# Patient Record
Sex: Female | Born: 1955 | Race: White | Hispanic: No | Marital: Married | Smoking: Former smoker
Health system: Southern US, Community
[De-identification: ages and names within clinical notes are randomized; demographics above are authoritative.]

## PROBLEM LIST (undated history)

## (undated) DIAGNOSIS — G459 Transient cerebral ischemic attack, unspecified: Secondary | ICD-10-CM

## (undated) DIAGNOSIS — K0401 Reversible pulpitis: Secondary | ICD-10-CM

## (undated) DIAGNOSIS — M359 Systemic involvement of connective tissue, unspecified: Secondary | ICD-10-CM

## (undated) DIAGNOSIS — Z9889 Other specified postprocedural states: Secondary | ICD-10-CM

## (undated) DIAGNOSIS — R42 Dizziness and giddiness: Secondary | ICD-10-CM

## (undated) DIAGNOSIS — M2142 Flat foot [pes planus] (acquired), left foot: Secondary | ICD-10-CM

## (undated) DIAGNOSIS — F319 Bipolar disorder, unspecified: Secondary | ICD-10-CM

## (undated) DIAGNOSIS — K7460 Unspecified cirrhosis of liver: Secondary | ICD-10-CM

## (undated) DIAGNOSIS — F32A Depression, unspecified: Secondary | ICD-10-CM

## (undated) DIAGNOSIS — K469 Unspecified abdominal hernia without obstruction or gangrene: Secondary | ICD-10-CM

## (undated) DIAGNOSIS — M1990 Unspecified osteoarthritis, unspecified site: Secondary | ICD-10-CM

## (undated) DIAGNOSIS — F1010 Alcohol abuse, uncomplicated: Secondary | ICD-10-CM

## (undated) DIAGNOSIS — K4090 Unilateral inguinal hernia, without obstruction or gangrene, not specified as recurrent: Secondary | ICD-10-CM

## (undated) DIAGNOSIS — I10 Essential (primary) hypertension: Secondary | ICD-10-CM

## (undated) DIAGNOSIS — D649 Anemia, unspecified: Secondary | ICD-10-CM

## (undated) DIAGNOSIS — K589 Irritable bowel syndrome without diarrhea: Secondary | ICD-10-CM

## (undated) DIAGNOSIS — K219 Gastro-esophageal reflux disease without esophagitis: Secondary | ICD-10-CM

## (undated) DIAGNOSIS — F410 Panic disorder [episodic paroxysmal anxiety] without agoraphobia: Secondary | ICD-10-CM

## (undated) DIAGNOSIS — R569 Unspecified convulsions: Secondary | ICD-10-CM

## (undated) DIAGNOSIS — I671 Cerebral aneurysm, nonruptured: Secondary | ICD-10-CM

## (undated) DIAGNOSIS — L4050 Arthropathic psoriasis, unspecified: Secondary | ICD-10-CM

## (undated) DIAGNOSIS — T8859XA Other complications of anesthesia, initial encounter: Secondary | ICD-10-CM

## (undated) DIAGNOSIS — M3500 Sicca syndrome, unspecified: Secondary | ICD-10-CM

## (undated) DIAGNOSIS — R5383 Other fatigue: Secondary | ICD-10-CM

## (undated) DIAGNOSIS — D500 Iron deficiency anemia secondary to blood loss (chronic): Secondary | ICD-10-CM

## (undated) DIAGNOSIS — E538 Deficiency of other specified B group vitamins: Secondary | ICD-10-CM

## (undated) DIAGNOSIS — R112 Nausea with vomiting, unspecified: Secondary | ICD-10-CM

## (undated) DIAGNOSIS — F419 Anxiety disorder, unspecified: Secondary | ICD-10-CM

## (undated) DIAGNOSIS — T4145XA Adverse effect of unspecified anesthetic, initial encounter: Secondary | ICD-10-CM

## (undated) DIAGNOSIS — S53106A Unspecified dislocation of unspecified ulnohumeral joint, initial encounter: Secondary | ICD-10-CM

## (undated) DIAGNOSIS — M7918 Myalgia, other site: Secondary | ICD-10-CM

## (undated) DIAGNOSIS — F329 Major depressive disorder, single episode, unspecified: Secondary | ICD-10-CM

## (undated) HISTORY — DX: Sjogren syndrome, unspecified: M35.00

## (undated) HISTORY — DX: Deficiency of other specified B group vitamins: E53.8

## (undated) HISTORY — DX: Major depressive disorder, single episode, unspecified: F32.9

## (undated) HISTORY — DX: Systemic involvement of connective tissue, unspecified: M35.9

## (undated) HISTORY — DX: Arthropathic psoriasis, unspecified: L40.50

## (undated) HISTORY — DX: Myalgia, other site: M79.18

## (undated) HISTORY — DX: Alcohol abuse, uncomplicated: F10.10

## (undated) HISTORY — DX: Reversible pulpitis: K04.01

## (undated) HISTORY — DX: Iron deficiency anemia secondary to blood loss (chronic): D50.0

## (undated) HISTORY — DX: Essential (primary) hypertension: I10

## (undated) HISTORY — DX: Anemia, unspecified: D64.9

## (undated) HISTORY — DX: Depression, unspecified: F32.A

## (undated) HISTORY — DX: Unspecified dislocation of unspecified ulnohumeral joint, initial encounter: S53.106A

## (undated) HISTORY — DX: Unilateral inguinal hernia, without obstruction or gangrene, not specified as recurrent: K40.90

## (undated) MED FILL — Iron Sucrose Inj 20 MG/ML (Fe Equiv): INTRAVENOUS | Qty: 15 | Status: AC

---

## 1995-03-31 DIAGNOSIS — R569 Unspecified convulsions: Secondary | ICD-10-CM

## 1995-03-31 HISTORY — DX: Unspecified convulsions: R56.9

## 1995-03-31 HISTORY — PX: CEREBRAL ANEURYSM REPAIR: SHX164

## 1997-08-20 ENCOUNTER — Ambulatory Visit (HOSPITAL_COMMUNITY): Admission: RE | Admit: 1997-08-20 | Discharge: 1997-08-20 | Payer: Self-pay | Admitting: Neurological Surgery

## 1998-08-08 ENCOUNTER — Ambulatory Visit (HOSPITAL_COMMUNITY): Admission: RE | Admit: 1998-08-08 | Discharge: 1998-08-08 | Payer: Self-pay | Admitting: Neurological Surgery

## 1998-08-08 ENCOUNTER — Encounter: Payer: Self-pay | Admitting: Neurological Surgery

## 2001-12-21 ENCOUNTER — Ambulatory Visit (HOSPITAL_COMMUNITY): Admission: RE | Admit: 2001-12-21 | Discharge: 2001-12-21 | Payer: Self-pay | Admitting: Neurological Surgery

## 2001-12-21 ENCOUNTER — Encounter: Payer: Self-pay | Admitting: Neurological Surgery

## 2002-01-18 ENCOUNTER — Inpatient Hospital Stay (HOSPITAL_COMMUNITY): Admission: RE | Admit: 2002-01-18 | Discharge: 2002-01-19 | Payer: Self-pay | Admitting: Interventional Radiology

## 2002-03-09 ENCOUNTER — Encounter: Payer: Self-pay | Admitting: Rheumatology

## 2002-03-09 ENCOUNTER — Ambulatory Visit (HOSPITAL_COMMUNITY): Admission: RE | Admit: 2002-03-09 | Discharge: 2002-03-09 | Payer: Self-pay | Admitting: Rheumatology

## 2002-04-27 ENCOUNTER — Ambulatory Visit (HOSPITAL_COMMUNITY): Admission: AD | Admit: 2002-04-27 | Discharge: 2002-04-27 | Payer: Self-pay | Admitting: Interventional Radiology

## 2002-11-08 ENCOUNTER — Encounter: Payer: Self-pay | Admitting: Family Medicine

## 2002-11-08 ENCOUNTER — Ambulatory Visit (HOSPITAL_COMMUNITY): Admission: RE | Admit: 2002-11-08 | Discharge: 2002-11-08 | Payer: Self-pay | Admitting: Family Medicine

## 2002-11-21 ENCOUNTER — Ambulatory Visit (HOSPITAL_COMMUNITY): Admission: RE | Admit: 2002-11-21 | Discharge: 2002-11-21 | Payer: Self-pay | Admitting: Family Medicine

## 2002-11-21 ENCOUNTER — Encounter: Payer: Self-pay | Admitting: Family Medicine

## 2002-11-27 ENCOUNTER — Ambulatory Visit (HOSPITAL_COMMUNITY): Admission: RE | Admit: 2002-11-27 | Discharge: 2002-11-27 | Payer: Self-pay | Admitting: Interventional Radiology

## 2002-11-30 ENCOUNTER — Ambulatory Visit (HOSPITAL_COMMUNITY): Admission: RE | Admit: 2002-11-30 | Discharge: 2002-11-30 | Payer: Self-pay | Admitting: Interventional Radiology

## 2002-12-01 ENCOUNTER — Ambulatory Visit (HOSPITAL_COMMUNITY): Admission: RE | Admit: 2002-12-01 | Discharge: 2002-12-01 | Payer: Self-pay | Admitting: Interventional Radiology

## 2002-12-26 ENCOUNTER — Ambulatory Visit (HOSPITAL_COMMUNITY): Admission: RE | Admit: 2002-12-26 | Discharge: 2002-12-27 | Payer: Self-pay | Admitting: Interventional Radiology

## 2003-04-06 ENCOUNTER — Ambulatory Visit (HOSPITAL_COMMUNITY): Admission: RE | Admit: 2003-04-06 | Discharge: 2003-04-06 | Payer: Self-pay | Admitting: Interventional Radiology

## 2003-10-10 ENCOUNTER — Ambulatory Visit (HOSPITAL_COMMUNITY): Admission: RE | Admit: 2003-10-10 | Discharge: 2003-10-10 | Payer: Self-pay | Admitting: Interventional Radiology

## 2004-06-12 ENCOUNTER — Ambulatory Visit (HOSPITAL_COMMUNITY): Admission: RE | Admit: 2004-06-12 | Discharge: 2004-06-12 | Payer: Self-pay | Admitting: Obstetrics & Gynecology

## 2004-11-17 ENCOUNTER — Encounter: Payer: Self-pay | Admitting: Interventional Radiology

## 2004-12-16 ENCOUNTER — Ambulatory Visit (HOSPITAL_COMMUNITY): Admission: RE | Admit: 2004-12-16 | Discharge: 2004-12-17 | Payer: Self-pay | Admitting: Interventional Radiology

## 2004-12-25 ENCOUNTER — Ambulatory Visit (HOSPITAL_COMMUNITY): Admission: RE | Admit: 2004-12-25 | Discharge: 2004-12-25 | Payer: Self-pay | Admitting: Interventional Radiology

## 2004-12-31 ENCOUNTER — Encounter: Payer: Self-pay | Admitting: Interventional Radiology

## 2005-11-13 ENCOUNTER — Encounter (INDEPENDENT_AMBULATORY_CARE_PROVIDER_SITE_OTHER): Payer: Self-pay | Admitting: *Deleted

## 2005-11-13 ENCOUNTER — Ambulatory Visit: Admission: RE | Admit: 2005-11-13 | Discharge: 2005-11-13 | Payer: Self-pay | Admitting: Family Medicine

## 2005-11-16 ENCOUNTER — Ambulatory Visit (HOSPITAL_COMMUNITY): Admission: RE | Admit: 2005-11-16 | Discharge: 2005-11-16 | Payer: Self-pay | Admitting: Interventional Radiology

## 2006-10-12 ENCOUNTER — Ambulatory Visit (HOSPITAL_COMMUNITY): Admission: RE | Admit: 2006-10-12 | Discharge: 2006-10-12 | Payer: Self-pay | Admitting: Family Medicine

## 2006-12-16 ENCOUNTER — Ambulatory Visit: Payer: Self-pay | Admitting: Orthopedic Surgery

## 2006-12-17 ENCOUNTER — Encounter: Payer: Self-pay | Admitting: Orthopedic Surgery

## 2007-02-02 ENCOUNTER — Encounter: Payer: Self-pay | Admitting: Orthopedic Surgery

## 2007-02-03 ENCOUNTER — Ambulatory Visit: Payer: Self-pay | Admitting: Orthopedic Surgery

## 2007-02-03 DIAGNOSIS — S53106A Unspecified dislocation of unspecified ulnohumeral joint, initial encounter: Secondary | ICD-10-CM

## 2007-02-03 DIAGNOSIS — M79609 Pain in unspecified limb: Secondary | ICD-10-CM | POA: Insufficient documentation

## 2007-02-03 HISTORY — DX: Unspecified dislocation of unspecified ulnohumeral joint, initial encounter: S53.106A

## 2007-02-16 ENCOUNTER — Encounter: Payer: Self-pay | Admitting: Orthopedic Surgery

## 2007-04-13 ENCOUNTER — Encounter: Payer: Self-pay | Admitting: Orthopedic Surgery

## 2007-08-01 ENCOUNTER — Other Ambulatory Visit: Admission: RE | Admit: 2007-08-01 | Discharge: 2007-08-01 | Payer: Self-pay | Admitting: Obstetrics & Gynecology

## 2007-08-03 ENCOUNTER — Ambulatory Visit (HOSPITAL_COMMUNITY): Admission: RE | Admit: 2007-08-03 | Discharge: 2007-08-03 | Payer: Self-pay | Admitting: Obstetrics & Gynecology

## 2007-08-09 ENCOUNTER — Encounter: Payer: Self-pay | Admitting: Orthopedic Surgery

## 2009-01-07 ENCOUNTER — Other Ambulatory Visit: Admission: RE | Admit: 2009-01-07 | Discharge: 2009-01-07 | Payer: Self-pay | Admitting: Obstetrics & Gynecology

## 2009-01-14 ENCOUNTER — Ambulatory Visit (HOSPITAL_COMMUNITY): Admission: RE | Admit: 2009-01-14 | Discharge: 2009-01-14 | Payer: Self-pay | Admitting: Obstetrics & Gynecology

## 2010-01-11 ENCOUNTER — Emergency Department (HOSPITAL_COMMUNITY): Admission: EM | Admit: 2010-01-11 | Discharge: 2010-01-11 | Payer: Self-pay | Admitting: Emergency Medicine

## 2010-01-11 ENCOUNTER — Encounter: Payer: Self-pay | Admitting: Orthopedic Surgery

## 2010-01-14 ENCOUNTER — Ambulatory Visit: Payer: Self-pay | Admitting: Orthopedic Surgery

## 2010-01-14 DIAGNOSIS — S92309A Fracture of unspecified metatarsal bone(s), unspecified foot, initial encounter for closed fracture: Secondary | ICD-10-CM | POA: Insufficient documentation

## 2010-01-16 ENCOUNTER — Encounter: Payer: Self-pay | Admitting: Orthopedic Surgery

## 2010-02-26 ENCOUNTER — Ambulatory Visit: Payer: Self-pay | Admitting: Orthopedic Surgery

## 2010-03-04 ENCOUNTER — Ambulatory Visit (HOSPITAL_COMMUNITY)
Admission: RE | Admit: 2010-03-04 | Discharge: 2010-03-04 | Payer: Self-pay | Source: Home / Self Care | Admitting: Rheumatology

## 2010-03-18 ENCOUNTER — Other Ambulatory Visit
Admission: RE | Admit: 2010-03-18 | Discharge: 2010-03-18 | Payer: Self-pay | Source: Home / Self Care | Admitting: Obstetrics & Gynecology

## 2010-03-20 ENCOUNTER — Ambulatory Visit (HOSPITAL_COMMUNITY)
Admission: RE | Admit: 2010-03-20 | Discharge: 2010-03-20 | Payer: Self-pay | Source: Home / Self Care | Attending: Obstetrics & Gynecology | Admitting: Obstetrics & Gynecology

## 2010-04-20 ENCOUNTER — Encounter: Payer: Self-pay | Admitting: Internal Medicine

## 2010-05-01 NOTE — Letter (Signed)
Summary: History form  History form   Imported By: Jacklynn Ganong 01/16/2010 08:15:56  _____________________________________________________________________  External Attachment:    Type:   Image     Comment:   External Document

## 2010-05-01 NOTE — Assessment & Plan Note (Signed)
Summary: NEW PROB/AP ER/FOL/UP/FX RT FOOT,METATARSAL/XRAY [facility] 01/11/10/...   Visit Type:  new problem  CC:  right foot pain.  History of Present Illness: I saw Deanna Thompson in the office today for a  visit.  She is a 55 years old woman with the complaint of:  right foot pain after fall 01/10/10.  Right foot xrays [facility] 01/11/10.  Meds: Metoprolol, Lexapro, Verapamil, Enbrel, Claratin, Ibuprofen 400 as needed helps.  No meds given from er.  55 year old female fell on the 14th went to the hospital for x-rays on the 15th and shows a nondisplaced proximal fifth metatarsal fracture she does complain of some moderate to severe pain, swelling, difficulty ambulating and some pain that radiates proximal and distal to the fracture site.  She was treated with a temporary postop shoe and says that she is still having some discomfort     Allergies: 1)  ! Keflex 2)  ! Dilantin  Past History:  Past Medical History: tendonitis hypertension seasonal allergies psoriatic arthritis PTSD  Family History: Hx, family, asthma  Social History: Patient is married.  retired no smoking some alcohol use no caffeine Masters Degree  Review of Systems Constitutional:  Denies weight loss, weight gain, fever, chills, and fatigue. Cardiovascular:  Denies chest pain, palpitations, fainting, and murmurs. Respiratory:  Complains of snoring; denies short of breath, wheezing, couch, tightness, pain on inspiration, and snoring . Gastrointestinal:  Complains of heartburn; denies nausea, vomiting, diarrhea, constipation, and blood in your stools. Genitourinary:  Complains of urgency; denies frequency, difficulty urinating, painful urination, flank pain, and bleeding in urine. Neurologic:  Denies numbness, tingling, unsteady gait, dizziness, tremors, and seizure. Musculoskeletal:  Complains of joint pain, swelling, and muscle pain; denies instability, stiffness, redness, and heat. Endocrine:  Denies  excessive thirst, exessive urination, and heat or cold intolerance. Psychiatric:  Complains of depression; denies nervousness, anxiety, and hallucinations. Skin:  Denies changes in the skin, poor healing, rash, itching, and redness; psoriasis. HEENT:  Denies blurred or double vision, eye pain, redness, and watering. Immunology:  Complains of seasonal allergies; denies sinus problems and allergic to bee stings. Hemoatologic:  Denies easy bleeding and brusing.  Physical Exam  Additional Exam:  GEN: well developed, well nourished, normal grooming and hygiene, no deformity and normal body habitus.   CDV: pulses are normal, no edema, no erythema. no tenderness  Lymph: normal lymph nodes   Skin: no rashes, skin lesions or open sores   NEURO: normal coordination, reflexes, sensation.   Psyche: awake, alert and oriented. Mood normal   Gait: visit.  She is walking with a postop shoe but she is limping  Inspection shows that she does have tenderness at the proximal fifth metatarsal tarsal with a modest amount of swelling.  She has normal range of motion in her ankle.  No strength deficits are detected and the joints of the ankle and foot are stable       Impression & Recommendations:  Problem # 1:  CLOSED FRACTURE OF METATARSAL BONE (ICD-825.25) Assessment New  hospital film reviewed on the computer nondisplaced fifth metatarsal fracture at the proximal and questionable at the [place] area.  Recommend Cam Walker  Orders: Est. Patient Level IV (95284) Metatarsal Fx (13244)  Patient Instructions: 1)  Please schedule a follow-up appointment in 6 weeks 2)  xrays rt foot    Orders Added: 1)  Est. Patient Level IV [01027] 2)  Metatarsal Fx [25366]

## 2010-05-01 NOTE — Assessment & Plan Note (Signed)
Summary: 6 WK RE-CK/XRAY RT FOOT/FX CARE/UHC/CAF   Visit Type:  Follow-up Referring Provider:  [facility]  CC:  right foot fracture.  History of Present Illness: I saw Deanna Thompson in the office today for a 6 week  followup visit.  She is a 55 years old woman with the complaint of:  right foot fracture.  Xrays today.  Right foot xrays [facility] 01/11/10.  Meds: Metoprolol, Lexapro, Verapamil, Enbrel, Claratin, Ibuprofen 400 as needed helps.  She is now approximately 6 weeks status post treatment with the Cam Walker for a 5th metacarpal. Proximal fracture of the 5th metatarsal bone complains of some mild swelling in tenderness.  she does complain of some soreness in the foot, with some swelling at the end of the day, which eventually goes down, which is elevated. The foot.  X-rays 3 views, RIGHT foot, proximal 5th metatarsal fracture has healed with good callus formation around it in normal alignment.  Impression healed RIGHT 5th metatarsal fracture.  Plan the patient can remove the boot and resume her activities in a progressive manner. She is to call if there are any problems  Allergies: 1)  ! Keflex 2)  ! Dilantin   Impression & Recommendations:  Problem # 1:  CLOSED FRACTURE OF METATARSAL BONE (ICD-825.25) Assessment Improved  Orders: Post-Op Check (16109) Foot x-ray complete, minimum 3 views (60454)  Patient Instructions: 1)  Take boot off 2)  will be sore for a week or 2 3)  come back as needed   Orders Added: 1)  Post-Op Check [99024] 2)  Foot x-ray complete, minimum 3 views [73630]

## 2010-08-15 NOTE — Discharge Summary (Signed)
NAMECATHELINE, Thompson            ACCOUNT NO.:  [id number]   MEDICAL RECORD NO.:  [id number]          PATIENT TYPE:  OIB   LOCATION:  3108                         FACILITY:  [facility]   PHYSICIAN:  Sanjeev K. Deveshwar, M.D.DATE OF BIRTH:  12-28-1955   DATE OF ADMISSION:  12/16/2004  DATE OF DISCHARGE:  12/17/2004                           DISCHARGE SUMMARY - REFERRING   BRIEF HISTORY:  This is a pleasant 55 year old female with a history of a  basilar artery apex aneurysm that was initially treated by Dr. Danielle Dess with  clipping in 1997.  The patient later went to [facility] in  [place] for a coiling of the aneurysm.  She subsequently had recoiling of the  aneurysm performed by Dr. Corliss Skains in October of 2003 and again in  September of 2004.  Her most recent angiogram was April 06, 2003.  At that  time there was no evidence of cannulization of the aneurysm.  The patient  was noted to have another aneurysm approximately 2.5 x 3 mm in left middle  cerebral artery.  The patient returned to [facility] on  December 16, 2004, for a cerebral angiogram with possible coiling of the  aneurysm.  She has essentially been asymptomatic.   PAST MEDICAL HISTORY:  1.  Hypertension.  2.  Irritable bowel syndrome.  3.  History of migraine headaches.  4.  She had a question of a TIA or possible CVA in December.  She was      started on aspirin and Plavix at that time.   PAST SURGICAL HISTORY:  Please see the above noted aneurysm clipping in  1997.   ALLERGIES:  Patient is allergic to [facility] which causes yeast infections.  She had a rash with DILANTIN therapy.  She had projectile vomiting with some  type of ANESTHESIA in the past.  She has been treated with scopolamine  patches with good results.   MEDICATIONS ON ADMISSION:  1.  Lexapro 10 mg daily.  2.  Verapamil 240 mg daily.  3.  Toprol XL 100 mg daily.  4.  Nasonex nasal spray daily.  5.  Claritin  over-the-counter daily.  6.  Tylenol p.r.n.  7.  FiberTabs daily.  8.  She was given aspirin and Plavix on the day of the procedure.   SOCIAL HISTORY:  Patient is married.  She has no children.  She lives in  [place] with her husband.  She quit smoking in 1997.  She smoked  approximately two packs of cigarettes per day for 20 years.  She drinks two  to three glasses of wine per day.  She works for [employer]  in [employer].  She also works part time as a Sport and exercise psychologist.   FAMILY HISTORY:  Patient's mother is alive at age 49.  She has end-stage  cirrhosis and aortic stenosis.  Her father is alive at age 33.  He had an MI  approximately two weeks ago.  He also has a long history of tobacco use.   HOSPITAL COURSE:  As noted, this patient was admitted to [facility] on December 16, 2004, with a known cerebral aneurysm with  plans for a cerebral angiogram and possible coiling of the aneurysm.  The  angiogram was performed on the day of admission.  Unfortunately, the  aneurysm was found to involve another cerebral artery.  Endovascular  treatment of the aneurysm would possibly cause disruption of the flow to the  involved artery and therefore a decision was made not to proceed.  The  patient will be managed conservatively.   Toward the end of the procedure, the patient had TIA-like symptoms at which  time she had difficulty with her speech.  This was a very brief episode.  Dr. Corliss Skains was present during these symptoms and in approximately two to  three minutes she was back to baseline.  A decision was made to admit the  patient for IV heparin therapy overnight with plans for discharge the  following day.   The patient did received IV heparin overnight.  She was admitted to the  hospital.  The following morning, the heparin was discontinued.  The sheath  was removed from her right groin. At this time the plan is to discharge her   early this evening if she remains medically stable.   LABORATORY DATA:  A chest x-ray on December 10, 2004, was unremarkable.  A  CBC on December 17, 2004, revealed hemoglobin 14.3, hematocrit 41.8, wbc  7.6000, platelets 197,000.  A chemistry profile on December 17, 2004,  revealed a BUN of 8, creatinine 0.7, potassium 3.7, glucose 123.  Coags were  within normal limits at the time of admission.   DISCHARGE INSTRUCTIONS:  The patient was told to resume her home medications  as taken prior to the procedure. She will be on Plavix 75 mg daily for four  weeks.  She would be on aspirin one daily indefinitely.   The patient was told to stay on low cholesterol, low sodium diet.  She was  told to avoid sweets due to elevated glucose levels during this stay.   The plan at this time will be to perform a CT scan in approximately two  weeks. She will have a CT angiogram in one year.  She will follow up with  Dr. Corliss Skains on Wednesday, December 31, 2004, at 2 p.m.  She will follow up  with [facility] as needed.   PROBLEM LIST:  1.  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  Unable to      coil due to occluding associated artery.  2.  History of basilar artery apex aneurysm that has previously been clipped      as well as coiled.  3.  Mildly elevated glucose levels question borderline diabetes.  4.  Hypertension.  5.  Irritable bowel syndrome.  6.  History of migraine headaches.  7.  Question of a transient ischemic attack or cerebrovascular accident in      December.  8.  Transient ischemic attack directly following the cerebral angiogram on      December 16, 2004.  9.  Remote tobacco history.      Delton See, P.A.    ______________________________  Grandville Silos. Corliss Skains, M.D.    DR/MEDQ  D:  12/17/2004  T:  12/17/2004  Job:  [id number]   cc:   Stefani Dama, M.D.  [address]  [place]  [place] [address] Fax: [phone]   [facility]  [place], [place]

## 2010-08-15 NOTE — Consult Note (Signed)
Deanna Thompson, Deanna Thompson            ACCOUNT NO.:  [id number]   MEDICAL RECORD NO.:  [id number]          PATIENT TYPE:  OUT   LOCATION:  XRAY                         FACILITY:  [facility]   PHYSICIAN:  Sanjeev K. Deveshwar, M.D.DATE OF BIRTH:  [birth year]   DATE OF CONSULTATION:  11/17/2004  DATE OF DISCHARGE:                                   CONSULTATION   CHIEF COMPLAINT:  History of aneurysms.   HISTORY OF PRESENT ILLNESS:  This is a 55 year old female with a history of  a basilar artery apex aneurysm that was initially treated by clipping  performed by Dr. Danielle Dess in 1997. The patient later went to [facility] in [place] for a coiling of the aneurysm. She subsequently had  recoiling of the aneurysm performed by Dr. Corliss Skains in October of 2003 and  September of 2004. Her last angiogram was April 06, 2003 and there was no  evidence of recannulization at that time. The patient also has another  aneurysm that needs to be evaluated. The patient is seen in consultation  today by Dr. Corliss Skains for discussions regarding this other aneurysm which  apparently is a 2.5 x 3 mm aneurysm in the left middle cerebral artery. It  has been stable to this point.   PAST MEDICAL HISTORY:  1.  Significant for hypertension.  2.  She has irritable bowel syndrome.  3.  She has a history of migraine headaches.  4.  There is a question of a TIA or possible CVA back in December. She was      treated with aspirin and Plavix at that time. She has had no further      symptoms.   SURGICAL HISTORY:  Significant for the above-noted aneurysm clipping in  1997.   ALLERGIES:  The patient is allergic to [facility] which causes severe yeast  infections. She had a rash with DILANTIN therapy. She has had projectile  vomiting with some type of ANESTHESIA in the past. This has been treated  with scopolamine patches with good results.   CURRENT MEDICATIONS:  1.  Lexapro 10 milligrams daily.  2.  Toprol XL 100  milligrams daily.  3.  Verapamil 240 milligrams daily.  4.  Claritin and Nasonex for allergies.   SOCIAL HISTORY:  This patient is married. They have no children. She lives  in [place] with her husband. She quit smoking in 1997. She smoked  approximately two packs per day for 20 years. She drinks two to three  glasses of wine per day. She works for [employer] in [employer]. She also works part-time as a Proofreader groups.   FAMILY HISTORY:  The patient's mother is alive at age 42. She does have end-  stage cirrhosis and aortic stenosis. Her father is alive at age 72. He had  an MI approximately two weeks ago. He also has a long history of tobacco  use.   IMPRESSION:  As noted, the patient has a history of a previous basilar apex  aneurysm that was treated with both clipping and multiple coilings. She  is  now to be evaluated for another aneurysm in the left middle cerebral artery  distribution. This artery may be able to be treated with primary coiling or  it may require Aneuroform stenting. There is a possibility that it may also  be able to be treated with stenting alone. The patient is to return  December 16, 2004 for a repeat cerebral angiogram and possible intervention  for this aneurysm. Risks and benefits were discussed with the patient and  her husband in great detail and she wishes to proceed.   Greater than 40 minutes was spent on this consult today.      Delton See, P.A.    ______________________________  Grandville Silos. Corliss Skains, M.D.    DR/MEDQ  D:  11/17/2004  T:  11/17/2004  Job:  [id number]   cc:   Stefani Dama, M.D.  [address]  [place]  [place] [address]  Fax: [phone]   [facility]  [place], [place]

## 2011-01-22 ENCOUNTER — Emergency Department (HOSPITAL_COMMUNITY)
Admission: EM | Admit: 2011-01-22 | Discharge: 2011-01-22 | Disposition: A | Discharge status: Home / Self Care | Payer: BC Managed Care – PPO | Attending: Emergency Medicine | Admitting: Emergency Medicine

## 2011-01-22 ENCOUNTER — Other Ambulatory Visit: Payer: Self-pay

## 2011-01-22 ENCOUNTER — Emergency Department (HOSPITAL_COMMUNITY): Payer: BC Managed Care – PPO

## 2011-01-22 ENCOUNTER — Encounter: Payer: Self-pay | Admitting: Emergency Medicine

## 2011-01-22 DIAGNOSIS — Z79899 Other long term (current) drug therapy: Secondary | ICD-10-CM | POA: Insufficient documentation

## 2011-01-22 DIAGNOSIS — F410 Panic disorder [episodic paroxysmal anxiety] without agoraphobia: Secondary | ICD-10-CM | POA: Insufficient documentation

## 2011-01-22 DIAGNOSIS — R079 Chest pain, unspecified: Secondary | ICD-10-CM | POA: Insufficient documentation

## 2011-01-22 DIAGNOSIS — R61 Generalized hyperhidrosis: Secondary | ICD-10-CM | POA: Insufficient documentation

## 2011-01-22 DIAGNOSIS — M79609 Pain in unspecified limb: Secondary | ICD-10-CM | POA: Insufficient documentation

## 2011-01-22 HISTORY — DX: Cerebral aneurysm, nonruptured: I67.1

## 2011-01-22 HISTORY — DX: Irritable bowel syndrome, unspecified: K58.9

## 2011-01-22 HISTORY — DX: Anxiety disorder, unspecified: F41.9

## 2011-01-22 HISTORY — DX: Panic disorder (episodic paroxysmal anxiety): F41.0

## 2011-01-22 HISTORY — DX: Gastro-esophageal reflux disease without esophagitis: K21.9

## 2011-01-22 LAB — CBC
Platelets: 123 10*3/uL — ABNORMAL LOW (ref 150–400)
RBC: 3.8 MIL/uL — ABNORMAL LOW (ref 3.87–5.11)
WBC: 7.1 10*3/uL (ref 4.0–10.5)

## 2011-01-22 LAB — BASIC METABOLIC PANEL
CO2: 18 mEq/L — ABNORMAL LOW (ref 19–32)
Chloride: 102 mEq/L (ref 96–112)
Creatinine, Ser: 0.72 mg/dL (ref 0.50–1.10)
GFR calc Af Amer: >90 mL/min (ref >90)
GFR calc non Af Amer: >90 mL/min (ref >90)
Glucose, Bld: 105 mg/dL — ABNORMAL HIGH (ref 70–99)
Potassium: 4.1 mEq/L (ref 3.5–5.1)

## 2011-01-22 MED ORDER — ASPIRIN 81 MG PO CHEW
324.0000 mg | CHEWABLE_TABLET | Freq: Once | ORAL | Status: AC
  Administered 2011-01-22: 324 mg via ORAL
  Filled 2011-01-22: qty 4

## 2011-01-22 MED ORDER — NITROGLYCERIN 0.4 MG SL SUBL: 0.4000 mg | SUBLINGUAL_TABLET | SUBLINGUAL | Status: DC | PRN

## 2011-01-22 NOTE — ED Provider Notes (Signed)
History    Scribed for Deanna Bonier, MD, the patient was seen in room APA01/APA01. This chart was scribed by [name].   CSN: [id number] Arrival date & time: 01/22/2011  5:26 PM   First MD Initiated Contact with Patient 01/22/11 1753      Chief Complaint  Patient presents with  . Excessive Sweating  . Chest Pain  . Arm Pain    (Consider location/radiation/quality/duration/timing/severity/associated sxs/prior treatment) HPI [place] is a 55 y.o. female who presents to the Emergency Department complaining of sudden onset of profuse diaphoresis 3:30 AM with associated "exteremely heavy" in bilateral UE, upper chest tightness and throat tightness.  Patient reports similar sx with panic attacks. Sx have resolved.  Episode lasted about 15 minutes.  Patient was resting when sx began.  There is no SOB, nausea and vomiting associated with this episode.  There is no hx of heart disease, DM and earlier MI in family history.  Patient stopped smoking 14 years ago.  Patient has hx of panic attacks, HTN, IBS and GERD.     PCP [name]., MD  Past Medical History  Diagnosis Date  . Brain aneurysm   . IBS (irritable bowel syndrome)   . GERD (gastroesophageal reflux disease)   . Panic attack   . Anxiety     History reviewed. No pertinent past surgical history.  History reviewed. No pertinent family history.  History  Substance Use Topics  . Smoking status: Never Smoker   . Smokeless tobacco: Not on file  . Alcohol Use: Yes    OB History    Grav Para Term Preterm Abortions TAB SAB Ect Mult Living                  Review of Systems 10 Systems reviewed and are negative for acute change except as noted in the HPI.  Allergies  Phenytoin and Cephalexin  Home Medications   Current Outpatient Rx  Name Route Sig Dispense Refill  . HUMIRA Elfers Subcutaneous Inject 1 each into the skin every 14 (fourteen) days.      . ESCITALOPRAM OXALATE 10 MG PO TABS Oral Take  10 mg by mouth daily.      Marland Kitchen METOPROLOL SUCCINATE 100 MG PO TB24 Oral Take 100 mg by mouth daily.      . MOMETASONE FUROATE 50 MCG/ACT NA SUSP Nasal Place 2 sprays into the nose daily.      . OLOPATADINE HCL 0.6 % NA SOLN Nasal Place 1 each into the nose 2 (two) times daily.      Marland Kitchen OMEPRAZOLE 40 MG PO CPDR Oral Take 40 mg by mouth daily.      [name] SULFATE 0.05-0.25 % OP SOLN Both Eyes Place 2 drops into both eyes daily as needed. For redness     . VERAPAMIL HCL 240 MG (CO) PO TB24 Oral Take 240 mg by mouth daily.      Marland Kitchen CLOBETASOL PROPIONATE 0.05 % EX CREA Topical Apply 1 application topically as needed. For exczema       BP 119/93  Pulse 62  Temp(Src) 98.5 F (36.9 C) (Oral)  Resp 20  Ht 5\' 9"  (1.753 m)  Wt 265 lb (120.203 kg)  BMI 39.13 kg/m2  SpO2 97%  Physical Exam  Constitutional: She is oriented to person, place, and time. She appears well-developed and well-nourished. No distress.  HENT:  Head: Normocephalic and atraumatic.  Mouth/Throat: Mucous membranes are normal. Mucous membranes are not dry.  Eyes:  EOM are normal. Pupils are equal, round, and reactive to light.  Neck: No JVD present. No tracheal deviation present.  Cardiovascular: Normal rate, regular rhythm, S1 normal, S2 normal and normal heart sounds.  Exam reveals no gallop and no friction rub.   No murmur heard. Pulmonary/Chest: Effort normal and breath sounds normal. No respiratory distress. She has no wheezes. She has no rales.  Abdominal: Soft. Bowel sounds are normal. There is no tenderness. There is no rebound and no guarding.  Musculoskeletal: Normal range of motion. She exhibits no edema (LE).  Neurological: She is alert and oriented to person, place, and time.  Skin: Skin is warm, dry and intact. She is not diaphoretic.  Psychiatric: She has a normal mood and affect. Her behavior is normal.    ED Course  Procedures (including critical care time)   DIAGNOSTIC STUDIES: Oxygen  Saturation is 97% on room air, normal by my interpretation.    EKG:    Date: 01/22/2011  Rate: 63  Rhythm: Sinus rythm with first degree AV block  QRS Axis: normal  Intervals: PR prolonged  ST/T Wave abnormalities: normal  Conduction Disutrbances:first-degree A-V block   Narrative Interpretation:   Old EKG Reviewed: none available   COORDINATION OF CARE:   LABS / RADIOLOGY:   Labs Reviewed  CBC - Abnormal; Notable for the following:    RBC 3.80 (*)    MCV 107.4 (*)    MCH 35.8 (*)    Platelets 123 (*)    All other components within normal limits  BASIC METABOLIC PANEL - Abnormal; Notable for the following:    CO2 18 (*)    Glucose, Bld 105 (*)    All other components within normal limits  CARDIAC PANEL(CRET KIN+CKTOT+MB+TROPI)   Results for orders placed during the hospital encounter of 01/22/11  CBC      Component Value Range   WBC 7.1  4.0 - 10.5 (K/uL)   RBC 3.80 (*) 3.87 - 5.11 (MIL/uL)   Hemoglobin 13.6  12.0 - 15.0 (g/dL)   HCT 16.1  09.6 - 04.5 (%)   MCV 107.4 (*) 78.0 - 100.0 (fL)   MCH 35.8 (*) 26.0 - 34.0 (pg)   MCHC 33.3  30.0 - 36.0 (g/dL)   RDW 40.9  81.1 - 91.4 (%)   Platelets 123 (*) 150 - 400 (K/uL)  BASIC METABOLIC PANEL      Component Value Range   Sodium 136  135 - 145 (mEq/L)   Potassium 4.1  3.5 - 5.1 (mEq/L)   Chloride 102  96 - 112 (mEq/L)   CO2 18 (*) 19 - 32 (mEq/L)   Glucose, Bld 105 (*) 70 - 99 (mg/dL)   BUN 12  6 - 23 (mg/dL)   Creatinine, Ser 7.82  0.50 - 1.10 (mg/dL)   Calcium 9.4  8.4 - 95.6 (mg/dL)   GFR calc non Af Amer >90  >90 (mL/min)   GFR calc Af Amer >90  >90 (mL/min)  CARDIAC PANEL(CRET KIN+CKTOT+MB+TROPI)      Component Value Range   Total CK 62  7 - 177 (U/L)   CK, MB 1.9  0.3 - 4.0 (ng/mL)   Troponin I <0.30  <0.30 (ng/mL)   Relative Index RELATIVE INDEX IS INVALID  0.0 - 2.5    Dg Chest Portable 1 View  01/22/2011  *RADIOLOGY REPORT*  Clinical Data: Bilateral upper extremity numbness and tightness.  PORTABLE  CHEST - 1 VIEW  Comparison: 12/10/2004.  Findings: Mildly degraded exam due  to AP portable technique and patient body habitus.  Apical lordotic patient positioning on the frontal.  Patient rotated right. Cardiomegaly accentuated by AP portable technique. No pleural effusion or pneumothorax.  Clear lungs.  IMPRESSION: Borderline cardiomegaly, without acute disease.  Original Report Authenticated By: Consuello Bossier, M.D.            MDM   MDM:      MEDICATIONS GIVEN IN THE E.D. Scheduled Meds:    . aspirin  324 mg Oral Once   Continuous Infusions:    DDX: Panic attack, gastric reflux, arrhthymia, MS chest pain, PNA, and less likely ACS are considered etiologies for her sx.   Patient has hx of panic attacks and felt like it was a panic attack .  We will make sure it was not cardiac related.  EKG, cardiac enzymes, blood work, and chest x-ray are all without acute findings to suggest a serious cause of the patient's primary symptoms of chest tightness and anxiety beyond anxiety attack. Her symptoms are atypical as they began at rest and are not worsened by exertion as well as in their character. I do not think that this was a myocardial event causing her chest discomfort.  IMPRESSION: No diagnosis found.   DISCHARGE MEDICATIONS: New Prescriptions   No medications on file      I personally performed the services described in this documentation, which was scribed in my presence. The recorded information has been reviewed and considered.              Deanna Bonier, MD 01/22/11 [phone]

## 2011-01-22 NOTE — ED Notes (Signed)
Pt c/o rt shoulder and chest and arm heaviness about one hour ago.

## 2011-07-29 ENCOUNTER — Ambulatory Visit: Payer: BC Managed Care – PPO | Admitting: Orthopedic Surgery

## 2011-07-30 LAB — COMPREHENSIVE METABOLIC PANEL
ALT: 59 U/L — AB (ref 7–35)
AST: 171 U/L
Albumin: 3
Alkaline Phosphatase: 111 U/L
Creat: 0.64
Total Bilirubin: 3.6 mg/dL

## 2011-07-30 LAB — HEPATITIS B SURFACE ANTIGEN
Hep A IgM: NEGATIVE
Hep B C IgM: NEGATIVE
Hepatitis B Surface Antigen: NEGATIVE

## 2011-07-30 LAB — CBC WITH DIFFERENTIAL/PLATELET
HCT: 41 %
TSH: 1.6 u[IU]/mL (ref 0.41–5.90)

## 2011-07-30 LAB — FECAL LACTOFERRIN, QUANT
Clostridium DiffCult: NEGATIVE
Lactoferrin: POSITIVE

## 2011-08-04 ENCOUNTER — Encounter: Payer: Self-pay | Admitting: Gastroenterology

## 2011-08-10 ENCOUNTER — Ambulatory Visit (INDEPENDENT_AMBULATORY_CARE_PROVIDER_SITE_OTHER): Payer: BC Managed Care – PPO | Admitting: Gastroenterology

## 2011-08-10 ENCOUNTER — Encounter: Payer: Self-pay | Admitting: Gastroenterology

## 2011-08-10 VITALS — BP 120/67 | HR 66 | Temp 98.1°F | Ht 68.0 in | Wt 269.0 lb

## 2011-08-10 DIAGNOSIS — D696 Thrombocytopenia, unspecified: Secondary | ICD-10-CM

## 2011-08-10 DIAGNOSIS — R600 Localized edema: Secondary | ICD-10-CM

## 2011-08-10 DIAGNOSIS — R945 Abnormal results of liver function studies: Secondary | ICD-10-CM

## 2011-08-10 DIAGNOSIS — D7589 Other specified diseases of blood and blood-forming organs: Secondary | ICD-10-CM

## 2011-08-10 DIAGNOSIS — K7010 Alcoholic hepatitis without ascites: Secondary | ICD-10-CM | POA: Insufficient documentation

## 2011-08-10 DIAGNOSIS — R609 Edema, unspecified: Secondary | ICD-10-CM

## 2011-08-10 DIAGNOSIS — R17 Unspecified jaundice: Secondary | ICD-10-CM

## 2011-08-10 DIAGNOSIS — R197 Diarrhea, unspecified: Secondary | ICD-10-CM

## 2011-08-10 DIAGNOSIS — R7989 Other specified abnormal findings of blood chemistry: Secondary | ICD-10-CM

## 2011-08-10 NOTE — Progress Notes (Signed)
Faxed to PCP

## 2011-08-10 NOTE — Assessment & Plan Note (Signed)
Recent change in bowels with persistent diarrhea with prior h/o IBS. Symptoms began with fever, n/v. DDX: includes post-infectious IBS, persistent infectious colitis, etoh related colopathy, microscopic colitis, IBD. She has had stool studies done. I was only able to obtain CDIFF, O+P, lactoferrin results. Lactoferrin positive. Stool culture results unable to obtain, ?done? She will need colonoscopy in near future in OR (given h/o etoh abuse) but we need to wait to see if EGD will be needed given possibility of cirrhosis. Further recommendations to follow.

## 2011-08-10 NOTE — Progress Notes (Signed)
Please let patient know: I reviewed prior labs and u/s from 01/2009.   She still needs labs as ordered.  She needs abd u/s. Once abd u/s done, then we will decide whether she needs colonoscopy only or add EGD.

## 2011-08-10 NOTE — Assessment & Plan Note (Signed)
Recommend two gram sodium diet. Consider diuretics in near future.

## 2011-08-10 NOTE — Progress Notes (Signed)
Primary Care Physician:  [name]., MD, MD  Primary Gastroenterologist:    Chief Complaint  Patient presents with  . Abdominal Pain    change in bowels  . Nausea    HPI:  Deanna Thompson is a 56 y.o. female here at request of PCP for further evaluation of bowel changes and abdominal pain. Several week h/o severe watery, nonbloody diarrhea. Initially started out with vomiting and low-grade fever, but these symptoms have resolved. Diarrhea never went away. Still having watery stools but not as explosive. Episodes of wretching ?secondary to PND and horrible gag reflex. Takes Lomotil three times a day. Last abd u/s 18 months ago done at [facility]. ENT diagnosed with GERD three months ago, patient not currently taking her omeprazole. Lots of post-nasal drip. IBS, typical pattern would be alternating regular BM, diarrhea, followed by constipation. Told she had IBS years ago but not evaluated. No melena. Little brbpr on toilet tissue. No prior colonoscopy or EGD.   Current Outpatient Prescriptions  Medication Sig Dispense Refill  . escitalopram (LEXAPRO) 10 MG tablet Take 10 mg by mouth daily.        . metoprolol (TOPROL-XL) 100 MG 24 hr tablet Take 100 mg by mouth daily.        . Olopatadine HCl (PATANASE) 0.6 % SOLN Place 1 each into the nose 2 (two) times daily.    Not taking    . Probiotic Product (PROBIOTIC PO) Take by mouth.      . ranitidine (ZANTAC) 300 MG tablet Take 300 mg by mouth 2 (two) times daily.       . verapamil (COVERA HS) 240 MG (CO) 24 hr tablet Take 240 mg by mouth daily.        . Adalimumab (HUMIRA Anderson) Inject 1 each into the skin every 14 (fourteen) days.   Not taking    . clobetasol (TEMOVATE) 0.05 % cream Apply 1 application topically as needed. For exczema  Not taking    . mometasone (NASONEX) 50 MCG/ACT nasal spray Place 2 sprays into the nose daily.        Marland Kitchen omeprazole (PRILOSEC) 40 MG capsule Take 40 mg by mouth daily.   Not taking    . tetrahydrozoline-zinc  (VISINE-AC) 0.05-0.25 % ophthalmic solution Place 2 drops into both eyes daily as needed. For redness  Not taking      Allergies as of 08/10/2011 - Review Complete 08/10/2011  Allergen Reaction Noted  . Phenytoin Rash   . Cephalexin Other (See Comments)    Past Medical History  Diagnosis Date  . Brain aneurysm   . IBS (irritable bowel syndrome)   . GERD (gastroesophageal reflux disease)   . Panic attack   . Anxiety   . HTN (hypertension)   . Psoriatic arthritis   . ETOH abuse   . Sjogren's disease       Past Surgical History  Procedure Date  . Cerebral aneurysm repair    Family History  Problem Relation Age of Onset  . Cirrhosis Mother     nash  . Colon cancer Neg Hx   . Inflammatory bowel disease Neg Hx    History   Social History  . Marital Status: Married    Spouse Name: N/A    Number of Children: 0  . Years of Education: N/A   Occupational History  .     Social History Main Topics  . Smoking status: Former Games developer  . Smokeless tobacco: None   Comment: none since  after aneurysm repair  . Alcohol Use: Yes     bottle of wine a day for years  . Drug Use: No  . Sexually Active: None   Other Topics Concern  . None   Social History Narrative  . None     ROS:  General: Negative for anorexia, weight loss, fever, chills, fatigue, weakness. Eyes: Negative for vision changes.  ENT: Negative for hoarseness, difficulty swallowing , nasal congestion.C/O dry mouth, dry eyes, PND. CV: Negative for chest pain, angina, palpitations, dyspnea on exertion, peripheral edema.  Respiratory: Negative for dyspnea at rest, dyspnea on exertion, cough, sputum, wheezing.  GI: See history of present illness. GU:  Negative for dysuria, hematuria, urinary incontinence, urinary frequency, nocturnal urination.  MS: C/O joint pain, scheduled to see Dr. Romeo Apple. Negative for low back pain.  Derm: Negative for rash or itching.  Neuro: Negative for weakness, abnormal sensation,  seizure, frequent headaches, memory loss, confusion.  Psych: Negative for anxiety, depression, suicidal ideation, hallucinations.  Endo: Negative for unusual weight change.  Heme: Negative for bruising or bleeding. Allergy: Negative for rash or hives.    Physical Examination:  BP 120/67  Pulse 66  Temp(Src) 98.1 F (36.7 C) (Temporal)  Ht 5\' 8"  (1.727 m)  Wt 269 lb (122.018 kg)  BMI 40.90 kg/m2   General: Jaundiced appearing WF, well-developed in no acute distress.  Head: Normocephalic, atraumatic.   Eyes: Conjunctiva pink, sclera icterus. Mouth: Oropharyngeal mucosa moist and pink , no lesions erythema or exudate. Neck: Supple without thyromegaly, masses, or lymphadenopathy.  Lungs: Clear to auscultation bilaterally.  Heart: Regular rate and rhythm, no murmurs rubs or gallops.  Abdomen: Bowel sounds are normal, nontender, nondistended, no hepatosplenomegaly or masses, no abdominal bruits or hernia , no rebound or guarding. Exam limited due to body habitus.    Rectal: Not performed. Extremities: 1-2+ pedal edema bilaterally. No clubbing or deformities.  Neuro: Alert and oriented x 4 , grossly normal neurologically.  Skin: Warm and dry, no rash or jaundice.   Psych: Alert and cooperative, normal mood and affect.  Labs: Labs from 07/30/2011, sodium 138, potassium 3.8, glucose 137, BUN 4, creatinine 0.64, total bilirubin 3.6, alkaline phosphatase 111, AST 171, ALT 59, albumin 3, white blood cell count 6800, hemoglobin 14.2, hematocrit 41.2, MCV 104.3, platelets 120,000, TSH 1.60 to, stool lactoferrin positive, C. Difficile toxin a/b Titer negative.  Imaging Studies: Abd u/s from [facility] done in 01/2009 6mm stone in neck of gb. Liver appeared normal.

## 2011-08-10 NOTE — Patient Instructions (Signed)
I will review your previous abdominal ultrasound and labs. You will likely need more labs and reimaging of your liver. We will let you know. Once the labs/u/s are reviewed, we will decide if you need an upper endoscopy at same time as you colonoscopy.   Please cut back on your alcohol.

## 2011-08-10 NOTE — Assessment & Plan Note (Signed)
Abnormal LFTs, per patient they have been up in past related to etoh use but improved with cutting back on etoh. She admits to bottle of wine daily for years. She has jaundice on exam, macrocytosis, thrombocytopenia. Likely has advanced liver disease and splenomegaly. Retrieved last u/s done in 2010, liver normal at that time. Need to reevaluate liver at this time. She has had Hep B surf Ag negative, HCV Ab negative, Hep A IgM negative. Need to determine immune status to Hep A and Hep B. 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 Long discussion with patient today, she needs to eliminate etoh use. She has options of doing this at home with Librium or outpatient/inpatient facility. She is not interested in completely quitting etoh use at this time.

## 2011-08-11 NOTE — Progress Notes (Unsigned)
Per LSL-Patient needs to be scheduled for abd u/s and have labs done. See OV note from today. Thanks.

## 2011-08-11 NOTE — Progress Notes (Unsigned)
Pt aware, lab order faxed to lab.  Crystal please schedule U/S

## 2011-08-11 NOTE — Progress Notes (Signed)
[place] scheduled for 05/16 @ 9:00am - pt aware to be NPO after midnight

## 2011-08-12 ENCOUNTER — Ambulatory Visit (INDEPENDENT_AMBULATORY_CARE_PROVIDER_SITE_OTHER): Payer: BC Managed Care – PPO | Admitting: Orthopedic Surgery

## 2011-08-12 ENCOUNTER — Encounter: Payer: Self-pay | Admitting: Orthopedic Surgery

## 2011-08-12 VITALS — BP 98/60 | Ht 68.0 in | Wt 269.0 lb

## 2011-08-12 DIAGNOSIS — S83419A Sprain of medial collateral ligament of unspecified knee, initial encounter: Secondary | ICD-10-CM

## 2011-08-12 NOTE — Patient Instructions (Signed)
Wear sleeve x 1 month

## 2011-08-12 NOTE — Progress Notes (Signed)
  Subjective:    Deanna Thompson is a 55 y.o. female who presents as a referral from urgent care with acute left knee pain which began 3 weeks ago. The patient was at home she was ill she is trying to get to the bathroom and fell while walking in the dark. She describes sharp dull medial knee pain 7/10 intermittent seems to come and go associated with some catching especially when she moves her knee a certain way mainly with extension. Her knee pain is better with rest. She doesn't feel it's really gotten that much better over the 3 weeks.  Review of systems she reports a history of snoring heartburn nausea diarrhea joint pain stiffness rash excessive thirst remaining systems were normal. History has been reviewed as noted. The following portions of the patient's history were reviewed and updated as appropriate: allergies, current medications, past family history, past medical history, past social history, past surgical history and problem list.   Review of Systems Pertinent items are noted in HPI.   Objective:    BP 98/60  Ht 5\' 8"  (1.727 m)  Wt 269 lb (122.018 kg)  BMI 40.90 kg/m2 Vital signs are stable as recorded  General appearance is normal  The patient is alert and oriented x3  The patient's mood and affect are normal  Gait assessment: No assisted devices required The cardiovascular exam reveals normal pulses and temperature without edema swelling.  The lymphatic system is negative for palpable lymph nodes  The sensory exam is normal.  There are no pathologic reflexes.  Balance is normal.   Exam of the left knee Inspection no joint effusion. 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

## 2011-08-12 NOTE — Progress Notes (Signed)
Quick Note:  Outside labs addressed. New labs pending. ______

## 2011-08-13 ENCOUNTER — Telehealth: Payer: Self-pay | Admitting: Gastroenterology

## 2011-08-13 ENCOUNTER — Ambulatory Visit (HOSPITAL_COMMUNITY)
Admission: RE | Admit: 2011-08-13 | Discharge: 2011-08-13 | Disposition: A | Discharge status: Home / Self Care | Payer: BC Managed Care – PPO | Source: Ambulatory Visit | Attending: Gastroenterology | Admitting: Gastroenterology

## 2011-08-13 DIAGNOSIS — R7989 Other specified abnormal findings of blood chemistry: Secondary | ICD-10-CM

## 2011-08-13 DIAGNOSIS — R600 Localized edema: Secondary | ICD-10-CM

## 2011-08-13 DIAGNOSIS — K7010 Alcoholic hepatitis without ascites: Secondary | ICD-10-CM

## 2011-08-13 DIAGNOSIS — R17 Unspecified jaundice: Secondary | ICD-10-CM | POA: Insufficient documentation

## 2011-08-13 DIAGNOSIS — R188 Other ascites: Secondary | ICD-10-CM | POA: Insufficient documentation

## 2011-08-13 DIAGNOSIS — D7589 Other specified diseases of blood and blood-forming organs: Secondary | ICD-10-CM

## 2011-08-13 DIAGNOSIS — R197 Diarrhea, unspecified: Secondary | ICD-10-CM

## 2011-08-13 DIAGNOSIS — D696 Thrombocytopenia, unspecified: Secondary | ICD-10-CM | POA: Insufficient documentation

## 2011-08-13 DIAGNOSIS — R945 Abnormal results of liver function studies: Secondary | ICD-10-CM | POA: Insufficient documentation

## 2011-08-13 NOTE — Telephone Encounter (Signed)
Pt had ultrasound earlier this week and was checking to see if results were back. Please call on her home number [phone]

## 2011-08-13 NOTE — Progress Notes (Signed)
Quick Note:  Likely has small gallstones.  Liver with cirrhosis. Difficulty in demonstrating blood flow in main portal vein. ?portal vein thrombosis or cavernous formation.   To discuss with Dr. Jena Gauss.  Still waiting on lab results. ______

## 2011-08-13 NOTE — Telephone Encounter (Signed)
I saw patient at hospital this morning before she had ultrasound. Reminded her to get labs done as well. We will call when results are available, which is what I told her at 10am this morning.

## 2011-08-13 NOTE — Telephone Encounter (Signed)
Routing to Leslie Lewis, PA.  

## 2011-08-13 NOTE — Progress Notes (Signed)
Quick Note:  Informed pt. She said she had not had all labs, and she will get them done tomorrow morning. ______

## 2011-08-14 LAB — TISSUE TRANSGLUTAMINASE, IGA: Tissue Transglutaminase Ab, IgA: 11.3 U/mL (ref <20)

## 2011-08-14 LAB — IRON AND TIBC
Iron: 72 ug/dL (ref 42–145)
UIBC: 51 ug/dL — ABNORMAL LOW (ref 125–400)

## 2011-08-14 LAB — CREATININE, SERUM: Creat: 0.76 mg/dL (ref 0.50–1.10)

## 2011-08-14 LAB — HEPATITIS B SURFACE ANTIBODY,QUALITATIVE: Hep B S Ab: NEGATIVE

## 2011-08-14 LAB — HEPATIC FUNCTION PANEL
Alkaline Phosphatase: 100 U/L (ref 39–117)
Bilirubin, Direct: 2.4 mg/dL — ABNORMAL HIGH (ref 0.0–0.3)
Indirect Bilirubin: 3.5 mg/dL — ABNORMAL HIGH (ref 0.0–0.9)
Total Protein: 6.7 g/dL (ref 6.0–8.3)

## 2011-08-14 LAB — AFP TUMOR MARKER: AFP-Tumor Marker: 7.8 ng/mL (ref 0.0–8.0)

## 2011-08-14 LAB — FOLATE: Folate: 3.4 ng/mL

## 2011-08-14 LAB — VITAMIN B12: Vitamin B-12: 718 pg/mL (ref 211–911)

## 2011-08-14 LAB — PROTIME-INR
INR: 2.74 — ABNORMAL HIGH (ref <1.50)
Prothrombin Time: 29.9 seconds — ABNORMAL HIGH (ref 11.6–15.2)

## 2011-08-17 ENCOUNTER — Other Ambulatory Visit: Payer: Self-pay | Admitting: Gastroenterology

## 2011-08-17 DIAGNOSIS — R9389 Abnormal findings on diagnostic imaging of other specified body structures: Secondary | ICD-10-CM

## 2011-08-17 NOTE — Progress Notes (Signed)
Quick Note:  Forwarding to [employer]. ______

## 2011-08-18 ENCOUNTER — Telehealth: Payer: Self-pay | Admitting: Gastroenterology

## 2011-08-18 NOTE — Telephone Encounter (Signed)
PT NEEDS AN OPV 5/22 W/ LL OR [facility].

## 2011-08-18 NOTE — Telephone Encounter (Signed)
Pt will see Dr. Darrick Penna at 10:00 AM on 08/19/2011. Soledad Gerlach putting on the schedule.

## 2011-08-18 NOTE — Telephone Encounter (Signed)
Patient is obtaining a lot of fluid in her abdomin and having a full feeling but [place], shes scheduled for a CT scan on Thursday the 23rd but shes asking what she can do until then please advise??

## 2011-08-18 NOTE — Telephone Encounter (Signed)
REVIEWED.  PLEASE FOLLOW UP WITH PT.

## 2011-08-18 NOTE — Telephone Encounter (Signed)
Called and spoke with pt. She said she has a lot of abdominal swelling. She weighed 269 lbs at last OV here on 08/13/2011. She weighs 279 lbs, on her scale today, but she is not sure how accurate her scales are. She said her abdomen is tight and uncomfortable. Her stools are doing better. When she takes the Lomotil three times daily, her diarrhea is much better. She has some shortness of breath sometimes. ( I told her if SOB worsens to go to the ED). She is aware that Dr. Darrick Penna is at the hospital and I will call when I hear from her.

## 2011-08-18 NOTE — Telephone Encounter (Signed)
Called pt. She said the shortness of breath is no worse. It is usually worse late in the evening. She is most concerned about her weight and the tightness in her abdomen. Said she will be glad to come by tomorrow if Dr. Darrick Penna wants her to . She just wants some recommendation.

## 2011-08-19 ENCOUNTER — Encounter: Payer: Self-pay | Admitting: Gastroenterology

## 2011-08-19 ENCOUNTER — Telehealth: Payer: Self-pay

## 2011-08-19 ENCOUNTER — Ambulatory Visit (INDEPENDENT_AMBULATORY_CARE_PROVIDER_SITE_OTHER): Payer: BC Managed Care – PPO | Admitting: Gastroenterology

## 2011-08-19 VITALS — BP 114/68 | HR 73 | Temp 97.3°F | Ht 68.0 in | Wt 273.6 lb

## 2011-08-19 DIAGNOSIS — K7460 Unspecified cirrhosis of liver: Secondary | ICD-10-CM

## 2011-08-19 MED ORDER — ONDANSETRON HCL 4 MG PO TABS: ORAL_TABLET | ORAL | Status: DC

## 2011-08-19 MED ORDER — SPIRONOLACTONE 50 MG PO TABS: ORAL_TABLET | ORAL | Status: DC

## 2011-08-19 MED ORDER — FUROSEMIDE 20 MG PO TABS: ORAL_TABLET | ORAL | Status: DC

## 2011-08-19 NOTE — Progress Notes (Addendum)
Subjective:    Patient ID: Deanna Thompson, female    DOB: [birth year], 56 y.o.   MRN: [id number]  PCP: Chales Salmon, PA-C  HPI DIARRHEA, DRY HEAVES, BELLY PAIN, NAUSEA. BMS: INITALLY-LESS THAN 5-10 TMES A DAY, NO MORE THAN 3 ON A BAD DAY. NOT BEEN ON HUMIRA FOR 5 MOS. STOPPED IT DUE TO 6 EAR INFECTION IN EIGHT WEEKS. JOINT PAIN 6/10. NO MORE EAR INFECTION. NO RECTAL BLEEDING, MELENA, VOMITING, OR PROBLEMS SWALLOWING. NO TATTOOS OR BLOOD TRANSFUSIONS.  HX: ETOH-LAST ETOH A LITTLE OVER A WEEK. USED TO DRINK-?COUPLE BOTTLES OF WINE/DAY FOR 13 YEARS.WEIGHT: WAS 261 LBS BEFORE STARTED GAINING FLUID.   Past Medical History  Diagnosis Date  . Brain aneurysm   . IBS (irritable bowel syndrome)   . GERD (gastroesophageal reflux disease)   . Panic attack   . Anxiety   . HTN (hypertension)   . Psoriatic arthritis   . ETOH abuse   . Sjogren's disease     Past Surgical History  Procedure Date  . Cerebral aneurysm repair    Allergies  Allergen Reactions  . Phenytoin Rash  . Cephalexin Other (See Comments)    Severe yeast infection    Current Outpatient Prescriptions  Medication Sig Dispense Refill  .        . diphenoxylate-atropine (LOMOTIL) 2.5-0.025 MG per tablet Take 1 tablet by mouth 3 (three) times daily as needed: 10/2/7P      . escitalopram (LEXAPRO) 10 MG tablet Take 10 mg by mouth daily.        . metoprolol (TOPROL-XL) 100 MG 24 hr tablet Take 100 mg by mouth daily.        . mometasone (NASONEX) 50 MCG/ACT nasal spray Place 2 sprays into the nose daily.        . Olopatadine HCl (PATANASE) 0.6 % SOLN Place 1 each into the nose 2 (two) times daily.        . Probiotic Product (PROBIOTIC PO) CULTURELLE Take by mouth.      . ranitidine (ZANTAC) 300 MG tablet Take 300 mg by mouth 2 (two) times daily PRN      . tetrahydrozoline-zinc (VISINE-AC) 0.05-0.25 % ophthalmic solution Place 2 drops into both eyes daily as needed. For redness       . verapamil (COVERA HS) 240 MG (CO) 24 hr tablet  Take 240 mg by mouth daily.        .         Family History  Problem Relation Age of Onset  . Cirrhosis Mother     nash  . Colon cancer Neg Hx   . Inflammatory bowel disease Neg Hx   . Asthma    . Heart disease      History   Social History  . Marital Status: Married            .     Occupational History  . PT IS MUSICIAN AND SINGER    Social History Main Topics  . Smoking status: Former Smoker -- 2.0 packs/day for 12 years    Types: Cigarettes    Quit date: 03/31/1995  . Smokeless tobacco: Not on file   Comment: none since after aneurysm repair  . Alcohol Use: Yes     Bottle of wine a day for years, but quit in 07/2011  . Drug Use: No  . Sexually Active: Not on file   Social History Narrative   Quit drinking alcohol 3 weeks ago. No drugs, no IVDU.  Review of Systems     Objective:   Physical Exam  Vitals reviewed. Constitutional: She is oriented to person, place, and time. No distress.  HENT:  Head: Normocephalic and atraumatic.  Mouth/Throat: Oropharynx is clear and moist. No oropharyngeal exudate.  Eyes: Pupils are equal, round, and reactive to light. Scleral icterus is present.  Neck: Normal range of motion. Neck supple.  Cardiovascular: Normal rate, regular rhythm and normal heart sounds.   Pulmonary/Chest: Effort normal and breath sounds normal. No respiratory distress. She exhibits no tenderness.       SPIDER ANGIOMATA ON CHEST  Abdominal: Soft. Bowel sounds are normal. She exhibits distension (MILD). There is no tenderness. There is no rebound and no guarding.       OBESE  Musculoskeletal: Normal range of motion. She exhibits edema.  Lymphadenopathy:    She has no cervical adenopathy.  Neurological: She is alert and oriented to person, place, and time.       NO FOCAL DEFICITS   Psychiatric:       SLIGHTLY ANXIOUS MOOD, NL AFFECT           Assessment & Plan:

## 2011-08-19 NOTE — Telephone Encounter (Signed)
SPOKE WITH PT RE: QUESTIONS AT THE FRONT DESK.

## 2011-08-19 NOTE — Telephone Encounter (Signed)
As pt was leaving ov today, she had a couple more questions for Dr. Darrick Penna, who was with another pt. I took the questions and told her we will call her back.  1. Said she was diagnosed about a week ago by Dr. Romeo Apple with bone infarction, and she wanted to know if that would be significant for Dr. Darrick Penna, or any comments.  2. York Spaniel she just wants to sleep all of the time, sometimes and wanted to know if she should be over concerned about that.  3. Pt said she stopped the alcohol a week ago Mon. She said she is sure it will be more difficult in social settings. She would like to know if there is any literature to help/encourage for this.

## 2011-08-19 NOTE — Patient Instructions (Signed)
YOU NEED TO EAT THREE MEALS A DAY.  FOLLOW  LOW FAT DIET. SEE INFO BELOW.  IF YOU DO NOT FEEL LIKE EATING, SUBSTITUTE/DRINK BOOST, ENSURE, OR CARNATION INSTANT BREAKFAST WITH SOY MILK FOR YOUR MEAL.   TAKE ALDACTONE AND LASIX EVERY MORNING.   WEIGH YOURSELF EVERY MORNING. CALL ME IN ONE WEEK WITH YOUR WEIGHTS.    USE ZOFRAN 30 MINUTES PRIOR TO MEALS TO PREVENT NAUSEA AND DRY HEAVES.  TRY ACIPHEX DAILY TO CONTROL REFLUX.  USE LOMOTIL AS NEEDED FOR LOOSE STOOLS.  YOU MAY ONLY USE TYLENOL 325 MG 2 EVERY 8 HOURS AS NEEDED FOR PAIN.   CONTINUE WITH YOUR EFFORTS TO AVOID ALCOHOL.   YOU NEED TO HAVE LABS DRAWN TO COMPLETE THE EVALUATION FOR YOUR LIVER DISEASE.   YOU NEED TO SEE MR. SUAREZ TO START THE HEPATITIS A AND B VACCINE.  FOLLOW UP WITH ME IN 6 WEEKS.   Low-Fat Diet BREADS, CEREALS, PASTA, RICE, DRIED PEAS, AND BEANS These products are high in carbohydrates and most are low in fat. Therefore, they can be increased in the diet as substitutes for fatty foods. They too, however, contain calories and should not be eaten in excess. Cereals can be eaten for snacks as well as for breakfast.  Include foods that contain fiber (fruits, vegetables, whole grains, and legumes). Research shows that fiber may lower blood cholesterol levels, especially the water-soluble fiber found in fruits, vegetables, oat products, and legumes. FRUITS AND VEGETABLES It is good to eat fruits and vegetables. Besides being sources of fiber, both are rich in vitamins and some minerals. They help you get the daily allowances of these nutrients. Fruits and vegetables can be used for snacks and desserts. MEATS Limit lean meat, chicken, [place], and fish to no more than 6 ounces per day. Beef, Pork, and Lamb Use lean cuts of beef, pork, and lamb. Lean cuts include:  Extra-lean ground beef.  Arm roast.  Sirloin tip.  Center-cut ham.  Round steak.  Loin chops.  Rump roast.  Tenderloin.  Trim all fat off  the outside of meats before cooking. It is not necessary to severely decrease the intake of red meat, but lean choices should be made. Lean meat is rich in protein and contains a highly absorbable form of iron. Premenopausal women, in particular, should avoid reducing lean red meat because this could increase the risk for low red blood cells (iron-deficiency anemia).  Chicken and [place] These are good sources of protein. The fat of poultry can be reduced by removing the skin and underlying fat layers before cooking. Chicken and [place] can be substituted for lean red meat in the diet. Poultry should not be fried or covered with high-fat sauces. Fish and Shellfish Fish is a good source of protein. Shellfish contain cholesterol, but they usually are low in saturated fatty acids. The preparation of fish is important. Like chicken and [place], they should not be fried or covered with high-fat sauces. EGGS Egg whites contain no fat or cholesterol. They can be eaten often. Try 1 to 2 egg whites instead of whole eggs in recipes or use egg substitutes that do not contain yolk.  MILK AND DAIRY PRODUCTS Use skim or 1% milk instead of 2% or whole milk. Decrease whole milk, natural, and processed cheeses. Use nonfat or low-fat (2%) cottage cheese or low-fat cheeses made from vegetable oils. Choose nonfat or low-fat (1 to 2%) yogurt. Experiment with evaporated skim milk in recipes that call for heavy cream. Substitute low-fat  yogurt or low-fat cottage cheese for sour cream in dips and salad dressings. Have at least 2 servings of low-fat dairy products, such as 2 glasses of skim (or 1%) milk each day to help get your daily calcium intake.  FATS AND OILS Butterfat, lard, and beef fats are high in saturated fat and cholesterol. These should be avoided.Vegetable fats do not contain cholesterol. AVOID coconut oil, palm oil, and palm kernel oil, WHICH are very high in saturated fats. These should be limited. These fats are  often used in bakery goods, processed foods, popcorn, oils, and nondairy creamers. Vegetable shortenings and some peanut butters contain hydrogenated oils, which are also saturated fats. Read the labels on these foods and check for saturated vegetable oils.  Desirable liquid vegetable oils are corn oil, cottonseed oil, olive oil, canola oil, safflower oil, soybean oil, and sunflower oil. Peanut oil is not as good, but small amounts are acceptable. Buy a heart-healthy tub margarine that has no partially hydrogenated oils in the ingredients. AVOID Mayonnaise and salad dressings often are made from unsaturated fats.  OTHER EATING TIPS Snacks  Most sweets should be limited as snacks. They tend to be rich in calories and fats, and their caloric content outweighs their nutritional value. Some good choices in snacks are graham crackers, melba toast, soda crackers, bagels (no egg), English muffins, fruits, and vegetables. These snacks are preferable to snack crackers, [place] fries, and chips. Popcorn should be air-popped or cooked in small amounts of liquid vegetable oil.  Desserts Eat fruit, low-fat yogurt, and fruit ices instead of pastries, cake, and cookies. Sherbet, angel food cake, gelatin dessert, frozen low-fat yogurt, or other frozen products that do not contain saturated fat (pure fruit juice bars, frozen ice pops) are also acceptable.   COOKING METHODS Choose those methods that use little or no fat. They include: Poaching.  Braising.  Steaming.  Grilling.  Baking.  Stir-frying.  Broiling.  Microwaving.  Foods can be cooked in a nonstick pan without added fat, or use a nonfat cooking spray in regular cookware. Limit fried foods and avoid frying in saturated fat. Add moisture to lean meats by using water, broth, cooking wines, and other nonfat or low-fat sauces along with the cooking methods mentioned above. Soups and stews should be chilled after cooking. The fat that forms on top after a few  hours in the refrigerator should be skimmed off. When preparing meals, avoid using excess salt. Salt can contribute to raising blood pressure in some people.  EATING AWAY FROM HOME Order entres, potatoes, and vegetables without sauces or butter. When meat exceeds the size of a deck of cards (3 to 4 ounces), the rest can be taken home for another meal. Choose vegetable or fruit salads and ask for low-calorie salad dressings to be served on the side. Use dressings sparingly. Limit high-fat toppings, such as bacon, crumbled eggs, cheese, sunflower seeds, and olives. Ask for heart-healthy tub margarine instead of butter.

## 2011-08-20 ENCOUNTER — Ambulatory Visit (HOSPITAL_COMMUNITY)
Admission: RE | Admit: 2011-08-20 | Discharge: 2011-08-20 | Disposition: A | Discharge status: Home / Self Care | Payer: BC Managed Care – PPO | Source: Ambulatory Visit | Attending: Gastroenterology | Admitting: Gastroenterology

## 2011-08-20 DIAGNOSIS — R933 Abnormal findings on diagnostic imaging of other parts of digestive tract: Secondary | ICD-10-CM | POA: Insufficient documentation

## 2011-08-20 DIAGNOSIS — K8020 Calculus of gallbladder without cholecystitis without obstruction: Secondary | ICD-10-CM | POA: Insufficient documentation

## 2011-08-20 DIAGNOSIS — K766 Portal hypertension: Secondary | ICD-10-CM | POA: Insufficient documentation

## 2011-08-20 DIAGNOSIS — R188 Other ascites: Secondary | ICD-10-CM | POA: Insufficient documentation

## 2011-08-20 DIAGNOSIS — R9389 Abnormal findings on diagnostic imaging of other specified body structures: Secondary | ICD-10-CM

## 2011-08-20 DIAGNOSIS — K7460 Unspecified cirrhosis of liver: Secondary | ICD-10-CM | POA: Insufficient documentation

## 2011-08-20 LAB — ANTI-NUCLEAR AB-TITER (ANA TITER): ANA Titer 1: NEGATIVE

## 2011-08-20 LAB — ANA: Anti Nuclear Antibody(ANA): POSITIVE — AB

## 2011-08-20 LAB — IGG, IGA, IGM: IgA: 1250 mg/dL — ABNORMAL HIGH (ref 69–380)

## 2011-08-20 LAB — MITOCHONDRIAL ANTIBODIES: Mitochondrial M2 Ab, IgG: 0.75 (ref <0.91)

## 2011-08-20 LAB — CERULOPLASMIN: Ceruloplasmin: 20 mg/dL (ref 20–60)

## 2011-08-20 MED ORDER — IOHEXOL 300 MG/ML  SOLN
100.0000 mL | Freq: Once | INTRAMUSCULAR | Status: AC | PRN
  Administered 2011-08-20: 100 mL via INTRAVENOUS

## 2011-08-21 NOTE — Progress Notes (Signed)
Quick Note:  Gallstones. Cirrhosis. NO portal vein thrombosis. ?right sided colitis.   She needs EGD/TCS in OR (h/o etoh abuse) with [facility]. Dx: diarrhea, abnormal colon on CT, screen for esophageal varices.  PLEASE APPROVE WITH [facility] BEFORE SCHEDULING BECAUSE THIS PATIENT HAS SO MUCH GOING ON RIGHT NOW. SAW BY [facility] THIS WEEK. ______

## 2011-08-21 NOTE — Progress Notes (Signed)
Quick Note:  Folate level low. Needs to take folic acid daily. Can buy OTC. Ferritin and iron sat up a little.  LFTs were up secondary to etoh use. This part was discussed by SLF at time of OV. I discussed with SLF regarding use of prednisone for DF of 88. No prednisone started by SLF. MELD ([facility]) 22.   Per SLF note, patient aware of need for Hep A and B vaccinations.   I would recommend repeat labs fasting in two weeks. LFTs, ferritin, iron and TIBC, PT/INR. Patient also with labs done by [facility]F that are significant. Please discuss with SLF before ordering my labs in case she has other suggestions.   ______

## 2011-08-25 ENCOUNTER — Inpatient Hospital Stay (HOSPITAL_COMMUNITY)
Admission: EM | Admit: 2011-08-25 | Discharge: 2011-09-05 | DRG: 557 | Disposition: A | Discharge status: Home / Self Care | Payer: BC Managed Care – PPO | Source: Ambulatory Visit | Attending: Internal Medicine | Admitting: Internal Medicine

## 2011-08-25 ENCOUNTER — Encounter (HOSPITAL_COMMUNITY): Payer: Self-pay | Admitting: Emergency Medicine

## 2011-08-25 ENCOUNTER — Emergency Department (HOSPITAL_COMMUNITY): Payer: BC Managed Care – PPO

## 2011-08-25 ENCOUNTER — Telehealth: Payer: Self-pay | Admitting: Gastroenterology

## 2011-08-25 DIAGNOSIS — D684 Acquired coagulation factor deficiency: Secondary | ICD-10-CM | POA: Diagnosis present

## 2011-08-25 DIAGNOSIS — E876 Hypokalemia: Secondary | ICD-10-CM | POA: Diagnosis present

## 2011-08-25 DIAGNOSIS — E538 Deficiency of other specified B group vitamins: Secondary | ICD-10-CM | POA: Diagnosis present

## 2011-08-25 DIAGNOSIS — F1020 Alcohol dependence, uncomplicated: Secondary | ICD-10-CM | POA: Diagnosis present

## 2011-08-25 DIAGNOSIS — I959 Hypotension, unspecified: Secondary | ICD-10-CM | POA: Diagnosis present

## 2011-08-25 DIAGNOSIS — R142 Eructation: Secondary | ICD-10-CM | POA: Diagnosis not present

## 2011-08-25 DIAGNOSIS — R0600 Dyspnea, unspecified: Secondary | ICD-10-CM

## 2011-08-25 DIAGNOSIS — N179 Acute kidney failure, unspecified: Secondary | ICD-10-CM | POA: Diagnosis present

## 2011-08-25 DIAGNOSIS — Y92009 Unspecified place in unspecified non-institutional (private) residence as the place of occurrence of the external cause: Secondary | ICD-10-CM

## 2011-08-25 DIAGNOSIS — F410 Panic disorder [episodic paroxysmal anxiety] without agoraphobia: Secondary | ICD-10-CM | POA: Diagnosis present

## 2011-08-25 DIAGNOSIS — R600 Localized edema: Secondary | ICD-10-CM | POA: Diagnosis present

## 2011-08-25 DIAGNOSIS — S83419A Sprain of medial collateral ligament of unspecified knee, initial encounter: Secondary | ICD-10-CM

## 2011-08-25 DIAGNOSIS — D689 Coagulation defect, unspecified: Secondary | ICD-10-CM | POA: Diagnosis present

## 2011-08-25 DIAGNOSIS — M79609 Pain in unspecified limb: Secondary | ICD-10-CM

## 2011-08-25 DIAGNOSIS — S92309A Fracture of unspecified metatarsal bone(s), unspecified foot, initial encounter for closed fracture: Secondary | ICD-10-CM

## 2011-08-25 DIAGNOSIS — D7589 Other specified diseases of blood and blood-forming organs: Secondary | ICD-10-CM

## 2011-08-25 DIAGNOSIS — I498 Other specified cardiac arrhythmias: Secondary | ICD-10-CM | POA: Diagnosis present

## 2011-08-25 DIAGNOSIS — R945 Abnormal results of liver function studies: Secondary | ICD-10-CM

## 2011-08-25 DIAGNOSIS — M3500 Sicca syndrome, unspecified: Secondary | ICD-10-CM | POA: Diagnosis present

## 2011-08-25 DIAGNOSIS — K219 Gastro-esophageal reflux disease without esophagitis: Secondary | ICD-10-CM | POA: Diagnosis present

## 2011-08-25 DIAGNOSIS — R141 Gas pain: Secondary | ICD-10-CM | POA: Diagnosis not present

## 2011-08-25 DIAGNOSIS — T50905A Adverse effect of unspecified drugs, medicaments and biological substances, initial encounter: Secondary | ICD-10-CM | POA: Diagnosis present

## 2011-08-25 DIAGNOSIS — B961 Klebsiella pneumoniae [K. pneumoniae] as the cause of diseases classified elsewhere: Secondary | ICD-10-CM | POA: Diagnosis not present

## 2011-08-25 DIAGNOSIS — N390 Urinary tract infection, site not specified: Secondary | ICD-10-CM | POA: Diagnosis not present

## 2011-08-25 DIAGNOSIS — K7030 Alcoholic cirrhosis of liver without ascites: Secondary | ICD-10-CM | POA: Diagnosis present

## 2011-08-25 DIAGNOSIS — K7010 Alcoholic hepatitis without ascites: Secondary | ICD-10-CM

## 2011-08-25 DIAGNOSIS — D72829 Elevated white blood cell count, unspecified: Secondary | ICD-10-CM | POA: Diagnosis present

## 2011-08-25 DIAGNOSIS — L4050 Arthropathic psoriasis, unspecified: Secondary | ICD-10-CM | POA: Diagnosis present

## 2011-08-25 DIAGNOSIS — S53106A Unspecified dislocation of unspecified ulnohumeral joint, initial encounter: Secondary | ICD-10-CM

## 2011-08-25 DIAGNOSIS — R7989 Other specified abnormal findings of blood chemistry: Secondary | ICD-10-CM

## 2011-08-25 DIAGNOSIS — F411 Generalized anxiety disorder: Secondary | ICD-10-CM | POA: Diagnosis present

## 2011-08-25 DIAGNOSIS — R404 Transient alteration of awareness: Secondary | ICD-10-CM | POA: Diagnosis not present

## 2011-08-25 DIAGNOSIS — R197 Diarrhea, unspecified: Secondary | ICD-10-CM

## 2011-08-25 DIAGNOSIS — R188 Other ascites: Secondary | ICD-10-CM | POA: Diagnosis present

## 2011-08-25 DIAGNOSIS — R410 Disorientation, unspecified: Secondary | ICD-10-CM | POA: Diagnosis present

## 2011-08-25 DIAGNOSIS — K7460 Unspecified cirrhosis of liver: Secondary | ICD-10-CM

## 2011-08-25 DIAGNOSIS — Z87891 Personal history of nicotine dependence: Secondary | ICD-10-CM

## 2011-08-25 DIAGNOSIS — T502X5A Adverse effect of carbonic-anhydrase inhibitors, benzothiadiazides and other diuretics, initial encounter: Secondary | ICD-10-CM | POA: Diagnosis present

## 2011-08-25 DIAGNOSIS — R17 Unspecified jaundice: Secondary | ICD-10-CM

## 2011-08-25 DIAGNOSIS — K769 Liver disease, unspecified: Principal | ICD-10-CM | POA: Diagnosis present

## 2011-08-25 DIAGNOSIS — D696 Thrombocytopenia, unspecified: Secondary | ICD-10-CM

## 2011-08-25 DIAGNOSIS — R001 Bradycardia, unspecified: Secondary | ICD-10-CM | POA: Diagnosis present

## 2011-08-25 HISTORY — DX: Unspecified cirrhosis of liver: K74.60

## 2011-08-25 LAB — POCT I-STAT, CHEM 8
BUN: 9 mg/dL (ref 6–23)
Chloride: 101 mEq/L (ref 96–112)
Creatinine, Ser: 1.3 mg/dL — ABNORMAL HIGH (ref 0.50–1.10)
Glucose, Bld: 137 mg/dL — ABNORMAL HIGH (ref 70–99)
Hemoglobin: 13.9 g/dL (ref 12.0–15.0)
Potassium: 3.8 mEq/L (ref 3.5–5.1)
Sodium: 135 mEq/L (ref 135–145)

## 2011-08-25 LAB — POCT I-STAT TROPONIN I

## 2011-08-25 LAB — DIFFERENTIAL
Basophils Absolute: 0.1 10*3/uL (ref 0.0–0.1)
Basophils Relative: 1 % (ref 0–1)
Eosinophils Absolute: 0.5 10*3/uL (ref 0.0–0.7)
Eosinophils Relative: 4 % (ref 0–5)
Lymphs Abs: 2.8 10*3/uL (ref 0.7–4.0)
Neutrophils Relative %: 63 % (ref 43–77)

## 2011-08-25 LAB — CBC
MCH: 37.3 pg — ABNORMAL HIGH (ref 26.0–34.0)
MCV: 105.9 fL — ABNORMAL HIGH (ref 78.0–100.0)
Platelets: 176 10*3/uL (ref 150–400)
RBC: 3.38 MIL/uL — ABNORMAL LOW (ref 3.87–5.11)
RDW: 15.6 % — ABNORMAL HIGH (ref 11.5–15.5)

## 2011-08-25 LAB — COMPREHENSIVE METABOLIC PANEL
AST: 141 U/L — ABNORMAL HIGH (ref 0–37)
Albumin: 2.4 g/dL — ABNORMAL LOW (ref 3.5–5.2)
Alkaline Phosphatase: 85 U/L (ref 39–117)
BUN: 10 mg/dL (ref 6–23)
CO2: 18 mEq/L — ABNORMAL LOW (ref 19–32)
Chloride: 99 mEq/L (ref 96–112)
Creatinine, Ser: 1.21 mg/dL — ABNORMAL HIGH (ref 0.50–1.10)
GFR calc non Af Amer: 50 mL/min — ABNORMAL LOW (ref >90)
Potassium: 3.9 mEq/L (ref 3.5–5.1)
Total Bilirubin: 6 mg/dL — ABNORMAL HIGH (ref 0.3–1.2)

## 2011-08-25 LAB — TROPONIN I: Troponin I: <0.3 ng/mL (ref <0.30)

## 2011-08-25 LAB — PRO B NATRIURETIC PEPTIDE: Pro B Natriuretic peptide (BNP): 675.9 pg/mL — ABNORMAL HIGH (ref 0–125)

## 2011-08-25 MED ORDER — GLUCAGON HCL (RDNA) 1 MG IJ SOLR
1.0000 mg | Freq: Once | INTRAMUSCULAR | Status: AC
  Administered 2011-08-25: 1 mg via INTRAVENOUS
  Filled 2011-08-25: qty 1

## 2011-08-25 MED ORDER — SODIUM CHLORIDE 0.9 % IV SOLN
1.0000 g | Freq: Once | INTRAVENOUS | Status: AC
  Administered 2011-08-25: 1 g via INTRAVENOUS
  Filled 2011-08-25: qty 10

## 2011-08-25 MED ORDER — ASPIRIN 325 MG PO TABS
325.0000 mg | ORAL_TABLET | Freq: Once | ORAL | Status: AC
  Administered 2011-08-25: 325 mg via ORAL
  Filled 2011-08-25: qty 1

## 2011-08-25 NOTE — Progress Notes (Signed)
Quick Note:  Pt is aware of the results and to start the Folic acid. She is aware I will call her later about her needed labs after Dr. Darrick Penna advises! ______

## 2011-08-25 NOTE — Progress Notes (Signed)
Quick Note:  Routing to Dr. Fields to sign off on before doing. ______ 

## 2011-08-25 NOTE — Progress Notes (Signed)
Quick Note:  Routing to Dr. Darrick Penna to sign off on before doing. ______

## 2011-08-25 NOTE — ED Provider Notes (Signed)
History     CSN: [id number]  Arrival date & time 08/25/11  4098   First MD Initiated Contact with Patient 08/25/11 1955      Chief Complaint  Patient presents with  . Shortness of Breath    (Consider location/radiation/quality/duration/timing/severity/associated sxs/prior treatment) HPI Comments: Dyspnea today.  Picked up by EMS with HR in 20s/30s.  Hypotensive in route but pt says she is asymptomatic after O2.  No chest pain today. Being treated for liver disease but otherwise doing well.  Patient is a 56 y.o. female presenting with shortness of breath. The history is provided by the patient.  Shortness of Breath  The current episode started today. The problem occurs rarely. The problem has been gradually worsening. The problem is moderate. Relieved by: o2. The symptoms are aggravated by nothing. Associated symptoms include shortness of breath. Pertinent negatives include no chest pain, no chest pressure, no fever, no cough and no wheezing.    Past Medical History  Diagnosis Date  . Brain aneurysm   . IBS (irritable bowel syndrome)   . GERD (gastroesophageal reflux disease)   . Panic attack   . Anxiety   . HTN (hypertension)   . Psoriatic arthritis   . ETOH abuse   . Sjogren's disease     Past Surgical History  Procedure Date  . Cerebral aneurysm repair     Family History  Problem Relation Age of Onset  . Cirrhosis Mother     nash  . Colon cancer Neg Hx   . Inflammatory bowel disease Neg Hx   . Asthma    . Heart disease      History  Substance Use Topics  . Smoking status: Former Games developer  . Smokeless tobacco: Not on file   Comment: none since after aneurysm repair  . Alcohol Use: Yes     bottle of wine a day for years    OB History    Grav Para Term Preterm Abortions TAB SAB Ect Mult Living                  Review of Systems  Constitutional: Negative for fever and activity change.  HENT: Negative for congestion.   Eyes: Negative for visual  disturbance.  Respiratory: Positive for shortness of breath. Negative for cough, chest tightness and wheezing.   Cardiovascular: Negative for chest pain and leg swelling.  Gastrointestinal: Negative for abdominal pain.  Genitourinary: Negative for dysuria.  Skin: Negative for rash.  Neurological: Negative for syncope.  Psychiatric/Behavioral: Negative for behavioral problems.    Allergies  Omeprazole; Phenytoin; and Cephalexin  Home Medications   Current Outpatient Rx  Name Route Sig Dispense Refill  . DIPHENOXYLATE-ATROPINE 2.5-0.025 MG PO TABS Oral Take 1 tablet by mouth every 6 (six) hours as needed. For diarrhea    . ESCITALOPRAM OXALATE 10 MG PO TABS Oral Take 10 mg by mouth daily.      . FUROSEMIDE 20 MG PO TABS Oral Take 20 mg by mouth daily.    Marland Kitchen METOPROLOL SUCCINATE ER 100 MG PO TB24 Oral Take 100 mg by mouth daily.     Marland Kitchen PATANASE NA Nasal Place 1 spray into the nose 2 (two) times daily.    Marland Kitchen ONDANSETRON HCL 4 MG PO TABS Oral Take 4 mg by mouth 4 (four) times daily -  before meals and at bedtime.    . SPIRONOLACTONE 50 MG PO TABS Oral Take 50 mg by mouth daily.    Marland Kitchen VISINE OP Ophthalmic Apply  2 drops to eye 2 (two) times daily.    Marland Kitchen VERAPAMIL HCL ER 240 MG PO TBCR Oral Take 240 mg by mouth daily.      BP 111/56  Pulse 52  Resp 17  SpO2 96%  Physical Exam  Constitutional: She is oriented to person, place, and time. She appears well-developed and well-nourished.  HENT:  Head: Normocephalic and atraumatic.  Eyes: EOM are normal. Pupils are equal, round, and reactive to light. Scleral icterus is present.  Neck: Normal range of motion. Neck supple.  Cardiovascular: Regular rhythm.  Exam reveals no gallop and no friction rub.   No murmur heard.      bradycardic  Pulmonary/Chest: Effort normal and breath sounds normal. No respiratory distress. She has no wheezes. She has no rales. She exhibits no tenderness.  Abdominal: Soft. She exhibits no distension and no mass. There  is no tenderness. There is no rebound and no guarding.  Musculoskeletal: Normal range of motion. She exhibits no edema.  Neurological: She is alert and oriented to person, place, and time. She has normal reflexes. No cranial nerve deficit.  Skin: Skin is warm and dry. No rash noted.  Psychiatric: She has a normal mood and affect. Her behavior is normal. Judgment and thought content normal.    ED Course  Procedures (including critical care time)   Date: 08/25/2011  Rate: 30  Rhythm: junctional brady  QRS Axis: normal  Intervals: normal as present ST/T Wave abnormalities: normal and indeterminate  Conduction Disutrbances:nonspecific intraventricular conduction delay  Narrative Interpretation:   Old EKG Reviewed: changes noted    Date: 08/25/2011  Rate: 53  Rhythm: junctional rhythm  QRS Axis: normal  Intervals: normal  ST/T Wave abnormalities: indeterminate  Conduction Disutrbances:nonspecific intraventricular conduction delay  Narrative Interpretation:   Old EKG Reviewed: changes noted     Labs Reviewed  COMPREHENSIVE METABOLIC PANEL - Abnormal; Notable for the following:    Sodium 131 (*)    CO2 18 (*)    Glucose, Bld 137 (*)    Creatinine, Ser 1.21 (*)    Albumin 2.4 (*)    AST 141 (*) HEMOLYSIS AT THIS LEVEL MAY AFFECT RESULT   ALT 64 (*)    Total Bilirubin 6.0 (*)    GFR calc non Af Amer 50 (*)    GFR calc Af Amer 58 (*)    All other components within normal limits  CBC - Abnormal; Notable for the following:    WBC 13.2 (*)    RBC 3.38 (*)    HCT 35.8 (*)    MCV 105.9 (*)    MCH 37.3 (*)    RDW 15.6 (*)    All other components within normal limits  DIFFERENTIAL - Abnormal; Notable for the following:    Neutro Abs 8.3 (*)    Monocytes Absolute 1.5 (*)    All other components within normal limits  PRO B NATRIURETIC PEPTIDE - Abnormal; Notable for the following:    Pro B Natriuretic peptide (BNP) 675.9 (*)    All other components within normal limits    PROTIME-INR - Abnormal; Notable for the following:    Prothrombin Time 26.3 (*)    INR 2.37 (*)    All other components within normal limits  POCT I-STAT, CHEM 8 - Abnormal; Notable for the following:    Creatinine, Ser 1.30 (*)    Glucose, Bld 137 (*)    Calcium, Ion 1.00 (*)    All other components within normal limits  TROPONIN I  MAGNESIUM  POCT I-STAT TROPONIN I  TSH  T4, FREE   Dg Chest Portable 1 View  08/25/2011  *RADIOLOGY REPORT*  Clinical Data: Shortness of breath  PORTABLE CHEST - 1 VIEW  Comparison: 01/22/2011  Findings: Apparent enlargement of the cardiac silhouette is favored to be artifactual secondary to decreased lung volumes and projection.  Unchanged mediastinal contours.  Bibasilar opacities favored to represent atelectasis.  No definite pleural effusion or pneumothorax.  Grossly unchanged bones.  IMPRESSION: Apparent enlargement cardiac silhouette is favored to be secondary to decreased lung volumes and projection. No definite acute cardiopulmonary disease.  Further evaluation with a PA and lateral chest radiograph may be obtained as clinically indicated.  Original Report Authenticated By: Waynard Reeds, M.D.     1. Symptomatic bradycardia   2. Dyspnea       MDM  Dyspnea today.  Picked up by EMS with HR in 20s/30s.  Hypotensive in route but pt says she is asymptomatic after O2.  No chest pain today. Being treated for liver disease but otherwise doing well.  On metoprolol but no extra doses.  Initially bradycardic in 20s with slight hypotension.  Gave fluids, glucagon, calcium with no improvement in rate.  Pt however appears well and is mentating well.   After 1 hour in ED labs unconcerning and rate improved to 50s.  Suspect poor metabolization of metoprolol given recent liver disease.  D/w cards Dr Terressa Koyanagi - recommended medicine admission.  Hospitalist to admit to stepdown.  HDS at time of admission.        Army [place]aco, MD 08/25/11 2229

## 2011-08-25 NOTE — Telephone Encounter (Signed)
Patient is asking for CT results please advise?  

## 2011-08-25 NOTE — Telephone Encounter (Signed)
Informed pt of results. She is aware I will call with further instructions after Dr. Darrick Penna reviews info.

## 2011-08-25 NOTE — ED Notes (Signed)
PER EMS- Called to home because pt reported SOB, Patient originally complained of chest tightness as well. IVR on monitor. 1 mg of Atropine given, patients HR increased to 50's and then decreased back into 30's. Patient is not symptomatic at this time. Alert x4, NAD.

## 2011-08-25 NOTE — ED Notes (Signed)
Patient reports being an alcoholic for many years. States she quit cold [place] approx. 2 weeks ago.

## 2011-08-26 DIAGNOSIS — N179 Acute kidney failure, unspecified: Secondary | ICD-10-CM

## 2011-08-26 DIAGNOSIS — I959 Hypotension, unspecified: Secondary | ICD-10-CM

## 2011-08-26 DIAGNOSIS — I495 Sick sinus syndrome: Secondary | ICD-10-CM

## 2011-08-26 DIAGNOSIS — K7030 Alcoholic cirrhosis of liver without ascites: Secondary | ICD-10-CM

## 2011-08-26 LAB — URINE MICROSCOPIC-ADD ON

## 2011-08-26 LAB — COMPREHENSIVE METABOLIC PANEL
BUN: 11 mg/dL (ref 6–23)
Calcium: 8.7 mg/dL (ref 8.4–10.5)
Creatinine, Ser: 1.07 mg/dL (ref 0.50–1.10)
GFR calc Af Amer: 67 mL/min — ABNORMAL LOW (ref >90)
GFR calc non Af Amer: 58 mL/min — ABNORMAL LOW (ref >90)
Glucose, Bld: 96 mg/dL (ref 70–99)
Sodium: 132 mEq/L — ABNORMAL LOW (ref 135–145)
Total Protein: 6.4 g/dL (ref 6.0–8.3)

## 2011-08-26 LAB — URINALYSIS, ROUTINE W REFLEX MICROSCOPIC
Ketones, ur: 15 mg/dL — AB
Nitrite: POSITIVE — AB
Protein, ur: 30 mg/dL — AB

## 2011-08-26 LAB — CARDIAC PANEL(CRET KIN+CKTOT+MB+TROPI)
CK, MB: 1.6 ng/mL (ref 0.3–4.0)
Total CK: 61 U/L (ref 7–177)

## 2011-08-26 LAB — PROTIME-INR
INR: 2.7 — ABNORMAL HIGH (ref 0.00–1.49)
Prothrombin Time: 29.1 seconds — ABNORMAL HIGH (ref 11.6–15.2)

## 2011-08-26 LAB — SODIUM, URINE, RANDOM: Sodium, Ur: <10 mEq/L

## 2011-08-26 LAB — CBC
HCT: 33.7 % — ABNORMAL LOW (ref 36.0–46.0)
Hemoglobin: 12.3 g/dL (ref 12.0–15.0)
RBC: 3.24 MIL/uL — ABNORMAL LOW (ref 3.87–5.11)
WBC: 11.4 10*3/uL — ABNORMAL HIGH (ref 4.0–10.5)

## 2011-08-26 LAB — GLUCOSE, CAPILLARY: Glucose-Capillary: 90 mg/dL (ref 70–99)

## 2011-08-26 LAB — CREATININE, URINE, RANDOM: Creatinine, Urine: 389.56 mg/dL

## 2011-08-26 MED ORDER — ONDANSETRON HCL 4 MG/2ML IJ SOLN
4.0000 mg | Freq: Four times a day (QID) | INTRAMUSCULAR | Status: DC | PRN
  Administered 2011-08-26: 4 mg via INTRAVENOUS
  Filled 2011-08-26: qty 2

## 2011-08-26 MED ORDER — POTASSIUM CHLORIDE 10 MEQ/100ML IV SOLN
10.0000 meq | INTRAVENOUS | Status: AC
  Administered 2011-08-26 (×3): 10 meq via INTRAVENOUS
  Filled 2011-08-26: qty 200
  Filled 2011-08-26: qty 100

## 2011-08-26 MED ORDER — SODIUM CHLORIDE 0.9 % IV SOLN: Freq: Once | INTRAVENOUS | Status: DC

## 2011-08-26 MED ORDER — ONDANSETRON HCL 4 MG PO TABS: 4.0000 mg | ORAL_TABLET | Freq: Four times a day (QID) | ORAL | Status: DC | PRN

## 2011-08-26 MED ORDER — ESCITALOPRAM OXALATE 10 MG PO TABS
10.0000 mg | ORAL_TABLET | Freq: Every day | ORAL | Status: DC
  Administered 2011-08-26 – 2011-09-05 (×11): 10 mg via ORAL
  Filled 2011-08-26 (×11): qty 1

## 2011-08-26 MED ORDER — SODIUM CHLORIDE 0.9 % IJ SOLN
3.0000 mL | Freq: Two times a day (BID) | INTRAMUSCULAR | Status: DC
  Administered 2011-08-26 (×3): 3 mL via INTRAVENOUS

## 2011-08-26 MED ORDER — TETRAHYDROZOLINE HCL 0.05 % OP SOLN
1.0000 [drp] | Freq: Two times a day (BID) | OPHTHALMIC | Status: DC
  Administered 2011-08-26 – 2011-09-05 (×20): 1 [drp] via OPHTHALMIC
  Filled 2011-08-26 (×2): qty 15

## 2011-08-26 MED ORDER — DIPHENOXYLATE-ATROPINE 2.5-0.025 MG PO TABS: 1.0000 | ORAL_TABLET | Freq: Four times a day (QID) | ORAL | Status: DC | PRN

## 2011-08-26 MED ORDER — SODIUM CHLORIDE 0.9 % IV SOLN
INTRAVENOUS | Status: DC
  Administered 2011-08-26: 16:00:00 via INTRAVENOUS
  Administered 2011-08-27: 75 mL/h via INTRAVENOUS

## 2011-08-26 MED ORDER — CALCIUM CARBONATE ANTACID 500 MG PO CHEW
1.0000 | CHEWABLE_TABLET | ORAL | Status: DC | PRN
  Administered 2011-08-30: 200 mg via ORAL
  Administered 2011-08-30: 400 mg via ORAL
  Administered 2011-08-31 – 2011-09-01 (×2): 200 mg via ORAL
  Administered 2011-09-01 – 2011-09-03 (×3): 400 mg via ORAL
  Administered 2011-09-05: 200 mg via ORAL
  Filled 2011-08-26 (×8): qty 2

## 2011-08-26 MED ORDER — PROMETHAZINE HCL 25 MG/ML IJ SOLN
12.5000 mg | Freq: Four times a day (QID) | INTRAMUSCULAR | Status: DC | PRN
  Administered 2011-08-26: 25 mg via INTRAVENOUS
  Filled 2011-08-26: qty 1

## 2011-08-26 NOTE — Telephone Encounter (Addendum)
CALLED PT AT HOME TO DISCUSS RESULTS FROM CT AND LABS.  NO ANSWER OR ANSWERING MACHINE. CALLED CELL PH#: WRONG NUMBER. RECOMMEND TRANSJUGULAR LIVER Bx ASAP TO EVALUATE FOR AMA NEG PBC, AND THEN WILL START PREDNISOLONE 40 MG QD FOR 28 DAYS THEN TAPER OVER 2 WEEKS FOR ETOH HEPATITIS, & NEEDS EGD/TCS. WILL DISCUSS BENEFITS V. RISKS WITH PT. LAST INR > 2. PT WOULD NEED PT/INR 2 DAYS PRIOR TO EXAM. IF INR > 2 & CANNOT BE CORRECTED WITH VITAMIN K THEN PT NOT A CANDIDATE FOR BIOPSY, POLYPECTOMY, OR PROPHYLACTIC BANDING.

## 2011-08-26 NOTE — H&P (Signed)
PCP:  [name]., MD, MD   Confirmed Dr. Darrick Penna and Tana Coast, PA  in GI  Chief Complaint:  Dizziness, presyncope  HPI: 55yoF with h/o Sjogren's disease, psoriatic arthritis, alcohol abuse, and  diagnosed this month with cirrhosis who comes in with presyncope and found to  have bradycardia, hypoTN, and new ARF.   Pt was recently sent to GI for further evaluation of bowel changes, abdominal  pain, severe nonbloody diarrhea. She was found to have increased LFT's thought  due to alcohol abuse. She was found on ultrasound to have cirrhotic liver, and  difficulty demonstrating blood flow in main portal vein, ? portal vein  thrombosis vs cavernous formation. She was then sent for CT abdomen pelvis which  showed cirrhosis but no PVT or neoplasia; moderate ascites, no splenomegaly,  right colonic thickening with DDx of right colitis vs hypoalbuminemia. Dr.  Darrick Penna planned EGD/colonoscopy.   Pt states she stopped drinking 3 weeks ago, no withdrawal signs. She states 1 wk ago was started on lasix and spironolactone for LE edema and abd distention and is aware of new cirrhosis diagnosis. Then earlier tonight she felt very dizzy, weak, and felt that she was going to pass out, also wtih SOB but no substernal CP or angina. EMS arrived and found her HR in the 20's.   In the ED, pt with persistent bradycardia in the 20's for quite a long time but  apparently moderately stable appearing. She then spontaneously improved to the  50-60's. Initially hypotense as well but this has improved with IVF's. O2 min  89% but now mid-90's. Labs with HCO3 18, worsening renal fxn up to 9/1.3  compared to Cr 0.7 earlier this month. LFT's with AST 141/ALT 64, Tbili 6.0.  Trop negative x2, BNP 675. WBC 13.2 with normal diff, Hgb 12.6 with MCV 106. INR  2.37. CXR with enlarged cardiac silhouette felt due to decresaed lung volumes,  nothing definitely acute. Pt was given 1mg  glucagon, 1g Ca gluconate, and 325    ASA. Cardiology was called, but pt's HR came up to 50's on its own by that time.   ROS as above, pt also with persistent diarrhea, chronic very dry mouth from  Sjogren. She denies abodminal pain. She has poor PO intake, only ginger ale and Goldfish crackers.  ROS otherwise negative.     Past Medical History  Diagnosis Date  . Brain aneurysm   . IBS (irritable bowel syndrome)   . GERD (gastroesophageal reflux disease)   . Panic attack   . Anxiety   . HTN (hypertension)   . Psoriatic arthritis   . ETOH abuse     Quit in 07/2011   . Sjogren's disease   . Cirrhosis     Presumably alcoholic cirrhosis    Past Surgical History  Procedure Date  . Cerebral aneurysm repair     Medications:  HOME MEDS: Reconciled by name with pt  Prior to Admission medications   Medication Sig Start Date End Date Taking? Authorizing Provider  diphenoxylate-atropine (LOMOTIL) 2.5-0.025 MG per tablet Take 1 tablet by mouth every 6 (six) hours as needed. For diarrhea 08/05/11  Yes Historical Provider, MD  escitalopram (LEXAPRO) 10 MG tablet Take 10 mg by mouth daily.     Yes Historical Provider, MD  furosemide (LASIX) 20 MG tablet Take 20 mg by mouth daily.   Yes Historical Provider, MD  metoprolol (TOPROL-XL) 100 MG 24 hr tablet Take 100 mg by mouth daily.    Yes Historical  Provider, MD  Olopatadine HCl (PATANASE NA) Place 1 spray into the nose 2 (two) times daily.   Yes Historical Provider, MD  ondansetron (ZOFRAN) 4 MG tablet Take 4 mg by mouth 4 (four) times daily -  before meals and at bedtime.   Yes Historical Provider, MD  spironolactone (ALDACTONE) 50 MG tablet Take 50 mg by mouth daily.   Yes Historical Provider, MD  Tetrahydrozoline HCl (VISINE OP) Apply 2 drops to eye 2 (two) times daily.   Yes Historical Provider, MD  verapamil (CALAN-SR) 240 MG CR tablet Take 240 mg by mouth daily.   Yes Historical Provider, MD    Allergies:  Allergies  Allergen Reactions  . Omeprazole Shortness Of  Breath  . Phenytoin Rash  . Cephalexin Other (See Comments)    Severe yeast infection    Social History:   reports that she quit smoking about 16 years ago. Her smoking use included Cigarettes. She has a 24 pack-year smoking history. She does not have any smokeless tobacco history on file. She reports that she drinks alcohol. She reports that she does not use illicit drugs.  Bottle of wine a day for years, but quit in 07/2011. Lives at home with husband. Quit smoking in 1997 after smoking for 11-12 years, up to 2PPD. No IVDU.   Family History: Family History  Problem Relation Age of Onset  . Cirrhosis Mother     nash  . Colon cancer Neg Hx   . Inflammatory bowel disease Neg Hx   . Asthma    . Heart disease      Physical Exam: Filed Vitals:   08/25/11 2115 08/25/11 2200 08/25/11 2230 08/25/11 2300  BP: 109/77 111/56 100/55 102/86  Pulse: 52 52 52 52  Resp: 15 17 17 12   SpO2: 98% 96% 98% 98%   Blood pressure 102/86, pulse 52, resp. rate 12, SpO2 98.00%. Gen: Obese, overall stable appearing, pleasant and smiling, nice F in ED  stretcher, husband at bedside, able to relate history pretty well. Mod jaundice. HEENT: Pupils round, reactive, EOMI, scleral icterus obvious. Mouth very dry  appearing, not moist at all, but no apparent lesions noted.  Lungs: Bibasilar inspiratory crackles are noted, and air movement is fair at  best even to command, but no obvious wheezes noted.  Heart: Very hard to heard S1/2, but no apparent m/g heard either. No heaves.  Abd: OBese, distended, but not tight or tender, no grimacing; hypertympanic, no  splenomegaly, some hepatomegaly but no TTP.  Extrem: Cool hands and feet but not cold or cyanotic. Soft pitting edema going  up 1/2 way up calf. Radials palpable. Muscle bulk fair Neuro: Alert, attentive to conversation, CN 2-12 intact, face symmetric, speech  clear and fluent, moves extremities well, can sit up in bed on her own. Grossly  non-focal    Labs & Imaging Results for orders placed during the hospital encounter of 08/25/11 (from the past 48 hour(s))  COMPREHENSIVE METABOLIC PANEL     Status: Abnormal   Collection Time   08/25/11  8:16 PM      Component Value Range Comment   Sodium 131 (*) 135 - 145 (mEq/L)    Potassium 3.9  3.5 - 5.1 (mEq/L)    Chloride 99  96 - 112 (mEq/L)    CO2 18 (*) 19 - 32 (mEq/L)    Glucose, Bld 137 (*) 70 - 99 (mg/dL)    BUN 10  6 - 23 (mg/dL)    Creatinine, Ser 1.61 (*)  0.50 - 1.10 (mg/dL)    Calcium 8.6  8.4 - 10.5 (mg/dL)    Total Protein 7.0  6.0 - 8.3 (g/dL)    Albumin 2.4 (*) 3.5 - 5.2 (g/dL)    AST 161 (*) 0 - 37 (U/L) HEMOLYSIS AT THIS LEVEL MAY AFFECT RESULT   ALT 64 (*) 0 - 35 (U/L)    Alkaline Phosphatase 85  39 - 117 (U/L)    Total Bilirubin 6.0 (*) 0.3 - 1.2 (mg/dL)    GFR calc non Af Amer 50 (*) >90 (mL/min)    GFR calc Af Amer 58 (*) >90 (mL/min)   CBC     Status: Abnormal   Collection Time   08/25/11  8:16 PM      Component Value Range Comment   WBC 13.2 (*) 4.0 - 10.5 (K/uL)    RBC 3.38 (*) 3.87 - 5.11 (MIL/uL)    Hemoglobin 12.6  12.0 - 15.0 (g/dL)    HCT 09.6 (*) 04.5 - 46.0 (%)    MCV 105.9 (*) 78.0 - 100.0 (fL)    MCH 37.3 (*) 26.0 - 34.0 (pg)    MCHC 35.2  30.0 - 36.0 (g/dL)    RDW 40.9 (*) 81.1 - 15.5 (%)    Platelets 176  150 - 400 (K/uL)   DIFFERENTIAL     Status: Abnormal   Collection Time   08/25/11  8:16 PM      Component Value Range Comment   Neutrophils Relative 63  43 - 77 (%)    Neutro Abs 8.3 (*) 1.7 - 7.7 (K/uL)    Lymphocytes Relative 21  12 - 46 (%)    Lymphs Abs 2.8  0.7 - 4.0 (K/uL)    Monocytes Relative 12  3 - 12 (%)    Monocytes Absolute 1.5 (*) 0.1 - 1.0 (K/uL)    Eosinophils Relative 4  0 - 5 (%)    Eosinophils Absolute 0.5  0.0 - 0.7 (K/uL)    Basophils Relative 1  0 - 1 (%)    Basophils Absolute 0.1  0.0 - 0.1 (K/uL)   MAGNESIUM     Status: Normal   Collection Time   08/25/11  8:16 PM      Component Value Range Comment   Magnesium 1.9   1.5 - 2.5 (mg/dL)   PROTIME-INR     Status: Abnormal   Collection Time   08/25/11  8:16 PM      Component Value Range Comment   Prothrombin Time 26.3 (*) 11.6 - 15.2 (seconds)    INR 2.37 (*) 0.00 - 1.49    TROPONIN I     Status: Normal   Collection Time   08/25/11  8:17 PM      Component Value Range Comment   Troponin I <0.30  <0.30 (ng/mL)   PRO B NATRIURETIC PEPTIDE     Status: Abnormal   Collection Time   08/25/11  8:17 PM      Component Value Range Comment   Pro B Natriuretic peptide (BNP) 675.9 (*) 0 - 125 (pg/mL)   POCT I-STAT TROPONIN I     Status: Normal   Collection Time   08/25/11  8:42 PM      Component Value Range Comment   Troponin i, poc 0.01  0.00 - 0.08 (ng/mL)    Comment 3            POCT I-STAT, CHEM 8     Status: Abnormal   Collection Time  08/25/11  8:44 PM      Component Value Range Comment   Sodium 135  135 - 145 (mEq/L)    Potassium 3.8  3.5 - 5.1 (mEq/L)    Chloride 101  96 - 112 (mEq/L)    BUN 9  6 - 23 (mg/dL)    Creatinine, Ser 1.61 (*) 0.50 - 1.10 (mg/dL)    Glucose, Bld 096 (*) 70 - 99 (mg/dL)    Calcium, Ion 0.45 (*) 1.12 - 1.32 (mmol/L)    TCO2 21  0 - 100 (mmol/L)    Hemoglobin 13.9  12.0 - 15.0 (g/dL)    HCT 40.9  81.1 - 91.4 (%)    Dg Chest Portable 1 View  08/25/2011  *RADIOLOGY REPORT*  Clinical Data: Shortness of breath  PORTABLE CHEST - 1 VIEW  Comparison: 01/22/2011  Findings: Apparent enlargement of the cardiac silhouette is favored to be artifactual secondary to decreased lung volumes and projection.  Unchanged mediastinal contours.  Bibasilar opacities favored to represent atelectasis.  No definite pleural effusion or pneumothorax.  Grossly unchanged bones.  IMPRESSION: Apparent enlargement cardiac silhouette is favored to be secondary to decreased lung volumes and projection. No definite acute cardiopulmonary disease.  Further evaluation with a PA and lateral chest radiograph may be obtained as clinically indicated.  Original Report  Authenticated By: Waynard Reeds, M.D.    ECG #1: Wide complex junctional rhythm, HR 30, RAD, no P waves, very wide QRS, Q  waves V1-4, prolonged QRS.   ECG #2: Narrow complex QRS, sinus with some P waves, others without P waves,  much more normal appearing, inferior borderline significant Q waves but not  precordially, no frank ST deviations, diffusely flat T waves.   ECG #3: NSR 54, normal axis, no ST deviations, flat T waves , much more normal  appearing.    Impression Present on Admission:  .Cirrhosis, alcoholic .Bradycardia .Hypotension .Medication adverse effect .Acute renal failure  55yoF with h/o Sjogren's disease, psoriatic arthritis, alcohol abuse, and  diagnosed this month with cirrhosis who comes in with presyncope and found to  have bradycardia, hypoTN, and new ARF.  1. Bradycardia, hypoTN: Now improved to the 50's and SBP >100. I suspect her  story is that GI doc reasonably prescribed new lasix and spironolactone a week  ago, putting her in renal failure, and she continued to take metoprolol and  verapamil in setting of renal and liver failure. Cardiac enzymes negative x1 and  no chest pain makes a right coronary MI much less likely.   - Admit SDU for monitoring.  - Holding all diuretics and nodal agents. Cardiac enzymes, trend ECG  2. Renal failure: Cr now 1.2-1.3 with prior 0.7 on 5/13, GFR 50. As above,  suspect diuretic effect, however in combination with liver failure, this would  be worrisome for hepatorenal syndrome which is often a Dx of exclusion.   - UA, urine lytes, trial of IVF's and trend BMET, if not improving may need to  consider GI/liver consultation, albumin challenge.  - Pt is getting 1L of NS at present, will finish this bag then stop given volume  overload.   3. Liver failure, cirrhosis: Presumably due to alcohol but given h/o psoriatic  arthrtis and Sjogren's, ? autoimmune. Regardless, this seems stable as long as  renal fxn improves,  and can f/u with outpt Dr. Darrick Penna on d/c.   4. Continue lexapro, eye drops, lomotil  No DVT prophy, INR >2 SDU, [facility] team 1 Presumed full  code   Other plans as per orders.   Dosia Yodice 08/26/2011, 12:03 AM

## 2011-08-26 NOTE — ED Provider Notes (Signed)
CRITICAL CARE Performed by: Gwyneth Sprout   Total critical care time: 30  Critical care time was exclusive of separately billable procedures and treating other patients.  Critical care was necessary to treat or prevent imminent or life-threatening deterioration.  Critical care was time spent personally by me on the following activities: development of treatment plan with patient and/or surrogate as well as nursing, discussions with consultants, evaluation of patient's response to treatment, examination of patient, obtaining history from patient or surrogate, ordering and performing treatments and interventions, ordering and review of laboratory studies, ordering and review of radiographic studies, pulse oximetry and re-evaluation of patient's condition.  I saw and evaluated the patient, reviewed the resident's note and I agree with the findings and plan. I saw the EKG and agree with the residents interpretation.  Pt with recent hx of liver disease who presents due to feeling SOB and dizzy and found to have a HR of 25.  Pt is awake and alert and did not require pacing.  No improvement with glucagon, Ca and only transient change with adenosine. Pt recently started on new bp meds and with liver dx probably delayed clearance.  Gwyneth Sprout, MD 08/26/11 2159

## 2011-08-26 NOTE — Care Management Note (Signed)
    Page 1 of 1   08/26/2011     8:52:35 AM   CARE MANAGEMENT NOTE 08/26/2011  Patient:  Deanna Thompson, Deanna Thompson   Account Number:  [id number]  Date Initiated:  08/26/2011  Documentation initiated by:  Junius Creamer  Subjective/Objective Assessment:   adm w bradycardia     Action/Plan:   lives w spouse, pcp dr Lyman Bishop fusco   Anticipated DC Date:  08/28/2011   Anticipated DC Plan:  HOME/SELF CARE      DC Planning Services  CM consult      Choice offered to / List presented to:             Status of service:   Medicare Important Message given?   (If response is "NO", the following Medicare IM given date fields will be blank) Date Medicare IM given:   Date Additional Medicare IM given:    Discharge Disposition:  HOME/SELF CARE  Per UR Regulation:  Reviewed for med. necessity/level of care/duration of stay  If discussed at Long Length of Stay Meetings, dates discussed:    Comments:  5/29 debbie Deanna Lusby rn,bsn [phone]

## 2011-08-26 NOTE — Progress Notes (Signed)
PT Cancellation Note  Evaluation cancelled today due to pt on strict bedrest.  Deanna Thompson 08/26/2011, 3:15 PM Pager [phone]

## 2011-08-26 NOTE — Assessment & Plan Note (Signed)
PRESUMED, BUT PT HAD CONNECTIVE TISSUES DISEASE AS WELL. THE DIFFERENTIAL DIAGNOSIS INCLUDES AUTOIMMUNE LIVER DISEASE, LESS LIKELY WILSON'S OR A1AT DEFICIENCY. PT HAS NO IMMUNITY TO HEP A  OR HEP B.    SHE SHOULD EAT THREE MEALS A DAY. IF SHE DOES NOT FEEL LIKE EATING, SUBSTITUTE/DRINK BOOST, ENSURE, OR CARNATION INSTANT BREAKFAST WITH SOY MILK FOR YOUR MEAL. FOLLOW  LOW FAT DIET. HO GIVEN.  TAKE ALDACTONE AND LASIX EVERY MORNING.  WEIGH EVERY MORNING. PT SHOULD CALL ME IN ONE WEEK WITH HER WEIGHTS.  ZOFRAN 30 MINUTES PRIOR TO MEALS TO PREVENT NAUSEA AND DRY HEAVES. ACIPHEX DAILY TO CONTROL REFLUX. LOMOTIL AS NEEDED FOR LOOSE STOOLS. MAY USE TYLENOL 325 MG 2 EVERY 8 HOURS AS NEEDED FOR PAIN.  CONTINUE WITH EFFORTS TO AVOID ALCOHOL.  HAVE LABS DRAWN TO COMPLETE THE EVALUATION FOR LIVER DISEASE.  SEE MR. SUAREZ TO START THE HEPATITIS A AND B VACCINE.  FOLLOW UP IN 6 WEEKS.

## 2011-08-26 NOTE — Progress Notes (Signed)
[facility] [place] TEAM 1 - Stepdown/ICU TEAM  PCP:  [name]., MD, MD  Subjective: 55yoF with h/o Sjogren's disease, psoriatic arthritis, alcohol abuse, and  diagnosed this month with cirrhosis who comes in with presyncope and found to  have bradycardia, hypotension, and new ARF.   Pt seen for f/u visit.  Objective:  Intake/Output Summary (Last 24 hours) at 08/26/11 1513 Last data filed at 08/26/11 1400  Gross per 24 hour  Intake    730 ml  Output    250 ml  Net    480 ml   Blood pressure 99/58, pulse 66, temperature 97.6 F (36.4 C), temperature source Oral, resp. rate 15, height 5\' 8"  (1.727 m), weight 127.3 kg (280 lb 10.3 oz), SpO2 100.00%.  CBG (last 3)   Basename 08/26/11 0837  GLUCAP 90   Physical Exam: F/u exam completed  Lab Results:  Basename 08/26/11 0528 08/25/11 2044 08/25/11 2016  NA 132* 135 131*  K 3.3* 3.8 3.9  CL 97 101 99  CO2 22 -- 18*  GLUCOSE 96 137* 137*  BUN 11 9 10   CREATININE 1.07 1.30* 1.21*  CALCIUM 8.7 -- 8.6  MG -- -- 1.9  PHOS -- -- --    [facility] 08/26/11 0528 08/25/11 2016  AST 112* 141*  ALT 57* 64*  ALKPHOS 88 85  BILITOT 5.5* 6.0*  PROT 6.4 7.0  ALBUMIN 2.2* 2.4*    Basename 08/26/11 0528 08/25/11 2044 08/25/11 2016  WBC 11.4* -- 13.2*  NEUTROABS -- -- 8.3*  HGB 12.3 13.9 12.6  HCT 33.7* 41.0 35.8*  MCV 104.0* -- 105.9*  PLT 154 -- 176    Basename 08/26/11 0528 08/25/11 2017  CKTOTAL 61 --  CKMB 1.6 --  CKMBINDEX -- --  TROPONINI <0.30 <0.30   No results found for this basename: HGBA1C:12 in the last 72 hours  Micro Results: Recent Results (from the past 240 hour(s))  MRSA PCR SCREENING     Status: Normal   Collection Time   08/26/11  2:08 AM      Component Value Range Status Comment   MRSA by PCR NEGATIVE  NEGATIVE  Final     Studies/Results: All recent x-ray/radiology reports have been reviewed in detail.   Medications: I have reviewed the patient's complete medication  list.  Assessment/Plan:  Cirrhosis, alcoholic  Bradycardia w/ presyncope  Hypotension w/ presyncope  Acute renal failure  Intractable vomiting  Sjogren's   Lonia Blood, MD [facility] Office  [phone] Pager [phone]  On-Call/Text Page:      [email or web address]      password [facility]

## 2011-08-27 ENCOUNTER — Telehealth: Payer: Self-pay | Admitting: Gastroenterology

## 2011-08-27 ENCOUNTER — Encounter (HOSPITAL_COMMUNITY): Payer: Self-pay | Admitting: Radiology

## 2011-08-27 DIAGNOSIS — D72829 Elevated white blood cell count, unspecified: Secondary | ICD-10-CM | POA: Diagnosis present

## 2011-08-27 DIAGNOSIS — F05 Delirium due to known physiological condition: Secondary | ICD-10-CM

## 2011-08-27 DIAGNOSIS — K7460 Unspecified cirrhosis of liver: Secondary | ICD-10-CM

## 2011-08-27 DIAGNOSIS — N179 Acute kidney failure, unspecified: Secondary | ICD-10-CM

## 2011-08-27 DIAGNOSIS — R410 Disorientation, unspecified: Secondary | ICD-10-CM | POA: Diagnosis present

## 2011-08-27 DIAGNOSIS — D689 Coagulation defect, unspecified: Secondary | ICD-10-CM | POA: Diagnosis present

## 2011-08-27 DIAGNOSIS — R188 Other ascites: Secondary | ICD-10-CM | POA: Diagnosis present

## 2011-08-27 DIAGNOSIS — I495 Sick sinus syndrome: Secondary | ICD-10-CM

## 2011-08-27 DIAGNOSIS — R600 Localized edema: Secondary | ICD-10-CM | POA: Diagnosis present

## 2011-08-27 LAB — MAGNESIUM: Magnesium: 2 mg/dL (ref 1.5–2.5)

## 2011-08-27 LAB — BASIC METABOLIC PANEL
CO2: 24 mEq/L (ref 19–32)
Calcium: 9.1 mg/dL (ref 8.4–10.5)
GFR calc non Af Amer: >90 mL/min (ref >90)
Glucose, Bld: 125 mg/dL — ABNORMAL HIGH (ref 70–99)
Potassium: 3.3 mEq/L — ABNORMAL LOW (ref 3.5–5.1)
Sodium: 134 mEq/L — ABNORMAL LOW (ref 135–145)

## 2011-08-27 LAB — AMMONIA: Ammonia: 68 umol/L — ABNORMAL HIGH (ref 11–60)

## 2011-08-27 MED ORDER — SPIRONOLACTONE 50 MG PO TABS
50.0000 mg | ORAL_TABLET | Freq: Two times a day (BID) | ORAL | Status: DC
  Administered 2011-08-27 – 2011-09-02 (×12): 50 mg via ORAL
  Filled 2011-08-27 (×16): qty 1

## 2011-08-27 MED ORDER — VITAMIN K1 10 MG/ML IJ SOLN
10.0000 mg | Freq: Every day | INTRAMUSCULAR | Status: DC
  Administered 2011-08-27 – 2011-08-28 (×2): 10 mg via SUBCUTANEOUS
  Filled 2011-08-27 (×2): qty 1

## 2011-08-27 MED ORDER — LACTULOSE 10 GM/15ML PO SOLN
60.0000 g | Freq: Once | ORAL | Status: AC
  Administered 2011-08-27: 60 g via ORAL
  Filled 2011-08-27: qty 90

## 2011-08-27 MED ORDER — FUROSEMIDE 10 MG/ML IJ SOLN
40.0000 mg | Freq: Two times a day (BID) | INTRAMUSCULAR | Status: DC
Start: 2011-08-27 — End: 2011-08-27
  Filled 2011-08-27 (×2): qty 4

## 2011-08-27 MED ORDER — LACTULOSE 10 GM/15ML PO SOLN
30.0000 g | Freq: Three times a day (TID) | ORAL | Status: DC
  Administered 2011-08-27 – 2011-08-31 (×10): 30 g via ORAL
  Filled 2011-08-27 (×13): qty 45

## 2011-08-27 MED ORDER — FUROSEMIDE 10 MG/ML IJ SOLN
20.0000 mg | Freq: Two times a day (BID) | INTRAMUSCULAR | Status: DC
  Administered 2011-08-27 – 2011-08-28 (×3): 20 mg via INTRAVENOUS
  Filled 2011-08-27 (×4): qty 2

## 2011-08-27 MED ORDER — SODIUM CHLORIDE 0.9 % IV SOLN
INTRAVENOUS | Status: DC
  Administered 2011-08-27: 20 mL/h via INTRAVENOUS

## 2011-08-27 NOTE — Telephone Encounter (Signed)
Note from Dr. Darrick Penna, she has already spoken to the pt's husband.

## 2011-08-27 NOTE — Evaluation (Signed)
Physical Therapy Evaluation Patient Details Name: Deanna Thompson MRN: [id number] DOB: [birth year] Today's Date: 08/27/2011 Time: 0865-7846 PT Time Calculation (min): 30 min  PT Assessment / Plan / Recommendation Clinical Impression  Pt is a 56 y/o female with complex medical history including: Sjogren's disease, psoriatic arthritis, alcohol abuse and BPPV, who was recently diagnosed with ARF.  Pt rambles when ask a direct question but answers appropriately. Pt will benefit from acute PT follow-up to address decreased mobility and impaired balance.  Pt may be a candidate for CIR for neuromuscular re-education and balance training.      PT Assessment  Patient needs continued PT services    Follow Up Recommendations  Inpatient Rehab;Supervision/Assistance - 24 hour    Barriers to Discharge None      lEquipment Recommendations  Defer to next venue    Recommendations for Other Services Rehab consult;OT consult   Frequency Min 3X/week    Precautions / Restrictions Precautions Precautions: Fall Restrictions Weight Bearing Restrictions: No   Pertinent Vitals/Pain Pt denied pain at this time.         Mobility  Bed Mobility Bed Mobility: Rolling Right;Right Sidelying to Sit;Sit to Supine Rolling Right: 6: Modified independent (Device/Increase time) Right Sidelying to Sit: 6: Modified independent (Device/Increase time);With rails Sit to Supine: 7: Independent Transfers Transfers: Sit to Stand;Stand to Sit;Stand Pivot Transfers Sit to Stand: From bed;With upper extremity assist;4: Min assist Stand to Sit: 4: Min assist Stand Pivot Transfers: 4: Min assist Details for Transfer Assistance: Pt very unsteady on her feet. HHA to stabilize pt in standing.   Ambulation/Gait Ambulation/Gait Assistance: 4: Min assist Ambulation Distance (Feet): 15 Feet Assistive device: 1 person hand held assist Ambulation/Gait Assistance Details: Assist to stabilize pt due to balance deficits. Gait  Pattern: Decreased stride length;Decreased hip/knee flexion - right;Decreased hip/knee flexion - left Stairs: No Wheelchair Mobility Wheelchair Mobility: No    Exercises     PT Diagnosis: Difficulty walking;Altered mental status  PT Problem List: Decreased activity tolerance;Decreased balance;Decreased mobility;Obesity;Decreased knowledge of use of DME PT Treatment Interventions: DME instruction;Gait training;Stair training;Functional mobility training;Therapeutic activities;Balance training;Neuromuscular re-education;Patient/family education   PT Goals Acute Rehab PT Goals PT Goal Formulation: With patient Time For Goal Achievement: 09/10/11 Potential to Achieve Goals: Good Pt will Transfer Bed to Chair/Chair to Bed: Independently PT Transfer Goal: Bed to Chair/Chair to Bed - Progress: Goal set today Pt will Stand: Independently;3 - 5 min;with no upper extremity support PT Goal: Stand - Progress: Goal set today Pt will Ambulate: 51 - 150 feet;with supervision;with least restrictive assistive device PT Goal: Ambulate - Progress: Goal set today Pt will Go Up / Down Stairs: 3-5 stairs;with supervision;with rail(s) PT Goal: Up/Down Stairs - Progress: Goal set today  Visit Information  Last PT Received On: 08/27/11    Subjective Data  Subjective: I have vertigo, I stopped drinking (alchohol) 3 weeks ago Patient Stated Goal: return to home independent.    Prior Functioning  Home Living Lives With: Spouse Available Help at Discharge: Available PRN/intermittently Type of Home: House Home Access: Stairs to enter [employer] of Steps: 5 Entrance Stairs-Rails: Right Home Layout: One level Bathroom Shower/Tub: Forensic scientist: Standard Bathroom Accessibility: Yes How Accessible: Accessible via walker;Accessible via wheelchair Home Adaptive Equipment: Grab bars in shower;Grab bars around toilet;Shower chair with back;Walker - rolling;Wheelchair -  manual Prior Function Level of Independence: Independent Able to Take Stairs?: Yes Driving: Yes Vocation: Retired Musician: No difficulties Dominant Hand: Right    Cognition  Overall Cognitive Status: Appears within functional limits for tasks assessed/performed Arousal/Alertness: Awake/alert Orientation Level: Appears intact for tasks assessed Behavior During Session: [facility] for tasks performed    Extremity/Trunk Assessment Right Upper Extremity Assessment RUE ROM/Strength/Tone: Within functional levels Left Upper Extremity Assessment LUE ROM/Strength/Tone: Within functional levels Right Lower Extremity Assessment RLE ROM/Strength/Tone: Within functional levels RLE Sensation: History of peripheral neuropathy RLE Coordination: WFL - gross motor Left Lower Extremity Assessment LLE ROM/Strength/Tone: Within functional levels LLE Sensation: History of peripheral neuropathy LLE Coordination: WFL - gross motor Trunk Assessment Trunk Assessment: Normal   Balance Balance Balance Assessed: Yes Static Sitting Balance Static Sitting - Balance Support: No upper extremity supported;Feet supported Static Sitting - Level of Assistance: 7: Independent Static Sitting - Comment/# of Minutes: Sat on EOB x 3+ minutes with no LOB, C/O mild dizziness.   Static Standing Balance Static Standing - Balance Support: No upper extremity supported Static Standing - Level of Assistance: 4: Min assist Static Standing - Comment/# of Minutes: <1 minute with assist to shift wt forward as pt presents with posterior lean.  C/o dizziness but no worse than sitting.    End of Session PT - End of Session Equipment Utilized During Treatment: Gait belt Activity Tolerance: Treatment limited secondary to medical complications (Comment) (Dizziness) Patient left: in bed;with call bell/phone within reach;with bed alarm set;with nursing in room Nurse Communication: Mobility status    Deanna Thompson 08/27/2011, 4:55 PM Deanna Thompson L. Hubbert Landrigan DPT [phone]

## 2011-08-27 NOTE — Progress Notes (Addendum)
Patient ID: Deanna Thompson, female   DOB: 27-Dec-1955, 56 y.o.   MRN: [id number]  SPOKE WITH DR. Butler Denmark. EXPLAINED PT NEEDS TRANSJUGULAR BIOPSY DUE TO POSSIBILITY OF AUTOIMMUNE LIVER DISEASE. PT'S INR 2.3 TO 2.7 AND SHE HAS HAD POOR PO INTAKE. MAY BENEFIT FROM VITAMIN K 10 MG IV QD FOR 3 DAYS PRIOR TO LIVER BIOPSY. IF DISCRIMINANT FUNCTION STILL > 32. PLAN FOR ADDING PREDNISOLONE IF NO EVIDENCE OF INFECTION AFTER LIVER BIOPSY IS COMPLETE.

## 2011-08-27 NOTE — Telephone Encounter (Signed)
Sent reminder pager.

## 2011-08-27 NOTE — Telephone Encounter (Signed)
Sent pager message to Dr. Fields.  

## 2011-08-27 NOTE — Interval H&P Note (Cosign Needed)
History and Physical Interval Note:  08/27/2011 2:30 PM  [place]  Has been admitted with N/V, and altered mental status. She has a hx of EtOH cirrhosis but also has the possibility of autoimmune disease given he rmedical history. She is followed and worked up by Dr. Darrick Penna in [place]. Dr.Fields is aware of her admission and has requested if Transjugular liver biopsy could be done while the pt is here.  She is coagulopathic and the primary service is going to give her some VitK today. The various methods of treatment have been discussed with the patient and family. After consideration of risks, benefits and other options for treatment, the patient has consented to image guided transjugular liver biopsy.  The patients' history has been reviewed, patient examined, no change in status, stable for procedure.  I have reviewed the patients' chart and labs.  Questions were answered to the patient's satisfaction.   BP 140/74  Pulse 77  Temp(Src) 98.3 F (36.8 C) (Oral)  Resp 18  Ht 5\' 8"  (1.727 m)  Wt 279 lb 12.2 oz (126.9 kg)  BMI 42.54 kg/m2  SpO2 96% Gen: confused obese female, but in NAD, nontoxic appearing. ENT: unremarkable Lungs: CTA without w/r/r Heart: Regular Abdomen: markedly distended but not tender. Hypoactive BS. Neuro: majority of conversation made sense but several times pt off on tangent. Otherwise no gross deficits. Judgement not normal as pt believes she could go home  Results for orders placed during the hospital encounter of 08/25/11  COMPREHENSIVE METABOLIC PANEL      Component Value Range   Sodium 131 (*) 135 - 145 (mEq/L)   Potassium 3.9  3.5 - 5.1 (mEq/L)   Chloride 99  96 - 112 (mEq/L)   CO2 18 (*) 19 - 32 (mEq/L)   Glucose, Bld 137 (*) 70 - 99 (mg/dL)   BUN 10  6 - 23 (mg/dL)   Creatinine, Ser 4.78 (*) 0.50 - 1.10 (mg/dL)   Calcium 8.6  8.4 - 29.5 (mg/dL)   Total Protein 7.0  6.0 - 8.3 (g/dL)   Albumin 2.4 (*) 3.5 - 5.2 (g/dL)   AST 621 (*) 0 - 37  (U/L)   ALT 64 (*) 0 - 35 (U/L)   Alkaline Phosphatase 85  39 - 117 (U/L)   Total Bilirubin 6.0 (*) 0.3 - 1.2 (mg/dL)   GFR calc non Af Amer 50 (*) >90 (mL/min)   GFR calc Af Amer 58 (*) >90 (mL/min)  CBC      Component Value Range   WBC 13.2 (*) 4.0 - 10.5 (K/uL)   RBC 3.38 (*) 3.87 - 5.11 (MIL/uL)   Hemoglobin 12.6  12.0 - 15.0 (g/dL)   HCT 30.8 (*) 65.7 - 46.0 (%)   MCV 105.9 (*) 78.0 - 100.0 (fL)   MCH 37.3 (*) 26.0 - 34.0 (pg)   MCHC 35.2  30.0 - 36.0 (g/dL)   RDW 84.6 (*) 96.2 - 15.5 (%)   Platelets 176  150 - 400 (K/uL)  DIFFERENTIAL      Component Value Range   Neutrophils Relative 63  43 - 77 (%)   Neutro Abs 8.3 (*) 1.7 - 7.7 (K/uL)   Lymphocytes Relative 21  12 - 46 (%)   Lymphs Abs 2.8  0.7 - 4.0 (K/uL)   Monocytes Relative 12  3 - 12 (%)   Monocytes Absolute 1.5 (*) 0.1 - 1.0 (K/uL)   Eosinophils Relative 4  0 - 5 (%)   Eosinophils Absolute 0.5  0.0 -  0.7 (K/uL)   Basophils Relative 1  0 - 1 (%)   Basophils Absolute 0.1  0.0 - 0.1 (K/uL)  TROPONIN I      Component Value Range   Troponin I <0.30  <0.30 (ng/mL)  PRO B NATRIURETIC PEPTIDE      Component Value Range   Pro B Natriuretic peptide (BNP) 675.9 (*) 0 - 125 (pg/mL)  MAGNESIUM      Component Value Range   Magnesium 1.9  1.5 - 2.5 (mg/dL)  TSH      Component Value Range   TSH 8.256 (*) 0.350 - 4.500 (uIU/mL)  T4, FREE      Component Value Range   Free T4 1.11  0.80 - 1.80 (ng/dL)  PROTIME-INR      Component Value Range   Prothrombin Time 26.3 (*) 11.6 - 15.2 (seconds)   INR 2.37 (*) 0.00 - 1.49   POCT I-STAT, CHEM 8      Component Value Range   Sodium 135  135 - 145 (mEq/L)   Potassium 3.8  3.5 - 5.1 (mEq/L)   Chloride 101  96 - 112 (mEq/L)   BUN 9  6 - 23 (mg/dL)   Creatinine, Ser 1.61 (*) 0.50 - 1.10 (mg/dL)   Glucose, Bld 096 (*) 70 - 99 (mg/dL)   Calcium, Ion 0.45 (*) 1.12 - 1.32 (mmol/L)   TCO2 21  0 - 100 (mmol/L)   Hemoglobin 13.9  12.0 - 15.0 (g/dL)   HCT 40.9  81.1 - 91.4 (%)  POCT  I-STAT TROPONIN I      Component Value Range   Troponin i, poc 0.01  0.00 - 0.08 (ng/mL)   Comment 3           MRSA PCR SCREENING      Component Value Range   MRSA by PCR NEGATIVE  NEGATIVE   CARDIAC PANEL(CRET KIN+CKTOT+MB+TROPI)      Component Value Range   Total CK 61  7 - 177 (U/L)   CK, MB 1.6  0.3 - 4.0 (ng/mL)   Troponin I <0.30  <0.30 (ng/mL)   Relative Index RELATIVE INDEX IS INVALID  0.0 - 2.5   COMPREHENSIVE METABOLIC PANEL      Component Value Range   Sodium 132 (*) 135 - 145 (mEq/L)   Potassium 3.3 (*) 3.5 - 5.1 (mEq/L)   Chloride 97  96 - 112 (mEq/L)   CO2 22  19 - 32 (mEq/L)   Glucose, Bld 96  70 - 99 (mg/dL)   BUN 11  6 - 23 (mg/dL)   Creatinine, Ser 7.82  0.50 - 1.10 (mg/dL)   Calcium 8.7  8.4 - 95.6 (mg/dL)   Total Protein 6.4  6.0 - 8.3 (g/dL)   Albumin 2.2 (*) 3.5 - 5.2 (g/dL)   AST 213 (*) 0 - 37 (U/L)   ALT 57 (*) 0 - 35 (U/L)   Alkaline Phosphatase 88  39 - 117 (U/L)   Total Bilirubin 5.5 (*) 0.3 - 1.2 (mg/dL)   GFR calc non Af Amer 58 (*) >90 (mL/min)   GFR calc Af Amer 67 (*) >90 (mL/min)  CBC      Component Value Range   WBC 11.4 (*) 4.0 - 10.5 (K/uL)   RBC 3.24 (*) 3.87 - 5.11 (MIL/uL)   Hemoglobin 12.3  12.0 - 15.0 (g/dL)   HCT 08.6 (*) 57.8 - 46.0 (%)   MCV 104.0 (*) 78.0 - 100.0 (fL)   MCH 38.0 (*) 26.0 - 34.0 (  pg)   MCHC 36.5 (*) 30.0 - 36.0 (g/dL)   RDW 16.1  09.6 - 04.5 (%)   Platelets 154  150 - 400 (K/uL)  PROTIME-INR      Component Value Range   Prothrombin Time 29.1 (*) 11.6 - 15.2 (seconds)   INR 2.70 (*) 0.00 - 1.49   CREATININE, URINE, RANDOM      Component Value Range   Creatinine, Urine 389.56    OSMOLALITY, URINE      Component Value Range   Osmolality, Ur 390  390 - 1090 (mOsm/kg)  SODIUM, URINE, RANDOM      Component Value Range   Sodium, Ur <10    URINALYSIS, ROUTINE W REFLEX MICROSCOPIC      Component Value Range   Color, Urine AMBER (*) YELLOW    APPearance CLOUDY (*) CLEAR    Specific Gravity, Urine 1.023   1.005 - 1.030    pH 5.0  5.0 - 8.0    Glucose, UA NEGATIVE  NEGATIVE (mg/dL)   Hgb urine dipstick NEGATIVE  NEGATIVE    Bilirubin Urine LARGE (*) NEGATIVE    Ketones, ur 15 (*) NEGATIVE (mg/dL)   Protein, ur 30 (*) NEGATIVE (mg/dL)   Urobilinogen, UA 2.0 (*) 0.0 - 1.0 (mg/dL)   Nitrite POSITIVE (*) NEGATIVE    Leukocytes, UA MODERATE (*) NEGATIVE   URINE MICROSCOPIC-ADD ON      Component Value Range   Squamous Epithelial / LPF MANY (*) RARE    WBC, UA 3-6  <3 (WBC/hpf)   RBC / HPF 0-2  <3 (RBC/hpf)   Bacteria, UA MANY (*) RARE    Casts HYALINE CASTS (*) NEGATIVE   GLUCOSE, CAPILLARY      Component Value Range   Glucose-Capillary 90  70 - 99 (mg/dL)  BASIC METABOLIC PANEL      Component Value Range   Sodium 134 (*) 135 - 145 (mEq/L)   Potassium 3.3 (*) 3.5 - 5.1 (mEq/L)   Chloride 96  96 - 112 (mEq/L)   CO2 24  19 - 32 (mEq/L)   Glucose, Bld 125 (*) 70 - 99 (mg/dL)   BUN 13  6 - 23 (mg/dL)   Creatinine, Ser 4.09  0.50 - 1.10 (mg/dL)   Calcium 9.1  8.4 - 81.1 (mg/dL)   GFR calc non Af Amer >90  >90 (mL/min)   GFR calc Af Amer >90  >90 (mL/min)  MAGNESIUM      Component Value Range   Magnesium 2.0  1.5 - 2.5 (mg/dL)  AMMONIA      Component Value Range   Ammonia 68 (*) 11 - 60 (umol/L)   Impression/Plan: Liver failure Cirrhosis-presumably from EtOH Recheck coags in am. Plan to tentatively proceed with Transjugular liver biopsy tomorrow  Seen for and discussed with Dr. Isa Rankin, [facility]

## 2011-08-27 NOTE — Telephone Encounter (Signed)
Patients husband wants to speak to Dr. Darrick Penna or PA, Patient is in ICU in [facility], for renal failure from medications. Patients husband is concerned about the nausea & vomiting shes having and not able to eat or drink.

## 2011-08-27 NOTE — Progress Notes (Addendum)
[facility]  Interim history: 55yoF with h/o Sjogren's disease, psoriatic arthritis, alcohol abuse who was  diagnosed this month with cirrhosis. She comes in with presyncope and found to  have bradycardia (HR 20-30) hypoTN, and new ARF. She was given Glucagon & Calcium in the ER. HR improved to the 50s. Her B blocker and Verapamil was held. She was started on Lasix and Aldactone about 1 week ago and was going to receive further GI w/u. Dr Darrick Penna called me today and asked to order a transjugular liver biopsy while she is here- please see her note from today.   Subjective: She is alert but quite confused and thinks he neice is in the room with her. She states she is at "[employer] and [employer]   Objective: Blood pressure 140/74, pulse 77, temperature 98.3 F (36.8 C), temperature source Oral, resp. rate 18, height 5\' 8"  (1.727 m), weight 126.9 kg (279 lb 12.2 oz), SpO2 96.00%. Weight change: -0.4 kg (-14.1 oz)  Intake/Output Summary (Last 24 hours) at 08/27/11 1544 Last data filed at 08/27/11 1300  Gross per 24 hour  Intake   2210 ml  Output    850 ml  Net   1360 ml    Physical Exam: General appearance: alert, cooperative, no distress and confused x 2 Throat: lips, mucosa, and tongue dry Lungs: mild crackles at bases Heart: regular rate and rhythm, S1, S2 normal Abdomen: soft, non-tender; bowel sounds normal; moderately distended and dull to precussion Extremities: extremities normal, atraumatic, no cyanosis- 2 + pitting edema  Lab Results:  [facility] 08/27/11 0603 08/26/11 0528 08/25/11 2016  NA 134* 132* --  K 3.3* 3.3* --  CL 96 97 --  CO2 24 22 --  GLUCOSE 125* 96 --  BUN 13 11 --  CREATININE 0.72 1.07 --  CALCIUM 9.1 8.7 --  MG 2.0 -- 1.9  PHOS -- -- --    [facility] 08/26/11 0528 08/25/11 2016  AST 112* 141*  ALT 57* 64*  ALKPHOS 88 85  BILITOT 5.5* 6.0*  PROT 6.4 7.0  ALBUMIN 2.2* 2.4*   No results found for this basename: LIPASE:2,AMYLASE:2 in the  last 72 hours  Basename 08/26/11 0528 08/25/11 2044 08/25/11 2016  WBC 11.4* -- 13.2*  NEUTROABS -- -- 8.3*  HGB 12.3 13.9 --  HCT 33.7* 41.0 --  MCV 104.0* -- 105.9*  PLT 154 -- 176    Basename 08/26/11 0528 08/25/11 2017  CKTOTAL 61 --  CKMB 1.6 --  CKMBINDEX -- --  TROPONINI <0.30 <0.30   No components found with this basename: POCBNP:3 No results found for this basename: DDIMER:2 in the last 72 hours No results found for this basename: HGBA1C:2 in the last 72 hours No results found for this basename: CHOL:2,HDL:2,LDLCALC:2,TRIG:2,CHOLHDL:2,LDLDIRECT:2 in the last 72 hours  Basename 08/25/11 2016  TSH 8.256*  T4TOTAL --  T3FREE --  THYROIDAB --   No results found for this basename: VITAMINB12:2,FOLATE:2,FERRITIN:2,TIBC:2,IRON:2,RETICCTPCT:2 in the last 72 hours  Micro Results: Recent Results (from the past 240 hour(s))  MRSA PCR SCREENING     Status: Normal   Collection Time   08/26/11  2:08 AM      Component Value Range Status Comment   MRSA by PCR NEGATIVE  NEGATIVE  Final     Studies/Results: US Abdomen Complete  08/13/2011  *RADIOLOGY REPORT*  Clinical Data:  Abnormal liver function studies.  COMPLETE ABDOMINAL ULTRASOUND  Comparison:  None  Findings:  Gallbladder:  Mild distention of the gallbladder is  noted.  There is some echogenic debris and possible small gallstones.  No wall thickening or sonographic Murphy's sign to suggest acute cholecystitis.  Common bile duct:  Normal in caliber measuring a maximum of 5.17mm.  Liver:  Heterogeneous increased echogenicity and nodular contour consistent with cirrhosis.  No focal hepatic lesions are identified.  No intrahepatic biliary dilatation.  Difficulty demonstrating blood flow flow in the main portal vein. Possible portal vein thrombosis or cavernous transformation.  IVC:  Normal caliber.  Pancreas:  Limited visualization due to overlying bowel gas and body habitus.  Spleen:  Normal in size and echogenicity without focal  lesions.  Right Kidney:  12.1 cm in length. Normal renal cortical thickness and echogenicity without focal lesions or hydronephrosis.  Left Kidney:  12.5 cm in length. Normal renal cortical thickness and echogenicity without focal lesions or hydronephrosis.  Abdominal aorta:  Normal caliber.  Additional findings:  Moderate volume ascites.  IMPRESSION:  1.  Cirrhotic changes involving the liver without focal hepatic lesion. 2.  Poorly demonstrated blood flow in the main portal vein. Possible thrombosis or cavernous transformation. 3.  The spleen is upper limits of normal in size. 4.  Moderate volume ascites. 5.  Mildly distended gallbladder with sludge and small stones but no findings for acute cholecystitis. 6.  Normal caliber common bile duct.  Original Report Authenticated By: P. Loralie Champagne, M.D.   Ct Abdomen Pelvis W Contrast  08/20/2011  *RADIOLOGY REPORT*  Clinical Data: Cirrhosis.  Cholelithiasis.  Possible portal vein thrombosis on abdominal ultrasound.  CT ABDOMEN AND PELVIS WITH CONTRAST  Technique:  Multidetector CT imaging of the abdomen and pelvis was performed following the standard protocol during bolus administration of intravenous contrast.  Contrast: OMNIPAQUE IOHEXOL 300 MG/ML  SOLN  Comparison: None.  Findings: Hepatic cirrhosis is demonstrated, but no liver masses are identified. Portal and splenic veins are patent.  Collateral vessels seen in the upper abdomen are consistent with portal venous hypertension.  No evidence of splenomegaly.  Moderate ascites seen throughout the abdomen and pelvis.  Gallbladder is distended and contains multiple small gallstones, however there is no evidence of gallbladder wall thickening or biliary dilatation. The pancreas, adrenal glands, and kidneys are normal in appearance.  Uterus and adnexa are unremarkable.  No soft tissue masses or lymphadenopathy identified.  Moderate right-sided colonic wall thickening is seen, which does not appear to involve  the remainder of the colon or small bowel. Although this could be related to hypoalbuminemia, right-sided colitis cannot be excluded.   No evidence of abscess or bowel obstruction.  IMPRESSION:  1.  Hepatic cirrhosis and portal venous hypertension.  No evidence of portal vein thrombosis  or hepatic neoplasm. 2.  Moderate ascites.  No evidence of splenomegaly. 3.  Distended gallbladder containing small gallstones.  No evidence of gallbladder wall thickening or biliary dilatation. 4.  Right colonic wall thickening.  Differential diagnosis includes hypoalbuminemia and right-sided colitis.  Suggest clinical correlation for signs and symptoms of colitis.  Original Report Authenticated By: Danae Orleans, M.D.   Dg Chest Portable 1 View  08/25/2011  *RADIOLOGY REPORT*  Clinical Data: Shortness of breath  PORTABLE CHEST - 1 VIEW  Comparison: 01/22/2011  Findings: Apparent enlargement of the cardiac silhouette is favored to be artifactual secondary to decreased lung volumes and projection.  Unchanged mediastinal contours.  Bibasilar opacities favored to represent atelectasis.  No definite pleural effusion or pneumothorax.  Grossly unchanged bones.  IMPRESSION: Apparent enlargement cardiac silhouette is favored to be  secondary to decreased lung volumes and projection. No definite acute cardiopulmonary disease.  Further evaluation with a PA and lateral chest radiograph may be obtained as clinically indicated.  Original Report Authenticated By: Waynard Reeds, M.D.    Medications: Scheduled Meds:   . escitalopram  10 mg Oral Daily  . furosemide  20 mg Intravenous Q12H  . lactulose  30 g Oral TID  . lactulose  60 g Oral Once  . phytonadione  10 mg Subcutaneous Daily  . potassium chloride  10 mEq Intravenous Q1 Hr x 3  . spironolactone  50 mg Oral BID  . tetrahydrozoline  1 drop Both Eyes BID  . DISCONTD: furosemide  40 mg Intravenous Q12H   Continuous Infusions:   . DISCONTD: sodium chloride 75 mL/hr  (08/27/11 0536)   PRN Meds:.calcium carbonate, diphenoxylate-atropine, DISCONTD: promethazine  Assessment/Plan: Principal Problem:  *Bradycardia Hypotension/ likely due to ARF, dehydration from diuretics and decreased clearance of Metoprolol and Verapamil.  Cont to hold.   Active Problems:  Cirrhosis Apparently had a h/o of drinking a bottler of wine a day for yrs. Also could have autoimmune liver disease.  Have requested biopsy and started s/c Vit K for 3 days for her coagulopathy which may not correct.   Ascites Resume Lasix and Aldactone for ascites while watching Creatinine carefully.   Pedal edema Likely related to cirrhosis- will get ECHO to confirm   Acute renal failure Resolved with hydration.   Delirium Checked ammonia- elevated but I am not aware of her baseline UA is also positive Will start with treating the elevated ammonia for now. Will order a urine cultures.    Leukocytosis F/u on urine culture   Coagulopathy Due to cirrhosis- see above  Hypokalemia Replace and follow while on diuretics.   Thickening of Colon seen on CT Was going to have endoscopy by Dr Darrick Penna.    [facility] [phone] 08/27/2011, 3:44 PM  LOS: 2 days

## 2011-08-27 NOTE — Telephone Encounter (Addendum)
Called pt's home ph#. lvm to call [phone]. PT ADMITTED TO [facility] FOR HYPOTN AND ARF, VOMITING. PAGED IN HOUSE MD. WILL TRY TO GET TJ LIVER BX WHILE PT IN [place]. LIVER ENZYMES SLIGHTLY IMPROVED. SPOKE WITH DR. Butler Denmark. ASKED IF PT COULD GET HER TJ LIVER BIOPSY PRIOR TO D/C.

## 2011-08-27 NOTE — Progress Notes (Signed)
Pt had another bout of nausea this morning with some vomiting. Safety sitter was in the room with the patient and stated it was black/green. Pt is still very confused and is alert to self and time but not place.   Louanne Calvillo, Charlaine Dalton

## 2011-08-28 ENCOUNTER — Inpatient Hospital Stay (HOSPITAL_COMMUNITY): Payer: BC Managed Care – PPO

## 2011-08-28 DIAGNOSIS — I495 Sick sinus syndrome: Secondary | ICD-10-CM

## 2011-08-28 DIAGNOSIS — N179 Acute kidney failure, unspecified: Secondary | ICD-10-CM

## 2011-08-28 DIAGNOSIS — F05 Delirium due to known physiological condition: Secondary | ICD-10-CM

## 2011-08-28 DIAGNOSIS — K7460 Unspecified cirrhosis of liver: Secondary | ICD-10-CM

## 2011-08-28 LAB — CBC
HCT: 35.2 % — ABNORMAL LOW (ref 36.0–46.0)
Hemoglobin: 13 g/dL (ref 12.0–15.0)
MCH: 37 pg — ABNORMAL HIGH (ref 26.0–34.0)
MCHC: 36.9 g/dL — ABNORMAL HIGH (ref 30.0–36.0)
MCHC: 36 g/dL (ref 30.0–36.0)
MCV: 102.3 fL — ABNORMAL HIGH (ref 78.0–100.0)
MCV: 102.8 fL — ABNORMAL HIGH (ref 78.0–100.0)
Platelets: 148 10*3/uL — ABNORMAL LOW (ref 150–400)
RBC: 3.22 MIL/uL — ABNORMAL LOW (ref 3.87–5.11)
RDW: 15.7 % — ABNORMAL HIGH (ref 11.5–15.5)
WBC: 18.5 10*3/uL — ABNORMAL HIGH (ref 4.0–10.5)

## 2011-08-28 LAB — BASIC METABOLIC PANEL
BUN: 15 mg/dL (ref 6–23)
CO2: 23 mEq/L (ref 19–32)
Chloride: 98 mEq/L (ref 96–112)
Creatinine, Ser: 0.83 mg/dL (ref 0.50–1.10)
GFR calc Af Amer: >90 mL/min (ref >90)
Potassium: 3.4 mEq/L — ABNORMAL LOW (ref 3.5–5.1)

## 2011-08-28 LAB — PROTIME-INR
INR: 2.5 — ABNORMAL HIGH (ref 0.00–1.49)
Prothrombin Time: 27.4 seconds — ABNORMAL HIGH (ref 11.6–15.2)

## 2011-08-28 MED ORDER — DEXTROSE 5 % IV SOLN
10.0000 mg | Freq: Every day | INTRAVENOUS | Status: AC
  Administered 2011-08-29 – 2011-08-30 (×3): 10 mg via INTRAVENOUS
  Filled 2011-08-28 (×5): qty 1

## 2011-08-28 MED ORDER — FENTANYL CITRATE 0.05 MG/ML IJ SOLN
INTRAMUSCULAR | Status: AC | PRN
  Administered 2011-08-28 (×2): 50 ug via INTRAVENOUS

## 2011-08-28 MED ORDER — SODIUM CHLORIDE 0.9 % IV SOLN
INTRAVENOUS | Status: AC | PRN
  Administered 2011-08-28: 50 mL/h via INTRAVENOUS

## 2011-08-28 MED ORDER — IOHEXOL 300 MG/ML  SOLN
50.0000 mL | Freq: Once | INTRAMUSCULAR | Status: AC | PRN
  Administered 2011-08-28: 15 mL via INTRAVENOUS

## 2011-08-28 MED ORDER — FUROSEMIDE 20 MG PO TABS
20.0000 mg | ORAL_TABLET | Freq: Two times a day (BID) | ORAL | Status: DC
  Administered 2011-08-28 – 2011-08-29 (×3): 20 mg via ORAL
  Filled 2011-08-28 (×4): qty 1

## 2011-08-28 MED ORDER — MIDAZOLAM HCL 5 MG/5ML IJ SOLN
INTRAMUSCULAR | Status: AC | PRN
  Administered 2011-08-28: 0.5 mg via INTRAVENOUS
  Administered 2011-08-28 (×3): 1 mg via INTRAVENOUS

## 2011-08-28 MED ORDER — PREDNISONE 20 MG PO TABS
40.0000 mg | ORAL_TABLET | Freq: Every day | ORAL | Status: DC
  Administered 2011-08-29 – 2011-09-05 (×8): 40 mg via ORAL
  Filled 2011-08-28 (×10): qty 2

## 2011-08-28 NOTE — Progress Notes (Signed)
PT Cancellation Note  Treatment cancelled today due to Pt has orders for strict bedrest in chart.  No updated activity orders in chart despite request from PT and Nursing. Acute PT will be holding any further PT intervention until pt activity orders have been updated to allow pt to participate in PT.   Marland Kitchen  Jaquelynn Wanamaker 08/28/2011, 1:55 PM Jayesh Marbach L. Roseanna Koplin DPT [phone].

## 2011-08-28 NOTE — Procedures (Signed)
Successful TJLBX No comp Stable Path pending

## 2011-08-28 NOTE — Progress Notes (Signed)
[facility]  TEAM 1 - Stepdown/ICU TEAM  PCP:  [name]., MD, MD  Subjective: 55yoF with h/o Sjogren's disease, psoriatic arthritis, alcohol abuse, and diagnosed this month with cirrhosis who comes in with presyncope and found to have bradycardia, hypotension, and new ARF.   Pt is seen post-procedure.  She remains somewhat lethargic.  Her husband is seen at the bedside and updated on our tx plan.  Pt is not able to provide a reliable hx at this time but does not appear to be in any acute distress.  Objective:  Intake/Output Summary (Last 24 hours) at 08/28/11 1348 Last data filed at 08/28/11 1200  Gross per 24 hour  Intake    340 ml  Output    550 ml  Net   -210 ml   Blood pressure 147/81, pulse 97, temperature 98.5 F (36.9 C), temperature source Oral, resp. rate 18, height 5\' 8"  (1.727 m), weight 127 kg (279 lb 15.8 oz), SpO2 96.00%.  CBG (last 3)   Basename 08/26/11 0837  GLUCAP 90   Physical Exam: General: No acute respiratory distress Lungs: Clear to auscultation bilaterally without wheezes or crackles Cardiovascular: Regular rate and rhythm without murmur gallop or rub normal S1 and S2 Abdomen: Nontender, mildly protuberent, soft, bowel sounds positive, no rebound, no ascites, no appreciable mass Extremities: No significant cyanosis, clubbing, or edema bilateral lower extremities  Lab Results:  Basename 08/28/11 0603 08/27/11 0603 08/26/11 0528 08/25/11 2016  NA 132* 134* 132* --  K 3.4* 3.3* 3.3* --  CL 98 96 97 --  CO2 23 24 22  --  GLUCOSE 122* 125* 96 --  BUN 15 13 11  --  CREATININE 0.83 0.72 1.07 --  CALCIUM 8.7 9.1 8.7 --  MG -- 2.0 -- 1.9  PHOS -- -- -- --    [facility] 08/26/11 0528 08/25/11 2016  AST 112* 141*  ALT 57* 64*  ALKPHOS 88 85  BILITOT 5.5* 6.0*  PROT 6.4 7.0  ALBUMIN 2.2* 2.4*    Basename 08/28/11 0603 08/26/11 0528 08/25/11 2044 08/25/11 2016  WBC 18.5* 11.4* -- 13.2*  NEUTROABS -- -- -- 8.3*  HGB 13.0 12.3  13.9 --  HCT 35.2* 33.7* 41.0 --  MCV 102.3* 104.0* -- 105.9*  PLT 175 154 -- 176    Basename 08/26/11 0528 08/25/11 2017  CKTOTAL 61 --  CKMB 1.6 --  CKMBINDEX -- --  TROPONINI <0.30 <0.30   Micro Results: Recent Results (from the past 240 hour(s))  MRSA PCR SCREENING     Status: Normal   Collection Time   08/26/11  2:08 AM      Component Value Range Status Comment   MRSA by PCR NEGATIVE  NEGATIVE  Final    Studies/Results: All recent x-ray/radiology reports have been reviewed in detail.   Medications: I have reviewed the patient's complete medication list.  Assessment/Plan:  Cirrhosis, alcoholic v/s AMA NEG PBC Pt underwent TJLBX today at the suggestion of her outpt GI/hepatology MD - will await path results - discussion was to consider prolonged course of steroids to empirically tx for possible AMA neg PBX - will initiate and follow  coagulopahty Dosing w/ vitamin K without benefit thus far - underwnt liver bx today - will change to IV vitamin K - may have to consider FFP if Hgb drops - keep in SDU for close monitoring as pt high risk for intra-abdom bleed  Bradycardia w/ presyncope Resolved - felt to be due to retention of meds due to  ARF + diminished metabolism due to liver disease  Hypotension w/ presyncope due to diurectic induced DH - resolved w/ volume expansion  Ascites/edema Due to cirrhosis - stable at present   Acute renal failure Resolved w/ volume resuscitation  delirium Currently confused due to sedative - f/u in AM  Intractable vomiting Appears to have improved for now  leukocytosis Progressive - will recheck in AM - if continues to climb may have to consider empiric tx for SBP  macrocytosis Check B12 and folic acid - likely due to liver disease and EtOH  UTI UA + - begin empiric tx and f/u culture  Colon thickening via CT abdom Will need colonoscopy when stable - possibly as outpt - would ideally like coagulopathy resolved first to allow  bxs  Sjogren's  Should be tx w/ steroid   Lonia Blood, MD [facility] Office  [phone] Pager [phone]  On-Call/Text Page:      [email or web address]      password [facility]

## 2011-08-29 DIAGNOSIS — F05 Delirium due to known physiological condition: Secondary | ICD-10-CM

## 2011-08-29 DIAGNOSIS — N179 Acute kidney failure, unspecified: Secondary | ICD-10-CM

## 2011-08-29 DIAGNOSIS — K7460 Unspecified cirrhosis of liver: Secondary | ICD-10-CM

## 2011-08-29 DIAGNOSIS — I495 Sick sinus syndrome: Secondary | ICD-10-CM

## 2011-08-29 LAB — CBC
Platelets: 144 10*3/uL — ABNORMAL LOW (ref 150–400)
RBC: 3.39 MIL/uL — ABNORMAL LOW (ref 3.87–5.11)
RDW: 16.1 % — ABNORMAL HIGH (ref 11.5–15.5)
WBC: 13.7 10*3/uL — ABNORMAL HIGH (ref 4.0–10.5)

## 2011-08-29 LAB — COMPREHENSIVE METABOLIC PANEL
ALT: 57 U/L — ABNORMAL HIGH (ref 0–35)
AST: 113 U/L — ABNORMAL HIGH (ref 0–37)
Albumin: 2.4 g/dL — ABNORMAL LOW (ref 3.5–5.2)
Alkaline Phosphatase: 87 U/L (ref 39–117)
CO2: 23 mEq/L (ref 19–32)
Chloride: 100 mEq/L (ref 96–112)
Creatinine, Ser: 0.71 mg/dL (ref 0.50–1.10)
GFR calc non Af Amer: >90 mL/min (ref >90)
Potassium: 3.2 mEq/L — ABNORMAL LOW (ref 3.5–5.1)
Sodium: 136 mEq/L (ref 135–145)
Total Bilirubin: 4.9 mg/dL — ABNORMAL HIGH (ref 0.3–1.2)

## 2011-08-29 LAB — IRON AND TIBC
Iron: 79 ug/dL (ref 42–135)
TIBC: 155 ug/dL — ABNORMAL LOW (ref 250–470)

## 2011-08-29 LAB — RETICULOCYTES: Retic Count, Absolute: 155.9 10*3/uL (ref 19.0–186.0)

## 2011-08-29 LAB — FERRITIN: Ferritin: 771 ng/mL — ABNORMAL HIGH (ref 10–291)

## 2011-08-29 LAB — FOLATE: Folate: 2.8 ng/mL — ABNORMAL LOW

## 2011-08-29 LAB — PROTIME-INR
INR: 2.32 — ABNORMAL HIGH (ref 0.00–1.49)
Prothrombin Time: 25.9 seconds — ABNORMAL HIGH (ref 11.6–15.2)

## 2011-08-29 LAB — APTT: aPTT: 46 seconds — ABNORMAL HIGH (ref 24–37)

## 2011-08-29 MED ORDER — LEVOFLOXACIN 500 MG PO TABS
500.0000 mg | ORAL_TABLET | Freq: Every day | ORAL | Status: DC
  Administered 2011-08-29 – 2011-09-01 (×4): 500 mg via ORAL
  Filled 2011-08-29 (×4): qty 1

## 2011-08-29 MED ORDER — FOLIC ACID 5 MG/ML IJ SOLN
1.0000 mg | Freq: Once | INTRAMUSCULAR | Status: AC
  Administered 2011-08-29: 1 mg via INTRAVENOUS
  Filled 2011-08-29 (×2): qty 0.2

## 2011-08-29 MED ORDER — FUROSEMIDE 40 MG PO TABS
40.0000 mg | ORAL_TABLET | Freq: Two times a day (BID) | ORAL | Status: DC
  Administered 2011-08-30 (×2): 40 mg via ORAL
  Filled 2011-08-29 (×3): qty 1

## 2011-08-29 MED ORDER — FOLIC ACID 1 MG PO TABS
1.0000 mg | ORAL_TABLET | Freq: Every day | ORAL | Status: DC
  Administered 2011-08-30 – 2011-09-05 (×7): 1 mg via ORAL
  Filled 2011-08-29 (×7): qty 1

## 2011-08-29 NOTE — Progress Notes (Signed)
[facility] [place] TEAM 1 - Stepdown/ICU TEAM  PCP:  [name]., MD, MD  Subjective: 55yoF with h/o Sjogren's disease, psoriatic arthritis, alcohol abuse, and diagnosed this month with cirrhosis who comes in with presyncope and found to have bradycardia, hypotension, and new ARF.   Pt has been sleeping all day per RN.  She awakens easily for me, and is quite conversant.  She has no specific complaints.  We discussed our current tx plan in length.    Objective:  Intake/Output Summary (Last 24 hours) at 08/29/11 1321 Last data filed at 08/29/11 1100  Gross per 24 hour  Intake    480 ml  Output    600 ml  Net   -120 ml   Blood pressure 143/69, pulse 79, temperature 98.5 F (36.9 C), temperature source Oral, resp. rate 21, height 5\' 8"  (1.727 m), weight 126.8 kg (279 lb 8.7 oz), SpO2 93.00%.  CBG (last 3)   Basename 08/29/11 0723  GLUCAP 94   Physical Exam: General: No acute respiratory distress Lungs: Clear to auscultation bilaterally without wheezes or crackles Cardiovascular: Regular rate and rhythm without murmur gallop or rub normal S1 and S2 Abdomen: Nontender, protuberent, soft, bowel sounds positive, no rebound, no ascites, no appreciable mass Extremities: No significant cyanosis or clubbing;  2+ edema bilateral lower extremities  Lab Results:  Basename 08/29/11 0500 08/28/11 0603 08/27/11 0603  NA 136 132* 134*  K 3.2* 3.4* 3.3*  CL 100 98 96  CO2 23 23 24   GLUCOSE 85 122* 125*  BUN 15 15 13   CREATININE 0.71 0.83 0.72  CALCIUM 8.5 8.7 9.1  MG -- -- 2.0  PHOS -- -- --    Basename 08/29/11 0500  AST 113*  ALT 57*  ALKPHOS 87  BILITOT 4.9*  PROT 6.9  ALBUMIN 2.4*    Basename 08/29/11 0500 08/28/11 2056 08/28/11 0603  WBC 13.7* 16.1* 18.5*  NEUTROABS -- -- --  HGB 12.3 11.9* 13.0  HCT 34.9* 33.1* 35.2*  MCV 102.9* 102.8* 102.3*  PLT 144* 148* 175   Micro Results: Recent Results (from the past 240 hour(s))  MRSA PCR SCREENING      Status: Normal   Collection Time   08/26/11  2:08 AM      Component Value Range Status Comment   MRSA by PCR NEGATIVE  NEGATIVE  Final    Studies/Results: All recent x-ray/radiology reports have been reviewed in detail.   Medications: I have reviewed the patient's complete medication list.  Assessment/Plan:  Cirrhosis, alcoholic v/s AMA NEG PBC Pt underwent TJLBX 5/31 at the suggestion of her outpt GI/hepatology MD - will await path results - discussion was to consider prolonged course of steroids to empirically tx for possible AMA neg PBX - have initiated  coagulopahty Dosing w/ vitamin K without benefit thus far - underwnt liver bx today - changed to IV vitamin K - may have to consider FFP if Hgb drops - keep in SDU for close monitoring as pt high risk for intra-abdom bleed - Hgb stable for now  Bradycardia w/ presyncope Resolved - felt to be due to retention of meds due to ARF + diminished metabolism due to liver disease  Folate deficiency w/ macrocytosis Replacement begun  Hypotension w/ presyncope due to diurectic induced DH - resolved w/ volume expansion  Ascites/edema Due to cirrhosis - ascites volume is expanding, and edema is worsening - attempt to adjust diuretic dosing  Acute renal failure Resolved w/ volume resuscitation - watch w/  ongoing diuresis  delirium Much improved today - appears to be at baseline mental status  Intractable vomiting Appears to have improved for now  leukocytosis Now improving   UTI UA + - on empiric tx -appears cx (though ordered) was not sent/completed  Colon thickening via CT abdom Will need colonoscopy when stable - possibly as outpt - would ideally like coagulopathy resolved first to allow bxs  Sjogren's  Should be tx w/ steroid   Lonia Blood, MD [facility] Office  [phone] Pager [phone]  On-Call/Text Page:      [email or web address]      password [facility]

## 2011-08-30 DIAGNOSIS — I495 Sick sinus syndrome: Secondary | ICD-10-CM

## 2011-08-30 DIAGNOSIS — K7460 Unspecified cirrhosis of liver: Secondary | ICD-10-CM

## 2011-08-30 DIAGNOSIS — N179 Acute kidney failure, unspecified: Secondary | ICD-10-CM

## 2011-08-30 DIAGNOSIS — F05 Delirium due to known physiological condition: Secondary | ICD-10-CM

## 2011-08-30 LAB — COMPREHENSIVE METABOLIC PANEL
ALT: 55 U/L — ABNORMAL HIGH (ref 0–35)
Alkaline Phosphatase: 77 U/L (ref 39–117)
BUN: 13 mg/dL (ref 6–23)
CO2: 24 mEq/L (ref 19–32)
Chloride: 104 mEq/L (ref 96–112)
GFR calc Af Amer: >90 mL/min (ref >90)
Glucose, Bld: 116 mg/dL — ABNORMAL HIGH (ref 70–99)
Potassium: 3.2 mEq/L — ABNORMAL LOW (ref 3.5–5.1)
Sodium: 137 mEq/L (ref 135–145)
Total Bilirubin: 4 mg/dL — ABNORMAL HIGH (ref 0.3–1.2)

## 2011-08-30 LAB — CBC
HCT: 33.3 % — ABNORMAL LOW (ref 36.0–46.0)
MCHC: 35.4 g/dL (ref 30.0–36.0)
Platelets: 133 10*3/uL — ABNORMAL LOW (ref 150–400)
RDW: 15.3 % (ref 11.5–15.5)
WBC: 13.4 10*3/uL — ABNORMAL HIGH (ref 4.0–10.5)

## 2011-08-30 MED ORDER — FUROSEMIDE 80 MG PO TABS
80.0000 mg | ORAL_TABLET | Freq: Two times a day (BID) | ORAL | Status: DC
  Administered 2011-08-31: 80 mg via ORAL
  Filled 2011-08-30 (×3): qty 1

## 2011-08-30 NOTE — Progress Notes (Signed)
[facility] [place] TEAM 1 - Stepdown/ICU TEAM  PCP:  [name]., MD, MD  Subjective: 55yoF with h/o Sjogren's disease, psoriatic arthritis, alcohol abuse, and diagnosed this month with cirrhosis who comes in with presyncope and found to have bradycardia, hypotension, and new ARF.   The pt is awake and conversant, and quite jolly today.  She denies any complaints whatsoever.  She denies cp, sob, n/v, abdom pain, or f/c.  She states that her appetite is good.  She does report increased swelling in her legs and abdomen.  Objective:  Intake/Output Summary (Last 24 hours) at 08/30/11 1016 Last data filed at 08/30/11 0700  Gross per 24 hour  Intake    460 ml  Output    400 ml  Net     60 ml   Blood pressure 130/70, pulse 80, temperature 97.8 F (36.6 C), temperature source Oral, resp. rate 16, height 5\' 8"  (1.727 m), weight 125.1 kg (275 lb 12.7 oz), SpO2 95.00%.  CBG (last 3)   Basename 08/29/11 0723  GLUCAP 94   Physical Exam: General: No acute respiratory distress Lungs: Clear to auscultation bilaterally without wheezes or crackles Cardiovascular: Regular rate and rhythm without murmur gallop or rub normal S1 and S2 Abdomen: Nontender, protuberent (appreciably more so than yesterday), soft, bowel sounds positive, no rebound, no ascites, no appreciable mass Extremities: No significant cyanosis or clubbing;  3+ edema bilateral lower extremities  Lab Results:  Basename 08/30/11 0717 08/29/11 0500 08/28/11 0603  NA 137 136 132*  K 3.2* 3.2* 3.4*  CL 104 100 98  CO2 24 23 23   GLUCOSE 116* 85 122*  BUN 13 15 15   CREATININE 0.61 0.71 0.83  CALCIUM 8.3* 8.5 8.7  MG -- -- --  PHOS -- -- --    Basename 08/30/11 0717 08/29/11 0500  AST 95* 113*  ALT 55* 57*  ALKPHOS 77 87  BILITOT 4.0* 4.9*  PROT 6.3 6.9  ALBUMIN 2.1* 2.4*    Basename 08/30/11 0717 08/29/11 0500 08/28/11 2056  WBC 13.4* 13.7* 16.1*  NEUTROABS -- -- --  HGB 11.8* 12.3 11.9*  HCT 33.3*  34.9* 33.1*  MCV 102.1* 102.9* 102.8*  PLT 133* 144* 148*   Micro Results: Recent Results (from the past 240 hour(s))  MRSA PCR SCREENING     Status: Normal   Collection Time   08/26/11  2:08 AM      Component Value Range Status Comment   MRSA by PCR NEGATIVE  NEGATIVE  Final    Studies/Results: All recent x-ray/radiology reports have been reviewed in detail.   Medications: I have reviewed the patient's complete medication list.  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  Pt underwent TJLBX 5/31 at the suggestion of her outpt GI/hepatology MD - awaiting path results - discussion was to consider prolonged course of steroids to empirically tx for possible AMA neg PBX - have initiated  coagulopahty Dosing w/ vitamin K without benefit thus far - underwnt liver bx today - changed to IV vitamin K - may have to consider FFP if Hgb drops - keep in SDU for close monitoring as pt high risk for intra-abdom bleed - Hgb remains stable for now - if not able to d/c tomorrow, should be safe to transfer out of SDU at that time  Bradycardia w/ presyncope Resolved - felt to be due to retention of meds due to ARF + diminished metabolism due to liver disease  Folate deficiency w/ macrocytosis  Replacement continues  Hypotension w/ presyncope due to diurectic induced DH - resolved w/ volume expansion  Ascites/edema Due to cirrhosis - ascites volume is expanding, and edema is worsening - attempt to adjust diuretic dosing - will be difficult, as evidenced by [facility] encountered w/ attempts to diurese her in the outpt setting - increase dosing and follow Is/Os (pt is only + 1350 since admit if records are to be believed)  Acute renal failure Resolved w/ volume resuscitation - watch w/ ongoing diuresis  delirium appears to be at baseline mental status at this time  Intractable vomiting Resolved   UTI UA + - on empiric tx -appears cx (though ordered) was not  sent/completed - will complete 7 days of empiric tx   Colon thickening via CT abdom Will need colonoscopy when stable - possibly as outpt - would ideally like coagulopathy resolved first to allow bxs  Sjogren's  Should be tx w/ steroid   Dispo Adjust diuretic - consider d/c v/s transfer in next 24hrs - will discuss plan further with her outpt GI MD on Monday  Lonia Blood, MD [facility] Office  [phone] Pager [phone]  On-Call/Text Page:      [email or web address]      password [facility]

## 2011-08-30 NOTE — Progress Notes (Signed)
REVIEWED.  

## 2011-08-31 ENCOUNTER — Telehealth (HOSPITAL_COMMUNITY): Payer: Self-pay | Admitting: *Deleted

## 2011-08-31 ENCOUNTER — Telehealth: Payer: Self-pay

## 2011-08-31 DIAGNOSIS — F05 Delirium due to known physiological condition: Secondary | ICD-10-CM

## 2011-08-31 DIAGNOSIS — K7460 Unspecified cirrhosis of liver: Secondary | ICD-10-CM

## 2011-08-31 DIAGNOSIS — I495 Sick sinus syndrome: Secondary | ICD-10-CM

## 2011-08-31 DIAGNOSIS — N179 Acute kidney failure, unspecified: Secondary | ICD-10-CM

## 2011-08-31 LAB — COMPREHENSIVE METABOLIC PANEL
AST: 95 U/L — ABNORMAL HIGH (ref 0–37)
Albumin: 2.2 g/dL — ABNORMAL LOW (ref 3.5–5.2)
Alkaline Phosphatase: 75 U/L (ref 39–117)
BUN: 14 mg/dL (ref 6–23)
Chloride: 100 mEq/L (ref 96–112)
Creatinine, Ser: 0.71 mg/dL (ref 0.50–1.10)
Potassium: 3.1 mEq/L — ABNORMAL LOW (ref 3.5–5.1)
Total Bilirubin: 3.7 mg/dL — ABNORMAL HIGH (ref 0.3–1.2)
Total Protein: 6.4 g/dL (ref 6.0–8.3)

## 2011-08-31 LAB — CBC
Hemoglobin: 12 g/dL (ref 12.0–15.0)
Platelets: 151 10*3/uL (ref 150–400)
RBC: 3.31 MIL/uL — ABNORMAL LOW (ref 3.87–5.11)
WBC: 14.8 10*3/uL — ABNORMAL HIGH (ref 4.0–10.5)

## 2011-08-31 LAB — AMMONIA: Ammonia: 32 umol/L (ref 11–60)

## 2011-08-31 LAB — PROTIME-INR
INR: 2.52 — ABNORMAL HIGH (ref 0.00–1.49)
Prothrombin Time: 27.6 seconds — ABNORMAL HIGH (ref 11.6–15.2)

## 2011-08-31 MED ORDER — FAMOTIDINE 20 MG PO TABS
20.0000 mg | ORAL_TABLET | Freq: Two times a day (BID) | ORAL | Status: DC
  Administered 2011-08-31 – 2011-09-05 (×10): 20 mg via ORAL
  Filled 2011-08-31 (×12): qty 1

## 2011-08-31 MED ORDER — FUROSEMIDE 80 MG PO TABS
80.0000 mg | ORAL_TABLET | Freq: Three times a day (TID) | ORAL | Status: DC
  Administered 2011-08-31 – 2011-09-02 (×6): 80 mg via ORAL
  Filled 2011-08-31 (×8): qty 1

## 2011-08-31 MED ORDER — POTASSIUM CHLORIDE CRYS ER 20 MEQ PO TBCR
40.0000 meq | EXTENDED_RELEASE_TABLET | Freq: Three times a day (TID) | ORAL | Status: DC
  Administered 2011-08-31 – 2011-09-02 (×6): 40 meq via ORAL
  Filled 2011-08-31 (×7): qty 2
  Filled 2011-08-31: qty 1

## 2011-08-31 MED ORDER — DIPHENOXYLATE-ATROPINE 2.5-0.025 MG PO TABS
1.0000 | ORAL_TABLET | Freq: Four times a day (QID) | ORAL | Status: DC
  Administered 2011-08-31 – 2011-09-03 (×9): 1 via ORAL
  Filled 2011-08-31 (×13): qty 1

## 2011-08-31 MED ORDER — LACTULOSE 10 GM/15ML PO SOLN
30.0000 g | Freq: Two times a day (BID) | ORAL | Status: DC
  Administered 2011-08-31 – 2011-09-03 (×5): 30 g via ORAL
  Filled 2011-08-31 (×7): qty 45

## 2011-08-31 NOTE — Plan of Care (Signed)
PT S/P TJ LIVER BX. PATH PENDING. NOW ON PREDNISONE 40 QD FOR ETOH HEPATITIS. D/ C ANTICIPATED IN 48 HOURS. INR FAILED TO RESPOND TO VIT K. NOT A CANDIDATE FOR THERAPEUTIC ENDOSCOPY DUE TO INR > 2. ON LASIX 80 MG IV TID, ALDACTONE 50 MG BID, LACTULOSE BID, LEVAQUIN AND PREDNISONE. OPV IN 2 WEEKS  E 30 VISIT WITH DR. Elexis Pollak, DX: CIRRHOSIS, MELD 22, CHILD PUGH C.

## 2011-08-31 NOTE — Telephone Encounter (Signed)
V/M from [facility], Dr. Sharon Seller, at [facility]. Said pt is still inpatient at [facility] and he believes Dr. Darrick Penna is aware. He wanted to know if there was anything that Dr. Darrick Penna would like ordered while the pt is inpatient. Said he anticipates she will be there at least another 48 hours while they are adjusting her diurectics and getting her mobilized. He said it is OK just to respond in Epic, he will be checking her chart. ( No phone contact number was left)

## 2011-08-31 NOTE — Telephone Encounter (Signed)
SEE PLAN OF CARE NOTE.

## 2011-08-31 NOTE — Plan of Care (Signed)
Problem: Food- and Nutrition-Related Knowledge Deficit (NB-1.1) Goal: Nutrition education Formal process to instruct or train a patient/client in a skill or to impart knowledge to help patients/clients voluntarily manage or modify food choices and eating behavior to maintain or improve health.  Outcome: Completed/Met Date Met:  08/31/11 RD consulted for diet education, pt now on hepatic diet. RD provided pt with Hepatic Diet check med booklet. RD went over food groups and amount of protein per serving of each. RD went over total number of grams of protein daily. Also encouraged pt to follow a low sodium diet. RD answered all pt questions. Pt verbalized understanding of recommendations. Encouraged pt to notify staff of additional education needs. RD expects fair compliance with diet instruction.  Chart reviewed. Body mass index is 41.53 kg/(m^2). Morbid obesity.  No additional nutrition interventions at this time. Please re-consult if needed.

## 2011-08-31 NOTE — Progress Notes (Signed)
[facility] [place] TEAM 1 - Stepdown/ICU TEAM  PCP:  [name]., MD, MD  Subjective: 55yoF with h/o Sjogren's disease, psoriatic arthritis, alcohol abuse, and diagnosed this month with cirrhosis who comes in with presyncope and found to have bradycardia, hypotension, and new ARF.   The pt is back to her baseline MS as per the husband's report.  She c/o voluminous watery stools (on lactulose), ongoing periph edema, and epigastric discomfort c/w her hx of GERD.  She denies cp, f/c, n/v, or signif abdom pain.    Objective:  Intake/Output Summary (Last 24 hours) at 08/31/11 1336 Last data filed at 08/31/11 1000  Gross per 24 hour  Intake    120 ml  Output      0 ml  Net    120 ml   Blood pressure 140/74, pulse 86, temperature 97.9 F (36.6 C), temperature source Oral, resp. rate 16, height 5\' 8"  (1.727 m), weight 123.9 kg (273 lb 2.4 oz), SpO2 98.00%.  CBG (last 3)   Basename 08/29/11 0723  GLUCAP 94   Physical Exam: General: No acute respiratory distress Lungs: Clear to auscultation bilaterally without wheezes or crackles Cardiovascular: Regular rate and rhythm without murmur gallop or rub normal S1 and S2 Abdomen: Nontender, protuberent (stable compared to yesterday), soft, bowel sounds positive, no rebound, obvious ascites, no appreciable mass Extremities: No significant cyanosis or clubbing;  3+ edema bilateral lower extremities w/erythema  Lab Results:  Basename 08/31/11 0527 08/30/11 0717 08/29/11 0500  NA 136 137 136  K 3.1* 3.2* 3.2*  CL 100 104 100  CO2 24 24 23   GLUCOSE 120* 116* 85  BUN 14 13 15   CREATININE 0.71 0.61 0.71  CALCIUM 8.7 8.3* 8.5  MG -- -- --  PHOS -- -- --    Basename 08/31/11 0527 08/30/11 0717  AST 95* 95*  ALT 58* 55*  ALKPHOS 75 77  BILITOT 3.7* 4.0*  PROT 6.4 6.3  ALBUMIN 2.2* 2.1*    Basename 08/31/11 0527 08/30/11 0717 08/29/11 0500  WBC 14.8* 13.4* 13.7*  NEUTROABS -- -- --  HGB 12.0 11.8* 12.3  HCT 33.9*  33.3* 34.9*  MCV 102.4* 102.1* 102.9*  PLT 151 133* 144*   Micro Results: Recent Results (from the past 240 hour(s))  MRSA PCR SCREENING     Status: Normal   Collection Time   08/26/11  2:08 AM      Component Value Range Status Comment   MRSA by PCR NEGATIVE  NEGATIVE  Final   URINE CULTURE     Status: Normal (Preliminary result)   Collection Time   08/29/11  8:24 PM      Component Value Range Status Comment   Specimen Description URINE, CLEAN CATCH   Final    Special Requests NONE   Final    Culture  Setup Time [id number]   Final    Colony Count >=100,000 COLONIES/ML   Final    Culture GRAM NEGATIVE RODS   Final    Report Status PENDING   Incomplete    Studies/Results: All recent x-ray/radiology reports have been reviewed in detail.   Medications: I have reviewed the patient's complete medication list.  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  Pt underwent TJLBX 5/31 at the suggestion of her outpt GI/hepatology MD - awaiting path results - discussion was to consider prolonged course of steroids to empirically tx for possible AMA neg PBX (28 days of pred 40mg  QD with slow taper  to follow) - have initiated / patient is tolerating well   coagulopahty Dosed w/ vitamin K without benefit (both sub Q as well as IV) - may have to consider FFP if Hgb drops, but has thus far proven to be stable despite recent liver bx  Bradycardia w/ presyncope Resolved - felt to be due to retention of meds due to ARF + diminished metabolism due to liver disease  Folate deficiency w/ macrocytosis Replacement continues (first via IV, and now oral)  Hypotension w/ presyncope due to diurectic induced DH - resolved w/ volume expansion  Ascites/edema Due to cirrhosis - ascites has been expanding, and edema is worsening - attempt to adjust diuretic dosing - will be difficult, as evidenced by [facility] encountered w/ attempts to diurese her in the outpt setting - increase  dosing again and follow Is/Os (pt is + 1470 since admit if records are to be believed)  Acute renal failure Resolved w/ volume resuscitation - watch w/ ongoing diuresis  delirium appears to be at baseline mental status at this time  Intractable vomiting Resolved   UTI UA + - on empiric tx -appears cx (though ordered) was not sent/completed - will complete 7 days of empiric tx   Colon thickening via CT abdom Will need colonoscopy when stable - possibly as outpt - would ideally like coagulopathy resolved first to allow bxs  Sjogren's  Should be tx w/ steroid   Dispo Adjust diuretic again - mobilize patient - left message for GI MD and will watch epic for progress notes as a means of communication - nutritionist consult - PT/OT - add lomotil and titrate back on lactulose - transfer to medical bed  Lonia Blood, MD [facility] Office  [phone] Pager [phone]  On-Call/Text Page:      [email or web address]      password [facility]

## 2011-08-31 NOTE — Telephone Encounter (Signed)
Pt is scheduled to come back on 7/10 at 10 to see SF. Do I need to cancel this OV? Also, next available will be on 6/26 and can I use 2 E15 spots? Please advise and I will let patient know when to come back.

## 2011-09-01 ENCOUNTER — Encounter: Payer: Self-pay | Admitting: Gastroenterology

## 2011-09-01 DIAGNOSIS — F05 Delirium due to known physiological condition: Secondary | ICD-10-CM

## 2011-09-01 DIAGNOSIS — K7460 Unspecified cirrhosis of liver: Secondary | ICD-10-CM

## 2011-09-01 DIAGNOSIS — N179 Acute kidney failure, unspecified: Secondary | ICD-10-CM

## 2011-09-01 DIAGNOSIS — I495 Sick sinus syndrome: Secondary | ICD-10-CM

## 2011-09-01 LAB — BASIC METABOLIC PANEL
CO2: 25 mEq/L (ref 19–32)
Chloride: 101 mEq/L (ref 96–112)
GFR calc Af Amer: >90 mL/min (ref >90)
Potassium: 3.8 mEq/L (ref 3.5–5.1)
Sodium: 137 mEq/L (ref 135–145)

## 2011-09-01 LAB — URINE CULTURE

## 2011-09-01 MED ORDER — LEVOFLOXACIN 250 MG PO TABS
250.0000 mg | ORAL_TABLET | Freq: Every day | ORAL | Status: DC
  Administered 2011-09-02 – 2011-09-03 (×2): 250 mg via ORAL
  Filled 2011-09-01 (×2): qty 1

## 2011-09-01 MED ORDER — ENSURE COMPLETE PO LIQD
237.0000 mL | Freq: Two times a day (BID) | ORAL | Status: DC
  Administered 2011-09-01 – 2011-09-02 (×3): 237 mL via ORAL

## 2011-09-01 NOTE — Telephone Encounter (Signed)
Pt has appt with Dr. Darrick Penna on 09/23/2011.

## 2011-09-01 NOTE — Progress Notes (Signed)
   CARE MANAGEMENT NOTE 09/01/2011  Patient:  Deanna Thompson, Deanna Thompson   Account Number:  [id number]  Date Initiated:  08/26/2011  Documentation initiated by:  Junius Creamer  Subjective/Objective Assessment:   adm w bradycardia     Action/Plan:   lives w spouse, pcp dr Lyman Bishop fusco   Anticipated DC Date:  09/03/2011   Anticipated DC Plan:  HOME/SELF CARE      DC Planning Services  CM consult      Choice offered to / List presented to:             Status of service:   Medicare Important Message given?   (If response is "NO", the following Medicare IM given date fields will be blank) Date Medicare IM given:   Date Additional Medicare IM given:    Discharge Disposition:    Per UR Regulation:  Reviewed for med. necessity/level of care/duration of stay  If discussed at Long Length of Stay Meetings, dates discussed:    Comments:  09/01/2011  1500 [name] RN, [place] [phone] Met with patient and spouse to discuss discharge planning and home health referral. She has used [facility] for home care in the past with her parents and selects them for RN and PT visits. Asked about the frequency of visits. Told her the nurse will assess her medical needs the first visit and determine that, the same with the PT. She requests a copy of the costs and what insurance will cover for the visits. CM will ask [facility]. For DME: RW, 3:1 and tub transfer bench from [place]. CM will call them and request copy of costs and iinsurance coverage.  Home address:  [address]  [place], [place]  [address] Phone [phone] Cell [phone]  [facility]/ Hilda Lias:  [phone]  left vm with referral for home RN, PT and copy of coverage for the patient. Plan dc tomorrow [employer] [phone] spoke with Tammy with referral for DME: RW, 3:1 commode, tub transfer  bench FAX:  [phone] copy of face sheet and will send order when entered.  6/3 debbie dowell rn,bsn adj lasix, watching renal.  5/29 debbie dowell rn,bsn  [phone]

## 2011-09-01 NOTE — Progress Notes (Signed)
Quick Note:  Per Dr. Darrick Penna, pt has OV with her on 09/23/2011.  ______

## 2011-09-01 NOTE — Telephone Encounter (Signed)
Error

## 2011-09-01 NOTE — Progress Notes (Signed)
Quick Note:  Sent Dr. Darrick Penna a staff message to advise! ______

## 2011-09-01 NOTE — Telephone Encounter (Signed)
Pt's husband is aware of OV on 6/26 at 0830 with SF and to also keep 7/10 OV.  Husband stated that patient is a patient at [facility] hospital and should be discharged tomorrow.

## 2011-09-01 NOTE — Evaluation (Signed)
Occupational Therapy Evaluation Patient Details Name: Deanna Thompson MRN: [id number] DOB: 06-22-1955 Today's Date: 09/01/2011 Time: 1328-1400 OT Time Calculation (min): 32 min  OT Assessment / Plan / Recommendation Clinical Impression  Pt. presents with  presyncope and found to have bradycardia, hypotension, and new ARF with recent diagnosis of cirrhosis. Pt. will benefit from skilled OT to get pt. to supevision at D/C home.    OT Assessment  Patient needs continued OT Services    Follow Up Recommendations  Home health OT;Supervision - Intermittent    Barriers to Discharge None    Equipment Recommendations  3 in 1 bedside comode;Rolling walker with 5" wheels;Tub/shower bench       Frequency  Min 2X/week    Precautions / Restrictions Precautions Precautions: Fall Restrictions Weight Bearing Restrictions: No       ADL  Eating/Feeding: Simulated;Independent Where Assessed - Eating/Feeding: Edge of bed Grooming: Performed;Wash/dry face;Wash/dry hands;Set up;Supervision/safety Where Assessed - Grooming: Unsupported standing Upper Body Bathing: Simulated;Set up Where Assessed - Upper Body Bathing: Unsupported sitting Lower Body Bathing: Simulated;Set up;Supervision/safety Where Assessed - Lower Body Bathing: Unsupported sit to stand Upper Body Dressing: Simulated;Set up Where Assessed - Upper Body Dressing: Unsupported sitting Lower Body Dressing: Simulated;Set up;Supervision/safety Where Assessed - Lower Body Dressing: Unsupported sit to stand Toilet Transfer: Performed;Supervision/safety Toilet Transfer Method: Other (comment) (ambulation) Toilet Transfer Equipment: Regular height toilet;Grab bars Toileting - Clothing Manipulation and Hygiene: Performed;Modified independent Where Assessed - Toileting Clothing Manipulation and Hygiene: Sit on 3-in-1 or toilet Tub/Shower Transfer Method: Not assessed Equipment Used: Gait belt Transfers/Ambulation Related to ADLs: Pt.  min guard ~8' without AD ADL Comments: Pt. educated on energy conservation techniques to complete with ADL tasks and safety techniques to increase pt. safety at D/C home. Pt. provided with demonstration on technique for completing tub transfer with use of tub transfer bench versus use of tub seat to increase pt. functional abilities at home and safety. Discussed needed DME with pt. and case manager of 3-in-1 tub seat and RW at D/C home.    OT Diagnosis: Generalized weakness  OT Problem List: Decreased strength;Decreased activity tolerance;Impaired balance (sitting and/or standing);Decreased knowledge of use of DME or AE;Decreased safety awareness OT Treatment Interventions: Self-care/ADL training;DME and/or AE instruction;Therapeutic activities;Patient/family education;Balance training   OT Goals Acute Rehab OT Goals OT Goal Formulation: With patient Time For Goal Achievement: 09/08/11 Potential to Achieve Goals: Good ADL Goals Pt Will Perform Tub/Shower Transfer: Tub transfer;with DME;Ambulation;Transfer tub bench ADL Goal: Tub/Shower Transfer - Progress: Goal set today Additional ADL Goal #1: Pt. will recall 3 energy conservation techniques to use with ADL tasks ADL Goal: Additional Goal #1 - Progress: Goal set today  Visit Information  Last OT Received On: 09/01/11 Assistance Needed: +1    Subjective Data  Subjective: I'm moving well i think Patient Stated Goal: Go home   Prior Functioning  Home Living Lives With: Spouse Available Help at Discharge: Available PRN/intermittently Type of Home: House Home Access: Stairs to enter [employer] of Steps: 5 Entrance Stairs-Rails: Right Home Layout: One level Bathroom Shower/Tub: Forensic scientist: Standard Bathroom Accessibility: Yes How Accessible: Accessible via walker;Accessible via wheelchair Home Adaptive Equipment: Grab bars in shower;Grab bars around toilet;Shower chair with back;Walker -  rolling;Wheelchair - manual Prior Function Level of Independence: Independent Able to Take Stairs?: Yes Driving: Yes Vocation: Retired    IT consultant  Overall Cognitive Status: Appears within functional limits for tasks assessed/performed Arousal/Alertness: Awake/alert Orientation Level: Appears intact for tasks assessed Behavior During Session:  WFL for tasks performed    Extremity/Trunk Assessment Right Upper Extremity Assessment RUE ROM/Strength/Tone: Within functional levels RUE Sensation: WFL - Light Touch RUE Coordination: WFL - gross/fine motor Left Upper Extremity Assessment LUE ROM/Strength/Tone: Within functional levels LUE Sensation: WFL - Light Touch LUE Coordination: WFL - gross/fine motor   Mobility Bed Mobility Bed Mobility: Sit to Supine Sit to Supine: 7: Independent Transfers Transfers: Sit to Stand;Stand to Sit Sit to Stand: 5: Supervision;From toilet Stand to Sit: 5: Supervision;To bed Details for Transfer Assistance: Pt. with close supervision due to intermittent decreased balance and steady assist needs         End of Session OT - End of Session Equipment Utilized During Treatment: Gait belt Activity Tolerance: Patient tolerated treatment well Patient left: in bed;with call bell/phone within reach Nurse Communication: Mobility status   Jhon Mallozzi, OTR/L Pager [phone] 09/01/2011, 3:09 PM

## 2011-09-01 NOTE — Progress Notes (Signed)
Quick Note:  Sent Dr. Fields a staff message to advise! ______ 

## 2011-09-01 NOTE — Progress Notes (Signed)
[facility] [place] TEAM 1 - Stepdown/ICU TEAM  PCP:  [name]., MD, MD  Subjective: 55yoF with h/o Sjogren's disease, psoriatic arthritis, alcohol abuse, and diagnosed this month with cirrhosis who comes in with presyncope and found to have bradycardia, hypotension, and new ARF.   The pt is back to her baseline MS as per the husband's report.  Frequency of stools has improved after starting Lomotil. She is still weak and a little unsteady. She notes that her legs are swollen and is reminded that this was present and just as bad when she was first admitted. We have discussed her d/c home and plans for home health RN and PT. Her husband works during the day but she has had offers from friends to come sit with her. She does not want a home health Aide.   Objective:  Intake/Output Summary (Last 24 hours) at 09/01/11 1113 Last data filed at 09/01/11 0755  Gross per 24 hour  Intake    600 ml  Output      0 ml  Net    600 ml   Blood pressure 140/80, pulse 78, temperature 97.9 F (36.6 C), temperature source Oral, resp. rate 18, height 5\' 8"  (1.727 m), weight 123.9 kg (273 lb 2.4 oz), SpO2 96.00%.  CBG (last 3)  No results found for this basename: GLUCAP:3 in the last 72 hours Physical Exam: General: No acute respiratory distress Lungs: Clear to auscultation bilaterally without wheezes or crackles Cardiovascular: Regular rate and rhythm without murmur gallop or rub normal S1 and S2 Abdomen: Nontender, protuberent, soft, bowel sounds positive, no rebound, obvious ascites, no appreciable mass Extremities: No significant cyanosis or clubbing;  3+ edema bilateral lower extremities   Lab Results:  Basename 09/01/11 0540 08/31/11 0527 08/30/11 0717  NA 137 136 137  K 3.8 3.1* 3.2*  CL 101 100 104  CO2 25 24 24   GLUCOSE 105* 120* 116*  BUN 15 14 13   CREATININE 0.82 0.71 0.61  CALCIUM 8.4 8.7 8.3*  MG -- -- --  PHOS -- -- --    Basename 08/31/11 0527 08/30/11 0717    AST 95* 95*  ALT 58* 55*  ALKPHOS 75 77  BILITOT 3.7* 4.0*  PROT 6.4 6.3  ALBUMIN 2.2* 2.1*    Basename 08/31/11 0527 08/30/11 0717  WBC 14.8* 13.4*  NEUTROABS -- --  HGB 12.0 11.8*  HCT 33.9* 33.3*  MCV 102.4* 102.1*  PLT 151 133*   Micro Results: Recent Results (from the past 240 hour(s))  MRSA PCR SCREENING     Status: Normal   Collection Time   08/26/11  2:08 AM      Component Value Range Status Comment   MRSA by PCR NEGATIVE  NEGATIVE  Final   URINE CULTURE     Status: Normal   Collection Time   08/29/11  8:24 PM      Component Value Range Status Comment   Specimen Description URINE, CLEAN CATCH   Final    Special Requests NONE   Final    Culture  Setup Time [id number]   Final    Colony Count >=100,000 COLONIES/ML   Final    Culture KLEBSIELLA PNEUMONIAE   Final    Report Status 09/01/2011 FINAL   Final    Organism ID, Bacteria KLEBSIELLA PNEUMONIAE   Final    Studies/Results: All recent x-ray/radiology reports have been reviewed in detail.   Medications: I have reviewed the patient's complete medication list.  Assessment/Plan:  Cirrhosis,  alcoholic v/s AMA NEG PBC vs autoimmune hepatitis  Pt underwent TJLBX 5/31 at the suggestion of her outpt GI/hepatology MD - path reveals cirrhosis which is probably related to ETOH - I have spoken with Dr Darrick Penna who recommends to continue the original plan (28 days of pred 40mg  QD with slow taper to follow) -  patient is tolerating well - Dr Darrick Penna has notified me that she has a f/u appt  on June 26th.   coagulopahty Dosed w/ vitamin K without benefit (both sub Q as well as IV) - hemoglobin has been stable after liver biopsy  Bradycardia w/ presyncope Resolved - felt to be due to retention of meds due to ARF + diminished metabolism due to liver disease  Folate deficiency w/ macrocytosis Replacement continues (first via IV, and now oral)  Hypotension w/ presyncope due to diurectic induced DH - resolved w/ volume  expansion  Ascites/edema Due to cirrhosis - ascites has been expanding, and edema is worsening - attempt to adjust diuretic dosing - will be difficult, as evidenced by [facility] encountered w/ attempts to diurese her in the outpt setting - increased dosing again Output is difficult to measure as she states her urine is mixed with stool. Will follow weights and renal function. (pt is + 1470 since admit if records are to be believed)  Acute renal failure Resolved w/ volume resuscitation - watch w/ ongoing diuresis  delirium appears to be at baseline mental status at this time  Intractable vomiting Resolved   UTI UA + - on empiric tx -appears cx (though ordered) was not sent/completed - will complete 7 days of empiric tx   Colon thickening via CT abdom Dr Darrick Penna had mentioned in a her notes that due to coagulopathy, colonoscopy is not an option  Sjogren's  Should be tx w/ steroid   Diet Pt states Dr Darrick Penna has advised a high protein diet for her and recommends boost or ensure. Will add.   Dispo Continue to mobilize patient - PT/OT - add lomotil and titrate back on lactulose -  Follow diuresis and stool output today. Possibly home tomorrow. Have spoken with Case Manager.    Calvert Cantor, MD [facility] Office  [phone] Pager [phone]  On-Call/Text Page:      [email or web address]      password [facility]

## 2011-09-01 NOTE — Progress Notes (Signed)
Physical Therapy Treatment Patient Details Name: Deanna Thompson MRN: [id number] DOB: Sep 30, 1955 Today's Date: 09/01/2011 Time: 3244-0102 PT Time Calculation (min): 13 min  PT Assessment / Plan / Recommendation Comments on Treatment Session  Patient presents with presyncope and found to have bradycardia, hypotension, and new ARF with recent diagnosis of cirrhosis.  Patient with some balance issues with ambulation.  Will benefit from PT to address balance and endurance issues.  Needs RW, 3N1 and HHPT f/u.      Follow Up Recommendations  Home health PT;Supervision/Assistance - 24 hour    Barriers to Discharge  None      Equipment Recommendations  Rolling walker with 5" wheels;3 in 1 bedside comode;Tub/shower bench    Recommendations for Other Services  None  Frequency Min 3X/week   Plan Discharge plan remains appropriate;Frequency remains appropriate    Precautions / Restrictions Precautions Precautions: Fall Restrictions Weight Bearing Restrictions: No   Pertinent Vitals/Pain VSS, No pain    Mobility  Bed Mobility Bed Mobility: Sit to Supine;Rolling Right;Right Sidelying to Sit Rolling Right: 6: Modified independent (Device/Increase time) Right Sidelying to Sit: 6: Modified independent (Device/Increase time) Sit to Supine: 6: Modified independent (Device/Increase time) Details for Bed Mobility Assistance: Takes incr time to perform mobility. Transfers Transfers: Sit to Stand;Stand to Sit Sit to Stand: 5: Supervision;With upper extremity assist;From bed Stand to Sit: 5: Supervision;To bed;With upper extremity assist Stand Pivot Transfers: Not tested (comment) Details for Transfer Assistance: Cues for hand placement and needs steadying assist due to decr balance at times, with challenges.   Ambulation/Gait Ambulation/Gait Assistance: 4: Min assist Ambulation Distance (Feet): 125 Feet Assistive device: None Ambulation/Gait Assistance Details: Assist to stabilize at times  due to balance deficits. Patient ambulates with incr weight shift bil directions.  (Very rigid upright posture).  Patient with "waddling" gait and unsure of herself.   Gait Pattern: Decreased stride length;Decreased hip/knee flexion - left;Decreased hip/knee flexion - right;Wide base of support Gait velocity: Slow cadence Stairs: No Wheelchair Mobility Wheelchair Mobility: No    PT Goals Acute Rehab PT Goals PT Goal: Stand - Progress: Progressing toward goal PT Goal: Ambulate - Progress: Progressing toward goal  Visit Information  Last PT Received On: 09/01/11 Assistance Needed: +1    Subjective Data  Subjective: "I feel like I am better now and need to get up and move around."   Cognition  Overall Cognitive Status: Appears within functional limits for tasks assessed/performed Arousal/Alertness: Awake/alert Orientation Level: Appears intact for tasks assessed Behavior During Session: [facility] for tasks performed    Balance  Static Sitting Balance Static Sitting - Balance Support: No upper extremity supported;Feet supported Static Sitting - Level of Assistance: 7: Independent Static Sitting - Comment/# of Minutes: 3 Static Standing Balance Static Standing - Balance Support: Bilateral upper extremity supported;During functional activity Static Standing - Level of Assistance: 5: Stand by assistance Static Standing - Comment/# of Minutes: 2 minutes with assist for steadying initially.    End of Session PT - End of Session Equipment Utilized During Treatment: Gait belt Activity Tolerance: Patient tolerated treatment well Patient left: in bed;with call bell/phone within reach Nurse Communication: Mobility status    INGOLD,Elmira Olkowski 09/01/2011, 3:23 PM  [facility] Acute Rehabilitation [phone] (pager)

## 2011-09-01 NOTE — Telephone Encounter (Signed)
PT NEEDS APPT 6/26 E 30. KEEP APPT FOR 7/10.

## 2011-09-01 NOTE — Progress Notes (Signed)
Quick Note:  Per Dr. Fields, pt has OV with her on 09/23/2011.  ______ 

## 2011-09-02 DIAGNOSIS — E8779 Other fluid overload: Secondary | ICD-10-CM

## 2011-09-02 DIAGNOSIS — I495 Sick sinus syndrome: Secondary | ICD-10-CM

## 2011-09-02 DIAGNOSIS — F05 Delirium due to known physiological condition: Secondary | ICD-10-CM

## 2011-09-02 DIAGNOSIS — K7460 Unspecified cirrhosis of liver: Secondary | ICD-10-CM

## 2011-09-02 LAB — GLUCOSE, CAPILLARY

## 2011-09-02 MED ORDER — DEXTROSE 5 % IV SOLN: 3.0000 mg | Freq: Two times a day (BID) | INTRAVENOUS | Status: DC

## 2011-09-02 MED ORDER — DEXTROSE 5 % IV SOLN
3.0000 mg | Freq: Once | INTRAVENOUS | Status: AC
  Administered 2011-09-02: 3 mg via INTRAVENOUS
  Filled 2011-09-02: qty 12

## 2011-09-02 MED ORDER — BUMETANIDE 2 MG PO TABS
3.0000 mg | ORAL_TABLET | Freq: Two times a day (BID) | ORAL | Status: DC
  Administered 2011-09-03: 3 mg via ORAL
  Filled 2011-09-02 (×5): qty 1

## 2011-09-02 MED ORDER — ONDANSETRON HCL 4 MG/2ML IJ SOLN
4.0000 mg | Freq: Four times a day (QID) | INTRAMUSCULAR | Status: DC | PRN
  Administered 2011-09-02: 4 mg via INTRAVENOUS

## 2011-09-02 MED ORDER — BUMETANIDE 2 MG PO TABS
3.0000 mg | ORAL_TABLET | Freq: Every day | ORAL | Status: DC
  Filled 2011-09-02: qty 1

## 2011-09-02 MED ORDER — SPIRONOLACTONE 100 MG PO TABS
100.0000 mg | ORAL_TABLET | Freq: Two times a day (BID) | ORAL | Status: DC
  Administered 2011-09-03 – 2011-09-05 (×5): 100 mg via ORAL
  Filled 2011-09-02 (×9): qty 1

## 2011-09-02 MED ORDER — ONDANSETRON HCL 4 MG/5ML PO SOLN
4.0000 mg | Freq: Four times a day (QID) | ORAL | Status: DC | PRN
  Filled 2011-09-02: qty 5

## 2011-09-02 NOTE — Progress Notes (Signed)
Met with pt to complete ordering of DME and HH services. [facility] will provide HHRN and HHPT, however unable to provide cost and insurance coverage to this pt at this time. [facility] will contact pt at home prior to first vist to discuss insurance coverage. DME  Recommended by PT/OT eval to be ordered from [facility] per pt request and will be delivered to home.  Order entered by this CM to facilitate delivery so that DME will be at home when pt arrives.  Will follow for any other needs.  [name] RN MPH Case Manager [phone]

## 2011-09-02 NOTE — Progress Notes (Signed)
   CARE MANAGEMENT NOTE 09/02/2011  Patient:  Deanna Thompson, Deanna Thompson   Account Number:  [id number]  Date Initiated:  08/26/2011  Documentation initiated by:  Junius Creamer  Subjective/Objective Assessment:   adm w bradycardia     Action/Plan:   lives w spouse, pcp dr lawrence fusco  09/02/2011 Met with pt and discussed HH needs, ordered DME from [facility] per pt wishes, HH from [facility].   Anticipated DC Date:  09/02/2011   Anticipated DC Plan:  HOME W HOME HEALTH SERVICES      DC Planning Services  CM consult      PAC Choice  DURABLE MEDICAL EQUIPMENT  HOME HEALTH   Choice offered to / List presented to:  C-1 Patient   DME arranged  3-N-1  WALKER - ROLLING  TUB BENCH      DME agency  [facility]     HH arranged  HH-1 RN  HH-2 PT      Status of service:  Completed, signed off Medicare Important Message given?   (If response is "NO", the following Medicare IM given date fields will be blank) Date Medicare IM given:   Date Additional Medicare IM given:    Discharge Disposition:  HOME W HOME HEALTH SERVICES  Per UR Regulation:  Reviewed for med. necessity/level of care/duration of stay  If discussed at Long Length of Stay Meetings, dates discussed:    Comments:  09/02/2011 Met with pt and orders for DME faxed to [facility], pt will need to privately pay for tub transfer bench, pt given cost as quoted by [employer]. Pt request bench to be delivered with walker and 3:1. [facility] will consult with pt prior to d/c. Deanna Shock RN MPH...................[phone]  09/01/2011  1500 Deanna Russian RN, [place] [phone] Met with patient and spouse to discuss discharge planning and home health referral. She has used [facility] for home care in the past with her parents and selects them for RN and PT visits. Asked about the frequency of visits. Told her the nurse will assess her medical needs the first visit and determine that, the same with the PT. She requests a copy  of the costs and what insurance will cover for the visits. CM will ask [facility]. For DME: RW, 3:1 and tub transfer bench from [place]. CM will call them and request copy of costs and iinsurance coverage.  Home address:  [address]  [place], [place]  [address] Phone [phone] Cell [phone]  [facility]/ Hilda Lias:  [phone]  left vm with referral for home RN, PT and copy of coverage for the patient. Plan dc tomorrow [employer] [phone] spoke with Tammy with referral for DME: RW, 3:1 commode, tub transfer  bench FAX:  [phone] copy of face sheet and will send order when entered.  6/3 Deanna dowell rn,bsn adj lasix, watching renal.  5/29 Deanna dowell rn,bsn [phone]

## 2011-09-02 NOTE — Progress Notes (Signed)
OT Cancellation Note  Treatment cancelled today due to patient's refusal to participate. Pt states she will be receiving a tub transfer bench, 3:1, RW and HH therapy upon d/c. Offered education on utilization of transfer bench but pt stated she already knew how to use it because her mother had one. Will re-check as schedule permits.  Jabarie Pop A OTR/L [phone] 09/02/2011, 2:40 PM

## 2011-09-02 NOTE — Progress Notes (Signed)
[facility] [place] TEAM 1 - Stepdown/ICU TEAM  PCP:  [name]., MD, MD  Subjective: 55yoF with h/o Sjogren's disease, psoriatic arthritis, alcohol abuse, and diagnosed this month with cirrhosis who comes in with presyncope and found to have bradycardia, hypotension, and new ARF.   The pt is back to her baseline MS as per the husband's report.  Frequency of stools has improved after starting Lomotil. Today she c/o feeling that her abdom is "even more swollen" and relates that she is having severe GERD sx associated with this.  She continues to report severe swelling of the legs B.  She denies cp, f/c, n/v, or ha.    Objective:  Intake/Output Summary (Last 24 hours) at 09/02/11 1303 Last data filed at 09/02/11 0810  Gross per 24 hour  Intake    360 ml  Output      0 ml  Net    360 ml   Blood pressure 139/80, pulse 81, temperature 97.5 F (36.4 C), temperature source Oral, resp. rate 18, height 5\' 8"  (1.727 m), weight 124.2 kg (273 lb 13 oz), SpO2 98.00%.  CBG (last 3)   Basename 09/02/11 0714 09/01/11 0502  GLUCAP 113* 139*   Physical Exam: General: No acute respiratory distress Lungs: Clear to auscultation bilaterally without wheezes except for mild bibasilar crackles Cardiovascular: Regular rate and rhythm without murmur gallop or rub normal S1 and S2 Abdomen: Nontender, markedly protuberent, soft, bowel sounds positive, no rebound, obvious ascites, no appreciable mass Extremities: No significant cyanosis or clubbing;  3++ edema bilateral lower extremities w/o erythema  Lab Results:  [facility] 09/01/11 0540 08/31/11 0527  NA 137 136  K 3.8 3.1*  CL 101 100  CO2 25 24  GLUCOSE 105* 120*  BUN 15 14  CREATININE 0.82 0.71  CALCIUM 8.4 8.7  MG -- --  PHOS -- --    Basename 08/31/11 0527  AST 95*  ALT 58*  ALKPHOS 75  BILITOT 3.7*  PROT 6.4  ALBUMIN 2.2*    Basename 08/31/11 0527  WBC 14.8*  NEUTROABS --  HGB 12.0  HCT 33.9*  MCV 102.4*  PLT  151   Micro Results: Recent Results (from the past 240 hour(s))  MRSA PCR SCREENING     Status: Normal   Collection Time   08/26/11  2:08 AM      Component Value Range Status Comment   MRSA by PCR NEGATIVE  NEGATIVE  Final   URINE CULTURE     Status: Normal   Collection Time   08/29/11  8:24 PM      Component Value Range Status Comment   Specimen Description URINE, CLEAN CATCH   Final    Special Requests NONE   Final    Culture  Setup Time [id number]   Final    Colony Count >=100,000 COLONIES/ML   Final    Culture KLEBSIELLA PNEUMONIAE   Final    Report Status 09/01/2011 FINAL   Final    Organism ID, Bacteria KLEBSIELLA PNEUMONIAE   Final    Studies/Results: All recent x-ray/radiology reports have been reviewed in detail.   Medications: I have reviewed the patient's complete medication list.  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  Pt underwent TJLBX 5/31 at the suggestion of her outpt GI/hepatology MD - path reveals cirrhosis which is probably related to ETOH - Dr Darrick Penna recommends to continue the original plan (28 days of pred 40mg  QD with slow taper to follow) -  patient is tolerating well - Dr Darrick Penna has notified me that she has a f/u appt  on June 26th at 8:30AM  coagulopahty Dosed w/ vitamin K without benefit (both sub Q as well as IV) - hemoglobin has been stable after liver biopsy  Bradycardia w/ presyncope Resolved - felt to be due to retention of meds due to ARF + diminished metabolism due to liver disease  Folate deficiency w/ macrocytosis Replacement continues (first via IV, and now oral)  Hypotension w/ presyncope due to diurectic induced DH - resolved w/ volume expansion  Ascites/edema Due to cirrhosis - ascites has been expanding, and edema is worsening - attempting to adjust diuretic dosing - will be difficult, as evidenced by [facility] encountered w/ attempts to diurese her in the outpt setting - output is difficult to  measure as she states her urine is mixed with stool, but nonetheless, she remains POSITIVE 240cc for the day thus far, and 2310 since admit - this is consistent with her clinical exam - I have restricted her fluids to 1200cc - I have assured she is on a NO SALT diet - I am switching to bumex, and increasing her adlactone - given the difficulty we are having as an inpatient with effecting diuresis, I do not feel that we can confidently d/c her home at this time - I would like to see that she is at least swinging to the negative balance side prior to her d/c (with close oupt f/u to avoid overcorrection) - if a negative balance has not been achieved (or if we don't see that he are at least heading that way) we may need to consider a 4L paracentesis on Friday   Acute renal failure Resolved w/ volume resuscitation - watch w/ ongoing diuresis  delirium appears to be at baseline mental status at this time  Intractable vomiting Resolved   UTI UA + - on empiric tx - appears cx (though ordered) was not sent/completed - will complete 7 days of empiric tx   Colon thickening via CT abdom Dr Darrick Penna had mentioned in a her notes that due to coagulopathy, colonoscopy is not an option  Sjogren's  Should be tx w/ steroid   Diet Pt states Dr Darrick Penna has advised a high protein diet for her and recommends boost or ensure. Will add.   Dispo Continue to mobilize patient - PT/OT - add lomotil and titrate back on lactulose -  Follow diuresis (see lengthy discussion above) - postpone D/C for now - possibly home tomorrow, though I have warned the pt that it may be necessary to further extend her stay  Lonia Blood, MD [facility] Office  [phone] Pager [phone]  On-Call/Text Page:      [email or web address]      password [facility]

## 2011-09-03 DIAGNOSIS — D684 Acquired coagulation factor deficiency: Secondary | ICD-10-CM

## 2011-09-03 DIAGNOSIS — R197 Diarrhea, unspecified: Secondary | ICD-10-CM

## 2011-09-03 DIAGNOSIS — K7030 Alcoholic cirrhosis of liver without ascites: Secondary | ICD-10-CM

## 2011-09-03 DIAGNOSIS — E8779 Other fluid overload: Secondary | ICD-10-CM

## 2011-09-03 LAB — BASIC METABOLIC PANEL
Chloride: 96 mEq/L (ref 96–112)
Creatinine, Ser: 0.99 mg/dL (ref 0.50–1.10)
GFR calc Af Amer: 74 mL/min — ABNORMAL LOW (ref >90)
Potassium: 3.4 mEq/L — ABNORMAL LOW (ref 3.5–5.1)
Sodium: 135 mEq/L (ref 135–145)

## 2011-09-03 LAB — GLUCOSE, CAPILLARY: Glucose-Capillary: 136 mg/dL — ABNORMAL HIGH (ref 70–99)

## 2011-09-03 MED ORDER — BUMETANIDE 1 MG PO TABS
1.0000 mg | ORAL_TABLET | Freq: Two times a day (BID) | ORAL | Status: DC
  Administered 2011-09-03 – 2011-09-04 (×2): 1 mg via ORAL
  Filled 2011-09-03 (×4): qty 1

## 2011-09-03 MED ORDER — VERAPAMIL HCL ER 240 MG PO TBCR
240.0000 mg | EXTENDED_RELEASE_TABLET | Freq: Every day | ORAL | Status: DC
  Administered 2011-09-03 – 2011-09-04 (×2): 240 mg via ORAL
  Filled 2011-09-03 (×2): qty 1

## 2011-09-03 MED ORDER — METOPROLOL SUCCINATE ER 100 MG PO TB24: 100.0000 mg | ORAL_TABLET | Freq: Every day | ORAL | Status: DC

## 2011-09-03 MED ORDER — VERAPAMIL HCL ER 240 MG PO TBCR
240.0000 mg | EXTENDED_RELEASE_TABLET | Freq: Every day | ORAL | Status: DC
  Filled 2011-09-03: qty 1

## 2011-09-03 MED ORDER — POTASSIUM CHLORIDE CRYS ER 20 MEQ PO TBCR
40.0000 meq | EXTENDED_RELEASE_TABLET | Freq: Once | ORAL | Status: AC
  Administered 2011-09-03: 40 meq via ORAL
  Filled 2011-09-03: qty 2

## 2011-09-03 MED ORDER — METOCLOPRAMIDE HCL 5 MG/ML IJ SOLN
10.0000 mg | Freq: Once | INTRAMUSCULAR | Status: AC
  Administered 2011-09-03: 10 mg via INTRAVENOUS
  Filled 2011-09-03 (×2): qty 2

## 2011-09-03 MED ORDER — METOPROLOL SUCCINATE ER 100 MG PO TB24
100.0000 mg | ORAL_TABLET | Freq: Every day | ORAL | Status: DC
  Administered 2011-09-04: 100 mg via ORAL
  Filled 2011-09-03: qty 1

## 2011-09-03 NOTE — Progress Notes (Signed)
Occupational Therapy Treatment and Discharge Patient Details Name: KRISANNE LICH MRN: [id number] DOB: February 24, 1956 Today's Date: 09/03/2011 Time: 1191-4782 OT Time Calculation (min): 8 min  OT Assessment / Plan / Recommendation Comments on Treatment Session Pt. moving well and all current goals met and will sign off acutely.    Follow Up Recommendations  Home health OT;Supervision - Intermittent       Equipment Recommendations  Rolling walker with 5" wheels;3 in 1 bedside comode;Tub/shower bench          Plan All goals met and education completed, patient discharged from OT services    Precautions / Restrictions Precautions Precautions: None Restrictions Weight Bearing Restrictions: No       ADL  Toilet Transfer: Performed;Modified independent Toilet Transfer Method: Sit to stand;Other (comment) (ambulation) Toilet Transfer Equipment: Regular height toilet;Grab bars Toileting - Clothing Manipulation and Hygiene: Performed;Modified independent Where Assessed - Toileting Clothing Manipulation and Hygiene: Sit on 3-in-1 or toilet ADL Comments: Pt. states she has been getting to and from the bathroom with no difficulties and knows how to use the DME at home. pt. declines need for skilled OT at this time and requests to just have HHOT continue. to improve activity tolerance and get function back to baseline.       OT Goals Acute Rehab OT Goals OT Goal Formulation: With patient Time For Goal Achievement: 09/08/11 Potential to Achieve Goals: Good ADL Goals Pt Will Perform Tub/Shower Transfer: Tub transfer;with DME;Ambulation;Transfer tub bench ADL Goal: Tub/Shower Transfer - Progress: Other (comment) (Pt. able to verbalize technique and reports no needs) Additional ADL Goal #1: Pt. will recall 3 energy conservation techniques to use with ADL tasks ADL Goal: Additional Goal #1 - Progress: Met  Visit Information  Last OT Received On: 09/03/11 Assistance Needed: +1           Cognition  Overall Cognitive Status: Appears within functional limits for tasks assessed/performed Arousal/Alertness: Awake/alert Orientation Level: Appears intact for tasks assessed Behavior During Session: [facility] for tasks performed    Mobility Bed Mobility Bed Mobility: Supine to Sit;Sit to Supine Supine to Sit: 6: Modified independent (Device/Increase time);With rails;HOB elevated Sit to Supine: 6: Modified independent (Device/Increase time) Details for Bed Mobility Assistance: slightly increased time needed for bed mobility Transfers Sit to Stand: 5: Supervision;With upper extremity assist;From bed Stand to Sit: 5: Supervision;To bed;With upper extremity assist Details for Transfer Assistance: cues for hand placement and for safety      Balance Static Sitting Balance Static Sitting - Balance Support: No upper extremity supported;Feet supported Static Sitting - Level of Assistance: 7: Independent Static Standing Balance Static Standing - Level of Assistance: 5: Stand by assistance  End of Session OT - End of Session Equipment Utilized During Treatment: Gait belt Activity Tolerance: Patient tolerated treatment well Patient left: Other (comment) (in bathroom) Nurse Communication: Mobility status   Gracianna Vink, OTR/L Pager [phone] 09/03/2011, 3:13 PM

## 2011-09-03 NOTE — Progress Notes (Signed)
[facility] [place] TEAM 1 - Stepdown/ICU TEAM  PCP:  [name]., MD, MD  Subjective: 55yoF with h/o Sjogren's disease, psoriatic arthritis, alcohol abuse, and diagnosed this month with cirrhosis who comes in with presyncope and found to have bradycardia, hypotension, and new ARF.   Unable to tell if abdomen is smaller. Having to go to the bathroom every 10-20 min for urinating and also continues to have leaking of stool each time. Has no appetite and has only been tolerating water. Vomited the jello last night.   Objective:  Intake/Output Summary (Last 24 hours) at 09/03/11 1645 Last data filed at 09/03/11 1345  Gross per 24 hour  Intake    840 ml  Output      0 ml  Net    840 ml   Blood pressure 165/90, pulse 98, temperature 98.1 F (36.7 C), temperature source Oral, resp. rate 18, height 5\' 8"  (1.727 m), weight 120.2 kg (264 lb 15.9 oz), SpO2 100.00%.  CBG (last 3)   Basename 09/03/11 0731 09/02/11 0714 09/01/11 0502  GLUCAP 136* 113* 139*   Physical Exam: General: No acute respiratory distress Lungs: Clear to auscultation bilaterally without wheezes except for mild bibasilar crackles Cardiovascular: Regular rate and rhythm without murmur gallop or rub normal S1 and S2 Abdomen: Nontender, markedly protuberent, tympanic today, soft, bowel sounds positive Extremities: No significant cyanosis or clubbing;  Edema has nearly resolved ( was 3+)  Lab Results:  Basename 09/03/11 0545 09/01/11 0540  NA 135 137  K 3.4* 3.8  CL 96 101  CO2 25 25  GLUCOSE 113* 105*  BUN 19 15  CREATININE 0.99 0.82  CALCIUM 8.6 8.4  MG -- --  PHOS -- --   No results found for this basename: AST:2,ALT:2,ALKPHOS:2,BILITOT:2,PROT:2,ALBUMIN:2 in the last 72 hours No results found for this basename: WBC:3,NEUTROABS:3,HGB:3,HCT:3,MCV:3,PLT:3 in the last 72 hours Micro Results: Recent Results (from the past 240 hour(s))  MRSA PCR SCREENING     Status: Normal   Collection Time   08/26/11  2:08 AM      Component Value Range Status Comment   MRSA by PCR NEGATIVE  NEGATIVE  Final   URINE CULTURE     Status: Normal   Collection Time   08/29/11  8:24 PM      Component Value Range Status Comment   Specimen Description URINE, CLEAN CATCH   Final    Special Requests NONE   Final    Culture  Setup Time [id number]   Final    Colony Count >=100,000 COLONIES/ML   Final    Culture KLEBSIELLA PNEUMONIAE   Final    Report Status 09/01/2011 FINAL   Final    Organism ID, Bacteria KLEBSIELLA PNEUMONIAE   Final    Studies/Results: All recent x-ray/radiology reports have been reviewed in detail.   Medications: I have reviewed the patient's complete medication list.  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  Pt underwent TJLBX 5/31 at the suggestion of her outpt GI/hepatology MD - path reveals cirrhosis which is probably related to ETOH - Dr Darrick Penna recommends to continue the original plan (28 days of pred 40mg  QD with slow taper to follow) -  patient is tolerating well - Dr Darrick Penna has notified me that she has a f/u appt  on June 26th at 8:30AM  coagulopahty Dosed w/ vitamin K without benefit (both sub Q as well as IV) - hemoglobin has been stable after liver biopsy Due to cirrhosis  Bradycardia w/ presyncope Resolved - felt to be due to retention of meds due to ARF + diminished metabolism due to liver disease  Folate deficiency w/ macrocytosis Replacement continues (first via IV, and now oral)  Hypotension w/ presyncope with history of HTN due to diurectic induced DH - resolved w/ volume expansion BP now quite high- resuming Verapamil today and Metoprolol tomorrow.   Ascites/edema Due to cirrhosis - ascites has been expanding, and edema is worsening - attempting to adjust diuretic dosing - will be difficult, as evidenced by [facility] encountered w/ attempts to diurese her in the outpt setting - output is difficult to measure as she states  her urine is mixed with stool, but nonetheless, she remains POSITIVE 240cc for the day thus far, and 2310 since admit - this is consistent with her clinical exam - I have restricted her fluids to 1200cc - I have assured she is on a NO SALT diet - I am switching to bumex, and increasing her adlactone - given the difficulty we are having as an inpatient with effecting diuresis, I do not feel that we can confidently d/c her home at this time - I would like to see that she is at least swinging to the negative balance side prior to her d/c (with close oupt f/u to avoid overcorrection) -  Significantly better today- will cut back a little today- if the scale is accurate she has lost 4 kg since yesterday- physical exam has improved   Acute renal failure Resolved w/ volume resuscitation - BUN climbing now. Cutting back on Bumex from 3 bid to 1 bid. Cont aldactone.    delirium appears to be at baseline mental status at this time  Intractable vomiting and diarrhea Recurring- hold lomotil, levaquin and lactulose. If ammonia climbs, will start Xifaxin.  D/c ensure as that is also contributing to her nausea.   Tympanic distended abdomen Start Gas x- husband to get it from store- not available in pharmacy, cont to ambulate  UTI- Klebsiella Sensitive to Levaquin- has received 4 days- will stop- no recent history of frequent UTIs or bladder surgery and since she was admitted with this, it is not a complicated UTI.   Colon thickening via CT abdom Dr Darrick Penna had mentioned in a her notes that due to coagulopathy, colonoscopy is not an option  Sjogren's  Should be tx w/ steroid   Diet Pt states Dr Darrick Penna has advised a high protein diet for her and recommends boost or ensure. Will add.   Dispo Continue to mobilize patient - PT/OT -   Calvert Cantor, MD [phone]

## 2011-09-03 NOTE — Progress Notes (Signed)
Physical Therapy Treatment Patient Details Name: Deanna Thompson MRN: [id number] DOB: June 29, 1955 Today's Date: 09/03/2011 Time: 2130-8657 PT Time Calculation (min): 18 min  PT Assessment / Plan / Recommendation Comments on Treatment Session  Still not eating much per pt.   She continues to have balance and stability deficits in gait.  Will benefit from ongoing acute PT.    Follow Up Recommendations  Home health PT;Supervision/Assistance - 24 hour    Barriers to Discharge        Equipment Recommendations  Rolling walker with 5" wheels;3 in 1 bedside comode;Tub/shower bench    Recommendations for Other Services    Frequency Min 3X/week   Plan Discharge plan remains appropriate;Frequency remains appropriate    Precautions / Restrictions Precautions Precautions: None Restrictions Weight Bearing Restrictions: No   Pertinent Vitals/Pain No c/o pain, no distress    Mobility  Bed Mobility Bed Mobility: Supine to Sit;Sit to Supine Supine to Sit: 6: Modified independent (Device/Increase time);With rails;HOB elevated Sit to Supine: 6: Modified independent (Device/Increase time) Details for Bed Mobility Assistance: slightly increased time needed for bed mobility Transfers Transfers: Sit to Stand;Stand to Sit Sit to Stand: 5: Supervision;With upper extremity assist;From bed Stand to Sit: 5: Supervision;To bed;With upper extremity assist Details for Transfer Assistance: cues for hand placement and for safety Ambulation/Gait Ambulation/Gait Assistance: 4: Min assist Ambulation Distance (Feet): 100 Feet Assistive device: None Ambulation/Gait Assistance Details: Pt. with increased lateral shift/"waddling" type of gait with some instability and decreased balance noted Gait Pattern: Decreased stride length;Decreased hip/knee flexion - left;Decreased hip/knee flexion - right;Wide base of support Gait velocity: Slow cadence Stairs: No    Exercises     PT Diagnosis:    PT Problem  List:   PT Treatment Interventions:     PT Goals Acute Rehab PT Goals PT Goal: Stand - Progress: Progressing toward goal PT Goal: Ambulate - Progress: Progressing toward goal  Visit Information  Last PT Received On: 09/03/11 Assistance Needed: +1    Subjective Data  Subjective: I've been walking to the bathroom and back alot   Cognition  Overall Cognitive Status: Appears within functional limits for tasks assessed/performed Arousal/Alertness: Awake/alert Orientation Level: Appears intact for tasks assessed Behavior During Session: [facility] for tasks performed    Balance  Static Sitting Balance Static Sitting - Balance Support: No upper extremity supported;Feet supported Static Sitting - Level of Assistance: 7: Independent Static Standing Balance Static Standing - Level of Assistance: 5: Stand by assistance  End of Session PT - End of Session Equipment Utilized During Treatment: Gait belt Activity Tolerance: Patient tolerated treatment well Patient left: in bed;with call bell/phone within reach;with family/visitor present    Ferman Hamming 09/03/2011, 2:59 PM Acute Rehabilitation Services [phone] (pager)

## 2011-09-03 NOTE — Progress Notes (Signed)
Patient was vomiting red colored vomitus when initially assessed.  Per patient, she ate red jello for dinner.  She is continuing to vomit red colored vomitus.  Called [facility] for order for IV Zofran.  Order received and will be administered.  Per previous RN, patient vomited Bumex, Aldactone, and Lomotil.  Advised [facility] of this.  No orders received to re-administer the vomited medications.  Patient unable to take any PO medications tonight secondary to the nausea/vomiting.   Will give IV Zofran and continue to monitor the patient.  Shalana Jardin, Justine Null

## 2011-09-03 NOTE — Progress Notes (Signed)
Utilization review completed. Nicle Connole RN, CCM  

## 2011-09-03 NOTE — Progress Notes (Signed)
Patient is continuing to have intermittent vomiting.  She appears to be slightly disoriented but easily reoriented.  B/P 187/100.  Called [facility] and received order for one dose of IV Reglan.  Will administer as soon as received from pharmacy.  Will continue to monitor patient and will recheck B/P at 0600.  Byanca Kasper, Justine Null.  09/03/2011

## 2011-09-04 DIAGNOSIS — R197 Diarrhea, unspecified: Secondary | ICD-10-CM

## 2011-09-04 DIAGNOSIS — D684 Acquired coagulation factor deficiency: Secondary | ICD-10-CM

## 2011-09-04 DIAGNOSIS — K7030 Alcoholic cirrhosis of liver without ascites: Secondary | ICD-10-CM

## 2011-09-04 DIAGNOSIS — E8779 Other fluid overload: Secondary | ICD-10-CM

## 2011-09-04 LAB — GLUCOSE, CAPILLARY: Glucose-Capillary: 123 mg/dL — ABNORMAL HIGH (ref 70–99)

## 2011-09-04 LAB — BASIC METABOLIC PANEL
Chloride: 96 mEq/L (ref 96–112)
Creatinine, Ser: 0.93 mg/dL (ref 0.50–1.10)
GFR calc Af Amer: 79 mL/min — ABNORMAL LOW (ref >90)
Sodium: 133 mEq/L — ABNORMAL LOW (ref 135–145)

## 2011-09-04 MED ORDER — VERAPAMIL HCL ER 120 MG PO TBCR
120.0000 mg | EXTENDED_RELEASE_TABLET | Freq: Every day | ORAL | Status: DC
  Administered 2011-09-05: 120 mg via ORAL
  Filled 2011-09-04: qty 1

## 2011-09-04 MED ORDER — POTASSIUM CHLORIDE 20 MEQ/15ML (10%) PO LIQD
20.0000 meq | Freq: Every day | ORAL | Status: DC
  Administered 2011-09-04: 20 meq via ORAL
  Filled 2011-09-04 (×2): qty 15

## 2011-09-04 MED ORDER — BUMETANIDE 2 MG PO TABS
2.0000 mg | ORAL_TABLET | Freq: Two times a day (BID) | ORAL | Status: DC
  Administered 2011-09-04 – 2011-09-05 (×2): 2 mg via ORAL
  Filled 2011-09-04 (×4): qty 1

## 2011-09-04 MED ORDER — POTASSIUM CHLORIDE CRYS ER 20 MEQ PO TBCR
EXTENDED_RELEASE_TABLET | ORAL | Status: AC
  Filled 2011-09-04: qty 1

## 2011-09-04 MED ORDER — BOOST / RESOURCE BREEZE PO LIQD
1.0000 | Freq: Three times a day (TID) | ORAL | Status: DC
  Administered 2011-09-04: 16:00:00 via ORAL
  Administered 2011-09-05: 1 via ORAL

## 2011-09-04 MED ORDER — METOPROLOL SUCCINATE ER 50 MG PO TB24
50.0000 mg | ORAL_TABLET | Freq: Every day | ORAL | Status: DC
  Filled 2011-09-04: qty 1

## 2011-09-04 MED ORDER — METOPROLOL SUCCINATE ER 25 MG PO TB24: 25.0000 mg | ORAL_TABLET | Freq: Every day | ORAL | Status: DC

## 2011-09-04 NOTE — Progress Notes (Signed)
Subjective: Interval History: Husband called RN to state that Ms. Ridlon was much more confused and stating that he was to come and pick her up for discharge. Nurse notes that she has had change in mental status eg. Thought she had been moved to a different room. She denies headache, dizziness, dyspnea or visual changes  Objective: Vital signs in last 24 hours: Temp:  [97.3 F (36.3 C)-98.1 F (36.7 C)] 97.3 F (36.3 C) (06/07 1800) Pulse Rate:  [54-93] 54  (06/07 1800) Resp:  [18-20] 18  (06/07 1800) BP: (108-160)/(55-72) 108/55 mmHg (06/07 1800) SpO2:  [94 %-99 %] 94 % (06/07 1800) Weight:  [120.2 kg (264 lb 15.9 oz)] 120.2 kg (264 lb 15.9 oz) (06/06 2124)  Intake/Output from previous day: 06/06 0701 - 06/07 0700 In: 1320 [P.O.:1320] Out: -  Intake/Output this shift:   BP 108/55  Pulse 54  Temp(Src) 97.3 F (36.3 C) (Oral)  Resp 18  Ht 5\' 8"  (1.727 m)  Wt 120.2 kg (264 lb 15.9 oz)  BMI 40.29 kg/m2  SpO2 94% General appearance: alert, cooperative and no distress Head: Normocephalic, without obvious abnormality, atraumatic Eyes: PERRL, EOMI Lungs: clear to auscultation bilaterally Heart: regular rate and rhythm, S1, S2 normal, no murmur, click, rub or gallop Abdomen: distended, slightly firm, non-tender Neurologic: Mental status: Alert, oriented, thought content appropriate, thought content exhibits what appears to be some short term memory loss: she first states that "she had originally been admitted to [facility] in [place]" and then transferred here. When ask to clarify where her is -- she realizes she is still at [facility]. She told me that she thought that she was in a different room and now she realizes she has been in this room all along. She knows the date/time. Can report what State she lives, knows the [place] of the [place]. Knows her name, her husbands name and that she has issues with her liver.  Cranial nerves: normal     Results for orders placed during the  hospital encounter of 08/25/11 (from the past 24 hour(s))  BASIC METABOLIC PANEL     Status: Abnormal   Collection Time   09/04/11  5:25 AM      Component Value Range   Sodium 133 (*) 135 - 145 (mEq/L)   Potassium 3.4 (*) 3.5 - 5.1 (mEq/L)   Chloride 96  96 - 112 (mEq/L)   CO2 24  19 - 32 (mEq/L)   Glucose, Bld 115 (*) 70 - 99 (mg/dL)   BUN 16  6 - 23 (mg/dL)   Creatinine, Ser 9.81  0.50 - 1.10 (mg/dL)   Calcium 8.5  8.4 - 19.1 (mg/dL)   GFR calc non Af Amer 68 (*) >90 (mL/min)   GFR calc Af Amer 79 (*) >90 (mL/min)  GLUCOSE, CAPILLARY     Status: Abnormal   Collection Time   09/04/11  7:41 AM      Component Value Range   Glucose-Capillary 123 (*) 70 - 99 (mg/dL)   Comment 1 Documented in Chart     Comment 2 Notify RN    AMMONIA     Status: Normal   Collection Time   09/04/11  2:23 PM      Component Value Range   Ammonia 50  11 - 60 (umol/L)  GLUCOSE, CAPILLARY     Status: Abnormal   Collection Time   09/04/11  7:02 PM      Component Value Range   Glucose-Capillary 159 (*) 70 -  99 (mg/dL)   Comment 1 Documented in Chart     Comment 2 Notify RN      Studies/Results: US Abdomen Complete  08/13/2011  *RADIOLOGY REPORT*  Clinical Data:  Abnormal liver function studies.  COMPLETE ABDOMINAL ULTRASOUND  Comparison:  None  Findings:  Gallbladder:  Mild distention of the gallbladder is noted.  There is some echogenic debris and possible small gallstones.  No wall thickening or sonographic Murphy's sign to suggest acute cholecystitis.  Common bile duct:  Normal in caliber measuring a maximum of 5.104mm.  Liver:  Heterogeneous increased echogenicity and nodular contour consistent with cirrhosis.  No focal hepatic lesions are identified.  No intrahepatic biliary dilatation.  Difficulty demonstrating blood flow flow in the main portal vein. Possible portal vein thrombosis or cavernous transformation.  IVC:  Normal caliber.  Pancreas:  Limited visualization due to overlying bowel gas and body habitus.   Spleen:  Normal in size and echogenicity without focal lesions.  Right Kidney:  12.1 cm in length. Normal renal cortical thickness and echogenicity without focal lesions or hydronephrosis.  Left Kidney:  12.5 cm in length. Normal renal cortical thickness and echogenicity without focal lesions or hydronephrosis.  Abdominal aorta:  Normal caliber.  Additional findings:  Moderate volume ascites.  IMPRESSION:  1.  Cirrhotic changes involving the liver without focal hepatic lesion. 2.  Poorly demonstrated blood flow in the main portal vein. Possible thrombosis or cavernous transformation. 3.  The spleen is upper limits of normal in size. 4.  Moderate volume ascites. 5.  Mildly distended gallbladder with sludge and small stones but no findings for acute cholecystitis. 6.  Normal caliber common bile duct.  Original Report Authenticated By: P. Loralie Champagne, M.D.   Ct Abdomen Pelvis W Contrast  08/20/2011  *RADIOLOGY REPORT*  Clinical Data: Cirrhosis.  Cholelithiasis.  Possible portal vein thrombosis on abdominal ultrasound.  CT ABDOMEN AND PELVIS WITH CONTRAST  Technique:  Multidetector CT imaging of the abdomen and pelvis was performed following the standard protocol during bolus administration of intravenous contrast.  Contrast: OMNIPAQUE IOHEXOL 300 MG/ML  SOLN  Comparison: None.  Findings: Hepatic cirrhosis is demonstrated, but no liver masses are identified. Portal and splenic veins are patent.  Collateral vessels seen in the upper abdomen are consistent with portal venous hypertension.  No evidence of splenomegaly.  Moderate ascites seen throughout the abdomen and pelvis.  Gallbladder is distended and contains multiple small gallstones, however there is no evidence of gallbladder wall thickening or biliary dilatation. The pancreas, adrenal glands, and kidneys are normal in appearance.  Uterus and adnexa are unremarkable.  No soft tissue masses or lymphadenopathy identified.  Moderate right-sided colonic  wall thickening is seen, which does not appear to involve the remainder of the colon or small bowel. Although this could be related to hypoalbuminemia, right-sided colitis cannot be excluded.   No evidence of abscess or bowel obstruction.  IMPRESSION:  1.  Hepatic cirrhosis and portal venous hypertension.  No evidence of portal vein thrombosis  or hepatic neoplasm. 2.  Moderate ascites.  No evidence of splenomegaly. 3.  Distended gallbladder containing small gallstones.  No evidence of gallbladder wall thickening or biliary dilatation. 4.  Right colonic wall thickening.  Differential diagnosis includes hypoalbuminemia and right-sided colitis.  Suggest clinical correlation for signs and symptoms of colitis.  Original Report Authenticated By: Danae Orleans, M.D.   Ir Venogram Hepatic Wo Hemodynamic Evaluation  08/28/2011  *RADIOLOGY REPORT*  Clinical Data: Cirrhosis, coagulopathy  ULTRASOUND GUIDANCE  VASCULAR ACCESS TRANSJUGULAR HEPATIC LIVER BIOPSY  Date:  08/28/2011 13:15:00  Radiologist:  M. Ruel Favors, M.D.  Medications:  3.5 mg Versed, 100 mcg Fentanyl  Guidance:  Ultrasound and fluoroscopic  Fluoroscopy time:  8.1 minutes  Sedation time:  30 minutes  Contrast volume:  15 ml Omnipaque-300  Complications:  No immediate  PROCEDURE/FINDINGS:  Informed consent was obtained from the patient following explanation of the procedure, risks, benefits and alternatives. The patient understands, agrees and consents for the procedure. All questions were addressed.  A time out was performed.  Maximal barrier sterile technique utilized including caps, mask, sterile gowns, sterile gloves, large sterile drape, hand hygiene, and betadine  Under sterile conditions and local anesthesia,  right IJ micropuncture venous access performed.  Guide wire advanced. Access was advanced into the right hepatic lobe.  Over Amplatz guide wire, the transjugular liver biopsy sheath was advanced. Needle was advanced through the sheath.  Several  passes were made with the 18 gauge transcatheter biopsy needle.  Eventually 3  core liver biopsies were obtained and placed in formalin.  Contrast injection confirmed position in the right hepatic vein.  Images again obtained for documentation.  Access removed.  No immediate complication.  The patient tolerated the biopsy well.  IMPRESSION: Successful ultrasound and fluoroscopic transjugular liver core biopsy  Original Report Authenticated By: Judie Petit. Ruel Favors, M.D.   Ir Transcatheter Bx  08/28/2011  *RADIOLOGY REPORT*  Clinical Data: Cirrhosis, coagulopathy  ULTRASOUND GUIDANCE VASCULAR ACCESS TRANSJUGULAR HEPATIC LIVER BIOPSY  Date:  08/28/2011 13:15:00  Radiologist:  M. Ruel Favors, M.D.  Medications:  3.5 mg Versed, 100 mcg Fentanyl  Guidance:  Ultrasound and fluoroscopic  Fluoroscopy time:  8.1 minutes  Sedation time:  30 minutes  Contrast volume:  15 ml Omnipaque-300  Complications:  No immediate  PROCEDURE/FINDINGS:  Informed consent was obtained from the patient following explanation of the procedure, risks, benefits and alternatives. The patient understands, agrees and consents for the procedure. All questions were addressed.  A time out was performed.  Maximal barrier sterile technique utilized including caps, mask, sterile gowns, sterile gloves, large sterile drape, hand hygiene, and betadine  Under sterile conditions and local anesthesia,  right IJ micropuncture venous access performed.  Guide wire advanced. Access was advanced into the right hepatic lobe.  Over Amplatz guide wire, the transjugular liver biopsy sheath was advanced. Needle was advanced through the sheath.  Several passes were made with the 18 gauge transcatheter biopsy needle.  Eventually 3  core liver biopsies were obtained and placed in formalin.  Contrast injection confirmed position in the right hepatic vein.  Images again obtained for documentation.  Access removed.  No immediate complication.  The patient tolerated the biopsy  well.  IMPRESSION: Successful ultrasound and fluoroscopic transjugular liver core biopsy  Original Report Authenticated By: Judie Petit. Ruel Favors, M.D.   Ir US Guide Vasc Access Right  08/28/2011  *RADIOLOGY REPORT*  Clinical Data: Cirrhosis, coagulopathy  ULTRASOUND GUIDANCE VASCULAR ACCESS TRANSJUGULAR HEPATIC LIVER BIOPSY  Date:  08/28/2011 13:15:00  Radiologist:  M. Ruel Favors, M.D.  Medications:  3.5 mg Versed, 100 mcg Fentanyl  Guidance:  Ultrasound and fluoroscopic  Fluoroscopy time:  8.1 minutes  Sedation time:  30 minutes  Contrast volume:  15 ml Omnipaque-300  Complications:  No immediate  PROCEDURE/FINDINGS:  Informed consent was obtained from the patient following explanation of the procedure, risks, benefits and alternatives. The patient understands, agrees and consents for the procedure. All questions were addressed.  A time out  was performed.  Maximal barrier sterile technique utilized including caps, mask, sterile gowns, sterile gloves, large sterile drape, hand hygiene, and betadine  Under sterile conditions and local anesthesia,  right IJ micropuncture venous access performed.  Guide wire advanced. Access was advanced into the right hepatic lobe.  Over Amplatz guide wire, the transjugular liver biopsy sheath was advanced. Needle was advanced through the sheath.  Several passes were made with the 18 gauge transcatheter biopsy needle.  Eventually 3  core liver biopsies were obtained and placed in formalin.  Contrast injection confirmed position in the right hepatic vein.  Images again obtained for documentation.  Access removed.  No immediate complication.  The patient tolerated the biopsy well.  IMPRESSION: Successful ultrasound and fluoroscopic transjugular liver core biopsy  Original Report Authenticated By: Judie Petit. Ruel Favors, M.D.   Dg Chest Portable 1 View  08/25/2011  *RADIOLOGY REPORT*  Clinical Data: Shortness of breath  PORTABLE CHEST - 1 VIEW  Comparison: 01/22/2011  Findings: Apparent  enlargement of the cardiac silhouette is favored to be artifactual secondary to decreased lung volumes and projection.  Unchanged mediastinal contours.  Bibasilar opacities favored to represent atelectasis.  No definite pleural effusion or pneumothorax.  Grossly unchanged bones.  IMPRESSION: Apparent enlargement cardiac silhouette is favored to be secondary to decreased lung volumes and projection. No definite acute cardiopulmonary disease.  Further evaluation with a PA and lateral chest radiograph may be obtained as clinically indicated.  Original Report Authenticated By: Waynard Reeds, M.D.   Dg Knee 3 Views Left  08/28/2011  Inocente Salles. Shick, MD     08/28/2011  2:29 PM Successful TJLBX No comp Stable Path pending    Scheduled Meds:   . bumetanide  2 mg Oral BID  . escitalopram  10 mg Oral Daily  . famotidine  20 mg Oral BID  . feeding supplement  1 Container Oral TID BM  . folic acid  1 mg Oral Daily  . metoprolol succinate  50 mg Oral Daily  . potassium chloride  20 mEq Oral Daily  . potassium chloride SA      . predniSONE  40 mg Oral Q breakfast  . spironolactone  100 mg Oral BID  . tetrahydrozoline  1 drop Both Eyes BID  . verapamil  120 mg Oral Daily  . DISCONTD: bumetanide  1 mg Oral BID  . DISCONTD: metoprolol succinate  100 mg Oral Daily  . DISCONTD: metoprolol succinate  25 mg Oral Daily  . DISCONTD: verapamil  240 mg Oral Daily   Continuous Infusions:  PRN Meds:calcium carbonate, ondansetron  Assessment/Plan:  Mental status change: on examination there appears to be minimal abnormality. Patient is alert, calm and cooperative, regarding orientation she is not totally oriented but appears to re-orient well with prompting. Ammonia  Level is within normal limits at 50 but this is up from prior evaluation (32). Will recheck to see if this is continuing to climb. There is no evidence of CVA so will defer CT head at this time. Glucose is OK  (159). She has not had any medications  that would alter her mental status eg pain or sedative medications. No fevers or hypotension to indicate sepsis. Will continue to monitor.     LOS: 10 days   Caetano Oberhaus A.

## 2011-09-04 NOTE — Progress Notes (Addendum)
[facility] TEAM 1 - Stepdown/ICU TEAM  PCP:  [name]., MD, MD  Subjective: 55yoF with h/o Sjogren's disease, psoriatic arthritis, alcohol abuse, and diagnosed this month with cirrhosis who comes in with presyncope and found to have bradycardia, hypotension, and new ARF.   Diarrhea is slightly better. Abdomen still distended but she is now able to eat again and has had lunch. Feels a little dizzy today  Objective:  Intake/Output Summary (Last 24 hours) at 09/04/11 1411 Last data filed at 09/04/11 0700  Gross per 24 hour  Intake    960 ml  Output      0 ml  Net    960 ml   Blood pressure 151/68, pulse 85, temperature 97.6 F (36.4 C), temperature source Oral, resp. rate 20, height 5\' 8"  (1.727 m), weight 120.2 kg (264 lb 15.9 oz), SpO2 98.00%.  CBG (last 3)   Basename 09/04/11 0741 09/03/11 0731 09/02/11 0714  GLUCAP 123* 136* 113*   Physical Exam: General: No acute respiratory distress Lungs: Clear to auscultation bilaterally without wheezes except for mild bibasilar crackles Cardiovascular: Regular rate and rhythm without murmur gallop or rub normal S1 and S2 Abdomen: Nontender, markedly protuberent, tympanic , soft, bowel sounds positive Extremities: No significant cyanosis or clubbing;  Edema is more today than yesterday. 2+  Lab Results:  Basename 09/04/11 0525 09/03/11 0545  NA 133* 135  K 3.4* 3.4*  CL 96 96  CO2 24 25  GLUCOSE 115* 113*  BUN 16 19  CREATININE 0.93 0.99  CALCIUM 8.5 8.6  MG -- --  PHOS -- --   No results found for this basename: AST:2,ALT:2,ALKPHOS:2,BILITOT:2,PROT:2,ALBUMIN:2 in the last 72 hours No results found for this basename: WBC:3,NEUTROABS:3,HGB:3,HCT:3,MCV:3,PLT:3 in the last 72 hours Micro Results: Recent Results (from the past 240 hour(s))  MRSA PCR SCREENING     Status: Normal   Collection Time   08/26/11  2:08 AM      Component Value Range Status Comment   MRSA by PCR NEGATIVE  NEGATIVE  Final     URINE CULTURE     Status: Normal   Collection Time   08/29/11  8:24 PM      Component Value Range Status Comment   Specimen Description URINE, CLEAN CATCH   Final    Special Requests NONE   Final    Culture  Setup Time [id number]   Final    Colony Count >=100,000 COLONIES/ML   Final    Culture KLEBSIELLA PNEUMONIAE   Final    Report Status 09/01/2011 FINAL   Final    Organism ID, Bacteria KLEBSIELLA PNEUMONIAE   Final    Studies/Results: All recent x-ray/radiology reports have been reviewed in detail.   Medications: I have reviewed the patient's complete medication list.  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  Pt underwent TJLBX 5/31 at the suggestion of her outpt GI/hepatology MD - path reveals cirrhosis which is probably related to ETOH - Dr Darrick Penna recommends to continue the original plan (28 days of pred 40mg  QD with slow taper to follow) -  Start date 6/1 patient is tolerating well - Dr Darrick Penna has notified me that she has a f/u appt  on June 26th at 8:30AM  coagulopahty Dosed w/ vitamin K without benefit (both sub Q as well as IV) - hemoglobin has been stable after liver biopsy Due to cirrhosis  Bradycardia w/ presyncope Resolved - felt to be due to retention of meds due  to ARF + diminished metabolism due to liver disease BP was elevated and both Metoprolol and Verapamil resumed.   Folate deficiency w/ macrocytosis Replacement continues (first via IV, and now oral)  Hypotension w/ presyncope with history of HTN due to diurectic induced DH - resolved w/ volume expansion BP quite high- resumed Verapamil and Metoprolol   Ascites/edema Due to cirrhosis - adjusting dose of Bumex and Aldactone daily  Acute renal failure Resolved w/ volume resuscitation - BUN climbing now.attempted to cut back bumex yesterday but now edematous- will check her weight today as I and O inaccurate.   delirium appears to be at baseline mental status at  this time  Intractable vomiting and diarrhea Recurr- hold lomotil, levaquin and lactulose. Improved slightly with these measures  If ammonia climbs, will start Xifaxin.  D/c ensure as that is also contributing to her nausea.   Tympanic distended abdomen Started Gas x today  cont to ambulate  UTI- Klebsiella Sensitive to Levaquin- has received 4 days- will stop- no recent history of frequent UTIs or bladder surgery and since she was admitted with this, it is not a complicated UTI.   Colon thickening via CT abdom Dr Darrick Penna had mentioned in a her notes that due to coagulopathy, colonoscopy is not an option  Sjogren's  Should be tx w/ steroid   Diet Pt states Dr Darrick Penna has advised a high protein diet for her and recommends boost or ensure. She is not tolerating ensure. Try boost  Dispo Continue to mobilize patient - PT/OT -   Calvert Cantor, MD [phone]

## 2011-09-04 NOTE — Progress Notes (Signed)
Physical Therapy Treatment Patient Details Name: Deanna Thompson MRN: [id number] DOB: March 05, 1956 Today's Date: 09/04/2011 Time: 4098-1191 PT Time Calculation (min): 15 min  PT Assessment / Plan / Recommendation Comments on Treatment Session  Pt. needs enocuragement to increase her activity level.  She was educated that she and husband should walk together in hallways.  I also believe she may feel more stable with cane.  Will try cane next visit.    Follow Up Recommendations  Home health PT;Supervision/Assistance - 24 hour    Barriers to Discharge        Equipment Recommendations  Rolling walker with 5" wheels;3 in 1 bedside comode;Tub/shower bench ; may need to adjust RW recommendation depending on how she does with cane.   Recommendations for Other Services    Frequency Min 3X/week   Plan Discharge plan remains appropriate;Frequency remains appropriate    Precautions / Restrictions Precautions Precautions: None Restrictions Weight Bearing Restrictions: No   Pertinent Vitals/Pain No pain    Mobility  Bed Mobility Bed Mobility: Supine to Sit;Sit to Supine Rolling Right: 6: Modified independent (Device/Increase time) Supine to Sit: 6: Modified independent (Device/Increase time) Sit to Supine: 6: Modified independent (Device/Increase time) Details for Bed Mobility Assistance: no problems Transfers Transfers: Sit to Stand;Stand to Sit Sit to Stand: 6: Modified independent (Device/Increase time);From bed;With upper extremity assist Stand to Sit: 6: Modified independent (Device/Increase time);To bed;With upper extremity assist Details for Transfer Assistance: no apparent safety issuses today Ambulation/Gait Ambulation/Gait Assistance: 4: Min assist Ambulation Distance (Feet): 120 Feet Assistive device: None Ambulation/Gait Assistance Details: still with  increased lateral shifting Gait Pattern: Decreased stride length;Decreased hip/knee flexion - left;Decreased hip/knee  flexion - right;Wide base of support Gait velocity: Slow cadence Stairs: No    Exercises     PT Diagnosis:    PT Problem List:   PT Treatment Interventions:     PT Goals Acute Rehab PT Goals PT Goal: Ambulate - Progress: Progressing toward goal  Visit Information  Last PT Received On: 09/04/11 Assistance Needed: +1    Subjective Data  Subjective: I needed you to make me walk.  I know it's good for me.   Cognition  Overall Cognitive Status: Appears within functional limits for tasks assessed/performed Arousal/Alertness: Awake/alert Orientation Level: Appears intact for tasks assessed Behavior During Session: [facility] for tasks performed    Balance     End of Session PT - End of Session Equipment Utilized During Treatment: Gait belt Activity Tolerance: Patient tolerated treatment well Patient left: in bed;with call bell/phone within reach Nurse Communication: Mobility status    Ferman Hamming 09/04/2011, 2:37 PM Weldon Picking PT Acute Rehab Services [phone] [place] [phone]

## 2011-09-05 DIAGNOSIS — K7030 Alcoholic cirrhosis of liver without ascites: Secondary | ICD-10-CM

## 2011-09-05 DIAGNOSIS — R197 Diarrhea, unspecified: Secondary | ICD-10-CM

## 2011-09-05 DIAGNOSIS — E8779 Other fluid overload: Secondary | ICD-10-CM

## 2011-09-05 DIAGNOSIS — D684 Acquired coagulation factor deficiency: Secondary | ICD-10-CM

## 2011-09-05 LAB — BASIC METABOLIC PANEL
BUN: 18 mg/dL (ref 6–23)
CO2: 25 mEq/L (ref 19–32)
Calcium: 8.1 mg/dL — ABNORMAL LOW (ref 8.4–10.5)
GFR calc non Af Amer: 68 mL/min — ABNORMAL LOW (ref >90)
Glucose, Bld: 98 mg/dL (ref 70–99)

## 2011-09-05 LAB — GLUCOSE, CAPILLARY: Glucose-Capillary: 99 mg/dL (ref 70–99)

## 2011-09-05 MED ORDER — ONDANSETRON HCL 4 MG PO TABS: 4.0000 mg | ORAL_TABLET | Freq: Four times a day (QID) | ORAL | Status: DC | PRN

## 2011-09-05 MED ORDER — SPIRONOLACTONE 100 MG PO TABS: 100.0000 mg | ORAL_TABLET | Freq: Two times a day (BID) | ORAL | Status: DC

## 2011-09-05 MED ORDER — BUMETANIDE 2 MG PO TABS: 2.0000 mg | ORAL_TABLET | Freq: Two times a day (BID) | ORAL | Status: DC

## 2011-09-05 MED ORDER — POTASSIUM CHLORIDE 20 MEQ/15ML (10%) PO LIQD
40.0000 meq | Freq: Every day | ORAL | Status: DC
Start: 2011-09-05 — End: 2011-09-05
  Administered 2011-09-05: 40 meq via ORAL
  Filled 2011-09-05: qty 30

## 2011-09-05 MED ORDER — FOLIC ACID 1 MG PO TABS: 1.0000 mg | ORAL_TABLET | Freq: Every day | ORAL | Status: DC

## 2011-09-05 MED ORDER — POTASSIUM CHLORIDE 20 MEQ/15ML (10%) PO LIQD: 40.0000 meq | Freq: Every day | ORAL | Status: DC

## 2011-09-05 MED ORDER — PREDNISONE 20 MG PO TABS: 40.0000 mg | ORAL_TABLET | Freq: Every day | ORAL | Status: AC

## 2011-09-05 NOTE — Progress Notes (Signed)
Physical Therapy Treatment Patient Details Name: Deanna Thompson MRN: [id number] DOB: 1955/12/06 Today's Date: 09/05/2011 Time: 3086-5784 PT Time Calculation (min): 28 min  PT Assessment / Plan / Recommendation Comments on Treatment Session  Discussed mobility at home, need to increase walking, staying up in chair. Husband to purchase cane for pt. Pt educated on fitting cane height to self. Educated pt on needing supervision/asssit with mobility at home for safety. Pt verbalizes understanding of all education.    Follow Up Recommendations  Home health PT;Supervision/Assistance - 24 hour       Equipment Recommendations  Rolling walker with 5" wheels;3 in 1 bedside comode;Tub/shower bench       Frequency Min 3X/week      Precautions / Restrictions Precautions Precautions: Fall       Mobility  Bed Mobility Bed Mobility: Supine to Sit;Sit to Supine Rolling Right: 6: Modified independent (Device/Increase time) Supine to Sit: 6: Modified independent (Device/Increase time) Sit to Supine: 6: Modified independent (Device/Increase time) Details for Bed Mobility Assistance: no problems Transfers Transfers: Sit to Stand;Stand to Sit Sit to Stand: 6: Modified independent (Device/Increase time);From bed;With upper extremity assist Stand to Sit: 6: Modified independent (Device/Increase time);To bed;With upper extremity assist Details for Transfer Assistance: no apparent safety issuses today Ambulation/Gait Ambulation/Gait Assistance: 5: Supervision Assistive device: Straight cane Gait Pattern: Decreased stride length;Decreased hip/knee flexion - left;Decreased hip/knee flexion - right;Wide base of support Gait velocity: Slow cadence Stairs: No    Exercises Other Exercises Other Exercises: Attempted 10 repetitions of sit <> stand for strength/endurance, pt able to complete 7 then stated "that's all for today"     PT Goals Acute Rehab PT Goals PT Goal Formulation: With  patient Time For Goal Achievement: 09/10/11 Potential to Achieve Goals: Good Pt will Stand: Independently;3 - 5 min;with no upper extremity support Pt will Ambulate: 51 - 150 feet;with supervision;with least restrictive assistive device  Visit Information  Last PT Received On: 09/05/11 Assistance Needed: +1    Subjective Data  Subjective: Yes I would like to try a cane   Cognition  Overall Cognitive Status: Appears within functional limits for tasks assessed/performed Arousal/Alertness: Awake/alert Orientation Level: Appears intact for tasks assessed Behavior During Session: [facility] for tasks performed    Balance  Static Sitting Balance Static Sitting - Balance Support: No upper extremity supported;Feet supported Static Sitting - Level of Assistance: 7: Independent Static Standing Balance Static Standing - Level of Assistance: 5: Stand by assistance  End of Session PT - End of Session Equipment Utilized During Treatment: Gait belt Activity Tolerance: Patient tolerated treatment well Patient left: with call bell/phone within reach;in chair Nurse Communication: Mobility status    Wilhemina Bonito 09/05/2011, 5:28 PM  Dahlia Client (Beverely Pace) Carleene Mains PT, DPT Acute Rehabilitation [phone]

## 2011-09-05 NOTE — Progress Notes (Signed)
Reviewed discharge instructions and medications with patient and spouse. Explained importance of daily weight, same scale, same time and clothing. Patient verbalized understanding. Patient repeated back and wrote notes on d/c instructions about daily weights. Patient given care notes on bradycardia. All questions answered. Steele Berg RN

## 2011-09-05 NOTE — Progress Notes (Signed)
   CARE MANAGEMENT NOTE 09/05/2011  Patient:  Deanna Thompson, Deanna Thompson   Account Number:  [id number]  Date Initiated:  08/26/2011  Documentation initiated by:  Junius Creamer  Subjective/Objective Assessment:   adm w bradycardia     Action/Plan:   lives w spouse, pcp dr lawrence fusco  09/02/2011 Met with pt and discussed HH needs, ordered DME from [facility] per pt wishes, HH from [facility].   Anticipated DC Date:  09/02/2011   Anticipated DC Plan:  HOME W HOME HEALTH SERVICES      DC Planning Services  CM consult      PAC Choice  DURABLE MEDICAL EQUIPMENT  HOME HEALTH   Choice offered to / List presented to:  C-1 Patient   DME arranged  3-N-1  WALKER - ROLLING  TUB BENCH      DME agency  [facility]     HH arranged  HH-1 RN  HH-2 PT      HH agency  [facility]   Status of service:  Completed, signed off Medicare Important Message given?   (If response is "NO", the following Medicare IM given date fields will be blank) Date Medicare IM given:   Date Additional Medicare IM given:    Discharge Disposition:  HOME W HOME HEALTH SERVICES  Per UR Regulation:  Reviewed for med. necessity/level of care/duration of stay  If discussed at Long Length of Stay Meetings, dates discussed:    Comments:  09/05/2011 1400 Spoke to pt and states she does not want her RW that was delivered by [employer]. States she cannot move around in her home. NCM explained that they will need to contact [facility] to return RW. States she will purchase cane out of pocket. Contacted [facility] for scheduled d/c today. Lab works were added and [facility] is aware. Isidoro Donning RN CCM Case Mgmt phone [phone]  09/02/2011 Met with pt and orders for DME faxed to [facility], pt will need to privately pay for tub transfer bench, pt given cost as quoted by [employer]. Pt request bench to be delivered with walker and 3:1. [facility] will consult with pt prior to  d/c. [name] RN MPH...................[phone]  09/01/2011  1500 [name] RN, [place] [phone] Met with patient and spouse to discuss discharge planning and home health referral. She has used [facility] for home care in the past with her parents and selects them for RN and PT visits. Asked about the frequency of visits. Told her the nurse will assess her medical needs the first visit and determine that, the same with the PT. She requests a copy of the costs and what insurance will cover for the visits. CM will ask [facility]. For DME: RW, 3:1 and tub transfer bench from [place]. CM will call them and request copy of costs and iinsurance coverage.  Home address:  [address]  [place], [place]  [address] Phone [phone] Cell [phone]  [facility]/ Hilda Lias:  [phone]  left vm with referral for home RN, PT and copy of coverage for the patient. Plan dc tomorrow [facility] [phone] spoke with Tammy with referral for DME: RW, 3:1 commode, tub transfer  bench FAX:  [phone] copy of face sheet and will send order when entered.  6/3 debbie dowell rn,bsn adj lasix, watching renal.  5/29 debbie dowell rn,bsn [phone]

## 2011-09-08 ENCOUNTER — Telehealth: Payer: Self-pay | Admitting: Gastroenterology

## 2011-09-08 NOTE — Telephone Encounter (Signed)
LMOM to call.

## 2011-09-08 NOTE — Telephone Encounter (Signed)
Pt's husband called and was informed. He also said that she previously was on Zantac, generic, 300 mg bid. He wants to know if it is OK for her to resume that .

## 2011-09-08 NOTE — Telephone Encounter (Signed)
PLEASE CALL PT. WE WANT HER TO LOSE WEIGHT. THAT INDICATES HER FLUID IS BETTER CONTROLLED.

## 2011-09-08 NOTE — Telephone Encounter (Signed)
Pt asked to speak with [facility] nurse or PA . I told her that I would have SF nurse call her back at [phone]

## 2011-09-08 NOTE — Telephone Encounter (Signed)
Called pt. She said [facility] came out on Mon, yesterday. They asked her to call Dr. Darrick Penna office if she had a wt gain of 5 lbs. However, she said she had a 5 lb wt loss last night. She weighed 255.2 lbs yesterday and she weighed 250.0 lbs this AM. She is weighing between 9-10 AM and before breakfast daily. Please advise as to when you want pt to call office here. ( She has OV appt on 09/23/2011).

## 2011-09-09 NOTE — Telephone Encounter (Signed)
PLEASE CALL PT. IF PRILOSEC CAUSED SOB, USING ZANTAC WILL BE FINE.

## 2011-09-09 NOTE — Telephone Encounter (Signed)
PLEASE CALL PT. SHE SHOULD TAKE PRILOSEC 20 MG EVERY MORNING TO PREVENT ULCERS, #31 [id number]. SHE MAY CONTINUE TO USE ZANTAC AS NEEDED FOR HEARTBURN OR INDIGESTION.

## 2011-09-09 NOTE — Telephone Encounter (Signed)
Called and informed Primitivo Gauze, pt's husband. He said that Dr. Willeen Cass put her on the Omprazole/Prilosec and it caused shortness of breath. Please advise!

## 2011-09-09 NOTE — Telephone Encounter (Signed)
LMOM to call.

## 2011-09-10 NOTE — Telephone Encounter (Signed)
Called and informed pt.  

## 2011-09-16 ENCOUNTER — Telehealth: Payer: Self-pay

## 2011-09-16 NOTE — Telephone Encounter (Signed)
PLEASE CALL PT. WOULD NOT RECOMMEND ZEGERID. IT CONTAINES OMEPRAZOLE. SHE MAY DROP BY AND PICK UP SAMPLES OF NEXIUM #10 TO SEE IF HER SX CAN BE CONTROLLED WITHOUT MED SIDE EFFECTS. SHE SHOULD TAKE IT 30 MINUTES PRIOR TO HER FIRST MEAL.

## 2011-09-16 NOTE — Telephone Encounter (Signed)
Pt informed and samples of Nexium at the front, #10.

## 2011-09-16 NOTE — Telephone Encounter (Signed)
PT said the Zantac is not helping he at all. She wants to know if Dr. Darrick Penna would recommend her trying Zegerid. ( she uses [facility]).

## 2011-09-22 NOTE — Telephone Encounter (Signed)
CALLED PT TO DISCUSS UP DATE AND LABS. JUN 13 K 3.3 AMMONIA 30. OPV 6/26 AT 0830.

## 2011-09-23 ENCOUNTER — Ambulatory Visit (INDEPENDENT_AMBULATORY_CARE_PROVIDER_SITE_OTHER): Payer: BC Managed Care – PPO | Admitting: Gastroenterology

## 2011-09-23 ENCOUNTER — Encounter (HOSPITAL_COMMUNITY): Payer: Self-pay

## 2011-09-23 ENCOUNTER — Encounter: Payer: Self-pay | Admitting: Gastroenterology

## 2011-09-23 ENCOUNTER — Telehealth: Payer: Self-pay | Admitting: Gastroenterology

## 2011-09-23 VITALS — BP 131/86 | HR 95 | Temp 96.7°F | Ht 68.0 in | Wt 220.0 lb

## 2011-09-23 DIAGNOSIS — K7010 Alcoholic hepatitis without ascites: Secondary | ICD-10-CM

## 2011-09-23 DIAGNOSIS — K7460 Unspecified cirrhosis of liver: Secondary | ICD-10-CM

## 2011-09-23 DIAGNOSIS — K625 Hemorrhage of anus and rectum: Secondary | ICD-10-CM | POA: Insufficient documentation

## 2011-09-23 LAB — PROTIME-INR
INR: 1.8 — ABNORMAL HIGH (ref <1.50)
Prothrombin Time: 21.5 seconds — ABNORMAL HIGH (ref 11.6–15.2)

## 2011-09-23 LAB — COMPREHENSIVE METABOLIC PANEL
ALT: 70 U/L — ABNORMAL HIGH (ref 0–35)
Albumin: 3.1 g/dL — ABNORMAL LOW (ref 3.5–5.2)
Alkaline Phosphatase: 72 U/L (ref 39–117)
CO2: 24 mEq/L (ref 19–32)
Glucose, Bld: 107 mg/dL — ABNORMAL HIGH (ref 70–99)
Potassium: 4.2 mEq/L (ref 3.5–5.3)
Sodium: 135 mEq/L (ref 135–145)
Total Protein: 6.7 g/dL (ref 6.0–8.3)

## 2011-09-23 MED ORDER — ESOMEPRAZOLE MAGNESIUM 40 MG PO CPDR: 40.0000 mg | DELAYED_RELEASE_CAPSULE | Freq: Every day | ORAL | Status: DC

## 2011-09-23 MED ORDER — PEG 3350-KCL-NA BICARB-NACL 420 G PO SOLR: ORAL | Status: AC

## 2011-09-23 NOTE — Telephone Encounter (Signed)
Patient called and needed to R/S her procedure from 07/09 to 07/16 and she is now scheduled w/SLF on 07/16 at 9:30 am

## 2011-09-23 NOTE — Patient Instructions (Addendum)
STOP THE ALDACTONE AND POTASSIUM.  TAPER OFF PREDNISONE. START TAKING 30 MG DAILY FOR 5 DAYS THEN 2 DAILY FOR 5 DAYS THEN 1 DAILY FOR 5 DAYS THEN STOP.  START A PROBIOTIC DAILY.  GET YOUR LABS DRAWN.  UPPER AND LOWER ENDOSCOPY IN 2-3 WEEKS.  FOLLOW UP IN AUG 2013. GET VACCINATION IN AUG 2013.

## 2011-09-23 NOTE — Assessment & Plan Note (Signed)
LIKELY DUE TO HEMORRHOIDS, LESS LIKELY RECTAL VARICES IN SETTING OF INR 1.99 AND PLT CT 126k.  LOWER ENDOSCOPY IN 2-3 WEEKS WITH PROPOFOL DUE TO CHRONIC ETOH USE. NEEDS PT/INR & PT/INR IN PRE-OP. REMOVING POLYPS MAY BE HIGH RISK I INR > 1.5. NEED TO IDENTIFY SOURCE FOR BRBPR.

## 2011-09-23 NOTE — Assessment & Plan Note (Signed)
PT CLINICALLY IMPROVED. ONLY ON ALDACTONE AT 200 MG QD. LOST 55 LBS SINCE MAY 2013. LAST ETOH 4-6 WEEKS AGO.   STOP THE ALDACTONE AND POTASSIUM.  TAPER OFF PREDNISONE. START TAKING 30 MG DAILY FOR 5 DAYS THEN 2 DAILY FOR 5 DAYS THEN 1 DAILY FOR 5 DAYS THEN STOP.  START A PROBIOTIC DAILY. ALIGN SAMPLES GIVEN.  GET LABS DRAWN. RE-CALCULATE MELD/CHILD PUGH.  UPPER AND LOWER ENDOSCOPY IN 2-3 WEEKS WITH PROPOFOL DUE TO CHRONIC ETOH USE.  FOLLOW UP IN AUG 2013. GET TWINRIX IN AUG 2013.

## 2011-09-23 NOTE — Progress Notes (Signed)
Faxed to PCP

## 2011-09-23 NOTE — Progress Notes (Signed)
Subjective:    Patient ID: Deanna Thompson, female    DOB: 1955/12/11, 56 y.o.   MRN: [id number]  PCP: FUSCO  HPI RECORDING WEIGHT-263 LBS JUN 8 NOW 218 LBS TODAY. Making it on A REG DIET. C/O MILD ABD PAIN/TTP. NO MORE BIL LE SWELLING. ABD STILL PROMINENT BUT BETTER. SLEEPING LESS THAN USED TO AT NIGHT.  NAPS DURING THE DAY. NO PROBLEMS SWALLOWING. NO MORE NAUSEA OR VOMITING. HAVING SOFT CONNECTED STOOL. BmS: 3-4/DAY. SAW BLOOD IN STOOL: BRB-MINOR CRAMPING GETS BETTER AFTER BMs. WAS TOLD NOT TO DRIVE. FEELS SHE WILL BE READY IN A FEW WEEKS. READY FOR SOME OF PREDNISONE TO BE WEANED OFF. STOPPED BUMEX 6/15. NOT ON LASIX DUE TO ?DIARRHEA. No Etoh for 4-6 weeks.  Past Medical History  Diagnosis Date  . Brain aneurysm   . IBS (irritable bowel syndrome)   . GERD (gastroesophageal reflux disease)   . Panic attack   . Anxiety   . HTN (hypertension)   . Psoriatic arthritis   . ETOH abuse     Quit in 07/2011   . Sjogren's disease   . Cirrhosis     Presumably alcoholic cirrhosis    Past Surgical History  Procedure Date  . Cerebral aneurysm repair    Allergies  Allergen Reactions  . Omeprazole Shortness Of Breath  . Phenytoin Rash  . Cephalexin Other (See Comments)    Severe yeast infection    Current Outpatient Prescriptions  Medication Sig Dispense Refill  . escitalopram (LEXAPRO) 10 MG tablet Take 10 mg by mouth daily.        Marland Kitchen esomeprazole (NEXIUM) 40 MG capsule Take 40 mg by mouth daily before breakfast.      . folic acid (FOLVITE) 1 MG tablet Take 1 tablet (1 mg total) by mouth daily.    . NON FORMULARY Gas-x gel    One tablet twice daily    . Olopatadine HCl (PATANASE NA) Place 1 spray into the nose 2 (two) times daily.    . potassium chloride 20 MEQ/15ML (10%) solution Take 30 mLs (40 mEq total) by mouth daily.    Marland Kitchen spironolactone (ALDACTONE) 100 MG tablet Take 1 tablet (100 mg total) by mouth 2 (two) times daily.    . Tetrahydrozoline HCl (VISINE OP) Apply 2 drops to  eye 2 (two) times daily.    Marland Kitchen PREDNISONE  40 MG DAILY    . ondansetron (ZOFRAN) 4 MG tablet Take 1 tablet (4 mg total) by mouth every 6 (six) hours as needed for nausea.        Review of Systems JUN 15: CR 0.96 K 3.2 AST 81 ALT 75 ALB 2.9 HB 14.7 MCV 104.2 PLT 123 INR 1.99 AMMONIA 30    Objective:   Physical Exam  Vitals reviewed. Constitutional: She is oriented to person, place, and time. She appears well-developed. No distress.  HENT:  Head: Normocephalic and atraumatic.  Mouth/Throat: Oropharynx is clear and moist. No oropharyngeal exudate.  Eyes: Pupils are equal, round, and reactive to light. Scleral icterus is present.  Neck: Normal range of motion. Neck supple.  Cardiovascular: Normal rate, regular rhythm and normal heart sounds.   Pulmonary/Chest: Effort normal and breath sounds normal. No respiratory distress.  Abdominal: Soft. Bowel sounds are normal. She exhibits distension (MILD). There is tenderness (MILD TTP IN LUQ). There is no rebound and no guarding.  Musculoskeletal: Normal range of motion. She exhibits no edema.  Lymphadenopathy:    She has no cervical adenopathy.  Neurological: She  is alert and oriented to person, place, and time.       NO FOCAL DEFICITS   Psychiatric: She has a normal mood and affect.          Assessment & Plan:

## 2011-09-23 NOTE — Assessment & Plan Note (Signed)
CLINICALLY IMPROVED.  TAPER PREDNISONE. REPEAT LABS TODAY.

## 2011-09-24 NOTE — Progress Notes (Signed)
Reminder in epic to follow up with SF in E30 

## 2011-09-28 ENCOUNTER — Encounter (HOSPITAL_COMMUNITY): Payer: Self-pay | Admitting: Pharmacy Technician

## 2011-09-29 ENCOUNTER — Telehealth: Payer: Self-pay

## 2011-09-29 NOTE — Telephone Encounter (Signed)
Pt called and said that Dr. Darrick Penna stopped her aldactone when she was in for the office visit on 09/23/2011. She has gained from 217 lbs to this Am she weighed 222 lbs. Dr. Darrick Penna is on vacation this week. She said that we might want to let Dr. Sherwood Gambler know, and I asked the pt to call Dr. Sherwood Gambler and inform him since Dr. Darrick Penna is not in. I told her I would inform extender.

## 2011-09-30 ENCOUNTER — Encounter (HOSPITAL_COMMUNITY): Admission: RE | Admit: 2011-09-30 | Payer: BC Managed Care – PPO | Source: Ambulatory Visit

## 2011-09-30 NOTE — Telephone Encounter (Signed)
Please touch base with patient today to see if PCP made any changes ie adding back diuretics. Please also find out if patient has pitting edema in lower extremities. Thanks.

## 2011-09-30 NOTE — Telephone Encounter (Signed)
Called pt. She did call PCP yesterday and was started back on Aldactone 100 mg daily. She is to see PCP on 7/18 or 7/19. She said she absolutely does not have any pitting edema. She was just told that when the weight started creeping back up to inform her physicians. She is still at 222 lbs this AM. She said she has noticed that when her weight starts creeping up that her BP and Pulse start creeping up also. She said yesterday BP was 132/78 and today it is 147/84. Today her pulse is 94. But she has just started back on the Aldactone. Said to let you know she is weaning down on the Prednisone.

## 2011-09-30 NOTE — Telephone Encounter (Signed)
Let's save for SLF review. Glad to hear no pitting edema.

## 2011-10-02 ENCOUNTER — Telehealth: Payer: Self-pay | Admitting: Gastroenterology

## 2011-10-02 NOTE — Telephone Encounter (Signed)
Weight up 8 pounds. Reportedly on aldactone 100mg  daily for past few days.   If she is having orthostasis, I'm afraid to increase her diuretics.   I think she needs to be seen by her PCP today. Otherwise, she may need to go to ED.

## 2011-10-02 NOTE — Telephone Encounter (Signed)
Pt's husband Primitivo Gauze) called in regards to his wife. She is gaining rapid fluid weight. Blood pressure was elevated while seated and dropped while standing. Recently in ICU cardiac unit. Husband is tearful and very concern, Please call patient and/or husband at [phone] or [phone]

## 2011-10-02 NOTE — Telephone Encounter (Signed)
Called and spoke to pt's husband. He said this AM pt is at 228.4 lbs. Swelling in her abdomen. Said she does not have edema in extremities. They are concerned because the nurse that was out yesterday took BP and it was 140 / 80's and when she stood it went down to 70/50. He said it does that some with him taking it . This morning her BP was 150/94 and pulse is 99. It dropped some but did not stay.

## 2011-10-02 NOTE — Telephone Encounter (Signed)
Called and informed pts husband

## 2011-10-02 NOTE — Telephone Encounter (Signed)
Correction, he only checked it yesterday sitting and standing. Today at the 150/94 reading, he did not have her stand. He was concerned to day about the wt gain. Please advise!

## 2011-10-05 ENCOUNTER — Other Ambulatory Visit (HOSPITAL_COMMUNITY): Payer: BC Managed Care – PPO

## 2011-10-05 ENCOUNTER — Telehealth: Payer: Self-pay | Admitting: Gastroenterology

## 2011-10-05 NOTE — Telephone Encounter (Signed)
CALLED PT TO DISCUSS LAB RESULTS. PT CONCERNED ABOUT GAINING WEIGHT 217-227 LBS OVER 3-4 WEEKS-227 LBS TODAY. LIVER ENZYMES/INR  IMPROVED. 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 TAKING ALDACTONE 100 MG DAILY. DRINKING 10 16 OZ CUPS OF WATER DAILY. PT DOES NEED A LIVER TRANSPLANT EVALUATION, BUT NOT IMMEDIATELY. SHE SHOULD CONTINUE WITH ETOH CESSATION. LIMIT WATER TO (4) 16 OZ CUPS DAILY. PT WILL CALL THUR WITH HER WEIGHT. MAY NEED TO ADD BUMEX. PT HAD INTOLERANCE TO LASIX. WOULD LIKE FOR ME TO SPEAK WITH HER HUSBAND ABOUT HER PROGNOSIS WHEN SHE COMES FOR HER PROCEDURE.   PT NEEDS OPV IN MID AUG W/ SLF E 30. PT NEEDS REFERRAL TO [facility] LIVER  TRANSPLANT SERVICE, DX: ETOH CIRRHOSIS, MELD 15, CHILD PUGH B. SCHEDULE APPT IN LATE AUG/EARLY SEP.

## 2011-10-06 NOTE — Telephone Encounter (Signed)
Referral to Dr. Timothy Lasso has been made and they will let me know what date and time patient will be seen

## 2011-10-06 NOTE — Telephone Encounter (Signed)
Reminder in epic to follow up with SF in E30 in August 2013 °

## 2011-10-07 ENCOUNTER — Ambulatory Visit: Payer: BC Managed Care – PPO | Admitting: Gastroenterology

## 2011-10-07 ENCOUNTER — Telehealth: Payer: Self-pay | Admitting: Gastroenterology

## 2011-10-07 NOTE — Telephone Encounter (Signed)
Patient states that her folic acid is not being called to [facility] and she needs her medication please advise?

## 2011-10-07 NOTE — Telephone Encounter (Signed)
CALL IN FOLIC ACID 1 MG QD, #31, [id number].

## 2011-10-07 NOTE — Discharge Summary (Signed)
DISCHARGE SUMMARY  Deanna Thompson  MR#: [id number]  DOB:[birth year]  Date of Admission: 08/25/2011 Date of Discharge: 10/07/2011  Attending Physician:Jewell Ryans  Patient's HYQ:MVHQI,ONGEXBMW J., MD  Consults:  Palliative care tream  Discharge Diagnoses: Principal Problem:  *Bradycardia with presyncope  Active Problems:  Cirrhosis  Hypotension  Acute renal failure  Delirium  Ascites  Pedal edema  Leukocytosis  Coagulopathy Klebsiella UTI Sjogren's disease Colon thickening noted on CT abdomen    Discharge Medications: Medication List  As of 10/07/2011 11:06 AM     TAKE these medications    diphenoxylate-atropine 2.5-0.025 MG per tablet      furosemide 20 MG tablet      metoprolol succinate 100 MG 24 hr tablet      spironolactone 50 MG tablet      verapamil 240 MG CR tablet         escitalopram 10 MG tablet   Commonly known as: LEXAPRO   Take 10 mg by mouth daily.      folic acid 1 MG tablet   Commonly known as: FOLVITE   Take 1 tablet (1 mg total) by mouth daily.      ondansetron 4 MG tablet   Commonly known as: ZOFRAN   Take 1 tablet (4 mg total) by mouth every 6 (six) hours as needed for nausea.      PATANASE NA   Place 1 spray into the nose 2 (two) times daily.      VISINE OP   Apply 2 drops to eye 2 (two) times daily.             Procedures: Transjugular Liver Biopsy- Interventional Radiology- Dr Berdine Dance- 08/28/11   Hospital Course: 55yoF with h/o Sjogren's disease, psoriatic arthritis, alcohol abuse, and diagnosed this month with cirrhosis who comes in with presyncope and found to have bradycardia, hypotension, and new ARF.  Pt was recently sent to GI for further evaluation of bowel changes, abdominal  pain, severe nonbloody diarrhea. She was found to have increased LFT's thought  due to alcohol abuse. She was found on ultrasound to have cirrhotic liver, and  difficulty demonstrating blood flow in main portal vein, ? portal  vein  thrombosis vs cavernous formation. She was then sent for CT abdomen pelvis which  showed cirrhosis but no PVT or neoplasia; moderate ascites, no splenomegaly,  right colonic thickening with DDx of right colitis vs hypoalbuminemia. Dr.  Darrick Penna planned EGD/colonoscopy.  Pt states she stopped drinking 3 weeks ago, no withdrawal signs. She states 1 wk ago was started on lasix and spironolactone for LE edema and abd distention and is aware of new cirrhosis diagnosis. Then earlier tonight she felt very dizzy, weak, and felt that she was going to pass out, also wtih SOB but no substernal CP or angina. EMS arrived and found her HR in the 20's.  In the ED, pt with persistent bradycardia in the 20's for quite a long time but  apparently moderately stable appearing. She then spontaneously improved to the  50-60's. Initially hypotense as well but this has improved with IVF's. Pt was given 1mg  glucagon, 1g Ca gluconate, and 325 ASA. Cardiology was called, but pt's HR had improved by that time.   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  Pt underwent TJLBX 5/31 at the suggestion of her outpt GI/hepatology MD - path reveals cirrhosis which is probably related to ETOH - Dr Darrick Penna recommends to continue the original plan (28 days of pred 40mg  QD  with slow taper to follow) - Start date 6/1  patient is tolerating well - Dr Darrick Penna has notified me that she has a f/u appt on June 26th at 8:30AM   coagulopahty  Secondary to cirrhosis Dosed w/ vitamin K without benefit (both sub Q as well as IV) - hemoglobin has been stable after liver biopsy    Bradycardia w/ presyncope  Resolved - felt to be due to retention of meds due to ARF + diminished metabolism due to liver disease   Folate deficiency w/ macrocytosis  Replacement continues (first via IV, and now oral)   Hypotension w/ presyncope with history of HTN  due to diurectic induced DH - resolved w/ volume expansion  BP quite high-  resumed Verapamil and Metoprolol   Ascites/edema  Due to cirrhosis - improved with diuresis  Acute renal failure  Resolved w/ volume resuscitation -   delirium  appears to be at baseline mental status at this time   Intractable vomiting and diarrhea  Recurrent - held lomotil, levaquin and lactulose. Improved slightly with these measures  D/c ensure as that is also contributing to her nausea.   Tympanic distended abdomen  Started Gas x  cont to ambulate   UTI- Klebsiella  Sensitive to Levaquin- has received 4 days- will stop- no recent history of frequent UTIs or bladder surgery and since she was admitted with this, it is not a complicated UTI.   Colon thickening via CT abdom  Dr Darrick Penna had mentioned in a her notes that, due to coagulopathy, colonoscopy is not an option   Sjogren's   Diet  Pt states Dr Darrick Penna has advised a high protein diet for her and recommends boost or ensure. She is not tolerating ensure. Try boost    Day of Discharge Physical Exam: BP 118/60  Pulse 54  Temp 98.4 F (36.9 C) (Oral)  Resp 19  Ht 5\' 8"  (1.727 m)  Wt 120.2 kg (264 lb 15.9 oz)  BMI 40.29 kg/m2  SpO2 100% General: No acute respiratory distress  Lungs: Clear to auscultation bilaterally without wheezes except for mild bibasilar crackles  Cardiovascular: Regular rate and rhythm without murmur gallop or rub normal S1 and S2  Abdomen: Nontender, markedly protuberent, tympanic,  soft, bowel sounds positive  Extremities: No significant cyanosis or clubbing; Edema  2+     Disposition: stable for d/c   Follow-up Appts: Discharge Orders    Future Appointments: Provider: Department: Dept Phone: Center:   10/08/2011 11:00 AM [facility] Ap-M[facility]urg Day  None     Future Orders Please Complete By Expires   Diet - low sodium heart healthy      Increase activity slowly      Discharge instructions      Comments:   Continue a chart of daily weights for your doctors      Follow-up with  Dr.Sandi Fields- has appt for June 26th  Time on Discharge: >45 min  Signed: Shanigua Gibb 10/07/2011, 11:06 AM

## 2011-10-07 NOTE — Telephone Encounter (Signed)
Called pt. She said she was given the folic acid 1 mg daily by hospitalist. It was her understanding that Dr. Darrick Penna will do the refills on this. Please advise!

## 2011-10-07 NOTE — Telephone Encounter (Signed)
Rx called to [facility]. Pt is aware.

## 2011-10-08 ENCOUNTER — Encounter (HOSPITAL_COMMUNITY): Payer: Self-pay

## 2011-10-08 ENCOUNTER — Telehealth: Payer: Self-pay | Admitting: Gastroenterology

## 2011-10-08 ENCOUNTER — Encounter (HOSPITAL_COMMUNITY)
Admission: RE | Admit: 2011-10-08 | Discharge: 2011-10-08 | Disposition: A | Discharge status: Home / Self Care | Payer: BC Managed Care – PPO | Source: Ambulatory Visit | Attending: Gastroenterology | Admitting: Gastroenterology

## 2011-10-08 HISTORY — DX: Other complications of anesthesia, initial encounter: T88.59XA

## 2011-10-08 HISTORY — DX: Other specified postprocedural states: Z98.890

## 2011-10-08 HISTORY — DX: Adverse effect of unspecified anesthetic, initial encounter: T41.45XA

## 2011-10-08 HISTORY — DX: Other specified postprocedural states: R11.2

## 2011-10-08 LAB — BASIC METABOLIC PANEL
BUN: 9 mg/dL (ref 6–23)
Calcium: 8.9 mg/dL (ref 8.4–10.5)
Chloride: 98 mEq/L (ref 96–112)
Creatinine, Ser: 0.7 mg/dL (ref 0.50–1.10)
GFR calc Af Amer: >90 mL/min (ref >90)

## 2011-10-08 LAB — CBC
HCT: 38.1 % (ref 36.0–46.0)
MCH: 37.3 pg — ABNORMAL HIGH (ref 26.0–34.0)
MCHC: 35.2 g/dL (ref 30.0–36.0)
MCV: 106.1 fL — ABNORMAL HIGH (ref 78.0–100.0)
RDW: 17.2 % — ABNORMAL HIGH (ref 11.5–15.5)

## 2011-10-08 NOTE — Patient Instructions (Addendum)
Your procedure is scheduled on:  10/13/2011  Report to [facility] at   8:00   AM.  Call this number if you have problems the morning of surgery: [phone]   Remember:   Do not eat or drink :After Midnight.    Take these medicines the morning of surgery with A SIP OF WATER: nexium   Colonoscopy Care After Read the instructions outlined below and refer to this sheet in the next few weeks. These discharge instructions provide you with general information on caring for yourself after you leave the hospital. Your doctor may also give you specific instructions. While your treatment has been planned according to the most current medical practices available, unavoidable complications occasionally occur. If you have any problems or questions after discharge, call your doctor. HOME CARE INSTRUCTIONS ACTIVITY:  You may resume your regular activity, but move at a slower pace for the next 24 hours.   Take frequent rest periods for the next 24 hours.   Walking will help get rid of the air and reduce the bloated feeling in your belly (abdomen).   No driving for 24 hours (because of the medicine (anesthesia) used during the test).   You may shower.   Do not sign any important legal documents or operate any machinery for 24 hours (because of the anesthesia used during the test).  NUTRITION:  Drink plenty of fluids.   You may resume your normal diet as instructed by your doctor.   Begin with a light meal and progress to your normal diet. Heavy or fried foods are harder to digest and may make you feel sick to your stomach (nauseated).   Avoid alcoholic beverages for 24 hours or as instructed.  MEDICATIONS:  You may resume your normal medications unless your doctor tells you otherwise.  WHAT TO EXPECT TODAY:  Some feelings of bloating in the abdomen.   Passage of more gas than usual.   Spotting of blood in your stool or on the toilet paper.  IF YOU HAD POLYPS REMOVED DURING THE  COLONOSCOPY:  No aspirin products for 7 days or as instructed.   No alcohol for 7 days or as instructed.   Eat a soft diet for the next 24 hours.  FINDING OUT THE RESULTS OF YOUR TEST Not all test results are available during your visit. If your test results are not back during the visit, make an appointment with your caregiver to find out the results. Do not assume everything is normal if you have not heard from your caregiver or the medical facility. It is important for you to follow up on all of your test results.  SEEK IMMEDIATE MEDICAL CARE IF:  You have more than a spotting of blood in your stool.   Your belly is swollen (abdominal distention).   You are nauseated or vomiting.   You have a fever.   You have abdominal pain or discomfort that is severe or gets worse throughout the day.  Document Released: 10/29/2003 Document Revised: 03/05/2011 Document Reviewed: 10/27/2007 [facility] Patient Information 2012 [place], [place].Endoscopy Care After Please read the instructions outlined below and refer to this sheet in the next few weeks. These discharge instructions provide you with general information on caring for yourself after you leave the hospital. Your doctor may also give you specific instructions. While your treatment has been planned according to the most current medical practices available, unavoidable complications occasionally occur. If you have any problems or questions after discharge, please call your  doctor. HOME CARE INSTRUCTIONS Activity  You may resume your regular activity but move at a slower pace for the next 24 hours.   Take frequent rest periods for the next 24 hours.   Walking will help expel (get rid of) the air and reduce the bloated feeling in your abdomen.   No driving for 24 hours (because of the anesthesia (medicine) used during the test).   You may shower.   Do not sign any important legal documents or operate any machinery for 24 hours (because of  the anesthesia used during the test).  Nutrition  Drink plenty of fluids.   You may resume your normal diet.   Begin with a light meal and progress to your normal diet.   Avoid alcoholic beverages for 24 hours or as instructed by your caregiver.  Medications You may resume your normal medications unless your caregiver tells you otherwise. What you can expect today  You may experience abdominal discomfort such as a feeling of fullness or "gas" pains.   You may experience a sore throat for 2 to 3 days. This is normal. Gargling with salt water may help this.  Follow-up Your doctor will discuss the results of your test with you. SEEK IMMEDIATE MEDICAL CARE IF:  You have excessive nausea (feeling sick to your stomach) and/or vomiting.   You have severe abdominal pain and distention (swelling).   You have trouble swallowing.   You have a temperature over 100 F (37.8 C).   You have rectal bleeding or vomiting of blood.  Document Released: 10/29/2003 Document Revised: 03/05/2011 Document Reviewed: 05/11/2007 [facility] Patient Information 2012 [place], [place].   Do not wear jewelry, make-up or nail polish.  Do not wear lotions, powders, or perfumes. You may wear deodorant.  Do not shave 48 hours prior to surgery.  Do not bring valuables to the hospital.  Contacts, dentures or bridgework may not be worn into surgery.  Patients discharged the day of surgery will not be allowed to drive home.  Name and phone number of your driver:    Please read over the following fact sheets that you were given: Pain Booklet, Surgical Site Infection Prevention, Anesthesia Post-op Instructions and Care and Recovery After Surgery

## 2011-10-08 NOTE — Telephone Encounter (Signed)
PT NEEDS REPEAT CBC/PT/INR ON DAY OF TCS/EGD.

## 2011-10-13 ENCOUNTER — Ambulatory Visit (HOSPITAL_COMMUNITY)
Admission: RE | Admit: 2011-10-13 | Discharge: 2011-10-13 | Disposition: A | Discharge status: Home / Self Care | Payer: BC Managed Care – PPO | Source: Ambulatory Visit | Attending: Gastroenterology | Admitting: Gastroenterology

## 2011-10-13 ENCOUNTER — Encounter (HOSPITAL_COMMUNITY): Payer: Self-pay | Admitting: Anesthesiology

## 2011-10-13 ENCOUNTER — Other Ambulatory Visit: Payer: Self-pay

## 2011-10-13 ENCOUNTER — Encounter (HOSPITAL_COMMUNITY)
Admission: RE | Disposition: A | Discharge status: Home / Self Care | Payer: Self-pay | Source: Ambulatory Visit | Attending: Gastroenterology

## 2011-10-13 ENCOUNTER — Encounter (HOSPITAL_COMMUNITY): Payer: Self-pay | Admitting: *Deleted

## 2011-10-13 ENCOUNTER — Ambulatory Visit (HOSPITAL_COMMUNITY): Payer: BC Managed Care – PPO | Admitting: Anesthesiology

## 2011-10-13 DIAGNOSIS — D1330 Benign neoplasm of unspecified part of small intestine: Secondary | ICD-10-CM

## 2011-10-13 DIAGNOSIS — K625 Hemorrhage of anus and rectum: Secondary | ICD-10-CM

## 2011-10-13 DIAGNOSIS — K921 Melena: Secondary | ICD-10-CM | POA: Insufficient documentation

## 2011-10-13 DIAGNOSIS — K7460 Unspecified cirrhosis of liver: Secondary | ICD-10-CM

## 2011-10-13 DIAGNOSIS — D126 Benign neoplasm of colon, unspecified: Secondary | ICD-10-CM

## 2011-10-13 DIAGNOSIS — R112 Nausea with vomiting, unspecified: Secondary | ICD-10-CM | POA: Insufficient documentation

## 2011-10-13 DIAGNOSIS — K2970 Gastritis, unspecified, without bleeding: Secondary | ICD-10-CM

## 2011-10-13 DIAGNOSIS — I8500 Esophageal varices without bleeding: Secondary | ICD-10-CM

## 2011-10-13 DIAGNOSIS — K319 Disease of stomach and duodenum, unspecified: Secondary | ICD-10-CM | POA: Insufficient documentation

## 2011-10-13 DIAGNOSIS — Z01812 Encounter for preprocedural laboratory examination: Secondary | ICD-10-CM | POA: Insufficient documentation

## 2011-10-13 DIAGNOSIS — K648 Other hemorrhoids: Secondary | ICD-10-CM | POA: Insufficient documentation

## 2011-10-13 DIAGNOSIS — R197 Diarrhea, unspecified: Secondary | ICD-10-CM | POA: Insufficient documentation

## 2011-10-13 DIAGNOSIS — K2980 Duodenitis without bleeding: Secondary | ICD-10-CM | POA: Insufficient documentation

## 2011-10-13 DIAGNOSIS — K633 Ulcer of intestine: Secondary | ICD-10-CM

## 2011-10-13 DIAGNOSIS — K2990 Gastroduodenitis, unspecified, without bleeding: Secondary | ICD-10-CM

## 2011-10-13 DIAGNOSIS — I10 Essential (primary) hypertension: Secondary | ICD-10-CM | POA: Insufficient documentation

## 2011-10-13 HISTORY — PX: BIOPSY: SHX5522

## 2011-10-13 HISTORY — PX: COLONOSCOPY: SHX174

## 2011-10-13 LAB — CBC
HCT: 41.1 % (ref 36.0–46.0)
Hemoglobin: 14.6 g/dL (ref 12.0–15.0)
MCH: 37.1 pg — ABNORMAL HIGH (ref 26.0–34.0)
MCHC: 35.5 g/dL (ref 30.0–36.0)
MCV: 104.3 fL — ABNORMAL HIGH (ref 78.0–100.0)

## 2011-10-13 SURGERY — COLONOSCOPY WITH PROPOFOL
Anesthesia: Monitor Anesthesia Care

## 2011-10-13 MED ORDER — MIDAZOLAM HCL 2 MG/2ML IJ SOLN
1.0000 mg | INTRAMUSCULAR | Status: DC | PRN
  Administered 2011-10-13: 2 mg via INTRAVENOUS

## 2011-10-13 MED ORDER — RIFAXIMIN 550 MG PO TABS: 550.0000 mg | ORAL_TABLET | Freq: Two times a day (BID) | ORAL | Status: DC

## 2011-10-13 MED ORDER — MIDAZOLAM HCL 2 MG/2ML IJ SOLN
INTRAMUSCULAR | Status: AC
  Filled 2011-10-13: qty 2

## 2011-10-13 MED ORDER — SIMETHICONE 40 MG/0.6ML PO SUSP
ORAL | Status: DC | PRN
  Administered 2011-10-13: 10:00:00

## 2011-10-13 MED ORDER — SCOPOLAMINE 1 MG/3DAYS TD PT72
MEDICATED_PATCH | TRANSDERMAL | Status: AC
  Filled 2011-10-13: qty 1

## 2011-10-13 MED ORDER — PROPOFOL 10 MG/ML IV EMUL
INTRAVENOUS | Status: AC
  Filled 2011-10-13: qty 40

## 2011-10-13 MED ORDER — DEXAMETHASONE SODIUM PHOSPHATE 4 MG/ML IJ SOLN
INTRAMUSCULAR | Status: AC
  Filled 2011-10-13: qty 1

## 2011-10-13 MED ORDER — FENTANYL CITRATE 0.05 MG/ML IJ SOLN: 25.0000 ug | INTRAMUSCULAR | Status: DC | PRN

## 2011-10-13 MED ORDER — FENTANYL CITRATE 0.05 MG/ML IJ SOLN
INTRAMUSCULAR | Status: DC | PRN
  Administered 2011-10-13 (×2): 50 ug via INTRAVENOUS
  Administered 2011-10-13 (×4): 25 ug via INTRAVENOUS

## 2011-10-13 MED ORDER — FENTANYL CITRATE 0.05 MG/ML IJ SOLN
INTRAMUSCULAR | Status: AC
  Filled 2011-10-13: qty 2

## 2011-10-13 MED ORDER — LIDOCAINE HCL (PF) 1 % IJ SOLN
INTRAMUSCULAR | Status: AC
  Filled 2011-10-13: qty 5

## 2011-10-13 MED ORDER — BUMETANIDE 1 MG PO TABS: ORAL_TABLET | ORAL | Status: DC

## 2011-10-13 MED ORDER — PROPOFOL 10 MG/ML IV EMUL
INTRAVENOUS | Status: AC
  Filled 2011-10-13: qty 20

## 2011-10-13 MED ORDER — ONDANSETRON HCL 4 MG/2ML IJ SOLN: 4.0000 mg | Freq: Once | INTRAMUSCULAR | Status: DC | PRN

## 2011-10-13 MED ORDER — PROPOFOL 10 MG/ML IV EMUL
INTRAVENOUS | Status: DC | PRN
  Administered 2011-10-13: 75 ug/kg/min via INTRAVENOUS

## 2011-10-13 MED ORDER — SPIRONOLACTONE 100 MG PO TABS: ORAL_TABLET | ORAL | Status: DC

## 2011-10-13 MED ORDER — ONDANSETRON HCL 4 MG/2ML IJ SOLN
4.0000 mg | Freq: Once | INTRAMUSCULAR | Status: AC
  Administered 2011-10-13: 4 mg via INTRAVENOUS

## 2011-10-13 MED ORDER — DEXAMETHASONE SODIUM PHOSPHATE 4 MG/ML IJ SOLN
4.0000 mg | Freq: Once | INTRAMUSCULAR | Status: AC
  Administered 2011-10-13: 4 mg via INTRAVENOUS

## 2011-10-13 MED ORDER — LACTATED RINGERS IV SOLN
INTRAVENOUS | Status: DC
  Administered 2011-10-13: 10:00:00 via INTRAVENOUS

## 2011-10-13 MED ORDER — LACTATED RINGERS IV SOLN
INTRAVENOUS | Status: DC | PRN
  Administered 2011-10-13: 09:00:00 via INTRAVENOUS

## 2011-10-13 MED ORDER — LIDOCAINE HCL (CARDIAC) 10 MG/ML IV SOLN
INTRAVENOUS | Status: DC | PRN
  Administered 2011-10-13: 50 mg via INTRAVENOUS

## 2011-10-13 MED ORDER — SCOPOLAMINE 1 MG/3DAYS TD PT72
1.0000 | MEDICATED_PATCH | Freq: Once | TRANSDERMAL | Status: DC
  Administered 2011-10-13: 1.5 mg via TRANSDERMAL

## 2011-10-13 MED ORDER — MIDAZOLAM HCL 5 MG/5ML IJ SOLN
INTRAMUSCULAR | Status: DC | PRN
  Administered 2011-10-13 (×2): 1 mg via INTRAVENOUS

## 2011-10-13 MED ORDER — GLYCOPYRROLATE 0.2 MG/ML IJ SOLN
0.2000 mg | Freq: Once | INTRAMUSCULAR | Status: AC
  Administered 2011-10-13: 0.2 mg via INTRAVENOUS

## 2011-10-13 MED ORDER — WATER FOR IRRIGATION, STERILE IR SOLN
Status: DC | PRN
  Administered 2011-10-13: 1000 mL

## 2011-10-13 MED ORDER — GLYCOPYRROLATE 0.2 MG/ML IJ SOLN
INTRAMUSCULAR | Status: AC
  Filled 2011-10-13: qty 1

## 2011-10-13 MED ORDER — ONDANSETRON HCL 4 MG/2ML IJ SOLN
INTRAMUSCULAR | Status: AC
  Filled 2011-10-13: qty 2

## 2011-10-13 SURGICAL SUPPLY — 23 items
BLOCK BITE 60FR ADLT L/F BLUE (MISCELLANEOUS) ×1 IMPLANT
ELECT REM PT RETURN 9FT ADLT (ELECTROSURGICAL)
ELECTRODE REM PT RTRN 9FT ADLT (ELECTROSURGICAL) IMPLANT
FCP BXJMBJMB 240X2.8X (CUTTING FORCEPS)
FLOOR PAD 36X40 (MISCELLANEOUS) ×2
FORCEPS BIOP RAD 4 LRG CAP 4 (CUTTING FORCEPS) ×2 IMPLANT
FORCEPS BIOP RJ4 240 W/NDL (CUTTING FORCEPS)
FORCEPS BXJMBJMB 240X2.8X (CUTTING FORCEPS) IMPLANT
INJECTOR/SNARE I SNARE (MISCELLANEOUS) IMPLANT
LUBRICANT JELLY 4.5OZ STERILE (MISCELLANEOUS) ×1 IMPLANT
MANIFOLD NEPTUNE II (INSTRUMENTS) ×2 IMPLANT
NDL SCLEROTHERAPY 25GX240 (NEEDLE) IMPLANT
NEEDLE SCLEROTHERAPY 25GX240 (NEEDLE) IMPLANT
PAD FLOOR 36X40 (MISCELLANEOUS) ×1 IMPLANT
PROBE APC STR FIRE (PROBE) IMPLANT
PROBE INJECTION GOLD (MISCELLANEOUS) ×4
PROBE INJECTION GOLD 7FR (MISCELLANEOUS) IMPLANT
SNARE SHORT THROW 13M SML OVAL (MISCELLANEOUS) ×2 IMPLANT
SYR 50ML LL SCALE MARK (SYRINGE) ×1 IMPLANT
TRAP SPECIMEN MUCOUS 40CC (MISCELLANEOUS) IMPLANT
TUBING ENDO SMARTCAP PENTAX (MISCELLANEOUS) ×2 IMPLANT
TUBING IRRIGATION ENDOGATOR (MISCELLANEOUS) IMPLANT
WATER STERILE IRR 1000ML POUR (IV SOLUTION) ×1 IMPLANT

## 2011-10-13 NOTE — Anesthesia Preprocedure Evaluation (Signed)
Anesthesia Evaluation  Patient identified by MRN, date of birth, ID band Patient awake    Reviewed: Allergy & Precautions, H&P , NPO status , Patient's Chart, lab work & pertinent test results  History of Anesthesia Complications (+) PONV  Airway Mallampati: II      Dental  (+) Teeth Intact and Implants   Pulmonary former smoker,  breath sounds clear to auscultation        Cardiovascular hypertension, Pt. on medications Rhythm:Regular Rate:Normal     Neuro/Psych PSYCHIATRIC DISORDERS Anxiety Hx cerebral aneurysm, clipped 2006.    GI/Hepatic GERD-  ,(+) Cirrhosis -       , Hepatitis -  Endo/Other    Renal/GU      Musculoskeletal   Abdominal   Peds  Hematology   Anesthesia Other Findings   Reproductive/Obstetrics                           Anesthesia Physical Anesthesia Plan  ASA: III  Anesthesia Plan: MAC   Post-op Pain Management:    Induction: Intravenous  Airway Management Planned: Simple Face Mask  Additional Equipment:   Intra-op Plan:   Post-operative Plan: Extubation in OR  Informed Consent: I have reviewed the patients History and Physical, chart, labs and discussed the procedure including the risks, benefits and alternatives for the proposed anesthesia with the patient or authorized representative who has indicated his/her understanding and acceptance.     Plan Discussed with:   Anesthesia Plan Comments:         Anesthesia Quick Evaluation

## 2011-10-13 NOTE — Interval H&P Note (Signed)
History and Physical Interval Note:  10/13/2011 9:09 AM  Deanna Thompson  has presented today for surgery, with the diagnosis of Cirrhosis, rectal bleeding  The various methods of treatment have been discussed with the patient and family. After consideration of risks, benefits and other options for treatment, the patient has consented to  Procedure(s) (LRB): COLONOSCOPY WITH PROPOFOL (N/A) ESOPHAGOGASTRODUODENOSCOPY (EGD) WITH PROPOFOL (N/A) as a surgical intervention .  The patient's history has been reviewed, patient examined, no change in status, stable for surgery.  I have reviewed the patients' chart and labs.  Questions were answered to the patient's satisfaction.     [employer]

## 2011-10-13 NOTE — OR Nursing (Signed)
Took trilyte prep,  Results clear

## 2011-10-13 NOTE — Anesthesia Postprocedure Evaluation (Signed)
  Anesthesia Post-op Note  Patient: Deanna Thompson  Procedure(s) Performed: Procedure(s) (LRB): COLONOSCOPY WITH PROPOFOL (N/A) ESOPHAGOGASTRODUODENOSCOPY (EGD) WITH PROPOFOL (N/A) BIOPSY (N/A)  Patient Location: PACU  Anesthesia Type: MAC  Level of Consciousness: awake, alert , oriented and patient cooperative  Airway and Oxygen Therapy: Patient Spontanous Breathing  Post-op Pain: mild  Post-op Assessment: Post-op Vital signs reviewed, Patient's Cardiovascular Status Stable, Respiratory Function Stable and Patent Airway  Post-op Vital Signs: Reviewed and stable  Complications: No apparent anesthesia complications

## 2011-10-13 NOTE — Op Note (Signed)
[facility] [address] [place], [place]  [address]  COLONOSCOPY PROCEDURE REPORT  PATIENT:  Deanna Thompson, Deanna Thompson  MR#:  [id number] BIRTHDATE:  1955-04-07, 55 yrs. old  GENDER:  female  ENDOSCOPIST:  Jonette Eva, MD REF. BY:  Artis Delay, M.D. ASSISTANT:  PROCEDURE DATE:  10/13/2011 PROCEDURE:  Colonoscopy with biopsy, Colonoscopy for control of bleeding  INDICATIONS:  RECTAL BLEEDING, DIARRHEA, AC WALL THICK ON CT MAY 2013  JUL 15: PLT CT 130k,INR 1.82  MEDICATIONS:   MAC sedation, administered by CRNA  DESCRIPTION OF PROCEDURE:    Physical exam was performed. Informed consent was obtained from the patient after explaining the benefits, risks, and alternatives to procedure.  The patient was connected to monitor and placed in left lateral position. Continuous oxygen was provided by nasal cannula and IV medicine administered through an indwelling cannula.  After administration of sedation and rectal exam, the patient's rectum was intubated and the  colonoscope was advanced under direct visualization to the cecum.  The scope was removed slowly by carefully examining the color, texture, anatomy, and integrity mucosa on the way out. The patient was recovered in endoscopy and discharged home in satisfactory condition. <<PROCEDUREIMAGES>>  FINDINGS:  A sessile polyp was found in the ascending colon.  An ulcer was found in the cecum.  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  Internal Hemorrhoids were found.  PREP QUALITY: GOOD CECAL W/D TIME:    21.5 minutes  COMPLICATIONS:    None  ENDOSCOPIC IMPRESSION: 1) Varices, rectal 2) Internal hemorrhoids 3) LESION IN ASCENDING COLON MOST LIKLEY CORRESPONDS TO FINDING ON CT-ONLY BIOPSIESx3 TAKEN DUE TO RISK OF BLEEDING 4) HEALED CECAL ULCER  RECOMMENDATIONS: AWAIT BIOPSY LOW FAT/HIGH FIER DIET NO BIOPSIES TAKED FOR MICROSCOPIC COLITIS DUE TO RISK OF BLEEDING & PT HAVING FOMRED STOOL LAST WEEK. TCS IN TO 1 YEAR  WHEN INR < 1.6> MAY NEED TCS/EMR AT [facility]  REPEAT EXAM:  No  ______________________________ Jonette Eva, MD  CC:  Artis Delay, M.D.  n. eSIGNED:   Angelmarie Ponzo at 10/13/2011 03:35 PM  [place], [place], [phone]

## 2011-10-13 NOTE — Preoperative (Signed)
Beta Blockers   Reason not to administer Beta Blockers:Not Applicable 

## 2011-10-13 NOTE — H&P (View-Only) (Signed)
Subjective:    Patient ID: Deanna Thompson, female    DOB: 04/26/1955, 55 y.o.   MRN: [id number]  PCP: [id number]  HPI RECORDING WEIGHT-263 LBS JUN 8 NOW 218 LBS TODAY. Making it on A REG DIET. C/O MILD ABD PAIN/TTP. NO MORE BIL LE SWELLING. ABD STILL PROMINENT BUT BETTER. SLEEPING LESS THAN USED TO AT NIGHT.  NAPS DURING THE DAY. NO PROBLEMS SWALLOWING. NO MORE NAUSEA OR VOMITING. HAVING SOFT CONNECTED STOOL. BmS: 3-4/DAY. SAW BLOOD IN STOOL: BRB-MINOR CRAMPING GETS BETTER AFTER BMs. WAS TOLD NOT TO DRIVE. FEELS SHE WILL BE READY IN A FEW WEEKS. READY FOR SOME OF PREDNISONE TO BE WEANED OFF. STOPPED BUMEX 6/15. NOT ON LASIX DUE TO ?DIARRHEA. No Etoh for 4-6 weeks.  Past Medical History  Diagnosis Date  . Brain aneurysm   . IBS (irritable bowel syndrome)   . GERD (gastroesophageal reflux disease)   . Panic attack   . Anxiety   . HTN (hypertension)   . Psoriatic arthritis   . ETOH abuse     Quit in 07/2011   . Sjogren's disease   . Cirrhosis     Presumably alcoholic cirrhosis    Past Surgical History  Procedure Date  . Cerebral aneurysm repair    Allergies  Allergen Reactions  . Omeprazole Shortness Of Breath  . Phenytoin Rash  . Cephalexin Other (See Comments)    Severe yeast infection    Current Outpatient Prescriptions  Medication Sig Dispense Refill  . escitalopram (LEXAPRO) 10 MG tablet Take 10 mg by mouth daily.        . esomeprazole (NEXIUM) 40 MG capsule Take 40 mg by mouth daily before breakfast.      . folic acid (FOLVITE) 1 MG tablet Take 1 tablet (1 mg total) by mouth daily.    . NON FORMULARY Gas-x gel    One tablet twice daily    . Olopatadine HCl (PATANASE NA) Place 1 spray into the nose 2 (two) times daily.    . potassium chloride 20 MEQ/15ML (10%) solution Take 30 mLs (40 mEq total) by mouth daily.    . spironolactone (ALDACTONE) 100 MG tablet Take 1 tablet (100 mg total) by mouth 2 (two) times daily.    . Tetrahydrozoline HCl (VISINE OP) Apply 2 drops to  eye 2 (two) times daily.    . PREDNISONE  40 MG DAILY    . ondansetron (ZOFRAN) 4 MG tablet Take 1 tablet (4 mg total) by mouth every 6 (six) hours as needed for nausea.        Review of Systems JUN 15: CR 0.96 K 3.2 AST 81 ALT 75 ALB 2.9 HB 14.7 MCV 104.2 PLT 123 INR 1.99 AMMONIA 30    Objective:   Physical Exam  Vitals reviewed. Constitutional: She is oriented to person, place, and time. She appears well-developed. No distress.  HENT:  Head: Normocephalic and atraumatic.  Mouth/Throat: Oropharynx is clear and moist. No oropharyngeal exudate.  Eyes: Pupils are equal, round, and reactive to light. Scleral icterus is present.  Neck: Normal range of motion. Neck supple.  Cardiovascular: Normal rate, regular rhythm and normal heart sounds.   Pulmonary/Chest: Effort normal and breath sounds normal. No respiratory distress.  Abdominal: Soft. Bowel sounds are normal. She exhibits distension (MILD). There is tenderness (MILD TTP IN LUQ). There is no rebound and no guarding.  Musculoskeletal: Normal range of motion. She exhibits no edema.  Lymphadenopathy:    She has no cervical adenopathy.  Neurological: She   is alert and oriented to person, place, and time.       NO FOCAL DEFICITS   Psychiatric: She has a normal mood and affect.          Assessment & Plan:   

## 2011-10-13 NOTE — Transfer of Care (Signed)
Immediate Anesthesia Transfer of Care Note  Patient: Deanna Thompson  Procedure(s) Performed: Procedure(s) (LRB): COLONOSCOPY WITH PROPOFOL (N/A) ESOPHAGOGASTRODUODENOSCOPY (EGD) WITH PROPOFOL (N/A) BIOPSY (N/A)  Patient Location: PACU  Anesthesia Type: MAC  Level of Consciousness: awake, alert , oriented and patient cooperative  Airway & Oxygen Therapy: Patient Spontanous Breathing and Patient connected to face mask oxygen  Post-op Assessment: Report given to PACU RN and Post -op Vital signs reviewed and stable  Post vital signs: Reviewed and stable  Complications: No apparent anesthesia complications

## 2011-10-13 NOTE — OR Nursing (Signed)
Last solid food  Sunday  , breakfast

## 2011-10-13 NOTE — Anesthesia Procedure Notes (Addendum)
Performed by: Corena Pilgrim L   Procedure Name: MAC Date/Time: 10/13/2011 10:31 AM Performed by: Carolyne Littles, Alexsus Papadopoulos L Pre-anesthesia Checklist: Patient identified, Patient being monitored, Emergency Drugs available, Timeout performed and Suction available Patient Re-evaluated:Patient Re-evaluated prior to inductionOxygen Delivery Method: Non-rebreather mask

## 2011-10-13 NOTE — Op Note (Signed)
[facility] [address] [place], [place]  [address]  ENDOSCOPY PROCEDURE REPORT  PATIENT:  Deanna, Thompson  MR#:  [id number] BIRTHDATE:  1956/02/01, 55 yrs. old  GENDER:  female  ENDOSCOPIST:  Jonette Eva, MD Referred by:  Artis Delay, M.D.  PROCEDURE DATE:  10/13/2011 PROCEDURE:  EGD with biopsy, 432[address], EGD for control of bleeding ASA CLASS: INDICATIONS:  screening for varices, NAUSEA/VOMITING RESOLVED-SOLID BM LAST WEEK. PT & ISBAND WORRIE ABOUT MILD CONFUSION AND DAYTIME SOMNOLENCE  MEDICATIONS:   MAC sedation, administered by CRNA TOPICAL ANESTHETIC:  Cetacaine Spray  DESCRIPTION OF PROCEDURE:     Physical exam was performed. Informed consent was obtained from the patient after explaining the benefits, risks, and alternatives to the procedure.  The patient was connected to the monitor and placed in the left lateral position.  Continuous oxygen was provided by nasal cannula and IV medicine administered through an indwelling cannula.  After administration of sedation, the patient's esophagus was intubated and the  endoscope was advanced under direct visualization to the second portion of the duodenum.  The scope was removed slowly by carefully examining the color, texture, anatomy, and integrity of the mucosa on the way out.  The patient was recovered in endoscopy and discharged home in satisfactory condition. <<PROCEDUREIMAGES>>  Grade I varices were found.  Mild gastritis was found & NOT BIOPSIED DUE TO RISK OF BLEEDING. NO VARICES IN THE CARDIA OR DUODENUM. MULTIPLE POLYPI LESIONS IN THE DUODENUM.Marland Kitchen ONE BIOPSIED VIA COLD FORCEPS. CAUTERY(20w) HAD TO BE APPLIED TO ACHIEVE HEMOSTASIS.  COMPLICATIONS:    None  ENDOSCOPIC IMPRESSION: 1) Grade I varices 2) Mild gastritis 3) DUODENAL POLYPOID LESIONS BEGINNING AT D1/D2  RECOMMENDATIONS: AWAIT BIOPSIES CONTINUE NEXIUM CHECK H PYLORI SEROLOGY BUMEX 1 MG QD ALDACTONE 100 MG QD XIFAXAN 550 MG BID HIGH  FIBER/LOW FAT DIET OPV AUG 2013  REPEAT EXAM:  No  ______________________________ Jonette Eva, MD  CC:  n. eSIGNED:   Rayah Fines at 10/13/2011 03:20 PM  [place], [place], [phone]

## 2011-10-14 ENCOUNTER — Telehealth: Payer: Self-pay

## 2011-10-14 NOTE — Telephone Encounter (Signed)
Called to tell pt Dr. Darrick Penna wants her to have BMP on 10/20/2011. Order was faxed to [facility].

## 2011-10-15 ENCOUNTER — Encounter (HOSPITAL_COMMUNITY): Payer: Self-pay | Admitting: Gastroenterology

## 2011-10-21 ENCOUNTER — Telehealth: Payer: Self-pay | Admitting: Gastroenterology

## 2011-10-21 NOTE — Telephone Encounter (Signed)
Please call pt. THE BIOPSIES SHOW SHE HAS HYPERPLASTIC POLYPS IN HER colon AND SMALL BOWEL. SHE SHOULD FOLLOW A HIGH FIBER/LOW FAT DIET. SHE NEEDS A TCS IN ONE YEAR WHEN HE PLATELETS, & INR ARE BETTER. CONTINUE NEXIUM. OPV IN AUG 2013.

## 2011-10-21 NOTE — Telephone Encounter (Signed)
Called and informed pt.  

## 2011-10-21 NOTE — Telephone Encounter (Signed)
Faxed to PCP

## 2011-10-22 NOTE — Telephone Encounter (Signed)
Pt is aware of Aug OV with SF and reminder in epic to have tcs in one year

## 2011-11-03 ENCOUNTER — Telehealth: Payer: Self-pay

## 2011-11-03 NOTE — Telephone Encounter (Signed)
Pt called to let Dr. Darrick Penna know her weight is up this AM to 231lbs. She said she does not have edema in her lower extremities. Her BP was 125/75 and pulse was 100. She does have some swelling in her abdomen. She has not done her BMP that she was supposed to do on 10/20/2011. She said she will try to get by the lab later today and do that. She said she is trying to stay away from processed foods as much as possible. But she did buy 1/2 pound of reduced sodium ham yesterday. She has an appt with Dr. Darrick Penna on 11/18/2011 and she wants to know if she needs to move it up any. Please advise!

## 2011-11-04 ENCOUNTER — Other Ambulatory Visit: Payer: Self-pay

## 2011-11-04 DIAGNOSIS — K2970 Gastritis, unspecified, without bleeding: Secondary | ICD-10-CM

## 2011-11-04 NOTE — Telephone Encounter (Signed)
PLEASE CALL PT.  SHE NEEDS TO STAY AWAY FROM ALL PROCESSED FOOD ALL THE TIME. SHE MAY NOT EAT LOW SODIUM HAM-4 OZ CONTAINS > 800 MG OF SODIUM. STOP EATING SODIUM. TAKE THE DIURETICS. GET HER BLOOD DRAWN FOR H PYLORI SEROLOGY AND BMP. I WILL CALL HER WITH HER RESULTS AND SEE HER NEXT WEEK.

## 2011-11-04 NOTE — Telephone Encounter (Signed)
Pt called.Was informed. She had her labs drawn this afternoon.

## 2011-11-04 NOTE — Telephone Encounter (Signed)
Called, LMOM to call. Lab order for the H. Pylori serology faxed to [facility]. BMP previously faxed.

## 2011-11-05 ENCOUNTER — Telehealth: Payer: Self-pay | Admitting: *Deleted

## 2011-11-05 LAB — BASIC METABOLIC PANEL
BUN: 10 mg/dL (ref 6–23)
CO2: 28 mEq/L (ref 19–32)
Calcium: 8.9 mg/dL (ref 8.4–10.5)
Glucose, Bld: 91 mg/dL (ref 70–99)
Potassium: 3.4 mEq/L — ABNORMAL LOW (ref 3.5–5.3)
Sodium: 134 mEq/L — ABNORMAL LOW (ref 135–145)

## 2011-11-05 LAB — H. PYLORI ANTIBODY, IGG: H Pylori IgG: <0.4 {ISR}

## 2011-11-05 NOTE — Telephone Encounter (Signed)
PLEASE CALL PT.  I DO NOT RECOMMEND SHE HAVE A MASSAGE AT THIS TIME. HER INR WILL NEED TO BE NORMAL BEFORE MASSAGE SHOULD BE PERFORMED. SHE WILL BRUISE/BLEED EASILY INTO HER MUSCLES AND SKIN.

## 2011-11-05 NOTE — Telephone Encounter (Signed)
Pt returned call and was informed.  

## 2011-11-05 NOTE — Telephone Encounter (Signed)
LMOM to call, and DO NOT GO TO GET MESSAGE.

## 2011-11-05 NOTE — Telephone Encounter (Signed)
Routing to Dr. Fields.  

## 2011-11-05 NOTE — Telephone Encounter (Signed)
Ms Forlenza called to see if it is ok for her to have a massage. Please call her back. Thanks.

## 2011-11-09 ENCOUNTER — Telehealth: Payer: Self-pay | Admitting: *Deleted

## 2011-11-09 NOTE — Telephone Encounter (Signed)
Deanna Thompson called today. She wants to be sure that she has all her blood work ready for her surgery with Dr Darrick Penna. Please call her back. Thanks.

## 2011-11-10 NOTE — Telephone Encounter (Signed)
LMOM to call.

## 2011-11-11 ENCOUNTER — Telehealth: Payer: Self-pay | Admitting: Gastroenterology

## 2011-11-11 NOTE — Telephone Encounter (Signed)
PLEASE CALL PT.  Her h pylori blood test is negative.

## 2011-11-11 NOTE — Telephone Encounter (Signed)
REVIEWED.  

## 2011-11-11 NOTE — Telephone Encounter (Signed)
Pt called and just wanted to know if she needs any labs before her OV with Dr. Darrick Penna next week. I told her it looks like she has had what was ordered. She asked about her vitamin supplement, did she need to continue. I told her not to stop anything that she had not been advised to stop and Dr. Darrick Penna will discuss with her at OV next week.

## 2011-11-12 NOTE — Telephone Encounter (Signed)
Pt aware of results 

## 2011-11-15 ENCOUNTER — Telehealth: Payer: Self-pay | Admitting: Gastroenterology

## 2011-11-15 NOTE — Telephone Encounter (Signed)
Called patient TO DISCUSS RESULTS. NO ANSWER-LVM.  

## 2011-11-16 NOTE — Telephone Encounter (Signed)
8/18 1609-Spoke with pt. Discussed bmp: K 3.4 on aldactone 100 mg qd and bumex 1 mg bid. Pt's weight up to 238 lbs. Increase Aldactone to 100 mg bid and continue Bumex. OPV THUR.

## 2011-11-17 ENCOUNTER — Encounter: Payer: Self-pay | Admitting: Gastroenterology

## 2011-11-18 ENCOUNTER — Ambulatory Visit (INDEPENDENT_AMBULATORY_CARE_PROVIDER_SITE_OTHER): Payer: BC Managed Care – PPO | Admitting: Gastroenterology

## 2011-11-18 ENCOUNTER — Encounter: Payer: Self-pay | Admitting: Gastroenterology

## 2011-11-18 VITALS — BP 126/77 | HR 100 | Temp 98.0°F | Ht 68.0 in | Wt 236.2 lb

## 2011-11-18 DIAGNOSIS — K635 Polyp of colon: Secondary | ICD-10-CM | POA: Insufficient documentation

## 2011-11-18 DIAGNOSIS — R188 Other ascites: Secondary | ICD-10-CM

## 2011-11-18 DIAGNOSIS — D126 Benign neoplasm of colon, unspecified: Secondary | ICD-10-CM

## 2011-11-18 DIAGNOSIS — K7460 Unspecified cirrhosis of liver: Secondary | ICD-10-CM

## 2011-11-18 NOTE — Assessment & Plan Note (Addendum)
ASCITES NOT IDEALLY CONTROLLED. LE EDMEA MINIMAL. LAST ETOH MAY 2013. HAVING TROUBLE WITH BALANCE-? DUE TO MILD ENCEPHALOPATHY. EGD/TCS UP TO DATE.   Follow up WITH [place].  CONTINUE XIFAXAN, BUMEX 1 MG BID AND ALDACTONE 100 MG BID. MAY NED TO ADD LACTULOSE.  HAVE LABS DRAWN AUG 26: CBC, CMP,PT/INR, AMMONIA.  WATCH YOUR SALT INTAKE & LIMIT YOUR WATER INTAKE TO 2 LITERS. HO GIVEN.  NEEDS TWINRIX.  FOLLOW UP IN 2 MOS.  TIME FOR CARE 40 MINS:  TO ANSWER QUESTIONS, EXPLAIN FUTURE MANAGEMENT, EXPECTATION OF LIVER DISEASE RECOVERY, WOUND CARE, REVIEW ENDOSCOPY.

## 2011-11-18 NOTE — Assessment & Plan Note (Signed)
NEEDS POLYPS REMOVED IF INR IMPROVES.  WILL REASSESS AT NEXT VISIT IN 2 MOS.

## 2011-11-18 NOTE — Progress Notes (Signed)
Subjective:    Patient ID: Deanna Thompson, female    DOB: 1955/12/27, 56 y.o.   MRN: [id number]  [id number]  HPI HAVING TROUBLE CONTROLLING FLUID/WEIGHT. Was tearful last week regarding her liver disease. ABLE TO DRIVE. Trying to increase activity list. ALDACTONE 100 MG BID AND BUMEX 1 MG BID. TAKING OUT SALT. FILLING UP WATER GLASS WITH ICE. FEELS SHE HAS FLUID IN HER BELLY. HAS TWO BUMPS FEEL LIKE 1/2 MARBLE (2 ON LEFT, 1 ON RIGHT). TENDENCY TO HAVE DRY SKIN. CONCERNED ABOUT AREAS ON HER RIGHT ARM. STILL FEELS SLEEPY AND LETHARGIC. WANTS TO SLEEP ALL ALL THE TIME. SLEEPS FOR 2-3 HOURS AT A TIME.  EATING SALADS, MORNING YOGURT, TOMATO PIE. SAW NUTRITION IN [place] NOT IMPRESSED. EATING FISH AND CHICKEN, & VEGGIES. HAVING A BM: Q2 DAYS.  HAS A LIST OF QUESTIONS.  OFF ON ENBREL AND HUMIRA OVER THE YEARS.  Past Medical History  Diagnosis Date  . Brain aneurysm   . IBS (irritable bowel syndrome)   . GERD (gastroesophageal reflux disease)   . Panic attack   . Anxiety   . HTN (hypertension)   . Psoriatic arthritis   . ETOH abuse     Quit in 07/2011   . Sjogren's disease   . Cirrhosis     Presumably alcoholic cirrhosis  . Complication of anesthesia   . PONV (postoperative nausea and vomiting)     Past Surgical History  Procedure Date  . Cerebral aneurysm repair   . Esophageal biopsy 10/13/2011    mild gastritis/duodenal polypoid lesion   . Colonoscopy 10/13/2011    internal hemorrhoids/varices rectal/lesion in ascending colon(HYPERPLASTIC POLYP)    Allergies  Allergen Reactions  . Omeprazole Shortness Of Breath  . Phenytoin Rash  . Cephalexin Other (See Comments)    Severe yeast infection    Current Outpatient Prescriptions  Medication Sig Dispense Refill  . bumetanide (BUMEX) 1 MG tablet 1 PO BID    . escitalopram (LEXAPRO) 10 MG tablet Take 10 mg by mouth daily.        Marland Kitchen esomeprazole (NEXIUM) 40 MG capsule Take 1 capsule (40 mg total) by mouth daily before breakfast.    .  folic acid (FOLVITE) 1 MG tablet Take 1 tablet (1 mg total) by mouth daily.    Marland Kitchen NASONEX 50 MCG/ACT nasal spray Place 2 sprays into the nose as needed. For allergies    . rifaximin (XIFAXAN) 550 MG TABS Take 1 tablet (550 mg total) by mouth 2 (two) times daily.    . Simethicone (GAS-X EXTRA STRENGTH) 125 MG CAPS Take 1 capsule by mouth daily.    Marland Kitchen spironolactone (ALDACTONE) 100 MG tablet 1 PO BID    . Tetrahydrozoline HCl (VISINE OP) Apply 2 drops to eye 2 (two) times daily.    . Olopatadine HCl (PATANASE NA) Place 1 spray into the nose 2 (two) times daily.    . ondansetron (ZOFRAN) 4 MG tablet Take 1 tablet (4 mg total) by mouth every 6 (six) hours as needed for nausea.        Review of Systems     Objective:   Physical Exam  Vitals reviewed. Constitutional: She is oriented to person, place, and time. No distress.  HENT:  Head: Normocephalic and atraumatic.  Mouth/Throat: Oropharynx is clear and moist. No oropharyngeal exudate.  Eyes: Pupils are equal, round, and reactive to light. Scleral icterus is present.  Neck: Normal range of motion. Neck supple.  Cardiovascular: Normal rate, regular rhythm and normal heart  sounds.   Pulmonary/Chest: Effort normal and breath sounds normal. No respiratory distress.  Abdominal: Soft. Bowel sounds are normal. She exhibits distension. There is no tenderness.       SHIFT IN DULLNESS  Musculoskeletal: She exhibits edema (TRACE BIL LE).  Neurological: She is alert and oriented to person, place, and time.       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          Assessment & Plan:

## 2011-11-18 NOTE — Patient Instructions (Addendum)
Follow up WITH [place].  CONTINUE BUMEX 1 MG BID AND ALDACTONE 100 MG BID.  HAVE LABS DRAWN AUG 26.  WATCH YOUR SALT INTAKE & LIMIT YOUR WATER INTAKE TO 2 LITERS.  FOLLOW UP IN 2 MOS.   2 Gram Low Sodium Diet A 2 gram sodium diet restricts the amount of sodium in the diet to no more than 2 g or 2000 mg daily. Limiting the amount of sodium is often used to help lower blood pressure. It is important if you have heart, liver, or kidney problems. Many foods contain sodium for flavor and sometimes as a preservative. When the amount of sodium in a diet needs to be low, it is important to know what to look for when choosing foods and drinks. The following includes some information and guidelines to help make it easier for you to adapt to a low sodium diet.  QUICK TIPS  Do not add salt to food.   Avoid convenience items and fast food.   Choose unsalted snack foods.   Buy lower sodium products, often labeled as "lower sodium" or "no salt added."   Check food labels to learn how much sodium is in 1 serving.   When eating at a restaurant, ask that your food be prepared with less salt or none, if possible.   READING FOOD LABELS FOR SODIUM INFORMATION The nutrition facts label is a good place to find how much sodium is in foods. Look for products with no more than 500 to 600 mg of sodium per meal and no more than 150 mg per serving. Remember that 2 g = 2000 mg.  The food label may also list foods as:  Sodium-free: Less than 5 mg in a serving.   Very low sodium: 35 mg or less in a serving.   Low-sodium: 140 mg or less in a serving.   Light in sodium: 50% less sodium in a serving. For example, if a food that usually has 300 mg of sodium is changed to become light in sodium, it will have 150 mg of sodium.   Reduced sodium: 25% less sodium in a serving. For example, if a food that usually has 400 mg of sodium is changed to reduced sodium, it will have 300 mg of sodium.    CHOOSING FOODS Grains  Avoid: Salted crackers and snack items. Some cereals, including instant hot cereals. Bread stuffing and biscuit mixes. Seasoned rice or pasta mixes.   Choose: Unsalted snack items. Low-sodium cereals, oats, puffed wheat and rice, shredded wheat. English muffins and bread. Pasta.  Meats  Avoid: Salted, canned, smoked, spiced, pickled meats, including fish and poultry. Bacon, ham, sausage, cold cuts, hot dogs, anchovies.   Choose: Low-sodium canned tuna and salmon. Fresh or frozen meat, poultry, and fish.  Dairy  Avoid: Processed cheese and spreads. Cottage cheese. Buttermilk and condensed milk. Regular cheese.   Choose: Milk. Low-sodium cottage cheese. Yogurt. Sour cream. Low-sodium cheese.  Fruits and Vegetables  Avoid: Regular canned vegetables. Regular canned tomato sauce and paste. Frozen vegetables in sauces. Olives. Rosita Fire. Relishes. Sauerkraut.   Choose: Low-sodium canned vegetables. Low-sodium tomato sauce and paste. Frozen or fresh vegetables. Fresh and frozen fruit.  Condiments  Avoid: Canned and packaged gravies. Worcestershire sauce. Tartar sauce. Barbecue sauce. Soy sauce. Steak sauce. Ketchup. Onion, garlic, and table salt. Meat flavorings and tenderizers.   Choose: Fresh and dried herbs and spices. Low-sodium varieties of mustard and ketchup. Lemon juice. Tabasco sauce. Horseradish.  SAMPLE 2 GRAM  SODIUM MEAL PLAN Breakfast / Sodium (mg)  1 cup low-fat milk / 143 mg   2 slices whole-wheat toast / 270 mg   1 tbs heart-healthy margarine / 153 mg   1 hard-boiled egg / 139 mg   1 small orange / 0 mg  Lunch / Sodium (mg)  1 cup raw carrots / 76 mg    cup hummus / 298 mg   1 cup low-fat milk / 143 mg    cup red grapes / 2 mg   1 whole-wheat pita bread / 356 mg  Dinner / Sodium (mg)  1 cup whole-wheat pasta / 2 mg   1 cup low-sodium tomato sauce / 73 mg   3 oz lean ground beef / 57 mg   1 small side salad (1 cup raw spinach  leaves,  cup cucumber,  cup yellow bell pepper) with 1 tsp olive oil and 1 tsp red wine vinegar / 25 mg  Snack / Sodium (mg)  1 container low-fat vanilla yogurt / 107 mg   3 graham cracker squares / 127 mg  Nutrient Analysis  Calories: 2033   Protein: 77 g   Carbohydrate: 282 g   Fat: 72 g   Sodium: 1971 mg  Document Released: 03/16/2005 Document Revised: 03/05/2011 Document Reviewed: 06/17/2009 [facility] Patient Information 2012 [place], [place].

## 2011-11-18 NOTE — Assessment & Plan Note (Signed)
ASCITES NOT IDEALLY CONTROLLED. LE EDMEA MINIMAL. LAST ETOH MAY 2013.   Follow up WITH [place].  CONTINUE BUMEX 1 MG BID AND ALDACTONE 100 MG BID.  HAVE LABS DRAWN AUG 26: CMP, CBC, PT/INR, AMMONIA.  WATCH YOUR SALT INTAKE & LIMIT YOUR WATER INTAKE TO 2 LITERS.  FOLLOW UP IN 2 MOS.

## 2011-11-19 ENCOUNTER — Telehealth: Payer: Self-pay | Admitting: Gastroenterology

## 2011-11-19 NOTE — Telephone Encounter (Signed)
REVIEWED.  

## 2011-11-19 NOTE — Progress Notes (Signed)
Faxed to PCP

## 2011-11-19 NOTE — Telephone Encounter (Signed)
[facility] [place] on Tuesday August 27th at 10:00 and I [facility] for patient to return my call to confirm

## 2011-11-24 LAB — COMPREHENSIVE METABOLIC PANEL
BUN: 11 mg/dL (ref 6–23)
CO2: 28 mEq/L (ref 19–32)
Calcium: 8.9 mg/dL (ref 8.4–10.5)
Chloride: 92 mEq/L — ABNORMAL LOW (ref 96–112)
Creat: 0.96 mg/dL (ref 0.50–1.10)
Total Bilirubin: 4.7 mg/dL — ABNORMAL HIGH (ref 0.3–1.2)

## 2011-11-24 LAB — PROTIME-INR: INR: 1.84 — ABNORMAL HIGH (ref <1.50)

## 2011-11-24 LAB — CBC WITH DIFFERENTIAL/PLATELET
Eosinophils Relative: 4 % (ref 0–5)
HCT: 32.8 % — ABNORMAL LOW (ref 36.0–46.0)
Hemoglobin: 11.6 g/dL — ABNORMAL LOW (ref 12.0–15.0)
Lymphocytes Relative: 25 % (ref 12–46)
Lymphs Abs: 2.7 10*3/uL (ref 0.7–4.0)
MCV: 92.1 fL (ref 78.0–100.0)
Monocytes Absolute: 1.4 10*3/uL — ABNORMAL HIGH (ref 0.1–1.0)
Monocytes Relative: 13 % — ABNORMAL HIGH (ref 3–12)
Neutro Abs: 6.1 10*3/uL (ref 1.7–7.7)
WBC: 10.7 10*3/uL — ABNORMAL HIGH (ref 4.0–10.5)

## 2011-11-24 LAB — SODIUM, URINE, RANDOM: Sodium, Ur: 13 mEq/L

## 2011-11-24 NOTE — Progress Notes (Signed)
Done

## 2011-11-25 ENCOUNTER — Other Ambulatory Visit: Payer: Self-pay

## 2011-11-25 ENCOUNTER — Telehealth: Payer: Self-pay | Admitting: Gastroenterology

## 2011-11-25 DIAGNOSIS — K7460 Unspecified cirrhosis of liver: Secondary | ICD-10-CM

## 2011-11-25 MED ORDER — POTASSIUM CHLORIDE ER 10 MEQ PO TBCR: EXTENDED_RELEASE_TABLET | ORAL | Status: DC

## 2011-11-25 NOTE — Telephone Encounter (Signed)
CALLED PT. HAD VISIT WITH DR. Beatriz Stallion. NO NEED FOR TRANSPLANT AT THIS TIME. DISCUSSED LABS. K IS LOW. SEEN AT [facility]. GOOD VISIT. RE-CHECK LABS IN DEC 2013. NEEDS ETOH CESSATION DOCUMENTATION.  GENERALLY FELT WELL CARED FOR.   DISCUSSED MED CHANGES-ALDACTONE 100 MG TID. WEIGHT: 236 LBS. TAKING BUMEX 1 MG. KCL 40 MEQ x2. RECHECK BMP/AMMONIA NEXT WED.  REFER TO DR. [facility] [place]: Psoriatic arthritis, Sjogren's disease, CIRRHOSIS(PLT 71k, INR 1.84).

## 2011-11-25 NOTE — Telephone Encounter (Signed)
Lab order faxed to [facility] 

## 2011-12-01 NOTE — Telephone Encounter (Signed)
Called pt. LMOM lab order was faxed to [facility] and she is to do her labs tomorrow.

## 2011-12-02 ENCOUNTER — Telehealth: Payer: Self-pay

## 2011-12-02 LAB — BASIC METABOLIC PANEL
Chloride: 96 mEq/L (ref 96–112)
Creat: 1.03 mg/dL (ref 0.50–1.10)
Potassium: 3.3 mEq/L — ABNORMAL LOW (ref 3.5–5.3)
Sodium: 134 mEq/L — ABNORMAL LOW (ref 135–145)

## 2011-12-02 NOTE — Telephone Encounter (Signed)
PLEASE CALL PT.  HER LOSING WEIGHT IS DUE TO LOSS OF FLUID. THAT MEANS THE DIURETICS ARE WORKING.  WILL CALL HER WITH LAB RESULTS.

## 2011-12-02 NOTE — Telephone Encounter (Signed)
Pt dropped by the office and said she just wanted Dr. Darrick Penna to know that she is losing weight. Said she weighed 227 lbs at home this AM. I weighed her here and she was 228.8 lbs. Told her I would let Dr. Darrick Penna know.

## 2011-12-02 NOTE — Telephone Encounter (Signed)
Called and informed pt.  

## 2011-12-03 ENCOUNTER — Other Ambulatory Visit: Payer: Self-pay

## 2011-12-03 DIAGNOSIS — K7460 Unspecified cirrhosis of liver: Secondary | ICD-10-CM

## 2011-12-03 MED ORDER — POTASSIUM CHLORIDE ER 10 MEQ PO TBCR: EXTENDED_RELEASE_TABLET | ORAL | Status: DC

## 2011-12-03 NOTE — Telephone Encounter (Signed)
Referral has been faxed to Dr. [facility] and the office will contact the patient with date and time

## 2011-12-03 NOTE — Telephone Encounter (Addendum)
CALLED PT. EXPLAINED RESULTS OF BMP/AMMONIA. ?TAKING XIFAXAN BID. TOOK LACTULOSE BEFORE AND HAD DIARRHEA.  TAKE LACTULOSE 1 TBSP BID AND  KCL 20 MeQ DAILY. CALL PT TO HAVE RECHECK OF  BMP/AMMONIA IN 2 WEEKS.  PT WORKING ON ETOH CESSATION DOCUMENTATION.

## 2011-12-03 NOTE — Telephone Encounter (Signed)
REVIEWED.  

## 2011-12-03 NOTE — Telephone Encounter (Signed)
Patient is scheduled with Dr. Dareen Piano on 12/14/11 at 9:00 am and patient is aware

## 2011-12-03 NOTE — Addendum Note (Signed)
Addended by: [place] on: 12/03/2011 02:45 PM   Modules accepted: Orders

## 2011-12-04 ENCOUNTER — Telehealth: Payer: Self-pay | Admitting: Gastroenterology

## 2011-12-04 MED ORDER — LACTULOSE 10 GM/15ML PO SOLN: ORAL | Status: AC

## 2011-12-04 NOTE — Telephone Encounter (Signed)
Pt called to speak with SF or nurse when available. She did not go in detail what it was regarding just that she had spoke with SF yesterday. Please call her back at [phone]

## 2011-12-04 NOTE — Telephone Encounter (Signed)
PLEASE CALL PT. RX FOR LACTULOSE SENT. 

## 2011-12-04 NOTE — Telephone Encounter (Signed)
Pt said she needs the Lactulose Rx sent to [facility]. She does not have any.

## 2011-12-04 NOTE — Telephone Encounter (Signed)
Called and informed pt.  

## 2011-12-07 ENCOUNTER — Other Ambulatory Visit: Payer: Self-pay | Admitting: Gastroenterology

## 2011-12-07 MED ORDER — FOLIC ACID 1 MG PO TABS: 1.0000 mg | ORAL_TABLET | Freq: Every day | ORAL | Status: DC

## 2011-12-08 ENCOUNTER — Telehealth: Payer: Self-pay

## 2011-12-08 MED ORDER — SPIRONOLACTONE 100 MG PO TABS: ORAL_TABLET | ORAL | Status: DC

## 2011-12-08 NOTE — Telephone Encounter (Signed)
Rx was received at [facility] and pt is aware.

## 2011-12-08 NOTE — Telephone Encounter (Signed)
Pt left VM that she cannot get her Spironolactone filled. It was increased to 100 mg tid. Please send Rx to [facility] pharmacy.

## 2011-12-08 NOTE — Telephone Encounter (Signed)
PLEASE CALL pharmacy. Confirm rx received. THEN CALL PT TO LET HER KNOW IT'S BEEN SENT.

## 2011-12-14 ENCOUNTER — Telehealth: Payer: Self-pay

## 2011-12-14 NOTE — Telephone Encounter (Signed)
FYI..the patient called to say she is now at 222 lbs. She is feeling fine. Taking the Spironolactone 100 mg tid. She is aware Dr. Darrick Penna is off today, and will get the message tomorrow.

## 2011-12-15 NOTE — Telephone Encounter (Signed)
Pt is aware that labs are due this week. She will go Friday to do them.

## 2011-12-16 NOTE — Telephone Encounter (Signed)
REVIEWED.  

## 2011-12-18 LAB — AMMONIA: Ammonia: 32 umol/L (ref 11–60)

## 2011-12-18 LAB — BASIC METABOLIC PANEL
Chloride: 93 mEq/L — ABNORMAL LOW (ref 96–112)
Glucose, Bld: 103 mg/dL — ABNORMAL HIGH (ref 70–99)
Potassium: 3.5 mEq/L (ref 3.5–5.3)
Sodium: 131 mEq/L — ABNORMAL LOW (ref 135–145)

## 2011-12-21 ENCOUNTER — Telehealth: Payer: Self-pay

## 2011-12-21 ENCOUNTER — Telehealth: Payer: Self-pay | Admitting: Gastroenterology

## 2011-12-21 MED ORDER — BUMETANIDE 1 MG PO TABS: ORAL_TABLET | ORAL | Status: DC

## 2011-12-21 NOTE — Telephone Encounter (Signed)
PLEASE CALL PT.  HER LABS LOOK GOOD. HER AMMONIA LEVEL IS NORMAL AND HER POTASSIUM IS NORMAL.

## 2011-12-21 NOTE — Telephone Encounter (Signed)
LMOM that labs are normal and if she has questions to call.

## 2011-12-21 NOTE — Telephone Encounter (Signed)
Called pt. LMOM that new Rx was sent in for bid. Informed Sherrine Maples, the pharmacist.

## 2011-12-21 NOTE — Telephone Encounter (Signed)
PT NEEDS BID BUMEX. RX SENT. PLEASE CALL PT.

## 2011-12-21 NOTE — Telephone Encounter (Signed)
Pt left VM for me to call pharmacist in reference to her Bumex. I called Glenn,and he wants to confirm if pt is supposed to be on Bumex 1 mg daily or bid? Please advise!

## 2011-12-21 NOTE — Telephone Encounter (Signed)
Faxed to PCP

## 2011-12-25 ENCOUNTER — Telehealth: Payer: Self-pay | Admitting: Gastroenterology

## 2011-12-25 NOTE — Telephone Encounter (Signed)
Pt called to make follow up OV with SF and wanted [place] to know her weight has dropped to 209 lbs

## 2011-12-25 NOTE — Telephone Encounter (Signed)
REVIEWED.  

## 2011-12-25 NOTE — Telephone Encounter (Addendum)
Called pt. She said just FYI Dr. Darrick Penna on her weight of 209 lbs. She is feeling well, eating well. Has appt to see Dr. Darrick Penna on 01/06/2012.

## 2012-01-06 ENCOUNTER — Encounter: Payer: Self-pay | Admitting: Gastroenterology

## 2012-01-06 ENCOUNTER — Ambulatory Visit (INDEPENDENT_AMBULATORY_CARE_PROVIDER_SITE_OTHER): Payer: BC Managed Care – PPO | Admitting: Gastroenterology

## 2012-01-06 VITALS — BP 132/82 | HR 95 | Temp 98.6°F | Ht 68.0 in | Wt 204.2 lb

## 2012-01-06 DIAGNOSIS — K635 Polyp of colon: Secondary | ICD-10-CM

## 2012-01-06 DIAGNOSIS — D126 Benign neoplasm of colon, unspecified: Secondary | ICD-10-CM

## 2012-01-06 DIAGNOSIS — R188 Other ascites: Secondary | ICD-10-CM

## 2012-01-06 DIAGNOSIS — K7460 Unspecified cirrhosis of liver: Secondary | ICD-10-CM

## 2012-01-06 NOTE — Assessment & Plan Note (Signed)
NEEDS TCS TO REMOVE POLYP AFTER INR < 1.5

## 2012-01-06 NOTE — Assessment & Plan Note (Signed)
WEIGHT DOWN 32 LBS.  CONTINUE BUMEX, AND ALDACTONE. LOW NA DIET. AMY GO TO [facility]. OPV IN JAN 2014.

## 2012-01-06 NOTE — Progress Notes (Signed)
Faxed to PCP

## 2012-01-06 NOTE — Progress Notes (Signed)
Subjective:    Patient ID: Deanna Thompson, female    DOB: Mar 26, 1956, 56 y.o.   MRN: [id number]  PCP: [facility]  HPI  LAST SEEN AUG 2013. Questions-sternum. BEEN TO [facility]. HAS A EAP REP HELPING HER. WALKING W/O A CANE. MAY USE CART IN [facility]ERY STORE. LIKED DR. ANDERSON. BURSITIS IN HIP AND FEELS BETTER NOW. WAS A NINE AND NOW A 2. AND NOW L HIP HURTS. BELLY'S SMALLER. [facility] THINKS SHE STILL HAS SOME ASCITES. WANTS TO KNOW IF ANYWAY TO KEEP FROM BANGING/BRUISING HERSELF. WANTS LIVER PALPATED. SORE ON LEFT SIDE. CAN SHE GO EAT AT THE [facility]. MOISTURIZING HER SKIN.  Past Medical History  Diagnosis Date  . Brain aneurysm   . IBS (irritable bowel syndrome)   . GERD (gastroesophageal reflux disease)   . Panic attack   . Anxiety   . HTN (hypertension)   . Psoriatic arthritis   . ETOH abuse     Quit in 07/2011   . Sjogren's disease   . Cirrhosis     Presumably alcoholic cirrhosis  . Complication of anesthesia   . PONV (postoperative nausea and vomiting)     Past Surgical History  Procedure Date  . Cerebral aneurysm repair   . Esophageal biopsy 10/13/2011    mild gastritis/duodenal polypoid lesion   . Colonoscopy 10/13/2011    internal hemorrhoids/varices rectal/lesion in ascending colon(HYPERPLASTIC POLYP)    Allergies  Allergen Reactions  . Omeprazole Shortness Of Breath  . Phenytoin Rash  . Cephalexin Other (See Comments)    Severe yeast infection    Current Outpatient Prescriptions  Medication Sig Dispense Refill  . bumetanide (BUMEX) 1 MG tablet 1 PO bid    . escitalopram (LEXAPRO) 10 MG tablet Take 10 mg by mouth daily.      Marland Kitchen esomeprazole (NEXIUM) 40 MG capsule Take 1 capsule (40 mg total) by mouth daily before breakfast.    . folic acid (FOLVITE) 1 MG tablet Take 1 tablet (1 mg total) by mouth daily.    Marland Kitchen NASONEX 50 MCG/ACT nasal spray Place 2 sprays into the nose as needed. For allergies    . Olopatadine HCl (PATANASE NA) Place 1 spray into the nose 2 (two)  times daily.    . ondansetron (ZOFRAN) 4 MG tablet Take 1 tablet (4 mg total) by mouth every 6 (six) hours as needed for nausea.    . rifaximin (XIFAXAN) 550 MG TABS Take 1 tablet (550 mg total) by mouth 2 (two) times daily.    . Simethicone (GAS-X EXTRA STRENGTH) 125 MG CAPS Take 1 capsule by mouth daily.    Marland Kitchen spironolactone (ALDACTONE) 100 MG tablet Take 100 mg by mouth 2 (two) times daily. 1 PO tid    . Tetrahydrozoline HCl (VISINE OP) Apply 2 drops to eye 2 (two) times daily.    . potassium chloride (K-DUR) 10 MEQ tablet 2 PO DAILY        Review of Systems     Objective:   Physical Exam  Vitals reviewed. Constitutional: She is oriented to person, place, and time. No distress.  HENT:  Head: Normocephalic and atraumatic.  Mouth/Throat: Oropharynx is clear and moist. No oropharyngeal exudate.  Eyes: Pupils are equal, round, and reactive to light. Right eye discharge: MILD. Scleral icterus is present.  Neck: Normal range of motion. Neck supple.  Cardiovascular: Normal rate, regular rhythm and normal heart sounds.   Pulmonary/Chest: Effort normal and breath sounds normal. No respiratory distress.  Abdominal: Soft. Bowel sounds are  normal. She exhibits distension (MILD). There is no tenderness. There is no rebound and no guarding.  Musculoskeletal: She exhibits no edema.  Neurological: She is alert and oriented to person, place, and time.       NO  NEW FOCAL DEFICITS   Psychiatric: She has a normal mood and affect.          Assessment & Plan:

## 2012-01-06 NOTE — Assessment & Plan Note (Signed)
CLINICALLY IMPROVED.  CONTINUE XIFAXAN, BUMEX, AND ALDACTONE. LOW NA DIET. MAY GO TO [facility]. OPV IN JAN 2014.

## 2012-01-06 NOTE — Patient Instructions (Signed)
CONTINUE XIFAXAN, BUMEX, AND ALDACTONE.  CONTINUE A LOW SODIUM DIET. YOU MAY GO TO A [facility].  GET LABS DRAWN THE WEEK BEFORE THANKSGIVING.  FOLLOW UP IN JAN 2014.

## 2012-01-07 ENCOUNTER — Other Ambulatory Visit: Payer: Self-pay

## 2012-01-07 DIAGNOSIS — K7460 Unspecified cirrhosis of liver: Secondary | ICD-10-CM

## 2012-01-07 NOTE — Progress Notes (Signed)
PT NEEDS CMP, PT/INR, CBC THE WEEK PRIOR TO T-GVNG.

## 2012-01-07 NOTE — Progress Notes (Signed)
Reminder in epic to follow up witn SF in [place] in January 2014

## 2012-01-07 NOTE — Progress Notes (Signed)
Lab orders on file for 02/15/2012.

## 2012-01-11 ENCOUNTER — Telehealth: Payer: Self-pay

## 2012-01-11 NOTE — Telephone Encounter (Signed)
Pt called with FYI for Dr. Darrick Penna, she said because she was told to keep [place] posted with changes in weight.  She has gained 5 pounds in one week. She has no edema, no SOB. She feels a lot of gas. She is having at least one good BM daily.

## 2012-01-11 NOTE — Telephone Encounter (Signed)
Pt returned call and was informed.  

## 2012-01-11 NOTE — Telephone Encounter (Signed)
PLEASE CALL PT.  SHE SHOULD CONTINUE HER FLUIDS PILLS, & WATCH HER SALT AND WATER INTAKE.

## 2012-01-11 NOTE — Telephone Encounter (Signed)
LMOM to call.

## 2012-01-12 ENCOUNTER — Telehealth: Payer: Self-pay

## 2012-01-12 NOTE — Telephone Encounter (Signed)
Pt said she had a little fall this morning on her step, she said it had a little black ice. She said her wrists were OK. Concerned about her bottom and coccyx. She wanted to know if Dr. Darrick Penna would want to see her, I told her she should go see PCP if she has concerns from a fall and she said she said OK.

## 2012-01-12 NOTE — Telephone Encounter (Signed)
REVIEWED. AGREE. 

## 2012-01-13 ENCOUNTER — Other Ambulatory Visit: Payer: Self-pay

## 2012-01-13 DIAGNOSIS — K7460 Unspecified cirrhosis of liver: Secondary | ICD-10-CM

## 2012-02-01 ENCOUNTER — Other Ambulatory Visit (HOSPITAL_COMMUNITY)
Admission: RE | Admit: 2012-02-01 | Discharge: 2012-02-01 | Disposition: A | Discharge status: Home / Self Care | Payer: BC Managed Care – PPO | Source: Ambulatory Visit | Attending: Obstetrics & Gynecology | Admitting: Obstetrics & Gynecology

## 2012-02-01 ENCOUNTER — Other Ambulatory Visit: Payer: Self-pay | Admitting: Obstetrics & Gynecology

## 2012-02-01 DIAGNOSIS — Z01419 Encounter for gynecological examination (general) (routine) without abnormal findings: Secondary | ICD-10-CM | POA: Insufficient documentation

## 2012-02-02 ENCOUNTER — Other Ambulatory Visit: Payer: Self-pay | Admitting: Obstetrics & Gynecology

## 2012-02-02 DIAGNOSIS — Z139 Encounter for screening, unspecified: Secondary | ICD-10-CM

## 2012-02-09 ENCOUNTER — Ambulatory Visit (HOSPITAL_COMMUNITY)
Admission: RE | Admit: 2012-02-09 | Discharge: 2012-02-09 | Disposition: A | Discharge status: Home / Self Care | Payer: BC Managed Care – PPO | Source: Ambulatory Visit | Attending: Obstetrics & Gynecology | Admitting: Obstetrics & Gynecology

## 2012-02-09 ENCOUNTER — Telehealth: Payer: Self-pay

## 2012-02-09 DIAGNOSIS — Z1231 Encounter for screening mammogram for malignant neoplasm of breast: Secondary | ICD-10-CM | POA: Insufficient documentation

## 2012-02-09 DIAGNOSIS — Z139 Encounter for screening, unspecified: Secondary | ICD-10-CM

## 2012-02-09 NOTE — Telephone Encounter (Signed)
Called and informed pt.  

## 2012-02-09 NOTE — Telephone Encounter (Signed)
PLEASE CALL PT.  SHE MAY HAVE A MILD VIRAL ILLNESS. IF SHE IS NOT BETTER IN 3 DAYS, SHE SHOULD SEE HER PCP FOR A UA. SHE SHOULD STOP DRINK SO MUCH WATER. SHE SHOULD DRINK NO MORE THAN 32-36 OZ A DAY. SHE ONLY NEEDS TO DRINK ENOUGH WATER TO KEEP HER URINE LIGHT YELLOW.

## 2012-02-09 NOTE — Telephone Encounter (Signed)
Pt called and said she has been having some nausea daily for the last couple of weeks and she has vomited 3-4 times. She has not changed her eating habits. Not drinking alcohol. Drinks 64 oz of water daily. She has not had any swelling of the extremities but her abdomen feels tight about bed time nightly. No weight change. BM's are normal. ( If she misses a day she will have 2 BM's the next day). Please advise!

## 2012-02-16 LAB — COMPREHENSIVE METABOLIC PANEL
ALT: 21 U/L (ref 0–35)
AST: 41 U/L — ABNORMAL HIGH (ref 0–37)
CO2: 30 mEq/L (ref 19–32)
Chloride: 100 mEq/L (ref 96–112)
Sodium: 137 mEq/L (ref 135–145)
Total Bilirubin: 4.1 mg/dL — ABNORMAL HIGH (ref 0.3–1.2)
Total Protein: 7 g/dL (ref 6.0–8.3)

## 2012-02-16 LAB — CBC WITH DIFFERENTIAL/PLATELET
Basophils Relative: 1 % (ref 0–1)
Eosinophils Absolute: 0.3 10*3/uL (ref 0.0–0.7)
Eosinophils Relative: 3 % (ref 0–5)
Hemoglobin: 13.1 g/dL (ref 12.0–15.0)
MCH: 33.1 pg (ref 26.0–34.0)
MCHC: 35.7 g/dL (ref 30.0–36.0)
MCV: 92.7 fL (ref 78.0–100.0)
Monocytes Absolute: 0.7 10*3/uL (ref 0.1–1.0)
Monocytes Relative: 8 % (ref 3–12)
Neutrophils Relative %: 60 % (ref 43–77)

## 2012-02-17 LAB — PROTIME-INR: Prothrombin Time: 18.7 seconds — ABNORMAL HIGH (ref 11.6–15.2)

## 2012-02-22 ENCOUNTER — Telehealth: Payer: Self-pay

## 2012-02-22 NOTE — Telephone Encounter (Signed)
Pt called for results of labs done on 02/16/2012. ( Dr. Darrick Penna is out) Routing to Gerrit Halls, NP to advise!

## 2012-02-22 NOTE — Telephone Encounter (Signed)
Called and informed pt.  

## 2012-02-22 NOTE — Telephone Encounter (Signed)
No significant changes in blood work. Tbili and AST improved slightly. INR 1.6, improved. Plts normal. Further recommendations per [facility].

## 2012-02-23 NOTE — Progress Notes (Addendum)
PLEASE CALL PT.  HER LABS ARE IMPROVED SINCE JUN 2013. HER INR IS NEAR NORMAL AT 1.6. ALL THIS MEANS HER LIVER IS RECOVERING. WE WILL RECHECK THE LABS AGAIN IN FEB 2014. OPV IN JAN 2014.

## 2012-02-23 NOTE — Progress Notes (Signed)
Called and informed pt. She requested a copy of labs.Printed and she will pick up at front.

## 2012-03-03 ENCOUNTER — Telehealth: Payer: Self-pay

## 2012-03-03 NOTE — Telephone Encounter (Signed)
Called and told pt.

## 2012-03-03 NOTE — Telephone Encounter (Signed)
PLEASE CALL PT.  PT MAY TAKE GUAIFENESIN TABLETS FOR COUGH. THEY DO NOT CONTAIN ALCOHOL.

## 2012-03-03 NOTE — Telephone Encounter (Signed)
Pt called and said she would like to know if Dr. Darrick Penna would recommend a cough syrup OTC that doesn't have alcohol. She is having some allergy problems and just a little cough, nothing she needs to see PCP at this time. Just wondered if she would recommend something because of her liver.

## 2012-03-08 ENCOUNTER — Telehealth: Payer: Self-pay

## 2012-03-08 NOTE — Telephone Encounter (Signed)
Pt left VM that she thought she was having a little fluid in her upper and left abdomen. Said she does not have any lower extremity edema. Her BM's are normal. She just wanted to check in with Dr. Darrick Penna before she goes out of town. I tried to call her back at home to get more info. LMOM for her to call.

## 2012-03-08 NOTE — Telephone Encounter (Signed)
Get more information. Sounds like limited fluid given no lower extremity edema. As long as no significant change in weight and she does not feel her abdomen is very distended, I would not make any changes.   Save for SLF.

## 2012-03-08 NOTE — Telephone Encounter (Signed)
Called pt and left the message on machine as to what Verlon Au had said. Asked her to call and give a little more information. Also, told her if she starts having lower extremity edema, significant weight gain or any shortness of breath she should notify us or PCP at once.

## 2012-03-09 NOTE — Telephone Encounter (Signed)
Pt left VM this AM. She said she feels she is OK. She just has a little abdomen distention, no edema and her weight varies from 2-2.5 lbs increase sometimes. She is not having any other symptons. She is traveling and said if we needed to call her, her number will be [phone] after Wed.

## 2012-03-09 NOTE — Telephone Encounter (Signed)
No other recommendations. F/U with SLF in 03/2012.

## 2012-03-11 ENCOUNTER — Encounter: Payer: Self-pay | Admitting: *Deleted

## 2012-03-11 NOTE — Telephone Encounter (Signed)
REVIEWED. AGREE. 

## 2012-04-06 ENCOUNTER — Encounter: Payer: Self-pay | Admitting: Gastroenterology

## 2012-04-07 ENCOUNTER — Ambulatory Visit (INDEPENDENT_AMBULATORY_CARE_PROVIDER_SITE_OTHER): Payer: BC Managed Care – PPO | Admitting: Gastroenterology

## 2012-04-07 ENCOUNTER — Other Ambulatory Visit: Payer: Self-pay | Admitting: Gastroenterology

## 2012-04-07 ENCOUNTER — Encounter: Payer: Self-pay | Admitting: Gastroenterology

## 2012-04-07 VITALS — BP 124/73 | HR 98 | Temp 97.8°F | Ht 68.0 in | Wt 211.6 lb

## 2012-04-07 DIAGNOSIS — K7460 Unspecified cirrhosis of liver: Secondary | ICD-10-CM

## 2012-04-07 DIAGNOSIS — R188 Other ascites: Secondary | ICD-10-CM

## 2012-04-07 NOTE — Progress Notes (Signed)
Subjective:    Patient ID: Deanna Thompson, female    DOB: 08/29/1955, 57 y.o.   MRN: [id number]  PCP: FUSCO  HPI CONCERNED ABOUT HER HERNIA AT HER NAVEL AND POSSIBLE FLUID RETAINED. HAVING OCCASIONAL DIARRHEA-ONCE A WEEK. BMs REGULAR. PASSES GAS FREQUENTLY. CONTINUES WITH A  COUGH SINCE THANKSGIVING. FEELS TIGHT IN HER THROAT. HIPS ARE NO BETTER SINCE GOT SHOTS. TRIED A BATH WITH EPSON SALT. HELPED 10%. CAN SHE TAKE FISH OIL? NO ETOH AT ALL, NOT EVEN COUGH SYRUP, NO WAY. SEEING A THERAPIST AND SHE IS EXCELLENT. AROUND HER ANKLES SHE ITCHES LIKE AN MF. CONCERNED ABOUT HER LEXAPRO NOT WORKING. NO BLOOD IN STOOL. Sinus drainage and throwing up in the AM-Rare.   Past Medical History  Diagnosis Date  . Brain aneurysm   . IBS (irritable bowel syndrome)   . GERD (gastroesophageal reflux disease)   . Panic attack   . Anxiety   . HTN (hypertension)   . Psoriatic arthritis   . ETOH abuse     Quit in 07/2011   . Sjogren's disease   . Cirrhosis     Presumably alcoholic cirrhosis  . Complication of anesthesia   . PONV (postoperative nausea and vomiting)     Past Surgical History  Procedure Date  . Cerebral aneurysm repair   . Esophageal biopsy 10/13/2011    SLF: mild gastritis/duodenal polypoid lesion   . Colonoscopy 10/13/2011    [id number] hemorrhoids/varices rectal/lesion in ascending colon(HYPERPLASTIC POLYP)    Allergies  Allergen Reactions  . Omeprazole Shortness Of Breath  . Phenytoin Rash  . Cephalexin Other (See Comments)    Severe yeast infection   Current Outpatient Prescriptions  Medication Sig Dispense Refill  . bumetanide (BUMEX) 1 MG tablet 1 PO bid    . escitalopram (LEXAPRO) 10 MG tablet Take 10 mg by mouth daily.      Marland Kitchen esomeprazole (NEXIUM) 40 MG capsule Take 1 capsule (40 mg total) by mouth daily before breakfast.    . folic acid (FOLVITE) 1 MG tablet Take 1 tablet (1 mg total) by mouth daily.    Marland Kitchen NASONEX 50 MCG/ACT nasal spray Place 2 sprays into the nose  as needed. For allergies    . Olopatadine HCl (PATANASE NA) Place 1 spray into the nose 2 (two) times daily.    . ondansetron (ZOFRAN) 4 MG tablet Take 1 tablet (4 mg total) by mouth every 6 (six) hours as needed for nausea.    . potassium chloride (K-DUR) 10 MEQ tablet 2 PO DAILY    . rifaximin (XIFAXAN) 550 MG TABS Take 1 tablet (550 mg total) by mouth 2 (two) times daily.    . Simethicone (GAS-X EXTRA STRENGTH) 125 MG CAPS Take 1 capsule by mouth daily.      Marland Kitchen spironolactone (ALDACTONE) 100 MG tablet Take 100 mg by mouth 2 (two) times daily. 1 PO tid      . Tetrahydrozoline HCl (VISINE OP) Apply 2 drops to eye 2 (two) times daily.           Review of Systems     Objective:   Physical Exam  Vitals reviewed. Constitutional: She is oriented to person, place, and time. She appears well-nourished. No distress.  HENT:  Head: Normocephalic and atraumatic.  Mouth/Throat: Oropharynx is clear and moist. No oropharyngeal exudate.  Eyes: Pupils are equal, round, and reactive to light. Scleral icterus (mild) is present.  Neck: Normal range of motion. Neck supple.  Cardiovascular: Normal rate, regular rhythm and normal  heart sounds.   Pulmonary/Chest: Effort normal and breath sounds normal. No respiratory distress.  Abdominal: Soft. Bowel sounds are normal. She exhibits distension. There is tenderness.  Musculoskeletal: Normal range of motion. She exhibits no edema.  Neurological: She is alert and oriented to person, place, and time.       NO FOCAL DEFICITS   Psychiatric: She has a normal mood and affect.       CRACKING JOKES          Assessment & Plan:

## 2012-04-07 NOTE — Assessment & Plan Note (Signed)
Labs improved. Complicated by recurrent ascites.   CONTINUE XIFAXAN, BUMEX, AND ALDACTONE.  CONTINUE A LOW SODIUM DIET.  GET LABS DRAWN FEB 2014 FOLLOW UP IN APR 2014.

## 2012-04-07 NOTE — Assessment & Plan Note (Signed)
WEIGHT UP 7 LBS. PT NOT CLOSELY WATCHING Na. CONTINUE ON DIURETICS.  WATCH Na. U/S GUIDED PARACENTESIS WITHIN THE NEXT 24 HRS. HOLD DIURETIC ON DAY OF PARACENTESIS. OPV IN 3 MOS.

## 2012-04-07 NOTE — Progress Notes (Signed)
Faxed to PCP

## 2012-04-07 NOTE — Patient Instructions (Addendum)
MOISTURIZE YOUR ANKLES.    YOUR FLUID WILL BE DRAWN OFF WITHIN THE NEXT 24 HOURS.  CONTINUE XIFAXAN, BUMEX, AND ALDACTONE. HOLD ON DAY OF PARACENTESIS.  CONTINUE A LOW SODIUM DIET.   GET LABS IN FEB 2014.  FOLLOW UP IN APR 2014.

## 2012-04-08 ENCOUNTER — Ambulatory Visit (HOSPITAL_COMMUNITY)
Admission: RE | Admit: 2012-04-08 | Discharge: 2012-04-08 | Disposition: A | Discharge status: Home / Self Care | Payer: BC Managed Care – PPO | Source: Ambulatory Visit | Attending: Gastroenterology | Admitting: Gastroenterology

## 2012-04-08 VITALS — BP 123/71

## 2012-04-08 DIAGNOSIS — R188 Other ascites: Secondary | ICD-10-CM | POA: Insufficient documentation

## 2012-04-08 DIAGNOSIS — K7460 Unspecified cirrhosis of liver: Secondary | ICD-10-CM | POA: Insufficient documentation

## 2012-04-08 LAB — BODY FLUID CELL COUNT WITH DIFFERENTIAL
Lymphs, Fluid: 5 %
Monocyte-Macrophage-Serous Fluid: 91 % — ABNORMAL HIGH (ref 50–90)
Neutrophil Count, Fluid: 4 % (ref 0–25)
Total Nucleated Cell Count, Fluid: 118 cu mm (ref 0–1000)

## 2012-04-08 MED ORDER — ALBUMIN HUMAN 25 % IV SOLN
INTRAVENOUS | Status: AC
  Administered 2012-04-08: 50 g via INTRAVENOUS
  Filled 2012-04-08: qty 200

## 2012-04-08 MED ORDER — ALBUMIN HUMAN 25 % IV SOLN
50.0000 g | Freq: Once | INTRAVENOUS | Status: AC
  Administered 2012-04-08: 50 g via INTRAVENOUS
  Filled 2012-04-08: qty 200

## 2012-04-08 NOTE — Progress Notes (Signed)
Lidocaine 1%        8mL injected                        abdominal fluid removed         IV started in right arm with 20G angiocath                Albumin 50g administered via IV per MD order

## 2012-04-08 NOTE — Procedures (Signed)
PreOperative Dx: Cirrhosis, ascites Postoperative Dx: Cirrhosis, ascites Procedure:   US guided paracentesis Radiologist:  Tyron Russell Anesthesia:  8 ml of 1% lidocaine Specimen:  6180 ml of clear yellow fluid EBL:   < 1 ml Complications: None

## 2012-04-11 ENCOUNTER — Telehealth: Payer: Self-pay | Admitting: Gastroenterology

## 2012-04-11 LAB — PATHOLOGIST SMEAR REVIEW

## 2012-04-11 NOTE — Progress Notes (Signed)
Pt is aware of OV on 4/3 at 2 with [facility]

## 2012-04-11 NOTE — Telephone Encounter (Signed)
Called and informed pt. Told her if I hear from Dr. Darrick Penna in reference to the nodule I will give her a call.

## 2012-04-11 NOTE — Telephone Encounter (Signed)
PLEASE CALL PT.  IT IS ROUTINE NOT TO REMOVE 10 LITERS OF FLUID AT A TIME. IF YOU REMOVE TOO MUCH FLUID IT CAUSE LOW BLOOD PRESSURE AND IT CAN CAUSE KIDNEY FAILURE. SHE SHOULD CONTINUE TO WATCH THE SALT IN HER DIET AND MONITOR HER WATER INTAKE. SHE MAY REABSORB THE REMAINING FLUID. SHE SHOULD MONITOR HER WEIGHT DAILY AND CALL ME IF IT GOES UP 5 LBS.

## 2012-04-11 NOTE — Telephone Encounter (Signed)
Pt called to speak with DS or SF. I told her that SF was not in the office and DS would have to call her back. [phone]

## 2012-04-11 NOTE — Telephone Encounter (Signed)
I called the pt and she said the paracentesis went well. She had not problems with it at all. She did have a question. She said she heard them say that they were drawing 6L  Of fluid and that she had about 4L left, but since it was the first time, they would not take it all. She wants to know if Dr. Darrick Penna thinks it would be OK to go back and remove that also. She just wants to know a little more about the normal procedure.  Also, she has a nodule on pubic area about the size of an egg, ( maybe slightly smaller) . She is not sure how long it has been there. She has just noticed it. It is tender but not painful. She said that it is not near the injection area. She just wants to know if Dr. Darrick Penna has any idea what it might be. She has an appt with Dr. Despina Hidden tomorrow and she can discuss it with him.

## 2012-04-11 NOTE — Telephone Encounter (Signed)
Any recommendations on the nodule?

## 2012-04-12 LAB — CULTURE, ROUTINE-ABSCESS

## 2012-04-12 NOTE — Telephone Encounter (Signed)
Pt called back. She saw Dr. Despina Hidden this Am and he said she has an inguinal hernia. He wants to refer her to a surgeon, but he said that he wants to wait until her INR is stable enough. She said he told her to discuss with Dr. Darrick Penna. He would like to refer her as soon as possible. Said she will need surgery, that it will keep growing. She can be reached at home.

## 2012-04-12 NOTE — Telephone Encounter (Signed)
Pt had called while I was out to lunch, I returned her call and LMOM to call.

## 2012-04-13 NOTE — Telephone Encounter (Signed)
Called patient TO DISCUSS RESULTS. SAW DR. Despina Hidden. HE DISCOVERED AN INGUINAL HERNIA. REVIEWED LABS WITH PT. AWARE OF PLT CT 107K AND INR 1.6. AGAIN  RE-ITERATED WHY ONLY 6lS PULLED OFF.

## 2012-04-14 ENCOUNTER — Ambulatory Visit: Payer: BC Managed Care – PPO | Admitting: Gastroenterology

## 2012-04-18 ENCOUNTER — Telehealth: Payer: Self-pay | Admitting: Gastroenterology

## 2012-04-18 NOTE — Telephone Encounter (Signed)
Pt called today to let SF know that she has a consult on 04/25/12 with a surgeon regarding her hernia.

## 2012-04-18 NOTE — Telephone Encounter (Signed)
REVIEWED.  

## 2012-04-19 ENCOUNTER — Telehealth: Payer: Self-pay

## 2012-04-19 NOTE — Telephone Encounter (Signed)
CALLED PT TO DISCUSS. GOT DISCONNECTED. CALLED BACK. LVM.  AS OF NOV 2013: PT'S ORIGINAL MELD SCORE IS 9. (original version of the MELD score as developed by investigators at [facility]) MORTALITY RISK CALCULATED: 2% AT 7 DAYS, 7 % AT 30 DAYS AND 10% AT 90 DAYS.  UNOS MELD score for organ allocation-17

## 2012-04-19 NOTE — Telephone Encounter (Signed)
Pt came by to get copies of recent labs. She has an appt at [facility] on 04/25/2012, for the inguinal hernia. She said some folks are trying to talk her out of surgery, but she would like to get it done before the hernia gets larger. She also thought that she had put on a few pounds. I had her get on the scales and she weighed 204.8 lbs which is actually less than the 211 lbs at her OV on 04/07/2012. She seems very anxious and just wants to make sure that Dr. Darrick Penna thinks she is able to have the surgery.

## 2012-04-21 ENCOUNTER — Encounter (INDEPENDENT_AMBULATORY_CARE_PROVIDER_SITE_OTHER): Payer: Self-pay | Admitting: General Surgery

## 2012-04-25 ENCOUNTER — Encounter (INDEPENDENT_AMBULATORY_CARE_PROVIDER_SITE_OTHER): Payer: Self-pay | Admitting: General Surgery

## 2012-04-25 ENCOUNTER — Ambulatory Visit (INDEPENDENT_AMBULATORY_CARE_PROVIDER_SITE_OTHER): Payer: BC Managed Care – PPO | Admitting: General Surgery

## 2012-04-25 VITALS — BP 124/72 | HR 76 | Temp 98.6°F | Resp 18 | Ht 67.0 in | Wt 203.0 lb

## 2012-04-25 DIAGNOSIS — K4090 Unilateral inguinal hernia, without obstruction or gangrene, not specified as recurrent: Secondary | ICD-10-CM

## 2012-04-25 NOTE — Progress Notes (Signed)
Patient ID: Deanna Thompson, female   DOB: 1955-10-29, 57 y.o.   MRN: [id number]  Chief Complaint  Patient presents with  . Inguinal Hernia    Deanna pt- eval RIH   HPI Deanna Thompson is a 57 y.o. female.  The patient is a 57 year old female who was referred by Dr.Eure for a right inguinal hernia. The patient has a history of alcoholic cirrhosis. She currently sees Dr. Darrick Penna whose father for this. She states that the hernia there for several years, and barely bothers her at this point. There's no pain involved. She said has had symptoms of incarceration. Patient has required paracentesis in the recent past. HPI  Past Medical History  Diagnosis Date  . Brain aneurysm   . IBS (irritable bowel syndrome)   . GERD (gastroesophageal reflux disease)   . Panic attack   . Anxiety   . Psoriatic arthritis   . ETOH abuse     Quit in 07/2011   . Sjogren's disease   . Cirrhosis     Presumably alcoholic cirrhosis  . Complication of anesthesia   . PONV (postoperative nausea and vomiting)   . HTN (hypertension)     no longer medicated, dated on 04/25/12    Past Surgical History  Procedure Date  . Cerebral aneurysm repair   . Esophageal biopsy 10/13/2011    SLF: mild gastritis/duodenal polypoid lesion   . Colonoscopy 10/13/2011    [id number] hemorrhoids/varices rectal/lesion in ascending colon(HYPERPLASTIC POLYP)    Family History  Problem Relation Age of Onset  . Cirrhosis Mother     nash  . Colon cancer Neg Hx   . Inflammatory bowel disease Neg Hx   . Asthma    . Heart disease    . COPD Father   . Heart disease Father     Social History History  Substance Use Topics  . Smoking status: Former Smoker -- 2.0 packs/day for 12 years    Types: Cigarettes    Quit date: 03/31/1995  . Smokeless tobacco: Not on file     Comment: quit in 1997  . Alcohol Use: No     Comment: Bottle of wine a day for years, but quit in 07/2011    Allergies  Allergen Reactions  . Omeprazole  Shortness Of Breath  . Phenytoin Rash  . Cephalexin Other (See Comments)    Severe yeast infection  . Phenergan (Promethazine Hcl)     Current Outpatient Prescriptions  Medication Sig Dispense Refill  . bumetanide (BUMEX) 1 MG tablet 1 PO bid  62 tablet  11  . escitalopram (LEXAPRO) 10 MG tablet Take 10 mg by mouth daily.        Marland Kitchen esomeprazole (NEXIUM) 40 MG capsule Take 1 capsule (40 mg total) by mouth daily before breakfast.  31 capsule  11  . folic acid (FOLVITE) 1 MG tablet Take 1 tablet (1 mg total) by mouth daily.  30 tablet  5  . NASONEX 50 MCG/ACT nasal spray Place 2 sprays into the nose as needed. For allergies      . rifaximin (XIFAXAN) 550 MG TABS Take 1 tablet (550 mg total) by mouth 2 (two) times daily.  62 tablet  11  . Simethicone (GAS-X EXTRA STRENGTH) 125 MG CAPS Take 1 capsule by mouth daily.      Marland Kitchen spironolactone (ALDACTONE) 100 MG tablet Take 100 mg by mouth 2 (two) times daily. 1 PO tid      . Tetrahydrozoline HCl (VISINE OP) Apply 2  drops to eye 2 (two) times daily.        Review of Systems Review of Systems  Constitutional: Negative.   HENT: Negative.   Respiratory: Negative.   Cardiovascular: Negative.   Gastrointestinal: Positive for abdominal pain and abdominal distention.  Musculoskeletal: Negative.   Neurological: Negative.     Blood pressure 124/72, pulse 76, temperature 98.6 F (37 C), temperature source Temporal, resp. rate 18, height 5\' 7"  (1.702 m), weight 203 lb (92.08 kg).  Physical Exam Physical Exam  Constitutional: She is oriented to person, place, and time. She appears well-developed and well-nourished.  HENT:  Head: Normocephalic and atraumatic.  Eyes: Conjunctivae normal and EOM are normal. Pupils are equal, round, and reactive to light.  Neck: Normal range of motion. Neck supple.  Cardiovascular: Normal rate, regular rhythm and normal heart sounds.   Pulmonary/Chest: Effort normal and breath sounds normal.  Abdominal: Soft. Bowel  sounds are normal. She exhibits distension (ascitic wave). A hernia is present. Hernia confirmed positive in the right inguinal area (recuible).  Musculoskeletal: Normal range of motion.  Neurological: She is alert and oriented to person, place, and time.    Data Reviewed none  Assessment    57 year old female with right inguinal reducible hernia. Patient also with ascites.    Plan    1. I discussed with the patient that due to her medical issues to include cirrhosis and ascites, at this point I would not recommend hernia repair of her umbilicus or right inguinal hernia.  We discussed signs symptoms of incarceration.  We did we did discuss the possibility of using a truss to help with symptoms.  2. Patient to followup when necessary      Marigene Ehlers., [facility] 04/25/2012, 10:49 AM

## 2012-04-27 ENCOUNTER — Telehealth: Payer: Self-pay

## 2012-04-27 NOTE — Telephone Encounter (Signed)
PLEASE CALL PT.  SHE MAY PICK UP RX FOR HEP A/B VACCINE. RECOMMENDED PT START SERIES IN MAY 2013.

## 2012-04-27 NOTE — Telephone Encounter (Signed)
Pt called and said she just finished her 3rd in the series of Hep B injections. She said she had to pay $89.00 each, and insurance would not refund because she did not have anything to say it was medically necessary. She wants to know if Dr. Darrick Penna could write a note saying that it was medically necessary. Please advise!

## 2012-04-28 NOTE — Telephone Encounter (Signed)
Pt is aware. Note is at the front for pick up.

## 2012-04-30 HISTORY — PX: PARACENTESIS: SHX844

## 2012-05-18 ENCOUNTER — Telehealth: Payer: Self-pay

## 2012-05-18 NOTE — Telephone Encounter (Signed)
Pt called and said that she has some questions in regard to her MELD score etc. She said the doctor she saw in Jan really frightened her. She will schedule an OV if needed, Also, She has not received any lab orders for Feb. She is aware that Dr. Darrick Penna is out of office/hospital until tomorrow.

## 2012-05-19 ENCOUNTER — Encounter: Payer: Self-pay | Admitting: Gastroenterology

## 2012-05-19 ENCOUNTER — Ambulatory Visit (INDEPENDENT_AMBULATORY_CARE_PROVIDER_SITE_OTHER): Payer: BC Managed Care – PPO | Admitting: Gastroenterology

## 2012-05-19 VITALS — BP 132/76 | HR 83 | Temp 98.4°F | Ht 67.0 in | Wt 207.4 lb

## 2012-05-19 DIAGNOSIS — R188 Other ascites: Secondary | ICD-10-CM

## 2012-05-19 DIAGNOSIS — K7460 Unspecified cirrhosis of liver: Secondary | ICD-10-CM

## 2012-05-19 DIAGNOSIS — K7010 Alcoholic hepatitis without ascites: Secondary | ICD-10-CM

## 2012-05-19 DIAGNOSIS — K635 Polyp of colon: Secondary | ICD-10-CM

## 2012-05-19 DIAGNOSIS — D126 Benign neoplasm of colon, unspecified: Secondary | ICD-10-CM

## 2012-05-19 NOTE — Telephone Encounter (Signed)
Called patient TO DISCUSS CONCERNS. LVM-CALL [phone] TO DISCUSS.  

## 2012-05-19 NOTE — Progress Notes (Signed)
  Subjective:    Patient ID: Deanna Thompson, female    DOB: [birth year], 56 y.o.   MRN: [id number]  PCP: FUSCO  HPI SAW DR. Timothy Lasso. CONCERNED ABOUT FLUID IN ABD, MELD SCORE 17, NEEDS PICTURE FOR HER LIVER. STILL HAS FLUID IN HER BELLY. NO ABD PAIN OR SWELLING, FEVER OR CHILLS. MILD SOB. TRYING TO STAY AWAY FROM SALT. ADDING LESS SALT BUT EATING OUT MORE.  Past Medical History  Diagnosis Date  . Brain aneurysm   . IBS (irritable bowel syndrome)   . GERD (gastroesophageal reflux disease)   . Panic attack   . Anxiety   . Psoriatic arthritis   . ETOH abuse     Quit in 07/2011   . Sjogren's disease   . Cirrhosis     Presumably alcoholic cirrhosis  . Complication of anesthesia   . PONV (postoperative nausea and vomiting)   . HTN (hypertension)     no longer medicated, dated on 04/25/12   Past Surgical History  Procedure Laterality Date  . Cerebral aneurysm repair    . Esophageal biopsy  10/13/2011    SLF: mild gastritis/duodenal polypoid lesion   . Colonoscopy  10/13/2011    [id number] hemorrhoids/varices rectal/lesion in ascending colon(HYPERPLASTIC POLYP)   Allergies  Allergen Reactions  . Omeprazole Shortness Of Breath  . Phenytoin Rash  . Cephalexin Other (See Comments)    Severe yeast infection  . Phenergan (Promethazine Hcl)    Current Outpatient Prescriptions  Medication Sig Dispense Refill  . bumetanide (BUMEX) 1 MG tablet 1 PO bid    . escitalopram (LEXAPRO) 10 MG tablet Take 10 mg by mouth daily.      Marland Kitchen esomeprazole (NEXIUM) 40 MG capsule Take 1 capsule (40 mg total) by mouth daily before breakfast.    . folic acid (FOLVITE) 1 MG tablet Take 1 tablet (1 mg total) by mouth daily.    Marland Kitchen NASONEX 50 MCG/ACT nasal spray Place 2 sprays into the nose as needed. For allergies    . rifaximin (XIFAXAN) 550 MG TABS Take 1 tablet (550 mg total) by mouth 2 (two) times daily.    . Simethicone (GAS-X EXTRA STRENGTH) 125 MG CAPS Take 1 capsule by mouth daily.      Marland Kitchen  spironolactone (ALDACTONE) 100 MG tablet Take 100 mg by mouth 2 (two) times daily. 1 PO tid      . Tetrahydrozoline HCl (VISINE OP) Apply 2 drops to eye 2 (two) times daily.             Review of Systems     Objective:   Physical Exam  Vitals reviewed. Constitutional: She is oriented to person, place, and time. No distress.  HENT:  Head: Normocephalic and atraumatic.  Mouth/Throat: Oropharynx is clear and moist.  Eyes: Pupils are equal, round, and reactive to light. No scleral icterus.  Neck: Neck supple.  Cardiovascular: Normal rate, regular rhythm and normal heart sounds.   Pulmonary/Chest: Effort normal and breath sounds normal. No respiratory distress.  Abdominal: Soft. Bowel sounds are normal. She exhibits distension (SEVERE). There is no tenderness. There is no rebound and no guarding.  Musculoskeletal: She exhibits no edema.  Lymphadenopathy:    She has no cervical adenopathy.  Neurological: She is alert and oriented to person, place, and time.  NO FOCAL DEFICITS   Psychiatric: She has a normal mood and affect.          Assessment & Plan:

## 2012-05-19 NOTE — Assessment & Plan Note (Signed)
LIVER ENZYMES IMPROVING.  LABS TODAY.

## 2012-05-19 NOTE — Progress Notes (Signed)
Faxed to PCP

## 2012-05-19 NOTE — Telephone Encounter (Signed)
Pt had OV today at 2:00 PM.

## 2012-05-19 NOTE — Assessment & Plan Note (Signed)
MOST LIKELY 6-8LS IN ABDOMEN.  LVP TOMORROW.

## 2012-05-19 NOTE — Assessment & Plan Note (Signed)
STILL WITH SIGNIFICANT ASCITES  ABDOMINAL ULTRASOUND AND PARACENTESIS WITHIN THE NEXT 24 HOURS.   CONTINUE XIFAXAN,BUMEX, AND ALDACTONE . HOLD BUMEX, AND ALDACTONE ON DAY OF PARACENTESIS.   CONTINUE A LOW SODIUM DIET.   GET LABS DONE TODAY.  FOLLOW UP IN MAY 2014.

## 2012-05-19 NOTE — Assessment & Plan Note (Signed)
WILL SCHEDULE COLONOSCOPY AFTER LAB RESULTS ARE KNOWN.

## 2012-05-19 NOTE — Patient Instructions (Addendum)
YOU WILL HAVE AN ABDOMINAL ULTRASOUND AND YOUR FLUID WILL BE DRAWN OFF WITHIN THE NEXT 24 HOURS.   CONTINUE XIFAXAN,BUMEX, AND ALDACTONE . HOLD BUMEX, AND ALDACTONE ON DAY OF PARACENTESIS.   CONTINUE A LOW SODIUM DIET.   GET LABS DONE TODAY.  I WILL SCHEDULE YOU COLONOSCOPY AFTER LAB RESULTS ARE KNOWN.  FOLLOW UP IN MAY 2014.

## 2012-05-20 ENCOUNTER — Ambulatory Visit (HOSPITAL_COMMUNITY)
Admission: RE | Admit: 2012-05-20 | Discharge: 2012-05-20 | Disposition: A | Discharge status: Home / Self Care | Payer: BC Managed Care – PPO | Source: Ambulatory Visit | Attending: Gastroenterology | Admitting: Gastroenterology

## 2012-05-20 DIAGNOSIS — K7460 Unspecified cirrhosis of liver: Secondary | ICD-10-CM

## 2012-05-20 DIAGNOSIS — R188 Other ascites: Secondary | ICD-10-CM | POA: Insufficient documentation

## 2012-05-20 DIAGNOSIS — K7689 Other specified diseases of liver: Secondary | ICD-10-CM | POA: Insufficient documentation

## 2012-05-20 DIAGNOSIS — K8020 Calculus of gallbladder without cholecystitis without obstruction: Secondary | ICD-10-CM | POA: Insufficient documentation

## 2012-05-20 LAB — CBC WITH DIFFERENTIAL/PLATELET
Eosinophils Absolute: 0.3 10*3/uL (ref 0.0–0.7)
Eosinophils Relative: 4 % (ref 0–5)
HCT: 33.3 % — ABNORMAL LOW (ref 36.0–46.0)
Lymphs Abs: 2.2 10*3/uL (ref 0.7–4.0)
MCH: 31.8 pg (ref 26.0–34.0)
MCV: 89.8 fL (ref 78.0–100.0)
Monocytes Absolute: 1 10*3/uL (ref 0.1–1.0)
Platelets: 222 10*3/uL (ref 150–400)
RBC: 3.71 MIL/uL — ABNORMAL LOW (ref 3.87–5.11)

## 2012-05-20 LAB — COMPLETE METABOLIC PANEL WITH GFR
ALT: 17 U/L (ref 0–35)
AST: 35 U/L (ref 0–37)
Albumin: 2.9 g/dL — ABNORMAL LOW (ref 3.5–5.2)
Alkaline Phosphatase: 89 U/L (ref 39–117)
Calcium: 8.7 mg/dL (ref 8.4–10.5)
Chloride: 101 mEq/L (ref 96–112)
Creat: 0.9 mg/dL (ref 0.50–1.10)
Potassium: 3.1 mEq/L — ABNORMAL LOW (ref 3.5–5.3)

## 2012-05-20 LAB — AFP TUMOR MARKER: AFP-Tumor Marker: 5.8 ng/mL (ref 0.0–8.0)

## 2012-05-20 MED ORDER — ALBUMIN HUMAN 25 % IV SOLN
INTRAVENOUS | Status: AC
  Administered 2012-05-20: 25 g via INTRAVENOUS
  Filled 2012-05-20: qty 100

## 2012-05-20 NOTE — Procedures (Signed)
Paracentesis performed under ultrasound. See radiology information system for full note.

## 2012-05-20 NOTE — Progress Notes (Signed)
Results faxed to Dr Sherwood Gambler

## 2012-05-20 NOTE — Progress Notes (Signed)
Lidocaine 1%        4mL injected                        10,613mL abdominal fluid removed          IV started in left antecubital with 24G angiocath              Albumin 50g administered IV per order

## 2012-05-20 NOTE — Progress Notes (Addendum)
Bilirubin improved. S/p 10.5 Ls LVP-MELD 16. INR REMAINS 1.65. U/S-NO FOCAL LESION, AFP 5.8  Called patient TO DISCUSS RESULTS. LVM-CALL [phone] TO DISCUSS.  FAX U/S

## 2012-05-26 ENCOUNTER — Other Ambulatory Visit: Payer: Self-pay | Admitting: Gastroenterology

## 2012-05-27 NOTE — Progress Notes (Signed)
Pt is aware of OV on 4/3 with SF

## 2012-06-01 ENCOUNTER — Telehealth: Payer: Self-pay | Admitting: Gastroenterology

## 2012-06-01 NOTE — Telephone Encounter (Signed)
LMOM to call.

## 2012-06-01 NOTE — Telephone Encounter (Signed)
    [place], MD at 05/20/2012 3:22 PM    Status: Addendum             Bilirubin improved. S/p 10.5 Ls LVP-MELD 16. INR REMAINS 1.65. U/S-NO FOCAL LESION, AFP 5.8  Called patient TO DISCUSS RESULTS. LVM-CALL [phone] TO DISCUSS.  FAX U/S

## 2012-06-01 NOTE — Telephone Encounter (Signed)
Results faxed to Dr Timothy Lasso

## 2012-06-01 NOTE — Telephone Encounter (Signed)
Pt returned call and was informed of results.  

## 2012-06-01 NOTE — Telephone Encounter (Signed)
PLEASE CALL PT. HER BILIRUBIN IS IMPROVED. IT WAS 4.1 IN NOV AND NOW IT'S 2.7. HER INR REMAINS AT 1.65. FOR LIVER TRANSPLANT EVAL HER MELD SCORE IS 16. HER U/S SHOWS CIRRHOSIS. HER AFP, WHICH IS THE TUMOR MARKER, IS NORMAL.

## 2012-06-01 NOTE — Telephone Encounter (Signed)
Routing to [employer] to fax.

## 2012-06-01 NOTE — Telephone Encounter (Signed)
Routing to Dr. Fields.  

## 2012-06-01 NOTE — Telephone Encounter (Signed)
Pt would like for the results to be faxed to Dr. Marguerita Merles in [place] at [phone].

## 2012-06-01 NOTE — Telephone Encounter (Signed)
Pt called to see if her results were available on her labs and sonogram that she had done last week. [phone]

## 2012-06-09 NOTE — Progress Notes (Signed)
REVIEWED.  

## 2012-06-13 ENCOUNTER — Telehealth: Payer: Self-pay

## 2012-06-13 NOTE — Telephone Encounter (Signed)
Needs urgent OV.

## 2012-06-13 NOTE — Telephone Encounter (Signed)
Pt called. Said her weight is at 197 lbs on her home scales. Her abdomen is distended. No edema in extremities. She has a little shortness of breath, but not bad. She has appt with Dr. Darrick Penna on 06/30/2012. She said she might be OK til then she wanted to [facility]. I told her Dr. Darrick Penna is not here today and I will check with extender and see what recommendations would be.  She said when she was here on 05/19/2012 she weighed 207. After the parentesis she weighed 180 lbs when she got home.   Please advise!

## 2012-06-13 NOTE — Telephone Encounter (Signed)
Pt aware of urgent appt with [facility] on 06/14/2012 at 11:30 AM.

## 2012-06-14 ENCOUNTER — Encounter: Payer: Self-pay | Admitting: Gastroenterology

## 2012-06-14 ENCOUNTER — Ambulatory Visit (INDEPENDENT_AMBULATORY_CARE_PROVIDER_SITE_OTHER): Payer: BC Managed Care – PPO | Admitting: Gastroenterology

## 2012-06-14 VITALS — BP 127/69 | HR 89 | Temp 97.4°F | Ht 66.0 in | Wt 199.6 lb

## 2012-06-14 DIAGNOSIS — R188 Other ascites: Secondary | ICD-10-CM

## 2012-06-14 DIAGNOSIS — K7460 Unspecified cirrhosis of liver: Secondary | ICD-10-CM

## 2012-06-14 LAB — CBC WITH DIFFERENTIAL/PLATELET
Eosinophils Relative: 4 % (ref 0–5)
HCT: 34.1 % — ABNORMAL LOW (ref 36.0–46.0)
Hemoglobin: 12.1 g/dL (ref 12.0–15.0)
Lymphocytes Relative: 32 % (ref 12–46)
Lymphs Abs: 2 10*3/uL (ref 0.7–4.0)
MCV: 90.5 fL (ref 78.0–100.0)
Monocytes Absolute: 0.6 10*3/uL (ref 0.1–1.0)
Monocytes Relative: 10 % (ref 3–12)
Neutro Abs: 3.3 10*3/uL (ref 1.7–7.7)
RBC: 3.77 MIL/uL — ABNORMAL LOW (ref 3.87–5.11)
RDW: 14.2 % (ref 11.5–15.5)
WBC: 6.3 10*3/uL (ref 4.0–10.5)

## 2012-06-14 NOTE — Patient Instructions (Addendum)
We have scheduled you for a paracentesis soon.   Please have blood work done today.   Continue to limit salt to 2 grams daily.   Do not take Bumex or Aldactone on the day of the paracentesis.  Keep your appointment with Dr. Darrick Penna in April.

## 2012-06-14 NOTE — Assessment & Plan Note (Signed)
57 year old female with ETOH cirrhosis, presenting with increased abdominal distension. On physical exam noted to have tense ascites. No lower extremity edema. She does admit to not taking her diuretics as prescribed due to a recent death in the family. Salt intake is not restricted as it should be, and she admits to this as well. Discussed importance of adherence to sodium restriction and diuretics.  Will proceed with LVAP tomorrow. Hold diuretics tomorrow for procedure. CBC, BMP, HFP, PT/INR now Albumin 25 mg IV if greater than 5 liters drawn off Keep appt in April with Dr. Darrick Penna.

## 2012-06-14 NOTE — Progress Notes (Signed)
Referring Provider: Elfredia Nevins, MD Primary Care Physician:  [name]., MD Primary Gastroenterologist: Dr. Darrick Penna   Chief Complaint  Patient presents with  . Follow-up    HPI:   [name] is a very pleasant 57 year old female who presents today as a work-in secondary to abdominal distension. She has a history significant for ETOH cirrhosis. Underwent LVAP Feb 2014 with approximately 10.7 liters withdrawn. Last weight 207 Feb 20th. She reports that after the paracentesis she weighed around 180. Currently now weighing 199. Has an umbilical and inguinal hernia. Trying to limit salt intake. Still thinks she is eating too much. Still eats out too much. She had a death in the family last week, and she admits that she has not been taking her medications as she should.  Notes LLQ is a little tender, lower abdomen mildly tender. Denies shortness of breath, but she feels like she can't get a full breath in. Feels more distended. +itching, chronic. No mental status changes. BM once per day. Used to be on lactulose, didn't tolerate. Taking Xifaxan BID.   Diuretics: Aldactone 100 mg po BID and Bumex 1 mg po BID.   Past Medical History  Diagnosis Date  . Brain aneurysm   . IBS (irritable bowel syndrome)   . GERD (gastroesophageal reflux disease)   . Panic attack   . Anxiety   . Psoriatic arthritis   . ETOH abuse     Quit in 07/2011   . Sjogren's disease   . Cirrhosis     Presumably alcoholic cirrhosis  . Complication of anesthesia   . PONV (postoperative nausea and vomiting)   . HTN (hypertension)     no longer medicated, dated on 04/25/12    Past Surgical History  Procedure Laterality Date  . Cerebral aneurysm repair    . Esophageal biopsy  10/13/2011    SLF: mild gastritis/duodenal polypoid lesion   . Colonoscopy  10/13/2011    JYN:[id number] hemorrhoids/varices rectal/lesion in ascending colon(HYPERPLASTIC POLYP)  . Paracentesis  Feb 2014    10.7 liters    Current  Outpatient Prescriptions  Medication Sig Dispense Refill  . bumetanide (BUMEX) 1 MG tablet 1 PO bid  62 tablet  11  . escitalopram (LEXAPRO) 10 MG tablet Take 20 mg by mouth daily.       Marland Kitchen esomeprazole (NEXIUM) 40 MG capsule Take 1 capsule (40 mg total) by mouth daily before breakfast.  31 capsule  11  . folic acid (FOLVITE) 1 MG tablet TAKE (1) TABLET BY MOUTH EACH MORNING.  30 tablet  3  . NASONEX 50 MCG/ACT nasal spray Place 2 sprays into the nose as needed. For allergies      . rifaximin (XIFAXAN) 550 MG TABS Take 1 tablet (550 mg total) by mouth 2 (two) times daily.  62 tablet  11  . Simethicone (GAS-X EXTRA STRENGTH) 125 MG CAPS Take 1 capsule by mouth daily.      Marland Kitchen spironolactone (ALDACTONE) 100 MG tablet Take 100 mg by mouth 2 (two) times daily. 1 PO tid      . Tetrahydrozoline HCl (VISINE OP) Apply 2 drops to eye 2 (two) times daily.       No current facility-administered medications for this visit.    Allergies as of 06/14/2012 - Review Complete 06/14/2012  Allergen Reaction Noted  . Omeprazole Shortness Of Breath 08/19/2011  . Phenytoin Rash   . Cephalexin Other (See Comments)   . Phenergan (promethazine hcl)  04/08/2012    Family History  Problem Relation Age of Onset  . Cirrhosis Mother     nash  . Colon cancer Neg Hx   . Inflammatory bowel disease Neg Hx   . Asthma    . Heart disease    . COPD Father   . Heart disease Father     History   Social History  . Marital Status: Married    Spouse Name: N/A    Number of Children: 0  . Years of Education: N/A   Occupational History  .     Social History Main Topics  . Smoking status: Former Smoker -- 2.00 packs/day for 12 years    Types: Cigarettes    Quit date: 03/31/1995  . Smokeless tobacco: None     Comment: quit in 1997  . Alcohol Use: No     Comment: Bottle of wine a day for years, but quit in 07/2011  . Drug Use: No  . Sexually Active: None   Other Topics Concern  . None   Social History  Narrative   Quit drinking alcohol 3 weeks ago. No drugs, no IVDU.     Review of Systems: Negative unless mentioned in HPI  Physical Exam: BP 127/69  Pulse 89  Temp(Src) 97.4 F (36.3 C) (Oral)  Ht 5\' 6"  (1.676 m)  Wt 199 lb 9.6 oz (90.538 kg)  BMI 32.23 kg/m2 General:   Alert and oriented. No distress noted. Pleasant and cooperative.  Head:  Normocephalic and atraumatic. Eyes:  Conjuctiva clear without scleral icterus. Mouth:  Oral mucosa pink and moist. Good dentition. No lesions. Heart:  S1, S2 present without murmurs, rubs, or gallops. Regular rate and rhythm. Abdomen:  +BS, tense ascites, distended. +umbilical hernia.  Msk:  Symmetrical without gross deformities. Normal posture. Extremities:  Without edema. Neurologic:  Alert and  oriented x4;  grossly normal neurologically. Skin:  Intact without significant lesions or rashes. Psych:  Alert and cooperative. Normal mood and affect.

## 2012-06-15 ENCOUNTER — Ambulatory Visit (HOSPITAL_COMMUNITY)
Admission: RE | Admit: 2012-06-15 | Discharge: 2012-06-15 | Disposition: A | Discharge status: Home / Self Care | Payer: BC Managed Care – PPO | Source: Ambulatory Visit | Attending: Gastroenterology | Admitting: Gastroenterology

## 2012-06-15 DIAGNOSIS — R188 Other ascites: Secondary | ICD-10-CM

## 2012-06-15 LAB — BASIC METABOLIC PANEL
BUN: 10 mg/dL (ref 6–23)
CO2: 27 mEq/L (ref 19–32)
Chloride: 100 mEq/L (ref 96–112)
Creat: 1 mg/dL (ref 0.50–1.10)
Glucose, Bld: 156 mg/dL — ABNORMAL HIGH (ref 70–99)
Potassium: 3.7 mEq/L (ref 3.5–5.3)

## 2012-06-15 LAB — HEPATIC FUNCTION PANEL
ALT: 20 U/L (ref 0–35)
AST: 44 U/L — ABNORMAL HIGH (ref 0–37)
Bilirubin, Direct: 0.9 mg/dL — ABNORMAL HIGH (ref 0.0–0.3)
Total Protein: 6.9 g/dL (ref 6.0–8.3)

## 2012-06-15 LAB — PROTIME-INR
INR: 1.55 — ABNORMAL HIGH (ref <1.50)
Prothrombin Time: 18.3 seconds — ABNORMAL HIGH (ref 11.6–15.2)

## 2012-06-15 MED ORDER — ALBUMIN HUMAN 25 % IV SOLN
INTRAVENOUS | Status: AC
  Administered 2012-06-15: 25 g via INTRAVENOUS
  Filled 2012-06-15: qty 100

## 2012-06-15 MED ORDER — ALBUMIN HUMAN 25 % IV SOLN
25.0000 g | Freq: Once | INTRAVENOUS | Status: AC
  Administered 2012-06-15: 25 g via INTRAVENOUS

## 2012-06-15 NOTE — Progress Notes (Signed)
Lidocaine 1%            4mL injected                        mL abdominal fluid removed                  IV started in left antecubital with 25G angiocath                          abdominal fluid removed

## 2012-06-15 NOTE — Procedures (Signed)
An ultrasound guided paracentesis was thoroughly discussed with the patient and questions answered.  The benefits, risks, alternatives and complications were also discussed.  The patient understands and wishes to proceed with the procedure.  Written consent was obtained.  Ultrasound was performed to localize and mark an adequate pocket of fluid in the right lower quadrant of the abdomen.  The area was then prepped and draped in the normal sterile fashion.  1% Lidocaine was used for local anesthesia.  Under ultrasound guidance a 19 gauge Yueh catheter was introduced.  Paracentesis was performed.  The catheter was removed and a dressing applied. A total of approximately 5.9 liters of yellowish fluid was removed.  A fluid sample was not sent for laboratory analysis. The patient's vital signs were evaluated throughout the examination and were stable.  25 grams of albumin was administered as per request of Dr. Luellen Pucker. No complications.

## 2012-06-20 ENCOUNTER — Telehealth (INDEPENDENT_AMBULATORY_CARE_PROVIDER_SITE_OTHER): Payer: Self-pay | Admitting: General Surgery

## 2012-06-20 NOTE — Telephone Encounter (Signed)
Returned patients call re: truss or the undergarment that she found...spoke with AR who advised that the undergarment that she found probably wouldn't give her as much support as the truss but she could do either one...truss being for female wouldn't really make any difference in that aspect..patient was pleased and said that she would go with the female truss then.Deanna Thompson

## 2012-06-20 NOTE — Telephone Encounter (Signed)
Patient called status post being evaluated for a hernia in January. She states she was given an RX at that time for a truss. She has recently started looking into it and states she can not find a company that makes a truss for women. She has tried [employer] and [employer] and a [employer] She states the closest they have is a type of underwear you wear over your underwear that is tighter. She is wondering if this would be okay and wants to get the okay from Dr Derrell Lolling before she spends money on this. Please advise.

## 2012-06-28 NOTE — Progress Notes (Signed)
Quick Note:  MELD 15.  Keep appt with SLF April 3. ______

## 2012-06-29 NOTE — Progress Notes (Signed)
Quick Note:  Called and informed pt. ______ 

## 2012-06-30 ENCOUNTER — Other Ambulatory Visit: Payer: Self-pay | Admitting: Gastroenterology

## 2012-06-30 ENCOUNTER — Ambulatory Visit (INDEPENDENT_AMBULATORY_CARE_PROVIDER_SITE_OTHER): Payer: BC Managed Care – PPO | Admitting: Gastroenterology

## 2012-06-30 ENCOUNTER — Encounter: Payer: Self-pay | Admitting: Gastroenterology

## 2012-06-30 VITALS — BP 145/74 | HR 84 | Temp 98.1°F | Ht 66.0 in | Wt 205.6 lb

## 2012-06-30 DIAGNOSIS — R188 Other ascites: Secondary | ICD-10-CM

## 2012-06-30 DIAGNOSIS — R21 Rash and other nonspecific skin eruption: Secondary | ICD-10-CM | POA: Insufficient documentation

## 2012-06-30 DIAGNOSIS — D126 Benign neoplasm of colon, unspecified: Secondary | ICD-10-CM

## 2012-06-30 DIAGNOSIS — K7460 Unspecified cirrhosis of liver: Secondary | ICD-10-CM

## 2012-06-30 DIAGNOSIS — K635 Polyp of colon: Secondary | ICD-10-CM

## 2012-06-30 NOTE — Assessment & Plan Note (Signed)
NO NEW MEDS-LIKELY DUE TO ECZEMA  TRY HCT CREAM TID AND IF NO RESOLUTION SEE DERMATOLOGY.

## 2012-06-30 NOTE — Assessment & Plan Note (Signed)
COMPLETE REMOVAL ON HOLD DUE TO RISK OF BLEEDING/ELEVATED INR  TCS TBS AFTER NEXT OPV

## 2012-06-30 NOTE — Assessment & Plan Note (Signed)
RECENT LABS MAR 2014 IMPROVING T BILI 2.6 BUT MOSTLY iDIRECT BILI(D BILI 0.9)-MELD 11-15, PARTIALLY COMPENSATED DISEASE  ABD PARACENTESIS TOMORROW REVIEWED LABS. OK TO HAVE ACUPUNCTURE AND SWEDISH MASSAGE. CONTINUE DIURETIC. ENCOURAGED FOLLOWING A LOW NA DIET OPV IN 3 MOS

## 2012-06-30 NOTE — Patient Instructions (Signed)
FOLLOW A LOW SALT DIET.  PARACENTESIS TOMORROW AT 1115 AM. ARRIVE AT 1045 AM.  HOLD DIURETICS ON  APR 4.  FOLLOW UP IN 3 MOS.   2 Gram Low Sodium Diet A 2 gram sodium diet restricts the amount of sodium in the diet to no more than 2 g or 2000 mg daily. Limiting the amount of sodium is often used to help lower blood pressure. It is important if you have heart, liver, or kidney problems. Many foods contain sodium for flavor and sometimes as a preservative. When the amount of sodium in a diet needs to be low, it is important to know what to look for when choosing foods and drinks. The following includes some information and guidelines to help make it easier for you to adapt to a low sodium diet.  QUICK TIPS  Do not add salt to food.   Avoid convenience items and fast food.   Choose unsalted snack foods.   Buy lower sodium products, often labeled as "lower sodium" or "no salt added."   Check food labels to learn how much sodium is in 1 serving.   When eating at a restaurant, ask that your food be prepared with less salt or none, if possible.   READING FOOD LABELS FOR SODIUM INFORMATION The nutrition facts label is a good place to find how much sodium is in foods. Look for products with no more than 500 to 600 mg of sodium per meal and no more than 150 mg per serving. Remember that 2 g = 2000 mg.  The food label may also list foods as:  Sodium-free: Less than 5 mg in a serving.   Very low sodium: 35 mg or less in a serving.   Low-sodium: 140 mg or less in a serving.   Light in sodium: 50% less sodium in a serving. For example, if a food that usually has 300 mg of sodium is changed to become light in sodium, it will have 150 mg of sodium.   Reduced sodium: 25% less sodium in a serving. For example, if a food that usually has 400 mg of sodium is changed to reduced sodium, it will have 300 mg of sodium.   CHOOSING FOODS Grains  Avoid: Salted crackers and snack items. Some cereals,  including instant hot cereals. Bread stuffing and biscuit mixes. Seasoned rice or pasta mixes.   Choose: Unsalted snack items. Low-sodium cereals, oats, puffed wheat and rice, shredded wheat. English muffins and bread. Pasta.  Meats  Avoid: Salted, canned, smoked, spiced, pickled meats, including fish and poultry. Bacon, ham, sausage, cold cuts, hot dogs, anchovies.   Choose: Low-sodium canned tuna and salmon. Fresh or frozen meat, poultry, and fish.  Dairy  Avoid: Processed cheese and spreads. Cottage cheese. Buttermilk and condensed milk. Regular cheese.   Choose: Milk. Low-sodium cottage cheese. Yogurt. Sour cream. Low-sodium cheese.  Fruits and Vegetables  Avoid: Regular canned vegetables. Regular canned tomato sauce and paste. Frozen vegetables in sauces. Olives. Rosita Fire. Relishes. Sauerkraut.   Choose: Low-sodium canned vegetables. Low-sodium tomato sauce and paste. Frozen or fresh vegetables. Fresh and frozen fruit.  Condiments  Avoid: Canned and packaged gravies. Worcestershire sauce. Tartar sauce. Barbecue sauce. Soy sauce. Steak sauce. Ketchup. Onion, garlic, and table salt. Meat flavorings and tenderizers.   Choose: Fresh and dried herbs and spices. Low-sodium varieties of mustard and ketchup. Lemon juice. Tabasco sauce. Horseradish.  SAMPLE 2 GRAM SODIUM MEAL PLAN Breakfast / Sodium (mg)  1 cup low-fat milk / 143  mg   2 slices whole-wheat toast / 270 mg   1 tbs heart-healthy margarine / 153 mg   1 hard-boiled egg / 139 mg   1 small orange / 0 mg  Lunch / Sodium (mg)  1 cup raw carrots / 76 mg    cup hummus / 298 mg   1 cup low-fat milk / 143 mg    cup red grapes / 2 mg   1 whole-wheat pita bread / 356 mg  Dinner / Sodium (mg)  1 cup whole-wheat pasta / 2 mg   1 cup low-sodium tomato sauce / 73 mg   3 oz lean ground beef / 57 mg   1 small side salad (1 cup raw spinach leaves,  cup cucumber,  cup yellow bell pepper) with 1 tsp olive oil and 1 tsp  red wine vinegar / 25 mg  Snack / Sodium (mg)  1 container low-fat vanilla yogurt / 107 mg   3 graham cracker squares / 127 mg  Nutrient Analysis  Calories: 2033   Protein: 77 g   Carbohydrate: 282 g   Fat: 72 g   Sodium: 1971 mg

## 2012-06-30 NOTE — Assessment & Plan Note (Addendum)
NOT WELL CONTROLLED-6-8 LS IN ABDOMEN  FOLLOW A LOW SALT DIET LV PARACENTESIS TOMORROW AT 1115 AM ALBUMIN 12.5 GMS IV AT ONSET AND REPEAT AT 4LS FOR TOTAL OF 25 GMS HOLD DIURETIC APR 4 IF BUMEX D/C WOULD CONSIDER HCTZ. DISCUSSED WITH PHARMACY([facility]) OPV IN 3 MOS

## 2012-06-30 NOTE — Progress Notes (Signed)
CC PCP 

## 2012-06-30 NOTE — Progress Notes (Signed)
Subjective:    Patient ID: Deanna Thompson, female    DOB: [birth year], 56 y.o.   MRN: [id number]  PCP: Sherwood Gambler  HPI Estranged sister died recently. Had to put her dog down. Resulted in feeling severely depressed. J Suarez doubled LEXAPRO. DIDN'T START WELLBUTRIN. SLEEPY ALL THE TIME BUT THINKS IT'S PARTLY DEPRESSION. STILL SWOLLEN IN THE BELLY. TRYING TO FOLLOW A LOW SALT DIET. NO SWELLING IN HER LEGS. WANTS ME TO CHECK HER AREA BEHIND HER LEFT EAR. HAVING TROUBLE WITH PAIN IN HER BURSA AND ARTHRITIS. TAYLOR DOES ACUPUNCTURE. MILD ABD DISCOMFORT IN LLQ AND RLQ. NO ETOH. SEEING THERAPIST TOMORROW.   Past Medical History  Diagnosis Date  . Brain aneurysm   . IBS (irritable bowel syndrome)   . GERD (gastroesophageal reflux disease)   . Panic attack   . Anxiety   . Psoriatic arthritis   . ETOH abuse     Quit in 07/2011   . Sjogren's disease   . Cirrhosis     Presumably alcoholic cirrhosis  . Complication of anesthesia   . PONV (postoperative nausea and vomiting)   . HTN (hypertension)     no longer medicated, dated on 04/25/12   Past Surgical History  Procedure Laterality Date  . Cerebral aneurysm repair    . Esophageal biopsy  10/13/2011    SLF: mild gastritis/duodenal polypoid lesion   . Colonoscopy  10/13/2011    [id number] hemorrhoids/varices rectal/lesion in ascending colon(HYPERPLASTIC POLYP)  . Paracentesis  Feb 2014    10.7 liters   Past Surgical History  Procedure Laterality Date  . Cerebral aneurysm repair    . Esophageal biopsy  10/13/2011    SLF: mild gastritis/duodenal polypoid lesion   . Colonoscopy  10/13/2011    [id number] hemorrhoids/varices rectal/lesion in ascending colon(HYPERPLASTIC POLYP)  . Paracentesis  Feb 2014    10.7 liters    Allergies  Allergen Reactions  . Omeprazole Shortness Of Breath  . Phenytoin Rash  . Cephalexin Other (See Comments)    Severe yeast infection  . Phenergan (Promethazine Hcl)     Current Outpatient Prescriptions   Medication Sig Dispense Refill  . bumetanide (BUMEX) 1 MG tablet 1 PO bid    . escitalopram (LEXAPRO) 10 MG tablet Take by mouth daily.     Marland Kitchen esomeprazole (NEXIUM) 40 MG capsule Take 1 capsule (40 mg total) by mouth daily before breakfast.    . folic acid (FOLVITE) 1 MG tablet TAKE (1) TABLET BY MOUTH EACH MORNING.    . NASONEX 50 MCG/ACT nasal spray Place 2 sprays into the nose as needed. For allergies    . rifaximin (XIFAXAN) 550 MG TABS Take 1 tablet (550 mg total) by mouth 2 (two) times daily.    . Simethicone (GAS-X EXTRA STRENGTH) 125 MG CAPS Take 1 capsule by mouth daily.      Marland Kitchen spironolactone (ALDACTONE) 100 MG tablet Take 100 mg by mouth 2 (two) times daily. 1 PO tid      . Tetrahydrozoline HCl (VISINE OP) Apply 2 drops to eye 2 (two) times daily.       Review of Systems     Objective:   Physical Exam  Vitals reviewed. Constitutional: She is oriented to person, place, and time. No distress.  HENT:  Head: Normocephalic and atraumatic.  Mouth/Throat: Oropharynx is clear and moist. No oropharyngeal exudate.  Eyes: Pupils are equal, round, and reactive to light. No scleral icterus.  Neck: Normal range of motion. Neck supple.  Cardiovascular: Normal rate, regular  rhythm and normal heart sounds.   Pulmonary/Chest: Effort normal and breath sounds normal. No respiratory distress.  Abdominal: Soft. Bowel sounds are normal. She exhibits distension. There is tenderness (MILD PINPOINT TTP IN RLQ/LLQ). There is no rebound and no guarding.  Musculoskeletal: She exhibits no edema.  Lymphadenopathy:    She has no cervical adenopathy.  Neurological: She is alert and oriented to person, place, and time.  NO FOCAL DEFICITS  Skin: Rash (ON ANKLES BIL AND ABD-MACULOPAPULAR IRREGULAR LESIONS) noted.  MULTIPLE SPIDER ANGIOMATA ON CHEST   Psychiatric: She has a normal mood and affect. Judgment and thought content normal.          Assessment & Plan:

## 2012-07-01 ENCOUNTER — Ambulatory Visit (HOSPITAL_COMMUNITY)
Admission: RE | Admit: 2012-07-01 | Discharge: 2012-07-01 | Disposition: A | Discharge status: Home / Self Care | Payer: BC Managed Care – PPO | Source: Ambulatory Visit | Attending: Gastroenterology | Admitting: Gastroenterology

## 2012-07-01 VITALS — BP 119/64 | HR 83 | Temp 98.1°F | Resp 18

## 2012-07-01 DIAGNOSIS — K7460 Unspecified cirrhosis of liver: Secondary | ICD-10-CM | POA: Insufficient documentation

## 2012-07-01 DIAGNOSIS — R188 Other ascites: Secondary | ICD-10-CM | POA: Insufficient documentation

## 2012-07-01 MED ORDER — ALBUMIN HUMAN 25 % IV SOLN
INTRAVENOUS | Status: AC
  Administered 2012-07-01: 12.5 g via INTRAVENOUS
  Filled 2012-07-01: qty 50

## 2012-07-01 NOTE — Progress Notes (Signed)
12,600 ml of clear yellow abdominal fluid removed from right lower abdomen.

## 2012-07-01 NOTE — Progress Notes (Signed)
Pt for right sided paracentesis albumin IV 12.5g given prior to procedure and 12.5g to be given after procedure. IV started 20ga in right AC. Lidocaine 1% 8ml used.

## 2012-07-01 NOTE — Procedures (Signed)
PreOperative Dx: Cirrhosis, ascites Postoperative Dx: Cirrhosis, ascites Procedure:   US guided paracentesis Radiologist:  Tyron Russell Anesthesia:  7 ml of 1% lidocaine Specimen:  12.6 L of clear yellow fluid EBL:   None Complications: None

## 2012-07-01 NOTE — Progress Notes (Signed)
IV removed from right AC. Catheter intact.

## 2012-07-05 NOTE — Progress Notes (Signed)
REMINDER MADE 

## 2012-07-13 ENCOUNTER — Other Ambulatory Visit: Payer: Self-pay | Admitting: Gastroenterology

## 2012-07-13 ENCOUNTER — Telehealth: Payer: Self-pay

## 2012-07-13 ENCOUNTER — Ambulatory Visit (HOSPITAL_COMMUNITY)
Admission: RE | Admit: 2012-07-13 | Discharge: 2012-07-13 | Disposition: A | Discharge status: Home / Self Care | Payer: BC Managed Care – PPO | Source: Ambulatory Visit | Attending: Gastroenterology | Admitting: Gastroenterology

## 2012-07-13 DIAGNOSIS — R188 Other ascites: Secondary | ICD-10-CM

## 2012-07-13 DIAGNOSIS — K7460 Unspecified cirrhosis of liver: Secondary | ICD-10-CM | POA: Insufficient documentation

## 2012-07-13 MED ORDER — ALBUMIN HUMAN 25 % IV SOLN
INTRAVENOUS | Status: AC
  Filled 2012-07-13: qty 100

## 2012-07-13 MED ORDER — ALBUMIN HUMAN 25 % IV SOLN
25.0000 g | Freq: Once | INTRAVENOUS | Status: DC
Start: 2012-07-13 — End: 2012-07-14

## 2012-07-13 NOTE — Telephone Encounter (Addendum)
PLEASE CALL PT. She needs a large volume paracentesis ASAP. SHE NEEDS ALBUMIN 12 GMS IV AT ONSET AND REPEAT AFTER 4 LS. SHE SHOULD HOLD HER DIURETICS TODAY. SHE NEEDS TO STRICTLY FOLLOW A LOW SALT DIET.

## 2012-07-13 NOTE — Telephone Encounter (Signed)
Paracentesis scheduled for today at 1:40 and Deanna Thompson is aware

## 2012-07-13 NOTE — Telephone Encounter (Signed)
Pt called to let us know how she is doing and ask for recommendations. She will be going out of town next week on Wed 4/23 and not return until 4/30. She said she has fluid and is getting a little uncomfortable. Although, she said she has no edema and no shortness of breath at this time. She said the last time she had fluid off it was 12 liters and it really took something out of her for a couple of days. The day after she weighed 178 lbs on her scales and today she weighs 192. Please advise!

## 2012-07-13 NOTE — Telephone Encounter (Signed)
Called and informed pt.  

## 2012-07-13 NOTE — Progress Notes (Signed)
Lidocaine 1%            7mL injected                        abdominal fluid removed               IV started with 20G angiocath         Albumin 25g administered via IV

## 2012-07-13 NOTE — Procedures (Signed)
PreOperative Dx: Cirrhosis, ascites Postoperative Dx: Cirrhosis, ascites Procedure:   US guided paracentesis Radiologist:  Tyron Russell Anesthesia:  7 ml of 1% lidocaine Specimen:  10.5 L of clear yellow fluid EBL:   < 1 ml Complications: None

## 2012-07-13 NOTE — Telephone Encounter (Signed)
REVIEWED.  

## 2012-07-26 ENCOUNTER — Telehealth: Payer: Self-pay

## 2012-07-26 ENCOUNTER — Other Ambulatory Visit: Payer: Self-pay | Admitting: Gastroenterology

## 2012-07-26 DIAGNOSIS — R188 Other ascites: Secondary | ICD-10-CM

## 2012-07-26 NOTE — Telephone Encounter (Signed)
Patient is scheduled for April 30th at 1:45 and she is aware

## 2012-07-26 NOTE — Telephone Encounter (Signed)
Pt called and said she thinks she might be getting close to needing another fluid draw. She said she is not as bloated as the last time she had fluid drawn, but she weighs 194 this AM. She has a little shortness of breath, not bad yet. No extremity edema. If she didn't do paracentesis today or tomorrow, she would have to wait a few days. Please advise!

## 2012-07-26 NOTE — Telephone Encounter (Signed)
Schedule large volume paracentesis in the next 1-2 days.

## 2012-07-26 NOTE — Telephone Encounter (Signed)
Pt is aware and would like it scheduled for tomorrow if possible.

## 2012-07-27 ENCOUNTER — Ambulatory Visit (HOSPITAL_COMMUNITY)
Admission: RE | Admit: 2012-07-27 | Discharge: 2012-07-27 | Disposition: A | Discharge status: Home / Self Care | Payer: BC Managed Care – PPO | Source: Ambulatory Visit | Attending: Gastroenterology | Admitting: Gastroenterology

## 2012-07-27 DIAGNOSIS — R188 Other ascites: Secondary | ICD-10-CM

## 2012-07-27 DIAGNOSIS — K7460 Unspecified cirrhosis of liver: Secondary | ICD-10-CM | POA: Insufficient documentation

## 2012-07-27 MED ORDER — ALBUMIN HUMAN 25 % IV SOLN
INTRAVENOUS | Status: AC
  Administered 2012-07-27: 25 g via INTRAVENOUS
  Filled 2012-07-27: qty 100

## 2012-07-27 MED ORDER — LIDOCAINE HCL (PF) 2 % IJ SOLN
INTRAMUSCULAR | Status: AC
  Administered 2012-07-27: 10 mg via INTRADERMAL
  Filled 2012-07-27: qty 10

## 2012-07-27 MED ORDER — ALBUMIN HUMAN 25 % IV SOLN: 25.0000 g | Freq: Once | INTRAVENOUS | Status: AC

## 2012-07-27 NOTE — Progress Notes (Signed)
Lidocaine 1%           9mL injected                        abdominal fluid removed         IV started in right arm with 22G angiocath            Albumin 25g administered via IV

## 2012-08-08 ENCOUNTER — Other Ambulatory Visit: Payer: Self-pay | Admitting: Gastroenterology

## 2012-08-08 ENCOUNTER — Telehealth: Payer: Self-pay

## 2012-08-08 ENCOUNTER — Ambulatory Visit (HOSPITAL_COMMUNITY)
Admission: RE | Admit: 2012-08-08 | Discharge: 2012-08-08 | Disposition: A | Discharge status: Home / Self Care | Payer: BC Managed Care – PPO | Source: Ambulatory Visit | Attending: Gastroenterology | Admitting: Gastroenterology

## 2012-08-08 DIAGNOSIS — R188 Other ascites: Secondary | ICD-10-CM

## 2012-08-08 DIAGNOSIS — K7460 Unspecified cirrhosis of liver: Secondary | ICD-10-CM | POA: Insufficient documentation

## 2012-08-08 MED ORDER — ALBUMIN HUMAN 25 % IV SOLN: 25.0000 g | Freq: Once | INTRAVENOUS | Status: AC

## 2012-08-08 MED ORDER — ALBUMIN HUMAN 25 % IV SOLN
INTRAVENOUS | Status: AC
  Administered 2012-08-08: 25 g via INTRAVENOUS
  Filled 2012-08-08: qty 100

## 2012-08-08 NOTE — Telephone Encounter (Signed)
Patient has been told to arrive at [facility] Radiology today at 12:30 and

## 2012-08-08 NOTE — Telephone Encounter (Signed)
Pt is aware that Deanna Thompson will schedule Paracentesis.

## 2012-08-08 NOTE — Telephone Encounter (Signed)
PLEASE CALL PT. She needs a large volume paracentesis ASAP. SHE NEEDS ALBUMIN 12.5 GMS IV AT ONSET AND REPEAT AFTER 4 LS. SHE SHOULD HOLD HER DIURETICS on TUES. SHE NEEDS TO STRICTLY FOLLOW A LOW SALT DIET.

## 2012-08-08 NOTE — Procedures (Signed)
PreOperative Dx: Cirrhosis, ascites Postoperative Dx: Cirrhosis, ascites Procedure:   US paracentesis Radiologist:  Tyron Russell Anesthesia:  8 ml of 1% lidocaine Specimen:  9000 ml fluid EBL:   None Complications: None

## 2012-08-08 NOTE — Telephone Encounter (Signed)
Pt called and said her weight this AM was 194 lbs. She is swollen in her abdomen and up a little higher also. She does not have any swelling/edema in extremities. No shortness of breath at this time. However, If she needs Paracentesis this week, she said she could only do it today or tomorrow due to other obligations this week. Please advise!

## 2012-08-16 ENCOUNTER — Ambulatory Visit (INDEPENDENT_AMBULATORY_CARE_PROVIDER_SITE_OTHER): Payer: BC Managed Care – PPO | Admitting: General Surgery

## 2012-08-16 ENCOUNTER — Encounter (INDEPENDENT_AMBULATORY_CARE_PROVIDER_SITE_OTHER): Payer: Self-pay | Admitting: General Surgery

## 2012-08-16 ENCOUNTER — Telehealth (INDEPENDENT_AMBULATORY_CARE_PROVIDER_SITE_OTHER): Payer: Self-pay | Admitting: General Surgery

## 2012-08-16 VITALS — BP 126/80 | HR 80 | Temp 99.0°F | Resp 18 | Ht 68.0 in | Wt 190.0 lb

## 2012-08-16 DIAGNOSIS — K4090 Unilateral inguinal hernia, without obstruction or gangrene, not specified as recurrent: Secondary | ICD-10-CM

## 2012-08-16 DIAGNOSIS — K429 Umbilical hernia without obstruction or gangrene: Secondary | ICD-10-CM

## 2012-08-16 HISTORY — DX: Unilateral inguinal hernia, without obstruction or gangrene, not specified as recurrent: K40.90

## 2012-08-16 NOTE — Telephone Encounter (Signed)
LMOM at 9:30 5/20 asking patient to call me back...wanting to see if she could come in earlier for her appt today be here at 4:00

## 2012-08-16 NOTE — Progress Notes (Signed)
Subjective:     Patient ID: Deanna Thompson, female   DOB: [birth year], 56 y.o.   MRN: [id number]  HPI The patient is a 57 year old female with history of cirrhosis and ascites which requires drainage approximately every 2-3 weeks. The patient presents to clinic secondary to her abdominal wall hernia and right inguinal hernia. She states she does get some pain in the right inguinal area with increased ascites. She states that after paracentesis her umbilical hernia and right inguinal hernia symptomatic. She currently wears a truss to help with her pain. She does notice after standing long periods of time she has increased right inguinal pain.  Review of Systems  Constitutional: Negative.   HENT: Negative.   Respiratory: Negative.   Cardiovascular: Negative.   Gastrointestinal: Negative.   Neurological: Negative.   All other systems reviewed and are negative.       Objective:   Physical Exam  Constitutional: She is oriented to person, place, and time. She appears well-developed and well-nourished.  HENT:  Head: Normocephalic and atraumatic.  Eyes: Conjunctivae and EOM are normal. Pupils are equal, round, and reactive to light.  Neck: Normal range of motion. Neck supple.  Cardiovascular: Normal rate and normal heart sounds.   Pulmonary/Chest: Effort normal and breath sounds normal.  Abdominal: Soft. Bowel sounds are normal. A hernia is present. Hernia confirmed positive in the ventral area and confirmed positive in the right inguinal area.  Musculoskeletal: Normal range of motion.  Neurological: She is alert and oriented to person, place, and time.       Assessment:     57 year old female with alcoholic cirrhosis umbilical hernia and right inguinal hernia    Plan:     1. Recommend continue with the right inguinal truss. Patient can use an abdominal binder to help [place] compression of her umbilical hernia. 2. Should the patient come to the point that she did not need paracentesis  which did discuss in repair of right inguinal hernia. 3. We talked about the possibility of skin erosion of her umbilical hernia. Should this become more evident we can move to fixing this electively prior rupturing of her hernia sac. 4. The patient follow up as needed

## 2012-08-17 ENCOUNTER — Telehealth: Payer: Self-pay

## 2012-08-17 ENCOUNTER — Other Ambulatory Visit: Payer: Self-pay | Admitting: Gastroenterology

## 2012-08-17 DIAGNOSIS — R188 Other ascites: Secondary | ICD-10-CM

## 2012-08-17 NOTE — Telephone Encounter (Signed)
PLEASE CALL PT. She needs a large volume paracentesis TOMORROW. SHE NEEDS ALBUMIN 12.5 GMS IV AT ONSET AND REPEAT AFTER 4 LS. HOLD HER DIURETICS TOMORROW. SHE NEEDS TO STRICTLY FOLLOW A LOW SALT DIET.

## 2012-08-17 NOTE — Telephone Encounter (Signed)
Patient is scheduled for Paracentesis on May 22nd at 12:00

## 2012-08-17 NOTE — Telephone Encounter (Signed)
Pt left VM. Thinks she might need paracentesis. Her weight is 191.5 lbs this AM. No edema , very little shortness of breath. She is not as distended but abdomen very tight. She has taken diuretics this AM. Please advise! ( She can be reached at home or on cell ).

## 2012-08-17 NOTE — Telephone Encounter (Signed)
Called and informed pt. She is aware that Soledad Gerlach will probably call later this afternoon for the appt.

## 2012-08-18 ENCOUNTER — Ambulatory Visit (HOSPITAL_COMMUNITY)
Admission: RE | Admit: 2012-08-18 | Discharge: 2012-08-18 | Disposition: A | Discharge status: Home / Self Care | Payer: BC Managed Care – PPO | Source: Ambulatory Visit | Attending: Gastroenterology | Admitting: Gastroenterology

## 2012-08-18 DIAGNOSIS — R188 Other ascites: Secondary | ICD-10-CM

## 2012-08-18 MED ORDER — ALBUMIN HUMAN 25 % IV SOLN
25.0000 g | Freq: Once | INTRAVENOUS | Status: AC
  Filled 2012-08-18: qty 100

## 2012-08-18 MED ORDER — ALBUMIN HUMAN 25 % IV SOLN
INTRAVENOUS | Status: AC
  Administered 2012-08-18: 25 g via INTRAVENOUS
  Filled 2012-08-18: qty 100

## 2012-08-18 NOTE — Progress Notes (Signed)
Lidocaine 1%               2.15mL injected                        abdominal fluid removed           IV started in right arm with 22G angiocath                Albumin 25g administered via IV

## 2012-08-23 ENCOUNTER — Ambulatory Visit (INDEPENDENT_AMBULATORY_CARE_PROVIDER_SITE_OTHER): Payer: BC Managed Care – PPO | Admitting: Obstetrics & Gynecology

## 2012-08-23 ENCOUNTER — Telehealth: Payer: Self-pay

## 2012-08-23 ENCOUNTER — Encounter: Payer: Self-pay | Admitting: Obstetrics & Gynecology

## 2012-08-23 VITALS — BP 110/50 | Wt 181.0 lb

## 2012-08-23 DIAGNOSIS — N9489 Other specified conditions associated with female genital organs and menstrual cycle: Secondary | ICD-10-CM

## 2012-08-23 NOTE — Progress Notes (Signed)
Patient ID: Deanna Thompson, female   DOB: 1956/01/01, 57 y.o.   MRN: [id number] Patient with some swelling of her vulva periodic variable symptoms Has chronic liver failure with hypoalbunemia which is my suspicion for the cause  Exam No infection No evidence cyst or mass Appears to be related to her low albumin and swelling, took 7+ liters off last week  No treatment needed  Face to face 15 minutes

## 2012-08-23 NOTE — Telephone Encounter (Signed)
Per Tana Coast, PA, should be ok to be on antibiotics and she should eat some yogurt to try to avoid C-Diff. I called and informed her husband Primitivo Gauze. He will call her, she is on way to dentist.

## 2012-08-23 NOTE — Telephone Encounter (Signed)
Pt called and said she thinks she has an abcessed tooth and will be seeing a dentist between 10:00 Am and 11:00 AM this Am. She asks, " if he puts me on a broad spectrum antibiotic is it OK?" I told her Dr. Darrick Penna is off today but I will check and call her back.  Verlon Au, would you please advise!

## 2012-08-25 ENCOUNTER — Ambulatory Visit: Payer: Self-pay | Admitting: Obstetrics & Gynecology

## 2012-08-29 ENCOUNTER — Telehealth: Payer: Self-pay

## 2012-08-29 NOTE — Telephone Encounter (Signed)
Pt called to let Dr. Darrick Penna know she will probably need paracentesis this week. She said her weight is at 189 today. ( last time it was 191.5 and they removed 7L).    She said she does not have edema, no shortness of breath. She thought she might could bet scheduled for Wed, Thurs or Fri.  She is aware Dr. Darrick Penna is off today, and since it is not rush, she will await for a call from Dr. Darrick Penna our office Tomorrow.

## 2012-08-30 ENCOUNTER — Other Ambulatory Visit: Payer: Self-pay | Admitting: Gastroenterology

## 2012-08-30 ENCOUNTER — Ambulatory Visit (HOSPITAL_COMMUNITY)
Admission: RE | Admit: 2012-08-30 | Discharge: 2012-08-30 | Disposition: A | Discharge status: Home / Self Care | Payer: BC Managed Care – PPO | Source: Ambulatory Visit | Attending: Gastroenterology | Admitting: Gastroenterology

## 2012-08-30 DIAGNOSIS — R188 Other ascites: Secondary | ICD-10-CM

## 2012-08-30 DIAGNOSIS — K7460 Unspecified cirrhosis of liver: Secondary | ICD-10-CM | POA: Insufficient documentation

## 2012-08-30 MED ORDER — LIDOCAINE HCL (PF) 1 % IJ SOLN
INTRAMUSCULAR | Status: AC
  Filled 2012-08-30: qty 5

## 2012-08-30 MED ORDER — ALBUMIN HUMAN 25 % IV SOLN: 25.0000 g | Freq: Once | INTRAVENOUS | Status: AC

## 2012-08-30 MED ORDER — ALBUMIN HUMAN 25 % IV SOLN
INTRAVENOUS | Status: AC
  Administered 2012-08-30: 25 g via INTRAVENOUS
  Filled 2012-08-30: qty 100

## 2012-08-30 NOTE — Procedures (Signed)
Clinical Data: Ascites.  Cirrhosis. ULTRASOUND GUIDED PARACENTESIS Comparison:  08/18/2012 and CT of 08/20/2011. An ultrasound guided paracentesis was thoroughly discussed with the patient and questions answered.  The benefits, risks, alternatives and complications were also discussed.  The patient understands and wishes to proceed with the procedure.  Written consent was obtained.  Ultrasound was performed to localize and mark an adequate pocket of fluid in the [left lower quadrant] quadrant of the abdomen.  The area was then prepped and draped in the normal sterile fashion.  1% Lidocaine was used for local anesthesia.  Under ultrasound guidance a 19 gauge Yueh catheter was introduced.  Paracentesis was performed.  The catheter was removed and a dressing applied. Complications:  None Findings:  A total of approximately 9 of serouw fluid was removed.  A fluid sample was not sent for laboratory analysis. The patient was given a total of 25 grams of albumin during  the procedure.   IMPRESSION: Successful ultrasound guided paracentesis yielding 9 of ascites.

## 2012-08-30 NOTE — Telephone Encounter (Signed)
PLEASE CALL PT. She needs a large volume paracentesis ASAP. SHE NEEDS ALBUMIN 12.5 GMS IV AT ONSET AND REPEAT AFTER 4 LS. SHE SHOULD HOLD HER DIURETICS ON THE DAY OF HER PARACENTESIS. SHE NEEDS TO STRICTLY FOLLOW A LOW SALT DIET.

## 2012-08-30 NOTE — Telephone Encounter (Signed)
Called and informed pt. She said she has not had her diurectics today if she gets an appt today.

## 2012-08-30 NOTE — Telephone Encounter (Signed)
Patient is scheduled for 12:30 today and she is aware

## 2012-08-30 NOTE — Progress Notes (Signed)
Lidocaine 1%             6mL injected                        abdominal fluid removed         IV started in right arm with 22G angiocath                Albumin 25g administered via IV

## 2012-09-06 ENCOUNTER — Other Ambulatory Visit: Payer: Self-pay | Admitting: Gastroenterology

## 2012-09-06 ENCOUNTER — Telehealth: Payer: Self-pay

## 2012-09-06 DIAGNOSIS — R188 Other ascites: Secondary | ICD-10-CM

## 2012-09-06 MED ORDER — ALBUMIN HUMAN 25 % IV SOLN: 25.0000 g | Freq: Once | INTRAVENOUS | Status: AC

## 2012-09-06 NOTE — Telephone Encounter (Signed)
PLEASE CALL PT. She needs a large volume paracentesis ASAP. SHE NEEDS ALBUMIN 12.5 GMS IV AT ONSET AND REPEAT AFTER 4 LS. SHE SHOULD HOLD HER DIURETICS ON DAY OF PARTACENTESIS. SHE NEEDS TO STRICTLY FOLLOW A LOW SALT DIET.

## 2012-09-06 NOTE — Telephone Encounter (Signed)
Pt left VM. She said her belly is tight again. Very distended. No edema , no shortness of breath. Her weight was 186 lbs yesterday morning. She has not weighed today. Please advise!

## 2012-09-06 NOTE — Telephone Encounter (Signed)
12:30 ON 09/07/12 AND PATIENT IS AWARE

## 2012-09-06 NOTE — Telephone Encounter (Signed)
Called pt. She took diuretics today.Please schedule for tomorrow.

## 2012-09-07 ENCOUNTER — Ambulatory Visit (HOSPITAL_COMMUNITY)
Admission: RE | Admit: 2012-09-07 | Discharge: 2012-09-07 | Disposition: A | Discharge status: Home / Self Care | Payer: BC Managed Care – PPO | Source: Ambulatory Visit | Attending: Gastroenterology | Admitting: Gastroenterology

## 2012-09-07 DIAGNOSIS — R188 Other ascites: Secondary | ICD-10-CM | POA: Insufficient documentation

## 2012-09-07 DIAGNOSIS — K7460 Unspecified cirrhosis of liver: Secondary | ICD-10-CM | POA: Insufficient documentation

## 2012-09-07 MED ORDER — ALBUMIN HUMAN 25 % IV SOLN
INTRAVENOUS | Status: AC
  Administered 2012-09-07: 25 g via INTRAVENOUS
  Filled 2012-09-07: qty 100

## 2012-09-07 NOTE — Procedures (Signed)
PreOperative Dx: Cirrhosis, recurrent ascites Postoperative Dx: Cirrhosis, recurrent ascites Procedure:   US guided paracentesis Radiologist:  Tyron Russell Anesthesia:  7 ml of 1% lidocaine Specimen:  6300 ml of clear yellow fluid EBL:   < 1 ml Complications: None

## 2012-09-07 NOTE — Progress Notes (Signed)
Lidocaine 1%                 8mL injected                        abdominal fluid removed            IV started in right hand with 22G angiocath                Albumin 25g administered via IV

## 2012-09-13 ENCOUNTER — Telehealth: Payer: Self-pay

## 2012-09-13 NOTE — Telephone Encounter (Signed)
Pt came by the office just now. Said she thought she is getting almost ready for her next paracentesis. She weighed 187 lbs this AM. She is not really short of breath yet. She just does not want to get miserable. I told her Dr. Darrick Penna is on vacation and I will send the message to Tana Coast, PA and check with her early in the morning. She will hold her diuretics in the morning until she hears from me so if they want her to have it tomorrow. Please advise!

## 2012-09-14 ENCOUNTER — Other Ambulatory Visit: Payer: Self-pay | Admitting: Gastroenterology

## 2012-09-14 DIAGNOSIS — R188 Other ascites: Secondary | ICD-10-CM

## 2012-09-14 NOTE — Telephone Encounter (Signed)
Called and informed pt.  

## 2012-09-14 NOTE — Telephone Encounter (Signed)
OK to schedule large volume abd paracentesis for tomorrow.  Do not take diuretics day of tap.  Give albumin 12.5 grams IV at start of tap and then additional 12.5 grams IV after 4 liters removed.  Send fluid for cell count with diff and culture.

## 2012-09-14 NOTE — Telephone Encounter (Signed)
Patient is scheduled on Thursday 6/19 at 11:00

## 2012-09-15 ENCOUNTER — Ambulatory Visit (HOSPITAL_COMMUNITY)
Admission: RE | Admit: 2012-09-15 | Discharge: 2012-09-15 | Disposition: A | Discharge status: Home / Self Care | Payer: BC Managed Care – PPO | Source: Ambulatory Visit | Attending: Gastroenterology | Admitting: Gastroenterology

## 2012-09-15 DIAGNOSIS — K7460 Unspecified cirrhosis of liver: Secondary | ICD-10-CM | POA: Insufficient documentation

## 2012-09-15 DIAGNOSIS — R188 Other ascites: Secondary | ICD-10-CM

## 2012-09-15 LAB — BODY FLUID CELL COUNT WITH DIFFERENTIAL: Eos, Fluid: 0 %

## 2012-09-15 MED ORDER — ALBUMIN HUMAN 25 % IV SOLN
INTRAVENOUS | Status: AC
  Administered 2012-09-15: 25 g via INTRAVENOUS
  Filled 2012-09-15: qty 100

## 2012-09-15 MED ORDER — ALBUMIN HUMAN 25 % IV SOLN
25.0000 g | Freq: Once | INTRAVENOUS | Status: AC
  Filled 2012-09-15: qty 100

## 2012-09-15 NOTE — Progress Notes (Signed)
Lidocaine 1%            7mL injected                        abdominal fluid removed         IV started in right hand with 22G angiocath                Albumin 25g administered via IV

## 2012-09-15 NOTE — Procedures (Signed)
PreOperative Dx: Cirrhosis, ascites Postoperative Dx: Cirrhosis, ascites Procedure:   US guided paracentesis Radiologist:  Tyron Russell Anesthesia:  7 ml of 1% lidocaine Specimen:  10.35 L of serous ascitic fluid EBL:   < 1 ml Complications: None

## 2012-09-17 NOTE — Progress Notes (Signed)
REVIEWED.  

## 2012-09-19 ENCOUNTER — Telehealth: Payer: Self-pay

## 2012-09-19 NOTE — Telephone Encounter (Signed)
PLEASE CALL PT. SHE MAY TAKE BENADRYL 12.5 MG PO Q6H PRN FOR ITCHING. SHE SHOULD SEE A DERMATOLOGIST.

## 2012-09-19 NOTE — Telephone Encounter (Signed)
Called and informed pt.  

## 2012-09-19 NOTE — Telephone Encounter (Signed)
Pt called and said on Sat night and into Sun, she had a rash over 90% of her body, and it itched a lot. She used every cream/ointment in the house and not much relief. She has not used any new detergents, etc., no new meds. She did not take Benadryl because she said she was not supposed to. She said she is much better today but she just wants to know if Dr. Darrick Penna feels this is coming from her liver or should she see a dermatologist. She just wants to know what she should do if this occurs again. Please advise!

## 2012-09-20 LAB — CULTURE, BODY FLUID W GRAM STAIN -BOTTLE: Culture: NO GROWTH

## 2012-09-26 ENCOUNTER — Telehealth: Payer: Self-pay

## 2012-09-26 NOTE — Telephone Encounter (Signed)
Called and told pt. She said she will try to do that.

## 2012-09-26 NOTE — Telephone Encounter (Signed)
PT WAS 201 LBS IN APR 2014. IF SHE IS NOT DISTENDED SHE SHOULD TAKE HER DIURETICS AND FOLLOW A LOW SALT DIET. SHE SHOULD TRY TO GO 2 WEEKS BETEWEEN TAPS.

## 2012-09-26 NOTE — Telephone Encounter (Signed)
Pt called and said her weight is up to 185 now. She has no edema but a little shortness of breath and feeling a little uncomfortable. She has not taken her diuretics today. Please advise!

## 2012-09-28 ENCOUNTER — Telehealth: Payer: Self-pay

## 2012-09-28 ENCOUNTER — Ambulatory Visit (HOSPITAL_COMMUNITY)
Admission: RE | Admit: 2012-09-28 | Discharge: 2012-09-28 | Disposition: A | Discharge status: Home / Self Care | Payer: BC Managed Care – PPO | Source: Ambulatory Visit | Attending: Gastroenterology | Admitting: Gastroenterology

## 2012-09-28 ENCOUNTER — Other Ambulatory Visit: Payer: Self-pay | Admitting: Gastroenterology

## 2012-09-28 DIAGNOSIS — R188 Other ascites: Secondary | ICD-10-CM

## 2012-09-28 DIAGNOSIS — K7460 Unspecified cirrhosis of liver: Secondary | ICD-10-CM | POA: Insufficient documentation

## 2012-09-28 MED ORDER — ALBUMIN HUMAN 25 % IV SOLN: 25.0000 g | Freq: Once | INTRAVENOUS | Status: AC

## 2012-09-28 MED ORDER — ALBUMIN HUMAN 25 % IV SOLN
INTRAVENOUS | Status: AC
  Administered 2012-09-28: 25 g via INTRAVENOUS
  Filled 2012-09-28: qty 100

## 2012-09-28 NOTE — Telephone Encounter (Signed)
Pt called. Her weight is still at 185 lbs. She said she is distended and larger on both sides but in the mid abdomen she isn't. She has no edema. She has some shortness of breath. She is uncomfortable but not as bad as sometimes. Please advise about having paracentesis.

## 2012-09-28 NOTE — Telephone Encounter (Signed)
PLEASE CALL PT. She needs a large volume paracentesis. SHE NEEDS ALBUMIN 12.5 GMS IV AT ONSET AND REPEAT AFTER 4 LS. SHE SHOULD HOLD HER DIURETICS TODAY. SHE NEEDS TO STRICTLY FOLLOW A LOW SALT DIET.

## 2012-09-28 NOTE — Telephone Encounter (Signed)
Patient is scheduled for 2:00 today and she is aware

## 2012-09-28 NOTE — Progress Notes (Signed)
Lidocaine 1%            7mL injected                        abdominal fluid removed         IV started in right arm with 22G angiocath                Albumin 25g administered via IV

## 2012-09-28 NOTE — Procedures (Signed)
PreOperative Dx: Cirrhosis, ascites Postoperative Dx: Cirrhosis, ascites Procedure:   US guided paracentesis Radiologist:  Tyron Russell Anesthesia:  7 ml of 1% lidocaine Specimen:  6000 ml of yellow ascitic fluid EBL:   < 1 ml Complications: None

## 2012-09-28 NOTE — Telephone Encounter (Signed)
Pt is aware.  

## 2012-10-05 ENCOUNTER — Other Ambulatory Visit: Payer: Self-pay | Admitting: Gastroenterology

## 2012-10-05 ENCOUNTER — Ambulatory Visit (INDEPENDENT_AMBULATORY_CARE_PROVIDER_SITE_OTHER): Payer: BC Managed Care – PPO | Admitting: Gastroenterology

## 2012-10-05 ENCOUNTER — Encounter: Payer: Self-pay | Admitting: Gastroenterology

## 2012-10-05 VITALS — BP 116/71 | HR 80 | Temp 98.4°F | Ht 70.0 in | Wt 187.2 lb

## 2012-10-05 DIAGNOSIS — R188 Other ascites: Secondary | ICD-10-CM

## 2012-10-05 DIAGNOSIS — K7460 Unspecified cirrhosis of liver: Secondary | ICD-10-CM

## 2012-10-05 MED ORDER — SPIRONOLACTONE 100 MG PO TABS: ORAL_TABLET | ORAL | Status: DC

## 2012-10-05 MED ORDER — BUMETANIDE 1 MG PO TABS: ORAL_TABLET | ORAL | Status: DC

## 2012-10-05 NOTE — Assessment & Plan Note (Addendum)
DIFFICULT CONTROLLING ASCITES.  FOLLOW A LOW SALT DIET  LV PARACENTESIS FRI ALBUMIN 12.5 GMS IV AT ONSET AND REPEAT AT 4LS FOR TOTAL OF 25 GMS  HOLD DIURETIC FRI. INCREASE BUMEX/ALDACTONE TO TID AFTER RETURN TO [place]. OPV JUL 24.

## 2012-10-05 NOTE — Progress Notes (Signed)
Cc PCP 

## 2012-10-05 NOTE — Progress Notes (Signed)
Subjective:    Patient ID: Deanna Thompson, female    DOB: 26-Oct-1955, 57 y.o.   MRN: [id number]  [name]., MD  HPI DOING OK ON A LOW NA DIET. LAST TIME CALLED ABOUT PARACENTESIS. LAST TIME: 6Ls OFF. HAS LOST WEIGHT AND FEELS MORE UNCOMFORTABLE. SINCE LAST PARACENTESIS: GAINED FROM 173 LBS AND NOW 182 LBS. TIGHT AND FEELS LIKE FLUID IS IN THERE. FEELS UNSURE WHEN SHE NEEDS TO CALL. GOING TO [place] NEXT TUES. STAYING NEXT TUE.    Past Medical History  Diagnosis Date  . Brain aneurysm   . IBS (irritable bowel syndrome)   . GERD (gastroesophageal reflux disease)   . Panic attack   . Anxiety   . Psoriatic arthritis   . ETOH abuse     Quit in 07/2011   . Sjogren's disease   . Cirrhosis     Presumably alcoholic cirrhosis  . Complication of anesthesia   . PONV (postoperative nausea and vomiting)   . HTN (hypertension)     no longer medicated, dated on 04/25/12    Past Surgical History  Procedure Laterality Date  . Cerebral aneurysm repair    . Esophageal biopsy  10/13/2011    SLF: mild gastritis/duodenal polypoid lesion   . Colonoscopy  10/13/2011    [id number] hemorrhoids/varices rectal/lesion in ascending colon(HYPERPLASTIC POLYP)  . Paracentesis  Feb 2014    10.7 liters    Allergies  Allergen Reactions  . Omeprazole Shortness Of Breath  . Phenytoin Rash  . Cephalexin Other (See Comments)    Severe yeast infection  . Phenergan (Promethazine Hcl)     Current Outpatient Prescriptions  Medication Sig Dispense Refill  . bumetanide (BUMEX) 1 MG tablet 1 PO bid    . escitalopram (LEXAPRO) 10 MG tablet Take 20 mg by mouth daily.     Marland Kitchen esomeprazole (NEXIUM) 40 MG capsule Take 1 capsule (40 mg total) by mouth daily before breakfast.    . folic acid (FOLVITE) 1 MG tablet TAKE (1) TABLET BY MOUTH EACH MORNING.    . NASONEX 50 MCG/ACT nasal spray Place 2 sprays into the nose as needed. For allergies    . rifaximin (XIFAXAN) 550 MG TABS Take 1 tablet (550 mg total) by  mouth 2 (two) times daily.    . Simethicone (GAS-X EXTRA STRENGTH) 125 MG CAPS Take 1 capsule by mouth daily.      Marland Kitchen spironolactone (ALDACTONE) 100 MG tablet Take 100 mg by mouth 2 (two) times daily. 1 PO tid      . Tetrahydrozoline HCl (VISINE OP) Apply 2 drops to eye 2 (two) times daily.          Review of Systems     Objective:   Physical Exam  Vitals reviewed. Constitutional: She is oriented to person, place, and time. She appears well-nourished. No distress.  HENT:  Head: Normocephalic and atraumatic.  Mouth/Throat: Oropharynx is clear and moist. No oropharyngeal exudate.  Eyes: Pupils are equal, round, and reactive to light. No scleral icterus.  Neck: Normal range of motion. Neck supple.  Cardiovascular: Normal rate, regular rhythm and normal heart sounds.   Pulmonary/Chest: Effort normal and breath sounds normal. No respiratory distress.  Abdominal: Soft. Bowel sounds are normal. She exhibits distension (MODERATE). There is no tenderness. There is no rebound and no guarding.  Musculoskeletal: She exhibits no edema.  Lymphadenopathy:    She has no cervical adenopathy.  Neurological: She is alert and oriented to person, place, and time.  NO FOCAL DEFICITS  Skin:  SPIDER ANGIOMATA ON CHEST  Psychiatric:  SLIGHTLY TEARFUL, NL AFFECTS          Assessment & Plan:

## 2012-10-05 NOTE — Progress Notes (Signed)
Pt has OV for 7/24 with SF

## 2012-10-05 NOTE — Assessment & Plan Note (Addendum)
NOT WELL CONTROLLED-6-8 LS IN ABDOMEN. RESPONDS TO TAPS. TOLERATING PARACENTESIS.   FOLLOW A LOW SALT DIET  LV PARACENTESIS FRI ALBUMIN 12.5 GMS IV AT ONSET AND REPEAT AT 4LS FOR TOTAL OF 25 GMS  HOLD DIURETIC FRI. INCREASE BUMEX/ALDACTONE AFTER RETURN TO [place]. OPV JUL 24.

## 2012-10-05 NOTE — Patient Instructions (Addendum)
FOLLOW A LOW SALT DIET.  LV PARACENTESIS FRI.  ALBUMIN 12.5 GMS IV AT ONSET AND REPEAT AT 4LS FOR TOTAL OF 25 GMS   HOLD DIURETICS FRI.  INCREASE ALDACTONE/BUMEX AFTER YOU GET BACK FROM [place].  FOLLOW UP JUL 24.

## 2012-10-06 ENCOUNTER — Ambulatory Visit (HOSPITAL_COMMUNITY)
Admission: RE | Admit: 2012-10-06 | Discharge: 2012-10-06 | Disposition: A | Discharge status: Home / Self Care | Payer: BC Managed Care – PPO | Source: Ambulatory Visit | Attending: Gastroenterology | Admitting: Gastroenterology

## 2012-10-06 DIAGNOSIS — R188 Other ascites: Secondary | ICD-10-CM | POA: Insufficient documentation

## 2012-10-06 DIAGNOSIS — K7460 Unspecified cirrhosis of liver: Secondary | ICD-10-CM | POA: Insufficient documentation

## 2012-10-06 MED ORDER — ALBUMIN HUMAN 25 % IV SOLN: 25.0000 g | Freq: Once | INTRAVENOUS | Status: AC

## 2012-10-06 MED ORDER — ALBUMIN HUMAN 25 % IV SOLN
INTRAVENOUS | Status: AC
  Administered 2012-10-06: 25 g via INTRAVENOUS
  Filled 2012-10-06: qty 100

## 2012-10-06 NOTE — Procedures (Signed)
PreOperative Dx: Cirrhosis, ascites Postoperative Dx: Cirrhosis, ascites Procedure:   US guided paracentesis Radiologist:  Tyron Russell Anesthesia:  7 ml of 1% lidocaine Specimen:  6600 ml of clear yellow ascitic fluid EBL:   < 1 ml Complications: None

## 2012-10-06 NOTE — Progress Notes (Signed)
Lidocaine 1%            7mL injected                        abdominal fluid removed         IV started in right arm with 22G angiocath                Albumin 25g administered via IV

## 2012-10-17 ENCOUNTER — Telehealth: Payer: Self-pay

## 2012-10-17 NOTE — Telephone Encounter (Signed)
Pt called and wants to know if she needs any labs prior to OV on 10/20/2012?

## 2012-10-18 NOTE — Telephone Encounter (Signed)
Called and informed Deanna Thompson.  

## 2012-10-18 NOTE — Telephone Encounter (Signed)
PLEASE CALL PT. Will see her and then order labs.

## 2012-10-20 ENCOUNTER — Other Ambulatory Visit: Payer: Self-pay

## 2012-10-20 ENCOUNTER — Encounter: Payer: Self-pay | Admitting: Gastroenterology

## 2012-10-20 ENCOUNTER — Other Ambulatory Visit: Payer: Self-pay | Admitting: Gastroenterology

## 2012-10-20 ENCOUNTER — Ambulatory Visit (INDEPENDENT_AMBULATORY_CARE_PROVIDER_SITE_OTHER): Payer: BC Managed Care – PPO | Admitting: Gastroenterology

## 2012-10-20 VITALS — BP 125/76 | HR 69 | Temp 97.4°F | Ht 68.0 in | Wt 183.0 lb

## 2012-10-20 DIAGNOSIS — K7460 Unspecified cirrhosis of liver: Secondary | ICD-10-CM

## 2012-10-20 DIAGNOSIS — R188 Other ascites: Secondary | ICD-10-CM

## 2012-10-20 DIAGNOSIS — K7010 Alcoholic hepatitis without ascites: Secondary | ICD-10-CM

## 2012-10-20 LAB — CBC WITH DIFFERENTIAL/PLATELET
Basophils Relative: 1 % (ref 0–1)
Eosinophils Absolute: 0.2 10*3/uL (ref 0.0–0.7)
MCH: 30.6 pg (ref 26.0–34.0)
MCHC: 34.1 g/dL (ref 30.0–36.0)
Neutrophils Relative %: 58 % (ref 43–77)
Platelets: 260 10*3/uL (ref 150–400)
RDW: 14.4 % (ref 11.5–15.5)

## 2012-10-20 LAB — COMPREHENSIVE METABOLIC PANEL
ALT: 23 U/L (ref 0–35)
Alkaline Phosphatase: 98 U/L (ref 39–117)
Sodium: 136 mEq/L (ref 135–145)
Total Bilirubin: 1.2 mg/dL (ref 0.3–1.2)
Total Protein: 6.7 g/dL (ref 6.0–8.3)

## 2012-10-20 LAB — PROTIME-INR: INR: 1.24 (ref <1.50)

## 2012-10-20 NOTE — Progress Notes (Signed)
Subjective:    Patient ID: Deanna Thompson, female    DOB: 1955/08/18, 57 y.o.   MRN: [id number]  [name]., MD  HPI Night sweats: anything?, kicking her butt: arthritis. Can't take biologics. HAS BEEN GOING TO [place]. HAS RESERVATIONS ABOUT BIOLOGICS AND HER LIVER DISEASE. ASCITES DOING BETTER. TRYING TO DO BETTER AFTER HER SALT. LAST TAP 2 WEEKS AGO. GAINED 5 LBS WHEN SHE WENT ON HER TRIP. CAME BACK AND NOW HAS LOST 6 LBS. HAS FATIGUE AND DEPRESSION DUE TO LIMITING FACTOR OF ILLNESSES. CONCERNED ALBUMIN MAYBE CAUSING A RASH. HAS AN UMBILICAL HERNIA. WORE HER HERNIA BELT AND GOT CHECKED BY [facility]. TAKING FISH OIL BUT QUIT BECAUSE IT DIDN'T HELP ARTHRITIS.   Past Medical History  Diagnosis Date  . Brain aneurysm   . IBS (irritable bowel syndrome)   . GERD (gastroesophageal reflux disease)   . Panic attack   . Anxiety   . Psoriatic arthritis   . ETOH abuse     Quit in 07/2011   . Sjogren's disease   . Cirrhosis     Presumably alcoholic cirrhosis  . Complication of anesthesia   . PONV (postoperative nausea and vomiting)   . HTN (hypertension)     no longer medicated, dated on 04/25/12   Past Surgical History  Procedure Laterality Date  . Cerebral aneurysm repair    . Esophageal biopsy  10/13/2011    SLF: mild gastritis/duodenal polypoid lesion   . Colonoscopy  10/13/2011    [id number] hemorrhoids/varices rectal/lesion in ascending colon(HYPERPLASTIC POLYP)  . Paracentesis  Feb 2014    10.7 liters   Allergies  Allergen Reactions  . Omeprazole Shortness Of Breath  . Phenytoin Rash  . Cephalexin Other (See Comments)    Severe yeast infection  . Phenergan (Promethazine Hcl)    Current Outpatient Prescriptions  Medication Sig Dispense Refill  . bumetanide (BUMEX) 1 MG tablet 1 PO TID    . escitalopram (LEXAPRO) 10 MG tablet Take 20 mg by mouth daily.     Marland Kitchen esomeprazole (NEXIUM) 40 MG capsule Take 1 capsule (40 mg total) by mouth daily before breakfast.    . folic acid  (FOLVITE) 1 MG tablet TAKE (1) TABLET BY MOUTH EACH MORNING.    . NASONEX 50 MCG/ACT nasal spray Place 2 sprays into the nose as needed. For allergies    . rifaximin (XIFAXAN) 550 MG TABS Take 1 tablet (550 mg total) by mouth 2 (two) times daily.    . Simethicone (GAS-X EXTRA STRENGTH) 125 MG CAPS Take 1 capsule by mouth daily.    Marland Kitchen spironolactone (ALDACTONE) 100 MG tablet 1 PO tid    . Tetrahydrozoline HCl (VISINE OP) Apply 2 drops to eye 2 (two) times daily.           Review of Systems     Objective:   Physical Exam  Vitals reviewed. Constitutional: She is oriented to person, place, and time. No distress.  HENT:  Head: Normocephalic and atraumatic.  Mouth/Throat: No oropharyngeal exudate.  Eyes: Pupils are equal, round, and reactive to light. No scleral icterus.  Neck: Normal range of motion. Neck supple.  Cardiovascular: Normal rate, regular rhythm and normal heart sounds.   Pulmonary/Chest: Effort normal and breath sounds normal. No respiratory distress.  Abdominal: Soft. Bowel sounds are normal. Distention: MODERATE. There is no tenderness. There is no rebound and no guarding.  Musculoskeletal: She exhibits no edema.  Lymphadenopathy:    She has no cervical adenopathy.  Neurological: She is alert  and oriented to person, place, and time.  NO FOCAL DEFICITS   Skin:  MULTIPLE SPIDER ANGIOMATA ON CHEST  Psychiatric: She has a normal mood and affect.          Assessment & Plan:

## 2012-10-20 NOTE — Progress Notes (Signed)
Cc PCP 

## 2012-10-20 NOTE — Patient Instructions (Signed)
GET LABS DRAWN.  PARACENTESIS NEXT THUR OR FRI.  CONTINUE DIURETICS.  SEE NUTRITION, DR. ANDERSON, AND DR. Timothy Lasso.  FOLLOW UP IN 4 MOS.

## 2012-10-20 NOTE — Assessment & Plan Note (Signed)
LABS TODAY OPV IN 4 MOS CONTINUE DIURETICS OPV W/ DR. Timothy Lasso SEE DR. ANDERSON FOR ARTHRITIS MANAGEMENT NUTRITION FOR LOW NA/80-100G PROTIEN DIET.

## 2012-10-21 ENCOUNTER — Other Ambulatory Visit: Payer: Self-pay | Admitting: Gastroenterology

## 2012-10-21 NOTE — Progress Notes (Signed)
DONE

## 2012-10-21 NOTE — Progress Notes (Signed)
Called patient TO DISCUSS RESULTS. SPOKE WITH HER HUSBAND. LABS SIGNIFICANTLY IMPROVED. FAX RESULTS TO DR. Timothy Lasso. PT WOULD LIKE A COPY. PLACE A COPY AT FRONT DESK.

## 2012-10-21 NOTE — Progress Notes (Signed)
Copy of labs ar front for pt to pick up.

## 2012-10-24 ENCOUNTER — Telehealth (HOSPITAL_COMMUNITY): Payer: Self-pay | Admitting: Dietician

## 2012-10-24 NOTE — Telephone Encounter (Signed)
Called and left message on pt voicemail at [phone].

## 2012-10-24 NOTE — Telephone Encounter (Signed)
Received referral via fax from Dr. Darrick Penna sent on 10/20/12 for dx: cirrhosis.

## 2012-10-25 NOTE — Telephone Encounter (Signed)
Called back at 1339. Pt unavailable. Will reattempt.

## 2012-10-25 NOTE — Telephone Encounter (Signed)
Received voicemail from pt at 1351. Called back at 1453. Appointment scheduled for 10/31/12 at 1100.

## 2012-10-25 NOTE — Telephone Encounter (Signed)
Received voicemail left by pt on 10/24/12 at 1706.

## 2012-10-26 ENCOUNTER — Encounter (HOSPITAL_COMMUNITY): Payer: Self-pay

## 2012-10-26 ENCOUNTER — Ambulatory Visit (HOSPITAL_COMMUNITY)
Admission: RE | Admit: 2012-10-26 | Discharge: 2012-10-26 | Disposition: A | Discharge status: Home / Self Care | Payer: BC Managed Care – PPO | Source: Ambulatory Visit | Attending: Gastroenterology | Admitting: Gastroenterology

## 2012-10-26 VITALS — BP 118/59 | HR 72 | Resp 16

## 2012-10-26 DIAGNOSIS — R188 Other ascites: Secondary | ICD-10-CM | POA: Insufficient documentation

## 2012-10-26 NOTE — Progress Notes (Signed)
Pt for paracentesis with Dr. Kearney Hard. Pt given 5ml lidocaine 1% by MD.

## 2012-10-26 NOTE — Progress Notes (Signed)
Paracentesis complete 7000 ml yellow abdominal fluid removed. Pt shows no signs of distress

## 2012-10-28 NOTE — Progress Notes (Signed)
Reminder in epic °

## 2012-10-31 ENCOUNTER — Encounter (HOSPITAL_COMMUNITY): Payer: Self-pay | Admitting: Dietician

## 2012-10-31 NOTE — Progress Notes (Signed)
Outpatient Initial Nutrition Assessment  Date:10/31/2012   Appt Start Time: 1103  Referring Physician: Dr. Darrick Penna [facility]) Reason for Visit: cirrhosis  Nutrition Assessment:  Height: 5\' 8"  (172.7 cm)   Weight: 165 lb (74.844 kg)   IBW: 140# %IBW: 118% UBW: 200# %UBW: 62% Body mass index is 25.09 kg/(m^2).  Goal Weight: Maintenance Weight hx: Pt reports UBW of 273# since prior to May 2013, when she stopped drinking. She reports she lost 50# unintentionally right after she stopped drinking. Wt hx reveals a 20.2% wt loss x 6 months and 13.1-19.5% wt loss x 2 months. Pt has lost 18# (9.8%) x approximately 2 weeks.   Wt Readings from Last 10 Encounters:  10/31/12 165 lb (74.844 kg)  10/20/12 183 lb (83.008 kg)  10/05/12 187 lb 3.2 oz (84.913 kg)  08/23/12 181 lb (82.101 kg)  08/16/12 190 lb (86.183 kg)  06/30/12 205 lb 9.6 oz (93.26 kg)  06/14/12 199 lb 9.6 oz (90.538 kg)  05/19/12 207 lb 6.4 oz (94.076 kg)  04/25/12 203 lb (92.08 kg)  04/07/12 211 lb 9.6 oz (95.981 kg)    Estimated nutritional needs:  Kcals/ day: 1914-7829 Protein (grams)/day: 75-112 Fluid (L)/ day: 1.9-2.3  PMH:  Past Medical History  Diagnosis Date  . Brain aneurysm   . IBS (irritable bowel syndrome)   . GERD (gastroesophageal reflux disease)   . Panic attack   . Anxiety   . Psoriatic arthritis   . ETOH abuse     Quit in 07/2011   . Sjogren's disease   . Cirrhosis     Presumably alcoholic cirrhosis  . Complication of anesthesia   . PONV (postoperative nausea and vomiting)   . HTN (hypertension)     no longer medicated, dated on 04/25/12    Medications:  Current Outpatient Rx  Name  Route  Sig  Dispense  Refill  . bumetanide (BUMEX) 1 MG tablet      1 PO TID   90 tablet   11   . escitalopram (LEXAPRO) 10 MG tablet   Oral   Take 20 mg by mouth daily.          . folic acid (FOLVITE) 1 MG tablet      TAKE (1) TABLET BY MOUTH EACH MORNING.   30 tablet   3   . NASONEX  50 MCG/ACT nasal spray   Nasal   Place 2 sprays into the nose as needed. For allergies         . NEXIUM 40 MG capsule      TAKE 1 CAPSULE DAILY BEFORE BREAKFAST   30 capsule   11   . rifaximin (XIFAXAN) 550 MG TABS   Oral   Take 1 tablet (550 mg total) by mouth 2 (two) times daily.   62 tablet   11   . Simethicone (GAS-X EXTRA STRENGTH) 125 MG CAPS   Oral   Take 1 capsule by mouth daily.         Marland Kitchen spironolactone (ALDACTONE) 100 MG tablet      1 PO tid   90 tablet   11   . Tetrahydrozoline HCl (VISINE OP)   Ophthalmic   Apply 2 drops to eye 2 (two) times daily.           Labs: CMP     Component Value Date/Time   NA 136 10/20/2012 1225   NA 138 07/30/2011 0741   K 4.8 10/20/2012 1225   K 3.8 07/30/2011 0741  CL 99 10/20/2012 1225   CO2 27 10/20/2012 1225   GLUCOSE 93 10/20/2012 1225   BUN 11 10/20/2012 1225   BUN 4 07/30/2011 0741   CREATININE 0.95 10/20/2012 1225   CREATININE 0.70 10/08/2011 1200   CALCIUM 9.6 10/20/2012 1225   PROT 6.7 10/20/2012 1225   ALBUMIN 3.3* 10/20/2012 1225   AST 40* 10/20/2012 1225   AST 171 07/30/2011 0741   ALT 23 10/20/2012 1225   ALKPHOS 98 10/20/2012 1225   ALKPHOS 111 07/30/2011 0741   BILITOT 1.2 10/20/2012 1225   BILITOT 3.6 07/30/2011 0741   GFRNONAA >90 10/08/2011 1200   GFRAA >90 10/08/2011 1200    Lipid Panel  No results found for this basename: chol, trig, hdl, cholhdl, vldl, ldlcalc     No results found for this basename: HGBA1C   Lab Results  Component Value Date   CREATININE 0.95 10/20/2012     Lifestyle/ social habits: Ms. Gahm is a delightful woman who resides in [place] with her husband, Primitivo Gauze, of 34 years. She retired in 2010 from the [employer]. Stress level is high, due to multiple medical conditions. Pt discloses that fatigue, arthritis, and depression are very concerning for her. She is currently under the care of a mental health professional. She reports she stopped drinking in May 2013.  Pt with  increased physical activity. She reports she was wheelchair-bound 1 year ago and has worked with PT. She able to walk. Due to pain and fatigue, it is hard for her to be active, but reports she takes small steps to improve her activity level. She currently walks her dog 3 times per week. She reports that she sees a [facility] and does acupuncture (Dr. Ladona Ridgel) and this has helped greatly.   Nutrition hx/habits: Ms. Gladwin reports that she is has cut her salt intake by 50% in the past year. She is concerned about sodium and protein intake. She reports to me that she has eliminated most proteins from her diet as she is afraid that this will affect her cirrhosis. She reports an improved appetite. She admits to occasional swallowing difficulties. She reports having a cerebral aneurysm in 1998, which greatly affected her swallowing, but the problem improved, however, she reports in the past months or so she has had occasionally swallowing difficulty, stating her swallow is not as strong and may have to swallow twice to get food down.  She reports usual beverage intake as mainly water, but questions about adding tea and coffee into her diet. Diet consists mainly of fruits and vegetables; limited protein sources in diet other than Quest nutrition bars. She eats 3 meals per day, but admits to snacking throughout the day, mainly on fruits and vegetables dipped in ranch dressing. She reports she makes "deals with self" to help improve her nutritional intake (for example, she will only eat 1/2 of a personal pizza to limit sodium content or will eat only 1/2 of a baked potato if she decides to use butter and sour cream on it) She reports she eats out daily for socialization and usually chooses a meat and 2 vegetables.  Limited physical exam reveals moderate wasting at temple and clavicle  Pt meets criteria for severe MALNUTRITION in the context of acute illness or injury as evidenced by 20.2% wt loss x 6 months, muscle  loss.   Diet recall: Breakfast: Quest Bar and fruit, water; Lunch: meat and 2 vegetables at [employer]; Dinner: homemade black bean salsa and tortilla chips. Snacks include fruit  OR vegetables dipped in ranch dressing  Nutrition Diagnosis: Inadequate protein intake r/t limited food acceptance AEB diet recall, 20.2% wt loss x 6 months, muscle wasting.   Nutrition Intervention: Nutrition rx: 1875-2250 kcal low sodium diet; aim for 75-112 grams protein daily; 3 meals per day; snacks with protein included to supplement intake; avoid adding salt to table or while cooking; physical activity as tolerated  Education/Counseling Provided:  Understanding, Motivation, Ability to Follow Recommendations: Expect fair to good compliance.   Monitoring and Evaluation: Goals: 1) Weight maintenance; 2) 1 serving protein at each meal and snack; 3) 2000 mg or less of NA daily; 4) Physical activity as tolerated  Recommendations: 1) Liberalize diet when appetite is poor, be more selective in times of good appetite; 2) Add peanut butter to celery and fruits for added protein; 3) Try yogurt at breakfast and snacks; 4) Use Greek yogurt in sauces and dressings in place of sour cream for added protein; 5) Make large batch of homemade tomato soup and freeze in small portions instead of using canned  F/U: PRN. Pt declined follow-up, but requested business card with contact information for additional questions and follow-up. RD business card given per request.   Melody Haver, RD, LDN 10/31/2012  Appt EndTime: 1610

## 2012-10-31 NOTE — Progress Notes (Signed)
Addednum to Nutrition Assessment on 10/31/12:   Education/Counseling Provided: Educated pt on low sodium diet. Reviewed patient's dietary recall. Provided examples on ways to decrease sodium and fat intake in diet. Discouraged intake of processed foods and use of salt shaker. Encouraged fresh fruits and vegetables as well as whole grain sources of carbohydrates to maximize fiber intake. Also provided examples of ways to increase protein intake including adding yogurt and peanut butter to supplement meals and snacks. Discussed eating smaller, more frequent meals to improve PO intake to prevent weight loss. Encouraged pt to liberalize diet during times of bad appetite in order to improve PO intake. Provided AND Nutrition Care Manual's "Low Sodium Nutrition therapy" handout. Teachback method used.   Melody Haver, RD, LDN Pager: [phone]

## 2012-11-07 ENCOUNTER — Other Ambulatory Visit: Payer: Self-pay | Admitting: Gastroenterology

## 2012-11-07 NOTE — Progress Notes (Unsigned)
Pt called and said she seen Dr. Dareen Piano for recommendations for arthritis. He basically said that he could not approve any  of the arthritis meds because of their side effects. He told her OK to do the acupuncture and chiropractor, but as for the meds, he could not recommend them. She just wanted to [facility] Dr. Darrick Penna about this.

## 2012-11-07 NOTE — Telephone Encounter (Deleted)
Pt called and said she saw Dr. Dareen Piano for arthritis concerns. She said he basically said that he cannot recommend any of the arthritis meds because of the side affects . Said just keep doing the alternatives as acupuncture and chiropractor. She just wanted Dr. Darrick Penna to know what he said.

## 2012-11-08 NOTE — Progress Notes (Signed)
REVIEWED.  

## 2012-11-15 ENCOUNTER — Telehealth: Payer: Self-pay

## 2012-11-15 ENCOUNTER — Other Ambulatory Visit: Payer: Self-pay | Admitting: Gastroenterology

## 2012-11-15 DIAGNOSIS — R188 Other ascites: Secondary | ICD-10-CM

## 2012-11-15 NOTE — Telephone Encounter (Signed)
Pt called and said she MIGHT need a paracentesis within the next week or two. York Spaniel it is hard to tell the way things have changed. She has no edema. Feels full to both sides. Weighs 169 now and was between 162-164 after last fluid was drawn off. She said she might need OV for Dr. Darrick Penna or extender to assess and advise. She is just uncertain at this time. Please advise!

## 2012-11-15 NOTE — Telephone Encounter (Signed)
PLEASE CALL PT. She may have large volume paracentesis MON OR TUE. HE SHOULD HOLD HER DIURETICS TOM THE DAY OF AND DAY AFTER HER PROCEDURE. SHE NEEDS TO STRICTLY FOLLOW A LOW SALT DIET.

## 2012-11-15 NOTE — Telephone Encounter (Signed)
LMOM that Deanna Thompson will schedule for next mon or tues.

## 2012-11-15 NOTE — Telephone Encounter (Signed)
Patient is scheduled for Monday Aug 25th at 11:00 for LV Para

## 2012-11-15 NOTE — Telephone Encounter (Signed)
Called pt again. Left all of the message on VM.

## 2012-11-17 ENCOUNTER — Telehealth (HOSPITAL_COMMUNITY): Payer: Self-pay | Admitting: Dietician

## 2012-11-17 NOTE — Telephone Encounter (Signed)
Received voicemail from pt left at 1220. She reports that this morning she was shaky and sweaty. She ate some grapes and blood sugar was 84 after eating grapes.

## 2012-11-17 NOTE — Telephone Encounter (Signed)
Called back at 1440. Pt reports that intake has been good the past few days. She ate a bagel with cream cheese and grapes around 8 AM. She started feeling shaky around 11 AM and ate grapes and checked blood sugar 984 after eating grapes). She reports this was an isolate incident. Advised pt to continue monitoring diet. Discussed ways to prevent hypoglycemic episodes, such as adequate intake and and avoiding going long period of time without eating. Advised pt to continue to monitor symptoms and to discuss concerns with PCP at next appointment if issue continues.

## 2012-11-21 ENCOUNTER — Ambulatory Visit (HOSPITAL_COMMUNITY)
Admission: RE | Admit: 2012-11-21 | Discharge: 2012-11-21 | Disposition: A | Discharge status: Home / Self Care | Payer: BC Managed Care – PPO | Source: Ambulatory Visit | Attending: Gastroenterology | Admitting: Gastroenterology

## 2012-11-21 ENCOUNTER — Encounter (HOSPITAL_COMMUNITY): Payer: Self-pay

## 2012-11-21 VITALS — BP 122/71 | HR 72 | Temp 97.8°F | Resp 16

## 2012-11-21 DIAGNOSIS — R188 Other ascites: Secondary | ICD-10-CM | POA: Insufficient documentation

## 2012-11-21 NOTE — Progress Notes (Signed)
Paracentesis complete 2800 mls amber colored abdominal fluid removed

## 2012-11-21 NOTE — Progress Notes (Signed)
Pt for right side paracentesis with Dr.Blietz. 1 % Lidocaine  5ml given by MD. Pt tolerating procedure no signs of distress

## 2012-12-02 ENCOUNTER — Ambulatory Visit (INDEPENDENT_AMBULATORY_CARE_PROVIDER_SITE_OTHER): Payer: BC Managed Care – PPO | Admitting: General Surgery

## 2012-12-02 ENCOUNTER — Encounter (INDEPENDENT_AMBULATORY_CARE_PROVIDER_SITE_OTHER): Payer: Self-pay | Admitting: General Surgery

## 2012-12-02 VITALS — BP 102/64 | HR 72 | Resp 14 | Ht 68.0 in | Wt 177.4 lb

## 2012-12-02 DIAGNOSIS — K4090 Unilateral inguinal hernia, without obstruction or gangrene, not specified as recurrent: Secondary | ICD-10-CM

## 2012-12-02 DIAGNOSIS — K429 Umbilical hernia without obstruction or gangrene: Secondary | ICD-10-CM

## 2012-12-02 NOTE — Progress Notes (Signed)
Subjective:     Patient ID: Deanna Thompson, female   DOB: 08/13/1955, 58 y.o.   MRN: [id number]  HPI The patient is a 57 year old female is well-known to me with a history of alcoholic hepatitis as well as cirrhosis and recurrent ascites with ultrasound-guided drainage.  Patient presents today for evaluation of her inguinal hernia as well as umbilical hernia. She states that her paracentesis levels have been going down. His most recent was in August of 2750 cc. The patient states that she feels that the hernia in the inguinal area is getting bigger. She still states that it's sometimes burning in nature.  Review of Systems  Constitutional: Negative.   HENT: Negative.   Respiratory: Negative.   Cardiovascular: Negative.   Gastrointestinal: Negative.   Endocrine: Negative.   Neurological: Negative.   All other systems reviewed and are negative.       Objective:   Physical Exam  Constitutional: She is oriented to person, place, and time. She appears well-developed and well-nourished.  HENT:  Head: Normocephalic and atraumatic.  Eyes: Conjunctivae and EOM are normal. Pupils are equal, round, and reactive to light.  Neck: Normal range of motion. Neck supple.  Cardiovascular: Normal rate, regular rhythm and normal heart sounds.   Pulmonary/Chest: Effort normal.  Abdominal: Soft. Bowel sounds are normal. A hernia is present. Hernia confirmed positive in the right inguinal area (reducible).    Musculoskeletal: Normal range of motion.  Neurological: She is alert and oriented to person, place, and time.  Skin: Skin is warm and dry.       Assessment:     57 year old female with an umbilical hernia, and a right inguinal hernia, both reducible.     Plan:     1. Discussed with her that I believe this is still the right time to proceed with any surgery secondary to the fact she continues with high paracentesis rates. I stated that if the ascites decreases significantly worse he is not  having the paracentesis drainage we can consider inguinal hernia repair. The patient has not had any issues with an umbilical hernia however should this become in danger of eroding will fix that at that time.  2. The patient follow up as needed.

## 2012-12-07 ENCOUNTER — Telehealth: Payer: Self-pay

## 2012-12-07 NOTE — Telephone Encounter (Signed)
Pt called and said she has broke out in welts in the last hour that has covered her body, she said it has happened before and she thinks she might be allergic to red meat. She wanted advise from Dr. Darrick Penna, and I told her she has left for the day. I told her if it is that bad she might could go to night clinic at [facility] if they still have it. She said she does not think that they do, but she might try the urgent care. York Spaniel it is driving her crazy, and it came so suddenly. No other complaints.

## 2012-12-08 ENCOUNTER — Other Ambulatory Visit: Payer: Self-pay | Admitting: Gastroenterology

## 2012-12-08 NOTE — Telephone Encounter (Signed)
Good to hear

## 2012-12-08 NOTE — Telephone Encounter (Signed)
Called and informed pt. She did not go to urgent care. Rash is gone today. She has an appt the end of Sept with an allergist. She is not having any jaundice problems or ascite problems.

## 2012-12-08 NOTE — Telephone Encounter (Signed)
Is she having issues with jaundice? Would recommend seeing PCP; did she go to urgent care? Avoid all fragrance deodorants, detergents, soaps, lotions.

## 2012-12-12 ENCOUNTER — Telehealth: Payer: Self-pay

## 2012-12-12 NOTE — Telephone Encounter (Signed)
Pt left VM that she is doing very well now. She is at 171 lbs. Said her belly is flat. No edema in extremities. She just doesn't know exactly what to do. She is planning to go out of town next Sun -Wed ( sept 21-24). Please advise if she needs to come to the office or wait. She just feels like she is not ready at this time for paracentesis but a little concerned with planning to be our of town. Please advise!

## 2012-12-12 NOTE — Telephone Encounter (Signed)
Called and informed pt.  

## 2012-12-12 NOTE — Telephone Encounter (Signed)
PLEASE CALL PT. If she has a flat stomach, she doesn't have enough fluid to have a paracentesis. She should call me while on vacation if she develops significant swelling. We can see what we can work out.

## 2012-12-16 ENCOUNTER — Telehealth: Payer: Self-pay | Admitting: Gastroenterology

## 2012-12-16 ENCOUNTER — Ambulatory Visit (HOSPITAL_COMMUNITY)
Admission: RE | Admit: 2012-12-16 | Discharge: 2012-12-16 | Disposition: A | Discharge status: Home / Self Care | Payer: BC Managed Care – PPO | Source: Ambulatory Visit | Attending: Gastroenterology | Admitting: Gastroenterology

## 2012-12-16 ENCOUNTER — Other Ambulatory Visit: Payer: Self-pay | Admitting: Gastroenterology

## 2012-12-16 DIAGNOSIS — R109 Unspecified abdominal pain: Secondary | ICD-10-CM | POA: Insufficient documentation

## 2012-12-16 DIAGNOSIS — K7460 Unspecified cirrhosis of liver: Secondary | ICD-10-CM

## 2012-12-16 DIAGNOSIS — K831 Obstruction of bile duct: Secondary | ICD-10-CM | POA: Insufficient documentation

## 2012-12-16 DIAGNOSIS — K8020 Calculus of gallbladder without cholecystitis without obstruction: Secondary | ICD-10-CM | POA: Insufficient documentation

## 2012-12-16 LAB — URINALYSIS, ROUTINE W REFLEX MICROSCOPIC
Bilirubin Urine: NEGATIVE
Glucose, UA: NEGATIVE mg/dL
Specific Gravity, Urine: <1.005 — ABNORMAL LOW (ref 1.005–1.030)
Urobilinogen, UA: 0.2 mg/dL (ref 0.0–1.0)

## 2012-12-16 LAB — COMPLETE METABOLIC PANEL WITH GFR
ALT: 22 U/L (ref 0–35)
Albumin: 3.9 g/dL (ref 3.5–5.2)
Alkaline Phosphatase: 121 U/L — ABNORMAL HIGH (ref 39–117)
GFR, Est Non African American: 55 mL/min — ABNORMAL LOW
Glucose, Bld: 135 mg/dL — ABNORMAL HIGH (ref 70–99)
Potassium: 4.1 mEq/L (ref 3.5–5.3)
Sodium: 136 mEq/L (ref 135–145)
Total Protein: 7.5 g/dL (ref 6.0–8.3)

## 2012-12-16 LAB — URINALYSIS, MICROSCOPIC ONLY: Bacteria, UA: NONE SEEN

## 2012-12-16 LAB — CBC WITH DIFFERENTIAL/PLATELET
Basophils Absolute: 0.1 10*3/uL (ref 0.0–0.1)
Basophils Relative: 1 % (ref 0–1)
Eosinophils Absolute: 0.2 10*3/uL (ref 0.0–0.7)
Eosinophils Relative: 3 % (ref 0–5)
HCT: 38.9 % (ref 36.0–46.0)
Hemoglobin: 13.7 g/dL (ref 12.0–15.0)
MCH: 30.7 pg (ref 26.0–34.0)
MCHC: 35.2 g/dL (ref 30.0–36.0)
MCV: 87.2 fL (ref 78.0–100.0)
Monocytes Absolute: 0.7 10*3/uL (ref 0.1–1.0)
Monocytes Relative: 10 % (ref 3–12)
RDW: 14 % (ref 11.5–15.5)

## 2012-12-16 MED ORDER — IOHEXOL 300 MG/ML  SOLN
100.0000 mL | Freq: Once | INTRAMUSCULAR | Status: AC | PRN
  Administered 2012-12-16: 100 mL via INTRAVENOUS

## 2012-12-16 NOTE — Telephone Encounter (Addendum)
C/o severe abdominal cramps. Quit eating yogurt about a week ago. PT DENIES FEVER, CHILLS, BRBPR, vomiting, melena, diarrhea,OR constipation, problems swallowing FROM BRAIN SURGERY. HAS MILD HEARTBURN. ABDOMEN NOT SWOLLEN. BETTER WITH GAS-X. PAIN MAY LAST HOURS IF SHE DOESN'T TAKE A GAS-X. PAIN SEVERE ENOUGH TO DOUBLE HER OVER. SUBJECTIVE CHILLS. NO CHANGE IN BOWEL HABITS. NO LACTULOSE OR MIRALAX. TAKING GAS-X. MILD NAUSEA. NO DYSURIA OR HEMATURIA. PAIN STARTS RANDOMLY.  USU. NEVER HAPPENS IN THE AM. STARTS AROUND 3 OR 4 PM. NOT ASSOCIATED WITH FOOD. IMMEDIATE AND IT'S ALL OVER(STERNUM TO PELVIS). "INGUINAL HERNIA HURTS WHEN SHE HAS THE PAIN." ? MILD BLADDER INFECTION.   PT NEEDS STAT LABS/UA AND CT SCAN OF ABD/PELVIS TODAY.

## 2012-12-16 NOTE — Telephone Encounter (Signed)
I spoke to Ms. [facility] and she is aware to do STAT labs first and then go straight to Radiology for STAT CT

## 2012-12-16 NOTE — Telephone Encounter (Signed)
Pt called this morning asking for SF or DS. Patient said that for the past 3-4 days she has been having severe abdominal cramps where she's doubled over. It happens usually mid morning and late afternoon. She is very concern and wanting to be advise what she should do before we leave for the weekend. Please advise and call patient at [phone] (she did say that taking Gas X helps some)

## 2012-12-17 NOTE — Telephone Encounter (Addendum)
Called patient TO DISCUSS RESULTS. ABD PAIN MAY BE DUE TO CONSTIPATION. CMP/CBC/UA NOT CLINICALLY SIGNIFICANT. CT SHOWS INDETERMINATE LIVER LESION, GALLSTONES, MILD ASCITES, AND STOOL. PT NEEDS TO TAKE MOM BID FOR 3 DAYS. WILL START TOMORROW BECAUSE SHE IS GOING OUT OF TOWN. EXPLAINED SHE NEEDS AN MRI TO LOOK AT HER LIVER WHEN SHE GETS BACK. CR UP. ALBUMIN IMPROVED. REDUCE DIURETICS TO BID.

## 2012-12-17 NOTE — Assessment & Plan Note (Signed)
CT ? R HEPATIC LOBE LESION  MRI LIVER W/ EOVIST

## 2012-12-17 NOTE — Addendum Note (Signed)
Addended by: [place] on: 12/17/2012 01:11 PM   Modules accepted: Orders

## 2012-12-19 LAB — POCT I-STAT, CHEM 8
BUN: 15 mg/dL (ref 6–23)
Calcium, Ion: 1.12 mmol/L (ref 1.12–1.23)
Chloride: 95 mEq/L — ABNORMAL LOW (ref 96–112)
HCT: 44 % (ref 36.0–46.0)
Sodium: 135 mEq/L (ref 135–145)

## 2012-12-21 ENCOUNTER — Encounter: Payer: Self-pay | Admitting: Gastroenterology

## 2012-12-21 NOTE — Telephone Encounter (Signed)
[name] called back and confirmed and understands instructions

## 2012-12-21 NOTE — Telephone Encounter (Signed)
Patient called and stated that she is not supposed to do MRI of any kind due to metal clips that's in her head from an aneurysm she had in her brain. Dr. Ilean Skill is the Dr. Claiborne Billings put the clip in in 1998

## 2012-12-21 NOTE — Addendum Note (Signed)
Addended by: [place] on: 12/21/2012 03:11 PM   Modules accepted: Orders

## 2012-12-21 NOTE — Telephone Encounter (Signed)
Patient is scheduled for CT on Friday October 3rd at 1:00. She does need to go by Radiology and pick up oral contrast ahead of time. I have LMOM for her to call me back to confirm date & time

## 2012-12-21 NOTE — Telephone Encounter (Addendum)
PLEASE CALL PT. I WILL CANCEL THE MRI AND ORDER A CT OF THE ABDOMEN-LIVER PROTOCOL. THE CT SHOULD BE DONE AFTER OCT 1 DUE TO HER RECEIVING DYE ON SEP 19. SHE SHOULD HOLD HER DIURETICS ON THE DAY OF AND THE DAY AFTER HER CT TO HELP PREVENT INJURY TO HER KIDNEYS FROM IV DYE.  DISCUSSED WITH DR. Tyron Russell.

## 2012-12-22 NOTE — Telephone Encounter (Signed)
Pt called and wanted to let you know she can't have her procedure on 10/3, however she could do anyday the week of 10/6 & 10/13.  She said you could just schedule any day that week and give her a call with the new date.

## 2012-12-22 NOTE — Telephone Encounter (Signed)
R/S CT to 10/6 at 1:45

## 2012-12-22 NOTE — Telephone Encounter (Signed)
REVIEWED.  

## 2012-12-27 ENCOUNTER — Telehealth: Payer: Self-pay

## 2012-12-27 NOTE — Telephone Encounter (Signed)
Pt called and said she is still having abdominal cramps, and sometimes severe. It is usually within an hour after she eats. The gas -x helps a lot and she wants to know if it is ok to take the gas-x 3-4 times a day. She said that seems to help a lot, she just does not know what is causing it. She is eating something all during the day, yogurt, grapes or lemon ice. Please advise, She has a doctor's appt in [place] this Am and will be gone, but OK to leave a message on her VM at home.

## 2012-12-29 NOTE — Telephone Encounter (Signed)
LATE ENTRY: Pt called at about 3:45 pm yesterday ( 12/28/2012).  FYI .Marland KitchenMarland KitchenMarland KitchenFOR DR. 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 Darrick Penna before  She adds new medication.  Please advise!

## 2012-12-30 ENCOUNTER — Other Ambulatory Visit: Payer: Self-pay | Admitting: Gastroenterology

## 2012-12-30 ENCOUNTER — Ambulatory Visit (HOSPITAL_COMMUNITY): Payer: BC Managed Care – PPO

## 2012-12-30 DIAGNOSIS — R188 Other ascites: Secondary | ICD-10-CM

## 2012-12-30 NOTE — Telephone Encounter (Signed)
SPOKE WITH PT. MOM WORKS. STILL HAVING PAIN. HAPPEN LATER IN THE DAY. MAY OR MAY NO BE TRIGGERED BY BY FOOD. IT GOES DOWN THE LEFT SIDE.  PAIN IN HERNIA. WEIGHT INCHING UP SINCE BACKING OFF ON THE DIURETIC. BELLY LOOK BIGGER. UMBILICAL HERNIA GOES IN AND OUT. CT LIVER PROTOCOL NEXT WEEK. NOT TAKING A PROBIOTIC. IS TAKING BEANO.   PLAN: 1. COMPLETE CT 2. TAKE ALIGN OR PHILLIP'S COLON HEALTH. CONTINUE BEANO AND GAS-X. 3. CONSIDER SBFT 4. TCS WITHIN THE NEXT MO 5. ARRANGE FOR PARACENTESIS FOR MON OCT 6 BEFORE CT SCAN.  SHE SHOULD HOLD HER DIURETICS ON THE DAY OF AND DAY AFTER HER PROCEDURE.

## 2012-12-30 NOTE — Telephone Encounter (Signed)
Patient is also scheduled for U/S Para the same day

## 2013-01-02 ENCOUNTER — Ambulatory Visit (HOSPITAL_COMMUNITY)
Admission: RE | Admit: 2013-01-02 | Discharge: 2013-01-02 | Disposition: A | Discharge status: Home / Self Care | Payer: BC Managed Care – PPO | Source: Ambulatory Visit | Attending: Gastroenterology | Admitting: Gastroenterology

## 2013-01-02 ENCOUNTER — Other Ambulatory Visit: Payer: Self-pay | Admitting: Gastroenterology

## 2013-01-02 DIAGNOSIS — R188 Other ascites: Secondary | ICD-10-CM

## 2013-01-02 DIAGNOSIS — K7460 Unspecified cirrhosis of liver: Secondary | ICD-10-CM | POA: Insufficient documentation

## 2013-01-02 MED ORDER — IOHEXOL 300 MG/ML  SOLN
100.0000 mL | Freq: Once | INTRAMUSCULAR | Status: AC | PRN
  Administered 2013-01-02: 100 mL via INTRAVENOUS

## 2013-01-06 NOTE — Telephone Encounter (Addendum)
Called DR. BOLES TO DISCUSS CT. NONSPECIFIC LIVER LESION.. REPEAT CT LIVER PROTOCOL IN 4 MOS. PT UNABLE TO HAVE AN MRI. Called patient TO DISCUSS RESULTS. LVM-CALL [phone] TO DISCUSS.   1434: PAIN BETTER WITH 4 GAS-X TODAY. WANTS TO KNOW IF SHE CAN TAKE AN ADVIL.   1. HASN'T GOTTEN A PROBIOTIC YET. ALIGN OR PHILLIP'S COLON HEALTH. CONTINUE BEANO AND GAS-X. 2. TCS WITHIN THE NEXT MO-MOVIPREP. HOLD DIURETICS THE PM BEFORE AND AM OF TCS. 3. MAY USE IBUPROFEN OTC 200 MG PRN

## 2013-01-09 ENCOUNTER — Other Ambulatory Visit: Payer: Self-pay | Admitting: Gastroenterology

## 2013-01-09 ENCOUNTER — Encounter (HOSPITAL_COMMUNITY): Payer: Self-pay | Admitting: Pharmacy Technician

## 2013-01-09 DIAGNOSIS — K7460 Unspecified cirrhosis of liver: Secondary | ICD-10-CM

## 2013-01-09 DIAGNOSIS — R109 Unspecified abdominal pain: Secondary | ICD-10-CM

## 2013-01-09 MED ORDER — PEG-KCL-NACL-NASULF-NA ASC-C 100 G PO SOLR: 1.0000 | ORAL | Status: DC

## 2013-01-09 NOTE — Telephone Encounter (Signed)
I have patient scheduled for TCS w/SLF on 10/21 I will be mailing her the instructions for [employer] once Tyler Aas gives patient results

## 2013-01-09 NOTE — Telephone Encounter (Signed)
PT is aware, she had talked to Dr. Darrick Penna on Friday or Sat.

## 2013-01-17 ENCOUNTER — Ambulatory Visit (HOSPITAL_COMMUNITY)
Admission: RE | Admit: 2013-01-17 | Discharge: 2013-01-17 | Disposition: A | Discharge status: Home / Self Care | Payer: BC Managed Care – PPO | Source: Ambulatory Visit | Attending: Gastroenterology | Admitting: Gastroenterology

## 2013-01-17 ENCOUNTER — Encounter (HOSPITAL_COMMUNITY): Payer: Self-pay | Admitting: *Deleted

## 2013-01-17 ENCOUNTER — Encounter (HOSPITAL_COMMUNITY)
Admission: RE | Disposition: A | Discharge status: Home / Self Care | Payer: Self-pay | Source: Ambulatory Visit | Attending: Gastroenterology

## 2013-01-17 DIAGNOSIS — D126 Benign neoplasm of colon, unspecified: Secondary | ICD-10-CM | POA: Insufficient documentation

## 2013-01-17 DIAGNOSIS — K648 Other hemorrhoids: Secondary | ICD-10-CM

## 2013-01-17 DIAGNOSIS — R109 Unspecified abdominal pain: Secondary | ICD-10-CM

## 2013-01-17 DIAGNOSIS — K7460 Unspecified cirrhosis of liver: Secondary | ICD-10-CM

## 2013-01-17 DIAGNOSIS — I10 Essential (primary) hypertension: Secondary | ICD-10-CM | POA: Insufficient documentation

## 2013-01-17 DIAGNOSIS — K649 Unspecified hemorrhoids: Secondary | ICD-10-CM | POA: Insufficient documentation

## 2013-01-17 HISTORY — PX: COLONOSCOPY: SHX5424

## 2013-01-17 SURGERY — COLONOSCOPY
Anesthesia: Moderate Sedation

## 2013-01-17 MED ORDER — STERILE WATER FOR IRRIGATION IR SOLN
Status: DC | PRN
  Administered 2013-01-17: 13:00:00

## 2013-01-17 MED ORDER — MEPERIDINE HCL 100 MG/ML IJ SOLN
INTRAMUSCULAR | Status: AC
  Filled 2013-01-17: qty 2

## 2013-01-17 MED ORDER — MEPERIDINE HCL 100 MG/ML IJ SOLN
INTRAMUSCULAR | Status: DC | PRN
  Administered 2013-01-17: 25 mg via INTRAVENOUS
  Administered 2013-01-17 (×2): 50 mg via INTRAVENOUS

## 2013-01-17 MED ORDER — MIDAZOLAM HCL 5 MG/5ML IJ SOLN
INTRAMUSCULAR | Status: AC
  Filled 2013-01-17: qty 10

## 2013-01-17 MED ORDER — DIPHENHYDRAMINE HCL 50 MG/ML IJ SOLN
INTRAMUSCULAR | Status: DC | PRN
  Administered 2013-01-17: 12.5 mg via INTRAVENOUS

## 2013-01-17 MED ORDER — MIDAZOLAM HCL 5 MG/5ML IJ SOLN
INTRAMUSCULAR | Status: DC | PRN
  Administered 2013-01-17: 2 mg via INTRAVENOUS
  Administered 2013-01-17: 1 mg via INTRAVENOUS
  Administered 2013-01-17 (×2): 2 mg via INTRAVENOUS

## 2013-01-17 MED ORDER — SODIUM CHLORIDE 0.9 % IV SOLN
INTRAVENOUS | Status: DC
  Administered 2013-01-17: 12:00:00 via INTRAVENOUS

## 2013-01-17 MED ORDER — DIPHENHYDRAMINE HCL 50 MG/ML IJ SOLN
INTRAMUSCULAR | Status: AC
  Filled 2013-01-17: qty 1

## 2013-01-17 NOTE — H&P (Signed)
Primary Care Physician:  Jonette Eva, MD Primary Gastroenterologist:  Dr. Darrick Penna  Pre-Procedure History & Physical: HPI:  Deanna Thompson is a 57 y.o. female here for ABDOMINAL PAIN/ PERSONAL HISTORY OF POLYPS.  Past Medical History  Diagnosis Date  . Brain aneurysm     METAL COIL PREVENTS MRI IMAGING  . IBS (irritable bowel syndrome)   . GERD (gastroesophageal reflux disease)   . Panic attack   . Anxiety   . Psoriatic arthritis   . ETOH abuse     Quit in 07/2011   . Sjogren's disease   . Cirrhosis     Presumably alcoholic cirrhosis  . Complication of anesthesia   . PONV (postoperative nausea and vomiting)   . HTN (hypertension)     no longer medicated, dated on 04/25/12    Past Surgical History  Procedure Laterality Date  . Cerebral aneurysm repair    . Esophageal biopsy  10/13/2011    SLF: mild gastritis/duodenal polypoid lesion   . Colonoscopy  10/13/2011    WUJ:[id number] hemorrhoids/varices rectal/lesion in ascending colon(HYPERPLASTIC POLYP)  . Paracentesis  Feb 2014    10.7 liters    Prior to Admission medications   Medication Sig Start Date End Date Taking? Authorizing Provider  bumetanide (BUMEX) 1 MG tablet Take 1 mg by mouth 3 (three) times daily. 10/05/12  Yes West Bali, MD  Cholecalciferol (VITAMIN D-3) 5000 UNITS TABS Take 5,000 mg by mouth daily.   Yes Historical Provider, MD  esomeprazole (NEXIUM) 40 MG capsule Take 40 mg by mouth daily before breakfast.   Yes Historical Provider, MD  folic acid (FOLVITE) 1 MG tablet Take 1 mg by mouth daily.   Yes Historical Provider, MD  loratadine (CLARITIN) 10 MG tablet Take 10 mg by mouth daily.   Yes Historical Provider, MD  NASONEX 50 MCG/ACT nasal spray Place 2 sprays into the nose daily as needed (Allergies).  06/23/11  Yes Historical Provider, MD  peg 3350 powder (MOVIPREP) 100 G SOLR Take 1 kit (200 g total) by mouth as directed. PHARMACIST USE THE FOLLOWING FOR PATIENT DISCOUNT BIN: [id number] GROUP:  [id number] ID: [id number] PATIENTS WILL SAVE UP TO $10 ON THEIR OUT-OF-POCKET EXPENSE FOR PROCESSING QUESTIONS, CALL [phone] 01/09/13  Yes West Bali, MD  rifaximin (XIFAXAN) 550 MG TABS tablet Take 550 mg by mouth 2 (two) times daily.   Yes Historical Provider, MD  Simethicone (GAS-X EXTRA STRENGTH) 125 MG CAPS Take 1 capsule by mouth daily.   Yes Historical Provider, MD  sodium chloride (OCEAN) 0.65 % nasal spray Place 1 spray into the nose as needed for congestion.   Yes Historical Provider, MD  spironolactone (ALDACTONE) 100 MG tablet Take 100 mg by mouth 3 (three) times daily. 10/05/12  Yes West Bali, MD  escitalopram (LEXAPRO) 20 MG tablet Take 20 mg by mouth daily.    Historical Provider, MD  Tetrahydrozoline HCl (VISINE OP) Apply 2 drops to eye 2 (two) times daily.    Historical Provider, MD    Allergies as of 01/09/2013 - Review Complete 01/09/2013  Allergen Reaction Noted  . Omeprazole Shortness Of Breath 08/19/2011  . Other Shortness Of Breath and Rash 01/09/2013  . Phenytoin Rash   . Cephalexin Other (See Comments)   . Phenergan [promethazine hcl] Other (See Comments) 04/08/2012    Family History  Problem Relation Age of Onset  . Cirrhosis Mother     nash  . Colon cancer Neg Hx   . Inflammatory bowel disease Neg  Hx   . Asthma    . Heart disease    . COPD Father   . Heart disease Father     History   Social History  . Marital Status: Married    Spouse Name: N/A    Number of Children: 0  . Years of Education: N/A   Occupational History  .     Social History Main Topics  . Smoking status: Former Smoker -- 2.00 packs/day for 12 years    Types: Cigarettes    Quit date: 03/31/1995  . Smokeless tobacco: Not on file     Comment: quit in 1997  . Alcohol Use: No     Comment: Bottle of wine a day for years, but quit in 07/2011  . Drug Use: No  . Sexual Activity: Not on file   Other Topics Concern  . Not on file   Social History Narrative   Quit  drinking alcohol 3 weeks ago. No drugs, no IVDU.     Review of Systems: See HPI, otherwise negative ROS   Physical Exam: BP 144/76  Pulse 64  Temp(Src) 98.4 F (36.9 C) (Oral)  Resp 15  Ht 5\' 8"  (1.727 m)  Wt 177 lb (80.287 kg)  BMI 26.92 kg/m2  SpO2 100% General:   Alert,  pleasant and cooperative in NAD Head:  Normocephalic and atraumatic. Neck:  Supple; Lungs:  Clear throughout to auscultation.    Heart:  Regular rate and rhythm. Abdomen:  Soft, nontender and nondistended. Normal bowel sounds, without guarding, and without rebound.   Neurologic:  Alert and  oriented x4;  grossly normal neurologically.  Impression/Plan:     ABDOMINAL PAIN/ PERSONAL HISTORY OF POLYPS.  PLAN: 1. TCS TODAY

## 2013-01-18 NOTE — Op Note (Addendum)
[facility] [address] [place], [address]   COLONOSCOPY PROCEDURE REPORT  PATIENT: Deanna Thompson, Deanna Thompson  MR#: [id number] BIRTHDATE: [birth year] , 56  yrs. old GENDER: Female ENDOSCOPIST: Jonette Eva, MD REFERRED AO:ZHYQM Sherwood Gambler, M.D. PROCEDURE DATE:  01/17/2013 PROCEDURE:   Colonoscopy with snare polypectomy and Colonoscopy with cold biopsy polypectomy INDICATIONS:abdominal pain-PMHx; AC POLYP NOT REMOVED 2013 DUE PT'S ONR > 2. MEDICATIONS: Benadryl 12.5 MG IV, Demerol 125 mg IV, and Versed 7 mg IV  DESCRIPTION OF PROCEDURE:    Physical exam was performed.  Informed consent was obtained from the patient after explaining the benefits, risks, and alternatives to procedure.  The patient was connected to monitor and placed in left lateral position. Continuous oxygen was provided by nasal cannula and IV medicine administered through an indwelling cannula.  After administration of sedation and rectal exam, the patients rectum was intubated and the EC-[id number] ([id number])  colonoscope was advanced under direct visualization to the ileum.  The scope was removed slowly by carefully examining the color, texture, anatomy, and integrity mucosa on the way out.  The patient was recovered in endoscopy and discharged home in satisfactory condition.    COLON FINDINGS: The mucosa appeared normal in the terminal ileum.  , Two sessile polyps measuring 4-6 mm in size were found at the hepatic flexure.  A polypectomy was performed using snare cautery and with cold forceps.  , Small internal hemorrhoids were found.  , SMALL RECTAL VARICES, and The colon was redundant.  Manual abdominal counter-pressure was used to reach the cecum.  The patient was moved on to their back AND STOMACH to reach the cecum.   PREP QUALITY: good.  CECAL W/D TIME: 25 minutes     COMPLICATIONS: None  ENDOSCOPIC IMPRESSION: 1.   Internal hemorrhoids 2.   Ascending colon polyps 3.   Rectal  varices  RECOMMENDATIONS: FOLLOW A HIGH FIBER DIET.  AVOID ITEMS THAT CAUSE BLOATING & GAS. BIOPSY RESULTS SHOULD BE BACK IN 7 DAYS.  Next colonoscopy/?OVERTUBE in 10 years.       _______________________________ Rosalie DoctorJonette Eva, MD 01/18/2013 4:08 PM Revised: 01/18/2013 4:08 PM

## 2013-01-20 ENCOUNTER — Encounter (HOSPITAL_COMMUNITY): Payer: Self-pay | Admitting: Gastroenterology

## 2013-01-23 ENCOUNTER — Telehealth: Payer: Self-pay | Admitting: Gastroenterology

## 2013-01-23 NOTE — Telephone Encounter (Signed)
Please call pt. She had a simple adenoma removed from her colon.   FOLLOW A HIGH FIBER DIET. AVOID ITEMS THAT CAUSE BLOATING & GAS.  Next colonoscopy in 10 years.

## 2013-01-24 NOTE — Telephone Encounter (Signed)
LMOM for a return call.  

## 2013-01-24 NOTE — Telephone Encounter (Signed)
Called and informed pt. She said she had just received a letter that it was time to follow up. She wants to know does she really need to have an appt at this time. Please advise!

## 2013-02-02 NOTE — Telephone Encounter (Signed)
PLEASE CALL PT. OPV FEB 2015 E30 CIRRHOSIS

## 2013-02-02 NOTE — Telephone Encounter (Signed)
Called and informed pt.  

## 2013-02-06 ENCOUNTER — Telehealth: Payer: Self-pay

## 2013-02-06 NOTE — Telephone Encounter (Signed)
Pt called and said she continues to have the abdominal pain/cramps. Last 3 days has been worse. She has some mild nausea with it but no vomiting. Her pain is mostly in the left lower quad and the cramps occur when she stands. She said the cramps are almost debilitating. She has been told that her gallbladder is full of stones and she wonders if that could be causing the problem. Please advise!

## 2013-02-06 NOTE — Telephone Encounter (Signed)
Reminder in epic °

## 2013-02-13 ENCOUNTER — Other Ambulatory Visit: Payer: Self-pay

## 2013-02-13 DIAGNOSIS — R300 Dysuria: Secondary | ICD-10-CM

## 2013-02-13 NOTE — Telephone Encounter (Signed)
Pt called and said that her abdominal pain is beginning to start earlier in the day and last for longer intervals. She has cramps that sometimes double her over, and the level of pain is sometimes at a 7 for awhile several days per week. Please advise!

## 2013-02-13 NOTE — Telephone Encounter (Signed)
Please clarify where the location of her pain is now. Is it still left lower abdomen? If so, not related to gallstones.  Does she have significant ascites? What are her bowel movement like? Any problems urinating?

## 2013-02-13 NOTE — Telephone Encounter (Signed)
I called pt. She said the pain is moving around some now. She has it in the mid left abdomen to belly button area. Some ascites. Weight up to 187 lbs. BM's regular usually bid, not large quantity, and still grape shaped. She has some mild nausea with the pain. She thinks she might have a mild UTI. She has a little pressure and a little burning maybe one day and then goes 3-4 days with no symptoms. Please advise!

## 2013-02-13 NOTE — Telephone Encounter (Signed)
Let's offer her U/A with urine culture. Doubt infection of her ascites. Miralax one capful daily. Discuss with Dr. Darrick Penna on Wednesday. Go to ER if condition worsens.

## 2013-02-13 NOTE — Telephone Encounter (Signed)
LMOM to call. Order faxed to [facility].

## 2013-02-14 NOTE — Telephone Encounter (Signed)
Pt returned call and was informed of the message. She will go to the lab today about lunch time.  She will change the MOM to the Miralax and see how that does. She also wanted Dr. Darrick Penna to know that the abdominal cramps are around her umbilical area and she is not sure if it is related to the umbilical hernia.

## 2013-02-15 LAB — URINALYSIS W MICROSCOPIC + REFLEX CULTURE
Bilirubin Urine: NEGATIVE
Casts: NONE SEEN
Glucose, UA: NEGATIVE mg/dL
Ketones, ur: NEGATIVE mg/dL
Protein, ur: NEGATIVE mg/dL
pH: 6.5 (ref 5.0–8.0)

## 2013-02-15 MED ORDER — CIPROFLOXACIN HCL 500 MG PO TABS: ORAL_TABLET | ORAL | Status: DC

## 2013-02-15 NOTE — Telephone Encounter (Signed)
Called patient TO DISCUSS COMPLAINT. STILL HAVING PAIN AROUND HER WAIST LINE OFF AND ON. RIGHT AT BELT LINE. CAN BE SEVERE, DOUBLING OVER. IT IS AFFECTING HER QUALITY OF LIFE. FEELS MORE LIKE A GAS THIN NEAR LLQ. CRAMPING WAS HAPPENING IN EVENING NOW HAPPENING 4 TO 6 TIMES DAY. SX: LAST SECS. BETTER WITH TIME. MAY HAVE TO STAY A LITTLE BENT OVER AND SOMETIMES SHE HAS TO STRAIGHTEN.  LLQ MAY HURT WHEN SHE HAS A BM. CRAMPING IS BEHIND THE HERNIA OR RIGHT BEHIND IT OR BELOW IT. STERILE PYURIA ON UA. CIPRO BID FOR 3 DAYS. STOP BY U/S TO SEE IF SHE HAS FLUID TO PULL OFF. PT GAINING WEIGHT. HAS BM, BUT FEEL SLIKE SHE NEEDS TO HAVE A BM BUT DOESN'T HAVE.

## 2013-02-16 ENCOUNTER — Telehealth: Payer: Self-pay | Admitting: Gastroenterology

## 2013-02-16 ENCOUNTER — Other Ambulatory Visit: Payer: Self-pay | Admitting: Gastroenterology

## 2013-02-16 ENCOUNTER — Ambulatory Visit (HOSPITAL_COMMUNITY)
Admission: RE | Admit: 2013-02-16 | Discharge: 2013-02-16 | Disposition: A | Discharge status: Home / Self Care | Payer: BC Managed Care – PPO | Source: Ambulatory Visit | Attending: Gastroenterology | Admitting: Gastroenterology

## 2013-02-16 DIAGNOSIS — R188 Other ascites: Secondary | ICD-10-CM

## 2013-02-16 NOTE — Telephone Encounter (Signed)
Routing to Leigh Ann 

## 2013-02-16 NOTE — Telephone Encounter (Signed)
REVIEWED. ASCITES < 1L.

## 2013-02-16 NOTE — Telephone Encounter (Signed)
Done

## 2013-02-16 NOTE — Telephone Encounter (Signed)
I spoke to Tana Coast, PA in Dr. Darrick Penna absence. Order was needed for [place].

## 2013-02-16 NOTE — Telephone Encounter (Signed)
Placed order for u/s guided abdominal paracentesis for today. Patient is going at 11am per [facility].  She will have albumin 12.5mg  IV at onset of tap and additional 12.5mg  IV at 4 liters removed. Fluid for cell count with diff, cultures.

## 2013-02-17 ENCOUNTER — Telehealth (INDEPENDENT_AMBULATORY_CARE_PROVIDER_SITE_OTHER): Payer: Self-pay

## 2013-02-17 NOTE — Telephone Encounter (Signed)
Patient return call to our office.  Patient given appointment for 02/28/13 @ 5:10 pm w/Dr. Derrell Lolling.  Patient advised that she will be further assess at that time.  Patient advised that studies were negative for fluid collection per Dr. Darrick Penna.  Patient verbalized understanding and okay with appointment time given.

## 2013-02-17 NOTE — Telephone Encounter (Signed)
Message copied by Maryan Puls on Fri Feb 17, 2013  3:35 PM ------      Message from: Axel Filler      Created: Fri Feb 17, 2013 12:53 PM      Regarding: RE: appointment       The next week is fine            ----- Message -----         From: Maryan Puls, CMA         Sent: 02/17/2013  11:04 AM           To: Axel Filler, MD      Subject: appointment                                              The only day that you're in the office is Tuesday and your schedule is full.  Do you want me to overbook or schedule it for the following week?            [name]      ----- Message -----         From: Axel Filler, MD         Sent: 02/17/2013   7:58 AM           To: Maryan Puls, CMA            Can you set her up to come in some time next week, and not as a last pt of the day.            Thanks      AR      ----- Message -----         From: West Bali, MD         Sent: 02/16/2013   1:30 PM           To: Axel Filler, MD            IN OCT 2014 we checked [name] she had no significant ascites. She was scanned today as well and she has no fluid collection large enough to tap or drain. She is on Cipro for a UTI for 3 days. I have nothing left to offer her regarding her abdominal pain which seems positional? Her pain is getting worse she tells me, but maybe if she could see you in the office you all may discuss it and see if you can come up with any other reason for her abdominal pain.            ----- Message -----         From: Axel Filler, MD         Sent: 02/15/2013   6:14 PM           To: West Bali, MD            I got your message about the conversations you've had with [name].              Last time I saw her her hernia was fully reducible.  Her abdominal pain could be from a brewing peritonitis from her ascites, I'm not sure.  It could obviously also be from her hernia.            I hesitate to fix her hernia with her on going paracentesis  as  this may lead to an ascitic leak.  If forced I can fix it, but I think her having constant leaking ascites would cause more issues from her, and cause an entryway for bacteria to cause ascitic peritonitis.                    Let me know if there's anything else I can do.            Deanna Thompson                         ------

## 2013-02-28 ENCOUNTER — Ambulatory Visit (INDEPENDENT_AMBULATORY_CARE_PROVIDER_SITE_OTHER): Payer: BC Managed Care – PPO | Admitting: General Surgery

## 2013-02-28 ENCOUNTER — Encounter (INDEPENDENT_AMBULATORY_CARE_PROVIDER_SITE_OTHER): Payer: Self-pay | Admitting: General Surgery

## 2013-02-28 VITALS — BP 128/68 | HR 87 | Temp 98.0°F | Resp 18 | Ht 67.0 in | Wt 191.0 lb

## 2013-02-28 DIAGNOSIS — K429 Umbilical hernia without obstruction or gangrene: Secondary | ICD-10-CM

## 2013-02-28 NOTE — Progress Notes (Signed)
Subjective:     Patient ID: Deanna Thompson, female   DOB: [birth year], 57 y.o.   MRN: [id number]  HPI The patient is a 57 year old female well-known to me with an umbilical hernia and cirrhosis with some previous multiple paracentesis. The patient has been seen recently by Dr. Darrick Penna and evaluated for abdominal cramping. Patient underwent CT scan which was essentially negative. The patient states that the cramping has caused her to double over. The pain slowly resolved on its own.  Upon reviewing the CT scan there is some fluid-filled umbilical hernia however it cannot be completely seen on the CT scan. She does have approximately a liter of fluid within her abdomen The paracolic gutters and around the liver, and spleen.  Review of Systems  Constitutional: Negative.   HENT: Negative.   Respiratory: Negative.   Cardiovascular: Negative.   Gastrointestinal: Positive for abdominal pain.  Neurological: Negative.   All other systems reviewed and are negative.       Objective:   Physical Exam  Constitutional: She is oriented to person, place, and time. She appears well-developed and well-nourished.  HENT:  Head: Normocephalic and atraumatic.  Eyes: Conjunctivae and EOM are normal. Pupils are equal, round, and reactive to light.  Neck: Normal range of motion. Neck supple.  Cardiovascular: Normal rate, regular rhythm and normal heart sounds.   Pulmonary/Chest: Effort normal and breath sounds normal.  Abdominal: Soft. Bowel sounds are normal. She exhibits no distension. There is no rebound and no guarding.    Musculoskeletal: Normal range of motion.  Neurological: She is alert and oriented to person, place, and time.  Skin: Skin is warm and dry.       Assessment:     This [age]-year-old female with umbilical hernia, complicated with cirrhosis and ascites.     Plan:     1. I will discuss with Dr. Darrick Penna the possibility of optimizing the patient to potentially fix her hernia. It is  difficult to assess whether or not this hernia is causing her abdominal cramping at this time since there does not seem to be any omentum or any bowel within the hernia. 2. Protocol the patient back after discussing her situation with Dr. Darrick Penna.

## 2013-03-31 ENCOUNTER — Telehealth: Payer: Self-pay | Admitting: *Deleted

## 2013-03-31 NOTE — Telephone Encounter (Signed)
REVIEWED.  

## 2013-03-31 NOTE — Telephone Encounter (Signed)
Pt went to Dr. Ouida Sills and he put pt otezla. Pt wanted Dr. Oneida Alar and Tamela Oddi to be aware of it, pt states Dr. Ouida Sills is going to get blood work on pt so see if the otezla if affecting her liver. Please advise if any questions [phone]

## 2013-04-03 NOTE — Telephone Encounter (Signed)
Pt's husband aware that Dr. Oneida Alar did not have any objections to the medication.

## 2013-04-11 ENCOUNTER — Other Ambulatory Visit: Payer: Self-pay | Admitting: Obstetrics & Gynecology

## 2013-04-11 DIAGNOSIS — Z139 Encounter for screening, unspecified: Secondary | ICD-10-CM

## 2013-04-13 ENCOUNTER — Ambulatory Visit (HOSPITAL_COMMUNITY)
Admission: RE | Admit: 2013-04-13 | Discharge: 2013-04-13 | Disposition: A | Discharge status: Home / Self Care | Payer: BC Managed Care – PPO | Source: Ambulatory Visit | Attending: Obstetrics & Gynecology | Admitting: Obstetrics & Gynecology

## 2013-04-13 DIAGNOSIS — Z139 Encounter for screening, unspecified: Secondary | ICD-10-CM

## 2013-04-13 DIAGNOSIS — Z1231 Encounter for screening mammogram for malignant neoplasm of breast: Secondary | ICD-10-CM | POA: Insufficient documentation

## 2013-05-03 ENCOUNTER — Encounter: Payer: Self-pay | Admitting: Gastroenterology

## 2013-05-03 ENCOUNTER — Telehealth: Payer: Self-pay

## 2013-05-03 NOTE — Telephone Encounter (Signed)
Pt aware.

## 2013-05-03 NOTE — Telephone Encounter (Signed)
Pt called for a report on her visit to see Dr.Anderson at [facility] yesterday. She said that he said that everything is looking good. She is still on the [place]. It did cause more cramps, but she has adjusted. He called the cramping tenemus. She just wanted to [facility] Dr. Oneida Alar and find out when her next appt should be. Looks like she is on recall for Feb and I told her they will be calling her to schedule that appt,

## 2013-05-03 NOTE — Telephone Encounter (Signed)
REVIEWED. AGREE. PLEASE CALL PT. I'LL SEE HER LATER IN THE MO.

## 2013-05-03 NOTE — Telephone Encounter (Signed)
Pt is aware of OV on 3/4 at 2pm with SF and appt card was mailed

## 2013-05-15 ENCOUNTER — Telehealth: Payer: Self-pay

## 2013-05-15 NOTE — Telephone Encounter (Signed)
INITIATE REFERRAL TO [facility] GI DEPT FOR CHRONIC, SEVERE INTERMITTENT ABD PAIN-ETIOLOGY UNKNOWN.

## 2013-05-15 NOTE — Telephone Encounter (Signed)
PLEASE CALL PT. She should go to the ED for evaluation the next time she has severe abd pain so we can obtain imaging when she is in the midst of an episode. If it's episodic abdominal pain, I have nothing left to offer her except a referral to [facility] for a second opinion.

## 2013-05-15 NOTE — Telephone Encounter (Signed)
Called and informed Kris Mouton.

## 2013-05-15 NOTE — Telephone Encounter (Signed)
Pt called and said she almost went to the hospital over the week-end. Had a terrible time with her stomach. Stomach cramps in her umbilical area.  Some vomiting, about 5 times on Sat. She said she did not have the flu. No fever. Did have a few chills. Having BM's every day or at least every other day, not large ones.   She said the abdominal cramps were so severe, she cried and she could hardly stand it.   She just does not know what could be causing her stomach to cramp like that.  ( Spasms).   Please advise! ( we will need to call her home number, her mobile phone is messed up. Ok to leave VM on home phone if she is not there. )

## 2013-05-17 NOTE — Telephone Encounter (Signed)
Patient is seeing Dr. Amalia Hailey at [facility] on April 10th at 11:15 and she is aware

## 2013-05-21 ENCOUNTER — Emergency Department (HOSPITAL_COMMUNITY)
Admission: EM | Admit: 2013-05-21 | Discharge: 2013-05-21 | Disposition: A | Discharge status: Home / Self Care | Payer: BC Managed Care – PPO | Attending: Emergency Medicine | Admitting: Emergency Medicine

## 2013-05-21 ENCOUNTER — Encounter (HOSPITAL_COMMUNITY): Payer: Self-pay | Admitting: Emergency Medicine

## 2013-05-21 ENCOUNTER — Emergency Department (HOSPITAL_COMMUNITY): Payer: BC Managed Care – PPO

## 2013-05-21 DIAGNOSIS — K589 Irritable bowel syndrome without diarrhea: Secondary | ICD-10-CM | POA: Insufficient documentation

## 2013-05-21 DIAGNOSIS — I10 Essential (primary) hypertension: Secondary | ICD-10-CM | POA: Insufficient documentation

## 2013-05-21 DIAGNOSIS — K219 Gastro-esophageal reflux disease without esophagitis: Secondary | ICD-10-CM | POA: Insufficient documentation

## 2013-05-21 DIAGNOSIS — Z79899 Other long term (current) drug therapy: Secondary | ICD-10-CM | POA: Insufficient documentation

## 2013-05-21 DIAGNOSIS — Z8739 Personal history of other diseases of the musculoskeletal system and connective tissue: Secondary | ICD-10-CM | POA: Insufficient documentation

## 2013-05-21 DIAGNOSIS — Z792 Long term (current) use of antibiotics: Secondary | ICD-10-CM | POA: Insufficient documentation

## 2013-05-21 DIAGNOSIS — F410 Panic disorder [episodic paroxysmal anxiety] without agoraphobia: Secondary | ICD-10-CM | POA: Insufficient documentation

## 2013-05-21 DIAGNOSIS — R109 Unspecified abdominal pain: Secondary | ICD-10-CM

## 2013-05-21 DIAGNOSIS — Z87891 Personal history of nicotine dependence: Secondary | ICD-10-CM | POA: Insufficient documentation

## 2013-05-21 DIAGNOSIS — Z9889 Other specified postprocedural states: Secondary | ICD-10-CM | POA: Insufficient documentation

## 2013-05-21 DIAGNOSIS — Z872 Personal history of diseases of the skin and subcutaneous tissue: Secondary | ICD-10-CM | POA: Insufficient documentation

## 2013-05-21 DIAGNOSIS — K429 Umbilical hernia without obstruction or gangrene: Secondary | ICD-10-CM

## 2013-05-21 DIAGNOSIS — K449 Diaphragmatic hernia without obstruction or gangrene: Secondary | ICD-10-CM | POA: Insufficient documentation

## 2013-05-21 HISTORY — DX: Unspecified abdominal hernia without obstruction or gangrene: K46.9

## 2013-05-21 LAB — COMPREHENSIVE METABOLIC PANEL
ALK PHOS: 140 U/L — AB (ref 39–117)
ALT: 18 U/L (ref 0–35)
AST: 23 U/L (ref 0–37)
Albumin: 3.5 g/dL (ref 3.5–5.2)
BUN: 15 mg/dL (ref 6–23)
CALCIUM: 10.6 mg/dL — AB (ref 8.4–10.5)
CO2: 29 meq/L (ref 19–32)
Chloride: 96 mEq/L (ref 96–112)
Creatinine, Ser: 0.9 mg/dL (ref 0.50–1.10)
GFR calc Af Amer: 81 mL/min — ABNORMAL LOW (ref >90)
GFR, EST NON AFRICAN AMERICAN: 70 mL/min — AB (ref >90)
GLUCOSE: 143 mg/dL — AB (ref 70–99)
Potassium: 3.9 mEq/L (ref 3.7–5.3)
Sodium: 137 mEq/L (ref 137–147)
Total Bilirubin: 0.4 mg/dL (ref 0.3–1.2)
Total Protein: 7.8 g/dL (ref 6.0–8.3)

## 2013-05-21 LAB — URINALYSIS, ROUTINE W REFLEX MICROSCOPIC
BILIRUBIN URINE: NEGATIVE
Glucose, UA: NEGATIVE mg/dL
KETONES UR: NEGATIVE mg/dL
Nitrite: NEGATIVE
Protein, ur: NEGATIVE mg/dL
SPECIFIC GRAVITY, URINE: 1.015 (ref 1.005–1.030)
UROBILINOGEN UA: 0.2 mg/dL (ref 0.0–1.0)
pH: 6 (ref 5.0–8.0)

## 2013-05-21 LAB — CBC WITH DIFFERENTIAL/PLATELET
BASOS ABS: 0 10*3/uL (ref 0.0–0.1)
Basophils Relative: 1 % (ref 0–1)
EOS PCT: 2 % (ref 0–5)
Eosinophils Absolute: 0.1 10*3/uL (ref 0.0–0.7)
HCT: 35.1 % — ABNORMAL LOW (ref 36.0–46.0)
Hemoglobin: 11.9 g/dL — ABNORMAL LOW (ref 12.0–15.0)
LYMPHS ABS: 1.2 10*3/uL (ref 0.7–4.0)
Lymphocytes Relative: 15 % (ref 12–46)
MCH: 29 pg (ref 26.0–34.0)
MCHC: 33.9 g/dL (ref 30.0–36.0)
MCV: 85.4 fL (ref 78.0–100.0)
Monocytes Absolute: 0.6 10*3/uL (ref 0.1–1.0)
Monocytes Relative: 8 % (ref 3–12)
NEUTROS ABS: 5.7 10*3/uL (ref 1.7–7.7)
Neutrophils Relative %: 74 % (ref 43–77)
PLATELETS: 248 10*3/uL (ref 150–400)
RBC: 4.11 MIL/uL (ref 3.87–5.11)
RDW: 14 % (ref 11.5–15.5)
WBC: 7.6 10*3/uL (ref 4.0–10.5)

## 2013-05-21 LAB — URINE MICROSCOPIC-ADD ON

## 2013-05-21 LAB — LIPASE, BLOOD: Lipase: 73 U/L — ABNORMAL HIGH (ref 11–59)

## 2013-05-21 MED ORDER — ONDANSETRON HCL 4 MG/2ML IJ SOLN
4.0000 mg | Freq: Once | INTRAMUSCULAR | Status: AC
  Administered 2013-05-21: 4 mg via INTRAVENOUS
  Filled 2013-05-21: qty 2

## 2013-05-21 MED ORDER — MORPHINE SULFATE 4 MG/ML IJ SOLN
4.0000 mg | Freq: Once | INTRAMUSCULAR | Status: AC
  Administered 2013-05-21: 4 mg via INTRAVENOUS
  Filled 2013-05-21: qty 1

## 2013-05-21 MED ORDER — IOHEXOL 300 MG/ML  SOLN
100.0000 mL | Freq: Once | INTRAMUSCULAR | Status: AC | PRN
  Administered 2013-05-21: 100 mL via INTRAVENOUS

## 2013-05-21 MED ORDER — IOHEXOL 300 MG/ML  SOLN
50.0000 mL | Freq: Once | INTRAMUSCULAR | Status: AC | PRN
  Administered 2013-05-21: 50 mL via ORAL

## 2013-05-21 MED ORDER — SODIUM CHLORIDE 0.9 % IV SOLN
Freq: Once | INTRAVENOUS | Status: AC
Start: 2013-05-21 — End: 2013-05-21
  Administered 2013-05-21: 12:00:00 via INTRAVENOUS

## 2013-05-21 MED ORDER — HYDROMORPHONE HCL PF 1 MG/ML IJ SOLN
1.0000 mg | Freq: Once | INTRAMUSCULAR | Status: AC
  Administered 2013-05-21: 1 mg via INTRAVENOUS
  Filled 2013-05-21: qty 1

## 2013-05-21 NOTE — ED Provider Notes (Signed)
CSN: [id number]     Arrival date & time 05/21/13  1043 History   This chart was scribed for Deanna Parkinson, PA-C by Lovena Le Day, ED scribe. This patient was seen in room APA09/APA09 and the patient's care was started at 1043.  Chief Complaint  Patient presents with  . Abdominal Pain   The history is provided by the patient. No language interpreter was used.   HPI Comments: Deanna Thompson is a 58 y.o. female who presents to the Emergency Department complaining of intermittent, moderate to severe LLQ and umbilical abdominal pain, ongoing for over x1 year but with increased/intermittent pain since 6 hours ago. She states that her abdominal pain is associated w/a reducible umbilical hernia. She has seen a surgeon for this problem and had multiple previous w/u of CT scans w/Dr. Oneida Alar which had demonstrated no evidence for her abdominal pain. She reports that passing gas or having a BM seem to improve her abdominal pain. She states umbilical and LLQ abdominal pain when "my umbilical hernia gets hard and flares up". She reports multiple episodes of this problem in which the past 5 episodes of her hernia flaring up have been with pain in the same location over her abdomen. She reports x2 associated emesis episodes, last episode x1 week ago. No emesis episodes this AM. She is nauseated. Chills when pain occurs. She has never had a CT scan performed while she was having abdominal pain and was advised by Dr. Oneida Alar to come to ED when pain occurs for further evaluation. She denies pelvic issues.   She has an appointment with Dr. Amalia Hailey at [place] April 10th - referral was arranged by Dr. Oneida Alar.   Past Medical History  Diagnosis Date  . Brain aneurysm     METAL COIL PREVENTS MRI IMAGING  . IBS (irritable bowel syndrome)   . GERD (gastroesophageal reflux disease)   . Panic attack   . Anxiety   . Psoriatic arthritis   . ETOH abuse     Quit in 07/2011   . Sjogren's disease   . Cirrhosis      Presumably alcoholic cirrhosis  . Complication of anesthesia   . PONV (postoperative nausea and vomiting)   . HTN (hypertension)     no longer medicated, dated on 04/25/12  . Abdominal hernia    Past Surgical History  Procedure Laterality Date  . Cerebral aneurysm repair    . Esophageal biopsy  10/13/2011    SLF: mild gastritis/duodenal polypoid lesion   . Colonoscopy  10/13/2011    [id number] hemorrhoids/varices rectal/lesion in ascending colon(HYPERPLASTIC POLYP)  . Paracentesis  Feb 2014    10.7 liters  . Colonoscopy N/A 01/17/2013    Procedure: COLONOSCOPY;  Surgeon: Danie Binder, MD;  Location: [facility];  Service: Endoscopy;  Laterality: N/A;  9:30-moved to 12:30 Darius Bump to notify pt   Family History  Problem Relation Age of Onset  . Cirrhosis Mother     nash  . Colon cancer Neg Hx   . Inflammatory bowel disease Neg Hx   . Asthma    . Heart disease    . COPD Father   . Heart disease Father    History  Substance Use Topics  . Smoking status: Former Smoker -- 2.00 packs/day for 12 years    Types: Cigarettes    Quit date: 03/31/1995  . Smokeless tobacco: Not on file     Comment: quit in 1997  . Alcohol Use: No  Comment: Bottle of wine a day for years, but quit in 07/2011   OB History   Grav Para Term Preterm Abortions TAB SAB Ect Mult Living                 Review of Systems  Constitutional: Positive for chills. Negative for fever, activity change and appetite change.  Respiratory: Negative for cough, chest tightness and shortness of breath.   Cardiovascular: Negative for chest pain.  Gastrointestinal: Positive for nausea and abdominal pain. Negative for vomiting, diarrhea, constipation and abdominal distention.  Genitourinary: Negative for dysuria, urgency, flank pain, vaginal bleeding, vaginal discharge and vaginal pain.  Musculoskeletal: Negative for back pain.  Skin: Negative for rash.  Neurological: Negative for headaches.  Hematological:  Negative for adenopathy.  All other systems reviewed and are negative.   Allergies  Omeprazole; Other; Phenytoin; Keflex; and Phenergan  Home Medications   Current Outpatient Rx  Name  Route  Sig  Dispense  Refill  . bumetanide (BUMEX) 1 MG tablet   Oral   Take 1 mg by mouth 3 (three) times daily.         . Cholecalciferol (VITAMIN D-3) 5000 UNITS TABS   Oral   Take 5,000 mg by mouth daily.         . ciprofloxacin (CIPRO) 500 MG tablet      1 po bid for 3 days   6 tablet   0   . escitalopram (LEXAPRO) 20 MG tablet   Oral   Take 20 mg by mouth daily.         Marland Kitchen esomeprazole (NEXIUM) 40 MG capsule   Oral   Take 40 mg by mouth daily before breakfast.         . folic acid (FOLVITE) 1 MG tablet   Oral   Take 1 mg by mouth daily.         Marland Kitchen loratadine (CLARITIN) 10 MG tablet   Oral   Take 10 mg by mouth daily.         Marland Kitchen NASONEX 50 MCG/ACT nasal spray   Nasal   Place 2 sprays into the nose daily as needed (Allergies).          . rifaximin (XIFAXAN) 550 MG TABS tablet   Oral   Take 550 mg by mouth 2 (two) times daily.         . Simethicone (GAS-X EXTRA STRENGTH) 125 MG CAPS   Oral   Take 1 capsule by mouth daily.         . sodium chloride (OCEAN) 0.65 % nasal spray   Nasal   Place 1 spray into the nose as needed for congestion.         Marland Kitchen spironolactone (ALDACTONE) 100 MG tablet   Oral   Take 100 mg by mouth 3 (three) times daily.         . Tetrahydrozoline HCl (VISINE OP)   Ophthalmic   Apply 2 drops to eye 2 (two) times daily.          Triage Vitals: BP 139/77  Pulse 77  Temp(Src) 98.2 F (36.8 C) (Oral)  Resp 20  Ht 5\' 7"  (1.702 m)  Wt 201 lb (91.173 kg)  BMI 31.47 kg/m2  SpO2 100%  Physical Exam  Nursing note and vitals reviewed. Constitutional: She is oriented to person, place, and time. She appears well-developed and well-nourished. No distress.  HENT:  Head: Normocephalic and atraumatic.  Mouth/Throat: Oropharynx is  clear and moist.  Eyes: Conjunctivae are normal. Right eye exhibits no discharge. Left eye exhibits no discharge.  Neck: Normal range of motion.  Cardiovascular: Normal rate, regular rhythm, normal heart sounds and intact distal pulses.   No murmur heard. Pulmonary/Chest: Effort normal and breath sounds normal. No respiratory distress. She exhibits no tenderness.  Abdominal: Soft. Bowel sounds are normal. She exhibits no distension. There is tenderness. There is no rebound and no guarding.  She has small palpable, umbilical hernia present with mild bluish discoloration which patient reports is chronic. Mid left abdominal tenderness.  Unable to fully evaluate the hernia due to patient's level of pain and uncooperative for exam.    Musculoskeletal: Normal range of motion. She exhibits no edema.  Lymphadenopathy:    She has no cervical adenopathy.  Neurological: She is alert and oriented to person, place, and time. She exhibits normal muscle tone. Coordination normal.  Skin: Skin is warm and dry.  Psychiatric: She has a normal mood and affect. Thought content normal.    ED Course  Procedures (including critical care time) DIAGNOSTIC STUDIES: Oxygen Saturation is 100% on room air, normal by my interpretation.    COORDINATION OF CARE: At 1140 AM Discussed treatment plan with patient which includes blood work, UA, CT abd/pelvis. Patient agrees.   Labs Review Labs Reviewed  CBC WITH DIFFERENTIAL - Abnormal; Notable for the following:    Hemoglobin 11.9 (*)    HCT 35.1 (*)    All other components within normal limits  COMPREHENSIVE METABOLIC PANEL - Abnormal; Notable for the following:    Glucose, Bld 143 (*)    Calcium 10.6 (*)    Alkaline Phosphatase 140 (*)    GFR calc non Af Amer 70 (*)    GFR calc Af Amer 81 (*)    All other components within normal limits  URINALYSIS, ROUTINE W REFLEX MICROSCOPIC - Abnormal; Notable for the following:    Hgb urine dipstick SMALL (*)     Leukocytes, UA TRACE (*)    All other components within normal limits  LIPASE, BLOOD - Abnormal; Notable for the following:    Lipase 73 (*)    All other components within normal limits  URINE MICROSCOPIC-ADD ON   Imaging Review Ct Abdomen Pelvis W Contrast  05/21/2013   CLINICAL DATA:  Abdominal pain  EXAM: CT ABDOMEN AND PELVIS WITH CONTRAST  TECHNIQUE: Multidetector CT imaging of the abdomen and pelvis was performed using the standard protocol following bolus administration of intravenous contrast.  CONTRAST:  50mL OMNIPAQUE IOHEXOL 300 MG/ML SOLN, OMNIPAQUE IOHEXOL 300 MG/ML SOLN  COMPARISON:  CT ABDOMEN WO/W CM dated 01/02/2013; CT ABD - PELV W/ CM dated 12/16/2012  FINDINGS: Stable gallstones.  Cirrhotic liver. Stable low-density lesion in the lateral segment of the left lobe of the liver on image 30.  Spleen, pancreas, adrenal glands are within normal limits. Kidneys are lobulated. Small calculus in the lower pole of the left kidney has developed measuring 5 mm.  Small amount of ascites about the liver and right pericolic gutter. This is improved.  Umbilical hernia contains fluid as well as a segment of small bowel. Mildly distended small bowel loops are seen extending into the defect.  No free intraperitoneal gas.  Free fluid is seen layering in the pelvis right inguinal fluid collection is stable.  Bladder, uterus, and adnexa are within normal limits.  IMPRESSION: Stable cirrhotic liver.  Improved ascites.  Ventral hernia contains a small amount of small bowel with dilated bowel loops suggesting  early or partial small bowel obstruction.  Cholelithiasis.   Electronically Signed   By: Maryclare Bean M.D.   On: 05/21/2013 13:16    EKG Interpretation   None      MDM   Final diagnoses:  Umbilical hernia  Abdominal pain   Patient is non-toxic appearing.  VSS.  No vomiting or abdominal distention, BS present.  Has hx of chronic abdominal pain and umbilical hernia that patient reports as area  of tenderness.  Will orders labs and CT abd/pelvis  CT and labs results reviewed and discussed with EDP, Dr. Regenia Skeeter who has also evaluated the patient.  Dr. Regenia Skeeter to consult Dr. Arnoldo Morale regarding further care of the hernia.    [place] Dr. Arnoldo Morale evaluated patient and successfully reduced hernia.  Advised pt stable for discharge and recommended office f/u   Patient feeling better and stable for discharge.    I personally performed the services described in this documentation, which was scribed in my presence. The recorded information has been reviewed and is accurate.      Geral Tuch L. Vanessa Elsmere, PA-C 05/22/13 2218

## 2013-05-21 NOTE — Discharge Instructions (Signed)
Abdominal Pain, Adult °Many things can cause abdominal pain. Usually, abdominal pain is not caused by a disease and will improve without treatment. It can often be observed and treated at home. Your health care provider will do a physical exam and possibly order blood tests and X-rays to help determine the seriousness of your pain. However, in many cases, more time must pass before a clear cause of the pain can be found. Before that point, your health care provider may not know if you need more testing or further treatment. °HOME CARE INSTRUCTIONS  °Monitor your abdominal pain for any changes. The following actions may help to alleviate any discomfort you are experiencing: °· Only take over-the-counter or prescription medicines as directed by your health care provider. °· Do not take laxatives unless directed to do so by your health care provider. °· Try a clear liquid diet (broth, tea, or water) as directed by your health care provider. Slowly move to a bland diet as tolerated. °SEEK MEDICAL CARE IF: °· You have unexplained abdominal pain. °· You have abdominal pain associated with nausea or diarrhea. °· You have pain when you urinate or have a bowel movement. °· You experience abdominal pain that wakes you in the night. °· You have abdominal pain that is worsened or improved by eating food. °· You have abdominal pain that is worsened with eating fatty foods. °SEEK IMMEDIATE MEDICAL CARE IF:  °· Your pain does not go away within 2 hours. °· You have a fever. °· You keep throwing up (vomiting). °· Your pain is felt only in portions of the abdomen, such as the right side or the left lower portion of the abdomen. °· You pass bloody or black tarry stools. °MAKE SURE YOU: °· Understand these instructions.   °· Will watch your condition.   °· Will get help right away if you are not doing well or get worse.   °Document Released: 12/24/2004 Document Revised: 01/04/2013 Document Reviewed: 11/23/2012 °Exit[employer]® Patient  Information ©2014 [employer]. ° °

## 2013-05-21 NOTE — Consult Note (Signed)
Reason for Consult: Incarcerated umbilical hernia Referring Physician: ER  Deanna Thompson is an 58 y.o. female.  HPI: Patient is a 58 year old white female with a history of cirrhosis and ascites who is also had a long-standing history of an umbilical hernia as well as right inguinal hernia. She presented today with worsening umbilical pain and a mass which would not reduce its own. She states that it does take out, but was never hurting. She was last seen by Deanna Thompson surgical in December 2014 and at that time, elected surgery was postponed due to to her ascites. She states her ascites has improved and she has not needed a paracentesis for the past few months. He does have nausea, but no vomiting has been noted. She states she has no right groin pain.  Past Medical History  Diagnosis Date  . Brain aneurysm     METAL COIL PREVENTS MRI IMAGING  . IBS (irritable bowel syndrome)   . GERD (gastroesophageal reflux disease)   . Panic attack   . Anxiety   . Psoriatic arthritis   . ETOH abuse     Quit in 07/2011   . Sjogren's disease   . Cirrhosis     Presumably alcoholic cirrhosis  . Complication of anesthesia   . PONV (postoperative nausea and vomiting)   . HTN (hypertension)     no longer medicated, dated on 04/25/12  . Abdominal hernia     Past Surgical History  Procedure Laterality Date  . Cerebral aneurysm repair    . Esophageal biopsy  10/13/2011    SLF: mild gastritis/duodenal polypoid lesion   . Colonoscopy  10/13/2011    [id number] hemorrhoids/varices rectal/lesion in ascending colon(HYPERPLASTIC POLYP)  . Paracentesis  Feb 2014    10.7 liters  . Colonoscopy N/A 01/17/2013    Procedure: COLONOSCOPY;  Surgeon: Danie Binder, MD;  Location: [facility];  Service: Endoscopy;  Laterality: N/A;  9:30-moved to 12:30 Darius Bump to notify pt    Family History  Problem Relation Age of Onset  . Cirrhosis Mother     nash  . Colon cancer Neg Hx   . Inflammatory bowel  disease Neg Hx   . Asthma    . Heart disease    . COPD Father   . Heart disease Father     Social History:  reports that she quit smoking about 18 years ago. Her smoking use included Cigarettes. She has a 24 pack-year smoking history. She does not have any smokeless tobacco history on file. She reports that she does not drink alcohol or use illicit drugs.  Allergies:  Allergies  Allergen Reactions  . Omeprazole Shortness Of Breath  . Other Shortness Of Breath and Rash    Red Meat Causes shortness of breath, rash and flu like symptoms.   . Phenytoin Rash  . Keflex [Cephalexin] Other (See Comments)    Severe yeast infection  . Phenergan [Promethazine Hcl] Other (See Comments)    Not in right state of mind     Medications: I have reviewed the patient's current medications.  Results for orders placed during the hospital encounter of 05/21/13 (from the past 48 hour(s))  URINALYSIS, ROUTINE W REFLEX MICROSCOPIC     Status: Abnormal   Collection Time    05/21/13 11:30 AM      Result Value Ref Range   Color, Urine YELLOW  YELLOW   APPearance CLEAR  CLEAR   Specific Gravity, Urine 1.015  1.005 - 1.030  pH 6.0  5.0 - 8.0   Glucose, UA NEGATIVE  NEGATIVE mg/dL   Hgb urine dipstick SMALL (*) NEGATIVE   Bilirubin Urine NEGATIVE  NEGATIVE   Ketones, ur NEGATIVE  NEGATIVE mg/dL   Protein, ur NEGATIVE  NEGATIVE mg/dL   Urobilinogen, UA 0.2  0.0 - 1.0 mg/dL   Nitrite NEGATIVE  NEGATIVE   Leukocytes, UA TRACE (*) NEGATIVE  URINE MICROSCOPIC-ADD ON     Status: None   Collection Time    05/21/13 11:30 AM      Result Value Ref Range   WBC, UA 0-2  <3 WBC/hpf   RBC / HPF 0-2  <3 RBC/hpf  CBC WITH DIFFERENTIAL     Status: Abnormal   Collection Time    05/21/13 12:01 PM      Result Value Ref Range   WBC 7.6  4.0 - 10.5 K/uL   RBC 4.11  3.87 - 5.11 MIL/uL   Hemoglobin 11.9 (*) 12.0 - 15.0 g/dL   HCT 35.1 (*) 36.0 - 46.0 %   MCV 85.4  78.0 - 100.0 fL   MCH 29.0  26.0 - 34.0 pg   MCHC  33.9  30.0 - 36.0 g/dL   RDW 14.0  11.5 - 15.5 %   Platelets 248  150 - 400 K/uL   Neutrophils Relative % 74  43 - 77 %   Neutro Abs 5.7  1.7 - 7.7 K/uL   Lymphocytes Relative 15  12 - 46 %   Lymphs Abs 1.2  0.7 - 4.0 K/uL   Monocytes Relative 8  3 - 12 %   Monocytes Absolute 0.6  0.1 - 1.0 K/uL   Eosinophils Relative 2  0 - 5 %   Eosinophils Absolute 0.1  0.0 - 0.7 K/uL   Basophils Relative 1  0 - 1 %   Basophils Absolute 0.0  0.0 - 0.1 K/uL  COMPREHENSIVE METABOLIC PANEL     Status: Abnormal   Collection Time    05/21/13 12:01 PM      Result Value Ref Range   Sodium 137  137 - 147 mEq/L   Potassium 3.9  3.7 - 5.3 mEq/L   Chloride 96  96 - 112 mEq/L   CO2 29  19 - 32 mEq/L   Glucose, Bld 143 (*) 70 - 99 mg/dL   BUN 15  6 - 23 mg/dL   Creatinine, Ser 0.90  0.50 - 1.10 mg/dL   Calcium 10.6 (*) 8.4 - 10.5 mg/dL   Total Protein 7.8  6.0 - 8.3 g/dL   Albumin 3.5  3.5 - 5.2 g/dL   AST 23  0 - 37 U/L   ALT 18  0 - 35 U/L   Alkaline Phosphatase 140 (*) 39 - 117 U/L   Total Bilirubin 0.4  0.3 - 1.2 mg/dL   GFR calc non Af Amer 70 (*) >90 mL/min   GFR calc Af Amer 81 (*) >90 mL/min   Comment: (NOTE)     The eGFR has been calculated using the CKD EPI equation.     This calculation has not been validated in all clinical situations.     eGFR's persistently <90 mL/min signify possible Chronic Kidney     Disease.  LIPASE, BLOOD     Status: Abnormal   Collection Time    05/21/13 12:01 PM      Result Value Ref Range   Lipase 73 (*) 11 - 59 U/L    Ct Abdomen Pelvis W  Contrast  05/21/2013   CLINICAL DATA:  Abdominal pain  EXAM: CT ABDOMEN AND PELVIS WITH CONTRAST  TECHNIQUE: Multidetector CT imaging of the abdomen and pelvis was performed using the standard protocol following bolus administration of intravenous contrast.  CONTRAST:  50mL OMNIPAQUE IOHEXOL 300 MG/ML SOLN, 158mL OMNIPAQUE IOHEXOL 300 MG/ML SOLN  COMPARISON:  CT ABDOMEN WO/W CM dated 01/02/2013; CT ABD - PELV W/ CM dated  12/16/2012  FINDINGS: Stable gallstones.  Cirrhotic liver. Stable low-density lesion in the lateral segment of the left lobe of the liver on image 30.  Spleen, pancreas, adrenal glands are within normal limits. Kidneys are lobulated. Small calculus in the lower pole of the left kidney has developed measuring 5 mm.  Small amount of ascites about the liver and right pericolic gutter. This is improved.  Umbilical hernia contains fluid as well as a segment of small bowel. Mildly distended small bowel loops are seen extending into the defect.  No free intraperitoneal gas.  Free fluid is seen layering in the pelvis right inguinal fluid collection is stable.  Bladder, uterus, and adnexa are within normal limits.  IMPRESSION: Stable cirrhotic liver.  Improved ascites.  Ventral hernia contains a small amount of small bowel with dilated bowel loops suggesting early or partial small bowel obstruction.  Cholelithiasis.   Electronically Signed   By: Maryclare Bean M.D.   On: 05/21/2013 13:16    ROS: See chart Blood pressure 125/62, pulse 66, temperature 98.2 F (36.8 C), temperature source Oral, resp. rate 20, height $RemoveBe'5\' 7"'IoCuITgFX$  (1.702 m), weight 91.173 kg (201 lb), SpO2 100.00%. Physical Exam: Pleasant white female in no acute distress. Abdomen is soft with an incarcerated umbilical hernia present. The skin is slightly discolored over it, but no drainage or erythema is noted. Patient states that she chronically has some thin skin over the umbilicus. The incarcerated umbilical hernia was reduced without difficulty. Her right inguinal hernia is easily reducible.  Assessment/Plan: Impression: Incarcerated umbilical hernia, resolved. Known right inguinal hernia, asymptomatic. History of ascites, resolving Plan: Patient has been given instructions on how to reduce the umbilical hernia should it recur. She was strongly encouraged to follow up with her surgeon in [place] for further management and treatment.  Alyna Stensland  A 05/21/2013, 2:48 PM

## 2013-05-21 NOTE — ED Notes (Signed)
Pt reports pain in llq and around umbilicus off and on for over 1 year.  Reports has had pain since 6am this morning.   Reports nausea, no vomiting or diarrhea.  LBM was yesterday or the day before.  Reports sees Dr Oneida Alar and was told to come have evaluation when stomach was actively hurting.  Reports has had colonoscopy and 2 ct scans within this past year but pt wasn't actively hurting at the time.

## 2013-05-22 ENCOUNTER — Other Ambulatory Visit: Payer: Self-pay | Admitting: Gastroenterology

## 2013-05-22 ENCOUNTER — Telehealth: Payer: Self-pay

## 2013-05-22 ENCOUNTER — Telehealth (INDEPENDENT_AMBULATORY_CARE_PROVIDER_SITE_OTHER): Payer: Self-pay

## 2013-05-22 DIAGNOSIS — K439 Ventral hernia without obstruction or gangrene: Secondary | ICD-10-CM

## 2013-05-22 NOTE — Telephone Encounter (Signed)
Pt called to say she has decided she would like to have a referral to Dr. Arnoldo Morale for the hernia.  Please advise!

## 2013-05-22 NOTE — Telephone Encounter (Signed)
Appointment has been canceled.

## 2013-05-22 NOTE — Telephone Encounter (Signed)
Patient states she was at [facility] 05-21-13 with Hernia/w bowel involvement appt 05-25-13@1050  with Dr. Rosendo Gros

## 2013-05-22 NOTE — Telephone Encounter (Signed)
PLEASE Call [facility]. Cancel OPV. SEEN IN ED FOR SEVERE ABD PAIN. CT SHOWED SB LOOP IN UMBILICAL HERNIA. INSTANTANEOUS RELIEF FROM HERNIA SAC BEING REDUCED BY DR, Arnoldo Morale IN ED. Pt has intermittent abd pain due to incarcerated loop of bowel in umbilical hernia. PT WILL DISCUSS WITH DR. Rosendo Gros.

## 2013-05-22 NOTE — Telephone Encounter (Signed)
INITIATE REFERRAL TO DR. [place].

## 2013-05-22 NOTE — Telephone Encounter (Signed)
Referral has been made to Dr. Jenkins 

## 2013-05-23 NOTE — Telephone Encounter (Signed)
Following up w/SLF before referral is completed

## 2013-05-23 NOTE — ED Provider Notes (Signed)
Medical screening examination/treatment/procedure(s) were conducted as a shared visit with non-physician practitioner(s) and myself.  I personally evaluated the patient during the encounter.  EKG Interpretation   None       Patient with umbilical hernia and now early SBO per CT scan. Has blue hue on skin over her hernia, thus surgery consulted. Dr. Arnoldo Morale reduced and states patient cleared for discharge and outpatient surgical f/u.  Ephraim Hamburger, MD 05/23/13 [phone]

## 2013-05-23 NOTE — Telephone Encounter (Signed)
Patient called and wanted to speak with you concerning her referral to Dr. Arnoldo Morale.  I told her the referral has been made.  She asked that Darius Bump gives her a call once she receives an appt.  Routing to [employer]

## 2013-05-25 ENCOUNTER — Encounter (INDEPENDENT_AMBULATORY_CARE_PROVIDER_SITE_OTHER): Payer: BC Managed Care – PPO | Admitting: General Surgery

## 2013-05-31 ENCOUNTER — Ambulatory Visit: Payer: BC Managed Care – PPO | Admitting: Gastroenterology

## 2013-06-02 ENCOUNTER — Encounter (HOSPITAL_COMMUNITY): Payer: Self-pay | Admitting: Emergency Medicine

## 2013-06-02 ENCOUNTER — Emergency Department (HOSPITAL_COMMUNITY)
Admission: EM | Admit: 2013-06-02 | Discharge: 2013-06-02 | Disposition: A | Discharge status: Home / Self Care | Payer: BC Managed Care – PPO | Attending: Emergency Medicine | Admitting: Emergency Medicine

## 2013-06-02 DIAGNOSIS — K429 Umbilical hernia without obstruction or gangrene: Secondary | ICD-10-CM | POA: Insufficient documentation

## 2013-06-02 DIAGNOSIS — K219 Gastro-esophageal reflux disease without esophagitis: Secondary | ICD-10-CM | POA: Insufficient documentation

## 2013-06-02 DIAGNOSIS — F411 Generalized anxiety disorder: Secondary | ICD-10-CM | POA: Insufficient documentation

## 2013-06-02 DIAGNOSIS — I10 Essential (primary) hypertension: Secondary | ICD-10-CM | POA: Insufficient documentation

## 2013-06-02 DIAGNOSIS — Z87891 Personal history of nicotine dependence: Secondary | ICD-10-CM | POA: Insufficient documentation

## 2013-06-02 DIAGNOSIS — L4050 Arthropathic psoriasis, unspecified: Secondary | ICD-10-CM | POA: Insufficient documentation

## 2013-06-02 DIAGNOSIS — Z8739 Personal history of other diseases of the musculoskeletal system and connective tissue: Secondary | ICD-10-CM | POA: Insufficient documentation

## 2013-06-02 MED ORDER — ONDANSETRON HCL 4 MG/2ML IJ SOLN
4.0000 mg | Freq: Once | INTRAMUSCULAR | Status: AC
  Administered 2013-06-02: 4 mg via INTRAVENOUS
  Filled 2013-06-02: qty 2

## 2013-06-02 MED ORDER — HYDROMORPHONE HCL PF 1 MG/ML IJ SOLN
1.0000 mg | Freq: Once | INTRAMUSCULAR | Status: AC
  Administered 2013-06-02: 0.5 mg via INTRAVENOUS
  Administered 2013-06-02: 1 mg via INTRAVENOUS
  Filled 2013-06-02: qty 1

## 2013-06-02 MED ORDER — FENTANYL CITRATE 0.05 MG/ML IJ SOLN
50.0000 ug | Freq: Once | INTRAMUSCULAR | Status: AC
  Administered 2013-06-02: 50 ug via INTRAVENOUS
  Filled 2013-06-02: qty 2

## 2013-06-02 MED ORDER — HYDROMORPHONE HCL PF 1 MG/ML IJ SOLN
INTRAMUSCULAR | Status: AC
  Administered 2013-06-02: 0.5 mg via INTRAVENOUS
  Filled 2013-06-02: qty 1

## 2013-06-02 MED ORDER — SODIUM CHLORIDE 0.9 % IV BOLUS (SEPSIS)
500.0000 mL | Freq: Once | INTRAVENOUS | Status: AC
  Administered 2013-06-02: 500 mL via INTRAVENOUS

## 2013-06-02 MED ORDER — HYDROMORPHONE HCL PF 1 MG/ML IJ SOLN
1.0000 mg | Freq: Once | INTRAMUSCULAR | Status: AC
  Administered 2013-06-02: 1 mg via INTRAVENOUS

## 2013-06-02 NOTE — ED Notes (Signed)
Pt says she has an abd hernia, "incarcerated" nausea,  Pt was unable to "push it back in"  Seen here 2/22 for same

## 2013-06-02 NOTE — ED Notes (Signed)
Patient given discharge instruction, verbalized understand. IV removed, band aid applied. Patient ambulatory out of the department.  

## 2013-06-02 NOTE — ED Notes (Signed)
Pt to room, cursing (not upset at this time, just seems to be her normal vocabulary). Pt belching which she says relieves the discomfort. Pt states nausea, no actual vomiting.

## 2013-06-02 NOTE — ED Notes (Signed)
Assisted EDP with pt.  EDP gave verbal order for 0.5mg  Dilaudid.

## 2013-06-02 NOTE — ED Notes (Signed)
Pt denies pain, MD reduced Hernia, Pt did not need 0.5mg  of dilaudid that was held. Wasted with Mechele Claude RN

## 2013-06-02 NOTE — ED Provider Notes (Signed)
CSN: [id number]     Arrival date & time 06/02/13  1146 History   First MD Initiated Contact with Patient 06/02/13 1235    This chart was scribed for Nat Christen, MD by Terressa Koyanagi, ED Scribe. This patient was seen in room [facility] and the patient's care was started at 12:50 PM.  Chief Complaint  Patient presents with  . Abdominal Pain   The history is provided by the patient. No language interpreter was used.   HPI Comments: Deanna Thompson is a 58 y.o. female, with history of IBS, umbilical hernia, inguinal hernia, and HTN, who presents to the Emergency Department complaining of LLQ and umbilical abdominal pain. Pt states that her pain has been ongoing intermittently for several months. Pt reports, however, that her pain worsened 10 days ago and intensified 2 1/2 hours ago. Pt complains of associated nausea but denies vomiting. Pt reports she saw Dr. Aviva Signs 10 days ago when her symptoms originally worsened.   Past Medical History  Diagnosis Date  . Brain aneurysm     METAL COIL PREVENTS MRI IMAGING  . IBS (irritable bowel syndrome)   . GERD (gastroesophageal reflux disease)   . Panic attack   . Anxiety   . Psoriatic arthritis   . ETOH abuse     Quit in 07/2011   . Sjogren's disease   . Cirrhosis     Presumably alcoholic cirrhosis  . Complication of anesthesia   . PONV (postoperative nausea and vomiting)   . HTN (hypertension)     no longer medicated, dated on 04/25/12  . Abdominal hernia    Past Surgical History  Procedure Laterality Date  . Cerebral aneurysm repair    . Esophageal biopsy  10/13/2011    SLF: mild gastritis/duodenal polypoid lesion   . Colonoscopy  10/13/2011    [id number] hemorrhoids/varices rectal/lesion in ascending colon(HYPERPLASTIC POLYP)  . Paracentesis  Feb 2014    10.7 liters  . Colonoscopy N/A 01/17/2013    Procedure: COLONOSCOPY;  Surgeon: Danie Binder, MD;  Location: [facility];  Service: Endoscopy;  Laterality: N/A;  9:30-moved to  12:30 Darius Bump to notify pt   Family History  Problem Relation Age of Onset  . Cirrhosis Mother     nash  . Colon cancer Neg Hx   . Inflammatory bowel disease Neg Hx   . Asthma    . Heart disease    . COPD Father   . Heart disease Father    History  Substance Use Topics  . Smoking status: Former Smoker -- 2.00 packs/day for 12 years    Types: Cigarettes    Quit date: 03/31/1995  . Smokeless tobacco: Not on file     Comment: quit in 1997  . Alcohol Use: No     Comment: Bottle of wine a day for years, but quit in 07/2011   OB History   Grav Para Term Preterm Abortions TAB SAB Ect Mult Living                 Review of Systems  Gastrointestinal: Positive for nausea and abdominal pain (LLQ and umbilical abd pain).    A complete 10 system review of systems was obtained and all systems are negative except as noted in the HPI and PMH.    Allergies  Omeprazole; Other; Phenytoin; Keflex; and Phenergan  Home Medications   Current Outpatient Rx  Name  Route  Sig  Dispense  Refill  . Apremilast (OTEZLA) 30 MG  TABS   Oral   Take 1 tablet by mouth 2 (two) times daily.         . bumetanide (BUMEX) 1 MG tablet   Oral   Take 1 mg by mouth 2 (two) times daily.          . carboxymethylcellulose (REFRESH PLUS) 0.5 % SOLN   Both Eyes   Place 2 drops into both eyes 3 (three) times daily as needed (Dry Eyes).          . Cholecalciferol (VITAMIN D-3) 5000 UNITS TABS   Oral   Take 5,000 mg by mouth daily.         Marland Kitchen escitalopram (LEXAPRO) 20 MG tablet   Oral   Take 20 mg by mouth daily.         Marland Kitchen esomeprazole (NEXIUM) 40 MG capsule   Oral   Take 40 mg by mouth daily before breakfast.         . folic acid (FOLVITE) 1 MG tablet   Oral   Take 1 mg by mouth daily.         Marland Kitchen loratadine (CLARITIN) 10 MG tablet   Oral   Take 10 mg by mouth daily.         . rifaximin (XIFAXAN) 550 MG TABS tablet   Oral   Take 550 mg by mouth 2 (two) times daily.         .  Simethicone (GAS-X EXTRA STRENGTH) 125 MG CAPS   Oral   Take 1 capsule by mouth daily as needed (Gas).          . sodium chloride (OCEAN) 0.65 % nasal spray   Nasal   Place 1 spray into the nose as needed for congestion.         Marland Kitchen spironolactone (ALDACTONE) 100 MG tablet   Oral   Take 100 mg by mouth 2 (two) times daily.           Triage Vitals: BP 128/63  Pulse 70  Temp(Src) 97.9 F (36.6 C) (Oral)  Resp 18  Ht 5\' 7"  (1.702 m)  Wt 205 lb (92.987 kg)  BMI 32.10 kg/m2  SpO2 100% Physical Exam  Nursing note and vitals reviewed. Constitutional: She is oriented to person, place, and time. She appears well-developed and well-nourished.  HENT:  Head: Normocephalic and atraumatic.  Eyes: Conjunctivae and EOM are normal. Pupils are equal, round, and reactive to light.  Neck: Normal range of motion. Neck supple.  Cardiovascular: Normal rate, regular rhythm and normal heart sounds.   Pulmonary/Chest: Effort normal and breath sounds normal.  Abdominal: Soft. Bowel sounds are normal. There is tenderness (4 cm paraumbilical hernia. Very tender. ).  Musculoskeletal: Normal range of motion.  Neurological: She is alert and oriented to person, place, and time.  Skin: Skin is warm and dry.  Psychiatric: She has a normal mood and affect. Her behavior is normal.    ED Course  Hernia reduction Date/Time: 06/02/2013 3:10 PM Performed by: Nat Christen Authorized by: Nat Christen Consent: Verbal consent obtained. Risks and benefits: risks, benefits and alternatives were discussed Consent given by: patient Patient understanding: patient states understanding of the procedure being performed Relevant documents: relevant documents present and verified Patient identity confirmed: verbally with patient Local anesthesia used: no Patient sedated: yes Analgesia: hydromorphone and fentanyl Vitals: Vital signs were monitored during sedation. Patient tolerance: Patient tolerated the procedure well  with no immediate complications. Comments: Umbilical hernia reduced with pressure applied to the  hernia. Reduction was successful. Vital signs are normal.   (including critical care time) DIAGNOSTIC STUDIES: Oxygen Saturation is 100% on RA, normal by my interpretation.    COORDINATION OF CARE:  12:55 PM-Discussed treatment plan which includes administering meds and reducing the hernia at bedside and pt agreed to plan.    Labs Review Labs Reviewed - No data to display Imaging Review No results found.   EKG Interpretation None      MDM   Final diagnoses:  None    Successful reduction of umbilical hernia. Discussed with Dr. Aviva Signs. He will followup with patient on Tuesday.  I personally performed the services described in this documentation, which was scribed in my presence. The recorded information has been reviewed and is accurate.    Nat Christen, MD 06/02/13 1530

## 2013-06-02 NOTE — Discharge Instructions (Signed)
Followup with general surgeon on Tuesday. Return if worse

## 2013-06-05 ENCOUNTER — Other Ambulatory Visit: Payer: Self-pay

## 2013-06-05 DIAGNOSIS — K7460 Unspecified cirrhosis of liver: Secondary | ICD-10-CM

## 2013-06-06 ENCOUNTER — Encounter (HOSPITAL_COMMUNITY): Payer: Self-pay

## 2013-06-06 ENCOUNTER — Telehealth: Payer: Self-pay

## 2013-06-06 DIAGNOSIS — K7460 Unspecified cirrhosis of liver: Secondary | ICD-10-CM

## 2013-06-06 LAB — AFP TUMOR MARKER: AFP-Tumor Marker: 4.1 ng/mL (ref 0.0–8.0)

## 2013-06-06 NOTE — Telephone Encounter (Signed)
PLEASE CALL PT. Anethesiology is familiar with her her history. I agree with proceeding with hernia repair.

## 2013-06-06 NOTE — Telephone Encounter (Signed)
Pt left VM that she wanted Dr. Oneida Alar to know that she is scheduled for her hernia surgery on 06/09/2013/  She has pre-op tomorrow. She said she didn't know if Dr. Oneida Alar needs to say anything to the anaesthesiologist. But she wanted to make her aware.

## 2013-06-06 NOTE — Telephone Encounter (Signed)
Called and left the message on home VM.

## 2013-06-07 ENCOUNTER — Encounter (HOSPITAL_COMMUNITY)
Admission: RE | Admit: 2013-06-07 | Discharge: 2013-06-07 | Disposition: A | Discharge status: Home / Self Care | Payer: BC Managed Care – PPO | Source: Ambulatory Visit | Attending: General Surgery | Admitting: General Surgery

## 2013-06-07 ENCOUNTER — Encounter (HOSPITAL_COMMUNITY): Payer: Self-pay

## 2013-06-07 HISTORY — DX: Unspecified osteoarthritis, unspecified site: M19.90

## 2013-06-07 HISTORY — DX: Other fatigue: R53.83

## 2013-06-07 HISTORY — DX: Unspecified convulsions: R56.9

## 2013-06-07 NOTE — Patient Instructions (Signed)
Deanna Thompson  06/07/2013   Your procedure is scheduled on:   06/09/2013  Report to [facility] at  69  AM.  Call this number if you have problems the morning of surgery: [phone]   Remember:   Do not eat food or drink liquids after midnight.   Take these medicines the morning of surgery with A SIP OF WATER: appremalist, lexapro, nexium, claritin, spironalactone   Do not wear jewelry, make-up or nail polish.  Do not wear lotions, powders, or perfumes.   Do not shave 48 hours prior to surgery. Men may shave face and neck.  Do not bring valuables to the hospital.  [facility] is not responsible for any belongings or valuables.               Contacts, dentures or bridgework may not be worn into surgery.  Leave suitcase in the car. After surgery it may be brought to your room.  For patients admitted to the hospital, discharge time is determined by your treatment team.               Patients discharged the day of surgery will not be allowed to drive home.  Name and phone number of your driver: family  Special Instructions: Shower using CHG 2 nights before surgery and the night before surgery.  If you shower the day of surgery use CHG.  Use special wash - you have one bottle of CHG for all showers.  You should use approximately 1/3 of the bottle for each shower.   Please read over the following fact sheets that you were given: Pain Booklet, Coughing and Deep Breathing, Surgical Site Infection Prevention, Anesthesia Post-op Instructions and Care and Recovery After Surgery Hernia A hernia occurs when an internal organ pushes out through a weak spot in the abdominal wall. Hernias most commonly occur in the groin and around the navel. Hernias often can be pushed back into place (reduced). Most hernias tend to get worse over time. Some abdominal hernias can get stuck in the opening (irreducible or incarcerated hernia) and cannot be reduced. An irreducible abdominal hernia which is  tightly squeezed into the opening is at risk for impaired blood supply (strangulated hernia). A strangulated hernia is a medical emergency. Because of the risk for an irreducible or strangulated hernia, surgery may be recommended to repair a hernia. CAUSES   Heavy lifting.  Prolonged coughing.  Straining to have a bowel movement.  A cut (incision) made during an abdominal surgery. HOME CARE INSTRUCTIONS   Bed rest is not required. You may continue your normal activities.  Avoid lifting more than 10 pounds (4.5 kg) or straining.  Cough gently. If you are a smoker it is best to stop. Even the best hernia repair can break down with the continual strain of coughing. Even if you do not have your hernia repaired, a cough will continue to aggravate the problem.  Do not wear anything tight over your hernia. Do not try to keep it in with an outside bandage or truss. These can damage abdominal contents if they are trapped within the hernia sac.  Eat a normal diet.  Avoid constipation. Straining over long periods of time will increase hernia size and encourage breakdown of repairs. If you cannot do this with diet alone, stool softeners may be used. SEEK IMMEDIATE MEDICAL CARE IF:   You have a fever.  You develop increasing abdominal pain.  You feel nauseous or vomit.  Your hernia is stuck outside the abdomen, looks discolored, feels hard, or is tender.  You have any changes in your bowel habits or in the hernia that are unusual for you.  You have increased pain or swelling around the hernia.  You cannot push the hernia back in place by applying gentle pressure while lying down. MAKE SURE YOU:   Understand these instructions.  Will watch your condition.  Will get help right away if you are not doing well or get worse. Document Released: 03/16/2005 Document Revised: 06/08/2011 Document Reviewed: 11/03/2007 [facility] Patient Information 2014 [place]. PATIENT  INSTRUCTIONS POST-ANESTHESIA  IMMEDIATELY FOLLOWING SURGERY:  Do not drive or operate machinery for the first twenty four hours after surgery.  Do not make any important decisions for twenty four hours after surgery or while taking narcotic pain medications or sedatives.  If you develop intractable nausea and vomiting or a severe headache please notify your doctor immediately.  FOLLOW-UP:  Please make an appointment with your surgeon as instructed. You do not need to follow up with anesthesia unless specifically instructed to do so.  WOUND CARE INSTRUCTIONS (if applicable):  Keep a dry clean dressing on the anesthesia/puncture wound site if there is drainage.  Once the wound has quit draining you may leave it open to air.  Generally you should leave the bandage intact for twenty four hours unless there is drainage.  If the epidural site drains for more than 36-48 hours please call the anesthesia department.  QUESTIONS?:  Please feel free to call your physician or the hospital operator if you have any questions, and they will be happy to assist you.

## 2013-06-07 NOTE — H&P (Signed)
  NTS SOAP Note  Vital Signs:  Vitals as of: 0/76/2263: Systolic 335: Diastolic 83: Heart Rate 84: Temp 98.60F: Height 62ft 8in: Weight 207Lbs 0 Ounces: BMI 31.47  BMI : 31.47 kg/m2  Subjective: This 14 Years 109 Months old Female presents for of an umbilical hernia.  Has been present for some time, but recently has had two episodes of incarceration where it has to be reduced in the ER.  Other times, is able to reduce on own.  Presents for scheduling of repair.  I have seen her once in ER for reduction.  States her cirrhosis/ascites has significantly improved, and no significant ascites noted.  Review of Symptoms:  Constitutional:  fatigue,weakness Head:unremarkable    Eyes:unremarkable   Nose/Mouth/Throat:unremarkable Cardiovascular:  unremarkable   Respiratory:unremarkable   Gastrointestin    abdominal pain,nausea,heartburn Genitourinary:unremarkable       joint and back pain Skin:unremarkable Hematolgic/Lymphatic:unremarkable     Allergic/Immunologic:unremarkable     Past Medical History:    Reviewed  Past Medical History  Surgical History: cerebral hemorrhage repair 1997 Medical Problems: cirrhosis, reflux Allergies: keflex, dilantin, omeprazole Medications: spironolactone, xifair, folic acid, lexapro, otesla   Social History:Reviewed  Social History  Preferred Language: English Race:  White Ethnicity: Not Hispanic / Latino Age: 73 Years 4 Months Marital Status:  M Alcohol: no Recreational drug(s): no   Smoking Status: Never smoker reviewed on 06/06/2013 Functional Status reviewed on 06/06/2013 ------------------------------------------------ Bathing: Normal Cooking: Normal Dressing: Normal Driving: Normal Eating: Normal Managing Meds: Normal Oral Care: Normal Shopping: Normal Toileting: Normal Transferring: Normal Walking: Normal Cognitive Status reviewed on  06/06/2013 ------------------------------------------------ Attention: Normal Decision Making: Normal Language: Normal Memory: Normal Motor: Normal Perception: Normal Problem Solving: Normal Visual and Spatial: Normal   Family History:  Reviewed  Family Health History Mother, Living; Disease of liver;  Father, Living; Chronic obstructive lung disease (COPD);     Objective Information: General:unremarkable   Heart:  RRR, no murmur Lungs:    CTA bilaterally, no wheezes, rhonchi, rales.  Breathing unlabored. Abdomen:Soft, NT/ND, normal bowel sounds, no HSM, no masses.  No peritoneal signs.  Reducible umbilical hernia with thin skin present in umbilicus.  Reducible right inguinal hernia noted.  Assessment:Umbilical hernia Asmptomatic right inguinal hernia Diagnoses: 456.2 Umbilical hernia (Umbilical hernia without obstruction or gangrene)  Procedures: 99213 - OFFICE OUTPATIENT VISIT 15 MINUTES    Plan:  Scheduled for umbilical herniorrhaphy with mesh on 06/09/13.   Patient Education:Alternative treatments to surgery were discussed with patient (and family).  Risks and benefits  of procedure including bleeding, infection, recurrence of the hernia, and the possility of losing the umbilical skin were fully explained to the patient (and family) who gave informed consent. Patient/family questions were addressed.  Follow-up:Pending Surgery

## 2013-06-07 NOTE — Pre-Procedure Instructions (Signed)
Patient given information to sign up with mychart at home.

## 2013-06-08 ENCOUNTER — Encounter (HOSPITAL_COMMUNITY): Payer: Self-pay | Admitting: Emergency Medicine

## 2013-06-08 ENCOUNTER — Ambulatory Visit: Payer: BC Managed Care – PPO | Admitting: Gastroenterology

## 2013-06-08 ENCOUNTER — Emergency Department (HOSPITAL_COMMUNITY)
Admission: EM | Admit: 2013-06-08 | Discharge: 2013-06-08 | Disposition: A | Discharge status: Home / Self Care | Payer: BC Managed Care – PPO | Attending: Emergency Medicine | Admitting: Emergency Medicine

## 2013-06-08 DIAGNOSIS — Z872 Personal history of diseases of the skin and subcutaneous tissue: Secondary | ICD-10-CM | POA: Insufficient documentation

## 2013-06-08 DIAGNOSIS — K589 Irritable bowel syndrome without diarrhea: Secondary | ICD-10-CM | POA: Insufficient documentation

## 2013-06-08 DIAGNOSIS — R112 Nausea with vomiting, unspecified: Secondary | ICD-10-CM | POA: Insufficient documentation

## 2013-06-08 DIAGNOSIS — K429 Umbilical hernia without obstruction or gangrene: Secondary | ICD-10-CM

## 2013-06-08 DIAGNOSIS — Z9889 Other specified postprocedural states: Secondary | ICD-10-CM | POA: Insufficient documentation

## 2013-06-08 DIAGNOSIS — K219 Gastro-esophageal reflux disease without esophagitis: Secondary | ICD-10-CM | POA: Insufficient documentation

## 2013-06-08 DIAGNOSIS — Z792 Long term (current) use of antibiotics: Secondary | ICD-10-CM | POA: Insufficient documentation

## 2013-06-08 DIAGNOSIS — F411 Generalized anxiety disorder: Secondary | ICD-10-CM | POA: Insufficient documentation

## 2013-06-08 DIAGNOSIS — M129 Arthropathy, unspecified: Secondary | ICD-10-CM | POA: Insufficient documentation

## 2013-06-08 DIAGNOSIS — Z8669 Personal history of other diseases of the nervous system and sense organs: Secondary | ICD-10-CM | POA: Insufficient documentation

## 2013-06-08 DIAGNOSIS — Z87891 Personal history of nicotine dependence: Secondary | ICD-10-CM | POA: Insufficient documentation

## 2013-06-08 DIAGNOSIS — I10 Essential (primary) hypertension: Secondary | ICD-10-CM | POA: Insufficient documentation

## 2013-06-08 DIAGNOSIS — Z79899 Other long term (current) drug therapy: Secondary | ICD-10-CM | POA: Insufficient documentation

## 2013-06-08 LAB — COMPREHENSIVE METABOLIC PANEL
ALBUMIN: 3.8 g/dL (ref 3.5–5.2)
ALK PHOS: 152 U/L — AB (ref 39–117)
ALT: 17 U/L (ref 0–35)
AST: 25 U/L (ref 0–37)
BUN: 12 mg/dL (ref 6–23)
CO2: 26 mEq/L (ref 19–32)
Calcium: 10.4 mg/dL (ref 8.4–10.5)
Chloride: 99 mEq/L (ref 96–112)
Creatinine, Ser: 0.92 mg/dL (ref 0.50–1.10)
GFR calc Af Amer: 79 mL/min — ABNORMAL LOW (ref >90)
GFR calc non Af Amer: 68 mL/min — ABNORMAL LOW (ref >90)
Glucose, Bld: 148 mg/dL — ABNORMAL HIGH (ref 70–99)
Potassium: 3.7 mEq/L (ref 3.7–5.3)
SODIUM: 141 meq/L (ref 137–147)
Total Bilirubin: 0.5 mg/dL (ref 0.3–1.2)
Total Protein: 8.6 g/dL — ABNORMAL HIGH (ref 6.0–8.3)

## 2013-06-08 LAB — URINALYSIS, ROUTINE W REFLEX MICROSCOPIC
Bilirubin Urine: NEGATIVE
Glucose, UA: NEGATIVE mg/dL
HGB URINE DIPSTICK: NEGATIVE
Ketones, ur: NEGATIVE mg/dL
Leukocytes, UA: NEGATIVE
Nitrite: NEGATIVE
Protein, ur: NEGATIVE mg/dL
Specific Gravity, Urine: 1.025 (ref 1.005–1.030)
Urobilinogen, UA: 0.2 mg/dL (ref 0.0–1.0)
pH: 5.5 (ref 5.0–8.0)

## 2013-06-08 LAB — CBC WITH DIFFERENTIAL/PLATELET
BASOS PCT: 0 % (ref 0–1)
Basophils Absolute: 0 10*3/uL (ref 0.0–0.1)
EOS ABS: 0 10*3/uL (ref 0.0–0.7)
Eosinophils Relative: 0 % (ref 0–5)
HCT: 34.6 % — ABNORMAL LOW (ref 36.0–46.0)
Hemoglobin: 11.6 g/dL — ABNORMAL LOW (ref 12.0–15.0)
Lymphocytes Relative: 11 % — ABNORMAL LOW (ref 12–46)
Lymphs Abs: 0.8 10*3/uL (ref 0.7–4.0)
MCH: 28.5 pg (ref 26.0–34.0)
MCHC: 33.5 g/dL (ref 30.0–36.0)
MCV: 85 fL (ref 78.0–100.0)
Monocytes Absolute: 0.5 10*3/uL (ref 0.1–1.0)
Monocytes Relative: 7 % (ref 3–12)
Neutro Abs: 6.3 10*3/uL (ref 1.7–7.7)
Neutrophils Relative %: 82 % — ABNORMAL HIGH (ref 43–77)
PLATELETS: 235 10*3/uL (ref 150–400)
RBC: 4.07 MIL/uL (ref 3.87–5.11)
RDW: 14.7 % (ref 11.5–15.5)
WBC: 7.8 10*3/uL (ref 4.0–10.5)

## 2013-06-08 LAB — LIPASE, BLOOD: LIPASE: 52 U/L (ref 11–59)

## 2013-06-08 LAB — LACTIC ACID, PLASMA: Lactic Acid, Venous: 1.9 mmol/L (ref 0.5–2.2)

## 2013-06-08 MED ORDER — OXYCODONE HCL 5 MG PO TABS: 5.0000 mg | ORAL_TABLET | ORAL | Status: DC | PRN

## 2013-06-08 MED ORDER — HYDROMORPHONE HCL PF 1 MG/ML IJ SOLN
1.0000 mg | Freq: Once | INTRAMUSCULAR | Status: AC
  Administered 2013-06-08: 1 mg via INTRAVENOUS
  Filled 2013-06-08: qty 1

## 2013-06-08 MED ORDER — LORAZEPAM 2 MG/ML IJ SOLN
1.0000 mg | Freq: Once | INTRAMUSCULAR | Status: AC
  Administered 2013-06-08: 1 mg via INTRAVENOUS
  Filled 2013-06-08: qty 1

## 2013-06-08 NOTE — ED Notes (Signed)
Patient c/o mid abd pain with nausea, vomiting, and diarrhea. Per patient has abd hernia in which she is scheduled to have surgery on in AM. Per patient was at PCP today and had hernia "popped" back in which came back out within 3 minutes per patient.

## 2013-06-08 NOTE — ED Provider Notes (Signed)
CSN: [id number]     Arrival date & time 06/08/13  1458 History   First MD Initiated Contact with Patient 06/08/13 1814     Chief Complaint  Patient presents with  . Abdominal Pain  . Emesis     (Consider location/radiation/quality/duration/timing/severity/associated sxs/prior Treatment) HPI Comments: Patient complains of periumbilical abdominal pain at the site of her hernia. She's had a hernia for more than a year has had issues with incarceration in the past. She states this been out since about noon today. She saw Dr. Arnoldo Morale this afternoon in his office and he was able to reduce it but it promptly came back out. Is now been out for about the past 5 hours. She had one episode of vomiting this morning. She denies any chest pain or shortness of breath. She denies any change in the previous pain of the hernia. She is scheduled for surgical repair by Dr. Arnoldo Morale tomorrow.  The history is provided by the patient and the spouse.    Past Medical History  Diagnosis Date  . IBS (irritable bowel syndrome)   . GERD (gastroesophageal reflux disease)   . Panic attack   . Anxiety   . Psoriatic arthritis   . ETOH abuse     Quit in 07/2011   . Sjogren's disease   . Cirrhosis     Presumably alcoholic cirrhosis  . Complication of anesthesia   . PONV (postoperative nausea and vomiting)   . Abdominal hernia   . 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 Seizures 1997    1 seizure from coil insertion but no more seizures and no meds for seizures  . Arthritis   . HTN (hypertension)     no longer medicated, dated on 04/25/12  . Fatigue     due to Sjogren's disease   Past Surgical History  Procedure Laterality Date  . Cerebral aneurysm repair    . Esophageal biopsy  10/13/2011    SLF: mild gastritis/duodenal polypoid lesion   . Colonoscopy  10/13/2011    ZJI:[id number] hemorrhoids/varices rectal/lesion in ascending colon(HYPERPLASTIC POLYP)  . Paracentesis  Feb 2014    10.7  liters  . Colonoscopy N/A 01/17/2013    Procedure: COLONOSCOPY;  Surgeon: Danie Binder, MD;  Location: [facility];  Service: Endoscopy;  Laterality: N/A;  9:30-moved to 12:30 Darius Bump to notify pt   Family History  Problem Relation Age of Onset  . Cirrhosis Mother     nash  . Colon cancer Neg Hx   . Inflammatory bowel disease Neg Hx   . Asthma    . Heart disease    . COPD Father   . Heart disease Father    History  Substance Use Topics  . Smoking status: Former Smoker -- 2.00 packs/day for 12 years    Types: Cigarettes    Quit date: 03/31/1995  . Smokeless tobacco: Never Used     Comment: quit in 1997  . Alcohol Use: No     Comment: Bottle of wine a day for years, but quit in 07/2011   OB History   Grav Para Term Preterm Abortions TAB SAB Ect Mult Living            0     Review of Systems  Constitutional: Negative for activity change and appetite change.  Respiratory: Negative for cough, chest tightness and shortness of breath.   Cardiovascular: Negative for chest pain.  Gastrointestinal: Positive for nausea,  vomiting and abdominal pain.  Genitourinary: Negative for dysuria, vaginal bleeding and vaginal discharge.  Musculoskeletal: Negative for arthralgias and back pain.  Skin: Negative for rash.  Neurological: Negative for dizziness and headaches.  A complete 10 system review of systems was obtained and all systems are negative except as noted in the HPI and PMH.      Allergies  Omeprazole; Other; Phenytoin; Keflex; and Phenergan  Home Medications   Current Outpatient Rx  Name  Route  Sig  Dispense  Refill  . Apremilast (OTEZLA) 30 MG TABS   Oral   Take 1 tablet by mouth 2 (two) times daily.         . bumetanide (BUMEX) 1 MG tablet   Oral   Take 1 mg by mouth 2 (two) times daily.          . carboxymethylcellulose (REFRESH PLUS) 0.5 % SOLN   Both Eyes   Place 2 drops into both eyes 3 (three) times daily as needed (Dry Eyes).          .  Cholecalciferol (VITAMIN D-3) 5000 UNITS TABS   Oral   Take 5,000 mg by mouth daily.         Marland Kitchen escitalopram (LEXAPRO) 20 MG tablet   Oral   Take 20 mg by mouth at bedtime.          Marland Kitchen esomeprazole (NEXIUM) 40 MG capsule   Oral   Take 40 mg by mouth daily before breakfast.         . folic acid (FOLVITE) 1 MG tablet   Oral   Take 1 mg by mouth daily.         Marland Kitchen loratadine (CLARITIN) 10 MG tablet   Oral   Take 10 mg by mouth daily.         . rifaximin (XIFAXAN) 550 MG TABS tablet   Oral   Take 550 mg by mouth 2 (two) times daily.         . Simethicone (GAS-X EXTRA STRENGTH) 125 MG CAPS   Oral   Take 1 capsule by mouth daily as needed (Gas).          . sodium chloride (OCEAN) 0.65 % nasal spray   Nasal   Place 1 spray into the nose as needed for congestion.         Marland Kitchen spironolactone (ALDACTONE) 100 MG tablet   Oral   Take 100 mg by mouth 2 (two) times daily.          Marland Kitchen oxyCODONE (ROXICODONE) 5 MG immediate release tablet   Oral   Take 1 tablet (5 mg total) by mouth every 4 (four) hours as needed for severe pain.   15 tablet   0    BP 139/65  Pulse 71  Temp(Src) 98.2 F (36.8 C) (Oral)  Resp 18  Ht 5\' 8"  (1.727 m)  Wt 205 lb (92.987 kg)  BMI 31.18 kg/m2  SpO2 100% Physical Exam  Constitutional: She is oriented to person, place, and time. She appears well-developed and well-nourished. No distress.  HENT:  Head: Normocephalic and atraumatic.  Mouth/Throat: Oropharynx is clear and moist. No oropharyngeal exudate.  Eyes: Conjunctivae and EOM are normal. Pupils are equal, round, and reactive to light.  Neck: Normal range of motion. Neck supple.  Cardiovascular: Normal rate, regular rhythm and normal heart sounds.   Pulmonary/Chest: Effort normal and breath sounds normal. No respiratory distress.  Abdominal: Soft. There is tenderness. There is no rebound and  no guarding.  Small umbilical hernia TTP with dark discoloration which is chronic per patient.   Musculoskeletal: Normal range of motion. She exhibits no edema.  Neurological: She is alert and oriented to person, place, and time. No cranial nerve deficit. She exhibits normal muscle tone. Coordination normal.  Skin: Skin is warm.    ED Course  Hernia reduction Date/Time: 06/08/2013 8:25 PM Performed by: Ezequiel Essex Authorized by: Ezequiel Essex Consent: Verbal consent obtained. Risks and benefits: risks, benefits and alternatives were discussed Consent given by: patient Patient understanding: patient states understanding of the procedure being performed Patient identity confirmed: provided demographic data and verbally with patient Time out: Immediately prior to procedure a "time out" was called to verify the correct patient, procedure, equipment, support staff and site/side marked as required. Local anesthesia used: no Patient sedated: yes Sedation type: anxiolysis Sedatives: lorazepam Analgesia: hydromorphone Sedation start date/time: 06/08/2013 8:00 PM Sedation end date/time: 06/08/2013 8:05 PM Vitals: Vital signs were monitored during sedation. Patient tolerance: Patient tolerated the procedure well with no immediate complications.   (including critical care time) Labs Review Labs Reviewed  CBC WITH DIFFERENTIAL - Abnormal; Notable for the following:    Hemoglobin 11.6 (*)    HCT 34.6 (*)    Neutrophils Relative % 82 (*)    Lymphocytes Relative 11 (*)    All other components within normal limits  COMPREHENSIVE METABOLIC PANEL - Abnormal; Notable for the following:    Glucose, Bld 148 (*)    Total Protein 8.6 (*)    Alkaline Phosphatase 152 (*)    GFR calc non Af Amer 68 (*)    GFR calc Af Amer 79 (*)    All other components within normal limits  LIPASE, BLOOD  LACTIC ACID, PLASMA  URINALYSIS, ROUTINE W REFLEX MICROSCOPIC   Imaging Review No results found.   EKG Interpretation None      MDM   Final diagnoses:  Umbilical hernia   Periumbilical  hernia that is difficult to reduce. Abdomen is otherwise soft and nontender. Patient is in no distress.  Bed placed in Trendelenburg position. Patient given IV narcotics and benzodiazepines. Bedside reduction was achieved.  Hernia reduced at bedside as above. Discussed with Dr. Arnoldo Morale. He agrees patient can go home and return tomorrow as scheduled for her surgery. He advises her to go home and do nothing. He states she can return as early as 6:30 AM and he will be in the hospital. Return precautions discussed. Patient knows how to reduce the hernia herself.    Ezequiel Essex, MD 06/08/13 2033

## 2013-06-08 NOTE — Discharge Instructions (Signed)
Hernia Follow up for surgery tomorrow as scheduled. Return to the ED if you develop new or worsening symptoms. A hernia occurs when an internal organ pushes out through a weak spot in the abdominal wall. Hernias most commonly occur in the groin and around the navel. Hernias often can be pushed back into place (reduced). Most hernias tend to get worse over time. Some abdominal hernias can get stuck in the opening (irreducible or incarcerated hernia) and cannot be reduced. An irreducible abdominal hernia which is tightly squeezed into the opening is at risk for impaired blood supply (strangulated hernia). A strangulated hernia is a medical emergency. Because of the risk for an irreducible or strangulated hernia, surgery may be recommended to repair a hernia. CAUSES   Heavy lifting.  Prolonged coughing.  Straining to have a bowel movement.  A cut (incision) made during an abdominal surgery. HOME CARE INSTRUCTIONS   Bed rest is not required. You may continue your normal activities.  Avoid lifting more than 10 pounds (4.5 kg) or straining.  Cough gently. If you are a smoker it is best to stop. Even the best hernia repair can break down with the continual strain of coughing. Even if you do not have your hernia repaired, a cough will continue to aggravate the problem.  Do not wear anything tight over your hernia. Do not try to keep it in with an outside bandage or truss. These can damage abdominal contents if they are trapped within the hernia sac.  Eat a normal diet.  Avoid constipation. Straining over long periods of time will increase hernia size and encourage breakdown of repairs. If you cannot do this with diet alone, stool softeners may be used. SEEK IMMEDIATE MEDICAL CARE IF:   You have a fever.  You develop increasing abdominal pain.  You feel nauseous or vomit.  Your hernia is stuck outside the abdomen, looks discolored, feels hard, or is tender.  You have any changes in your  bowel habits or in the hernia that are unusual for you.  You have increased pain or swelling around the hernia.  You cannot push the hernia back in place by applying gentle pressure while lying down. MAKE SURE YOU:   Understand these instructions.  Will watch your condition.  Will get help right away if you are not doing well or get worse. Document Released: 03/16/2005 Document Revised: 06/08/2011 Document Reviewed: 11/03/2007 [facility] Patient Information 2014 [place].

## 2013-06-09 ENCOUNTER — Encounter (HOSPITAL_COMMUNITY): Payer: BC Managed Care – PPO | Admitting: Anesthesiology

## 2013-06-09 ENCOUNTER — Ambulatory Visit (HOSPITAL_COMMUNITY)
Admission: RE | Admit: 2013-06-09 | Discharge: 2013-06-09 | Disposition: A | Discharge status: Home / Self Care | Payer: BC Managed Care – PPO | Source: Ambulatory Visit | Attending: General Surgery | Admitting: General Surgery

## 2013-06-09 ENCOUNTER — Ambulatory Visit (HOSPITAL_COMMUNITY): Payer: BC Managed Care – PPO | Admitting: Anesthesiology

## 2013-06-09 ENCOUNTER — Encounter (HOSPITAL_COMMUNITY)
Admission: RE | Disposition: A | Discharge status: Home / Self Care | Payer: Self-pay | Source: Ambulatory Visit | Attending: General Surgery

## 2013-06-09 ENCOUNTER — Encounter (HOSPITAL_COMMUNITY): Payer: Self-pay | Admitting: *Deleted

## 2013-06-09 DIAGNOSIS — Z01810 Encounter for preprocedural cardiovascular examination: Secondary | ICD-10-CM | POA: Insufficient documentation

## 2013-06-09 DIAGNOSIS — I10 Essential (primary) hypertension: Secondary | ICD-10-CM | POA: Insufficient documentation

## 2013-06-09 DIAGNOSIS — K759 Inflammatory liver disease, unspecified: Secondary | ICD-10-CM | POA: Insufficient documentation

## 2013-06-09 DIAGNOSIS — I739 Peripheral vascular disease, unspecified: Secondary | ICD-10-CM | POA: Insufficient documentation

## 2013-06-09 DIAGNOSIS — R569 Unspecified convulsions: Secondary | ICD-10-CM | POA: Insufficient documentation

## 2013-06-09 DIAGNOSIS — K4090 Unilateral inguinal hernia, without obstruction or gangrene, not specified as recurrent: Secondary | ICD-10-CM | POA: Insufficient documentation

## 2013-06-09 DIAGNOSIS — K7460 Unspecified cirrhosis of liver: Secondary | ICD-10-CM | POA: Insufficient documentation

## 2013-06-09 DIAGNOSIS — Z87891 Personal history of nicotine dependence: Secondary | ICD-10-CM | POA: Insufficient documentation

## 2013-06-09 DIAGNOSIS — K429 Umbilical hernia without obstruction or gangrene: Secondary | ICD-10-CM | POA: Insufficient documentation

## 2013-06-09 DIAGNOSIS — K219 Gastro-esophageal reflux disease without esophagitis: Secondary | ICD-10-CM | POA: Insufficient documentation

## 2013-06-09 HISTORY — PX: INSERTION OF MESH: SHX5868

## 2013-06-09 HISTORY — PX: UMBILICAL HERNIA REPAIR: SHX196

## 2013-06-09 SURGERY — REPAIR, HERNIA, UMBILICAL, ADULT
Anesthesia: General | Site: Abdomen

## 2013-06-09 MED ORDER — BUPIVACAINE HCL (PF) 0.5 % IJ SOLN
INTRAMUSCULAR | Status: DC | PRN
  Administered 2013-06-09: 10 mL

## 2013-06-09 MED ORDER — CHLORHEXIDINE GLUCONATE 4 % EX LIQD: 1.0000 "application " | Freq: Once | CUTANEOUS | Status: DC

## 2013-06-09 MED ORDER — SCOPOLAMINE 1 MG/3DAYS TD PT72
MEDICATED_PATCH | TRANSDERMAL | Status: AC
  Filled 2013-06-09: qty 1

## 2013-06-09 MED ORDER — FENTANYL CITRATE 0.05 MG/ML IJ SOLN
25.0000 ug | INTRAMUSCULAR | Status: AC
  Administered 2013-06-09 (×2): 25 ug via INTRAVENOUS

## 2013-06-09 MED ORDER — OXYCODONE-ACETAMINOPHEN 7.5-325 MG PO TABS: 1.0000 | ORAL_TABLET | ORAL | Status: DC | PRN

## 2013-06-09 MED ORDER — FENTANYL CITRATE 0.05 MG/ML IJ SOLN
INTRAMUSCULAR | Status: DC | PRN
  Administered 2013-06-09: 25 ug via INTRAVENOUS
  Administered 2013-06-09: 50 ug via INTRAVENOUS
  Administered 2013-06-09: 75 ug via INTRAVENOUS
  Administered 2013-06-09: 50 ug via INTRAVENOUS

## 2013-06-09 MED ORDER — MIDAZOLAM HCL 2 MG/2ML IJ SOLN
INTRAMUSCULAR | Status: AC
  Filled 2013-06-09: qty 2

## 2013-06-09 MED ORDER — SUCCINYLCHOLINE CHLORIDE 20 MG/ML IJ SOLN
INTRAMUSCULAR | Status: AC
  Filled 2013-06-09: qty 1

## 2013-06-09 MED ORDER — LACTATED RINGERS IV SOLN
INTRAVENOUS | Status: DC
  Administered 2013-06-09: 10:00:00 via INTRAVENOUS

## 2013-06-09 MED ORDER — SODIUM CHLORIDE 0.9 % IR SOLN
Status: DC | PRN
  Administered 2013-06-09: 1000 mL

## 2013-06-09 MED ORDER — FENTANYL CITRATE 0.05 MG/ML IJ SOLN
INTRAMUSCULAR | Status: AC
  Filled 2013-06-09: qty 2

## 2013-06-09 MED ORDER — KETOROLAC TROMETHAMINE 30 MG/ML IJ SOLN
30.0000 mg | Freq: Once | INTRAMUSCULAR | Status: AC
  Administered 2013-06-09: 30 mg via INTRAVENOUS

## 2013-06-09 MED ORDER — SUCCINYLCHOLINE CHLORIDE 20 MG/ML IJ SOLN
INTRAMUSCULAR | Status: DC | PRN
  Administered 2013-06-09: 120 mg via INTRAVENOUS

## 2013-06-09 MED ORDER — GLYCOPYRROLATE 0.2 MG/ML IJ SOLN
INTRAMUSCULAR | Status: AC
  Filled 2013-06-09: qty 3

## 2013-06-09 MED ORDER — POVIDONE-IODINE 10 % OINT PACKET
TOPICAL_OINTMENT | CUTANEOUS | Status: DC | PRN
  Administered 2013-06-09: 1 via TOPICAL

## 2013-06-09 MED ORDER — LIDOCAINE HCL (PF) 1 % IJ SOLN
INTRAMUSCULAR | Status: AC
  Filled 2013-06-09: qty 5

## 2013-06-09 MED ORDER — KETOROLAC TROMETHAMINE 30 MG/ML IJ SOLN
INTRAMUSCULAR | Status: AC
  Filled 2013-06-09: qty 1

## 2013-06-09 MED ORDER — VANCOMYCIN HCL IN DEXTROSE 1-5 GM/200ML-% IV SOLN
INTRAVENOUS | Status: AC
  Filled 2013-06-09: qty 200

## 2013-06-09 MED ORDER — MIDAZOLAM HCL 2 MG/2ML IJ SOLN
1.0000 mg | INTRAMUSCULAR | Status: AC | PRN
  Administered 2013-06-09 (×3): 2 mg via INTRAVENOUS

## 2013-06-09 MED ORDER — PROPOFOL 10 MG/ML IV BOLUS
INTRAVENOUS | Status: DC | PRN
  Administered 2013-06-09: 150 mg via INTRAVENOUS

## 2013-06-09 MED ORDER — ONDANSETRON HCL 4 MG/2ML IJ SOLN
4.0000 mg | Freq: Once | INTRAMUSCULAR | Status: AC
  Administered 2013-06-09: 4 mg via INTRAVENOUS

## 2013-06-09 MED ORDER — DEXAMETHASONE SODIUM PHOSPHATE 4 MG/ML IJ SOLN
4.0000 mg | Freq: Once | INTRAMUSCULAR | Status: AC
  Administered 2013-06-09: 4 mg via INTRAVENOUS

## 2013-06-09 MED ORDER — ROCURONIUM BROMIDE 100 MG/10ML IV SOLN
INTRAVENOUS | Status: DC | PRN
  Administered 2013-06-09: 30 mg via INTRAVENOUS
  Administered 2013-06-09: 5 mg via INTRAVENOUS

## 2013-06-09 MED ORDER — GLYCOPYRROLATE 0.2 MG/ML IJ SOLN
INTRAMUSCULAR | Status: DC | PRN
  Administered 2013-06-09: 0.6 mg via INTRAVENOUS

## 2013-06-09 MED ORDER — ROCURONIUM BROMIDE 50 MG/5ML IV SOLN
INTRAVENOUS | Status: AC
  Filled 2013-06-09: qty 1

## 2013-06-09 MED ORDER — NEOSTIGMINE METHYLSULFATE 1 MG/ML IJ SOLN
INTRAMUSCULAR | Status: DC | PRN
  Administered 2013-06-09: 3 mg via INTRAVENOUS

## 2013-06-09 MED ORDER — ONDANSETRON HCL 4 MG/2ML IJ SOLN
INTRAMUSCULAR | Status: AC
  Filled 2013-06-09: qty 2

## 2013-06-09 MED ORDER — FENTANYL CITRATE 0.05 MG/ML IJ SOLN
25.0000 ug | INTRAMUSCULAR | Status: DC | PRN
  Administered 2013-06-09: 25 ug via INTRAVENOUS
  Administered 2013-06-09: 50 ug via INTRAVENOUS
  Administered 2013-06-09: 25 ug via INTRAVENOUS

## 2013-06-09 MED ORDER — POVIDONE-IODINE 10 % EX OINT
TOPICAL_OINTMENT | CUTANEOUS | Status: AC
  Filled 2013-06-09: qty 1

## 2013-06-09 MED ORDER — DEXAMETHASONE SODIUM PHOSPHATE 4 MG/ML IJ SOLN
INTRAMUSCULAR | Status: AC
  Filled 2013-06-09: qty 1

## 2013-06-09 MED ORDER — FENTANYL CITRATE 0.05 MG/ML IJ SOLN
INTRAMUSCULAR | Status: AC
Start: 2013-06-09 — End: 2013-06-09
  Filled 2013-06-09: qty 5

## 2013-06-09 MED ORDER — BUPIVACAINE HCL (PF) 0.5 % IJ SOLN
INTRAMUSCULAR | Status: AC
  Filled 2013-06-09: qty 30

## 2013-06-09 MED ORDER — VANCOMYCIN HCL IN DEXTROSE 1-5 GM/200ML-% IV SOLN
1000.0000 mg | INTRAVENOUS | Status: AC
  Administered 2013-06-09: 1000 mg via INTRAVENOUS

## 2013-06-09 MED ORDER — ONDANSETRON HCL 4 MG/2ML IJ SOLN: 4.0000 mg | Freq: Once | INTRAMUSCULAR | Status: DC | PRN

## 2013-06-09 MED ORDER — PROPOFOL 10 MG/ML IV BOLUS
INTRAVENOUS | Status: AC
  Filled 2013-06-09: qty 20

## 2013-06-09 MED ORDER — SCOPOLAMINE 1 MG/3DAYS TD PT72
1.0000 | MEDICATED_PATCH | Freq: Once | TRANSDERMAL | Status: DC
  Administered 2013-06-09: 1.5 mg via TRANSDERMAL

## 2013-06-09 SURGICAL SUPPLY — 37 items
BAG HAMPER (MISCELLANEOUS) ×2 IMPLANT
BLADE SURG SZ11 CARB STEEL (BLADE) ×2 IMPLANT
CLOTH BEACON ORANGE TIMEOUT ST (SAFETY) ×2 IMPLANT
COVER LIGHT HANDLE STERIS (MISCELLANEOUS) ×4 IMPLANT
DECANTER SPIKE VIAL GLASS SM (MISCELLANEOUS) ×2 IMPLANT
DURAPREP 26ML APPLICATOR (WOUND CARE) ×2 IMPLANT
ELECT REM PT RETURN 9FT ADLT (ELECTROSURGICAL) ×2
ELECTRODE REM PT RTRN 9FT ADLT (ELECTROSURGICAL) ×1 IMPLANT
GLOVE BIOGEL PI IND STRL 7.0 (GLOVE) IMPLANT
GLOVE BIOGEL PI IND STRL 7.5 (GLOVE) IMPLANT
GLOVE BIOGEL PI IND STRL 8 (GLOVE) ×1 IMPLANT
GLOVE BIOGEL PI INDICATOR 7.0 (GLOVE) ×1
GLOVE BIOGEL PI INDICATOR 7.5 (GLOVE) ×1
GLOVE BIOGEL PI INDICATOR 8 (GLOVE) ×1
GLOVE ECLIPSE 7.5 STRL STRAW (GLOVE) ×2 IMPLANT
GLOVE SS BIOGEL STRL SZ 6.5 (GLOVE) IMPLANT
GLOVE SUPERSENSE BIOGEL SZ 6.5 (GLOVE) ×1
GOWN STRL REUS W/TWL LRG LVL3 (GOWN DISPOSABLE) ×5 IMPLANT
INST SET MINOR GENERAL (KITS) ×2 IMPLANT
KIT ROOM TURNOVER APOR (KITS) ×2 IMPLANT
MANIFOLD NEPTUNE II (INSTRUMENTS) ×2 IMPLANT
MESH VENTRALEX ST 1-7/10 CRC S (Mesh General) ×2 IMPLANT
NDL HYPO 25X1 1.5 SAFETY (NEEDLE) ×1 IMPLANT
NEEDLE HYPO 25X1 1.5 SAFETY (NEEDLE) IMPLANT
NS IRRIG 1000ML POUR BTL (IV SOLUTION) ×2 IMPLANT
PACK MINOR (CUSTOM PROCEDURE TRAY) ×2 IMPLANT
PAD ARMBOARD 7.5X6 YLW CONV (MISCELLANEOUS) ×2 IMPLANT
SET BASIN LINEN APH (SET/KITS/TRAYS/PACK) ×2 IMPLANT
SPONGE GAUZE 2X2 8PLY STRL LF (GAUZE/BANDAGES/DRESSINGS) ×3 IMPLANT
STAPLER VISISTAT (STAPLE) ×2 IMPLANT
SUT ETHIBOND NAB MO 7 #0 18IN (SUTURE) ×2 IMPLANT
SUT VIC AB 2-0 CT2 27 (SUTURE) ×2 IMPLANT
SUT VIC AB 3-0 SH 27 (SUTURE) ×2
SUT VIC AB 3-0 SH 27X BRD (SUTURE) ×1 IMPLANT
SUT VICRYL AB 3 0 TIES (SUTURE) IMPLANT
SYR CONTROL 10ML LL (SYRINGE) ×2 IMPLANT
TAPE CLOTH SURG 4X10 WHT LF (GAUZE/BANDAGES/DRESSINGS) ×1 IMPLANT

## 2013-06-09 NOTE — Transfer of Care (Signed)
Immediate Anesthesia Transfer of Care Note  Patient: [name]  Procedure(s) Performed: Procedure(s): UMBILICAL HERNIORRHAPHY WITH MESH (N/A) INSERTION OF MESH (N/A)  Patient Location: PACU  Anesthesia Type:General  Level of Consciousness: awake, alert  and oriented  Airway & Oxygen Therapy: Patient Spontanous Breathing and Patient connected to face mask oxygen  Post-op Assessment: Report given to PACU RN  Post vital signs: Reviewed  Complications: No apparent anesthesia complications

## 2013-06-09 NOTE — Anesthesia Procedure Notes (Signed)
Procedure Name: Intubation Date/Time: 06/09/2013 11:08 AM Performed by: Tressie Stalker E Pre-anesthesia Checklist: Patient identified, Patient being monitored, Timeout performed, Emergency Drugs available and Suction available Patient Re-evaluated:Patient Re-evaluated prior to inductionOxygen Delivery Method: Circle System Utilized Preoxygenation: Pre-oxygenation with 100% oxygen Intubation Type: IV induction, Rapid sequence and Cricoid Pressure applied Ventilation: Mask ventilation without difficulty Laryngoscope Size: Mac and 3 Grade View: Grade I Tube type: Oral Tube size: 7.0 mm Number of attempts: 1 Airway Equipment and Method: stylet Placement Confirmation: ETT inserted through vocal cords under direct vision,  positive ETCO2 and breath sounds checked- equal and bilateral Secured at: 21 cm Tube secured with: Tape Dental Injury: Teeth and Oropharynx as per pre-operative assessment

## 2013-06-09 NOTE — Op Note (Signed)
Patient:  Deanna Thompson  DOB:  09-14-1955  MRN:  [id number]   Preop Diagnosis:  Umbilical hernia  Postop Diagnosis:  Same  Procedure:  Umbilical herniorrhaphy with mesh  Surgeon:  Aviva Signs, M.D.  Anes:  General endotracheal  Indications:  Patient is a 58 year old white female who has had several visits to the emergency room for reduction of an umbilical hernia. She now presents for an umbilical herniorrhaphy with mesh. The risks and benefits of the procedure including bleeding, infection, mesh use, and the possibility of recurrence of the hernia were fully explained to the patient, who gave informed consent.  Procedure note:  The patient was placed the supine position. After induction of general endotracheal anesthesia, the abdomen was prepped and draped using usual sterile technique with DuraPrep. Surgical site confirmation was performed.  An infraumbilical incision was made down to the fascia. The hernia sac was freed away from the umbilicus. The hernia sac was excised to the fascia. The hernia sac tissue was disposed of. A small 1.5 cm defect was noted. A medium size ventralax patch was then inserted and the tabs secured to the fascia using 0 Ethibond interrupted sutures. The overlying fascia and soft tissue was then reapproximated over the mesh using 0 Ethibond interrupted sutures in a transverse fashion. The base the umbilicus was secured back to the fascia using a 2-0 Vicryl interrupted suture. The subcutaneous layer was reapproximated using 3-0 Vicryl interrupted suture. The skin was closed using staples. 0.5% Sensorcaine was instilled the surrounding wound. Betadine ointment and a dry sterile dressing were applied.  All tape and needle counts were correct at the end of the procedure. Patient was extubated in the operating room and transferred to PACU in stable condition.  Complications:  None  EBL:  Minimal  Specimen:  None

## 2013-06-09 NOTE — Anesthesia Postprocedure Evaluation (Signed)
  Anesthesia Post-op Note  Patient: Deanna Thompson  Procedure(s) Performed: Procedure(s): UMBILICAL HERNIORRHAPHY WITH MESH (N/A) INSERTION OF MESH (N/A)  Patient Location: PACU  Anesthesia Type:General  Level of Consciousness: awake, alert  and oriented  Airway and Oxygen Therapy: Patient Spontanous Breathing and Patient connected to face mask oxygen  Post-op Pain: mild  Post-op Assessment: Post-op Vital signs reviewed, Patient's Cardiovascular Status Stable, Respiratory Function Stable, Patent Airway and No signs of Nausea or vomiting  Post-op Vital Signs: Reviewed and stable  Complications: No apparent anesthesia complications

## 2013-06-09 NOTE — Discharge Instructions (Signed)
Open Hernia Repair, Care After °Refer to this sheet in the next few weeks. These instructions provide you with information on caring for yourself after your procedure. Your health care provider may also give you more specific instructions. Your treatment has been planned according to current medical practices, but problems sometimes occur. Call your health care provider if you have any problems or questions after your procedure. °WHAT TO EXPECT AFTER THE PROCEDURE °After your procedure, it is typical to have the following: °· Pain in your abdomen, especially along your incision. You will be given pain medicines to control the pain. °· Constipation. You may be given a stool softener to help prevent this. °HOME CARE INSTRUCTIONS  °· Only take over-the-counter or prescription medicines as directed by your health care provider. °· Keep the wound dry and clean. You may wash the wound gently with soap and water 48 hours after surgery. Gently blot or dab the wound dry. Do not take baths, use swimming pools, or use hot tubs for 10 days or until your health care provider approves. °· Change bandages (dressings) as directed by your health care provider. °· Continue your normal diet as directed by your health care provider. Eat plenty of fruits and vegetables to help prevent constipation. °· Drink enough fluids to keep your urine clear or pale yellow. This also helps prevent constipation. °· Do not drive until your health care provider says it is okay. °· Do not lift anything heavier than 10 pounds (4.5 kg) or play contact sports for 4 weeks or until your health care provider approves. °· Follow up with your health care provider as directed. Ask your health care provider when to make an appointment to have your stitches (sutures) or staples removed. °SEEK MEDICAL CARE IF:  °· You have increased bleeding coming from the incision site. °· You have blood in your stool. °· You have increasing pain in the wound. °· You see redness  or swelling in the wound. °· You have fluid (pus) coming from the wound. °· You have a fever. °· You notice a bad smell coming from the wound or dressing. °SEEK IMMEDIATE MEDICAL CARE IF:  °· You develop a rash. °· You have chest pain or shortness of breath. °· You feel lightheaded or feel faint. °Document Released: 10/03/2004 Document Revised: 01/04/2013 Document Reviewed: 10/26/2012 °[employer]® Patient Information ©2014 [employer]. ° ° ° ° °

## 2013-06-09 NOTE — Interval H&P Note (Signed)
History and Physical Interval Note:  06/09/2013 9:58 AM  Deanna Thompson  has presented today for surgery, with the diagnosis of umbilical hernia  The various methods of treatment have been discussed with the patient and family. After consideration of risks, benefits and other options for treatment, the patient has consented to  Procedure(s): HERNIA REPAIR UMBILICAL ADULT WITH MESH (N/A) as a surgical intervention .  The patient's history has been reviewed, patient examined, no change in status, stable for surgery.  I have reviewed the patient's chart and labs.  Questions were answered to the patient's satisfaction.     Aviva Signs A

## 2013-06-09 NOTE — Anesthesia Preprocedure Evaluation (Signed)
Anesthesia Evaluation  Patient identified by MRN, date of birth, ID band Patient awake    Reviewed: Allergy & Precautions, H&P , NPO status , Patient's Chart, lab work & pertinent test results  History of Anesthesia Complications (+) PONV and history of anesthetic complications  Airway Mallampati: II TM Distance: >3 FB     Dental  (+) Teeth Intact, Implants   Pulmonary former smoker,  breath sounds clear to auscultation        Cardiovascular hypertension, Pt. on medications + Peripheral Vascular Disease Rhythm:Regular Rate:Normal     Neuro/Psych Seizures -,  PSYCHIATRIC DISORDERS Anxiety Hx cerebral aneurysm, clipped 2006.    GI/Hepatic GERD-  Medicated and Controlled,(+) Cirrhosis -       , Hepatitis -  Endo/Other    Renal/GU      Musculoskeletal   Abdominal   Peds  Hematology   Anesthesia Other Findings   Reproductive/Obstetrics                           Anesthesia Physical Anesthesia Plan  ASA: III  Anesthesia Plan: General   Post-op Pain Management:    Induction: Intravenous, Rapid sequence and Cricoid pressure planned  Airway Management Planned: Oral ETT  Additional Equipment:   Intra-op Plan:   Post-operative Plan: Extubation in OR  Informed Consent: I have reviewed the patients History and Physical, chart, labs and discussed the procedure including the risks, benefits and alternatives for the proposed anesthesia with the patient or authorized representative who has indicated his/her understanding and acceptance.     Plan Discussed with:   Anesthesia Plan Comments:         Anesthesia Quick Evaluation

## 2013-06-12 ENCOUNTER — Encounter (HOSPITAL_COMMUNITY): Payer: Self-pay | Admitting: General Surgery

## 2013-06-20 NOTE — Progress Notes (Signed)
REVIEWED.  

## 2013-06-27 ENCOUNTER — Encounter: Payer: Self-pay | Admitting: Gastroenterology

## 2013-06-27 NOTE — Telephone Encounter (Signed)
PLEASE CALL PT. NEXT OPV E30 AUG 2015 CIRRHOSIS. NEED CBC/PT/INR/CMP/AFP/RUQ U/S 1 WEEK PRIOR TO VISIT.

## 2013-06-27 NOTE — Addendum Note (Signed)
Addended by: Danie Binder on: 06/27/2013 02:41 PM   Modules accepted: Orders

## 2013-06-27 NOTE — Assessment & Plan Note (Signed)
NEXT LABS U/S OPV AUG 2015

## 2013-06-27 NOTE — Telephone Encounter (Signed)
RUQ U/S HAS BEEN NICD IN THE COMPUTER

## 2013-06-28 ENCOUNTER — Other Ambulatory Visit: Payer: Self-pay

## 2013-06-28 DIAGNOSIS — K7460 Unspecified cirrhosis of liver: Secondary | ICD-10-CM

## 2013-06-28 NOTE — Telephone Encounter (Signed)
Pt is aware and lab orders on file for August 2015.

## 2013-07-04 ENCOUNTER — Encounter: Payer: Self-pay | Admitting: Adult Health

## 2013-07-04 ENCOUNTER — Ambulatory Visit (INDEPENDENT_AMBULATORY_CARE_PROVIDER_SITE_OTHER): Payer: BC Managed Care – PPO | Admitting: Adult Health

## 2013-07-04 VITALS — BP 120/74 | Ht 68.0 in | Wt 212.0 lb

## 2013-07-04 DIAGNOSIS — K4090 Unilateral inguinal hernia, without obstruction or gangrene, not specified as recurrent: Secondary | ICD-10-CM

## 2013-07-04 NOTE — Progress Notes (Signed)
Subjective:     Patient ID: Deanna Thompson, female   DOB: 12/24/1955, 58 y.o.   MRN: [id number]  HPI [name] is a 58 year old white female in for lump right groin has history of inguinal hernia,recently had umbilical hernia repair by Dr Deanna Thompson.Has been tender about a week.  Review of Systems See HPI Reviewed past medical,surgical, social and family history. Reviewed medications and allergies.     Objective:   Physical Exam BP 120/74  Ht 5\' 8"  (1.727 m)  Wt 212 lb (96.163 kg)  BMI 32.24 kg/m2   Has small reducible right inguinal hernia, that is tender, no redness,Dr Deanna Thompson in for co exam.  Assessment:     Right inguinal hernia    Plan:     Called and made appt with Dr Deanna Thompson for 4/14 at 9:30 am If increase in pain go to ER   Review handout on inguinal hernia

## 2013-07-04 NOTE — Patient Instructions (Signed)

## 2013-07-05 ENCOUNTER — Other Ambulatory Visit: Payer: Self-pay | Admitting: Gastroenterology

## 2013-08-03 NOTE — H&P (Signed)
  NTS SOAP Note  Vital Signs:  Vitals as of: 1/61/0960: Systolic 454: Diastolic 74: Heart Rate 81: Temp 96.17F: Height 84ft 8in: Weight 212Lbs 0 Ounces: Pain Level 4: BMI 32.23  BMI : 32.23 kg/m2  Subjective: This 58 Years 85 Months old Female presents for of a right inguinal hernia.  Has had it for some time, but is increasing in size and causing her discomfort.  Review of Symptoms:  Constitutional:unremarkable   Head:unremarkable    Eyes:unremarkable   Nose/Mouth/Throat:unremarkable Cardiovascular:  unremarkable   Respiratory:unremarkable   Gastrointestinal:  unremarkable   Genitourinary:unremarkable     Musculoskeletal:unremarkable   Skin:unremarkable Hematolgic/Lymphatic:unremarkable     Allergic/Immunologic:unremarkable     Past Medical History:    Reviewed  Past Medical History  Surgical History: cerebral hemorrhage repair 0981, umbilical herniorrhaphy with mesh 3/15 Medical Problems: cirrhosis, reflux Allergies: keflex, dilantin, omeprazole Medications: spironolactone, xifair, folic acid, lexapro, otesla   Social History:Reviewed  Social History  Preferred Language: English Race:  White Ethnicity: Not Hispanic / Latino Age: 58 Years 4 Months Marital Status:  M Alcohol: no Recreational drug(s): no   Smoking Status: Never smoker reviewed on 07/11/2013 Functional Status reviewed on 07/11/2013 ------------------------------------------------ Bathing: Normal Cooking: Normal Dressing: Normal Driving: Normal Eating: Normal Managing Meds: Normal Oral Care: Normal Shopping: Normal Toileting: Normal Transferring: Normal Walking: Normal Cognitive Status reviewed on 07/11/2013 ------------------------------------------------ Attention: Normal Decision Making: Normal Language: Normal Memory: Normal Motor: Normal Perception: Normal Problem Solving: Normal Visual and Spatial: Normal   Family History:   Reviewed  Family Health History Mother, Living; Disease of liver;  Father, Living; Chronic obstructive lung disease (COPD);     Objective Information: General:  Well appearing, well nourished in no distress. Heart:  RRR, no murmur or gallop.  Normal S1, S2.  No S3, S4.  Lungs:    CTA bilaterally, no wheezes, rhonchi, rales.  Breathing unlabored. Abdomen:Soft, NT/ND, no HSM, no masses.  Reducible right inguinal hernia present.  Assessment:Right inguinal hernia  Diagnoses: 550.90 Inguinal hernia (Unilateral inguinal hernia, without obstruction or gangrene, not specified as recurrent)  Procedures: 99213 - OFFICE OUTPATIENT VISIT 15 MINUTES    Plan:  Scheduled for right inguinal herniorrhaphy with mesh on 08/28/13.   Patient Education:Alternative treatments to surgery were discussed with patient (and family).  Risks and benefits  of procedure including bleeding, infection, the possibility of recurrence of the hernia, and mesh use were fully explained to the patient (and family) who gave informed consent. Patient/family questions were addressed.  Follow-up:Pending Surgery

## 2013-08-07 ENCOUNTER — Encounter (HOSPITAL_COMMUNITY): Payer: Self-pay | Admitting: Pharmacy Technician

## 2013-08-10 ENCOUNTER — Telehealth: Payer: Self-pay

## 2013-08-10 DIAGNOSIS — K7460 Unspecified cirrhosis of liver: Secondary | ICD-10-CM

## 2013-08-10 NOTE — Telephone Encounter (Signed)
PLEASE CALL PT. SHE MAY TRY OTC ALEVE 1 PO BID OR OTC IBUPROFEN 2 PO TID. SHE SHOULD TAKE WTH FOOD OR MILK.

## 2013-08-10 NOTE — Telephone Encounter (Signed)
Pt called and said she saw her Rheumatologist recently and he gave hear topical NSAID to try, Pennsaid. She has tried it a couple of days on her hands and it really does help.  She also said he is diagnosing her with fibromyalgia.  She would like to know, since her labs have been normal for sometime, if Dr. Nona Dell would consider letting her take OTC NSAIDS for her hip and leg pain, etc , on a very limited basis.   Please advise!

## 2013-08-10 NOTE — Telephone Encounter (Signed)
Pt returned call and was informed.  

## 2013-08-10 NOTE — Telephone Encounter (Signed)
LMOM to call.

## 2013-08-16 NOTE — Patient Instructions (Signed)
Your procedure is scheduled on:  08/28/13  Report to [facility] at 06:15 AM  Call this number if you have problems the morning of surgery: [phone]   Remember:   Do not eat food or drink liquids after midnight.   Take these medicines the morning of surgery with A SIP OF WATER: Bumex, Lexapro, Nexium and Spironolactone   Do not wear jewelry, make-up or nail polish.  Do not wear lotions, powders, or perfumes.   Do not shave 48 hours prior to surgery. Men may shave face and neck.  Do not bring valuables to the hospital.  [facility] is not responsible for any belongings or valuables.               Contacts, dentures or bridgework may not be worn into surgery.  Leave suitcase in the car. After surgery it may be brought to your room.  For patients admitted to the hospital, discharge time is determined by your treatment team.               Patients discharged the day of surgery will not be allowed to drive home.    Special Instructions: Shower using CHG 1 night before surgery and the morning before surgery.  Use special wash - you have one bottle of CHG for both showers.  You should use approximately 1/2 of the bottle for each shower.   Please read over the following fact sheets that you were given: Anesthesia Post-op Instructions and Care and Recovery After Surgery     Inguinal Hernia, Adult Muscles help keep everything in the body in its proper place. But if a weak spot in the muscles develops, something can poke through. That is called a hernia. When this happens in the lower part of the belly (abdomen), it is called an inguinal hernia. (It takes its name from a part of the body in this region called the inguinal canal.) A weak spot in the wall of muscles lets some fat or part of the small intestine bulge through. An inguinal hernia can develop at any age. Men get them more often than women. CAUSES  In adults, an inguinal hernia develops over time.  It can be triggered by:  Suddenly  straining the muscles of the lower abdomen.  Lifting heavy objects.  Straining to have a bowel movement. Difficult bowel movements (constipation) can lead to this.  Constant coughing. This may be caused by smoking or lung disease.  Being overweight.  Being pregnant.  Working at a job that requires long periods of standing or heavy lifting.  Having had an inguinal hernia before. One type can be an emergency situation. It is called a strangulated inguinal hernia. It develops if part of the small intestine slips through the weak spot and cannot get back into the abdomen. The blood supply can be cut off. If that happens, part of the intestine may die. This situation requires emergency surgery. SYMPTOMS  Often, a small inguinal hernia has no symptoms. It is found when a healthcare provider does a physical exam. Larger hernias usually have symptoms.   In adults, symptoms may include:  A lump in the groin. This is easier to see when the person is standing. It might disappear when lying down.  In men, a lump in the scrotum.  Pain or burning in the groin. This occurs especially when lifting, straining or coughing.  A dull ache or feeling of pressure in the groin.  Signs of a strangulated hernia can include:  A bulge in the groin that becomes very painful and tender to the touch.  A bulge that turns red or purple.  Fever, nausea and vomiting.  Inability to have a bowel movement or to pass gas. DIAGNOSIS  To decide if you have an inguinal hernia, a healthcare provider will probably do a physical examination.  This will include asking questions about any symptoms you have noticed.  The healthcare provider might feel the groin area and ask you to cough. If an inguinal hernia is felt, the healthcare provider may try to slide it back into the abdomen.  Usually no other tests are needed. TREATMENT  Treatments can vary. The size of the hernia makes a difference. Options  include:  Watchful waiting. This is often suggested if the hernia is small and you have had no symptoms.  No medical procedure will be done unless symptoms develop.  You will need to watch closely for symptoms. If any occur, contact your healthcare provider right away.  Surgery. This is used if the hernia is larger or you have symptoms.  Open surgery. This is usually an outpatient procedure (you will not stay overnight in a hospital). An cut (incision) is made through the skin in the groin. The hernia is put back inside the abdomen. The weak area in the muscles is then repaired by herniorrhaphy or hernioplasty. Herniorrhaphy: in this type of surgery, the weak muscles are sewn back together. Hernioplasty: a patch or mesh is used to close the weak area in the abdominal wall.  Laparoscopy. In this procedure, a surgeon makes small incisions. A thin tube with a tiny video camera (called a laparoscope) is put into the abdomen. The surgeon repairs the hernia with mesh by looking with the video camera and using two long instruments. HOME CARE INSTRUCTIONS   After surgery to repair an inguinal hernia:  You will need to take pain medicine prescribed by your healthcare provider. Follow all directions carefully.  You will need to take care of the wound from the incision.  Your activity will be restricted for awhile. This will probably include no heavy lifting for several weeks. You also should not do anything too active for a few weeks. When you can return to work will depend on the type of job that you have.  During "watchful waiting" periods, you should:  Maintain a healthy weight.  Eat a diet high in fiber (fruits, vegetables and whole grains).  Drink plenty of fluids to avoid constipation. This means drinking enough water and other liquids to keep your urine clear or pale yellow.  Do not lift heavy objects.  Do not stand for long periods of time.  Quit smoking. This should keep you from  developing a frequent cough. SEEK MEDICAL CARE IF:   A bulge develops in your groin area.  You feel pain, a burning sensation or pressure in the groin. This might be worse if you are lifting or straining.  You develop a fever of more than 100.5 F (38.1 C). SEEK IMMEDIATE MEDICAL CARE IF:   Pain in the groin increases suddenly.  A bulge in the groin gets bigger suddenly and does not go down.  For men, there is sudden pain in the scrotum. Or, the size of the scrotum increases.  A bulge in the groin area becomes red or purple and is painful to touch.  You have nausea or vomiting that does not go away.  You feel your heart beating much faster than normal.  You  cannot have a bowel movement or pass gas.  You develop a fever of more than 102.0 F (38.9 C). Document Released: 08/02/2008 Document Revised: 06/08/2011 Document Reviewed: 08/02/2008 [facility] Patient Information 2014 [place], [place].    PATIENT INSTRUCTIONS POST-ANESTHESIA  IMMEDIATELY FOLLOWING SURGERY:  Do not drive or operate machinery for the first twenty four hours after surgery.  Do not make any important decisions for twenty four hours after surgery or while taking narcotic pain medications or sedatives.  If you develop intractable nausea and vomiting or a severe headache please notify your doctor immediately.  FOLLOW-UP:  Please make an appointment with your surgeon as instructed. You do not need to follow up with anesthesia unless specifically instructed to do so.  WOUND CARE INSTRUCTIONS (if applicable):  Keep a dry clean dressing on the anesthesia/puncture wound site if there is drainage.  Once the wound has quit draining you may leave it open to air.  Generally you should leave the bandage intact for twenty four hours unless there is drainage.  If the epidural site drains for more than 36-48 hours please call the anesthesia department.  QUESTIONS?:  Please feel free to call your physician or the hospital  operator if you have any questions, and they will be happy to assist you.

## 2013-08-17 ENCOUNTER — Encounter (HOSPITAL_COMMUNITY): Payer: Self-pay

## 2013-08-17 ENCOUNTER — Encounter (HOSPITAL_COMMUNITY)
Admission: RE | Admit: 2013-08-17 | Discharge: 2013-08-17 | Disposition: A | Discharge status: Home / Self Care | Payer: BC Managed Care – PPO | Source: Ambulatory Visit | Attending: General Surgery | Admitting: General Surgery

## 2013-08-17 DIAGNOSIS — Z01812 Encounter for preprocedural laboratory examination: Secondary | ICD-10-CM | POA: Insufficient documentation

## 2013-08-17 LAB — CBC WITH DIFFERENTIAL/PLATELET
BASOS ABS: 0.1 10*3/uL (ref 0.0–0.1)
Basophils Relative: 1 % (ref 0–1)
EOS PCT: 4 % (ref 0–5)
Eosinophils Absolute: 0.3 10*3/uL (ref 0.0–0.7)
HEMATOCRIT: 31.6 % — AB (ref 36.0–46.0)
HEMOGLOBIN: 10 g/dL — AB (ref 12.0–15.0)
LYMPHS ABS: 1.5 10*3/uL (ref 0.7–4.0)
LYMPHS PCT: 21 % (ref 12–46)
MCH: 25.2 pg — ABNORMAL LOW (ref 26.0–34.0)
MCHC: 31.6 g/dL (ref 30.0–36.0)
MCV: 79.6 fL (ref 78.0–100.0)
MONO ABS: 0.7 10*3/uL (ref 0.1–1.0)
MONOS PCT: 10 % (ref 3–12)
NEUTROS ABS: 4.3 10*3/uL (ref 1.7–7.7)
Neutrophils Relative %: 64 % (ref 43–77)
Platelets: 268 10*3/uL (ref 150–400)
RBC: 3.97 MIL/uL (ref 3.87–5.11)
RDW: 15.1 % (ref 11.5–15.5)
WBC: 6.8 10*3/uL (ref 4.0–10.5)

## 2013-08-17 LAB — BASIC METABOLIC PANEL
BUN: 14 mg/dL (ref 6–23)
CHLORIDE: 101 meq/L (ref 96–112)
CO2: 30 meq/L (ref 19–32)
CREATININE: 0.98 mg/dL (ref 0.50–1.10)
Calcium: 10 mg/dL (ref 8.4–10.5)
GFR calc Af Amer: 73 mL/min — ABNORMAL LOW (ref >90)
GFR calc non Af Amer: 63 mL/min — ABNORMAL LOW (ref >90)
Glucose, Bld: 93 mg/dL (ref 70–99)
Potassium: 4.5 mEq/L (ref 3.7–5.3)
Sodium: 142 mEq/L (ref 137–147)

## 2013-08-17 MED ORDER — CHLORHEXIDINE GLUCONATE 4 % EX LIQD
1.0000 | Freq: Once | CUTANEOUS | Status: DC
Start: 2013-08-17 — End: 2013-08-18

## 2013-08-17 MED ORDER — VANCOMYCIN HCL IN DEXTROSE 1-5 GM/200ML-% IV SOLN: 1000.0000 mg | INTRAVENOUS | Status: DC

## 2013-08-28 ENCOUNTER — Encounter (HOSPITAL_COMMUNITY): Payer: Self-pay | Admitting: *Deleted

## 2013-08-28 ENCOUNTER — Ambulatory Visit (HOSPITAL_COMMUNITY): Payer: BC Managed Care – PPO | Admitting: Anesthesiology

## 2013-08-28 ENCOUNTER — Ambulatory Visit (HOSPITAL_COMMUNITY)
Admission: RE | Admit: 2013-08-28 | Discharge: 2013-08-28 | Disposition: A | Discharge status: Home / Self Care | Payer: BC Managed Care – PPO | Source: Ambulatory Visit | Attending: General Surgery | Admitting: General Surgery

## 2013-08-28 ENCOUNTER — Encounter (HOSPITAL_COMMUNITY): Payer: BC Managed Care – PPO | Admitting: Anesthesiology

## 2013-08-28 ENCOUNTER — Encounter (HOSPITAL_COMMUNITY)
Admission: RE | Disposition: A | Discharge status: Home / Self Care | Payer: Self-pay | Source: Ambulatory Visit | Attending: General Surgery

## 2013-08-28 DIAGNOSIS — K4090 Unilateral inguinal hernia, without obstruction or gangrene, not specified as recurrent: Secondary | ICD-10-CM | POA: Insufficient documentation

## 2013-08-28 DIAGNOSIS — Z79899 Other long term (current) drug therapy: Secondary | ICD-10-CM | POA: Insufficient documentation

## 2013-08-28 DIAGNOSIS — K219 Gastro-esophageal reflux disease without esophagitis: Secondary | ICD-10-CM | POA: Insufficient documentation

## 2013-08-28 DIAGNOSIS — Z881 Allergy status to other antibiotic agents status: Secondary | ICD-10-CM | POA: Insufficient documentation

## 2013-08-28 DIAGNOSIS — Z888 Allergy status to other drugs, medicaments and biological substances status: Secondary | ICD-10-CM | POA: Insufficient documentation

## 2013-08-28 DIAGNOSIS — K7460 Unspecified cirrhosis of liver: Secondary | ICD-10-CM | POA: Insufficient documentation

## 2013-08-28 HISTORY — PX: INSERTION OF MESH: SHX5868

## 2013-08-28 HISTORY — PX: INGUINAL HERNIA REPAIR: SHX194

## 2013-08-28 SURGERY — REPAIR, HERNIA, INGUINAL, ADULT
Anesthesia: General | Site: Groin | Laterality: Right

## 2013-08-28 MED ORDER — LACTATED RINGERS IV SOLN
INTRAVENOUS | Status: DC
  Administered 2013-08-28: 07:00:00 via INTRAVENOUS

## 2013-08-28 MED ORDER — BUPIVACAINE HCL (PF) 0.5 % IJ SOLN
INTRAMUSCULAR | Status: AC
  Filled 2013-08-28: qty 30

## 2013-08-28 MED ORDER — FENTANYL CITRATE 0.05 MG/ML IJ SOLN
25.0000 ug | INTRAMUSCULAR | Status: AC
  Administered 2013-08-28 (×2): 25 ug via INTRAVENOUS
  Filled 2013-08-28: qty 2

## 2013-08-28 MED ORDER — ONDANSETRON HCL 4 MG/2ML IJ SOLN: 4.0000 mg | Freq: Once | INTRAMUSCULAR | Status: DC | PRN

## 2013-08-28 MED ORDER — POVIDONE-IODINE 10 % EX OINT
TOPICAL_OINTMENT | CUTANEOUS | Status: AC
  Filled 2013-08-28: qty 1

## 2013-08-28 MED ORDER — SODIUM CHLORIDE 0.9 % IR SOLN
Status: DC | PRN
  Administered 2013-08-28: 1000 mL

## 2013-08-28 MED ORDER — SUCCINYLCHOLINE CHLORIDE 20 MG/ML IJ SOLN
INTRAMUSCULAR | Status: DC | PRN
  Administered 2013-08-28: 100 mg via INTRAVENOUS

## 2013-08-28 MED ORDER — FENTANYL CITRATE 0.05 MG/ML IJ SOLN
INTRAMUSCULAR | Status: AC
  Filled 2013-08-28: qty 5

## 2013-08-28 MED ORDER — MIDAZOLAM HCL 2 MG/2ML IJ SOLN
INTRAMUSCULAR | Status: AC
  Filled 2013-08-28: qty 2

## 2013-08-28 MED ORDER — OXYCODONE HCL 5 MG PO TABS: 5.0000 mg | ORAL_TABLET | ORAL | Status: DC | PRN

## 2013-08-28 MED ORDER — KETOROLAC TROMETHAMINE 30 MG/ML IJ SOLN
30.0000 mg | Freq: Once | INTRAMUSCULAR | Status: AC
  Administered 2013-08-28: 30 mg via INTRAVENOUS
  Filled 2013-08-28: qty 1

## 2013-08-28 MED ORDER — VANCOMYCIN HCL IN DEXTROSE 1-5 GM/200ML-% IV SOLN
INTRAVENOUS | Status: AC
  Filled 2013-08-28: qty 200

## 2013-08-28 MED ORDER — DEXAMETHASONE SODIUM PHOSPHATE 4 MG/ML IJ SOLN
INTRAMUSCULAR | Status: AC
  Filled 2013-08-28: qty 1

## 2013-08-28 MED ORDER — GLYCOPYRROLATE 0.2 MG/ML IJ SOLN
INTRAMUSCULAR | Status: AC
  Filled 2013-08-28: qty 1

## 2013-08-28 MED ORDER — MIDAZOLAM HCL 2 MG/2ML IJ SOLN
1.0000 mg | INTRAMUSCULAR | Status: DC | PRN
  Administered 2013-08-28: 2 mg via INTRAVENOUS

## 2013-08-28 MED ORDER — ROCURONIUM BROMIDE 100 MG/10ML IV SOLN
INTRAVENOUS | Status: DC | PRN
  Administered 2013-08-28: 5 mg via INTRAVENOUS

## 2013-08-28 MED ORDER — LIDOCAINE HCL (PF) 1 % IJ SOLN
INTRAMUSCULAR | Status: AC
  Filled 2013-08-28: qty 5

## 2013-08-28 MED ORDER — VANCOMYCIN HCL IN DEXTROSE 1-5 GM/200ML-% IV SOLN
1000.0000 mg | Freq: Once | INTRAVENOUS | Status: AC
  Administered 2013-08-28: 1000 mg via INTRAVENOUS

## 2013-08-28 MED ORDER — BUPIVACAINE LIPOSOME 1.3 % IJ SUSP
20.0000 mL | Freq: Once | INTRAMUSCULAR | Status: AC
  Administered 2013-08-28: 20 mL
  Filled 2013-08-28: qty 20

## 2013-08-28 MED ORDER — PROPOFOL 10 MG/ML IV BOLUS
INTRAVENOUS | Status: DC | PRN
  Administered 2013-08-28: 160 mg via INTRAVENOUS

## 2013-08-28 MED ORDER — FENTANYL CITRATE 0.05 MG/ML IJ SOLN
INTRAMUSCULAR | Status: AC
  Filled 2013-08-28: qty 2

## 2013-08-28 MED ORDER — SCOPOLAMINE 1 MG/3DAYS TD PT72
MEDICATED_PATCH | TRANSDERMAL | Status: AC
  Filled 2013-08-28: qty 1

## 2013-08-28 MED ORDER — GLYCOPYRROLATE 0.2 MG/ML IJ SOLN
0.2000 mg | Freq: Once | INTRAMUSCULAR | Status: AC
  Administered 2013-08-28: 0.2 mg via INTRAVENOUS

## 2013-08-28 MED ORDER — PROPOFOL 10 MG/ML IV EMUL
INTRAVENOUS | Status: AC
  Filled 2013-08-28: qty 20

## 2013-08-28 MED ORDER — FENTANYL CITRATE 0.05 MG/ML IJ SOLN
25.0000 ug | INTRAMUSCULAR | Status: DC | PRN
  Administered 2013-08-28 (×3): 50 ug via INTRAVENOUS

## 2013-08-28 MED ORDER — LIDOCAINE HCL 1 % IJ SOLN
INTRAMUSCULAR | Status: DC | PRN
  Administered 2013-08-28: 40 mg via INTRADERMAL

## 2013-08-28 MED ORDER — DEXAMETHASONE SODIUM PHOSPHATE 4 MG/ML IJ SOLN
4.0000 mg | Freq: Once | INTRAMUSCULAR | Status: AC
  Administered 2013-08-28: 4 mg via INTRAVENOUS

## 2013-08-28 MED ORDER — ONDANSETRON HCL 4 MG/2ML IJ SOLN
4.0000 mg | Freq: Once | INTRAMUSCULAR | Status: AC
  Administered 2013-08-28: 4 mg via INTRAVENOUS

## 2013-08-28 MED ORDER — SCOPOLAMINE 1 MG/3DAYS TD PT72
1.0000 | MEDICATED_PATCH | Freq: Once | TRANSDERMAL | Status: DC
  Administered 2013-08-28: 1.5 mg via TRANSDERMAL

## 2013-08-28 MED ORDER — ONDANSETRON HCL 4 MG/2ML IJ SOLN
INTRAMUSCULAR | Status: AC
  Filled 2013-08-28: qty 2

## 2013-08-28 MED ORDER — FENTANYL CITRATE 0.05 MG/ML IJ SOLN
INTRAMUSCULAR | Status: DC | PRN
  Administered 2013-08-28 (×3): 50 ug via INTRAVENOUS

## 2013-08-28 MED ORDER — ROCURONIUM BROMIDE 50 MG/5ML IV SOLN
INTRAVENOUS | Status: AC
  Filled 2013-08-28: qty 1

## 2013-08-28 SURGICAL SUPPLY — 44 items
ADH SKN CLS APL DERMABOND .7 (GAUZE/BANDAGES/DRESSINGS) ×1
BAG HAMPER (MISCELLANEOUS) ×2 IMPLANT
BLADE 10 SAFETY STRL DISP (BLADE) ×2 IMPLANT
CLOTH BEACON ORANGE TIMEOUT ST (SAFETY) ×2 IMPLANT
COVER LIGHT HANDLE STERIS (MISCELLANEOUS) ×4 IMPLANT
DECANTER SPIKE VIAL GLASS SM (MISCELLANEOUS) ×2 IMPLANT
DERMABOND ADVANCED (GAUZE/BANDAGES/DRESSINGS) ×1
DERMABOND ADVANCED .7 DNX12 (GAUZE/BANDAGES/DRESSINGS) ×1 IMPLANT
DRAIN PENROSE 18X1/2 LTX STRL (DRAIN) ×2 IMPLANT
ELECT REM PT RETURN 9FT ADLT (ELECTROSURGICAL) ×2
ELECTRODE REM PT RTRN 9FT ADLT (ELECTROSURGICAL) ×1 IMPLANT
FORMALIN 10 PREFIL 120ML (MISCELLANEOUS) IMPLANT
GLOVE BIOGEL PI IND STRL 7.0 (GLOVE) IMPLANT
GLOVE BIOGEL PI INDICATOR 7.0 (GLOVE) ×2
GLOVE ECLIPSE 6.5 STRL STRAW (GLOVE) ×1 IMPLANT
GLOVE SS BIOGEL STRL SZ 6.5 (GLOVE) IMPLANT
GLOVE SUPERSENSE BIOGEL SZ 6.5 (GLOVE) ×1
GLOVE SURG SS PI 7.5 STRL IVOR (GLOVE) ×2 IMPLANT
GOWN STRL REUS W/TWL LRG LVL3 (GOWN DISPOSABLE) ×6 IMPLANT
INST SET MINOR GENERAL (KITS) ×2 IMPLANT
KIT ROOM TURNOVER APOR (KITS) ×2 IMPLANT
MANIFOLD NEPTUNE II (INSTRUMENTS) ×2 IMPLANT
MESH HERNIA 1.6X1.9 PLUG LRG (Mesh General) IMPLANT
MESH HERNIA PLUG LRG (Mesh General) ×1 IMPLANT
NDL HYPO 18GX1.5 BLUNT FILL (NEEDLE) IMPLANT
NEEDLE HYPO 18GX1.5 BLUNT FILL (NEEDLE) ×2 IMPLANT
NS IRRIG 1000ML POUR BTL (IV SOLUTION) ×2 IMPLANT
PACK MINOR (CUSTOM PROCEDURE TRAY) ×2 IMPLANT
PAD ARMBOARD 7.5X6 YLW CONV (MISCELLANEOUS) ×2 IMPLANT
SET BASIN LINEN APH (SET/KITS/TRAYS/PACK) ×2 IMPLANT
SOL PREP PROV IODINE SCRUB 4OZ (MISCELLANEOUS) ×2 IMPLANT
SUT NOVA NAB GS-22 2 2-0 T-19 (SUTURE) ×2 IMPLANT
SUT NOVAFIL NAB HGS22 2-0 30IN (SUTURE) IMPLANT
SUT SILK 3 0 (SUTURE)
SUT SILK 3-0 18XBRD TIE 12 (SUTURE) IMPLANT
SUT VIC AB 2-0 CT1 27 (SUTURE) ×2
SUT VIC AB 2-0 CT1 TAPERPNT 27 (SUTURE) ×1 IMPLANT
SUT VIC AB 3-0 SH 27 (SUTURE) ×2
SUT VIC AB 3-0 SH 27X BRD (SUTURE) ×1 IMPLANT
SUT VIC AB 4-0 PS2 27 (SUTURE) ×2 IMPLANT
SUT VICRYL AB 2 0 TIES (SUTURE) ×1 IMPLANT
SUT VICRYL AB 3 0 TIES (SUTURE) IMPLANT
SYR 20CC LL (SYRINGE) ×3 IMPLANT
TOWEL OR 17X26 4PK STRL BLUE (TOWEL DISPOSABLE) ×2 IMPLANT

## 2013-08-28 NOTE — Op Note (Signed)
Patient:  Deanna Thompson  DOB:  20-May-1955  MRN:  [id number]   Preop Diagnosis:  Right inguinal hernia  Postop Diagnosis:  Same  Procedure:  Right inguinal herniorrhaphy with mesh  Surgeon:  Aviva Signs, M.D.  Anes:  General endotracheal  Indications:  Patient is a 58 year old white female presents with a symptomatic right inguinal hernia. The risks and benefits of the procedure including bleeding, infection, and recurrence of the hernia were fully explained to the patient, who gave informed consent.  Procedure note:  The patient is placed the supine position. After general anesthesia was administered, the right groin region was prepped and draped using usual sterile technique with Betadine. Surgical site confirmation was performed.  A right groin incision was made down to the external oblique aponeuroses. The aponeuroses was incised to the external ring. A Penrose drain was placed around the processes vaginalis. The patient had an indirect hernia sac. This was freed away from the fascia and inverted at the internal ring. The ilioinguinal nerve was identified retracted superiorly from the operative field. A large PerFix Bard mesh plug was then inserted. It was secured to the transversalis fascia using a 2-0 Novafil interrupted suture. An onlay patch was then placed along the floor of inguinal canal and secured superiorly to the conjoined tendon and inferiorly to the shelving edge of Poupart's ligament using 20 Novafil interrupted sutures. The external oblique aponeuroses was reapproximated using a 2-0 Vicryl running suture. Subcutaneous layer was reapproximated using 3-0 Vicryl interrupted suture. The skin was closed using a 4-0 Vicryl subcuticular suture. Exparel was instilled in the surrounding wound. Dermabond was then applied.  All tape and needle counts were correct the end of the procedure. Patient was awakened and transferred to PACU in stable condition.  Complications:  None  EBL:   Minimal  Specimen:  None

## 2013-08-28 NOTE — Anesthesia Preprocedure Evaluation (Signed)
Anesthesia Evaluation  Patient identified by MRN, date of birth, ID band Patient awake    Reviewed: Allergy & Precautions, H&P , NPO status , Patient's Chart, lab work & pertinent test results  History of Anesthesia Complications (+) PONV and history of anesthetic complications  Airway Mallampati: II TM Distance: >3 FB     Dental  (+) Teeth Intact, Implants   Pulmonary former smoker,  breath sounds clear to auscultation        Cardiovascular hypertension, Pt. on medications + Peripheral Vascular Disease Rhythm:Regular Rate:Normal     Neuro/Psych Seizures -,  PSYCHIATRIC DISORDERS Anxiety Hx cerebral aneurysm, clipped 2006.    GI/Hepatic GERD-  Medicated and Controlled,(+) Cirrhosis -    substance abuse (quit 2013)  alcohol use, Hepatitis -  Endo/Other    Renal/GU      Musculoskeletal   Abdominal   Peds  Hematology   Anesthesia Other Findings   Reproductive/Obstetrics                           Anesthesia Physical Anesthesia Plan  ASA: III  Anesthesia Plan: General   Post-op Pain Management:    Induction: Intravenous, Rapid sequence and Cricoid pressure planned  Airway Management Planned: Oral ETT  Additional Equipment:   Intra-op Plan:   Post-operative Plan: Extubation in OR  Informed Consent: I have reviewed the patients History and Physical, chart, labs and discussed the procedure including the risks, benefits and alternatives for the proposed anesthesia with the patient or authorized representative who has indicated his/her understanding and acceptance.     Plan Discussed with:   Anesthesia Plan Comments:         Anesthesia Quick Evaluation

## 2013-08-28 NOTE — Transfer of Care (Signed)
Immediate Anesthesia Transfer of Care Note  Patient: [facility]  Procedure(s) Performed: Procedure(s): RIGHT NGUINAL HERNIORRHAPHY WITH MESH (Right) INSERTION OF MESH  Patient Location: PACU  Anesthesia Type:General  Level of Consciousness: awake, alert  and oriented  Airway & Oxygen Therapy: Patient Spontanous Breathing and Patient connected to face mask oxygen  Post-op Assessment: Report given to PACU RN  Post vital signs: Reviewed and stable  Complications: No apparent anesthesia complications

## 2013-08-28 NOTE — Discharge Instructions (Signed)
Inguinal Hernia, Adult  °Care After °Refer to this sheet in the next few weeks. These discharge instructions provide you with general information on caring for yourself after you leave the hospital. Your caregiver may also give you specific instructions. Your treatment has been planned according to the most current medical practices available, but unavoidable complications sometimes occur. If you have any problems or questions after discharge, please call your caregiver. °HOME CARE INSTRUCTIONS °· Put ice on the operative site. °· Put ice in a plastic bag. °· Place a towel between your skin and the bag. °· Leave the ice on for 15-20 minutes at a time, 03-04 times a day while awake. °· Change bandages (dressings) as directed. °· Keep the wound dry and clean. The wound may be washed gently with soap and water. Gently blot or dab the wound dry. It is okay to take showers 24 to 48 hours after surgery. Do not take baths, use swimming pools, or use hot tubs for 10 days, or as directed by your caregiver. °· Only take over-the-counter or prescription medicines for pain, discomfort, or fever as directed by your caregiver. °· Continue your normal diet as directed. °· Do not lift anything more than 10 pounds or play contact sports for 3 weeks, or as directed. °SEEK MEDICAL CARE IF: °· There is redness, swelling, or increasing pain in the wound. °· There is fluid (pus) coming from the wound. °· There is drainage from a wound lasting longer than 1 day. °· You have an oral temperature above 102° F (38.9° C). °· You notice a bad smell coming from the wound or dressing. °· The wound breaks open after the stitches (sutures) have been removed. °· You notice increasing pain in the shoulders (shoulder strap areas). °· You develop dizzy episodes or fainting while standing. °· You feel sick to your stomach (nauseous) or throw up (vomit). °SEEK IMMEDIATE MEDICAL CARE IF: °· You develop a rash. °· You have difficulty breathing. °· You  develop a reaction or have side effects to medicines you were given. °MAKE SURE YOU:  °· Understand these instructions. °· Will watch your condition. °· Will get help right away if you are not doing well or get worse. °Document Released: 04/16/2006 Document Revised: 06/08/2011 Document Reviewed: 02/13/2009 °[employer]® Patient Information ©2014 [employer]. ° °

## 2013-08-28 NOTE — Anesthesia Procedure Notes (Signed)
Procedure Name: Intubation Date/Time: 08/28/2013 7:43 AM Performed by: Tressie Stalker E Pre-anesthesia Checklist: Patient identified, Patient being monitored, Timeout performed, Emergency Drugs available and Suction available Patient Re-evaluated:Patient Re-evaluated prior to inductionOxygen Delivery Method: Circle System Utilized Preoxygenation: Pre-oxygenation with 100% oxygen Intubation Type: IV induction, Rapid sequence and Cricoid Pressure applied Ventilation: Mask ventilation without difficulty Laryngoscope Size: Mac and 3 Grade View: Grade I Tube type: Oral Tube size: 7.0 mm Number of attempts: 1 Airway Equipment and Method: stylet Placement Confirmation: ETT inserted through vocal cords under direct vision,  positive ETCO2 and breath sounds checked- equal and bilateral Secured at: 21 cm Tube secured with: Tape Dental Injury: Teeth and Oropharynx as per pre-operative assessment

## 2013-08-28 NOTE — Interval H&P Note (Signed)
History and Physical Interval Note:  08/28/2013 7:31 AM  [facility]  has presented today for surgery, with the diagnosis of right inguinal hernia  The various methods of treatment have been discussed with the patient and family. After consideration of risks, benefits and other options for treatment, the patient has consented to  Procedure(s): RIGHT NGUINAL HERNIORRHAPHY WITH MESH (Right) as a surgical intervention .  The patient's history has been reviewed, patient examined, no change in status, stable for surgery.  I have reviewed the patient's chart and labs.  Questions were answered to the patient's satisfaction.     Jamesetta So

## 2013-08-28 NOTE — Anesthesia Postprocedure Evaluation (Signed)
  Anesthesia Post-op Note  Patient: Deanna Thompson  Procedure(s) Performed: Procedure(s): RIGHT NGUINAL HERNIORRHAPHY WITH MESH (Right) INSERTION OF MESH  Patient Location: PACU  Anesthesia Type:General  Level of Consciousness: awake, alert  and oriented  Airway and Oxygen Therapy: Patient Spontanous Breathing and Patient connected to face mask oxygen  Post-op Pain: none  Post-op Assessment: Post-op Vital signs reviewed, Patient's Cardiovascular Status Stable, Respiratory Function Stable, Patent Airway and No signs of Nausea or vomiting  Post-op Vital Signs: Reviewed and stable  Last Vitals:  Filed Vitals:   08/28/13 0639  BP: 124/73  Pulse: 70  Temp: 37.2 C  Resp: 18    Complications: No apparent anesthesia complications

## 2013-08-29 ENCOUNTER — Encounter (HOSPITAL_COMMUNITY): Payer: Self-pay | Admitting: General Surgery

## 2013-10-31 ENCOUNTER — Telehealth: Payer: Self-pay | Admitting: Gastroenterology

## 2013-10-31 ENCOUNTER — Encounter: Payer: Self-pay | Admitting: Gastroenterology

## 2013-10-31 DIAGNOSIS — K7030 Alcoholic cirrhosis of liver without ascites: Secondary | ICD-10-CM

## 2013-10-31 NOTE — Telephone Encounter (Signed)
Letter has been mailed to the patient 

## 2013-10-31 NOTE — Telephone Encounter (Signed)
Pt is on the AUG recall for RUQ U/S  f/u cirrhosis

## 2013-11-03 ENCOUNTER — Other Ambulatory Visit: Payer: Self-pay | Admitting: Gastroenterology

## 2013-11-03 DIAGNOSIS — K7460 Unspecified cirrhosis of liver: Secondary | ICD-10-CM

## 2013-11-06 ENCOUNTER — Other Ambulatory Visit: Payer: Self-pay | Admitting: Gastroenterology

## 2013-11-06 NOTE — Telephone Encounter (Signed)
She take it BID

## 2013-11-06 NOTE — Telephone Encounter (Signed)
Please clarify dosing for aldactone. We had in our records BID only.

## 2013-11-09 ENCOUNTER — Ambulatory Visit (HOSPITAL_COMMUNITY)
Admission: RE | Admit: 2013-11-09 | Discharge: 2013-11-09 | Disposition: A | Discharge status: Home / Self Care | Payer: BC Managed Care – PPO | Source: Ambulatory Visit | Attending: Gastroenterology | Admitting: Gastroenterology

## 2013-11-09 DIAGNOSIS — K7460 Unspecified cirrhosis of liver: Secondary | ICD-10-CM | POA: Diagnosis present

## 2013-11-09 DIAGNOSIS — R161 Splenomegaly, not elsewhere classified: Secondary | ICD-10-CM | POA: Insufficient documentation

## 2013-11-09 DIAGNOSIS — K8020 Calculus of gallbladder without cholecystitis without obstruction: Secondary | ICD-10-CM | POA: Insufficient documentation

## 2013-11-09 LAB — PROTIME-INR
INR: 1.23 (ref <1.50)
PROTHROMBIN TIME: 15.5 s — AB (ref 11.6–15.2)

## 2013-11-09 LAB — CBC WITH DIFFERENTIAL/PLATELET
BASOS ABS: 0.1 10*3/uL (ref 0.0–0.1)
Basophils Relative: 1 % (ref 0–1)
EOS ABS: 0.3 10*3/uL (ref 0.0–0.7)
EOS PCT: 4 % (ref 0–5)
HCT: 29.5 % — ABNORMAL LOW (ref 36.0–46.0)
Hemoglobin: 9.7 g/dL — ABNORMAL LOW (ref 12.0–15.0)
Lymphocytes Relative: 17 % (ref 12–46)
Lymphs Abs: 1.3 10*3/uL (ref 0.7–4.0)
MCH: 23.2 pg — AB (ref 26.0–34.0)
MCHC: 32.9 g/dL (ref 30.0–36.0)
MCV: 70.6 fL — AB (ref 78.0–100.0)
Monocytes Absolute: 0.8 10*3/uL (ref 0.1–1.0)
Monocytes Relative: 10 % (ref 3–12)
Neutro Abs: 5.2 10*3/uL (ref 1.7–7.7)
Neutrophils Relative %: 68 % (ref 43–77)
PLATELETS: 285 10*3/uL (ref 150–400)
RBC: 4.18 MIL/uL (ref 3.87–5.11)
RDW: 16.3 % — AB (ref 11.5–15.5)
WBC: 7.6 10*3/uL (ref 4.0–10.5)

## 2013-11-09 LAB — COMPREHENSIVE METABOLIC PANEL
ALT: 14 U/L (ref 0–35)
AST: 21 U/L (ref 0–37)
Albumin: 4 g/dL (ref 3.5–5.2)
Alkaline Phosphatase: 113 U/L (ref 39–117)
BILIRUBIN TOTAL: 0.5 mg/dL (ref 0.2–1.2)
BUN: 15 mg/dL (ref 6–23)
CO2: 29 meq/L (ref 19–32)
Calcium: 9.5 mg/dL (ref 8.4–10.5)
Chloride: 104 mEq/L (ref 96–112)
Creat: 0.98 mg/dL (ref 0.50–1.10)
Glucose, Bld: 115 mg/dL — ABNORMAL HIGH (ref 70–99)
Potassium: 4.1 mEq/L (ref 3.5–5.3)
SODIUM: 141 meq/L (ref 135–145)
TOTAL PROTEIN: 7.3 g/dL (ref 6.0–8.3)

## 2013-11-10 LAB — AFP TUMOR MARKER: AFP-Tumor Marker: 3.1 ng/mL (ref 0.0–8.0)

## 2013-11-18 NOTE — Telephone Encounter (Addendum)
Called patient TO DISCUSS RESULTS. HFP NL. U/S SHOWS CIRRHOSIS. NEXT OPV SEP 2015 E30 CIRRHOSIS, ANEMIA. REPEAT LABS/U/S IN [facility] 2015.

## 2013-11-20 ENCOUNTER — Other Ambulatory Visit: Payer: Self-pay

## 2013-11-20 DIAGNOSIS — K7031 Alcoholic cirrhosis of liver with ascites: Secondary | ICD-10-CM

## 2013-11-20 NOTE — Telephone Encounter (Signed)
Lab orders on file for 05/2014.

## 2013-11-21 NOTE — Telephone Encounter (Signed)
Pt is aware of OV on 9/24 at 3 with SF and reminder in epic to repeat labs and U/S

## 2013-11-30 ENCOUNTER — Other Ambulatory Visit: Payer: Self-pay | Admitting: Gastroenterology

## 2013-12-07 ENCOUNTER — Telehealth: Payer: Self-pay

## 2013-12-07 NOTE — Telephone Encounter (Signed)
PLEASE CALL PT. HER LIVER ENZYMES ARE NL. IT SHOULD BE OK FOR HER LIVER.

## 2013-12-07 NOTE — Telephone Encounter (Signed)
Pt called and said her psychiatrist is considering adding Wellbutrin to her Lexapro for her depression. She wants to know if Dr. Oneida Alar thinks that will be OK for her liver. Please advise!

## 2013-12-07 NOTE — Telephone Encounter (Signed)
Called and informed pt.  

## 2013-12-21 ENCOUNTER — Encounter: Payer: Self-pay | Admitting: Gastroenterology

## 2013-12-21 ENCOUNTER — Ambulatory Visit (INDEPENDENT_AMBULATORY_CARE_PROVIDER_SITE_OTHER): Payer: BC Managed Care – PPO | Admitting: Gastroenterology

## 2013-12-21 VITALS — BP 126/75 | HR 90 | Temp 97.2°F | Ht 68.0 in | Wt 242.8 lb

## 2013-12-21 DIAGNOSIS — K7030 Alcoholic cirrhosis of liver without ascites: Secondary | ICD-10-CM

## 2013-12-21 MED ORDER — BUPROPION HCL ER (SR) 150 MG PO TB12: ORAL_TABLET | ORAL | Status: DC

## 2013-12-21 NOTE — Progress Notes (Signed)
Subjective:    Patient ID: Deanna Thompson, female    DOB: April 27, 1955, 58 y.o.   MRN: [id number]  Rubbie Battiest, MD  HPI THINKING OF STARTING WELLBUTRIN. CONCERNED ABOUT WEIGHT. FEELS GOOD AT 210-220 LBS. GOT HER FLU SHOT TODAY. WANTS A TATTOO. STILL FEELING CONSTIPATED. BMs:  BROKEN UP #2. MAY GO EVERY 1-3 DAYS. FEELS LIKE SHE DOES NOT EMPTY ALL THE STOOL. CAN FIX IT WITH MIRALAX ONCE A DAY EVERY WEEK. FARTS all the time. TAKE TUMS AFTER SHE EATS IF NEEDED. [place].   PT DENIES FEVER, CHILLS, HEMATOCHEZIA, HEMATEMESIS, nausea, vomiting, melena, diarrhea, CHEST PAIN, SHORTNESS OF BREATH,  CHANGE IN BOWEL IN HABITS, abdominal pain, problems swallowing, OR heartburn or indigestion.   Past Medical History  Diagnosis Date  . IBS (irritable bowel syndrome)   . GERD (gastroesophageal reflux disease)   . Panic attack   . Anxiety   . Psoriatic arthritis   . ETOH abuse     Quit in 07/2011   . Sjogren's disease   . Cirrhosis     Presumably alcoholic cirrhosis  . Complication of anesthesia   . PONV (postoperative nausea and vomiting)   . Abdominal hernia   . 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 Seizures 1997    1 seizure from coil insertion but no more seizures and no meds for seizures  . HTN (hypertension)     no longer medicated, dated on 04/25/12  . Fatigue     due to Sjogren's disease  . Arthritis     back  and down flank  right side more than left   Past Surgical History  Procedure Laterality Date  . Esophageal biopsy  10/13/2011    SLF: mild gastritis/duodenal polypoid lesion   . Colonoscopy  10/13/2011    MEQ:[id number] hemorrhoids/varices rectal/lesion in ascending colon(HYPERPLASTIC POLYP)  . Paracentesis  Feb 2014    10.7 liters  . Colonoscopy N/A 01/17/2013    Procedure: COLONOSCOPY;  Surgeon: Danie Binder, MD;  Location: [facility];  Service: Endoscopy;  Laterality: N/A;  9:30-moved to 12:30 Darius Bump to notify pt  .  Cerebral aneurysm repair  1997  . Umbilical hernia repair N/A 06/09/2013    Procedure: UMBILICAL HERNIORRHAPHY WITH MESH;  Surgeon: Jamesetta So, MD;  Location: [facility];  Service: General;  Laterality: N/A;  . Insertion of mesh N/A 06/09/2013    Procedure: INSERTION OF MESH;  Surgeon: Jamesetta So, MD;  Location: [facility];  Service: General;  Laterality: N/A;  . Inguinal hernia repair Right 08/28/2013    Procedure: RIGHT NGUINAL HERNIORRHAPHY WITH MESH;  Surgeon: Jamesetta So, MD;  Location: [facility];  Service: General;  Laterality: Right;  . Insertion of mesh Right 08/28/2013    Procedure: INSERTION OF MESH;  Surgeon: Jamesetta So, MD;  Location: [facility];  Service: General;  Laterality: Right;   Allergies  Allergen Reactions  . Omeprazole Shortness Of Breath  . Other Shortness Of Breath and Rash    Red Meat Causes shortness of breath, rash and flu like symptoms.   . Phenytoin Rash  . Keflex [Cephalexin] Other (See Comments)    Severe yeast infection   Current Outpatient Prescriptions  Medication Sig Dispense Refill  . bumetanide (BUMEX) 1 MG tablet bid      . carboxymethylcellulose (REFRESH PLUS) 0.5 % SOLN Place 2 drops into both eyes 3 (three) times daily as needed (Dry Eyes).       Marland Kitchen  escitalopram (LEXAPRO) 20 MG tablet Take 20 mg by mouth at bedtime.     . folic acid (FOLVITE) 1 MG tablet TAKE (1) TABLET BY MOUTH EACH MORNING.    Marland Kitchen loratadine (CLARITIN) 10 MG tablet Take 10 mg by mouth daily.    Marland Kitchen NEXIUM 40 MG capsule TAKE 1 CAPSULE DAILY BEFORE BREAKFAST    . Simethicone (GAS-X EXTRA STRENGTH) 125 MG CAPS Take 1 capsule by mouth daily as needed (Gas).     . sodium chloride (OCEAN) 0.65 % nasal spray Place 1 spray into the nose as needed for congestion.    Marland Kitchen spironolactone (ALDACTONE) 100 MG tablet Take 1 tablet (100 mg total) by mouth 2 (two) times daily.    Marland Kitchen XIFAXAN 550 MG TABS tablet TAKE (1) TABLET BY MOUTH TWICE DAILY.    Marland Kitchen Cholecalciferol (VITAMIN D-3) 5000 UNITS TABS Take 5,000  mg by mouth daily.    Marland Kitchen oxyCODONE (ROXICODONE) 5 MG immediate release tablet Take 1 tablet (5 mg total) by mouth every 4 (four) hours as needed for severe pain. NOT TAKING     Review of Systems     Objective:   Physical Exam  Vitals reviewed. Constitutional: She is oriented to person, place, and time. She appears well-developed and well-nourished. No distress.  HENT:  Head: Normocephalic and atraumatic.  Mouth/Throat: Oropharynx is clear and moist. No oropharyngeal exudate.  Eyes: Pupils are equal, round, and reactive to light. No scleral icterus.  Neck: Normal range of motion. Neck supple.  Cardiovascular: Normal rate, regular rhythm and normal heart sounds.   Pulmonary/Chest: Effort normal and breath sounds normal. No respiratory distress.  Abdominal: Soft. Bowel sounds are normal. She exhibits no distension. There is no tenderness.  Musculoskeletal: She exhibits no edema.  Lymphadenopathy:    She has no cervical adenopathy.  Neurological: She is alert and oriented to person, place, and time.  NO FOCAL DEFICITS   Psychiatric:  SLIGHTLY DEPRESSED MOOD, NL AFFECT          Assessment & Plan:

## 2013-12-21 NOTE — Progress Notes (Signed)
PATIENT NIC'D  °

## 2013-12-21 NOTE — Progress Notes (Signed)
cc'ed to pcp °

## 2013-12-21 NOTE — Patient Instructions (Signed)
LABS AND ULTRASOUND IN FEB 2016.  START WELLBUTRIN ONE AT BEDTIME FOR 4 DAYS THEN ONE  TWICE DAILY.  CONTINUE LEXAPRO.  ESTABLISH CARE WITH DR. Lance Sell [facility].  SEE YOU IN [facility] 2016.

## 2013-12-21 NOTE — Assessment & Plan Note (Signed)
WELL COMPENSATED DISEASE. FEEL DEPRESSED.  LABS/U/S Q6 MOS START WELLBUTRIN. MED STARTED AND REFILLS CAN BE CONTINUED WHEN SHE ESATBLISHES CARE WITH HER PCP. FOLLOW UP IN 6 MOS. MERRY CHRISTMAS AND HAPPY NEW YEAR!

## 2013-12-25 ENCOUNTER — Encounter: Payer: Self-pay | Admitting: Family Medicine

## 2013-12-25 ENCOUNTER — Ambulatory Visit (INDEPENDENT_AMBULATORY_CARE_PROVIDER_SITE_OTHER): Payer: BC Managed Care – PPO | Admitting: Family Medicine

## 2013-12-25 VITALS — BP 122/76 | Ht 68.0 in | Wt 241.0 lb

## 2013-12-25 DIAGNOSIS — K7460 Unspecified cirrhosis of liver: Secondary | ICD-10-CM

## 2013-12-25 DIAGNOSIS — K7469 Other cirrhosis of liver: Secondary | ICD-10-CM

## 2013-12-25 MED ORDER — BUPROPION HCL ER (SR) 150 MG PO TB12: ORAL_TABLET | ORAL | Status: DC

## 2013-12-25 MED ORDER — ESCITALOPRAM OXALATE 20 MG PO TABS: 20.0000 mg | ORAL_TABLET | Freq: Every day | ORAL | Status: DC

## 2013-12-25 NOTE — Progress Notes (Signed)
   Subjective:    Patient ID: Deanna Thompson, female    DOB: 11/05/1955, 58 y.o.   MRN: [id number]  HPI Patient prefers to be called Deanna Thompson Deanna Thompson is here today for depression.  Hx of cerebral aneursysm and   Since then chronic depr  doen snot want to take sedatives  Down thru the years has  Got into drinking heavily at one point  liv zymes started rising, u s rev chirrohsis  Saw dr fields, Ph had lifethreatening  Still has swalloiwing deficit and dysphagia and short term memory disorder  Right leg weak at times  Stress worsens symptoms She is looking for a family medical doctor. And would like to be seen by the practice under Dr Richardson Landry.  Considered liver transplant,m but then improved after  No recent suicidal thoughts  Hx of one seizure in the past Not sober for yrs  Dx of sjogren's syndrome, and dx'ed   dx'ed with fibromyalgia  Arthritis and tendonitis and bursitis  Last liv test and US shows sig improvement of cirrhosis Review of Systems No headache no chest pain no back pain no abdominal pain no change in bowel habits no blood in stool ROS otherwise negative.    Objective:   Physical Exam Alert talkative no acute distress. HEENT normal. Lungs clear. Heart rare in rhythm. Ankles without edema. Neuro exam intact.       Assessment & Plan:  Impression 1 insomnia discussed. #2 cirrhosis discuss. #3 depression with anxiety discussed long-standing since patient's life-threatening intracranial hemorrhage years ago. Plan Wellbutrin was initiated [place]. Patient has had just one seizure but associated with immediate hospitalization. None since. Would still like the medication as an adjunct. Exercise encourage. 30 minutes spent most in discussion. Recheck in 6 weeks. We will take over the Lexapro and Wellbutrin. WSL

## 2014-01-25 ENCOUNTER — Other Ambulatory Visit: Payer: Self-pay | Admitting: Gastroenterology

## 2014-02-05 ENCOUNTER — Ambulatory Visit: Payer: BC Managed Care – PPO | Admitting: Family Medicine

## 2014-02-13 ENCOUNTER — Encounter: Payer: Self-pay | Admitting: Family Medicine

## 2014-02-13 ENCOUNTER — Ambulatory Visit (INDEPENDENT_AMBULATORY_CARE_PROVIDER_SITE_OTHER): Payer: BC Managed Care – PPO | Admitting: Family Medicine

## 2014-02-13 VITALS — BP 132/74 | Temp 99.0°F | Ht 68.0 in | Wt 238.0 lb

## 2014-02-13 DIAGNOSIS — K7030 Alcoholic cirrhosis of liver without ascites: Secondary | ICD-10-CM

## 2014-02-13 DIAGNOSIS — F329 Major depressive disorder, single episode, unspecified: Secondary | ICD-10-CM

## 2014-02-13 DIAGNOSIS — F32A Depression, unspecified: Secondary | ICD-10-CM

## 2014-02-13 MED ORDER — BUPROPION HCL ER (XL) 150 MG PO TB24: 150.0000 mg | ORAL_TABLET | Freq: Every day | ORAL | Status: DC

## 2014-02-13 NOTE — Progress Notes (Signed)
   Subjective:    Patient ID: Deanna Thompson, female    DOB: 1956-02-11, 58 y.o.   MRN: [id number]  HPIFollow up on depression. Was started on wellbutrin at last visit. Since starting med she has been hyper.   Left ear pain, itching, and swelling. Started last Friday. Has had ear fungus in the past. Has appt with ENT tomorrow.   Hx of psoriatic arthritis  Developed recurrent fungal infxn in the ear  Has also felt dizzy and undsteady with head fullness  Ext ear discomfort   Overall wellbutr has helped as far as the depr, the patient feels that 300 mg is causing too many side effects.  Pt had already advised to take wellbutrin before here,  Has definitely helped "thought process"  The not so good news is the med causes hperness to  Ome out more, feels like anxiety at times, but something more than that too   Review of Systems No headache no chest pain no back pain no abdominal pain no change in bowel habits no blood in stool    Objective:   Physical Exam  Alert no apparent distress. Very talkative. Vitals stable. HEENT normal. Lungs clear heart regular rate and rhythm. Ankles without edema. Left ear slightly irritated.     Assessment & Plan:  Impression #1 reflux stable discussed #2 depression improved but too many side effects with higher dose Wellbutrin discussed #3 cirrhosis liver plan decrease Wellbutrin to 150 mg daily change to XL formulation. Diet exercise discussed. 25 minutes spent most in discussion

## 2014-02-18 DIAGNOSIS — F329 Major depressive disorder, single episode, unspecified: Secondary | ICD-10-CM | POA: Insufficient documentation

## 2014-03-13 ENCOUNTER — Ambulatory Visit: Payer: BC Managed Care – PPO | Admitting: Family Medicine

## 2014-03-15 ENCOUNTER — Encounter: Payer: Self-pay | Admitting: Family Medicine

## 2014-03-15 ENCOUNTER — Ambulatory Visit (INDEPENDENT_AMBULATORY_CARE_PROVIDER_SITE_OTHER): Payer: BC Managed Care – PPO | Admitting: Family Medicine

## 2014-03-15 VITALS — BP 128/74 | Ht 67.5 in | Wt 239.0 lb

## 2014-03-15 DIAGNOSIS — H6092 Unspecified otitis externa, left ear: Secondary | ICD-10-CM

## 2014-03-15 DIAGNOSIS — F32A Depression, unspecified: Secondary | ICD-10-CM

## 2014-03-15 DIAGNOSIS — F329 Major depressive disorder, single episode, unspecified: Secondary | ICD-10-CM

## 2014-03-15 DIAGNOSIS — G4700 Insomnia, unspecified: Secondary | ICD-10-CM

## 2014-03-15 NOTE — Progress Notes (Signed)
   Subjective:    Patient ID: Deanna Thompson, female    DOB: 11-13-1955, 58 y.o.   MRN: [id number]  HPIMed check up. Takes meds every day. No concerns.   Discuss ENT visit.  Ears were infected, often goes to, fungal infxn, vacuum the debris out  Using topical rx plus powder prn,  otc jock itch frops uses prn,  No sig dizziness when admin butt an odd feeling   Switching to the 150 xl 24 hr, has less s e's and tolerating better  Less symptoms as far as sidd effects  Bad aspects of mood a little more sig but pt appreciates less se's   Patient notes her sleep overall has improved considerably.  Continues to follow a specialist for her cirrhosis Review of Systems No headache no chest pain no back pain abdominal pain no change    Objective:   Physical Exam Alert no apparent distress vital stable HET normal lungs clear heart regular rhythm tympanic membrane improved  External ear canals improved     Assessment & Plan:  Impression 1 excellent otitis improved #2 insomnia. #3 depression handling meds well plan diet exercise discussed. Warning signs discussed. WSL

## 2014-03-17 DIAGNOSIS — G4700 Insomnia, unspecified: Secondary | ICD-10-CM | POA: Insufficient documentation

## 2014-03-19 ENCOUNTER — Other Ambulatory Visit: Payer: Self-pay | Admitting: Gastroenterology

## 2014-03-29 ENCOUNTER — Telehealth: Payer: Self-pay | Admitting: Family Medicine

## 2014-03-29 NOTE — Telephone Encounter (Signed)
Patient has a question about her Wellbutrin.  She wants to know if she can take this in the morning vs. At night because she is having trouble sleeping.

## 2014-03-29 NOTE — Telephone Encounter (Signed)
Ntsw. yes

## 2014-03-29 NOTE — Telephone Encounter (Signed)
Left message on voicemail notifying patient  

## 2014-04-18 ENCOUNTER — Telehealth: Payer: Self-pay

## 2014-04-18 NOTE — Telephone Encounter (Signed)
T/C from [place]aina @ [employer] asking if it is OK for the patient to take Elavil one qhs for a month for some facial pain. We can call back at [phone] and let them know.

## 2014-04-18 NOTE — Telephone Encounter (Signed)
CALL ORAL SURGEON. LVM-PT MAY USE ELAVIL. HER LFTS AND PLT CT WERE NORMAL IN AUG 2015. THEY MAY PAGE ME WITH QUESTIONS AT [phone].

## 2014-04-19 ENCOUNTER — Telehealth: Payer: Self-pay | Admitting: Family Medicine

## 2014-04-19 NOTE — Telephone Encounter (Signed)
plz spk with pt and get more hx (what kind of spec is giving it and what for etc.), let her know can take elavil with other meds

## 2014-04-19 NOTE — Telephone Encounter (Signed)
An oral surgeon recommending her to take Elavil one po daily qhs for myofascial pain syndrome.   I told her Dr. Richardson Landry said that it was ok to take with her other meds. Pt verbalized understanding.

## 2014-04-19 NOTE — Telephone Encounter (Signed)
Pt called stating that Dr. Buelah Manis is wanting her to take elavil and the  Pt is wanting to know if there will be any complications with her other  meds that she is currently taking.

## 2014-04-27 ENCOUNTER — Telehealth: Payer: Self-pay | Admitting: Gastroenterology

## 2014-04-27 NOTE — Telephone Encounter (Signed)
PATIENT ON February RECALL LIST FOR LABS AND ULTRASOUND

## 2014-04-30 ENCOUNTER — Other Ambulatory Visit: Payer: Self-pay

## 2014-04-30 DIAGNOSIS — K7031 Alcoholic cirrhosis of liver with ascites: Secondary | ICD-10-CM

## 2014-04-30 NOTE — Telephone Encounter (Signed)
Letter mailed with appointment for US(05/17/14 @ 9:00)

## 2014-04-30 NOTE — Telephone Encounter (Signed)
Pt lab orders were due by 05/29/2014. I have mailed those to her to do a week prior to office visit appt.

## 2014-05-03 ENCOUNTER — Telehealth: Payer: Self-pay | Admitting: Gastroenterology

## 2014-05-03 NOTE — Telephone Encounter (Signed)
Pt called to reschedule her U/S that is scheduled for 2/18. She will be out of town and said the first of March or after would be better and she also has questions about having labs done. Please call her back at [phone] if no answer LMOM.

## 2014-05-03 NOTE — Telephone Encounter (Signed)
Pt is rescheduled for 05/30/14 @ 9:00 am. [facility] with new date and time

## 2014-05-04 NOTE — Telephone Encounter (Signed)
LMOM for pt to call with questions, but lab orders were mailed to her on 04/30/2014.

## 2014-05-17 ENCOUNTER — Other Ambulatory Visit (HOSPITAL_COMMUNITY): Payer: Self-pay

## 2014-05-21 ENCOUNTER — Other Ambulatory Visit: Payer: Self-pay | Admitting: Gastroenterology

## 2014-05-22 NOTE — Telephone Encounter (Signed)
Please ask patient what dose she is on. We have had her on 2 or 3 at different times.

## 2014-05-22 NOTE — Telephone Encounter (Signed)
LMOM to call.

## 2014-05-23 NOTE — Telephone Encounter (Signed)
Pt called back and said she is on 1 mg tablet bid.

## 2014-05-23 NOTE — Telephone Encounter (Signed)
Called pt and she will check her bottle when she gets home and call us back.

## 2014-05-25 NOTE — Telephone Encounter (Signed)
Rx sent by Neil Crouch, PA on 05/23/2014.

## 2014-05-30 ENCOUNTER — Ambulatory Visit (HOSPITAL_COMMUNITY)
Admission: RE | Admit: 2014-05-30 | Discharge: 2014-05-30 | Disposition: A | Discharge status: Home / Self Care | Payer: BLUE CROSS/BLUE SHIELD | Source: Ambulatory Visit | Attending: Gastroenterology | Admitting: Gastroenterology

## 2014-05-30 DIAGNOSIS — K7460 Unspecified cirrhosis of liver: Secondary | ICD-10-CM

## 2014-06-02 LAB — CBC WITH DIFFERENTIAL/PLATELET
BASOS PCT: 1 % (ref 0–1)
Basophils Absolute: 0.1 10*3/uL (ref 0.0–0.1)
EOS PCT: 4 % (ref 0–5)
Eosinophils Absolute: 0.3 10*3/uL (ref 0.0–0.7)
HEMATOCRIT: 26.8 % — AB (ref 36.0–46.0)
Hemoglobin: 7.8 g/dL — ABNORMAL LOW (ref 12.0–15.0)
Lymphocytes Relative: 20 % (ref 12–46)
Lymphs Abs: 1.3 10*3/uL (ref 0.7–4.0)
MCH: 19.2 pg — ABNORMAL LOW (ref 26.0–34.0)
MCHC: 29.1 g/dL — ABNORMAL LOW (ref 30.0–36.0)
MCV: 65.8 fL — ABNORMAL LOW (ref 78.0–100.0)
Monocytes Absolute: 0.6 10*3/uL (ref 0.1–1.0)
Monocytes Relative: 9 % (ref 3–12)
NEUTROS PCT: 66 % (ref 43–77)
Neutro Abs: 4.4 10*3/uL (ref 1.7–7.7)
PLATELETS: 304 10*3/uL (ref 150–400)
RBC: 4.07 MIL/uL (ref 3.87–5.11)
RDW: 17.7 % — ABNORMAL HIGH (ref 11.5–15.5)
WBC: 6.6 10*3/uL (ref 4.0–10.5)

## 2014-06-02 LAB — COMPREHENSIVE METABOLIC PANEL
ALBUMIN: 4.3 g/dL (ref 3.5–5.2)
ALK PHOS: 105 U/L (ref 39–117)
ALT: 10 U/L (ref 0–35)
AST: 17 U/L (ref 0–37)
BUN: 15 mg/dL (ref 6–23)
CALCIUM: 9.2 mg/dL (ref 8.4–10.5)
CO2: 26 mEq/L (ref 19–32)
CREATININE: 1.27 mg/dL — AB (ref 0.50–1.10)
Chloride: 100 mEq/L (ref 96–112)
GLUCOSE: 128 mg/dL — AB (ref 70–99)
Potassium: 3.9 mEq/L (ref 3.5–5.3)
Sodium: 138 mEq/L (ref 135–145)
Total Bilirubin: 0.3 mg/dL (ref 0.2–1.2)
Total Protein: 6.9 g/dL (ref 6.0–8.3)

## 2014-06-02 LAB — PROTIME-INR
INR: 1.21 (ref <1.50)
Prothrombin Time: 15.3 seconds — ABNORMAL HIGH (ref 11.6–15.2)

## 2014-06-02 LAB — AFP TUMOR MARKER: AFP-Tumor Marker: 3.4 ng/mL (ref <6.1)

## 2014-06-06 ENCOUNTER — Telehealth: Payer: Self-pay | Admitting: Gastroenterology

## 2014-06-08 NOTE — Telephone Encounter (Signed)
Called patient TO DISCUSS RESULTS. LVM-CALL [phone] TO DISCUSS. LABS MAR 2016 AND U/S UNCHANGED EXCEPT Hb 7.8 MCV 68. LAST EGD 2013 W/ HYPERPLASTIC SMALL BOWEL POLYPS.  PT NEEDS EGD/POSSIBLE GIVENS CAPSULE PLACEMENT FOR OBSCURE GI BLEED/MICROCYTIC ANEMIA.

## 2014-06-13 ENCOUNTER — Encounter: Payer: Self-pay | Admitting: Gastroenterology

## 2014-06-13 NOTE — Telephone Encounter (Signed)
Called patient TO DISCUSS CONCERNS. LVM ON HOME/CELL-CALL [phone] TO DISCUSS.

## 2014-06-14 ENCOUNTER — Other Ambulatory Visit: Payer: Self-pay

## 2014-06-14 DIAGNOSIS — K7030 Alcoholic cirrhosis of liver without ascites: Secondary | ICD-10-CM

## 2014-06-14 NOTE — Telephone Encounter (Signed)
Labs are faxed and Agile Capsule is set up for 06/19/14 @ 7:00. Instructions are in the mail.

## 2014-06-14 NOTE — Telephone Encounter (Signed)
OPEN IN ERROR 

## 2014-06-14 NOTE — Telephone Encounter (Signed)
Called patient TO DISCUSS RESULTS. Needs AGILE CAPSULE WITHIN 7 DAYS. NEEDS FERRITIN NEXT WEEK.  EGD WITH POSSIBLE GIVENS CAPSULE PLACEMENT APR 1, DX: microcytic anemia/OBSCURE GI BLEED.

## 2014-06-15 ENCOUNTER — Other Ambulatory Visit: Payer: Self-pay

## 2014-06-15 DIAGNOSIS — D509 Iron deficiency anemia, unspecified: Secondary | ICD-10-CM

## 2014-06-15 NOTE — Telephone Encounter (Signed)
Pt does need a PA for the GIVENS per Marjory Lies at [employer]

## 2014-06-19 ENCOUNTER — Telehealth: Payer: Self-pay | Admitting: Gastroenterology

## 2014-06-19 ENCOUNTER — Other Ambulatory Visit: Payer: Self-pay | Admitting: Gastroenterology

## 2014-06-19 DIAGNOSIS — R1032 Left lower quadrant pain: Secondary | ICD-10-CM

## 2014-06-19 NOTE — OR Nursing (Signed)
Zeb Comfort at Dr. Oneida Alar office notified of patient not showing up for Agile capsule.

## 2014-06-19 NOTE — Telephone Encounter (Signed)
Melanie from Short Stay called to let us know that patient did not show up for her agile capsule study.

## 2014-06-19 NOTE — Telephone Encounter (Signed)
Called pt and she is rescheduled for 06/21/2014 @ 7:00am

## 2014-06-21 ENCOUNTER — Ambulatory Visit (HOSPITAL_COMMUNITY)
Admission: RE | Admit: 2014-06-21 | Discharge: 2014-06-21 | Disposition: A | Discharge status: Home / Self Care | Payer: BLUE CROSS/BLUE SHIELD | Source: Ambulatory Visit | Attending: Gastroenterology | Admitting: Gastroenterology

## 2014-06-21 ENCOUNTER — Encounter (HOSPITAL_COMMUNITY)
Admission: RE | Disposition: A | Discharge status: Home / Self Care | Payer: Self-pay | Source: Ambulatory Visit | Attending: Gastroenterology

## 2014-06-21 DIAGNOSIS — D509 Iron deficiency anemia, unspecified: Secondary | ICD-10-CM | POA: Diagnosis not present

## 2014-06-21 DIAGNOSIS — K922 Gastrointestinal hemorrhage, unspecified: Secondary | ICD-10-CM | POA: Insufficient documentation

## 2014-06-21 HISTORY — PX: AGILE CAPSULE: SHX5420

## 2014-06-21 SURGERY — AGILE CAPSULE

## 2014-06-22 ENCOUNTER — Ambulatory Visit (HOSPITAL_COMMUNITY)
Admission: RE | Admit: 2014-06-22 | Discharge: 2014-06-22 | Disposition: A | Discharge status: Home / Self Care | Payer: BLUE CROSS/BLUE SHIELD | Source: Ambulatory Visit | Attending: Gastroenterology | Admitting: Gastroenterology

## 2014-06-22 DIAGNOSIS — K5900 Constipation, unspecified: Secondary | ICD-10-CM | POA: Insufficient documentation

## 2014-06-22 DIAGNOSIS — R1032 Left lower quadrant pain: Secondary | ICD-10-CM

## 2014-06-22 NOTE — Telephone Encounter (Signed)
PT in xray. No order for KUB. Order entered.

## 2014-06-22 NOTE — Addendum Note (Signed)
Addended by: Danie Binder on: 06/22/2014 10:14 AM   Modules accepted: Orders

## 2014-06-25 NOTE — Telephone Encounter (Signed)
noted 

## 2014-06-26 ENCOUNTER — Encounter (HOSPITAL_COMMUNITY): Payer: Self-pay | Admitting: Gastroenterology

## 2014-06-29 ENCOUNTER — Ambulatory Visit (HOSPITAL_COMMUNITY)
Admission: RE | Admit: 2014-06-29 | Discharge: 2014-06-29 | Disposition: A | Discharge status: Home / Self Care | Payer: BLUE CROSS/BLUE SHIELD | Source: Ambulatory Visit | Attending: Gastroenterology | Admitting: Gastroenterology

## 2014-06-29 ENCOUNTER — Encounter (HOSPITAL_COMMUNITY)
Admission: RE | Disposition: A | Discharge status: Home / Self Care | Payer: Self-pay | Source: Ambulatory Visit | Attending: Gastroenterology

## 2014-06-29 ENCOUNTER — Encounter (HOSPITAL_COMMUNITY): Payer: Self-pay | Admitting: *Deleted

## 2014-06-29 DIAGNOSIS — Q2733 Arteriovenous malformation of digestive system vessel: Secondary | ICD-10-CM | POA: Diagnosis not present

## 2014-06-29 DIAGNOSIS — L4050 Arthropathic psoriasis, unspecified: Secondary | ICD-10-CM | POA: Insufficient documentation

## 2014-06-29 DIAGNOSIS — K2950 Unspecified chronic gastritis without bleeding: Secondary | ICD-10-CM | POA: Insufficient documentation

## 2014-06-29 DIAGNOSIS — Z881 Allergy status to other antibiotic agents status: Secondary | ICD-10-CM | POA: Insufficient documentation

## 2014-06-29 DIAGNOSIS — F419 Anxiety disorder, unspecified: Secondary | ICD-10-CM | POA: Diagnosis not present

## 2014-06-29 DIAGNOSIS — K7460 Unspecified cirrhosis of liver: Secondary | ICD-10-CM | POA: Insufficient documentation

## 2014-06-29 DIAGNOSIS — M3500 Sicca syndrome, unspecified: Secondary | ICD-10-CM | POA: Diagnosis not present

## 2014-06-29 DIAGNOSIS — F410 Panic disorder [episodic paroxysmal anxiety] without agoraphobia: Secondary | ICD-10-CM | POA: Insufficient documentation

## 2014-06-29 DIAGNOSIS — K31819 Angiodysplasia of stomach and duodenum without bleeding: Secondary | ICD-10-CM | POA: Insufficient documentation

## 2014-06-29 DIAGNOSIS — D500 Iron deficiency anemia secondary to blood loss (chronic): Secondary | ICD-10-CM | POA: Insufficient documentation

## 2014-06-29 DIAGNOSIS — Z87891 Personal history of nicotine dependence: Secondary | ICD-10-CM | POA: Insufficient documentation

## 2014-06-29 DIAGNOSIS — K589 Irritable bowel syndrome without diarrhea: Secondary | ICD-10-CM | POA: Diagnosis not present

## 2014-06-29 DIAGNOSIS — K219 Gastro-esophageal reflux disease without esophagitis: Secondary | ICD-10-CM | POA: Diagnosis not present

## 2014-06-29 DIAGNOSIS — D509 Iron deficiency anemia, unspecified: Secondary | ICD-10-CM | POA: Diagnosis not present

## 2014-06-29 DIAGNOSIS — K317 Polyp of stomach and duodenum: Secondary | ICD-10-CM

## 2014-06-29 DIAGNOSIS — Z888 Allergy status to other drugs, medicaments and biological substances status: Secondary | ICD-10-CM | POA: Diagnosis not present

## 2014-06-29 DIAGNOSIS — D649 Anemia, unspecified: Secondary | ICD-10-CM | POA: Insufficient documentation

## 2014-06-29 HISTORY — PX: GIVENS CAPSULE STUDY: SHX5432

## 2014-06-29 HISTORY — PX: ESOPHAGOGASTRODUODENOSCOPY: SHX5428

## 2014-06-29 SURGERY — EGD (ESOPHAGOGASTRODUODENOSCOPY)
Anesthesia: Moderate Sedation

## 2014-06-29 MED ORDER — MEPERIDINE HCL 100 MG/ML IJ SOLN
INTRAMUSCULAR | Status: DC
Start: 2014-06-29 — End: 2014-06-29
  Filled 2014-06-29: qty 2

## 2014-06-29 MED ORDER — MIDAZOLAM HCL 5 MG/5ML IJ SOLN
INTRAMUSCULAR | Status: AC
  Filled 2014-06-29: qty 10

## 2014-06-29 MED ORDER — STERILE WATER FOR IRRIGATION IR SOLN
Status: DC | PRN
  Administered 2014-06-29: 13:00:00

## 2014-06-29 MED ORDER — MIDAZOLAM HCL 5 MG/5ML IJ SOLN
INTRAMUSCULAR | Status: DC | PRN
  Administered 2014-06-29 (×2): 2 mg via INTRAVENOUS
  Administered 2014-06-29: 1 mg via INTRAVENOUS
  Administered 2014-06-29: 2 mg via INTRAVENOUS
  Administered 2014-06-29: 1 mg via INTRAVENOUS

## 2014-06-29 MED ORDER — LIDOCAINE VISCOUS 2 % MT SOLN
OROMUCOSAL | Status: DC | PRN
  Administered 2014-06-29: 1 via OROMUCOSAL

## 2014-06-29 MED ORDER — DIPHENHYDRAMINE HCL 50 MG/ML IJ SOLN
INTRAMUSCULAR | Status: AC
  Filled 2014-06-29: qty 1

## 2014-06-29 MED ORDER — DIPHENHYDRAMINE HCL 50 MG/ML IJ SOLN
INTRAMUSCULAR | Status: DC | PRN
  Administered 2014-06-29: 12.5 mg via INTRAVENOUS

## 2014-06-29 MED ORDER — MEPERIDINE HCL 100 MG/ML IJ SOLN
INTRAMUSCULAR | Status: DC | PRN
  Administered 2014-06-29 (×3): 25 mg via INTRAVENOUS
  Administered 2014-06-29: 50 mg via INTRAVENOUS

## 2014-06-29 MED ORDER — SODIUM CHLORIDE 0.9 % IV SOLN
INTRAVENOUS | Status: DC
  Administered 2014-06-29: 12:00:00 via INTRAVENOUS

## 2014-06-29 NOTE — Discharge Instructions (Signed)
You have MILD gastritis, GASTRIC & DUODENAL POLYPS. I biopsied your stomach & REMOVED POLYPS FROM YOUR STOMACH AND SMALL BOWEL.   CONTINUE YOUR WEIGHT LOSS EFFORTS. LOSE 10 LBS.  ADD CENTRUM SILVER DAILY VITAMIN ;0.  DRINK WATER TO KEEP YOUR URINE LIGHT YELLOW.  FOLLOW A LOW FAT DIET. SEE INFO BELOW.  YOUR BIOPSY RESULTS WILL BE AVAILABLE IN MY CHART AFTER APR 5 AND MY OFFICE WILL CONTACT YOU IN 10-14 DAYS WITH YOUR RESULTS.   REPEAT UPPER ENDOSCOPY IN 3 YEARS.   UPPER ENDOSCOPY AFTER CARE Read the instructions outlined below and refer to this sheet in the next week. These discharge instructions provide you with general information on caring for yourself after you leave the hospital. While your treatment has been planned according to the most current medical practices available, unavoidable complications occasionally occur. If you have any problems or questions after discharge, call DR. Wana Mount, [phone].  ACTIVITY  You may resume your regular activity, but move at a slower pace for the next 24 hours.   Take frequent rest periods for the next 24 hours.   Walking will help get rid of the air and reduce the bloated feeling in your belly (abdomen).   No driving for 24 hours (because of the medicine (anesthesia) used during the test).   You may shower.   Do not sign any important legal documents or operate any machinery for 24 hours (because of the anesthesia used during the test).    NUTRITION  Drink plenty of fluids.   You may resume your normal diet as instructed by your doctor.   Begin with a light meal and progress to your normal diet. Heavy or fried foods are harder to digest and may make you feel sick to your stomach (nauseated).   Avoid alcoholic beverages for 24 hours or as instructed.    MEDICATIONS  You may resume your normal medications.   WHAT YOU CAN EXPECT TODAY  Some feelings of bloating in the abdomen.   Passage of more gas than usual.    IF  YOU HAD A BIOPSY TAKEN DURING THE UPPER ENDOSCOPY:  Eat a soft diet IF YOU HAVE NAUSEA, BLOATING, ABDOMINAL PAIN, OR VOMITING.    FINDING OUT THE RESULTS OF YOUR TEST Not all test results are available during your visit. DR. Oneida Alar WILL CALL YOU WITHIN 14 DAYS OF YOUR PROCEDUE WITH YOUR RESULTS. Do not assume everything is normal if you have not heard from DR. Keylani Perlstein IN 14 DAYS, CALL HER OFFICE AT [phone].  SEEK IMMEDIATE MEDICAL ATTENTION AND CALL THE OFFICE: [phone] IF:  You have more than a spotting of blood in your stool.   Your belly is swollen (abdominal distention).   You are nauseated or vomiting.   You have a temperature over 101F.   You have abdominal pain or discomfort that is severe or gets worse throughout the day.   Gastritis  Gastritis is an inflammation (the body's way of reacting to injury and/or infection) of the stomach. It can also be caused BY ASPIRIN, BC/GOODY POWDER'S, (IBUPROFEN) MOTRIN, OR ALEVE (NAPROXEN), chemicals (including alcohol), SPICY FOODS, and medications. This illness may be associated with generalized malaise (feeling tired, not well), UPPER ABDOMINAL STOMACH cramps, and fever. One common bacterial cause of gastritis is an organism known as H. Pylori. This can be treated with antibiotics.     Low-Fat Diet BREADS, CEREALS, PASTA, RICE, DRIED PEAS, AND BEANS These products are high in carbohydrates and most are low in fat.  Therefore, they can be increased in the diet as substitutes for fatty foods. They too, however, contain calories and should not be eaten in excess. Cereals can be eaten for snacks as well as for breakfast.  Include foods that contain fiber (fruits, vegetables, whole grains, and legumes). Research shows that fiber may lower blood cholesterol levels, especially the water-soluble fiber found in fruits, vegetables, oat products, and legumes. FRUITS AND VEGETABLES It is good to eat fruits and vegetables. Besides being sources  of fiber, both are rich in vitamins and some minerals. They help you get the daily allowances of these nutrients. Fruits and vegetables can be used for snacks and desserts. MEATS Limit lean meat, chicken, [place], and fish to no more than 6 ounces per day. Beef, Pork, and Lamb Use lean cuts of beef, pork, and lamb. Lean cuts include:  Extra-lean ground beef.  Arm roast.  Sirloin tip.  Center-cut ham.  Round steak.  Loin chops.  Rump roast.  Tenderloin.  Trim all fat off the outside of meats before cooking. It is not necessary to severely decrease the intake of red meat, but lean choices should be made. Lean meat is rich in protein and contains a highly absorbable form of iron. Premenopausal women, in particular, should avoid reducing lean red meat because this could increase the risk for low red blood cells (iron-deficiency anemia).  Chicken and [place] These are good sources of protein. The fat of poultry can be reduced by removing the skin and underlying fat layers before cooking. Chicken and [place] can be substituted for lean red meat in the diet. Poultry should not be fried or covered with high-fat sauces. Fish and Shellfish Fish is a good source of protein. Shellfish contain cholesterol, but they usually are low in saturated fatty acids. The preparation of fish is important. Like chicken and [place], they should not be fried or covered with high-fat sauces. EGGS Egg whites contain no fat or cholesterol. They can be eaten often. Try 1 to 2 egg whites instead of whole eggs in recipes or use egg substitutes that do not contain yolk.  MILK AND DAIRY PRODUCTS Use skim or 1% milk instead of 2% or whole milk. Decrease whole milk, natural, and processed cheeses. Use nonfat or low-fat (2%) cottage cheese or low-fat cheeses made from vegetable oils. Choose nonfat or low-fat (1 to 2%) yogurt. Experiment with evaporated skim milk in recipes that call for heavy cream. Substitute low-fat yogurt or low-fat  cottage cheese for sour cream in dips and salad dressings. Have at least 2 servings of low-fat dairy products, such as 2 glasses of skim (or 1%) milk each day to help get your daily calcium intake.  FATS AND OILS Butterfat, lard, and beef fats are high in saturated fat and cholesterol. These should be avoided.Vegetable fats do not contain cholesterol. AVOID coconut oil, palm oil, and palm kernel oil, WHICH are very high in saturated fats. These should be limited. These fats are often used in bakery goods, processed foods, popcorn, oils, and nondairy creamers. Vegetable shortenings and some peanut butters contain hydrogenated oils, which are also saturated fats. Read the labels on these foods and check for saturated vegetable oils.  Desirable liquid vegetable oils are corn oil, cottonseed oil, olive oil, canola oil, safflower oil, soybean oil, and sunflower oil. Peanut oil is not as good, but small amounts are acceptable. Buy a heart-healthy tub margarine that has no partially hydrogenated oils in the ingredients. AVOID Mayonnaise and salad dressings often are  made from unsaturated fats.  OTHER EATING TIPS Snacks  Most sweets should be limited as snacks. They tend to be rich in calories and fats, and their caloric content outweighs their nutritional value. Some good choices in snacks are graham crackers, melba toast, soda crackers, bagels (no egg), English muffins, fruits, and vegetables. These snacks are preferable to snack crackers, [place] fries, and chips. Popcorn should be air-popped or cooked in small amounts of liquid vegetable oil.  Desserts Eat fruit, low-fat yogurt, and fruit ices instead of pastries, cake, and cookies. Sherbet, angel food cake, gelatin dessert, frozen low-fat yogurt, or other frozen products that do not contain saturated fat (pure fruit juice bars, frozen ice pops) are also acceptable.   COOKING METHODS Choose those methods that use little or no fat. They include: Poaching.   Braising.  Steaming.  Grilling.  Baking.  Stir-frying.  Broiling.  Microwaving.  Foods can be cooked in a nonstick pan without added fat, or use a nonfat cooking spray in regular cookware. Limit fried foods and avoid frying in saturated fat. Add moisture to lean meats by using water, broth, cooking wines, and other nonfat or low-fat sauces along with the cooking methods mentioned above. Soups and stews should be chilled after cooking. The fat that forms on top after a few hours in the refrigerator should be skimmed off. When preparing meals, avoid using excess salt. Salt can contribute to raising blood pressure in some people.  EATING AWAY FROM HOME Order entres, potatoes, and vegetables without sauces or butter. When meat exceeds the size of a deck of cards (3 to 4 ounces), the rest can be taken home for another meal. Choose vegetable or fruit salads and ask for low-calorie salad dressings to be served on the side. Use dressings sparingly. Limit high-fat toppings, such as bacon, crumbled eggs, cheese, sunflower seeds, and olives. Ask for heart-healthy tub margarine instead of butter.

## 2014-06-29 NOTE — H&P (Signed)
Primary Care Physician:  Rubbie Battiest, MD Primary Gastroenterologist:  Dr. Oneida Alar  Pre-Procedure History & Physical: HPI:  Deanna Thompson is a 59 y.o. female here for Anemia/OBSCURE GI BLEED.  Past Medical History  Diagnosis Date  . IBS (irritable bowel syndrome)   . GERD (gastroesophageal reflux disease)   . Panic attack   . Anxiety   . Psoriatic arthritis   . ETOH abuse     Quit in 07/2011   . Sjogren's disease   . Cirrhosis     Presumably alcoholic cirrhosis  . Complication of anesthesia   . PONV (postoperative nausea and vomiting)   . Abdominal hernia   . 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 Seizures 1997    1 seizure from coil insertion but no more seizures and no meds for seizures  . HTN (hypertension)     no longer medicated, dated on 04/25/12  . Fatigue     due to Sjogren's disease  . Arthritis     back  and down flank  right side more than left  . Ascites 08/27/2011    FEB 2014 10.7 L   . Right inguinal hernia 08/16/2012  . [place] HEAD 02/03/2007    Qualifier: Diagnosis of  By: Aline Brochure MD, Dorothyann Peng      Past Surgical History  Procedure Laterality Date  . Esophageal biopsy  10/13/2011    SLF: mild gastritis/duodenal polypoid lesion   . Colonoscopy  10/13/2011    [id number] hemorrhoids/varices rectal/lesion in ascending colon(HYPERPLASTIC POLYP)  . Paracentesis  Feb 2014    10.7 liters  . Colonoscopy N/A 01/17/2013    Procedure: COLONOSCOPY;  Surgeon: Danie Binder, MD;  [facility];  Service: Endoscopy;  Laterality: N/A;  9:30-moved to 12:30 Darius Bump to notify pt  . Cerebral aneurysm repair  1997  . Umbilical hernia repair N/A 06/09/2013    Procedure: UMBILICAL HERNIORRHAPHY WITH MESH;  Surgeon: Jamesetta So, MD;  Location: [facility];  Service: General;  Laterality: N/A;  . Insertion of mesh N/A 06/09/2013    Procedure: INSERTION OF MESH;  Surgeon: Jamesetta So, MD;  Location: [facility];  Service: General;   Laterality: N/A;  . Inguinal hernia repair Right 08/28/2013    Procedure: RIGHT NGUINAL HERNIORRHAPHY WITH MESH;  Surgeon: Jamesetta So, MD;  Location: [facility];  Service: General;  Laterality: Right;  . Insertion of mesh Right 08/28/2013    Procedure: INSERTION OF MESH;  Surgeon: Jamesetta So, MD;  Location: [facility];  Service: General;  Laterality: Right;  . Agile capsule N/A 06/21/2014    Procedure: AGILE CAPSULE;  Surgeon: Danie Binder, MD;  [facility];  Service: Endoscopy;  Laterality: N/A;  0700    Prior to Admission medications   Medication Sig Start Date End Date Taking? Authorizing Provider  amitriptyline (ELAVIL) 10 MG tablet Take 10 mg by mouth at bedtime.   Yes Historical Provider, MD  bumetanide (BUMEX) 1 MG tablet Take 1 tablet (1 mg total) by mouth 2 (two) times daily. 05/23/14  Yes Mahala Menghini, PA-C  buPROPion (WELLBUTRIN XL) 150 MG 24 hr tablet Take 1 tablet (150 mg total) by mouth at bedtime. Patient taking differently: Take 150 mg by mouth daily.  02/13/14  Yes Mikey Kirschner, MD  carboxymethylcellulose (REFRESH PLUS) 0.5 % SOLN Place 2 drops into both eyes 3 (three) times daily as needed (Dry Eyes).    Yes Historical  Provider, MD  escitalopram (LEXAPRO) 20 MG tablet Take 1 tablet (20 mg total) by mouth at bedtime. 12/25/13  Yes Mikey Kirschner, MD  folic acid (FOLVITE) 1 MG tablet TAKE (1) TABLET BY MOUTH EACH MORNING. 01/25/14  Yes Orvil Feil, NP  loratadine (CLARITIN) 10 MG tablet Take 10 mg by mouth daily as needed for allergies.    Yes Historical Provider, MD  NEXIUM 40 MG capsule TAKE 1 CAPSULE DAILY BEFORE BREAKFAST 11/06/13  Yes Mahala Menghini, PA-C  sodium chloride (OCEAN) 0.65 % nasal spray Place 1 spray into the nose as needed for congestion.   Yes Historical Provider, MD  spironolactone (ALDACTONE) 100 MG tablet TAKE (1) TABLET BY MOUTH TWICE DAILY. 03/20/14  Yes Mahala Menghini, PA-C  XIFAXAN 550 MG TABS tablet TAKE (1) TABLET BY MOUTH TWICE DAILY.  06/19/14  Yes Orvil Feil, NP    Allergies as of 06/15/2014 - Review Complete 03/15/2014  Allergen Reaction Noted  . Omeprazole Shortness Of Breath 08/19/2011  . Other Shortness Of Breath and Rash 01/09/2013  . Phenytoin Rash   . Keflex [cephalexin] Other (See Comments)     Family History  Problem Relation Age of Onset  . Cirrhosis Mother     nash  . Colon cancer Neg Hx   . Inflammatory bowel disease Neg Hx   . Asthma    . Heart disease    . COPD Father   . Heart disease Father     History   Social History  . Marital Status: Married    Spouse Name: N/A  . Number of Children: 0  . Years of Education: N/A   Occupational History  .     Social History Main Topics  . Smoking status: Former Smoker -- 2.00 packs/day for 12 years    Types: Cigarettes    Quit date: 03/31/1995  . Smokeless tobacco: Never Used     Comment: quit in 1997  . Alcohol Use: No     Comment: Bottle of wine a day for years, but quit in 07/2011  . Drug Use: No  . Sexual Activity: Yes    Birth Control/ Protection: Post-menopausal   Other Topics Concern  . Not on file   Social History Narrative   Quit drinking alcohol 3 weeks ago. No drugs, no IVDU.     Review of Systems: See HPI, otherwise negative ROS   Physical Exam: BP 135/78 mmHg  Pulse 93  Temp(Src) 98 F (36.7 C) (Oral)  Resp 18  Ht 5\' 8"  (1.727 m)  Wt 237 lb (107.502 kg)  BMI 36.04 kg/m2  SpO2 99% General:   Alert,  pleasant and cooperative in NAD Head:  Normocephalic and atraumatic. Neck:  Supple; Lungs:  Clear throughout to auscultation.    Heart:  Regular rate and rhythm. Abdomen:  Soft, nontender and nondistended. Normal bowel sounds, without guarding, and without rebound.   Neurologic:  Alert and  oriented x4;  grossly normal neurologically.  Impression/Plan:    Anemia/OBSCURE GI BLEED PLAN:  1. EGD/? GIVENS TODAY

## 2014-06-29 NOTE — Op Note (Signed)
[facility] [address] [place], [address]   ENDOSCOPY PROCEDURE REPORT  PATIENT: Deanna Thompson, Deanna Thompson  MR#: [id number] BIRTHDATE: Aug 31, 1955 , 79  yrs. old GENDER: female  ENDOSCOPIST: Danie Binder, MD REFERRED LM:[id number] Wolfgang Phoenix, M.D. PROCEDURE DATE: 07-24-14 PROCEDURE:   EGD w/ biopsy and EGD w/ snare polypectomy  INDICATIONS:anemia. MEDICATIONS: Benadryl 12.5 mg IV, Meperidine (Demerol) 125 mg IV, and Versed 8 mg IV TOPICAL ANESTHETIC:   Viscous Xylocaine ASA CLASS:  DESCRIPTION OF PROCEDURE:     Physical exam was performed.  Informed consent was obtained from the patient after explaining the benefits, risks, and alternatives to the procedure.  The patient was connected to the monitor and placed in the left lateral position.  Continuous oxygen was provided by nasal cannula and IV medicine administered through an indwelling cannula.  After administration of sedation, the patients esophagus was intubated and the EG-[id number] ([id number])  endoscope was advanced under direct visualization to the second portion of the duodenum.  The scope was removed slowly by carefully examining the color, texture, anatomy, and integrity of the mucosa on the way out.  The patient was recovered in endoscopy and discharged home in satisfactory condition.   ESOPHAGUS: The mucosa of the esophagus appeared normal.   STOMACH: Multiple medium sized sessile polyps with friable surfaces were found.  A polypectomy was performed with snare cautery.   DUODENUM: An arteriovenous malformation measuring 17mm in size was found in the duodenal bulb.  Bipolar (BICAP) cautery with a 7Fr probe was applied to the site for 5 secs using 20 watts power.  Moderate pressure was applied to the cautery site with complete hemostasis achieved.   A polypoid shaped sessile polyp measuring 8 mm in size was found in the 2nd part of the duodenum.  A polypectomy was performed with snare cautery. COMPLICATIONS:  There were no immediate complications.  ENDOSCOPIC IMPRESSION: 1.   ANEMIA DUE TO POLYPOID LESIONS IN THE ANTRUM, AND DUODENAL POLYPS 2.   SINGLE DUODENUM AVM  RECOMMENDATIONS: CONTINUE YOUR WEIGHT LOSS EFFORTS. ADD CENTRUM SILVER DAILY VITAMIN. DRINK WATER. FOLLOW A LOW FAT DIET. AWAIT BIOPSY. REPEAT UPPER ENDOSCOPY IN 3 YEARS.  REPEAT EXAM:  eSigned:  Danie Binder, MD 07-24-2014 6:40 PM  CPT CODES: ICD CODES:  The ICD and CPT codes recommended by this software are interpretations from the data that the clinical staff has captured with the software.  The verification of the translation of this report to the ICD and CPT codes and modifiers is the sole responsibility of the health care institution and practicing physician where this report was generated.  [employer] will not be held responsible for the validity of the ICD and CPT codes included on this report.  AMA assumes no liability for data contained or not contained herein. CPT is a Designer, television/film set of the [employer].

## 2014-06-30 ENCOUNTER — Telehealth: Payer: Self-pay | Admitting: Gastroenterology

## 2014-06-30 LAB — FERRITIN: Ferritin: 5 ng/mL — ABNORMAL LOW (ref 10–291)

## 2014-06-30 NOTE — Telephone Encounter (Signed)
PLEASE CALL PT. HER IRON STORES ARE LOW. SHE NEEDS TO SEE HEMATOLOGY FOR IV IRON INFUSIONS, Dx: Fe DEFICIENCY ANEMIA.

## 2014-07-02 ENCOUNTER — Other Ambulatory Visit: Payer: Self-pay

## 2014-07-02 ENCOUNTER — Encounter (HOSPITAL_COMMUNITY): Payer: Self-pay | Admitting: Gastroenterology

## 2014-07-02 DIAGNOSIS — D509 Iron deficiency anemia, unspecified: Secondary | ICD-10-CM

## 2014-07-02 NOTE — Telephone Encounter (Signed)
Called and spoke with husband Kris Mouton) states pt is not home. Left message with him regarding referral to hematology.

## 2014-07-05 ENCOUNTER — Ambulatory Visit (INDEPENDENT_AMBULATORY_CARE_PROVIDER_SITE_OTHER): Payer: BLUE CROSS/BLUE SHIELD | Admitting: Gastroenterology

## 2014-07-05 ENCOUNTER — Encounter: Payer: Self-pay | Admitting: Gastroenterology

## 2014-07-05 ENCOUNTER — Other Ambulatory Visit: Payer: Self-pay

## 2014-07-05 VITALS — BP 127/79 | HR 92 | Temp 97.6°F | Ht 68.0 in | Wt 242.2 lb

## 2014-07-05 DIAGNOSIS — K922 Gastrointestinal hemorrhage, unspecified: Secondary | ICD-10-CM

## 2014-07-05 DIAGNOSIS — K7030 Alcoholic cirrhosis of liver without ascites: Secondary | ICD-10-CM

## 2014-07-05 DIAGNOSIS — D500 Iron deficiency anemia secondary to blood loss (chronic): Secondary | ICD-10-CM

## 2014-07-05 DIAGNOSIS — D509 Iron deficiency anemia, unspecified: Secondary | ICD-10-CM

## 2014-07-05 NOTE — Assessment & Plan Note (Addendum)
WELL COMPENSATED DISEASE. EGD UTD.  REVIEWED LAB/BIOPSY RESULTS AND DISCUSSED PROCEDURE, BENEFITS, AND RISKS OF GIVENS WITH PT.  REPEAT EGD IN 2-3 YEARS FOLLOW UP IN 6 MOS. NEEDS LABS AND U/S.

## 2014-07-05 NOTE — Progress Notes (Signed)
cc'ed to pcp °

## 2014-07-05 NOTE — Assessment & Plan Note (Signed)
Obscure GI BLEED/FEDA W/O SIGNIFICANT BRBPR OR MELENA. GASTRIC Bx DID NOT REVEAL ETIOLOGY FOR FEDA.  PROCEED WITH GIVENS CAPSULE. IF NO SOURCE FOR FEDA IDENTIFIED, CONSIDER TCS IN 3 MOS  OPV HEMATOLOGY APR 15. FOLLOW UP IN 3 MOS.

## 2014-07-05 NOTE — Progress Notes (Signed)
Subjective:    Patient ID: Deanna Thompson, female    DOB: [birth year], 59 y.o.   MRN: [id number]  Rubbie Battiest, MD  HPI PT HAVING TOOTH PROBLEMS AND RECENTLY DIAGNOSED WITH FIBROMYALGIA. HAS A LOT OF INDIGESTION: FEELS FULL AFTER EATING & BELCHES CONSTANTLY. USES A LOT OF TUMS-5 SMALL TUMS A DAY. ON NEXIUM BID. BMs: NOT GREAT. ONCE A DAY WITH FIBER ONE DAILY. FEELS LIKE SHE DOESN'T COMPLETELY EVACUATE.   PT DENIES FEVER, CHILLS, HEMATOCHEZIA, nausea, vomiting, melena, diarrhea, CHEST PAIN, SHORTNESS OF BREATH,  CHANGE IN BOWEL IN HABITS, constipation, abdominal pain, OR problems swallowing.  Past Medical History  Diagnosis Date  . IBS (irritable bowel syndrome)   . GERD (gastroesophageal reflux disease)   . Panic attack   . Anxiety   . Psoriatic arthritis   . ETOH abuse     Quit in 07/2011   . Sjogren's disease   . Cirrhosis     Presumably alcoholic cirrhosis  . Complication of anesthesia   . PONV (postoperative nausea and vomiting)   . Abdominal hernia   . 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 Seizures 1997    1 seizure from coil insertion but no more seizures and no meds for seizures  . HTN (hypertension)     no longer medicated, dated on 04/25/12  . Fatigue     due to Sjogren's disease  . Arthritis     back  and down flank  right side more than left  . Ascites 08/27/2011    FEB 2014 10.7 L   . Right inguinal hernia 08/16/2012  . [place] HEAD 02/03/2007    Qualifier: Diagnosis of  By: Aline Brochure MD, Dorothyann Peng     Past Surgical History  Procedure Laterality Date  . Esophageal biopsy  10/13/2011    SLF: mild gastritis/duodenal polypoid lesion   . Colonoscopy  10/13/2011    [id number] hemorrhoids/varices rectal/lesion in ascending colon(HYPERPLASTIC POLYP)  . Paracentesis  Feb 2014    10.7 liters  . Colonoscopy N/A 01/17/2013    Procedure: COLONOSCOPY;  Surgeon: Danie Binder, MD;  [facility];  Service: Endoscopy;   Laterality: N/A;  9:30-moved to 12:30 Darius Bump to notify pt  . Cerebral aneurysm repair  1997  . Umbilical hernia repair N/A 06/09/2013    Procedure: UMBILICAL HERNIORRHAPHY WITH MESH;  Surgeon: Jamesetta So, MD;  Location: [facility];  Service: General;  Laterality: N/A;  . Insertion of mesh N/A 06/09/2013    Procedure: INSERTION OF MESH;  Surgeon: Jamesetta So, MD;  Location: [facility];  Service: General;  Laterality: N/A;  . Inguinal hernia repair Right 08/28/2013    Procedure: RIGHT NGUINAL HERNIORRHAPHY WITH MESH;  Surgeon: Jamesetta So, MD;  [facility]: [facility];  Service: General;  Laterality: Right;  . Insertion of mesh Right 08/28/2013    Procedure: INSERTION OF MESH;  Surgeon: Jamesetta So, MD;  [facility];  Service: General;  Laterality: Right;  . Agile capsule N/A 06/21/2014    Procedure: AGILE CAPSULE;  Surgeon: Danie Binder, MD;  [facility];  Service: Endoscopy;  Laterality: N/A;  0700  . Esophagogastroduodenoscopy N/A 06/29/2014    Procedure: ESOPHAGOGASTRODUODENOSCOPY (EGD);  Surgeon: Danie Binder, MD;  [facility];  Service: Endoscopy;  Laterality: N/A;  1230  . Givens capsule study N/A 06/29/2014    Procedure: GIVENS CAPSULE STUDY;  Surgeon: Danie Binder, MD;  [facility];  Service: Endoscopy;  Laterality: N/A;    Allergies  Allergen Reactions  . Omeprazole Shortness Of Breath  . Other Shortness Of Breath and Rash    Red Meat Causes shortness of breath, rash and flu like symptoms.   . Phenytoin Rash  . Keflex [Cephalexin] Other (See Comments)    Severe yeast infection  . Phenergan [Promethazine Hcl] Other (See Comments)    Not in right state of mind, HALLUCINATION   Current Outpatient Prescriptions  Medication Sig Dispense Refill  . amitriptyline (ELAVIL) 10 MG tablet Take 10 mg by mouth at bedtime.    . bumetanide (BUMEX) 1 MG tablet Take 1 tablet (1 mg total) by mouth 2 (two) times daily.    Marland Kitchen buPROPion (WELLBUTRIN XL) 150 MG 24  hr tablet Take 1 tablet (150 mg total) by mouth at bedtime. (Patient taking differently: Take 150 mg by mouth daily. )    . carboxymethylcellulose (REFRESH PLUS) 0.5 % SOLN Place 2 drops into both eyes 3 (three) times daily as needed (Dry Eyes).     Marland Kitchen escitalopram (LEXAPRO) 20 MG tablet Take 1 tablet (20 mg total) by mouth at bedtime.    . folic acid (FOLVITE) 1 MG tablet TAKE (1) TABLET BY MOUTH EACH MORNING.    Marland Kitchen loratadine (CLARITIN) 10 MG tablet Take 10 mg by mouth daily as needed for allergies.     Marland Kitchen NEXIUM 40 MG capsule TAKE 1 CAPSULE DAILY BEFORE BREAKFAST    . sodium chloride (OCEAN) 0.65 % nasal spray Place 1 spray into the nose as needed for congestion.    Marland Kitchen spironolactone (ALDACTONE) 100 MG tablet TAKE (1) TABLET BY MOUTH TWICE DAILY.    Marland Kitchen XIFAXAN 550 MG TABS tablet TAKE (1) TABLET BY MOUTH TWICE DAILY.     Review of Systems     Objective:   Physical Exam  Constitutional: She is oriented to person, place, and time. She appears well-developed and well-nourished. No distress.  HENT:  Head: Normocephalic and atraumatic.  Mouth/Throat: Oropharynx is clear and moist. No oropharyngeal exudate.  Eyes: Pupils are equal, round, and reactive to light. No scleral icterus.  Neck: Normal range of motion. Neck supple.  Cardiovascular: Normal rate, regular rhythm and normal heart sounds.   Pulmonary/Chest: Effort normal and breath sounds normal. No respiratory distress.  Abdominal: Soft. Bowel sounds are normal. She exhibits no distension. There is tenderness. There is no rebound and no guarding.  MILD TTP IN THE EPIGASTRIUM   Musculoskeletal: She exhibits no edema.  Lymphadenopathy:    She has no cervical adenopathy.  Neurological: She is alert and oriented to person, place, and time.  NO FOCAL DEFICITS   Psychiatric: She has a normal mood and affect.  Vitals reviewed.         Assessment & Plan:

## 2014-07-05 NOTE — Patient Instructions (Addendum)
COMPLETE CAPSULE STUDY APR 15.  SEE HEMATOLOGY APR 15.  CONTINUE FIBER SUPPLEMENTS.  ADD COLACE WITH STIMULANT LAXATIVE(SENNA) ONE OR 2 TIMES A DAY TO HAVE REGULAR BM.  FOLLOW UP IN 3 MOS.

## 2014-07-05 NOTE — Progress Notes (Signed)
ON RECALL  °

## 2014-07-12 ENCOUNTER — Telehealth: Payer: Self-pay

## 2014-07-12 NOTE — Telephone Encounter (Signed)
[facility]iv[facility] Approved  PA # [id number]

## 2014-07-13 ENCOUNTER — Encounter (HOSPITAL_COMMUNITY): Payer: Self-pay | Admitting: Hematology & Oncology

## 2014-07-13 ENCOUNTER — Encounter (HOSPITAL_COMMUNITY)
Admission: RE | Disposition: A | Discharge status: Home / Self Care | Payer: Self-pay | Source: Ambulatory Visit | Attending: Gastroenterology

## 2014-07-13 ENCOUNTER — Encounter (HOSPITAL_COMMUNITY): Payer: BLUE CROSS/BLUE SHIELD | Attending: Hematology & Oncology | Admitting: Hematology & Oncology

## 2014-07-13 ENCOUNTER — Ambulatory Visit (HOSPITAL_COMMUNITY)
Admission: RE | Admit: 2014-07-13 | Discharge: 2014-07-13 | Disposition: A | Discharge status: Home / Self Care | Payer: BLUE CROSS/BLUE SHIELD | Source: Ambulatory Visit | Attending: Gastroenterology | Admitting: Gastroenterology

## 2014-07-13 ENCOUNTER — Other Ambulatory Visit (HOSPITAL_COMMUNITY): Payer: Self-pay

## 2014-07-13 VITALS — BP 116/64 | HR 84 | Temp 99.1°F | Resp 16 | Ht 67.5 in | Wt 241.5 lb

## 2014-07-13 DIAGNOSIS — D509 Iron deficiency anemia, unspecified: Secondary | ICD-10-CM

## 2014-07-13 DIAGNOSIS — D500 Iron deficiency anemia secondary to blood loss (chronic): Secondary | ICD-10-CM | POA: Diagnosis not present

## 2014-07-13 DIAGNOSIS — K7469 Other cirrhosis of liver: Secondary | ICD-10-CM | POA: Diagnosis not present

## 2014-07-13 DIAGNOSIS — K922 Gastrointestinal hemorrhage, unspecified: Secondary | ICD-10-CM | POA: Diagnosis not present

## 2014-07-13 HISTORY — PX: GIVENS CAPSULE STUDY: SHX5432

## 2014-07-13 LAB — CBC WITH DIFFERENTIAL/PLATELET
BASOS ABS: 0.1 10*3/uL (ref 0.0–0.1)
BASOS PCT: 1 % (ref 0–1)
Eosinophils Absolute: 0.3 10*3/uL (ref 0.0–0.7)
Eosinophils Relative: 3 % (ref 0–5)
HEMATOCRIT: 27 % — AB (ref 36.0–46.0)
Hemoglobin: 7.9 g/dL — ABNORMAL LOW (ref 12.0–15.0)
Lymphocytes Relative: 26 % (ref 12–46)
Lymphs Abs: 2 10*3/uL (ref 0.7–4.0)
MCH: 18.7 pg — ABNORMAL LOW (ref 26.0–34.0)
MCHC: 29.3 g/dL — AB (ref 30.0–36.0)
MCV: 64 fL — ABNORMAL LOW (ref 78.0–100.0)
Monocytes Absolute: 0.7 10*3/uL (ref 0.1–1.0)
Monocytes Relative: 9 % (ref 3–12)
Neutro Abs: 4.7 10*3/uL (ref 1.7–7.7)
Neutrophils Relative %: 62 % (ref 43–77)
Platelets: 298 10*3/uL (ref 150–400)
RBC: 4.22 MIL/uL (ref 3.87–5.11)
RDW: 18 % — AB (ref 11.5–15.5)
WBC: 7.7 10*3/uL (ref 4.0–10.5)

## 2014-07-13 LAB — LACTATE DEHYDROGENASE: LDH: 180 U/L (ref 94–250)

## 2014-07-13 LAB — RETICULOCYTES
RBC.: 4.22 MIL/uL (ref 3.87–5.11)
Retic Count, Absolute: 50.6 10*3/uL (ref 19.0–186.0)
Retic Ct Pct: 1.2 % (ref 0.4–3.1)

## 2014-07-13 LAB — SEDIMENTATION RATE: SED RATE: 58 mm/h — AB (ref 0–22)

## 2014-07-13 SURGERY — IMAGING PROCEDURE, GI TRACT, INTRALUMINAL, VIA CAPSULE

## 2014-07-13 NOTE — Progress Notes (Signed)
[place] CONSULT NOTE  Patient Care Team: Mikey Kirschner, MD as PCP - General (Family Medicine) Danie Binder, MD as Consulting Physician (Gastroenterology) Hadassah Pais (Internal Medicine)  CHIEF COMPLAINTS/PURPOSE OF CONSULTATION:  Severe microcytic Anemia with serum ferritin 5 ng/ml Cirrhosis Givens Capsule History of folate deficiency 2.8 ng/ml EGD 06/29/2014 with polypoid lesions in the duodenum, single duodenal AVM Colonoscopy on 01/17/2013 with rectal varices, polyps and hemorrhoids   HISTORY OF PRESENTING ILLNESS:  [place] 59 y.o. female is here because of Iron deficiency anemia. He has a history of cirrhosis. She has a history of iron deficiency that has gone progressively worse over the recent past. She is actively undergoing a Givens capsule study. She states she has never seen blood in her stool. She has problems with constipation. He has a history of psoriasis. But states she has had no active skin lesions and several years. He drank quite heavily but states she has had no alcohol since May 2013. She has been on both [employer] and Enbrel for psoriatic arthritis.  Laboratory studies from 07/13/2014 showed a hemoglobin of 7.9, hematocrit of 27, and MCV of 64. Ferritin was 5 ng/ml She gets quite fatigued. She reports that this fatigue has gotten progressively worse over the last year. She eats 6 lemon ices a day and has done so for about 15 months.   MEDICAL HISTORY:  Past Medical History  Diagnosis Date  . IBS (irritable bowel syndrome)   . GERD (gastroesophageal reflux disease)   . Panic attack   . Anxiety   . Psoriatic arthritis   . ETOH abuse     Quit in 07/2011   . Sjogren's disease   . Cirrhosis     Presumably alcoholic cirrhosis  . Complication of anesthesia   . PONV (postoperative nausea and vomiting)   . Abdominal hernia   . 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 Seizures 1997    1 seizure from coil  insertion but no more seizures and no meds for seizures  . HTN (hypertension)     no longer medicated, dated on 04/25/12  . Fatigue     due to Sjogren's disease  . Arthritis     back  and down flank  right side more than left  . Ascites 08/27/2011    FEB 2014 10.7 L   . Right inguinal hernia 08/16/2012  . May HEAD 02/03/2007    Qualifier: Diagnosis of  By: Aline Brochure MD, Dorothyann Peng    . Alcoholic hepatitis 03/30/7508    SURGICAL HISTORY: Past Surgical History  Procedure Laterality Date  . Esophageal biopsy  10/13/2011    SLF: mild gastritis/duodenal polypoid lesion   . Colonoscopy  10/13/2011    CHE:NIDPOEUM hemorrhoids/varices rectal/lesion in ascending colon(HYPERPLASTIC POLYP)  . Paracentesis  Feb 2014    10.7 liters  . Colonoscopy N/A 01/17/2013    Procedure: COLONOSCOPY;  Surgeon: Danie Binder, MD;  [facility];  Service: Endoscopy;  Laterality: N/A;  9:30-moved to 12:30 Darius Bump to notify pt  . Cerebral aneurysm repair  1997  . Umbilical hernia repair N/A 06/09/2013    Procedure: UMBILICAL HERNIORRHAPHY WITH MESH;  Surgeon: Jamesetta So, MD;  Location: [facility];  Service: General;  Laterality: N/A;  . Insertion of mesh N/A 06/09/2013    Procedure: INSERTION OF MESH;  Surgeon: Jamesetta So, MD;  Location: [facility];  Service: General;  Laterality: N/A;  .  Inguinal hernia repair Right 08/28/2013    Procedure: RIGHT NGUINAL HERNIORRHAPHY WITH MESH;  Surgeon: Jamesetta So, MD;  Location: [facility];  Service: General;  Laterality: Right;  . Insertion of mesh Right 08/28/2013    Procedure: INSERTION OF MESH;  Surgeon: Jamesetta So, MD;  Location: [facility];  Service: General;  Laterality: Right;  . Agile capsule N/A 06/21/2014    Procedure: AGILE CAPSULE;  Surgeon: Danie Binder, MD;  [facility];  Service: Endoscopy;  Laterality: N/A;  0700  . Esophagogastroduodenoscopy N/A 06/29/2014    Procedure: ESOPHAGOGASTRODUODENOSCOPY (EGD);  Surgeon: Danie Binder,  MD;  [facility];  Service: Endoscopy;  Laterality: N/A;  1230  . Givens capsule study N/A 06/29/2014    Procedure: GIVENS CAPSULE STUDY;  Surgeon: Danie Binder, MD;  [facility];  Service: Endoscopy;  Laterality: N/A;  . Givens capsule study N/A 07/13/2014    Procedure: GIVENS CAPSULE STUDY;  Surgeon: Danie Binder, MD;  [facility];  Service: Endoscopy;  Laterality: N/A;  700    SOCIAL HISTORY: History   Social History  . Marital Status: Married    Spouse Name: N/A  . Number of Children: 0  . Years of Education: N/A   Occupational History  .     Social History Main Topics  . Smoking status: Former Smoker -- 2.00 packs/day for 12 years    Types: Cigarettes    Quit date: 03/31/1995  . Smokeless tobacco: Never Used     Comment: quit in 1997  . Alcohol Use: No     Comment: Bottle of wine a day for years, but quit in 07/2011  . Drug Use: No  . Sexual Activity: Yes    Birth Control/ Protection: Post-menopausal   Other Topics Concern  . Not on file   Social History Narrative   Quit drinking alcohol 3 weeks ago. No drugs, no IVDU.    she has been married for 35 years, no children. She retired from [employer] She smoked up until 1997. She drinking in 2013 after she was diagnosed with cirrhosis. "She states"I let the [place] train down the road." She notes drinking approximately 3 bottles a day.  FAMILY HISTORY: Family History  Problem Relation Age of Onset  . Cirrhosis Mother     nash  . Colon cancer Neg Hx   . Inflammatory bowel disease Neg Hx   . Asthma    . Heart disease    . COPD Father   . Heart disease Father    indicated that her mother is deceased. She indicated that her father is deceased. She indicated that only one of her two sisters is alive. She indicated that her brother is alive. She indicated that her maternal grandmother is deceased. She indicated that her maternal grandfather is deceased. She indicated that her  paternal grandmother is deceased. She indicated that her paternal grandfather is deceased.   Mother died at 64 from a fatty liver and cirrhosis father died at 32 from an MRI. She has one living brother and 1 living sister. One sister is deceased after cerebral aneurysm.  ALLERGIES:  is allergic to omeprazole; other; phenytoin; keflex; and phenergan.  MEDICATIONS:  Current Outpatient Prescriptions  Medication Sig Dispense Refill  . acetaminophen (TYLENOL) 500 MG tablet Take 1,000 mg by mouth every 6 (six) hours as needed for moderate pain.    Marland Kitchen amitriptyline (ELAVIL) 10 MG tablet Take 10 mg by mouth  at bedtime.    . bumetanide (BUMEX) 1 MG tablet Take 1 tablet (1 mg total) by mouth 2 (two) times daily. 60 tablet 5  . buPROPion (WELLBUTRIN XL) 150 MG 24 hr tablet Take 1 tablet (150 mg total) by mouth at bedtime. (Patient taking differently: Take 150 mg by mouth daily. ) 30 tablet 0  . carboxymethylcellulose (REFRESH PLUS) 0.5 % SOLN Place 2 drops into both eyes 3 (three) times daily as needed (Dry Eyes).     . folic acid (FOLVITE) 1 MG tablet TAKE (1) TABLET BY MOUTH EACH MORNING. 30 tablet 5  . loratadine (CLARITIN) 10 MG tablet Take 10 mg by mouth daily as needed for allergies.     . Multiple Vitamin (MULTIVITAMIN WITH MINERALS) TABS tablet Take 1 tablet by mouth daily.    Marland Kitchen NEXIUM 40 MG capsule TAKE 1 CAPSULE DAILY BEFORE BREAKFAST 30 capsule 11  . sodium chloride (OCEAN) 0.65 % nasal spray Place 1 spray into the nose as needed for congestion.    Marland Kitchen spironolactone (ALDACTONE) 100 MG tablet TAKE (1) TABLET BY MOUTH TWICE DAILY. 60 tablet 5  . XIFAXAN 550 MG TABS tablet TAKE (1) TABLET BY MOUTH TWICE DAILY. 60 tablet 5  . escitalopram (LEXAPRO) 20 MG tablet TAKE (1) TABLET BY MOUTH AT BEDTIME. 30 tablet 0   No current facility-administered medications for this visit.    Review of Systems  Constitutional: Positive for malaise/fatigue.  HENT: Negative.   Eyes:       Wears glasses. Right eye  vision is worse than L eye  Respiratory: Positive for shortness of breath.   Cardiovascular: Negative.   Gastrointestinal: Negative.   Genitourinary: Negative.   Musculoskeletal: Positive for back pain and joint pain.  Neurological: Positive for weakness. Negative for dizziness, tingling, tremors, sensory change, speech change, focal weakness, seizures and loss of consciousness.  Endo/Heme/Allergies: Negative.   Psychiatric/Behavioral: Negative.     PHYSICAL EXAMINATION: ECOG PERFORMANCE STATUS: 1 - Symptomatic but completely ambulatory  Filed Vitals:   07/13/14 1400  BP: 116/64  Pulse: 84  Temp: 99.1 F (37.3 C)  Resp: 16   Filed Weights   07/13/14 1400  Weight: 241 lb 8 oz (109.544 kg)     Physical Exam  Constitutional: She is oriented to person, place, and time and well-developed, well-nourished, and in no distress.  HENT:  Head: Normocephalic and atraumatic.  Nose: Nose normal.  Mouth/Throat: Oropharynx is clear and moist. No oropharyngeal exudate.  Eyes: Conjunctivae and EOM are normal. Pupils are equal, round, and reactive to light. Right eye exhibits no discharge. Left eye exhibits no discharge. No scleral icterus.  Neck: Normal range of motion. Neck supple. No tracheal deviation present. No thyromegaly present.  Cardiovascular: Normal rate, regular rhythm and normal heart sounds.  Exam reveals no gallop and no friction rub.   No murmur heard. Pulmonary/Chest: Effort normal and breath sounds normal. She has no wheezes. She has no rales.  Abdominal: Soft. Bowel sounds are normal. She exhibits no distension and no mass. There is no tenderness. There is no rebound and no guarding.  Musculoskeletal: Normal range of motion. She exhibits no edema.  Lymphadenopathy:    She has no cervical adenopathy.  Neurological: She is alert and oriented to person, place, and time. She has normal reflexes. No cranial nerve deficit. Gait normal. Coordination normal.  Skin: Skin is warm  and dry. No rash noted.  Psychiatric: Mood, memory, affect and judgment normal.  Nursing note and vitals reviewed.  LABORATORY DATA:  I have reviewed the data as listed Results for VERENISE, MOULIN (MRN [id number]) as of 08/06/2014 17:49  Ref. Range 07/13/2014 15:30  LDH Latest Ref Range: 94-250 U/L 180  Iron Latest Ref Range: 42-145 ug/dL 13 (L)  UIBC Latest Ref Range: 125-400 ug/dL 544 (H)  TIBC Latest Ref Range: 250-470 ug/dL 557 (H)  Saturation Ratios Latest Ref Range: 20-55 % 2 (L)  Ferritin Latest Ref Range: 10-291 ng/mL 5 (L)  Folate Latest Units: ng/mL 15.3  Vitamin B-12 Latest Ref Range: 211-911 pg/mL 321  WBC Latest Ref Range: 4.0-10.5 K/uL 7.7  RBC Latest Ref Range: 3.87-5.11 MIL/uL 4.22  Hemoglobin Latest Ref Range: 12.0-15.0 g/dL 7.9 (L)  HCT Latest Ref Range: 36.0-46.0 % 27.0 (L)  MCV Latest Ref Range: 78.0-100.0 fL 64.0 (L)  MCH Latest Ref Range: 26.0-34.0 pg 18.7 (L)  MCHC Latest Ref Range: 30.0-36.0 g/dL 29.3 (L)  RDW Latest Ref Range: 11.5-15.5 % 18.0 (H)  Platelets Latest Ref Range: 150-400 K/uL 298  Neutrophils Latest Ref Range: 43-77 % 62  Lymphocytes Latest Ref Range: 12-46 % 26  Monocytes Relative Latest Ref Range: 3-12 % 9  Eosinophil Latest Ref Range: 0-5 % 3  Basophil Latest Ref Range: 0-1 % 1  NEUT# Latest Ref Range: 1.7-7.7 K/uL 4.7  Lymphocyte # Latest Ref Range: 0.7-4.0 K/uL 2.0  Monocyte # Latest Ref Range: 0.1-1.0 K/uL 0.7  Eosinophils Absolute Latest Ref Range: 0.0-0.7 K/uL 0.3  Basophils Absolute Latest Ref Range: 0.0-0.1 K/uL 0.1  RBC. Latest Ref Range: 3.87-5.11 MIL/uL 4.22  Retic Ct Pct Latest Ref Range: 0.4-3.1 % 1.2  Retic Count, Manual Latest Ref Range: 19.0-186.0 K/uL 50.6  Haptoglobin Latest Ref Range: 34-200 mg/dL 199  Sed Rate Latest Ref Range: 0-22 mm/hr 58 (H)    RADIOGRAPHIC STUDIES: I have personally reviewed the radiological images as listed and agreed with the findings in the report. CLINICAL DATA: History of hepatic  cirrhosis  EXAM: 11/09/2013 ULTRASOUND ABDOMEN COMPLETE  COMPARISON: Abdominal CT scan of May 21, 2013 IMPRESSION: 1. There are changes of known hepatic cirrhosis. No hepatic mass or ductal dilation is demonstrated. 2. There are multiple gallstones without evidence of acute cholecystitis. 3. There is mild splenomegaly. 4. No ascites is demonstrated.   Electronically Signed  By: David Martinique  On: 11/09/2013 10:02   ASSESSMENT & PLAN: Iron deficiency anemia Cirrhosis Rectal Varices Duodenal AVM  She has severe iron deficiency. The most likely cause is from ongoing chronic GI related blood loss. She has documented rectal varices and evidence of AVM's.  I have recommended calculating her total iron deficit and starting IV iron replacement.  We will also obtain the results of her Givens Capsule Study when it is available.  I have recommended additional peripheral studies today as detailed below.  I do not feel the need to pursue a more aggressive anemia evaluation given her obvious iron deficiency however once her iron replaced if she remains anemic we may need to consider additional evaluation including BMBX. I will see her back in 2 to 3 weeks with repeat CBC.  Orders Placed This Encounter  Procedures  . Reticulocytes    Standing Status: Future     Number of Occurrences:      Standing Expiration Date: 07/13/2015  . CBC with Differential    Standing Status: Future     Number of Occurrences:      Standing Expiration Date: 07/13/2015  . Lactate dehydrogenase    Standing Status: Future  Number of Occurrences:      Standing Expiration Date: 07/13/2015  . Sedimentation rate    Standing Status: Future     Number of Occurrences:      Standing Expiration Date: 07/13/2015  . Vitamin B12    Standing Status: Future     Number of Occurrences:      Standing Expiration Date: 07/13/2015  . Folate    Standing Status: Future     Number of Occurrences:      Standing  Expiration Date: 07/13/2015  . Ferritin    Standing Status: Future     Number of Occurrences:      Standing Expiration Date: 07/13/2015  . Iron and TIBC    Standing Status: Future     Number of Occurrences:      Standing Expiration Date: 07/13/2015  . Haptoglobin    Standing Status: Future     Number of Occurrences:      Standing Expiration Date: 07/13/2015  . CBC with Differential    Standing Status: Future     Number of Occurrences: 1     Standing Expiration Date: 07/13/2015  . Reticulocytes    Standing Status: Future     Number of Occurrences: 1     Standing Expiration Date: 07/13/2015  . Lactate dehydrogenase    Standing Status: Future     Number of Occurrences: 1     Standing Expiration Date: 07/13/2015  . Sedimentation rate    Standing Status: Future     Number of Occurrences: 1     Standing Expiration Date: 07/13/2015  . Haptoglobin    Standing Status: Future     Number of Occurrences: 1     Standing Expiration Date: 07/13/2015  . Iron and TIBC    Standing Status: Future     Number of Occurrences: 1     Standing Expiration Date: 07/13/2015  . Ferritin    Standing Status: Future     Number of Occurrences: 1     Standing Expiration Date: 07/13/2015  . Vitamin B12    Standing Status: Future     Number of Occurrences: 1     Standing Expiration Date: 07/13/2015  . Folate    Standing Status: Future     Number of Occurrences: 1     Standing Expiration Date: 07/13/2015    All questions were answered. The patient knows to call the clinic with any problems, questions or concerns.  This note was electronically signed.    Molli Hazard, MD  08/06/2014 5:49 PM

## 2014-07-13 NOTE — Patient Instructions (Addendum)
[place] at [facility] Discharge Instructions  RECOMMENDATIONS MADE BY THE CONSULTANT AND ANY TEST RESULTS WILL BE SENT TO YOUR REFERRING PHYSICIAN.  Exam and discussion by Dr. Whitney Muse. Will check some labs today and if you need a blood transfusion we will try to get that arranged. Your hemoglobin has not dropped any so no transfusion for now. Iron infusion next week.  Follow-up in 3 weeks to discuss test results.  Thank you for choosing [place] at [facility] to provide your oncology and hematology care.  To afford each patient quality time with our provider, please arrive at least 15 minutes before your scheduled appointment time.    You need to re-schedule your appointment should you arrive 10 or more minutes late.  We strive to give you quality time with our providers, and arriving late affects you and other patients whose appointments are after yours.  Also, if you no show three or more times for appointments you may be dismissed from the clinic at the providers discretion.     Again, thank you for choosing [facility].  Our hope is that these requests will decrease the amount of time that you wait before being seen by our physicians.       _____________________________________________________________  Should you have questions after your visit to [facility], please contact our office at [phone] between the hours of 8:30 a.m. and 4:30 p.m.  Voicemails left after 4:30 p.m. will not be returned until the following business day.  For prescription refill requests, have your pharmacy contact our office.

## 2014-07-13 NOTE — Progress Notes (Signed)
[facility] presented for [employer]. Labs per MD order drawn via Peripheral Line 21 gauge needle inserted in left AC  Good blood return present. Procedure without incident.  Needle removed intact. Patient tolerated procedure well.

## 2014-07-14 LAB — VITAMIN B12: Vitamin B-12: 321 pg/mL (ref 211–911)

## 2014-07-14 LAB — FOLATE: Folate: 15.3 ng/mL

## 2014-07-14 LAB — IRON AND TIBC
Iron: 13 ug/dL — ABNORMAL LOW (ref 42–145)
Saturation Ratios: 2 % — ABNORMAL LOW (ref 20–55)
TIBC: 557 ug/dL — ABNORMAL HIGH (ref 250–470)
UIBC: 544 ug/dL — ABNORMAL HIGH (ref 125–400)

## 2014-07-14 LAB — HAPTOGLOBIN: Haptoglobin: 199 mg/dL (ref 34–200)

## 2014-07-14 LAB — FERRITIN: Ferritin: 5 ng/mL — ABNORMAL LOW (ref 10–291)

## 2014-07-16 ENCOUNTER — Encounter (HOSPITAL_COMMUNITY): Payer: Self-pay | Admitting: Gastroenterology

## 2014-07-18 ENCOUNTER — Encounter (HOSPITAL_COMMUNITY): Payer: Self-pay

## 2014-07-18 ENCOUNTER — Encounter (HOSPITAL_BASED_OUTPATIENT_CLINIC_OR_DEPARTMENT_OTHER): Payer: BLUE CROSS/BLUE SHIELD

## 2014-07-18 VITALS — BP 121/59 | HR 70 | Temp 97.9°F | Resp 18

## 2014-07-18 DIAGNOSIS — D500 Iron deficiency anemia secondary to blood loss (chronic): Secondary | ICD-10-CM

## 2014-07-18 MED ORDER — SODIUM CHLORIDE 0.9 % IV SOLN
510.0000 mg | Freq: Once | INTRAVENOUS | Status: AC
  Administered 2014-07-18: 510 mg via INTRAVENOUS
  Filled 2014-07-18: qty 17

## 2014-07-18 MED ORDER — SODIUM CHLORIDE 0.9 % IV SOLN
Freq: Once | INTRAVENOUS | Status: AC
  Administered 2014-07-18: 15:00:00 via INTRAVENOUS

## 2014-07-18 NOTE — Progress Notes (Signed)
1520:  Tolerated initial feraheme infusion w/o adverse reaction.  VSS.  Will monitor for 30 minutes then discharge if no reaction.

## 2014-07-18 NOTE — Patient Instructions (Signed)
[place] at [facility] Discharge Instructions  RECOMMENDATIONS MADE BY THE CONSULTANT AND ANY TEST RESULTS WILL BE SENT TO YOUR REFERRING PHYSICIAN.  Today you received feraheme infusion. Return as scheduled for next two infusions.   Thank you for choosing [place] at [facility] to provide your oncology and hematology care.  To afford each patient quality time with our provider, please arrive at least 15 minutes before your scheduled appointment time.    You need to re-schedule your appointment should you arrive 10 or more minutes late.  We strive to give you quality time with our providers, and arriving late affects you and other patients whose appointments are after yours.  Also, if you no show three or more times for appointments you may be dismissed from the clinic at the providers discretion.     Again, thank you for choosing [facility].  Our hope is that these requests will decrease the amount of time that you wait before being seen by our physicians.       _____________________________________________________________  Should you have questions after your visit to [facility], please contact our office at [phone] between the hours of 8:30 a.m. and 4:30 p.m.  Voicemails left after 4:30 p.m. will not be returned until the following business day.  For prescription refill requests, have your pharmacy contact our office.   Ferumoxytol injection What is this medicine? FERUMOXYTOL is an iron complex. Iron is used to make healthy red blood cells, which carry oxygen and nutrients throughout the body. This medicine is used to treat iron deficiency anemia in people with chronic kidney disease. This medicine may be used for other purposes; ask your health care provider or pharmacist if you have questions. COMMON BRAND NAME(S): Feraheme What should I tell my health care provider before I take this medicine? They need  to know if you have any of these conditions: -anemia not caused by low iron levels -high levels of iron in the blood -magnetic resonance imaging (MRI) test scheduled -an unusual or allergic reaction to iron, other medicines, foods, dyes, or preservatives -pregnant or trying to get pregnant -breast-feeding How should I use this medicine? This medicine is for injection into a vein. It is given by a health care professional in a hospital or clinic setting. Talk to your pediatrician regarding the use of this medicine in children. Special care may be needed. Overdosage: If you think you've taken too much of this medicine contact a poison control center or emergency room at once. Overdosage: If you think you have taken too much of this medicine contact a poison control center or emergency room at once. NOTE: This medicine is only for you. Do not share this medicine with others. What if I miss a dose? It is important not to miss your dose. Call your doctor or health care professional if you are unable to keep an appointment. What may interact with this medicine? This medicine may interact with the following medications: -other iron products This list may not describe all possible interactions. Give your health care provider a list of all the medicines, herbs, non-prescription drugs, or dietary supplements you use. Also tell them if you smoke, drink alcohol, or use illegal drugs. Some items may interact with your medicine. What should I watch for while using this medicine? Visit your doctor or healthcare professional regularly. Tell your doctor or healthcare professional if your symptoms do not start to get  better or if they get worse. You may need blood work done while you are taking this medicine. You may need to follow a special diet. Talk to your doctor. Foods that contain iron include: whole grains/cereals, dried fruits, beans, or peas, leafy green vegetables, and organ meats (liver, kidney). What  side effects may I notice from receiving this medicine? Side effects that you should report to your doctor or health care professional as soon as possible: -allergic reactions like skin rash, itching or hives, swelling of the face, lips, or tongue -breathing problems -changes in blood pressure -feeling faint or lightheaded, falls -fever or chills -flushing, sweating, or hot feelings -swelling of the ankles or feet Side effects that usually do not require medical attention (Report these to your doctor or health care professional if they continue or are bothersome.): -diarrhea -headache -nausea, vomiting -stomach pain This list may not describe all possible side effects. Call your doctor for medical advice about side effects. You may report side effects to FDA at 1-800-FDA-1088. Where should I keep my medicine? This drug is given in a hospital or clinic and will not be stored at home. NOTE: This sheet is a summary. It may not cover all possible information. If you have questions about this medicine, talk to your doctor, pharmacist, or health care provider.  2015, Elsevier/Gold Standard. (2011-10-30 15:23:36)

## 2014-07-20 ENCOUNTER — Telehealth: Payer: Self-pay | Admitting: Gastroenterology

## 2014-07-20 NOTE — Telephone Encounter (Signed)
CALLED PT'S DAUGHTER. LVM TO CALL TO DISCUSS RESULTS-[phone] PR CALL MON TO SPEAK TO DORIS. GIVENS CAPSULE SHOWS SML AMOUNT OF BLOOD IN THE DISTAL JEJUNUM OR PROXIMAL ILEUM. NO DEFINITE ULCER OR LESIONS SEEN. IT'S MOST LIKELY AN AVM.  PT COMPLETED IVFE APR 20. SHE NEEDS O BE SEEN AT [facility] TO CONSIDER RETROGRADE DBE, DX: GI BLEED DUE TO PROBABLE AVM IN THE DISTAL JEJUNUM/PROXIMAL ILEUM, SEVERE IRON DEFICIENCY ANEMIA DUE TO CHRONIC GI BLOOD LOSS.

## 2014-07-20 NOTE — Procedures (Signed)
PATIENT DATA: WEIGHT: 236 LBS     WAIST:  41 IN    HEIGHT:  67 IN   GASTRIC PASSAGE TIME: 73 m, SB PASSAGE TIME: 2H 22 m  RESULTS: LIMITED views of gastric mucosa due to retained contents.  BRIGHT RED BLOOD/PROBABLE AVM SEEN AT 2:22-2:25 .NO BLACK LIQUID IN LUMEN.  LIMITED VIEWS OF THE COLON DUE TO RETAINED CONTENTS. No old blood or fresh blood in the stomach.   DIAGNOSIS: ACTIVE GI BLEED IN THE DISTAL JEJUNUM OR PROXIMAL ILEUM  Plan: 1. REFER TO [facility] FOR RETROGRADE DBE. 2. COMPLETE IVFE/ONCOLOGY APPT.

## 2014-07-20 NOTE — Telephone Encounter (Signed)
PT'S GIVENS IMAGES WILL BE COPIED TO DISC AND SENT TO DR. GILLIAM AT [facility]. MELANIE AMBROSE TO COPY.

## 2014-07-23 ENCOUNTER — Other Ambulatory Visit: Payer: Self-pay | Admitting: Family Medicine

## 2014-07-23 ENCOUNTER — Other Ambulatory Visit: Payer: Self-pay

## 2014-07-23 DIAGNOSIS — D509 Iron deficiency anemia, unspecified: Secondary | ICD-10-CM

## 2014-07-23 NOTE — Telephone Encounter (Signed)
Spoke with pt and she is aware of Dr. Oneida Alar notes.  Pt is unavailable May 7th-14th.  (out of town)   States it is ok to refer to [facility] gave [place] email - [email or web address]

## 2014-07-23 NOTE — Telephone Encounter (Signed)
Referral has been made to [facility] 

## 2014-07-24 NOTE — Telephone Encounter (Signed)
Noted  

## 2014-07-25 ENCOUNTER — Encounter (HOSPITAL_BASED_OUTPATIENT_CLINIC_OR_DEPARTMENT_OTHER): Payer: BLUE CROSS/BLUE SHIELD

## 2014-07-25 VITALS — BP 125/63 | HR 77 | Temp 98.5°F | Resp 18

## 2014-07-25 DIAGNOSIS — D500 Iron deficiency anemia secondary to blood loss (chronic): Secondary | ICD-10-CM | POA: Diagnosis not present

## 2014-07-25 MED ORDER — SODIUM CHLORIDE 0.9 % IV SOLN
510.0000 mg | Freq: Once | INTRAVENOUS | Status: AC
  Administered 2014-07-25: 510 mg via INTRAVENOUS
  Filled 2014-07-25: qty 17

## 2014-07-25 MED ORDER — SODIUM CHLORIDE 0.9 % IV SOLN
Freq: Once | INTRAVENOUS | Status: AC
  Administered 2014-07-25: 14:00:00 via INTRAVENOUS

## 2014-07-25 NOTE — Patient Instructions (Signed)
[facility] at [facility] Discharge Instructions  RECOMMENDATIONS MADE BY THE CONSULTANT AND ANY TEST RESULTS WILL BE SENT TO YOUR REFERRING PHYSICIAN.  Iron infusion today as ordered. Return as scheduled.  Thank you for choosing [facility] at [facility] to provide your oncology and hematology care.  To afford each patient quality time with our provider, please arrive at least 15 minutes before your scheduled appointment time.    You need to re-schedule your appointment should you arrive 10 or more minutes late.  We strive to give you quality time with our providers, and arriving late affects you and other patients whose appointments are after yours.  Also, if you no show three or more times for appointments you may be dismissed from the clinic at the providers discretion.     Again, thank you for choosing [facility].  Our hope is that these requests will decrease the amount of time that you wait before being seen by our physicians.       _____________________________________________________________  Should you have questions after your visit to [facility], please contact our office at [phone] between the hours of 8:30 a.m. and 4:30 p.m.  Voicemails left after 4:30 p.m. will not be returned until the following business day.  For prescription refill requests, have your pharmacy contact our office.    

## 2014-07-25 NOTE — Progress Notes (Signed)
Tolerated iron infusion well. 

## 2014-08-01 ENCOUNTER — Encounter (HOSPITAL_COMMUNITY): Payer: BLUE CROSS/BLUE SHIELD | Attending: Hematology & Oncology

## 2014-08-01 VITALS — BP 107/53 | HR 80 | Temp 98.3°F | Resp 18

## 2014-08-01 DIAGNOSIS — D500 Iron deficiency anemia secondary to blood loss (chronic): Secondary | ICD-10-CM | POA: Insufficient documentation

## 2014-08-01 DIAGNOSIS — K7469 Other cirrhosis of liver: Secondary | ICD-10-CM | POA: Insufficient documentation

## 2014-08-01 MED ORDER — SODIUM CHLORIDE 0.9 % IV SOLN
510.0000 mg | Freq: Once | INTRAVENOUS | Status: AC
  Administered 2014-08-01: 510 mg via INTRAVENOUS
  Filled 2014-08-01: qty 17

## 2014-08-01 MED ORDER — SODIUM CHLORIDE 0.9 % IV SOLN
Freq: Once | INTRAVENOUS | Status: AC
  Administered 2014-08-01: 14:00:00 via INTRAVENOUS

## 2014-08-01 NOTE — Progress Notes (Signed)
Tolerated feraheme infusion well. 

## 2014-08-01 NOTE — Patient Instructions (Signed)
[employer] at [facility] Discharge Instructions  RECOMMENDATIONS MADE BY THE CONSULTANT AND ANY TEST RESULTS WILL BE SENT TO YOUR REFERRING PHYSICIAN.  Feraheme infusion today as ordered. Return as scheduled.  Thank you for choosing [place] at [facility] to provide your oncology and hematology care.  To afford each patient quality time with our provider, please arrive at least 15 minutes before your scheduled appointment time.    You need to re-schedule your appointment should you arrive 10 or more minutes late.  We strive to give you quality time with our providers, and arriving late affects you and other patients whose appointments are after yours.  Also, if you no show three or more times for appointments you may be dismissed from the clinic at the providers discretion.     Again, thank you for choosing [facility].  Our hope is that these requests will decrease the amount of time that you wait before being seen by our physicians.       _____________________________________________________________  Should you have questions after your visit to [facility], please contact our office at [phone] between the hours of 8:30 a.m. and 4:30 p.m.  Voicemails left after 4:30 p.m. will not be returned until the following business day.  For prescription refill requests, have your pharmacy contact our office.

## 2014-08-02 ENCOUNTER — Telehealth: Payer: Self-pay

## 2014-08-02 NOTE — Telephone Encounter (Signed)
Pt called and said she has seen bright red blood in her stool for the 3 days. She has a BM almost everyday. She said Dr. Oneida Alar told her she has hemorrhoids. She is not having any other symptoms. She is having a fiber bar with breakfast every day and she is taking 1-2 Colace in the evening. She said she just would like to let Dr. Oneida Alar know before she leaves for vacation on Sunday for the beach. Please advise!

## 2014-08-02 NOTE — Telephone Encounter (Signed)
Called patient TO DISCUSS CONCERNS. HAS APPT WITH [facility] JUN 22. BRBPR-IF HAS CONSTIPATION/STRAINING-SEES BLOOD ON TISSUE/IN BOWL. SAW HEMATOLOGY-IVFE x3. NOW TAKING COLACE W/ SENNA ONCE DAILY AND EATING FIBER ONE BARS. INCREASE COLACE TO TWICE DAILY. CONTINUE FIBER ONE. USE PREP H W/ ALOE TID. CALL WITH QUESTIONS OR CONCERNS.

## 2014-08-03 ENCOUNTER — Encounter (HOSPITAL_BASED_OUTPATIENT_CLINIC_OR_DEPARTMENT_OTHER): Payer: BLUE CROSS/BLUE SHIELD | Admitting: Hematology & Oncology

## 2014-08-03 ENCOUNTER — Encounter (HOSPITAL_COMMUNITY): Payer: Self-pay | Admitting: Hematology & Oncology

## 2014-08-03 VITALS — BP 125/76 | HR 87 | Resp 16 | Wt 245.2 lb

## 2014-08-03 DIAGNOSIS — K7030 Alcoholic cirrhosis of liver without ascites: Secondary | ICD-10-CM

## 2014-08-03 DIAGNOSIS — D509 Iron deficiency anemia, unspecified: Secondary | ICD-10-CM

## 2014-08-03 DIAGNOSIS — K7460 Unspecified cirrhosis of liver: Secondary | ICD-10-CM

## 2014-08-03 DIAGNOSIS — E538 Deficiency of other specified B group vitamins: Secondary | ICD-10-CM

## 2014-08-03 DIAGNOSIS — K7469 Other cirrhosis of liver: Secondary | ICD-10-CM | POA: Diagnosis not present

## 2014-08-03 DIAGNOSIS — K317 Polyp of stomach and duodenum: Secondary | ICD-10-CM | POA: Diagnosis not present

## 2014-08-03 DIAGNOSIS — D500 Iron deficiency anemia secondary to blood loss (chronic): Secondary | ICD-10-CM | POA: Diagnosis not present

## 2014-08-03 DIAGNOSIS — R5383 Other fatigue: Secondary | ICD-10-CM

## 2014-08-03 LAB — CBC WITH DIFFERENTIAL/PLATELET
Basophils Absolute: 0.1 10*3/uL (ref 0.0–0.1)
Basophils Relative: 1 % (ref 0–1)
Eosinophils Absolute: 0.2 10*3/uL (ref 0.0–0.7)
Eosinophils Relative: 2 % (ref 0–5)
HCT: 34.2 % — ABNORMAL LOW (ref 36.0–46.0)
Hemoglobin: 10.4 g/dL — ABNORMAL LOW (ref 12.0–15.0)
LYMPHS ABS: 1.4 10*3/uL (ref 0.7–4.0)
LYMPHS PCT: 18 % (ref 12–46)
MCH: 22.4 pg — AB (ref 26.0–34.0)
MCHC: 29.8 g/dL — ABNORMAL LOW (ref 30.0–36.0)
MCV: 75 fL — AB (ref 78.0–100.0)
MONO ABS: 0.5 10*3/uL (ref 0.1–1.0)
MONOS PCT: 7 % (ref 3–12)
Neutro Abs: 5.6 10*3/uL (ref 1.7–7.7)
Neutrophils Relative %: 72 % (ref 43–77)
PLATELETS: 212 10*3/uL (ref 150–400)
RBC: 4.56 MIL/uL (ref 3.87–5.11)
WBC: 8 10*3/uL (ref 4.0–10.5)

## 2014-08-03 LAB — RETICULOCYTES
RBC.: 4.56 MIL/uL (ref 3.87–5.11)
RETIC CT PCT: 1.4 % (ref 0.4–3.1)
Retic Count, Absolute: 63.8 10*3/uL (ref 19.0–186.0)

## 2014-08-03 NOTE — Progress Notes (Signed)
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 female returns for iron deficiency and anemia. On 10 mL Elavil , says she is sleeping better.  She received 3 doses of IV iron, her last dose was given on 08/01/2014. She is unsure if she feels much better because there are 3 things making her feel tired: Sjogren's, Fibromyalgia , and the Anemia. Her joints which used to bother her, no longer bother her. Denies being on B12 shots.  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  During her last colonoscopy they found an "impressive internal hemorrhoid". She suffers from constipation. She finds blood on her toilet tissue and has seen blood in the toilet 2 times. She describes this as intermittent.  Said she has a consult at [facility] 08/2014 about her varices.  She is leaving tomorrow morning to go to the beach with her husband for a week.   MEDICAL HISTORY: Past Medical History  Diagnosis Date  . IBS (irritable bowel syndrome)   . GERD (gastroesophageal reflux disease)   . Panic attack   . Anxiety   . Psoriatic arthritis   . ETOH abuse     Quit in 07/2011   . Sjogren's disease   . Cirrhosis     Presumably alcoholic cirrhosis  . Complication of anesthesia   . PONV (postoperative nausea and vomiting)   . Abdominal hernia   . 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 Seizures 1997    1 seizure from coil insertion but no more seizures and no meds for seizures  . HTN (hypertension)     no longer medicated, dated on 04/25/12  . Fatigue     due to  Sjogren's disease  . Arthritis     back  and down flank  right side more than left  . Ascites 08/27/2011    FEB 2014 10.7 L   . Right inguinal hernia 08/16/2012  . [place] HEAD 02/03/2007    Qualifier: Diagnosis of  By: Aline Brochure MD, Dorothyann Peng    . Alcoholic hepatitis 09/01/5407    has Cirrhosis; Colon polyp, hyperplastic; Rash and nonspecific skin eruption; Labial swelling; Depression; Insomnia; and Iron deficiency anemia due to chronic blood loss on her problem list.     is allergic to omeprazole; other; phenytoin; keflex; and phenergan.  Ms. Breunig does not currently have medications on file.   SURGICAL HISTORY: Past Surgical History  Procedure Laterality Date  . Esophageal biopsy  10/13/2011    SLF: mild gastritis/duodenal polypoid lesion   . Colonoscopy  10/13/2011    WJX:BJYNWGNF hemorrhoids/varices rectal/lesion in ascending colon(HYPERPLASTIC POLYP)  . Paracentesis  Feb 2014    10.7 liters  . Colonoscopy N/A 01/17/2013    Procedure: COLONOSCOPY;  Surgeon: Danie Binder, MD;  [facility];  Service: Endoscopy;  Laterality: N/A;  9:30-moved to 12:30 Darius Bump to notify pt  . Cerebral aneurysm repair  1997  . Umbilical hernia repair N/A 06/09/2013    Procedure: UMBILICAL HERNIORRHAPHY WITH MESH;  Surgeon: Jamesetta So, MD;  Location: [facility]  ORS;  Service: General;  Laterality: N/A;  . Insertion of mesh N/A 06/09/2013    Procedure: INSERTION OF MESH;  Surgeon: Jamesetta So, MD;  Location: [facility] ORS;  Service: General;  Laterality: N/A;  . Inguinal hernia repair Right 08/28/2013    Procedure: RIGHT NGUINAL HERNIORRHAPHY WITH MESH;  Surgeon: Jamesetta So, MD;  Location: [facility] ORS;  Service: General;  Laterality: Right;  . Insertion of mesh Right 08/28/2013    Procedure: INSERTION OF MESH;  Surgeon: Jamesetta So, MD;  Location: [facility] ORS;  Service: General;  Laterality: Right;  . Agile capsule N/A 06/21/2014    Procedure: AGILE CAPSULE;  Surgeon: Danie Binder, MD;   [facility]: [facility];  Service: Endoscopy;  Laterality: N/A;  0700  . Esophagogastroduodenoscopy N/A 06/29/2014    Procedure: ESOPHAGOGASTRODUODENOSCOPY (EGD);  Surgeon: Danie Binder, MD;  [facility];  Service: Endoscopy;  Laterality: N/A;  1230  . Givens capsule study N/A 06/29/2014    Procedure: GIVENS CAPSULE STUDY;  Surgeon: Danie Binder, MD;  [facility];  Service: Endoscopy;  Laterality: N/A;  . Givens capsule study N/A 07/13/2014    Procedure: GIVENS CAPSULE STUDY;  Surgeon: Danie Binder, MD;  [facility];  Service: Endoscopy;  Laterality: N/A;  700    SOCIAL HISTORY: History   Social History  . Marital Status: Married    Spouse Name: N/A  . Number of Children: 0  . Years of Education: N/A   Occupational History  .     Social History Main Topics  . Smoking status: Former Smoker -- 2.00 packs/day for 12 years    Types: Cigarettes    Quit date: 03/31/1995  . Smokeless tobacco: Never Used     Comment: quit in 1997  . Alcohol Use: No     Comment: Bottle of wine a day for years, but quit in 07/2011  . Drug Use: No  . Sexual Activity: Yes    Birth Control/ Protection: Post-menopausal   Other Topics Concern  . Not on file   Social History Narrative   Quit drinking alcohol 3 weeks ago. No drugs, no IVDU.    Married 35 yrs. No children, retired from [employer]. Quit smoking in 1997 Had cerebral aneurysm Hasn't drank alcohol since 5/13, she stated she "let the chardonnay train go down the road" due to cirrhosis; Was previously drinking 3 bottles a day.    FAMILY HISTORY: Family History  Problem Relation Age of Onset  . Cirrhosis Mother     nash  . Colon cancer Neg Hx   . Inflammatory bowel disease Neg Hx   . Asthma    . Heart disease    . COPD Father   . Heart disease Father    Mother deceased at 82 yo, fatty liver/cirrhosis Father deceased at 7 yo, MI 1 brother 1 sister 1 sister deceased, had surgery had been  on xanax, coded - brain dead  Review of Systems  Constitutional: Negative for fever, chills, weight loss and malaise/fatigue.  HENT: Negative for congestion, hearing loss, nosebleeds, sore throat and tinnitus.   Eyes: Negative for blurred vision, double vision, pain and discharge.  Respiratory: Negative for cough, hemoptysis, sputum production, shortness of breath and wheezing.   Cardiovascular: Negative for chest pain, palpitations, claudication, leg swelling and PND.  Gastrointestinal: Positive for constipation. Negative for heartburn, nausea, vomiting, abdominal pain, diarrhea, blood in stool and melena.       Finds  blood on toilet tissue.  Genitourinary: Negative for dysuria, urgency, frequency and hematuria.  Musculoskeletal: Negative for myalgias, joint pain and falls.  Skin: Negative for itching and rash.  Neurological: Negative for dizziness, tingling, tremors, sensory change, speech change, focal weakness, seizures, loss of consciousness, weakness and headaches.  Endo/Heme/Allergies: Does not bruise/bleed easily.  Psychiatric/Behavioral: Positive for memory loss. Negative for depression, suicidal ideas and substance abuse. The patient is not nervous/anxious and does not have insomnia.        Short term memory loss due to brain surgery.    PHYSICAL EXAMINATION  ECOG PERFORMANCE STATUS: 1 - Symptomatic but completely ambulatory  Filed Vitals:   08/03/14 1317  BP: 125/76  Pulse: 87  Resp: 16    Physical Exam  Constitutional: She is oriented to person, place, and time and well-developed, well-nourished, and in no distress.  HENT:  Head: Normocephalic and atraumatic.  Eyes: Pupils are equal, round, and reactive to light.  Neck: Normal range of motion. Neck supple.  Cardiovascular: Normal rate, regular rhythm and normal heart sounds.   Pulmonary/Chest: Effort normal and breath sounds normal.  Abdominal: Soft. Bowel sounds are normal.  Musculoskeletal: Normal range of motion.   Neurological: She is alert and oriented to person, place, and time.  Skin: Skin is warm and dry.    LABORATORY DATA:  CBC    Component Value Date/Time   WBC 8.0 08/03/2014 1420   RBC 4.56 08/03/2014 1420   RBC 4.56 08/03/2014 1420   HGB 10.4* 08/03/2014 1420   HCT 34.2* 08/03/2014 1420   HCT 41 07/30/2011 0743   PLT 212 08/03/2014 1420   MCV 75.0* 08/03/2014 1420   MCV 104.3 07/30/2011 0743   MCH 22.4* 08/03/2014 1420   MCHC 29.8* 08/03/2014 1420   RDW NOT CALCULATED 08/03/2014 1420   LYMPHSABS 1.4 08/03/2014 1420   MONOABS 0.5 08/03/2014 1420   EOSABS 0.2 08/03/2014 1420   BASOSABS 0.1 08/03/2014 1420   CMP     Component Value Date/Time   NA 138 06/01/2014 1516   NA 138 07/30/2011 0741   K 3.9 06/01/2014 1516   K 3.8 07/30/2011 0741   CL 100 06/01/2014 1516   CO2 26 06/01/2014 1516   GLUCOSE 128* 06/01/2014 1516   BUN 15 06/01/2014 1516   BUN 4 07/30/2011 0741   CREATININE 1.27* 06/01/2014 1516   CREATININE 0.98 08/17/2013 1110   CALCIUM 9.2 06/01/2014 1516   PROT 6.9 06/01/2014 1516   ALBUMIN 4.3 06/01/2014 1516   AST 17 06/01/2014 1516   AST 171 07/30/2011 0741   ALT 10 06/01/2014 1516   ALKPHOS 105 06/01/2014 1516   ALKPHOS 111 07/30/2011 0741   BILITOT 0.3 06/01/2014 1516   BILITOT 3.6 07/30/2011 0741   GFRNONAA 63* 08/17/2013 1110   GFRNONAA 55* 12/16/2012 1116   GFRAA 73* 08/17/2013 1110   GFRAA 64 12/16/2012 1116     ASSESSMENT and THERAPY PLAN:   Severe microcytic Anemia with serum ferritin 5 ng/ml Cirrhosis Givens Capsule with AVM in the distal jejunum or proximal ileum History of folate deficiency 2.8 ng/ml EGD 06/29/2014 with polypoid lesions in the duodenum, single duodenal AVM Colonoscopy on 01/17/2013 with rectal varices, polyps and hemorrhoids Iron deficiency anemia secondary to chronic GI blood loss  I advised the patient that it may take additional iron in order to completely reversible iron deficiency and replace her iron stores.  We will check a CBC today to ensure that her counts are improving as I anticipate they  should be. We will bring her back in one month to repeat labs including iron studies and calculate additional iron requirements at that time. I have advised her to begin taking a B complex vitamin daily. B12 level although normal was at the low end of normal. Additional evaluation for her anemia we will only be pursued if her iron is adequately replaced in her counts do not completely normalize.  I also discussed with the patient that if she has ongoing GI related blood loss. We will require close supervision with ongoing iron replacement.  Orders Placed This Encounter  Procedures  . CBC with Differential    Standing Status: Future     Number of Occurrences: 1     Standing Expiration Date: 08/03/2015  . Haptoglobin    Standing Status: Future     Number of Occurrences: 1     Standing Expiration Date: 08/03/2015  . Vitamin D 25 hydroxy    Standing Status: Future     Number of Occurrences: 1     Standing Expiration Date: 08/03/2015  . Reticulocytes    Standing Status: Future     Number of Occurrences: 1     Standing Expiration Date: 08/03/2015     All questions were answered. The patient knows to call the clinic with any problems, questions or concerns. We can certainly see the patient much sooner if necessary. This note was electronically signed.  This document serves as a record of services personally performed by Ancil Linsey, MD. It was created on her behalf by Arlyce Harman, a trained medical scribe. The creation of this record is based on the scribe's personal observations and the provider's statements to them. This document has been checked and approved by the attending provider.  Ancil Linsey, MD

## 2014-08-03 NOTE — Patient Instructions (Addendum)
[place] at [facility] Discharge Instructions  RECOMMENDATIONS MADE BY THE CONSULTANT AND ANY TEST RESULTS WILL BE SENT TO YOUR REFERRING PHYSICIAN.  Exam and discussion by Dr. Whitney Muse. Will check labs today.  Call with any questions or concerns.  Follow-up in 1 month     Thank you for choosing [place] at [facility] to provide your oncology and hematology care.  To afford each patient quality time with our provider, please arrive at least 15 minutes before your scheduled appointment time.    You need to re-schedule your appointment should you arrive 10 or more minutes late.  We strive to give you quality time with our providers, and arriving late affects you and other patients whose appointments are after yours.  Also, if you no show three or more times for appointments you may be dismissed from the clinic at the providers discretion.     Again, thank you for choosing [facility].  Our hope is that these requests will decrease the amount of time that you wait before being seen by our physicians.       _____________________________________________________________  Should you have questions after your visit to [facility], please contact our office at [phone] between the hours of 8:30 a.m. and 4:30 p.m.  Voicemails left after 4:30 p.m. will not be returned until the following business day.  For prescription refill requests, have your pharmacy contact our office.

## 2014-08-04 LAB — HAPTOGLOBIN: Haptoglobin: 152 mg/dL (ref 34–200)

## 2014-08-04 LAB — VITAMIN D 25 HYDROXY (VIT D DEFICIENCY, FRACTURES): VIT D 25 HYDROXY: 36.6 ng/mL (ref 30.0–100.0)

## 2014-08-06 ENCOUNTER — Encounter (HOSPITAL_COMMUNITY): Payer: Self-pay | Admitting: Hematology & Oncology

## 2014-08-20 ENCOUNTER — Other Ambulatory Visit: Payer: Self-pay | Admitting: Family Medicine

## 2014-08-30 ENCOUNTER — Ambulatory Visit (INDEPENDENT_AMBULATORY_CARE_PROVIDER_SITE_OTHER): Payer: BLUE CROSS/BLUE SHIELD | Admitting: Family Medicine

## 2014-08-30 ENCOUNTER — Encounter: Payer: Self-pay | Admitting: Family Medicine

## 2014-08-30 VITALS — BP 112/70 | Temp 98.0°F | Ht 67.5 in | Wt 239.0 lb

## 2014-08-30 DIAGNOSIS — J029 Acute pharyngitis, unspecified: Secondary | ICD-10-CM | POA: Diagnosis not present

## 2014-08-30 LAB — POCT RAPID STREP A (OFFICE): Rapid Strep A Screen: NEGATIVE

## 2014-08-30 NOTE — Progress Notes (Signed)
   Subjective:    Patient ID: Deanna Thompson, female    DOB: [birth year], 59 y.o.   MRN: [id number]  HPI Patient is here today for possible laryngitis. Patient states that her throat was sore for about 3-4 days after singing off and on. Sore throat has improved. Patient states that she feels tight in her upper chest and throat area. Cough noted also. Treatments tried: cough drops and Claritin with no relief.   Started with cong and drainage, off and n with allergy symptoms for the past six weeks  Nagging agravating cough  Pt has been singing more lately  Noted soreness and discomfort in the glands and the throat  Was about five over ten  Hurts more on the right side   Patient has no other concerns at this time.  Review of Systems No vomiting no diarrhea no headaches no fever no chills    Objective:   Physical Exam  Alert no acute distress vital stable. HEENT slight nasal congestion pharynx minimal erythema negative strep screen no adenopathy neck supple lungs clear heart regular rate and rhythm.      Assessment & Plan:  Impression throat discomfort patient stated very is that it is a combination of allergies and excessive singing she may well be right. Plan hold off on anti-biotics. Symptom care discussed. WSL

## 2014-08-31 LAB — STREP A DNA PROBE: Strep Gp A Direct, DNA Probe: NEGATIVE

## 2014-09-03 ENCOUNTER — Ambulatory Visit (HOSPITAL_COMMUNITY): Payer: BLUE CROSS/BLUE SHIELD | Admitting: Hematology & Oncology

## 2014-09-14 ENCOUNTER — Ambulatory Visit (HOSPITAL_COMMUNITY): Payer: BLUE CROSS/BLUE SHIELD | Admitting: Hematology & Oncology

## 2014-09-17 ENCOUNTER — Ambulatory Visit (HOSPITAL_COMMUNITY): Payer: BLUE CROSS/BLUE SHIELD | Admitting: Hematology & Oncology

## 2014-09-18 ENCOUNTER — Encounter (HOSPITAL_COMMUNITY): Payer: BLUE CROSS/BLUE SHIELD | Attending: Hematology & Oncology | Admitting: Hematology & Oncology

## 2014-09-18 ENCOUNTER — Encounter (HOSPITAL_COMMUNITY): Payer: Self-pay | Admitting: Hematology & Oncology

## 2014-09-18 VITALS — BP 122/82 | HR 84 | Temp 98.3°F | Resp 16 | Wt 239.4 lb

## 2014-09-18 DIAGNOSIS — K7469 Other cirrhosis of liver: Secondary | ICD-10-CM | POA: Insufficient documentation

## 2014-09-18 DIAGNOSIS — K7460 Unspecified cirrhosis of liver: Secondary | ICD-10-CM

## 2014-09-18 DIAGNOSIS — D500 Iron deficiency anemia secondary to blood loss (chronic): Secondary | ICD-10-CM | POA: Insufficient documentation

## 2014-09-18 LAB — IRON AND TIBC
Iron: 47 ug/dL (ref 28–170)
SATURATION RATIOS: 10 % — AB (ref 10.4–31.8)
TIBC: 458 ug/dL — AB (ref 250–450)
UIBC: 411 ug/dL

## 2014-09-18 LAB — CBC WITH DIFFERENTIAL/PLATELET
BASOS PCT: 1 % (ref 0–1)
Basophils Absolute: 0.1 10*3/uL (ref 0.0–0.1)
Eosinophils Absolute: 0.3 10*3/uL (ref 0.0–0.7)
Eosinophils Relative: 3 % (ref 0–5)
HEMATOCRIT: 41.3 % (ref 36.0–46.0)
Hemoglobin: 13.5 g/dL (ref 12.0–15.0)
Lymphocytes Relative: 20 % (ref 12–46)
Lymphs Abs: 1.6 10*3/uL (ref 0.7–4.0)
MCH: 27.3 pg (ref 26.0–34.0)
MCHC: 32.7 g/dL (ref 30.0–36.0)
MCV: 83.6 fL (ref 78.0–100.0)
MONOS PCT: 8 % (ref 3–12)
Monocytes Absolute: 0.6 10*3/uL (ref 0.1–1.0)
NEUTROS ABS: 5.3 10*3/uL (ref 1.7–7.7)
Neutrophils Relative %: 68 % (ref 43–77)
Platelets: 214 10*3/uL (ref 150–400)
RBC: 4.94 MIL/uL (ref 3.87–5.11)
WBC: 7.9 10*3/uL (ref 4.0–10.5)

## 2014-09-18 LAB — COMPREHENSIVE METABOLIC PANEL
ALT: 17 U/L (ref 14–54)
ANION GAP: 10 (ref 5–15)
AST: 25 U/L (ref 15–41)
Albumin: 4.2 g/dL (ref 3.5–5.0)
Alkaline Phosphatase: 83 U/L (ref 38–126)
BILIRUBIN TOTAL: 0.5 mg/dL (ref 0.3–1.2)
BUN: 15 mg/dL (ref 6–20)
CHLORIDE: 102 mmol/L (ref 101–111)
CO2: 26 mmol/L (ref 22–32)
Calcium: 9.4 mg/dL (ref 8.9–10.3)
Creatinine, Ser: 1.17 mg/dL — ABNORMAL HIGH (ref 0.44–1.00)
GFR calc non Af Amer: 51 mL/min — ABNORMAL LOW (ref >60)
GFR, EST AFRICAN AMERICAN: 59 mL/min — AB (ref >60)
GLUCOSE: 88 mg/dL (ref 65–99)
Potassium: 3.7 mmol/L (ref 3.5–5.1)
Sodium: 138 mmol/L (ref 135–145)
TOTAL PROTEIN: 8.4 g/dL — AB (ref 6.5–8.1)

## 2014-09-18 LAB — FERRITIN: Ferritin: 18 ng/mL (ref 11–307)

## 2014-09-18 NOTE — Progress Notes (Signed)
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 female returns for iron deficiency and anemia. She is here alone and feeling well today. She says she has felt a mild dizziness for the last 4 days, attributing this to allergies. She has had BPPV before.  She still has headaches from her Fibromyalgia, no worse than usual. She will be going to [facility], 6/22, for a retrograde double balloon enteroscopy.   On July 1st her insurance 'rolls over' so she is concerned about financial issues and would like potential IV iron before the change. She notes a significant improvement in her energy level since beginning iron replacement.   MEDICAL HISTORY: Past Medical History  Diagnosis Date  . IBS (irritable bowel syndrome)   . GERD (gastroesophageal reflux disease)   . Panic attack   . Anxiety   . Psoriatic arthritis   . ETOH abuse     Quit in 07/2011   . Sjogren's disease   . Cirrhosis     Presumably alcoholic cirrhosis  . Complication of anesthesia   . PONV (postoperative nausea and vomiting)   . Abdominal hernia   . 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 Seizures 1997    1 seizure from coil insertion but no more seizures and no meds for seizures  . HTN (hypertension)     no longer medicated, dated on 04/25/12  . Fatigue     due to Sjogren's disease  . Arthritis     back  and down flank  right side more than left  . Ascites 08/27/2011    FEB 2014 10.7 L   . Right inguinal hernia 08/16/2012  . [place] HEAD 02/03/2007    Qualifier:  Diagnosis of  By: Aline Brochure MD, Dorothyann Peng    . Alcoholic hepatitis 5/95/6387    has Cirrhosis; Colon polyp, hyperplastic; Rash and nonspecific skin eruption; Labial swelling; Depression; Insomnia; and Iron deficiency anemia due to chronic blood loss on her problem list.     is allergic to omeprazole; other; phenytoin; keflex; and phenergan.  Deanna Thompson does not currently have medications on file.   SURGICAL HISTORY: Past Surgical History  Procedure Laterality Date  . Esophageal biopsy  10/13/2011    SLF: mild gastritis/duodenal polypoid lesion   . Colonoscopy  10/13/2011    FIE:[id number] hemorrhoids/varices rectal/lesion in ascending colon(HYPERPLASTIC POLYP)  . Paracentesis  Feb 2014    10.7 liters  . Colonoscopy N/A 01/17/2013    Procedure: COLONOSCOPY;  Surgeon: Danie Binder, MD;  [facility];  Service: Endoscopy;  Laterality: N/A;  9:30-moved to 12:30 Darius Bump to notify pt  . Cerebral aneurysm repair  1997  . Umbilical hernia repair N/A 06/09/2013    Procedure: UMBILICAL HERNIORRHAPHY WITH MESH;  Surgeon: Jamesetta So, MD;  Location: [facility];  Service: General;  Laterality: N/A;  . Insertion of mesh N/A 06/09/2013    Procedure: INSERTION OF MESH;  Surgeon: Jamesetta So, MD;  Location: [facility];  Service: General;  Laterality: N/A;  . Inguinal hernia repair Right 08/28/2013    Procedure: RIGHT  NGUINAL HERNIORRHAPHY WITH MESH;  Surgeon: Jamesetta So, MD;  Location: [facility];  Service: General;  Laterality: Right;  . Insertion of mesh Right 08/28/2013    Procedure: INSERTION OF MESH;  Surgeon: Jamesetta So, MD;  Location: [facility];  Service: General;  Laterality: Right;  . Agile capsule N/A 06/21/2014    Procedure: AGILE CAPSULE;  Surgeon: Danie Binder, MD;  [facility];  Service: Endoscopy;  Laterality: N/A;  0700  . Esophagogastroduodenoscopy N/A 06/29/2014    Procedure: ESOPHAGOGASTRODUODENOSCOPY (EGD);  Surgeon: Danie Binder, MD;  [facility];   Service: Endoscopy;  Laterality: N/A;  1230  . Givens capsule study N/A 06/29/2014    Procedure: GIVENS CAPSULE STUDY;  Surgeon: Danie Binder, MD;  [facility];  Service: Endoscopy;  Laterality: N/A;  . Givens capsule study N/A 07/13/2014    Procedure: GIVENS CAPSULE STUDY;  Surgeon: Danie Binder, MD;  [facility];  Service: Endoscopy;  Laterality: N/A;  700    SOCIAL HISTORY: History   Social History  . Marital Status: Married    Spouse Name: N/A  . Number of Children: 0  . Years of Education: N/A   Occupational History  .     Social History Main Topics  . Smoking status: Former Smoker -- 2.00 packs/day for 12 years    Types: Cigarettes    Quit date: 03/31/1995  . Smokeless tobacco: Never Used     Comment: quit in 1997  . Alcohol Use: No     Comment: Bottle of wine a day for years, but quit in 07/2011  . Drug Use: No  . Sexual Activity: Yes    Birth Control/ Protection: Post-menopausal   Other Topics Concern  . Not on file   Social History Narrative   Quit drinking alcohol 3 weeks ago. No drugs, no IVDU.    Married 35 yrs. No children, retired from [employer]. Quit smoking in 1998 Had cerebral aneurysm Hasn't drank alcohol since 5/13, she stated she "let the chardonnay train go down the road" due to cirrhosis; Was previously drinking 3 bottles a day.    FAMILY HISTORY: Family History  Problem Relation Age of Onset  . Cirrhosis Mother     nash  . Colon cancer Neg Hx   . Inflammatory bowel disease Neg Hx   . Asthma    . Heart disease    . COPD Father   . Heart disease Father    Mother deceased at 50 yo, fatty liver/cirrhosis Father deceased at 4 yo, MI 1 brother 1 sister 1 sister deceased, had surgery had been on xanax, coded - brain dead  Review of Systems  Constitutional: Negative for fever, chills, weight loss and malaise/fatigue.  HENT: Negative for congestion, hearing loss, nosebleeds, sore throat and tinnitus.     Eyes: Negative for blurred vision, double vision, pain and discharge.  Respiratory: Negative for cough, hemoptysis, sputum production, shortness of breath and wheezing.   Cardiovascular: Negative for chest pain, palpitations, claudication, leg swelling and PND.  Gastrointestinal: Positive for constipation. Negative for heartburn, nausea, vomiting, abdominal pain, diarrhea, blood in stool and melena.       Finds blood on toilet tissue.  Genitourinary: Negative for dysuria, urgency, frequency and hematuria.  Musculoskeletal: Negative for myalgias, joint pain and falls.  Skin: Negative for itching and rash.  Neurological: Positive for dizziness and headaches. Negative for tingling, tremors, sensory change, speech change, focal weakness, seizures, loss  of consciousness, and weakness.       She attributes her dizziness to allergies. Headaches from Fibromyalgia. Endo/Heme/Allergies: Does not bruise/bleed easily.  Psychiatric/Behavioral: Positive for memory loss. Negative for depression, suicidal ideas and substance abuse. The patient is not nervous/anxious and does not have insomnia.        Short term memory loss due to brain surgery.    PHYSICAL EXAMINATION  ECOG PERFORMANCE STATUS: 1 - Symptomatic but completely ambulatory  Filed Vitals:   09/18/14 0900  BP: 122/82  Pulse: 84  Temp: 98.3 F (36.8 C)  Resp: 16    Physical Exam  Constitutional: She is oriented to person, place, and time and well-developed, well-nourished, and in no distress.  HENT:  Head: Normocephalic and atraumatic.  Ear:TM's are visualized bilaterally and c/w mild fluid Eyes: Pupils are equal, round, and reactive to light.  Neck: Normal range of motion. Neck supple.  Cardiovascular: Normal rate, regular rhythm and normal heart sounds.   Pulmonary/Chest: Effort normal and breath sounds normal.  Abdominal: Soft. Bowel sounds are normal.  Musculoskeletal: Normal range of motion.  Neurological: She is alert and  oriented to person, place, and time.  Skin: Skin is warm and dry.    LABORATORY DATA:  CBC    Component Value Date/Time   WBC 7.9 09/18/2014 1056   RBC 4.94 09/18/2014 1056   RBC 4.56 08/03/2014 1420   HGB 13.5 09/18/2014 1056   HCT 41.3 09/18/2014 1056   HCT 41 07/30/2011 0743   PLT 214 09/18/2014 1056   MCV 83.6 09/18/2014 1056   MCV 104.3 07/30/2011 0743   MCH 27.3 09/18/2014 1056   MCHC 32.7 09/18/2014 1056   RDW NOT CALCULATED 08/03/2014 1420   LYMPHSABS 1.6 09/18/2014 1056   MONOABS 0.6 09/18/2014 1056   EOSABS 0.3 09/18/2014 1056   BASOSABS 0.1 09/18/2014 1056   CMP     Component Value Date/Time   NA 138 09/18/2014 1056   NA 138 07/30/2011 0741   K 3.7 09/18/2014 1056   K 3.8 07/30/2011 0741   CL 102 09/18/2014 1056   CO2 26 09/18/2014 1056   GLUCOSE 88 09/18/2014 1056   BUN 15 09/18/2014 1056   BUN 4 07/30/2011 0741   CREATININE 1.17* 09/18/2014 1056   CREATININE 1.27* 06/01/2014 1516   CALCIUM 9.4 09/18/2014 1056   PROT 8.4* 09/18/2014 1056   ALBUMIN 4.2 09/18/2014 1056   AST 25 09/18/2014 1056   AST 171 07/30/2011 0741   ALT 17 09/18/2014 1056   ALKPHOS 83 09/18/2014 1056   ALKPHOS 111 07/30/2011 0741   BILITOT 0.5 09/18/2014 1056   BILITOT 3.6 07/30/2011 0741   GFRNONAA 51* 09/18/2014 1056   GFRNONAA 55* 12/16/2012 1116   GFRAA 59* 09/18/2014 1056   GFRAA 64 12/16/2012 1116     ASSESSMENT and THERAPY PLAN:   Severe microcytic Anemia with serum ferritin 5 ng/ml Cirrhosis Givens Capsule with AVM in the distal jejunum or proximal ileum History of folate deficiency 2.8 ng/ml EGD 06/29/2014 with polypoid lesions in the duodenum, single duodenal AVM Colonoscopy on 01/17/2013 with rectal varices, polyps and hemorrhoids Iron deficiency anemia secondary to chronic GI blood loss  Her ferritin level is pending today, she was advised him we will call her if she needs additional iron replacement. Her blood counts are markedly improved. She has GI related  blood loss from AVMs. She has an appointment coming up at [facility] for a small bowel enteroscopy.  We will continue with observation of her blood  counts and iron levels on a 6 week basis. I will plan on seeing her back in 3 months. I notified her to call the clinic if she develops any symptoms of her iron deficiency.  Orders Placed This Encounter  Procedures  . CBC with Differential    Standing Status: Standing     Number of Occurrences: 3     Standing Expiration Date: 09/18/2015  . Comprehensive metabolic panel    Standing Status: Standing     Number of Occurrences: 3     Standing Expiration Date: 09/18/2015  . Ferritin    Standing Status: Standing     Number of Occurrences: 3     Standing Expiration Date: 09/18/2015  . Iron and TIBC    Standing Status: Standing     Number of Occurrences: 3     Standing Expiration Date: 09/18/2015    All questions were answered. The patient knows to call the clinic with any problems, questions or concerns. We can certainly see the patient much sooner if necessary. This note was electronically signed.  This document serves as a record of services personally performed by Ancil Linsey, MD. It was created on her behalf by Arlyce Harman, a trained medical scribe. The creation of this record is based on the scribe's personal observations and the provider's statements to them. This document has been checked and approved by the attending provider.  I have reviewed the above documentation for accuracy and completeness, and I agree with the above.  Ancil Linsey, MD

## 2014-09-18 NOTE — Patient Instructions (Addendum)
Deanna Thompson at [facility] Discharge Instructions  RECOMMENDATIONS MADE BY THE CONSULTANT AND ANY TEST RESULTS WILL BE SENT TO YOUR REFERRING PHYSICIAN.  Exam and discussion by Dr. Whitney Muse. Will check labs today  Call with increased fatigue or other concerns.  Labs in 6 weeks and with  office visit in 3 months.  Thank you for choosing [place] at [facility] to provide your oncology and hematology care.  To afford each patient quality time with our provider, please arrive at least 15 minutes before your scheduled appointment time.    You need to re-schedule your appointment should you arrive 10 or more minutes late.  We strive to give you quality time with our providers, and arriving late affects you and other patients whose appointments are after yours.  Also, if you no show three or more times for appointments you may be dismissed from the clinic at the providers discretion.     Again, thank you for choosing [facility].  Our hope is that these requests will decrease the amount of time that you wait before being seen by our physicians.       _____________________________________________________________  Should you have questions after your visit to [facility], please contact our office at [phone] between the hours of 8:30 a.m. and 4:30 p.m.  Voicemails left after 4:30 p.m. will not be returned until the following business day.  For prescription refill requests, have your pharmacy contact our office.

## 2014-09-19 ENCOUNTER — Encounter (HOSPITAL_COMMUNITY): Payer: Self-pay | Admitting: Hematology & Oncology

## 2014-09-19 ENCOUNTER — Other Ambulatory Visit (HOSPITAL_COMMUNITY): Payer: Self-pay | Admitting: Hematology & Oncology

## 2014-09-19 DIAGNOSIS — D539 Nutritional anemia, unspecified: Secondary | ICD-10-CM | POA: Insufficient documentation

## 2014-09-19 DIAGNOSIS — K7460 Unspecified cirrhosis of liver: Secondary | ICD-10-CM | POA: Insufficient documentation

## 2014-09-19 DIAGNOSIS — D500 Iron deficiency anemia secondary to blood loss (chronic): Secondary | ICD-10-CM

## 2014-09-19 MED ORDER — SODIUM CHLORIDE 0.9 % IV SOLN
125.0000 mg | Freq: Every day | INTRAVENOUS | Status: AC
  Filled 2014-09-19: qty 10

## 2014-09-20 ENCOUNTER — Other Ambulatory Visit: Payer: Self-pay | Admitting: Family Medicine

## 2014-09-20 MED ORDER — AMITRIPTYLINE HCL 10 MG PO TABS: 10.0000 mg | ORAL_TABLET | Freq: Every day | ORAL | Status: DC

## 2014-09-20 NOTE — Telephone Encounter (Signed)
Ok 11 ref 

## 2014-09-20 NOTE — Telephone Encounter (Signed)
Patient wants to know if you will start filling her amitriptyline(Elavil ) 10 mg she is no longer seeing her oral surgeron Dr. Owens Shark. Send to [place].

## 2014-09-20 NOTE — Telephone Encounter (Signed)
Rx sent electronically to pharmacy. Patient notified. 

## 2014-09-24 ENCOUNTER — Other Ambulatory Visit: Payer: Self-pay | Admitting: Family Medicine

## 2014-10-01 NOTE — Progress Notes (Signed)
This encounter was created in error - please disregard.

## 2014-10-02 ENCOUNTER — Other Ambulatory Visit (HOSPITAL_COMMUNITY): Payer: Self-pay | Admitting: Hematology & Oncology

## 2014-10-02 ENCOUNTER — Encounter (HOSPITAL_COMMUNITY): Payer: BLUE CROSS/BLUE SHIELD | Attending: Hematology & Oncology

## 2014-10-02 VITALS — BP 113/67 | HR 70 | Temp 98.4°F | Resp 18

## 2014-10-02 DIAGNOSIS — D500 Iron deficiency anemia secondary to blood loss (chronic): Secondary | ICD-10-CM

## 2014-10-02 DIAGNOSIS — K7469 Other cirrhosis of liver: Secondary | ICD-10-CM | POA: Insufficient documentation

## 2014-10-02 MED ORDER — SODIUM CHLORIDE 0.9 % IV SOLN
INTRAVENOUS | Status: DC
  Administered 2014-10-02: 14:00:00 via INTRAVENOUS

## 2014-10-02 MED ORDER — SODIUM CHLORIDE 0.9 % IJ SOLN
10.0000 mL | Freq: Once | INTRAMUSCULAR | Status: AC
  Administered 2014-10-02: 10 mL via INTRAVENOUS

## 2014-10-02 MED ORDER — SODIUM CHLORIDE 0.9 % IV SOLN
125.0000 mg | Freq: Once | INTRAVENOUS | Status: DC
  Filled 2014-10-02: qty 10

## 2014-10-02 MED ORDER — SODIUM CHLORIDE 0.9 % IV SOLN
125.0000 mg | Freq: Once | INTRAVENOUS | Status: AC
  Administered 2014-10-02: 125 mg via INTRAVENOUS
  Filled 2014-10-02: qty 10

## 2014-10-02 NOTE — Patient Instructions (Signed)
[place] at [facility] Discharge Instructions  RECOMMENDATIONS MADE BY THE CONSULTANT AND ANY TEST RESULTS WILL BE SENT TO YOUR REFERRING PHYSICIAN.  Ferric gluconate 125 mg infusion 1 of 2 given as ordered. Return as scheduled for dose 2 of 2.  Thank you for choosing [place] at [facility] to provide your oncology and hematology care.  To afford each patient quality time with our provider, please arrive at least 15 minutes before your scheduled appointment time.    You need to re-schedule your appointment should you arrive 10 or more minutes late.  We strive to give you quality time with our providers, and arriving late affects you and other patients whose appointments are after yours.  Also, if you no show three or more times for appointments you may be dismissed from the clinic at the providers discretion.     Again, thank you for choosing [facility].  Our hope is that these requests will decrease the amount of time that you wait before being seen by our physicians.       _____________________________________________________________  Should you have questions after your visit to [facility], please contact our office at [phone] between the hours of 8:30 a.m. and 4:30 p.m.  Voicemails left after 4:30 p.m. will not be returned until the following business day.  For prescription refill requests, have your pharmacy contact our office.

## 2014-10-02 NOTE — Progress Notes (Signed)
Tolerated iron infusion well. 

## 2014-10-09 ENCOUNTER — Encounter (HOSPITAL_BASED_OUTPATIENT_CLINIC_OR_DEPARTMENT_OTHER): Payer: BLUE CROSS/BLUE SHIELD

## 2014-10-09 VITALS — BP 118/60 | HR 74 | Temp 98.4°F | Resp 18

## 2014-10-09 DIAGNOSIS — D500 Iron deficiency anemia secondary to blood loss (chronic): Secondary | ICD-10-CM

## 2014-10-09 MED ORDER — SODIUM CHLORIDE 0.9 % IV SOLN
125.0000 mg | Freq: Once | INTRAVENOUS | Status: AC
  Administered 2014-10-09: 125 mg via INTRAVENOUS
  Filled 2014-10-09: qty 10

## 2014-10-09 MED ORDER — SODIUM CHLORIDE 0.9 % IV SOLN
INTRAVENOUS | Status: DC
  Administered 2014-10-09: 14:00:00 via INTRAVENOUS

## 2014-10-09 NOTE — Progress Notes (Signed)
Patient tolerated infusion well.  VSS 30 minutes post infusion.   

## 2014-10-11 ENCOUNTER — Ambulatory Visit (INDEPENDENT_AMBULATORY_CARE_PROVIDER_SITE_OTHER): Payer: BLUE CROSS/BLUE SHIELD | Admitting: Gastroenterology

## 2014-10-11 VITALS — BP 110/74 | HR 83 | Temp 97.5°F | Ht 68.0 in | Wt 140.0 lb

## 2014-10-11 DIAGNOSIS — K7030 Alcoholic cirrhosis of liver without ascites: Secondary | ICD-10-CM | POA: Diagnosis not present

## 2014-10-11 DIAGNOSIS — D500 Iron deficiency anemia secondary to blood loss (chronic): Secondary | ICD-10-CM

## 2014-10-11 NOTE — Assessment & Plan Note (Addendum)
WELL COMPENSATED DISEASE.  LABS/U/S DEC 2016-CMP,PT/INR, CBC, AFP, RUQ U/S. OPV DEC 2016

## 2014-10-11 NOTE — Assessment & Plan Note (Signed)
OCCASIONAL BRBPR OTHERWISE NO MELENA. LIKELY DUE TO SMALL BOWEL OR COLONIC AVMs, LESS LIKELY COLON POLYP/CANCER   WORKUP COMPLETE AT [facility] NO DBE INDICATED. REVIEWED RECORDS/CTE WITH PT FROM [facility]. AWAIT LABS AUG 2 IF UNABLE TO MAINTAIN FERRITIN. PT NEEDS REPEAT TCS. FOLLOW UP IN 6 MOS.

## 2014-10-11 NOTE — Progress Notes (Signed)
Subjective:    Patient ID: Deanna Thompson, female    DOB: [birth year], 59 y.o.   MRN: [id number]  [name], MD  HPI EATING FIBER BAR BUT NOT HAVING SATISFACTORY BM. COLACE HELPS WHEN SHE TAKES IT. MIRALAX HELPS BUT DOESN'T WANT TO TAKE DAILY. NOT TRIED AMITIZA OR LINZESS. HAS HEMORRHOIDS AND OCCASIONALLY BLEEDS. NOT EVERY TIME BUT WHEN SHE STRAINING CAN SEE SMALL AMOUNT OF BLOOD(FRESH), NEVER BLACK AND TARRY. HB NORMAL BUT IRON STORES STILL LOW. FINALLY ABLE TO SING. MILD NAUSEA. SERIOUS GI UPSET: GAS AND GAS-X HELPS. BMS: TROUBLE MOVING BOWELS. MAY HAVE MILD R INGUINAL PAIN: 2/10 BUT 3 TIMES IN PAST YEARS. HAD SHARP PAIN AND RESOLVES IN 30-45 SECS. HEARTBURN BETTER WITH GAS-X.   PT DENIES FEVER, CHILLS, vomiting, CHEST PAIN, SHORTNESS OF BREATH, problems swallowing, problems with sedation, OR heartburn or indigestion.  Past Medical History  Diagnosis Date  . IBS (irritable bowel syndrome)   . GERD (gastroesophageal reflux disease)   . Panic attack   . Anxiety   . Psoriatic arthritis   . ETOH abuse     Quit in 07/2011   . Sjogren's disease   . Cirrhosis     Presumably alcoholic cirrhosis  . Complication of anesthesia   . PONV (postoperative nausea and vomiting)   . Abdominal hernia   . 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 Seizures 1997    1 seizure from coil insertion but no more seizures and no meds for seizures  . HTN (hypertension)     no longer medicated, dated on 04/25/12  . Fatigue     due to Sjogren's disease  . Arthritis     back  and down flank  right side more than left  . Ascites 08/27/2011    FEB 2014 10.7 L   . Right inguinal hernia 08/16/2012  . [place] HEAD 02/03/2007    Qualifier: Diagnosis of  By: Aline Brochure MD, Dorothyann Peng    . Alcoholic hepatitis 10/22/3662    Past Surgical History  Procedure Laterality Date  . Esophageal biopsy  10/13/2011    SLF: mild gastritis/duodenal polypoid lesion   . Colonoscopy  10/13/2011   [id number] hemorrhoids/varices rectal/lesion in ascending colon(HYPERPLASTIC POLYP)  . Paracentesis  Feb 2014    10.7 liters  . Colonoscopy N/A 01/17/2013    SLF: 1. Internal hemorrhoids 2. Ascending colon polyps 3.  Rectal varices.   . Cerebral aneurysm repair  1997  . Umbilical hernia repair N/A 06/09/2013    Procedure: UMBILICAL HERNIORRHAPHY WITH MESH;  Surgeon: Jamesetta So, MD;  Location: [facility];  Service: General;  Laterality: N/A;  . Insertion of mesh N/A 06/09/2013    Procedure: INSERTION OF MESH;  Surgeon: Jamesetta So, MD;  Location: [facility];  Service: General;  Laterality: N/A;  . Inguinal hernia repair Right 08/28/2013    Procedure: RIGHT NGUINAL HERNIORRHAPHY WITH MESH;  Surgeon: Jamesetta So, MD;  Location: [facility];  Service: General;  Laterality: Right;  . Insertion of mesh Right 08/28/2013    Procedure: INSERTION OF MESH;  Surgeon: Jamesetta So, MD;  Location: [facility];  Service: General;  Laterality: Right;  . Agile capsule N/A 06/21/2014    Procedure: AGILE CAPSULE;  Surgeon: Danie Binder, MD;  Location[facility];  Service: Endoscopy;  Laterality: N/A;  0700  . Esophagogastroduodenoscopy N/A 06/29/2014    SLF: 1. anemia due to polypoid lesions in teh antrum, and duodenal polyps  2. single duodenum AVM  . Givens capsule study N/A 06/29/2014    Procedure: GIVENS CAPSULE STUDY;  Surgeon: Danie Binder, MD;  [facility];  Service: Endoscopy;  Laterality: N/A;  . Givens capsule study N/A 07/13/2014    Procedure: GIVENS CAPSULE STUDY;  Surgeon: Danie Binder, MD;  [facility];  Service: Endoscopy;  Laterality: N/A;  700   Allergies  Allergen Reactions  . Omeprazole Shortness Of Breath  . Other Shortness Of Breath and Rash    Red Meat Causes shortness of breath, rash and flu like symptoms.   . Phenytoin Rash  . Keflex [Cephalexin] Other (See Comments)    Severe yeast infection  . Phenergan [Promethazine Hcl] Other (See Comments)    Not in right  state of mind, HALLUCINATION   Current Outpatient Prescriptions  Medication Sig Dispense Refill  . acetaminophen (TYLENOL) 500 MG tablet Take 1,000 mg by mouth every 6 (six) hours as needed for moderate pain.    Marland Kitchen amitriptyline (ELAVIL) 10 MG tablet Take 1 tablet (10 mg total) by mouth at bedtime.    . bumetanide (BUMEX) 1 MG tablet Take 1 tablet (1 mg total) by mouth 2 (two) times daily.    Marland Kitchen buPROPion (WELLBUTRIN XL) 150 MG 24 hr tablet Take 1 tablet (150 mg total) by mouth at bedtime.    . carboxymethylcellulose (REFRESH PLUS) 0.5 % SOLN Place 2 drops into both eyes 3 (three) times daily as needed (Dry Eyes).     Marland Kitchen docusate sodium (COLACE) 100 MG capsule Take 100 mg by mouth daily.    Marland Kitchen escitalopram (LEXAPRO) 20 MG tablet TAKE (1) TABLET BY MOUTH AT BEDTIME.    Marland Kitchen loratadine (CLARITIN) 10 MG tablet Take 10 mg by mouth daily as needed for allergies.     . Multiple Vitamin (MULTIVITAMIN WITH MINERALS) TABS tablet Take 1 tablet by mouth daily.    Marland Kitchen NEXIUM 40 MG capsule TAKE 1 CAPSULE DAILY BEFORE BREAKFAST    . sodium chloride (OCEAN) 0.65 % nasal spray Place 1 spray into the nose as needed for congestion.    Marland Kitchen spironolactone (ALDACTONE) 100 MG tablet TAKE (1) TABLET BY MOUTH TWICE DAILY.    Marland Kitchen XIFAXAN 550 MG TABS tablet TAKE (1) TABLET BY MOUTH TWICE DAILY.     Review of Systems May have neck, and jaw pain. USING TYLENOL PRN BUT NOT GETTING RELIEF.    Objective:   Physical Exam  Constitutional: She is oriented to person, place, and time. She appears well-developed and well-nourished. No distress.  HENT:  Head: Normocephalic and atraumatic.  Mouth/Throat: Oropharynx is clear and moist. No oropharyngeal exudate.  Eyes: Pupils are equal, round, and reactive to light. No scleral icterus.  Neck: Normal range of motion. Neck supple.  Cardiovascular: Normal rate, regular rhythm and normal heart sounds.   Pulmonary/Chest: Effort normal and breath sounds normal. No respiratory distress.    Abdominal: Soft. Bowel sounds are normal. She exhibits no distension. There is no tenderness.  Musculoskeletal: She exhibits no edema.  Lymphadenopathy:    She has no cervical adenopathy.  Neurological: She is alert and oriented to person, place, and time.  Psychiatric: She has a normal mood and affect.  Vitals reviewed.     Assessment & Plan:

## 2014-10-11 NOTE — Patient Instructions (Signed)
CALL ME WHEN YOUR LABS ARE DRAWN AUG 2.  COMPLETE LABS IN DEC 2016 1 WEEK PRIOR TO YOUR NEXT VISIT.  FOLLOW UP IN DEC 2016.

## 2014-10-12 NOTE — Progress Notes (Signed)
cc'ed to pcp °

## 2014-10-20 ENCOUNTER — Other Ambulatory Visit: Payer: Self-pay | Admitting: Gastroenterology

## 2014-10-26 ENCOUNTER — Other Ambulatory Visit: Payer: Self-pay | Admitting: Gastroenterology

## 2014-10-27 ENCOUNTER — Other Ambulatory Visit: Payer: Self-pay | Admitting: Family Medicine

## 2014-10-28 NOTE — Progress Notes (Signed)
REVIEWED. HERNIA REPAIR JUN 2015.

## 2014-10-29 ENCOUNTER — Telehealth: Payer: Self-pay | Admitting: Family Medicine

## 2014-10-29 MED ORDER — ESCITALOPRAM OXALATE 20 MG PO TABS: ORAL_TABLET | ORAL | Status: DC

## 2014-10-29 NOTE — Telephone Encounter (Signed)
Patient needs Rx escitalopram (LEXAPRO) 20 MG tablet to [place].

## 2014-10-29 NOTE — Telephone Encounter (Signed)
Rx sent electronically to pharmacy. Patient notified. 

## 2014-10-30 ENCOUNTER — Encounter (HOSPITAL_COMMUNITY): Payer: BLUE CROSS/BLUE SHIELD | Attending: Hematology & Oncology

## 2014-10-30 DIAGNOSIS — D500 Iron deficiency anemia secondary to blood loss (chronic): Secondary | ICD-10-CM | POA: Insufficient documentation

## 2014-10-30 DIAGNOSIS — K7469 Other cirrhosis of liver: Secondary | ICD-10-CM | POA: Diagnosis not present

## 2014-10-30 LAB — CBC WITH DIFFERENTIAL/PLATELET
Basophils Absolute: 0.1 10*3/uL (ref 0.0–0.1)
Basophils Relative: 1 % (ref 0–1)
EOS PCT: 5 % (ref 0–5)
Eosinophils Absolute: 0.4 10*3/uL (ref 0.0–0.7)
HEMATOCRIT: 40.8 % (ref 36.0–46.0)
Hemoglobin: 13.4 g/dL (ref 12.0–15.0)
LYMPHS ABS: 1.8 10*3/uL (ref 0.7–4.0)
LYMPHS PCT: 22 % (ref 12–46)
MCH: 29.3 pg (ref 26.0–34.0)
MCHC: 32.8 g/dL (ref 30.0–36.0)
MCV: 89.3 fL (ref 78.0–100.0)
MONO ABS: 0.8 10*3/uL (ref 0.1–1.0)
Monocytes Relative: 10 % (ref 3–12)
Neutro Abs: 5.1 10*3/uL (ref 1.7–7.7)
Neutrophils Relative %: 62 % (ref 43–77)
PLATELETS: 260 10*3/uL (ref 150–400)
RBC: 4.57 MIL/uL (ref 3.87–5.11)
RDW: 16.2 % — ABNORMAL HIGH (ref 11.5–15.5)
WBC: 8.1 10*3/uL (ref 4.0–10.5)

## 2014-10-30 LAB — FERRITIN: FERRITIN: 37 ng/mL (ref 11–307)

## 2014-10-30 LAB — COMPREHENSIVE METABOLIC PANEL
ALK PHOS: 86 U/L (ref 38–126)
ALT: 19 U/L (ref 14–54)
ANION GAP: 9 (ref 5–15)
AST: 23 U/L (ref 15–41)
Albumin: 4.1 g/dL (ref 3.5–5.0)
BILIRUBIN TOTAL: 0.5 mg/dL (ref 0.3–1.2)
BUN: 12 mg/dL (ref 6–20)
CHLORIDE: 104 mmol/L (ref 101–111)
CO2: 27 mmol/L (ref 22–32)
Calcium: 9.8 mg/dL (ref 8.9–10.3)
Creatinine, Ser: 1.14 mg/dL — ABNORMAL HIGH (ref 0.44–1.00)
GFR calc Af Amer: >60 mL/min (ref >60)
GFR, EST NON AFRICAN AMERICAN: 52 mL/min — AB (ref >60)
Glucose, Bld: 62 mg/dL — ABNORMAL LOW (ref 65–99)
Potassium: 4 mmol/L (ref 3.5–5.1)
Sodium: 140 mmol/L (ref 135–145)
Total Protein: 7.9 g/dL (ref 6.5–8.1)

## 2014-10-30 LAB — IRON AND TIBC
IRON: 54 ug/dL (ref 28–170)
Saturation Ratios: 12 % (ref 10.4–31.8)
TIBC: 435 ug/dL (ref 250–450)
UIBC: 381 ug/dL

## 2014-10-30 NOTE — Progress Notes (Signed)
LABS DRAWN

## 2014-10-31 ENCOUNTER — Other Ambulatory Visit (HOSPITAL_COMMUNITY): Payer: Self-pay | Admitting: Hematology & Oncology

## 2014-11-02 ENCOUNTER — Encounter (HOSPITAL_BASED_OUTPATIENT_CLINIC_OR_DEPARTMENT_OTHER): Payer: BLUE CROSS/BLUE SHIELD

## 2014-11-02 ENCOUNTER — Encounter (HOSPITAL_COMMUNITY): Payer: Self-pay

## 2014-11-02 DIAGNOSIS — D500 Iron deficiency anemia secondary to blood loss (chronic): Secondary | ICD-10-CM

## 2014-11-02 MED ORDER — SODIUM CHLORIDE 0.9 % IV SOLN
INTRAVENOUS | Status: DC
  Administered 2014-11-02: 11:00:00 via INTRAVENOUS

## 2014-11-02 MED ORDER — SODIUM CHLORIDE 0.9 % IV SOLN
510.0000 mg | Freq: Once | INTRAVENOUS | Status: AC
  Administered 2014-11-02: 510 mg via INTRAVENOUS
  Filled 2014-11-02: qty 17

## 2014-11-02 NOTE — Progress Notes (Signed)
Deanna Thompson\ Tolerated iron infusion well today Discharged ambulatory

## 2014-11-02 NOTE — Patient Instructions (Signed)
[facility] at [facility] Discharge Instructions  RECOMMENDATIONS MADE BY THE CONSULTANT AND ANY TEST RESULTS WILL BE SENT TO YOUR REFERRING PHYSICIAN.  IV iron infusion today Return as scheduled Please call the clinic if you have any questions or concerns  Thank you for choosing [facility] at [facility] to provide your oncology and hematology care.  To afford each patient quality time with our provider, please arrive at least 15 minutes before your scheduled appointment time.    You need to re-schedule your appointment should you arrive 10 or more minutes late.  We strive to give you quality time with our providers, and arriving late affects you and other patients whose appointments are after yours.  Also, if you no show three or more times for appointments you may be dismissed from the clinic at the providers discretion.     Again, thank you for choosing [facility].  Our hope is that these requests will decrease the amount of time that you wait before being seen by our physicians.       _____________________________________________________________  Should you have questions after your visit to [facility], please contact our office at [phone] between the hours of 8:30 a.m. and 4:30 p.m.  Voicemails left after 4:30 p.m. will not be returned until the following business day.  For prescription refill requests, have your pharmacy contact our office.     

## 2014-11-08 ENCOUNTER — Telehealth: Payer: Self-pay

## 2014-11-08 NOTE — Telephone Encounter (Signed)
Pt is aware.  

## 2014-11-08 NOTE — Telephone Encounter (Signed)
PLEASE CALL PT. IBUPROFEN WITH FOOD O MILK AND TAKEN SHORT TERM SHOULD'T BE A PROBLEM.

## 2014-11-08 NOTE — Telephone Encounter (Signed)
Pt left Vm that she had an absessed tooth and it was recommended that she use Ibuprofen 200 mg, 4 per day for 2-3 days to clear the inflamation. She wanted to let Dr. Oneida Alar know and make sure she doesn't feel it will be a problem.

## 2014-11-17 ENCOUNTER — Other Ambulatory Visit: Payer: Self-pay | Admitting: Family Medicine

## 2014-11-19 NOTE — Telephone Encounter (Signed)
Ok plus two ref, encour and sched with pt a f u on chronic issues this fall

## 2014-11-27 ENCOUNTER — Other Ambulatory Visit: Payer: Self-pay | Admitting: Family Medicine

## 2014-11-30 ENCOUNTER — Encounter: Payer: Self-pay | Admitting: Family Medicine

## 2014-11-30 ENCOUNTER — Ambulatory Visit (INDEPENDENT_AMBULATORY_CARE_PROVIDER_SITE_OTHER): Payer: BLUE CROSS/BLUE SHIELD | Admitting: Family Medicine

## 2014-11-30 ENCOUNTER — Telehealth: Payer: Self-pay

## 2014-11-30 VITALS — BP 122/70 | Ht 68.0 in | Wt 243.0 lb

## 2014-11-30 DIAGNOSIS — M25512 Pain in left shoulder: Secondary | ICD-10-CM | POA: Diagnosis not present

## 2014-11-30 MED ORDER — TIZANIDINE HCL 4 MG PO TABS: 4.0000 mg | ORAL_TABLET | Freq: Four times a day (QID) | ORAL | Status: DC | PRN

## 2014-11-30 NOTE — Telephone Encounter (Signed)
Pt called and said she is having muscle spasms and will see Dr. Wolfgang Phoenix today at 2:30 pm. She would like to know if it is OK if he offers her a steroid injection, or short term NSAIDS.  She is aware that Dr. Oneida Alar is at the hospital.  OK to left Vm on her cell @ [phone]

## 2014-11-30 NOTE — Telephone Encounter (Signed)
Called patient TO DISCUSS CONCERNS. LVM-MAY USE NSAIDS. USE CAUTION DUE TO KIDNEY DISEASE. STEROIDS OK AND WON;T HARM KIDNEYS OR LIVER. CALL WITH QUESTIONS OR CONCERNS.

## 2014-11-30 NOTE — Progress Notes (Signed)
   Subjective:    Patient ID: Deanna Thompson, female    DOB: 12/09/1955, 59 y.o.   MRN: [id number]  Shoulder Pain  Pain location: back, left shoulder blade, and left arm. This is a new problem. Episode onset: 10 days ago. She has tried acetaminophen for the symptoms. The treatment provided mild relief. PMH includes fibromyalgia   10 d ago or so,  Left post shoulder spasms and pain  Takes tylenol prn    Off and on, aching  No known injury   No sig strain   All day and all eve pain , low gr always plus spasm feeling   Dee[ ache intermittent;y      Review of Systems No headache no neck pain no pain radiating down arm.    Objective:   Physical Exam  Alert vitals stable. Lungs clear heart rare rhythm. Positive parascapular tenderness left side. Shoulder good range of motion. No impingement sign      Assessment & Plan:  Impression 1 shoulder strain with spasm. Muscle in nature. Discussed. Plan no x-rays rationale discussed. Add muscle spasm H&T noninflammatory local measures discussed WSL

## 2014-12-05 ENCOUNTER — Encounter: Payer: Self-pay | Admitting: Gastroenterology

## 2014-12-05 ENCOUNTER — Telehealth: Payer: Self-pay | Admitting: Gastroenterology

## 2014-12-05 NOTE — Telephone Encounter (Signed)
RECALL FOR ULTRASOUND AND LABS IN OCT

## 2014-12-06 ENCOUNTER — Telehealth: Payer: Self-pay

## 2014-12-06 NOTE — Telephone Encounter (Signed)
Opened in error

## 2014-12-06 NOTE — Telephone Encounter (Signed)
Mailed letter °

## 2014-12-12 DIAGNOSIS — K0401 Reversible pulpitis: Secondary | ICD-10-CM

## 2014-12-12 HISTORY — DX: Reversible pulpitis: K04.01

## 2014-12-12 HISTORY — PX: TOOTH EXTRACTION: SUR596

## 2014-12-13 NOTE — Telephone Encounter (Signed)
Pt aware of appt for [place] 12/31/2014 9:15am. Pt is aware to be fasting

## 2014-12-19 ENCOUNTER — Encounter (HOSPITAL_BASED_OUTPATIENT_CLINIC_OR_DEPARTMENT_OTHER): Payer: BLUE CROSS/BLUE SHIELD

## 2014-12-19 ENCOUNTER — Encounter (HOSPITAL_COMMUNITY): Payer: BLUE CROSS/BLUE SHIELD | Attending: Hematology & Oncology | Admitting: Hematology & Oncology

## 2014-12-19 ENCOUNTER — Encounter (HOSPITAL_COMMUNITY): Payer: Self-pay | Admitting: Hematology & Oncology

## 2014-12-19 VITALS — BP 121/99 | HR 92 | Temp 99.0°F | Resp 18 | Wt 242.4 lb

## 2014-12-19 DIAGNOSIS — D500 Iron deficiency anemia secondary to blood loss (chronic): Secondary | ICD-10-CM | POA: Diagnosis not present

## 2014-12-19 DIAGNOSIS — K7469 Other cirrhosis of liver: Secondary | ICD-10-CM | POA: Diagnosis not present

## 2014-12-19 LAB — COMPREHENSIVE METABOLIC PANEL
ALT: 16 U/L (ref 14–54)
AST: 22 U/L (ref 15–41)
Albumin: 4.4 g/dL (ref 3.5–5.0)
Alkaline Phosphatase: 90 U/L (ref 38–126)
Anion gap: 8 (ref 5–15)
BUN: 20 mg/dL (ref 6–20)
CHLORIDE: 102 mmol/L (ref 101–111)
CO2: 28 mmol/L (ref 22–32)
Calcium: 9.3 mg/dL (ref 8.9–10.3)
Creatinine, Ser: 1.23 mg/dL — ABNORMAL HIGH (ref 0.44–1.00)
GFR, EST AFRICAN AMERICAN: 55 mL/min — AB (ref >60)
GFR, EST NON AFRICAN AMERICAN: 48 mL/min — AB (ref >60)
Glucose, Bld: 126 mg/dL — ABNORMAL HIGH (ref 65–99)
POTASSIUM: 3.6 mmol/L (ref 3.5–5.1)
Sodium: 138 mmol/L (ref 135–145)
Total Bilirubin: 0.7 mg/dL (ref 0.3–1.2)
Total Protein: 8.5 g/dL — ABNORMAL HIGH (ref 6.5–8.1)

## 2014-12-19 LAB — CBC WITH DIFFERENTIAL/PLATELET
Basophils Absolute: 0.1 10*3/uL (ref 0.0–0.1)
Basophils Relative: 1 %
Eosinophils Absolute: 0.3 10*3/uL (ref 0.0–0.7)
Eosinophils Relative: 3 %
HCT: 42.7 % (ref 36.0–46.0)
Hemoglobin: 14.5 g/dL (ref 12.0–15.0)
Lymphocytes Relative: 19 %
Lymphs Abs: 1.7 10*3/uL (ref 0.7–4.0)
MCH: 31.3 pg (ref 26.0–34.0)
MCHC: 34 g/dL (ref 30.0–36.0)
MCV: 92.2 fL (ref 78.0–100.0)
Monocytes Absolute: 0.6 10*3/uL (ref 0.1–1.0)
Monocytes Relative: 6 %
Neutro Abs: 6.1 10*3/uL (ref 1.7–7.7)
Neutrophils Relative %: 71 %
Platelets: 261 10*3/uL (ref 150–400)
RBC: 4.63 MIL/uL (ref 3.87–5.11)
RDW: 16.1 % — ABNORMAL HIGH (ref 11.5–15.5)
WBC: 8.7 10*3/uL (ref 4.0–10.5)

## 2014-12-19 LAB — IRON AND TIBC
Iron: 83 ug/dL (ref 28–170)
Saturation Ratios: 18 % (ref 10.4–31.8)
TIBC: 452 ug/dL — ABNORMAL HIGH (ref 250–450)
UIBC: 369 ug/dL

## 2014-12-19 LAB — FERRITIN: FERRITIN: 62 ng/mL (ref 11–307)

## 2014-12-19 NOTE — Progress Notes (Signed)
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 female returns for iron deficiency and anemia.  Deanna Thompson is here alone today.   She recently had an oral abscess, and had a tooth pulled last week. She says she's been in pain recently as a result of that.  She states that she doesn't currently feel the fatigue she once felt, but that if she could do anything, she'd go home and take a nap. 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  She says her left shoulder is what's killing her now. She says it seems like as soon as one pain stops, another one starts back up. She particularly comments on a pain in her right upper back, under the shoulder blade. She says it "spasms" often.  She had a pillcam recently, and is returning to see the gastroenterologist again in October.   She says taking nothing but Colace makes her use the bathroom once a day; "it's not black, not tarry, but does look darker." She remarks that when she uses Miralax, her stool appears lighter. She says in general, her stool isn't any darker than it's ever been.  She has not had her mammogram but notes that it is scheduled.  MEDICAL HISTORY: Past Medical History  Diagnosis Date  . IBS (irritable bowel syndrome)   . GERD (gastroesophageal reflux disease)   . Panic attack   . Anxiety   . Psoriatic arthritis   . ETOH abuse     Quit in 07/2011   . Sjogren's disease   . Cirrhosis     Presumably alcoholic cirrhosis  . Complication of anesthesia   . PONV  (postoperative nausea and vomiting)   . Abdominal hernia   . 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 Seizures 1997    1 seizure from coil insertion but no more seizures and no meds for seizures  . HTN (hypertension)     no longer medicated, dated on 04/25/12  . Fatigue     due to Sjogren's disease  . Arthritis     back  and down flank  right side more than left  . Ascites 08/27/2011    FEB 2014 10.7 L   . Right inguinal hernia 08/16/2012  . [place] HEAD 02/03/2007    Qualifier: Diagnosis of  By: Aline Brochure MD, Dorothyann Peng    . Alcoholic hepatitis 2/77/8242  . Abscess of pulp of tooth 12/12/14    left side  . Muscle spasm of left shoulder area     has Cirrhosis; Colon polyp, hyperplastic; Rash and nonspecific skin eruption; Labial swelling; Depression; Insomnia; and Iron deficiency anemia due to chronic blood loss on her problem list.     is allergic to omeprazole; other; phenytoin; keflex; and phenergan.  [name] does not currently have medications on file.   SURGICAL HISTORY: Past Surgical History  Procedure Laterality Date  . Esophageal biopsy  10/13/2011    SLF: mild gastritis/duodenal polypoid lesion   . Colonoscopy  10/13/2011    PNT:[id number] hemorrhoids/varices rectal/lesion in  ascending colon(HYPERPLASTIC POLYP)  . Paracentesis  Feb 2014    10.7 liters  . Colonoscopy N/A 01/17/2013    SLF: 1. Internal hemorrhoids 2. Ascending colon polyps 3.  Rectal varices.   . Cerebral aneurysm repair  1997  . Umbilical hernia repair N/A 06/09/2013    Procedure: UMBILICAL HERNIORRHAPHY WITH MESH;  Surgeon: Jamesetta So, MD;  Location: [facility];  Service: General;  Laterality: N/A;  . Insertion of mesh N/A 06/09/2013    Procedure: INSERTION OF MESH;  Surgeon: Jamesetta So, MD;  Location: [facility];  Service: General;  Laterality: N/A;  . Inguinal hernia repair Right 08/28/2013    Procedure: RIGHT NGUINAL HERNIORRHAPHY WITH MESH;  Surgeon: Jamesetta So, MD;   Location: [facility];  Service: General;  Laterality: Right;  . Insertion of mesh Right 08/28/2013    Procedure: INSERTION OF MESH;  Surgeon: Jamesetta So, MD;  Location: [facility];  Service: General;  Laterality: Right;  . Agile capsule N/A 06/21/2014    Procedure: AGILE CAPSULE;  Surgeon: Danie Binder, MD;  Location: [facility];  Service: Endoscopy;  Laterality: N/A;  0700  . Esophagogastroduodenoscopy N/A 06/29/2014    SLF: 1. anemia due to polypoid lesions in teh antrum, and duodenal polyps 2. single duodenum AVM  . Givens capsule study N/A 06/29/2014    Procedure: GIVENS CAPSULE STUDY;  Surgeon: Danie Binder, MD;  [facility]: [facility];  Service: Endoscopy;  Laterality: N/A;  . Givens capsule study N/A 07/13/2014    Procedure: GIVENS CAPSULE STUDY;  Surgeon: Danie Binder, MD;  [facility];  Service: Endoscopy;  Laterality: N/A;  700  . Tooth extraction Left 12/12/14    SOCIAL HISTORY: Social History   Social History  . Marital Status: Married    Spouse Name: N/A  . Number of Children: 0  . Years of Education: N/A   Occupational History  .     Social History Main Topics  . Smoking status: Former Smoker -- 2.00 packs/day for 12 years    Types: Cigarettes    Quit date: 03/31/1995  . Smokeless tobacco: Never Used     Comment: quit in 1997  . Alcohol Use: No     Comment: Bottle of wine a day for years, but quit in 07/2011  . Drug Use: No  . Sexual Activity: Yes    Birth Control/ Protection: Post-menopausal   Other Topics Concern  . Not on file   Social History Narrative   Quit drinking alcohol 3 weeks ago. No drugs, no IVDU.    Married 35 yrs. No children, retired from [employer]. Quit smoking in 1998 Had cerebral aneurysm Hasn't drank alcohol since 5/13, she stated she "let the chardonnay train go down the road" due to cirrhosis; Was previously drinking 3 bottles a day.    FAMILY HISTORY: Family History  Problem Relation Age of Onset  . Cirrhosis  Mother     nash  . Colon cancer Neg Hx   . Inflammatory bowel disease Neg Hx   . Asthma    . Heart disease    . COPD Father   . Heart disease Father    Mother deceased at 35 yo, fatty liver/cirrhosis Father deceased at 3 yo, MI 1 brother 1 sister 1 sister deceased, had surgery had been on xanax, coded - brain dead  Review of Systems  Constitutional: Negative for fever, chills, weight loss and malaise/fatigue.  HENT: Negative for congestion, hearing loss,  nosebleeds, sore throat and tinnitus.   Eyes: Negative for blurred vision, double vision, pain and discharge.  Respiratory: Negative for cough, hemoptysis, sputum production, shortness of breath and wheezing.   Cardiovascular: Negative for chest pain, palpitations, claudication, leg swelling and PND.  Gastrointestinal: Positive for constipation. Negative for heartburn, nausea, vomiting, abdominal pain, diarrhea, blood in stool and melena.  Genitourinary: Negative for dysuria, urgency, frequency and hematuria.  Musculoskeletal: Negative for myalgias, joint pain and falls.  Skin: Negative for itching and rash.  Neurological: Positive for dizziness and headaches. Negative for tingling, tremors, sensory change, speech change, focal weakness, seizures, loss of consciousness, and weakness.       She attributes her dizziness to allergies. Headaches from Fibromyalgia. Endo/Heme/Allergies: Does not bruise/bleed easily.  Psychiatric/Behavioral: Positive for memory loss. Negative for depression, suicidal ideas and substance abuse. The patient is not nervous/anxious and does not have insomnia.   14 point review of systems was performed and is negative except as detailed under history of present illness and above   PHYSICAL EXAMINATION  ECOG PERFORMANCE STATUS: 1 - Symptomatic but completely ambulatory  Filed Vitals:   12/19/14 1300  BP: 121/99  Pulse: 92  Temp: 99 F (37.2 C)  Resp: 18    Physical Exam  Constitutional: She is  oriented to person, place, and time and well-developed, well-nourished, and in no distress.  HENT:  Head: Normocephalic and atraumatic.  Ear:TM's are visualized bilaterally and c/w mild fluid Eyes: Pupils are equal, round, and reactive to light.  Neck: Normal range of motion. Neck supple.  Cardiovascular: Normal rate, regular rhythm and normal heart sounds.   Pulmonary/Chest: Effort normal and breath sounds normal.  Abdominal: Soft. Bowel sounds are normal.  Musculoskeletal: Normal range of motion.  Neurological: She is alert and oriented to person, place, and time.  Skin: Skin is warm and dry.    LABORATORY DATA: I have reviewed the data as listed.  CBC    Component Value Date/Time   WBC 8.7 12/19/2014 1248   RBC 4.63 12/19/2014 1248   RBC 4.56 08/03/2014 1420   HGB 14.5 12/19/2014 1248   HCT 42.7 12/19/2014 1248   HCT 41 07/30/2011 0743   PLT 261 12/19/2014 1248   MCV 92.2 12/19/2014 1248   MCV 104.3 07/30/2011 0743   MCH 31.3 12/19/2014 1248   MCHC 34.0 12/19/2014 1248   RDW 16.1* 12/19/2014 1248   LYMPHSABS 1.7 12/19/2014 1248   MONOABS 0.6 12/19/2014 1248   EOSABS 0.3 12/19/2014 1248   BASOSABS 0.1 12/19/2014 1248   CMP     Component Value Date/Time   NA 138 12/19/2014 1248   NA 138 07/30/2011 0741   K 3.6 12/19/2014 1248   K 3.8 07/30/2011 0741   CL 102 12/19/2014 1248   CO2 28 12/19/2014 1248   GLUCOSE 126* 12/19/2014 1248   BUN 20 12/19/2014 1248   BUN 4 07/30/2011 0741   CREATININE 1.23* 12/19/2014 1248   CREATININE 1.27* 06/01/2014 1516   CALCIUM 9.3 12/19/2014 1248   PROT 8.5* 12/19/2014 1248   ALBUMIN 4.4 12/19/2014 1248   AST 22 12/19/2014 1248   AST 171 07/30/2011 0741   ALT 16 12/19/2014 1248   ALKPHOS 90 12/19/2014 1248   ALKPHOS 111 07/30/2011 0741   BILITOT 0.7 12/19/2014 1248   BILITOT 3.6 07/30/2011 0741   GFRNONAA 48* 12/19/2014 1248   GFRNONAA 55* 12/16/2012 1116   GFRAA 55* 12/19/2014 1248   GFRAA 64 12/16/2012 1116     ASSESSMENT and  THERAPY PLAN:   Severe microcytic Anemia with serum ferritin 5 ng/ml Cirrhosis Givens Capsule with AVM in the distal jejunum or proximal ileum History of folate deficiency 2.8 ng/ml EGD 06/29/2014 with polypoid lesions in the duodenum, single duodenal AVM Colonoscopy on 01/17/2013 with rectal varices, polyps and hemorrhoids Iron deficiency anemia secondary to chronic GI blood loss  Her ferritin level is pending today, she was advised him we will call her if she needs additional iron replacement. Her blood counts are markedly improved. She has GI related blood loss from AVMs.   We will continue with observation of her blood counts and iron levels on a 6 week basis. I will plan on seeing her back in 3 months. I notified her to call the clinic if she develops any symptoms of her iron deficiency. Otherwise she is doing well.    Orders Placed This Encounter  Procedures  . CBC with Differential    Standing Status: Standing     Number of Occurrences: 24     Standing Expiration Date: 12/18/2016  . Comprehensive metabolic panel    Standing Status: Standing     Number of Occurrences: 24     Standing Expiration Date: 12/18/2016  . Iron and TIBC    Standing Status: Standing     Number of Occurrences: 24     Standing Expiration Date: 12/18/2016  . Ferritin    Standing Status: Standing     Number of Occurrences: 24     Standing Expiration Date: 12/18/2016   All questions were answered. The patient knows to call the clinic with any problems, questions or concerns. We can certainly see the patient much sooner if necessary.  This document serves as a record of services personally performed by Ancil Linsey, MD. It was created on her behalf by Toni Amend, a trained medical scribe. The creation of this record is based on the scribe's personal observations and the provider's statements to them. This document has been checked and approved by the attending provider.  I have  reviewed the above documentation for accuracy and completeness, and I agree with the above.  This note was electronically signed.  Ancil Linsey, MD

## 2014-12-19 NOTE — Patient Instructions (Signed)
[place] at [facility] Discharge Instructions  RECOMMENDATIONS MADE BY THE CONSULTANT AND ANY TEST RESULTS WILL BE SENT TO YOUR REFERRING PHYSICIAN.  Exam and discussion by Dr. Whitney Muse. Your labs are stable. No changes at present. Call with any concerns.  Labs every 6 weeks and office visit in 4 months.  Thank you for choosing [place] at [facility] to provide your oncology and hematology care.  To afford each patient quality time with our provider, please arrive at least 15 minutes before your scheduled appointment time.    You need to re-schedule your appointment should you arrive 10 or more minutes late.  We strive to give you quality time with our providers, and arriving late affects you and other patients whose appointments are after yours.  Also, if you no show three or more times for appointments you may be dismissed from the clinic at the providers discretion.     Again, thank you for choosing [facility].  Our hope is that these requests will decrease the amount of time that you wait before being seen by our physicians.       _____________________________________________________________  Should you have questions after your visit to [facility], please contact our office at [phone] between the hours of 8:30 a.m. and 4:30 p.m.  Voicemails left after 4:30 p.m. will not be returned until the following business day.  For prescription refill requests, have your pharmacy contact our office.

## 2014-12-20 ENCOUNTER — Other Ambulatory Visit: Payer: Self-pay | Admitting: Obstetrics & Gynecology

## 2014-12-20 ENCOUNTER — Other Ambulatory Visit (HOSPITAL_COMMUNITY): Payer: Self-pay | Admitting: Hematology & Oncology

## 2014-12-20 DIAGNOSIS — Z1231 Encounter for screening mammogram for malignant neoplasm of breast: Secondary | ICD-10-CM

## 2014-12-20 NOTE — Progress Notes (Signed)
Labs drawn

## 2014-12-26 ENCOUNTER — Encounter (HOSPITAL_COMMUNITY): Payer: BLUE CROSS/BLUE SHIELD | Attending: Hematology & Oncology

## 2014-12-26 DIAGNOSIS — D500 Iron deficiency anemia secondary to blood loss (chronic): Secondary | ICD-10-CM | POA: Diagnosis not present

## 2014-12-26 MED ORDER — SODIUM CHLORIDE 0.9 % IV SOLN
INTRAVENOUS | Status: DC
  Administered 2014-12-26: 14:00:00 via INTRAVENOUS

## 2014-12-26 MED ORDER — SODIUM CHLORIDE 0.9 % IV SOLN
125.0000 mg | Freq: Once | INTRAVENOUS | Status: AC
  Administered 2014-12-26: 125 mg via INTRAVENOUS
  Filled 2014-12-26: qty 10

## 2014-12-26 NOTE — Progress Notes (Signed)
Patient tolerated infusion well.  VSS post infusion.   

## 2014-12-26 NOTE — Patient Instructions (Signed)
[place] at [facility] Discharge Instructions  RECOMMENDATIONS MADE BY THE CONSULTANT AND ANY TEST RESULTS WILL BE SENT TO YOUR REFERRING PHYSICIAN.  You received IV Iron.   Sodium Ferric Gluconate Complex injection What is this medicine? SODIUM FERRIC GLUCONATE COMPLEX (SOE dee um FER ik GLOO koe nate KOM pleks) is an iron replacement.  COMMON BRAND NAME(S): Ferrlecit, Nulecit  What should I tell my health care provider before I take this medicine? They need to know if you have any of the following conditions: -anemia that is not from iron deficiency -high levels of iron in the body -an unusual or allergic reaction to iron, benzyl alcohol, other medicines, foods, dyes, or preservatives -pregnant or are trying to become pregnant -breast-feeding  How should I use this medicine? This medicine is for infusion into a vein. It is given by a health care professional in a hospital or clinic setting. Talk to your pediatrician regarding the use of this medicine in children. While this drug may be prescribed for children as young as 90 years old for selected conditions, precautions do apply. Overdosage: If you think you have taken too much of this medicine contact a poison control center or emergency room at once. NOTE: This medicine is only for you. Do not share this medicine with others.  What if I miss a dose? It is important not to miss your dose. Call your doctor or health care professional if you are unable to keep an appointment.  What may interact with this medicine? Do not take this medicine with any of the following medications: -deferoxamine -dimercaprol -other iron products This medicine may also interact with the following medications: -chloramphenicol -deferasirox -medicine for blood pressure like enalapril This list may not describe all possible interactions. Give your health care provider a list of all the medicines, herbs, non-prescription drugs,  or dietary supplements you use. Also tell them if you smoke, drink alcohol, or use illegal drugs. Some items may interact with your medicine.  What should I watch for while using this medicine? Your condition will be monitored carefully while you are receiving this medicine. Visit your doctor for check-ups as directed.  What side effects may I notice from receiving this medicine? Side effects that you should report to your doctor or health care professional as soon as possible: -allergic reactions like skin rash, itching or hives, swelling of the face, lips, or tongue -breathing problems -changes in hearing -changes in vision -chills, flushing, or sweating -fast, irregular heartbeat -feeling faint or lightheaded, falls -fever, flu-like symptoms -high or low blood pressure -pain, tingling, numbness in the hands or feet -severe pain in the chest, back, flanks, or groin -swelling of the ankles, feet, hands -trouble passing urine or change in the amount of urine -unusually weak or tired Side effects that usually do not require medical attention (report to your doctor or health care professional if they continue or are bothersome): -cramps -dark colored stools -diarrhea -headache -nausea, vomiting -stomach upset This list may not describe all possible side effects. Call your doctor for medical advice about side effects. You may report side effects to FDA at 1-800-FDA-1088.     Thank you for choosing [place] at [facility] to provide your oncology and hematology care.  To afford each patient quality time with our provider, please arrive at least 15 minutes before your scheduled appointment time.    You need to re-schedule your appointment should you arrive 10 or more  minutes late.  We strive to give you quality time with our providers, and arriving late affects you and other patients whose appointments are after yours.  Also, if you no show three or more times  for appointments you may be dismissed from the clinic at the providers discretion.     Again, thank you for choosing [facility].  Our hope is that these requests will decrease the amount of time that you wait before being seen by our physicians.       _____________________________________________________________  Should you have questions after your visit to [facility], please contact our office at [phone] between the hours of 8:30 a.m. and 4:30 p.m.  Voicemails left after 4:30 p.m. will not be returned until the following business day.  For prescription refill requests, have your pharmacy contact our office.

## 2014-12-27 ENCOUNTER — Ambulatory Visit (HOSPITAL_COMMUNITY)
Admission: RE | Admit: 2014-12-27 | Discharge: 2014-12-27 | Disposition: A | Discharge status: Home / Self Care | Payer: BLUE CROSS/BLUE SHIELD | Source: Ambulatory Visit | Attending: Obstetrics & Gynecology | Admitting: Obstetrics & Gynecology

## 2014-12-27 ENCOUNTER — Encounter (HOSPITAL_COMMUNITY): Payer: Self-pay | Admitting: Hematology & Oncology

## 2014-12-27 DIAGNOSIS — Z1231 Encounter for screening mammogram for malignant neoplasm of breast: Secondary | ICD-10-CM

## 2014-12-28 ENCOUNTER — Encounter (HOSPITAL_COMMUNITY): Payer: Self-pay

## 2014-12-28 ENCOUNTER — Encounter (HOSPITAL_BASED_OUTPATIENT_CLINIC_OR_DEPARTMENT_OTHER): Payer: BLUE CROSS/BLUE SHIELD

## 2014-12-28 DIAGNOSIS — D500 Iron deficiency anemia secondary to blood loss (chronic): Secondary | ICD-10-CM

## 2014-12-28 MED ORDER — SODIUM CHLORIDE 0.9 % IV SOLN
INTRAVENOUS | Status: DC
  Administered 2014-12-28: 11:00:00 via INTRAVENOUS

## 2014-12-28 MED ORDER — SODIUM CHLORIDE 0.9 % IV SOLN
125.0000 mg | Freq: Once | INTRAVENOUS | Status: AC
  Administered 2014-12-28: 125 mg via INTRAVENOUS
  Filled 2014-12-28: qty 10

## 2014-12-28 NOTE — Progress Notes (Signed)
1230:  Tolerated infusion w/o adverse reaction.  VSS.  Discharged ambulatory.

## 2014-12-28 NOTE — Patient Instructions (Signed)
[facility] at [facility] Discharge Instructions  RECOMMENDATIONS MADE BY THE CONSULTANT AND ANY TEST RESULTS WILL BE SENT TO YOUR REFERRING PHYSICIAN.  Iron infusion today. Return as scheduled for lab work and office visit.    Thank you for choosing [facility] at [facility] to provide your oncology and hematology care.  To afford each patient quality time with our Camira Geidel, please arrive at least 15 minutes before your scheduled appointment time.    You need to re-schedule your appointment should you arrive 10 or more minutes late.  We strive to give you quality time with our providers, and arriving late affects you and other patients whose appointments are after yours.  Also, if you no show three or more times for appointments you may be dismissed from the clinic at the providers discretion.     Again, thank you for choosing [facility].  Our hope is that these requests will decrease the amount of time that you wait before being seen by our physicians.       _____________________________________________________________  Should you have questions after your visit to [facility], please contact our office at [phone] between the hours of 8:30 a.m. and 4:30 p.m.  Voicemails left after 4:30 p.m. will not be returned until the following business day.  For prescription refill requests, have your pharmacy contact our office.    

## 2014-12-31 ENCOUNTER — Ambulatory Visit (HOSPITAL_COMMUNITY)
Admission: RE | Admit: 2014-12-31 | Discharge: 2014-12-31 | Disposition: A | Discharge status: Home / Self Care | Payer: BLUE CROSS/BLUE SHIELD | Source: Ambulatory Visit | Attending: Gastroenterology | Admitting: Gastroenterology

## 2014-12-31 ENCOUNTER — Other Ambulatory Visit: Payer: Self-pay | Admitting: Gastroenterology

## 2014-12-31 DIAGNOSIS — K8020 Calculus of gallbladder without cholecystitis without obstruction: Secondary | ICD-10-CM | POA: Diagnosis not present

## 2014-12-31 DIAGNOSIS — K7030 Alcoholic cirrhosis of liver without ascites: Secondary | ICD-10-CM | POA: Insufficient documentation

## 2014-12-31 NOTE — Progress Notes (Signed)
NIC  

## 2015-01-04 ENCOUNTER — Other Ambulatory Visit: Payer: Self-pay | Admitting: Family Medicine

## 2015-01-15 ENCOUNTER — Ambulatory Visit: Payer: BLUE CROSS/BLUE SHIELD | Admitting: Orthopedic Surgery

## 2015-01-22 ENCOUNTER — Other Ambulatory Visit: Payer: Self-pay | Admitting: Obstetrics & Gynecology

## 2015-01-22 DIAGNOSIS — R928 Other abnormal and inconclusive findings on diagnostic imaging of breast: Secondary | ICD-10-CM

## 2015-01-25 ENCOUNTER — Other Ambulatory Visit (HOSPITAL_COMMUNITY): Payer: Self-pay

## 2015-01-28 ENCOUNTER — Ambulatory Visit
Admission: RE | Admit: 2015-01-28 | Discharge: 2015-01-28 | Disposition: A | Discharge status: Home / Self Care | Payer: BLUE CROSS/BLUE SHIELD | Source: Ambulatory Visit | Attending: Obstetrics & Gynecology | Admitting: Obstetrics & Gynecology

## 2015-01-28 DIAGNOSIS — R928 Other abnormal and inconclusive findings on diagnostic imaging of breast: Secondary | ICD-10-CM

## 2015-01-30 ENCOUNTER — Encounter (HOSPITAL_COMMUNITY): Payer: BLUE CROSS/BLUE SHIELD | Attending: Hematology & Oncology

## 2015-01-30 ENCOUNTER — Other Ambulatory Visit (HOSPITAL_COMMUNITY): Payer: BLUE CROSS/BLUE SHIELD

## 2015-01-30 DIAGNOSIS — K7469 Other cirrhosis of liver: Secondary | ICD-10-CM | POA: Diagnosis present

## 2015-01-30 DIAGNOSIS — D500 Iron deficiency anemia secondary to blood loss (chronic): Secondary | ICD-10-CM

## 2015-01-30 LAB — COMPREHENSIVE METABOLIC PANEL
ALT: 19 U/L (ref 14–54)
ANION GAP: 8 (ref 5–15)
AST: 26 U/L (ref 15–41)
Albumin: 4.3 g/dL (ref 3.5–5.0)
Alkaline Phosphatase: 84 U/L (ref 38–126)
BILIRUBIN TOTAL: 0.8 mg/dL (ref 0.3–1.2)
BUN: 15 mg/dL (ref 6–20)
CO2: 27 mmol/L (ref 22–32)
Calcium: 9.8 mg/dL (ref 8.9–10.3)
Chloride: 104 mmol/L (ref 101–111)
Creatinine, Ser: 1.11 mg/dL — ABNORMAL HIGH (ref 0.44–1.00)
GFR calc Af Amer: >60 mL/min (ref >60)
GFR, EST NON AFRICAN AMERICAN: 54 mL/min — AB (ref >60)
Glucose, Bld: 91 mg/dL (ref 65–99)
POTASSIUM: 4.1 mmol/L (ref 3.5–5.1)
Sodium: 139 mmol/L (ref 135–145)
TOTAL PROTEIN: 8.3 g/dL — AB (ref 6.5–8.1)

## 2015-01-30 LAB — CBC WITH DIFFERENTIAL/PLATELET
BASOS ABS: 0.1 10*3/uL (ref 0.0–0.1)
Basophils Relative: 1 %
EOS ABS: 0.3 10*3/uL (ref 0.0–0.7)
EOS PCT: 3 %
HCT: 44.5 % (ref 36.0–46.0)
Hemoglobin: 14.6 g/dL (ref 12.0–15.0)
LYMPHS ABS: 1.8 10*3/uL (ref 0.7–4.0)
Lymphocytes Relative: 22 %
MCH: 30.4 pg (ref 26.0–34.0)
MCHC: 32.8 g/dL (ref 30.0–36.0)
MCV: 92.7 fL (ref 78.0–100.0)
Monocytes Absolute: 0.6 10*3/uL (ref 0.1–1.0)
Monocytes Relative: 7 %
Neutro Abs: 5.6 10*3/uL (ref 1.7–7.7)
Neutrophils Relative %: 67 %
Platelets: 256 10*3/uL (ref 150–400)
RBC: 4.8 MIL/uL (ref 3.87–5.11)
RDW: 14.3 % (ref 11.5–15.5)
WBC: 8.3 10*3/uL (ref 4.0–10.5)

## 2015-01-31 LAB — FERRITIN: Ferritin: 65 ng/mL (ref 11–307)

## 2015-01-31 LAB — IRON AND TIBC
Iron: 79 ug/dL (ref 28–170)
SATURATION RATIOS: 18 % (ref 10.4–31.8)
TIBC: 437 ug/dL (ref 250–450)
UIBC: 358 ug/dL

## 2015-02-01 NOTE — Progress Notes (Signed)
LABS DRAWN

## 2015-02-08 ENCOUNTER — Other Ambulatory Visit (HOSPITAL_COMMUNITY): Payer: Self-pay | Admitting: Hematology & Oncology

## 2015-02-11 ENCOUNTER — Encounter: Payer: Self-pay | Admitting: Gastroenterology

## 2015-02-11 ENCOUNTER — Other Ambulatory Visit: Payer: Self-pay | Admitting: Family Medicine

## 2015-02-11 ENCOUNTER — Other Ambulatory Visit: Payer: Self-pay

## 2015-02-11 DIAGNOSIS — D500 Iron deficiency anemia secondary to blood loss (chronic): Secondary | ICD-10-CM

## 2015-02-12 ENCOUNTER — Other Ambulatory Visit: Payer: Self-pay

## 2015-02-12 DIAGNOSIS — K7030 Alcoholic cirrhosis of liver without ascites: Secondary | ICD-10-CM

## 2015-02-15 LAB — CBC WITH DIFFERENTIAL/PLATELET
BASOS ABS: 0.1 10*3/uL (ref 0.0–0.1)
BASOS PCT: 1 % (ref 0–1)
EOS ABS: 0.2 10*3/uL (ref 0.0–0.7)
Eosinophils Relative: 3 % (ref 0–5)
HCT: 41.6 % (ref 36.0–46.0)
Hemoglobin: 14.1 g/dL (ref 12.0–15.0)
Lymphocytes Relative: 20 % (ref 12–46)
Lymphs Abs: 1.5 10*3/uL (ref 0.7–4.0)
MCH: 30.5 pg (ref 26.0–34.0)
MCHC: 33.9 g/dL (ref 30.0–36.0)
MCV: 89.8 fL (ref 78.0–100.0)
MPV: 10.6 fL (ref 8.6–12.4)
Monocytes Absolute: 0.8 10*3/uL (ref 0.1–1.0)
Monocytes Relative: 10 % (ref 3–12)
NEUTROS PCT: 66 % (ref 43–77)
Neutro Abs: 5.1 10*3/uL (ref 1.7–7.7)
PLATELETS: 235 10*3/uL (ref 150–400)
RBC: 4.63 MIL/uL (ref 3.87–5.11)
RDW: 14.3 % (ref 11.5–15.5)
WBC: 7.7 10*3/uL (ref 4.0–10.5)

## 2015-02-16 LAB — COMPLETE METABOLIC PANEL WITH GFR
ALT: 14 U/L (ref 6–29)
AST: 22 U/L (ref 10–35)
Albumin: 4.3 g/dL (ref 3.6–5.1)
Alkaline Phosphatase: 85 U/L (ref 33–130)
BILIRUBIN TOTAL: 0.6 mg/dL (ref 0.2–1.2)
BUN: 12 mg/dL (ref 7–25)
CHLORIDE: 100 mmol/L (ref 98–110)
CO2: 27 mmol/L (ref 20–31)
CREATININE: 1.05 mg/dL (ref 0.50–1.05)
Calcium: 9.7 mg/dL (ref 8.6–10.4)
GFR, EST NON AFRICAN AMERICAN: 59 mL/min — AB (ref >=60)
GFR, Est African American: 68 mL/min (ref >=60)
GLUCOSE: 83 mg/dL (ref 65–99)
Potassium: 3.8 mmol/L (ref 3.5–5.3)
SODIUM: 139 mmol/L (ref 135–146)
TOTAL PROTEIN: 7.4 g/dL (ref 6.1–8.1)

## 2015-02-16 LAB — AFP TUMOR MARKER: AFP TUMOR MARKER: 6.2 ng/mL — AB (ref <6.1)

## 2015-02-16 LAB — PROTIME-INR
INR: 1.18 (ref <1.50)
Prothrombin Time: 15.2 seconds (ref 11.6–15.2)

## 2015-02-25 ENCOUNTER — Encounter (HOSPITAL_BASED_OUTPATIENT_CLINIC_OR_DEPARTMENT_OTHER): Payer: BLUE CROSS/BLUE SHIELD

## 2015-02-25 VITALS — BP 113/67 | HR 78 | Temp 97.9°F | Resp 20

## 2015-02-25 DIAGNOSIS — D500 Iron deficiency anemia secondary to blood loss (chronic): Secondary | ICD-10-CM

## 2015-02-25 MED ORDER — SODIUM CHLORIDE 0.9 % IV SOLN
INTRAVENOUS | Status: DC
  Administered 2015-02-25: 13:00:00 via INTRAVENOUS

## 2015-02-25 MED ORDER — SODIUM CHLORIDE 0.9 % IV SOLN
510.0000 mg | Freq: Once | INTRAVENOUS | Status: AC
  Administered 2015-02-25: 510 mg via INTRAVENOUS
  Filled 2015-02-25: qty 17

## 2015-02-25 NOTE — Patient Instructions (Signed)
[place] at [facility] Discharge Instructions  RECOMMENDATIONS MADE BY THE CONSULTANT AND ANY TEST RESULTS WILL BE SENT TO YOUR REFERRING PHYSICIAN.  Feraheme today.    Thank you for choosing [place] at [facility] to provide your oncology and hematology care.  To afford each patient quality time with our provider, please arrive at least 15 minutes before your scheduled appointment time.    You need to re-schedule your appointment should you arrive 10 or more minutes late.  We strive to give you quality time with our providers, and arriving late affects you and other patients whose appointments are after yours.  Also, if you no show three or more times for appointments you may be dismissed from the clinic at the providers discretion.     Again, thank you for choosing [facility].  Our hope is that these requests will decrease the amount of time that you wait before being seen by our physicians.       _____________________________________________________________  Should you have questions after your visit to [facility], please contact our office at [phone] between the hours of 8:30 a.m. and 4:30 p.m.  Voicemails left after 4:30 p.m. will not be returned until the following business day.  For prescription refill requests, have your pharmacy contact our office.

## 2015-02-26 ENCOUNTER — Telehealth: Payer: Self-pay | Admitting: Gastroenterology

## 2015-02-26 NOTE — Telephone Encounter (Signed)
PLEASE CALL PT. HER BLOOD COUNT & LIVER PANEL ARE NORMAL. HER AFP IS SLIGHTLY ELEVATED AT 6.3. REPEAT LABS AND U/S IN 6 MOS.

## 2015-02-27 ENCOUNTER — Other Ambulatory Visit: Payer: Self-pay

## 2015-02-27 DIAGNOSIS — K7460 Unspecified cirrhosis of liver: Secondary | ICD-10-CM

## 2015-02-27 NOTE — Telephone Encounter (Signed)
Reminder in epic °

## 2015-02-27 NOTE — Telephone Encounter (Signed)
Pt is aware of results and Lab orders on file for 08/27/2015.

## 2015-03-07 ENCOUNTER — Ambulatory Visit (INDEPENDENT_AMBULATORY_CARE_PROVIDER_SITE_OTHER): Payer: BLUE CROSS/BLUE SHIELD | Admitting: Gastroenterology

## 2015-03-07 ENCOUNTER — Encounter: Payer: Self-pay | Admitting: Gastroenterology

## 2015-03-07 VITALS — BP 132/89 | HR 94 | Temp 97.0°F | Ht 68.0 in | Wt 239.0 lb

## 2015-03-07 DIAGNOSIS — K7030 Alcoholic cirrhosis of liver without ascites: Secondary | ICD-10-CM | POA: Diagnosis not present

## 2015-03-07 NOTE — Progress Notes (Signed)
ON RECALL  °

## 2015-03-07 NOTE — Patient Instructions (Addendum)
REPEAT CBC, CMP, PT/INR, OR AFP IN MAY 2016.  REPEAT ULTRASOUND IN MAY 2016.  FOLLOW UP IN 6 MOS.

## 2015-03-07 NOTE — Progress Notes (Signed)
cc'ed to pcp °

## 2015-03-07 NOTE — Assessment & Plan Note (Addendum)
WELL COMPENSATED DISEASE.  REPEAT CBC, CMP, PT/INR, OR AFP IN MAY 2016. REPEAT ULTRASOUND IN MAY 2016. FOLLOW UP IN 6 MOS.

## 2015-03-07 NOTE — Progress Notes (Signed)
Subjective:    Patient ID: Deanna Thompson, female    DOB: [birth year], 59 y.o.   MRN: [id number] [name], MD  HPI LAST SEEN BY [facility] NOV 2016. HAVING CONTIPATION FREQUENTLY.  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 SEEING MENTAL HEALTH AND ITS HELPING. TROUBLE WITH SWALLOWING ASPIRATES FOOD AND RINKS AND COUGHS LIKE A MAD PERSON. USED TO HAPPEN Q3-6 MOS. CAN PREPARE HERSELF AND CHANGE THE WAY SHE SWALLOWS Q2-3 WEEKS. HEARTBURN CONTROLLED WITH RANITIDINE.PT DENIES FEVER, CHILLS, HEMATOCHEZIA, HEMATEMESIS, nausea, vomiting, melena, diarrhea, CHEST PAIN, SHORTNESS OF BREATH,  CHANGE IN BOWEL IN HABITS, OR abdominal pain.  Past Medical History  Diagnosis Date  . IBS (irritable bowel syndrome)   . GERD (gastroesophageal reflux disease)   . Panic attack   . Anxiety   . Psoriatic arthritis   . ETOH abuse     Quit in 07/2011   . Sjogren's disease   . Cirrhosis     Presumably alcoholic cirrhosis  . Complication of anesthesia   . PONV (postoperative nausea and vomiting)   . Abdominal hernia   . 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 Seizures 1997    1 seizure from coil insertion but no more seizures and no meds for seizures  . HTN (hypertension)     no longer medicated, dated on 04/25/12  . Fatigue     due to Sjogren's disease  . Arthritis     back  and down flank  right side more than left  . Ascites 08/27/2011    FEB 2014 10.7 L   . Right inguinal hernia 08/16/2012  . [place] HEAD 02/03/2007    Qualifier: Diagnosis of  By: Aline Brochure MD, Dorothyann Peng    . Alcoholic hepatitis [id number]  . Abscess of pulp of tooth 12/12/14    left side  . Muscle spasm of left shoulder area    Past Surgical History  Procedure Laterality Date  . Esophageal biopsy  10/13/2011    SLF: mild gastritis/duodenal polypoid lesion   . Colonoscopy  10/13/2011    [id number] hemorrhoids/varices rectal/lesion in ascending colon(HYPERPLASTIC POLYP)  .  Paracentesis  Feb 2014    10.7 liters  . Colonoscopy N/A 01/17/2013    SLF: 1. Internal hemorrhoids 2. Ascending colon polyps 3.  Rectal varices.   . Cerebral aneurysm repair  1997  . Umbilical hernia repair N/A 06/09/2013    Procedure: UMBILICAL HERNIORRHAPHY WITH MESH;  Surgeon: Jamesetta So, MD;  Location: [facility];  Service: General;  Laterality: N/A;  . Insertion of mesh N/A 06/09/2013    Procedure: INSERTION OF MESH;  Surgeon: Jamesetta So, MD;  Location: [facility];  Service: General;  Laterality: N/A;  . Inguinal hernia repair Right 08/28/2013    Procedure: RIGHT NGUINAL HERNIORRHAPHY WITH MESH;  Surgeon: Jamesetta So, MD;  Location: [facility];  Service: General;  Laterality: Right;  . Insertion of mesh Right 08/28/2013    Procedure: INSERTION OF MESH;  Surgeon: Jamesetta So, MD;  Location: [facility];  Service: General;  Laterality: Right;  . Agile capsule N/A 06/21/2014    Procedure: AGILE CAPSULE;  Surgeon: Danie Binder, MD;  [facility];  Service: Endoscopy;  Laterality: N/A;  0700  . Esophagogastroduodenoscopy N/A 06/29/2014    SLF: 1. anemia due to polypoid lesions in teh antrum, and duodenal polyps 2. single duodenum AVM  . Givens capsule study N/A 06/29/2014  Procedure: GIVENS CAPSULE STUDY;  Surgeon: Danie Binder, MD;  [facility];  Service: Endoscopy;  Laterality: N/A;  . Givens capsule study N/A 07/13/2014    Procedure: GIVENS CAPSULE STUDY;  Surgeon: Danie Binder, MD;  [facility];  Service: Endoscopy;  Laterality: N/A;  700  . Tooth extraction Left 12/12/14   Allergies  Allergen Reactions  . Omeprazole Shortness Of Breath  . Other Shortness Of Breath and Rash    Red Meat Causes shortness of breath, rash and flu like symptoms.   . Phenytoin Rash  . Keflex [Cephalexin] Other (See Comments)    Severe yeast infection  . Phenergan [Promethazine Hcl] Other (See Comments)    Not in right state of mind, HALLUCINATION   Current Outpatient  Prescriptions  Medication Sig Dispense Refill  . acetaminophen (TYLENOL) 500 MG tablet Take 1,000 mg by mouth every 6 (six) hours as needed for moderate pain.    Marland Kitchen amitriptyline (ELAVIL) 10 MG tablet Take 1 tablet (10 mg total) by mouth at bedtime.    . bumetanide (BUMEX) 1 MG tablet TAKE (1) TABLET BY MOUTH (2) TIMES DAILY.    Marland Kitchen buPROPion (WELLBUTRIN XL) 150 MG 24 hr tablet Take 1 tablet (150 mg total) by mouth at bedtime.    . carboxymethylcellulose (REFRESH PLUS) 0.5 % SOLN Place 2 drops into both eyes 3 (three) times daily as needed (Dry Eyes).     Marland Kitchen docusate sodium (COLACE) 100 MG capsule Take 100 mg by mouth daily.    Marland Kitchen escitalopram (LEXAPRO) 20 MG tablet TAKE (1) TABLET BY MOUTH AT BEDTIME.    Marland Kitchen loratadine (CLARITIN) 10 MG tablet Take 10 mg by mouth daily as needed for allergies.     . Multiple Vitamin (MULTIVITAMIN WITH MINERALS) TABS tablet Take 1 tablet by mouth daily.    Marland Kitchen NEXIUM 40 MG capsule TAKE 1 CAPSULE DAILY BEFORE BREAKFAST    . sodium chloride (OCEAN) 0.65 % nasal spray Place 1 spray into the nose as needed for congestion.    Marland Kitchen spironolactone (ALDACTONE) 100 MG tablet TAKE (1) TABLET BY MOUTH TWICE DAILY.    Marland Kitchen tiZANidine (ZANAFLEX) 4 MG tablet Take 1 tablet (4 mg total) by mouth every 6 (six) hours as needed for muscle spasms.    Marland Kitchen XIFAXAN 550 MG TABS tablet TAKE (1) TABLET BY MOUTH TWICE DAILY.     Review of Systems PER HPI OTHERWISE ALL SYSTEMS ARE NEGATIVE.    Objective:   Physical Exam  Constitutional: She is oriented to person, place, and time. She appears well-developed and well-nourished. No distress.  HENT:  Head: Normocephalic and atraumatic.  Mouth/Throat: Oropharynx is clear and moist. No oropharyngeal exudate.  Eyes: Pupils are equal, round, and reactive to light. No scleral icterus.  Neck: Normal range of motion. Neck supple.  Cardiovascular: Normal rate, regular rhythm and normal heart sounds.   Pulmonary/Chest: Effort normal and breath sounds normal. No  respiratory distress.  Abdominal: Soft. Bowel sounds are normal. She exhibits no distension. There is no tenderness.  Musculoskeletal: She exhibits no edema.  Lymphadenopathy:    She has no cervical adenopathy.  Neurological: She is alert and oriented to person, place, and time.  NO FOCAL DEFICITS  Psychiatric: She has a normal mood and affect.  Vitals reviewed.     Assessment & Plan:

## 2015-03-13 ENCOUNTER — Encounter (HOSPITAL_COMMUNITY): Payer: BLUE CROSS/BLUE SHIELD

## 2015-03-13 ENCOUNTER — Other Ambulatory Visit (HOSPITAL_COMMUNITY): Payer: BLUE CROSS/BLUE SHIELD

## 2015-03-14 ENCOUNTER — Other Ambulatory Visit: Payer: Self-pay | Admitting: Family Medicine

## 2015-03-28 ENCOUNTER — Encounter (HOSPITAL_COMMUNITY): Payer: BLUE CROSS/BLUE SHIELD | Attending: Hematology & Oncology

## 2015-03-28 DIAGNOSIS — D500 Iron deficiency anemia secondary to blood loss (chronic): Secondary | ICD-10-CM | POA: Insufficient documentation

## 2015-03-28 DIAGNOSIS — K7469 Other cirrhosis of liver: Secondary | ICD-10-CM | POA: Insufficient documentation

## 2015-04-10 ENCOUNTER — Ambulatory Visit (HOSPITAL_COMMUNITY): Payer: BLUE CROSS/BLUE SHIELD | Admitting: Hematology & Oncology

## 2015-04-15 ENCOUNTER — Other Ambulatory Visit: Payer: Self-pay | Admitting: Gastroenterology

## 2015-04-15 ENCOUNTER — Other Ambulatory Visit: Payer: Self-pay | Admitting: Family Medicine

## 2015-04-15 ENCOUNTER — Encounter: Payer: Self-pay | Admitting: Family Medicine

## 2015-04-15 ENCOUNTER — Ambulatory Visit (INDEPENDENT_AMBULATORY_CARE_PROVIDER_SITE_OTHER): Payer: BLUE CROSS/BLUE SHIELD | Admitting: Family Medicine

## 2015-04-15 VITALS — BP 116/72 | Ht 68.0 in | Wt 246.0 lb

## 2015-04-15 DIAGNOSIS — L4050 Arthropathic psoriasis, unspecified: Secondary | ICD-10-CM | POA: Diagnosis not present

## 2015-04-15 DIAGNOSIS — F32A Depression, unspecified: Secondary | ICD-10-CM

## 2015-04-15 DIAGNOSIS — F329 Major depressive disorder, single episode, unspecified: Secondary | ICD-10-CM

## 2015-04-15 DIAGNOSIS — G4700 Insomnia, unspecified: Secondary | ICD-10-CM | POA: Diagnosis not present

## 2015-04-15 MED ORDER — AMITRIPTYLINE HCL 10 MG PO TABS: 10.0000 mg | ORAL_TABLET | Freq: Every day | ORAL | Status: DC

## 2015-04-15 MED ORDER — BUPROPION HCL ER (SR) 100 MG PO TB12: 100.0000 mg | ORAL_TABLET | Freq: Two times a day (BID) | ORAL | Status: DC

## 2015-04-15 MED ORDER — ESCITALOPRAM OXALATE 20 MG PO TABS: ORAL_TABLET | ORAL | Status: DC

## 2015-04-15 NOTE — Progress Notes (Signed)
   Subjective:    Patient ID: Deanna Thompson, female    DOB: 16-May-1955, 60 y.o.   MRN: [id number]  HPIMed check up on depression. Pt feels like wellbutrin is not helping.   Pt wants to discuss Test results from RA specialist. Pt brought in results with her.   depr off and on ,not doing aw well, hoplessness and anger, and a real "strong negative tape" of inward thoughts  Now on lexapro , and wellbutrin and keeping the same dose  Thinks negative a lot,   Uses 10 mg qhs for  Amitriptyline primarily for myofascial pain along with insomnia  , medicine definitely helps  Patient went into a very lengthy description of her depression. She felt that Wellbutrin 300 mg is too much but now 150 mg has not a not. Makes a point that her mental functioning isn't quite what she had hoped. Cannot recall any recent triggers. Does note that often the winter things don't do as well. Went into very exhausted history of all of her antidepressant meds and their shortcomings down through the years next  Has never been diagnosis bipolar  Review of Systems  no headache no chest pain no back pain no abdominal pain    Objective:   Physical Exam   alert vitals stable very talkative HEENT normal lungs clear heart regular in rhythm.      Assessment & Plan:   impression depression somewhat worse discussed #2 myofascial pain and insomnia. #3 elevated a and a in double-stranded DNA test, patient brought these in and wondered my thoughts, advised patient they are very difficult to interpret. Sometimes folks with other condition such as her confirm  psoriatic arthritis can lead to blood work abnormalities discussed length plan 25 minutes spent most in discussion switch to Wellbutrin 100 SR twice a day rationale discussed exercise encouraged follow-up in 6 months WSL

## 2015-04-24 ENCOUNTER — Encounter (HOSPITAL_COMMUNITY): Payer: BLUE CROSS/BLUE SHIELD | Attending: Hematology & Oncology

## 2015-04-24 ENCOUNTER — Other Ambulatory Visit (HOSPITAL_COMMUNITY): Payer: BLUE CROSS/BLUE SHIELD

## 2015-04-24 ENCOUNTER — Encounter (HOSPITAL_BASED_OUTPATIENT_CLINIC_OR_DEPARTMENT_OTHER): Payer: BLUE CROSS/BLUE SHIELD | Admitting: Hematology & Oncology

## 2015-04-24 ENCOUNTER — Encounter (HOSPITAL_COMMUNITY): Payer: Self-pay | Admitting: Hematology & Oncology

## 2015-04-24 VITALS — BP 127/71 | HR 84 | Temp 99.1°F | Resp 18 | Wt 244.6 lb

## 2015-04-24 DIAGNOSIS — D500 Iron deficiency anemia secondary to blood loss (chronic): Secondary | ICD-10-CM | POA: Diagnosis not present

## 2015-04-24 DIAGNOSIS — K7030 Alcoholic cirrhosis of liver without ascites: Secondary | ICD-10-CM | POA: Diagnosis not present

## 2015-04-24 DIAGNOSIS — K7469 Other cirrhosis of liver: Secondary | ICD-10-CM | POA: Diagnosis present

## 2015-04-24 LAB — CBC WITH DIFFERENTIAL/PLATELET
BASOS PCT: 0 %
Basophils Absolute: 0 10*3/uL (ref 0.0–0.1)
EOS ABS: 0.3 10*3/uL (ref 0.0–0.7)
EOS PCT: 3 %
HCT: 44.5 % (ref 36.0–46.0)
Hemoglobin: 15.1 g/dL — ABNORMAL HIGH (ref 12.0–15.0)
LYMPHS ABS: 1.9 10*3/uL (ref 0.7–4.0)
Lymphocytes Relative: 22 %
MCH: 31.5 pg (ref 26.0–34.0)
MCHC: 33.9 g/dL (ref 30.0–36.0)
MCV: 92.7 fL (ref 78.0–100.0)
MONOS PCT: 8 %
Monocytes Absolute: 0.7 10*3/uL (ref 0.1–1.0)
Neutro Abs: 5.9 10*3/uL (ref 1.7–7.7)
Neutrophils Relative %: 67 %
PLATELETS: 252 10*3/uL (ref 150–400)
RBC: 4.8 MIL/uL (ref 3.87–5.11)
RDW: 14.5 % (ref 11.5–15.5)
WBC: 8.8 10*3/uL (ref 4.0–10.5)

## 2015-04-24 LAB — COMPREHENSIVE METABOLIC PANEL
ALT: 18 U/L (ref 14–54)
ANION GAP: 9 (ref 5–15)
AST: 25 U/L (ref 15–41)
Albumin: 4.3 g/dL (ref 3.5–5.0)
Alkaline Phosphatase: 82 U/L (ref 38–126)
BUN: 13 mg/dL (ref 6–20)
CALCIUM: 9.7 mg/dL (ref 8.9–10.3)
CHLORIDE: 103 mmol/L (ref 101–111)
CO2: 27 mmol/L (ref 22–32)
Creatinine, Ser: 1.19 mg/dL — ABNORMAL HIGH (ref 0.44–1.00)
GFR calc non Af Amer: 49 mL/min — ABNORMAL LOW (ref >60)
GFR, EST AFRICAN AMERICAN: 57 mL/min — AB (ref >60)
Glucose, Bld: 75 mg/dL (ref 65–99)
POTASSIUM: 3.7 mmol/L (ref 3.5–5.1)
SODIUM: 139 mmol/L (ref 135–145)
Total Bilirubin: 0.8 mg/dL (ref 0.3–1.2)
Total Protein: 8.1 g/dL (ref 6.5–8.1)

## 2015-04-24 LAB — IRON AND TIBC
Iron: 95 ug/dL (ref 28–170)
SATURATION RATIOS: 24 % (ref 10.4–31.8)
TIBC: 389 ug/dL (ref 250–450)
UIBC: 294 ug/dL

## 2015-04-24 LAB — FERRITIN: FERRITIN: 98 ng/mL (ref 11–307)

## 2015-04-24 MED ORDER — FERROUS SULFATE 300 (60 FE) MG/5ML PO SYRP: 300.0000 mg | ORAL_SOLUTION | Freq: Two times a day (BID) | ORAL | Status: DC

## 2015-04-24 NOTE — Patient Instructions (Signed)
[place] at [facility] Discharge Instructions  RECOMMENDATIONS MADE BY THE CONSULTANT AND ANY TEST RESULTS WILL BE SENT TO YOUR REFERRING PHYSICIAN.    Exam and discussion by  Liquid iron sent to your pharmacy, mix in 4 oz of orange juice, call if this doesn't work Blood work is pending, we will call you with the results  Labs every 6 weeks Return to see the doctor in 4 months  PLease call the clinic if you have any questions or concerns   Thank you for choosing [place] at [facility] to provide your oncology and hematology care.  To afford each patient quality time with our provider, please arrive at least 15 minutes before your scheduled appointment time.   Beginning January 23rd 2017 lab work for the [facility] will be done in the  Main lab at [facility] on 1st floor. If you have a lab appointment with the [facility] please come in thru the  Main Entrance and check in at the main information desk  You need to re-schedule your appointment should you arrive 10 or more minutes late.  We strive to give you quality time with our providers, and arriving late affects you and other patients whose appointments are after yours.  Also, if you no show three or more times for appointments you may be dismissed from the clinic at the providers discretion.     Again, thank you for choosing [facility].  Our hope is that these requests will decrease the amount of time that you wait before being seen by our physicians.       _____________________________________________________________  Should you have questions after your visit to [facility], please contact our office at [phone] between the hours of 8:30 a.m. and 4:30 p.m.  Voicemails left after 4:30 p.m. will not be returned until the following business day.  For prescription refill requests, have your pharmacy contact our office.

## 2015-04-24 NOTE — Progress Notes (Signed)
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 female returns for iron deficiency and anemia.  Deanna Thompson is here alone today.  Deanna Thompson indicates that Deanna Thompson has had trouble taking iron pills in the past. Deanna Thompson is okay with trying liquid iron instead of the pills. Deanna Thompson wants to try oral iron because of the costs associated with IV replacement. Deanna Thompson denies any bleeding from the rectal area.  Deanna Thompson comments that Deanna Thompson went to the rheumatologist and discovered that her ANA is positive and that her "anti-DNA-DS .Marland Kitchen. it ends with QN" is very high. Deanna Thompson says Deanna Thompson doesn't have the Sjgren's antibody anymore, but Deanna Thompson still has all of the symptoms.   Deanna Thompson says Deanna Thompson loves the GI doctor Deanna Thompson saw at [facility]. Deanna Thompson is without other complaints or concerns today.   MEDICAL HISTORY: Past Medical History  Diagnosis Date  . IBS (irritable bowel syndrome)   . GERD (gastroesophageal reflux disease)   . Panic attack   . Anxiety   . Psoriatic arthritis ([place])   . ETOH abuse     Quit in 07/2011   . Sjogren's disease ([place])   . Cirrhosis ([place])     Presumably alcoholic cirrhosis  . Complication of anesthesia   . PONV (postoperative nausea and vomiting)   . Abdominal hernia   . 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 Seizures ([place]) 1997    1 seizure from coil insertion but no more seizures and no meds for seizures  . HTN (hypertension)     no longer medicated, dated on 04/25/12  . Fatigue     due to Sjogren's disease  . Arthritis    back  and down flank  right side more than left  . Ascites 08/27/2011    FEB 2014 10.7 L   . Right inguinal hernia 08/16/2012  . [place] HEAD 02/03/2007    Qualifier: Diagnosis of  By: Aline Brochure MD, Dorothyann Peng    . Alcoholic hepatitis [id number]  . Abscess of pulp of tooth 12/12/14    left side  . Muscle spasm of left shoulder area     has Cirrhosis ([place]); Colon polyp, hyperplastic; Rash and nonspecific skin eruption; Labial swelling; Depression; Insomnia; Iron deficiency anemia due to chronic blood loss; and Psoriatic arthritis ([place]) on her problem list.     is allergic to omeprazole; other; phenytoin; keflex; and phenergan.  Deanna Thompson does not currently have medications on file.   SURGICAL HISTORY: Past Surgical History  Procedure Laterality Date  . Esophageal biopsy  10/13/2011    SLF: mild gastritis/duodenal polypoid lesion   . Colonoscopy  10/13/2011    [id number] hemorrhoids/varices rectal/lesion in ascending colon(HYPERPLASTIC POLYP)  . Paracentesis  Feb 2014    10.7 liters  . Colonoscopy N/A 01/17/2013    SLF: 1. Internal hemorrhoids 2. Ascending colon polyps 3.  Rectal varices.   . Cerebral aneurysm repair  1997  . Umbilical hernia repair N/A 06/09/2013    Procedure: UMBILICAL HERNIORRHAPHY WITH MESH;  Surgeon: Jamesetta So, MD;  Location:  [facility];  Service: General;  Laterality: N/A;  . Insertion of mesh N/A 06/09/2013    Procedure: INSERTION OF MESH;  Surgeon: Jamesetta So, MD;  Location: [facility];  Service: General;  Laterality: N/A;  . Inguinal hernia repair Right 08/28/2013    Procedure: RIGHT NGUINAL HERNIORRHAPHY WITH MESH;  Surgeon: Jamesetta So, MD;  Location: [facility];  Service: General;  Laterality: Right;  . Insertion of mesh Right 08/28/2013    Procedure: INSERTION OF MESH;  Surgeon: Jamesetta So, MD;  Location: [facility];  Service: General;  Laterality: Right;  . Agile capsule N/A 06/21/2014    Procedure: AGILE CAPSULE;  Surgeon: Danie Binder, MD;   [facility];  Service: Endoscopy;  Laterality: N/A;  0700  . Esophagogastroduodenoscopy N/A 06/29/2014    SLF: 1. anemia due to polypoid lesions in teh antrum, and duodenal polyps 2. single duodenum AVM  . Givens capsule study N/A 06/29/2014    Procedure: GIVENS CAPSULE STUDY;  Surgeon: Danie Binder, MD;  [facility];  Service: Endoscopy;  Laterality: N/A;  . Givens capsule study N/A 07/13/2014    Procedure: GIVENS CAPSULE STUDY;  Surgeon: Danie Binder, MD;  [facility];  Service: Endoscopy;  Laterality: N/A;  700  . Tooth extraction Left 12/12/14    SOCIAL HISTORY: Social History   Social History  . Marital Status: Married    Spouse Name: N/A  . Number of Children: 0  . Years of Education: N/A   Occupational History  .     Social History Main Topics  . Smoking status: Former Smoker -- 2.00 packs/day for 12 years    Types: Cigarettes    Quit date: 03/31/1995  . Smokeless tobacco: Never Used     Comment: quit in 1997  . Alcohol Use: No     Comment: Bottle of wine a day for years, but quit in 07/2011  . Drug Use: No  . Sexual Activity: Yes    Birth Control/ Protection: Post-menopausal   Other Topics Concern  . Not on file   Social History Narrative   Quit drinking alcohol 3 weeks ago. No drugs, no IVDU.    Married 35 yrs. No children, retired from [employer]. Quit smoking in 1998 Had cerebral aneurysm Hasn't drank alcohol since 5/13, Deanna Thompson stated Deanna Thompson "let the chardonnay train go down the road" due to cirrhosis; Was previously drinking 3 bottles a day.    FAMILY HISTORY: Family History  Problem Relation Age of Onset  . Cirrhosis Mother     nash  . Colon cancer Neg Hx   . Inflammatory bowel disease Neg Hx   . Asthma    . Heart disease    . COPD Father   . Heart disease Father    Mother deceased at 77 yo, fatty liver/cirrhosis Father deceased at 50 yo, MI 1 brother 1 sister 1 sister deceased, had surgery had been on  xanax, coded - brain dead  Review of Systems  Constitutional: Negative for fever, chills, weight loss and malaise/fatigue.  HENT: Negative for congestion, hearing loss, nosebleeds, sore throat and tinnitus.   Eyes: Negative for blurred vision, double vision, pain and discharge.  Respiratory: Negative for cough, hemoptysis, sputum production, shortness of breath and wheezing.   Cardiovascular: Negative for chest pain, palpitations, claudication, leg swelling and PND.  Gastrointestinal: Positive for constipation. Negative for heartburn, nausea, vomiting, abdominal pain, diarrhea, blood in stool and melena.  Genitourinary: Negative for  dysuria, urgency, frequency and hematuria.  Musculoskeletal: Negative for myalgias, joint pain and falls.  Skin: Negative for itching and rash.  Neurological: Positive for dizziness and headaches. Negative for tingling, tremors, sensory change, speech change, focal weakness, seizures, loss of consciousness, and weakness.       Deanna Thompson attributes her dizziness to allergies. Headaches from Fibromyalgia. Endo/Heme/Allergies: Does not bruise/bleed easily.  Psychiatric/Behavioral: Positive for memory loss. Negative for depression, suicidal ideas and substance abuse. The patient is not nervous/anxious and does not have insomnia.   14 point review of systems was performed and is negative except as detailed under history of present illness and above   PHYSICAL EXAMINATION  ECOG PERFORMANCE STATUS: 1 - Symptomatic but completely ambulatory  Filed Vitals:   04/24/15 1130  BP: 127/71  Pulse: 84  Temp: 99.1 F (37.3 C)  Resp: 18    Physical Exam  Constitutional: Deanna Thompson is oriented to person, place, and time and well-developed, well-nourished, and in no distress.  HENT:  Head: Normocephalic and atraumatic.  Eyes: Pupils are equal, round, and reactive to light.  Neck: Normal range of motion. Neck supple.  Cardiovascular: Normal rate, regular rhythm and normal heart  sounds.   Pulmonary/Chest: Effort normal and breath sounds normal.  Abdominal: Soft. Bowel sounds are normal. No HSM, no rebound or guarding Musculoskeletal: Normal range of motion.  Neurological: Deanna Thompson is alert and oriented to person, place, and time. CN II - XII grossly intact, non focal Skin: Skin is warm and dry.    LABORATORY DATA: I have reviewed the data as listed.  CBC    Component Value Date/Time   WBC 8.8 04/24/2015 1100   RBC 4.80 04/24/2015 1100   RBC 4.56 08/03/2014 1420   HGB 15.1* 04/24/2015 1100   HCT 44.5 04/24/2015 1100   HCT 41 07/30/2011 0743   PLT 252 04/24/2015 1100   MCV 92.7 04/24/2015 1100   MCV 104.3 07/30/2011 0743   MCH 31.5 04/24/2015 1100   MCHC 33.9 04/24/2015 1100   RDW 14.5 04/24/2015 1100   LYMPHSABS 1.9 04/24/2015 1100   MONOABS 0.7 04/24/2015 1100   EOSABS 0.3 04/24/2015 1100   BASOSABS 0.0 04/24/2015 1100   CMP     Component Value Date/Time   NA 139 04/24/2015 1100   NA 138 07/30/2011 0741   K 3.7 04/24/2015 1100   K 3.8 07/30/2011 0741   CL 103 04/24/2015 1100   CO2 27 04/24/2015 1100   GLUCOSE 75 04/24/2015 1100   BUN 13 04/24/2015 1100   BUN 4 07/30/2011 0741   CREATININE 1.19* 04/24/2015 1100   CREATININE 1.05 02/15/2015 1228   CALCIUM 9.7 04/24/2015 1100   PROT 8.1 04/24/2015 1100   ALBUMIN 4.3 04/24/2015 1100   ALBUMIN 3.0 07/30/2011 0741   AST 25 04/24/2015 1100   AST 171 07/30/2011 0741   ALT 18 04/24/2015 1100   ALKPHOS 82 04/24/2015 1100   ALKPHOS 111 07/30/2011 0741   BILITOT 0.8 04/24/2015 1100   BILITOT 3.6 07/30/2011 0741   GFRNONAA 49* 04/24/2015 1100   GFRNONAA 59* 02/15/2015 1228   GFRAA 57* 04/24/2015 1100   GFRAA 68 02/15/2015 1228    ASSESSMENT and THERAPY PLAN:   Severe microcytic Anemia with serum ferritin 5 ng/ml Cirrhosis 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 Her blood counts are markedly improved. 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   We will continue with observation of her blood counts and iron levels on a 6 week basis. I will plan on seeing her back in 4 months. I notified her to call the clinic if Deanna Thompson develops any symptoms of her iron deficiency. Otherwise Deanna Thompson is doing well.  I have called her in liquid ferrous sulfate to try twice daily (in OJ) If Deanna Thompson has GI problems Deanna Thompson is to call and we can attempt another iron formulation.  All questions were answered. The patient knows to call the clinic with any problems, questions or concerns. We can certainly see the patient much sooner if necessary.  This document serves as a record of services personally performed by Ancil Linsey, MD. It was created on her behalf by Toni Amend, a trained medical scribe. The creation of this record is based on the scribe's personal observations and the provider's statements to them. This document has been checked and approved by the attending provider.  I have reviewed the above documentation for accuracy and completeness, and I agree with the above.  This note was electronically signed.  Ancil Linsey, MD

## 2015-05-02 ENCOUNTER — Encounter (HOSPITAL_COMMUNITY): Payer: Self-pay | Admitting: *Deleted

## 2015-05-07 ENCOUNTER — Other Ambulatory Visit: Payer: Self-pay | Admitting: Gastroenterology

## 2015-05-13 ENCOUNTER — Telehealth: Payer: Self-pay | Admitting: Gastroenterology

## 2015-05-13 NOTE — Telephone Encounter (Signed)
PATIENT ON MARCH RECALL FOR ULTRASOUND

## 2015-05-14 NOTE — Telephone Encounter (Signed)
Letter mailed

## 2015-05-28 ENCOUNTER — Ambulatory Visit (INDEPENDENT_AMBULATORY_CARE_PROVIDER_SITE_OTHER): Payer: BLUE CROSS/BLUE SHIELD | Admitting: Obstetrics & Gynecology

## 2015-05-28 ENCOUNTER — Encounter: Payer: Self-pay | Admitting: Obstetrics & Gynecology

## 2015-05-28 ENCOUNTER — Other Ambulatory Visit (HOSPITAL_COMMUNITY)
Admission: RE | Admit: 2015-05-28 | Discharge: 2015-05-28 | Disposition: A | Discharge status: Home / Self Care | Payer: BLUE CROSS/BLUE SHIELD | Source: Ambulatory Visit | Attending: Obstetrics & Gynecology | Admitting: Obstetrics & Gynecology

## 2015-05-28 VITALS — BP 122/80 | HR 86 | Ht 68.0 in | Wt 243.0 lb

## 2015-05-28 DIAGNOSIS — Z01419 Encounter for gynecological examination (general) (routine) without abnormal findings: Secondary | ICD-10-CM

## 2015-05-28 DIAGNOSIS — Z1151 Encounter for screening for human papillomavirus (HPV): Secondary | ICD-10-CM | POA: Diagnosis not present

## 2015-05-28 DIAGNOSIS — Z1212 Encounter for screening for malignant neoplasm of rectum: Secondary | ICD-10-CM | POA: Diagnosis not present

## 2015-05-28 DIAGNOSIS — Z1211 Encounter for screening for malignant neoplasm of colon: Secondary | ICD-10-CM

## 2015-05-29 LAB — CYTOLOGY - PAP

## 2015-05-29 NOTE — Progress Notes (Signed)
Patient ID: Deanna Thompson, female   DOB: 02/24/1956, 60 y.o.   MRN: [id number] Subjective:     Deanna Thompson is a 60 y.o. female here for a routine exam.  No LMP recorded. Patient is postmenopausal. G0P0 Birth Control Method:  Post menopausal Menstrual Calendar(currently): amenorrheic  Current complaints: none.   Current acute medical issues:  See below   Recent Gynecologic History No LMP recorded. Patient is postmenopausal. Last Pap: 2016,  normal Last mammogram: 2016,  normal  Past Medical History  Diagnosis Date  . IBS (irritable bowel syndrome)   . GERD (gastroesophageal reflux disease)   . Panic attack   . Anxiety   . Psoriatic arthritis ([place])   . ETOH abuse     Quit in 07/2011   . Sjogren's disease ([place])   . Cirrhosis ([place])     Presumably alcoholic cirrhosis  . Complication of anesthesia   . PONV (postoperative nausea and vomiting)   . Abdominal hernia   . 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 Seizures ([place]) 1997    1 seizure from coil insertion but no more seizures and no meds for seizures  . HTN (hypertension)     no longer medicated, dated on 04/25/12  . Fatigue     due to Sjogren's disease  . Arthritis     back  and down flank  right side more than left  . Right inguinal hernia 08/16/2012  . [place] HEAD 02/03/2007    Qualifier: Diagnosis of  By: Aline Brochure MD, Dorothyann Peng    . Abscess of pulp of tooth 12/12/14    left side  . Depression     Past Surgical History  Procedure Laterality Date  . Biopsy  10/13/2011    SLF: mild gastritis/duodenal polypoid lesion   . Colonoscopy  10/13/2011    [id number] hemorrhoids/varices rectal/lesion in ascending colon(HYPERPLASTIC POLYP)  . Paracentesis  Feb 2014    10.7 liters  . Colonoscopy N/A 01/17/2013    SLF: 1. Internal hemorrhoids 2. Ascending colon polyps 3.  Rectal varices.   . Cerebral aneurysm repair  1997  . Umbilical hernia repair N/A 06/09/2013    Procedure: UMBILICAL  HERNIORRHAPHY WITH MESH;  Surgeon: Jamesetta So, MD;  Location: [facility] ORS;  Service: General;  Laterality: N/A;  . Insertion of mesh N/A 06/09/2013    Procedure: INSERTION OF MESH;  Surgeon: Jamesetta So, MD;  Location: [facility] ORS;  Service: General;  Laterality: N/A;  . Inguinal hernia repair Right 08/28/2013    Procedure: RIGHT NGUINAL HERNIORRHAPHY WITH MESH;  Surgeon: Jamesetta So, MD;  Location: [facility] ORS;  Service: General;  Laterality: Right;  . Insertion of mesh Right 08/28/2013    Procedure: INSERTION OF MESH;  Surgeon: Jamesetta So, MD;  Location: [facility] ORS;  Service: General;  Laterality: Right;  . Agile capsule N/A 06/21/2014    Procedure: AGILE CAPSULE;  Surgeon: Danie Binder, MD;  Location: [facility] ENDO SUITE;  Service: Endoscopy;  Laterality: N/A;  0700  . Esophagogastroduodenoscopy N/A 06/29/2014    SLF: 1. anemia due to polypoid lesions in teh antrum, and duodenal polyps 2. single duodenum AVM  . Givens capsule study N/A 06/29/2014    Procedure: GIVENS CAPSULE STUDY;  Surgeon: Danie Binder, MD;  Location: [facility] SU[facility];  Service: Endoscopy;  Laterality: N/A;  . Givens capsule study N/A 07/13/2014    Procedure: GIVENS CAPSULE STUDY;  Surgeon: Danie Binder, MD;  Location: [facility]  ENDO SUITE;  Service: Endoscopy;  Laterality: N/A;  700  . Tooth extraction Left 12/12/14    OB History    Gravida Para Term Preterm AB TAB SAB Ectopic Multiple Living            0      Social History   Social History  . Marital Status: Married    Spouse Name: N/A  . Number of Children: 0  . Years of Education: N/A   Occupational History  .     Social History Main Topics  . Smoking status: Former Smoker -- 2.00 packs/day for 12 years    Types: Cigarettes    Quit date: 03/31/1995  . Smokeless tobacco: Never Used     Comment: quit in 1997  . Alcohol Use: No     Comment: Bottle of wine a day for years, but quit in 07/2011  . Drug Use: No  . Sexual Activity: Not Currently    Birth Control/ Protection:  Post-menopausal   Other Topics Concern  . None   Social History Narrative   Quit drinking alcohol 3 weeks ago. No drugs, no IVDU.     Family History  Problem Relation Age of Onset  . Cirrhosis Mother     nash  . Colon cancer Neg Hx   . Inflammatory bowel disease Neg Hx   . Asthma    . Heart disease    . COPD Father   . Heart disease Father      Current outpatient prescriptions:  .  acetaminophen (TYLENOL) 500 MG tablet, Take 1,000 mg by mouth every 6 (six) hours as needed for moderate pain., Disp: , Rfl:  .  amitriptyline (ELAVIL) 10 MG tablet, Take 1 tablet (10 mg total) by mouth at bedtime., Disp: 30 tablet, Rfl: 5 .  bumetanide (BUMEX) 1 MG tablet, TAKE (1) TABLET BY MOUTH (2) TIMES DAILY., Disp: 60 tablet, Rfl: 3 .  buPROPion (WELLBUTRIN SR) 100 MG 12 hr tablet, Take 1 tablet (100 mg total) by mouth 2 (two) times daily., Disp: 60 tablet, Rfl: 5 .  carboxymethylcellulose (REFRESH PLUS) 0.5 % SOLN, Place 2 drops into both eyes 3 (three) times daily as needed (Dry Eyes). , Disp: , Rfl:  .  docusate sodium (COLACE) 100 MG capsule, Take 100 mg by mouth daily., Disp: , Rfl:  .  escitalopram (LEXAPRO) 20 MG tablet, TAKE (1) TABLET BY MOUTH AT BEDTIME., Disp: 30 tablet, Rfl: 5 .  ferrous sulfate 300 (60 Fe) MG/5ML syrup, Take 5 mLs (300 mg total) by mouth 2 (two) times daily with a meal. In 4 oz of orange juice, Disp: 150 mL, Rfl: 3 .  loratadine (CLARITIN) 10 MG tablet, Take 10 mg by mouth daily as needed for allergies. , Disp: , Rfl:  .  RaNITidine HCl (ZANTAC PO), Take by mouth., Disp: , Rfl:  .  sodium chloride (OCEAN) 0.65 % nasal spray, Place 1 spray into the nose as needed for congestion., Disp: , Rfl:  .  spironolactone (ALDACTONE) 100 MG tablet, TAKE (1) TABLET BY MOUTH TWICE DAILY., Disp: 60 tablet, Rfl: 5 .  XIFAXAN 550 MG TABS tablet, TAKE (1) TABLET BY MOUTH TWICE DAILY., Disp: 60 tablet, Rfl: 5  Review of Systems  Review of Systems  Constitutional: Negative for  fever, chills, weight loss, malaise/fatigue and diaphoresis.  HENT: Negative for hearing loss, ear pain, nosebleeds, congestion, sore throat, neck pain, tinnitus and ear discharge.   Eyes: Negative for blurred vision, double vision, photophobia, pain,  discharge and redness.  Respiratory: Negative for cough, hemoptysis, sputum production, shortness of breath, wheezing and stridor.   Cardiovascular: Negative for chest pain, palpitations, orthopnea, claudication, leg swelling and PND.  Gastrointestinal: negative for abdominal pain. Negative for heartburn, nausea, vomiting, diarrhea, constipation, blood in stool and melena.  Genitourinary: Negative for dysuria, urgency, frequency, hematuria and flank pain.  Musculoskeletal: Negative for myalgias, back pain, joint pain and falls.  Skin: Negative for itching and rash.  Neurological: Negative for dizziness, tingling, tremors, sensory change, speech change, focal weakness, seizures, loss of consciousness, weakness and headaches.  Endo/Heme/Allergies: Negative for environmental allergies and polydipsia. Does not bruise/bleed easily.  Psychiatric/Behavioral: Negative for depression, suicidal ideas, hallucinations, memory loss and substance abuse. The patient is not nervous/anxious and does not have insomnia.        Objective:  Blood pressure 122/80, pulse 86, height 5\' 8"  (1.727 m), weight 243 lb (110.224 kg).   Physical Exam  Vitals reviewed. Constitutional: She is oriented to person, place, and time. She appears well-developed and well-nourished.  HENT:  Head: Normocephalic and atraumatic.        Right Ear: External ear normal.  Left Ear: External ear normal.  Nose: Nose normal.  Mouth/Throat: Oropharynx is clear and moist.  Eyes: Conjunctivae and EOM are normal. Pupils are equal, round, and reactive to light. Right eye exhibits no discharge. Left eye exhibits no discharge. No scleral icterus.  Neck: Normal range of motion. Neck supple. No  tracheal deviation present. No thyromegaly present.  Cardiovascular: Normal rate, regular rhythm, normal heart sounds and intact distal pulses.  Exam reveals no gallop and no friction rub.   No murmur heard. Respiratory: Effort normal and breath sounds normal. No respiratory distress. She has no wheezes. She has no rales. She exhibits no tenderness.  GI: Soft. Bowel sounds are normal. She exhibits no distension and no mass. There is no tenderness. There is no rebound and no guarding.  Genitourinary:  Breasts no masses skin changes or nipple changes bilaterally      Vulva is normal without lesions Vagina is pink moist without discharge Cervix normal in appearance and pap is done Uterus is normal size shape and contour Adnexa is negative with normal sized ovaries  {Rectal    hemoccult negative, normal tone, no masses  Musculoskeletal: Normal range of motion. She exhibits no edema and no tenderness.  Neurological: She is alert and oriented to person, place, and time. She has normal reflexes. She displays normal reflexes. No cranial nerve deficit. She exhibits normal muscle tone. Coordination normal.  Skin: Skin is warm and dry. No rash noted. No erythema. No pallor.  Psychiatric: She has a normal mood and affect. Her behavior is normal. Judgment and thought content normal.       Assessment:     normal gyn exam .    Plan:    Mammogram ordered. Follow up in: 1 year.    No orders of the defined types were placed in this encounter.    No orders of the defined types were placed in this encounter.

## 2015-06-05 ENCOUNTER — Other Ambulatory Visit (HOSPITAL_COMMUNITY): Payer: BLUE CROSS/BLUE SHIELD

## 2015-06-05 ENCOUNTER — Encounter (HOSPITAL_COMMUNITY): Payer: BLUE CROSS/BLUE SHIELD | Attending: Hematology & Oncology

## 2015-06-05 DIAGNOSIS — D500 Iron deficiency anemia secondary to blood loss (chronic): Secondary | ICD-10-CM | POA: Insufficient documentation

## 2015-06-05 DIAGNOSIS — K7469 Other cirrhosis of liver: Secondary | ICD-10-CM

## 2015-06-05 LAB — CBC WITH DIFFERENTIAL/PLATELET
BASOS ABS: 0.1 10*3/uL (ref 0.0–0.1)
Basophils Relative: 1 %
Eosinophils Absolute: 0.2 10*3/uL (ref 0.0–0.7)
Eosinophils Relative: 3 %
HEMATOCRIT: 44.2 % (ref 36.0–46.0)
HEMOGLOBIN: 15 g/dL (ref 12.0–15.0)
LYMPHS PCT: 19 %
Lymphs Abs: 1.7 10*3/uL (ref 0.7–4.0)
MCH: 31.5 pg (ref 26.0–34.0)
MCHC: 33.9 g/dL (ref 30.0–36.0)
MCV: 92.9 fL (ref 78.0–100.0)
MONO ABS: 0.7 10*3/uL (ref 0.1–1.0)
Monocytes Relative: 8 %
NEUTROS ABS: 6 10*3/uL (ref 1.7–7.7)
Neutrophils Relative %: 69 %
Platelets: 238 10*3/uL (ref 150–400)
RBC: 4.76 MIL/uL (ref 3.87–5.11)
RDW: 14 % (ref 11.5–15.5)
WBC: 8.6 10*3/uL (ref 4.0–10.5)

## 2015-06-05 LAB — SEDIMENTATION RATE: Sed Rate: 64 mm/hr — ABNORMAL HIGH (ref 0–22)

## 2015-06-05 LAB — COMPREHENSIVE METABOLIC PANEL
ALK PHOS: 87 U/L (ref 38–126)
ALT: 19 U/L (ref 14–54)
AST: 25 U/L (ref 15–41)
Albumin: 4.4 g/dL (ref 3.5–5.0)
Anion gap: 10 (ref 5–15)
BILIRUBIN TOTAL: 0.7 mg/dL (ref 0.3–1.2)
BUN: 15 mg/dL (ref 6–20)
CHLORIDE: 103 mmol/L (ref 101–111)
CO2: 24 mmol/L (ref 22–32)
CREATININE: 1.21 mg/dL — AB (ref 0.44–1.00)
Calcium: 9.3 mg/dL (ref 8.9–10.3)
GFR calc Af Amer: 56 mL/min — ABNORMAL LOW (ref >60)
GFR, EST NON AFRICAN AMERICAN: 48 mL/min — AB (ref >60)
Glucose, Bld: 97 mg/dL (ref 65–99)
Potassium: 3.8 mmol/L (ref 3.5–5.1)
Sodium: 137 mmol/L (ref 135–145)
TOTAL PROTEIN: 8.4 g/dL — AB (ref 6.5–8.1)

## 2015-06-05 LAB — FERRITIN: FERRITIN: 75 ng/mL (ref 11–307)

## 2015-06-05 LAB — FOLATE: FOLATE: 10.2 ng/mL (ref >5.9)

## 2015-06-05 LAB — LACTATE DEHYDROGENASE: LDH: 144 U/L (ref 98–192)

## 2015-06-05 LAB — IRON AND TIBC
Iron: 98 ug/dL (ref 28–170)
Saturation Ratios: 24 % (ref 10.4–31.8)
TIBC: 403 ug/dL (ref 250–450)
UIBC: 305 ug/dL

## 2015-06-05 LAB — RETICULOCYTES
RBC.: 4.76 MIL/uL (ref 3.87–5.11)
RETIC COUNT ABSOLUTE: 47.6 10*3/uL (ref 19.0–186.0)
Retic Ct Pct: 1 % (ref 0.4–3.1)

## 2015-06-05 LAB — VITAMIN B12: VITAMIN B 12: 170 pg/mL — AB (ref 180–914)

## 2015-06-06 LAB — HAPTOGLOBIN: Haptoglobin: 282 mg/dL — ABNORMAL HIGH (ref 34–200)

## 2015-06-12 ENCOUNTER — Other Ambulatory Visit (HOSPITAL_COMMUNITY): Payer: Self-pay | Admitting: Hematology & Oncology

## 2015-06-12 ENCOUNTER — Encounter (HOSPITAL_COMMUNITY): Payer: Self-pay | Admitting: Oncology

## 2015-06-12 ENCOUNTER — Other Ambulatory Visit (HOSPITAL_COMMUNITY): Payer: Self-pay | Admitting: Oncology

## 2015-06-12 ENCOUNTER — Other Ambulatory Visit (HOSPITAL_COMMUNITY): Payer: Self-pay | Admitting: Emergency Medicine

## 2015-06-12 DIAGNOSIS — E538 Deficiency of other specified B group vitamins: Secondary | ICD-10-CM

## 2015-06-12 HISTORY — DX: Deficiency of other specified B group vitamins: E53.8

## 2015-06-18 ENCOUNTER — Encounter (HOSPITAL_COMMUNITY): Payer: BLUE CROSS/BLUE SHIELD

## 2015-06-18 ENCOUNTER — Encounter (HOSPITAL_BASED_OUTPATIENT_CLINIC_OR_DEPARTMENT_OTHER): Payer: BLUE CROSS/BLUE SHIELD

## 2015-06-18 ENCOUNTER — Other Ambulatory Visit (HOSPITAL_COMMUNITY): Payer: BLUE CROSS/BLUE SHIELD

## 2015-06-18 ENCOUNTER — Ambulatory Visit (HOSPITAL_COMMUNITY): Payer: BLUE CROSS/BLUE SHIELD

## 2015-06-18 VITALS — BP 103/65 | HR 72 | Temp 97.8°F | Resp 18

## 2015-06-18 DIAGNOSIS — E538 Deficiency of other specified B group vitamins: Secondary | ICD-10-CM | POA: Diagnosis not present

## 2015-06-18 DIAGNOSIS — D500 Iron deficiency anemia secondary to blood loss (chronic): Secondary | ICD-10-CM

## 2015-06-18 DIAGNOSIS — K7469 Other cirrhosis of liver: Secondary | ICD-10-CM | POA: Diagnosis not present

## 2015-06-18 MED ORDER — FERUMOXYTOL INJECTION 510 MG/17 ML
510.0000 mg | Freq: Once | INTRAVENOUS | Status: AC
  Administered 2015-06-18: 510 mg via INTRAVENOUS
  Filled 2015-06-18: qty 17

## 2015-06-18 MED ORDER — CYANOCOBALAMIN 1000 MCG/ML IJ SOLN
1000.0000 ug | Freq: Once | INTRAMUSCULAR | Status: AC
  Administered 2015-06-18: 1000 ug via INTRAMUSCULAR

## 2015-06-18 MED ORDER — CYANOCOBALAMIN 1000 MCG/ML IJ SOLN
INTRAMUSCULAR | Status: AC
  Filled 2015-06-18: qty 1

## 2015-06-18 MED ORDER — SODIUM CHLORIDE 0.9 % IV SOLN
INTRAVENOUS | Status: DC
  Administered 2015-06-18: 14:00:00 via INTRAVENOUS

## 2015-06-18 MED ORDER — SODIUM CHLORIDE 0.9% FLUSH
10.0000 mL | Freq: Once | INTRAVENOUS | Status: AC
  Administered 2015-06-18: 10 mL via INTRAVENOUS

## 2015-06-18 NOTE — Patient Instructions (Signed)
[place] at [facility] Discharge Instructions  RECOMMENDATIONS MADE BY THE CONSULTANT AND ANY TEST RESULTS WILL BE SENT TO YOUR REFERRING PHYSICIAN.  Vitamin B12 1000 mcg injection week 1 of 4 given today as ordered. Feraheme 510 mg iron infusion   Thank you for choosing [place] at [facility] to provide your oncology and hematology care.  To afford each patient quality time with our provider, please arrive at least 15 minutes before your scheduled appointment time.   Beginning January 23rd 2017 lab work for the [facility] will be done in the  Main lab at [facility] on 1st floor. If you have a lab appointment with the [facility] please come in thru the  Main Entrance and check in at the main information desk  You need to re-schedule your appointment should you arrive 10 or more minutes late.  We strive to give you quality time with our providers, and arriving late affects you and other patients whose appointments are after yours.  Also, if you no show three or more times for appointments you may be dismissed from the clinic at the providers discretion.     Again, thank you for choosing [facility].  Our hope is that these requests will decrease the amount of time that you wait before being seen by our physicians.       _____________________________________________________________  Should you have questions after your visit to [facility], please contact our office at [phone] between the hours of 8:30 a.m. and 4:30 p.m.  Voicemails left after 4:30 p.m. will not be returned until the following business day.  For prescription refill requests, have your pharmacy contact our office.         Resources For Cancer Patients and their Caregivers ? American Cancer Society: Can assist with transportation, wigs, general needs, runs Look Good Feel Better.        ([phone] [phone] ? Cancer Care: Provides financial  assistance, online support groups, medication/co-pay assistance.  1-800-813-HOPE 508-118-1322) ? [place] Assists [place] cancer patients and their families through emotional , educational and financial support.  925-289-2362 ? [place] DSS Where to apply for food stamps, Medicaid and utility assistance. 351-604-2719 ? RCATS: Transportation to medical appointments. 231-529-0112 ? Social Security Administration: May apply for disability if have a Stage IV cancer. 731-873-9231 [phone]) [phone] ? [employer], Disability and Transit Services: Assists with nutrition, care and transit needs. 9074809519

## 2015-06-18 NOTE — Progress Notes (Signed)
[place] presents today for injection per MD orders. B12 1000 mcg administered IM in left Upper Arm. Administration without incident. Patient tolerated well.   Tolerated iron infusion well. Ambulatory on discharge home to self.

## 2015-06-19 ENCOUNTER — Other Ambulatory Visit: Payer: Self-pay | Admitting: Nurse Practitioner

## 2015-06-19 LAB — INTRINSIC FACTOR ANTIBODIES: INTRINSIC FACTOR: 1 [AU]/ml (ref 0.0–1.1)

## 2015-06-19 LAB — ANTI-PARIETAL ANTIBODY: PARIETAL CELL ANTIBODY-IGG: 2.1 U (ref 0.0–20.0)

## 2015-06-25 ENCOUNTER — Encounter (HOSPITAL_BASED_OUTPATIENT_CLINIC_OR_DEPARTMENT_OTHER): Payer: BLUE CROSS/BLUE SHIELD

## 2015-06-25 VITALS — BP 128/83 | HR 85 | Temp 98.0°F | Resp 18

## 2015-06-25 DIAGNOSIS — E538 Deficiency of other specified B group vitamins: Secondary | ICD-10-CM

## 2015-06-25 MED ORDER — CYANOCOBALAMIN 1000 MCG/ML IJ SOLN
1000.0000 ug | Freq: Once | INTRAMUSCULAR | Status: AC
  Administered 2015-06-25: 1000 ug via INTRAMUSCULAR

## 2015-06-25 MED ORDER — CYANOCOBALAMIN 1000 MCG/ML IJ SOLN
INTRAMUSCULAR | Status: AC
  Filled 2015-06-25: qty 1

## 2015-06-25 NOTE — Progress Notes (Signed)
Deanna Thompson presents today for injection per MD orders. B12 1000mcg administered IM in right Upper Arm. Administration without incident. Patient tolerated well.  

## 2015-06-25 NOTE — Patient Instructions (Signed)
[place] at [facility] Discharge Instructions  RECOMMENDATIONS MADE BY THE CONSULTANT AND ANY TEST RESULTS WILL BE SENT TO YOUR REFERRING PHYSICIAN.  B12 today.  Please return for 2 more weekly B12 injections as scheduled.    Thank you for choosing [place] at [facility] to provide your oncology and hematology care.  To afford each patient quality time with our provider, please arrive at least 15 minutes before your scheduled appointment time.   Beginning January 23rd 2017 lab work for the [facility] will be done in the  Main lab at [facility] on 1st floor. If you have a lab appointment with the [place] please come in thru the  Main Entrance and check in at the main information desk  You need to re-schedule your appointment should you arrive 10 or more minutes late.  We strive to give you quality time with our providers, and arriving late affects you and other patients whose appointments are after yours.  Also, if you no show three or more times for appointments you may be dismissed from the clinic at the providers discretion.     Again, thank you for choosing [facility].  Our hope is that these requests will decrease the amount of time that you wait before being seen by our physicians.       _____________________________________________________________  Should you have questions after your visit to [facility], please contact our office at [phone] between the hours of 8:30 a.m. and 4:30 p.m.  Voicemails left after 4:30 p.m. will not be returned until the following business day.  For prescription refill requests, have your pharmacy contact our office.         Resources For Cancer Patients and their Caregivers ? American Cancer Society: Can assist with transportation, wigs, general needs, runs Look Good Feel Better.        906-211-5523 ? Cancer Care: Provides financial assistance, online  support groups, medication/co-pay assistance.  1-800-813-HOPE 7706511396) ? [place] Assists [place] cancer patients and their families through emotional , educational and financial support.  604-786-1977 ? [place] DSS Where to apply for food stamps, Medicaid and utility assistance. 334-865-7806 ? RCATS: Transportation to medical appointments. 323-351-8363 ? Social Security Administration: May apply for disability if have a Stage IV cancer. 250-461-5941 9790121847 ? [employer], Disability and Transit Services: Assists with nutrition, care and transit needs. [phone]

## 2015-07-02 ENCOUNTER — Ambulatory Visit (HOSPITAL_COMMUNITY): Payer: BLUE CROSS/BLUE SHIELD

## 2015-07-09 ENCOUNTER — Encounter (HOSPITAL_COMMUNITY): Payer: BLUE CROSS/BLUE SHIELD | Attending: Oncology

## 2015-07-09 ENCOUNTER — Telehealth: Payer: Self-pay | Admitting: Gastroenterology

## 2015-07-09 VITALS — BP 125/81 | HR 84 | Temp 98.7°F | Resp 18

## 2015-07-09 DIAGNOSIS — E538 Deficiency of other specified B group vitamins: Secondary | ICD-10-CM | POA: Diagnosis not present

## 2015-07-09 DIAGNOSIS — K7469 Other cirrhosis of liver: Secondary | ICD-10-CM | POA: Insufficient documentation

## 2015-07-09 DIAGNOSIS — D500 Iron deficiency anemia secondary to blood loss (chronic): Secondary | ICD-10-CM

## 2015-07-09 MED ORDER — CYANOCOBALAMIN 1000 MCG/ML IJ SOLN
1000.0000 ug | Freq: Once | INTRAMUSCULAR | Status: AC
  Administered 2015-07-09: 1000 ug via INTRAMUSCULAR
  Filled 2015-07-09: qty 1

## 2015-07-09 NOTE — Telephone Encounter (Signed)
RECALL FOR LABS AND ULTRASOUND

## 2015-07-09 NOTE — Telephone Encounter (Signed)
Mailed letter °

## 2015-07-11 ENCOUNTER — Telehealth: Payer: Self-pay

## 2015-07-11 NOTE — Telephone Encounter (Signed)
Pt has been following up with Rheumatology and has an Indeterminate Connective Tissue Disease Diagnosis. The doctor wants to prescribe Plaquenil and she would like to know if Dr. Oneida Alar that is safe for her. Please advise!

## 2015-07-11 NOTE — Telephone Encounter (Signed)
Left the complete message on VM for pt.

## 2015-07-11 NOTE — Telephone Encounter (Signed)
PLEASE CALL PT. HER LIVER ENZYMES ARE NORMAL PLAQUENIL IS OK.

## 2015-07-16 ENCOUNTER — Ambulatory Visit (HOSPITAL_COMMUNITY): Payer: BLUE CROSS/BLUE SHIELD

## 2015-07-16 ENCOUNTER — Encounter (HOSPITAL_BASED_OUTPATIENT_CLINIC_OR_DEPARTMENT_OTHER): Payer: BLUE CROSS/BLUE SHIELD

## 2015-07-16 ENCOUNTER — Encounter (HOSPITAL_COMMUNITY): Payer: BLUE CROSS/BLUE SHIELD

## 2015-07-16 VITALS — BP 110/68 | HR 80 | Temp 98.2°F | Resp 18

## 2015-07-16 DIAGNOSIS — E538 Deficiency of other specified B group vitamins: Secondary | ICD-10-CM

## 2015-07-16 DIAGNOSIS — D500 Iron deficiency anemia secondary to blood loss (chronic): Secondary | ICD-10-CM

## 2015-07-16 DIAGNOSIS — K7469 Other cirrhosis of liver: Secondary | ICD-10-CM | POA: Diagnosis present

## 2015-07-16 LAB — COMPREHENSIVE METABOLIC PANEL
ALT: 21 U/L (ref 14–54)
AST: 26 U/L (ref 15–41)
Albumin: 4.2 g/dL (ref 3.5–5.0)
Alkaline Phosphatase: 69 U/L (ref 38–126)
Anion gap: 10 (ref 5–15)
BILIRUBIN TOTAL: 0.5 mg/dL (ref 0.3–1.2)
BUN: 17 mg/dL (ref 6–20)
CO2: 27 mmol/L (ref 22–32)
Calcium: 9.3 mg/dL (ref 8.9–10.3)
Chloride: 104 mmol/L (ref 101–111)
Creatinine, Ser: 1.18 mg/dL — ABNORMAL HIGH (ref 0.44–1.00)
GFR, EST AFRICAN AMERICAN: 58 mL/min — AB (ref >60)
GFR, EST NON AFRICAN AMERICAN: 50 mL/min — AB (ref >60)
Glucose, Bld: 103 mg/dL — ABNORMAL HIGH (ref 65–99)
POTASSIUM: 3.8 mmol/L (ref 3.5–5.1)
Sodium: 141 mmol/L (ref 135–145)
TOTAL PROTEIN: 7.9 g/dL (ref 6.5–8.1)

## 2015-07-16 LAB — IRON AND TIBC
Iron: 115 ug/dL (ref 28–170)
SATURATION RATIOS: 31 % (ref 10.4–31.8)
TIBC: 367 ug/dL (ref 250–450)
UIBC: 252 ug/dL

## 2015-07-16 LAB — FERRITIN: Ferritin: 165 ng/mL (ref 11–307)

## 2015-07-16 MED ORDER — CYANOCOBALAMIN 1000 MCG/ML IJ SOLN
INTRAMUSCULAR | Status: AC
  Filled 2015-07-16: qty 1

## 2015-07-16 MED ORDER — CYANOCOBALAMIN 1000 MCG/ML IJ SOLN
1000.0000 ug | Freq: Once | INTRAMUSCULAR | Status: AC
  Administered 2015-07-16: 1000 ug via INTRAMUSCULAR

## 2015-07-16 NOTE — Progress Notes (Signed)
Deanna Thompson presents today for injection per MD orders. B12 1000mcg administered IM in right Upper Arm. Administration without incident. Patient tolerated well.  

## 2015-07-16 NOTE — Patient Instructions (Signed)
[facility] at [facility] Discharge Instructions  RECOMMENDATIONS MADE BY THE CONSULTANT AND ANY TEST RESULTS WILL BE SENT TO YOUR REFERRING PHYSICIAN.  B12 today.    Thank you for choosing [facility] at [facility] to provide your oncology and hematology care.  To afford each patient quality time with our provider, please arrive at least 15 minutes before your scheduled appointment time.   Beginning January 23rd 2017 lab work for the Cancer Center will be done in the  Main lab at [facility] on 1st floor. If you have a lab appointment with the Cancer Center please come in thru the  Main Entrance and check in at the main information desk  You need to re-schedule your appointment should you arrive 10 or more minutes late.  We strive to give you quality time with our providers, and arriving late affects you and other patients whose appointments are after yours.  Also, if you no show three or more times for appointments you may be dismissed from the clinic at the providers discretion.     Again, thank you for choosing [facility].  Our hope is that these requests will decrease the amount of time that you wait before being seen by our physicians.       _____________________________________________________________  Should you have questions after your visit to [facility], please contact our office at [phone] between the hours of 8:30 a.m. and 4:30 p.m.  Voicemails left after 4:30 p.m. will not be returned until the following business day.  For prescription refill requests, have your pharmacy contact our office.         Resources For Cancer Patients and their Caregivers ? American Cancer Society: Can assist with transportation, wigs, general needs, runs Look Good Feel Better.        1-888-227-6333 ? Cancer Care: Provides financial assistance, online support groups, medication/co-pay assistance.  1-800-813-HOPE  (4673) ? [facility] Assists [place] cancer patients and their families through emotional , educational and financial support.  336-427-4357 ? [place] DSS Where to apply for food stamps, Medicaid and utility assistance. 336-342-1394 ? RCATS: Transportation to medical appointments. 336-347-2287 ? Social Security Administration: May apply for disability if have a Stage IV cancer. 336-342-7796 1-800-772-1213 ? [place] Aging, Disability and Transit Services: Assists with nutrition, care and transit needs. [phone]  

## 2015-07-24 ENCOUNTER — Other Ambulatory Visit: Payer: Self-pay

## 2015-07-24 DIAGNOSIS — K7460 Unspecified cirrhosis of liver: Secondary | ICD-10-CM

## 2015-08-06 LAB — CBC WITH DIFFERENTIAL/PLATELET
Basophils Absolute: 80 cells/uL (ref 0–200)
Basophils Relative: 1 %
EOS ABS: 240 {cells}/uL (ref 15–500)
Eosinophils Relative: 3 %
HCT: 45 % (ref 35.0–45.0)
Hemoglobin: 15.1 g/dL (ref 11.7–15.5)
LYMPHS PCT: 22 %
Lymphs Abs: 1760 cells/uL (ref 850–3900)
MCH: 30.8 pg (ref 27.0–33.0)
MCHC: 33.6 g/dL (ref 32.0–36.0)
MCV: 91.6 fL (ref 80.0–100.0)
MONOS PCT: 6 %
MPV: 10.6 fL (ref 7.5–12.5)
Monocytes Absolute: 480 cells/uL (ref 200–950)
NEUTROS ABS: 5440 {cells}/uL (ref 1500–7800)
Neutrophils Relative %: 68 %
PLATELETS: 251 10*3/uL (ref 140–400)
RBC: 4.91 MIL/uL (ref 3.80–5.10)
RDW: 14 % (ref 11.0–15.0)
WBC: 8 10*3/uL (ref 3.8–10.8)

## 2015-08-06 LAB — COMPREHENSIVE METABOLIC PANEL
ALK PHOS: 80 U/L (ref 33–130)
ALT: 15 U/L (ref 6–29)
AST: 22 U/L (ref 10–35)
Albumin: 4.6 g/dL (ref 3.6–5.1)
BILIRUBIN TOTAL: 0.5 mg/dL (ref 0.2–1.2)
BUN: 17 mg/dL (ref 7–25)
CO2: 29 mmol/L (ref 20–31)
CREATININE: 1.33 mg/dL — AB (ref 0.50–1.05)
Calcium: 9.8 mg/dL (ref 8.6–10.4)
Chloride: 98 mmol/L (ref 98–110)
GLUCOSE: 108 mg/dL — AB (ref 65–99)
POTASSIUM: 4 mmol/L (ref 3.5–5.3)
Sodium: 139 mmol/L (ref 135–146)
TOTAL PROTEIN: 7.6 g/dL (ref 6.1–8.1)

## 2015-08-06 LAB — PROTIME-INR
INR: 1.22 (ref <1.50)
PROTHROMBIN TIME: 15.6 s — AB (ref 11.6–15.2)

## 2015-08-06 LAB — AFP TUMOR MARKER: AFP-Tumor Marker: 4.7 ng/mL (ref <6.1)

## 2015-08-07 ENCOUNTER — Encounter: Payer: Self-pay | Admitting: Gastroenterology

## 2015-08-14 ENCOUNTER — Ambulatory Visit (HOSPITAL_COMMUNITY): Payer: BLUE CROSS/BLUE SHIELD | Admitting: Oncology

## 2015-08-28 ENCOUNTER — Encounter (HOSPITAL_COMMUNITY): Payer: BLUE CROSS/BLUE SHIELD

## 2015-08-28 ENCOUNTER — Ambulatory Visit (HOSPITAL_COMMUNITY): Payer: BLUE CROSS/BLUE SHIELD | Admitting: Oncology

## 2015-08-28 ENCOUNTER — Encounter (HOSPITAL_COMMUNITY): Payer: Self-pay | Admitting: Oncology

## 2015-08-28 ENCOUNTER — Encounter (HOSPITAL_COMMUNITY): Payer: BLUE CROSS/BLUE SHIELD | Attending: Oncology | Admitting: Oncology

## 2015-08-28 VITALS — BP 119/68 | HR 82 | Temp 98.0°F | Resp 20 | Wt 246.0 lb

## 2015-08-28 DIAGNOSIS — D500 Iron deficiency anemia secondary to blood loss (chronic): Secondary | ICD-10-CM | POA: Insufficient documentation

## 2015-08-28 DIAGNOSIS — E538 Deficiency of other specified B group vitamins: Secondary | ICD-10-CM

## 2015-08-28 DIAGNOSIS — K7469 Other cirrhosis of liver: Secondary | ICD-10-CM | POA: Insufficient documentation

## 2015-08-28 LAB — COMPREHENSIVE METABOLIC PANEL
ALBUMIN: 4.4 g/dL (ref 3.5–5.0)
ALT: 20 U/L (ref 14–54)
ANION GAP: 9 (ref 5–15)
AST: 25 U/L (ref 15–41)
Alkaline Phosphatase: 75 U/L (ref 38–126)
BILIRUBIN TOTAL: 0.3 mg/dL (ref 0.3–1.2)
BUN: 20 mg/dL (ref 6–20)
CALCIUM: 9.6 mg/dL (ref 8.9–10.3)
CO2: 25 mmol/L (ref 22–32)
Chloride: 104 mmol/L (ref 101–111)
Creatinine, Ser: 1.22 mg/dL — ABNORMAL HIGH (ref 0.44–1.00)
GFR, EST AFRICAN AMERICAN: 55 mL/min — AB (ref >60)
GFR, EST NON AFRICAN AMERICAN: 48 mL/min — AB (ref >60)
Glucose, Bld: 116 mg/dL — ABNORMAL HIGH (ref 65–99)
POTASSIUM: 3.7 mmol/L (ref 3.5–5.1)
Sodium: 138 mmol/L (ref 135–145)
TOTAL PROTEIN: 8.3 g/dL — AB (ref 6.5–8.1)

## 2015-08-28 LAB — CBC WITH DIFFERENTIAL/PLATELET
BASOS PCT: 1 %
Basophils Absolute: 0.1 10*3/uL (ref 0.0–0.1)
Eosinophils Absolute: 0.2 10*3/uL (ref 0.0–0.7)
Eosinophils Relative: 3 %
HEMATOCRIT: 43.8 % (ref 36.0–46.0)
Hemoglobin: 14.8 g/dL (ref 12.0–15.0)
LYMPHS ABS: 1.4 10*3/uL (ref 0.7–4.0)
Lymphocytes Relative: 18 %
MCH: 31.3 pg (ref 26.0–34.0)
MCHC: 33.8 g/dL (ref 30.0–36.0)
MCV: 92.6 fL (ref 78.0–100.0)
MONO ABS: 0.5 10*3/uL (ref 0.1–1.0)
MONOS PCT: 6 %
NEUTROS ABS: 5.6 10*3/uL (ref 1.7–7.7)
Neutrophils Relative %: 72 %
Platelets: 224 10*3/uL (ref 150–400)
RBC: 4.73 MIL/uL (ref 3.87–5.11)
RDW: 13.6 % (ref 11.5–15.5)
WBC: 7.8 10*3/uL (ref 4.0–10.5)

## 2015-08-28 LAB — IRON AND TIBC
IRON: 97 ug/dL (ref 28–170)
Saturation Ratios: 25 % (ref 10.4–31.8)
TIBC: 388 ug/dL (ref 250–450)
UIBC: 291 ug/dL

## 2015-08-28 LAB — VITAMIN B12: Vitamin B-12: 289 pg/mL (ref 180–914)

## 2015-08-28 LAB — FERRITIN: Ferritin: 176 ng/mL (ref 11–307)

## 2015-08-28 MED ORDER — CYANOCOBALAMIN 1000 MCG/ML IJ SOLN: 1000.0000 ug | Freq: Once | INTRAMUSCULAR | Status: DC

## 2015-08-28 MED ORDER — POLYSACCHAR IRON-FA-B12 150-1-25 MG-MG-MCG PO CAPS: 1.0000 | ORAL_CAPSULE | Freq: Every day | ORAL | Status: DC

## 2015-08-28 NOTE — Assessment & Plan Note (Addendum)
Iron deficiency anemia secondary to chronic GI blood loss in the setting of cirrhosis of the liver with Camera Capsule study demonstrating distal jejunal and proximal ileum AVMs and EGD on 06/29/2014 with a polypoid lesion in the duodenum and a single duodenal AVM.  Colonoscopy on 01/17/2013 with rectal varices, polyps, and hemorrhoids.  Due to cost associated with IV replacement, she is intolerant to PO and liq ferrous sulfate (nausea, vomiting, abdominal pain, and constipation).  Oncology Flowsheet 06/18/2015  ferumoxytol [facility]) IV 510 mg   Labs today: CBC diff, CMET, iron/TIBC, ferritin.  Labs every 6 weeks: CBC diff, CMET, iron/TIBC, ferritin.  Given her intolerance to PO and liq ferrous sulfate, and IV iron causing a financial hardship, I have recommended a trial of ferrex-forte.  She is agreeable to this as it is typically better tolerated.  She knows that she still may need IV iron replacement moving forward based upon her iron needs; but if tolerated, ferrex forte may decrease her IV iron need and therefore save her some money.  Return in 4 months for follow-up

## 2015-08-28 NOTE — Patient Instructions (Addendum)
[place] at [facility] Discharge Instructions  RECOMMENDATIONS MADE BY THE CONSULTANT AND ANY TEST RESULTS WILL BE SENT TO YOUR REFERRING PHYSICIAN.   Labs every 6 weeks  Return in 4 months for f/u with doctor  Raeanne Gathers and b12 injections to be given @ home -- both called in to  Pharmacy.    Thank you for choosing [place] at [facility] to provide your oncology and hematology care.  To afford each patient quality time with our provider, please arrive at least 15 minutes before your scheduled appointment time.   Beginning January 23rd 2017 lab work for the [facility] will be done in the  Main lab at [facility] on 1st floor. If you have a lab appointment with the [facility] please come in thru the  Main Entrance and check in at the main information desk  You need to re-schedule your appointment should you arrive 10 or more minutes late.  We strive to give you quality time with our providers, and arriving late affects you and other patients whose appointments are after yours.  Also, if you no show three or more times for appointments you may be dismissed from the clinic at the providers discretion.     Again, thank you for choosing [facility].  Our hope is that these requests will decrease the amount of time that you wait before being seen by our physicians.       _____________________________________________________________  Should you have questions after your visit to [facility], please contact our office at [phone] between the hours of 8:30 a.m. and 4:30 p.m.  Voicemails left after 4:30 p.m. will not be returned until the following business day.  For prescription refill requests, have your pharmacy contact our office.         Resources For Cancer Patients and their Caregivers ? American Cancer Society: Can assist with transportation, wigs, general needs, runs Look Good Feel Better.         318-257-7538 ? Cancer Care: Provides financial assistance, online support groups, medication/co-pay assistance.  1-800-813-HOPE (405) 883-6119) ? [place] Assists [place] cancer patients and their families through emotional , educational and financial support.  [phone] ? [place] DSS Where to apply for food stamps, Medicaid and utility assistance. (986)061-8538 ? RCATS: Transportation to medical appointments. 303-694-2084 ? Social Security Administration: May apply for disability if have a Stage IV cancer. 907-049-2462 575-473-4774 ? [employer], Disability and Transit Services: Assists with nutrition, care and transit needs. [place] Support Programs: @10RELATIVEDAYS @ > Cancer Support Group  2nd Tuesday of the month 1pm-2pm, Journey Room  > Creative Journey  3rd Tuesday of the month 1130am-1pm, Journey Room  > Look Good Feel Better  1st Wednesday of the month 10am-12 noon, Journey Room (Call [place] to register [phone])

## 2015-08-28 NOTE — Assessment & Plan Note (Addendum)
B12 deficiency with negative intrinsic factor and anti-parietal cell antibody testing on 06/18/2015.  She is S/P 4 weeks B12 injections finishing on 07/16/2015.  Oncology Flowsheet 06/18/2015 06/25/2015 07/09/2015  cyanocobalamin ((VITAMIN B-12)) IM 1,000 mcg 1,000 mcg 1,000 mcg   Oncology Flowsheet 07/16/2015  cyanocobalamin ((VITAMIN B-12)) IM 1,000 mcg   Labs today: B12, MMA, and homocysteine.  We discussed treatment options including PO versus IM management.  I have recommended IM over PO as she was severely B12 deficient.  She is concerned about cost of monthly injections.  She seems very reliable (this is my first time meeting her).  We discussed IM options and she notes that she can self-administer her injections.  I have escribed B12 1000 mcg injections for the patient.  She will start tomorrow, 08/29/2015.

## 2015-08-28 NOTE — Progress Notes (Signed)
[name], MD [name] [id number]  Iron deficiency anemia due to chronic blood loss - Plan: Polysacchar Iron-FA-B12 (FERREX 150 FORTE) 150-1-25 MG-MG-MCG CAPS  B12 deficiency - Plan: Vitamin B12, Methylmalonic acid, serum, Homocysteine, serum, cyanocobalamin (,VITAMIN B-12,) 1000 MCG/ML injection  CURRENT THERAPY: To begin Ferrex forte PO, continued IV iron replacement when indicate.  S/P weekly B12 x 4 (last given on 07/16/2015) and to begin monthly B12 1000 mcg IM at home on 08/29/2015.  INTERVAL HISTORY: Deanna Thompson 60 y.o. female returns for followup of iron deficiency anemia secondary to chronic GI blood loss in the setting of cirrhosis of the liver with Camera Capsule study demonstrating distal jejunal and proximal ileum AVMs and EGD on 06/29/2014 with a polypoid lesion in the duodenum and a single duodenal AVM.  Colonoscopy on 01/17/2013 with rectal varices, polyps, and hemorrhoids.  Due to cost associated with IV replacement, she is taking PO iron in liquid formulation due to difficulty with swallowing iron pills. AND B12 deficiency, negative intrinsic factor and anti-parietal cell antibody testing on 06/18/2015.  S/P for weekly B12 injections of 1000 g (06/18/2015-07/16/2015).  I personally reviewed and went over laboratory results with the patient.  The results are noted within this dictation.  Labs are updated today.  I reviewed her B-12 deficiency workup including intrinsic factor and antiparietal cell antibody testing. Both of which were negative. She has been replaced with weekly B12 injections 4. We discussed her options moving forward including:  PO B12 replacement  SQ/IM B12 replacement I have recommended SQ/IM replacement given her severe deficiency at time of diagnosis.  She is concerned about cost as I am sure there is an administration fee.  She notes that she used to give herself injections of another medication in the past. She is  willing to self-administer B12.  She has been seeing Rheumatology for work-up of connective tissue disorder and she notes that she does not have SLE, however, she has symptoms of 3 autoimmune disorders.  I do not have records available to review at this time regarding this issue(s). She calls it "undifferentiated connective tissue disease."  Review of Systems  HENT: Negative.   Eyes: Negative.   Respiratory: Negative.   Cardiovascular: Negative.   Gastrointestinal: Positive for nausea (with Liq ferrous sulfate, resolved with discontinuation of medication.), abdominal pain (with Liq ferrous sulfate, resolved with discontinuation of medication) and constipation (with Liq ferrous sulfate, resolved with discontinuation of medication). Negative for diarrhea, blood in stool and melena.  Genitourinary: Negative.  Negative for hematuria.  Musculoskeletal: Positive for joint pain (hip and right ankle). Negative for falls.  Skin: Negative.   Neurological: Negative.   Endo/Heme/Allergies: Negative.   Psychiatric/Behavioral: Negative.     Past Medical History  Diagnosis Date  . IBS (irritable bowel syndrome)   . GERD (gastroesophageal reflux disease)   . Panic attack   . Anxiety   . Psoriatic arthritis ([place])   . ETOH abuse     Quit in 07/2011   . Sjogren's disease ([place])   . Cirrhosis ([place])     Presumably alcoholic cirrhosis  . Complication of anesthesia   . PONV (postoperative nausea and vomiting)   . Abdominal hernia   . 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 Seizures ([place]) 1997    1 seizure from coil insertion but no more seizures and no meds for seizures  . HTN (hypertension)  no longer medicated, dated on 04/25/12  . Fatigue     due to Sjogren's disease  . Arthritis     back  and down flank  right side more than left  . Right inguinal hernia 08/16/2012  . [place] HEAD 02/03/2007    Qualifier: Diagnosis of  By: Aline Brochure MD, Dorothyann Peng    . Abscess  of pulp of tooth 12/12/14    left side  . Depression   . B12 deficiency 06/12/2015  . Iron deficiency anemia due to chronic blood loss   . Myofascial pain   . 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 Biopsy  10/13/2011    SLF: mild gastritis/duodenal polypoid lesion   . Colonoscopy  10/13/2011    [id number] hemorrhoids/varices rectal/lesion in ascending colon(HYPERPLASTIC POLYP)  . Paracentesis  Feb 2014    10.7 liters  . Colonoscopy N/A 01/17/2013    SLF: 1. Internal hemorrhoids 2. Ascending colon polyps 3.  Rectal varices.   . Cerebral aneurysm repair  1997  . Umbilical hernia repair N/A 06/09/2013    Procedure: UMBILICAL HERNIORRHAPHY WITH MESH;  Surgeon: Jamesetta So, MD;  Location: [facility];  Service: General;  Laterality: N/A;  . Insertion of mesh N/A 06/09/2013    Procedure: INSERTION OF MESH;  Surgeon: Jamesetta So, MD;  Location: [facility];  Service: General;  Laterality: N/A;  . Inguinal hernia repair Right 08/28/2013    Procedure: RIGHT NGUINAL HERNIORRHAPHY WITH MESH;  Surgeon: Jamesetta So, MD;  Location: [facility];  Service: General;  Laterality: Right;  . Insertion of mesh Right 08/28/2013    Procedure: INSERTION OF MESH;  Surgeon: Jamesetta So, MD;  Location: [facility];  Service: General;  Laterality: Right;  . Agile capsule N/A 06/21/2014    Procedure: AGILE CAPSULE;  Surgeon: Danie Binder, MD;  Location: [facility];  Service: Endoscopy;  Laterality: N/A;  0700  . Esophagogastroduodenoscopy N/A 06/29/2014    SLF: 1. anemia due to polypoid lesions in teh antrum, and duodenal polyps 2. single duodenum AVM  . Givens capsule study N/A 06/29/2014    Procedure: GIVENS CAPSULE STUDY;  Surgeon: Danie Binder, MD;  [facility];  Service: Endoscopy;  Laterality: N/A;  . Givens capsule study N/A 07/13/2014    Procedure: GIVENS CAPSULE STUDY;  Surgeon: Danie Binder, MD;  Location: [facility];  Service:  Endoscopy;  Laterality: N/A;  700  . Tooth extraction Left 12/12/14    Family History  Problem Relation Age of Onset  . Cirrhosis Mother     nash  . Colon cancer Neg Hx   . Inflammatory bowel disease Neg Hx   . Asthma    . Heart disease    . COPD Father   . Heart disease Father     Social History   Social History  . Marital Status: Married    Spouse Name: N/A  . Number of Children: 0  . Years of Education: N/A   Occupational History  .     Social History Main Topics  . Smoking status: Former Smoker -- 2.00 packs/day for 12 years    Types: Cigarettes    Quit date: 03/31/1995  . Smokeless tobacco: Never Used     Comment: quit in 1997  . Alcohol Use: No     Comment: Bottle of wine a day for years, but quit in 07/2011  . Drug Use: No  .  Sexual Activity: Not Currently    Birth Control/ Protection: Post-menopausal   Other Topics Concern  . None   Social History Narrative   Quit drinking alcohol 3 weeks ago. No drugs, no IVDU.      PHYSICAL EXAMINATION  ECOG PERFORMANCE STATUS: 0 - Asymptomatic  Filed Vitals:   08/28/15 0957  BP: 119/68  Pulse: 82  Temp: 98 F (36.7 C)  Resp: 20    GENERAL:alert, no distress, well nourished, well developed, comfortable, cooperative, obese, smiling and unaccompanied SKIN: skin color, texture, turgor are normal, no rashes or significant lesions HEAD: Normocephalic, No masses, lesions, tenderness or abnormalities EYES: normal, EOMI, Conjunctiva are pink and non-injected EARS: External ears normal OROPHARYNX:lips, buccal mucosa, and tongue normal  NECK: supple, trachea midline LYMPH:  not examined BREAST:not examined LUNGS: clear to auscultation  HEART: regular rate & rhythm ABDOMEN:abdomen soft and obese BACK: Back symmetric, no curvature. EXTREMITIES:less then 2 second capillary refill, no skin discoloration, no cyanosis  NEURO: alert & oriented x 3 with fluent speech, no focal motor/sensory deficits, gait  normal   LABORATORY DATA: CBC    Component Value Date/Time   WBC 7.8 08/28/2015 0934   RBC 4.73 08/28/2015 0934   RBC 4.76 06/05/2015 1216   HGB 14.8 08/28/2015 0934   HCT 43.8 08/28/2015 0934   HCT 41 07/30/2011 0743   PLT 224 08/28/2015 0934   MCV 92.6 08/28/2015 0934   MCV 104.3 07/30/2011 0743   MCH 31.3 08/28/2015 0934   MCHC 33.8 08/28/2015 0934   RDW 13.6 08/28/2015 0934   LYMPHSABS 1.4 08/28/2015 0934   MONOABS 0.5 08/28/2015 0934   EOSABS 0.2 08/28/2015 0934   BASOSABS 0.1 08/28/2015 0934      Chemistry      Component Value Date/Time   NA 138 08/28/2015 0934   NA 138 07/30/2011 0741   K 3.7 08/28/2015 0934   K 3.8 07/30/2011 0741   CL 104 08/28/2015 0934   CO2 25 08/28/2015 0934   BUN 20 08/28/2015 0934   BUN 4 07/30/2011 0741   CREATININE 1.22* 08/28/2015 0934   CREATININE 1.33* 08/05/2015 1310      Component Value Date/Time   CALCIUM 9.6 08/28/2015 0934   ALKPHOS 75 08/28/2015 0934   ALKPHOS 111 07/30/2011 0741   AST 25 08/28/2015 0934   AST 171 07/30/2011 0741   ALT 20 08/28/2015 0934   BILITOT 0.3 08/28/2015 0934   BILITOT 3.6 07/30/2011 0741      Lab Results  Component Value Date   IRON 115 07/16/2015   TIBC 367 07/16/2015   FERRITIN 165 07/16/2015   Lab Results  Component Value Date   VITAMINB12 170* 06/05/2015    PENDING LABS:   RADIOGRAPHIC STUDIES:  No results found.   PATHOLOGY:    ASSESSMENT AND PLAN:  Iron deficiency anemia due to chronic blood loss Iron deficiency anemia secondary to chronic GI blood loss in the setting of cirrhosis of the liver with Camera Capsule study demonstrating distal jejunal and proximal ileum AVMs and EGD on 06/29/2014 with a polypoid lesion in the duodenum and a single duodenal AVM.  Colonoscopy on 01/17/2013 with rectal varices, polyps, and hemorrhoids.  Due to cost associated with IV replacement, she is intolerant to PO and liq ferrous sulfate (nausea, vomiting, abdominal pain, and  constipation).  Oncology Flowsheet 06/18/2015  ferumoxytol [facility]) IV 510 mg   Labs today: CBC diff, CMET, iron/TIBC, ferritin.  Labs every 6 weeks: CBC diff, CMET, iron/TIBC, ferritin.  Given  her intolerance to PO and liq ferrous sulfate, and IV iron causing a financial hardship, I have recommended a trial of ferrex-forte.  She is agreeable to this as it is typically better tolerated.  She knows that she still may need IV iron replacement moving forward based upon her iron needs; but if tolerated, ferrex forte may decrease her IV iron need and therefore save her some money.  Return in 4 months for follow-up   B12 deficiency B12 deficiency with negative intrinsic factor and anti-parietal cell antibody testing on 06/18/2015.  She is S/P 4 weeks B12 injections finishing on 07/16/2015.  Oncology Flowsheet 06/18/2015 06/25/2015 07/09/2015  cyanocobalamin ((VITAMIN B-12)) IM 1,000 mcg 1,000 mcg 1,000 mcg   Oncology Flowsheet 07/16/2015  cyanocobalamin ((VITAMIN B-12)) IM 1,000 mcg   Labs today: B12, MMA, and homocysteine.  We discussed treatment options including PO versus IM management.  I have recommended IM over PO as she was severely B12 deficient.  She is concerned about cost of monthly injections.  She seems very reliable (this is my first time meeting her).  We discussed IM options and she notes that she can self-administer her injections.  I have escribed B12 1000 mcg injections for the patient.  She will start tomorrow, 08/29/2015.    ORDERS PLACED FOR THIS ENCOUNTER: Orders Placed This Encounter  Procedures  . Vitamin B12  . Methylmalonic acid, serum  . Homocysteine, serum    MEDICATIONS PRESCRIBED THIS ENCOUNTER: Meds ordered this encounter  Medications  . Polysacchar Iron-FA-B12 (FERREX 150 FORTE) 150-1-25 MG-MG-MCG CAPS    Sig: Take 1 capsule by mouth daily.    Dispense:  30 capsule    Refill:  5    Order Specific Question:  Supervising Provider    Answer:  Patrici Ranks [id number]  . cyanocobalamin (,VITAMIN B-12,) 1000 MCG/ML injection    Sig: Inject 1 mL (1,000 mcg total) into the muscle once.    Dispense:  1 mL    Refill:  5    Order Specific Question:  Supervising Provider    Answer:  Patrici Ranks [id number]    THERAPY PLAN:  Due to intolerance of ferrous sulfate replacement, we will try ferrex forte PO daily.  We will continue with IV iron replacement as indicated, but she notes that this is costly for her.  Likewise, she is concerned about cost of B12 replacement, so she will start self-administering B12 IM 1000 mcg monthly, at the beginning of each month.  All questions were answered. The patient knows to call the clinic with any problems, questions or concerns. We can certainly see the patient much sooner if necessary.  Patient and plan discussed with Dr. Ancil Linsey and she is in agreement with the aforementioned.   This note is electronically signed by: Doy Mince 08/28/2015 12:10 PM

## 2015-08-29 ENCOUNTER — Encounter: Payer: Self-pay | Admitting: Gastroenterology

## 2015-08-29 ENCOUNTER — Ambulatory Visit (INDEPENDENT_AMBULATORY_CARE_PROVIDER_SITE_OTHER): Payer: BLUE CROSS/BLUE SHIELD | Admitting: Gastroenterology

## 2015-08-29 VITALS — BP 118/78 | HR 85 | Temp 97.0°F | Ht 69.0 in | Wt 242.6 lb

## 2015-08-29 DIAGNOSIS — K7030 Alcoholic cirrhosis of liver without ascites: Secondary | ICD-10-CM

## 2015-08-29 LAB — HOMOCYSTEINE: Homocysteine: 22.4 umol/L — ABNORMAL HIGH (ref 0.0–15.0)

## 2015-08-29 NOTE — Assessment & Plan Note (Addendum)
WELL COMPENSATED DISEASE AND NORMAL ALBUMIN, PLT CT, AND NO VARICES ON CT OR EGD. REVIEWED EMR FROM 2015 TO PRESENT.  REVIEWED LABS FROM MAY 2017. STOP ALDACTONE AND BUMEX. CALL WITH QUESTIONS OR CONCERNS RE: FLUID RETENTION. STOP XIFAXAN. CALL WITH QUESTIONS OR CONCERNS RE: CONFUSION/WORSENING FATIGUE. COMPLETE ULTRASOUND. NEXT LABS & U/S IN NOV 2017. NEXT OPV IN DEC 2017. NEXT EGD IN 2019.

## 2015-08-29 NOTE — Progress Notes (Signed)
ON RECALL  °

## 2015-08-29 NOTE — Progress Notes (Signed)
CC'ED TO PCP 

## 2015-08-29 NOTE — Patient Instructions (Signed)
STOP ALDACTONE AND BUMEX. CALL WITH QUESTIONS OR CONCERNS RE: FLUID RETENTION.  STOP XIFAXAN. CALL WITH QUESTIONS OR CONCERNS RE: CONFUSION/WORSENING FATIGUE.  COMPLETE ULTRASOUND.  NEXT LABS & U/S IN NOV 2017.  NEXT OPV IN DEC 2017.

## 2015-08-29 NOTE — Progress Notes (Signed)
Subjective:    Patient ID: Deanna Thompson, female    DOB: 11-Sep-1955, 60 y.o.   MRN: [id number]  [name], MD   HPI WANTS TO KNOW ABOUT LABS & LIFE EXPECTANCY. PLAQUENIL FOR COMBO RHEUMATOID DISEASE: SJOGREN'S. LUPUS, PSORIATIC ARTHRITIS. FEELS TIRED ALL THE TIME. SHE SLEEPS AT NIGHT.   OCCASIONAL SWALLOWING PROBLEMS DUE TO BRAIN SURGERY. NAUSEA WHEN SHE HAS CONSTIPATION, HEARTBURN TREATED WITH RANITIDINE. HAVING HAs. NO SWELLING IN LEGS OR ABDOMEN. MAY HAVE FAIRLY INTENSE PAIN IN RLQ AND GOES AWAY IN 1 MIN. PT DENIES FEVER, CHILLS, HEMATOCHEZIA, vomiting, melena, CHEST PAIN, SHORTNESS OF BREATH,  OR CHANGE IN BOWEL IN HABITS.  Past Medical History  Diagnosis Date  . IBS (irritable bowel syndrome)   . GERD (gastroesophageal reflux disease)   . Panic attack   . Anxiety   . Psoriatic arthritis ([place])   . ETOH abuse     Quit in 07/2011   . Sjogren's disease ([place])   . Cirrhosis ([place])     Presumably alcoholic cirrhosis  . Complication of anesthesia   . PONV (postoperative nausea and vomiting)   . Abdominal hernia   . 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 Seizures ([place]) 1997    1 seizure from coil insertion but no more seizures and no meds for seizures  . HTN (hypertension)     no longer medicated, dated on 04/25/12  . Fatigue     due to Sjogren's disease  . Arthritis     back  and down flank  right side more than left  . Right inguinal hernia 08/16/2012  . [place] HEAD 02/03/2007    Qualifier: Diagnosis of  By: Aline Brochure MD, Dorothyann Peng    . Abscess of pulp of tooth 12/12/14    left side  . Depression   . B12 deficiency 06/12/2015  . Iron deficiency anemia due to chronic blood loss   . Myofascial pain   . 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[facility])     Past Surgical History  Procedure Laterality Date  . Biopsy  10/13/2011    SLF: mild gastritis/duodenal polypoid lesion   . Colonoscopy  10/13/2011    [id number] hemorrhoids/varices  rectal/lesion in ascending colon(HYPERPLASTIC POLYP)  . Paracentesis  Feb 2014    10.7 liters  . Colonoscopy N/A 01/17/2013    SLF: 1. Internal hemorrhoids 2. Ascending colon polyps 3.  Rectal varices.   . Cerebral aneurysm repair  1997  . Umbilical hernia repair N/A 06/09/2013    Procedure: UMBILICAL HERNIORRHAPHY WITH MESH;  Surgeon: Jamesetta So, MD;  Location: [facility];  Service: General;  Laterality: N/A;  . Insertion of mesh N/A 06/09/2013    Procedure: INSERTION OF MESH;  Surgeon: Jamesetta So, MD;  Location: [facility];  Service: General;  Laterality: N/A;  . Inguinal hernia repair Right 08/28/2013    Procedure: RIGHT NGUINAL HERNIORRHAPHY WITH MESH;  Surgeon: Jamesetta So, MD;  Location: [facility];  Service: General;  Laterality: Right;  . Insertion of mesh Right 08/28/2013    Procedure: INSERTION OF MESH;  Surgeon: Jamesetta So, MD;  Location: [facility];  Service: General;  Laterality: Right;  . Agile capsule N/A 06/21/2014    Procedure: AGILE CAPSULE;  Surgeon: Danie Binder, MD;  [facility]: [facility];  Service: Endoscopy;  Laterality: N/A;  0700  . Esophagogastroduodenoscopy N/A 06/29/2014    SLF: 1. anemia due to polypoid lesions in teh antrum, and duodenal polyps  2. single duodenum AVM  . Givens capsule study N/A 06/29/2014    Procedure: GIVENS CAPSULE STUDY;  Surgeon: Danie Binder, MD;  [facility]: [facility];  Service: Endoscopy;  Laterality: N/A;  . Givens capsule study N/A 07/13/2014    Procedure: GIVENS CAPSULE STUDY;  Surgeon: Danie Binder, MD;  [facility];  Service: Endoscopy;  Laterality: N/A;  700  . Tooth extraction Left 12/12/14    Allergies  Allergen Reactions  . Omeprazole Shortness Of Breath  . Other Shortness Of Breath and Rash    Red Meat Causes shortness of breath, rash and flu like symptoms.   . Phenytoin Rash  . Keflex [Cephalexin] Other (See Comments)    Severe yeast infection  . Phenergan [Promethazine Hcl] Other (See Comments)    Not in right  state of mind, HALLUCINATION   Current Outpatient Prescriptions  Medication Sig Dispense Refill  . acetaminophen (TYLENOL) 500 MG tablet Take 1,000 mg by mouth every 6 (six) hours as needed for moderate pain.    Marland Kitchen amitriptyline (ELAVIL) 10 MG tablet Take 1 tablet (10 mg total) by mouth at bedtime.    . bumetanide (BUMEX) 1 MG tablet TAKE (1) TABLET BY MOUTH (2) TIMES DAILY.    Marland Kitchen buPROPion (WELLBUTRIN SR) 100 MG 12 hr tablet Take 1 tablet (100 mg total) by mouth 2 (two) times daily.    . carboxymethylcellulose (REFRESH PLUS) 0.5 % SOLN Place 2 drops into both eyes 3 (three) times daily as needed (Dry Eyes).     . cyanocobalamin (,VITAMIN B-12,) 1000 MCG/ML injection Inject 1 mL (1,000 mcg total) into the muscle once.    . docusate sodium (COLACE) 100 MG capsule Take 100 mg by mouth daily.    Marland Kitchen escitalopram (LEXAPRO) 20 MG tablet TAKE (1) TABLET BY MOUTH AT BEDTIME.    Marland Kitchen loratadine (CLARITIN) 10 MG tablet Take 10 mg by mouth daily as needed for allergies.     . RaNITidine HCl (ZANTAC PO) Take 150 mg by mouth daily as needed.     Marland Kitchen OCEAN 0.65 % nasal spray Place 1 spray into the nose as needed for congestion.    Marland Kitchen ALDACTONE 100 MG tablet TAKE (1) TABLET BY MOUTH TWICE DAILY.    Marland Kitchen XIFAXAN 550 MG TABS tablet TAKE (1) TABLET BY MOUTH TWICE DAILY.    Marland Kitchen FERREX 150 FORTE 150-1-25 MG Take 1 capsule by mouth daily. (Patient not taking: Reported on 08/29/2015)     Review of Systems PER HPI OTHERWISE ALL SYSTEMS ARE NEGATIVE.    Objective:   Physical Exam  Constitutional: She is oriented to person, place, and time. She appears well-developed and well-nourished. No distress.  HENT:  Head: Normocephalic and atraumatic.  Mouth/Throat: Oropharynx is clear and moist. No oropharyngeal exudate.  Eyes: Pupils are equal, round, and reactive to light. No scleral icterus.  Neck: Normal range of motion. Neck supple.  Cardiovascular: Normal rate, regular rhythm and normal heart sounds.   Pulmonary/Chest: Effort  normal and breath sounds normal. No respiratory distress.  Abdominal: Soft. Bowel sounds are normal. She exhibits no distension. There is no tenderness.  Musculoskeletal: She exhibits no edema.  Lymphadenopathy:    She has no cervical adenopathy.  Neurological: She is alert and oriented to person, place, and time.  Psychiatric: She has a normal mood and affect.  Vitals reviewed.         Assessment & Plan:

## 2015-08-30 LAB — METHYLMALONIC ACID, SERUM: Methylmalonic Acid, Quantitative: 330 nmol/L (ref 0–378)

## 2015-09-04 ENCOUNTER — Ambulatory Visit (HOSPITAL_COMMUNITY)
Admission: RE | Admit: 2015-09-04 | Discharge: 2015-09-04 | Disposition: A | Discharge status: Home / Self Care | Payer: BLUE CROSS/BLUE SHIELD | Source: Ambulatory Visit | Attending: Gastroenterology | Admitting: Gastroenterology

## 2015-09-04 DIAGNOSIS — K7030 Alcoholic cirrhosis of liver without ascites: Secondary | ICD-10-CM

## 2015-09-04 DIAGNOSIS — K8020 Calculus of gallbladder without cholecystitis without obstruction: Secondary | ICD-10-CM | POA: Insufficient documentation

## 2015-09-09 ENCOUNTER — Telehealth: Payer: Self-pay

## 2015-09-09 NOTE — Telephone Encounter (Signed)
Pt is aware.  

## 2015-09-09 NOTE — Telephone Encounter (Signed)
PLEASE CALL PT. CONTINUE BUMEX AND ALDACTONE TO PREVENT FLUID RETENTION.

## 2015-09-09 NOTE — Telephone Encounter (Signed)
Pt called the office on Saturday and left a voicemail. She had stopped taking the aldactone and the bumex. She weighed herself on Saturday and she had gained 11 pounds. She restarted the meds and wanted to let SLF know.

## 2015-09-19 ENCOUNTER — Other Ambulatory Visit: Payer: Self-pay | Admitting: Nurse Practitioner

## 2015-09-23 ENCOUNTER — Other Ambulatory Visit: Payer: Self-pay | Admitting: Gastroenterology

## 2015-10-08 ENCOUNTER — Other Ambulatory Visit: Payer: Self-pay | Admitting: Family Medicine

## 2015-10-09 ENCOUNTER — Encounter (HOSPITAL_COMMUNITY): Payer: BLUE CROSS/BLUE SHIELD | Attending: Oncology

## 2015-10-09 DIAGNOSIS — D500 Iron deficiency anemia secondary to blood loss (chronic): Secondary | ICD-10-CM | POA: Insufficient documentation

## 2015-10-09 DIAGNOSIS — K7469 Other cirrhosis of liver: Secondary | ICD-10-CM | POA: Insufficient documentation

## 2015-10-09 LAB — IRON AND TIBC
Iron: 95 ug/dL (ref 28–170)
SATURATION RATIOS: 24 % (ref 10.4–31.8)
TIBC: 399 ug/dL (ref 250–450)
UIBC: 304 ug/dL

## 2015-10-09 LAB — CBC WITH DIFFERENTIAL/PLATELET
BASOS ABS: 0 10*3/uL (ref 0.0–0.1)
BASOS PCT: 0 %
EOS PCT: 2 %
Eosinophils Absolute: 0.2 10*3/uL (ref 0.0–0.7)
HCT: 43.3 % (ref 36.0–46.0)
Hemoglobin: 14.9 g/dL (ref 12.0–15.0)
Lymphocytes Relative: 17 %
Lymphs Abs: 1.2 10*3/uL (ref 0.7–4.0)
MCH: 32 pg (ref 26.0–34.0)
MCHC: 34.4 g/dL (ref 30.0–36.0)
MCV: 93.1 fL (ref 78.0–100.0)
MONO ABS: 0.4 10*3/uL (ref 0.1–1.0)
Monocytes Relative: 5 %
NEUTROS ABS: 5.4 10*3/uL (ref 1.7–7.7)
Neutrophils Relative %: 76 %
Platelets: 203 10*3/uL (ref 150–400)
RBC: 4.65 MIL/uL (ref 3.87–5.11)
RDW: 13.6 % (ref 11.5–15.5)
WBC: 7.2 10*3/uL (ref 4.0–10.5)

## 2015-10-09 LAB — COMPREHENSIVE METABOLIC PANEL
ALBUMIN: 4.4 g/dL (ref 3.5–5.0)
ALK PHOS: 76 U/L (ref 38–126)
ALT: 20 U/L (ref 14–54)
ANION GAP: 7 (ref 5–15)
AST: 25 U/L (ref 15–41)
BUN: 16 mg/dL (ref 6–20)
CHLORIDE: 104 mmol/L (ref 101–111)
CO2: 26 mmol/L (ref 22–32)
Calcium: 9.5 mg/dL (ref 8.9–10.3)
Creatinine, Ser: 1.18 mg/dL — ABNORMAL HIGH (ref 0.44–1.00)
GFR calc Af Amer: 58 mL/min — ABNORMAL LOW (ref >60)
GFR calc non Af Amer: 50 mL/min — ABNORMAL LOW (ref >60)
GLUCOSE: 152 mg/dL — AB (ref 65–99)
POTASSIUM: 3.9 mmol/L (ref 3.5–5.1)
SODIUM: 137 mmol/L (ref 135–145)
Total Bilirubin: 0.8 mg/dL (ref 0.3–1.2)
Total Protein: 8.2 g/dL — ABNORMAL HIGH (ref 6.5–8.1)

## 2015-10-09 LAB — FERRITIN: Ferritin: 120 ng/mL (ref 11–307)

## 2015-10-14 ENCOUNTER — Ambulatory Visit: Payer: BLUE CROSS/BLUE SHIELD | Admitting: Family Medicine

## 2015-10-14 ENCOUNTER — Other Ambulatory Visit: Payer: Self-pay | Admitting: Family Medicine

## 2015-10-14 NOTE — Telephone Encounter (Signed)
Duplicate this was also sent to me

## 2015-10-14 NOTE — Telephone Encounter (Signed)
May have one refill needs office visit 

## 2015-10-19 ENCOUNTER — Other Ambulatory Visit: Payer: Self-pay | Admitting: Gastroenterology

## 2015-10-21 ENCOUNTER — Telehealth: Payer: Self-pay

## 2015-10-21 NOTE — Telephone Encounter (Signed)
Pt called and said for the last couple of days she has had just a few drops of bright red blood, but Dr. Oneida Alar had told her she does have a large hemorrhoid. She has felt a little pressure, but no pain. She just wants Dr. Oneida Alar to be aware and let her know if it is anything to be concerned about. She is dog sitting at her sister's house in [place] this week. She can be reached on her cell @ [phone]. I told her it does not sound like anything to be alarmed with at this time. She is aware Dr. Oneida Alar is on PAL today and will be at the hospital tomorrow and the remainder of the week, but I will let her know.

## 2015-10-21 NOTE — Telephone Encounter (Signed)
Pt called back and said Larene Pickett called her and told her to discuss her Bumex with Dr. Oneida Alar. Dr. Oneida Alar has been prescribing Bumex. Pt said she was taken off at one time but then had to start back on it because she gained 12 pounds.  She has enough for several days.  Routing to Dr. Oneida Alar to send in refills.

## 2015-10-22 MED ORDER — BUMETANIDE 1 MG PO TABS: ORAL_TABLET | ORAL | 3 refills | Status: DC

## 2015-10-22 NOTE — Telephone Encounter (Signed)
Pt is aware what to do for the rectal bleeding and also that The Rx has been sent in for the Bumex.

## 2015-10-22 NOTE — Telephone Encounter (Signed)
PLEASE CALL PT. Rx sent FOR 90 D SUPPLY, REFIL Lx 1YEAR.

## 2015-10-22 NOTE — Telephone Encounter (Signed)
Dr. Oneida Alar, please advise on the bright red blood.

## 2015-10-22 NOTE — Telephone Encounter (Signed)
PLEASE CALL PT. She can use Preparation H cream QID FOR 7 DAYS TO RELIEVE RECTAL DISCOMFORT AND BLEEDING. PLEASE CALL IN 3 WEEKS IF SYMPTOMS ARE NOT IMPROVED. GO TO NEAREST ED IF THE BLEEDING IS HEAVY.

## 2015-10-31 ENCOUNTER — Encounter: Payer: Self-pay | Admitting: Family Medicine

## 2015-10-31 ENCOUNTER — Ambulatory Visit (INDEPENDENT_AMBULATORY_CARE_PROVIDER_SITE_OTHER): Payer: BLUE CROSS/BLUE SHIELD | Admitting: Family Medicine

## 2015-10-31 VITALS — BP 112/60 | Ht 69.0 in | Wt 247.0 lb

## 2015-10-31 DIAGNOSIS — G4700 Insomnia, unspecified: Secondary | ICD-10-CM | POA: Diagnosis not present

## 2015-10-31 DIAGNOSIS — F329 Major depressive disorder, single episode, unspecified: Secondary | ICD-10-CM

## 2015-10-31 DIAGNOSIS — F32A Depression, unspecified: Secondary | ICD-10-CM

## 2015-10-31 MED ORDER — BUPROPION HCL ER (SR) 100 MG PO TB12: ORAL_TABLET | ORAL | 5 refills | Status: DC

## 2015-10-31 MED ORDER — ESCITALOPRAM OXALATE 20 MG PO TABS: ORAL_TABLET | ORAL | 5 refills | Status: DC

## 2015-10-31 NOTE — Progress Notes (Signed)
   Subjective:    Patient ID: Deanna Thompson, female    DOB: 11-Aug-1955, 60 y.o.   MRN: [id number]  HPI  Patient arrives for a follow up on depression. Patient states she is doing well.  Results for orders placed or performed in visit on 10/09/15  CBC with Differential  Result Value Ref Range   WBC 7.2 4.0 - 10.5 K/uL   RBC 4.65 3.87 - 5.11 MIL/uL   Hemoglobin 14.9 12.0 - 15.0 g/dL   HCT 43.3 36.0 - 46.0 %   MCV 93.1 78.0 - 100.0 fL   MCH 32.0 26.0 - 34.0 pg   MCHC 34.4 30.0 - 36.0 g/dL   RDW 13.6 11.5 - 15.5 %   Platelets 203 150 - 400 K/uL   Neutrophils Relative % 76 %   Neutro Abs 5.4 1.7 - 7.7 K/uL   Lymphocytes Relative 17 %   Lymphs Abs 1.2 0.7 - 4.0 K/uL   Monocytes Relative 5 %   Monocytes Absolute 0.4 0.1 - 1.0 K/uL   Eosinophils Relative 2 %   Eosinophils Absolute 0.2 0.0 - 0.7 K/uL   Basophils Relative 0 %   Basophils Absolute 0.0 0.0 - 0.1 K/uL  Comprehensive metabolic panel  Result Value Ref Range   Sodium 137 135 - 145 mmol/L   Potassium 3.9 3.5 - 5.1 mmol/L   Chloride 104 101 - 111 mmol/L   CO2 26 22 - 32 mmol/L   Glucose, Bld 152 (H) 65 - 99 mg/dL   BUN 16 6 - 20 mg/dL   Creatinine, Ser 1.18 (H) 0.44 - 1.00 mg/dL   Calcium 9.5 8.9 - 10.3 mg/dL   Total Protein 8.2 (H) 6.5 - 8.1 g/dL   Albumin 4.4 3.5 - 5.0 g/dL   AST 25 15 - 41 U/L   ALT 20 14 - 54 U/L   Alkaline Phosphatase 76 38 - 126 U/L   Total Bilirubin 0.8 0.3 - 1.2 mg/dL   GFR calc non Af Amer 50 (L) >60 mL/min   GFR calc Af Amer 58 (L) >60 mL/min   Anion gap 7 5 - 15  Iron and TIBC  Result Value Ref Range   Iron 95 28 - 170 ug/dL   TIBC 399 250 - 450 ug/dL   Saturation Ratios 24 10.4 - 31.8 %   UIBC 304 ug/dL  Ferritin  Result Value Ref Range   Ferritin 120 11 - 307 ng/mL   Exercise is so so  Having some hematochezia with internal  Bleeding  And having some chronic pain with exertion   Chronic ankle pain and flat gfeet with pronation and ankle issues   From standpoint in  mental health doing quite well. Compliant with medications. No obvious side effects. Would like to stand or exact same dose of medication  Review of Systems No headache, no major weight loss or weight gain, no chest pain no back pain abdominal pain no change in bowel habits complete ROS otherwise negative     Objective:   Physical Exam Alert vitals stable, NAD. Blood pressure good on repeat. HEENT normal. Lungs clear. Heart regular rate and rhythm.        Assessment & Plan:  Impression depression with element of anxiety clinically stable plan patient maintain same medications. Diet exercise discussed 6 months worth meds written WSL

## 2015-11-02 ENCOUNTER — Other Ambulatory Visit: Payer: Self-pay | Admitting: Gastroenterology

## 2015-11-11 ENCOUNTER — Other Ambulatory Visit: Payer: Self-pay | Admitting: Family Medicine

## 2015-11-20 ENCOUNTER — Encounter (HOSPITAL_COMMUNITY): Payer: BLUE CROSS/BLUE SHIELD | Attending: Oncology

## 2015-11-20 DIAGNOSIS — K7469 Other cirrhosis of liver: Secondary | ICD-10-CM | POA: Diagnosis present

## 2015-11-20 DIAGNOSIS — D500 Iron deficiency anemia secondary to blood loss (chronic): Secondary | ICD-10-CM | POA: Insufficient documentation

## 2015-11-20 LAB — CBC WITH DIFFERENTIAL/PLATELET
BASOS ABS: 0.1 10*3/uL (ref 0.0–0.1)
BASOS PCT: 1 %
Eosinophils Absolute: 0.2 10*3/uL (ref 0.0–0.7)
Eosinophils Relative: 2 %
HEMATOCRIT: 43.8 % (ref 36.0–46.0)
HEMOGLOBIN: 14.9 g/dL (ref 12.0–15.0)
Lymphocytes Relative: 13 %
Lymphs Abs: 1.2 10*3/uL (ref 0.7–4.0)
MCH: 31.5 pg (ref 26.0–34.0)
MCHC: 34 g/dL (ref 30.0–36.0)
MCV: 92.6 fL (ref 78.0–100.0)
MONOS PCT: 10 %
Monocytes Absolute: 0.9 10*3/uL (ref 0.1–1.0)
NEUTROS ABS: 6.9 10*3/uL (ref 1.7–7.7)
NEUTROS PCT: 74 %
Platelets: 243 10*3/uL (ref 150–400)
RBC: 4.73 MIL/uL (ref 3.87–5.11)
RDW: 13.5 % (ref 11.5–15.5)
WBC: 9.2 10*3/uL (ref 4.0–10.5)

## 2015-11-20 LAB — COMPREHENSIVE METABOLIC PANEL
ALBUMIN: 4.3 g/dL (ref 3.5–5.0)
ALK PHOS: 75 U/L (ref 38–126)
ALT: 18 U/L (ref 14–54)
AST: 24 U/L (ref 15–41)
Anion gap: 9 (ref 5–15)
BILIRUBIN TOTAL: 0.6 mg/dL (ref 0.3–1.2)
BUN: 21 mg/dL — AB (ref 6–20)
CALCIUM: 9.6 mg/dL (ref 8.9–10.3)
CO2: 23 mmol/L (ref 22–32)
Chloride: 101 mmol/L (ref 101–111)
Creatinine, Ser: 1.24 mg/dL — ABNORMAL HIGH (ref 0.44–1.00)
GFR calc Af Amer: 54 mL/min — ABNORMAL LOW (ref >60)
GFR calc non Af Amer: 47 mL/min — ABNORMAL LOW (ref >60)
GLUCOSE: 120 mg/dL — AB (ref 65–99)
Potassium: 3.9 mmol/L (ref 3.5–5.1)
Sodium: 133 mmol/L — ABNORMAL LOW (ref 135–145)
TOTAL PROTEIN: 8.3 g/dL — AB (ref 6.5–8.1)

## 2015-11-20 LAB — IRON AND TIBC
Iron: 104 ug/dL (ref 28–170)
Saturation Ratios: 25 % (ref 10.4–31.8)
TIBC: 410 ug/dL (ref 250–450)
UIBC: 306 ug/dL

## 2015-11-20 LAB — FERRITIN: Ferritin: 104 ng/mL (ref 11–307)

## 2015-12-03 ENCOUNTER — Other Ambulatory Visit: Payer: Self-pay | Admitting: Gastroenterology

## 2015-12-12 ENCOUNTER — Other Ambulatory Visit: Payer: Self-pay | Admitting: Family Medicine

## 2015-12-18 ENCOUNTER — Ambulatory Visit (HOSPITAL_COMMUNITY): Payer: BLUE CROSS/BLUE SHIELD | Admitting: Hematology & Oncology

## 2015-12-18 ENCOUNTER — Encounter (HOSPITAL_COMMUNITY): Payer: BLUE CROSS/BLUE SHIELD

## 2016-01-08 ENCOUNTER — Encounter: Payer: Self-pay | Admitting: Gastroenterology

## 2016-01-08 ENCOUNTER — Other Ambulatory Visit: Payer: Self-pay

## 2016-01-08 ENCOUNTER — Ambulatory Visit (INDEPENDENT_AMBULATORY_CARE_PROVIDER_SITE_OTHER): Payer: BLUE CROSS/BLUE SHIELD | Admitting: Gastroenterology

## 2016-01-08 ENCOUNTER — Other Ambulatory Visit: Payer: Self-pay | Admitting: Family Medicine

## 2016-01-08 DIAGNOSIS — K625 Hemorrhage of anus and rectum: Secondary | ICD-10-CM

## 2016-01-08 DIAGNOSIS — K648 Other hemorrhoids: Secondary | ICD-10-CM | POA: Diagnosis not present

## 2016-01-08 MED ORDER — LIDOCAINE-HYDROCORTISONE ACE 3-2.5 % RE KIT: PACK | RECTAL | 0 refills | Status: DC

## 2016-01-08 NOTE — Progress Notes (Signed)
ON RECALL  °

## 2016-01-08 NOTE — Progress Notes (Signed)
Subjective:    Patient ID: Deanna Thompson, female    DOB: January 23, 1956, 60 y.o.   MRN: [id number] [name], MD   HPI HAS A HEMORRHOID. 2-3 WEEKS AGO HAD HEMORRHOID PROBLEM. IT'S BLEEDING BUT IT'S WEIRD. TINY PIECE OF TISSUES BUT WEIRD. FEEL LIKE SOMETIME SHE IS STARTING HER CYCLE. USING PREP H AND RIGHT NOW 80% GONE. SINCE INITIAL FLARE MAKES HER FEEL LIKE SHE HAS [place] TO THE BATHROOM MORE OFTEN. WHEN SHE GOES TO THE BATHROOM FEELS LIKE SHE'S TRYING TO CRAP AROUND A ROCK. MORE REFLUX AND TRYING TO CONTROL WITH RANITIDINE ONCE A DAY. BELCHES ALL THE TIME.    PT DENIES FEVER, CHILLS, HEMATEMESIS, nausea, vomiting, melena, diarrhea, CHEST PAIN, SHORTNESS OF BREATH,  CHANGE IN BOWEL IN HABITS, constipation, abdominal pain, problems swallowing, problems with sedation, heartburn or indigestion.   Past Medical History:  Diagnosis Date  . Abdominal hernia   . Abscess of pulp of tooth 12/12/14   left side  . Anxiety   . Arthritis    back  and down flank  right side more than left  . B12 deficiency 06/12/2015  . Brain aneurysm    METAL COIL PREVENTS MRI IMAGING and CLIP  . Cirrhosis ([place])    Presumably alcoholic cirrhosis  . Complication of anesthesia   . Depression   . ETOH abuse    Quit in 07/2011   . Fatigue    due to Sjogren's disease  . GERD (gastroesophageal reflux disease)   . HTN (hypertension)    no longer medicated, dated on 04/25/12  . IBS (irritable bowel syndrome)   . Iron deficiency anemia due to chronic blood loss   . Myofascial pain   . Panic attack   . PONV (postoperative nausea and vomiting)   . Psoriatic arthritis ([place])   . Right inguinal hernia 08/16/2012  . Seizures ([place]) 1997   1 seizure from coil insertion but no more seizures and no meds for seizures  . Sjogren's disease ([place])   . [place] HEAD 02/03/2007   Qualifier: Diagnosis of  By: Aline Brochure MD, Dorothyann Peng    . 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([place])     Past Surgical History:    Procedure Laterality Date  . AGILE CAPSULE N/A 06/21/2014   Procedure: AGILE CAPSULE;  Surgeon: Danie Binder, MD;  [facility];  Service: Endoscopy;  Laterality: N/A;  0700  . BIOPSY  10/13/2011   SLF: mild gastritis/duodenal polypoid lesion   . CEREBRAL ANEURYSM REPAIR  1997  . COLONOSCOPY  10/13/2011   [id number] hemorrhoids/varices rectal/lesion in ascending colon(HYPERPLASTIC POLYP)  . COLONOSCOPY N/A 01/17/2013   SLF: 1. Internal hemorrhoids 2. Ascending colon polyps 3.  Rectal varices.   . ESOPHAGOGASTRODUODENOSCOPY N/A 06/29/2014   SLF: 1. anemia due to polypoid lesions in teh antrum, and duodenal polyps 2. single duodenum AVM  . GIVENS CAPSULE STUDY N/A 06/29/2014   Procedure: GIVENS CAPSULE STUDY;  Surgeon: Danie Binder, MD;  [facility];  Service: Endoscopy;  Laterality: N/A;  . GIVENS CAPSULE STUDY N/A 07/13/2014   Procedure: GIVENS CAPSULE STUDY;  Surgeon: Danie Binder, MD;  [facility];  Service: Endoscopy;  Laterality: N/A;  700  . INGUINAL HERNIA REPAIR Right 08/28/2013   Procedure: RIGHT NGUINAL HERNIORRHAPHY WITH MESH;  Surgeon: Jamesetta So, MD;  Location: [facility];  Service: General;  Laterality: Right;  . INSERTION OF MESH N/A 06/09/2013   Procedure: INSERTION OF MESH;  Surgeon: Jamesetta So, MD;  Location: [facility];  Service: General;  Laterality: N/A;  . INSERTION OF MESH Right 08/28/2013   Procedure: INSERTION OF MESH;  Surgeon: Jamesetta So, MD;  Location: [facility];  Service: General;  Laterality: Right;  . PARACENTESIS  Feb 2014   10.7 liters  . TOOTH EXTRACTION Left 12/12/14  . UMBILICAL HERNIA REPAIR N/A 06/09/2013   Procedure: UMBILICAL HERNIORRHAPHY WITH MESH;  Surgeon: Jamesetta So, MD;  Location: [facility];  Service: General;  Laterality: N/A;    Allergies  Allergen Reactions  . Omeprazole Shortness Of Breath  . Other Shortness Of Breath and Rash    Red Meat Causes shortness of breath, rash and flu like symptoms.   .  Phenytoin Rash  . Keflex [Cephalexin] Other (See Comments)    Severe yeast infection  . Phenergan [Promethazine Hcl] Other (See Comments)    Not in right state of mind, HALLUCINATION     Review of Systems PER HPI OTHERWISE ALL SYSTEMS ARE NEGATIVE.    Objective:   Physical Exam  Constitutional: She is oriented to person, place, and time. She appears well-developed and well-nourished. No distress.  HENT:  Head: Normocephalic and atraumatic.  Mouth/Throat: Oropharynx is clear and moist. No oropharyngeal exudate.  Eyes: Pupils are equal, round, and reactive to light. No scleral icterus.  Neck: Normal range of motion. Neck supple.  Cardiovascular: Normal rate, regular rhythm and normal heart sounds.   Pulmonary/Chest: Effort normal and breath sounds normal. No respiratory distress.  Abdominal: Soft. Bowel sounds are normal. She exhibits no distension. There is no tenderness.  Genitourinary: Rectal exam shows internal hemorrhoid. Rectal exam shows no external hemorrhoid and no mass.     Genitourinary Comments: hemorrhoid  Musculoskeletal: She exhibits no edema.  Lymphadenopathy:    She has no cervical adenopathy.  Neurological: She is alert and oriented to person, place, and time.  Psychiatric: She has a normal mood and affect.  Vitals reviewed.     Assessment & Plan:

## 2016-01-08 NOTE — Assessment & Plan Note (Addendum)
SYMPTOMS NOT IDEALLY CONTROLLED.  DRINK WATER TO KEEP YOUR URINE LIGHT YELLOW.  FOLLOW A HIGH FIBER DIET. AVOID ITEMS THAT CAUSE BLOATING & GAS.  USE APOTHECARY Hemorrhoid cream FOUR TIMES  A DAY FOR 2 WEEKS TO RELIEVE RECTAL PAIN/PRESSURE/BLEEDING. COMPLETE FLEXIBLE SIGMOIDOSCOPY/POSSIBLE HEMORRHOID BANDING OCT 24. DISCUSSED PROCEDURE, BENEFITS, & RISKS: < 1% chance of medication reaction, PERFORATION, PELVIC VEIN SEPSIS, OR bleeding. FOLLOW UP IN 6 MOS

## 2016-01-08 NOTE — Patient Instructions (Addendum)
DRINK WATER TO KEEP YOUR URINE LIGHT YELLOW.  FOLLOW A HIGH FIBER DIET. AVOID ITEMS THAT CAUSE BLOATING & GAS. SEE INFO BELOW.  USE APOTHECARY Hemorrhoid cream FOUR TIMES  A DAY FOR 2 WEEKS TO RELIEVE RECTAL PAIN, PRESSURE, OR BLEEDING.  COMPLETE FLEXIBLE SIGMOIDOSCOPY/HEMORRHOID BANDING OCT 24.   FOLLOW UP IN 6 MOS.     HEMORRHOIDAL BANDING COMPLICATIONS:  COMMON: 1. MINOR PAIN  UNCOMMON: 1. ABSCESS 2. BAND FALLS OFF 3. PROLAPSE OF HEMORRHOIDS AND PAIN 4. ULCER BLEEDING  A. USUALLY SELF-LIMITED: MAY LAST 3-5 DAYS  B. MAY REQUIRE INTERVENTION: 1-2 WEEKS AFTER INTERACTIONS 5. NECROTIZING PELVIC SEPSIS  A. SYMPTOMS: FEVER, PAIN, DIFFICULTY URINATING

## 2016-01-09 NOTE — Progress Notes (Signed)
cc'ed to pcp °

## 2016-01-14 ENCOUNTER — Encounter (HOSPITAL_COMMUNITY): Payer: BLUE CROSS/BLUE SHIELD | Attending: Hematology & Oncology | Admitting: Hematology & Oncology

## 2016-01-14 ENCOUNTER — Encounter (HOSPITAL_COMMUNITY): Payer: BLUE CROSS/BLUE SHIELD

## 2016-01-14 VITALS — BP 124/75 | HR 78 | Temp 97.7°F | Resp 20 | Wt 249.2 lb

## 2016-01-14 DIAGNOSIS — E538 Deficiency of other specified B group vitamins: Secondary | ICD-10-CM | POA: Diagnosis not present

## 2016-01-14 DIAGNOSIS — K7460 Unspecified cirrhosis of liver: Secondary | ICD-10-CM

## 2016-01-14 DIAGNOSIS — D500 Iron deficiency anemia secondary to blood loss (chronic): Secondary | ICD-10-CM

## 2016-01-14 DIAGNOSIS — K7030 Alcoholic cirrhosis of liver without ascites: Secondary | ICD-10-CM

## 2016-01-14 LAB — CBC WITH DIFFERENTIAL/PLATELET
BASOS PCT: 1 %
Basophils Absolute: 0 10*3/uL (ref 0.0–0.1)
EOS ABS: 0.2 10*3/uL (ref 0.0–0.7)
EOS PCT: 3 %
HCT: 41.6 % (ref 36.0–46.0)
HEMOGLOBIN: 14.2 g/dL (ref 12.0–15.0)
Lymphocytes Relative: 23 %
Lymphs Abs: 1.8 10*3/uL (ref 0.7–4.0)
MCH: 31.4 pg (ref 26.0–34.0)
MCHC: 34.1 g/dL (ref 30.0–36.0)
MCV: 92 fL (ref 78.0–100.0)
Monocytes Absolute: 0.5 10*3/uL (ref 0.1–1.0)
Monocytes Relative: 6 %
NEUTROS PCT: 67 %
Neutro Abs: 5.3 10*3/uL (ref 1.7–7.7)
PLATELETS: 211 10*3/uL (ref 150–400)
RBC: 4.52 MIL/uL (ref 3.87–5.11)
RDW: 13.9 % (ref 11.5–15.5)
WBC: 7.7 10*3/uL (ref 4.0–10.5)

## 2016-01-14 LAB — COMPREHENSIVE METABOLIC PANEL
ALBUMIN: 4.2 g/dL (ref 3.5–5.0)
ALK PHOS: 70 U/L (ref 38–126)
ALT: 19 U/L (ref 14–54)
ANION GAP: 11 (ref 5–15)
AST: 27 U/L (ref 15–41)
BUN: 19 mg/dL (ref 6–20)
CALCIUM: 9.3 mg/dL (ref 8.9–10.3)
CHLORIDE: 99 mmol/L — AB (ref 101–111)
CO2: 25 mmol/L (ref 22–32)
CREATININE: 1.19 mg/dL — AB (ref 0.44–1.00)
GFR calc non Af Amer: 49 mL/min — ABNORMAL LOW (ref >60)
GFR, EST AFRICAN AMERICAN: 57 mL/min — AB (ref >60)
GLUCOSE: 153 mg/dL — AB (ref 65–99)
Potassium: 3.7 mmol/L (ref 3.5–5.1)
SODIUM: 135 mmol/L (ref 135–145)
Total Bilirubin: 0.5 mg/dL (ref 0.3–1.2)
Total Protein: 7.9 g/dL (ref 6.5–8.1)

## 2016-01-14 LAB — IRON AND TIBC
Iron: 60 ug/dL (ref 28–170)
SATURATION RATIOS: 15 % (ref 10.4–31.8)
TIBC: 391 ug/dL (ref 250–450)
UIBC: 331 ug/dL

## 2016-01-14 LAB — FERRITIN: FERRITIN: 89 ng/mL (ref 11–307)

## 2016-01-14 MED ORDER — CYANOCOBALAMIN 1000 MCG/ML IJ SOLN
1000.0000 ug | Freq: Once | INTRAMUSCULAR | Status: AC
  Administered 2016-01-14: 1000 ug via INTRAMUSCULAR

## 2016-01-14 MED ORDER — CYANOCOBALAMIN 1000 MCG/ML IJ SOLN
INTRAMUSCULAR | Status: AC
  Filled 2016-01-14: qty 1

## 2016-01-14 NOTE — Patient Instructions (Signed)
[place] at [facility] Discharge Instructions  RECOMMENDATIONS MADE BY THE CONSULTANT AND ANY TEST RESULTS WILL BE SENT TO YOUR REFERRING PHYSICIAN.  B12 injection today and every 6 weeks. Lab work every 6 weeks. MD appointment in 6 months. Return as scheduled.  Thank you for choosing [place] at [facility] to provide your oncology and hematology care.  To afford each patient quality time with our provider, please arrive at least 15 minutes before your scheduled appointment time.   Beginning January 23rd 2017 lab work for the [facility] will be done in the  Main lab at [facility] on 1st floor. If you have a lab appointment with the [facility] please come in thru the  Main Entrance and check in at the main information desk  You need to re-schedule your appointment should you arrive 10 or more minutes late.  We strive to give you quality time with our providers, and arriving late affects you and other patients whose appointments are after yours.  Also, if you no show three or more times for appointments you may be dismissed from the clinic at the providers discretion.     Again, thank you for choosing [facility].  Our hope is that these requests will decrease the amount of time that you wait before being seen by our physicians.       _____________________________________________________________  Should you have questions after your visit to [facility], please contact our office at [phone] between the hours of 8:30 a.m. and 4:30 p.m.  Voicemails left after 4:30 p.m. will not be returned until the following business day.  For prescription refill requests, have your pharmacy contact our office.         Resources For Cancer Patients and their Caregivers ? American Cancer Society: Can assist with transportation, wigs, general needs, runs Look Good Feel Better.        956-273-4774 ? Cancer  Care: Provides financial assistance, online support groups, medication/co-pay assistance.  1-800-813-HOPE 901-886-0597) ?  [place] Assists [place] cancer patients and their families through emotional , educational and financial support.  (724) 755-4083 ? [place] DSS Where to apply for food stamps, Medicaid and utility assistance. 249 274 4328 ? [facility]: Transportation to medical appointments. 4408625458 ? Social Security Administration: May apply for disability if have a Stage IV cancer. 934-331-7724 228-203-1508 ? [employer], Disability and Transit Services: Assists with nutrition, care and transit needs. [place] Support Programs: @10RELATIVEDAYS @ > Cancer Support Group  2nd Tuesday of the month 1pm-2pm, Journey Room  > Creative Journey  3rd Tuesday of the month 1130am-1pm, Journey Room  > Look Good Feel Better  1st Wednesday of the month 10am-12 noon, Journey Room (Call [place] to register [phone])

## 2016-01-14 NOTE — Progress Notes (Signed)
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 female returns for iron deficiency and anemia.  Patient reports feeling well. She continues to take oral iron twice a day. She denies pagophagia or pica. Energy is baseline.  She denies BRBPR.   Appetite is good. She continues to follow with her PCP. Reports no change in headaches, stomach pain or constipation.   She has not had a mammogram this year.  Last IV iron was in March. She continues on B12 supplementation.   MEDICAL HISTORY: Past Medical History:  Diagnosis Date  . Abdominal hernia   . Abscess of pulp of tooth 12/12/14   left side  . Anxiety   . Arthritis    back  and down flank  right side more than left  . B12 deficiency 06/12/2015  . Brain aneurysm    METAL COIL PREVENTS MRI IMAGING and CLIP  . Cirrhosis ([place])    Presumably alcoholic cirrhosis  . Complication of anesthesia   . Depression   . ETOH abuse    Quit in 07/2011   . Fatigue    due to Sjogren's disease  . GERD (gastroesophageal reflux disease)   . HTN (hypertension)    no longer medicated, dated on 04/25/12  . IBS (irritable bowel syndrome)   . Iron deficiency anemia due to chronic blood loss   . Myofascial pain   . Panic attack   . PONV (postoperative nausea and vomiting)   . Psoriatic arthritis ([place])   . Right inguinal hernia 08/16/2012  . Seizures ([place]) 1997   1 seizure from coil insertion but no more seizures and no meds for seizures  . Sjogren's disease ([place])   .  [place] HEAD 02/03/2007   Qualifier: Diagnosis of  By: Aline Brochure MD, Dorothyann Peng    . 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([place])     has Cirrhosis ([place]); Colon polyp, hyperplastic; Rash and nonspecific skin eruption; Labial swelling; Depression; Insomnia; Iron deficiency anemia due to chronic blood loss; Psoriatic arthritis ([place]); B12 deficiency; Nutritional anemia; and Hemorrhoids, internal, with bleeding on her problem list.     is allergic to omeprazole; other; phenytoin; keflex [cephalexin]; and phenergan [promethazine hcl].  Ms. Hannold does not currently have medications on file.   SURGICAL HISTORY: Past Surgical History:  Procedure Laterality Date  . AGILE CAPSULE N/A 06/21/2014   Procedure: AGILE CAPSULE;  Surgeon: Danie Binder, MD;  [facility];  Service: Endoscopy;  Laterality: N/A;  0700  . BIOPSY  10/13/2011   SLF: mild gastritis/duodenal polypoid lesion   . CEREBRAL ANEURYSM REPAIR  1997  . COLONOSCOPY  10/13/2011   [id number] hemorrhoids/varices rectal/lesion in ascending colon(HYPERPLASTIC POLYP)  . COLONOSCOPY N/A 01/17/2013   SLF: 1. Internal hemorrhoids 2. Ascending colon polyps 3.  Rectal varices.   . ESOPHAGOGASTRODUODENOSCOPY N/A 06/29/2014   SLF: 1. anemia due to polypoid lesions in teh antrum, and duodenal polyps 2. single duodenum AVM  . GIVENS CAPSULE STUDY N/A 06/29/2014   Procedure: GIVENS CAPSULE STUDY;  Surgeon: Danie Binder, MD;  Location:  [facility];  Service: Endoscopy;  Laterality: N/A;  . GIVENS CAPSULE STUDY N/A 07/13/2014   Procedure: GIVENS CAPSULE STUDY;  Surgeon: Danie Binder, MD;  Location: [facility];  Service: Endoscopy;  Laterality: N/A;  700  . INGUINAL HERNIA REPAIR Right 08/28/2013   Procedure: RIGHT NGUINAL HERNIORRHAPHY WITH MESH;  Surgeon: Jamesetta So, MD;  Location: [facility] ORS;  Service: General;  Laterality: Right;  . INSERTION OF MESH N/A 06/09/2013   Procedure: INSERTION OF MESH;  Surgeon: Jamesetta So,  MD;  Location: [facility] ORS;  Service: General;  Laterality: N/A;  . INSERTION OF MESH Right 08/28/2013   Procedure: INSERTION OF MESH;  Surgeon: Jamesetta So, MD;  Location: [facility] ORS;  Service: General;  Laterality: Right;  . PARACENTESIS  Feb 2014   10.7 liters  . TOOTH EXTRACTION Left 12/12/14  . UMBILICAL HERNIA REPAIR N/A 06/09/2013   Procedure: UMBILICAL HERNIORRHAPHY WITH MESH;  Surgeon: Jamesetta So, MD;  Location: [facility] ORS;  Service: General;  Laterality: N/A;    SOCIAL HISTORY: Social History   Social History  . Marital status: Married    Spouse name: N/A  . Number of children: 0  . Years of education: N/A   Occupational History  .  Retired   Social History Main Topics  . Smoking status: Former Smoker    Packs/day: 2.00    Years: 12.00    Types: Cigarettes    Quit date: 03/31/1995  . Smokeless tobacco: Never Used     Comment: quit in 1997  . Alcohol use No     Comment: Bottle of wine a day for years, but quit in 07/2011  . Drug use: No  . Sexual activity: Not Currently    Birth control/ protection: Post-menopausal   Other Topics Concern  . Not on file   Social History Narrative   Quit drinking alcohol 3 weeks ago. No drugs, no IVDU.    Married 35 yrs. No children, retired from [employer]. Quit smoking in 1998 Had cerebral aneurysm Hasn't drank alcohol since 5/13, she stated she "let the chardonnay train go down the road" due to cirrhosis; Was previously drinking 3 bottles a day.    FAMILY HISTORY: Family History  Problem Relation Age of Onset  . Cirrhosis Mother     nash  . Colon cancer Neg Hx   . Inflammatory bowel disease Neg Hx   . Asthma    . Heart disease    . COPD Father   . Heart disease Father    Mother deceased at 39 yo, fatty liver/cirrhosis Father deceased at 75 yo, MI 1 brother 1 sister 1 sister deceased, had surgery had been on xanax, coded - brain dead  Review of Systems  Constitutional: Negative for fever, chills, weight loss and  malaise/fatigue.  HENT: Negative for congestion, hearing loss, nosebleeds, sore throat and tinnitus.   Eyes: Negative for blurred vision, double vision, pain and discharge.  Respiratory: Negative for cough, hemoptysis, sputum production, shortness of breath and wheezing.   Cardiovascular: Negative for chest pain, palpitations, claudication, leg swelling and PND.  Gastrointestinal: Negative for heartburn, nausea, vomiting, abdominal pain, diarrhea, blood in stool and melena.  Genitourinary: Negative for dysuria, urgency, frequency and hematuria.  Musculoskeletal: Negative for myalgias, joint pain and falls.  Skin: Negative for itching and rash.  Neurological: Positive for headaches. Negative for tingling, tremors, sensory change, speech change, focal weakness, seizures, loss of consciousness, and weakness.  Headaches from Fibromyalgia. Endo/Heme/Allergies: Does  not bruise/bleed easily.  Psychiatric/Behavioral: Positive for memory loss. Negative for depression, suicidal ideas and substance abuse. The patient is not nervous/anxious and does not have insomnia.   14 point review of systems was performed and is negative except as detailed under history of present illness and above  PHYSICAL EXAMINATION  ECOG PERFORMANCE STATUS: 1 - Symptomatic but completely ambulatory  Vitals:   01/14/16 1300  BP: 124/75  Pulse: 78  Resp: 20  Temp: 97.7 F (36.5 C)    Physical Exam  Constitutional: She is oriented to person, place, and time and well-developed, well-nourished, and in no distress.  HENT:  Head: Normocephalic and atraumatic.  Eyes: Pupils are equal, round, and reactive to light.  Neck: Normal range of motion. Neck supple.  Cardiovascular: Normal rate, regular rhythm and normal heart sounds.   Pulmonary/Chest: Effort normal and breath sounds normal.  Abdominal: Soft. Bowel sounds are normal. No HSM, no rebound or guarding Musculoskeletal: Normal range of motion.  Neurological: She is  alert and oriented to person, place, and time. CN II - XII grossly intact, non focal Skin: Skin is warm and dry.    LABORATORY DATA: I have reviewed the data as listed.  CBC    Component Value Date/Time   WBC 7.7 01/14/2016 1317   RBC 4.52 01/14/2016 1317   HGB 14.2 01/14/2016 1317   HCT 41.6 01/14/2016 1317   HCT 41 07/30/2011 0743   PLT 211 01/14/2016 1317   MCV 92.0 01/14/2016 1317   MCV 104.3 07/30/2011 0743   MCH 31.4 01/14/2016 1317   MCHC 34.1 01/14/2016 1317   RDW 13.9 01/14/2016 1317   LYMPHSABS 1.8 01/14/2016 1317   MONOABS 0.5 01/14/2016 1317   EOSABS 0.2 01/14/2016 1317   BASOSABS 0.0 01/14/2016 1317   CMP     Component Value Date/Time   NA 135 01/14/2016 1317   NA 138 07/30/2011 0741   K 3.7 01/14/2016 1317   K 3.8 07/30/2011 0741   CL 99 (L) 01/14/2016 1317   CO2 25 01/14/2016 1317   GLUCOSE 153 (H) 01/14/2016 1317   BUN 19 01/14/2016 1317   BUN 4 07/30/2011 0741   CREATININE 1.19 (H) 01/14/2016 1317   CREATININE 1.33 (H) 08/05/2015 1310   CALCIUM 9.3 01/14/2016 1317   PROT 7.9 01/14/2016 1317   ALBUMIN 4.2 01/14/2016 1317   ALBUMIN 3.0 07/30/2011 0741   AST 27 01/14/2016 1317   AST 171 07/30/2011 0741   ALT 19 01/14/2016 1317   ALKPHOS 70 01/14/2016 1317   ALKPHOS 111 07/30/2011 0741   BILITOT 0.5 01/14/2016 1317   BILITOT 3.6 07/30/2011 0741   GFRNONAA 49 (L) 01/14/2016 1317   GFRNONAA 59 (L) 02/15/2015 1228   GFRAA 57 (L) 01/14/2016 1317   GFRAA 68 02/15/2015 1228   Results for Deanna Thompson, Deanna Thompson (MRN [id number])  Ref. Range 07/16/2015 11:00 08/28/2015 09:34 10/09/2015 10:12 11/20/2015 10:25 01/14/2016 13:17  Ferritin Latest Ref Range: 11 - 307 ng/mL 165 176 120 104 89      ASSESSMENT and THERAPY PLAN:   Severe microcytic Anemia with serum ferritin 5 ng/ml Cirrhosis Givens Capsule with AVM in the distal jejunum or proximal ileum History of folate deficiency 2.8 ng/ml EGD 06/29/2014 with polypoid lesions in the duodenum, single duodenal  AVM Colonoscopy on 01/17/2013 with rectal varices, polyps and hemorrhoids Iron deficiency anemia secondary to chronic GI blood loss  Ferritin level today is acceptable. She would like to hold off on IV iron when possible because of cost. Tommas Olp  forte has been working fairly well for her and she seems to tolerate it.  She has GI related blood loss from AVMs.   We will continue with observation of her blood counts and iron levels on a 6 week basis.I notified her to call the clinic if she develops any symptoms of her iron deficiency. Otherwise she is doing well.  She will continue with B12 IM. Folic acid level should be checked again in the future given her prior history of folate deficiency.   I encouraged the patient to have a yearly mammograms.  Follow up with patient in 6 months.   Orders Placed This Encounter  Procedures  . Vitamin B12    Standing Status:   Future    Standing Expiration Date:   01/13/2017  . Folate    Standing Status:   Future    Standing Expiration Date:   01/13/2017  . SCHEDULING COMMUNICATION INJECTION    Schedule 15 minute injection appointment    All questions were answered. The patient knows to call the clinic with any problems, questions or concerns. We can certainly see the patient much sooner if necessary.  This document serves as a record of services personally performed by Ancil Linsey, MD. It was created on her behalf by Elmyra Ricks, a trained medical scribe. The creation of this record is based on the scribe's personal observations and the provider's statements to them. This document has been checked and approved by the attending provider.  I have reviewed the above documentation for accuracy and completeness, and I agree with the above.  This note was electronically signed.  Ancil Linsey, MD

## 2016-01-14 NOTE — Progress Notes (Signed)
Deanna Thompson presents today for injection per MD orders. B12 1000mcg administered IM in right Upper Arm. Administration without incident. Patient tolerated well.  

## 2016-01-17 ENCOUNTER — Other Ambulatory Visit (HOSPITAL_COMMUNITY): Payer: Self-pay

## 2016-01-21 ENCOUNTER — Encounter (HOSPITAL_COMMUNITY)
Admission: RE | Disposition: A | Discharge status: Home / Self Care | Payer: Self-pay | Source: Ambulatory Visit | Attending: Gastroenterology

## 2016-01-21 ENCOUNTER — Ambulatory Visit (HOSPITAL_COMMUNITY)
Admission: RE | Admit: 2016-01-21 | Discharge: 2016-01-21 | Disposition: A | Discharge status: Home / Self Care | Payer: BLUE CROSS/BLUE SHIELD | Source: Ambulatory Visit | Attending: Gastroenterology | Admitting: Gastroenterology

## 2016-01-21 ENCOUNTER — Encounter (HOSPITAL_COMMUNITY): Payer: Self-pay

## 2016-01-21 DIAGNOSIS — K921 Melena: Secondary | ICD-10-CM

## 2016-01-21 DIAGNOSIS — K625 Hemorrhage of anus and rectum: Secondary | ICD-10-CM

## 2016-01-21 DIAGNOSIS — L4050 Arthropathic psoriasis, unspecified: Secondary | ICD-10-CM | POA: Insufficient documentation

## 2016-01-21 DIAGNOSIS — M4690 Unspecified inflammatory spondylopathy, site unspecified: Secondary | ICD-10-CM | POA: Diagnosis not present

## 2016-01-21 DIAGNOSIS — K6289 Other specified diseases of anus and rectum: Secondary | ICD-10-CM | POA: Diagnosis not present

## 2016-01-21 DIAGNOSIS — K621 Rectal polyp: Secondary | ICD-10-CM | POA: Diagnosis not present

## 2016-01-21 DIAGNOSIS — K626 Ulcer of anus and rectum: Secondary | ICD-10-CM | POA: Insufficient documentation

## 2016-01-21 DIAGNOSIS — K219 Gastro-esophageal reflux disease without esophagitis: Secondary | ICD-10-CM | POA: Diagnosis not present

## 2016-01-21 DIAGNOSIS — K644 Residual hemorrhoidal skin tags: Secondary | ICD-10-CM | POA: Diagnosis not present

## 2016-01-21 DIAGNOSIS — K648 Other hemorrhoids: Secondary | ICD-10-CM | POA: Insufficient documentation

## 2016-01-21 HISTORY — PX: HEMORRHOID BANDING: SHX5850

## 2016-01-21 HISTORY — PX: FLEXIBLE SIGMOIDOSCOPY: SHX5431

## 2016-01-21 SURGERY — SIGMOIDOSCOPY, FLEXIBLE
Anesthesia: Moderate Sedation

## 2016-01-21 MED ORDER — MIDAZOLAM HCL 5 MG/5ML IJ SOLN
INTRAMUSCULAR | Status: DC | PRN
  Administered 2016-01-21 (×2): 2 mg via INTRAVENOUS

## 2016-01-21 MED ORDER — MEPERIDINE HCL 100 MG/ML IJ SOLN
INTRAMUSCULAR | Status: DC | PRN
  Administered 2016-01-21: 25 mg via INTRAVENOUS

## 2016-01-21 MED ORDER — STERILE WATER FOR IRRIGATION IR SOLN
Status: DC | PRN
  Administered 2016-01-21: 100 mL

## 2016-01-21 MED ORDER — MIDAZOLAM HCL 5 MG/5ML IJ SOLN
INTRAMUSCULAR | Status: AC
  Filled 2016-01-21: qty 10

## 2016-01-21 MED ORDER — MEPERIDINE HCL 100 MG/ML IJ SOLN
INTRAMUSCULAR | Status: DC
Start: 2016-01-21 — End: 2016-01-21
  Filled 2016-01-21: qty 2

## 2016-01-21 MED ORDER — SODIUM CHLORIDE 0.9 % IV SOLN
INTRAVENOUS | Status: DC
Start: 2016-01-21 — End: 2016-01-21
  Administered 2016-01-21: 13:00:00 via INTRAVENOUS

## 2016-01-21 NOTE — Interval H&P Note (Signed)
History and Physical Interval Note:  01/21/2016 1:13 PM  [place]  has presented today for surgery, with the diagnosis of rectal bleeding  The various methods of treatment have been discussed with the patient and family. After consideration of risks, benefits and other options for treatment, the patient has consented to  Procedure(s) with comments: FLEXIBLE SIGMOIDOSCOPY (N/A) - [place] (N/A) as a surgical intervention .  The patient's history has been reviewed, patient examined, no change in status, stable for surgery.  I have reviewed the patient's chart and labs.  Questions were answered to the patient's satisfaction.     [employer]

## 2016-01-21 NOTE — Op Note (Addendum)
[facility] Patient Name: [facility] Procedure Date: 01/21/2016 1:59 PM MRN: [id number] Date of Birth: [birth year] Attending MD: Barney Drain , MD CSN: [id number] Age: 60 Admit Type: Outpatient Procedure:                Flexible Sigmoidoscopy WITH COLD FORCEPS BIOPSY Indications:              Hematochezia Providers:                Barney Drain, MD Referring MD:             Rosemary Holms, MD Medicines:                Meperidine 25 mg IV, Midazolam 4 mg IV Complications:            No immediate complications. Estimated Blood Loss:     Estimated blood loss was minimal. Procedure:                Pre-Anesthesia Assessment:                           - Prior to the procedure, a History and Physical                            was performed, and patient medications and                            allergies were reviewed. The patient's tolerance of                            previous anesthesia was also reviewed. The risks                            and benefits of the procedure and the sedation                            options and risks were discussed with the patient.                            All questions were answered, and informed consent                            was obtained. Prior Anticoagulants: The patient has                            taken no previous anticoagulant or antiplatelet                            agents. ASA Grade Assessment: II - A patient with                            mild systemic disease. After reviewing the risks                            and benefits, the patient was deemed in  satisfactory condition to undergo the procedure.                            After obtaining informed consent, the scope was                            passed under direct vision. The [id number]                            scope was introduced through the anus and advanced                            to the the sigmoid colon. The flexible                           sigmoidoscopy was accomplished without difficulty.                            The patient tolerated the procedure well. The                            quality of the bowel preparation was good. Findings:      The digital rectal exam findings include non-thrombosed internal       hemorrhoids.      Diffuse moderate inflammation characterized by congestion (edema),       erosions, erythema, mucus and pseudopolyps was found in the rectum.       Biopsies were taken with a cold forceps for histology.      The exam OF TEH SIGMOID COLON was otherwise without abnormality.      Non-bleeding internal hemorrhoids were found. The hemorrhoids were       moderate.      Non-bleeding external hemorrhoids were found. The hemorrhoids were small. Impression:               - Non-thrombosed internal hemorrhoids found on                            digital rectal exam.                           - RECTAL BLEEDING DUE TO PROCTITIS AND POSSIBLY                            INTERNAL HEMORRHOIDS                           - The examination was otherwise normal. Moderate Sedation:      Moderate (conscious) sedation was administered by the endoscopy nurse       and supervised by the endoscopist. The following parameters were       monitored: oxygen saturation, heart rate, blood pressure, and response       to care. Total physician intraservice time was 24 minutes. Recommendation:           - Continue present medications.                           -  Resume previous diet.                           - Return to my office in 3 months.                           - Await pathology results.                           - Patient has a contact number available for                            emergencies. The signs and symptoms of potential                            delayed complications were discussed with the                            patient. Return to normal activities tomorrow.                             Written discharge instructions were provided to the                            patient. Procedure Code(s):        --- Professional ---                           [phone], Sigmoidoscopy, flexible; with biopsy, single                            or multiple                           99152, Moderate sedation services provided by the                            same physician or other qualified health care                            professional performing the diagnostic or                            therapeutic service that the sedation supports,                            requiring the presence of an independent trained                            observer to assist in the monitoring of the                            patient's level of consciousness and physiological                            status; initial  15 minutes of intraservice time,                            patient age 74 years or older                           [phone], Moderate sedation services; each additional                            15 minutes intraservice time Diagnosis Code(s):        --- Professional ---                           K62.89, Other specified diseases of anus and rectum                           K64.8, Other hemorrhoids CPT copyright 2016 American [employer] Association. All rights reserved. The codes documented in this report are preliminary and upon coder review may  be revised to meet current compliance requirements. Barney Drain, MD Barney Drain, MD 01/21/2016 2:13:08 PM This report has been signed electronically. Number of Addenda: 0

## 2016-01-21 NOTE — H&P (View-Only) (Signed)
Subjective:    Patient ID: Deanna Thompson, female    DOB: [birth year], 60 y.o.   MRN: [id number] Deanna Hillier, MD   HPI HAS A HEMORRHOID. 2-3 WEEKS AGO HAD HEMORRHOID PROBLEM. IT'S BLEEDING BUT IT'S WEIRD. TINY PIECE OF TISSUES BUT WEIRD. FEEL LIKE SOMETIME SHE IS STARTING HER CYCLE. USING PREP H AND RIGHT NOW 80% GONE. SINCE INITIAL FLARE MAKES HER FEEL LIKE SHE HAS [place] TO THE BATHROOM MORE OFTEN. WHEN SHE GOES TO THE BATHROOM FEELS LIKE SHE'S TRYING TO CRAP AROUND A ROCK. MORE REFLUX AND TRYING TO CONTROL WITH RANITIDINE ONCE A DAY. BELCHES ALL THE TIME.    PT DENIES FEVER, CHILLS, HEMATEMESIS, nausea, vomiting, melena, diarrhea, CHEST PAIN, SHORTNESS OF BREATH,  CHANGE IN BOWEL IN HABITS, constipation, abdominal pain, problems swallowing, problems with sedation, heartburn or indigestion.   Past Medical History:  Diagnosis Date  . Abdominal hernia   . Abscess of pulp of tooth 12/12/14   left side  . Anxiety   . Arthritis    back  and down flank  right side more than left  . B12 deficiency 06/12/2015  . Brain aneurysm    METAL COIL PREVENTS MRI IMAGING and CLIP  . Cirrhosis ([place])    Presumably alcoholic cirrhosis  . Complication of anesthesia   . Depression   . ETOH abuse    Quit in 07/2011   . Fatigue    due to Sjogren's disease  . GERD (gastroesophageal reflux disease)   . HTN (hypertension)    no longer medicated, dated on 04/25/12  . IBS (irritable bowel syndrome)   . Iron deficiency anemia due to chronic blood loss   . Myofascial pain   . Panic attack   . PONV (postoperative nausea and vomiting)   . Psoriatic arthritis ([place])   . Right inguinal hernia 08/16/2012  . Seizures ([place]) 1997   1 seizure from coil insertion but no more seizures and no meds for seizures  . Sjogren's disease ([place])   . [place] HEAD 02/03/2007   Qualifier: Diagnosis of  By: Aline Brochure MD, Dorothyann Peng    . 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([place])     Past Surgical History:    Procedure Laterality Date  . AGILE CAPSULE N/A 06/21/2014   Procedure: AGILE CAPSULE;  Surgeon: Danie Binder, MD;  Location: [facility];  Service: Endoscopy;  Laterality: N/A;  0700  . BIOPSY  10/13/2011   SLF: mild gastritis/duodenal polypoid lesion   . CEREBRAL ANEURYSM REPAIR  1997  . COLONOSCOPY  10/13/2011   [id number] hemorrhoids/varices rectal/lesion in ascending colon(HYPERPLASTIC POLYP)  . COLONOSCOPY N/A 01/17/2013   SLF: 1. Internal hemorrhoids 2. Ascending colon polyps 3.  Rectal varices.   . ESOPHAGOGASTRODUODENOSCOPY N/A 06/29/2014   SLF: 1. anemia due to polypoid lesions in teh antrum, and duodenal polyps 2. single duodenum AVM  . GIVENS CAPSULE STUDY N/A 06/29/2014   Procedure: GIVENS CAPSULE STUDY;  Surgeon: Danie Binder, MD;  [facility]: [facility];  Service: Endoscopy;  Laterality: N/A;  . GIVENS CAPSULE STUDY N/A 07/13/2014   Procedure: GIVENS CAPSULE STUDY;  Surgeon: Danie Binder, MD;  [facility];  Service: Endoscopy;  Laterality: N/A;  700  . INGUINAL HERNIA REPAIR Right 08/28/2013   Procedure: RIGHT NGUINAL HERNIORRHAPHY WITH MESH;  Surgeon: Jamesetta So, MD;  Location: [facility];  Service: General;  Laterality: Right;  . INSERTION OF MESH N/A 06/09/2013   Procedure: INSERTION OF MESH;  Surgeon: Jamesetta So, MD;  Location: [facility];  Service: General;  Laterality: N/A;  . INSERTION OF MESH Right 08/28/2013   Procedure: INSERTION OF MESH;  Surgeon: Jamesetta So, MD;  Location: [facility];  Service: General;  Laterality: Right;  . PARACENTESIS  Feb 2014   10.7 liters  . TOOTH EXTRACTION Left 12/12/14  . UMBILICAL HERNIA REPAIR N/A 06/09/2013   Procedure: UMBILICAL HERNIORRHAPHY WITH MESH;  Surgeon: Jamesetta So, MD;  Location: [facility];  Service: General;  Laterality: N/A;    Allergies  Allergen Reactions  . Omeprazole Shortness Of Breath  . Other Shortness Of Breath and Rash    Red Meat Causes shortness of breath, rash and flu like symptoms.   .  Phenytoin Rash  . Keflex [Cephalexin] Other (See Comments)    Severe yeast infection  . Phenergan [Promethazine Hcl] Other (See Comments)    Not in right state of mind, HALLUCINATION     Review of Systems PER HPI OTHERWISE ALL SYSTEMS ARE NEGATIVE.    Objective:   Physical Exam  Constitutional: She is oriented to person, place, and time. She appears well-developed and well-nourished. No distress.  HENT:  Head: Normocephalic and atraumatic.  Mouth/Throat: Oropharynx is clear and moist. No oropharyngeal exudate.  Eyes: Pupils are equal, round, and reactive to light. No scleral icterus.  Neck: Normal range of motion. Neck supple.  Cardiovascular: Normal rate, regular rhythm and normal heart sounds.   Pulmonary/Chest: Effort normal and breath sounds normal. No respiratory distress.  Abdominal: Soft. Bowel sounds are normal. She exhibits no distension. There is no tenderness.  Genitourinary: Rectal exam shows internal hemorrhoid. Rectal exam shows no external hemorrhoid and no mass.     Genitourinary Comments: hemorrhoid  Musculoskeletal: She exhibits no edema.  Lymphadenopathy:    She has no cervical adenopathy.  Neurological: She is alert and oriented to person, place, and time.  Psychiatric: She has a normal mood and affect.  Vitals reviewed.     Assessment & Plan:

## 2016-01-21 NOTE — Discharge Instructions (Signed)
You HAVE PROCTITIS, INFLAMMATION OF THE RECTUM. YOUR SIGMOID COLON IS NORMAL. You have A LARGE HIATAL HERNIA. I biopsied your RECTUM.   DRINK WATER TO KEEP YOUR URINE LIGHT YELLOW.  FOLLOW A HIGH FIBER  DIET. AVOID ITEMS THAT CAUSE BLOATING. SEE INFO BELOW.  YOUR BIOPSY RESULTS WILL BE AVAILABLE IN MY CHART AFTER  OCT 27 AND MY OFFICE WILL CONTACT YOU IN 10-14 DAYS WITH YOUR RESULTS.   FOLLOW UP IN 3 MOS.     ENDOSCOPY Care After Read the instructions outlined below and refer to this sheet in the next week. These discharge instructions provide you with general information on caring for yourself after you leave the hospital. While your treatment has been planned according to the most current medical practices available, unavoidable complications occasionally occur. If you have any problems or questions after discharge, call DR. Arvid Marengo, [phone].  ACTIVITY  You may resume your regular activity, but move at a slower pace for the next 24 hours.   Take frequent rest periods for the next 24 hours.   Walking will help get rid of the air and reduce the bloated feeling in your belly (abdomen).   No driving for 24 hours (because of the medicine (anesthesia) used during the test).   You may shower.   Do not sign any important legal documents or operate any machinery for 24 hours (because of the anesthesia used during the test).    NUTRITION  Drink plenty of fluids.   You may resume your normal diet as instructed by your doctor.   Begin with a light meal and progress to your normal diet. Heavy or fried foods are harder to digest and may make you feel sick to your stomach (nauseated).   Avoid alcoholic beverages for 24 hours or as instructed.    MEDICATIONS  You may resume your normal medications.   WHAT YOU CAN EXPECT TODAY  Some feelings of bloating in the abdomen.   Passage of more gas than usual.   Spotting of blood in your stool or on the toilet paper  .  IF YOU  HAD POLYPS REMOVED DURING THE ENDOSCOPY:  Eat a soft diet IF YOU HAVE NAUSEA, BLOATING, ABDOMINAL PAIN, OR VOMITING.    FINDING OUT THE RESULTS OF YOUR TEST Not all test results are available during your visit. DR. Oneida Alar WILL CALL YOU WITHIN 14 DAYS OF YOUR PROCEDUE WITH YOUR RESULTS. Do not assume everything is normal if you have not heard from DR. Lyne Khurana, CALL HER OFFICE AT [phone].  SEEK IMMEDIATE MEDICAL ATTENTION AND CALL THE OFFICE: [phone] IF:  You have more than a spotting of blood in your stool.   Your belly is swollen (abdominal distention).   You are nauseated or vomiting.   You have a temperature over 101F.    High-Fiber Diet A high-fiber diet changes your normal diet to include more whole grains, legumes, fruits, and vegetables. Changes in the diet involve replacing refined carbohydrates with unrefined foods. The calorie level of the diet is essentially unchanged. The Dietary Reference Intake (recommended amount) for adult males is 38 grams per day. For adult females, it is 25 grams per day. Pregnant and lactating women should consume 28 grams of fiber per day. Fiber is the intact part of a plant that is not broken down during digestion. Functional fiber is fiber that has been isolated from the plant to provide a beneficial effect in the body. PURPOSE  Increase stool bulk.   Ease and regulate bowel  movements.   Lower cholesterol.   REDUCE RISK OF COLON CANCER  INDICATIONS THAT YOU NEED MORE FIBER  Constipation and hemorrhoids.   Uncomplicated diverticulosis (intestine condition) and irritable bowel syndrome.   Weight management.   As a protective measure against hardening of the arteries (atherosclerosis), diabetes, and cancer.   GUIDELINES FOR INCREASING FIBER IN THE DIET  Start adding fiber to the diet slowly. A gradual increase of about 5 more grams (2 slices of whole-wheat bread, 2 servings of most fruits or vegetables, or 1 bowl of high-fiber  cereal) per day is best. Too rapid an increase in fiber may result in constipation, flatulence, and bloating.   Drink enough water and fluids to keep your urine clear or pale yellow. Water, juice, or caffeine-free drinks are recommended. Not drinking enough fluid may cause constipation.   Eat a variety of high-fiber foods rather than one type of fiber.   Try to increase your intake of fiber through using high-fiber foods rather than fiber pills or supplements that contain small amounts of fiber.   The goal is to change the types of food eaten. Do not supplement your present diet with high-fiber foods, but replace foods in your present diet.   INCLUDE A VARIETY OF FIBER SOURCES  Replace refined and processed grains with whole grains, canned fruits with fresh fruits, and incorporate other fiber sources. White rice, white breads, and most bakery goods contain little or no fiber.   Brown whole-grain rice, buckwheat oats, and many fruits and vegetables are all good sources of fiber. These include: broccoli, Brussels sprouts, cabbage, cauliflower, beets, sweet potatoes, white potatoes (skin on), carrots, tomatoes, eggplant, squash, berries, fresh fruits, and dried fruits.   Cereals appear to be the richest source of fiber. Cereal fiber is found in whole grains and bran. Bran is the fiber-rich outer coat of cereal grain, which is largely removed in refining. In whole-grain cereals, the bran remains. In breakfast cereals, the largest amount of fiber is found in those with "bran" in their names. The fiber content is sometimes indicated on the label.   You may need to include additional fruits and vegetables each day.   In baking, for 1 cup white flour, you may use the following substitutions:   1 cup whole-wheat flour minus 2 tablespoons.   1/2 cup white flour plus 1/2 cup whole-wheat flour.    PROCTITIS Proctitis is an inflammation of the lining of the rectum. The rectum is a muscular tube that's  connected to the end of your colon. Stool passes through the rectum on its way out of the body. Proctitis can cause rectal pain and the continuous sensation that you need to have a bowel movement. Proctitis symptoms can be short-lived, or they can become chronic.  Proctitis signs and symptoms may include: A frequent or continuous feeling that you need to have a bowel movement  Rectal bleeding  The passing of mucus through your rectum  Rectal pain  Pain on the left side of your abdomen  A feeling of fullness in your rectum  Diarrhea  Pain with bowel movements

## 2016-01-27 ENCOUNTER — Telehealth: Payer: Self-pay

## 2016-01-27 ENCOUNTER — Encounter (HOSPITAL_COMMUNITY): Payer: Self-pay | Admitting: Gastroenterology

## 2016-01-27 NOTE — Telephone Encounter (Signed)
Pt called and said she had not seen anything in [place] about her results from biopsies.  I told her I would let Dr. Oneida Alar know.

## 2016-01-28 NOTE — Telephone Encounter (Signed)
Left the full message on VM for pt.  

## 2016-01-28 NOTE — Telephone Encounter (Signed)
PLEASE CALL PT. RESULTS I MYCHART. PATH NONDIAGNOSTIC. NEED TO DISCUSS WITH PATHOLOGIST BUT SHE'S ON VACATION. PT CAN CALL ME AFTER 2 PM ON NOV 1.

## 2016-01-29 ENCOUNTER — Ambulatory Visit: Payer: BLUE CROSS/BLUE SHIELD | Admitting: Gastroenterology

## 2016-02-03 ENCOUNTER — Telehealth: Payer: Self-pay | Admitting: Gastroenterology

## 2016-02-03 NOTE — Telephone Encounter (Signed)
Recall for ultrasound 

## 2016-02-03 NOTE — Telephone Encounter (Signed)
Pt called to speak with DS, but I told her that DS is out of office until Thursday. Patient asked if SF would call her at [phone].

## 2016-02-03 NOTE — Telephone Encounter (Signed)
Has the feeling to have a bowel movements. Pt is having very bad gas and mucous bowel movement but not mixed into the bowel movement.  She stated that is started Sunday.Just like before she is wanting to know what she can do.

## 2016-02-04 MED ORDER — MESALAMINE 4 G RE ENEM: 4.0000 g | ENEMA | Freq: Every day | RECTAL | 1 refills | Status: DC

## 2016-02-04 NOTE — Telephone Encounter (Signed)
Letter mailed

## 2016-02-04 NOTE — Addendum Note (Signed)
Addended by: Danie Binder on: 02/04/2016 04:21 PM   Modules accepted: Orders

## 2016-02-04 NOTE — Telephone Encounter (Addendum)
Called patient TO DISCUSS CONCERNS. REVIEWED PATH WITH PT-PROCTITIS UNCLEAR ETIOLOGY. Having trouble with passing stool. Has normal stool but may be mucous and brbpr WHEN SHE SITS DOWN HAS GROSS AND SMELLY FARTS. NO REAL CHANGE IN DIET. NO RECTAL URGENCY. SEES WITH EVERY BM AND LASTS 2 WEEKS BEFORE [place] WAS [place] 2 DAYS AGO IT CAME MAY HAVE URGENCY TO GO TO BATHROOM. FEELS LIKE FIRST DAY OF A CYCLE: SWOLLEN AND IT HURTS DOWN LOW IN HER BUTT HOLE. AFTER BM DOESN'T FEEL THINGS DROP OF. NO RADIATION TO PELVIS. NO IMMUNOSUPPRESSANT MEDS IN 2017.  [place]AN FOR [place] [facility] 2 WEEKS[place] PT WILL CALL IN 2 WEEKS WITH AN UPDATE.

## 2016-02-06 NOTE — Telephone Encounter (Signed)
Noted  

## 2016-02-08 ENCOUNTER — Encounter (HOSPITAL_COMMUNITY): Payer: Self-pay | Admitting: Hematology & Oncology

## 2016-02-25 ENCOUNTER — Encounter (HOSPITAL_COMMUNITY): Payer: BLUE CROSS/BLUE SHIELD

## 2016-02-25 ENCOUNTER — Encounter (HOSPITAL_COMMUNITY): Payer: BLUE CROSS/BLUE SHIELD | Attending: Hematology & Oncology

## 2016-02-25 VITALS — BP 134/76 | HR 84 | Temp 98.4°F | Resp 18

## 2016-02-25 DIAGNOSIS — D500 Iron deficiency anemia secondary to blood loss (chronic): Secondary | ICD-10-CM | POA: Insufficient documentation

## 2016-02-25 DIAGNOSIS — E538 Deficiency of other specified B group vitamins: Secondary | ICD-10-CM

## 2016-02-25 LAB — CBC WITH DIFFERENTIAL/PLATELET
Basophils Absolute: 0 10*3/uL (ref 0.0–0.1)
Basophils Relative: 0 %
EOS ABS: 0.2 10*3/uL (ref 0.0–0.7)
Eosinophils Relative: 3 %
HEMATOCRIT: 42.1 % (ref 36.0–46.0)
HEMOGLOBIN: 14.2 g/dL (ref 12.0–15.0)
LYMPHS ABS: 1.2 10*3/uL (ref 0.7–4.0)
LYMPHS PCT: 16 %
MCH: 31.5 pg (ref 26.0–34.0)
MCHC: 33.7 g/dL (ref 30.0–36.0)
MCV: 93.3 fL (ref 78.0–100.0)
MONOS PCT: 8 %
Monocytes Absolute: 0.6 10*3/uL (ref 0.1–1.0)
NEUTROS PCT: 73 %
Neutro Abs: 5.6 10*3/uL (ref 1.7–7.7)
Platelets: 220 10*3/uL (ref 150–400)
RBC: 4.51 MIL/uL (ref 3.87–5.11)
RDW: 13.9 % (ref 11.5–15.5)
WBC: 7.7 10*3/uL (ref 4.0–10.5)

## 2016-02-25 LAB — COMPREHENSIVE METABOLIC PANEL
ALK PHOS: 71 U/L (ref 38–126)
ALT: 19 U/L (ref 14–54)
ANION GAP: 9 (ref 5–15)
AST: 27 U/L (ref 15–41)
Albumin: 4 g/dL (ref 3.5–5.0)
BILIRUBIN TOTAL: 0.6 mg/dL (ref 0.3–1.2)
BUN: 17 mg/dL (ref 6–20)
CALCIUM: 9.4 mg/dL (ref 8.9–10.3)
CO2: 25 mmol/L (ref 22–32)
CREATININE: 1.17 mg/dL — AB (ref 0.44–1.00)
Chloride: 102 mmol/L (ref 101–111)
GFR, EST AFRICAN AMERICAN: 58 mL/min — AB (ref >60)
GFR, EST NON AFRICAN AMERICAN: 50 mL/min — AB (ref >60)
Glucose, Bld: 115 mg/dL — ABNORMAL HIGH (ref 65–99)
Potassium: 3.9 mmol/L (ref 3.5–5.1)
SODIUM: 136 mmol/L (ref 135–145)
TOTAL PROTEIN: 7.7 g/dL (ref 6.5–8.1)

## 2016-02-25 LAB — FERRITIN: Ferritin: 71 ng/mL (ref 11–307)

## 2016-02-25 LAB — IRON AND TIBC
IRON: 81 ug/dL (ref 28–170)
SATURATION RATIOS: 20 % (ref 10.4–31.8)
TIBC: 400 ug/dL (ref 250–450)
UIBC: 319 ug/dL

## 2016-02-25 MED ORDER — CYANOCOBALAMIN 1000 MCG/ML IJ SOLN
INTRAMUSCULAR | Status: AC
  Filled 2016-02-25: qty 1

## 2016-02-25 MED ORDER — CYANOCOBALAMIN 1000 MCG/ML IJ SOLN
1000.0000 ug | Freq: Once | INTRAMUSCULAR | Status: AC
  Administered 2016-02-25: 1000 ug via INTRAMUSCULAR

## 2016-02-25 NOTE — Progress Notes (Signed)
Quanesha Fabiano presents today for injection per MD orders. B12 1000mcg administered IM in right Upper Arm. Administration without incident. Patient tolerated well.  

## 2016-02-26 ENCOUNTER — Other Ambulatory Visit: Payer: Self-pay | Admitting: Gastroenterology

## 2016-04-01 ENCOUNTER — Encounter: Payer: Self-pay | Admitting: Gastroenterology

## 2016-04-01 ENCOUNTER — Ambulatory Visit (INDEPENDENT_AMBULATORY_CARE_PROVIDER_SITE_OTHER): Payer: BLUE CROSS/BLUE SHIELD | Admitting: Gastroenterology

## 2016-04-01 DIAGNOSIS — K7030 Alcoholic cirrhosis of liver without ascites: Secondary | ICD-10-CM

## 2016-04-01 DIAGNOSIS — K5901 Slow transit constipation: Secondary | ICD-10-CM

## 2016-04-01 DIAGNOSIS — K6289 Other specified diseases of anus and rectum: Secondary | ICD-10-CM | POA: Insufficient documentation

## 2016-04-01 DIAGNOSIS — K5900 Constipation, unspecified: Secondary | ICD-10-CM | POA: Insufficient documentation

## 2016-04-01 MED ORDER — LINACLOTIDE 72 MCG PO CAPS: 72.0000 ug | ORAL_CAPSULE | Freq: Every day | ORAL | 3 refills | Status: DC

## 2016-04-01 MED ORDER — AMITRIPTYLINE HCL 10 MG PO TABS: ORAL_TABLET | ORAL | 3 refills | Status: DC

## 2016-04-01 MED ORDER — LINACLOTIDE 72 MCG PO CAPS: 72.0000 ug | ORAL_CAPSULE | Freq: Every day | ORAL | 11 refills | Status: DC

## 2016-04-01 MED ORDER — RANITIDINE HCL 150 MG PO TABS: 150.0000 mg | ORAL_TABLET | Freq: Two times a day (BID) | ORAL | 3 refills | Status: DC

## 2016-04-01 MED ORDER — BUMETANIDE 1 MG PO TABS: ORAL_TABLET | ORAL | 3 refills | Status: DC

## 2016-04-01 MED ORDER — SPIRONOLACTONE 100 MG PO TABS: ORAL_TABLET | ORAL | 3 refills | Status: DC

## 2016-04-01 NOTE — Addendum Note (Signed)
Addended by: Danie Binder on: 04/01/2016 10:51 AM   Modules accepted: Orders

## 2016-04-01 NOTE — Assessment & Plan Note (Signed)
WELL COMPENSATED DISEASE.  CONTINUE BUMEX AND ALDACTONE. COMPLETE ULTRASOUND.  FOLLOW UP IN 6 MOS.

## 2016-04-01 NOTE — Addendum Note (Signed)
Addended by: Danie Binder on: 04/01/2016 10:11 AM   Modules accepted: Orders

## 2016-04-01 NOTE — Assessment & Plan Note (Signed)
SYMPTOMS NOT IDEALLY CONTROLLED.   ADD LINZESS 30 MINS PRIOR TO BREAKFAST. IT MAY CAUSE EXPLOSIVE DIARRHEA. DRINK WATER TO KEEP YOUR URINE LIGHT YELLOW. FOLLOW A HIGH FIBER DIET. AVOID ITEMS THAT CAUSE BLOATING & GAS.   FOLLOW UP IN 6 MOS.

## 2016-04-01 NOTE — Patient Instructions (Addendum)
CONTINUE BUMEX AND ALDACTONE.  ADD LINZESS 30 MINS PRIOR TO BREAKFAST. IT MAY CAUSE EXPLOSIVE DIARRHEA.  DRINK WATER TO KEEP YOUR URINE LIGHT YELLOW.  FOLLOW A HIGH FIBER DIET. AVOID ITEMS THAT CAUSE BLOATING & GAS.    COMPLETE ULTRASOUND.  FOLLOW UP IN 6 MOS.

## 2016-04-01 NOTE — Progress Notes (Signed)
ON RECALL  °

## 2016-04-01 NOTE — Progress Notes (Signed)
cc'ed to pcp °

## 2016-04-01 NOTE — Progress Notes (Signed)
Subjective:    Patient ID: Deanna Thompson, female    DOB: 1955/09/06, 61 y.o.   MRN: [id number]  [name], MD  HPI TROUBLE WITH CONSTIPATION. HAVING GIANT TERDS AND HARD TO PASS. COLACE HELPS BUT NOT GETTING THE IDEAL RESULT. DID THE ENEMAS AND FIXED HER. NOT HAD A SINGLE PROBLEM SINCE DOING THE ENEMAS. SLEEPING 1A-7A DAILY WITH LEXAPRO AND ELAVIL. HAS PARTS OF 3 AUTOIMMUNE DISEASE BUT SYMPTOMS ARE MILD EXCEPT FOR FATIGUE. FARTS A LOT. RARE PROBLEMS WITH SWALLOWING ONE TIME A WEEK. FEELS FOOD GOING THE WRONG WAY AND SHE COUGHS. DOESN'T WANT TO SEE ANYONE ABOUT IT AT THIS TIME. SHORT TERM MEMORY AND HAS APHASIA. ALL THREE THING AFFECTED WITH HER ANEURYSM. HEARTBURN FAIRLY WELL CONTROLLED ON ZANTAC BUT NOT IDEALLY CONTROLLED.    PT DENIES FEVER, CHILLS, HEMATOCHEZIA, HEMATEMESIS, nausea, vomiting, melena, diarrhea, CHEST PAIN, SHORTNESS OF BREATH,  CHANGE IN BOWEL IN HABITS, OR abdominal pain.   Past Medical History:  Diagnosis Date  . Abdominal hernia   . Abscess of pulp of tooth 12/12/14   left side  . Anxiety   . Arthritis    back  and down flank  right side more than left  . B12 deficiency 06/12/2015  . Brain aneurysm    METAL COIL PREVENTS MRI IMAGING and CLIP  . Cirrhosis ([place])    Presumably alcoholic cirrhosis  . Complication of anesthesia   . Depression   . ETOH abuse    Quit in 07/2011   . Fatigue    due to Sjogren's disease  . GERD (gastroesophageal reflux disease)   . HTN (hypertension)    no longer medicated, dated on 04/25/12  . IBS (irritable bowel syndrome)   . Iron deficiency anemia due to chronic blood loss   . Myofascial pain   . Panic attack   . PONV (postoperative nausea and vomiting)   . Psoriatic arthritis ([place])   . Right inguinal hernia 08/16/2012  . Seizures ([place]) 1997   1 seizure from coil insertion but no more seizures and no meds for seizures  . Sjogren's disease ([place])   . [place] HEAD 02/03/2007   Qualifier: Diagnosis of  By:  Aline Brochure MD, Dorothyann Peng    . 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([place])    Past Surgical History:  Procedure Laterality Date  . AGILE CAPSULE N/A 06/21/2014   Procedure: AGILE CAPSULE;  Surgeon: Danie Binder, MD;  [facility];  Service: Endoscopy;  Laterality: N/A;  0700  . BIOPSY  10/13/2011   SLF: mild gastritis/duodenal polypoid lesion   . CEREBRAL ANEURYSM REPAIR  1997  . COLONOSCOPY  10/13/2011   [id number] hemorrhoids/varices rectal/lesion in ascending colon(HYPERPLASTIC POLYP)  . COLONOSCOPY N/A 01/17/2013   SLF: 1. Internal hemorrhoids 2. Ascending colon polyps 3.  Rectal varices.   . ESOPHAGOGASTRODUODENOSCOPY N/A 06/29/2014   SLF: 1. anemia due to polypoid lesions in teh antrum, and duodenal polyps 2. single duodenum AVM  . FLEXIBLE SIGMOIDOSCOPY N/A 01/21/2016   Procedure: FLEXIBLE SIGMOIDOSCOPY;  Surgeon: Danie Binder, MD;  [facility];  Service: Endoscopy;  Laterality: N/A;  245  . GIVENS CAPSULE STUDY N/A 06/29/2014   Procedure: GIVENS CAPSULE STUDY;  Surgeon: Danie Binder, MD;  [facility];  Service: Endoscopy;  Laterality: N/A;  . GIVENS CAPSULE STUDY N/A 07/13/2014   Procedure: GIVENS CAPSULE STUDY;  Surgeon: Danie Binder, MD;  [facility];  Service: Endoscopy;  Laterality: N/A;  700  . HEMORRHOID BANDING  N/A 01/21/2016   Procedure: HEMORRHOID BANDING;  Surgeon: Danie Binder, MD;  [facility];  Service: Endoscopy;  Laterality: N/A;  . INGUINAL HERNIA REPAIR Right 08/28/2013   Procedure: RIGHT NGUINAL HERNIORRHAPHY WITH MESH;  Surgeon: Jamesetta So, MD;  Location: [facility]S;  Service: General;  Laterality: Right;  . INSERTION OF MESH N/A 06/09/2013   Procedure: INSERTION OF MESH;  Surgeon: Jamesetta So, MD;  Location: [facility];  Service: General;  Laterality: N/A;  . INSERTION OF MESH Right 08/28/2013   Procedure: INSERTION OF MESH;  Surgeon: Jamesetta So, MD;  Location: [facility];  Service: General;  Laterality:  Right;  . PARACENTESIS  Feb 2014   10.7 liters  . TOOTH EXTRACTION Left 12/12/14  . UMBILICAL HERNIA REPAIR N/A 06/09/2013   Procedure: UMBILICAL HERNIORRHAPHY WITH MESH;  Surgeon: Jamesetta So, MD;  Location: [facility];  Service: General;  Laterality: N/A;   Allergies  Allergen Reactions  . Omeprazole Shortness Of Breath  . Other Shortness Of Breath and Rash    Red Meat Causes shortness of breath, rash and flu like symptoms.   . Phenytoin Rash  . Keflex [Cephalexin] Other (See Comments)    Severe yeast infection  . Phenergan [Promethazine Hcl] Other (See Comments)    Not in right state of mind, HALLUCINATION    Current Outpatient Prescriptions  Medication Sig Dispense Refill  . acetaminophen (TYLENOL) 500 MG tablet Take 1,000 mg by mouth every 6 (six) hours as needed for moderate pain.    Marland Kitchen amitriptyline (ELAVIL) 10 MG tablet TAKE (1) TABLET BY MOUTH AT BEDTIME.    . bumetanide (BUMEX) 1 MG tablet TAKE (1) TABLET BY MOUTH (2) TIMES DAILY.    Marland Kitchen buPROPion (WELLBUTRIN SR) 100 MG 12 hr tablet TAKE (1) TABLET BY MOUTH TWICE DAILY.    . carboxymethylcellulose (REFRESH PLUS) 0.5 % SOLN Place 2 drops into both eyes 3 (three) times daily as needed (Dry Eyes).     . cyanocobalamin (,VITAMIN B-12,) 1000 MCG/ML injection Inject 1 mL (1,000 mcg total) into the muscle once. (Patient taking differently: Inject 1,000 mcg into the muscle every 30 (thirty) days. )    . cycloSPORINE (RESTASIS) 0.05 % ophthalmic emulsion Place 1 drop into both eyes 2 (two) times daily.    Marland Kitchen docusate sodium (COLACE) 100 MG capsule Take 100 mg by mouth daily.    Marland Kitchen escitalopram (LEXAPRO) 20 MG tablet TAKE (1) TABLET BY MOUTH AT BEDTIME.    Marland Kitchen loratadine (CLARITIN) 10 MG tablet Take 10 mg by mouth daily as needed for allergies.     . ranitidine (ZANTAC) 150 MG tablet Take 150 mg by mouth daily.    . sodium chloride (OCEAN) 0.65 % nasal spray Place 1 spray into the nose as needed for congestion.    Marland Kitchen spironolactone (ALDACTONE)  100 MG tablet TAKE (1) TABLET BY MOUTH TWICE DAILY.    .      .      .       .      .       Review of Systems PER HPI OTHERWISE ALL SYSTEMS ARE NEGATIVE.    Objective:   Physical Exam  Constitutional: She is oriented to person, place, and time. She appears well-developed and well-nourished. No distress.  HENT:  Head: Normocephalic and atraumatic.  Mouth/Throat: Oropharynx is clear and moist. No oropharyngeal exudate.  Eyes: Pupils are equal, round, and reactive to light. No scleral icterus.  Neck:  Normal range of motion. Neck supple.  Cardiovascular: Normal rate, regular rhythm and normal heart sounds.   Pulmonary/Chest: Effort normal and breath sounds normal. No respiratory distress.  Abdominal: Soft. Bowel sounds are normal. She exhibits no distension. There is no tenderness.  Musculoskeletal: She exhibits no edema.  Lymphadenopathy:    She has no cervical adenopathy.  Neurological: She is alert and oriented to person, place, and time.  NO  NEW FOCAL DEFICITS  Psychiatric: She has a normal mood and affect.  Vitals reviewed.     Assessment & Plan:

## 2016-04-01 NOTE — Assessment & Plan Note (Signed)
SYMPTOMS CONTROLLED/RESOLVED.  CONTINUE TO MONITOR SYMPTOMS. 

## 2016-04-03 ENCOUNTER — Ambulatory Visit (HOSPITAL_COMMUNITY)
Admission: RE | Admit: 2016-04-03 | Discharge: 2016-04-03 | Disposition: A | Discharge status: Home / Self Care | Payer: BLUE CROSS/BLUE SHIELD | Source: Ambulatory Visit | Attending: Gastroenterology | Admitting: Gastroenterology

## 2016-04-03 DIAGNOSIS — K8020 Calculus of gallbladder without cholecystitis without obstruction: Secondary | ICD-10-CM | POA: Insufficient documentation

## 2016-04-03 DIAGNOSIS — K7030 Alcoholic cirrhosis of liver without ascites: Secondary | ICD-10-CM | POA: Diagnosis present

## 2016-04-07 ENCOUNTER — Encounter (HOSPITAL_COMMUNITY): Payer: Self-pay

## 2016-04-07 ENCOUNTER — Encounter (HOSPITAL_COMMUNITY): Payer: BLUE CROSS/BLUE SHIELD | Attending: Hematology & Oncology

## 2016-04-07 VITALS — BP 129/81 | HR 94 | Temp 98.4°F | Resp 18

## 2016-04-07 DIAGNOSIS — E538 Deficiency of other specified B group vitamins: Secondary | ICD-10-CM

## 2016-04-07 DIAGNOSIS — D500 Iron deficiency anemia secondary to blood loss (chronic): Secondary | ICD-10-CM | POA: Diagnosis present

## 2016-04-07 LAB — COMPREHENSIVE METABOLIC PANEL
ALBUMIN: 4.4 g/dL (ref 3.5–5.0)
ALK PHOS: 78 U/L (ref 38–126)
ALT: 20 U/L (ref 14–54)
ANION GAP: 9 (ref 5–15)
AST: 26 U/L (ref 15–41)
BILIRUBIN TOTAL: 0.5 mg/dL (ref 0.3–1.2)
BUN: 17 mg/dL (ref 6–20)
CALCIUM: 9.8 mg/dL (ref 8.9–10.3)
CO2: 24 mmol/L (ref 22–32)
Chloride: 101 mmol/L (ref 101–111)
Creatinine, Ser: 1.2 mg/dL — ABNORMAL HIGH (ref 0.44–1.00)
GFR calc Af Amer: 56 mL/min — ABNORMAL LOW (ref >60)
GFR, EST NON AFRICAN AMERICAN: 48 mL/min — AB (ref >60)
Glucose, Bld: 171 mg/dL — ABNORMAL HIGH (ref 65–99)
Potassium: 3.9 mmol/L (ref 3.5–5.1)
Sodium: 134 mmol/L — ABNORMAL LOW (ref 135–145)
TOTAL PROTEIN: 8.4 g/dL — AB (ref 6.5–8.1)

## 2016-04-07 LAB — CBC WITH DIFFERENTIAL/PLATELET
Basophils Absolute: 0.1 10*3/uL (ref 0.0–0.1)
Basophils Relative: 1 %
Eosinophils Absolute: 0.2 10*3/uL (ref 0.0–0.7)
Eosinophils Relative: 3 %
HEMATOCRIT: 44.9 % (ref 36.0–46.0)
HEMOGLOBIN: 15 g/dL (ref 12.0–15.0)
LYMPHS ABS: 1.3 10*3/uL (ref 0.7–4.0)
LYMPHS PCT: 15 %
MCH: 30.7 pg (ref 26.0–34.0)
MCHC: 33.4 g/dL (ref 30.0–36.0)
MCV: 92 fL (ref 78.0–100.0)
MONO ABS: 0.5 10*3/uL (ref 0.1–1.0)
MONOS PCT: 7 %
NEUTROS ABS: 6.1 10*3/uL (ref 1.7–7.7)
NEUTROS PCT: 74 %
Platelets: 251 10*3/uL (ref 150–400)
RBC: 4.88 MIL/uL (ref 3.87–5.11)
RDW: 13.7 % (ref 11.5–15.5)
WBC: 8.1 10*3/uL (ref 4.0–10.5)

## 2016-04-07 LAB — IRON AND TIBC
IRON: 73 ug/dL (ref 28–170)
SATURATION RATIOS: 16 % (ref 10.4–31.8)
TIBC: 462 ug/dL — ABNORMAL HIGH (ref 250–450)
UIBC: 389 ug/dL

## 2016-04-07 LAB — FERRITIN: Ferritin: 52 ng/mL (ref 11–307)

## 2016-04-07 MED ORDER — CYANOCOBALAMIN 1000 MCG/ML IJ SOLN
INTRAMUSCULAR | Status: AC
  Filled 2016-04-07: qty 1

## 2016-04-07 MED ORDER — CYANOCOBALAMIN 1000 MCG/ML IJ SOLN
1000.0000 ug | Freq: Once | INTRAMUSCULAR | Status: AC
  Administered 2016-04-07: 1000 ug via INTRAMUSCULAR

## 2016-04-07 NOTE — Progress Notes (Signed)
Demetric Hopping presents today for injection per MD orders. B12 1000mcg administered IM in right Upper Arm. Administration without incident. Patient tolerated well.  

## 2016-04-07 NOTE — Patient Instructions (Signed)
[facility] at [facility] Discharge Instructions  RECOMMENDATIONS MADE BY THE CONSULTANT AND ANY TEST RESULTS WILL BE SENT TO YOUR REFERRING PHYSICIAN.  B12 today.    Thank you for choosing [facility] at [facility] to provide your oncology and hematology care.  To afford each patient quality time with our provider, please arrive at least 15 minutes before your scheduled appointment time.    If you have a lab appointment with the Cancer Center please come in thru the  Main Entrance and check in at the main information desk  You need to re-schedule your appointment should you arrive 10 or more minutes late.  We strive to give you quality time with our providers, and arriving late affects you and other patients whose appointments are after yours.  Also, if you no show three or more times for appointments you may be dismissed from the clinic at the providers discretion.     Again, thank you for choosing [facility].  Our hope is that these requests will decrease the amount of time that you wait before being seen by our physicians.       _____________________________________________________________  Should you have questions after your visit to [facility], please contact our office at [phone] between the hours of 8:30 a.m. and 4:30 p.m.  Voicemails left after 4:30 p.m. will not be returned until the following business day.  For prescription refill requests, have your pharmacy contact our office.       Resources For Cancer Patients and their Caregivers ? American Cancer Society: Can assist with transportation, wigs, general needs, runs Look Good Feel Better.        1-888-227-6333 ? Cancer Care: Provides financial assistance, online support groups, medication/co-pay assistance.  1-800-813-HOPE (4673) ? [facility] Assists [place] cancer patients and their families through emotional ,  educational and financial support.  336-427-4357 ? [place] DSS Where to apply for food stamps, Medicaid and utility assistance. 336-342-1394 ? RCATS: Transportation to medical appointments. 336-347-2287 ? Social Security Administration: May apply for disability if have a Stage IV cancer. 336-342-7796 1-800-772-1213 ? [place] Aging, Disability and Transit Services: Assists with nutrition, care and transit needs. 336-349-2343  Cancer Center Support Programs: @10RELATIVEDAYS@ > Cancer Support Group  2nd Tuesday of the month 1pm-2pm, Journey Room  > Creative Journey  3rd Tuesday of the month 1130am-1pm, Journey Room  > Look Good Feel Better  1st Wednesday of the month 10am-12 noon, Journey Room (Call American Cancer Society to register 1-800-395-5775)    

## 2016-04-13 ENCOUNTER — Other Ambulatory Visit: Payer: Self-pay | Admitting: Family Medicine

## 2016-04-24 ENCOUNTER — Other Ambulatory Visit: Payer: Self-pay | Admitting: Obstetrics & Gynecology

## 2016-04-24 DIAGNOSIS — Z1231 Encounter for screening mammogram for malignant neoplasm of breast: Secondary | ICD-10-CM

## 2016-04-29 ENCOUNTER — Ambulatory Visit (HOSPITAL_COMMUNITY)
Admission: RE | Admit: 2016-04-29 | Discharge: 2016-04-29 | Disposition: A | Discharge status: Home / Self Care | Payer: BLUE CROSS/BLUE SHIELD | Source: Ambulatory Visit | Attending: Obstetrics & Gynecology | Admitting: Obstetrics & Gynecology

## 2016-04-29 DIAGNOSIS — Z1231 Encounter for screening mammogram for malignant neoplasm of breast: Secondary | ICD-10-CM | POA: Insufficient documentation

## 2016-05-04 ENCOUNTER — Encounter: Payer: Self-pay | Admitting: Family Medicine

## 2016-05-04 ENCOUNTER — Other Ambulatory Visit: Payer: Self-pay | Admitting: Family Medicine

## 2016-05-04 ENCOUNTER — Ambulatory Visit (INDEPENDENT_AMBULATORY_CARE_PROVIDER_SITE_OTHER): Payer: BLUE CROSS/BLUE SHIELD | Admitting: Family Medicine

## 2016-05-04 VITALS — BP 136/84 | Ht 68.0 in | Wt 258.2 lb

## 2016-05-04 DIAGNOSIS — F331 Major depressive disorder, recurrent, moderate: Secondary | ICD-10-CM | POA: Diagnosis not present

## 2016-05-04 DIAGNOSIS — F5101 Primary insomnia: Secondary | ICD-10-CM | POA: Diagnosis not present

## 2016-05-04 DIAGNOSIS — G8929 Other chronic pain: Secondary | ICD-10-CM

## 2016-05-04 DIAGNOSIS — M545 Low back pain: Secondary | ICD-10-CM

## 2016-05-04 MED ORDER — BUPROPION HCL ER (SR) 100 MG PO TB12: ORAL_TABLET | ORAL | 5 refills | Status: DC

## 2016-05-04 MED ORDER — ESCITALOPRAM OXALATE 20 MG PO TABS: ORAL_TABLET | ORAL | 5 refills | Status: DC

## 2016-05-04 NOTE — Progress Notes (Signed)
    Subjective:    Patient ID: Deanna Thompson, female    DOB: 04/03/1955, 61 y.o.   MRN: [id number]  HPI Patient arrives for a follow up on depression. Overall mood reasonably decent  But now worse with neg thoughts re famiy illness Patient claims no suicidal thoughts but notes that her mood has been diminished lately. Patient goes into great length tenderness describing her symptoms in rather intense detail next   Sees  Therapist monthly pt has hx of etoh abuse and recently has felt more agitated   Pos hx o cirrhosis followed by g I ,  Flu shot givn already   Hx of myofaxcila pain in the face   Not a good exrciser  Walking some but nt a lot    Acupuncture helps  Can take tyl prn      Patient with c/o generalized back and hip pain.ongoing back pain, diffuse in nature, hx of tramadol , caused drowsiness in the short term. No longer using tramadol. Pain is a deep ache. Intermittent. Tends to flare when not exercising currently not exercising.  Quite a bit of stress with father-in-law sick and potentially dying of his difficulties   Review of Systems No headache, no major weight loss or weight gain, no chest pain no back pain abdominal pain no change in bowel habits complete ROS otherwise negative     Objective:   Physical Exam Alert vitals stable, NAD. Blood pressure good on repeat. HEENT normal. Lungs clear. Heart regular rate and rhythm. Mental status affect appropriate perhaps slightly subdued but no obvious depression. No hallucinations no dilution completely oriented engaging talkative       Assessment & Plan:  Impression depression with elements of anxiety somewhat worse with family stress of late. Patient went into great length on possible choices regarding interventions. She she states may want a higher dose of Wellbutrin and/or Lexapro 1 and point but not now. #2 chronic back pain discuss along with measures to diminished plan easily 25 minutes spent greater  than 50% in discussion follow-up in 6 months meds refilled

## 2016-05-05 ENCOUNTER — Encounter: Payer: Self-pay | Admitting: Gastroenterology

## 2016-05-19 ENCOUNTER — Encounter (HOSPITAL_COMMUNITY): Payer: Self-pay

## 2016-05-19 ENCOUNTER — Encounter (HOSPITAL_COMMUNITY): Payer: BLUE CROSS/BLUE SHIELD | Attending: Oncology

## 2016-05-19 DIAGNOSIS — E538 Deficiency of other specified B group vitamins: Secondary | ICD-10-CM

## 2016-05-19 DIAGNOSIS — D500 Iron deficiency anemia secondary to blood loss (chronic): Secondary | ICD-10-CM

## 2016-05-19 LAB — CBC WITH DIFFERENTIAL/PLATELET
Basophils Absolute: 0 10*3/uL (ref 0.0–0.1)
Basophils Relative: 1 %
EOS ABS: 0.2 10*3/uL (ref 0.0–0.7)
EOS PCT: 3 %
HCT: 43.8 % (ref 36.0–46.0)
HEMOGLOBIN: 14.6 g/dL (ref 12.0–15.0)
LYMPHS ABS: 1.4 10*3/uL (ref 0.7–4.0)
Lymphocytes Relative: 15 %
MCH: 30.6 pg (ref 26.0–34.0)
MCHC: 33.3 g/dL (ref 30.0–36.0)
MCV: 91.8 fL (ref 78.0–100.0)
MONOS PCT: 8 %
Monocytes Absolute: 0.7 10*3/uL (ref 0.1–1.0)
NEUTROS PCT: 73 %
Neutro Abs: 6.5 10*3/uL (ref 1.7–7.7)
Platelets: 248 10*3/uL (ref 150–400)
RBC: 4.77 MIL/uL (ref 3.87–5.11)
RDW: 13.6 % (ref 11.5–15.5)
WBC: 8.8 10*3/uL (ref 4.0–10.5)

## 2016-05-19 LAB — COMPREHENSIVE METABOLIC PANEL
ALK PHOS: 77 U/L (ref 38–126)
ALT: 19 U/L (ref 14–54)
ANION GAP: 8 (ref 5–15)
AST: 25 U/L (ref 15–41)
Albumin: 4.5 g/dL (ref 3.5–5.0)
BUN: 19 mg/dL (ref 6–20)
CALCIUM: 9.7 mg/dL (ref 8.9–10.3)
CO2: 27 mmol/L (ref 22–32)
Chloride: 100 mmol/L — ABNORMAL LOW (ref 101–111)
Creatinine, Ser: 1.21 mg/dL — ABNORMAL HIGH (ref 0.44–1.00)
GFR calc Af Amer: 56 mL/min — ABNORMAL LOW (ref >60)
GFR calc non Af Amer: 48 mL/min — ABNORMAL LOW (ref >60)
Glucose, Bld: 96 mg/dL (ref 65–99)
Potassium: 3.7 mmol/L (ref 3.5–5.1)
SODIUM: 135 mmol/L (ref 135–145)
TOTAL PROTEIN: 8.6 g/dL — AB (ref 6.5–8.1)
Total Bilirubin: 0.5 mg/dL (ref 0.3–1.2)

## 2016-05-19 LAB — IRON AND TIBC
IRON: 67 ug/dL (ref 28–170)
SATURATION RATIOS: 15 % (ref 10.4–31.8)
TIBC: 452 ug/dL — AB (ref 250–450)
UIBC: 385 ug/dL

## 2016-05-19 LAB — FERRITIN: Ferritin: 42 ng/mL (ref 11–307)

## 2016-05-19 MED ORDER — CYANOCOBALAMIN 1000 MCG/ML IJ SOLN
1000.0000 ug | Freq: Once | INTRAMUSCULAR | Status: AC
  Administered 2016-05-19: 1000 ug via INTRAMUSCULAR
  Filled 2016-05-19: qty 1

## 2016-05-19 NOTE — Patient Instructions (Signed)
[facility] at [facility] Discharge Instructions  RECOMMENDATIONS MADE BY THE CONSULTANT AND ANY TEST RESULTS WILL BE SENT TO YOUR REFERRING PHYSICIAN.  B12 injection today. Return as scheduled.   Thank you for choosing [facility] at [facility] to provide your oncology and hematology care.  To afford each patient quality time with our provider, please arrive at least 15 minutes before your scheduled appointment time.    If you have a lab appointment with the Cancer Center please come in thru the  Main Entrance and check in at the main information desk  You need to re-schedule your appointment should you arrive 10 or more minutes late.  We strive to give you quality time with our providers, and arriving late affects you and other patients whose appointments are after yours.  Also, if you no show three or more times for appointments you may be dismissed from the clinic at the providers discretion.     Again, thank you for choosing [facility].  Our hope is that these requests will decrease the amount of time that you wait before being seen by our physicians.       _____________________________________________________________  Should you have questions after your visit to [facility], please contact our office at [phone] between the hours of 8:30 a.m. and 4:30 p.m.  Voicemails left after 4:30 p.m. will not be returned until the following business day.  For prescription refill requests, have your pharmacy contact our office.       Resources For Cancer Patients and their Caregivers ? American Cancer Society: Can assist with transportation, wigs, general needs, runs Look Good Feel Better.        1-888-227-6333 ? Cancer Care: Provides financial assistance, online support groups, medication/co-pay assistance.  1-800-813-HOPE (4673) ? [facility] Assists [place] cancer patients and their  families through emotional , educational and financial support.  336-427-4357 ? [place] DSS Where to apply for food stamps, Medicaid and utility assistance. 336-342-1394 ? RCATS: Transportation to medical appointments. 336-347-2287 ? Social Security Administration: May apply for disability if have a Stage IV cancer. 336-342-7796 1-800-772-1213 ? [place] Aging, Disability and Transit Services: Assists with nutrition, care and transit needs. 336-349-2343  Cancer Center Support Programs: @10RELATIVEDAYS@ > Cancer Support Group  2nd Tuesday of the month 1pm-2pm, Journey Room  > Creative Journey  3rd Tuesday of the month 1130am-1pm, Journey Room  > Look Good Feel Better  1st Wednesday of the month 10am-12 noon, Journey Room (Call American Cancer Society to register 1-800-395-5775)   

## 2016-05-19 NOTE — Progress Notes (Signed)
Stearns presents today for injection per the provider's orders.  B12 administration without incident; see MAR for injection details.  Patient tolerated procedure well and without incident.  No questions or complaints noted at this time.

## 2016-05-20 ENCOUNTER — Other Ambulatory Visit (HOSPITAL_COMMUNITY): Payer: Self-pay | Admitting: Oncology

## 2016-05-25 ENCOUNTER — Encounter (HOSPITAL_BASED_OUTPATIENT_CLINIC_OR_DEPARTMENT_OTHER): Payer: BLUE CROSS/BLUE SHIELD

## 2016-05-25 ENCOUNTER — Encounter (HOSPITAL_COMMUNITY): Payer: Self-pay

## 2016-05-25 VITALS — BP 123/67 | HR 71 | Temp 97.8°F | Resp 18

## 2016-05-25 DIAGNOSIS — D500 Iron deficiency anemia secondary to blood loss (chronic): Secondary | ICD-10-CM

## 2016-05-25 DIAGNOSIS — E538 Deficiency of other specified B group vitamins: Secondary | ICD-10-CM

## 2016-05-25 MED ORDER — SODIUM CHLORIDE 0.9 % IV SOLN
Freq: Once | INTRAVENOUS | Status: AC
  Administered 2016-05-25: 14:00:00 via INTRAVENOUS

## 2016-05-25 MED ORDER — CYANOCOBALAMIN 1000 MCG/ML IJ SOLN: 1000.0000 ug | Freq: Once | INTRAMUSCULAR | Status: DC

## 2016-05-25 MED ORDER — SODIUM CHLORIDE 0.9 % IV SOLN
510.0000 mg | Freq: Once | INTRAVENOUS | Status: AC
  Administered 2016-05-25: 510 mg via INTRAVENOUS
  Filled 2016-05-25: qty 17

## 2016-05-25 NOTE — Progress Notes (Signed)
Patient tolerated infusion well.  VSS.  Patient ambulatory and stable upon discharge from clinic.   

## 2016-05-25 NOTE — Patient Instructions (Signed)
[facility] at [facility] Discharge Instructions  RECOMMENDATIONS MADE BY THE CONSULTANT AND ANY TEST RESULTS WILL BE SENT TO YOUR REFERRING PHYSICIAN.  IV iron today.    Thank you for choosing [facility] at [facility] to provide your oncology and hematology care.  To afford each patient quality time with our provider, please arrive at least 15 minutes before your scheduled appointment time.    If you have a lab appointment with the Cancer Center please come in thru the  Main Entrance and check in at the main information desk  You need to re-schedule your appointment should you arrive 10 or more minutes late.  We strive to give you quality time with our providers, and arriving late affects you and other patients whose appointments are after yours.  Also, if you no show three or more times for appointments you may be dismissed from the clinic at the providers discretion.     Again, thank you for choosing [facility].  Our hope is that these requests will decrease the amount of time that you wait before being seen by our physicians.       _____________________________________________________________  Should you have questions after your visit to  Cancer Center, please contact our office at [phone] between the hours of 8:30 a.m. and 4:30 p.m.  Voicemails left after 4:30 p.m. will not be returned until the following business day.  For prescription refill requests, have your pharmacy contact our office.       Resources For Cancer Patients and their Caregivers ? American Cancer Society: Can assist with transportation, wigs, general needs, runs Look Good Feel Better.        1-888-227-6333 ? Cancer Care: Provides financial assistance, online support groups, medication/co-pay assistance.  1-800-813-HOPE (4673) ? [facility] Assists [place] cancer patients and their families through emotional  , educational and financial support.  336-427-4357 ? [place] DSS Where to apply for food stamps, Medicaid and utility assistance. 336-342-1394 ? RCATS: Transportation to medical appointments. 336-347-2287 ? Social Security Administration: May apply for disability if have a Stage IV cancer. 336-342-7796 1-800-772-1213 ? [place] Aging, Disability and Transit Services: Assists with nutrition, care and transit needs. 336-349-2343  Cancer Center Support Programs: @10RELATIVEDAYS@ > Cancer Support Group  2nd Tuesday of the month 1pm-2pm, Journey Room  > Creative Journey  3rd Tuesday of the month 1130am-1pm, Journey Room  > Look Good Feel Better  1st Wednesday of the month 10am-12 noon, Journey Room (Call American Cancer Society to register 1-800-395-5775)    

## 2016-05-29 ENCOUNTER — Other Ambulatory Visit: Payer: BLUE CROSS/BLUE SHIELD | Admitting: Obstetrics & Gynecology

## 2016-06-09 ENCOUNTER — Other Ambulatory Visit: Payer: BLUE CROSS/BLUE SHIELD | Admitting: Obstetrics & Gynecology

## 2016-06-16 ENCOUNTER — Telehealth: Payer: Self-pay | Admitting: Gastroenterology

## 2016-06-16 NOTE — Telephone Encounter (Signed)
PLEASE CALL PATIENT, SHE CAN NOT GET IN WITH [facility] UNTIL MAY BUT SHE HAS A FEW QUESTIONS AND WOULD FEEL BETTER AFTER TALKING TO A NURSE

## 2016-06-16 NOTE — Telephone Encounter (Signed)
I called pt and she had a couple of questions for Dr. Rene Kocher said NO RUSH.  One is that she has been taking Linzess and doing very well, but also taking iron. She has had some medium gray stools and just wants to know if it is anything to be concerned about. She said definitely note dark or tarry stools.  Also, she has gained about 10 lbs in the last 3-4 weeks, and she hasn't been eating that much more than usual.  She read on internet that if you are taking Vit B that Linzess could make you gain, and she just wants to know what Dr. Oneida Alar says.  She said definitely, NO RUSH.

## 2016-06-18 ENCOUNTER — Ambulatory Visit: Payer: BLUE CROSS/BLUE SHIELD | Admitting: Gastroenterology

## 2016-06-18 NOTE — Telephone Encounter (Signed)
PT called and said she has been vomiting today. No fever. No diarrhea. She has only had peanut butter crackers and ginger ale today. She was concerned because the color was yellow. She is aware Dr. Oneida Alar will be at the hospital tomorrow and will see her messages. She will try to get more fluids down this evening and call if she worsens.

## 2016-06-22 ENCOUNTER — Encounter: Payer: Self-pay | Admitting: Gastroenterology

## 2016-06-22 NOTE — Telephone Encounter (Addendum)
Called patient TO DISCUSS CONCERNS. GOT SICK AND THINKS SHE HAD A A VIRUS. HAD A PANIC ATTACK BECAUSE SHE THOUGHT MAYBE HER LIVER WAS GETTING WORSE. HAD OPV TO DISCUSS NEED FOR TCS. PT [facility] TO MAY 2018. REVIEWED TCS FROM 2014. ALL ISSUES ARE RESOLVED CURRENTLY.

## 2016-06-30 ENCOUNTER — Encounter (HOSPITAL_COMMUNITY): Payer: Self-pay | Admitting: Oncology

## 2016-06-30 ENCOUNTER — Encounter (HOSPITAL_COMMUNITY): Payer: BLUE CROSS/BLUE SHIELD

## 2016-06-30 ENCOUNTER — Ambulatory Visit (HOSPITAL_COMMUNITY): Payer: BLUE CROSS/BLUE SHIELD | Admitting: Hematology & Oncology

## 2016-06-30 ENCOUNTER — Encounter (HOSPITAL_COMMUNITY): Payer: BLUE CROSS/BLUE SHIELD | Attending: Oncology

## 2016-06-30 ENCOUNTER — Encounter (HOSPITAL_COMMUNITY): Payer: BLUE CROSS/BLUE SHIELD | Attending: Oncology | Admitting: Oncology

## 2016-06-30 ENCOUNTER — Other Ambulatory Visit (HOSPITAL_COMMUNITY): Payer: BLUE CROSS/BLUE SHIELD

## 2016-06-30 VITALS — BP 102/77 | HR 88 | Resp 16 | Ht 68.0 in | Wt 248.0 lb

## 2016-06-30 DIAGNOSIS — D500 Iron deficiency anemia secondary to blood loss (chronic): Secondary | ICD-10-CM | POA: Diagnosis not present

## 2016-06-30 DIAGNOSIS — E538 Deficiency of other specified B group vitamins: Secondary | ICD-10-CM | POA: Diagnosis not present

## 2016-06-30 DIAGNOSIS — K7030 Alcoholic cirrhosis of liver without ascites: Secondary | ICD-10-CM | POA: Diagnosis not present

## 2016-06-30 HISTORY — DX: Deficiency of other specified B group vitamins: E53.8

## 2016-06-30 LAB — COMPREHENSIVE METABOLIC PANEL
ALBUMIN: 4.5 g/dL (ref 3.5–5.0)
ALK PHOS: 76 U/L (ref 38–126)
ALT: 22 U/L (ref 14–54)
AST: 31 U/L (ref 15–41)
Anion gap: 10 (ref 5–15)
BUN: 16 mg/dL (ref 6–20)
CALCIUM: 9.9 mg/dL (ref 8.9–10.3)
CO2: 24 mmol/L (ref 22–32)
CREATININE: 1.31 mg/dL — AB (ref 0.44–1.00)
Chloride: 102 mmol/L (ref 101–111)
GFR calc Af Amer: 51 mL/min — ABNORMAL LOW (ref >60)
GFR calc non Af Amer: 44 mL/min — ABNORMAL LOW (ref >60)
GLUCOSE: 108 mg/dL — AB (ref 65–99)
Potassium: 4.1 mmol/L (ref 3.5–5.1)
SODIUM: 136 mmol/L (ref 135–145)
Total Bilirubin: 0.7 mg/dL (ref 0.3–1.2)
Total Protein: 8.4 g/dL — ABNORMAL HIGH (ref 6.5–8.1)

## 2016-06-30 LAB — CBC WITH DIFFERENTIAL/PLATELET
BASOS PCT: 1 %
Basophils Absolute: 0 10*3/uL (ref 0.0–0.1)
EOS ABS: 0.2 10*3/uL (ref 0.0–0.7)
Eosinophils Relative: 3 %
HCT: 43.8 % (ref 36.0–46.0)
HEMOGLOBIN: 15 g/dL (ref 12.0–15.0)
LYMPHS ABS: 1.3 10*3/uL (ref 0.7–4.0)
Lymphocytes Relative: 17 %
MCH: 31.2 pg (ref 26.0–34.0)
MCHC: 34.2 g/dL (ref 30.0–36.0)
MCV: 91.1 fL (ref 78.0–100.0)
Monocytes Absolute: 0.8 10*3/uL (ref 0.1–1.0)
Monocytes Relative: 10 %
NEUTROS PCT: 69 %
Neutro Abs: 5.5 10*3/uL (ref 1.7–7.7)
Platelets: 248 10*3/uL (ref 150–400)
RBC: 4.81 MIL/uL (ref 3.87–5.11)
RDW: 14.3 % (ref 11.5–15.5)
WBC: 7.8 10*3/uL (ref 4.0–10.5)

## 2016-06-30 LAB — FOLATE: FOLATE: 15.9 ng/mL (ref >5.9)

## 2016-06-30 LAB — IRON AND TIBC
Iron: 75 ug/dL (ref 28–170)
SATURATION RATIOS: 19 % (ref 10.4–31.8)
TIBC: 395 ug/dL (ref 250–450)
UIBC: 320 ug/dL

## 2016-06-30 LAB — FERRITIN: FERRITIN: 116 ng/mL (ref 11–307)

## 2016-06-30 LAB — VITAMIN B12: VITAMIN B 12: 650 pg/mL (ref 180–914)

## 2016-06-30 MED ORDER — CYANOCOBALAMIN 1000 MCG SL SUBL: 1.0000 | SUBLINGUAL_TABLET | Freq: Every day | SUBLINGUAL | 11 refills | Status: DC

## 2016-06-30 MED ORDER — CYANOCOBALAMIN 1000 MCG/ML IJ SOLN
INTRAMUSCULAR | Status: AC
  Filled 2016-06-30: qty 1

## 2016-06-30 MED ORDER — CYANOCOBALAMIN 1000 MCG/ML IJ SOLN
1000.0000 ug | Freq: Once | INTRAMUSCULAR | Status: AC
  Administered 2016-06-30: 1000 ug via INTRAMUSCULAR

## 2016-06-30 NOTE — Patient Instructions (Signed)
[place] at [facility] Discharge Instructions  RECOMMENDATIONS MADE BY THE CONSULTANT AND ANY TEST RESULTS WILL BE SENT TO YOUR REFERRING PHYSICIAN.  You were seen today by Kirby Crigler PA-C. Start SL B12 daily. Continue Iron daily. Return in 3 months for labs. Return in 6 months for labs and follow up.  THANK YOU FOR THE DONUTS!! Thank you for choosing [place] [facility] at [facility] to provide your oncology and hematology care.  To afford each patient quality time with our provider, please arrive at least 15 minutes before your scheduled appointment time.    If you have a lab appointment with the [facility] please come in thru the  Main Entrance and check in at the main information desk  You need to re-schedule your appointment should you arrive 10 or more minutes late.  We strive to give you quality time with our providers, and arriving late affects you and other patients whose appointments are after yours.  Also, if you no show three or more times for appointments you may be dismissed from the clinic at the providers discretion.     Again, thank you for choosing [facility].  Our hope is that these requests will decrease the amount of time that you wait before being seen by our physicians.       _____________________________________________________________  Should you have questions after your visit to [facility], please contact our office at [phone] between the hours of 8:30 a.m. and 4:30 p.m.  Voicemails left after 4:30 p.m. will not be returned until the following business day.  For prescription refill requests, have your pharmacy contact our office.       Resources For Cancer Patients and their Caregivers ? American Cancer Society: Can assist with transportation, wigs, general needs, runs Look Good Feel Better.        254 338 2497 ? Cancer Care: Provides financial assistance, online support  groups, medication/co-pay assistance.  1-800-813-HOPE (442)832-6428) ? [place] [facility] Assists [place] cancer patients and their families through emotional , educational and financial support.  279-236-4514 ? [place] DSS Where to apply for food stamps, Medicaid and utility assistance. 276 561 9325 ? RCATS: Transportation to medical appointments. (505) 657-3438 ? Social Security Administration: May apply for disability if have a Stage IV cancer. (762)844-1055 (769)182-7675 ? [employer], Disability and Transit Services: Assists with nutrition, care and transit needs. [place] Support Programs: @10RELATIVEDAYS @ > Cancer Support Group  2nd Tuesday of the month 1pm-2pm, Journey Room  > Creative Journey  3rd Tuesday of the month 1130am-1pm, Journey Room  > Look Good Feel Better  1st Wednesday of the month 10am-12 noon, Journey Room (Call [place] to register [phone])

## 2016-06-30 NOTE — Assessment & Plan Note (Addendum)
B12 IM replacement therapy.  She wishes to change to PO therapy.  Rx written for 1000 mcg SL daily.

## 2016-06-30 NOTE — Progress Notes (Signed)
Galt presents today for injection per MD orders. B12 1014mcg administered IM in right Upper Arm. Administration without incident. Patient tolerated well.

## 2016-06-30 NOTE — Progress Notes (Signed)
[name], MD [place] [id number]  Iron deficiency anemia due to chronic blood loss - Plan: CBC with Differential, Basic metabolic panel, Iron and TIBC, Ferritin, CBC with Differential, Basic metabolic panel, Iron and TIBC, Ferritin, CANCELED: CBC with Differential, CANCELED: Basic metabolic panel, CANCELED: Ferritin, CANCELED: Iron and TIBC  B12 deficiency - Plan: CBC with Differential, Vitamin B12, Cyanocobalamin 1000 MCG SUBL  Folate deficiency - Plan: CBC with Differential, Folate  Alcoholic cirrhosis of liver without ascites (HCC)  CURRENT THERAPY:  Ferrex forte and IV iron when necessary.  INTERVAL HISTORY: Deanna Thompson 61 y.o. 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  More recently, she underwent a flexible sigmoidoscopy with Dr. Oneida Alar on 01/21/2016 showing rectal bleeding due to proctitis and possibly internal hemorrhoids. AND H/O folate deficiency AND Cirrhosis of liver.  She is doing well.  She denies any blood in her stool or dark stools.  She denies any blood loss.  She denies any hematuria.  She notes ongoing follow-up with GI for her cirrhosis.  She is stable from that standpoint.  She would like to transition to PO B12.  She would like to decrease her lab check frequency if able.  We discussed surveillance options moving forward.  Review of Systems  Constitutional: Negative.  Negative for chills, fever and weight loss.  HENT: Negative.   Eyes: Negative.   Respiratory: Negative.  Negative for cough.   Cardiovascular: Negative.  Negative for chest pain.  Gastrointestinal: Negative.  Negative for  blood in stool, constipation, diarrhea, melena, nausea and vomiting.  Genitourinary: Negative.   Musculoskeletal: Negative.   Skin: Negative.   Neurological: Negative.  Negative for weakness.  Endo/Heme/Allergies: Negative.   Psychiatric/Behavioral: Negative.     Past Medical History:  Diagnosis Date  . Abdominal hernia   . Abscess of pulp of tooth 12/12/14   left side  . Anxiety   . Arthritis    back  and down flank  right side more than left  . B12 deficiency 06/12/2015  . Brain aneurysm    METAL COIL PREVENTS MRI IMAGING and CLIP  . Cirrhosis ([place])    Presumably alcoholic cirrhosis  . Complication of anesthesia   . Depression   . ETOH abuse    Quit in 07/2011   . Fatigue    due to Sjogren's disease  . Folate deficiency 06/30/2016  . GERD (gastroesophageal reflux disease)   . HTN (hypertension)    no longer medicated, dated on 04/25/12  . IBS (irritable bowel syndrome)   . Iron deficiency anemia due to chronic blood loss   . Myofascial pain   . Panic attack   . PONV (postoperative nausea and vomiting)   . Psoriatic arthritis ([place])   . Right inguinal hernia 08/16/2012  . Seizures ([place]) 1997   1 seizure from coil insertion but no more seizures and no meds for seizures  . Sjogren's disease ([place])   . [place] HEAD 02/03/2007   Qualifier: Diagnosis of  By: Aline Brochure MD, Dorothyann Peng    . 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([place])     Past Surgical History:  Procedure Laterality Date  . AGILE CAPSULE  N/A 06/21/2014   Procedure: AGILE CAPSULE;  Surgeon: Danie Binder, MD;  [facility];  Service: Endoscopy;  Laterality: N/A;  0700  . BIOPSY  10/13/2011   SLF: mild gastritis/duodenal polypoid lesion   . CEREBRAL ANEURYSM REPAIR  1997  . COLONOSCOPY  10/13/2011   EPP:IRJJOACZ hemorrhoids/varices rectal/lesion in ascending colon(HYPERPLASTIC POLYP)  . COLONOSCOPY N/A 01/17/2013   SLF: 1. Internal hemorrhoids 2. Ascending colon polyps 3.  Rectal varices.     . ESOPHAGOGASTRODUODENOSCOPY N/A 06/29/2014   SLF: 1. anemia due to polypoid lesions in teh antrum, and duodenal polyps 2. single duodenum AVM  . FLEXIBLE SIGMOIDOSCOPY N/A 01/21/2016   Procedure: FLEXIBLE SIGMOIDOSCOPY;  Surgeon: Danie Binder, MD;  [facility];  Service: Endoscopy;  Laterality: N/A;  245  . GIVENS CAPSULE STUDY N/A 06/29/2014   Procedure: GIVENS CAPSULE STUDY;  Surgeon: Danie Binder, MD;  [facility];  Service: Endoscopy;  Laterality: N/A;  . GIVENS CAPSULE STUDY N/A 07/13/2014   Procedure: GIVENS CAPSULE STUDY;  Surgeon: Danie Binder, MD;  [facility];  Service: Endoscopy;  Laterality: N/A;  700  . HEMORRHOID BANDING N/A 01/21/2016   Procedure: HEMORRHOID BANDING;  Surgeon: Danie Binder, MD;  [facility];  Service: Endoscopy;  Laterality: N/A;  . INGUINAL HERNIA REPAIR Right 08/28/2013   Procedure: RIGHT NGUINAL HERNIORRHAPHY WITH MESH;  Surgeon: Jamesetta So, MD;  Location: [facility];  Service: General;  Laterality: Right;  . INSERTION OF MESH N/A 06/09/2013   Procedure: INSERTION OF MESH;  Surgeon: Jamesetta So, MD;  Location: [facility];  Service: General;  Laterality: N/A;  . INSERTION OF MESH Right 08/28/2013   Procedure: INSERTION OF MESH;  Surgeon: Jamesetta So, MD;  Location: [facility];  Service: General;  Laterality: Right;  . PARACENTESIS  Feb 2014   10.7 liters  . TOOTH EXTRACTION Left 12/12/14  . UMBILICAL HERNIA REPAIR N/A 06/09/2013   Procedure: UMBILICAL HERNIORRHAPHY WITH MESH;  Surgeon: Jamesetta So, MD;  Location: [facility];  Service: General;  Laterality: N/A;    Family History  Problem Relation Age of Onset  . Cirrhosis Mother     nash  . COPD Father   . Heart disease Father   . Asthma    . Heart disease    . Colon cancer Neg Hx   . Inflammatory bowel disease Neg Hx     Social History   Social History  . Marital status: Married    Spouse name: N/A  . Number of children: 0  . Years of education:  N/A   Occupational History  .  Retired   Social History Main Topics  . Smoking status: Former Smoker    Packs/day: 2.00    Years: 12.00    Types: Cigarettes    Quit date: 03/31/1995  . Smokeless tobacco: Never Used     Comment: quit in 1997  . Alcohol use No     Comment: Bottle of wine a day for years, but quit in 07/2011  . Drug use: No  . Sexual activity: Not Currently    Birth control/ protection: Post-menopausal   Other Topics Concern  . None   Social History Narrative   Quit drinking alcohol 3 YEARS ago. No drugs, no IVDU.      PHYSICAL EXAMINATION  ECOG PERFORMANCE STATUS: 0 - Asymptomatic  Vitals:   06/30/16 1543  BP: 102/77  Pulse: 88  Resp: 16  GENERAL:alert, no distress, well nourished, well developed, comfortable, cooperative, obese, smiling and unaccompanied SKIN: skin color, texture, turgor are normal, no rashes or significant lesions HEAD: Normocephalic, No masses, lesions, tenderness or abnormalities EYES: normal, EOMI, Conjunctiva are pink and non-injected EARS: External ears normal OROPHARYNX:lips, buccal mucosa, and tongue normal and mucous membranes are moist  NECK: supple, trachea midline LYMPH:  no palpable lymphadenopathy BREAST:not examined LUNGS: clear to auscultation  HEART: regular rate & rhythm ABDOMEN:abdomen soft, obese and normal bowel sounds BACK: Back symmetric, no curvature. EXTREMITIES:less then 2 second capillary refill, no joint deformities, effusion, or inflammation, no skin discoloration, no cyanosis  NEURO: alert & oriented x 3 with fluent speech, no focal motor/sensory deficits, gait normal   LABORATORY DATA: CBC    Component Value Date/Time   WBC 7.8 06/30/2016 1038   RBC 4.81 06/30/2016 1038   HGB 15.0 06/30/2016 1038   HCT 43.8 06/30/2016 1038   HCT 41 07/30/2011 0743   PLT 248 06/30/2016 1038   MCV 91.1 06/30/2016 1038   MCV 104.3 07/30/2011 0743   MCH 31.2 06/30/2016 1038   MCHC 34.2 06/30/2016 1038   RDW  14.3 06/30/2016 1038   LYMPHSABS 1.3 06/30/2016 1038   MONOABS 0.8 06/30/2016 1038   EOSABS 0.2 06/30/2016 1038   BASOSABS 0.0 06/30/2016 1038      Chemistry      Component Value Date/Time   NA 136 06/30/2016 1038   NA 138 07/30/2011 0741   K 4.1 06/30/2016 1038   K 3.8 07/30/2011 0741   CL 102 06/30/2016 1038   CO2 24 06/30/2016 1038   BUN 16 06/30/2016 1038   BUN 4 07/30/2011 0741   CREATININE 1.31 (H) 06/30/2016 1038   CREATININE 1.33 (H) 08/05/2015 1310      Component Value Date/Time   CALCIUM 9.9 06/30/2016 1038   ALKPHOS 76 06/30/2016 1038   ALKPHOS 111 07/30/2011 0741   AST 31 06/30/2016 1038   AST 171 07/30/2011 0741   ALT 22 06/30/2016 1038   BILITOT 0.7 06/30/2016 1038   BILITOT 3.6 07/30/2011 0741        PENDING LABS:   RADIOGRAPHIC STUDIES:  No results found.   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  More recently, she underwent a flexible sigmoidoscopy with Dr. Oneida Alar on 01/21/2016 showing rectal bleeding due to proctitis and possibly internal hemorrhoids.  Due to cost of IV iron replacement therapy, she is currently on PO iron in an attempt to reduce/eliminate future need for IV iron replacement treatment.  Currently on ferrex forte.  Oncology Flowsheet 05/25/2016  ferumoxytol [facility]) IV 510 mg    Labs today: CBC diff, CMET, anemia panel.  I personally reviewed and went over laboratory results with the patient.  The results are noted within this dictation.  Vitamin studies are pending at this time.  Labs in 12 weeks: CBC diff, BMET, iron/TIBC, ferritin.  Labs in 6  months: CBC diff, BMET, anemia panel  Problem list reviewed with patient and edited accordingly.  Medications are reviewed with the patient and edited accordingly.  Return in 6 months for follow-up.  More than 50% of  the time spent with the patient was utilized for counseling and coordination of care.   B12 deficiency B12 IM replacement therapy.  She wishes to change to PO therapy.  Rx written for 1000 mcg SL daily.  Folate deficiency H/O folate deficiency  Cirrhosis ([place]) Followed by GI   ORDERS PLACED FOR THIS ENCOUNTER: Orders Placed This Encounter  Procedures  . CBC with Differential  . Basic metabolic panel  . Vitamin B12  . Folate  . Iron and TIBC  . Ferritin  . CBC with Differential  . Basic metabolic panel  . Iron and TIBC  . Ferritin    MEDICATIONS PRESCRIBED THIS ENCOUNTER: Meds ordered this encounter  Medications  . Cyanocobalamin 1000 MCG SUBL    Sig: Place 1 tablet (1,000 mcg total) under the tongue daily.    Dispense:  30 tablet    Refill:  11    Order Specific Question:   Supervising Provider    Answer:   Brunetta Genera [[id number]]    THERAPY PLAN:  Ongoing PO iron replacement and change in B12 to PO therapy.  Will provide IV iron replacement when necessary.  All questions were answered. The patient knows to call the clinic with any problems, questions or concerns. We can certainly see the patient much sooner if necessary.  Patient and plan discussed with Dr. Twana First and she is in agreement with the aforementioned.   This note is electronically signed by: Doy Mince 06/30/2016 5:13 PM

## 2016-06-30 NOTE — Assessment & Plan Note (Addendum)
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  More recently, she underwent a flexible sigmoidoscopy with Dr. Oneida Alar on 01/21/2016 showing rectal bleeding due to proctitis and possibly internal hemorrhoids.  Due to cost of IV iron replacement therapy, she is currently on PO iron in an attempt to reduce/eliminate future need for IV iron replacement treatment.  Currently on ferrex forte.  Oncology Flowsheet 05/25/2016  ferumoxytol [facility]) IV 510 mg    Labs today: CBC diff, CMET, anemia panel.  I personally reviewed and went over laboratory results with the patient.  The results are noted within this dictation.  Vitamin studies are pending at this time.  Labs in 12 weeks: CBC diff, BMET, iron/TIBC, ferritin.  Labs in 6 months: CBC diff, BMET, anemia panel  Problem list reviewed with patient and edited accordingly.  Medications are reviewed with the patient and edited accordingly.  Return in 6 months for follow-up.  More than 50% of the time spent with the patient was utilized for counseling and coordination of care.

## 2016-06-30 NOTE — Assessment & Plan Note (Signed)
H/O folate deficiency

## 2016-06-30 NOTE — Assessment & Plan Note (Signed)
Followed by GI

## 2016-07-06 ENCOUNTER — Encounter: Payer: Self-pay | Admitting: Gastroenterology

## 2016-07-06 ENCOUNTER — Other Ambulatory Visit: Payer: Self-pay | Admitting: Gastroenterology

## 2016-07-06 ENCOUNTER — Ambulatory Visit (INDEPENDENT_AMBULATORY_CARE_PROVIDER_SITE_OTHER): Payer: BLUE CROSS/BLUE SHIELD | Admitting: Gastroenterology

## 2016-07-06 ENCOUNTER — Other Ambulatory Visit: Payer: Self-pay

## 2016-07-06 DIAGNOSIS — Z86010 Personal history of colonic polyps: Secondary | ICD-10-CM

## 2016-07-06 DIAGNOSIS — K6289 Other specified diseases of anus and rectum: Secondary | ICD-10-CM | POA: Diagnosis not present

## 2016-07-06 DIAGNOSIS — K5901 Slow transit constipation: Secondary | ICD-10-CM | POA: Diagnosis not present

## 2016-07-06 DIAGNOSIS — K7030 Alcoholic cirrhosis of liver without ascites: Secondary | ICD-10-CM

## 2016-07-06 NOTE — Assessment & Plan Note (Signed)
SYMPTOMS FAIRLY WELL CONTROLLED.  OPEN LINZESS CAPSULE. PLACE GRANULES IN 4 TEASPOONS OF WATER. STIR IT FOR 30 SECONDS. TAKE 2 TSP OF THE WATER DAILY. FOLLOW UP IN AUG 2018.

## 2016-07-06 NOTE — Assessment & Plan Note (Signed)
SYMPTOMS CONTROLLED/RESOLVED.  CONTINUE TO MONITOR SYMPTOMS. 

## 2016-07-06 NOTE — Progress Notes (Signed)
ON RECALL  °

## 2016-07-06 NOTE — Progress Notes (Signed)
Subjective:    Patient ID: Deanna Thompson, female    DOB: 16-Feb-1956, 61 y.o.   MRN: [id number]  [name], MD  HPI Has a Pilonidal cysts that gives her trouble. Rectal symptoms resolved after enemas. BMs: HAD A MEDIUM GREY STOOL AFTER LINZESS JUST ABOUT EVERY DAY, ABOUT EVERY DAY. NO BRBPR OR MELENA. HAD DIARRHEA A FEW WEEKS AGO. HEARTBURN CONTROLLED WITH RANITIDINE BID.   PT DENIES FEVER, CHILLS, HEMATOCHEZIA, HEMATEMESIS, nausea, vomiting, melena, CHEST PAIN, SHORTNESS OF BREATH,  CHANGE IN BOWEL IN HABITS, constipation, abdominal pain, problems swallowing, problems with sedation, OR heartburn or indigestion.  Past Medical History:  Diagnosis Date  . Abdominal hernia   . Abscess of pulp of tooth 12/12/14   left side  . Anxiety   . Arthritis    back  and down flank  right side more than left  . B12 deficiency 06/12/2015  . Brain aneurysm    METAL COIL PREVENTS MRI IMAGING and CLIP  . Cirrhosis ([place])    Presumably alcoholic cirrhosis  . Complication of anesthesia   . Depression   . ETOH abuse    Quit in 07/2011   . Fatigue    due to Sjogren's disease  . Folate deficiency 06/30/2016  . GERD (gastroesophageal reflux disease)   . HTN (hypertension)    no longer medicated, dated on 04/25/12  . IBS (irritable bowel syndrome)   . Iron deficiency anemia due to chronic blood loss   . Myofascial pain   . Panic attack   . PONV (postoperative nausea and vomiting)   . Psoriatic arthritis ([place])   . Right inguinal hernia 08/16/2012  . Seizures ([place]) 1997   1 seizure from coil insertion but no more seizures and no meds for seizures  . Sjogren's disease ([place])   . [place] HEAD 02/03/2007   Qualifier: Diagnosis of  By: Aline Brochure MD, Dorothyann Peng    . 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([place])     Past Surgical History:  Procedure Laterality Date  . AGILE CAPSULE N/A 06/21/2014   Procedure: AGILE CAPSULE;  Surgeon: Danie Binder, MD;  [facility];  Service:  Endoscopy;  Laterality: N/A;  0700  . BIOPSY  10/13/2011   SLF: mild gastritis/duodenal polypoid lesion   . CEREBRAL ANEURYSM REPAIR  1997  . COLONOSCOPY  10/13/2011   FTD:DUKGURKY hemorrhoids/varices rectal/lesion in ascending colon(HYPERPLASTIC POLYP)  . COLONOSCOPY N/A 01/17/2013   SLF: 1. Internal hemorrhoids 2. Ascending colon polyps 3.  Rectal varices.   . ESOPHAGOGASTRODUODENOSCOPY N/A 06/29/2014   SLF: 1. anemia due to polypoid lesions in teh antrum, and duodenal polyps 2. single duodenum AVM  . FLEXIBLE SIGMOIDOSCOPY N/A 01/21/2016   Procedure: FLEXIBLE SIGMOIDOSCOPY;  Surgeon: Danie Binder, MD;  [facility];  Service: Endoscopy;  Laterality: N/A;  245  . GIVENS CAPSULE STUDY N/A 06/29/2014   Procedure: GIVENS CAPSULE STUDY;  Surgeon: Danie Binder, MD;  [facility];  Service: Endoscopy;  Laterality: N/A;  . GIVENS CAPSULE STUDY N/A 07/13/2014   Procedure: GIVENS CAPSULE STUDY;  Surgeon: Danie Binder, MD;  [facility];  Service: Endoscopy;  Laterality: N/A;  700  . HEMORRHOID BANDING N/A 01/21/2016   Procedure: HEMORRHOID BANDING;  Surgeon: Danie Binder, MD;  [facility];  Service: Endoscopy;  Laterality: N/A;  . INGUINAL HERNIA REPAIR Right 08/28/2013   Procedure: RIGHT NGUINAL HERNIORRHAPHY WITH MESH;  Surgeon: Jamesetta So, MD;  Location: [facility];  Service:  General;  Laterality: Right;  . INSERTION OF MESH N/A 06/09/2013   Procedure: INSERTION OF MESH;  Surgeon: Jamesetta So, MD;  Location: [facility];  Service: General;  Laterality: N/A;  . INSERTION OF MESH Right 08/28/2013   Procedure: INSERTION OF MESH;  Surgeon: Jamesetta So, MD;  Location: [facility];  Service: General;  Laterality: Right;  . PARACENTESIS  Feb 2014   10.7 liters  . TOOTH EXTRACTION Left 12/12/14  . UMBILICAL HERNIA REPAIR N/A 06/09/2013   Procedure: UMBILICAL HERNIORRHAPHY WITH MESH;  Surgeon: Jamesetta So, MD;  Location: [facility];  Service: General;  Laterality:  N/A;    Allergies  Allergen Reactions  . Omeprazole Shortness Of Breath  . Other Shortness Of Breath and Rash    Red Meat Causes shortness of breath, rash and flu like symptoms.   . Phenytoin Rash  . Keflex [Cephalexin] Other (See Comments)    Severe yeast infection  . Phenergan [Promethazine Hcl] Other (See Comments)    Not in right state of mind, HALLUCINATION   Current Outpatient Prescriptions  Medication Sig Dispense Refill  . acetaminophen (TYLENOL) 500 MG tablet Take 1,000 mg by mouth every 6 (six) hours as needed for moderate pain.    . bumetanide (BUMEX) 1 MG tablet TAKE (1) TABLET BY MOUTH (2) TIMES DAILY.    Marland Kitchen buPROPion (WELLBUTRIN SR) 100 MG 12 hr tablet TAKE (1) TABLET BY MOUTH TWICE DAILY.    . carboxymethylcellulose (REFRESH PLUS) 0.5 % SOLN Place 2 drops into both eyes 3 (three) times daily as needed (Dry Eyes).     . cetirizine (ZYRTEC) 10 MG tablet Take 10 mg by mouth as needed for allergies.    . Cyanocobalamin 1000 MCG SUBL Place 1 tablet (1,000 mcg total) under the tongue daily.    . cycloSPORINE (RESTASIS) 0.05 % ophthalmic emulsion Place 1 drop into both eyes 2 (two) times daily.    Marland Kitchen docusate sodium (COLACE) 100 MG capsule Take 100 mg by mouth daily.    Marland Kitchen escitalopram (LEXAPRO) 20 MG tablet TAKE (1) TABLET BY MOUTH AT BEDTIME.    Marland Kitchen Lidocaine-Hydrocortisone Ace 3-2.5 % KIT APPLY TO RECTUM QID FOR 2 WEEKS    . linaclotide (LINZESS) 72 MCG capsule Take 1 capsule (72 mcg total) by mouth daily before breakfast.    . Polysacchar Iron-FA-B12 (FERREX 150 FORTE) 150-1-25 MG-MG-MCG CAPS Take 1 capsule by mouth daily.    . ranitidine (ZANTAC) 150 MG tablet Take 1 tablet (150 mg total) by mouth 2 (two) times daily.    . sodium chloride (OCEAN) 0.65 % nasal spray Place 1 spray into the nose as needed for congestion.    Marland Kitchen spironolactone (ALDACTONE) 100 MG tablet TAKE (1) TABLET BY MOUTH TWICE DAILY.    Marland Kitchen amitriptyline (ELAVIL) 10 MG tablet TAKE (1) TABLET BY MOUTH AT BEDTIME.     Marland Kitchen loratadine (CLARITIN) 10 MG tablet Take 10 mg by mouth daily as needed for allergies.     .       Review of Systems PER HPI OTHERWISE ALL SYSTEMS ARE NEGATIVE.    Objective:   Physical Exam  Constitutional: She is oriented to person, place, and time. She appears well-developed and well-nourished. No distress.  HENT:  Head: Normocephalic and atraumatic.  Mouth/Throat: Oropharynx is clear and moist. No oropharyngeal exudate.  Eyes: Pupils are equal, round, and reactive to light. No scleral icterus.  Neck: Normal range of motion. Neck supple.  Cardiovascular: Normal rate, regular rhythm and normal  heart sounds.   Pulmonary/Chest: Effort normal and breath sounds normal. No respiratory distress.  Abdominal: Soft. Bowel sounds are normal. She exhibits no distension. There is no tenderness.  Musculoskeletal: She exhibits no edema.  Lymphadenopathy:    She has no cervical adenopathy.  Neurological: She is alert and oriented to person, place, and time.  Psychiatric: She has a normal mood and affect.  Vitals reviewed.     Assessment & Plan:

## 2016-07-06 NOTE — Patient Instructions (Signed)
SLF had on encounter form to give patient Suprep sample. We are out of Suprep samples. Gave pt Moviprep sample.

## 2016-07-06 NOTE — Assessment & Plan Note (Signed)
WELL COMPENSATED DISEASE.  FOLLOW UP IN AUG 2017.

## 2016-07-06 NOTE — Patient Instructions (Signed)
OPEN LINZESS CAPSULE. PLACE GRANULES IN 4 TEASPOONS OF WATER. STIR IT FOR 30 SECONDS. TAKE 2 TSP OF THE WATER DAILY.  COMPLETE COLONOSCOPY MAY 5.  FOLLOW UP IN 6 MOS.

## 2016-07-06 NOTE — Assessment & Plan Note (Signed)
NO WARNING SIGNS/SYMPTOMS  COLONOSCOPY AFTER MAY 5. DISCUSSED PROCEDURE, BENEFITS, & RISKS: < 1% chance of medication reaction, bleeding, perforation, or rupture of spleen/liver. SUPREP.

## 2016-07-06 NOTE — Progress Notes (Signed)
cc'ed to pcp °

## 2016-07-24 ENCOUNTER — Telehealth: Payer: Self-pay

## 2016-07-24 NOTE — Telephone Encounter (Signed)
Pt called and said she has been doing well on Linzess 72 mcg until a few days ago she had a terrible episode of an explosion. She held a couple of days then took it again and the same result.  But it also contained coffee grounds looking substance which concerned her. Please advise for pt in Dr. Oneida Alar absence. Routing to Roseanne Kaufman, NP.

## 2016-07-24 NOTE — Telephone Encounter (Signed)
If not sticky, tarry, black stool, then monitor.   IF SHE HAS black, tarry stool, go to ED. If continued diarrhea with Linzess 72, then do Miralax prn and return to Linzess 72 mcg daily to every other day for soft BM.

## 2016-07-24 NOTE — Telephone Encounter (Signed)
Pt said it is not been BLACK AND TARRY, but she will go to ED if it is. She is aware of instructions.

## 2016-08-06 ENCOUNTER — Ambulatory Visit: Payer: BLUE CROSS/BLUE SHIELD | Admitting: Gastroenterology

## 2016-08-07 ENCOUNTER — Telehealth: Payer: Self-pay

## 2016-08-07 NOTE — Telephone Encounter (Signed)
Pt called office and LMOVM. Stated she is scheduled for colonoscopy Monday (08/10/16) with SLF and she has been taking Iron, however she ran out of Iron and hasn't taken it in 4 days. JL advised she would be ok. Called pt and told her not to take any Iron until after procedure.

## 2016-08-10 ENCOUNTER — Encounter (HOSPITAL_COMMUNITY)
Admission: RE | Disposition: A | Discharge status: Home / Self Care | Payer: Self-pay | Source: Ambulatory Visit | Attending: Gastroenterology

## 2016-08-10 ENCOUNTER — Encounter (HOSPITAL_COMMUNITY): Payer: Self-pay | Admitting: *Deleted

## 2016-08-10 ENCOUNTER — Ambulatory Visit (HOSPITAL_COMMUNITY)
Admission: RE | Admit: 2016-08-10 | Discharge: 2016-08-10 | Disposition: A | Discharge status: Home / Self Care | Payer: BLUE CROSS/BLUE SHIELD | Source: Ambulatory Visit | Attending: Gastroenterology | Admitting: Gastroenterology

## 2016-08-10 DIAGNOSIS — Z881 Allergy status to other antibiotic agents status: Secondary | ICD-10-CM | POA: Insufficient documentation

## 2016-08-10 DIAGNOSIS — F329 Major depressive disorder, single episode, unspecified: Secondary | ICD-10-CM | POA: Insufficient documentation

## 2016-08-10 DIAGNOSIS — K7460 Unspecified cirrhosis of liver: Secondary | ICD-10-CM | POA: Insufficient documentation

## 2016-08-10 DIAGNOSIS — I10 Essential (primary) hypertension: Secondary | ICD-10-CM | POA: Diagnosis not present

## 2016-08-10 DIAGNOSIS — M359 Systemic involvement of connective tissue, unspecified: Secondary | ICD-10-CM | POA: Insufficient documentation

## 2016-08-10 DIAGNOSIS — Z79899 Other long term (current) drug therapy: Secondary | ICD-10-CM | POA: Insufficient documentation

## 2016-08-10 DIAGNOSIS — K589 Irritable bowel syndrome without diarrhea: Secondary | ICD-10-CM | POA: Diagnosis not present

## 2016-08-10 DIAGNOSIS — Z1211 Encounter for screening for malignant neoplasm of colon: Secondary | ICD-10-CM | POA: Insufficient documentation

## 2016-08-10 DIAGNOSIS — K644 Residual hemorrhoidal skin tags: Secondary | ICD-10-CM | POA: Insufficient documentation

## 2016-08-10 DIAGNOSIS — M3500 Sicca syndrome, unspecified: Secondary | ICD-10-CM | POA: Insufficient documentation

## 2016-08-10 DIAGNOSIS — Z8249 Family history of ischemic heart disease and other diseases of the circulatory system: Secondary | ICD-10-CM | POA: Diagnosis not present

## 2016-08-10 DIAGNOSIS — Z888 Allergy status to other drugs, medicaments and biological substances status: Secondary | ICD-10-CM | POA: Insufficient documentation

## 2016-08-10 DIAGNOSIS — K219 Gastro-esophageal reflux disease without esophagitis: Secondary | ICD-10-CM | POA: Diagnosis not present

## 2016-08-10 DIAGNOSIS — Z86010 Personal history of colonic polyps: Secondary | ICD-10-CM | POA: Diagnosis not present

## 2016-08-10 DIAGNOSIS — Z87891 Personal history of nicotine dependence: Secondary | ICD-10-CM | POA: Diagnosis not present

## 2016-08-10 DIAGNOSIS — Z8679 Personal history of other diseases of the circulatory system: Secondary | ICD-10-CM | POA: Diagnosis not present

## 2016-08-10 DIAGNOSIS — K648 Other hemorrhoids: Secondary | ICD-10-CM | POA: Diagnosis not present

## 2016-08-10 DIAGNOSIS — E538 Deficiency of other specified B group vitamins: Secondary | ICD-10-CM | POA: Diagnosis not present

## 2016-08-10 DIAGNOSIS — Q438 Other specified congenital malformations of intestine: Secondary | ICD-10-CM | POA: Diagnosis not present

## 2016-08-10 DIAGNOSIS — Z8719 Personal history of other diseases of the digestive system: Secondary | ICD-10-CM | POA: Diagnosis not present

## 2016-08-10 DIAGNOSIS — Z9889 Other specified postprocedural states: Secondary | ICD-10-CM | POA: Insufficient documentation

## 2016-08-10 DIAGNOSIS — L4050 Arthropathic psoriasis, unspecified: Secondary | ICD-10-CM | POA: Diagnosis not present

## 2016-08-10 DIAGNOSIS — F410 Panic disorder [episodic paroxysmal anxiety] without agoraphobia: Secondary | ICD-10-CM | POA: Diagnosis not present

## 2016-08-10 DIAGNOSIS — Z836 Family history of other diseases of the respiratory system: Secondary | ICD-10-CM | POA: Insufficient documentation

## 2016-08-10 DIAGNOSIS — Z8379 Family history of other diseases of the digestive system: Secondary | ICD-10-CM | POA: Insufficient documentation

## 2016-08-10 HISTORY — PX: COLONOSCOPY: SHX5424

## 2016-08-10 SURGERY — COLONOSCOPY
Anesthesia: Moderate Sedation

## 2016-08-10 MED ORDER — SODIUM CHLORIDE 0.9 % IV SOLN
INTRAVENOUS | Status: DC
  Administered 2016-08-10: 08:00:00 via INTRAVENOUS

## 2016-08-10 MED ORDER — FENTANYL CITRATE (PF) 100 MCG/2ML IJ SOLN
INTRAMUSCULAR | Status: AC
  Filled 2016-08-10: qty 2

## 2016-08-10 MED ORDER — MEPERIDINE HCL 100 MG/ML IJ SOLN: INTRAMUSCULAR | Status: DC | PRN

## 2016-08-10 MED ORDER — MIDAZOLAM HCL 5 MG/5ML IJ SOLN
INTRAMUSCULAR | Status: AC
  Filled 2016-08-10: qty 10

## 2016-08-10 MED ORDER — FENTANYL CITRATE (PF) 100 MCG/2ML IJ SOLN
INTRAMUSCULAR | Status: DC | PRN
Start: 2016-08-10 — End: 2016-08-10
  Administered 2016-08-10: 50 ug via INTRAVENOUS
  Administered 2016-08-10 (×2): 25 ug via INTRAVENOUS

## 2016-08-10 MED ORDER — MIDAZOLAM HCL 5 MG/5ML IJ SOLN
INTRAMUSCULAR | Status: DC | PRN
  Administered 2016-08-10 (×3): 2 mg via INTRAVENOUS
  Administered 2016-08-10 (×2): 1 mg via INTRAVENOUS

## 2016-08-10 MED ORDER — ONDANSETRON HCL 4 MG/2ML IJ SOLN
INTRAMUSCULAR | Status: AC
  Filled 2016-08-10: qty 2

## 2016-08-10 MED ORDER — ONDANSETRON HCL 4 MG/2ML IJ SOLN
4.0000 mg | Freq: Once | INTRAMUSCULAR | Status: AC
  Administered 2016-08-10: 4 mg via INTRAVENOUS

## 2016-08-10 NOTE — Op Note (Signed)
[facility] Patient Name: [facility] Procedure Date: 08/10/2016 7:33 AM MRN: [id number] Date of Birth: 07-Jul-1955 Attending MD: Barney Drain , MD CSN: [id number] Age: 61 Admit Type: Outpatient Procedure:                Colonoscopy, SCREENING Indications:              High risk colon cancer surveillance: Personal                            history of colonic polyps Providers:                Barney Drain, MD, Lurline Del, RN, Charlyne Petrin                            RN, RN Referring MD:             Rosemary Holms, MD Medicines:                Ondansetron 4 mg IV, Fentanyl 100 micrograms IV,                            Midazolam 8 mg IV Complications:            No immediate complications. Estimated Blood Loss:     Estimated blood loss: none. Procedure:                Pre-Anesthesia Assessment:                           - Prior to the procedure, a History and Physical                            was performed, and patient medications and                            allergies were reviewed. The patient's tolerance of                            previous anesthesia was also reviewed. The risks                            and benefits of the procedure and the sedation                            options and risks were discussed with the patient.                            All questions were answered, and informed consent                            was obtained. Prior Anticoagulants: The patient has                            taken no previous anticoagulant or antiplatelet  agents. ASA Grade Assessment: II - A patient with                            mild systemic disease. After reviewing the risks                            and benefits, the patient was deemed in                            satisfactory condition to undergo the procedure.                            After obtaining informed consent, the colonoscope                            was passed under  direct vision. Throughout the                            procedure, the patient's blood pressure, pulse, and                            oxygen saturations were monitored continuously. The                            EC-[id number] ([id number]) scope was introduced through                            the anus and advanced to the the cecum, identified                            by appendiceal orifice and ileocecal valve. The                            colonoscopy was somewhat difficult due to a                            tortuous colon. Successful completion of the                            procedure was aided by COLOWRAP. The patient                            tolerated the procedure fairly well. The quality of                            the bowel preparation was good. The ileocecal                            valve, appendiceal orifice, and rectum were                            photographed. Scope In: 9:11:17 AM Scope Out: 9:31:10 AM Scope Withdrawal Time: 0 hours 12 minutes 49 seconds  Total Procedure Duration:  0 hours 19 minutes 53 seconds  Findings:      The recto-sigmoid colon, sigmoid colon and descending colon were       moderately redundant.      The exam was otherwise without abnormality.      External and internal hemorrhoids were found during retroflexion. The       hemorrhoids were small. Impression:               - Redundant LEFT colon.                           - The examination was otherwise normal.                           - External and internal hemorrhoids. Moderate Sedation:      Moderate (conscious) sedation was administered by the endoscopy nurse       and supervised by the endoscopist. The following parameters were       monitored: oxygen saturation, heart rate, blood pressure, and response       to care. Total physician intraservice time was 38 minutes. Recommendation:           - Repeat colonoscopy in 5-10 years for surveillance.                           - High fiber  diet.                           - Continue present medications.                           - Patient has a contact number available for                            emergencies. The signs and symptoms of potential                            delayed complications were discussed with the                            patient. Return to normal activities tomorrow.                            Written discharge instructions were provided to the                            patient. Procedure Code(s):        --- Professional ---                           [id number], Colonoscopy, flexible; diagnostic, including                            collection of specimen(s) by brushing or washing,                            when performed (separate procedure)  79150, Moderate sedation services provided by the                            same physician or other qualified health care                            professional performing the diagnostic or                            therapeutic service that the sedation supports,                            requiring the presence of an independent trained                            observer to assist in the monitoring of the                            patient's level of consciousness and physiological                            status; initial 15 minutes of intraservice time,                            patient age 9 years or older                           [phone], Moderate sedation services; each additional                            15 minutes intraservice time                           99153, Moderate sedation services; each additional                            15 minutes intraservice time Diagnosis Code(s):        --- Professional ---                           Z86.010, Personal history of colonic polyps                           K64.8, Other hemorrhoids                           Q43.8, Other specified congenital malformations of                             intestine CPT copyright 2016 [employer]. All rights reserved. The codes documented in this report are preliminary and upon coder review may  be revised to meet current compliance requirements. Barney Drain, MD Barney Drain, MD 08/10/2016 9:42:06 AM This report has been signed electronically. Number of Addenda: 0

## 2016-08-10 NOTE — H&P (Signed)
Primary Care Physician:  Mikey Kirschner, MD Primary Gastroenterologist:  Dr. Oneida Alar  Pre-Procedure History & Physical: HPI:  Deanna Thompson is a 61 y.o. female here for  PERSONAL HISTORY OF POLYPS.  Past Medical History:  Diagnosis Date  . Abdominal hernia   . Abscess of pulp of tooth 12/12/14   left side  . Anxiety   . Arthritis    back  and down flank  right side more than left  . B12 deficiency 06/12/2015  . Brain aneurysm    METAL COIL PREVENTS MRI IMAGING and CLIP  . Cirrhosis ([place])    Presumably alcoholic cirrhosis  . Complication of anesthesia   . Depression   . ETOH abuse    Quit in 07/2011   . Fatigue    due to Sjogren's disease  . Folate deficiency 06/30/2016  . GERD (gastroesophageal reflux disease)   . HTN (hypertension)    no longer medicated, dated on 04/25/12  . IBS (irritable bowel syndrome)   . Iron deficiency anemia due to chronic blood loss   . Myofascial pain   . Panic attack   . PONV (postoperative nausea and vomiting)   . Psoriatic arthritis ([place])   . Right inguinal hernia 08/16/2012  . Seizures ([place]) 1997   1 seizure from coil insertion but no more seizures and no meds for seizures  . Sjogren's disease ([place])   . [place] HEAD 02/03/2007   Qualifier: Diagnosis of  By: Aline Brochure MD, Dorothyann Peng    . 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([place])     Past Surgical History:  Procedure Laterality Date  . AGILE CAPSULE N/A 06/21/2014   Procedure: AGILE CAPSULE;  Surgeon: Danie Binder, MD;  [facility]: [facility];  Service: Endoscopy;  Laterality: N/A;  0700  . BIOPSY  10/13/2011   SLF: mild gastritis/duodenal polypoid lesion   . CEREBRAL ANEURYSM REPAIR  1997  . COLONOSCOPY  10/13/2011   RDE:YCXKGYJE hemorrhoids/varices rectal/lesion in ascending colon(HYPERPLASTIC POLYP)  . COLONOSCOPY N/A 01/17/2013   SLF: 1. Internal hemorrhoids 2. Ascending colon polyps 3.  Rectal varices.   . ESOPHAGOGASTRODUODENOSCOPY N/A 06/29/2014   SLF: 1. anemia  due to polypoid lesions in teh antrum, and duodenal polyps 2. single duodenum AVM  . FLEXIBLE SIGMOIDOSCOPY N/A 01/21/2016   Procedure: FLEXIBLE SIGMOIDOSCOPY;  Surgeon: Danie Binder, MD;  [facility]: [facility];  Service: Endoscopy;  Laterality: N/A;  245  . GIVENS CAPSULE STUDY N/A 06/29/2014   Procedure: GIVENS CAPSULE STUDY;  Surgeon: Danie Binder, MD;  [facility];  Service: Endoscopy;  Laterality: N/A;  . GIVENS CAPSULE STUDY N/A 07/13/2014   Procedure: GIVENS CAPSULE STUDY;  Surgeon: Danie Binder, MD;  [facility];  Service: Endoscopy;  Laterality: N/A;  700  . HEMORRHOID BANDING N/A 01/21/2016   Procedure: HEMORRHOID BANDING;  Surgeon: Danie Binder, MD;  [facility];  Service: Endoscopy;  Laterality: N/A;  . INGUINAL HERNIA REPAIR Right 08/28/2013   Procedure: RIGHT NGUINAL HERNIORRHAPHY WITH MESH;  Surgeon: Jamesetta So, MD;  Location: [facility];  Service: General;  Laterality: Right;  . INSERTION OF MESH N/A 06/09/2013   Procedure: INSERTION OF MESH;  Surgeon: Jamesetta So, MD;  Location: [facility];  Service: General;  Laterality: N/A;  . INSERTION OF MESH Right 08/28/2013   Procedure: INSERTION OF MESH;  Surgeon: Jamesetta So, MD;  Location: [facility];  Service: General;  Laterality: Right;  . PARACENTESIS  Feb 2014  10.7 liters  . TOOTH EXTRACTION Left 12/12/14  . UMBILICAL HERNIA REPAIR N/A 06/09/2013   Procedure: UMBILICAL HERNIORRHAPHY WITH MESH;  Surgeon: Jamesetta So, MD;  Location: [facility];  Service: General;  Laterality: N/A;    Prior to Admission medications   Medication Sig Start Date End Date Taking? Authorizing Provider  acetaminophen (TYLENOL) 500 MG tablet Take 500 mg by mouth daily as needed for moderate pain.    Yes [provider]  amitriptyline (ELAVIL) 10 MG tablet TAKE (1) TABLET BY MOUTH AT BEDTIME. 07/06/16  Yes Mahala Menghini, PA-C  bumetanide (BUMEX) 1 MG tablet TAKE (1) TABLET BY MOUTH (2) TIMES DAILY. 04/01/16  Yes  Nadim Malia L, MD  buPROPion (WELLBUTRIN SR) 100 MG 12 hr tablet TAKE (1) TABLET BY MOUTH TWICE DAILY. 05/04/16  Yes Mikey Kirschner, MD  cetirizine (ZYRTEC) 10 MG tablet Take 10 mg by mouth as needed for allergies.   Yes [provider]  Cyanocobalamin 1000 MCG SUBL Place 1 tablet (1,000 mcg total) under the tongue daily. 06/30/16  Yes Kefalas, Manon Hilding, PA-C  cycloSPORINE (RESTASIS) 0.05 % ophthalmic emulsion Place 1 drop into both eyes 2 (two) times daily.   Yes [provider]  docusate sodium (COLACE) 100 MG capsule Take 100 mg by mouth daily.   Yes [provider]  escitalopram (LEXAPRO) 20 MG tablet TAKE (1) TABLET BY MOUTH AT BEDTIME. 05/04/16  Yes Mikey Kirschner, MD  IRON PO Take 1 tablet by mouth daily.   Yes [provider]  linaclotide (LINZESS) 72 MCG capsule Take 1 capsule (72 mcg total) by mouth daily before breakfast. 04/01/16  Yes Gasper Hopes, Marga Melnick, MD  Polyvinyl Alcohol-Povidone (REFRESH OP) Place 1 drop into both eyes 3 (three) times daily as needed (dry eyes).   Yes [provider]  ranitidine (ZANTAC) 150 MG tablet Take 1 tablet (150 mg total) by mouth 2 (two) times daily. 04/01/16  Yes Mistey Hoffert L, MD  sodium chloride (OCEAN) 0.65 % nasal spray Place 1 spray into the nose daily as needed for congestion.    Yes [provider]  spironolactone (ALDACTONE) 100 MG tablet TAKE (1) TABLET BY MOUTH TWICE DAILY. 04/01/16  Yes Calixto Pavel L, MD  Lidocaine-Hydrocortisone Ace 3-2.5 % KIT APPLY TO RECTUM QID FOR 2 WEEKS Patient not taking: Reported on 08/05/2016 01/08/16   Danie Binder, MD  mesalamine (ROWASA) 4 g enema Place 60 mLs (4 g total) rectally at bedtime. Patient not taking: Reported on 07/06/2016 02/04/16   Danie Binder, MD  Polysacchar Iron-FA-B12 [facility] 150 FORTE) 150-1-25 MG-MG-MCG CAPS Take 1 capsule by mouth daily. Patient not taking: Reported on 08/05/2016 08/28/15   Baird Cancer, PA-C    Allergies as of  07/06/2016 - Review Complete 07/06/2016  Allergen Reaction Noted  . Omeprazole Shortness Of Breath 08/19/2011  . Other Shortness Of Breath and Rash 01/09/2013  . Phenytoin Rash   . Keflex [cephalexin] Other (See Comments)   . Phenergan [promethazine hcl] Other (See Comments) 04/08/2012    Family History  Problem Relation Age of Onset  . Cirrhosis Mother        nash  . COPD Father   . Heart disease Father   . Asthma Unknown   . Heart disease Unknown   . Colon cancer Neg Hx   . Inflammatory bowel disease Neg Hx     Social History   Social History  . Marital status: Married    Spouse name:  N/A  . Number of children: 0  . Years of education: N/A   Occupational History  .  Retired   Social History Main Topics  . Smoking status: Former Smoker    Packs/day: 2.00    Years: 12.00    Types: Cigarettes    Quit date: 03/31/1995  . Smokeless tobacco: Never Used     Comment: quit in 1997  . Alcohol use No     Comment: Bottle of wine a day for years, but quit in 07/2011  . Drug use: No  . Sexual activity: Not Currently    Birth control/ protection: Post-menopausal   Other Topics Concern  . Not on file   Social History Narrative   Quit drinking alcohol 3 YEARS ago. No drugs, no IVDU.     Review of Systems: See HPI, otherwise negative ROS   Physical Exam: BP 119/81   Pulse 77   Temp 97.8 F (36.6 C) (Oral)   Resp 19   Ht '5\' 8"'$  (1.727 m)   Wt 238 lb (108 kg)   SpO2 98%   BMI 36.19 kg/m  General:   Alert,  pleasant and cooperative in NAD Head:  Normocephalic and atraumatic. Neck:  Supple; Lungs:  Clear throughout to auscultation.    Heart:  Regular rate and rhythm. Abdomen:  Soft, nontender and nondistended. Normal bowel sounds, without guarding, and without rebound.   Neurologic:  Alert and  oriented x4;  grossly normal neurologically.  Impression/Plan:     PERSONAL HISTORY OF POLYPS.  PLAN: 1. TCS TODAY. DISCUSSED PROCEDURE, BENEFITS, & RISKS: < 1% chance  of medication reaction, bleeding, perforation, or rupture of spleen/liver.

## 2016-08-10 NOTE — Discharge Instructions (Signed)
YOU DID NOT HAVE ANY POLYPS. You have a redundant left colon, which is likely why you have trouble with constipation.  You have SMALL internal AND EXTERNAL hemorrhoids.    DRINK WATER TO KEEP YOUR URINE LIGHT YELLOW.  FOLLOW A HIGH FIBER DIET. AVOID ITEMS THAT CAUSE BLOATING & GAS. SEE INFO BELOW.  Next colonoscopy in 5-10 years.   Colonoscopy Care After Read the instructions outlined below and refer to this sheet in the next week. These discharge instructions provide you with general information on caring for yourself after you leave the hospital. While your treatment has been planned according to the most current medical practices available, unavoidable complications occasionally occur. If you have any problems or questions after discharge, call DR. Saharah Sherrow, [phone].  ACTIVITY  You may resume your regular activity, but move at a slower pace for the next 24 hours.   Take frequent rest periods for the next 24 hours.   Walking will help get rid of the air and reduce the bloated feeling in your belly (abdomen).   No driving for 24 hours (because of the medicine (anesthesia) used during the test).   You may shower.   Do not sign any important legal documents or operate any machinery for 24 hours (because of the anesthesia used during the test).    NUTRITION  Drink plenty of fluids.   You may resume your normal diet as instructed by your doctor.   Begin with a light meal and progress to your normal diet. Heavy or fried foods are harder to digest and may make you feel sick to your stomach (nauseated).   Avoid alcoholic beverages for 24 hours or as instructed.    MEDICATIONS  You may resume your normal medications.   WHAT YOU CAN EXPECT TODAY  Some feelings of bloating in the abdomen.   Passage of more gas than usual.   Spotting of blood in your stool or on the toilet paper  .  IF YOU HAD POLYPS REMOVED DURING THE COLONOSCOPY:  Eat a soft diet IF YOU HAVE  NAUSEA, BLOATING, ABDOMINAL PAIN, OR VOMITING.    FINDING OUT THE RESULTS OF YOUR TEST Not all test results are available during your visit. DR. Oneida Alar WILL CALL YOU WITHIN 14 DAYS OF YOUR PROCEDUE WITH YOUR RESULTS. Do not assume everything is normal if you have not heard from DR. Skyra Crichlow, CALL HER OFFICE AT [phone].  SEEK IMMEDIATE MEDICAL ATTENTION AND CALL THE OFFICE: [phone] IF:  You have more than a spotting of blood in your stool.   Your belly is swollen (abdominal distention).   You are nauseated or vomiting.   You have a temperature over 101F.   You have abdominal pain or discomfort that is severe or gets worse throughout the day.   High-Fiber Diet A high-fiber diet changes your normal diet to include more whole grains, legumes, fruits, and vegetables. Changes in the diet involve replacing refined carbohydrates with unrefined foods. The calorie level of the diet is essentially unchanged. The Dietary Reference Intake (recommended amount) for adult males is 38 grams per day. For adult females, it is 25 grams per day. Pregnant and lactating women should consume 28 grams of fiber per day. Fiber is the intact part of a plant that is not broken down during digestion. Functional fiber is fiber that has been isolated from the plant to provide a beneficial effect in the body. PURPOSE  Increase stool bulk.   Ease and regulate bowel movements.   Lower  cholesterol.   REDUCE RISK OF COLON CANCER  INDICATIONS THAT YOU NEED MORE FIBER  Constipation and hemorrhoids.   Uncomplicated diverticulosis (intestine condition) and irritable bowel syndrome.   Weight management.   As a protective measure against hardening of the arteries (atherosclerosis), diabetes, and cancer.   GUIDELINES FOR INCREASING FIBER IN THE DIET  Start adding fiber to the diet slowly. A gradual increase of about 5 more grams (2 slices of whole-wheat bread, 2 servings of most fruits or vegetables, or 1  bowl of high-fiber cereal) per day is best. Too rapid an increase in fiber may result in constipation, flatulence, and bloating.   Drink enough water and fluids to keep your urine clear or pale yellow. Water, juice, or caffeine-free drinks are recommended. Not drinking enough fluid may cause constipation.   Eat a variety of high-fiber foods rather than one type of fiber.   Try to increase your intake of fiber through using high-fiber foods rather than fiber pills or supplements that contain small amounts of fiber.   The goal is to change the types of food eaten. Do not supplement your present diet with high-fiber foods, but replace foods in your present diet.   INCLUDE A VARIETY OF FIBER SOURCES  Replace refined and processed grains with whole grains, canned fruits with fresh fruits, and incorporate other fiber sources. White rice, white breads, and most bakery goods contain little or no fiber.   Brown whole-grain rice, buckwheat oats, and many fruits and vegetables are all good sources of fiber. These include: broccoli, Brussels sprouts, cabbage, cauliflower, beets, sweet potatoes, white potatoes (skin on), carrots, tomatoes, eggplant, squash, berries, fresh fruits, and dried fruits.   Cereals appear to be the richest source of fiber. Cereal fiber is found in whole grains and bran. Bran is the fiber-rich outer coat of cereal grain, which is largely removed in refining. In whole-grain cereals, the bran remains. In breakfast cereals, the largest amount of fiber is found in those with "bran" in their names. The fiber content is sometimes indicated on the label.   You may need to include additional fruits and vegetables each day.   In baking, for 1 cup white flour, you may use the following substitutions:   1 cup whole-wheat flour minus 2 tablespoons.   1/2 cup white flour plus 1/2 cup whole-wheat flour.   Polyps, Colon  A polyp is extra tissue that grows inside your body. Colon polyps grow  in the large intestine. The large intestine, also called the colon, is part of your digestive system. It is a long, hollow tube at the end of your digestive tract where your body makes and stores stool. Most polyps are not dangerous. They are benign. This means they are not cancerous. But over time, some types of polyps can turn into cancer. Polyps that are smaller than a pea are usually not harmful. But larger polyps could someday become or may already be cancerous. To be safe, doctors remove all polyps and test them.   PREVENTION There is not one sure way to prevent polyps. You might be able to lower your risk of getting them if you:  Eat more fruits and vegetables and less fatty food.   Do not smoke.   Avoid alcohol.   Exercise every day.   Lose weight if you are overweight.   Eating more calcium and folate can also lower your risk of getting polyps. Some foods that are rich in calcium are milk, cheese, and broccoli. Some  foods that are rich in folate are chickpeas, kidney beans, and spinach.   Hemorrhoids Hemorrhoids are dilated (enlarged) veins around the rectum. Sometimes clots will form in the veins. This makes them swollen and painful. These are called thrombosed hemorrhoids. Causes of hemorrhoids include:  Constipation.   Straining to have a bowel movement.   HEAVY LIFTING  HOME CARE INSTRUCTIONS  Eat a well balanced diet and drink 6 to 8 glasses of water every day to avoid constipation. You may also use a bulk laxative.   Avoid straining to have bowel movements.   Keep anal area dry and clean.   Do not use a donut shaped pillow or sit on the toilet for long periods. This increases blood pooling and pain.   Move your bowels when your body has the urge; this will require less straining and will decrease pain and pressure.

## 2016-08-11 ENCOUNTER — Ambulatory Visit (HOSPITAL_COMMUNITY): Payer: BLUE CROSS/BLUE SHIELD

## 2016-08-13 ENCOUNTER — Encounter (HOSPITAL_COMMUNITY): Payer: Self-pay | Admitting: Gastroenterology

## 2016-08-31 ENCOUNTER — Telehealth: Payer: Self-pay | Admitting: Gastroenterology

## 2016-08-31 ENCOUNTER — Other Ambulatory Visit: Payer: Self-pay

## 2016-08-31 DIAGNOSIS — K7030 Alcoholic cirrhosis of liver without ascites: Secondary | ICD-10-CM

## 2016-08-31 NOTE — Telephone Encounter (Signed)
RECALL FOR ABD ULTRASOUND 

## 2016-08-31 NOTE — Telephone Encounter (Signed)
Letter mailed to pt.  

## 2016-09-08 ENCOUNTER — Ambulatory Visit (HOSPITAL_COMMUNITY)
Admission: RE | Admit: 2016-09-08 | Discharge: 2016-09-08 | Disposition: A | Discharge status: Home / Self Care | Payer: BLUE CROSS/BLUE SHIELD | Source: Ambulatory Visit | Attending: Gastroenterology | Admitting: Gastroenterology

## 2016-09-08 DIAGNOSIS — K7030 Alcoholic cirrhosis of liver without ascites: Secondary | ICD-10-CM | POA: Diagnosis present

## 2016-09-08 DIAGNOSIS — K8020 Calculus of gallbladder without cholecystitis without obstruction: Secondary | ICD-10-CM | POA: Diagnosis not present

## 2016-09-22 ENCOUNTER — Ambulatory Visit (HOSPITAL_COMMUNITY): Payer: BLUE CROSS/BLUE SHIELD

## 2016-09-24 ENCOUNTER — Telehealth: Payer: Self-pay

## 2016-09-24 NOTE — Telephone Encounter (Signed)
Sent Dr. Oneida Alar pager.

## 2016-09-24 NOTE — Telephone Encounter (Signed)
OK TO TAKE ULTRAM LVM.

## 2016-09-24 NOTE — Telephone Encounter (Signed)
Pt called and said she had a tooth pulled today and was given a prescription for Ultram. She wants to know if it is OK with Dr. Oneida Alar for her to take that. Please advise!  Can leave VM at home.

## 2016-09-28 NOTE — Telephone Encounter (Signed)
PT is aware.

## 2016-09-29 ENCOUNTER — Other Ambulatory Visit (HOSPITAL_COMMUNITY): Payer: BLUE CROSS/BLUE SHIELD

## 2016-10-06 ENCOUNTER — Other Ambulatory Visit: Payer: Self-pay | Admitting: Gastroenterology

## 2016-10-09 ENCOUNTER — Encounter: Payer: Self-pay | Admitting: Family Medicine

## 2016-10-09 ENCOUNTER — Ambulatory Visit (INDEPENDENT_AMBULATORY_CARE_PROVIDER_SITE_OTHER): Payer: Commercial Managed Care - PPO | Admitting: Family Medicine

## 2016-10-09 VITALS — BP 108/70 | Ht 68.0 in | Wt 233.0 lb

## 2016-10-09 DIAGNOSIS — F321 Major depressive disorder, single episode, moderate: Secondary | ICD-10-CM | POA: Diagnosis not present

## 2016-10-09 MED ORDER — ESCITALOPRAM OXALATE 20 MG PO TABS: ORAL_TABLET | ORAL | 5 refills | Status: DC

## 2016-10-09 MED ORDER — BUPROPION HCL ER (SR) 150 MG PO TB12: ORAL_TABLET | ORAL | 5 refills | Status: DC

## 2016-10-09 NOTE — Progress Notes (Signed)
   Subjective:    Patient ID: Deanna Thompson, female    DOB: 11/13/1955, 61 y.o.   MRN: [id number]  Depression         This is a recurrent problem.  The current episode started more than 1 year ago.   The problem has been rapidly worsening since onset.  Associated symptoms include helplessness, hopelessness, irritable and sad.     The symptoms are aggravated by work stress, medication, social issues, family issues and medication withdrawal.  Compliance with treatment is good.  Back has been acting up more later  Good friend passsed away  Lost good friend  Dog had to be "put out"  No other concerns.  Pt having ongoing troubles   Has felt down an having more feelings of sadness  Backed down on wellbutrin in the past because of dizziness and lightheadedness  Exercise not good   Review of Systems  Psychiatric/Behavioral: Positive for depression.       Objective:   Physical Exam  Constitutional: She is irritable.  Alert and oriented, vitals reviewed and stable, NAD ENT-TM's and ext canals WNL bilat via otoscopic exam Soft palate, tonsils and post pharynx WNL via oropharyngeal exam Neck-symmetric, no masses; thyroid nonpalpable and nontender Pulmonary-no tachypnea or accessory muscle use; Clear without wheezes via auscultation Card--no abnrml murmurs, rhythm reg and rate WNL Carotid pulses symmetric, without bruits         Assessment & Plan:  Impression depression significantly worsening per patient. No suicidal thoughts. Just a lot of increased stress. Feels it a change needs to be made. Has been using a lower dose Wellbutrin but feels may need more. Very long discussion held about all patients symptoms and challenges. She sees a therapist weekly. Plan increase the Wellbutrin. Warning signs discussed.  Greater than 50% of this 25 minute face to face visit was spent in counseling and discussion and coordination of care regarding the above diagnosis/diagnosies

## 2016-11-02 ENCOUNTER — Ambulatory Visit: Payer: BLUE CROSS/BLUE SHIELD | Admitting: Family Medicine

## 2016-12-08 ENCOUNTER — Encounter: Payer: Self-pay | Admitting: Gastroenterology

## 2016-12-29 ENCOUNTER — Encounter (HOSPITAL_COMMUNITY): Payer: Commercial Managed Care - PPO | Attending: Oncology | Admitting: Oncology

## 2016-12-29 ENCOUNTER — Encounter (HOSPITAL_COMMUNITY): Payer: Commercial Managed Care - PPO

## 2016-12-29 ENCOUNTER — Encounter (HOSPITAL_COMMUNITY): Payer: Self-pay | Admitting: Oncology

## 2016-12-29 VITALS — BP 109/63 | HR 79 | Resp 16 | Ht 68.0 in | Wt 230.0 lb

## 2016-12-29 DIAGNOSIS — K922 Gastrointestinal hemorrhage, unspecified: Secondary | ICD-10-CM | POA: Diagnosis not present

## 2016-12-29 DIAGNOSIS — M3500 Sicca syndrome, unspecified: Secondary | ICD-10-CM | POA: Insufficient documentation

## 2016-12-29 DIAGNOSIS — Z87891 Personal history of nicotine dependence: Secondary | ICD-10-CM | POA: Insufficient documentation

## 2016-12-29 DIAGNOSIS — I10 Essential (primary) hypertension: Secondary | ICD-10-CM | POA: Diagnosis not present

## 2016-12-29 DIAGNOSIS — K219 Gastro-esophageal reflux disease without esophagitis: Secondary | ICD-10-CM | POA: Diagnosis not present

## 2016-12-29 DIAGNOSIS — F329 Major depressive disorder, single episode, unspecified: Secondary | ICD-10-CM | POA: Insufficient documentation

## 2016-12-29 DIAGNOSIS — K7460 Unspecified cirrhosis of liver: Secondary | ICD-10-CM | POA: Diagnosis not present

## 2016-12-29 DIAGNOSIS — Z23 Encounter for immunization: Secondary | ICD-10-CM

## 2016-12-29 DIAGNOSIS — K589 Irritable bowel syndrome without diarrhea: Secondary | ICD-10-CM | POA: Diagnosis not present

## 2016-12-29 DIAGNOSIS — E538 Deficiency of other specified B group vitamins: Secondary | ICD-10-CM | POA: Diagnosis not present

## 2016-12-29 DIAGNOSIS — D500 Iron deficiency anemia secondary to blood loss (chronic): Secondary | ICD-10-CM | POA: Diagnosis not present

## 2016-12-29 DIAGNOSIS — F410 Panic disorder [episodic paroxysmal anxiety] without agoraphobia: Secondary | ICD-10-CM | POA: Diagnosis not present

## 2016-12-29 LAB — BASIC METABOLIC PANEL
Anion gap: 11 (ref 5–15)
BUN: 18 mg/dL (ref 6–20)
CO2: 26 mmol/L (ref 22–32)
CREATININE: 1.37 mg/dL — AB (ref 0.44–1.00)
Calcium: 9.4 mg/dL (ref 8.9–10.3)
Chloride: 97 mmol/L — ABNORMAL LOW (ref 101–111)
GFR, EST AFRICAN AMERICAN: 48 mL/min — AB (ref >60)
GFR, EST NON AFRICAN AMERICAN: 41 mL/min — AB (ref >60)
Glucose, Bld: 160 mg/dL — ABNORMAL HIGH (ref 65–99)
POTASSIUM: 4.1 mmol/L (ref 3.5–5.1)
SODIUM: 134 mmol/L — AB (ref 135–145)

## 2016-12-29 LAB — CBC WITH DIFFERENTIAL/PLATELET
BASOS PCT: 1 %
Basophils Absolute: 0 10*3/uL (ref 0.0–0.1)
EOS ABS: 0.2 10*3/uL (ref 0.0–0.7)
Eosinophils Relative: 3 %
HCT: 40.4 % (ref 36.0–46.0)
HEMOGLOBIN: 13.6 g/dL (ref 12.0–15.0)
LYMPHS ABS: 1.7 10*3/uL (ref 0.7–4.0)
Lymphocytes Relative: 25 %
MCH: 31.1 pg (ref 26.0–34.0)
MCHC: 33.7 g/dL (ref 30.0–36.0)
MCV: 92.2 fL (ref 78.0–100.0)
Monocytes Absolute: 0.4 10*3/uL (ref 0.1–1.0)
Monocytes Relative: 5 %
NEUTROS PCT: 66 %
Neutro Abs: 4.5 10*3/uL (ref 1.7–7.7)
Platelets: 219 10*3/uL (ref 150–400)
RBC: 4.38 MIL/uL (ref 3.87–5.11)
RDW: 13.6 % (ref 11.5–15.5)
WBC: 6.8 10*3/uL (ref 4.0–10.5)

## 2016-12-29 LAB — FOLATE: Folate: 10.7 ng/mL (ref >5.9)

## 2016-12-29 LAB — IRON AND TIBC
Iron: 80 ug/dL (ref 28–170)
Saturation Ratios: 21 % (ref 10.4–31.8)
TIBC: 377 ug/dL (ref 250–450)
UIBC: 297 ug/dL

## 2016-12-29 LAB — FERRITIN: Ferritin: 34 ng/mL (ref 11–307)

## 2016-12-29 LAB — VITAMIN B12: VITAMIN B 12: 342 pg/mL (ref 180–914)

## 2016-12-29 MED ORDER — INFLUENZA VAC SPLIT QUAD 0.5 ML IM SUSY
0.5000 mL | PREFILLED_SYRINGE | Freq: Once | INTRAMUSCULAR | Status: AC
  Administered 2016-12-29: 0.5 mL via INTRAMUSCULAR
  Filled 2016-12-29: qty 0.5

## 2016-12-29 NOTE — Progress Notes (Signed)
Deanna Kirschner, MD [address] [place] [address]  No diagnosis found.  CURRENT THERAPY:  Oral B12 daily and IV iron when necessary.  INTERVAL HISTORY: [name] 61 y.o. 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  More recently, she underwent a flexible sigmoidoscopy with Dr. Oneida Alar on 01/21/2016 showing rectal bleeding due to proctitis and possibly internal hemorrhoids. AND B12 deficiency AND Cirrhosis of liver.  Patient presents today for continued follow up. She states she's been having new mild persistent nausea, which is unrelated to food. She does have a history of gallstones, but denies any recent abdominal pain. She denies any evidence of bleeding including melena, hematochezia, hematuria. She had a colonoscopy performed in May 2018 demonstrated redundant left colon, external and internal hemorrhoids, otherwise the rest of the colon was normal. She states she has not taken her oral iron for over 6 months now because it causes her severe constipation despite her being on linzess and colace daily. 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 She continues to have bursitis in her bilateral hips which causes her stiffness at times. She continues to have side effects from her sjogrens syndrome.  Review of Systems  Constitutional: Negative.  Negative for chills, fever and weight loss.  HENT: Negative.   Eyes: Negative.   Respiratory: Negative.  Negative for cough.   Cardiovascular: Negative.  Negative for chest pain.  Gastrointestinal: Negative.  Negative for blood in  stool, constipation, diarrhea, melena, nausea and vomiting.  Genitourinary: Negative.   Musculoskeletal: Negative.   Skin: Negative.   Neurological: Negative.  Negative for weakness.  Endo/Heme/Allergies: Negative.   Psychiatric/Behavioral: Negative.     Past Medical History:  Diagnosis Date  . Abdominal hernia   . Abscess of pulp of tooth 12/12/14   left side  . Anxiety   . Arthritis    back  and down flank  right side more than left  . B12 deficiency 06/12/2015  . Brain aneurysm    METAL COIL PREVENTS MRI IMAGING and CLIP  . Cirrhosis ([place])    Presumably alcoholic cirrhosis  . Complication of anesthesia   . Depression   . ETOH abuse    Quit in 07/2011   . Fatigue    due to Sjogren's disease  . Folate deficiency 06/30/2016  . GERD (gastroesophageal reflux disease)   . HTN (hypertension)    no longer medicated, dated on 04/25/12  . IBS (irritable bowel syndrome)   . Iron deficiency anemia due to chronic blood loss   . Myofascial pain   . Panic attack   . PONV (postoperative nausea and vomiting)   . Psoriatic arthritis ([place])   . Right inguinal hernia 08/16/2012  . Seizures ([place]) 1997   1 seizure from coil insertion but no more seizures and no meds for seizures  . Sjogren's disease ([place])   . [place] HEAD 02/03/2007   Qualifier: Diagnosis of  By: Aline Brochure MD, Dorothyann Peng    . 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([place])     Past Surgical History:  Procedure Laterality Date  . AGILE CAPSULE N/A 06/21/2014   Procedure:  AGILE CAPSULE;  Surgeon: Deanna Binder, MD;  [facility];  Service: Endoscopy;  Laterality: N/A;  0700  . BIOPSY  10/13/2011   SLF: mild gastritis/duodenal polypoid lesion   . CEREBRAL ANEURYSM REPAIR  1997  . COLONOSCOPY  10/13/2011   XTK:WIOXBDZH hemorrhoids/varices rectal/lesion in ascending colon(HYPERPLASTIC POLYP)  . COLONOSCOPY N/A 01/17/2013   SLF: 1. Internal hemorrhoids 2. Ascending colon polyps 3.  Rectal varices.   .  COLONOSCOPY N/A 08/10/2016   Procedure: COLONOSCOPY;  Surgeon: Deanna Binder, MD;  [facility];  Service: Endoscopy;  Laterality: N/A;  830   . ESOPHAGOGASTRODUODENOSCOPY N/A 06/29/2014   SLF: 1. anemia due to polypoid lesions in teh antrum, and duodenal polyps 2. single duodenum AVM  . FLEXIBLE SIGMOIDOSCOPY N/A 01/21/2016   Procedure: FLEXIBLE SIGMOIDOSCOPY;  Surgeon: Deanna Binder, MD;  [facility];  Service: Endoscopy;  Laterality: N/A;  245  . GIVENS CAPSULE STUDY N/A 06/29/2014   Procedure: GIVENS CAPSULE STUDY;  Surgeon: Deanna Binder, MD;  [facility];  Service: Endoscopy;  Laterality: N/A;  . GIVENS CAPSULE STUDY N/A 07/13/2014   Procedure: GIVENS CAPSULE STUDY;  Surgeon: Deanna Binder, MD;  [facility];  Service: Endoscopy;  Laterality: N/A;  700  . HEMORRHOID BANDING N/A 01/21/2016   Procedure: HEMORRHOID BANDING;  Surgeon: Deanna Binder, MD;  [facility];  Service: Endoscopy;  Laterality: N/A;  . INGUINAL HERNIA REPAIR Right 08/28/2013   Procedure: RIGHT NGUINAL HERNIORRHAPHY WITH MESH;  Surgeon: Deanna So, MD;  Location: [facility];  Service: General;  Laterality: Right;  . INSERTION OF MESH N/A 06/09/2013   Procedure: INSERTION OF MESH;  Surgeon: Deanna So, MD;  Location: [facility];  Service: General;  Laterality: N/A;  . INSERTION OF MESH Right 08/28/2013   Procedure: INSERTION OF MESH;  Surgeon: Deanna So, MD;  Location: [facility];  Service: General;  Laterality: Right;  . PARACENTESIS  Feb 2014   10.7 liters  . TOOTH EXTRACTION Left 12/12/14  . UMBILICAL HERNIA REPAIR N/A 06/09/2013   Procedure: UMBILICAL HERNIORRHAPHY WITH MESH;  Surgeon: Deanna So, MD;  Location: [facility];  Service: General;  Laterality: N/A;    Family History  Problem Relation Age of Onset  . Cirrhosis Mother        nash  . COPD Father   . Heart disease Father   . Asthma Unknown   . Heart disease Unknown   . Colon cancer Neg Hx   .  Inflammatory bowel disease Neg Hx     Social History   Social History  . Marital status: Married    Spouse name: N/A  . Number of children: 0  . Years of education: N/A   Occupational History  .  Retired   Social History Main Topics  . Smoking status: Former Smoker    Packs/day: 2.00    Years: 12.00    Types: Cigarettes    Quit date: 03/31/1995  . Smokeless tobacco: Never Used     Comment: quit in 1997  . Alcohol use No     Comment: Bottle of wine a day for years, but quit in 07/2011  . Drug use: No  . Sexual activity: Not Currently    Birth control/ protection: Post-menopausal   Other Topics Concern  . Not on file   Social History Narrative   Quit drinking alcohol 3 YEARS ago. No drugs, no IVDU.  PHYSICAL EXAMINATION  ECOG PERFORMANCE STATUS: 0 - Asymptomatic  Vitals:   12/29/16 1414  BP: 109/63  Pulse: 79  Resp: 16  SpO2: 99%    Constitutional: Well-developed, well-nourished, and in no distress.   HENT:  Head: Normocephalic and atraumatic.  Mouth/Throat: No oropharyngeal exudate. Mucosa moist. Eyes: Pupils are equal, round, and reactive to light. Conjunctivae are normal. No scleral icterus.  Neck: Normal range of motion. Neck supple. No JVD present.  Cardiovascular: Normal rate, regular rhythm and normal heart sounds.  Exam reveals no gallop and no friction rub.   No murmur heard. Pulmonary/Chest: Effort normal and breath sounds normal. No respiratory distress. No wheezes.No rales.  Abdominal: Soft. Bowel sounds are normal. No distension. There is no tenderness. There is no guarding.  Musculoskeletal: No edema or tenderness.  Lymphadenopathy:    No cervical or supraclavicular adenopathy.  Neurological: Alert and oriented to person, place, and time. No cranial nerve deficit.  Skin: Skin is warm, no rash noted. No erythema. No pallor. Skin is very dry. Psychiatric: Affect and judgment normal.     LABORATORY DATA: CBC    Component Value Date/Time     WBC 6.8 12/29/2016 1345   RBC 4.38 12/29/2016 1345   HGB 13.6 12/29/2016 1345   HCT 40.4 12/29/2016 1345   HCT 41 07/30/2011 0743   PLT 219 12/29/2016 1345   MCV 92.2 12/29/2016 1345   MCV 104.3 07/30/2011 0743   MCH 31.1 12/29/2016 1345   MCHC 33.7 12/29/2016 1345   RDW 13.6 12/29/2016 1345   LYMPHSABS 1.7 12/29/2016 1345   MONOABS 0.4 12/29/2016 1345   EOSABS 0.2 12/29/2016 1345   BASOSABS 0.0 12/29/2016 1345      Chemistry      Component Value Date/Time   NA 136 06/30/2016 1038   NA 138 07/30/2011 0741   K 4.1 06/30/2016 1038   K 3.8 07/30/2011 0741   CL 102 06/30/2016 1038   CO2 24 06/30/2016 1038   BUN 16 06/30/2016 1038   BUN 4 07/30/2011 0741   CREATININE 1.31 (H) 06/30/2016 1038   CREATININE 1.33 (H) 08/05/2015 1310      Component Value Date/Time   CALCIUM 9.9 06/30/2016 1038   ALKPHOS 76 06/30/2016 1038   ALKPHOS 111 07/30/2011 0741   AST 31 06/30/2016 1038   AST 171 07/30/2011 0741   ALT 22 06/30/2016 1038   BILITOT 0.7 06/30/2016 1038   BILITOT 3.6 07/30/2011 0741        PENDING LABS:   RADIOGRAPHIC STUDIES:  No results found.   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  More recently, she underwent a flexible sigmoidoscopy with Dr. Oneida Alar on 01/21/2016 showing rectal bleeding due to proctitis and possibly internal hemorrhoids.  Due to cost of IV iron replacement therapy, she is currently on PO iron in an attempt to reduce/eliminate future need for IV iron replacement treatment.    -Reviewed patient's CBC with her today. Hemoglobin is normal at 13. 6 g/dL. Iron and B12  levels are pending. -Patient is intolerant to oral iron which causes her severe constipation, therefore I have told her that we will  do IV iron replacement only from now on. -Continue oral B12. -Recommended for her to follow up with GI for her persistent nausea. -Will check her labs at 3 months and 6 months.  -RTC in 6 months for follow up.  Orders Placed This Encounter  Procedures  . CBC with Differential    Standing Status:   Standing    Number of Occurrences:   2    Standing Expiration Date:   12/29/2017  . Comprehensive metabolic panel    Standing Status:   Standing    Number of Occurrences:   2    Standing Expiration Date:   12/29/2017  . Iron and TIBC    Standing Status:   Standing    Number of Occurrences:   2    Standing Expiration Date:   12/29/2017  . Ferritin    Standing Status:   Standing    Number of Occurrences:   2    Standing Expiration Date:   12/29/2017  . Vitamin B12    Standing Status:   Standing    Number of Occurrences:   2    Standing Expiration Date:   12/29/2017     THERAPY PLAN:  Continue oral B12.  Will provide IV iron replacement when necessary.  All questions were answered. The patient knows to call the clinic with any problems, questions or concerns. We can certainly see the patient much sooner if necessary.   This note is electronically signed by: Twana First, MD 12/29/2016 2:09 PM

## 2016-12-29 NOTE — Progress Notes (Signed)
Pt given flu shot today. Pt given injection in right deltoid. Pt tolerated well. Pt stable and discharged home ambulatory.

## 2016-12-30 ENCOUNTER — Other Ambulatory Visit (HOSPITAL_COMMUNITY): Payer: Self-pay | Admitting: Adult Health

## 2017-01-04 ENCOUNTER — Other Ambulatory Visit: Payer: Self-pay | Admitting: Gastroenterology

## 2017-01-13 ENCOUNTER — Encounter (HOSPITAL_BASED_OUTPATIENT_CLINIC_OR_DEPARTMENT_OTHER): Payer: Commercial Managed Care - PPO

## 2017-01-13 ENCOUNTER — Encounter (HOSPITAL_COMMUNITY): Payer: Self-pay

## 2017-01-13 VITALS — BP 102/62 | HR 74 | Temp 98.5°F | Resp 18 | Wt 227.8 lb

## 2017-01-13 DIAGNOSIS — D500 Iron deficiency anemia secondary to blood loss (chronic): Secondary | ICD-10-CM

## 2017-01-13 MED ORDER — SODIUM CHLORIDE 0.9 % IV SOLN
510.0000 mg | Freq: Once | INTRAVENOUS | Status: AC
  Administered 2017-01-13: 510 mg via INTRAVENOUS
  Filled 2017-01-13: qty 17

## 2017-01-13 MED ORDER — SODIUM CHLORIDE 0.9 % IV SOLN
Freq: Once | INTRAVENOUS | Status: AC
  Administered 2017-01-13: 14:00:00 via INTRAVENOUS

## 2017-01-13 MED ORDER — SODIUM CHLORIDE 0.9% FLUSH
3.0000 mL | Freq: Once | INTRAVENOUS | Status: AC | PRN
  Administered 2017-01-13: 3 mL via INTRAVENOUS

## 2017-01-13 NOTE — Patient Instructions (Signed)
[facility] Discharge Instructions  RECOMMENDATIONS MADE BY THE CONSULTANT AND ANY TEST RESULTS WILL BE SENT TO YOUR REFERRING PHYSICIAN.  Received Feraheme infusion today.Follow-up as scheduled. Call clinic for any questions or concerns  Thank you for choosing [facility] at [facility] to provide your oncology and hematology care.  To afford each patient quality time with our provider, please arrive at least 15 minutes before your scheduled appointment time.    If you have a lab appointment with the Cancer Center please come in thru the  Main Entrance and check in at the main information desk  You need to re-schedule your appointment should you arrive 10 or more minutes late.  We strive to give you quality time with our providers, and arriving late affects you and other patients whose appointments are after yours.  Also, if you no show three or more times for appointments you may be dismissed from the clinic at the providers discretion.     Again, thank you for choosing [facility].  Our hope is that these requests will decrease the amount of time that you wait before being seen by our physicians.       _____________________________________________________________  Should you have questions after your visit to [facility], please contact our office at [phone] between the hours of 8:30 a.m. and 4:30 p.m.  Voicemails left after 4:30 p.m. will not be returned until the following business day.  For prescription refill requests, have your pharmacy contact our office.       Resources For Cancer Patients and their Caregivers ? American Cancer Society: Can assist with transportation, wigs, general needs, runs Look Good Feel Better.        1-888-227-6333 ? Cancer Care: Provides financial assistance, online support groups, medication/co-pay assistance.  1-800-813-HOPE (4673) ? [facility] Assists [place] cancer patients and their families through emotional , educational and financial support.  336-427-4357 ? [place] DSS Where to apply for food stamps, Medicaid and utility assistance. 336-342-1394 ? [facility]: Transportation to medical appointments. 336-347-2287 ? Social Security Administration: May apply for disability if have a Stage IV cancer. 336-342-7796 1-800-772-1213 ? [place] Aging, Disability and Transit Services: Assists with nutrition, care and transit needs. 336-349-2343  Cancer Center Support Programs: @10RELATIVEDAYS@ > Cancer Support Group  2nd Tuesday of the month 1pm-2pm, Journey Room  > Creative Journey  3rd Tuesday of the month 1130am-1pm, Journey Room  > Look Good Feel Better  1st Wednesday of the month 10am-12 noon, Journey Room (Call American Cancer Society to register 1-800-395-5775)   

## 2017-01-13 NOTE — Progress Notes (Signed)
Deanna Thompson tolerated Feraheme infusion well without complaints or incident. VSS upon discharge Pt reported mild dizziness with standing upon discharge so B/P rechecked 102/62 standing.Pt sat back down for a few minutes then got up slower B/P now 127/70. Pt feeling better and discharged self ambulatory in satisfactory condition

## 2017-01-18 ENCOUNTER — Telehealth: Payer: Self-pay | Admitting: Family Medicine

## 2017-01-18 ENCOUNTER — Telehealth: Payer: Self-pay | Admitting: General Practice

## 2017-01-18 DIAGNOSIS — M797 Fibromyalgia: Secondary | ICD-10-CM

## 2017-01-18 DIAGNOSIS — M329 Systemic lupus erythematosus, unspecified: Secondary | ICD-10-CM

## 2017-01-18 MED ORDER — MESALAMINE 4 G RE ENEM: 4.0000 g | ENEMA | Freq: Every day | RECTAL | 1 refills | Status: DC

## 2017-01-18 NOTE — Telephone Encounter (Signed)
Ok, what for?, let pt know more and more the rheum are requiring initail assessment and testing of rheum concerns to be done first by prim care before they wilol see, but happy to try

## 2017-01-18 NOTE — Telephone Encounter (Signed)
I have sent in Rowasa enemas, which she has taken previously (last year). Will see her tomorrow.

## 2017-01-18 NOTE — Telephone Encounter (Signed)
Patient called in complaining of abd bloating with pain, nausea and some "sloughing of the cells in her stools".  She feels like she felt when she had proctitis back in 2017.  She is going on vacation starting Saturday of this week and will be back November 3rd.  Patient is on the schedule to see Vicente Males tomorrow at 2:30

## 2017-01-18 NOTE — Addendum Note (Signed)
Addended by: Annitta Needs on: 01/18/2017 12:37 PM   Modules accepted: Orders

## 2017-01-18 NOTE — Telephone Encounter (Addendum)
Patient made aware and she stated she will wait to start the enemas until after she comes in for her ov.

## 2017-01-18 NOTE — Telephone Encounter (Signed)
I tried to call the patient, no answer lmom 

## 2017-01-18 NOTE — Telephone Encounter (Signed)
Patient is requesting a referral to rheumatologist.

## 2017-01-18 NOTE — Telephone Encounter (Signed)
REVIEWED. NEEDS ANOTHER COURSE OF MESALAMINE PR.

## 2017-01-19 ENCOUNTER — Encounter: Payer: Self-pay | Admitting: Gastroenterology

## 2017-01-19 ENCOUNTER — Ambulatory Visit (INDEPENDENT_AMBULATORY_CARE_PROVIDER_SITE_OTHER): Payer: Commercial Managed Care - PPO | Admitting: Gastroenterology

## 2017-01-19 VITALS — BP 156/97 | HR 81 | Temp 98.4°F | Ht 68.0 in | Wt 224.6 lb

## 2017-01-19 DIAGNOSIS — K7469 Other cirrhosis of liver: Secondary | ICD-10-CM

## 2017-01-19 DIAGNOSIS — K5900 Constipation, unspecified: Secondary | ICD-10-CM

## 2017-01-19 NOTE — Progress Notes (Signed)
Referring Provider: Mikey Kirschner, MD Primary Care Physician:  Mikey Kirschner, MD Primary GI: Dr. Oneida Alar   Chief Complaint  Patient presents with  . Abdominal Pain    improved  . Bloated    HPI:   Deanna Thompson is a 61 y.o. female presenting today with a history of constipation, cirrhosis, proctitis in 2017, last seen for routine office visit in April 2018. Colonoscopy recently completed May 2018 with redundant left colon, external and internal hemorrhoids. [place] last completed in June 2018 and due now.   Diagnosed with psoriatic arthritis, fibromyalgia, "way out of line lupus result",  stating her test result was extremely elevated. Going to see a new rheumatologist. BP elevated moreso than normal.   2-3 months ago would have an episode where she would have a panic attack "without negative thought flow". States she would have the need to defecate, get cold and nauseated, lasting 5 minutes and having to lay down. For the past 6 weeks, has had low level nausea, decreased appetite, aversion to food but still eats. Sinus drainage constantly, which she attributes to that.   Saturday: got up and felt very bloated. History of chronic constipation. Sjogren's syndrome. Takes Linzess 72 mcg and colace once or twice a day, which keeps her usually ok. Will occasionally only have a BM every 2-3 days. Will then have a catch-up BM.  Weight had gone up 4 lbs. Trying to lose weight. Yesterday had a "catch up stool". Still mildly nauseated for about a month and a half. Linzess 72 mcg. Still has to strain some. Sometimes feels like a stool is laying horizontally. States she saw "tissue flakes" one time, then called [place]. We had recommended Rowasa enemas due to history of proctitis, but she has seen no more issues and wants to hold off on this unless it recurs. Currently, she doesn't feel this is the same as when she had proctitis.   Feels a 4 out of 10 nausea all the time. 2-3 months ago increased  wellbutrin to 150 BID. Iron backs her up significantly. Taking ranitidine twice a day. Sometimes when getting close to dinner time, stomach will get upset again.    Past Medical History:  Diagnosis Date  . Abdominal hernia   . Abscess of pulp of tooth 12/12/14   left side  . Anxiety   . Arthritis    back  and down flank  right side more than left  . B12 deficiency 06/12/2015  . Brain aneurysm    METAL COIL PREVENTS MRI IMAGING and CLIP  . Cirrhosis ([place])    Presumably alcoholic cirrhosis  . Complication of anesthesia   . Depression   . ETOH abuse    Quit in 07/2011   . Fatigue    due to Sjogren's disease  . Folate deficiency 06/30/2016  . GERD (gastroesophageal reflux disease)   . HTN (hypertension)    no longer medicated, dated on 04/25/12  . IBS (irritable bowel syndrome)   . Iron deficiency anemia due to chronic blood loss   . Myofascial pain   . Panic attack   . PONV (postoperative nausea and vomiting)   . Psoriatic arthritis ([place])   . Right inguinal hernia 08/16/2012  . Seizures ([place]) 1997   1 seizure from coil insertion but no more seizures and no meds for seizures  . Sjogren's disease ([place])   . [place] HEAD 02/03/2007   Qualifier: Diagnosis of  By: Aline Brochure MD, Dorothyann Peng    .  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([place])     Past Surgical History:  Procedure Laterality Date  . AGILE CAPSULE N/A 06/21/2014   Procedure: AGILE CAPSULE;  Surgeon: Danie Binder, MD;  [facility];  Service: Endoscopy;  Laterality: N/A;  0700  . BIOPSY  10/13/2011   SLF: mild gastritis/duodenal polypoid lesion   . CEREBRAL ANEURYSM REPAIR  1997  . COLONOSCOPY  10/13/2011   TDV:VOHYWVPX hemorrhoids/varices rectal/lesion in ascending colon(HYPERPLASTIC POLYP)  . COLONOSCOPY N/A 01/17/2013   SLF: 1. Internal hemorrhoids 2. Ascending colon polyps 3.  Rectal varices.   . COLONOSCOPY N/A 08/10/2016   Dr. Oneida Alar: redundant left colon. external and internal hemorhroids. 5-10  year surveillance  . ESOPHAGOGASTRODUODENOSCOPY N/A 06/29/2014   SLF: 1. anemia due to polypoid lesions in teh antrum, and duodenal polyps 2. single duodenum AVM  . FLEXIBLE SIGMOIDOSCOPY N/A 01/21/2016   Procedure: FLEXIBLE SIGMOIDOSCOPY;  Surgeon: Danie Binder, MD;  [facility];  Service: Endoscopy;  Laterality: N/A;  245  . GIVENS CAPSULE STUDY N/A 06/29/2014   Procedure: GIVENS CAPSULE STUDY;  Surgeon: Danie Binder, MD;  [facility];  Service: Endoscopy;  Laterality: N/A;  . GIVENS CAPSULE STUDY N/A 07/13/2014   Procedure: GIVENS CAPSULE STUDY;  Surgeon: Danie Binder, MD;  [facility];  Service: Endoscopy;  Laterality: N/A;  700  . HEMORRHOID BANDING N/A 01/21/2016   Procedure: HEMORRHOID BANDING;  Surgeon: Danie Binder, MD;  [facility];  Service: Endoscopy;  Laterality: N/A;  . INGUINAL HERNIA REPAIR Right 08/28/2013   Procedure: RIGHT NGUINAL HERNIORRHAPHY WITH MESH;  Surgeon: Jamesetta So, MD;  Location: [facility];  Service: General;  Laterality: Right;  . INSERTION OF MESH N/A 06/09/2013   Procedure: INSERTION OF MESH;  Surgeon: Jamesetta So, MD;  Location: [facility];  Service: General;  Laterality: N/A;  . INSERTION OF MESH Right 08/28/2013   Procedure: INSERTION OF MESH;  Surgeon: Jamesetta So, MD;  Location: [facility];  Service: General;  Laterality: Right;  . PARACENTESIS  Feb 2014   10.7 liters  . TOOTH EXTRACTION Left 12/12/14  . UMBILICAL HERNIA REPAIR N/A 06/09/2013   Procedure: UMBILICAL HERNIORRHAPHY WITH MESH;  Surgeon: Jamesetta So, MD;  Location: [facility];  Service: General;  Laterality: N/A;    Current Outpatient Prescriptions  Medication Sig Dispense Refill  . acetaminophen (TYLENOL) 500 MG tablet Take 500 mg by mouth daily as needed for moderate pain.     Marland Kitchen amitriptyline (ELAVIL) 10 MG tablet TAKE (1) TABLET BY MOUTH AT BEDTIME. 90 tablet 0  . bumetanide (BUMEX) 1 MG tablet TAKE (1) TABLET BY MOUTH (2) TIMES DAILY. 180  tablet 3  . buPROPion (WELLBUTRIN SR) 150 MG 12 hr tablet TAKE (1) TABLET BY MOUTH TWICE DAILY. 60 tablet 5  . cetirizine (ZYRTEC) 10 MG tablet Take 10 mg by mouth as needed for allergies.    . cyanocobalamin 1000 MCG tablet Take 1,000 mcg by mouth daily.    . cycloSPORINE (RESTASIS) 0.05 % ophthalmic emulsion Place 1 drop into both eyes 2 (two) times daily.    Marland Kitchen docusate sodium (COLACE) 100 MG capsule Take 100 mg by mouth. 1-2 times per day    . escitalopram (LEXAPRO) 20 MG tablet TAKE (1) TABLET BY MOUTH AT BEDTIME. 30 tablet 5  . linaclotide (LINZESS) 72 MCG capsule Take 1 capsule (72 mcg total) by mouth daily before breakfast. 90 capsule 3  . Polyvinyl Alcohol-Povidone (  REFRESH OP) Place 1 drop into both eyes 3 (three) times daily as needed (dry eyes).    . ranitidine (ZANTAC) 150 MG tablet TAKE (1) TABLET BY MOUTH TWICE DAILY. 180 tablet 0  . sodium chloride (OCEAN) 0.65 % nasal spray Place 1 spray into the nose daily as needed for congestion.     Marland Kitchen spironolactone (ALDACTONE) 100 MG tablet TAKE (1) TABLET BY MOUTH TWICE DAILY. 180 tablet 0   No current facility-administered medications for this visit.     Allergies as of 01/19/2017 - Review Complete 01/19/2017  Allergen Reaction Noted  . Omeprazole Shortness Of Breath 08/19/2011  . Other Shortness Of Breath and Rash 01/09/2013  . Phenytoin Rash   . Keflex [cephalexin] Other (See Comments)   . Phenergan [promethazine hcl] Other (See Comments) 04/08/2012    Family History  Problem Relation Age of Onset  . Cirrhosis Mother        nash  . COPD Father   . Heart disease Father   . Asthma Unknown   . Heart disease Unknown   . Colon cancer Neg Hx   . Inflammatory bowel disease Neg Hx     Social History   Social History  . Marital status: Married    Spouse name: N/A  . Number of children: 0  . Years of education: N/A   Occupational History  .  Retired   Social History Main Topics  . Smoking status: Former Smoker     Packs/day: 2.00    Years: 12.00    Types: Cigarettes    Quit date: 03/31/1995  . Smokeless tobacco: Never Used     Comment: quit in 1997  . Alcohol use No     Comment: Bottle of wine a day for years, but quit in 07/2011  . Drug use: No  . Sexual activity: Not Currently    Birth control/ protection: Post-menopausal   Other Topics Concern  . None   Social History Narrative   Quit drinking alcohol 3 YEARS ago. No drugs, no IVDU.     Review of Systems: Gen: Denies fever, chills, anorexia. Denies fatigue, weakness, weight loss.  CV: Denies chest pain, palpitations, syncope, peripheral edema, and claudication. Resp: Denies dyspnea at rest, cough, wheezing, coughing up blood, and pleurisy. GI: see HPI  Derm: Denies rash, itching, dry skin Psych: Denies depression, anxiety, memory loss, confusion. No homicidal or suicidal ideation.  Heme: Denies bruising, bleeding, and enlarged lymph nodes.  Physical Exam: BP (!) 156/97   Pulse 81   Temp 98.4 F (36.9 C) (Oral)   Ht 5\' 8"  (1.727 m)   Wt 224 lb 9.6 oz (101.9 kg)   BMI 34.15 kg/m  General:   Alert and oriented. No distress noted. Pleasant and cooperative.  Head:  Normocephalic and atraumatic. Eyes:  Conjuctiva clear without scleral icterus. Mouth:  Oral mucosa pink and moist. Good dentition.  Abdomen:  +BS, soft, non-tender and non-distended. No rebound or guarding. No HSM or masses noted. Msk:  Symmetrical without gross deformities. Normal posture. Extremities:  Without edema. Neurologic:  Alert and  oriented x4 Psych:  Alert and cooperative. Normal mood and affect.

## 2017-01-19 NOTE — Patient Instructions (Addendum)
I would like to check your liver and kidney numbers today. I have ordered a routine ultrasound for your liver as well.   I have given samples of Linzess 145 micrograms to take once each day, 30 minutes before breakfast. Like we talked about, it's ok to wait until you get back from vacation. I would like to see how you do with better emptying.  If you have any further concerns before leaving or even while on vacation, please send a message in [place] or call.

## 2017-01-19 NOTE — Telephone Encounter (Signed)
Patient has history of arthritis, fibromyalgia and lupus and needs to get back with rheumatology. Referral ordered in EPIC. Patient notified.

## 2017-01-20 ENCOUNTER — Encounter: Payer: Self-pay | Admitting: Gastroenterology

## 2017-01-20 LAB — COMPLETE METABOLIC PANEL WITH GFR
AG RATIO: 1.4 (calc) (ref 1.0–2.5)
ALBUMIN MSPROF: 4.3 g/dL (ref 3.6–5.1)
ALKALINE PHOSPHATASE (APISO): 72 U/L (ref 33–130)
ALT: 10 U/L (ref 6–29)
AST: 7 U/L — AB (ref 10–35)
BUN / CREAT RATIO: 18 (calc) (ref 6–22)
BUN: 23 mg/dL (ref 7–25)
CO2: 28 mmol/L (ref 20–32)
CREATININE: 1.31 mg/dL — AB (ref 0.50–1.05)
Calcium: 9.6 mg/dL (ref 8.6–10.4)
Chloride: 99 mmol/L (ref 98–110)
GFR, Est African American: 52 mL/min/{1.73_m2} — ABNORMAL LOW (ref >=60)
GFR, Est Non African American: 44 mL/min/{1.73_m2} — ABNORMAL LOW (ref >=60)
GLOBULIN: 3 g/dL (ref 1.9–3.7)
Glucose, Bld: 95 mg/dL (ref 65–139)
POTASSIUM: 4 mmol/L (ref 3.5–5.3)
SODIUM: 137 mmol/L (ref 135–146)
Total Bilirubin: 0.4 mg/dL (ref 0.2–1.2)
Total Protein: 7.3 g/dL (ref 6.1–8.1)

## 2017-01-20 LAB — PROTIME-INR
INR: 1.1
PROTHROMBIN TIME: 11.3 s (ref 9.0–11.5)

## 2017-01-20 NOTE — Progress Notes (Signed)
Hi! Your creatinine is actually stable and similar to 6 months ago. Your liver numbers continue to look great! Protein level is good, as well. Your INR is normal. I hope you start feeling better and are doing well today!

## 2017-01-21 ENCOUNTER — Encounter: Payer: Self-pay | Admitting: Gastroenterology

## 2017-01-21 NOTE — Progress Notes (Signed)
cc'd to pcp 

## 2017-01-21 NOTE — Assessment & Plan Note (Signed)
Well compensated. US abdomen due now. Return in Nov 2018 as previously planned.

## 2017-01-21 NOTE — Assessment & Plan Note (Signed)
61 year old female with chronic constipation on Linzess 72 mcg daily and stool softener. Notes BM every few days, some straining, abdominal bloating. Reports of "tissue flakes" in stool on isolated occasion but no rectal bleeding: due to history of proctitis, Rowasa enemas had been recommended. She is asking to hold off on these for now, which we will do unless there are any clinical changes. For now, trial samples of Linzess 145 mcg. Check labs today. Keep upcoming already scheduled appt. Call with any changes or no improvement with this regimen.

## 2017-01-26 ENCOUNTER — Encounter: Payer: Self-pay | Admitting: Family Medicine

## 2017-02-03 ENCOUNTER — Ambulatory Visit: Payer: Commercial Managed Care - PPO | Admitting: Gastroenterology

## 2017-02-05 ENCOUNTER — Telehealth: Payer: Self-pay | Admitting: *Deleted

## 2017-02-05 ENCOUNTER — Encounter: Payer: Self-pay | Admitting: Nurse Practitioner

## 2017-02-05 ENCOUNTER — Ambulatory Visit (INDEPENDENT_AMBULATORY_CARE_PROVIDER_SITE_OTHER): Payer: Commercial Managed Care - PPO | Admitting: Nurse Practitioner

## 2017-02-05 ENCOUNTER — Encounter: Payer: Self-pay | Admitting: *Deleted

## 2017-02-05 VITALS — Temp 98.4°F | Ht 68.0 in | Wt 229.8 lb

## 2017-02-05 DIAGNOSIS — R300 Dysuria: Secondary | ICD-10-CM | POA: Diagnosis not present

## 2017-02-05 LAB — POCT URINALYSIS DIPSTICK
PH UA: 6 (ref 5.0–8.0)
Spec Grav, UA: 1.015 (ref 1.010–1.025)

## 2017-02-05 MED ORDER — CIPROFLOXACIN HCL 500 MG PO TABS: 500.0000 mg | ORAL_TABLET | Freq: Two times a day (BID) | ORAL | 0 refills | Status: DC

## 2017-02-05 NOTE — Telephone Encounter (Signed)
Spoke with pt and u/s appt is scheduled for 03/02/17 at 8:30am, arrival time 8:15am. NPO after midnight. Appt letter mailed to her

## 2017-02-05 NOTE — Patient Instructions (Signed)
AZO as directed for 48 hours then stop

## 2017-02-08 ENCOUNTER — Encounter: Payer: Self-pay | Admitting: Nurse Practitioner

## 2017-02-08 NOTE — Progress Notes (Signed)
Subjective: Presents for complaints of possible bladder infection for the past 3 days.  Slight fever.  Urgency, no frequency.  Dysuria.  Slight fever.  Occasional nausea no vomiting.  Has had some issues off and on with constipation.  No abdominal pain.  No vaginal discharge.  No flank or back pain.  No new sexual partners.  Taking fluids well.  Objective:   Temp 98.4 F (36.9 C) (Oral)   Ht 5\' 8"  (1.727 m)   Wt 229 lb 12.8 oz (104.2 kg)   BMI 34.94 kg/m  NAD.  Alert, oriented.  Lungs clear.  Heart regular rate and rhythm.  No CVA area tenderness.  Abdomen soft nondistended nontender. Results for orders placed or performed in visit on 02/05/17  Urine Culture  Result Value Ref Range   Urine Culture, Routine Preliminary report (A)    Organism ID, Bacteria Escherichia coli (A)   POCT urinalysis dipstick  Result Value Ref Range   Color, UA     Clarity, UA     Glucose, UA     Bilirubin, UA     Ketones, UA     Spec Grav, UA 1.015 1.010 - 1.025   Blood, UA small    pH, UA 6.0 5.0 - 8.0   Protein, UA     Urobilinogen, UA  0.2 or 1.0 E.U./dL   Nitrite, UA     Leukocytes, UA Small (1+) (A) Negative   Urine micro negative, urine very dilute.  Assessment:  Dysuria - Plan: POCT urinalysis dipstick, Urine Culture    Plan:   Meds ordered this encounter  Medications  . ciprofloxacin (CIPRO) 500 MG tablet    Sig: Take 1 tablet (500 mg total) 2 (two) times daily by mouth.    Dispense:  14 tablet    Refill:  0    Order Specific Question:   Supervising Provider    Answer:   Maggie Font   ACI for 48 hours then discontinue.  Warning signs reviewed.  Call back in 72 hours if no improvement, go to ED over the weekend if worse.  Urine culture pending.

## 2017-02-09 LAB — URINE CULTURE

## 2017-02-10 ENCOUNTER — Telehealth: Payer: Self-pay | Admitting: Family Medicine

## 2017-02-10 NOTE — Telephone Encounter (Signed)
Patient wants to go back to 100 mg instead 150 mg wellbutrin twice a day. She states having some side affects but didn't go into details been on this for two month stating too strong.

## 2017-02-10 NOTE — Telephone Encounter (Signed)
Ok may decr from 150 sr bid to 100 sr bid six mo worth

## 2017-02-10 NOTE — Telephone Encounter (Signed)
I believe this would be best for Dr. Richardson Landry to help manage when he returns on Thursday

## 2017-02-11 MED ORDER — BUPROPION HCL ER (SR) 100 MG PO TB12: 100.0000 mg | ORAL_TABLET | Freq: Two times a day (BID) | ORAL | 5 refills | Status: DC

## 2017-02-11 NOTE — Telephone Encounter (Signed)
Spoke with patient and informed her per Dr.Steve Luking- We are sending in Wellbutrin 100 SR BID and discontinuing Wellbutrin 150 SR BID. Patient verbalized understanding.

## 2017-02-16 ENCOUNTER — Telehealth: Payer: Self-pay | Admitting: Family Medicine

## 2017-02-16 NOTE — Telephone Encounter (Signed)
[facility] for pt Dr. Estanislado Pandy would like old rheumatology records to review for the treatment she received for lupus See note below    Graves, Williemae Area, CMA  Lisa Roca E  Can you contact the patient and have her obtain them? Dr. Estanislado Pandy needs to be able to review them.  Thanks!

## 2017-02-20 ENCOUNTER — Other Ambulatory Visit: Payer: Self-pay | Admitting: Gastroenterology

## 2017-02-22 ENCOUNTER — Telehealth: Payer: Self-pay | Admitting: Gastroenterology

## 2017-02-22 NOTE — Telephone Encounter (Signed)
[phone] patient called and stated that the linzess 140 that anna let her try is working great and would like a prescription sent to [facility]

## 2017-02-22 NOTE — Telephone Encounter (Signed)
Forwarding to refill box.  

## 2017-02-23 NOTE — Telephone Encounter (Signed)
Had patient sign release Pt states that Dr. Ouida Sills never treated her for lupus but states she had lab results that pointed in that direction

## 2017-02-24 MED ORDER — LINACLOTIDE 145 MCG PO CAPS: 145.0000 ug | ORAL_CAPSULE | Freq: Every day | ORAL | 11 refills | Status: DC

## 2017-02-24 NOTE — Addendum Note (Signed)
Addended by: Mahala Menghini on: 02/24/2017 11:40 AM   Modules accepted: Orders

## 2017-03-02 ENCOUNTER — Ambulatory Visit (HOSPITAL_COMMUNITY)
Admission: RE | Admit: 2017-03-02 | Discharge: 2017-03-02 | Disposition: A | Discharge status: Home / Self Care | Payer: Commercial Managed Care - PPO | Source: Ambulatory Visit | Attending: Gastroenterology | Admitting: Gastroenterology

## 2017-03-02 DIAGNOSIS — K7469 Other cirrhosis of liver: Secondary | ICD-10-CM | POA: Diagnosis not present

## 2017-03-02 DIAGNOSIS — K820 Obstruction of gallbladder: Secondary | ICD-10-CM | POA: Insufficient documentation

## 2017-03-03 NOTE — Progress Notes (Signed)
No HCC. Stable from prior imaging. Contracted gallbladder with stones but she is asymptomatic. Repeat in 6 months. Sent message in [place].

## 2017-03-04 ENCOUNTER — Encounter: Payer: Self-pay | Admitting: Gastroenterology

## 2017-03-04 ENCOUNTER — Ambulatory Visit (INDEPENDENT_AMBULATORY_CARE_PROVIDER_SITE_OTHER): Payer: Commercial Managed Care - PPO | Admitting: Gastroenterology

## 2017-03-04 DIAGNOSIS — K5901 Slow transit constipation: Secondary | ICD-10-CM | POA: Diagnosis not present

## 2017-03-04 DIAGNOSIS — K6289 Other specified diseases of anus and rectum: Secondary | ICD-10-CM | POA: Diagnosis not present

## 2017-03-04 DIAGNOSIS — K7030 Alcoholic cirrhosis of liver without ascites: Secondary | ICD-10-CM | POA: Diagnosis not present

## 2017-03-04 NOTE — Assessment & Plan Note (Signed)
SYMPTOMS FAIRLY WELL CONTROLLED.   DRINK WATER TO KEEP YOUR URINE LIGHT YELLOW. FOLLOW A HIGH FIBER DIET. AVOID ITEMS THAT CAUSE BLOATING & GAS.  HANDOUT GIVEN. ADD ONE COLACE once or twice daily. CONTINUE LINZESS.  FOLLOW UP IN 6 MOS.

## 2017-03-04 NOTE — Progress Notes (Signed)
Subjective:    Patient ID: Deanna Thompson, female    DOB: 15-Mar-1956, 61 y.o.   MRN: [id number]  Mikey Kirschner, MD  HPI Has had intermittent constipation and diarrhea: WATERY CRAMPS, THEN 2-3 NL BMs. DIAGNOSED WITH IBS YEARS AGO. LINZESS 145 MCG/COLACE ONCE A DAY HELPED REGULATE HER BOWELS BUT STILL NOT IDEAL. ONCE EVERY 7-10 DAYS WILL HAVE THE WATERY DIARRHEA. HAD BLOOD x2 WHEN SHE WIPED AFTER A SUPER TURD. NAUSEA: WHEN ANXIOUS. HEARTBURN CONTROLLED WITH RANITIDINE. INTENTIONAL WEIGHT LOSS: APR 2018-245 LBS AND NOW DEC 2018: 226 LBS.  PT DENIES FEVER, CHILLS, HEMATEMESIS, vomiting, melena, CHEST PAIN, SHORTNESS OF BREATH, CHANGE IN BOWEL IN HABITS,  abdominal pain, problems swallowing, problems with sedation, OR heartburn or indigestion.  Past Medical History:  Diagnosis Date  . Abdominal hernia   . Abscess of pulp of tooth 12/12/14   left side  . Anxiety   . Arthritis    back  and down flank  right side more than left  . B12 deficiency 06/12/2015  . Brain aneurysm    METAL COIL PREVENTS MRI IMAGING and CLIP  . Cirrhosis ([place])    Presumably alcoholic cirrhosis  . Complication of anesthesia   . Depression   . ETOH abuse    Quit in 07/2011   . Fatigue    due to Sjogren's disease  . Folate deficiency 06/30/2016  . GERD (gastroesophageal reflux disease)   . HTN (hypertension)    no longer medicated, dated on 04/25/12  . IBS (irritable bowel syndrome)   . Iron deficiency anemia due to chronic blood loss   . Myofascial pain   . Panic attack   . PONV (postoperative nausea and vomiting)   . Psoriatic arthritis ([place])   . Right inguinal hernia 08/16/2012  . Seizures ([place]) 1997   1 seizure from coil insertion but no more seizures and no meds for seizures  . Sjogren's disease ([place])   . [place] HEAD 02/03/2007   Qualifier: Diagnosis of  By: Aline Brochure MD, Dorothyann Peng    . 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([place])    Past Surgical History:  Procedure Laterality Date    . AGILE CAPSULE N/A 06/21/2014   Procedure: AGILE CAPSULE;  Surgeon: Danie Binder, MD;  [facility];  Service: Endoscopy;  Laterality: N/A;  0700  . BIOPSY  10/13/2011   SLF: mild gastritis/duodenal polypoid lesion   . CEREBRAL ANEURYSM REPAIR  1997  . COLONOSCOPY  10/13/2011   TDV:VOHYWVPX hemorrhoids/varices rectal/lesion in ascending colon(HYPERPLASTIC POLYP)  . COLONOSCOPY N/A 01/17/2013   SLF: 1. Internal hemorrhoids 2. Ascending colon polyps 3.  Rectal varices.   . COLONOSCOPY N/A 08/10/2016   Dr. Oneida Alar: redundant left colon. external and internal hemorhroids. 5-10 year surveillance  . ESOPHAGOGASTRODUODENOSCOPY N/A 06/29/2014   SLF: 1. anemia due to polypoid lesions in teh antrum, and duodenal polyps 2. single duodenum AVM  . FLEXIBLE SIGMOIDOSCOPY N/A 01/21/2016   Procedure: FLEXIBLE SIGMOIDOSCOPY;  Surgeon: Danie Binder, MD;  [facility];  Service: Endoscopy;  Laterality: N/A;  245  . GIVENS CAPSULE STUDY N/A 06/29/2014   Procedure: GIVENS CAPSULE STUDY;  Surgeon: Danie Binder, MD;  [facility];  Service: Endoscopy;  Laterality: N/A;  . GIVENS CAPSULE STUDY N/A 07/13/2014   Procedure: GIVENS CAPSULE STUDY;  Surgeon: Danie Binder, MD;  [facility];  Service: Endoscopy;  Laterality: N/A;  700  . HEMORRHOID BANDING N/A 01/21/2016   Procedure: HEMORRHOID BANDING;  Surgeon:  Danie Binder, MD;  [facility];  Service: Endoscopy;  Laterality: N/A;  . INGUINAL HERNIA REPAIR Right 08/28/2013   Procedure: RIGHT NGUINAL HERNIORRHAPHY WITH MESH;  Surgeon: Jamesetta So, MD;  Location: [facility];  Service: General;  Laterality: Right;  . INSERTION OF MESH N/A 06/09/2013   Procedure: INSERTION OF MESH;  Surgeon: Jamesetta So, MD;  Location: [facility];  Service: General;  Laterality: N/A;  . INSERTION OF MESH Right 08/28/2013   Procedure: INSERTION OF MESH;  Surgeon: Jamesetta So, MD;  Location: [facility];  Service: General;  Laterality: Right;   . PARACENTESIS  Feb 2014   10.7 liters  . TOOTH EXTRACTION Left 12/12/14  . UMBILICAL HERNIA REPAIR N/A 06/09/2013   Procedure: UMBILICAL HERNIORRHAPHY WITH MESH;  Surgeon: Jamesetta So, MD;  Location: [facility];  Service: General;  Laterality: N/A;   Allergies  Allergen Reactions  . Omeprazole Shortness Of Breath  . Other Shortness Of Breath and Rash    Red Meat Causes shortness of breath, rash and flu like symptoms.   . Phenytoin Rash  . Keflex [Cephalexin] Other (See Comments)    Severe yeast infection  . Phenergan [Promethazine Hcl] Other (See Comments)    Not in right state of mind, HALLUCINATION   Current Outpatient Medications  Medication Sig Dispense Refill  . acetaminophen (TYLENOL) 500 MG tablet Take 500 mg by mouth daily as needed for moderate pain.     Marland Kitchen amitriptyline (ELAVIL) 10 MG tablet TAKE (1) TABLET BY MOUTH AT BEDTIME.    . bumetanide (BUMEX) 1 MG tablet TAKE (1) TABLET BY MOUTH (2) TIMES DAILY.    . bumetanide (BUMEX) 1 MG tablet TAKE (1) TABLET BY MOUTH (2) TIMES DAILY.    Marland Kitchen buPROPion (WELLBUTRIN SR) 100 MG 12 hr tablet Take 1 tablet (100 mg total) 2 (two) times daily by mouth.    . cetirizine (ZYRTEC) 10 MG tablet Take 10 mg by mouth as needed for allergies.    . cyanocobalamin 1000 MCG tablet Take 1,000 mcg by mouth daily.    . cycloSPORINE (RESTASIS) 0.05 % ophthalmic emulsion Place 1 drop into both eyes 2 (two) times daily.    Marland Kitchen docusate sodium (COLACE) 100 MG capsule Take 100 mg by mouth. 1-2 times per day    . escitalopram (LEXAPRO) 20 MG tablet TAKE (1) TABLET BY MOUTH AT BEDTIME.    Marland Kitchen linaclotide (LINZESS) 145 MCG CAPS capsule Take 1 capsule (145 mcg total) by mouth daily before breakfast.    . Polyvinyl Alcohol-Povidone (REFRESH OP) Place 1 drop into both eyes 3 (three) times daily as needed (dry eyes).    . ranitidine (ZANTAC) 150 MG tablet TAKE (1) TABLET BY MOUTH TWICE DAILY.    . sodium chloride (OCEAN) 0.65 % nasal spray Place 1 spray into the nose  daily as needed for congestion.     Marland Kitchen spironolactone (ALDACTONE) 100 MG tablet TAKE (1) TABLET BY MOUTH TWICE DAILY.    Marland Kitchen       Review of Systems PER HPI OTHERWISE ALL SYSTEMS ARE NEGATIVE.    Objective:   Physical Exam  Constitutional: She is oriented to person, place, and time. She appears well-developed and well-nourished. No distress.  HENT:  Head: Normocephalic and atraumatic.  Mouth/Throat: Oropharynx is clear and moist. No oropharyngeal exudate.  Eyes: Pupils are equal, round, and reactive to light. No scleral icterus.  Neck: Normal range of motion. Neck supple.  Cardiovascular: Normal rate, regular rhythm and  normal heart sounds.  Pulmonary/Chest: Effort normal and breath sounds normal. No respiratory distress.  Abdominal: Soft. Bowel sounds are normal. She exhibits no distension. There is no tenderness.  Musculoskeletal: She exhibits no edema.  Lymphadenopathy:    She has no cervical adenopathy.  Neurological: She is alert and oriented to person, place, and time.  NO FOCAL DEFICITS  Psychiatric: She has a normal mood and affect.  Vitals reviewed.     Assessment & Plan:

## 2017-03-04 NOTE — Assessment & Plan Note (Addendum)
WELL COMPENSATED DISEASE.  LABS/US IN 6 MOS: CMP/CBC/PT/INR. FOLLOW UP IN 6 MOS.

## 2017-03-04 NOTE — Assessment & Plan Note (Signed)
SYMPTOMS CONTROLLED/RESOLVED AFTER 2 WEEKS MESALAMINE.  CONTINUE TO MONITOR SYMPTOMS.

## 2017-03-04 NOTE — Progress Notes (Signed)
CC'ED TO PCP 

## 2017-03-04 NOTE — Patient Instructions (Addendum)
DRINK WATER TO KEEP YOUR URINE LIGHT YELLOW.  FOLLOW A HIGH FIBER DIET. AVOID ITEMS THAT CAUSE BLOATING & GAS. SEE INFO BELOW.   ADD ONE COLACE once or twice daily.  CONTINUE LINZESS.   FOLLOW UP IN 6 MOS.   High-Fiber Diet A high-fiber diet changes your normal diet to include more whole grains, legumes, fruits, and vegetables. Changes in the diet involve replacing refined carbohydrates with unrefined foods. The calorie level of the diet is essentially unchanged. The Dietary Reference Intake (recommended amount) for adult males is 38 grams per day. For adult females, it is 25 grams per day. Pregnant and lactating women should consume 28 grams of fiber per day. Fiber is the intact part of a plant that is not broken down during digestion. Functional fiber is fiber that has been isolated from the plant to provide a beneficial effect in the body.  PURPOSE  Increase stool bulk.   Ease and regulate bowel movements.   Lower cholesterol.   REDUCE RISK OF COLON CANCER  INDICATIONS THAT YOU NEED MORE FIBER  Constipation and hemorrhoids.   Uncomplicated diverticulosis (intestine condition) and irritable bowel syndrome.   Weight management.   As a protective measure against hardening of the arteries (atherosclerosis), diabetes, and cancer.   GUIDELINES FOR INCREASING FIBER IN THE DIET  Start adding fiber to the diet slowly. A gradual increase of about 5 more grams (2 slices of whole-wheat bread, 2 servings of most fruits or vegetables, or 1 bowl of high-fiber cereal) per day is best. Too rapid an increase in fiber may result in constipation, flatulence, and bloating.   Drink enough water and fluids to keep your urine clear or pale yellow. Water, juice, or caffeine-free drinks are recommended. Not drinking enough fluid may cause constipation.   Eat a variety of high-fiber foods rather than one type of fiber.   Try to increase your intake of fiber through using high-fiber foods rather  than fiber pills or supplements that contain small amounts of fiber.   The goal is to change the types of food eaten. Do not supplement your present diet with high-fiber foods, but replace foods in your present diet.  INCLUDE A VARIETY OF FIBER SOURCES  Replace refined and processed grains with whole grains, canned fruits with fresh fruits, and incorporate other fiber sources. White rice, white breads, and most bakery goods contain little or no fiber.   Brown whole-grain rice, buckwheat oats, and many fruits and vegetables are all good sources of fiber. These include: broccoli, Brussels sprouts, cabbage, cauliflower, beets, sweet potatoes, white potatoes (skin on), carrots, tomatoes, eggplant, squash, berries, fresh fruits, and dried fruits.   Cereals appear to be the richest source of fiber. Cereal fiber is found in whole grains and bran. Bran is the fiber-rich outer coat of cereal grain, which is largely removed in refining. In whole-grain cereals, the bran remains. In breakfast cereals, the largest amount of fiber is found in those with "bran" in their names. The fiber content is sometimes indicated on the label.   You may need to include additional fruits and vegetables each day.   In baking, for 1 cup white flour, you may use the following substitutions:   1 cup whole-wheat flour minus 2 tablespoons.   1/2 cup white flour plus 1/2 cup whole-wheat flour.

## 2017-03-04 NOTE — Progress Notes (Signed)
Pt is aware.  

## 2017-03-05 NOTE — Progress Notes (Signed)
On recall  °

## 2017-03-30 DIAGNOSIS — I639 Cerebral infarction, unspecified: Secondary | ICD-10-CM

## 2017-03-30 HISTORY — DX: Cerebral infarction, unspecified: I63.9

## 2017-03-31 ENCOUNTER — Other Ambulatory Visit: Payer: Self-pay | Admitting: Gastroenterology

## 2017-04-02 ENCOUNTER — Inpatient Hospital Stay (HOSPITAL_COMMUNITY): Payer: Commercial Managed Care - PPO | Attending: Oncology

## 2017-04-02 ENCOUNTER — Other Ambulatory Visit (HOSPITAL_COMMUNITY): Payer: Self-pay | Admitting: Oncology

## 2017-04-02 DIAGNOSIS — D500 Iron deficiency anemia secondary to blood loss (chronic): Secondary | ICD-10-CM | POA: Diagnosis present

## 2017-04-02 LAB — BASIC METABOLIC PANEL
ANION GAP: 14 (ref 5–15)
BUN: 21 mg/dL — ABNORMAL HIGH (ref 6–20)
CALCIUM: 9.7 mg/dL (ref 8.9–10.3)
CO2: 22 mmol/L (ref 22–32)
Chloride: 99 mmol/L — ABNORMAL LOW (ref 101–111)
Creatinine, Ser: 1.22 mg/dL — ABNORMAL HIGH (ref 0.44–1.00)
GFR calc Af Amer: 55 mL/min — ABNORMAL LOW (ref >60)
GFR calc non Af Amer: 47 mL/min — ABNORMAL LOW (ref >60)
GLUCOSE: 106 mg/dL — AB (ref 65–99)
Potassium: 4.2 mmol/L (ref 3.5–5.1)
Sodium: 135 mmol/L (ref 135–145)

## 2017-04-02 LAB — CBC WITH DIFFERENTIAL/PLATELET
BASOS ABS: 0.1 10*3/uL (ref 0.0–0.1)
Basophils Relative: 1 %
Eosinophils Absolute: 0.3 10*3/uL (ref 0.0–0.7)
Eosinophils Relative: 4 %
HEMATOCRIT: 41.9 % (ref 36.0–46.0)
Hemoglobin: 14 g/dL (ref 12.0–15.0)
LYMPHS ABS: 1.3 10*3/uL (ref 0.7–4.0)
LYMPHS PCT: 17 %
MCH: 30.9 pg (ref 26.0–34.0)
MCHC: 33.4 g/dL (ref 30.0–36.0)
MCV: 92.5 fL (ref 78.0–100.0)
MONOS PCT: 9 %
Monocytes Absolute: 0.7 10*3/uL (ref 0.1–1.0)
NEUTROS ABS: 5.5 10*3/uL (ref 1.7–7.7)
Neutrophils Relative %: 71 %
Platelets: 277 10*3/uL (ref 150–400)
RBC: 4.53 MIL/uL (ref 3.87–5.11)
RDW: 13.7 % (ref 11.5–15.5)
WBC: 7.8 10*3/uL (ref 4.0–10.5)

## 2017-04-02 LAB — IRON AND TIBC
Iron: 75 ug/dL (ref 28–170)
SATURATION RATIOS: 19 % (ref 10.4–31.8)
TIBC: 399 ug/dL (ref 250–450)
UIBC: 324 ug/dL

## 2017-04-02 LAB — FERRITIN: Ferritin: 66 ng/mL (ref 11–307)

## 2017-04-08 ENCOUNTER — Other Ambulatory Visit: Payer: Self-pay

## 2017-04-08 ENCOUNTER — Encounter (HOSPITAL_COMMUNITY): Payer: Self-pay

## 2017-04-08 ENCOUNTER — Inpatient Hospital Stay (HOSPITAL_COMMUNITY): Payer: Commercial Managed Care - PPO

## 2017-04-08 VITALS — BP 135/62 | HR 68 | Temp 97.7°F | Resp 20 | Wt 227.8 lb

## 2017-04-08 DIAGNOSIS — D500 Iron deficiency anemia secondary to blood loss (chronic): Secondary | ICD-10-CM

## 2017-04-08 MED ORDER — SODIUM CHLORIDE 0.9 % IV SOLN
510.0000 mg | Freq: Once | INTRAVENOUS | Status: AC
  Administered 2017-04-08: 510 mg via INTRAVENOUS
  Filled 2017-04-08: qty 17

## 2017-04-08 MED ORDER — SODIUM CHLORIDE 0.9 % IV SOLN
Freq: Once | INTRAVENOUS | Status: AC
  Administered 2017-04-08: 14:00:00 via INTRAVENOUS

## 2017-04-08 NOTE — Progress Notes (Signed)
Tolerated infusion w/o adverse reaction.  Alert, in no distress.  VSS.  Discharged ambulatory.  

## 2017-04-13 ENCOUNTER — Encounter: Payer: Self-pay | Admitting: Family Medicine

## 2017-04-13 ENCOUNTER — Ambulatory Visit (INDEPENDENT_AMBULATORY_CARE_PROVIDER_SITE_OTHER): Payer: Commercial Managed Care - PPO | Admitting: Family Medicine

## 2017-04-13 VITALS — BP 110/68 | Ht 68.0 in | Wt 223.0 lb

## 2017-04-13 DIAGNOSIS — F5101 Primary insomnia: Secondary | ICD-10-CM

## 2017-04-13 DIAGNOSIS — I889 Nonspecific lymphadenitis, unspecified: Secondary | ICD-10-CM | POA: Diagnosis not present

## 2017-04-13 DIAGNOSIS — F3341 Major depressive disorder, recurrent, in partial remission: Secondary | ICD-10-CM | POA: Diagnosis not present

## 2017-04-13 MED ORDER — BUPROPION HCL ER (SR) 100 MG PO TB12: 100.0000 mg | ORAL_TABLET | Freq: Two times a day (BID) | ORAL | 5 refills | Status: DC

## 2017-04-13 MED ORDER — ESCITALOPRAM OXALATE 20 MG PO TABS: ORAL_TABLET | ORAL | 5 refills | Status: DC

## 2017-04-13 NOTE — Progress Notes (Signed)
   Subjective:    Patient ID: Deanna Thompson, female    DOB: May 06, 1955, 62 y.o.   MRN: [id number]  HPI Patient is here today to follow up on depression.Currently on Wellbutrin 100 one BID and Lexapro 20 mg one po Qhs.  See prior notes.  Patient did request higher dose Wellbutrin.  This really did not help a lot.  Fatigue at times.  Unfortunately not exercising  Patient though has noticed substantial improvement the last 2 weeks.  Stopped the amitriptyline.  Notes much less fogginess during the day.  Improved clarity of thought.  Also feels that it has helped her depression.  Some tender cervical glands over the past week.  10 days ago was treated with antibiotics for infection  Review of Systems No headache, no major weight loss or weight gain, no chest pain no back pain abdominal pain no change in bowel habits complete ROS otherwise negative     Objective:   Physical Exam  Alert vitals stable, NAD. Blood pressure good on repeat. HEENT normal. Lungs clear. Heart regular rate and rhythm.  Mild tender anterior cervical glands left greater than right   Impression 1 depression clinically improved and stable.  Will maintain same meds.  Diet exercise discussed  2.  Cervical lymphadenitis.  Mildly persistent though improving.  Post odontogenic infection recommend no further antibiotics at this time      Assessment & Plan:

## 2017-06-28 ENCOUNTER — Other Ambulatory Visit (HOSPITAL_COMMUNITY): Payer: Self-pay | Admitting: *Deleted

## 2017-06-28 DIAGNOSIS — D500 Iron deficiency anemia secondary to blood loss (chronic): Secondary | ICD-10-CM

## 2017-06-29 ENCOUNTER — Inpatient Hospital Stay (HOSPITAL_COMMUNITY): Payer: Commercial Managed Care - PPO | Attending: Internal Medicine

## 2017-06-29 DIAGNOSIS — D500 Iron deficiency anemia secondary to blood loss (chronic): Secondary | ICD-10-CM | POA: Diagnosis not present

## 2017-06-29 LAB — COMPREHENSIVE METABOLIC PANEL
ALBUMIN: 4.4 g/dL (ref 3.5–5.0)
ALK PHOS: 67 U/L (ref 38–126)
ALT: 16 U/L (ref 14–54)
AST: 24 U/L (ref 15–41)
Anion gap: 14 (ref 5–15)
BILIRUBIN TOTAL: 0.6 mg/dL (ref 0.3–1.2)
BUN: 18 mg/dL (ref 6–20)
CALCIUM: 9.9 mg/dL (ref 8.9–10.3)
CO2: 22 mmol/L (ref 22–32)
Chloride: 101 mmol/L (ref 101–111)
Creatinine, Ser: 1.19 mg/dL — ABNORMAL HIGH (ref 0.44–1.00)
GFR calc non Af Amer: 49 mL/min — ABNORMAL LOW (ref >60)
GFR, EST AFRICAN AMERICAN: 56 mL/min — AB (ref >60)
Glucose, Bld: 114 mg/dL — ABNORMAL HIGH (ref 65–99)
POTASSIUM: 4.1 mmol/L (ref 3.5–5.1)
SODIUM: 137 mmol/L (ref 135–145)
TOTAL PROTEIN: 8.2 g/dL — AB (ref 6.5–8.1)

## 2017-06-29 LAB — CBC WITH DIFFERENTIAL/PLATELET
BASOS ABS: 0 10*3/uL (ref 0.0–0.1)
BASOS PCT: 0 %
EOS ABS: 0.2 10*3/uL (ref 0.0–0.7)
EOS PCT: 2 %
HCT: 43.6 % (ref 36.0–46.0)
Hemoglobin: 14.4 g/dL (ref 12.0–15.0)
LYMPHS PCT: 16 %
Lymphs Abs: 1.2 10*3/uL (ref 0.7–4.0)
MCH: 30.8 pg (ref 26.0–34.0)
MCHC: 33 g/dL (ref 30.0–36.0)
MCV: 93.2 fL (ref 78.0–100.0)
Monocytes Absolute: 0.5 10*3/uL (ref 0.1–1.0)
Monocytes Relative: 7 %
Neutro Abs: 5.2 10*3/uL (ref 1.7–7.7)
Neutrophils Relative %: 75 %
PLATELETS: 223 10*3/uL (ref 150–400)
RBC: 4.68 MIL/uL (ref 3.87–5.11)
RDW: 13.5 % (ref 11.5–15.5)
WBC: 7 10*3/uL (ref 4.0–10.5)

## 2017-06-29 LAB — IRON AND TIBC
Iron: 73 ug/dL (ref 28–170)
Saturation Ratios: 18 % (ref 10.4–31.8)
TIBC: 399 ug/dL (ref 250–450)
UIBC: 326 ug/dL

## 2017-06-29 LAB — VITAMIN B12: Vitamin B-12: 317 pg/mL (ref 180–914)

## 2017-06-29 LAB — FERRITIN: Ferritin: 54 ng/mL (ref 11–307)

## 2017-07-02 ENCOUNTER — Other Ambulatory Visit: Payer: Self-pay | Admitting: Gastroenterology

## 2017-07-06 ENCOUNTER — Ambulatory Visit (HOSPITAL_COMMUNITY): Payer: Commercial Managed Care - PPO | Admitting: Hematology

## 2017-07-09 ENCOUNTER — Ambulatory Visit (HOSPITAL_COMMUNITY): Payer: Commercial Managed Care - PPO | Admitting: Internal Medicine

## 2017-07-14 ENCOUNTER — Ambulatory Visit (INDEPENDENT_AMBULATORY_CARE_PROVIDER_SITE_OTHER): Payer: Commercial Managed Care - PPO | Admitting: Gastroenterology

## 2017-07-14 ENCOUNTER — Encounter: Payer: Self-pay | Admitting: Gastroenterology

## 2017-07-14 DIAGNOSIS — D122 Benign neoplasm of ascending colon: Secondary | ICD-10-CM | POA: Diagnosis not present

## 2017-07-14 DIAGNOSIS — K635 Polyp of colon: Secondary | ICD-10-CM

## 2017-07-14 DIAGNOSIS — K6289 Other specified diseases of anus and rectum: Secondary | ICD-10-CM

## 2017-07-14 DIAGNOSIS — D500 Iron deficiency anemia secondary to blood loss (chronic): Secondary | ICD-10-CM | POA: Diagnosis not present

## 2017-07-14 DIAGNOSIS — K7030 Alcoholic cirrhosis of liver without ascites: Secondary | ICD-10-CM | POA: Diagnosis not present

## 2017-07-14 DIAGNOSIS — K648 Other hemorrhoids: Secondary | ICD-10-CM | POA: Diagnosis not present

## 2017-07-14 NOTE — Assessment & Plan Note (Signed)
WELL COMPENSATED DISEASE.  CONTINUE BUMEX/ALDACTONE. REPEAT ULTRASOUND IN JUN 2019. FOLLOW UP IN 6 MOS.

## 2017-07-14 NOTE — Assessment & Plan Note (Signed)
SYMPTOMS CONTROLLED/RESOLVED.  CONTINUE TO MONITOR SYMPTOMS. 

## 2017-07-14 NOTE — Assessment & Plan Note (Signed)
NO WARNING SIGNS/SYMPTOMS. TCS UTD.

## 2017-07-14 NOTE — Patient Instructions (Addendum)
I WILL SPEAK WITH DR. [name] AND DISCUSS THE IV IRON.  CONTINUE BUMEX/ALDACTONE.  REPEAT ULTRASOUND IN JUN 2019.  FOLLOW UP IN 6 MOS.

## 2017-07-14 NOTE — Progress Notes (Signed)
cc'ed to pcp °

## 2017-07-14 NOTE — Assessment & Plan Note (Signed)
NO BRBPR OR MELENA.  I WILL SPEAK WITH DR. [name] AND DISCUSS THE IVFE.

## 2017-07-14 NOTE — Progress Notes (Signed)
Subjective:    Patient ID: Deanna Thompson, female    DOB: [birth year], 62 y.o.   MRN: [id number]  Mikey Kirschner, MD  HPI Can't tolerate PO IRON DUE TO CONSTIPATION. WANTS TO KNOW IF SHE CAN GET IVFE EVERY 6 MOS BECAUSE SHE HAS TO PAY $300 OUT OF POCKET EACH TIME. USUALLY CONSTIPATION ONE DAY WILL GET SCREAMING DIARRHEA. TAKES COLACE(2)/LINZESS & IT WORKS FOR CONSTIPATION. LAST WEEK HAD DIARRHEA FOR 3 DAYS ASSOCIATED WITH SINUS DRAINAGE. BMs: USUALLY #4. WANTS TO KNOW IF SHE NEEDS A VITAMIN. LOST 40 LBS.  PT DENIES FEVER, CHILLS, HEMATOCHEZIA, HEMATEMESIS, nausea, vomiting, melena, CHEST PAIN, SHORTNESS OF BREATH,  CHANGE IN BOWEL IN HABITS, abdominal pain, problems swallowing, OR heartburn or indigestion.  Past Medical History:  Diagnosis Date  . Abdominal hernia   . Abscess of pulp of tooth 12/12/14   left side  . Anxiety   . Arthritis    back  and down flank  right side more than left  . B12 deficiency 06/12/2015  . Brain aneurysm    METAL COIL PREVENTS MRI IMAGING and CLIP  . Cirrhosis ([place])    Presumably alcoholic cirrhosis  . Complication of anesthesia   . Depression   . ETOH abuse    Quit in 07/2011   . Fatigue    due to Sjogren's disease  . Folate deficiency 06/30/2016  . GERD (gastroesophageal reflux disease)   . HTN (hypertension)    no longer medicated, dated on 04/25/12  . IBS (irritable bowel syndrome)   . Iron deficiency anemia due to chronic blood loss   . Myofascial pain   . Panic attack   . PONV (postoperative nausea and vomiting)   . Psoriatic arthritis ([place])   . Right inguinal hernia 08/16/2012  . Seizures ([place]) 1997   1 seizure from coil insertion but no more seizures and no meds for seizures  . Sjogren's disease ([place])   . [place] HEAD 02/03/2007   Qualifier: Diagnosis of  By: Aline Brochure MD, Dorothyann Peng    . 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([place])     Past Surgical History:  Procedure Laterality Date  . AGILE CAPSULE N/A 06/21/2014   Procedure: AGILE CAPSULE;  Surgeon: Danie Binder, MD;  [facility];  Service: Endoscopy;  Laterality: N/A;  0700  . BIOPSY  10/13/2011   SLF: mild gastritis/duodenal polypoid lesion   . CEREBRAL ANEURYSM REPAIR  1997  . COLONOSCOPY  10/13/2011   [id number] hemorrhoids/varices rectal/lesion in ascending colon(HYPERPLASTIC POLYP)  . COLONOSCOPY N/A 01/17/2013   SLF: 1. Internal hemorrhoids 2. Ascending colon polyps 3.  Rectal varices.   . COLONOSCOPY N/A 08/10/2016   Dr. Oneida Alar: redundant left colon. external and internal hemorhroids. 5-10 year surveillance  . ESOPHAGOGASTRODUODENOSCOPY N/A 06/29/2014   SLF: 1. anemia due to polypoid lesions in teh antrum, and duodenal polyps 2. single duodenum AVM  . FLEXIBLE SIGMOIDOSCOPY N/A 01/21/2016   Procedure: FLEXIBLE SIGMOIDOSCOPY;  Surgeon: Danie Binder, MD;  [facility];  Service: Endoscopy;  Laterality: N/A;  245  . GIVENS CAPSULE STUDY N/A 06/29/2014   Procedure: GIVENS CAPSULE STUDY;  Surgeon: Danie Binder, MD;  [facility];  Service: Endoscopy;  Laterality: N/A;  . GIVENS CAPSULE STUDY N/A 07/13/2014   Procedure: GIVENS CAPSULE STUDY;  Surgeon: Danie Binder, MD;  [facility];  Service: Endoscopy;  Laterality: N/A;  700  . HEMORRHOID BANDING N/A 01/21/2016   Procedure: HEMORRHOID BANDING;  Surgeon: Marga Melnick  Adrin Julian, MD;  [facility];  Service: Endoscopy;  Laterality: N/A;  . INGUINAL HERNIA REPAIR Right 08/28/2013   Procedure: RIGHT NGUINAL HERNIORRHAPHY WITH MESH;  Surgeon: Jamesetta So, MD;  Location: [facility];  Service: General;  Laterality: Right;  . INSERTION OF MESH N/A 06/09/2013   Procedure: INSERTION OF MESH;  Surgeon: Jamesetta So, MD;  Location: [facility];  Service: General;  Laterality: N/A;  . INSERTION OF MESH Right 08/28/2013   Procedure: INSERTION OF MESH;  Surgeon: Jamesetta So, MD;  Location: [facility];  Service: General;  Laterality: Right;  . PARACENTESIS  Feb 2014   10.7  liters  . TOOTH EXTRACTION Left 12/12/14  . UMBILICAL HERNIA REPAIR N/A 06/09/2013   Procedure: UMBILICAL HERNIORRHAPHY WITH MESH;  Surgeon: Jamesetta So, MD;  Location: [facility];  Service: General;  Laterality: N/A;   Allergies  Allergen Reactions  . Omeprazole Shortness Of Breath  . Other Shortness Of Breath and Rash    Red Meat Causes shortness of breath, rash and flu like symptoms.   . Phenytoin Rash  . Keflex [Cephalexin] Other (See Comments)    Severe yeast infection  . Phenergan [Promethazine Hcl] Other (See Comments)    Not in right state of mind, HALLUCINATION   Current Outpatient Medications  Medication Sig    . acetaminophen (TYLENOL) 500 MG tablet Take 500 mg by mouth daily as needed for moderate pain.     . bumetanide (BUMEX) 1 MG tablet TAKE (1) TABLET BY MOUTH (2) TIMES DAILY.    Marland Kitchen buPROPion (WELLBUTRIN SR) 100 MG 12 hr tablet Take 1 tablet (100 mg total) by mouth 2 (two) times daily.    . cetirizine (ZYRTEC) 10 MG tablet Take 10 mg by mouth as needed for allergies.    . cycloSPORINE (RESTASIS) 0.05 % ophthalmic emulsion Place 1 drop into both eyes 2 (two) times daily.    Marland Kitchen docusate sodium (COLACE) 100 MG capsule Take 100 mg by mouth. 1-2 times per day    . escitalopram (LEXAPRO) 20 MG tablet TAKE (1) TABLET BY MOUTH AT BEDTIME.    Marland Kitchen linaclotide (LINZESS) 145 MCG CAPS capsule Take 1 capsule (145 mcg total) by mouth daily before breakfast.    . Polyvinyl Alcohol-Povidone (REFRESH OP) Place 1 drop into both eyes 3 (three) times daily as needed (dry eyes).    . ranitidine (ZANTAC) 150 MG tablet TAKE (1) TABLET BY MOUTH TWICE DAILY.    . sodium chloride (OCEAN) 0.65 % nasal spray Place 1 spray into the nose daily as needed for congestion.     Marland Kitchen spironolactone (ALDACTONE) 100 MG tablet TAKE (1) TABLET BY MOUTH TWICE DAILY.    . cyanocobalamin 1000 MCG tablet Take 1,000 mcg by mouth daily.     Review of Systems PER HPI OTHERWISE ALL SYSTEMS ARE NEGATIVE.    Objective:    Physical Exam  Constitutional: She is oriented to person, place, and time. She appears well-developed and well-nourished. No distress.  HENT:  Head: Normocephalic and atraumatic.  Mouth/Throat: Oropharynx is clear and moist. No oropharyngeal exudate.  Eyes: Pupils are equal, round, and reactive to light. No scleral icterus.  Neck: Normal range of motion. Neck supple.  Cardiovascular: Normal rate, regular rhythm and normal heart sounds.  Pulmonary/Chest: Effort normal and breath sounds normal. No respiratory distress.  Abdominal: Soft. Bowel sounds are normal. She exhibits no distension. There is no tenderness.  Musculoskeletal: She exhibits no edema.  Lymphadenopathy:    She  has no cervical adenopathy.  Neurological: She is alert and oriented to person, place, and time.  NO FOCAL DEFICITS  Psychiatric: She has a normal mood and affect.  Vitals reviewed.     Assessment & Plan:

## 2017-07-15 ENCOUNTER — Other Ambulatory Visit (HOSPITAL_COMMUNITY): Payer: Self-pay | Admitting: Hematology

## 2017-07-15 NOTE — Progress Notes (Signed)
Dr. Oneida Alar called me to inform that patient is concerned about getting iron infusions every 3 months as she had to pay a lot of co-pay.  I have reviewed her labs.  Her hemoglobin is 14.  Ferritin is around 40.  As her hemoglobin is normal, I have told Dr. Oneida Alar that her iron infusion can be spread out to once every 6 months.  She also has mild chronic kidney disease.  No clear evidence of bleeding, although history of AVMs in the small bowel.

## 2017-07-15 NOTE — Progress Notes (Signed)
SPOKE TO DR. KATRAGADDA. DISCUSSED PT'S CARE. OK TO HAVE IVFE Q6 MOS AS LONG AS Hb IS NL. PT AWARE. PT VOICED HER UNDERSTANDING.

## 2017-07-15 NOTE — Progress Notes (Signed)
ON RECALL  °

## 2017-07-23 ENCOUNTER — Telehealth: Payer: Self-pay | Admitting: Gastroenterology

## 2017-07-23 NOTE — Telephone Encounter (Signed)
Please call patient, wanting to know if she should drop her linzess dose down to ease her abdominal  distress.  Said to call her next week.

## 2017-07-23 NOTE — Telephone Encounter (Signed)
Spoke with pt. She continues to have 4 bowel movements off and on daily since she started Linzess. Pt was seen 07/14/17. Pt doesn't have severe abdominal pain but feels she has some abdominal disstress. Pt wasn't sure if she had a virus at her last appointment but has continued to go through the same thing. Pt states that she is ok if she's suppose to have this many bowel movements in a soft almost diarrhea form but wanted some guidance on if is she should cut back on the dosage. Pt says she doesn't have a bowel movement without the medication and she still has Linzess 72 at home as well.

## 2017-07-29 ENCOUNTER — Telehealth: Payer: Self-pay | Admitting: Gastroenterology

## 2017-07-29 NOTE — Telephone Encounter (Signed)
Called pt and not at home. Left message that I will check with Dr. Oneida Alar.

## 2017-07-29 NOTE — Telephone Encounter (Signed)
PATIENT WANTS TO KNOW IF YOU CAN FIND OUT WHO THE DOCTORS WERE AT [facility] THAT TOOK CARE OF HER SEVERAL YEARS AGO WHEN HER LIVER ISSUES STARTED.

## 2017-07-31 ENCOUNTER — Observation Stay (HOSPITAL_COMMUNITY)
Admission: EM | Admit: 2017-07-31 | Discharge: 2017-08-02 | Disposition: A | Discharge status: Home / Self Care | Payer: Commercial Managed Care - PPO | Attending: Family Medicine | Admitting: Family Medicine

## 2017-07-31 ENCOUNTER — Encounter (HOSPITAL_COMMUNITY): Payer: Self-pay

## 2017-07-31 ENCOUNTER — Emergency Department (HOSPITAL_COMMUNITY): Payer: Commercial Managed Care - PPO

## 2017-07-31 DIAGNOSIS — Z87891 Personal history of nicotine dependence: Secondary | ICD-10-CM | POA: Insufficient documentation

## 2017-07-31 DIAGNOSIS — H8190 Unspecified disorder of vestibular function, unspecified ear: Secondary | ICD-10-CM

## 2017-07-31 DIAGNOSIS — F1010 Alcohol abuse, uncomplicated: Secondary | ICD-10-CM | POA: Diagnosis not present

## 2017-07-31 DIAGNOSIS — N183 Chronic kidney disease, stage 3 unspecified: Secondary | ICD-10-CM | POA: Diagnosis present

## 2017-07-31 DIAGNOSIS — H8110 Benign paroxysmal vertigo, unspecified ear: Secondary | ICD-10-CM

## 2017-07-31 DIAGNOSIS — I129 Hypertensive chronic kidney disease with stage 1 through stage 4 chronic kidney disease, or unspecified chronic kidney disease: Secondary | ICD-10-CM | POA: Diagnosis not present

## 2017-07-31 DIAGNOSIS — K7030 Alcoholic cirrhosis of liver without ascites: Secondary | ICD-10-CM

## 2017-07-31 DIAGNOSIS — F419 Anxiety disorder, unspecified: Secondary | ICD-10-CM | POA: Diagnosis not present

## 2017-07-31 DIAGNOSIS — R42 Dizziness and giddiness: Secondary | ICD-10-CM | POA: Diagnosis not present

## 2017-07-31 DIAGNOSIS — I447 Left bundle-branch block, unspecified: Secondary | ICD-10-CM | POA: Diagnosis not present

## 2017-07-31 DIAGNOSIS — K219 Gastro-esophageal reflux disease without esophagitis: Secondary | ICD-10-CM | POA: Diagnosis present

## 2017-07-31 DIAGNOSIS — Z79899 Other long term (current) drug therapy: Secondary | ICD-10-CM | POA: Insufficient documentation

## 2017-07-31 DIAGNOSIS — F329 Major depressive disorder, single episode, unspecified: Secondary | ICD-10-CM | POA: Diagnosis not present

## 2017-07-31 DIAGNOSIS — I10 Essential (primary) hypertension: Secondary | ICD-10-CM | POA: Diagnosis not present

## 2017-07-31 DIAGNOSIS — R112 Nausea with vomiting, unspecified: Secondary | ICD-10-CM | POA: Diagnosis not present

## 2017-07-31 DIAGNOSIS — N1832 Chronic kidney disease, stage 3b: Secondary | ICD-10-CM | POA: Diagnosis present

## 2017-07-31 DIAGNOSIS — I639 Cerebral infarction, unspecified: Secondary | ICD-10-CM | POA: Diagnosis present

## 2017-07-31 DIAGNOSIS — K7460 Unspecified cirrhosis of liver: Secondary | ICD-10-CM | POA: Diagnosis present

## 2017-07-31 DIAGNOSIS — I671 Cerebral aneurysm, nonruptured: Secondary | ICD-10-CM | POA: Diagnosis present

## 2017-07-31 DIAGNOSIS — Z8673 Personal history of transient ischemic attack (TIA), and cerebral infarction without residual deficits: Secondary | ICD-10-CM | POA: Diagnosis present

## 2017-07-31 LAB — COMPREHENSIVE METABOLIC PANEL
ALT: 17 U/L (ref 14–54)
ANION GAP: 17 — AB (ref 5–15)
AST: 22 U/L (ref 15–41)
Albumin: 4.2 g/dL (ref 3.5–5.0)
Alkaline Phosphatase: 65 U/L (ref 38–126)
BUN: 20 mg/dL (ref 6–20)
CHLORIDE: 103 mmol/L (ref 101–111)
CO2: 20 mmol/L — ABNORMAL LOW (ref 22–32)
CREATININE: 1.26 mg/dL — AB (ref 0.44–1.00)
Calcium: 9.5 mg/dL (ref 8.9–10.3)
GFR calc non Af Amer: 45 mL/min — ABNORMAL LOW (ref >60)
GFR, EST AFRICAN AMERICAN: 53 mL/min — AB (ref >60)
Glucose, Bld: 159 mg/dL — ABNORMAL HIGH (ref 65–99)
Potassium: 3.6 mmol/L (ref 3.5–5.1)
Sodium: 140 mmol/L (ref 135–145)
Total Bilirubin: 1.1 mg/dL (ref 0.3–1.2)
Total Protein: 7.7 g/dL (ref 6.5–8.1)

## 2017-07-31 LAB — I-STAT TROPONIN, ED
Troponin i, poc: 0.01 ng/mL (ref 0.00–0.08)
Troponin i, poc: 0 ng/mL (ref 0.00–0.08)

## 2017-07-31 LAB — CBC
HCT: 41 % (ref 36.0–46.0)
Hemoglobin: 13.8 g/dL (ref 12.0–15.0)
MCH: 30.9 pg (ref 26.0–34.0)
MCHC: 33.7 g/dL (ref 30.0–36.0)
MCV: 91.7 fL (ref 78.0–100.0)
Platelets: 207 10*3/uL (ref 150–400)
RBC: 4.47 MIL/uL (ref 3.87–5.11)
RDW: 13.4 % (ref 11.5–15.5)
WBC: 10.8 10*3/uL — AB (ref 4.0–10.5)

## 2017-07-31 LAB — I-STAT BETA HCG BLOOD, ED (MC, WL, AP ONLY): I-stat hCG, quantitative: <5 m[IU]/mL

## 2017-07-31 LAB — LIPASE, BLOOD: Lipase: 37 U/L (ref 11–51)

## 2017-07-31 MED ORDER — DIAZEPAM 5 MG/ML IJ SOLN
2.5000 mg | Freq: Once | INTRAMUSCULAR | Status: AC
  Administered 2017-07-31: 2.5 mg via INTRAVENOUS
  Filled 2017-07-31: qty 2

## 2017-07-31 MED ORDER — HYDROXYZINE HCL 10 MG PO TABS: 10.0000 mg | ORAL_TABLET | Freq: Three times a day (TID) | ORAL | Status: DC | PRN

## 2017-07-31 MED ORDER — POLYVINYL ALCOHOL 1.4 % OP SOLN: 1.0000 [drp] | Freq: Three times a day (TID) | OPHTHALMIC | Status: DC | PRN

## 2017-07-31 MED ORDER — LORAZEPAM 2 MG/ML IJ SOLN
0.5000 mg | Freq: Once | INTRAMUSCULAR | Status: AC
  Administered 2017-07-31: 0.5 mg via INTRAVENOUS
  Filled 2017-07-31: qty 1

## 2017-07-31 MED ORDER — ENOXAPARIN SODIUM 40 MG/0.4ML ~~LOC~~ SOLN
40.0000 mg | SUBCUTANEOUS | Status: DC
  Administered 2017-08-01 – 2017-08-02 (×2): 40 mg via SUBCUTANEOUS
  Filled 2017-07-31 (×3): qty 0.4

## 2017-07-31 MED ORDER — SPIRONOLACTONE 100 MG PO TABS
100.0000 mg | ORAL_TABLET | Freq: Two times a day (BID) | ORAL | Status: DC
  Administered 2017-08-01 – 2017-08-02 (×3): 100 mg via ORAL
  Filled 2017-07-31: qty 4
  Filled 2017-07-31: qty 1
  Filled 2017-07-31: qty 4
  Filled 2017-07-31 (×3): qty 1

## 2017-07-31 MED ORDER — BUMETANIDE 1 MG PO TABS
1.0000 mg | ORAL_TABLET | Freq: Two times a day (BID) | ORAL | Status: DC
  Administered 2017-08-01 – 2017-08-02 (×3): 1 mg via ORAL
  Filled 2017-07-31 (×4): qty 1

## 2017-07-31 MED ORDER — ESCITALOPRAM OXALATE 20 MG PO TABS
20.0000 mg | ORAL_TABLET | Freq: Every day | ORAL | Status: DC
  Administered 2017-08-01 (×2): 20 mg via ORAL
  Filled 2017-07-31 (×3): qty 1
  Filled 2017-07-31: qty 2

## 2017-07-31 MED ORDER — FLUTICASONE PROPIONATE 50 MCG/ACT NA SUSP
1.0000 | Freq: Every day | NASAL | Status: DC
  Administered 2017-08-01: 1 via NASAL
  Filled 2017-07-31: qty 16

## 2017-07-31 MED ORDER — ATORVASTATIN CALCIUM 80 MG PO TABS
80.0000 mg | ORAL_TABLET | Freq: Every day | ORAL | Status: DC
  Administered 2017-08-01: 80 mg via ORAL
  Filled 2017-07-31 (×2): qty 1

## 2017-07-31 MED ORDER — DOCUSATE SODIUM 100 MG PO CAPS
100.0000 mg | ORAL_CAPSULE | Freq: Two times a day (BID) | ORAL | Status: DC
  Administered 2017-08-01 – 2017-08-02 (×4): 100 mg via ORAL
  Filled 2017-07-31 (×4): qty 1

## 2017-07-31 MED ORDER — MECLIZINE HCL 25 MG PO TABS
25.0000 mg | ORAL_TABLET | Freq: Once | ORAL | Status: AC
  Administered 2017-07-31: 25 mg via ORAL
  Filled 2017-07-31: qty 1

## 2017-07-31 MED ORDER — LIFITEGRAST 5 % OP SOLN
1.0000 [drp] | Freq: Two times a day (BID) | OPHTHALMIC | Status: DC
  Filled 2017-07-31: qty 5

## 2017-07-31 MED ORDER — HYDRALAZINE HCL 20 MG/ML IJ SOLN: 5.0000 mg | INTRAMUSCULAR | Status: DC | PRN

## 2017-07-31 MED ORDER — ADULT MULTIVITAMIN W/MINERALS CH
1.0000 | ORAL_TABLET | Freq: Every day | ORAL | Status: DC
  Administered 2017-08-01 – 2017-08-02 (×2): 1 via ORAL
  Filled 2017-07-31 (×2): qty 1

## 2017-07-31 MED ORDER — LORATADINE 10 MG PO TABS
10.0000 mg | ORAL_TABLET | Freq: Every day | ORAL | Status: DC
  Administered 2017-08-01 – 2017-08-02 (×2): 10 mg via ORAL
  Filled 2017-07-31 (×2): qty 1

## 2017-07-31 MED ORDER — ZOLPIDEM TARTRATE 5 MG PO TABS: 5.0000 mg | ORAL_TABLET | Freq: Every evening | ORAL | Status: DC | PRN

## 2017-07-31 MED ORDER — MECLIZINE HCL 12.5 MG PO TABS: 25.0000 mg | ORAL_TABLET | Freq: Three times a day (TID) | ORAL | Status: DC | PRN

## 2017-07-31 MED ORDER — MECLIZINE HCL 25 MG PO TABS: 25.0000 mg | ORAL_TABLET | Freq: Three times a day (TID) | ORAL | 0 refills | Status: DC | PRN

## 2017-07-31 MED ORDER — FAMOTIDINE 20 MG PO TABS
20.0000 mg | ORAL_TABLET | Freq: Two times a day (BID) | ORAL | Status: DC
  Administered 2017-08-01 – 2017-08-02 (×4): 20 mg via ORAL
  Filled 2017-07-31 (×4): qty 1

## 2017-07-31 MED ORDER — ACETAMINOPHEN 325 MG PO TABS
650.0000 mg | ORAL_TABLET | Freq: Four times a day (QID) | ORAL | Status: DC | PRN
Start: 2017-07-31 — End: 2017-08-02
  Administered 2017-08-01 (×2): 650 mg via ORAL
  Filled 2017-07-31 (×2): qty 2

## 2017-07-31 MED ORDER — SALINE SPRAY 0.65 % NA SOLN: 1.0000 | Freq: Two times a day (BID) | NASAL | Status: DC | PRN

## 2017-07-31 MED ORDER — HYDROCORTISONE 1 % EX CREA: 1.0000 "application " | TOPICAL_CREAM | Freq: Two times a day (BID) | CUTANEOUS | Status: DC | PRN

## 2017-07-31 MED ORDER — SODIUM CHLORIDE 0.9 % IV SOLN
INTRAVENOUS | Status: DC
  Administered 2017-08-01: via INTRAVENOUS

## 2017-07-31 MED ORDER — SIMETHICONE 80 MG PO CHEW: 80.0000 mg | CHEWABLE_TABLET | Freq: Two times a day (BID) | ORAL | Status: DC | PRN

## 2017-07-31 MED ORDER — IOPAMIDOL (ISOVUE-370) INJECTION 76%
50.0000 mL | Freq: Once | INTRAVENOUS | Status: AC | PRN
  Administered 2017-07-31: 50 mL via INTRAVENOUS

## 2017-07-31 MED ORDER — IOPAMIDOL (ISOVUE-370) INJECTION 76%
INTRAVENOUS | Status: AC
Start: 2017-07-31 — End: 2017-08-01
  Filled 2017-07-31: qty 50

## 2017-07-31 MED ORDER — SENNOSIDES-DOCUSATE SODIUM 8.6-50 MG PO TABS: 1.0000 | ORAL_TABLET | Freq: Every evening | ORAL | Status: DC | PRN

## 2017-07-31 MED ORDER — SODIUM CHLORIDE 0.9 % IV BOLUS
1000.0000 mL | Freq: Once | INTRAVENOUS | Status: AC
  Administered 2017-07-31: 1000 mL via INTRAVENOUS

## 2017-07-31 MED ORDER — BUPROPION HCL ER (SR) 100 MG PO TB12
100.0000 mg | ORAL_TABLET | Freq: Two times a day (BID) | ORAL | Status: DC
  Administered 2017-08-01 – 2017-08-02 (×4): 100 mg via ORAL
  Filled 2017-07-31 (×5): qty 1

## 2017-07-31 MED ORDER — ONDANSETRON HCL 4 MG PO TABS: 4.0000 mg | ORAL_TABLET | Freq: Three times a day (TID) | ORAL | 0 refills | Status: DC | PRN

## 2017-07-31 MED ORDER — LINACLOTIDE 145 MCG PO CAPS
145.0000 ug | ORAL_CAPSULE | Freq: Every day | ORAL | Status: DC
  Administered 2017-08-01 – 2017-08-02 (×2): 145 ug via ORAL
  Filled 2017-07-31 (×2): qty 1

## 2017-07-31 MED ORDER — ONDANSETRON HCL 4 MG/2ML IJ SOLN
4.0000 mg | Freq: Once | INTRAMUSCULAR | Status: AC
  Administered 2017-07-31: 4 mg via INTRAVENOUS
  Filled 2017-07-31: qty 2

## 2017-07-31 MED ORDER — STROKE: EARLY STAGES OF RECOVERY BOOK
Freq: Once | Status: DC
  Filled 2017-07-31: qty 1

## 2017-07-31 NOTE — Consult Note (Addendum)
Neurology Consultation  Reason for Consult: Dizziness vertigo double vision Referring Physician: Dr. Billy Fischer  CC: Severe dizziness  History is obtained from: Patient, chart  HPI: Deanna Thompson is a 62 y.o. 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  She reports that she was in her usual state of health when she started getting a sensation of vertigo which she describes as room spinning that started at 10:30 AM.  It has been a constant sensation with not much relief with medications that have been given-Valium and meclizine. She reports that she has had BPPV in the past where she gets a vertiginous sensation that only last a few seconds and never has had such persistent symptoms. Her symptoms do get worse when she tries to get up and walk and have been told by the ED provider that she is very ataxic while walking.  I do not walker for the encounter. She also reports double vision when she started to look at her phone. She is a known patient of Dr. Estanislado Pandy, who is also treated her aneurysms with coiling, and also known to Dr. Ellene Route. Denies any current headache but reported some mild headache in the morning. Reports persistent moderate nausea and vomiting. Vision changes as above. Denies chest pain or palpitations.  Denies cough or shortness of breath.  Denies fevers or chills.  Denies abdominal pain.   LKW: 10:30 AM on 07/31/2017 tpa given?: no, outside window Premorbid modified Rankin scale (mRS): 0 ROS:ROS was performed and is negative except as noted in the HPI.    Past Medical History:  Diagnosis Date  . Abdominal hernia   . Abscess of pulp of tooth 12/12/14   left side  . Anxiety   .  Arthritis    back  and down flank  right side more than left  . B12 deficiency 06/12/2015  . Brain aneurysm    METAL COIL PREVENTS MRI IMAGING and CLIP  . Cirrhosis ([place])    Presumably alcoholic cirrhosis  . Complication of anesthesia   . Depression   . ETOH abuse    Quit in 07/2011   . Fatigue    due to Sjogren's disease  . Folate deficiency 06/30/2016  . GERD (gastroesophageal reflux disease)   . HTN (hypertension)    no longer medicated, dated on 04/25/12  . IBS (irritable bowel syndrome)   . Iron deficiency anemia due to chronic blood loss   . Myofascial pain   . Panic attack   . PONV (postoperative nausea and vomiting)   . Psoriatic arthritis ([place])   . Right inguinal hernia 08/16/2012  . Seizures ([place]) 1997   1 seizure from coil insertion but no more seizures and no meds for seizures  . Sjogren's disease ([place])   . [place] HEAD 02/03/2007   Qualifier: Diagnosis of  By: Aline Brochure MD, Dorothyann Peng    . 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([place])    Family History  Problem Relation Age of Onset  . Cirrhosis Mother        nash  . COPD Father   . Heart disease Father   . Asthma Unknown   . Heart disease Unknown   . Colon cancer Neg Hx   . Inflammatory bowel disease Neg Hx  Social History:   reports that she quit smoking about 22 years ago. Her smoking use included cigarettes. She has a 24.00 pack-year smoking history. She has never used smokeless tobacco. She reports that she does not drink alcohol or use drugs.   Medications  Current Facility-Administered Medications:  .  iopamidol (ISOVUE-370) 76 % injection, , , ,   Current Outpatient Medications:  .  acetaminophen (TYLENOL) 325 MG tablet, Take 325 mg by mouth daily as needed for headache (pain)., Disp: , Rfl:  .  acetaminophen (TYLENOL) 500 MG tablet, Take 500 mg by mouth daily as needed (if no relief from 325 mg tylenol). , Disp: , Rfl:  .  bumetanide (BUMEX) 1 MG tablet, TAKE (1) TABLET BY MOUTH (2)  TIMES DAILY. (Patient taking differently: Take 1 mg by mouth 2 (two) times daily. ), Disp: 180 tablet, Rfl: 3 .  buPROPion (WELLBUTRIN SR) 100 MG 12 hr tablet, Take 1 tablet (100 mg total) by mouth 2 (two) times daily., Disp: 60 tablet, Rfl: 5 .  docusate sodium (COLACE) 100 MG capsule, Take 100 mg by mouth 2 (two) times daily. , Disp: , Rfl:  .  escitalopram (LEXAPRO) 20 MG tablet, TAKE (1) TABLET BY MOUTH AT BEDTIME. (Patient taking differently: Take 20 mg by mouth at bedtime. ), Disp: 30 tablet, Rfl: 5 .  fluticasone (FLONASE) 50 MCG/ACT nasal spray, Place 1 spray into both nostrils See admin instructions. Instill 1 spray into each nostril every morning, may also use later in the day as needed for sinus congestion, Disp: , Rfl:  .  hydrocortisone cream 1 %, Apply 1 application topically 2 (two) times daily as needed (eczema in ears)., Disp: , Rfl:  .  Lifitegrast (XIIDRA) 5 % SOLN, Place 1 drop into both eyes 2 (two) times daily., Disp: , Rfl:  .  linaclotide (LINZESS) 145 MCG CAPS capsule, Take 1 capsule (145 mcg total) by mouth daily before breakfast., Disp: 30 capsule, Rfl: 11 .  loratadine (CLARITIN) 10 MG tablet, Take 10 mg by mouth daily., Disp: , Rfl:  .  Multiple Vitamin (MULTIVITAMIN WITH MINERALS) TABS tablet, Take 1 tablet by mouth daily., Disp: , Rfl:  .  Polyvinyl Alcohol-Povidone (REFRESH OP), Place 1 drop into both eyes 3 (three) times daily as needed (dry eyes)., Disp: , Rfl:  .  ranitidine (ZANTAC) 150 MG tablet, TAKE (1) TABLET BY MOUTH TWICE DAILY., Disp: 180 tablet, Rfl: 5 .  Simethicone (GAS-X PO), Take 1 tablet by mouth 2 (two) times daily as needed (bloating/flatulence)., Disp: , Rfl:  .  sodium chloride (OCEAN) 0.65 % nasal spray, Place 1 spray into the nose 2 (two) times daily as needed for congestion. , Disp: , Rfl:  .  spironolactone (ALDACTONE) 100 MG tablet, TAKE (1) TABLET BY MOUTH TWICE DAILY., Disp: 180 tablet, Rfl: 5 .  meclizine (ANTIVERT) 25 MG tablet, Take 1  tablet (25 mg total) by mouth 3 (three) times daily as needed for dizziness., Disp: 30 tablet, Rfl: 0 .  ondansetron (ZOFRAN) 4 MG tablet, Take 1 tablet (4 mg total) by mouth every 8 (eight) hours as needed for nausea or vomiting., Disp: 12 tablet, Rfl: 0  Exam: Current vital signs: BP 139/89   Pulse 71   Temp (!) 97.4 F (36.3 C) (Oral)   Resp 17   Ht 5\' 8"  (1.727 m)   Wt 103 kg (227 lb)   SpO2 97%   BMI 34.52 kg/m  Vital signs in last 24 hours: Temp:  [97.4  F (36.3 C)] 97.4 F (36.3 C) (05/04 1320) Pulse Rate:  [59-71] 71 (05/04 2126) Resp:  [11-20] 17 (05/04 2126) BP: (122-139)/(63-115) 139/89 (05/04 2126) SpO2:  [88 %-100 %] 97 % (05/04 2126) Weight:  [103 kg (227 lb)] 103 kg (227 lb) (05/04 1308)  GENERAL: Awake, alert in NAD HEENT: - Normocephalic and atraumatic, dry mm, no LN++, no Thyromegally LUNGS - Clear to auscultation bilaterally with no wheezes CV - S1S2 RRR, no m/r/g, equal pulses bilaterally. ABDOMEN - Soft, nontender, nondistended with normoactive BS Ext: warm, well perfused, intact peripheral pulses, _no edema  NEURO:  Mental Status: AA&Ox3  Language: speech is non-dysarthric.  Naming, repetition, fluency, and comprehension intact. Cranial Nerves: PERL, extra ocular movement exam shows mildly disconjugate gaze, nystagmus at rest, no nystagmus while looking to the left, and right beating nystagmus while looking to the right along with rotatory nystagmus while looking up. visual fields full, no facial asymmetry, facial sensation intact, hearing intact, tongue/uvula/soft palate midline, normal sternocleidomastoid and trapezius muscle strength. No evidence of tongue atrophy or fibrillations Motor: 5/5 all over Tone: is normal and bulk is normal Sensation- Intact to light touch bilaterally, no extinction Coordination: FTN intact bilaterally, no ataxia in BLE. Gait- deferred but reportedly extremely ataxic on the examination by ED provider Hints exam-negative  head impulse, vertical rotatory and right beating nystagmus on right gaze and upward gaze, no vertical skew NIHSS-1 for gaze   Labs I have reviewed labs in epic and the results pertinent to this consultation are:  CBC    Component Value Date/Time   WBC 10.8 (H) 07/31/2017 1319   RBC 4.47 07/31/2017 1319   HGB 13.8 07/31/2017 1319   HCT 41.0 07/31/2017 1319   HCT 41 07/30/2011 0743   PLT 207 07/31/2017 1319   MCV 91.7 07/31/2017 1319   MCV 104.3 07/30/2011 0743   MCH 30.9 07/31/2017 1319   MCHC 33.7 07/31/2017 1319   RDW 13.4 07/31/2017 1319   LYMPHSABS 1.2 06/29/2017 1117   MONOABS 0.5 06/29/2017 1117   EOSABS 0.2 06/29/2017 1117   BASOSABS 0.0 06/29/2017 1117    CMP     Component Value Date/Time   NA 140 07/31/2017 1319   NA 138 07/30/2011 0741   K 3.6 07/31/2017 1319   K 3.8 07/30/2011 0741   CL 103 07/31/2017 1319   CO2 20 (L) 07/31/2017 1319   GLUCOSE 159 (H) 07/31/2017 1319   BUN 20 07/31/2017 1319   BUN 4 07/30/2011 0741   CREATININE 1.26 (H) 07/31/2017 1319   CREATININE 1.31 (H) 01/19/2017 1550   CALCIUM 9.5 07/31/2017 1319   PROT 7.7 07/31/2017 1319   ALBUMIN 4.2 07/31/2017 1319   ALBUMIN 3.0 07/30/2011 0741   AST 22 07/31/2017 1319   AST 171 07/30/2011 0741   ALT 17 07/31/2017 1319   ALKPHOS 65 07/31/2017 1319   ALKPHOS 111 07/30/2011 0741   BILITOT 1.1 07/31/2017 1319   BILITOT 3.6 07/30/2011 0741   GFRNONAA 45 (L) 07/31/2017 1319   GFRNONAA 44 (L) 01/19/2017 1550   GFRAA 53 (L) 07/31/2017 1319   GFRAA 52 (L) 01/19/2017 1550   Imaging I have reviewed the images obtained: CT-scan of the brain-no acute changes.  Clipped top of the basilar aneurysm.  3 mm left MCA aneurysm that is untreated.  No acute bleed.  No evolving infarct. MRI cannot be performed because the clip  Assessment:  62 year old woman with past medical history as above presenting for acute onset of severe vertigo along  with nausea and gait instability. She has rotatory nystagmus  on upward gaze as well as right beating nystagmus on rightward gaze with no skew deviation and negative head impulse, making me suspicious for a central etiology like a brainstem stroke. I would recommend admission for stroke work-up at this time.  Impression: -Vertigo- evaluate for central pathology like brainstem stroke -Less likely peripheral vertigo -History of subarachnoid hemorrhage due to ruptured top of the basilar aneurysm status post clipping and coiling. -Unsecured left MCA aneurysm 3 mm.  Followed by Dr. Estanislado Pandy  Recommendations: -Admit to hospitalist on telemetry -Frequent neurochecks -2D echocardiogram -Cannot get MRI- clipping incompatible -Hemoglobin A1c -Fasting lipid panel -Physical therapy, Occupational Therapy and speech therapy -Vestibular rehab might be needed -Antiplatelets-patient was taken off of all antiplatelets because of deranged hepatic function.  She is concerned about starting aspirin or Plavix.  We did talk about starting low-dose aspirin.  She would like this to be discussed with her GI doctor.  Further recommendations will be per primary team a stroke team after discussing appropriateness again with the patient. -Atorvastatin 80 mg p.o. Daily -Blood pressure management- usually in cases of acute stroke, permissive hypertension up to 220 is warranted but in her case because of history of subarachnoid's an aneurysm and an unsecured small aneurysm, I would recommend permissive hypertension only up to systolic blood pressure of 160.  Treat PRN any blood pressures more than 160.  Blood pressure should be normalized in 24 to 48 hours. Symptomatic treatment of vertigo with benzos, meclizine, antiemetics.  Please page stroke NP/PA/MD (listed on AMION)  from 8am-4 pm as this patient will be followed by the stroke team at this point.  -- Amie Portland, MD [facility] Pager: [phone] If 7pm to 7am, please call on call as listed on AMION.

## 2017-07-31 NOTE — ED Notes (Signed)
EDP at bedside  

## 2017-07-31 NOTE — ED Triage Notes (Signed)
Pt brought in by EMS due to having a sudden onset of nausea vomiting. Per EMS, pt was diaphoretic during episode and pt received 4mg  of zofran.

## 2017-07-31 NOTE — H&P (Signed)
History and Physical    L[place]ers RXV:[id number] DOB: 08-11-1955 DOA: 07/31/2017  Referring MD/NP/PA:   PCP: Mikey Kirschner, MD   Patient coming from:  The patient is coming from home.  At baseline, pt is independent for most of ADL.  Chief Complaint: Dizziness  HPI: [place] is a 62 y.o. female with medical history significant of hypertension, GERD, depression, brain aneurysm with clip and coil, liver cirrhosis, hepatic encephalopathy, alcohol abuse in remission, IBS, Sjogren's disease, CKD 3, who presents with dizziness.  Patient states that she has been having dizziness since this morning.  It is associated with nausea and vomiting.  She also has fuzzy vision.  She feels like room spinning around her.  Denies unilateral weakness, numbness or tingling in the extremities.  No ear ringing or hearing loss.  No facial droop or slurred speech.  Patient had diaphoresis.  Patient denies chest pain, shortness of breath, cough, fever or chills.  No nausea, vomiting, diarrhea or abdominal pain no symptoms of UTI.  Patient states that she had left ear fungal infection in the past, but no ear pain or ear drainage recently.  ED Course: pt was found to have WBC 10.8, negative troponin, lipase 77, negative pregnancy test, stable renal function, temperature 97.4, bradycardia, oxygen saturation 88-100% on room air. Pt is placed on telemetry bed for observation.  Dr. Rory Percy of neurology was consulted.  Dr. Doylene Canard of cardiology was consulted.  # CT-scan showed no acute issus, but showed clipped basilar aneurysm.  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  Review of Systems:   General: no fevers, chills, no body weight gain, no fatigue HEENT: no blurry vision, hearing changes or sore throat Respiratory: no dyspnea, coughing, wheezing CV: no chest pain, no palpitations GI: no nausea, vomiting, abdominal pain, diarrhea,  constipation GU: no dysuria, burning on urination, increased urinary frequency, hematuria  Ext: no leg edema Neuro: no unilateral weakness, numbness, or tingling, no hearing loss. Has fussy vision and dizziness. Skin: no rash, no skin tear. MSK: No muscle spasm, no deformity, no limitation of range of movement in spin Heme: No easy bruising.  Travel history: No recent long distant travel.  Allergy:  Allergies  Allergen Reactions  . Omeprazole Shortness Of Breath  . Other Shortness Of Breath and Rash    Red Meat Causes shortness of breath, rash and flu like symptoms.   . Phenytoin Rash  . Keflex [Cephalexin] Other (See Comments)    Severe yeast infection  . Phenergan [Promethazine Hcl] Other (See Comments)    Not in right state of mind, HALLUCINATION    Past Medical History:  Diagnosis Date  . Abdominal hernia   . Abscess of pulp of tooth 12/12/14   left side  . Anxiety   . Arthritis    back  and down flank  right side more than left  . B12 deficiency 06/12/2015  . Brain aneurysm    METAL COIL PREVENTS MRI IMAGING and CLIP  . Cirrhosis ([place])    Presumably alcoholic cirrhosis  . Complication of anesthesia   . Depression   . ETOH abuse    Quit in 07/2011   . Fatigue    due to Sjogren's disease  . Folate deficiency 06/30/2016  . GERD (gastroesophageal reflux disease)   . HTN (hypertension)    no longer medicated, dated on 04/25/12  . IBS (irritable bowel syndrome)   .  Iron deficiency anemia due to chronic blood loss   . Myofascial pain   . Panic attack   . PONV (postoperative nausea and vomiting)   . Psoriatic arthritis ([place])   . Right inguinal hernia 08/16/2012  . Seizures ([place]) 1997   1 seizure from coil insertion but no more seizures and no meds for seizures  . Sjogren's disease ([place])   . [place] HEAD 02/03/2007   Qualifier: Diagnosis of  By: Aline Brochure MD, Dorothyann Peng    . 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([place])     Past Surgical History:  Procedure  Laterality Date  . AGILE CAPSULE N/A 06/21/2014   Procedure: AGILE CAPSULE;  Surgeon: Danie Binder, MD;  [facility];  Service: Endoscopy;  Laterality: N/A;  0700  . BIOPSY  10/13/2011   SLF: mild gastritis/duodenal polypoid lesion   . CEREBRAL ANEURYSM REPAIR  1997  . COLONOSCOPY  10/13/2011   ZWC:HENIDPOE hemorrhoids/varices rectal/lesion in ascending colon(HYPERPLASTIC POLYP)  . COLONOSCOPY N/A 01/17/2013   SLF: 1. Internal hemorrhoids 2. Ascending colon polyps 3.  Rectal varices.   . COLONOSCOPY N/A 08/10/2016   Dr. Oneida Alar: redundant left colon. external and internal hemorhroids. 5-10 year surveillance  . ESOPHAGOGASTRODUODENOSCOPY N/A 06/29/2014   SLF: 1. anemia due to polypoid lesions in teh antrum, and duodenal polyps 2. single duodenum AVM  . FLEXIBLE SIGMOIDOSCOPY N/A 01/21/2016   Procedure: FLEXIBLE SIGMOIDOSCOPY;  Surgeon: Danie Binder, MD;  [facility];  Service: Endoscopy;  Laterality: N/A;  245  . GIVENS CAPSULE STUDY N/A 06/29/2014   Procedure: GIVENS CAPSULE STUDY;  Surgeon: Danie Binder, MD;  [facility];  Service: Endoscopy;  Laterality: N/A;  . GIVENS CAPSULE STUDY N/A 07/13/2014   Procedure: GIVENS CAPSULE STUDY;  Surgeon: Danie Binder, MD;  [facility];  Service: Endoscopy;  Laterality: N/A;  700  . HEMORRHOID BANDING N/A 01/21/2016   Procedure: HEMORRHOID BANDING;  Surgeon: Danie Binder, MD;  [facility];  Service: Endoscopy;  Laterality: N/A;  . INGUINAL HERNIA REPAIR Right 08/28/2013   Procedure: RIGHT NGUINAL HERNIORRHAPHY WITH MESH;  Surgeon: Jamesetta So, MD;  Location: [facility];  Service: General;  Laterality: Right;  . INSERTION OF MESH N/A 06/09/2013   Procedure: INSERTION OF MESH;  Surgeon: Jamesetta So, MD;  Location: [facility];  Service: General;  Laterality: N/A;  . INSERTION OF MESH Right 08/28/2013   Procedure: INSERTION OF MESH;  Surgeon: Jamesetta So, MD;  Location: [facility];  Service: General;   Laterality: Right;  . PARACENTESIS  Feb 2014   10.7 liters  . TOOTH EXTRACTION Left 12/12/14  . UMBILICAL HERNIA REPAIR N/A 06/09/2013   Procedure: UMBILICAL HERNIORRHAPHY WITH MESH;  Surgeon: Jamesetta So, MD;  Location: [facility];  Service: General;  Laterality: N/A;    Social History:  reports that she quit smoking about 22 years ago. Her smoking use included cigarettes. She has a 24.00 pack-year smoking history. She has never used smokeless tobacco. She reports that she does not drink alcohol or use drugs.  Family History:  Family History  Problem Relation Age of Onset  . Cirrhosis Mother        nash  . COPD Father   . Heart disease Father   . Asthma Unknown   . Heart disease Unknown   . Colon cancer Neg Hx   . Inflammatory bowel disease Neg Hx      Prior to Admission medications  Medication Sig Start Date End Date Taking? Authorizing Provider  acetaminophen (TYLENOL) 325 MG tablet Take 325 mg by mouth daily as needed for headache (pain).   Yes [provider]  acetaminophen (TYLENOL) 500 MG tablet Take 500 mg by mouth daily as needed (if no relief from 325 mg tylenol).    Yes [provider]  bumetanide (BUMEX) 1 MG tablet TAKE (1) TABLET BY MOUTH (2) TIMES DAILY. Patient taking differently: Take 1 mg by mouth 2 (two) times daily.  04/01/16  Yes Fields, Sandi L, MD  buPROPion (WELLBUTRIN SR) 100 MG 12 hr tablet Take 1 tablet (100 mg total) by mouth 2 (two) times daily. 04/13/17  Yes Mikey Kirschner, MD  docusate sodium (COLACE) 100 MG capsule Take 100 mg by mouth 2 (two) times daily.    Yes [provider]  escitalopram (LEXAPRO) 20 MG tablet TAKE (1) TABLET BY MOUTH AT BEDTIME. Patient taking differently: Take 20 mg by mouth at bedtime.  04/13/17  Yes Mikey Kirschner, MD  fluticasone (FLONASE) 50 MCG/ACT nasal spray Place 1 spray into both nostrils See admin instructions. Instill 1 spray into each nostril every morning, may also use later in the day as  needed for sinus congestion   Yes [provider]  hydrocortisone cream 1 % Apply 1 application topically 2 (two) times daily as needed (eczema in ears).   Yes [provider]  Lifitegrast Shirley Friar) 5 % SOLN Place 1 drop into both eyes 2 (two) times daily.   Yes [provider]  linaclotide Rolan Lipa) 145 MCG CAPS capsule Take 1 capsule (145 mcg total) by mouth daily before breakfast. 02/24/17  Yes Mahala Menghini, PA-C  loratadine (CLARITIN) 10 MG tablet Take 10 mg by mouth daily.   Yes [provider]  Multiple Vitamin (MULTIVITAMIN WITH MINERALS) TABS tablet Take 1 tablet by mouth daily.   Yes [provider]  Polyvinyl Alcohol-Povidone (REFRESH OP) Place 1 drop into both eyes 3 (three) times daily as needed (dry eyes).   Yes [provider]  ranitidine (ZANTAC) 150 MG tablet TAKE (1) TABLET BY MOUTH TWICE DAILY. 07/05/17  Yes Mahala Menghini, PA-C  Simethicone (GAS-X PO) Take 1 tablet by mouth 2 (two) times daily as needed (bloating/flatulence).   Yes [provider]  sodium chloride (OCEAN) 0.65 % nasal spray Place 1 spray into the nose 2 (two) times daily as needed for congestion.    Yes [provider]  spironolactone (ALDACTONE) 100 MG tablet TAKE (1) TABLET BY MOUTH TWICE DAILY. 07/05/17  Yes Mahala Menghini, PA-C  meclizine (ANTIVERT) 25 MG tablet Take 1 tablet (25 mg total) by mouth 3 (three) times daily as needed for dizziness. 07/31/17   Hayden Rasmussen, MD  ondansetron (ZOFRAN) 4 MG tablet Take 1 tablet (4 mg total) by mouth every 8 (eight) hours as needed for nausea or vomiting. 07/31/17   Hayden Rasmussen, MD    Physical Exam: Vitals:   07/31/17 2315 07/31/17 2345 08/01/17 0015 08/01/17 0030  BP: 139/68 129/75 117/72 (!) 169/91  Pulse: (!) 58 (!) 58 63 (!) 58  Resp: (!) 9 14 13 11   Temp:      TempSrc:      SpO2: 97% 98% 99% 99%  Weight:      Height:       General: Not in acute distress HEENT:       Eyes:  PERRL, EOMI, no scleral icterus.  ENT: No discharge from the ears and nose, no pharynx injection, no tonsillar enlargement.        Neck: No JVD, no bruit, no mass felt. Heme: No neck lymph node enlargement. Cardiac: S1/S2, RRR, No murmurs, No gallops or rubs. Respiratory: No rales, wheezing, rhonchi or rubs. GI: Soft, nondistended, nontender, no rebound pain, no organomegaly, BS present. GU: No hematuria Ext: No pitting leg edema bilaterally. 2+DP/PT pulse bilaterally. Musculoskeletal: No joint deformities, No joint redness or warmth, no limitation of ROM in spin. Skin: No rashes.  Neuro: Alert, oriented X3, cranial nerves II-XII grossly intact, moves all extremities normally. Muscle strength 5/5 in all extremities, sensation to light touch intact. Brachial reflex 2+ bilaterally. Negative Babinski's sign. Normal finger to nose test. Psych: Patient is not psychotic, no suicidal or hemocidal ideation.  Labs on Admission: I have personally reviewed following labs and imaging studies  CBC: Recent Labs  Lab 07/31/17 1319  WBC 10.8*  HGB 13.8  HCT 41.0  MCV 91.7  PLT 824   Basic Metabolic Panel: Recent Labs  Lab 07/31/17 1319  NA 140  K 3.6  CL 103  CO2 20*  GLUCOSE 159*  BUN 20  CREATININE 1.26*  CALCIUM 9.5   GFR: Estimated Creatinine Clearance: 59.6 mL/min (A) (by C-G formula based on SCr of 1.26 mg/dL (H)). Liver Function Tests: Recent Labs  Lab 07/31/17 1319  AST 22  ALT 17  ALKPHOS 65  BILITOT 1.1  PROT 7.7  ALBUMIN 4.2   Recent Labs  Lab 07/31/17 1319  LIPASE 37   No results for input(s): AMMONIA in the last 168 hours. Coagulation Profile: No results for input(s): INR, PROTIME in the last 168 hours. Cardiac Enzymes: No results for input(s): CKTOTAL, CKMB, CKMBINDEX, TROPONINI in the last 168 hours. BNP (last 3 results) No results for input(s): PROBNP in the last 8760 hours. HbA1C: No results for input(s): HGBA1C in the last 72 hours. CBG: No  results for input(s): GLUCAP in the last 168 hours. Lipid Profile: No results for input(s): CHOL, HDL, LDLCALC, TRIG, CHOLHDL, LDLDIRECT in the last 72 hours. Thyroid Function Tests: No results for input(s): TSH, T4TOTAL, FREET4, T3FREE, THYROIDAB in the last 72 hours. Anemia Panel: No results for input(s): VITAMINB12, FOLATE, FERRITIN, TIBC, IRON, RETICCTPCT in the last 72 hours. Urine analysis:    Component Value Date/Time   COLORURINE YELLOW 08/01/2017 0025   APPEARANCEUR CLEAR 08/01/2017 0025   LABSPEC 1.039 (H) 08/01/2017 0025   PHURINE 6.0 08/01/2017 0025   GLUCOSEU NEGATIVE 08/01/2017 0025   HGBUR NEGATIVE 08/01/2017 0025   BILIRUBINUR NEGATIVE 08/01/2017 0025   KETONESUR 5 (A) 08/01/2017 0025   PROTEINUR NEGATIVE 08/01/2017 0025   UROBILINOGEN 0.2 06/08/2013 1943   NITRITE NEGATIVE 08/01/2017 0025   LEUKOCYTESUR NEGATIVE 08/01/2017 0025   Sepsis Labs: @LABRCNTIP (procalcitonin:4,lacticidven:4) )No results found for this or any previous visit (from the past 240 hour(s)).   Radiological Exams on Admission: Ct Angio Head W Or Wo Contrast  Result Date: 07/31/2017 CLINICAL DATA:  Initial evaluation for acute dizziness. History of treated basilar artery aneurysm EXAM: CT ANGIOGRAPHY HEAD AND NECK TECHNIQUE: Multidetector CT imaging of the head and neck was performed using the standard protocol during bolus administration of intravenous contrast. Multiplanar CT image reconstructions and MIPs were obtained to evaluate the vascular anatomy. Carotid stenosis measurements (when applicable) are obtained utilizing NASCET criteria, using the distal internal carotid diameter as the denominator. CONTRAST:  4mL ISOVUE-370 IOPAMIDOL (ISOVUE-370) INJECTION 76% COMPARISON:  Prior CT from earlier  the same day. FINDINGS: CTA NECK FINDINGS Aortic arch: Visualized aortic arch of normal caliber with normal branch pattern. Atheromatous plaque within the visualize arch and about the origin of the great  vessels without hemodynamically significant stenosis. Visualized subclavian arteries widely patent. Right carotid system: Right common carotid artery patent from its origin to the bifurcation without stenosis. Mixed plaque about the right bifurcation/proximal right ICA without hemodynamically significant stenosis. Right ICA patent distally to the skull base without stenosis, dissection, or occlusion. Left carotid system: Left common carotid artery patent from its origin to the bifurcation without stenosis. Mild scattered plaque about the left bifurcation without hemodynamically significant stenosis. Left ICA patent from the bifurcation to the skull base without stenosis, dissection, or occlusion. Vertebral arteries: Both of the vertebral arteries arise from the subclavian arteries. Right vertebral artery dominant. Mild plaque at the origin of the vertebral arteries with no more than mild narrowing. There is focal tortuosity with a short-segment severe stenosis involving the left vertebral artery at the level of the C2 transverse foramen (series 8, image 115). Vertebral arteries otherwise patent without hemodynamically significant stenosis or occlusion. Skeleton: No acute osseous abnormality. No worrisome lytic or blastic osseous lesions. Mild-to-moderate degenerative spondylolysis present at C5-6 and C6-7. Other neck: No other acute soft tissue abnormality within the neck. No adenopathy. Salivary glands normal. Thyroid gland normal. Upper chest: Visualized upper chest within normal limits. Partially visualized lungs are clear. Mild centrilobular emphysema. Review of the MIP images confirms the above findings CTA HEAD FINDINGS Anterior circulation: Petrous segments widely patent bilaterally. Scattered atheromatous plaque within the cavernous/supraclinoid ICAs with mild moderate multifocal narrowing, worse on the right. ICA termini widely patent. Dominant right A1 segment, accounting for a slightly diminutive left ICA  is compared to the right. Normal anterior communicating artery. Anterior cerebral arteries widely patent to their distal aspects. Median artery of corpus callosum noted. Right M1 segment patent without stenosis or occlusion. Normal right MCA bifurcation. Distal right MCA branches well perfused. Left M1 segment patent without stenosis. Approximate 3 mm aneurysm seen arising from the left MCA bifurcation (series 8, image 84). This projects laterally and superiorly. Left MCA branches well perfused distally. Posterior circulation: Vertebral arteries patent to the vertebrobasilar junction without stenosis. Right vertebral artery dominant. Posterior inferior cerebral arteries patent bilaterally. Basilar artery patent to its distal aspect. Streak artifact from coil pack from prior treated basilar apex aneurysm. Adjacent surgical clips present. No appreciable remnant or evidence for recanalization identified, although evaluation limited by streak artifact. Origin of the superior cerebral arteries not visualized, although appear to be patent distally. Similarly, proximal PCAs not well visualize, although also appear to be widely patent distally and perfused to their distal aspects. Venous sinuses: Patent. Anatomic variants: None significant. Delayed phase: Probable 7 mm calcified meningioma at the anterior falx again noted. No other abnormal enhancement. Right frontotemporal encephalomalacia again noted. Small remote left cerebellar infarct. Review of the MIP images confirms the above findings IMPRESSION: 1. Negative CTA for large vessel occlusion. 2. Treated basilar apex aneurysm. No remnant or CTA evidence for recanalization identified. 3. 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 4. Single short-segment severe distal left V2 stenosis. Vertebral arteries otherwise widely patent. Right vertebral artery dominant. 5. Atherosclerotic change about the carotid bifurcations and within the carotid siphons without high-grade  stenosis. Electronically Signed   By: Jeannine Boga M.D.   On: 07/31/2017 21:03   Ct Head Wo Contrast  Result Date: 07/31/2017 CLINICAL DATA:  62 year old female with a  history treated basilar artery aneurysm EXAM: CT HEAD WITHOUT CONTRAST TECHNIQUE: Contiguous axial images were obtained from the base of the skull through the vertex without intravenous contrast. COMPARISON:  12/25/2004, 11/16/2005, prior angiogram FINDINGS: Brain: No acute intracranial hemorrhage. No midline shift or mass effect. Encephalomalacia of the right frontal region and right temporal lobe again demonstrated. Unremarkable configuration of the ventricles. Streak artifact present secondary to coil mass and surgical clips at the treated basilar aneurysm. Gray-white differentiation maintained. Calcified lesion along the anterior falx overlying the left frontal lobe measuring 7 mm Vascular: Surgical changes of right pterional approach craniotomy Skull: No acute fracture Sinuses/Orbits: No acute finding. Other: None IMPRESSION: Head CT negative for acute intracranial abnormality. Redemonstration of treated basilar artery aneurysm and right pterional approach craniotomy with associated underlying encephalomalacia. Electronically Signed   By: Corrie Mckusick D.O.   On: 07/31/2017 14:43   Ct Angio Neck W And/or Wo Contrast  Result Date: 07/31/2017 CLINICAL DATA:  Initial evaluation for acute dizziness. History of treated basilar artery aneurysm EXAM: CT ANGIOGRAPHY HEAD AND NECK TECHNIQUE: Multidetector CT imaging of the head and neck was performed using the standard protocol during bolus administration of intravenous contrast. Multiplanar CT image reconstructions and MIPs were obtained to evaluate the vascular anatomy. Carotid stenosis measurements (when applicable) are obtained utilizing NASCET criteria, using the distal internal carotid diameter as the denominator. CONTRAST:  79mL ISOVUE-370 IOPAMIDOL (ISOVUE-370) INJECTION 76%  COMPARISON:  Prior CT from earlier the same day. FINDINGS: CTA NECK FINDINGS Aortic arch: Visualized aortic arch of normal caliber with normal branch pattern. Atheromatous plaque within the visualize arch and about the origin of the great vessels without hemodynamically significant stenosis. Visualized subclavian arteries widely patent. Right carotid system: Right common carotid artery patent from its origin to the bifurcation without stenosis. Mixed plaque about the right bifurcation/proximal right ICA without hemodynamically significant stenosis. Right ICA patent distally to the skull base without stenosis, dissection, or occlusion. Left carotid system: Left common carotid artery patent from its origin to the bifurcation without stenosis. Mild scattered plaque about the left bifurcation without hemodynamically significant stenosis. Left ICA patent from the bifurcation to the skull base without stenosis, dissection, or occlusion. Vertebral arteries: Both of the vertebral arteries arise from the subclavian arteries. Right vertebral artery dominant. Mild plaque at the origin of the vertebral arteries with no more than mild narrowing. There is focal tortuosity with a short-segment severe stenosis involving the left vertebral artery at the level of the C2 transverse foramen (series 8, image 115). Vertebral arteries otherwise patent without hemodynamically significant stenosis or occlusion. Skeleton: No acute osseous abnormality. No worrisome lytic or blastic osseous lesions. Mild-to-moderate degenerative spondylolysis present at C5-6 and C6-7. Other neck: No other acute soft tissue abnormality within the neck. No adenopathy. Salivary glands normal. Thyroid gland normal. Upper chest: Visualized upper chest within normal limits. Partially visualized lungs are clear. Mild centrilobular emphysema. Review of the MIP images confirms the above findings CTA HEAD FINDINGS Anterior circulation: Petrous segments widely patent  bilaterally. Scattered atheromatous plaque within the cavernous/supraclinoid ICAs with mild moderate multifocal narrowing, worse on the right. ICA termini widely patent. Dominant right A1 segment, accounting for a slightly diminutive left ICA is compared to the right. Normal anterior communicating artery. Anterior cerebral arteries widely patent to their distal aspects. Median artery of corpus callosum noted. Right M1 segment patent without stenosis or occlusion. Normal right MCA bifurcation. Distal right MCA branches well perfused. Left M1 segment patent without stenosis. Approximate 3 mm  aneurysm seen arising from the left MCA bifurcation (series 8, image 84). This projects laterally and superiorly. Left MCA branches well perfused distally. Posterior circulation: Vertebral arteries patent to the vertebrobasilar junction without stenosis. Right vertebral artery dominant. Posterior inferior cerebral arteries patent bilaterally. Basilar artery patent to its distal aspect. Streak artifact from coil pack from prior treated basilar apex aneurysm. Adjacent surgical clips present. No appreciable remnant or evidence for recanalization identified, although evaluation limited by streak artifact. Origin of the superior cerebral arteries not visualized, although appear to be patent distally. Similarly, proximal PCAs not well visualize, although also appear to be widely patent distally and perfused to their distal aspects. Venous sinuses: Patent. Anatomic variants: None significant. Delayed phase: Probable 7 mm calcified meningioma at the anterior falx again noted. No other abnormal enhancement. Right frontotemporal encephalomalacia again noted. Small remote left cerebellar infarct. Review of the MIP images confirms the above findings IMPRESSION: 1. Negative CTA for large vessel occlusion. 2. Treated basilar apex aneurysm. No remnant or CTA evidence for recanalization identified. 3. 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  4. Single short-segment severe distal left V2 stenosis. Vertebral arteries otherwise widely patent. Right vertebral artery dominant. 5. Atherosclerotic change about the carotid bifurcations and within the carotid siphons without high-grade stenosis. Electronically Signed   By: Jeannine Boga M.D.   On: 07/31/2017 21:03     EKG: Independently reviewed.  Sinus rhythm, QTC 503, left bundle blockage which is new, anteroseptal infarction pattern.   Assessment/Plan Principal Problem:   Dizziness Active Problems:   Cirrhosis (HCC)   Depression   Essential hypertension   GERD (gastroesophageal reflux disease)   Alcohol abuse   Brain aneurysm   CKD (chronic kidney disease), stage III (HCC)   LBBB (left bundle branch block)   Nausea & vomiting   Dizziness: Neurology was consulted, Dr. Rory Percy think are likely due to central vertigo, possible brainstem stroke. He recommended stroke work-up.  Patient cannot do MRI due to brain aneurysm with clipping or coiling.  -Will place on tele bed for obs. -Highly appreciate Dr. Johny Chess consultation, will follow up recommendations as follows:  -Frequent neurochecks  -2D echocardiogram  -Cannot get MRI- clipping incompatible  -Hemolobin A1c  -Fasting lipid panel  -Physical therapy, Occupational Therapy and speech therapy  -Vestibular rehab might be needed  -Antiplatelets-patient was taken off of all antiplatelets because of deranged hepatic function.  She is concerned about starting aspirin or Plavix.  We did talk about starting low-dose aspirin.  She would like this to be discussed with her GI doctor.  Further recommendations will be per primary team a stroke team after discussing appropriateness again with the patient.  -Atorvastatin 80 mg p.o. Daily  -Blood pressure management- usually in cases of acute stroke, permissive hypertension up to 220 is warranted but in her case because of history of subarachnoid's an aneurysm and an unsecured  small aneurysm, I would recommend permissive hypertension only up to systolic blood pressure of 160.  Treat PRN any blood pressures more than 160.  Blood pressure should be normalized in 24 to 48 hours.  Symptomatic treatment of vertigo with benzos, meclizine, antiemetics.  Cirrhosis ([place]): Mental status normal.  No signs of hepatic encephalopathy. - hold Bumex and spironolactone tonight and resume tomorrow -check ammomia level  Depression: Stable, no suicidal or homicidal ideations. 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  Followed by Dr. Estanislado Pandy -f/u with Dr. Estanislado Pandy  CKD (chronic kidney disease), stage III [facility]): -f/u by CBC  Nausea & vomiting: seconday to possible brain stem stroke -zofran prn  new LBBB (left bundle branch block): No chest pain or SOB. Trop negative. Dr. Doylene Canard was consulted. -f/u card recommendations -trop x 3 -f/u A1c, FLP, 2d echo.   DVT ppx: SQ Lovenox Code Status: Full code Family Communication: None at bed side.     Disposition Plan:  Anticipate discharge back to previous home environment Consults called:  Dr. Rory Percy of neuro and Dr. Doylene Canard of Card Admission status: Obs / tele     Date of Service 08/01/2017    Ivor Costa [facility] Pager [phone]  If 7PM-7AM, please contact night-coverage [email or web address] Password [email or web address] 08/01/2017, 1:03 AM

## 2017-07-31 NOTE — Consult Note (Signed)
Referring Physician:  Tamee Battin is an 62 y.o. female.                       Chief Complaint: Dizziness  HPI: 62 year old female with PMH of Brain aneurysm, liver cirrhosis, Sjogren's disease, IBS, GERD has positional vertigo since 10:30 AM. She has double vision also. She also has headache and nausea and vomiting. No fever, cough or shortness of breath. Neurology consult suspects brainstem stroke. EKG shows SR with LBBB, new or less than 64 years old.   Past Medical History:  Diagnosis Date  . Abdominal hernia   . Abscess of pulp of tooth 12/12/14   left side  . Anxiety   . Arthritis    back  and down flank  right side more than left  . B12 deficiency 06/12/2015  . Brain aneurysm    METAL COIL PREVENTS MRI IMAGING and CLIP  . Cirrhosis ([place])    Presumably alcoholic cirrhosis  . Complication of anesthesia   . Depression   . ETOH abuse    Quit in 07/2011   . Fatigue    due to Sjogren's disease  . Folate deficiency 06/30/2016  . GERD (gastroesophageal reflux disease)   . HTN (hypertension)    no longer medicated, dated on 04/25/12  . IBS (irritable bowel syndrome)   . Iron deficiency anemia due to chronic blood loss   . Myofascial pain   . Panic attack   . PONV (postoperative nausea and vomiting)   . Psoriatic arthritis ([place])   . Right inguinal hernia 08/16/2012  . Seizures ([place]) 1997   1 seizure from coil insertion but no more seizures and no meds for seizures  . Sjogren's disease ([place])   . [place] HEAD 02/03/2007   Qualifier: Diagnosis of  By: Aline Brochure MD, Dorothyann Peng    . 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([place])       Past Surgical History:  Procedure Laterality Date  . AGILE CAPSULE N/A 06/21/2014   Procedure: AGILE CAPSULE;  Surgeon: Danie Binder, MD;  [facility];  Service: Endoscopy;  Laterality: N/A;  0700  . BIOPSY  10/13/2011   SLF: mild gastritis/duodenal polypoid lesion   . CEREBRAL ANEURYSM REPAIR  1997  . COLONOSCOPY  10/13/2011    PPI:RJJOACZY hemorrhoids/varices rectal/lesion in ascending colon(HYPERPLASTIC POLYP)  . COLONOSCOPY N/A 01/17/2013   SLF: 1. Internal hemorrhoids 2. Ascending colon polyps 3.  Rectal varices.   . COLONOSCOPY N/A 08/10/2016   Dr. Oneida Alar: redundant left colon. external and internal hemorhroids. 5-10 year surveillance  . ESOPHAGOGASTRODUODENOSCOPY N/A 06/29/2014   SLF: 1. anemia due to polypoid lesions in teh antrum, and duodenal polyps 2. single duodenum AVM  . FLEXIBLE SIGMOIDOSCOPY N/A 01/21/2016   Procedure: FLEXIBLE SIGMOIDOSCOPY;  Surgeon: Danie Binder, MD;  [facility];  Service: Endoscopy;  Laterality: N/A;  245  . GIVENS CAPSULE STUDY N/A 06/29/2014   Procedure: GIVENS CAPSULE STUDY;  Surgeon: Danie Binder, MD;  [facility];  Service: Endoscopy;  Laterality: N/A;  . GIVENS CAPSULE STUDY N/A 07/13/2014   Procedure: GIVENS CAPSULE STUDY;  Surgeon: Danie Binder, MD;  [facility];  Service: Endoscopy;  Laterality: N/A;  700  . HEMORRHOID BANDING N/A 01/21/2016   Procedure: HEMORRHOID BANDING;  Surgeon: Danie Binder, MD;  [facility];  Service: Endoscopy;  Laterality: N/A;  . INGUINAL HERNIA REPAIR Right 08/28/2013   Procedure: RIGHT NGUINAL HERNIORRHAPHY WITH  MESH;  Surgeon: Jamesetta So, MD;  Location: [facility];  Service: General;  Laterality: Right;  . INSERTION OF MESH N/A 06/09/2013   Procedure: INSERTION OF MESH;  Surgeon: Jamesetta So, MD;  Location: [facility];  Service: General;  Laterality: N/A;  . INSERTION OF MESH Right 08/28/2013   Procedure: INSERTION OF MESH;  Surgeon: Jamesetta So, MD;  Location: [facility];  Service: General;  Laterality: Right;  . PARACENTESIS  Feb 2014   10.7 liters  . TOOTH EXTRACTION Left 12/12/14  . UMBILICAL HERNIA REPAIR N/A 06/09/2013   Procedure: UMBILICAL HERNIORRHAPHY WITH MESH;  Surgeon: Jamesetta So, MD;  Location: [facility];  Service: General;  Laterality: N/A;    Family History  Problem Relation  Age of Onset  . Cirrhosis Mother        nash  . COPD Father   . Heart disease Father   . Asthma Unknown   . Heart disease Unknown   . Colon cancer Neg Hx   . Inflammatory bowel disease Neg Hx    Social History:  reports that she quit smoking about 22 years ago. Her smoking use included cigarettes. She has a 24.00 pack-year smoking history. She has never used smokeless tobacco. She reports that she does not drink alcohol or use drugs.  Allergies:  Allergies  Allergen Reactions  . Omeprazole Shortness Of Breath  . Other Shortness Of Breath and Rash    Red Meat Causes shortness of breath, rash and flu like symptoms.   . Phenytoin Rash  . Keflex [Cephalexin] Other (See Comments)    Severe yeast infection  . Phenergan [Promethazine Hcl] Other (See Comments)    Not in right state of mind, HALLUCINATION     (Not in a hospital admission)  Results for orders placed or performed during the hospital encounter of 07/31/17 (from the past 48 hour(s))  Lipase, blood     Status: None   Collection Time: 07/31/17  1:19 PM  Result Value Ref Range   Lipase 37 11 - 51 U/L    Comment: Performed at [name], [address], [place], [name] [address]  Comprehensive metabolic panel     Status: Abnormal   Collection Time: 07/31/17  1:19 PM  Result Value Ref Range   Sodium 140 135 - 145 mmol/L   Potassium 3.6 3.5 - 5.1 mmol/L   Chloride 103 101 - 111 mmol/L   CO2 20 (L) 22 - 32 mmol/L   Glucose, Bld 159 (H) 65 - 99 mg/dL   BUN 20 6 - 20 mg/dL   Creatinine, Ser 1.26 (H) 0.44 - 1.00 mg/dL   Calcium 9.5 8.9 - 10.3 mg/dL   Total Protein 7.7 6.5 - 8.1 g/dL   Albumin 4.2 3.5 - 5.0 g/dL   AST 22 15 - 41 U/L   ALT 17 14 - 54 U/L   Alkaline Phosphatase 65 38 - 126 U/L   Total Bilirubin 1.1 0.3 - 1.2 mg/dL   GFR calc non Af Amer 45 (L) >60 mL/min   GFR calc Af Amer 53 (L) >60 mL/min    Comment: (NOTE) The eGFR has been calculated using the CKD EPI equation. This calculation has not been  validated in all clinical situations. eGFR's persistently <60 mL/min signify possible Chronic Kidney Disease.    Anion gap 17 (H) 5 - 15    Comment: Performed at [facility], [place] [address], [name], [place] [address]  CBC  Status: Abnormal   Collection Time: 07/31/17  1:19 PM  Result Value Ref Range   WBC 10.8 (H) 4.0 - 10.5 K/uL   RBC 4.47 3.87 - 5.11 MIL/uL   Hemoglobin 13.8 12.0 - 15.0 g/dL   HCT 41.0 36.0 - 46.0 %   MCV 91.7 78.0 - 100.0 fL   MCH 30.9 26.0 - 34.0 pg   MCHC 33.7 30.0 - 36.0 g/dL   RDW 13.4 11.5 - 15.5 %   Platelets 207 150 - 400 K/uL    Comment: Performed at [facility] [address] [place], [place] [address]  I-Stat beta hCG blood, ED     Status: None   Collection Time: 07/31/17  1:26 PM  Result Value Ref Range   I-stat hCG, quantitative <5.0 <5 mIU/mL   Comment 3            Comment:   GEST. AGE      CONC.  (mIU/mL)   <=1 WEEK        5 - 50     2 WEEKS       50 - 500     3 WEEKS       100 - 10,000     4 WEEKS     1,000 - 30,000        FEMALE AND NON-PREGNANT FEMALE:     LESS THAN 5 mIU/mL   I-stat troponin, ED     Status: None   Collection Time: 07/31/17  2:14 PM  Result Value Ref Range   Troponin i, poc 0.01 0.00 - 0.08 ng/mL   Comment 3            Comment: Due to the release kinetics of cTnI, a negative result within the first hours of the onset of symptoms does not rule out myocardial infarction with certainty. If myocardial infarction is still suspected, repeat the test at appropriate intervals.   I-Stat Troponin, ED (not at [facility])     Status: None   Collection Time: 07/31/17  5:29 PM  Result Value Ref Range   Troponin i, poc 0.00 0.00 - 0.08 ng/mL   Comment 3            Comment: Due to the release kinetics of cTnI, a negative result within the first hours of the onset of symptoms does not rule out myocardial infarction with certainty. If myocardial infarction is still suspected, repeat the test at appropriate  intervals.    Ct Angio Head W Or Wo Contrast  Result Date: 07/31/2017 CLINICAL DATA:  Initial evaluation for acute dizziness. History of treated basilar artery aneurysm EXAM: CT ANGIOGRAPHY HEAD AND NECK TECHNIQUE: Multidetector CT imaging of the head and neck was performed using the standard protocol during bolus administration of intravenous contrast. Multiplanar CT image reconstructions and MIPs were obtained to evaluate the vascular anatomy. Carotid stenosis measurements (when applicable) are obtained utilizing NASCET criteria, using the distal internal carotid diameter as the denominator. CONTRAST:  88m ISOVUE-370 IOPAMIDOL (ISOVUE-370) INJECTION 76% COMPARISON:  Prior CT from earlier the same day. FINDINGS: CTA NECK FINDINGS Aortic arch: Visualized aortic arch of normal caliber with normal branch pattern. Atheromatous plaque within the visualize arch and about the origin of the great vessels without hemodynamically significant stenosis. Visualized subclavian arteries widely patent. Right carotid system: Right common carotid artery patent from its origin to the bifurcation without stenosis. Mixed plaque about the right bifurcation/proximal right ICA without hemodynamically significant stenosis. Right ICA patent distally to  the skull base without stenosis, dissection, or occlusion. Left carotid system: Left common carotid artery patent from its origin to the bifurcation without stenosis. Mild scattered plaque about the left bifurcation without hemodynamically significant stenosis. Left ICA patent from the bifurcation to the skull base without stenosis, dissection, or occlusion. Vertebral arteries: Both of the vertebral arteries arise from the subclavian arteries. Right vertebral artery dominant. Mild plaque at the origin of the vertebral arteries with no more than mild narrowing. There is focal tortuosity with a short-segment severe stenosis involving the left vertebral artery at the level of the C2  transverse foramen (series 8, image 115). Vertebral arteries otherwise patent without hemodynamically significant stenosis or occlusion. Skeleton: No acute osseous abnormality. No worrisome lytic or blastic osseous lesions. Mild-to-moderate degenerative spondylolysis present at C5-6 and C6-7. Other neck: No other acute soft tissue abnormality within the neck. No adenopathy. Salivary glands normal. Thyroid gland normal. Upper chest: Visualized upper chest within normal limits. Partially visualized lungs are clear. Mild centrilobular emphysema. Review of the MIP images confirms the above findings CTA HEAD FINDINGS Anterior circulation: Petrous segments widely patent bilaterally. Scattered atheromatous plaque within the cavernous/supraclinoid ICAs with mild moderate multifocal narrowing, worse on the right. ICA termini widely patent. Dominant right A1 segment, accounting for a slightly diminutive left ICA is compared to the right. Normal anterior communicating artery. Anterior cerebral arteries widely patent to their distal aspects. Median artery of corpus callosum noted. Right M1 segment patent without stenosis or occlusion. Normal right MCA bifurcation. Distal right MCA branches well perfused. Left M1 segment patent without stenosis. Approximate 3 mm aneurysm seen arising from the left MCA bifurcation (series 8, image 84). This projects laterally and superiorly. Left MCA branches well perfused distally. Posterior circulation: Vertebral arteries patent to the vertebrobasilar junction without stenosis. Right vertebral artery dominant. Posterior inferior cerebral arteries patent bilaterally. Basilar artery patent to its distal aspect. Streak artifact from coil pack from prior treated basilar apex aneurysm. Adjacent surgical clips present. No appreciable remnant or evidence for recanalization identified, although evaluation limited by streak artifact. Origin of the superior cerebral arteries not visualized, although  appear to be patent distally. Similarly, proximal PCAs not well visualize, although also appear to be widely patent distally and perfused to their distal aspects. Venous sinuses: Patent. Anatomic variants: None significant. Delayed phase: Probable 7 mm calcified meningioma at the anterior falx again noted. No other abnormal enhancement. Right frontotemporal encephalomalacia again noted. Small remote left cerebellar infarct. Review of the MIP images confirms the above findings IMPRESSION: 1. Negative CTA for large vessel occlusion. 2. Treated basilar apex aneurysm. No remnant or CTA evidence for recanalization identified. 3. 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 4. Single short-segment severe distal left V2 stenosis. Vertebral arteries otherwise widely patent. Right vertebral artery dominant. 5. Atherosclerotic change about the carotid bifurcations and within the carotid siphons without high-grade stenosis. Electronically Signed   By: Jeannine Boga M.D.   On: 07/31/2017 21:03   Ct Head Wo Contrast  Result Date: 07/31/2017 CLINICAL DATA:  62 year old female with a history treated basilar artery aneurysm EXAM: CT HEAD WITHOUT CONTRAST TECHNIQUE: Contiguous axial images were obtained from the base of the skull through the vertex without intravenous contrast. COMPARISON:  12/25/2004, 11/16/2005, prior angiogram FINDINGS: Brain: No acute intracranial hemorrhage. No midline shift or mass effect. Encephalomalacia of the right frontal region and right temporal lobe again demonstrated. Unremarkable configuration of the ventricles. Streak artifact present secondary to coil mass and surgical clips at the treated  basilar aneurysm. Gray-white differentiation maintained. Calcified lesion along the anterior falx overlying the left frontal lobe measuring 7 mm Vascular: Surgical changes of right pterional approach craniotomy Skull: No acute fracture Sinuses/Orbits: No acute finding. Other: None IMPRESSION: Head CT  negative for acute intracranial abnormality. Redemonstration of treated basilar artery aneurysm and right pterional approach craniotomy with associated underlying encephalomalacia. Electronically Signed   By: Corrie Mckusick D.O.   On: 07/31/2017 14:43   Ct Angio Neck W And/or Wo Contrast  Result Date: 07/31/2017 CLINICAL DATA:  Initial evaluation for acute dizziness. History of treated basilar artery aneurysm EXAM: CT ANGIOGRAPHY HEAD AND NECK TECHNIQUE: Multidetector CT imaging of the head and neck was performed using the standard protocol during bolus administration of intravenous contrast. Multiplanar CT image reconstructions and MIPs were obtained to evaluate the vascular anatomy. Carotid stenosis measurements (when applicable) are obtained utilizing NASCET criteria, using the distal internal carotid diameter as the denominator. CONTRAST:  100m ISOVUE-370 IOPAMIDOL (ISOVUE-370) INJECTION 76% COMPARISON:  Prior CT from earlier the same day. FINDINGS: CTA NECK FINDINGS Aortic arch: Visualized aortic arch of normal caliber with normal branch pattern. Atheromatous plaque within the visualize arch and about the origin of the great vessels without hemodynamically significant stenosis. Visualized subclavian arteries widely patent. Right carotid system: Right common carotid artery patent from its origin to the bifurcation without stenosis. Mixed plaque about the right bifurcation/proximal right ICA without hemodynamically significant stenosis. Right ICA patent distally to the skull base without stenosis, dissection, or occlusion. Left carotid system: Left common carotid artery patent from its origin to the bifurcation without stenosis. Mild scattered plaque about the left bifurcation without hemodynamically significant stenosis. Left ICA patent from the bifurcation to the skull base without stenosis, dissection, or occlusion. Vertebral arteries: Both of the vertebral arteries arise from the subclavian arteries. Right  vertebral artery dominant. Mild plaque at the origin of the vertebral arteries with no more than mild narrowing. There is focal tortuosity with a short-segment severe stenosis involving the left vertebral artery at the level of the C2 transverse foramen (series 8, image 115). Vertebral arteries otherwise patent without hemodynamically significant stenosis or occlusion. Skeleton: No acute osseous abnormality. No worrisome lytic or blastic osseous lesions. Mild-to-moderate degenerative spondylolysis present at C5-6 and C6-7. Other neck: No other acute soft tissue abnormality within the neck. No adenopathy. Salivary glands normal. Thyroid gland normal. Upper chest: Visualized upper chest within normal limits. Partially visualized lungs are clear. Mild centrilobular emphysema. Review of the MIP images confirms the above findings CTA HEAD FINDINGS Anterior circulation: Petrous segments widely patent bilaterally. Scattered atheromatous plaque within the cavernous/supraclinoid ICAs with mild moderate multifocal narrowing, worse on the right. ICA termini widely patent. Dominant right A1 segment, accounting for a slightly diminutive left ICA is compared to the right. Normal anterior communicating artery. Anterior cerebral arteries widely patent to their distal aspects. Median artery of corpus callosum noted. Right M1 segment patent without stenosis or occlusion. Normal right MCA bifurcation. Distal right MCA branches well perfused. Left M1 segment patent without stenosis. Approximate 3 mm aneurysm seen arising from the left MCA bifurcation (series 8, image 84). This projects laterally and superiorly. Left MCA branches well perfused distally. Posterior circulation: Vertebral arteries patent to the vertebrobasilar junction without stenosis. Right vertebral artery dominant. Posterior inferior cerebral arteries patent bilaterally. Basilar artery patent to its distal aspect. Streak artifact from coil pack from prior treated  basilar apex aneurysm. Adjacent surgical clips present. No appreciable remnant or evidence for recanalization identified, although  evaluation limited by streak artifact. Origin of the superior cerebral arteries not visualized, although appear to be patent distally. Similarly, proximal PCAs not well visualize, although also appear to be widely patent distally and perfused to their distal aspects. Venous sinuses: Patent. Anatomic variants: None significant. Delayed phase: Probable 7 mm calcified meningioma at the anterior falx again noted. No other abnormal enhancement. Right frontotemporal encephalomalacia again noted. Small remote left cerebellar infarct. Review of the MIP images confirms the above findings IMPRESSION: 1. Negative CTA for large vessel occlusion. 2. Treated basilar apex aneurysm. No remnant or CTA evidence for recanalization identified. 3. 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 4. Single short-segment severe distal left V2 stenosis. Vertebral arteries otherwise widely patent. Right vertebral artery dominant. 5. Atherosclerotic change about the carotid bifurcations and within the carotid siphons without high-grade stenosis. Electronically Signed   By: Jeannine Boga M.D.   On: 07/31/2017 21:03    Review Of Systems Constitutional: No fever, chills, weight loss or gain. Eyes: No vision change, wears glasses. No discharge or pain. Ears: No hearing loss, No tinnitus. Respiratory: No asthma, COPD, pneumonias. No shortness of breath. No hemoptysis. Cardiovascular: No chest pain, palpitation, leg edema. Gastrointestinal: No nausea, vomiting, diarrhea, constipation. No GI bleed. No hepatitis. Genitourinary: No dysuria, hematuria, kidney stone. No incontinance. Neurological: Positive headache, stroke, no seizures.  Psychiatry: No psych facility admission for anxiety, depression, suicide. No detox. Skin: No rash. Musculoskeletal: Positive joint pain, fibromyalgia, neck pain, back  pain. Lymphadenopathy: No lymphadenopathy. Hematology: No anemia or easy bruising.   Blood pressure 136/70, pulse 60, temperature (!) 97.4 F (36.3 C), temperature source Oral, resp. rate 15, height '5\' 8"'$  (1.727 m), weight 103 kg (227 lb), SpO2 99 %. Body mass index is 34.52 kg/m. General appearance: alert, cooperative, appears stated age and no distress Head: Normocephalic, atraumatic. Eyes: Hazel eyes, pink conjunctiva, corneas clear.  Neck: No adenopathy, no carotid bruit, no JVD, supple, symmetrical, trachea midline and thyroid not enlarged. Resp: Clear to auscultation bilaterally. Cardio: Regular rate and rhythm, S1, S2 normal, II/VI systolic murmur, no click, rub or gallop GI: Soft, non-tender; bowel sounds normal; no organomegaly. Extremities: No edema, cyanosis or clubbing. Skin: Warm and dry.  Neurologic: Alert and oriented X 3, normal strength. Normal coordination.  Assessment/Plan Dizziness Possible brainstem stroke Hypertension Brain aneurysms Obesity IBS Headache  Echocardiogram for LV function May need TEE for source of embolus  Deanna Riddle, MD  07/31/2017, 11:48 PM

## 2017-07-31 NOTE — ED Provider Notes (Signed)
[facility] EMERGENCY DEPARTMENT Provider Note   CSN: [id number] Arrival date & time: 07/31/17  1304     History   Chief Complaint Chief Complaint  Patient presents with  . Nausea  . Emesis    HPI [place] is a 62 y.o. female.  She has a complicated medical history of subarachnoid aneurysm status post clipping, hepatic encephalopathy, Sjogren's, and various other as listed below.  She is complaining of acute onset 1030 this morning of severe dizziness/room spinning with associated nausea vomiting and diaphoresis.  This continues until now.  She states she had a very minor history of some benign positional vertigo that would only be once been followed by some nausea.  There is been no trauma.  She did not have an initial headache but has a mild headache now.  She had Zofran with some relief.  She states she has had some diarrhea and some abdominal bloating over the last few days but this is not unusual for her.  There is been no recent change in her medications.  No trauma.  The history is provided by the patient.  Emesis   This is a new problem. The current episode started 3 to 5 hours ago. The problem occurs 5 to 10 times per day. The problem has not changed since onset.There has been no fever. Associated symptoms include diarrhea, headaches and sweats. Pertinent negatives include no abdominal pain, no arthralgias, no chills, no cough, no fever, no myalgias and no URI.  Dizziness  Quality:  Room spinning Severity:  Severe Onset quality:  Sudden Timing:  Constant Progression:  Unchanged Chronicity:  New Context: head movement   Context: not with loss of consciousness   Relieved by:  Closing eyes and being still Worsened by:  Turning head and sitting upright Ineffective treatments:  Medication Associated symptoms: diarrhea, headaches, nausea, vision changes and vomiting   Associated symptoms: no chest pain, no palpitations, no shortness of breath and no  syncope     Past Medical History:  Diagnosis Date  . Abdominal hernia   . Abscess of pulp of tooth 12/12/14   left side  . Anxiety   . Arthritis    back  and down flank  right side more than left  . B12 deficiency 06/12/2015  . Brain aneurysm    METAL COIL PREVENTS MRI IMAGING and CLIP  . Cirrhosis ([place])    Presumably alcoholic cirrhosis  . Complication of anesthesia   . Depression   . ETOH abuse    Quit in 07/2011   . Fatigue    due to Sjogren's disease  . Folate deficiency 06/30/2016  . GERD (gastroesophageal reflux disease)   . HTN (hypertension)    no longer medicated, dated on 04/25/12  . IBS (irritable bowel syndrome)   . Iron deficiency anemia due to chronic blood loss   . Myofascial pain   . Panic attack   . PONV (postoperative nausea and vomiting)   . Psoriatic arthritis ([place])   . Right inguinal hernia 08/16/2012  . Seizures ([place]) 1997   1 seizure from coil insertion but no more seizures and no meds for seizures  . Sjogren's disease ([place])   . [place] HEAD 02/03/2007   Qualifier: Diagnosis of  By: Aline Brochure MD, Dorothyann Peng    . 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 Folate deficiency 06/30/2016  . Constipation 04/01/2016  . 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  Hemorrhoids, internal, with bleeding 01/08/2016  . B12 deficiency 06/12/2015  . Psoriatic arthritis ([place]) 04/15/2015  . Nutritional anemia 09/19/2014  . Iron deficiency anemia due to chronic blood loss   . Insomnia 03/17/2014  . Depression 02/18/2014  . Labial swelling 08/23/2012  . Rash and nonspecific skin eruption 06/30/2012  . Colon polyps 11/18/2011  . Cirrhosis ([place]) 08/19/2011    Past Surgical History:  Procedure Laterality Date  . AGILE CAPSULE N/A 06/21/2014   Procedure: AGILE CAPSULE;  Surgeon: Danie Binder, MD;  Location[facility];  Service: Endoscopy;  Laterality: N/A;  0700  . BIOPSY  10/13/2011   SLF: mild  gastritis/duodenal polypoid lesion   . CEREBRAL ANEURYSM REPAIR  1997  . COLONOSCOPY  10/13/2011   EYC:XKGYJEHU hemorrhoids/varices rectal/lesion in ascending colon(HYPERPLASTIC POLYP)  . COLONOSCOPY N/A 01/17/2013   SLF: 1. Internal hemorrhoids 2. Ascending colon polyps 3.  Rectal varices.   . COLONOSCOPY N/A 08/10/2016   Dr. Oneida Alar: redundant left colon. external and internal hemorhroids. 5-10 year surveillance  . ESOPHAGOGASTRODUODENOSCOPY N/A 06/29/2014   SLF: 1. anemia due to polypoid lesions in teh antrum, and duodenal polyps 2. single duodenum AVM  . FLEXIBLE SIGMOIDOSCOPY N/A 01/21/2016   Procedure: FLEXIBLE SIGMOIDOSCOPY;  Surgeon: Danie Binder, MD;  [facility];  Service: Endoscopy;  Laterality: N/A;  245  . GIVENS CAPSULE STUDY N/A 06/29/2014   Procedure: GIVENS CAPSULE STUDY;  Surgeon: Danie Binder, MD;  Location: [facility];  Service: Endoscopy;  Laterality: N/A;  . GIVENS CAPSULE STUDY N/A 07/13/2014   Procedure: GIVENS CAPSULE STUDY;  Surgeon: Danie Binder, MD;  Location: [facility];  Service: Endoscopy;  Laterality: N/A;  700  . HEMORRHOID BANDING N/A 01/21/2016   Procedure: HEMORRHOID BANDING;  Surgeon: Danie Binder, MD;  Location[facility];  Service: Endoscopy;  Laterality: N/A;  . INGUINAL HERNIA REPAIR Right 08/28/2013   Procedure: RIGHT NGUINAL HERNIORRHAPHY WITH MESH;  Surgeon: Jamesetta So, MD;  Location: [facility] ORS;  Service: General;  Laterality: Right;  . INSERTION OF MESH N/A 06/09/2013   Procedure: INSERTION OF MESH;  Surgeon: Jamesetta So, MD;  Location: [facility] ORS;  Service: General;  Laterality: N/A;  . INSERTION OF MESH Right 08/28/2013   Procedure: INSERTION OF MESH;  Surgeon: Jamesetta So, MD;  Location: [facility] ORS;  Service: General;  Laterality: Right;  . PARACENTESIS  Feb 2014   10.7 liters  . TOOTH EXTRACTION Left 12/12/14  . UMBILICAL HERNIA REPAIR N/A 06/09/2013   Procedure: UMBILICAL HERNIORRHAPHY WITH MESH;  Surgeon: Jamesetta So, MD;   Location: [facility] ORS;  Service: General;  Laterality: N/A;     OB History    Gravida      Para      Term      Preterm      AB      Living  0     SAB      TAB      Ectopic      Multiple      Live Births               Home Medications    Prior to Admission medications   Medication Sig Start Date End Date Taking? Authorizing Provider  acetaminophen (TYLENOL) 500 MG tablet Take 500 mg by mouth daily as needed for moderate pain.     [provider]  bumetanide (BUMEX) 1 MG tablet TAKE (1) TABLET BY MOUTH (2) TIMES  DAILY. 04/01/16   Fields, Marga Melnick, MD  buPROPion (WELLBUTRIN SR) 100 MG 12 hr tablet Take 1 tablet (100 mg total) by mouth 2 (two) times daily. 04/13/17   Mikey Kirschner, MD  cetirizine (ZYRTEC) 10 MG tablet Take 10 mg by mouth as needed for allergies.    [provider]  cyanocobalamin 1000 MCG tablet Take 1,000 mcg by mouth daily.    [provider]  cycloSPORINE (RESTASIS) 0.05 % ophthalmic emulsion Place 1 drop into both eyes 2 (two) times daily.    [provider]  docusate sodium (COLACE) 100 MG capsule Take 100 mg by mouth. 1-2 times per day    [provider]  escitalopram (LEXAPRO) 20 MG tablet TAKE (1) TABLET BY MOUTH AT BEDTIME. 04/13/17   Mikey Kirschner, MD  linaclotide [facility]) 145 MCG CAPS capsule Take 1 capsule (145 mcg total) by mouth daily before breakfast. 02/24/17   Mahala Menghini, PA-C  Polyvinyl Alcohol-Povidone (REFRESH OP) Place 1 drop into both eyes 3 (three) times daily as needed (dry eyes).    [provider]  ranitidine (ZANTAC) 150 MG tablet TAKE (1) TABLET BY MOUTH TWICE DAILY. 07/05/17   Mahala Menghini, PA-C  sodium chloride (OCEAN) 0.65 % nasal spray Place 1 spray into the nose daily as needed for congestion.     [provider]  spironolactone (ALDACTONE) 100 MG tablet TAKE (1) TABLET BY MOUTH TWICE DAILY. 07/05/17   Mahala Menghini, PA-C    Family History Family  History  Problem Relation Age of Onset  . Cirrhosis Mother        nash  . COPD Father   . Heart disease Father   . Asthma Unknown   . Heart disease Unknown   . Colon cancer Neg Hx   . Inflammatory bowel disease Neg Hx     Social History Social History   Tobacco Use  . Smoking status: Former Smoker    Packs/day: 2.00    Years: 12.00    Pack years: 24.00    Types: Cigarettes    Last attempt to quit: 03/31/1995    Years since quitting: 22.3  . Smokeless tobacco: Never Used  . Tobacco comment: quit in 1997  Substance Use Topics  . Alcohol use: No    Comment: Bottle of wine a day for years, but quit in 07/2011  . Drug use: No     Allergies   Omeprazole; Other; Phenytoin; Keflex [cephalexin]; and Phenergan [promethazine hcl]   Review of Systems Review of Systems  Constitutional: Positive for diaphoresis. Negative for chills and fever.  HENT: Negative for ear pain and sore throat.   Eyes: Negative for pain and visual disturbance.  Respiratory: Negative for cough and shortness of breath.   Cardiovascular: Negative for chest pain, palpitations and syncope.  Gastrointestinal: Positive for diarrhea, nausea and vomiting. Negative for abdominal pain.  Genitourinary: Negative for dysuria and hematuria.  Musculoskeletal: Negative for arthralgias, back pain and myalgias.  Skin: Negative for color change and rash.  Neurological: Positive for dizziness and headaches. Negative for syncope and numbness.  All other systems reviewed and are negative.    Physical Exam Updated Vital Signs BP 122/71 (BP Location: Right Arm)   Pulse 60   Temp (!) 97.4 F (36.3 C) (Oral)   Resp 20   Ht 5\' 8"  (1.727 m)   Wt 103 kg (227 lb)   SpO2 100%   BMI 34.52 kg/m   Physical Exam  Constitutional:  She is oriented to person, place, and time. She appears well-developed and well-nourished. No distress.  HENT:  Head: Normocephalic and atraumatic.  Eyes: Pupils are equal, round, and reactive to  light. Conjunctivae are normal. Right eye exhibits nystagmus. Left eye exhibits nystagmus.  Neck: Neck supple.  Cardiovascular: Normal rate and regular rhythm.  No murmur heard. Pulmonary/Chest: Effort normal and breath sounds normal. No respiratory distress.  Abdominal: Soft. There is no tenderness.  Musculoskeletal: She exhibits no edema.  Neurological: She is alert and oriented to person, place, and time. She has normal strength. No cranial nerve deficit or sensory deficit. GCS eye subscore is 4. GCS verbal subscore is 5. GCS motor subscore is 6.  Skin: Skin is warm and dry.  Psychiatric: She has a normal mood and affect.  Nursing note and vitals reviewed.    ED Treatments / Results  Labs (all labs ordered are listed, but only abnormal results are displayed) Labs Reviewed  COMPREHENSIVE METABOLIC PANEL - Abnormal; Notable for the following components:      Result Value   CO2 20 (*)    Glucose, Bld 159 (*)    Creatinine, Ser 1.26 (*)    GFR calc non Af Amer 45 (*)    GFR calc Af Amer 53 (*)    Anion gap 17 (*)    All other components within normal limits  CBC - Abnormal; Notable for the following components:   WBC 10.8 (*)    All other components within normal limits  URINALYSIS, ROUTINE W REFLEX MICROSCOPIC - Abnormal; Notable for the following components:   Specific Gravity, Urine 1.039 (*)    Ketones, ur 5 (*)    All other components within normal limits  HEMOGLOBIN A1C - Abnormal; Notable for the following components:   Hgb A1c MFr Bld 5.8 (*)    All other components within normal limits  LIPID PANEL - Abnormal; Notable for the following components:   LDL Cholesterol 129 (*)    All other components within normal limits  LIPASE, BLOOD  TROPONIN I  TROPONIN I  TROPONIN I  AMMONIA  HIV ANTIBODY (ROUTINE TESTING)  RAPID URINE DRUG SCREEN, HOSP PERFORMED  I-STAT BETA HCG BLOOD, ED ([facility], [facility], [facility] ONLY)  I-STAT TROPONIN, ED  I-STAT TROPONIN, ED    EKG EKG  Interpretation  Date/Time:  Saturday Jul 31 2017 13:14:04 EDT Ventricular Rate:  59 PR Interval:    QRS Duration: 157 QT Interval:  507 QTC Calculation: 503 R Axis:   46 Text Interpretation:  Sinus rhythm Left bundle branch block new LBBB since prior tracing 3/15 Confirmed by Aletta Edouard [phone]) on 07/31/2017 1:54:48 PM   Radiology Ct Angio Head W Or Wo Contrast  Result Date: 07/31/2017 CLINICAL DATA:  Initial evaluation for acute dizziness. History of treated basilar artery aneurysm EXAM: CT ANGIOGRAPHY HEAD AND NECK TECHNIQUE: Multidetector CT imaging of the head and neck was performed using the standard protocol during bolus administration of intravenous contrast. Multiplanar CT image reconstructions and MIPs were obtained to evaluate the vascular anatomy. Carotid stenosis measurements (when applicable) are obtained utilizing NASCET criteria, using the distal internal carotid diameter as the denominator. CONTRAST:  30mL ISOVUE-370 IOPAMIDOL (ISOVUE-370) INJECTION 76% COMPARISON:  Prior CT from earlier the same day. FINDINGS: CTA NECK FINDINGS Aortic arch: Visualized aortic arch of normal caliber with normal branch pattern. Atheromatous plaque within the visualize arch and about the origin of the great vessels without hemodynamically significant stenosis. Visualized subclavian arteries widely patent. Right carotid  system: Right common carotid artery patent from its origin to the bifurcation without stenosis. Mixed plaque about the right bifurcation/proximal right ICA without hemodynamically significant stenosis. Right ICA patent distally to the skull base without stenosis, dissection, or occlusion. Left carotid system: Left common carotid artery patent from its origin to the bifurcation without stenosis. Mild scattered plaque about the left bifurcation without hemodynamically significant stenosis. Left ICA patent from the bifurcation to the skull base without stenosis, dissection, or occlusion.  Vertebral arteries: Both of the vertebral arteries arise from the subclavian arteries. Right vertebral artery dominant. Mild plaque at the origin of the vertebral arteries with no more than mild narrowing. There is focal tortuosity with a short-segment severe stenosis involving the left vertebral artery at the level of the C2 transverse foramen (series 8, image 115). Vertebral arteries otherwise patent without hemodynamically significant stenosis or occlusion. Skeleton: No acute osseous abnormality. No worrisome lytic or blastic osseous lesions. Mild-to-moderate degenerative spondylolysis present at C5-6 and C6-7. Other neck: No other acute soft tissue abnormality within the neck. No adenopathy. Salivary glands normal. Thyroid gland normal. Upper chest: Visualized upper chest within normal limits. Partially visualized lungs are clear. Mild centrilobular emphysema. Review of the MIP images confirms the above findings CTA HEAD FINDINGS Anterior circulation: Petrous segments widely patent bilaterally. Scattered atheromatous plaque within the cavernous/supraclinoid ICAs with mild moderate multifocal narrowing, worse on the right. ICA termini widely patent. Dominant right A1 segment, accounting for a slightly diminutive left ICA is compared to the right. Normal anterior communicating artery. Anterior cerebral arteries widely patent to their distal aspects. Median artery of corpus callosum noted. Right M1 segment patent without stenosis or occlusion. Normal right MCA bifurcation. Distal right MCA branches well perfused. Left M1 segment patent without stenosis. Approximate 3 mm aneurysm seen arising from the left MCA bifurcation (series 8, image 84). This projects laterally and superiorly. Left MCA branches well perfused distally. Posterior circulation: Vertebral arteries patent to the vertebrobasilar junction without stenosis. Right vertebral artery dominant. Posterior inferior cerebral arteries patent bilaterally.  Basilar artery patent to its distal aspect. Streak artifact from coil pack from prior treated basilar apex aneurysm. Adjacent surgical clips present. No appreciable remnant or evidence for recanalization identified, although evaluation limited by streak artifact. Origin of the superior cerebral arteries not visualized, although appear to be patent distally. Similarly, proximal PCAs not well visualize, although also appear to be widely patent distally and perfused to their distal aspects. Venous sinuses: Patent. Anatomic variants: None significant. Delayed phase: Probable 7 mm calcified meningioma at the anterior falx again noted. No other abnormal enhancement. Right frontotemporal encephalomalacia again noted. Small remote left cerebellar infarct. Review of the MIP images confirms the above findings IMPRESSION: 1. Negative CTA for large vessel occlusion. 2. Treated basilar apex aneurysm. No remnant or CTA evidence for recanalization identified. 3. 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 4. Single short-segment severe distal left V2 stenosis. Vertebral arteries otherwise widely patent. Right vertebral artery dominant. 5. Atherosclerotic change about the carotid bifurcations and within the carotid siphons without high-grade stenosis. Electronically Signed   By: Jeannine Boga M.D.   On: 07/31/2017 21:03   Ct Head Wo Contrast  Result Date: 08/02/2017 CLINICAL DATA:  62 y/o  F; stroke for follow-up. EXAM: CT HEAD WITHOUT CONTRAST TECHNIQUE: Contiguous axial images were obtained from the base of the skull through the vertex without intravenous contrast. COMPARISON:  07/31/2017 CT head and CTA head. FINDINGS: Brain: No evidence of acute infarction, hemorrhage, hydrocephalus, extra-axial collection  or mass lesion/mass effect. Stable right anterior temporal, right inferolateral frontal, and anterior insula encephalomalacia. Vascular: Basilar aneurysm clip again noted. Calcific atherosclerosis of vertebral  arteries and carotid siphons. Skull: Chronic postsurgical changes from right pterional craniotomy. Sinuses/Orbits: No acute finding. Other: None. IMPRESSION: 1. No acute intracranial abnormality identified. 2. Stable chronic postsurgical changes related to right pterional craniotomy and subjacent encephalomalacia. Basilar aneurysm clip noted. Electronically Signed   By: Kristine Garbe M.D.   On: 08/02/2017 04:03   Ct Head Wo Contrast  Result Date: 07/31/2017 CLINICAL DATA:  62 year old female with a history treated basilar artery aneurysm EXAM: CT HEAD WITHOUT CONTRAST TECHNIQUE: Contiguous axial images were obtained from the base of the skull through the vertex without intravenous contrast. COMPARISON:  12/25/2004, 11/16/2005, prior angiogram FINDINGS: Brain: No acute intracranial hemorrhage. No midline shift or mass effect. Encephalomalacia of the right frontal region and right temporal lobe again demonstrated. Unremarkable configuration of the ventricles. Streak artifact present secondary to coil mass and surgical clips at the treated basilar aneurysm. Gray-white differentiation maintained. Calcified lesion along the anterior falx overlying the left frontal lobe measuring 7 mm Vascular: Surgical changes of right pterional approach craniotomy Skull: No acute fracture Sinuses/Orbits: No acute finding. Other: None IMPRESSION: Head CT negative for acute intracranial abnormality. Redemonstration of treated basilar artery aneurysm and right pterional approach craniotomy with associated underlying encephalomalacia. Electronically Signed   By: Corrie Mckusick D.O.   On: 07/31/2017 14:43   Ct Angio Neck W And/or Wo Contrast  Result Date: 07/31/2017 CLINICAL DATA:  Initial evaluation for acute dizziness. History of treated basilar artery aneurysm EXAM: CT ANGIOGRAPHY HEAD AND NECK TECHNIQUE: Multidetector CT imaging of the head and neck was performed using the standard protocol during bolus administration of  intravenous contrast. Multiplanar CT image reconstructions and MIPs were obtained to evaluate the vascular anatomy. Carotid stenosis measurements (when applicable) are obtained utilizing NASCET criteria, using the distal internal carotid diameter as the denominator. CONTRAST:  45mL ISOVUE-370 IOPAMIDOL (ISOVUE-370) INJECTION 76% COMPARISON:  Prior CT from earlier the same day. FINDINGS: CTA NECK FINDINGS Aortic arch: Visualized aortic arch of normal caliber with normal branch pattern. Atheromatous plaque within the visualize arch and about the origin of the great vessels without hemodynamically significant stenosis. Visualized subclavian arteries widely patent. Right carotid system: Right common carotid artery patent from its origin to the bifurcation without stenosis. Mixed plaque about the right bifurcation/proximal right ICA without hemodynamically significant stenosis. Right ICA patent distally to the skull base without stenosis, dissection, or occlusion. Left carotid system: Left common carotid artery patent from its origin to the bifurcation without stenosis. Mild scattered plaque about the left bifurcation without hemodynamically significant stenosis. Left ICA patent from the bifurcation to the skull base without stenosis, dissection, or occlusion. Vertebral arteries: Both of the vertebral arteries arise from the subclavian arteries. Right vertebral artery dominant. Mild plaque at the origin of the vertebral arteries with no more than mild narrowing. There is focal tortuosity with a short-segment severe stenosis involving the left vertebral artery at the level of the C2 transverse foramen (series 8, image 115). Vertebral arteries otherwise patent without hemodynamically significant stenosis or occlusion. Skeleton: No acute osseous abnormality. No worrisome lytic or blastic osseous lesions. Mild-to-moderate degenerative spondylolysis present at C5-6 and C6-7. Other neck: No other acute soft tissue abnormality  within the neck. No adenopathy. Salivary glands normal. Thyroid gland normal. Upper chest: Visualized upper chest within normal limits. Partially visualized lungs are clear. Mild centrilobular emphysema. Review of  the MIP images confirms the above findings CTA HEAD FINDINGS Anterior circulation: Petrous segments widely patent bilaterally. Scattered atheromatous plaque within the cavernous/supraclinoid ICAs with mild moderate multifocal narrowing, worse on the right. ICA termini widely patent. Dominant right A1 segment, accounting for a slightly diminutive left ICA is compared to the right. Normal anterior communicating artery. Anterior cerebral arteries widely patent to their distal aspects. Median artery of corpus callosum noted. Right M1 segment patent without stenosis or occlusion. Normal right MCA bifurcation. Distal right MCA branches well perfused. Left M1 segment patent without stenosis. Approximate 3 mm aneurysm seen arising from the left MCA bifurcation (series 8, image 84). This projects laterally and superiorly. Left MCA branches well perfused distally. Posterior circulation: Vertebral arteries patent to the vertebrobasilar junction without stenosis. Right vertebral artery dominant. Posterior inferior cerebral arteries patent bilaterally. Basilar artery patent to its distal aspect. Streak artifact from coil pack from prior treated basilar apex aneurysm. Adjacent surgical clips present. No appreciable remnant or evidence for recanalization identified, although evaluation limited by streak artifact. Origin of the superior cerebral arteries not visualized, although appear to be patent distally. Similarly, proximal PCAs not well visualize, although also appear to be widely patent distally and perfused to their distal aspects. Venous sinuses: Patent. Anatomic variants: None significant. Delayed phase: Probable 7 mm calcified meningioma at the anterior falx again noted. No other abnormal enhancement. Right  frontotemporal encephalomalacia again noted. Small remote left cerebellar infarct. Review of the MIP images confirms the above findings IMPRESSION: 1. Negative CTA for large vessel occlusion. 2. Treated basilar apex aneurysm. No remnant or CTA evidence for recanalization identified. 3. 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 4. Single short-segment severe distal left V2 stenosis. Vertebral arteries otherwise widely patent. Right vertebral artery dominant. 5. Atherosclerotic change about the carotid bifurcations and within the carotid siphons without high-grade stenosis. Electronically Signed   By: Jeannine Boga M.D.   On: 07/31/2017 21:03    Procedures Procedures (including critical care time)  Medications Ordered in ED Medications  ondansetron (ZOFRAN) injection 4 mg (has no administration in time range)  LORazepam (ATIVAN) injection 0.5 mg (has no administration in time range)  sodium chloride 0.9 % bolus 1,000 mL (has no administration in time range)     Initial Impression / Assessment and Plan / ED Course  I have reviewed the triage vital signs and the nursing notes.  Pertinent labs & imaging results that were available during my care of the patient were reviewed by me and considered in my medical decision making (see chart for details).  Clinical Course as of Aug 03 750  Sat Jul 31, 2017  1532  Symptoms are improved after Lorazepam and IV fluids and Zofran.  She states she had a little bit of diplopia but that is fully resolved now.  She is able to move her head without difficult dizziness.  She has a new left bundle compared with her old EKG but her initial troponin is negative.  CNS imaging is also negative.  Will get a second troponin but if this is negative and she remains improved at the possible that she could be discharged.   [MB]    Clinical Course User Index [MB] Hayden Rasmussen, MD     Final Clinical Impressions(s) / ED Diagnoses   Final diagnoses:    Vestibular dizziness  Non-intractable vomiting with nausea, unspecified vomiting type  LBBB (left bundle branch block)    ED Discharge Orders    None  Hayden Rasmussen, MD 08/02/17 [phone]

## 2017-07-31 NOTE — ED Notes (Signed)
Patient transported to CT 

## 2017-07-31 NOTE — Discharge Instructions (Signed)
Your evaluated in the emergency department for acute onset of dizziness with associated nausea vomiting and sweats.  Your symptoms were improved in the emergency department with some medication.  Your CAT scan did not show any obvious signs of bleeding and your lab work did not show any obvious cardiac injury.  We are prescribing some nausea medicine and more meclizine and you should follow-up with your doctor.  Please return if any worsening symptoms.  You also had a new onset of left bundle branch block on your EKG and this will need to be followed by her doctor.

## 2017-08-01 ENCOUNTER — Observation Stay (HOSPITAL_COMMUNITY): Payer: Commercial Managed Care - PPO

## 2017-08-01 DIAGNOSIS — I639 Cerebral infarction, unspecified: Secondary | ICD-10-CM | POA: Diagnosis present

## 2017-08-01 DIAGNOSIS — N183 Chronic kidney disease, stage 3 (moderate): Secondary | ICD-10-CM | POA: Diagnosis not present

## 2017-08-01 DIAGNOSIS — I10 Essential (primary) hypertension: Secondary | ICD-10-CM | POA: Diagnosis not present

## 2017-08-01 DIAGNOSIS — Z8673 Personal history of transient ischemic attack (TIA), and cerebral infarction without residual deficits: Secondary | ICD-10-CM | POA: Diagnosis present

## 2017-08-01 DIAGNOSIS — I671 Cerebral aneurysm, nonruptured: Secondary | ICD-10-CM | POA: Diagnosis not present

## 2017-08-01 DIAGNOSIS — R42 Dizziness and giddiness: Secondary | ICD-10-CM | POA: Diagnosis not present

## 2017-08-01 DIAGNOSIS — K219 Gastro-esophageal reflux disease without esophagitis: Secondary | ICD-10-CM | POA: Diagnosis not present

## 2017-08-01 DIAGNOSIS — F1010 Alcohol abuse, uncomplicated: Secondary | ICD-10-CM | POA: Diagnosis not present

## 2017-08-01 DIAGNOSIS — K7030 Alcoholic cirrhosis of liver without ascites: Secondary | ICD-10-CM | POA: Diagnosis not present

## 2017-08-01 LAB — URINALYSIS, ROUTINE W REFLEX MICROSCOPIC
Bilirubin Urine: NEGATIVE
Glucose, UA: NEGATIVE mg/dL
Hgb urine dipstick: NEGATIVE
KETONES UR: 5 mg/dL — AB
LEUKOCYTES UA: NEGATIVE
NITRITE: NEGATIVE
PROTEIN: NEGATIVE mg/dL
Specific Gravity, Urine: 1.039 — ABNORMAL HIGH (ref 1.005–1.030)
pH: 6 (ref 5.0–8.0)

## 2017-08-01 LAB — RAPID URINE DRUG SCREEN, HOSP PERFORMED
AMPHETAMINES: NOT DETECTED
BENZODIAZEPINES: NOT DETECTED
Barbiturates: NOT DETECTED
Cocaine: NOT DETECTED
Opiates: NOT DETECTED
TETRAHYDROCANNABINOL: NOT DETECTED

## 2017-08-01 LAB — ECHOCARDIOGRAM COMPLETE
Height: 68 in
Weight: 3647.29 oz

## 2017-08-01 LAB — TROPONIN I: Troponin I: <0.03 ng/mL (ref <0.03)

## 2017-08-01 LAB — LIPID PANEL
CHOL/HDL RATIO: 4 ratio
CHOLESTEROL: 199 mg/dL (ref 0–200)
HDL: 50 mg/dL (ref >40)
LDL CALC: 129 mg/dL — AB (ref 0–99)
Triglycerides: 102 mg/dL (ref <150)
VLDL: 20 mg/dL (ref 0–40)

## 2017-08-01 LAB — HIV ANTIBODY (ROUTINE TESTING W REFLEX): HIV Screen 4th Generation wRfx: NONREACTIVE

## 2017-08-01 LAB — HEMOGLOBIN A1C
Hgb A1c MFr Bld: 5.8 % — ABNORMAL HIGH (ref 4.8–5.6)
Mean Plasma Glucose: 119.76 mg/dL

## 2017-08-01 LAB — AMMONIA: Ammonia: 20 umol/L (ref 9–35)

## 2017-08-01 MED ORDER — FLUTICASONE PROPIONATE 50 MCG/ACT NA SUSP
1.0000 | Freq: Every day | NASAL | Status: DC | PRN
Start: 2017-08-01 — End: 2017-08-02

## 2017-08-01 MED ORDER — ASPIRIN EC 81 MG PO TBEC
81.0000 mg | DELAYED_RELEASE_TABLET | Freq: Every day | ORAL | Status: DC
  Administered 2017-08-01 – 2017-08-02 (×2): 81 mg via ORAL
  Filled 2017-08-01 (×2): qty 1

## 2017-08-01 MED ORDER — LOSARTAN POTASSIUM 50 MG PO TABS
25.0000 mg | ORAL_TABLET | Freq: Every day | ORAL | Status: DC
  Administered 2017-08-01 – 2017-08-02 (×2): 25 mg via ORAL
  Filled 2017-08-01 (×2): qty 1

## 2017-08-01 NOTE — Progress Notes (Signed)
Patient just came to unit via [facility] ED ,was from home and brought to ED by EMS for c/o N/V .dizziness and fuzzy vision, with some improvement  Pt. Alert and oriented x 4  Pupils equal brisk  Reactive. MAE no c/o at present  Oriented to room  Staff and the use of call bell to call for assist as needed , vs cardiac monitor in use verification completed  Iv infusing  NS at 75 ml to  Rt  FA. Site. Clean, dry and intact. . Will endorsed pt care to oncoming RN.

## 2017-08-01 NOTE — Progress Notes (Signed)
Ref: Mikey Kirschner, MD   Subjective:  Decreased dizziness. No chest pain. Afebrile. Echocardiogram shows mild diffuse hypokinesia with EF 45 to 50 %, mild aortic valve stenosis and mild MR.  Objective:  Vital Signs in the last 24 hours: Temp:  [98.4 F (36.9 C)] 98.4 F (36.9 C) (05/05 0640) Pulse Rate:  [57-71] 61 (05/05 1544) Cardiac Rhythm: Sinus bradycardia;Normal sinus rhythm;Heart block;Bundle branch block (05/05 0800) Resp:  [9-20] 18 (05/05 1544) BP: (88-169)/(57-115) 108/63 (05/05 1544) SpO2:  [92 %-100 %] 100 % (05/05 1544) Weight:  [103.4 kg (227 lb 15.3 oz)] 103.4 kg (227 lb 15.3 oz) (05/05 0640)  Physical Exam: BP Readings from Last 1 Encounters:  08/01/17 108/63     Wt Readings from Last 1 Encounters:  08/01/17 103.4 kg (227 lb 15.3 oz)    Weight change:  Body mass index is 34.66 kg/m. HEENT: [place]/AT, Eyes-Hazel, Conjunctiva-Pink, Sclera-Non-icteric Neck: No JVD, No bruit, Trachea midline. Lungs:  Clear, Bilateral. Cardiac:  Regular rhythm, normal S1 and S2, no S3. II/VI systolic murmur. Abdomen:  Soft, non-tender. BS present. Extremities:  No edema present. No cyanosis. No clubbing. CNS: AxOx3, Cranial nerves grossly intact, moves all 4 extremities.  Skin: Warm and dry.   Intake/Output from previous day: No intake/output data recorded.    Lab Results: BMET    Component Value Date/Time   NA 140 07/31/2017 1319   NA 137 06/29/2017 1117   NA 135 04/02/2017 1222   NA 138 07/30/2011 0741   K 3.6 07/31/2017 1319   K 4.1 06/29/2017 1117   K 4.2 04/02/2017 1222   K 3.8 07/30/2011 0741   CL 103 07/31/2017 1319   CL 101 06/29/2017 1117   CL 99 (L) 04/02/2017 1222   CO2 20 (L) 07/31/2017 1319   CO2 22 06/29/2017 1117   CO2 22 04/02/2017 1222   GLUCOSE 159 (H) 07/31/2017 1319   GLUCOSE 114 (H) 06/29/2017 1117   GLUCOSE 106 (H) 04/02/2017 1222   BUN 20 07/31/2017 1319   BUN 18 06/29/2017 1117   BUN 21 (H) 04/02/2017 1222   BUN 4 07/30/2011 0741   CREATININE 1.26 (H) 07/31/2017 1319   CREATININE 1.19 (H) 06/29/2017 1117   CREATININE 1.22 (H) 04/02/2017 1222   CREATININE 1.31 (H) 01/19/2017 1550   CREATININE 1.33 (H) 08/05/2015 1310   CREATININE 1.05 02/15/2015 1228   CALCIUM 9.5 07/31/2017 1319   CALCIUM 9.9 06/29/2017 1117   CALCIUM 9.7 04/02/2017 1222   GFRNONAA 45 (L) 07/31/2017 1319   GFRNONAA 49 (L) 06/29/2017 1117   GFRNONAA 47 (L) 04/02/2017 1222   GFRNONAA 44 (L) 01/19/2017 1550   GFRNONAA 59 (L) 02/15/2015 1228   GFRNONAA 55 (L) 12/16/2012 1116   GFRAA 53 (L) 07/31/2017 1319   GFRAA 56 (L) 06/29/2017 1117   GFRAA 55 (L) 04/02/2017 1222   GFRAA 52 (L) 01/19/2017 1550   GFRAA 68 02/15/2015 1228   GFRAA 64 12/16/2012 1116   CBC    Component Value Date/Time   WBC 10.8 (H) 07/31/2017 1319   RBC 4.47 07/31/2017 1319   HGB 13.8 07/31/2017 1319   HCT 41.0 07/31/2017 1319   HCT 41 07/30/2011 0743   PLT 207 07/31/2017 1319   MCV 91.7 07/31/2017 1319   MCV 104.3 07/30/2011 0743   MCH 30.9 07/31/2017 1319   MCHC 33.7 07/31/2017 1319   RDW 13.4 07/31/2017 1319   LYMPHSABS 1.2 06/29/2017 1117   MONOABS 0.5 06/29/2017 1117   EOSABS 0.2 06/29/2017 1117  BASOSABS 0.0 06/29/2017 1117   HEPATIC Function Panel Recent Labs    01/19/17 1550 06/29/17 1117 07/31/17 1319  PROT 7.3 8.2* 7.7   HEMOGLOBIN A1C No components found for: HGA1C,  MPG CARDIAC ENZYMES Lab Results  Component Value Date   CKTOTAL 61 08/26/2011   CKMB 1.6 08/26/2011   TROPONINI <0.03 08/01/2017   TROPONINI <0.03 08/01/2017   TROPONINI <0.03 08/01/2017   BNP No results for input(s): PROBNP in the last 8760 hours. TSH No results for input(s): TSH in the last 8760 hours. CHOLESTEROL Recent Labs    08/01/17 0500  CHOL 199    Scheduled Meds: .  stroke: mapping our early stages of recovery book   Does not apply Once  . aspirin EC  81 mg Oral Daily  . atorvastatin  80 mg Oral q1800  . bumetanide  1 mg Oral BID  . buPROPion  100 mg Oral  BID  . docusate sodium  100 mg Oral BID  . enoxaparin (LOVENOX) injection  40 mg Subcutaneous Q24H  . escitalopram  20 mg Oral QHS  . famotidine  20 mg Oral BID  . fluticasone  1 spray Each Nare Q2200  . Lifitegrast  1 drop Both Eyes BID  . linaclotide  145 mcg Oral QAC breakfast  . loratadine  10 mg Oral Daily  . multivitamin with minerals  1 tablet Oral Daily  . spironolactone  100 mg Oral BID   Continuous Infusions: . sodium chloride 75 mL/hr at 08/01/17 0012   PRN Meds:.acetaminophen, fluticasone, hydrALAZINE, hydrocortisone cream, hydrOXYzine, meclizine, polyvinyl alcohol, senna-docusate, simethicone, sodium chloride, zolpidem  Assessment/Plan: Benign positional vertigo, improving Hypertension Brain aneurysms Obesity IBS Headache Mild AS and MR.  Continue medical treatment. Add small dose losartan as tolerated.   LOS: 0 days    Dixie Dials  MD  08/01/2017, 5:21 PM

## 2017-08-01 NOTE — ED Provider Notes (Signed)
Received care of patient at 4 PM from Dr. Melina Copa.  Please see his note for prior history, physical and care.  Briefly this is a 62 year old female who presented with concerns for vertigo.  Plan is to obtain delta troponin.   On my reevaluation of patient, she is continuing vertigo, and is unable to ambulate.  On my exam she has nystagmus which is present on direct gaze, nystagmus noted when looking to the right, however question of rotational nystagmus when looking towards nose  Patient also reported vague visual changes by history.  She has normal visual fields, but reported some blurred vision towards the left side.  While patient does have hx of peripheral vertigo, her symptoms are different and more persistent today.  She cannot have MRI given history of aneurysm repair clips.  Discussed with Dr. Rory Percy who recommends CT angio.  CT angio without acute abnormalities.  Patient was given valilum, meclizine but is still ataxic on exam.  Dr. Rory Percy came to evaluate the patient, recommends stroke work up.  Dr. Blaine Hamper to admit for continued care.    Gareth Morgan, MD 08/01/17 [phone]

## 2017-08-01 NOTE — Evaluation (Signed)
Speech Language Pathology Evaluation Patient Details Name: Peggyann Zwiefelhofer MRN: [id number] DOB: 11/30/1955 Today's Date: 08/01/2017 Time: 1220-1230 SLP Time Calculation (min) (ACUTE ONLY): 10 min  Problem List:  Patient Active Problem List   Diagnosis Date Noted  . 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 Essential hypertension 07/31/2017  . GERD (gastroesophageal reflux disease) 07/31/2017  . Alcohol abuse 07/31/2017  . Brain aneurysm 07/31/2017  . CKD (chronic kidney disease), stage III ([place]) 07/31/2017  . LBBB (left bundle branch block) 07/31/2017  . Dizziness 07/31/2017  . Nausea & vomiting 07/31/2017  . Folate deficiency 06/30/2016  . Constipation 04/01/2016  . 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 Hemorrhoids, internal, with bleeding 01/08/2016  . B12 deficiency 06/12/2015  . Psoriatic arthritis ([place]) 04/15/2015  . Nutritional anemia 09/19/2014  . Iron deficiency anemia due to chronic blood loss   . Insomnia 03/17/2014  . Depression 02/18/2014  . Labial swelling 08/23/2012  . Rash and nonspecific skin eruption 06/30/2012  . Colon polyps 11/18/2011  . Cirrhosis ([place]) 08/19/2011   Past Medical History:  Past Medical History:  Diagnosis Date  . Abdominal hernia   . Abscess of pulp of tooth 12/12/14   left side  . Anxiety   . Arthritis    back  and down flank  right side more than left  . B12 deficiency 06/12/2015  . Brain aneurysm    METAL COIL PREVENTS MRI IMAGING and CLIP  . Cirrhosis ([place])    Presumably alcoholic cirrhosis  . Complication of anesthesia   . Depression   . ETOH abuse    Quit in 07/2011   . Fatigue    due to Sjogren's disease  . Folate deficiency 06/30/2016  . GERD (gastroesophageal reflux disease)   . HTN (hypertension)    no longer medicated, dated on 04/25/12  . IBS (irritable bowel syndrome)   . Iron deficiency anemia due to chronic blood loss   . Myofascial pain   . Panic attack   . PONV (postoperative nausea and vomiting)   . Psoriatic arthritis  ([place])   . Right inguinal hernia 08/16/2012  . Seizures ([place]) 1997   1 seizure from coil insertion but no more seizures and no meds for seizures  . Sjogren's disease ([place])   . [place] HEAD 02/03/2007   Qualifier: Diagnosis of  By: Aline Brochure MD, Dorothyann Peng    . 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([place])    Past Surgical History:  Past Surgical History:  Procedure Laterality Date  . AGILE CAPSULE N/A 06/21/2014   Procedure: AGILE CAPSULE;  Surgeon: Danie Binder, MD;  Location: [facility];  Service: Endoscopy;  Laterality: N/A;  0700  . BIOPSY  10/13/2011   SLF: mild gastritis/duodenal polypoid lesion   . CEREBRAL ANEURYSM REPAIR  1997  . COLONOSCOPY  10/13/2011   DDU:KGURKYHC hemorrhoids/varices rectal/lesion in ascending colon(HYPERPLASTIC POLYP)  . COLONOSCOPY N/A 01/17/2013   SLF: 1. Internal hemorrhoids 2. Ascending colon polyps 3.  Rectal varices.   . COLONOSCOPY N/A 08/10/2016   Dr. Oneida Alar: redundant left colon. external and internal hemorhroids. 5-10 year surveillance  . ESOPHAGOGASTRODUODENOSCOPY N/A 06/29/2014   SLF: 1. anemia due to polypoid lesions in teh antrum, and duodenal polyps 2. single duodenum AVM  . FLEXIBLE SIGMOIDOSCOPY N/A 01/21/2016   Procedure: FLEXIBLE SIGMOIDOSCOPY;  Surgeon: Danie Binder, MD;  [facility]: [facility];  Service: Endoscopy;  Laterality: N/A;  245  . GIVENS CAPSULE STUDY N/A 06/29/2014   Procedure: GIVENS CAPSULE STUDY;  Surgeon: Danie Binder, MD;  [facility]:  [facility];  Service: Endoscopy;  Laterality: N/A;  . GIVENS CAPSULE STUDY N/A 07/13/2014   Procedure: GIVENS CAPSULE STUDY;  Surgeon: Danie Binder, MD;  [facility];  Service: Endoscopy;  Laterality: N/A;  700  . HEMORRHOID BANDING N/A 01/21/2016   Procedure: HEMORRHOID BANDING;  Surgeon: Danie Binder, MD;  [facility]: [facility];  Service: Endoscopy;  Laterality: N/A;  . INGUINAL HERNIA REPAIR Right 08/28/2013   Procedure: RIGHT NGUINAL HERNIORRHAPHY WITH  MESH;  Surgeon: Jamesetta So, MD;  Location: [facility];  Service: General;  Laterality: Right;  . INSERTION OF MESH N/A 06/09/2013   Procedure: INSERTION OF MESH;  Surgeon: Jamesetta So, MD;  Location: [facility];  Service: General;  Laterality: N/A;  . INSERTION OF MESH Right 08/28/2013   Procedure: INSERTION OF MESH;  Surgeon: Jamesetta So, MD;  Location: [facility];  Service: General;  Laterality: Right;  . PARACENTESIS  Feb 2014   10.7 liters  . TOOTH EXTRACTION Left 12/12/14  . UMBILICAL HERNIA REPAIR N/A 06/09/2013   Procedure: UMBILICAL HERNIORRHAPHY WITH MESH;  Surgeon: Jamesetta So, MD;  Location: [facility];  Service: General;  Laterality: N/A;   HPI:  62 year old woman with acute onset of severe vertigo along with nausea and gait instability.   Assessment / Plan / Recommendation Clinical Impression  Cognitive-linguistic evaluation complete. Patient currently functioning at baseline with mild short term memory impairment reported s/p brain surgery 20 years ago. Patient independently compensating in order to accurately recall 4 words following a 3, 5, and 7 minute delay. No f/u SLP services indicated at this time.     SLP Assessment  SLP Recommendation/Assessment: Patient does not need any further Speech Lanaguage Pathology Services SLP Visit Diagnosis: Cognitive communication deficit (R41.841)    Follow Up Recommendations  None          SLP Evaluation Cognition  Overall Cognitive Status: Within Functional Limits for tasks assessed       Comprehension  Auditory Comprehension Overall Auditory Comprehension: Appears within functional limits for tasks assessed Visual Recognition/Discrimination Discrimination: Within Function Limits Reading Comprehension Reading Status: Not tested    Expression Expression Primary Mode of Expression: Verbal Verbal Expression Overall Verbal Expression: Appears within functional limits for tasks assessed   Oral / Motor  Oral Motor/Sensory  Function Overall Oral Motor/Sensory Function: Within functional limits Motor Speech Overall Motor Speech: Appears within functional limits for tasks assessed   GO          Gabriel Rainwater [place], CCC-SLP [phone]           Yosgart Pavey Meryl 08/01/2017, 12:37 PM

## 2017-08-01 NOTE — Plan of Care (Signed)
Physical Therapy goals established for safe d/c to home.

## 2017-08-01 NOTE — Progress Notes (Signed)
  Echocardiogram 2D Echocardiogram has been performed.  Merrie Roof F 08/01/2017, 1:41 PM

## 2017-08-01 NOTE — ED Notes (Signed)
Swallow screen done by [facility] on previous shift.  Passed without difficulty.

## 2017-08-01 NOTE — ED Notes (Signed)
Attempted Report 

## 2017-08-01 NOTE — Evaluation (Signed)
Physical Therapy Evaluation Patient Details Name: Deanna Thompson MRN: [id number] DOB: April 09, 1955 Today's Date: 08/01/2017   History of Present Illness  is a 62 y.o. female with medical history significant of hypertension, GERD, depression, brain aneurysm with clip and coil, liver cirrhosis, hepatic encephalopathy, alcohol abuse in remission, IBS, Sjogren's disease, CKD 3, who presents with dizziness.  Clinical Impression  Pt admitted with above complications. An interesting case. Patient reports feeling much better however still feels a bit dizzy with standing and walking (more motion sensitivity than previous vertiginous symptoms.) Guarded gait with minimal head movement and very mild ataxic presentation, which improves with simple verbal cues, no loss of balance, even with vertical and horizontal head turns. A positive head impulse test towards the left was elicited upon my evaluation; right horizontal and torsional nystagmus with right gaze and upward gaze; absent with left gaze. Skew negative. She reports a GI virus in the past couple of weeks. If CVA is ruled out, these findings could be explained by a left sided vestibular neuronitis.Coordination of UEs and LEs were WNL. History and evaluation not consistent with BPPV which she has had in the past. We will continue to follow patient while admitted however based on current symptoms pt will benefit from skilled PT at an outpatient clinic to assist with recovery of symptoms and function.    Follow Up Recommendations Outpatient PT(neuro/vestibular rehab)    Equipment Recommendations  None recommended by PT    Recommendations for Other Services       Precautions / Restrictions Precautions Precautions: Fall Restrictions Weight Bearing Restrictions: No      Mobility  Bed Mobility Overal bed mobility: Independent                Transfers Overall transfer level: Modified independent                   Ambulation/Gait Ambulation/Gait assistance: Supervision Ambulation Distance (Feet): 150 Feet Assistive device: None Gait Pattern/deviations: Decreased stride length;Wide base of support;Drifts right/left   Gait velocity interpretation: 1.31 - 2.62 ft/sec, indicative of limited community ambulator General Gait Details: Very guarded with minimal head movements however when cued for horizontal and vertical head turns pt without loss of balance. Mild drifting to Lt and Rt but no significant ataxia appreciated. No buckling or loss of balance although slow. Able to improve stride with cues.  Stairs            Wheelchair Mobility    Modified Rankin (Stroke Patients Only) Modified Rankin (Stroke Patients Only) Pre-Morbid Rankin Score: No significant disability Modified Rankin: Moderate disability     Balance Overall balance assessment: Needs assistance Sitting-balance support: Feet supported;No upper extremity supported Sitting balance-Leahy Scale: Normal     Standing balance support: No upper extremity supported Standing balance-Leahy Scale: Normal   Single Leg Stance - Right Leg: 5 Single Leg Stance - Left Leg: 2 Tandem Stance - Right Leg: 10 Tandem Stance - Left Leg: 10 [place] - Eyes Opened: 10 [place] - Eyes Closed: 10   High Level Balance Comments: Vertical and horizontal head turns with gait, no loss of balance.             Pertinent Vitals/Pain Pain Assessment: No/denies pain    Home Living Family/patient expects to be discharged to:: Private residence Living Arrangements: Spouse/significant other Available Help at Discharge: Available 24 hours/day Type of Home: House Home Access: Stairs to enter Entrance Stairs-Rails: Psychiatric nurse of Steps: 5 Home Layout: Two level;Able to live  on main level with bedroom/bathroom Home Equipment: None      Prior Function Level of Independence: Independent               Hand Dominance         Extremity/Trunk Assessment   Upper Extremity Assessment Upper Extremity Assessment: Defer to OT evaluation    Lower Extremity Assessment Lower Extremity Assessment: Overall WFL for tasks assessed    Cervical / Trunk Assessment Cervical / Trunk Assessment: Normal  Communication   Communication: No difficulties  Cognition Arousal/Alertness: Awake/alert Behavior During Therapy: WFL for tasks assessed/performed Overall Cognitive Status: Within Functional Limits for tasks assessed                                        General Comments General comments (skin integrity, edema, etc.): + head thrust left, no skew deviation, rapid alternating movements with hands and feet WNL, FNF normal, heel shin test normal, Nystagmus noted with Rt gaze (right horizontal beat), nystagmus noted with upward gaze (Rt rotational beat), surpressed nystagmus with left gaze.    Exercises     Assessment/Plan    PT Assessment Patient needs continued PT services  PT Problem List Decreased balance;Decreased mobility;Decreased coordination       PT Treatment Interventions DME instruction;Gait training;Stair training;Functional mobility training;Therapeutic activities;Therapeutic exercise;Balance training;Neuromuscular re-education;Patient/family education    PT Goals (Current goals can be found in the Care Plan section)  Acute Rehab PT Goals Patient Stated Goal: feel better PT Goal Formulation: With patient Time For Goal Achievement: 08/15/17 Potential to Achieve Goals: Good    Frequency Min 4X/week   Barriers to discharge        Co-evaluation               AM-PAC PT "6 Clicks" Daily Activity  Outcome Measure Difficulty turning over in bed (including adjusting bedclothes, sheets and blankets)?: None Difficulty moving from lying on back to sitting on the side of the bed? : None Difficulty sitting down on and standing up from a chair with arms (e.g., wheelchair,  bedside commode, etc,.)?: A Little Help needed moving to and from a bed to chair (including a wheelchair)?: None Help needed walking in hospital room?: None Help needed climbing 3-5 steps with a railing? : None 6 Click Score: 23    End of Session   Activity Tolerance: Patient tolerated treatment well Patient left: in bed;with call bell/phone within reach(echo in room for test)   PT Visit Diagnosis: Unsteadiness on feet (R26.81);Dizziness and giddiness (R42)    Time: 5732-2025 PT Time Calculation (min) (ACUTE ONLY): 16 min   Charges:   PT Evaluation $PT Eval Moderate Complexity: 1 Mod     PT G Codes:   PT G-Codes **NOT FOR INPATIENT CLASS** Functional Assessment Tool Used: AM-PAC 6 Clicks Basic Mobility;Clinical judgement Functional Limitation: Mobility: Walking and moving around Mobility: Walking and Moving Around Current Status (K2706): At least 1 percent but less than 20 percent impaired, limited or restricted Mobility: Walking and Moving Around Goal Status [phone]): At least 1 percent but less than 20 percent impaired, limited or restricted    Elayne Snare, PT, DPT Cell [phone]  Ellouise Newer 08/01/2017, 12:58 PM

## 2017-08-01 NOTE — Progress Notes (Signed)
Occupational Therapy Evaluation Patient Details Name: Deanna Thompson MRN: [id number] DOB: 06/16/1955 Today's Date: 08/01/2017    History of Present Illness is a 62 y.o. female with medical history significant of hypertension, GERD, depression, brain aneurysm with clip and coil, liver cirrhosis, hepatic encephalopathy, alcohol abuse in remission, IBS, Sjogren's disease, CKD 3, who presents with dizziness.   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 Pt states that she was experiencing horizontal diplopia and  feeling like the room was spinning, even if she wasn't moving. Diplopis has resolved and pt reports mild return of spinning sensation when she bends toward floor or moves head quickly.  Pt presents with mildly ataxic gait and states she feels "disoriented" when walking. Pt reports she feels "steady" when using a RW.  Completed education with pt/family regarding recommendations for AD:/ IADL tasks and  to reduce risk of falls at home. Recommend pt refrain from driving at this time until cleared by MD.     Follow Up Recommendations  No OT follow up;Supervision - Intermittent(with mobility for ADL)    Equipment Recommendations  Other (comment)(RW)    Recommendations for Other Services       Precautions / Restrictions Precautions Precautions: Fall Restrictions Weight Bearing Restrictions: No      Mobility Bed Mobility Overal bed mobility: Modified Independent                Transfers Overall transfer level: Needs assistance Equipment used: Rolling walker (2 wheeled) Transfers: Sit to/from Stand                Balance                                           ADL either performed or assessed with clinical judgement   ADL Overall ADL's : Needs assistance/impaired     Grooming: Set up;Supervision/safety;Standing   Upper Body Bathing: Set up;Supervision/  safety;Sitting   Lower Body Bathing: Supervison/ safety;Set up;Sit to/from stand   Upper Body Dressing : Set up;Sitting   Lower Body Dressing: Set up;Supervision/safety;Sit to/from stand   Toilet Transfer: Supervision/safety;Ambulation   Toileting- Clothing Manipulation and Hygiene: Modified independent       Functional mobility during ADLs: Supervision/safety;Rolling walker General ADL Comments: recommend pt use shower seat for bathing and have S with transfers  Educated pt on need to bring feet up to her at this time instead of bending down toward feet and to sit with bathing/dressing     Vision Baseline Vision/History: Wears glasses Wears Glasses: At all times Additional Comments: reports diplopia yesterday which has resolved     Perception     Praxis      Pertinent Vitals/Pain Pain Assessment: No/denies pain     Hand Dominance Right   Extremity/Trunk Assessment Upper Extremity Assessment Upper Extremity Assessment: RUE deficits/detail RUE Deficits / Details: complains of dropping items with her R hand   Lower Extremity Assessment Lower Extremity Assessment: Defer to PT evaluation   Cervical / Trunk Assessment Cervical / Trunk Assessment: Normal   Communication Communication Communication: No difficulties   Cognition Arousal/Alertness: Awake/alert Behavior During Therapy: WFL for tasks assessed/performed Overall Cognitive Status: Within Functional Limits for tasks assessed  General Comments  Ataxic gait when ambulating; furniture walking; " I feel disoriented"; increased symptoms with forward head/bending over to floor    Exercises     Shoulder Instructions      Home Living Family/patient expects to be discharged to:: Private residence Living Arrangements: Spouse/significant other Available Help at Discharge: Available 24 hours/day Type of Home: House Home Access: Stairs to enter [employer] of Steps: 5 Entrance Stairs-Rails: Right;Left Home Layout: Two level;Able to live on main level with bedroom/bathroom     Bathroom Shower/Tub: Tub/shower unit;Curtain   Biochemist, clinical: Standard Bathroom Accessibility: Yes How Accessible: Accessible via walker Home Equipment: None          Prior Functioning/Environment Level of Independence: Independent                 OT Problem List: Impaired balance (sitting and/or standing)      OT Treatment/Interventions:      OT Goals(Current goals can be found in the care plan section) Acute Rehab OT Goals Patient Stated Goal: feel better OT Goal Formulation: All assessment and education complete, DC therapy  OT Frequency:     Barriers to D/C:            Co-evaluation              AM-PAC PT "6 Clicks" Daily Activity     Outcome Measure Help from another person eating meals?: None Help from another person taking care of personal grooming?: None Help from another person toileting, which includes using toliet, bedpan, or urinal?: A Little Help from another person bathing (including washing, rinsing, drying)?: A Little Help from another person to put on and taking off regular upper body clothing?: None Help from another person to put on and taking off regular lower body clothing?: None 6 Click Score: 22   End of Session Equipment Utilized During Treatment: Gait belt;Rolling walker Nurse Communication: Mobility status  Activity Tolerance:   Patient left: in bed;with call bell/phone within reach;with family/visitor present  OT Visit Diagnosis: Unsteadiness on feet (R26.81);Dizziness and giddiness (R42)                Time: 1730-1756 OT Time Calculation (min): 26 min Charges:  OT General Charges $OT Visit: 1 Visit OT Evaluation $OT Eval Low Complexity: 1 Low OT Treatments $Self Care/Home Management : 8-22 mins G-Codes:     [facility], OT/L  [phone] 08/01/2017  Alexsandro Salek,HILLARY 08/01/2017, 6:45  PM

## 2017-08-01 NOTE — ED Notes (Signed)
Report called [facility]).

## 2017-08-01 NOTE — Progress Notes (Addendum)
Neurology Progress Note  Reason for Consult: Dizziness vertigo double vision  CC: Severe dizziness  HPI: Deanna Thompson is a 62 y.o. 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 She presented on evening of 5/4 with >12hrs of severe dizziness, which she describes as room spinning. She did not get relief with medications that have been given-Valium and meclizine. Denies headache. Reports N/V at time of onset as well.   She reports that she has had BPPV in the past where she gets a vertiginous sensation that only last a few seconds and never has had such persistent symptoms. Her symptoms do get worse when she tries to get up and walk; gait was ataxic per ER staff.  She also reports double vision. She is a known patient of Dr. Estanislado Pandy, who is also treated her aneurysms with coiling, and also known to Dr. Ellene Route.  Interval History: 5/5: Pt tells me that she feels 80% better, most of dizziness has stopped. She still has a mildly ataxic gait and nystagmus present on exam;  awaiting rest of stroke wk up and rehab evals.  ROS:ROS was performed and is negative except as noted in the HPI.   Medications  Current Facility-Administered Medications:  .   stroke: mapping our early stages of recovery book, , Does not apply, Once, Ivor Costa, MD .  0.9 %  sodium chloride infusion, , Intravenous, Continuous, Ivor Costa, MD, Last Rate: 75 mL/hr at 08/01/17 0012 .  acetaminophen (TYLENOL) tablet 650 mg, 650 mg, Oral, Q6H PRN, Ivor Costa, MD, 650 mg at 08/01/17 0030 .  aspirin EC tablet 81 mg, 81 mg, Oral, Daily, Rosalin Hawking, MD, 81 mg at 08/01/17 0917 .  atorvastatin (LIPITOR) tablet 80 mg, 80 mg, Oral, q1800, Ivor Costa, MD .  bumetanide (BUMEX) tablet 1 mg, 1 mg, Oral, BID, Ivor Costa, MD, 1 mg at  08/01/17 0916 .  buPROPion [facility] SR) 12 hr tablet 100 mg, 100 mg, Oral, BID, Ivor Costa, MD, 100 mg at 08/01/17 0011 .  docusate sodium (COLACE) capsule 100 mg, 100 mg, Oral, BID, Ivor Costa, MD, 100 mg at 08/01/17 0917 .  enoxaparin (LOVENOX) injection 40 mg, 40 mg, Subcutaneous, Q24H, Ivor Costa, MD, 40 mg at 08/01/17 0917 .  escitalopram (LEXAPRO) tablet 20 mg, 20 mg, Oral, QHS, Ivor Costa, MD, 20 mg at 08/01/17 0012 .  famotidine (PEPCID) tablet 20 mg, 20 mg, Oral, BID, Ivor Costa, MD, 20 mg at 08/01/17 0917 .  fluticasone (FLONASE) 50 MCG/ACT nasal spray 1 spray, 1 spray, Each Nare, Q2200, Ivor Costa, MD .  fluticasone (FLONASE) 50 MCG/ACT nasal spray 1 spray, 1 spray, Each Nare, Daily PRN, Cristal Deer, MD .  hydrALAZINE (APRESOLINE) injection 5 mg, 5 mg, Intravenous, Q2H PRN, Ivor Costa, MD .  hydrocortisone cream 1 % 1 application, 1 application, Topical, BID PRN, Ivor Costa, MD .  hydrOXYzine (ATARAX/VISTARIL) tablet 10 mg, 10 mg, Oral, TID PRN, Ivor Costa, MD .  Lifitegrast 5 % SOLN 1 drop, 1 drop, Both Eyes, BID, Ivor Costa, MD .  linaclotide Rolan Lipa) capsule 145 mcg, 145 mcg, Oral, QAC breakfast, Ivor Costa, MD, 145 mcg at 08/01/17 [phone] .  loratadine (CLARITIN) tablet 10 mg, 10 mg, Oral, Daily, Ivor Costa, MD, 10 mg at 08/01/17 0917 .  meclizine (ANTIVERT) tablet 25 mg, 25 mg, Oral,  TID PRN, Ivor Costa, MD .  multivitamin with minerals tablet 1 tablet, 1 tablet, Oral, Daily, Ivor Costa, MD, 1 tablet at 08/01/17 [phone] .  polyvinyl alcohol (LIQUIFILM TEARS) 1.4 % ophthalmic solution 1 drop, 1 drop, Both Eyes, TID PRN, Ivor Costa, MD .  senna-docusate (Senokot-S) tablet 1 tablet, 1 tablet, Oral, QHS PRN, Ivor Costa, MD .  simethicone (MYLICON) chewable tablet 80 mg, 80 mg, Oral, BID PRN, Ivor Costa, MD .  sodium chloride (OCEAN) 0.65 % nasal spray 1 spray, 1 spray, Nasal, BID PRN, Ivor Costa, MD .  spironolactone (ALDACTONE) tablet 100 mg, 100 mg, Oral, BID, Ivor Costa, MD, 100  mg at 08/01/17 0916 .  zolpidem (AMBIEN) tablet 5 mg, 5 mg, Oral, QHS PRN, Ivor Costa, MD  Exam: Current vital signs: BP 118/64 (BP Location: Right Arm)   Pulse 65   Temp 98.4 F (36.9 C) (Oral)   Resp 18   Ht 5\' 8"  (1.727 m)   Wt 103.4 kg (227 lb 15.3 oz)   SpO2 99%   BMI 34.66 kg/m  Vital signs in last 24 hours: Temp:  [97.4 F (36.3 C)-98.4 F (36.9 C)] 98.4 F (36.9 C) (05/05 0640) Pulse Rate:  [57-71] 65 (05/05 0907) Resp:  [9-20] 18 (05/05 0907) BP: (88-169)/(57-115) 118/64 (05/05 0907) SpO2:  [88 %-100 %] 99 % (05/05 0907) Weight:  [103 kg (227 lb)-103.4 kg (227 lb 15.3 oz)] 103.4 kg (227 lb 15.3 oz) (05/05 0640)  GENERAL: Awake, alert, appears to be in no distress HEENT: - Normocephalic and atraumatic, dry mm, no LN++, no Thyromegally LUNGS - Clear to auscultation bilaterally with no wheezes and no sob CV - S1S2 RRR, no m/r/g, equal pulses bilaterally. No edema ABDOMEN - Soft, nontender, nondistended with normoactive BS Ext: warm, well perfused, intact peripheral pulses, _no edema  NEURO:  Mental Status: AA&Ox3 Appropriate in conversation Language: speech is non-dysarthric.  Naming, repetition, fluency, and comprehension intact. Cranial Nerves: PERLLA, extra ocular movement exam shows mildly disconjugate gaze, NO nystagmus at rest this am, but there is some mild beating horizontal nystagmus while looking to the right, less noted when looking to left. No rotatory nystagmus while looking up. Denies any diplopia or blurred vision at this time. visual fields full, no facial asymmetry, facial sensation intact, hearing intact, tongue/uvula/soft palate midline, normal sternocleidomastoid and trapezius muscle strength. No evidence of tongue atrophy or fibrillations Motor: 5/5 throughout Tone: is normal and bulk is normal Sensation- Intact to light touch bilaterally, no extinction Coordination: FTN intact bilaterally, no ataxia in BLE. Gait- I got pt up to walk ~17ft, she is  mildly ataxic, double steps.  NIHSS 0  Labs I have reviewed labs in epic and the results pertinent to this consultation are:  CBC    Component Value Date/Time   WBC 10.8 (H) 07/31/2017 1319   RBC 4.47 07/31/2017 1319   HGB 13.8 07/31/2017 1319   HCT 41.0 07/31/2017 1319   HCT 41 07/30/2011 0743   PLT 207 07/31/2017 1319   MCV 91.7 07/31/2017 1319   MCV 104.3 07/30/2011 0743   MCH 30.9 07/31/2017 1319   MCHC 33.7 07/31/2017 1319   RDW 13.4 07/31/2017 1319   LYMPHSABS 1.2 06/29/2017 1117   MONOABS 0.5 06/29/2017 1117   EOSABS 0.2 06/29/2017 1117   BASOSABS 0.0 06/29/2017 1117    CMP     Component Value Date/Time   NA 140 07/31/2017 1319   NA 138 07/30/2011 0741   K 3.6 07/31/2017 1319  K 3.8 07/30/2011 0741   CL 103 07/31/2017 1319   CO2 20 (L) 07/31/2017 1319   GLUCOSE 159 (H) 07/31/2017 1319   BUN 20 07/31/2017 1319   BUN 4 07/30/2011 0741   CREATININE 1.26 (H) 07/31/2017 1319   CREATININE 1.31 (H) 01/19/2017 1550   CALCIUM 9.5 07/31/2017 1319   PROT 7.7 07/31/2017 1319   ALBUMIN 4.2 07/31/2017 1319   ALBUMIN 3.0 07/30/2011 0741   AST 22 07/31/2017 1319   AST 171 07/30/2011 0741   ALT 17 07/31/2017 1319   ALKPHOS 65 07/31/2017 1319   ALKPHOS 111 07/30/2011 0741   BILITOT 1.1 07/31/2017 1319   BILITOT 3.6 07/30/2011 0741   GFRNONAA 45 (L) 07/31/2017 1319   GFRNONAA 44 (L) 01/19/2017 1550   GFRAA 53 (L) 07/31/2017 1319   GFRAA 52 (L) 01/19/2017 1550   Imaging I have reviewed the images obtained: CT-scan of the brain-no acute changes.  Clipped top of the basilar aneurysm.  3 mm left MCA aneurysm that is untreated.  No acute bleed.  No evolving infarct. MRI cannot be performed because the clip  A/P: 63 year old woman with acute onset of severe vertigo along with nausea and gait instability. She has rotatory nystagmus on upward gaze as well as right beating nystagmus on rightward gaze with no skew deviation and negative head impulse, making me suspicious for  a central etiology like a brainstem stroke.  # Vertigo- evaluate for central pathology like brainstem stroke. Ddx, but less likely peripheral vertigo  -Unable to MRI to better evaluate brainstem at this time  -CTH neg for acute chagnes  -CTA: severe distal L V2 stenosis. Athero changes noted bilat carotids, but no significant stenosis. # History of subarachnoid hemorrhage due to ruptured top of the basilar aneurysm status post clipping and coiling. # Unsecured left MCA aneurysm 3 mm.  Followed by Dr. Estanislado Pandy # BPPV- has been previously treated by benzo, meclizine, antiemetics. This presentation is different and meds did not help # Hyperlipidemia- LDL 129, goal <100. Lipitor 80mg  started, should be carefully monitored by GI. Follow LFTs # HTN- not recommending permissive HTN at this time in setting of unsecured L MCA aneurysm and h/o SAH etc. Goal is <160 SPB # Liver cirrhosis- LFT and NH4 are wnl currently. follow labs. She was taken off of all antiplatelets because of deranged hepatic function.  She is concerned about starting aspirin or Plavix. She has agreed to starting low-dose ASA, but would like to further d/w GI.  Recommendations: -Continue tele and freq neurochecks -2D echocardiogram pending -Hemoglobin A1c pending -Physical therapy, Occupational Therapy and speech therapy -Vestibular rehab might be needed -Antiplatelets: Further recommendations will be per primary team a stroke team after discussing appropriateness in setting of liver cirrhosis -Atorvastatin 80 mg p.o. Daily should also be d/w GI in setting of liver disease. -Blood pressure management- usually in cases of acute stroke, permissive hypertension up to 220 is warranted but in her case because of history of subarachnoid's an aneurysm and an unsecured small aneurysm, we are recommend permissive hypertension only up to systolic blood pressure of 160.  -Treat PRN any blood pressures more than 160.  Blood pressure should be  normalized in 24 to 48 hours. -Can continue with pts already prev prescribed symptomatic treatment of vertigo with benzos, meclizine, antiemetics. However this may not help in this situation given this seems to be a central lesion vs BPPV  Please page stroke NP/PA/MD (listed on AMION)  from 8am-4 pm as this patient  will be followed by the stroke team at this point.  ATTENDING NOTE: I reviewed above note and agree with the assessment and plan. I have made any additions or clarifications directly to the above note. Pt was seen and examined.   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  Patient had a history of BPPV, which was short lasting.  However at this time, patient has gradual onset of dizziness and then became vertigo, nausea vomiting, diaphoresis as well as binocular diplopia, lasting more than 4 hours and then gradually getting better.  Currently at her baseline.  She stated that she had stomach viral infection 1 week ago, but very mild.  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 Estanislado Pandy for cerebral angiogram was about 10 years ago when she did experienced brief aphasia post procedure.  Her examination benign.  CTA head neck again showed left MCA 3 mm aneurysm at bifurcation, left nondominant VA V2 stenosis.  2D echo pending.  A1c 5.8 and LDL 129.  UDS pending.  Will repeat CT 24 hours after the first CT.  Put on aspirin and Lipitor, continue on discharge.  I had a long discussion with patient this time regarding her current event.  With vertigo, diplopia, ataxia, and is still more likely small brainstem central event.  BPPV not  associate with diplopia or ataxia in the not lasting this long.  Vestibular neuritis also not having diplopia but normally lasting longer than this.  I am not sure whether this is related to her basilar artery pathology status post coiling and clipping. DSA with Dr. Estanislado Pandy is an option but not necessarily change management.  After long discussion, given her clinical symptoms much improved and her previous experience of side effect of DSA, will hold off cerebral angiogram this time.   Rosalin Hawking, MD PhD Stroke Neurology 08/01/2017 3:19 PM

## 2017-08-01 NOTE — Progress Notes (Signed)
PROGRESS NOTE  Sabine AST:[id number] DOB: 08-08-1955 DOA: 07/31/2017 PCP: Mikey Kirschner, MD  HPI/Recap of past 24 hours:  HPI: [place] is a 62 y.o. female with medical history significant of hypertension, GERD, depression, brain aneurysm with clip and coil, liver cirrhosis, hepatic encephalopathy, alcohol abuse in remission, IBS, Sjogren's disease, CKD 3, who presents with dizziness.  It is associated with nausea and vomiting.  She also has fuzzy vision.  She feels like room spinning around her.  Denies unilateral weakness, numbness or tingling in the extremities.  No ear ringing or hearing loss.  No facial droop or slurred speech.  Patient had diaphoresis.  Patient denies chest pain, shortness of breath, cough, fever or chills.  Reported persistent moderate nausea and vomiting.No diarrhea or abdominal pain  She denies any dizziness no nausea or vomiting at this time  Assessment/Plan: Principal Problem:   Dizziness Active Problems:   Cirrhosis (HCC)   Depression   Essential hypertension   GERD (gastroesophageal reflux disease)   Alcohol abuse   Brain aneurysm   CKD (chronic kidney disease), stage III (HCC)   LBBB (left bundle branch block)   Nausea & vomiting   Stroke (cerebrum)  Dizziness: Neurology was consulted, Dr. Rory Percy think are likely due to central vertigo, possible brainstem stroke. He recommended stroke work-up.  Patient cannot do MRI due to brain aneurysm with clipping or coiling.  -Will place on tele bed for obs. -Highly appreciate Dr. Johny Chess consultation, will follow up recommendations as follows:  -Frequent neurochecks  -2D echocardiogram result pending  -Cannot get MRI- clipping incompatible  -Physical therapy, Occupational Therapy and speech therapy  -Vestibular rehab might be needed  -Antiplatelets-patient was taken off of all antiplatelets because of deranged hepatic function. She is concerned about starting aspirin or Plavix. We  did talk about starting low-dose aspirin. She would like this to be discussed with her GI doctor. Further recommendations will be per primary team a stroke team after discussing appropriateness again with the patient.  -Atorvastatin 80 mg p.o. Daily  -Blood pressure management- usually in cases of acute stroke, permissive hypertension up to 220 is warranted but in her case because of history of subarachnoid's an aneurysm and an unsecured small aneurysm, I would recommend permissive hypertension only up to systolic blood pressure of 160. Treat PRN any blood pressures more than 160. Blood pressure should be normalized in 24 to 48 hours. Symptomatic treatment of vertigo with benzos, meclizine, antiemetics.  Physical therapy has seen and recommended that based on current symptoms pt will benefit from skilled PT at an outpatient clinic to assist with recovery of symptoms and function  Speech therapy seen with no recommendation  2D echo report is still pending  Cirrhosis [facility]): Mental status normal.  No signs of hepatic encephalopathy. - hold Bumex and spironolactone tonight and resume tomorrow -check ammomia level  Depression: Stable, no suicidal or homicidal ideations. -Continue home medications: Lexapro            Essential hypertension: -- hold Bumex and spironolactone tonight and resume tomorrow -IV hydralazine as needed for SBP>160  GERD: -Pepcid  Alcohol abuse: -in remission since 2013  Brain aneurysm: Unsecured left MCA aneurysm 3 mm. Followed by Dr. Estanislado Pandy -f/u with Dr. Estanislado Pandy  CKD (chronic kidney disease), stage III [facility]): -f/u by CBC  Nausea & vomiting: seconday to possible brain stem stroke -zofran prn  new LBBB (left bundle branch block): No chest pain or SOB. Trop negative. Dr. Doylene Canard was consulted. -f/u card  recommendations -trop x 3 -f/u A1c, FLP, 2d echo.   Physical therapy has seen and recommended that based on current symptoms pt will  benefit from skilled PT at an outpatient clinic to assist with recovery of symptoms and function Speech therapy seen with no recommendation    DVT ppx: SQ Lovenox Code Status: Full code Family Communication: None at bed side.     Disposition Plan:  Anticipate discharge back to previous home environment Consults called:  Dr. Rory Percy of neuro and Dr. Doylene Canard of Card Admission status: Obs / tele       Objective: Vitals:   08/01/17 0330 08/01/17 0430 08/01/17 0640 08/01/17 0907  BP: 129/71 131/69 (!) 88/63 118/64  Pulse: 66 65 (!) 57 65  Resp: 16 16 18 18   Temp:   98.4 F (36.9 C)   TempSrc:   Oral   SpO2: 94% 92% 100% 99%  Weight:   103.4 kg (227 lb 15.3 oz)   Height:   5\' 8"  (1.727 m)    No intake or output data in the 24 hours ending 08/01/17 1434 Filed Weights   07/31/17 1308 08/01/17 0640  Weight: 103 kg (227 lb) 103.4 kg (227 lb 15.3 oz)   Body mass index is 34.66 kg/m.  Exam:   General: Not in acute distress HEENT:       Eyes: PERRL, EOMI, no scleral icterus.       ENT: No discharge from the ears and nose, no pharynx injection, no tonsillar enlargement.        Neck: No JVD, no bruit, no mass felt. Heme: No neck lymph node enlargement. Cardiac: S1/S2, RRR, No murmurs, No gallops or rubs. Respiratory: No rales, wheezing, rhonchi or rubs. GI: Soft, nondistended, nontender, no rebound pain, no organomegaly, BS present. GU: No hematuria Ext: No pitting leg edema bilaterally. 2+DP/PT pulse bilaterally. Musculoskeletal: No joint deformities, No joint redness or warmth, no limitation of ROM in spin. Skin: No rashes.  Neuro: Alert, oriented X3, cranial nerves II-XII grossly intact, moves all extremities normally. Muscle strength 5/5 in all extremities, sensation to light touch intact. Brachial reflex 2+ bilaterally. Negative Babinski's sign. Normal finger to nose test. Psych: Patient is not psychotic, no suicidal or hemocidal    Data Reviewed: CBC: Recent Labs  Lab  07/31/17 1319  WBC 10.8*  HGB 13.8  HCT 41.0  MCV 91.7  PLT 195   Basic Metabolic Panel: Recent Labs  Lab 07/31/17 1319  NA 140  K 3.6  CL 103  CO2 20*  GLUCOSE 159*  BUN 20  CREATININE 1.26*  CALCIUM 9.5   GFR: Estimated Creatinine Clearance: 59.7 mL/min (A) (by C-G formula based on SCr of 1.26 mg/dL (H)). Liver Function Tests: Recent Labs  Lab 07/31/17 1319  AST 22  ALT 17  ALKPHOS 65  BILITOT 1.1  PROT 7.7  ALBUMIN 4.2   Recent Labs  Lab 07/31/17 1319  LIPASE 37   Recent Labs  Lab 08/01/17 0213  AMMONIA 20   Coagulation Profile: No results for input(s): INR, PROTIME in the last 168 hours. Cardiac Enzymes: Recent Labs  Lab 08/01/17 0213 08/01/17 0516 08/01/17 0729  TROPONINI <0.03 <0.03 <0.03   BNP (last 3 results) No results for input(s): PROBNP in the last 8760 hours. HbA1C: Recent Labs    08/01/17 0500  HGBA1C 5.8*   CBG: No results for input(s): GLUCAP in the last 168 hours. Lipid Profile: Recent Labs    08/01/17 0500  CHOL 199  HDL 50  LDLCALC 129*  TRIG 102  CHOLHDL 4.0   Thyroid Function Tests: No results for input(s): TSH, T4TOTAL, FREET4, T3FREE, THYROIDAB in the last 72 hours. Anemia Panel: No results for input(s): VITAMINB12, FOLATE, FERRITIN, TIBC, IRON, RETICCTPCT in the last 72 hours. Urine analysis:    Component Value Date/Time   COLORURINE YELLOW 08/01/2017 0025   APPEARANCEUR CLEAR 08/01/2017 0025   LABSPEC 1.039 (H) 08/01/2017 0025   PHURINE 6.0 08/01/2017 0025   GLUCOSEU NEGATIVE 08/01/2017 0025   HGBUR NEGATIVE 08/01/2017 0025   BILIRUBINUR NEGATIVE 08/01/2017 0025   KETONESUR 5 (A) 08/01/2017 0025   PROTEINUR NEGATIVE 08/01/2017 0025   UROBILINOGEN 0.2 06/08/2013 1943   NITRITE NEGATIVE 08/01/2017 0025   LEUKOCYTESUR NEGATIVE 08/01/2017 0025   Sepsis Labs: @LABRCNTIP (procalcitonin:4,lacticidven:4)  )No results found for this or any previous visit (from the past 240 hour(s)).    Studies: Ct  Angio Head W Or Wo Contrast  Result Date: 07/31/2017 CLINICAL DATA:  Initial evaluation for acute dizziness. History of treated basilar artery aneurysm EXAM: CT ANGIOGRAPHY HEAD AND NECK TECHNIQUE: Multidetector CT imaging of the head and neck was performed using the standard protocol during bolus administration of intravenous contrast. Multiplanar CT image reconstructions and MIPs were obtained to evaluate the vascular anatomy. Carotid stenosis measurements (when applicable) are obtained utilizing NASCET criteria, using the distal internal carotid diameter as the denominator. CONTRAST:  27mL ISOVUE-370 IOPAMIDOL (ISOVUE-370) INJECTION 76% COMPARISON:  Prior CT from earlier the same day. FINDINGS: CTA NECK FINDINGS Aortic arch: Visualized aortic arch of normal caliber with normal branch pattern. Atheromatous plaque within the visualize arch and about the origin of the great vessels without hemodynamically significant stenosis. Visualized subclavian arteries widely patent. Right carotid system: Right common carotid artery patent from its origin to the bifurcation without stenosis. Mixed plaque about the right bifurcation/proximal right ICA without hemodynamically significant stenosis. Right ICA patent distally to the skull base without stenosis, dissection, or occlusion. Left carotid system: Left common carotid artery patent from its origin to the bifurcation without stenosis. Mild scattered plaque about the left bifurcation without hemodynamically significant stenosis. Left ICA patent from the bifurcation to the skull base without stenosis, dissection, or occlusion. Vertebral arteries: Both of the vertebral arteries arise from the subclavian arteries. Right vertebral artery dominant. Mild plaque at the origin of the vertebral arteries with no more than mild narrowing. There is focal tortuosity with a short-segment severe stenosis involving the left vertebral artery at the level of the C2 transverse foramen (series  8, image 115). Vertebral arteries otherwise patent without hemodynamically significant stenosis or occlusion. Skeleton: No acute osseous abnormality. No worrisome lytic or blastic osseous lesions. Mild-to-moderate degenerative spondylolysis present at C5-6 and C6-7. Other neck: No other acute soft tissue abnormality within the neck. No adenopathy. Salivary glands normal. Thyroid gland normal. Upper chest: Visualized upper chest within normal limits. Partially visualized lungs are clear. Mild centrilobular emphysema. Review of the MIP images confirms the above findings CTA HEAD FINDINGS Anterior circulation: Petrous segments widely patent bilaterally. Scattered atheromatous plaque within the cavernous/supraclinoid ICAs with mild moderate multifocal narrowing, worse on the right. ICA termini widely patent. Dominant right A1 segment, accounting for a slightly diminutive left ICA is compared to the right. Normal anterior communicating artery. Anterior cerebral arteries widely patent to their distal aspects. Median artery of corpus callosum noted. Right M1 segment patent without stenosis or occlusion. Normal right MCA bifurcation. Distal right MCA branches well perfused. Left M1 segment patent without stenosis. Approximate 3 mm aneurysm seen  arising from the left MCA bifurcation (series 8, image 84). This projects laterally and superiorly. Left MCA branches well perfused distally. Posterior circulation: Vertebral arteries patent to the vertebrobasilar junction without stenosis. Right vertebral artery dominant. Posterior inferior cerebral arteries patent bilaterally. Basilar artery patent to its distal aspect. Streak artifact from coil pack from prior treated basilar apex aneurysm. Adjacent surgical clips present. No appreciable remnant or evidence for recanalization identified, although evaluation limited by streak artifact. Origin of the superior cerebral arteries not visualized, although appear to be patent distally.  Similarly, proximal PCAs not well visualize, although also appear to be widely patent distally and perfused to their distal aspects. Venous sinuses: Patent. Anatomic variants: None significant. Delayed phase: Probable 7 mm calcified meningioma at the anterior falx again noted. No other abnormal enhancement. Right frontotemporal encephalomalacia again noted. Small remote left cerebellar infarct. Review of the MIP images confirms the above findings IMPRESSION: 1. Negative CTA for large vessel occlusion. 2. Treated basilar apex aneurysm. No remnant or CTA evidence for recanalization identified. 3. 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 4. Single short-segment severe distal left V2 stenosis. Vertebral arteries otherwise widely patent. Right vertebral artery dominant. 5. Atherosclerotic change about the carotid bifurcations and within the carotid siphons without high-grade stenosis. Electronically Signed   By: Jeannine Boga M.D.   On: 07/31/2017 21:03   Ct Angio Neck W And/or Wo Contrast  Result Date: 07/31/2017 CLINICAL DATA:  Initial evaluation for acute dizziness. History of treated basilar artery aneurysm EXAM: CT ANGIOGRAPHY HEAD AND NECK TECHNIQUE: Multidetector CT imaging of the head and neck was performed using the standard protocol during bolus administration of intravenous contrast. Multiplanar CT image reconstructions and MIPs were obtained to evaluate the vascular anatomy. Carotid stenosis measurements (when applicable) are obtained utilizing NASCET criteria, using the distal internal carotid diameter as the denominator. CONTRAST:  70mL ISOVUE-370 IOPAMIDOL (ISOVUE-370) INJECTION 76% COMPARISON:  Prior CT from earlier the same day. FINDINGS: CTA NECK FINDINGS Aortic arch: Visualized aortic arch of normal caliber with normal branch pattern. Atheromatous plaque within the visualize arch and about the origin of the great vessels without hemodynamically significant stenosis. Visualized subclavian  arteries widely patent. Right carotid system: Right common carotid artery patent from its origin to the bifurcation without stenosis. Mixed plaque about the right bifurcation/proximal right ICA without hemodynamically significant stenosis. Right ICA patent distally to the skull base without stenosis, dissection, or occlusion. Left carotid system: Left common carotid artery patent from its origin to the bifurcation without stenosis. Mild scattered plaque about the left bifurcation without hemodynamically significant stenosis. Left ICA patent from the bifurcation to the skull base without stenosis, dissection, or occlusion. Vertebral arteries: Both of the vertebral arteries arise from the subclavian arteries. Right vertebral artery dominant. Mild plaque at the origin of the vertebral arteries with no more than mild narrowing. There is focal tortuosity with a short-segment severe stenosis involving the left vertebral artery at the level of the C2 transverse foramen (series 8, image 115). Vertebral arteries otherwise patent without hemodynamically significant stenosis or occlusion. Skeleton: No acute osseous abnormality. No worrisome lytic or blastic osseous lesions. Mild-to-moderate degenerative spondylolysis present at C5-6 and C6-7. Other neck: No other acute soft tissue abnormality within the neck. No adenopathy. Salivary glands normal. Thyroid gland normal. Upper chest: Visualized upper chest within normal limits. Partially visualized lungs are clear. Mild centrilobular emphysema. Review of the MIP images confirms the above findings CTA HEAD FINDINGS Anterior circulation: Petrous segments widely patent bilaterally. Scattered atheromatous  plaque within the cavernous/supraclinoid ICAs with mild moderate multifocal narrowing, worse on the right. ICA termini widely patent. Dominant right A1 segment, accounting for a slightly diminutive left ICA is compared to the right. Normal anterior communicating artery. Anterior  cerebral arteries widely patent to their distal aspects. Median artery of corpus callosum noted. Right M1 segment patent without stenosis or occlusion. Normal right MCA bifurcation. Distal right MCA branches well perfused. Left M1 segment patent without stenosis. Approximate 3 mm aneurysm seen arising from the left MCA bifurcation (series 8, image 84). This projects laterally and superiorly. Left MCA branches well perfused distally. Posterior circulation: Vertebral arteries patent to the vertebrobasilar junction without stenosis. Right vertebral artery dominant. Posterior inferior cerebral arteries patent bilaterally. Basilar artery patent to its distal aspect. Streak artifact from coil pack from prior treated basilar apex aneurysm. Adjacent surgical clips present. No appreciable remnant or evidence for recanalization identified, although evaluation limited by streak artifact. Origin of the superior cerebral arteries not visualized, although appear to be patent distally. Similarly, proximal PCAs not well visualize, although also appear to be widely patent distally and perfused to their distal aspects. Venous sinuses: Patent. Anatomic variants: None significant. Delayed phase: Probable 7 mm calcified meningioma at the anterior falx again noted. No other abnormal enhancement. Right frontotemporal encephalomalacia again noted. Small remote left cerebellar infarct. Review of the MIP images confirms the above findings IMPRESSION: 1. Negative CTA for large vessel occlusion. 2. Treated basilar apex aneurysm. No remnant or CTA evidence for recanalization identified. 3. 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 4. Single short-segment severe distal left V2 stenosis. Vertebral arteries otherwise widely patent. Right vertebral artery dominant. 5. Atherosclerotic change about the carotid bifurcations and within the carotid siphons without high-grade stenosis. Electronically Signed   By: Jeannine Boga M.D.   On:  07/31/2017 21:03    Scheduled Meds: .  stroke: mapping our early stages of recovery book   Does not apply Once  . aspirin EC  81 mg Oral Daily  . atorvastatin  80 mg Oral q1800  . bumetanide  1 mg Oral BID  . buPROPion  100 mg Oral BID  . docusate sodium  100 mg Oral BID  . enoxaparin (LOVENOX) injection  40 mg Subcutaneous Q24H  . escitalopram  20 mg Oral QHS  . famotidine  20 mg Oral BID  . fluticasone  1 spray Each Nare Q2200  . Lifitegrast  1 drop Both Eyes BID  . linaclotide  145 mcg Oral QAC breakfast  . loratadine  10 mg Oral Daily  . multivitamin with minerals  1 tablet Oral Daily  . spironolactone  100 mg Oral BID    Continuous Infusions: . sodium chloride 75 mL/hr at 08/01/17 0012     LOS: 0 days     Cristal Deer, MD [facility]  To reach me or the doctor on call, go to: [email or web address] Password [facility]  08/01/2017, 2:34 PM

## 2017-08-02 ENCOUNTER — Encounter (HOSPITAL_COMMUNITY): Payer: Self-pay | Admitting: *Deleted

## 2017-08-02 ENCOUNTER — Observation Stay (HOSPITAL_COMMUNITY): Payer: Commercial Managed Care - PPO

## 2017-08-02 DIAGNOSIS — F3341 Major depressive disorder, recurrent, in partial remission: Secondary | ICD-10-CM

## 2017-08-02 DIAGNOSIS — R42 Dizziness and giddiness: Secondary | ICD-10-CM | POA: Diagnosis not present

## 2017-08-02 DIAGNOSIS — I447 Left bundle-branch block, unspecified: Secondary | ICD-10-CM | POA: Diagnosis not present

## 2017-08-02 DIAGNOSIS — N183 Chronic kidney disease, stage 3 (moderate): Secondary | ICD-10-CM | POA: Diagnosis not present

## 2017-08-02 DIAGNOSIS — I10 Essential (primary) hypertension: Secondary | ICD-10-CM | POA: Diagnosis not present

## 2017-08-02 DIAGNOSIS — K219 Gastro-esophageal reflux disease without esophagitis: Secondary | ICD-10-CM | POA: Diagnosis not present

## 2017-08-02 DIAGNOSIS — H8110 Benign paroxysmal vertigo, unspecified ear: Secondary | ICD-10-CM

## 2017-08-02 DIAGNOSIS — I671 Cerebral aneurysm, nonruptured: Secondary | ICD-10-CM | POA: Diagnosis not present

## 2017-08-02 DIAGNOSIS — K7030 Alcoholic cirrhosis of liver without ascites: Secondary | ICD-10-CM | POA: Diagnosis not present

## 2017-08-02 DIAGNOSIS — R112 Nausea with vomiting, unspecified: Secondary | ICD-10-CM | POA: Diagnosis not present

## 2017-08-02 MED ORDER — ATORVASTATIN CALCIUM 40 MG PO TABS: 40.0000 mg | ORAL_TABLET | Freq: Every day | ORAL | 0 refills | Status: DC

## 2017-08-02 MED ORDER — ONDANSETRON HCL 4 MG PO TABS: 4.0000 mg | ORAL_TABLET | Freq: Three times a day (TID) | ORAL | 0 refills | Status: DC | PRN

## 2017-08-02 MED ORDER — LOSARTAN POTASSIUM 25 MG PO TABS: 25.0000 mg | ORAL_TABLET | Freq: Every day | ORAL | 0 refills | Status: DC

## 2017-08-02 MED ORDER — ASPIRIN 81 MG PO TBEC: 81.0000 mg | DELAYED_RELEASE_TABLET | Freq: Every day | ORAL | 0 refills | Status: DC

## 2017-08-02 MED ORDER — MECLIZINE HCL 25 MG PO TABS: 25.0000 mg | ORAL_TABLET | Freq: Three times a day (TID) | ORAL | 0 refills | Status: DC | PRN

## 2017-08-02 NOTE — Consult Note (Signed)
Ref: Mikey Kirschner, MD   Subjective:  Decreased dizziness and vision trouble post Epley maneuver. No chest pain. H/O exertional dyspnea.  Objective:  Vital Signs in the last 24 hours: Temp:  [97.7 F (36.5 C)-98.4 F (36.9 C)] 97.7 F (36.5 C) (05/06 0410) Pulse Rate:  [53-103] 57 (05/06 0906) Cardiac Rhythm: Heart block (05/06 0800) Resp:  [15-19] 15 (05/06 0906) BP: (96-113)/(53-85) 100/64 (05/06 0906) SpO2:  [96 %-100 %] 100 % (05/06 0906)  Physical Exam: BP Readings from Last 1 Encounters:  08/02/17 100/64     Wt Readings from Last 1 Encounters:  08/01/17 103.4 kg (227 lb 15.3 oz)    Weight change:  Body mass index is 34.66 kg/m. HEENT: [place]/AT, Eyes-Hazel, Conjunctiva-Pink, Sclera-Non-icteric Neck: No JVD, No bruit, Trachea midline. Lungs:  Clear, Bilateral. Cardiac:  Regular rhythm, normal S1 and S2, no S3. II/VI systolic murmur. Abdomen:  Soft, non-tender. BS present. Extremities:  No edema present. No cyanosis. No clubbing. CNS: AxOx3, Cranial nerves grossly intact, moves all 4 extremities.  Skin: Warm and dry.   Intake/Output from previous day: 05/05 0701 - 05/06 0700 In: 150 [P.O.:150] Out: -     Lab Results: BMET    Component Value Date/Time   NA 140 07/31/2017 1319   NA 137 06/29/2017 1117   NA 135 04/02/2017 1222   NA 138 07/30/2011 0741   K 3.6 07/31/2017 1319   K 4.1 06/29/2017 1117   K 4.2 04/02/2017 1222   K 3.8 07/30/2011 0741   CL 103 07/31/2017 1319   CL 101 06/29/2017 1117   CL 99 (L) 04/02/2017 1222   CO2 20 (L) 07/31/2017 1319   CO2 22 06/29/2017 1117   CO2 22 04/02/2017 1222   GLUCOSE 159 (H) 07/31/2017 1319   GLUCOSE 114 (H) 06/29/2017 1117   GLUCOSE 106 (H) 04/02/2017 1222   BUN 20 07/31/2017 1319   BUN 18 06/29/2017 1117   BUN 21 (H) 04/02/2017 1222   BUN 4 07/30/2011 0741   CREATININE 1.26 (H) 07/31/2017 1319   CREATININE 1.19 (H) 06/29/2017 1117   CREATININE 1.22 (H) 04/02/2017 1222   CREATININE 1.31 (H) 01/19/2017  1550   CREATININE 1.33 (H) 08/05/2015 1310   CREATININE 1.05 02/15/2015 1228   CALCIUM 9.5 07/31/2017 1319   CALCIUM 9.9 06/29/2017 1117   CALCIUM 9.7 04/02/2017 1222   GFRNONAA 45 (L) 07/31/2017 1319   GFRNONAA 49 (L) 06/29/2017 1117   GFRNONAA 47 (L) 04/02/2017 1222   GFRNONAA 44 (L) 01/19/2017 1550   GFRNONAA 59 (L) 02/15/2015 1228   GFRNONAA 55 (L) 12/16/2012 1116   GFRAA 53 (L) 07/31/2017 1319   GFRAA 56 (L) 06/29/2017 1117   GFRAA 55 (L) 04/02/2017 1222   GFRAA 52 (L) 01/19/2017 1550   GFRAA 68 02/15/2015 1228   GFRAA 64 12/16/2012 1116   CBC    Component Value Date/Time   WBC 10.8 (H) 07/31/2017 1319   RBC 4.47 07/31/2017 1319   HGB 13.8 07/31/2017 1319   HCT 41.0 07/31/2017 1319   HCT 41 07/30/2011 0743   PLT 207 07/31/2017 1319   MCV 91.7 07/31/2017 1319   MCV 104.3 07/30/2011 0743   MCH 30.9 07/31/2017 1319   MCHC 33.7 07/31/2017 1319   RDW 13.4 07/31/2017 1319   LYMPHSABS 1.2 06/29/2017 1117   MONOABS 0.5 06/29/2017 1117   EOSABS 0.2 06/29/2017 1117   BASOSABS 0.0 06/29/2017 1117   HEPATIC Function Panel Recent Labs    01/19/17 1550 06/29/17 1117 07/31/17  1319  PROT 7.3 8.2* 7.7   HEMOGLOBIN A1C No components found for: HGA1C,  MPG CARDIAC ENZYMES Lab Results  Component Value Date   CKTOTAL 61 08/26/2011   CKMB 1.6 08/26/2011   TROPONINI <0.03 08/01/2017   TROPONINI <0.03 08/01/2017   TROPONINI <0.03 08/01/2017   BNP No results for input(s): PROBNP in the last 8760 hours. TSH No results for input(s): TSH in the last 8760 hours. CHOLESTEROL Recent Labs    08/01/17 0500  CHOL 199    Scheduled Meds: .  stroke: mapping our early stages of recovery book   Does not apply Once  . aspirin EC  81 mg Oral Daily  . atorvastatin  80 mg Oral q1800  . bumetanide  1 mg Oral BID  . buPROPion  100 mg Oral BID  . docusate sodium  100 mg Oral BID  . enoxaparin (LOVENOX) injection  40 mg Subcutaneous Q24H  . escitalopram  20 mg Oral QHS  .  famotidine  20 mg Oral BID  . fluticasone  1 spray Each Nare Q2200  . Lifitegrast  1 drop Both Eyes BID  . linaclotide  145 mcg Oral QAC breakfast  . loratadine  10 mg Oral Daily  . losartan  25 mg Oral Daily  . multivitamin with minerals  1 tablet Oral Daily  . spironolactone  100 mg Oral BID   Continuous Infusions: . sodium chloride 75 mL/hr at 08/01/17 0012   PRN Meds:.acetaminophen, fluticasone, hydrALAZINE, hydrocortisone cream, hydrOXYzine, meclizine, polyvinyl alcohol, senna-docusate, simethicone, sodium chloride, zolpidem  Assessment/Plan: Benign positional vertigo, improving Possible small brain stem stroke H/O brain aneurysms Obesity IBS Headache Mild AS and MR  F/U in 2 months. May decrease spironolactone by 50 % to continue Losartan with h/o low BP. Agree with aspirin use. Discussed OP cardiac interventions in future.   LOS: 0 days    Dixie Dials  MD  08/02/2017, 10:02 AM

## 2017-08-02 NOTE — Telephone Encounter (Signed)
FAX LIVER BIOPSY PATH REPORT TO DR. Cleatis Polka SAYED.  PT CURRENTLY IN HOSPITAL FOR DIZZINESS AND VERTIGO SINCE SAT PM. POSSIBLE BRAINSTEM STROKE. NEEDS HEART CATH IN 2 MOS. BOWELS HAVE BEEN OK SINCE STAYING IN HOSPITAL. ONE DECENT BM SINCE SAT.

## 2017-08-02 NOTE — Progress Notes (Signed)
08/02/17 1300  PT Visit Information  Last PT Received On 08/02/17  Assistance Needed +1  History of Present Illness is a 63 y.o. female with medical history significant of hypertension, GERD, depression, brain aneurysm with clip and coil, liver cirrhosis, hepatic encephalopathy, alcohol abuse in remission, IBS, Sjogren's disease, CKD 3, who presents with dizziness.  Subjective Data  Patient Stated Goal feel better  Precautions  Precautions Fall  Restrictions  Weight Bearing Restrictions No  Pain Assessment  Pain Assessment No/denies pain  Cognition  Arousal/Alertness Awake/alert  Behavior During Therapy WFL for tasks assessed/performed  Overall Cognitive Status Within Functional Limits for tasks assessed  Bed Mobility  Overal bed mobility Modified Independent  Transfers  Overall transfer level Needs assistance  Equipment used Rolling walker (2 wheeled)  Transfers Sit to/from Stand  Sit to Stand Min guard  General transfer comment Performed Epley maneuver for right anterior canal BPPV as pt was positive for BPPV.   Pt needed steadying assist provided.  Ambulation/Gait  Ambulation/Gait assistance Min guard  Ambulation Distance (Feet) 150 Feet  Assistive device Rolling walker (2 wheeled)  Gait Pattern/deviations Decreased stride length;Wide base of support;Drifts right/left  General Gait Details Very guarded with some head movements. Mild drifting to Lt and Rt but no significant ataxia appreciated. No buckling or loss of balance although slow.   Gait velocity interpretation 1.31 - 2.62 ft/sec, indicative of limited community ambulator  Modified Rankin (Stroke Patients Only)  Pre-Morbid Rankin Score 1  Modified Rankin 3  Balance  Overall balance assessment Needs assistance  Sitting-balance support Feet supported;No upper extremity supported  Sitting balance-Leahy Scale Normal  Standing balance support Bilateral upper extremity supported;During functional activity  Standing  balance-Leahy Scale Poor  Standing balance comment relies on UE support  High Level Balance Comments Vertical and horizontal head turns with gait, no loss of balance but does need RW.  Exercises  Exercises Other exercises  Other Exercises  Other Exercises Educated pt and husband regarding BPPV as well as Nestor Lewandowsky exercises with handouts given.   PT - End of Session  Equipment Utilized During Treatment Gait belt  Activity Tolerance Patient tolerated treatment well  Patient left with call bell/phone within reach;in bed;with family/visitor present  Nurse Communication Mobility status   PT - Assessment/Plan  PT Plan Current plan remains appropriate  PT Visit Diagnosis Unsteadiness on feet (R26.81);Dizziness and giddiness (R42)  PT Frequency (ACUTE ONLY) Min 4X/week  Follow Up Recommendations Outpatient PT (neuro/vestibular rehab)  PT equipment None recommended by PT  AM-PAC PT "6 Clicks" Daily Activity Outcome Measure  Difficulty turning over in bed (including adjusting bedclothes, sheets and blankets)? 4  Difficulty moving from lying on back to sitting on the side of the bed?  4  Difficulty sitting down on and standing up from a chair with arms (e.g., wheelchair, bedside commode, etc,.)? 3  Help needed moving to and from a bed to chair (including a wheelchair)? 4  Help needed walking in hospital room? 3  Help needed climbing 3-5 steps with a railing?  4  6 Click Score 22  Mobility G Code  CJ  PT Goal Progression  Progress towards PT goals Progressing toward goals  PT Time Calculation  PT Start Time (ACUTE ONLY) 1142  PT Stop Time (ACUTE ONLY) 1225  PT Time Calculation (min) (ACUTE ONLY) 43 min  PT G-Codes **NOT FOR INPATIENT CLASS**  Functional Assessment Tool Used AM-PAC 6 Clicks Basic Mobility;Clinical judgement  Functional Limitation Mobility: Walking and moving around  Mobility: Walking and Moving Around Current Status [phone]) CI  Mobility: Walking and Moving Around Goal  Status (W5462) CI  PT General Charges  $$ ACUTE PT VISIT 1 Visit  PT Treatments  $Gait Training 8-22 mins  $Therapeutic Exercise 8-22 mins  $Therapeutic Activity 8-22 mins  Pt was feeling less dizzy on arrival but still some residual dizziness.  Noted in am pt with right posterior canal BPPV but in pm itseemed to be right Anterior canal BPPV.  Treated with Epley with pt feeling better after treatment.  Pt aware to perform Nestor Lewandowsky exercise several x day and f/u at [facility] for vestibular rehab.  Will continue to follow pt while in hospital.  [facility] [phone] (pager)

## 2017-08-02 NOTE — Progress Notes (Signed)
Missing printed prescriptions for discharge, MD notified.

## 2017-08-02 NOTE — Progress Notes (Signed)
Patient awaiting D/C, patient has questions and MD asked. Patient assisted to restroom were she voided w/o difficulty. Tele-monitor D/C, IV catheter removed with catheter intact, medications and AVS reviewed, patientwill be assisted to car by staff

## 2017-08-02 NOTE — Progress Notes (Addendum)
Physical Therapy Treatment Patient Details Name: Johann Gascoigne MRN: [id number] DOB: 02/03/1956 Today's Date: 08/02/2017    History of Present Illness is a 62 y.o. female with medical history significant of hypertension, GERD, depression, brain aneurysm with clip and coil, liver cirrhosis, hepatic encephalopathy, alcohol abuse in remission, IBS, Sjogren's disease, CKD 3, who presents with dizziness.    PT Comments    Pt admitted with above diagnosis. Pt currently with functional limitations due to the deficits listed below (see PT Problem List). Pt was able to ambulate around bed with overall good steady gait after the Epley maneuver was performed.  Pt was positive for right posterior canal BPPV but suspect that crystals may be stuck between canals and will return at 11 am to reassess and treat further prn.  Will need f/u Outpt. PT.   Pt will benefit from skilled PT to increase their independence and safety with mobility to allow discharge to the venue listed below.     Follow Up Recommendations  Outpatient PT(neuro/vestibular rehab)     Equipment Recommendations  None recommended by PT    Recommendations for Other Services       Precautions / Restrictions Precautions Precautions: Fall Restrictions Weight Bearing Restrictions: No    Mobility  Bed Mobility Overal bed mobility: Modified Independent                Transfers Overall transfer level: Needs assistance   Transfers: Sit to/from Stand Sit to Stand: Min guard         General transfer comment: Performed Epley maneuver for right posterior canal BPPV as pt was positive for BPPV.  Pt was 4/10 dizzy after treatment.  Pt needed steadying assist provided.  Ambulation/Gait Ambulation/Gait assistance: Min guard Ambulation Distance (Feet): 20 Feet Assistive device: None Gait Pattern/deviations: Decreased stride length;Wide base of support;Drifts right/left   Gait velocity interpretation: 1.31 - 2.62 ft/sec,  indicative of limited community ambulator General Gait Details: Very guarded with minimal head movements. Mild drifting to Lt and Rt but no significant ataxia appreciated. No buckling or loss of balance although slow. Only walked around bed after Epley manuever as pt had breakfast in room.    Stairs             Wheelchair Mobility    Modified Rankin (Stroke Patients Only) Modified Rankin (Stroke Patients Only) Pre-Morbid Rankin Score: No significant disability Modified Rankin: Moderate disability     Balance Overall balance assessment: Needs assistance Sitting-balance support: Feet supported;No upper extremity supported Sitting balance-Leahy Scale: Normal     Standing balance support: No upper extremity supported Standing balance-Leahy Scale: Fair                              Cognition Arousal/Alertness: Awake/alert Behavior During Therapy: WFL for tasks assessed/performed Overall Cognitive Status: Within Functional Limits for tasks assessed                                        Exercises      General Comments        Pertinent Vitals/Pain Pain Assessment: No/denies pain    Home Living                      Prior Function            PT Goals (current goals can  now be found in the care plan section) Acute Rehab PT Goals Patient Stated Goal: feel better Progress towards PT goals: Progressing toward goals    Frequency    Min 4X/week      PT Plan Current plan remains appropriate    Co-evaluation              AM-PAC PT "6 Clicks" Daily Activity  Outcome Measure  Difficulty turning over in bed (including adjusting bedclothes, sheets and blankets)?: None Difficulty moving from lying on back to sitting on the side of the bed? : None Difficulty sitting down on and standing up from a chair with arms (e.g., wheelchair, bedside commode, etc,.)?: A Little Help needed moving to and from a bed to chair (including  a wheelchair)?: None Help needed walking in hospital room?: A Little Help needed climbing 3-5 steps with a railing? : None 6 Click Score: 22    End of Session Equipment Utilized During Treatment: Gait belt Activity Tolerance: Patient tolerated treatment well Patient left: with call bell/phone within reach;in chair Nurse Communication: Mobility status PT Visit Diagnosis: Unsteadiness on feet (R26.81);Dizziness and giddiness (R42)     Time: 5885-0277 PT Time Calculation (min) (ACUTE ONLY): 26 min  Charges:  $Gait Training: 8-22 mins $Canalith Rep Proc: 8-22 mins                    G Codes:       Rashema Seawright,PT Acute Rehabilitation [phone]   Denice Paradise 08/02/2017, 8:31 AM

## 2017-08-02 NOTE — Telephone Encounter (Signed)
SEE TC MAY 2.

## 2017-08-02 NOTE — Discharge Summary (Signed)
Physician Discharge Summary  [facility] YPP:[id number] DOB: [birth year] DOA: 07/31/2017  PCP: Mikey Kirschner, MD  Admit date: 07/31/2017 Discharge date: 08/02/2017  Admitted From: Home Disposition: Home   Recommendations for Outpatient Follow-up:  1. Follow up with PCP in 1-2 weeks. Started aspirin and lipitor 40mg  to reduce risk of stroke. Will need to monitor LFTs closely. 2. Monitor BP, added low dose losartan per cardiology due to mild LV dysfunction.  3. Follow up with cardiology, neurology.   Home Health: None; outpatient vestibular PT referral made Equipment/Devices: Rolling walker Discharge Condition: Stable CODE STATUS: Full Diet recommendation: Heart healthy  Brief/Interim Summary: Deanna Thompson is a 62 y.o. 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 CT head did not reveal a stroke, and MRI is not obtained due to metal clip. Stroke team feels there is likely an undetected brainstem stroke with central findings on history and physical exam. The patient's symptoms improved and she will be referred for outpatient vestibular PT. ECG demonstrated LBBB which was not seen on previous ECG 3 years prior. The patient had no chest pain and troponin was normal. Dr. Doylene Canard was consulted. Echo showed mildly diminished EF without regional wall motion abnormalities. Losartan was started, and no ischemic evaluation was performed. Aspirin and statin were started and the patient was discharged in stable condition.   Discharge Diagnoses:  Principal Problem:   Dizziness Active Problems:   Cirrhosis ([place])   Depression   Essential hypertension   GERD (gastroesophageal reflux disease)   Alcohol abuse   Brain aneurysm   CKD (chronic  kidney disease), stage III (HCC)   LBBB (left bundle branch block)   Nausea & vomiting   Stroke (cerebrum)  Dizziness:Neurology was consulted, suspect brainstem stroke, though this wasn't seen on repeat CT head either. MRI not possible due to aneurysm clipping.  - Echocardiogram without cardioembolic source.  - Will continue vestibular PT at home.  - Start ASA and statin. LDL 129, so will lower lipitor 80mg  to 40mg  given h/o cirrhosis. HbA1c 5.8%. - Can continue symptomatic management per home medications for h/o BPPV, though these would not be expected to help in the setting of central cause.  Alcoholic cirrhosis:Mental status normal. No signs of hepatic encephalopathy. -Restart home medications at discharge.  - INR has been normal, LFTs normal, ammonia not elevated.    Depression:Stable, no suicidal or homicidal ideations. -Continue home medications:Lexapro  Essential hypertension: - Restart home medications at discharge in addition to low dose losartan as directed by cardiology  Chronic systolic dysfunction: No current ischemic evaluation planned.  - Discuss with Dr. Doylene Canard, will start very low dose losartan and follow up with him in 1 month.   GERD: - Pepcid  Alcohol abuse: In recovery since 2013  Brain aneurysm:Unsecured left MCA aneurysm 3 mm. Followed by Dr. Estanislado Pandy. -f/u with Dr. Estanislado Pandy  CKD stage III: Stable.  Nausea & vomiting:seconday to possible brain stem stroke. Resolved.  newLBBB (left bundle branch block):No chest pain or SOB. Trop negative. Dr. Doylene Canard was consulted. - Started ASA, statin. Follow up with cardiology in 1 month.  Discharge Instructions Discharge Instructions    Ambulatory referral to Neurology   Complete by:  As directed    An appointment is requested in approximately: 8 weeks  Diet - low sodium heart healthy   Complete by:  As directed    Discharge instructions   Complete by:  As directed    You were  admitted for dizziness which is thought to be due to a brainstem stroke that is too small to see on a CT scan. Fortunately, your symptoms are improving and you are stable for discharge with the following recommendations:  - Continue taking medications as you were, and START taking lipitor 40mg  and aspirin 81mg . The benefits of reducing your future risk of stroke is felt to be worth the risk of these medications in light of your cirrhosis, but it will be very important to follow up with Dr. Wolfgang Phoenix in the next couple weeks. He will need to monitor your liver function tests.  -  The heart was pumping mildly less vigorously than normal, so low dose losartan was added by cardiology to help remodel the heart. You will need to follow up with Dr. Doylene Canard in about 1 month.  - Follow up with neurology as well. If you are not contacted by [facility] in the next week, call the office to schedule an appointment. Or seek medical attention right away if your symptoms return/worsen.  - You will receive a rolling walker and referral for physical therapy prior to discharge.   Increase activity slowly   Complete by:  As directed      Allergies as of 08/02/2017      Reactions   Omeprazole Shortness Of Breath   Other Shortness Of Breath, Rash   Red Meat Causes shortness of breath, rash and flu like symptoms.    Phenytoin Rash   Keflex [cephalexin] Other (See Comments)   Severe yeast infection   Phenergan [promethazine Hcl] Other (See Comments)   Not in right state of mind, HALLUCINATION      Medication List    STOP taking these medications   acetaminophen 325 MG tablet Commonly known as:  TYLENOL   acetaminophen 500 MG tablet Commonly known as:  TYLENOL     TAKE these medications   aspirin 81 MG EC tablet Take 1 tablet (81 mg total) by mouth daily. Start taking on:  08/03/2017   atorvastatin 40 MG tablet Commonly known as:  LIPITOR Take 1 tablet (40 mg total) by mouth daily.   bumetanide 1 MG  tablet Commonly known as:  BUMEX TAKE (1) TABLET BY MOUTH (2) TIMES DAILY. What changed:    how much to take  how to take this  when to take this  additional instructions   buPROPion 100 MG 12 hr tablet Commonly known as:  WELLBUTRIN SR Take 1 tablet (100 mg total) by mouth 2 (two) times daily.   docusate sodium 100 MG capsule Commonly known as:  COLACE Take 100 mg by mouth 2 (two) times daily.   escitalopram 20 MG tablet Commonly known as:  LEXAPRO TAKE (1) TABLET BY MOUTH AT BEDTIME. What changed:    how much to take  how to take this  when to take this  additional instructions   fluticasone 50 MCG/ACT nasal spray Commonly known as:  FLONASE Place 1 spray into both nostrils See admin instructions. Instill 1 spray into each nostril every morning, may also use later in the day as needed for sinus congestion   GAS-X PO Take 1 tablet by mouth 2 (two) times daily as needed (bloating/flatulence).   hydrocortisone cream 1 % Apply 1 application topically 2 (two) times daily as needed (eczema  in ears).   linaclotide 145 MCG Caps capsule Commonly known as:  LINZESS Take 1 capsule (145 mcg total) by mouth daily before breakfast.   loratadine 10 MG tablet Commonly known as:  CLARITIN Take 10 mg by mouth daily.   losartan 25 MG tablet Commonly known as:  COZAAR Take 1 tablet (25 mg total) by mouth daily. Start taking on:  08/03/2017   meclizine 25 MG tablet Commonly known as:  ANTIVERT Take 1 tablet (25 mg total) by mouth 3 (three) times daily as needed for dizziness.   multivitamin with minerals Tabs tablet Take 1 tablet by mouth daily.   ondansetron 4 MG tablet Commonly known as:  ZOFRAN Take 1 tablet (4 mg total) by mouth every 8 (eight) hours as needed for nausea or vomiting.   ranitidine 150 MG tablet Commonly known as:  ZANTAC TAKE (1) TABLET BY MOUTH TWICE DAILY.   REFRESH OP Place 1 drop into both eyes 3 (three) times daily as needed (dry eyes).    sodium chloride 0.65 % nasal spray Commonly known as:  OCEAN Place 1 spray into the nose 2 (two) times daily as needed for congestion.   spironolactone 100 MG tablet Commonly known as:  ALDACTONE TAKE (1) TABLET BY MOUTH TWICE DAILY.   XIIDRA 5 % Soln Generic drug:  Lifitegrast Place 1 drop into both eyes 2 (two) times daily.            Durable Medical Equipment  (From admission, onward)        Start     Ordered   08/02/17 0936  For home use only DME Walker rolling  Once    Question Answer Comment  Patient needs a walker to treat with the following condition Gait instability   Patient needs a walker to treat with the following condition Dizziness      08/02/17 0936     Follow-up Information    Schedule an appointment as soon as possible for a visit  with Mikey Kirschner, MD.   Specialty:  Family Medicine Why:  For recheck of your symptoms Contact information: [place] [address] [phone]        Dixie Dials, MD. Schedule an appointment as soon as possible for a visit in 1 month(s).   Specialty:  Cardiology Contact information: [place] [address] [phone]        [facility]. Call.   Specialty:  Neurology Why:  for follow up if you are not contacted in the next week Contact information: [place] [phone]         Allergies  Allergen Reactions  . Omeprazole Shortness Of Breath  . Other Shortness Of Breath and Rash    Red Meat Causes shortness of breath, rash and flu like symptoms.   . Phenytoin Rash  . Keflex [Cephalexin] Other (See Comments)    Severe yeast infection  . Phenergan [Promethazine Hcl] Other (See Comments)    Not in right state of mind, HALLUCINATION    Consultations:  Neurology  Cardiology  Procedures/Studies: Ct Angio Head W Or Wo Contrast  Result Date: 07/31/2017 CLINICAL DATA:  Initial  evaluation for acute dizziness. History of treated basilar artery aneurysm EXAM: CT ANGIOGRAPHY HEAD AND NECK TECHNIQUE: Multidetector CT imaging of the head and neck was performed using the standard protocol during bolus administration of intravenous contrast. Multiplanar CT image reconstructions and MIPs were obtained to evaluate  the vascular anatomy. Carotid stenosis measurements (when applicable) are obtained utilizing NASCET criteria, using the distal internal carotid diameter as the denominator. CONTRAST:  64mL ISOVUE-370 IOPAMIDOL (ISOVUE-370) INJECTION 76% COMPARISON:  Prior CT from earlier the same day. FINDINGS: CTA NECK FINDINGS Aortic arch: Visualized aortic arch of normal caliber with normal branch pattern. Atheromatous plaque within the visualize arch and about the origin of the great vessels without hemodynamically significant stenosis. Visualized subclavian arteries widely patent. Right carotid system: Right common carotid artery patent from its origin to the bifurcation without stenosis. Mixed plaque about the right bifurcation/proximal right ICA without hemodynamically significant stenosis. Right ICA patent distally to the skull base without stenosis, dissection, or occlusion. Left carotid system: Left common carotid artery patent from its origin to the bifurcation without stenosis. Mild scattered plaque about the left bifurcation without hemodynamically significant stenosis. Left ICA patent from the bifurcation to the skull base without stenosis, dissection, or occlusion. Vertebral arteries: Both of the vertebral arteries arise from the subclavian arteries. Right vertebral artery dominant. Mild plaque at the origin of the vertebral arteries with no more than mild narrowing. There is focal tortuosity with a short-segment severe stenosis involving the left vertebral artery at the level of the C2 transverse foramen (series 8, image 115). Vertebral arteries otherwise patent without hemodynamically  significant stenosis or occlusion. Skeleton: No acute osseous abnormality. No worrisome lytic or blastic osseous lesions. Mild-to-moderate degenerative spondylolysis present at C5-6 and C6-7. Other neck: No other acute soft tissue abnormality within the neck. No adenopathy. Salivary glands normal. Thyroid gland normal. Upper chest: Visualized upper chest within normal limits. Partially visualized lungs are clear. Mild centrilobular emphysema. Review of the MIP images confirms the above findings CTA HEAD FINDINGS Anterior circulation: Petrous segments widely patent bilaterally. Scattered atheromatous plaque within the cavernous/supraclinoid ICAs with mild moderate multifocal narrowing, worse on the right. ICA termini widely patent. Dominant right A1 segment, accounting for a slightly diminutive left ICA is compared to the right. Normal anterior communicating artery. Anterior cerebral arteries widely patent to their distal aspects. Median artery of corpus callosum noted. Right M1 segment patent without stenosis or occlusion. Normal right MCA bifurcation. Distal right MCA branches well perfused. Left M1 segment patent without stenosis. Approximate 3 mm aneurysm seen arising from the left MCA bifurcation (series 8, image 84). This projects laterally and superiorly. Left MCA branches well perfused distally. Posterior circulation: Vertebral arteries patent to the vertebrobasilar junction without stenosis. Right vertebral artery dominant. Posterior inferior cerebral arteries patent bilaterally. Basilar artery patent to its distal aspect. Streak artifact from coil pack from prior treated basilar apex aneurysm. Adjacent surgical clips present. No appreciable remnant or evidence for recanalization identified, although evaluation limited by streak artifact. Origin of the superior cerebral arteries not visualized, although appear to be patent distally. Similarly, proximal PCAs not well visualize, although also appear to be  widely patent distally and perfused to their distal aspects. Venous sinuses: Patent. Anatomic variants: None significant. Delayed phase: Probable 7 mm calcified meningioma at the anterior falx again noted. No other abnormal enhancement. Right frontotemporal encephalomalacia again noted. Small remote left cerebellar infarct. Review of the MIP images confirms the above findings IMPRESSION: 1. Negative CTA for large vessel occlusion. 2. Treated basilar apex aneurysm. No remnant or CTA evidence for recanalization identified. 3. 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 4. Single short-segment severe distal left V2 stenosis. Vertebral arteries otherwise widely patent. Right vertebral artery dominant. 5. Atherosclerotic change about the carotid bifurcations and within the carotid  siphons without high-grade stenosis. Electronically Signed   By: Jeannine Boga M.D.   On: 07/31/2017 21:03   Ct Head Wo Contrast  Result Date: 08/02/2017 CLINICAL DATA:  62 y/o  F; stroke for follow-up. EXAM: CT HEAD WITHOUT CONTRAST TECHNIQUE: Contiguous axial images were obtained from the base of the skull through the vertex without intravenous contrast. COMPARISON:  07/31/2017 CT head and CTA head. FINDINGS: Brain: No evidence of acute infarction, hemorrhage, hydrocephalus, extra-axial collection or mass lesion/mass effect. Stable right anterior temporal, right inferolateral frontal, and anterior insula encephalomalacia. Vascular: Basilar aneurysm clip again noted. Calcific atherosclerosis of vertebral arteries and carotid siphons. Skull: Chronic postsurgical changes from right pterional craniotomy. Sinuses/Orbits: No acute finding. Other: None. IMPRESSION: 1. No acute intracranial abnormality identified. 2. Stable chronic postsurgical changes related to right pterional craniotomy and subjacent encephalomalacia. Basilar aneurysm clip noted. Electronically Signed   By: Kristine Garbe M.D.   On: 08/02/2017 04:03   Ct  Head Wo Contrast  Result Date: 07/31/2017 CLINICAL DATA:  62 year old female with a history treated basilar artery aneurysm EXAM: CT HEAD WITHOUT CONTRAST TECHNIQUE: Contiguous axial images were obtained from the base of the skull through the vertex without intravenous contrast. COMPARISON:  12/25/2004, 11/16/2005, prior angiogram FINDINGS: Brain: No acute intracranial hemorrhage. No midline shift or mass effect. Encephalomalacia of the right frontal region and right temporal lobe again demonstrated. Unremarkable configuration of the ventricles. Streak artifact present secondary to coil mass and surgical clips at the treated basilar aneurysm. Gray-white differentiation maintained. Calcified lesion along the anterior falx overlying the left frontal lobe measuring 7 mm Vascular: Surgical changes of right pterional approach craniotomy Skull: No acute fracture Sinuses/Orbits: No acute finding. Other: None IMPRESSION: Head CT negative for acute intracranial abnormality. Redemonstration of treated basilar artery aneurysm and right pterional approach craniotomy with associated underlying encephalomalacia. Electronically Signed   By: Corrie Mckusick D.O.   On: 07/31/2017 14:43   Ct Angio Neck W And/or Wo Contrast  Result Date: 07/31/2017 CLINICAL DATA:  Initial evaluation for acute dizziness. History of treated basilar artery aneurysm EXAM: CT ANGIOGRAPHY HEAD AND NECK TECHNIQUE: Multidetector CT imaging of the head and neck was performed using the standard protocol during bolus administration of intravenous contrast. Multiplanar CT image reconstructions and MIPs were obtained to evaluate the vascular anatomy. Carotid stenosis measurements (when applicable) are obtained utilizing NASCET criteria, using the distal internal carotid diameter as the denominator. CONTRAST:  24mL ISOVUE-370 IOPAMIDOL (ISOVUE-370) INJECTION 76% COMPARISON:  Prior CT from earlier the same day. FINDINGS: CTA NECK FINDINGS Aortic arch: Visualized  aortic arch of normal caliber with normal branch pattern. Atheromatous plaque within the visualize arch and about the origin of the great vessels without hemodynamically significant stenosis. Visualized subclavian arteries widely patent. Right carotid system: Right common carotid artery patent from its origin to the bifurcation without stenosis. Mixed plaque about the right bifurcation/proximal right ICA without hemodynamically significant stenosis. Right ICA patent distally to the skull base without stenosis, dissection, or occlusion. Left carotid system: Left common carotid artery patent from its origin to the bifurcation without stenosis. Mild scattered plaque about the left bifurcation without hemodynamically significant stenosis. Left ICA patent from the bifurcation to the skull base without stenosis, dissection, or occlusion. Vertebral arteries: Both of the vertebral arteries arise from the subclavian arteries. Right vertebral artery dominant. Mild plaque at the origin of the vertebral arteries with no more than mild narrowing. There is focal tortuosity with a short-segment severe stenosis involving the left vertebral artery  at the level of the C2 transverse foramen (series 8, image 115). Vertebral arteries otherwise patent without hemodynamically significant stenosis or occlusion. Skeleton: No acute osseous abnormality. No worrisome lytic or blastic osseous lesions. Mild-to-moderate degenerative spondylolysis present at C5-6 and C6-7. Other neck: No other acute soft tissue abnormality within the neck. No adenopathy. Salivary glands normal. Thyroid gland normal. Upper chest: Visualized upper chest within normal limits. Partially visualized lungs are clear. Mild centrilobular emphysema. Review of the MIP images confirms the above findings CTA HEAD FINDINGS Anterior circulation: Petrous segments widely patent bilaterally. Scattered atheromatous plaque within the cavernous/supraclinoid ICAs with mild moderate  multifocal narrowing, worse on the right. ICA termini widely patent. Dominant right A1 segment, accounting for a slightly diminutive left ICA is compared to the right. Normal anterior communicating artery. Anterior cerebral arteries widely patent to their distal aspects. Median artery of corpus callosum noted. Right M1 segment patent without stenosis or occlusion. Normal right MCA bifurcation. Distal right MCA branches well perfused. Left M1 segment patent without stenosis. Approximate 3 mm aneurysm seen arising from the left MCA bifurcation (series 8, image 84). This projects laterally and superiorly. Left MCA branches well perfused distally. Posterior circulation: Vertebral arteries patent to the vertebrobasilar junction without stenosis. Right vertebral artery dominant. Posterior inferior cerebral arteries patent bilaterally. Basilar artery patent to its distal aspect. Streak artifact from coil pack from prior treated basilar apex aneurysm. Adjacent surgical clips present. No appreciable remnant or evidence for recanalization identified, although evaluation limited by streak artifact. Origin of the superior cerebral arteries not visualized, although appear to be patent distally. Similarly, proximal PCAs not well visualize, although also appear to be widely patent distally and perfused to their distal aspects. Venous sinuses: Patent. Anatomic variants: None significant. Delayed phase: Probable 7 mm calcified meningioma at the anterior falx again noted. No other abnormal enhancement. Right frontotemporal encephalomalacia again noted. Small remote left cerebellar infarct. Review of the MIP images confirms the above findings IMPRESSION: 1. Negative CTA for large vessel occlusion. 2. Treated basilar apex aneurysm. No remnant or CTA evidence for recanalization identified. 3. 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 4. Single short-segment severe distal left V2 stenosis. Vertebral arteries otherwise widely patent.  Right vertebral artery dominant. 5. Atherosclerotic change about the carotid bifurcations and within the carotid siphons without high-grade stenosis. Electronically Signed   By: Jeannine Boga M.D.   On: 07/31/2017 21:03   ECHOCARDIOGRAM  - Left ventricle: The cavity size was normal. There was mild   concentric hypertrophy. Systolic function was mildly reduced. The   estimated ejection fraction was in the range of 45% to 50%.   Diffuse hypokinesis. Doppler parameters are consistent with   abnormal left ventricular relaxation (grade 1 diastolic   dysfunction). - Aortic valve: Transvalvular velocity was increased, due to   stenosis. There was very mild stenosis. There was trivial   regurgitation. - Mitral valve: There was mild regurgitation. - Left atrium: The atrium was moderately to severely dilated. - Right atrium: The atrium was mildly dilated.  Subjective: Diplopia and dizziness are resolved. No chest pain or dyspnea.   Discharge Exam: Vitals:   08/02/17 0853 08/02/17 0906  BP: 113/65 100/64  Pulse: (!) 54 (!) 57  Resp:  15  Temp:    SpO2:  100%   General: Pt is alert, awake, not in acute distress Cardiovascular: RRR, S1/S2 +, no rubs, no gallops Respiratory: CTA bilaterally, no wheezing, no rhonchi Abdominal: Soft, NT, ND, bowel sounds + Extremities: No edema,  no cyanosis Neuro: Alert, oriented. No focal deficits.   Labs: BNP (last 3 results) No results for input(s): BNP in the last 8760 hours. Basic Metabolic Panel: Recent Labs  Lab 07/31/17 1319  NA 140  K 3.6  CL 103  CO2 20*  GLUCOSE 159*  BUN 20  CREATININE 1.26*  CALCIUM 9.5   Liver Function Tests: Recent Labs  Lab 07/31/17 1319  AST 22  ALT 17  ALKPHOS 65  BILITOT 1.1  PROT 7.7  ALBUMIN 4.2   Recent Labs  Lab 07/31/17 1319  LIPASE 37   Recent Labs  Lab 08/01/17 0213  AMMONIA 20   CBC: Recent Labs  Lab 07/31/17 1319  WBC 10.8*  HGB 13.8  HCT 41.0  MCV 91.7  PLT 207    Cardiac Enzymes: Recent Labs  Lab 08/01/17 0213 08/01/17 0516 08/01/17 0729  TROPONINI <0.03 <0.03 <0.03   BNP: Invalid input(s): POCBNP CBG: No results for input(s): GLUCAP in the last 168 hours. D-Dimer No results for input(s): DDIMER in the last 72 hours. Hgb A1c Recent Labs    08/01/17 0500  HGBA1C 5.8*   Lipid Profile Recent Labs    08/01/17 0500  CHOL 199  HDL 50  LDLCALC 129*  TRIG 102  CHOLHDL 4.0   Thyroid function studies No results for input(s): TSH, T4TOTAL, T3FREE, THYROIDAB in the last 72 hours.  Invalid input(s): FREET3 Anemia work up No results for input(s): VITAMINB12, FOLATE, FERRITIN, TIBC, IRON, RETICCTPCT in the last 72 hours. Urinalysis    Component Value Date/Time   COLORURINE YELLOW 08/01/2017 0025   APPEARANCEUR CLEAR 08/01/2017 0025   LABSPEC 1.039 (H) 08/01/2017 0025   PHURINE 6.0 08/01/2017 0025   GLUCOSEU NEGATIVE 08/01/2017 0025   HGBUR NEGATIVE 08/01/2017 0025   BILIRUBINUR NEGATIVE 08/01/2017 0025   KETONESUR 5 (A) 08/01/2017 0025   PROTEINUR NEGATIVE 08/01/2017 0025   UROBILINOGEN 0.2 06/08/2013 1943   NITRITE NEGATIVE 08/01/2017 0025   LEUKOCYTESUR NEGATIVE 08/01/2017 0025    Microbiology No results found for this or any previous visit (from the past 240 hour(s)).  Time coordinating discharge: Approximately 40 minutes  Patrecia Pour, MD  [facility] 08/02/2017, 10:14 AM Pager [phone]

## 2017-08-02 NOTE — Care Management Note (Signed)
Case Management Note  Patient Details  Name: Deanna Thompson MRN: [id number] Date of Birth: [birth year]  Subjective/Objective:   Presented with dizziness,  hx of hypertension, GERD, depression, brain aneurysm with clip and coil, liver cirrhosis, hepatic encephalopathy, alcohol abuse in remission, IBS, Sjogren's disease, CKD 3.              Gerilynn Mccullars (Spouse)     [id number]      PCP: Baltazar Apo   Action/Plan: Transition to home with outpatient vestibular PT to follow.  Expected Discharge Date:  08/02/17               Expected Discharge Plan:  Home/Self Care  In-House Referral:     Discharge planning Services     Post Acute Care Choice:    Choice offered to:     DME Arranged:  [place] DME Agency:  [place]:   N/A HH Agency:   N/A  Status of Service:  Completed, signed off  If discussed at [place] of Stay Meetings, dates discussed:    Additional Comments:  Sharin Mons, RN 08/02/2017, 3:40 PM

## 2017-08-02 NOTE — Progress Notes (Addendum)
Neurology Progress Note  Subjective: No family at bedside. No acute event overnight. Pt had CT repeat which showed no acute abnormalities. Cardiology on board for LBBB on EKG. EF 45-50% and plans for outpt follow up. Worked with PT and positive Engineer, water.   Medications  Current Facility-Administered Medications:  .   stroke: mapping our early stages of recovery book, , Does not apply, Once, Ivor Costa, MD .  0.9 %  sodium chloride infusion, , Intravenous, Continuous, Ivor Costa, MD, Last Rate: 75 mL/hr at 08/01/17 0012 .  acetaminophen (TYLENOL) tablet 650 mg, 650 mg, Oral, Q6H PRN, Ivor Costa, MD, 650 mg at 08/01/17 1600 .  aspirin EC tablet 81 mg, 81 mg, Oral, Daily, Rosalin Hawking, MD, 81 mg at 08/02/17 0855 .  atorvastatin (LIPITOR) tablet 80 mg, 80 mg, Oral, q1800, Ivor Costa, MD, 80 mg at 08/01/17 1753 .  bumetanide (BUMEX) tablet 1 mg, 1 mg, Oral, BID, Ivor Costa, MD, 1 mg at 08/02/17 0843 .  buPROPion [facility] SR) 12 hr tablet 100 mg, 100 mg, Oral, BID, Ivor Costa, MD, 100 mg at 08/02/17 0844 .  docusate sodium (COLACE) capsule 100 mg, 100 mg, Oral, BID, Ivor Costa, MD, 100 mg at 08/02/17 0844 .  enoxaparin (LOVENOX) injection 40 mg, 40 mg, Subcutaneous, Q24H, Ivor Costa, MD, 40 mg at 08/02/17 0855 .  escitalopram (LEXAPRO) tablet 20 mg, 20 mg, Oral, QHS, Ivor Costa, MD, 20 mg at 08/01/17 2156 .  famotidine (PEPCID) tablet 20 mg, 20 mg, Oral, BID, Ivor Costa, MD, 20 mg at 08/02/17 0845 .  fluticasone (FLONASE) 50 MCG/ACT nasal spray 1 spray, 1 spray, Each Nare, Q2200, Ivor Costa, MD, 1 spray at 08/01/17 2201 .  fluticasone (FLONASE) 50 MCG/ACT nasal spray 1 spray, 1 spray, Each Nare, Daily PRN, Cristal Deer, MD .  hydrALAZINE (APRESOLINE) injection 5 mg, 5 mg, Intravenous, Q2H PRN, Ivor Costa, MD .  hydrocortisone cream 1 % 1 application, 1 application, Topical, BID PRN, Ivor Costa, MD .  hydrOXYzine (ATARAX/VISTARIL) tablet 10 mg, 10 mg, Oral, TID PRN, Ivor Costa, MD .   Lifitegrast 5 % SOLN 1 drop, 1 drop, Both Eyes, BID, Ivor Costa, MD .  linaclotide (LINZESS) capsule 145 mcg, 145 mcg, Oral, QAC breakfast, Ivor Costa, MD, 145 mcg at 08/02/17 0847 .  loratadine (CLARITIN) tablet 10 mg, 10 mg, Oral, Daily, Ivor Costa, MD, 10 mg at 08/02/17 0848 .  losartan (COZAAR) tablet 25 mg, 25 mg, Oral, Daily, Dixie Dials, MD, 25 mg at 08/02/17 0852 .  meclizine (ANTIVERT) tablet 25 mg, 25 mg, Oral, TID PRN, Ivor Costa, MD .  multivitamin with minerals tablet 1 tablet, 1 tablet, Oral, Daily, Ivor Costa, MD, 1 tablet at 08/02/17 [phone] .  polyvinyl alcohol (LIQUIFILM TEARS) 1.4 % ophthalmic solution 1 drop, 1 drop, Both Eyes, TID PRN, Ivor Costa, MD .  senna-docusate (Senokot-S) tablet 1 tablet, 1 tablet, Oral, QHS PRN, Ivor Costa, MD .  simethicone [facility]) chewable tablet 80 mg, 80 mg, Oral, BID PRN, Ivor Costa, MD .  sodium chloride (OCEAN) 0.65 % nasal spray 1 spray, 1 spray, Nasal, BID PRN, Ivor Costa, MD .  spironolactone (ALDACTONE) tablet 100 mg, 100 mg, Oral, BID, Ivor Costa, MD, 100 mg at 08/02/17 0851 .  zolpidem (AMBIEN) tablet 5 mg, 5 mg, Oral, QHS PRN, Ivor Costa, MD  Exam: Current vital signs: BP 100/64 (BP Location: Right Arm)   Pulse (!) 57   Temp 97.7 F (36.5 C) (Oral)   Resp 15  Ht 5\' 8"  (1.727 m)   Wt 227 lb 15.3 oz (103.4 kg)   SpO2 100%   BMI 34.66 kg/m  Vital signs in last 24 hours: Temp:  [97.7 F (36.5 C)-98.4 F (36.9 C)] 97.7 F (36.5 C) (05/06 0410) Pulse Rate:  [53-103] 57 (05/06 0906) Resp:  [15-19] 15 (05/06 0906) BP: (96-113)/(53-85) 100/64 (05/06 0906) SpO2:  [96 %-100 %] 100 % (05/06 0906)  GENERAL: Awake, alert, appears to be in no distress HEENT: - Normocephalic and atraumatic, dry mm, no LN++, no Thyromegally LUNGS - Clear to auscultation bilaterally with no wheezes and no sob CV - S1S2 RRR, no m/r/g, equal pulses bilaterally. No edema ABDOMEN - Soft, nontender, nondistended with normoactive BS Ext: warm, well  perfused, intact peripheral pulses, _no edema  NEURO:  Mental Status: AA&Ox3 Appropriate in conversation Language: speech is non-dysarthric.  Naming, repetition, fluency, and comprehension intact. Cranial Nerves: PERLLA, extra ocular movement exam shows mildly disconjugate gaze, NO nystagmus at rest this am, but there is some mild beating horizontal nystagmus while looking to the right, less noted when looking to left. No rotatory nystagmus while looking up. Denies any diplopia or blurred vision at this time. visual fields full, no facial asymmetry, facial sensation intact, hearing intact, tongue/uvula/soft palate midline, normal sternocleidomastoid and trapezius muscle strength. No evidence of tongue atrophy or fibrillations Motor: 5/5 throughout Tone: is normal and bulk is normal Sensation- Intact to light touch bilaterally, no extinction Coordination: FTN intact bilaterally, no ataxia in BLE. Gait- I got pt up to walk ~58ft, she is mildly ataxic, double steps.  NIHSS 0  Labs I have reviewed labs in epic and the results pertinent to this consultation are:  CBC    Component Value Date/Time   WBC 10.8 (H) 07/31/2017 1319   RBC 4.47 07/31/2017 1319   HGB 13.8 07/31/2017 1319   HCT 41.0 07/31/2017 1319   HCT 41 07/30/2011 0743   PLT 207 07/31/2017 1319   MCV 91.7 07/31/2017 1319   MCV 104.3 07/30/2011 0743   MCH 30.9 07/31/2017 1319   MCHC 33.7 07/31/2017 1319   RDW 13.4 07/31/2017 1319   LYMPHSABS 1.2 06/29/2017 1117   MONOABS 0.5 06/29/2017 1117   EOSABS 0.2 06/29/2017 1117   BASOSABS 0.0 06/29/2017 1117    CMP     Component Value Date/Time   NA 140 07/31/2017 1319   NA 138 07/30/2011 0741   K 3.6 07/31/2017 1319   K 3.8 07/30/2011 0741   CL 103 07/31/2017 1319   CO2 20 (L) 07/31/2017 1319   GLUCOSE 159 (H) 07/31/2017 1319   BUN 20 07/31/2017 1319   BUN 4 07/30/2011 0741   CREATININE 1.26 (H) 07/31/2017 1319   CREATININE 1.31 (H) 01/19/2017 1550   CALCIUM 9.5  07/31/2017 1319   PROT 7.7 07/31/2017 1319   ALBUMIN 4.2 07/31/2017 1319   ALBUMIN 3.0 07/30/2011 0741   AST 22 07/31/2017 1319   AST 171 07/30/2011 0741   ALT 17 07/31/2017 1319   ALKPHOS 65 07/31/2017 1319   ALKPHOS 111 07/30/2011 0741   BILITOT 1.1 07/31/2017 1319   BILITOT 3.6 07/30/2011 0741   GFRNONAA 45 (L) 07/31/2017 1319   GFRNONAA 44 (L) 01/19/2017 1550   GFRAA 53 (L) 07/31/2017 1319   GFRAA 52 (L) 01/19/2017 1550   Imaging I have reviewed the images obtained:  Ct Angio Head W Or Wo Contrast  Result Date: 07/31/2017 CLINICAL DATA:  Initial evaluation for acute dizziness. History of treated basilar artery  aneurysm EXAM: CT ANGIOGRAPHY HEAD AND NECK TECHNIQUE: Multidetector CT imaging of the head and neck was performed using the standard protocol during bolus administration of intravenous contrast. Multiplanar CT image reconstructions and MIPs were obtained to evaluate the vascular anatomy. Carotid stenosis measurements (when applicable) are obtained utilizing NASCET criteria, using the distal internal carotid diameter as the denominator. CONTRAST:  57mL ISOVUE-370 IOPAMIDOL (ISOVUE-370) INJECTION 76% COMPARISON:  Prior CT from earlier the same day. FINDINGS: CTA NECK FINDINGS Aortic arch: Visualized aortic arch of normal caliber with normal branch pattern. Atheromatous plaque within the visualize arch and about the origin of the great vessels without hemodynamically significant stenosis. Visualized subclavian arteries widely patent. Right carotid system: Right common carotid artery patent from its origin to the bifurcation without stenosis. Mixed plaque about the right bifurcation/proximal right ICA without hemodynamically significant stenosis. Right ICA patent distally to the skull base without stenosis, dissection, or occlusion. Left carotid system: Left common carotid artery patent from its origin to the bifurcation without stenosis. Mild scattered plaque about the left bifurcation  without hemodynamically significant stenosis. Left ICA patent from the bifurcation to the skull base without stenosis, dissection, or occlusion. Vertebral arteries: Both of the vertebral arteries arise from the subclavian arteries. Right vertebral artery dominant. Mild plaque at the origin of the vertebral arteries with no more than mild narrowing. There is focal tortuosity with a short-segment severe stenosis involving the left vertebral artery at the level of the C2 transverse foramen (series 8, image 115). Vertebral arteries otherwise patent without hemodynamically significant stenosis or occlusion. Skeleton: No acute osseous abnormality. No worrisome lytic or blastic osseous lesions. Mild-to-moderate degenerative spondylolysis present at C5-6 and C6-7. Other neck: No other acute soft tissue abnormality within the neck. No adenopathy. Salivary glands normal. Thyroid gland normal. Upper chest: Visualized upper chest within normal limits. Partially visualized lungs are clear. Mild centrilobular emphysema. Review of the MIP images confirms the above findings CTA HEAD FINDINGS Anterior circulation: Petrous segments widely patent bilaterally. Scattered atheromatous plaque within the cavernous/supraclinoid ICAs with mild moderate multifocal narrowing, worse on the right. ICA termini widely patent. Dominant right A1 segment, accounting for a slightly diminutive left ICA is compared to the right. Normal anterior communicating artery. Anterior cerebral arteries widely patent to their distal aspects. Median artery of corpus callosum noted. Right M1 segment patent without stenosis or occlusion. Normal right MCA bifurcation. Distal right MCA branches well perfused. Left M1 segment patent without stenosis. Approximate 3 mm aneurysm seen arising from the left MCA bifurcation (series 8, image 84). This projects laterally and superiorly. Left MCA branches well perfused distally. Posterior circulation: Vertebral arteries patent  to the vertebrobasilar junction without stenosis. Right vertebral artery dominant. Posterior inferior cerebral arteries patent bilaterally. Basilar artery patent to its distal aspect. Streak artifact from coil pack from prior treated basilar apex aneurysm. Adjacent surgical clips present. No appreciable remnant or evidence for recanalization identified, although evaluation limited by streak artifact. Origin of the superior cerebral arteries not visualized, although appear to be patent distally. Similarly, proximal PCAs not well visualize, although also appear to be widely patent distally and perfused to their distal aspects. Venous sinuses: Patent. Anatomic variants: None significant. Delayed phase: Probable 7 mm calcified meningioma at the anterior falx again noted. No other abnormal enhancement. Right frontotemporal encephalomalacia again noted. Small remote left cerebellar infarct. Review of the MIP images confirms the above findings IMPRESSION: 1. Negative CTA for large vessel occlusion. 2. Treated basilar apex aneurysm. No remnant or CTA evidence for  recanalization identified. 3. 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 4. Single short-segment severe distal left V2 stenosis. Vertebral arteries otherwise widely patent. Right vertebral artery dominant. 5. Atherosclerotic change about the carotid bifurcations and within the carotid siphons without high-grade stenosis. Electronically Signed   By: Jeannine Boga M.D.   On: 07/31/2017 21:03   Ct Head Wo Contrast  Result Date: 08/02/2017 CLINICAL DATA:  61 y/o  F; stroke for follow-up. EXAM: CT HEAD WITHOUT CONTRAST TECHNIQUE: Contiguous axial images were obtained from the base of the skull through the vertex without intravenous contrast. COMPARISON:  07/31/2017 CT head and CTA head. FINDINGS: Brain: No evidence of acute infarction, hemorrhage, hydrocephalus, extra-axial collection or mass lesion/mass effect. Stable right anterior temporal, right  inferolateral frontal, and anterior insula encephalomalacia. Vascular: Basilar aneurysm clip again noted. Calcific atherosclerosis of vertebral arteries and carotid siphons. Skull: Chronic postsurgical changes from right pterional craniotomy. Sinuses/Orbits: No acute finding. Other: None. IMPRESSION: 1. No acute intracranial abnormality identified. 2. Stable chronic postsurgical changes related to right pterional craniotomy and subjacent encephalomalacia. Basilar aneurysm clip noted. Electronically Signed   By: Kristine Garbe M.D.   On: 08/02/2017 04:03   Ct Head Wo Contrast  Result Date: 07/31/2017 CLINICAL DATA:  62 year old female with a history treated basilar artery aneurysm EXAM: CT HEAD WITHOUT CONTRAST TECHNIQUE: Contiguous axial images were obtained from the base of the skull through the vertex without intravenous contrast. COMPARISON:  12/25/2004, 11/16/2005, prior angiogram FINDINGS: Brain: No acute intracranial hemorrhage. No midline shift or mass effect. Encephalomalacia of the right frontal region and right temporal lobe again demonstrated. Unremarkable configuration of the ventricles. Streak artifact present secondary to coil mass and surgical clips at the treated basilar aneurysm. Gray-white differentiation maintained. Calcified lesion along the anterior falx overlying the left frontal lobe measuring 7 mm Vascular: Surgical changes of right pterional approach craniotomy Skull: No acute fracture Sinuses/Orbits: No acute finding. Other: None IMPRESSION: Head CT negative for acute intracranial abnormality. Redemonstration of treated basilar artery aneurysm and right pterional approach craniotomy with associated underlying encephalomalacia. Electronically Signed   By: Corrie Mckusick D.O.   On: 07/31/2017 14:43   Ct Angio Neck W And/or Wo Contrast  Result Date: 07/31/2017 CLINICAL DATA:  Initial evaluation for acute dizziness. History of treated basilar artery aneurysm EXAM: CT ANGIOGRAPHY  HEAD AND NECK TECHNIQUE: Multidetector CT imaging of the head and neck was performed using the standard protocol during bolus administration of intravenous contrast. Multiplanar CT image reconstructions and MIPs were obtained to evaluate the vascular anatomy. Carotid stenosis measurements (when applicable) are obtained utilizing NASCET criteria, using the distal internal carotid diameter as the denominator. CONTRAST:  73mL ISOVUE-370 IOPAMIDOL (ISOVUE-370) INJECTION 76% COMPARISON:  Prior CT from earlier the same day. FINDINGS: CTA NECK FINDINGS Aortic arch: Visualized aortic arch of normal caliber with normal branch pattern. Atheromatous plaque within the visualize arch and about the origin of the great vessels without hemodynamically significant stenosis. Visualized subclavian arteries widely patent. Right carotid system: Right common carotid artery patent from its origin to the bifurcation without stenosis. Mixed plaque about the right bifurcation/proximal right ICA without hemodynamically significant stenosis. Right ICA patent distally to the skull base without stenosis, dissection, or occlusion. Left carotid system: Left common carotid artery patent from its origin to the bifurcation without stenosis. Mild scattered plaque about the left bifurcation without hemodynamically significant stenosis. Left ICA patent from the bifurcation to the skull base without stenosis, dissection, or occlusion. Vertebral arteries: Both of the vertebral arteries  arise from the subclavian arteries. Right vertebral artery dominant. Mild plaque at the origin of the vertebral arteries with no more than mild narrowing. There is focal tortuosity with a short-segment severe stenosis involving the left vertebral artery at the level of the C2 transverse foramen (series 8, image 115). Vertebral arteries otherwise patent without hemodynamically significant stenosis or occlusion. Skeleton: No acute osseous abnormality. No worrisome lytic or  blastic osseous lesions. Mild-to-moderate degenerative spondylolysis present at C5-6 and C6-7. Other neck: No other acute soft tissue abnormality within the neck. No adenopathy. Salivary glands normal. Thyroid gland normal. Upper chest: Visualized upper chest within normal limits. Partially visualized lungs are clear. Mild centrilobular emphysema. Review of the MIP images confirms the above findings CTA HEAD FINDINGS Anterior circulation: Petrous segments widely patent bilaterally. Scattered atheromatous plaque within the cavernous/supraclinoid ICAs with mild moderate multifocal narrowing, worse on the right. ICA termini widely patent. Dominant right A1 segment, accounting for a slightly diminutive left ICA is compared to the right. Normal anterior communicating artery. Anterior cerebral arteries widely patent to their distal aspects. Median artery of corpus callosum noted. Right M1 segment patent without stenosis or occlusion. Normal right MCA bifurcation. Distal right MCA branches well perfused. Left M1 segment patent without stenosis. Approximate 3 mm aneurysm seen arising from the left MCA bifurcation (series 8, image 84). This projects laterally and superiorly. Left MCA branches well perfused distally. Posterior circulation: Vertebral arteries patent to the vertebrobasilar junction without stenosis. Right vertebral artery dominant. Posterior inferior cerebral arteries patent bilaterally. Basilar artery patent to its distal aspect. Streak artifact from coil pack from prior treated basilar apex aneurysm. Adjacent surgical clips present. No appreciable remnant or evidence for recanalization identified, although evaluation limited by streak artifact. Origin of the superior cerebral arteries not visualized, although appear to be patent distally. Similarly, proximal PCAs not well visualize, although also appear to be widely patent distally and perfused to their distal aspects. Venous sinuses: Patent. Anatomic  variants: None significant. Delayed phase: Probable 7 mm calcified meningioma at the anterior falx again noted. No other abnormal enhancement. Right frontotemporal encephalomalacia again noted. Small remote left cerebellar infarct. Review of the MIP images confirms the above findings IMPRESSION: 1. Negative CTA for large vessel occlusion. 2. Treated basilar apex aneurysm. No remnant or CTA evidence for recanalization identified. 3. 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 4. Single short-segment severe distal left V2 stenosis. Vertebral arteries otherwise widely patent. Right vertebral artery dominant. 5. Atherosclerotic change about the carotid bifurcations and within the carotid siphons without high-grade stenosis. Electronically Signed   By: Jeannine Boga M.D.   On: 07/31/2017 21:03   TTE - Left ventricle: The cavity size was normal. There was mild   concentric hypertrophy. Systolic function was mildly reduced. The   estimated ejection fraction was in the range of 45% to 50%.   Diffuse hypokinesis. Doppler parameters are consistent with   abnormal left ventricular relaxation (grade 1 diastolic   dysfunction). - Aortic valve: Transvalvular velocity was increased, due to   stenosis. There was very mild stenosis. There was trivial   regurgitation. - Mitral valve: There was mild regurgitation. - Left atrium: The atrium was moderately to severely dilated. - Right atrium: The atrium was mildly dilated.  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  Patient had a history of BPPV, which was short/brief lasting.  However at this time, patient has gradual onset of dizziness and then became  vertigo, nausea vomiting, diaphoresis as well as binocular diplopia, lasting more than 4 hours and then gradually getting better.  Concerning for central process. Currently at her baseline.  She stated that she had stomach viral infection 1 week ago, but very mild. Interestingly, pt worked with PT and still positive on Dix-Hallpike, vertigo lasting 30 sec, she will need oupt PT for BPPV maneuver.  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 Estanislado Pandy for cerebral angiogram was about 10 years ago when she did experienced brief aphasia post procedure.  Her examination benign.  CTA head neck again showed left MCA 3 mm aneurysm at bifurcation, left nondominant VA V2 stenosis.  2D echo EF 45-50%, will have outpt cardiology follow up.  A1c 5.8 and LDL 129, UDS neg.  Repeat CT no acute finding.  Put on aspirin and Lipitor, continue on discharge.  I had a long discussion with patient yesterday and today regarding her current event.  With vertigo, diplopia, ataxia, and is still more likely small brainstem central event.  BPPV not associate with diplopia or ataxia in the not lasting this long.  Vestibular neuritis also not having diplopia but normally lasting longer than this.  I am not sure whether this is related to her basilar artery pathology on top of previous coiling and clipping. DSA with Dr. Estanislado Pandy is an option but not necessarily change management this time.  After long discussion, given her clinical symptoms much improved and her previous experience of side effect of DSA, will hold off cerebral angiogram this time.  Neurology will sign off. Please call with questions. Pt will follow up with Dr. Leonie Man at [facility] in about 4-6 weeks. Thanks for the consult.  Rosalin Hawking, MD PhD Stroke Neurology 08/02/2017 11:28 AM

## 2017-08-03 NOTE — Telephone Encounter (Signed)
To Susan.

## 2017-08-04 NOTE — Telephone Encounter (Signed)
FAXED TO DR Sanjuana Letters [phone])

## 2017-08-04 NOTE — Progress Notes (Signed)
Mrs Deanna Thompson called the office asking about why tylenol was discontinued and if she should decrease spironolactone dose?  I reviewed the chart and advice to continue taking tylenol but not to take more than 2 grams per day. Per cardiology recommendations advice to reduced dose of aldactone to 50 mg po bid.

## 2017-08-09 ENCOUNTER — Telehealth: Payer: Self-pay

## 2017-08-09 NOTE — Telephone Encounter (Signed)
Pt came by the office with several concerns.  1. She takes 2 Colace daily and one Linzess 145 mcg daily.      She has a BM in the Am that is normal, then she might strain for a little and just watery diarrhea come out.      This only happens once a day and she would like to know what she needs to do.   2. She was recently started on Lipitor 40 mg daily and is concerned about her liver.      She just wants to make sure that Dr. Oneida Alar keeps check on her liver enzymes while she is taking Lipitor.  3. She received a letter from [facility] that says, starting July 1, Linzess will be added to a Step Therapy with a quantity limit, of one per day. The letter states that Step 1 therapy are lactulose and polyethylene glycol. She said the Polyethylene glycol does not work.   She said she is not in a rush for an answer and is aware that Dr. Oneida Alar is off today.

## 2017-08-10 ENCOUNTER — Ambulatory Visit: Payer: Commercial Managed Care - PPO | Attending: Dermatology | Admitting: Physical Therapy

## 2017-08-10 ENCOUNTER — Encounter: Payer: Self-pay | Admitting: Physical Therapy

## 2017-08-10 ENCOUNTER — Other Ambulatory Visit: Payer: Self-pay

## 2017-08-10 DIAGNOSIS — R42 Dizziness and giddiness: Secondary | ICD-10-CM | POA: Insufficient documentation

## 2017-08-10 DIAGNOSIS — R29818 Other symptoms and signs involving the nervous system: Secondary | ICD-10-CM | POA: Insufficient documentation

## 2017-08-10 DIAGNOSIS — H8113 Benign paroxysmal vertigo, bilateral: Secondary | ICD-10-CM | POA: Diagnosis not present

## 2017-08-10 DIAGNOSIS — R262 Difficulty in walking, not elsewhere classified: Secondary | ICD-10-CM | POA: Insufficient documentation

## 2017-08-10 NOTE — Therapy (Signed)
[facility] [address] [place], [place], [address] Phone: [phone]   Fax:  [phone]  Physical Therapy Evaluation  Patient Details  Name: Deanna Thompson MRN: [id number] Date of Birth: Nov 20, 1955 Referring Provider: Vance Gather [facility]); Rosalin Hawking [facility] CVA team); Antony Contras ([facility] Stroke MD)   Encounter Date: 08/10/2017  PT End of Session - 08/10/17 2042    Visit Number  1    Number of Visits  13    Date for PT Re-Evaluation  09/21/17    Authorization Type  UHC, UMR    Authorization Time Period  NA    Authorization - Visit Number  1    Authorization - Number of Visits  30 pt, ot, slp    PT Start Time  1400    PT Stop Time  1531    PT Time Calculation (min)  91 min    Equipment Utilized During Treatment  Gait belt    Activity Tolerance  Treatment limited secondary to medical complications (Comment) nausea, vertigo, imbalance, panic attack (per pt)    Behavior During Therapy  Anxious;Restless       Past Medical History:  Diagnosis Date  . Abdominal hernia   . Abscess of pulp of tooth 12/12/14   left side  . Anxiety   . Arthritis    back  and down flank  right side more than left  . B12 deficiency 06/12/2015  . Brain aneurysm    METAL COIL PREVENTS MRI IMAGING and CLIP  . Cirrhosis ([place])    Presumably alcoholic cirrhosis  . Complication of anesthesia   . Depression   . ETOH abuse    Quit in 07/2011   . Fatigue    due to Sjogren's disease  . Folate deficiency 06/30/2016  . GERD (gastroesophageal reflux disease)   . HTN (hypertension)    no longer medicated, dated on 04/25/12  . IBS (irritable bowel syndrome)   . Iron deficiency anemia due to chronic blood loss   . Myofascial pain   . Panic attack   . PONV (postoperative nausea and vomiting)   . Psoriatic arthritis ([place])   . Right inguinal hernia 08/16/2012  . Seizures ([place]) 1997   1 seizure from coil insertion but no more seizures and  no meds for seizures  . Sjogren's disease ([place])   . [place] HEAD 02/03/2007   Qualifier: Diagnosis of  By: Aline Brochure MD, Dorothyann Peng    . 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([place])     Past Surgical History:  Procedure Laterality Date  . AGILE CAPSULE N/A 06/21/2014   Procedure: AGILE CAPSULE;  Surgeon: Danie Binder, MD;  [facility];  Service: Endoscopy;  Laterality: N/A;  0700  . BIOPSY  10/13/2011   SLF: mild gastritis/duodenal polypoid lesion   . CEREBRAL ANEURYSM REPAIR  1997  . COLONOSCOPY  10/13/2011   PIR:JJOACZYS hemorrhoids/varices rectal/lesion in ascending colon(HYPERPLASTIC POLYP)  . COLONOSCOPY N/A 01/17/2013   SLF: 1. Internal hemorrhoids 2. Ascending colon polyps 3.  Rectal varices.   . COLONOSCOPY N/A 08/10/2016   Dr. Oneida Alar: redundant left colon. external and internal hemorhroids. 5-10 year surveillance  . ESOPHAGOGASTRODUODENOSCOPY N/A 06/29/2014   SLF: 1. anemia due to polypoid lesions in teh antrum, and duodenal polyps 2. single duodenum AVM  . FLEXIBLE SIGMOIDOSCOPY N/A 01/21/2016   Procedure: FLEXIBLE SIGMOIDOSCOPY;  Surgeon: Danie Binder, MD;  [facility];  Service: Endoscopy;  Laterality: N/A;  245  . GIVENS CAPSULE STUDY  N/A 06/29/2014   Procedure: GIVENS CAPSULE STUDY;  Surgeon: Danie Binder, MD;  [facility];  Service: Endoscopy;  Laterality: N/A;  . GIVENS CAPSULE STUDY N/A 07/13/2014   Procedure: GIVENS CAPSULE STUDY;  Surgeon: Danie Binder, MD;  [facility];  Service: Endoscopy;  Laterality: N/A;  700  . HEMORRHOID BANDING N/A 01/21/2016   Procedure: HEMORRHOID BANDING;  Surgeon: Danie Binder, MD;  [facility];  Service: Endoscopy;  Laterality: N/A;  . INGUINAL HERNIA REPAIR Right 08/28/2013   Procedure: RIGHT NGUINAL HERNIORRHAPHY WITH MESH;  Surgeon: Jamesetta So, MD;  Location: [facility];  Service: General;  Laterality: Right;  . INSERTION OF MESH N/A 06/09/2013   Procedure:  INSERTION OF MESH;  Surgeon: Jamesetta So, MD;  Location: [facility];  Service: General;  Laterality: N/A;  . INSERTION OF MESH Right 08/28/2013   Procedure: INSERTION OF MESH;  Surgeon: Jamesetta So, MD;  Location: [facility];  Service: General;  Laterality: Right;  . PARACENTESIS  Feb 2014   10.7 liters  . TOOTH EXTRACTION Left 12/12/14  . UMBILICAL HERNIA REPAIR N/A 06/09/2013   Procedure: UMBILICAL HERNIORRHAPHY WITH MESH;  Surgeon: Jamesetta So, MD;  Location: [facility];  Service: General;  Laterality: N/A;    There were no vitals filed for this visit.   Subjective Assessment - 08/10/17 1418    Subjective  I was very sick for at least 3 days when I went home. Used a RW. I got my new progressive lenses a few days after I got home. I've been very fatigued and sleeping a lot more. I was having trouble with my hand-writing. I just stopped using the walker yesterday.     Patient is accompained by:  Family member    Pertinent History  HTN, ETOH abuse, cirrhosis, brain aneurysm, Sjogren's disease, basilar aneurysm clip and coiling, Lt MCA aneurysm    Diagnostic tests  CT scans x 2 (cannot do MRI becasue of the clips & coils);     Patient Stated Goals  stop having dizziness and get back to walking without LOB    Currently in Pain?  Yes arthritis    Pain Score  -- unable to rate--"it's always there"    Pain Location  Ankle    Pain Orientation  Left    Pain Descriptors / Indicators  Aching    Pain Type  Chronic pain    Pain Onset  More than a month ago    Pain Frequency  Constant    Aggravating Factors   standing         [facility] PT Assessment - 08/10/17 1727      Assessment   Medical Diagnosis  Brainstem CVA; BPPV    Referring Provider  Vance Gather (Hospitalist); Rosalin Hawking [facility] CVA team); Antony Contras ([facility] Stroke MD)    Onset Date/Surgical Date  07/31/17    Prior Therapy  for BPPV and after basilar aneurysm clipping      Precautions   Precautions  Fall      Restrictions   Weight  Bearing Restrictions  No      Balance Screen   Has the patient fallen in the past 6 months  No    Has the patient had a decrease in activity level because of a fear of falling?   Yes    Is the patient reluctant to leave their home because of a fear of falling?   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      Observation/Other Assessments   Observations  walks from lobby to exam room without evidence of imbalance    Focus on Therapeutic Outcomes (FOTO)   FS 40, risk adjusted 52      Ambulation/Gait   Gait Comments  by end of session, required RW to walk out of clinic to her car due to imbalance, dizziness           Vestibular Assessment - 08/10/17 1424      Vestibular Assessment   General Observation  Head feels really full; if I turn my head I still get spins; listing to side;       Symptom Behavior   Type of Dizziness  "World moves"    Frequency of Dizziness  daily    Duration of Dizziness  seconds    Aggravating Factors  Forward bending    Relieving Factors  Head stationary      Occulomotor Exam   Occulomotor Alignment  Normal    Spontaneous  Absent    Gaze-induced  Absent    Smooth Pursuits  Intact    Comment  convergence ~18 cm, but eyes move symmetrically      Vestibulo-Occular Reflex   VOR 1 Head Only (x 1 viewing)  reports double, blurry vision when turning head to left (eyes deviated to the right)      Positional Testing   Dix-Hallpike  Dix-Hallpike Right;Dix-Hallpike Left      Dix-Hallpike Right   Dix-Hallpike Right Duration  0    Dix-Hallpike Right Symptoms  No nystagmus      Dix-Hallpike Left    Dix-Hallpike Left Duration  75 first 5 seconds left rotary,upbeating then became pure upbea    Dix-Hallpike Left Symptoms  Upbeat, left rotatory nystagmus;Upbeat Nystagmus          Objective measurements completed on examination: See above findings.              PT Education - 08/10/17 2040    Education provided  Yes    Education Details  Educated on symptoms consistent with CVA vs symptoms consistent with BPPV; 3 systems which integrate to maintain balance and how to appropriately challenge systems for improvement    Person(s) Educated  Patient;Spouse    Methods  Explanation    Comprehension  Verbalized understanding       PT Short Term Goals - 08/10/17 2116      PT SHORT TERM GOAL #1   Title  Patient will have negative positional testing for BPPV. (Target all STGs 08/31/2017)    Time  3    Period  Weeks    Status  New    Target Date  08/31/17      PT SHORT TERM GOAL #2   Title  Patient will complete balance assessments (DGI vs FGA vs Berg) and set LTGs as appropriate.     Time  3    Period  Weeks    Status  New      PT SHORT TERM GOAL #3   Title  Patient will  be independent with basic HEP to address vestibular deficits.     Time  3    Period  Weeks    Status  New        PT Long Term Goals - 08/10/17 2119      PT LONG TERM GOAL #1   Title  Patient will be independent with updated HEP and able to verbalize her plan for continued community-based activity upon d/c from PT. (Target for all LTGs 09/21/2017)    Time  6    Period  Weeks    Status  New    Target Date  09/21/17      PT LONG TERM GOAL #2   Title  Patient will ambulate modified independently (incr time) over level indoor and outdoor surfaces up to 400 ft with no assistive device and utilizing compensatory strategies as indicated.     Time  6    Period  Weeks    Status  New      PT LONG TERM GOAL #3   Title  Patient will be able to verbalize warning signs of CVA (including posterior circulation  CVAs) and appropriate steps to take if CVA suspected.     Time  6    Period  Weeks    Status  New      PT LONG TERM GOAL #4   Title  FOTO functional status score will improve to >=65/100 indicating improved overall functional abilities.     Time  6    Period  Weeks    Status  New             Plan - 08/10/17 2048    Clinical Impression Statement  Patient presents for OPPT s/p hospital admission for severe vertigo with pt ultimately diagnosed with brainstem CVA and BPPV both contributing to her vertigo. Her diagnosis was complicated by inability to have an MRI due to h/o aneurysm clipping/coiling. She presented today with description of symptoms that were consistent with both central and peripheral vestibular deficits. She had no symptoms when assessing for rt posterior canal BPPV, however severe vertigo and nystagmus when assessing for rt anterior canal/left posterior canal BPPV (see assessment for further details). Completed left Epley (for left posterior BPPV) with patient experiencing continued symptoms with head turns lasting 2-60 seconds. When "shifting vision" would resolve, she continued to feel nauseated, diaphoretic, and reported h/o panic attacks with feeling she was having a panic attack. She would respond to focusing on "A" to stabilize her vision and vc for slow, deep breathing for 1-2 minutes at a time and then would turn her head left and right "to check how I'm doing" and trigger increased symptoms and having to again work through settling her symptoms with max instructional cues. Patient required increased time to progress to standing and walking and required RW to safely ambulate out of clinic. Full balance and gait assessments to be completed on next visit. Patient will benefit from PT interventions (as listed below) to address the deficits listed below.      History and Personal Factors relevant to plan of care:  PMH-HTN, ETOH abuse, cirrhosis, brain aneurysm, Sjogren's  disease, basilar aneurysm clip and coiling, Lt MCA aneurysm;  Personal factors- coping style/emotional & behavioral responses; decr cognitive status (STM from prior brain surgery), slower expected progression due to both peripheral and central vestibular diagnoses    Clinical Presentation  Evolving    Clinical Presentation due to:  Continues with severe vertigo  Clinical Decision Making  Moderate    Rehab Potential  Good    Clinical Impairments Affecting Rehab Potential  anxiety, mixed peripheral and central vestibular deficits    PT Frequency  2x / week    PT Duration  6 weeks    PT Treatment/Interventions  ADLs/Self Care Home Management;Canalith Repostioning;Gait training;DME Instruction;Stair training;Functional mobility training;Therapeutic activities;Therapeutic exercise;Balance training;Neuromuscular re-education;Cognitive remediation;Patient/family education;Passive range of motion;Vestibular;Visual/perceptual remediation/compensation    PT Next Visit Plan  begin by assessing gait velocity, FGA vs DGI, ?Berg; assess Brandt-Daroff technique; ? head turns vs bending forward habituation    Consulted and Agree with Plan of Care  Patient;Family member/caregiver    Family Member Consulted  husband       Patient will benefit from skilled therapeutic intervention in order to improve the following deficits and impairments:  Abnormal gait, Decreased activity tolerance, Decreased balance, Decreased cognition, Decreased mobility, Decreased knowledge of use of DME, Dizziness, Impaired vision/preception  Visit Diagnosis: BPPV (benign paroxysmal positional vertigo), bilateral - Plan: PT plan of care cert/re-cert  Difficulty in walking, not elsewhere classified - Plan: PT plan of care cert/re-cert  Dizziness and giddiness - Plan: PT plan of care cert/re-cert  Other symptoms and signs involving the nervous system - Plan: PT plan of care cert/re-cert     Problem List Patient Active Problem List    Diagnosis Date Noted  . Benign paroxysmal positional vertigo   . 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 Essential hypertension 07/31/2017  . GERD (gastroesophageal reflux disease) 07/31/2017  . Alcohol abuse 07/31/2017  . Brain aneurysm 07/31/2017  . CKD (chronic kidney disease), stage III ([place]) 07/31/2017  . LBBB (left bundle branch block) 07/31/2017  . Dizziness 07/31/2017  . Nausea & vomiting 07/31/2017  . Folate deficiency 06/30/2016  . Constipation 04/01/2016  . 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 Hemorrhoids, internal, with bleeding 01/08/2016  . B12 deficiency 06/12/2015  . Psoriatic arthritis ([place]) 04/15/2015  . Nutritional anemia 09/19/2014  . Iron deficiency anemia due to chronic blood loss   . Insomnia 03/17/2014  . Depression 02/18/2014  . Labial swelling 08/23/2012  . Rash and nonspecific skin eruption 06/30/2012  . Colon polyps 11/18/2011  . Cirrhosis ([place]) 08/19/2011    Rexanne Mano, PT 08/10/2017, 9:31 PM  [facility] [address] [place], [place], [address] Phone: [phone]   Fax:  [phone]  Name: Lillyauna Jenkinson MRN: [id number] Date of Birth: 02/01/1956

## 2017-08-11 ENCOUNTER — Ambulatory Visit: Payer: Commercial Managed Care - PPO | Admitting: Physical Therapy

## 2017-08-11 ENCOUNTER — Encounter: Payer: Self-pay | Admitting: Physical Therapy

## 2017-08-11 DIAGNOSIS — H8113 Benign paroxysmal vertigo, bilateral: Secondary | ICD-10-CM

## 2017-08-11 DIAGNOSIS — R42 Dizziness and giddiness: Secondary | ICD-10-CM

## 2017-08-11 DIAGNOSIS — R262 Difficulty in walking, not elsewhere classified: Secondary | ICD-10-CM

## 2017-08-11 NOTE — Patient Instructions (Signed)
   Sleep with your head elevated 30 degrees.

## 2017-08-11 NOTE — Therapy (Signed)
[facility] [address] [place], [place], [address] Phone: [phone]   Fax:  [phone]  Physical Therapy Treatment  Patient Details  Name: Deanna Thompson MRN: [id number] Date of Birth: [birth year] Referring Provider: Vance Gather [facility]); Rosalin Hawking [facility] CVA team); Antony Contras ([facility] MD)   Encounter Date: 08/11/2017  PT End of Session - 08/11/17 1539    Visit Number  2    Number of Visits  13    Date for PT Re-Evaluation  09/21/17    Authorization Type  UHC, UMR    Authorization Time Period  NA    Authorization - Visit Number  2    Authorization - Number of Visits  30 pt, ot, slp    PT Start Time  1607    PT Stop Time  1258    PT Time Calculation (min)  73 min    Equipment Utilized During Treatment  --    Activity Tolerance  Patient tolerated treatment well    Behavior During Therapy  Anxious;Restless       Past Medical History:  Diagnosis Date  . Abdominal hernia   . Abscess of pulp of tooth 12/12/14   left side  . Anxiety   . Arthritis    back  and down flank  right side more than left  . B12 deficiency 06/12/2015  . Brain aneurysm    METAL COIL PREVENTS MRI IMAGING and CLIP  . Cirrhosis ([place])    Presumably alcoholic cirrhosis  . Complication of anesthesia   . Depression   . ETOH abuse    Quit in 07/2011   . Fatigue    due to Sjogren's disease  . Folate deficiency 06/30/2016  . GERD (gastroesophageal reflux disease)   . HTN (hypertension)    no longer medicated, dated on 04/25/12  . IBS (irritable bowel syndrome)   . Iron deficiency anemia due to chronic blood loss   . Myofascial pain   . Panic attack   . PONV (postoperative nausea and vomiting)   . Psoriatic arthritis ([place])   . Right inguinal hernia 08/16/2012  . Seizures ([place]) 1997   1 seizure from coil insertion but no more seizures and no meds for seizures  . Sjogren's disease ([place])   . [place] HEAD 02/03/2007    Qualifier: Diagnosis of  By: Aline Brochure MD, Dorothyann Peng    . 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([place])     Past Surgical History:  Procedure Laterality Date  . AGILE CAPSULE N/A 06/21/2014   Procedure: AGILE CAPSULE;  Surgeon: Danie Binder, MD;  [facility];  Service: Endoscopy;  Laterality: N/A;  0700  . BIOPSY  10/13/2011   SLF: mild gastritis/duodenal polypoid lesion   . CEREBRAL ANEURYSM REPAIR  1997  . COLONOSCOPY  10/13/2011   PXT:GGYIRSWN hemorrhoids/varices rectal/lesion in ascending colon(HYPERPLASTIC POLYP)  . COLONOSCOPY N/A 01/17/2013   SLF: 1. Internal hemorrhoids 2. Ascending colon polyps 3.  Rectal varices.   . COLONOSCOPY N/A 08/10/2016   Dr. Oneida Alar: redundant left colon. external and internal hemorhroids. 5-10 year surveillance  . ESOPHAGOGASTRODUODENOSCOPY N/A 06/29/2014   SLF: 1. anemia due to polypoid lesions in teh antrum, and duodenal polyps 2. single duodenum AVM  . FLEXIBLE SIGMOIDOSCOPY N/A 01/21/2016   Procedure: FLEXIBLE SIGMOIDOSCOPY;  Surgeon: Danie Binder, MD;  [facility];  Service: Endoscopy;  Laterality: N/A;  245  . GIVENS CAPSULE STUDY N/A 06/29/2014   Procedure: GIVENS CAPSULE STUDY;  Surgeon: Carlyon Prows  Rexene Edison, MD;  Location[facility];  Service: Endoscopy;  Laterality: N/A;  . GIVENS CAPSULE STUDY N/A 07/13/2014   Procedure: GIVENS CAPSULE STUDY;  Surgeon: Danie Binder, MD;  [facility];  Service: Endoscopy;  Laterality: N/A;  700  . HEMORRHOID BANDING N/A 01/21/2016   Procedure: HEMORRHOID BANDING;  Surgeon: Danie Binder, MD;  [facility];  Service: Endoscopy;  Laterality: N/A;  . INGUINAL HERNIA REPAIR Right 08/28/2013   Procedure: RIGHT NGUINAL HERNIORRHAPHY WITH MESH;  Surgeon: Jamesetta So, MD;  Location: [facility];  Service: General;  Laterality: Right;  . INSERTION OF MESH N/A 06/09/2013   Procedure: INSERTION OF MESH;  Surgeon: Jamesetta So, MD;  Location: [facility];  Service: General;   Laterality: N/A;  . INSERTION OF MESH Right 08/28/2013   Procedure: INSERTION OF MESH;  Surgeon: Jamesetta So, MD;  Location: [facility];  Service: General;  Laterality: Right;  . PARACENTESIS  Feb 2014   10.7 liters  . TOOTH EXTRACTION Left 12/12/14  . UMBILICAL HERNIA REPAIR N/A 06/09/2013   Procedure: UMBILICAL HERNIORRHAPHY WITH MESH;  Surgeon: Jamesetta So, MD;  Location: [facility];  Service: General;  Laterality: N/A;    There were no vitals filed for this visit.  Subjective Assessment - 08/11/17 1148    Subjective  Felt better a few hours later. Laid down 7:30 pm and after 15 minutes felt dizzy but no diplopia. Head feels less full, not pressure behind left eye. Still having a horizontal shift when turning her head., but not as bad as yesterday pre-treatment.     Patient is accompained by:  Family member    Pertinent History  HTN, ETOH abuse, cirrhosis, brain aneurysm, Sjogren's disease, basilar aneurysm clip and coiling, Lt MCA aneurysm    Diagnostic tests  CT scans x 2 (cannot do MRI becasue of the clips & coils);     Patient Stated Goals  stop having dizziness and get back to walking without LOB    Currently in Pain?  No/denies    Pain Onset  --         [facility] PT Assessment - 08/11/17 1156      Functional Gait  Assessment   Gait assessed   Yes    Gait Level Surface  Walks 20 ft in less than 7 sec but greater than 5.5 sec, uses assistive device, slower speed, mild gait deviations, or deviates 6-10 in outside of the 12 in walkway width. 5.9    Change in Gait Speed  Able to smoothly change walking speed without loss of balance or gait deviation. Deviate no more than 6 in outside of the 12 in walkway width.    Gait with Horizontal Head Turns  Performs head turns smoothly with slight change in gait velocity (eg, minor disruption to smooth gait path), deviates 6-10 in outside 12 in walkway width, or uses an assistive device.    Gait with Vertical Head Turns  Performs head turns with no  change in gait. Deviates no more than 6 in outside 12 in walkway width.    Gait and Pivot Turn  Pivot turns safely within 3 sec and stops quickly with no loss of balance.    Step Over Obstacle  Is able to step over 2 stacked shoe boxes taped together (9 in total height) without changing gait speed. No evidence of imbalance.    Gait with Narrow Base of Support  Ambulates less than 4 steps  heel to toe or cannot perform without assistance. turns feet out to 45 degrees; even when told     Gait with Eyes Closed  Walks 20 ft, slow speed, abnormal gait pattern, evidence for imbalance, deviates 10-15 in outside 12 in walkway width. Requires more than 9 sec to ambulate 20 ft.    Ambulating Backwards  Walks 20 ft, uses assistive device, slower speed, mild gait deviations, deviates 6-10 in outside 12 in walkway width.    Steps  Two feet to a stair, must use rail. step over step ascend; sideways step to descend    Total Score  20         Vestibular Assessment - 08/11/17 0001      Vestibular Assessment   General Observation  walked into clinic without RW; slight deviation off path with independent recovery      Symptom Behavior   Type of Dizziness  Unsteady with head/body turns    Frequency of Dizziness  daily    Duration of Dizziness  seconds    Aggravating Factors  Turning head quickly    Relieving Factors  Head stationary      Head hanging test with neck extension >=60 degrees-asymptomatic into position and while in position; +vertigo on return to upright sitting (happened in seconds and could not discern if there was nystagmus). As so many of pt's symptoms consistent with atypical, apogeotropic left posterior canal BPPV, proceeded with treatment.      Vestibular Treatment/Exercise - 08/11/17 1239      Vestibular Treatment/Exercise   Vestibular Treatment Provided  Canalith Repositioning    Canalith Repositioning  Comment      OTHER   Comment  semi-demont maneuver to right to treat lt  posterior canal (rt head turn 45*, lie on rt side x 2 minutes, then quick return to sit). Pt with brief vertigo 1st step, no symptoms 2nd step. Performed demi-Semont to left (to treat rt posterior canal). Pt asymptomatic step 1, slight "shift" in vision step 2 and then reported she felt the best she has felt.           [facility] Adult PT Treatment/Exercise - 08/11/17 1156      Ambulation/Gait   Ambulation/Gait  Yes    Ambulation/Gait Assistance  5: Supervision    Ambulation/Gait Assistance Details  close supervision due to minor imbalance with independent recovery observed    Ambulation Distance (Feet)  120 Feet 75, 120    Assistive device  None    Gait Pattern  Step-through pattern;Wide base of support deviates from straight path; stutter step x 1    Ambulation Surface  Indoor    Gait velocity  normal-32.8 ft/8.85 sec= 3.71 ft/sec; fast 32.8 ft/7.00 sec= 4.69 ft/sec norm for age 57.85 ft/sec             PT Education - 08/11/17 1537    Education Details  symptoms consistent with atypical (apogeotropic) posterior canal BPPV; activity limitation of not lying flat x 24 hrs per researcher    Person(s) Educated  Patient;Spouse    Methods  Explanation;Handout    Comprehension  Verbalized understanding       PT Short Term Goals - 08/11/17 1557      PT SHORT TERM GOAL #1   Title  Patient will have negative positional testing for BPPV. (Target all STGs 08/31/2017)    Time  3    Period  Weeks    Status  New      PT SHORT TERM  GOAL #2   Title  Patient will complete balance assessments (DGI vs FGA vs Berg) and set LTGs as appropriate.     Baseline  08/11/17 FGA completed    Time  3    Period  Weeks    Status  Achieved      PT SHORT TERM GOAL #3   Title  Patient will be independent with basic HEP to address vestibular deficits.     Time  3    Period  Weeks    Status  New        PT Long Term Goals - 08/11/17 1558      PT LONG TERM GOAL #1   Title  Patient will be independent  with updated HEP and able to verbalize her plan for continued community-based activity upon d/c from PT. (Target for all LTGs 09/21/2017)    Time  6    Period  Weeks    Status  New      PT LONG TERM GOAL #2   Title  Patient will ambulate modified independently (incr time) over level indoor and outdoor surfaces up to 400 ft with no assistive device and utilizing compensatory strategies as indicated.     Time  6    Period  Weeks    Status  New      PT LONG TERM GOAL #3   Title  Patient will be able to verbalize warning signs of CVA (including posterior circulation CVAs) and appropriate steps to take if CVA suspected.     Time  6    Period  Weeks    Status  New      PT LONG TERM GOAL #4   Title  FOTO functional status score will improve to >=65/100 indicating improved overall functional abilities.     Time  6    Period  Weeks    Status  New      PT LONG TERM GOAL #5   Title  Patient will improve FGA >=24/30 to indicate lesser fall risk and clinically significant improvement in balance.     Time  6    Period  Weeks    Status  New            Plan - 08/11/17 1543    Clinical Impression Statement  Patient presented feeling better than on evaluation 5/14. Balance and gait further assessed prior to addressing BPPV symptoms (pt was too nauseated/sick to test balance after canalith repositioning 5/14). Her gait velocity is WNL, however FGA of 20/30 means she remains a moderate fall risk. Head hanging test equivocal for atypical posterior BPPV (negative for anterior canalithiasis) as she had no symptoms into and in position, however with +vision "shifting" on return to upright sitting lasting several seconds (unable to discern rotational component of nystagmus). Based on 5/14 exam, hypothesized lt posterior canal is affected side and perfomred demi-Semont with + results. Completed demi-Semont to "clear" right posterior canal with pt responding +ly to [facility]. Patient with questions again  re: Stroke symptoms vs BPPV (especially atypical posterior BPPV) and explained she continues to have elements that fit either diagnosis. Patient can benefit from continued vestibular rehab.     Rehab Potential  Good    Clinical Impairments Affecting Rehab Potential  anxiety, mixed peripheral and central vestibular deficits    PT Frequency  2x / week    PT Duration  6 weeks    PT Treatment/Interventions  ADLs/Self Care Home Management;Canalith Repostioning;Gait training;DME Instruction;Stair training;Functional mobility  training;Therapeutic activities;Therapeutic exercise;Balance training;Neuromuscular re-education;Cognitive remediation;Patient/family education;Passive range of motion;Vestibular;Visual/perceptual remediation/compensation    PT Next Visit Plan  make sure pt aware of 30 VL; Jeani Hawking had set plan for 6 weeks, not 8, but will keep appts in case need to extend; ? if still doing Brandt-Daroff (given at hospital for HEP); assess for BPPV if symptoms persist; if not, begin habituation (likely head turns in sitting) and corner balance ex's for HEP    Consulted and Agree with Plan of Care  Patient;Family member/caregiver    Family Member Consulted  husband       Patient will benefit from skilled therapeutic intervention in order to improve the following deficits and impairments:  Abnormal gait, Decreased activity tolerance, Decreased balance, Decreased cognition, Decreased mobility, Decreased knowledge of use of DME, Dizziness, Impaired vision/preception  Visit Diagnosis: BPPV (benign paroxysmal positional vertigo), bilateral  Difficulty in walking, not elsewhere classified  Dizziness and giddiness     Problem List Patient Active Problem List   Diagnosis Date Noted  . Benign paroxysmal positional vertigo   . 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 Essential hypertension 07/31/2017  . GERD (gastroesophageal reflux disease) 07/31/2017  . Alcohol abuse 07/31/2017  . Brain aneurysm 07/31/2017   . CKD (chronic kidney disease), stage III ([place]) 07/31/2017  . LBBB (left bundle branch block) 07/31/2017  . Dizziness 07/31/2017  . Nausea & vomiting 07/31/2017  . Folate deficiency 06/30/2016  . Constipation 04/01/2016  . 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 Hemorrhoids, internal, with bleeding 01/08/2016  . B12 deficiency 06/12/2015  . Psoriatic arthritis ([place]) 04/15/2015  . Nutritional anemia 09/19/2014  . Iron deficiency anemia due to chronic blood loss   . Insomnia 03/17/2014  . Depression 02/18/2014  . Labial swelling 08/23/2012  . Rash and nonspecific skin eruption 06/30/2012  . Colon polyps 11/18/2011  . Cirrhosis ([place]) 08/19/2011    Rexanne Mano, PT 08/11/2017, 4:03 PM  [facility] [address] [place], [place], [address] Phone: [phone]   Fax:  [phone]  Name: Lind Ausley MRN: [id number] Date of Birth: 1956-03-22

## 2017-08-15 NOTE — Telephone Encounter (Signed)
Called patient TO DISCUSS CONCERNS. LVM-CALL [phone] TODAY OR TOMORROW [phone] TO DISCUSS.   LVM: 1. REDUCE TO COLACE ONE AND LINZESS ONE DAILY IF HAVING DIARRHEA. 2. IT'S OK TO TAKE LIPITOR, NL HFP MAY 2019 3. IF NEEDED WILL FILL OUT A PA FOR LINZESS BECAUSE PT HAS FAILED STEP 1 THERAPY  IN THE PAST.

## 2017-08-16 ENCOUNTER — Encounter: Payer: Self-pay | Admitting: Physical Therapy

## 2017-08-16 ENCOUNTER — Ambulatory Visit: Payer: Commercial Managed Care - PPO | Admitting: Physical Therapy

## 2017-08-16 DIAGNOSIS — R42 Dizziness and giddiness: Secondary | ICD-10-CM

## 2017-08-16 DIAGNOSIS — H8113 Benign paroxysmal vertigo, bilateral: Secondary | ICD-10-CM | POA: Diagnosis not present

## 2017-08-16 DIAGNOSIS — R29818 Other symptoms and signs involving the nervous system: Secondary | ICD-10-CM

## 2017-08-16 NOTE — Patient Instructions (Addendum)
Gaze Stabilization: Tip Card  1.Target must remain mostly in focus, not becoming severely blurry, and appear stationary while head is in motion. 2.Perform exercises with small head movements (45 to either side of midline). Stay within range that does not cause double vision.  3.Increase speed of head motion so long as target is in focus. 4.If you wear eyeglasses, be sure you can see target through lens (therapist will give specific instructions for bifocal / progressive lenses). 5.These exercises may provoke dizziness or nausea. Work through these symptoms. If too nauseated (4 to 5), stop and rest. Rest between each exercise. 6.Exercises demand concentration; avoid distractions.  Copyright  VHI. All rights reserved.     Special Instructions: Exercises may bring on mild to moderate symptoms of nausea that resolve within 30 minutes of completing exercises. If symptoms are lasting longer than 30 minutes, modify your exercises by:  >decreasing the # of times you complete each activity >ensuring your symptoms return to baseline before moving onto the next exercise >dividing up exercises so you do not do them all in one session, but multiple short sessions throughout the day >doing them once a day until symptoms improve   Gaze Stabilization: Sitting    Sit on edge of seat. Keeping eyes on target on wall 3 feet away, and move head side to side for _30-60___ seconds. Rest between repetitions. Repeat a total of 3 reps. Repeat while moving head up and down for __30-60__ seconds. Repeat 3 reps.  Do __2-3__ sessions per day.  Copyright  VHI. All rights reserved.     Added forward bend to left LE habituation from [place]

## 2017-08-16 NOTE — Therapy (Signed)
[facility] [address] [place], [place], [address] Phone: [phone]   Fax:  [phone]  Physical Therapy Treatment  Patient Details  Name: Deanna Thompson MRN: [id number] Date of Birth: [birth year] Referring Provider: Vance Gather [facility]); Rosalin Hawking [facility] CVA team); Antony Contras [facility] Stroke MD)   Encounter Date: 08/16/2017  PT End of Session - 08/16/17 1925    Visit Number  3    Number of Visits  13    Date for PT Re-Evaluation  09/21/17    Authorization Type  UHC, UMR    Authorization Time Period  NA    Authorization - Visit Number  3    Authorization - Number of Visits  30 pt, ot, slp    PT Start Time  0845    PT Stop Time  0932    PT Time Calculation (min)  47 min    Activity Tolerance  Patient tolerated treatment well    Behavior During Therapy  Anxious;Restless       Past Medical History:  Diagnosis Date  . Abdominal hernia   . Abscess of pulp of tooth 12/12/14   left side  . Anxiety   . Arthritis    back  and down flank  right side more than left  . B12 deficiency 06/12/2015  . Brain aneurysm    METAL COIL PREVENTS MRI IMAGING and CLIP  . Cirrhosis ([place])    Presumably alcoholic cirrhosis  . Complication of anesthesia   . Depression   . ETOH abuse    Quit in 07/2011   . Fatigue    due to Sjogren's disease  . Folate deficiency 06/30/2016  . GERD (gastroesophageal reflux disease)   . HTN (hypertension)    no longer medicated, dated on 04/25/12  . IBS (irritable bowel syndrome)   . Iron deficiency anemia due to chronic blood loss   . Myofascial pain   . Panic attack   . PONV (postoperative nausea and vomiting)   . Psoriatic arthritis ([place])   . Right inguinal hernia 08/16/2012  . Seizures ([place]) 1997   1 seizure from coil insertion but no more seizures and no meds for seizures  . Sjogren's disease ([place])   . [place] HEAD 02/03/2007   Qualifier: Diagnosis of  By: Aline Brochure MD,  Dorothyann Peng    . 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([place])     Past Surgical History:  Procedure Laterality Date  . AGILE CAPSULE N/A 06/21/2014   Procedure: AGILE CAPSULE;  Surgeon: Danie Binder, MD;  [facility];  Service: Endoscopy;  Laterality: N/A;  0700  . BIOPSY  10/13/2011   SLF: mild gastritis/duodenal polypoid lesion   . CEREBRAL ANEURYSM REPAIR  1997  . COLONOSCOPY  10/13/2011   [id number] hemorrhoids/varices rectal/lesion in ascending colon(HYPERPLASTIC POLYP)  . COLONOSCOPY N/A 01/17/2013   SLF: 1. Internal hemorrhoids 2. Ascending colon polyps 3.  Rectal varices.   . COLONOSCOPY N/A 08/10/2016   Dr. Oneida Alar: redundant left colon. external and internal hemorhroids. 5-10 year surveillance  . ESOPHAGOGASTRODUODENOSCOPY N/A 06/29/2014   SLF: 1. anemia due to polypoid lesions in teh antrum, and duodenal polyps 2. single duodenum AVM  . FLEXIBLE SIGMOIDOSCOPY N/A 01/21/2016   Procedure: FLEXIBLE SIGMOIDOSCOPY;  Surgeon: Danie Binder, MD;  [facility];  Service: Endoscopy;  Laterality: N/A;  245  . GIVENS CAPSULE STUDY N/A 06/29/2014   Procedure: GIVENS CAPSULE STUDY;  Surgeon: Danie Binder, MD;  [facility];  Service: Endoscopy;  Laterality: N/A;  . GIVENS CAPSULE STUDY N/A 07/13/2014   Procedure: GIVENS CAPSULE STUDY;  Surgeon: Danie Binder, MD;  [facility];  Service: Endoscopy;  Laterality: N/A;  700  . HEMORRHOID BANDING N/A 01/21/2016   Procedure: HEMORRHOID BANDING;  Surgeon: Danie Binder, MD;  [facility];  Service: Endoscopy;  Laterality: N/A;  . INGUINAL HERNIA REPAIR Right 08/28/2013   Procedure: RIGHT NGUINAL HERNIORRHAPHY WITH MESH;  Surgeon: Jamesetta So, MD;  Location: [facility] ORS;  Service: General;  Laterality: Right;  . INSERTION OF MESH N/A 06/09/2013   Procedure: INSERTION OF MESH;  Surgeon: Jamesetta So, MD;  Location: [facility] ORS;  Service: General;  Laterality: N/A;  . INSERTION OF MESH Right 08/28/2013    Procedure: INSERTION OF MESH;  Surgeon: Jamesetta So, MD;  Location: [facility] ORS;  Service: General;  Laterality: Right;  . PARACENTESIS  Feb 2014   10.7 liters  . TOOTH EXTRACTION Left 12/12/14  . UMBILICAL HERNIA REPAIR N/A 06/09/2013   Procedure: UMBILICAL HERNIORRHAPHY WITH MESH;  Surgeon: Jamesetta So, MD;  Location: [facility] ORS;  Service: General;  Laterality: N/A;    There were no vitals filed for this visit.  Subjective Assessment - 08/16/17 0848    Subjective  I was better (the best I've been) but on the way in today I started feeling the pressure up front (pointing to her forehead). Fatigue is better.     Patient is accompained by:  Family member    Pertinent History  HTN, ETOH abuse, cirrhosis, brain aneurysm, Sjogren's disease, basilar aneurysm clip and coiling, Lt MCA aneurysm    Diagnostic tests  CT scans x 2 (cannot do MRI becasue of the clips & coils);     Patient Stated Goals  stop having dizziness and get back to walking without LOB    Currently in Pain?  No/denies                        Vestibular Treatment/Exercise - 08/16/17 0936      Vestibular Treatment/Exercise   Vestibular Treatment Provided  Habituation;Gaze    Habituation Exercises  Comment    Gaze Exercises  X1 Viewing Horizontal;X1 Viewing Vertical      X1 Viewing Horizontal   Foot Position  unsupported sitting    Time  -- 8-15 seconds    Reps  3    Comments  nausea up to 5/10; has to restrict ROM to prevent diplopia (      X1 Viewing Vertical   Foot Position  unsupported sitting    Time  -- 5 sec    Reps  1    Comments  nausea up to 5            PT Education - 08/16/17 1922    Education Details  the vision changes (diplopia, increased blurriness with head turn to right) more indicative of a central cause (pt hoping all her symptoms were related to BPPV and not a stroke); rationale for habituation exercises    Person(s) Educated  Patient;Spouse    Methods  Explanation     Comprehension  Verbalized understanding;Need further instruction       PT Short Term Goals - 08/11/17 1557      PT SHORT TERM GOAL #1   Title  Patient will have negative positional testing for BPPV. (Target all STGs 08/31/2017)    Time  3    Period  Weeks    Status  New      PT SHORT TERM GOAL #2   Title  Patient will complete balance assessments (DGI vs FGA vs Berg) and set LTGs as appropriate.     Baseline  08/11/17 FGA completed    Time  3    Period  Weeks    Status  Achieved      PT SHORT TERM GOAL #3   Title  Patient will be independent with basic HEP to address vestibular deficits.     Time  3    Period  Weeks    Status  New        PT Long Term Goals - 08/11/17 1558      PT LONG TERM GOAL #1   Title  Patient will be independent with updated HEP and able to verbalize her plan for continued community-based activity upon d/c from PT. (Target for all LTGs 09/21/2017)    Time  6    Period  Weeks    Status  New      PT LONG TERM GOAL #2   Title  Patient will ambulate modified independently (incr time) over level indoor and outdoor surfaces up to 400 ft with no assistive device and utilizing compensatory strategies as indicated.     Time  6    Period  Weeks    Status  New      PT LONG TERM GOAL #3   Title  Patient will be able to verbalize warning signs of CVA (including posterior circulation CVAs) and appropriate steps to take if CVA suspected.     Time  6    Period  Weeks    Status  New      PT LONG TERM GOAL #4   Title  FOTO functional status score will improve to >=65/100 indicating improved overall functional abilities.     Time  6    Period  Weeks    Status  New      PT LONG TERM GOAL #5   Title  Patient will improve FGA >=24/30 to indicate lesser fall risk and clinically significant improvement in balance.     Time  6    Period  Weeks    Status  New            Plan - 08/16/17 1926    Clinical Impression Statement  Deferred assessing canals for  BPPV to allow instruction in HEP (habituation). Patient with numerous questions throughout session and wants to have every detail of rationale for given exercises ("Is this due to the CVA or due to the BPPV?"). Pt noted to have diplopia anytime she tipped her head downward and looked forward (eyes in relative elevation). Increased reports of nausea thorughout session this date. Pt can continue to benefit from PT to address her vetibular deficis.     Rehab Potential  Good    Clinical Impairments Affecting Rehab Potential  anxiety, mixed peripheral and central vestibular deficits    PT Frequency  2x / week    PT Duration  6 weeks    PT Treatment/Interventions  ADLs/Self Care Home Management;Canalith Repostioning;Gait training;DME Instruction;Stair training;Functional mobility training;Therapeutic activities;Therapeutic exercise;Balance training;Neuromuscular re-education;Cognitive remediation;Patient/family education;Passive range of motion;Vestibular;Visual/perceptual remediation/compensation    PT Next Visit Plan  make sure pt aware of 30 VL; Jeani Hawking had set plan for 6 weeks, not 8, but will keep appts in case need to extend; assess for BPPV if symptoms persist; if not, begin habituation (likely  head turns in sitting) and corner balance ex's for HEP    Consulted and Agree with Plan of Care  Patient;Family member/caregiver    Family Member Consulted  husband       Patient will benefit from skilled therapeutic intervention in order to improve the following deficits and impairments:  Abnormal gait, Decreased activity tolerance, Decreased balance, Decreased cognition, Decreased mobility, Decreased knowledge of use of DME, Dizziness, Impaired vision/preception  Visit Diagnosis: Dizziness and giddiness  Other symptoms and signs involving the nervous system     Problem List Patient Active Problem List   Diagnosis Date Noted  . Benign paroxysmal positional vertigo   . 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  Essential hypertension 07/31/2017  . GERD (gastroesophageal reflux disease) 07/31/2017  . Alcohol abuse 07/31/2017  . Brain aneurysm 07/31/2017  . CKD (chronic kidney disease), stage III ([place]) 07/31/2017  . LBBB (left bundle branch block) 07/31/2017  . Dizziness 07/31/2017  . Nausea & vomiting 07/31/2017  . Folate deficiency 06/30/2016  . Constipation 04/01/2016  . 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 Hemorrhoids, internal, with bleeding 01/08/2016  . B12 deficiency 06/12/2015  . Psoriatic arthritis ([place]) 04/15/2015  . Nutritional anemia 09/19/2014  . Iron deficiency anemia due to chronic blood loss   . Insomnia 03/17/2014  . Depression 02/18/2014  . Labial swelling 08/23/2012  . Rash and nonspecific skin eruption 06/30/2012  . Colon polyps 11/18/2011  . Cirrhosis ([place]) 08/19/2011    Rexanne Mano, PT 08/16/2017, 7:34 PM  [facility] [address] [place], [place], [address] Phone: [phone]   Fax:  [phone]  Name: Deanna Thompson MRN: [id number] Date of Birth: 1955-04-23

## 2017-08-17 DIAGNOSIS — Z029 Encounter for administrative examinations, unspecified: Secondary | ICD-10-CM

## 2017-08-18 ENCOUNTER — Ambulatory Visit: Payer: Commercial Managed Care - PPO | Admitting: Physical Therapy

## 2017-08-18 ENCOUNTER — Encounter: Payer: Self-pay | Admitting: Physical Therapy

## 2017-08-18 DIAGNOSIS — R29818 Other symptoms and signs involving the nervous system: Secondary | ICD-10-CM

## 2017-08-18 DIAGNOSIS — R42 Dizziness and giddiness: Secondary | ICD-10-CM

## 2017-08-18 DIAGNOSIS — H8113 Benign paroxysmal vertigo, bilateral: Secondary | ICD-10-CM | POA: Diagnosis not present

## 2017-08-18 NOTE — Therapy (Signed)
[facility] [address] [place], [place], [address] Phone: [phone]   Fax:  [phone]  Physical Therapy Treatment  Patient Details  Name: Deanna Thompson MRN: [id number] Date of Birth: September 06, 1955 Referring Provider: Vance Gather [facility]); Rosalin Hawking [facility] CVA team); Antony Contras ([facility] Stroke MD)   Encounter Date: 08/18/2017  PT End of Session - 08/18/17 2005    Visit Number  4    Number of Visits  13    Date for PT Re-Evaluation  09/21/17    Authorization Type  UHC, UMR    Authorization Time Period  NA    Authorization - Visit Number  4    Authorization - Number of Visits  30 pt, ot, slp    PT Start Time  1150    PT Stop Time  1232    PT Time Calculation (min)  42 min    Activity Tolerance  Patient tolerated treatment well    Behavior During Therapy  WFL for tasks assessed/performed       Past Medical History:  Diagnosis Date  . Abdominal hernia   . Abscess of pulp of tooth 12/12/14   left side  . Anxiety   . Arthritis    back  and down flank  right side more than left  . B12 deficiency 06/12/2015  . Brain aneurysm    METAL COIL PREVENTS MRI IMAGING and CLIP  . Cirrhosis ([place])    Presumably alcoholic cirrhosis  . Complication of anesthesia   . Depression   . ETOH abuse    Quit in 07/2011   . Fatigue    due to Sjogren's disease  . Folate deficiency 06/30/2016  . GERD (gastroesophageal reflux disease)   . HTN (hypertension)    no longer medicated, dated on 04/25/12  . IBS (irritable bowel syndrome)   . Iron deficiency anemia due to chronic blood loss   . Myofascial pain   . Panic attack   . PONV (postoperative nausea and vomiting)   . Psoriatic arthritis ([place])   . Right inguinal hernia 08/16/2012  . Seizures ([place]) 1997   1 seizure from coil insertion but no more seizures and no meds for seizures  . Sjogren's disease ([place])   . [place] HEAD 02/03/2007   Qualifier: Diagnosis of   By: Aline Brochure MD, Dorothyann Peng    . 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([place])     Past Surgical History:  Procedure Laterality Date  . AGILE CAPSULE N/A 06/21/2014   Procedure: AGILE CAPSULE;  Surgeon: Danie Binder, MD;  [facility];  Service: Endoscopy;  Laterality: N/A;  0700  . BIOPSY  10/13/2011   SLF: mild gastritis/duodenal polypoid lesion   . CEREBRAL ANEURYSM REPAIR  1997  . COLONOSCOPY  10/13/2011   UXN:[id number] hemorrhoids/varices rectal/lesion in ascending colon(HYPERPLASTIC POLYP)  . COLONOSCOPY N/A 01/17/2013   SLF: 1. Internal hemorrhoids 2. Ascending colon polyps 3.  Rectal varices.   . COLONOSCOPY N/A 08/10/2016   Dr. Oneida Alar: redundant left colon. external and internal hemorhroids. 5-10 year surveillance  . ESOPHAGOGASTRODUODENOSCOPY N/A 06/29/2014   SLF: 1. anemia due to polypoid lesions in teh antrum, and duodenal polyps 2. single duodenum AVM  . FLEXIBLE SIGMOIDOSCOPY N/A 01/21/2016   Procedure: FLEXIBLE SIGMOIDOSCOPY;  Surgeon: Danie Binder, MD;  [facility];  Service: Endoscopy;  Laterality: N/A;  245  . GIVENS CAPSULE STUDY N/A 06/29/2014   Procedure: GIVENS CAPSULE STUDY;  Surgeon: Danie Binder, MD;  Location: [facility];  Service: Endoscopy;  Laterality: N/A;  . GIVENS CAPSULE STUDY N/A 07/13/2014   Procedure: GIVENS CAPSULE STUDY;  Surgeon: Danie Binder, MD;  [facility]: [facility];  Service: Endoscopy;  Laterality: N/A;  700  . HEMORRHOID BANDING N/A 01/21/2016   Procedure: HEMORRHOID BANDING;  Surgeon: Danie Binder, MD;  [facility];  Service: Endoscopy;  Laterality: N/A;  . INGUINAL HERNIA REPAIR Right 08/28/2013   Procedure: RIGHT NGUINAL HERNIORRHAPHY WITH MESH;  Surgeon: Jamesetta So, MD;  Location: [facility] ORS;  Service: General;  Laterality: Right;  . INSERTION OF MESH N/A 06/09/2013   Procedure: INSERTION OF MESH;  Surgeon: Jamesetta So, MD;  Location: [facility] ORS;  Service: General;  Laterality: N/A;  . INSERTION OF  MESH Right 08/28/2013   Procedure: INSERTION OF MESH;  Surgeon: Jamesetta So, MD;  Location: [facility] ORS;  Service: General;  Laterality: Right;  . PARACENTESIS  Feb 2014   10.7 liters  . TOOTH EXTRACTION Left 12/12/14  . UMBILICAL HERNIA REPAIR N/A 06/09/2013   Procedure: UMBILICAL HERNIORRHAPHY WITH MESH;  Surgeon: Jamesetta So, MD;  Location: [facility] ORS;  Service: General;  Laterality: N/A;    There were no vitals filed for this visit.  Subjective Assessment - 08/18/17 1159    Subjective  I had a lot of processing to do after last visit (when signs of CVA, not BPPV were observed); How do I know what symptoms to watch out for with regard to likely BPPV vs likely CVA?Marland Kitchen All day today it has been a 3 (the nausea, urgency with bowels, head pressure, feels like if I move my head wrong).     Patient is accompained by:  Family member    Pertinent History  HTN, ETOH abuse, cirrhosis, brain aneurysm, Sjogren's disease, basilar aneurysm clip and coiling, Lt MCA aneurysm    Diagnostic tests  CT scans x 2 (cannot do MRI becasue of the clips & coils);     Patient Stated Goals  stop having dizziness and get back to walking without LOB    Currently in Pain?  Yes    Pain Score  3     Pain Location  Head    Pain Descriptors / Indicators  Aching             Vestibular Assessment - 08/18/17 1210      Occulomotor Exam   Spontaneous  Absent frenzel lenses      Positional Testing   Sidelying Test  Sidelying Right;Sidelying Left      Sidelying Right   Sidelying Right Duration  0 with frenzel lenses    Sidelying Right Symptoms  Upbeat Nystagmus denied incr symptoms; <5 seconds nystagmus      Sidelying Left   Sidelying Left Duration  0 with frenzel lenses    Sidelying Left Symptoms  No nystagmus                Vestibular Treatment/Exercise - 08/18/17 1951      Vestibular Treatment/Exercise   Gaze Exercises  Eye/Head Exercise Horizontal      Eye/Head Exercise Horizontal   Foot Position   seated    Reps  4, 4, 7 counting Rt=1, Lt=2, Rt=3    Comments  pt had to pause after head turns to meet direction of eyes with left head turns worse than right; symptoms escalated to 5/10 when stopped             PT Education - 08/18/17  45    Education Details  symptoms that could mean another CVA and need to call 911 to go to ED (sudden change in vision--diplopia, loss of vision in certain fields; nausea/vertigo that is continuous--not just elicited by movement)     Person(s) Educated  Patient    Methods  Explanation    Comprehension  Verbalized understanding;Need further instruction       PT Short Term Goals - 08/11/17 1557      PT SHORT TERM GOAL #1   Title  Patient will have negative positional testing for BPPV. (Target all STGs 08/31/2017)    Time  3    Period  Weeks    Status  New      PT SHORT TERM GOAL #2   Title  Patient will complete balance assessments (DGI vs FGA vs Berg) and set LTGs as appropriate.     Baseline  08/11/17 FGA completed    Time  3    Period  Weeks    Status  Achieved      PT SHORT TERM GOAL #3   Title  Patient will be independent with basic HEP to address vestibular deficits.     Time  3    Period  Weeks    Status  New        PT Long Term Goals - 08/11/17 1558      PT LONG TERM GOAL #1   Title  Patient will be independent with updated HEP and able to verbalize her plan for continued community-based activity upon d/c from PT. (Target for all LTGs 09/21/2017)    Time  6    Period  Weeks    Status  New      PT LONG TERM GOAL #2   Title  Patient will ambulate modified independently (incr time) over level indoor and outdoor surfaces up to 400 ft with no assistive device and utilizing compensatory strategies as indicated.     Time  6    Period  Weeks    Status  New      PT LONG TERM GOAL #3   Title  Patient will be able to verbalize warning signs of CVA (including posterior circulation CVAs) and appropriate steps to take if CVA suspected.      Time  6    Period  Weeks    Status  New      PT LONG TERM GOAL #4   Title  FOTO functional status score will improve to >=65/100 indicating improved overall functional abilities.     Time  6    Period  Weeks    Status  New      PT LONG TERM GOAL #5   Title  Patient will improve FGA >=24/30 to indicate lesser fall risk and clinically significant improvement in balance.     Time  6    Period  Weeks    Status  New            Plan - 08/18/17 2008    Clinical Impression Statement  Patient continues with numerous questions related to CVA symptoms vs BPPV symptoms (having had both during hospitalization). Wants explanation re: whether a test or intervention is addressing BPPV or CVA. Explained at this time, it appears she no longer has BPPV and we are dealing with residual effects of CVA. Patient educated in signs and symptoms of CVA (especially posterior circulation CVA). Told patient to stop doing VORx1 exercise (she reported she actually has never  started doing them). Educated in compensatory saccades and to continue her forward bending habituation exercise. Extra time educating on how to "dose" her exercises based on her tolerance (which can vary session to session depending on general fatigue or eye strain/fatigue). Patient is progressing and can continue to benefit from PT to reduce dizziness and imbalance to reduce risk of falling.     Rehab Potential  Good    Clinical Impairments Affecting Rehab Potential  anxiety, mixed peripheral and central vestibular deficits    PT Frequency  2x / week    PT Duration  6 weeks    PT Treatment/Interventions  ADLs/Self Care Home Management;Canalith Repostioning;Gait training;DME Instruction;Stair training;Functional mobility training;Therapeutic activities;Therapeutic exercise;Balance training;Neuromuscular re-education;Cognitive remediation;Patient/family education;Passive range of motion;Vestibular;Visual/perceptual remediation/compensation     PT Next Visit Plan  MAKE SURE pt AWARE of 30 VL; Jeani Hawking had set plan for 6 weeks, not 8, but will keep appts in case need to extend;      check understanding and toleration of HEP (compensatory saccades & habituation; try corner balance ex's and ?add to HEP    Consulted and Agree with Plan of Care  Patient;Family member/caregiver    Family Member Consulted  husband       Patient will benefit from skilled therapeutic intervention in order to improve the following deficits and impairments:  Abnormal gait, Decreased activity tolerance, Decreased balance, Decreased cognition, Decreased mobility, Decreased knowledge of use of DME, Dizziness, Impaired vision/preception  Visit Diagnosis: Dizziness and giddiness  Other symptoms and signs involving the nervous system     Problem List Patient Active Problem List   Diagnosis Date Noted  . Benign paroxysmal positional vertigo   . 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 Essential hypertension 07/31/2017  . GERD (gastroesophageal reflux disease) 07/31/2017  . Alcohol abuse 07/31/2017  . Brain aneurysm 07/31/2017  . CKD (chronic kidney disease), stage III ([place]) 07/31/2017  . LBBB (left bundle branch block) 07/31/2017  . Dizziness 07/31/2017  . Nausea & vomiting 07/31/2017  . Folate deficiency 06/30/2016  . Constipation 04/01/2016  . 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 Hemorrhoids, internal, with bleeding 01/08/2016  . B12 deficiency 06/12/2015  . Psoriatic arthritis ([place]) 04/15/2015  . Nutritional anemia 09/19/2014  . Iron deficiency anemia due to chronic blood loss   . Insomnia 03/17/2014  . Depression 02/18/2014  . Labial swelling 08/23/2012  . Rash and nonspecific skin eruption 06/30/2012  . Colon polyps 11/18/2011  . Cirrhosis ([place]) 08/19/2011    Rexanne Mano, PT 08/18/2017, 8:17 PM  [facility] [address] [place], [place], [address] Phone: [phone]   Fax:   [phone]  Name: Deanna Thompson MRN: [id number] Date of Birth: July 20, 1955

## 2017-08-18 NOTE — Patient Instructions (Addendum)
  Stop doing "A" exercise given last visit    Compensatory Strategies: Corrective Saccades    1. Holding (or tape to the wall) two stationary targets ("E") placed __10__ inches apart at eye level, move eyes to target, keep head still. 2. Then move head in direction of target while eyes remain on target. Stay still until letter looks clear.  Then Repeat in opposite direction.  (lead with eyes, then head, eyes, then head) Perform sitting. Repeat sequence __5-10__ times if symptoms are 5 or less out of 10. Do __3__ sessions per day.

## 2017-08-19 ENCOUNTER — Ambulatory Visit: Payer: Commercial Managed Care - PPO

## 2017-08-24 ENCOUNTER — Telehealth: Payer: Self-pay | Admitting: General Practice

## 2017-08-24 DIAGNOSIS — K7460 Unspecified cirrhosis of liver: Secondary | ICD-10-CM

## 2017-08-24 NOTE — Addendum Note (Signed)
Addended by: Hassan Rowan on: 08/24/2017 02:02 PM   Modules accepted: Orders

## 2017-08-24 NOTE — Telephone Encounter (Signed)
US abdomen RUQ scheduled for 09/07/17 at 8:30am, arrive at 8:15am. NPO after midnight before test. Tried to call pt, no answer, LMOAM test had been scheduled (MyChart is active). Letter mailed.

## 2017-08-24 NOTE — Telephone Encounter (Signed)
Per last abd ultrasound dated 02/2017 patient will need a repeat abd u/s in 08/2017, RE: Cirrhosis  She would like to have this scheduled for June 2019.  She can be reached at [phone].

## 2017-08-26 ENCOUNTER — Encounter: Payer: Self-pay | Admitting: Physical Therapy

## 2017-08-26 ENCOUNTER — Ambulatory Visit: Payer: Commercial Managed Care - PPO | Admitting: Physical Therapy

## 2017-08-26 DIAGNOSIS — H8113 Benign paroxysmal vertigo, bilateral: Secondary | ICD-10-CM | POA: Diagnosis not present

## 2017-08-26 DIAGNOSIS — R29818 Other symptoms and signs involving the nervous system: Secondary | ICD-10-CM

## 2017-08-26 DIAGNOSIS — R42 Dizziness and giddiness: Secondary | ICD-10-CM

## 2017-08-26 NOTE — Therapy (Signed)
[facility] [address] [place], [place], [address] Phone: [phone]   Fax:  [phone]  Physical Therapy Treatment  Patient Details  Name: Deanna Thompson MRN: [id number] Date of Birth: 06/13/1955 Referring Provider: Vance Gather [facility]); Rosalin [facility] CVA team); Antony Contras ([facility] Stroke MD)   Encounter Date: 08/26/2017  PT End of Session - 08/26/17 1324    Visit Number  5    Number of Visits  13    Date for PT Re-Evaluation  09/21/17    Authorization Type  UHC, UMR    Authorization Time Period  NA    Authorization - Visit Number  5    Authorization - Number of Visits  30 pt, ot, slp    PT Start Time  0800    PT Stop Time  0847    PT Time Calculation (min)  47 min    Activity Tolerance  Patient tolerated treatment well    Behavior During Therapy  [facility] for tasks assessed/performed       Past Medical History:  Diagnosis Date  . Abdominal hernia   . Abscess of pulp of tooth 12/12/14   left side  . Anxiety   . Arthritis    back  and down flank  right side more than left  . B12 deficiency 06/12/2015  . Brain aneurysm    METAL COIL PREVENTS MRI IMAGING and CLIP  . Cirrhosis ([place])    Presumably alcoholic cirrhosis  . Complication of anesthesia   . Depression   . ETOH abuse    Quit in 07/2011   . Fatigue    due to Sjogren's disease  . Folate deficiency 06/30/2016  . GERD (gastroesophageal reflux disease)   . HTN (hypertension)    no longer medicated, dated on 04/25/12  . IBS (irritable bowel syndrome)   . Iron deficiency anemia due to chronic blood loss   . Myofascial pain   . Panic attack   . PONV (postoperative nausea and vomiting)   . Psoriatic arthritis ([place])   . Right inguinal hernia 08/16/2012  . Seizures ([place]) 1997   1 seizure from coil insertion but no more seizures and no meds for seizures  . Sjogren's disease ([place])   . [place] HEAD 02/03/2007   Qualifier: Diagnosis of   By: Aline Brochure MD, Dorothyann Peng    . 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([place])     Past Surgical History:  Procedure Laterality Date  . AGILE CAPSULE N/A 06/21/2014   Procedure: AGILE CAPSULE;  Surgeon: Danie Binder, MD;  [facility];  Service: Endoscopy;  Laterality: N/A;  0700  . BIOPSY  10/13/2011   SLF: mild gastritis/duodenal polypoid lesion   . CEREBRAL ANEURYSM REPAIR  1997  . COLONOSCOPY  10/13/2011   ONG:EXBMWUXL hemorrhoids/varices rectal/lesion in ascending colon(HYPERPLASTIC POLYP)  . COLONOSCOPY N/A 01/17/2013   SLF: 1. Internal hemorrhoids 2. Ascending colon polyps 3.  Rectal varices.   . COLONOSCOPY N/A 08/10/2016   Dr. Oneida Alar: redundant left colon. external and internal hemorhroids. 5-10 year surveillance  . ESOPHAGOGASTRODUODENOSCOPY N/A 06/29/2014   SLF: 1. anemia due to polypoid lesions in teh antrum, and duodenal polyps 2. single duodenum AVM  . FLEXIBLE SIGMOIDOSCOPY N/A 01/21/2016   Procedure: FLEXIBLE SIGMOIDOSCOPY;  Surgeon: Danie Binder, MD;  [facility];  Service: Endoscopy;  Laterality: N/A;  245  . GIVENS CAPSULE STUDY N/A 06/29/2014   Procedure: GIVENS CAPSULE STUDY;  Surgeon: Danie Binder, MD;  Location: [facility];  Service: Endoscopy;  Laterality: N/A;  . GIVENS CAPSULE STUDY N/A 07/13/2014   Procedure: GIVENS CAPSULE STUDY;  Surgeon: Danie Binder, MD;  [facility];  Service: Endoscopy;  Laterality: N/A;  700  . HEMORRHOID BANDING N/A 01/21/2016   Procedure: HEMORRHOID BANDING;  Surgeon: Danie Binder, MD;  [facility];  Service: Endoscopy;  Laterality: N/A;  . INGUINAL HERNIA REPAIR Right 08/28/2013   Procedure: RIGHT NGUINAL HERNIORRHAPHY WITH MESH;  Surgeon: Jamesetta So, MD;  Location: [facility] OR[facility];  Service: General;  Laterality: Right;  . INSERTION OF MESH N/A 06/09/2013   Procedure: INSERTION OF MESH;  Surgeon: Jamesetta So, MD;  Location: [facility];  Service: General;  Laterality: N/A;  . INSERTION OF  MESH Right 08/28/2013   Procedure: INSERTION OF MESH;  Surgeon: Jamesetta So, MD;  Location: [facility];  Service: General;  Laterality: Right;  . PARACENTESIS  Feb 2014   10.7 liters  . TOOTH EXTRACTION Left 12/12/14  . UMBILICAL HERNIA REPAIR N/A 06/09/2013   Procedure: UMBILICAL HERNIORRHAPHY WITH MESH;  Surgeon: Jamesetta So, MD;  Location: [facility] OR[facility];  Service: General;  Laterality: N/A;    There were no vitals filed for this visit.  Subjective Assessment - 08/26/17 0801    Subjective  I've had a few episodes of dizziness that were the "world shifting" but lasting <60 heads. Has been doing exercises and tolerating better. No spinning. It's like being carsick all the time. Still has dull frontal headache but improved.     Patient is accompained by:  Family member    Pertinent History  HTN, ETOH abuse, cirrhosis, brain aneurysm, Sjogren's disease, basilar aneurysm clip and coiling, Lt MCA aneurysm    Diagnostic tests  CT scans x 2 (cannot do MRI becasue of the clips & coils);     Patient Stated Goals  stop having dizziness and get back to walking without LOB    Currently in Pain?  No/denies             Vestibular Assessment - 08/26/17 9518      Vestibulo-Occular Reflex   VOR Cancellation  Corrective saccades begins to feel nauseated    Comment  HIT negative bil      Visual Acuity   Static  8    Dynamic  7                Vestibular Treatment/Exercise - 08/26/17 1320      Vestibular Treatment/Exercise   Gaze Exercises  X1 Viewing Horizontal;X1 Viewing Vertical;Eye/Head Exercise Horizontal      X1 Viewing Horizontal   Foot Position  seated    Reps  3    Comments  up to 25 seconds with beginning to see double and nausea up to 4; consistenly sees object is blurry when head turns to right (using left field of vision) and returns to normal when head turns left;       X1 Viewing Vertical   Foot Position  seated    Reps  2    Comments  up to 20 sec; object is blurry  when head moves down below horizontal and clears as she raises her head (glasses off due to progressive lenses); +nausea      Eye/Head Exercise Horizontal   Foot Position  seated    Reps  2    Comments  compensatory saccades x 30 seconds with no symptoms of nausea or imbalance  PT Education - 08/26/17 1324    Education Details  stop doing compensatory saccades and do VORx1 (habituation)    Person(s) Educated  Patient    Methods  Explanation;Demonstration;Handout    Comprehension  Verbalized understanding;Returned demonstration       PT Short Term Goals - 08/11/17 1557      PT SHORT TERM GOAL #1   Title  Patient will have negative positional testing for BPPV. (Target all STGs 08/31/2017)    Time  3    Period  Weeks    Status  New      PT SHORT TERM GOAL #2   Title  Patient will complete balance assessments (DGI vs FGA vs Berg) and set LTGs as appropriate.     Baseline  08/11/17 FGA completed    Time  3    Period  Weeks    Status  Achieved      PT SHORT TERM GOAL #3   Title  Patient will be independent with basic HEP to address vestibular deficits.     Time  3    Period  Weeks    Status  New        PT Long Term Goals - 08/11/17 1558      PT LONG TERM GOAL #1   Title  Patient will be independent with updated HEP and able to verbalize her plan for continued community-based activity upon d/c from PT. (Target for all LTGs 09/21/2017)    Time  6    Period  Weeks    Status  New      PT LONG TERM GOAL #2   Title  Patient will ambulate modified independently (incr time) over level indoor and outdoor surfaces up to 400 ft with no assistive device and utilizing compensatory strategies as indicated.     Time  6    Period  Weeks    Status  New      PT LONG TERM GOAL #3   Title  Patient will be able to verbalize warning signs of CVA (including posterior circulation CVAs) and appropriate steps to take if CVA suspected.     Time  6    Period  Weeks    Status  New       PT LONG TERM GOAL #4   Title  FOTO functional status score will improve to >=65/100 indicating improved overall functional abilities.     Time  6    Period  Weeks    Status  New      PT LONG TERM GOAL #5   Title  Patient will improve FGA >=24/30 to indicate lesser fall risk and clinically significant improvement in balance.     Time  6    Period  Weeks    Status  New            Plan - 08/26/17 1325    Clinical Impression Statement  Patient overall reports improvement. No symptoms with bending forward habituation or with compensatory saccades. Assessed for potential vestibular hypofunction (based on her description of "world has to catch up" when she turns her head. HIT negative bil and visual acuity WNL (1 line difference). Revised HEP and again emphasized working through nausea symptoms (up to 5/10) and stopping if double vision, target moving, or increased blurriness. Educated to keep eyes open when she stops with focus on target to help with relieving symptoms and retraining her brain.     Rehab Potential  Good  Clinical Impairments Affecting Rehab Potential  anxiety, mixed peripheral and central vestibular deficits    PT Frequency  2x / week    PT Duration  6 weeks    PT Treatment/Interventions  ADLs/Self Care Home Management;Canalith Repostioning;Gait training;DME Instruction;Stair training;Functional mobility training;Therapeutic activities;Therapeutic exercise;Balance training;Neuromuscular re-education;Cognitive remediation;Patient/family education;Passive range of motion;Vestibular;Visual/perceptual remediation/compensation    PT Next Visit Plan  check STGs by 6/4; check toleration of HEP (VORx1);  initiate corner balance ex's and add to HEP; work on balance training     PT Home Exercise Plan  seated forward bending (habituation); VORx1;     Consulted and Agree with Plan of Care  Patient;Family member/caregiver    Family Member Consulted  husband       Patient will  benefit from skilled therapeutic intervention in order to improve the following deficits and impairments:  Abnormal gait, Decreased activity tolerance, Decreased balance, Decreased cognition, Decreased mobility, Decreased knowledge of use of DME, Dizziness, Impaired vision/preception  Visit Diagnosis: Dizziness and giddiness  Other symptoms and signs involving the nervous system     Problem List Patient Active Problem List   Diagnosis Date Noted  . Benign paroxysmal positional vertigo   . 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 Essential hypertension 07/31/2017  . GERD (gastroesophageal reflux disease) 07/31/2017  . Alcohol abuse 07/31/2017  . Brain aneurysm 07/31/2017  . CKD (chronic kidney disease), stage III ([place]) 07/31/2017  . LBBB (left bundle branch block) 07/31/2017  . Dizziness 07/31/2017  . Nausea & vomiting 07/31/2017  . Folate deficiency 06/30/2016  . Constipation 04/01/2016  . 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 Hemorrhoids, internal, with bleeding 01/08/2016  . B12 deficiency 06/12/2015  . Psoriatic arthritis ([place]) 04/15/2015  . Nutritional anemia 09/19/2014  . Iron deficiency anemia due to chronic blood loss   . Insomnia 03/17/2014  . Depression 02/18/2014  . Labial swelling 08/23/2012  . Rash and nonspecific skin eruption 06/30/2012  . Colon polyps 11/18/2011  . Cirrhosis ([place]) 08/19/2011    Rexanne Mano, PT 08/26/2017, 1:33 PM  [facility] [address] [place], [place], [address] Phone: [phone]   Fax:  [phone]  Name: Deanna Thompson MRN: [id number] Date of Birth: Jul 24, 1955

## 2017-08-26 NOTE — Patient Instructions (Addendum)
  Stop doing exercise with 2 cards.  Gaze Stabilization: Tip Card  1.Target must remain mostly in focus, not becoming severely blurry, and appear stationary while head is in motion. 2.Perform exercises with small head movements (45 to either side of midline). Stay within range that does not cause double vision.  3.Increase speed of head motion so long as target is in focus. 4.If you wear eyeglasses, be sure you can see target through lens (therapist will give specific instructions for bifocal / progressive lenses). 5.These exercises may provoke dizziness or nausea. Work through these symptoms. If too nauseated (4 to 5), stop and rest. Rest between each exercise. 6.Exercises demand concentration; avoid distractions.  Copyright  VHI. All rights reserved.     Special Instructions: Exercises may bring on mild to moderate symptoms of nausea that resolve within 30 minutes of completing exercises. If symptoms are lasting longer than 30 minutes, modify your exercises by:  >decreasing the # of times you complete each activity >ensuring your symptoms return to baseline before moving onto the next exercise >dividing up exercises so you do not do them all in one session, but multiple short sessions throughout the day >doing them once a day until symptoms improve   Gaze Stabilization: Sitting    Sit on edge of seat. Keeping eyes on target on wall 3 feet away, and move head side to side for _30-60___ seconds. Rest between repetitions. Repeat a total of 3 reps. Repeat while moving head up and down for __30-60__ seconds. Repeat 3 reps.  Do __2-3__ sessions per day.

## 2017-08-27 ENCOUNTER — Other Ambulatory Visit: Payer: Self-pay | Admitting: Family Medicine

## 2017-08-27 ENCOUNTER — Other Ambulatory Visit: Payer: Self-pay | Admitting: Gastroenterology

## 2017-08-30 ENCOUNTER — Ambulatory Visit: Payer: Commercial Managed Care - PPO | Attending: Family Medicine | Admitting: Physical Therapy

## 2017-08-30 ENCOUNTER — Encounter: Payer: Self-pay | Admitting: Physical Therapy

## 2017-08-30 DIAGNOSIS — R42 Dizziness and giddiness: Secondary | ICD-10-CM | POA: Insufficient documentation

## 2017-08-30 DIAGNOSIS — R29818 Other symptoms and signs involving the nervous system: Secondary | ICD-10-CM | POA: Diagnosis present

## 2017-08-30 DIAGNOSIS — R262 Difficulty in walking, not elsewhere classified: Secondary | ICD-10-CM

## 2017-08-30 NOTE — Patient Instructions (Signed)
Balance: Eyes Closed - Bilateral (Varied Surfaces)   Stand with back to the corner and chair in front of you.   Copyright  VHI. All rights reserved.  Feet Partial Heel-Toe (Compliant Surface) Head Motion - Eyes Closed    Stand on compliant surface: feet together with right foot partially in front of the other. Eyes open turn head diagonally both sides x 5 reps each. Close eyes and hold for 30 seconds.  Do 2 sessions per day.  Copyright  VHI. All rights reserved.

## 2017-08-30 NOTE — Therapy (Signed)
[facility] [address] [place], [place], [address] Phone: [phone]   Fax:  [phone]  Physical Therapy Treatment  Patient Details  Name: Deanna Thompson MRN: [id number] Date of Birth: 1956-02-15 Referring Provider: Vance Gather [facility]); Rosalin Hawking [facility] CVA team); Antony Contras ([facility] Stroke MD)   Encounter Date: 08/30/2017  PT End of Session - 08/30/17 2013    Visit Number  6    Number of Visits  13    Date for PT Re-Evaluation  09/21/17    Authorization Type  UHC, UMR    Authorization Time Period  NA    Authorization - Visit Number  5    Authorization - Number of Visits  30 pt, ot, slp    PT Start Time  1020    PT Stop Time  1101    PT Time Calculation (min)  41 min    Activity Tolerance  Patient tolerated treatment well    Behavior During Therapy  WFL for tasks assessed/performed       Past Medical History:  Diagnosis Date  . Abdominal hernia   . Abscess of pulp of tooth 12/12/14   left side  . Anxiety   . Arthritis    back  and down flank  right side more than left  . B12 deficiency 06/12/2015  . Brain aneurysm    METAL COIL PREVENTS MRI IMAGING and CLIP  . Cirrhosis ([place])    Presumably alcoholic cirrhosis  . Complication of anesthesia   . Depression   . ETOH abuse    Quit in 07/2011   . Fatigue    due to Sjogren's disease  . Folate deficiency 06/30/2016  . GERD (gastroesophageal reflux disease)   . HTN (hypertension)    no longer medicated, dated on 04/25/12  . IBS (irritable bowel syndrome)   . Iron deficiency anemia due to chronic blood loss   . Myofascial pain   . Panic attack   . PONV (postoperative nausea and vomiting)   . Psoriatic arthritis ([place])   . Right inguinal hernia 08/16/2012  . Seizures ([place]) 1997   1 seizure from coil insertion but no more seizures and no meds for seizures  . Sjogren's disease ([place])   . [place] HEAD 02/03/2007   Qualifier: Diagnosis of   By: Aline Brochure MD, Dorothyann Peng    . 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([place])     Past Surgical History:  Procedure Laterality Date  . AGILE CAPSULE N/A 06/21/2014   Procedure: AGILE CAPSULE;  Surgeon: Danie Binder, MD;  [facility];  Service: Endoscopy;  Laterality: N/A;  0700  . BIOPSY  10/13/2011   SLF: mild gastritis/duodenal polypoid lesion   . CEREBRAL ANEURYSM REPAIR  1997  . COLONOSCOPY  10/13/2011   OHY:WVPXTGGY hemorrhoids/varices rectal/lesion in ascending colon(HYPERPLASTIC POLYP)  . COLONOSCOPY N/A 01/17/2013   SLF: 1. Internal hemorrhoids 2. Ascending colon polyps 3.  Rectal varices.   . COLONOSCOPY N/A 08/10/2016   Dr. Oneida Alar: redundant left colon. external and internal hemorhroids. 5-10 year surveillance  . ESOPHAGOGASTRODUODENOSCOPY N/A 06/29/2014   SLF: 1. anemia due to polypoid lesions in teh antrum, and duodenal polyps 2. single duodenum AVM  . FLEXIBLE SIGMOIDOSCOPY N/A 01/21/2016   Procedure: FLEXIBLE SIGMOIDOSCOPY;  Surgeon: Danie Binder, MD;  [facility];  Service: Endoscopy;  Laterality: N/A;  245  . GIVENS CAPSULE STUDY N/A 06/29/2014   Procedure: GIVENS CAPSULE STUDY;  Surgeon: Danie Binder, MD;  [facility];  Service: Endoscopy;  Laterality: N/A;  . GIVENS CAPSULE STUDY N/A 07/13/2014   Procedure: GIVENS CAPSULE STUDY;  Surgeon: Danie Binder, MD;  [facility];  Service: Endoscopy;  Laterality: N/A;  700  . HEMORRHOID BANDING N/A 01/21/2016   Procedure: HEMORRHOID BANDING;  Surgeon: Danie Binder, MD;  [facility];  Service: Endoscopy;  Laterality: N/A;  . INGUINAL HERNIA REPAIR Right 08/28/2013   Procedure: RIGHT NGUINAL HERNIORRHAPHY WITH MESH;  Surgeon: Jamesetta So, MD;  Location: [facility];  Service: General;  Laterality: Right;  . INSERTION OF MESH N/A 06/09/2013   Procedure: INSERTION OF MESH;  Surgeon: Jamesetta So, MD;  Location: [facility];  Service: General;  Laterality: N/A;  . INSERTION OF  MESH Right 08/28/2013   Procedure: INSERTION OF MESH;  Surgeon: Jamesetta So, MD;  Location: [facility];  Service: General;  Laterality: Right;  . PARACENTESIS  Feb 2014   10.7 liters  . TOOTH EXTRACTION Left 12/12/14  . UMBILICAL HERNIA REPAIR N/A 06/09/2013   Procedure: UMBILICAL HERNIORRHAPHY WITH MESH;  Surgeon: Jamesetta So, MD;  Location: [facility];  Service: General;  Laterality: N/A;    There were no vitals filed for this visit.  Subjective Assessment - 08/30/17 1024    Subjective  Wearing left aircast due to 'severe flatfoot.' per Podiatrist. Having pain in bil arches, however Podiatrist only wanted to brace one foot for balance concerns if tried to walk in 2 braces. 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 Very tired afterward.     Patient is accompained by:  Family member    Pertinent History  HTN, ETOH abuse, cirrhosis, brain aneurysm, Sjogren's disease, basilar aneurysm clip and coiling, Lt MCA aneurysm    Diagnostic tests  CT scans x 2 (cannot do MRI becasue of the clips & coils);     Patient Stated Goals  stop having dizziness and get back to walking without LOB    Currently in Pain?  No/denies                       [facility] Adult PT Treatment/Exercise - 08/30/17 0001      Ambulation/Gait   Ambulation/Gait Assistance  4: Min guard    Ambulation/Gait Assistance Details  during gait/balance assessments    Ambulation Distance (Feet)  80 Feet x 3    Assistive device  None    Gait Pattern  Step-through pattern;Wide base of support    Ambulation Surface  Indoor          Balance Exercises - 08/30/17 2008      Balance Exercises: Standing   Standing Eyes Opened  Narrow base of support (BOS);Head turns;Foam/compliant surface;Solid surface    Standing Eyes Closed  Narrow base of support (BOS);Foam/compliant surface;Solid surface    SLS  Eyes open;Upper extremity support 2;2 reps;10 secs    Step Ups   Forward;6 inch;Intermittent UE support    Gait with Head Turns  Forward    Tandem Gait  Forward;Retro;Upper extremity support    Partial Tandem Stance  Eyes open;Eyes closed;Foam/compliant surface    Turning  Right;Left        PT Education - 08/30/17 2012    Education Details  addition to HEP    Person(s) Educated  Patient    Methods  Explanation;Demonstration;Verbal cues;Handout    Comprehension  Need further instruction;Verbal cues required;Returned demonstration;Verbalized understanding  PT Short Term Goals - 08/30/17 1027      PT SHORT TERM GOAL #1   Title  Patient will have negative positional testing for BPPV. (Target all STGs 08/31/2017)    Time  3    Period  Weeks    Status  Achieved      PT SHORT TERM GOAL #2   Title  Patient will complete balance assessments (DGI vs FGA vs [place]) and set LTGs as appropriate.     Baseline  08/11/17 FGA completed    Time  3    Period  Weeks    Status  Achieved      PT SHORT TERM GOAL #3   Title  Patient will be independent with basic HEP to address vestibular deficits.     Time  3    Period  Weeks    Status  Achieved        PT Long Term Goals - 08/11/17 1558      PT LONG TERM GOAL #1   Title  Patient will be independent with updated HEP and able to verbalize her plan for continued community-based activity upon d/c from PT. (Target for all LTGs 09/21/2017)    Time  6    Period  Weeks    Status  New      PT LONG TERM GOAL #2   Title  Patient will ambulate modified independently (incr time) over level indoor and outdoor surfaces up to 400 ft with no assistive device and utilizing compensatory strategies as indicated.     Time  6    Period  Weeks    Status  New      PT LONG TERM GOAL #3   Title  Patient will be able to verbalize warning signs of CVA (including posterior circulation CVAs) and appropriate steps to take if CVA suspected.     Time  6    Period  Weeks    Status  New      PT LONG TERM GOAL #4   Title  FOTO  functional status score will improve to >=65/100 indicating improved overall functional abilities.     Time  6    Period  Weeks    Status  New      PT LONG TERM GOAL #5   Title  Patient will improve FGA >=24/30 to indicate lesser fall risk and clinically significant improvement in balance.     Time  6    Period  Weeks    Status  New            Plan - 08/30/17 2014    Clinical Impression Statement  Patient continues to make progress. STGs assessed with pt meeting 3 of 3 goals. BPPV appears to be cleared with session focused on oculomotor function (pt has smaller area of double vision in right field of vision--now right upper quadrant only). Focused also on HEP and added balance training.     Rehab Potential  Good    Clinical Impairments Affecting Rehab Potential  anxiety, mixed peripheral and central vestibular deficits    PT Frequency  2x / week    PT Duration  6 weeks    PT Treatment/Interventions  ADLs/Self Care Home Management;Canalith Repostioning;Gait training;DME Instruction;Stair training;Functional mobility training;Therapeutic activities;Therapeutic exercise;Balance training;Neuromuscular re-education;Cognitive remediation;Patient/family education;Passive range of motion;Vestibular;Visual/perceptual remediation/compensation    PT Next Visit Plan  check toleration of HEP (VORx1 ?progress to standing? and corner ex/HEP); work on balance training (compliant surfaces -as her feet/arches  allow) and EC    PT Home Exercise Plan  seated forward bending (habituation); VORx1; corner on pillow, feet partial tandem, EO head turns, EC head neutral    Consulted and Agree with Plan of Care  Patient;Family member/caregiver    Family Member Consulted  husband       Patient will benefit from skilled therapeutic intervention in order to improve the following deficits and impairments:  Abnormal gait, Decreased activity tolerance, Decreased balance, Decreased cognition, Decreased mobility,  Decreased knowledge of use of DME, Dizziness, Impaired vision/preception  Visit Diagnosis: Dizziness and giddiness  Difficulty in walking, not elsewhere classified     Problem List Patient Active Problem List   Diagnosis Date Noted  . Benign paroxysmal positional vertigo   . 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 Essential hypertension 07/31/2017  . GERD (gastroesophageal reflux disease) 07/31/2017  . Alcohol abuse 07/31/2017  . Brain aneurysm 07/31/2017  . CKD (chronic kidney disease), stage III ([place]) 07/31/2017  . LBBB (left bundle branch block) 07/31/2017  . Dizziness 07/31/2017  . Nausea & vomiting 07/31/2017  . Folate deficiency 06/30/2016  . Constipation 04/01/2016  . 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 Hemorrhoids, internal, with bleeding 01/08/2016  . B12 deficiency 06/12/2015  . Psoriatic arthritis ([place]) 04/15/2015  . Nutritional anemia 09/19/2014  . Iron deficiency anemia due to chronic blood loss   . Insomnia 03/17/2014  . Depression 02/18/2014  . Labial swelling 08/23/2012  . Rash and nonspecific skin eruption 06/30/2012  . Colon polyps 11/18/2011  . Cirrhosis ([place]) 08/19/2011    Rexanne Mano, PT 08/30/2017, 8:20 PM  [facility] [address] [place], [place], [address] Phone: [phone]   Fax:  [phone]  Name: Malaiyah Achorn MRN: [id number] Date of Birth: 1955-11-08

## 2017-09-02 ENCOUNTER — Encounter: Payer: Self-pay | Admitting: Adult Health

## 2017-09-02 ENCOUNTER — Ambulatory Visit (INDEPENDENT_AMBULATORY_CARE_PROVIDER_SITE_OTHER): Payer: Commercial Managed Care - PPO | Admitting: Adult Health

## 2017-09-02 VITALS — BP 119/69 | HR 73 | Ht 68.0 in | Wt 229.0 lb

## 2017-09-02 DIAGNOSIS — I6302 Cerebral infarction due to thrombosis of basilar artery: Secondary | ICD-10-CM | POA: Diagnosis not present

## 2017-09-02 DIAGNOSIS — I10 Essential (primary) hypertension: Secondary | ICD-10-CM

## 2017-09-02 DIAGNOSIS — E785 Hyperlipidemia, unspecified: Secondary | ICD-10-CM

## 2017-09-02 DIAGNOSIS — H8113 Benign paroxysmal vertigo, bilateral: Secondary | ICD-10-CM

## 2017-09-02 NOTE — Progress Notes (Signed)
[facility] [address] [place]. [place] [address] [phone]       OFFICE FOLLOW UP NOTE  [name] Date of Birth:  1956-02-10 Medical Record Number:  [id number]   Reason for Referral:  hospital follow up  CHIEF COMPLAINT:  Chief Complaint  Patient presents with  . Follow-up    Hospital Stroke follow up seen by Dr. Erlinda Hong room 9 pt is alone    HPI: Deanna Thompson is being seen today for initial visit in the office for possible small right brainstem stroke on 07/31/2017. History obtained from patient and chart review. Reviewed all radiology images and labs personally.  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 Patient does have a history of BPPV, which was short lasting. Patient came to ED with gradual onset of dizziness and then vertigo, nausea/vomiting, diaphoresis as well as binocular diplopia, lasting more than 4 hours and then gradually getting better. Upon admission, her examination benign. CT head reviewed and negative for acute abnormality. CTA head neck showed known left MCA 3 mm aneurysm at bifurcation and left nondominant VA V2 stenosis.  2D echo showed EF of 45-50% (recommended outpatient cardiology follow-up).  A1c 5.8 and LDL 129.  Repeat CT scan was performed 24 hours later and again showed no acute intracranial abnormality.  MRI unable to be obtained due to aneurysm clip.   As patient was not on antithrombotic prior to admission, recommended to start aspirin 81 mg daily.  As LDL was elevated, it was recommended to start Lipitor 40 mg daily.  Patient does have a history of a 3 mm aneurysm is being followed by Dr. Estanislado Pandy.   History of alcoholic cirrhosis and recommended to be carefully monitored by GI with initiation of Lipitor and aspirin.  During admission, LFTs currently within normal limits.  With symptoms of vertigo, diplopia, and ataxia it was believed that these symptoms were more related to a small brainstem central event as BPPV is not typically associated with diplopia or ataxia lasting for extended amount of time, and vestibular neuritis does not have diplopia but normally lasting longer than this. It was unclear if this event was related to her basilar artery pathology on top of previous coiling and clipping but as a DSA with Dr. Estanislado Pandy was an option, it would not necessarily change management at this time and as her clinical symptoms improved it was decided to hold off on cerebral angiogram at this time. Patient was discharged home in stable condition.   Patient is being seen today for hospital follow-up.  She has been continuing PT for vestibular rehab and has been seeing improvement in her dizziness.  Occasionally she will have increased dizziness with increased visual problems but typically sees this greater when she has increased fatigue.  She states up until yesterday she was about 60% improved and then felt as though she took a slight decrease in improvement.  She does go to our neuro rehab clinic 2 times per week.  Patient states that she feels fatigued frequently but is one that would rather try to push through her fatigue then rest at that time and does see worsening in symptoms.  She continues to take aspirin with mild bruising but no bleeding.  Continues  to take Lipitor without side effects myalgias.  Blood pressure today satisfactory 119/69.  Patient will be going for liver ultrasound next week and does have liver enzyme testing done every 3 to 6 months.  Patient will also be following up with cardiology on 09/16/2017 for repeat echocardiogram for low EF.  ROS:   14 system review of systems performed and  negative with exception of fatigue, spinning sensation, constipation, joint pain, aching muscles, allergies, memory loss, dizziness, depression, decreased energy, sleepiness  PMH:  Past Medical History:  Diagnosis Date  . Abdominal hernia   . Abscess of pulp of tooth 12/12/14   left side  . Anxiety   . Arthritis    back  and down flank  right side more than left  . B12 deficiency 06/12/2015  . Brain aneurysm    METAL COIL PREVENTS MRI IMAGING and CLIP  . Cirrhosis ([place])    Presumably alcoholic cirrhosis  . Complication of anesthesia   . Depression   . ETOH abuse    Quit in 07/2011   . Fatigue    due to Sjogren's disease  . Folate deficiency 06/30/2016  . GERD (gastroesophageal reflux disease)   . HTN (hypertension)    no longer medicated, dated on 04/25/12  . IBS (irritable bowel syndrome)   . Iron deficiency anemia due to chronic blood loss   . Myofascial pain   . Panic attack   . PONV (postoperative nausea and vomiting)   . Psoriatic arthritis ([place])   . Right inguinal hernia 08/16/2012  . Seizures ([place]) 1997   1 seizure from coil insertion but no more seizures and no meds for seizures  . Sjogren's disease ([place])   . [place] HEAD 02/03/2007   Qualifier: Diagnosis of  By: Aline Brochure MD, Dorothyann Peng    . 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(HCC)     PSH:  Past Surgical History:  Procedure Laterality Date  . AGILE CAPSULE N/A 06/21/2014   Procedure: AGILE CAPSULE;  Surgeon: Danie Binder, MD;  [facility];  Service: Endoscopy;  Laterality: N/A;  0700  . BIOPSY  10/13/2011   SLF: mild gastritis/duodenal polypoid lesion   . CEREBRAL ANEURYSM REPAIR  1997  . COLONOSCOPY  10/13/2011   FUX:NATFTDDU hemorrhoids/varices rectal/lesion in ascending colon(HYPERPLASTIC POLYP)  . COLONOSCOPY N/A 01/17/2013   SLF: 1. Internal hemorrhoids 2. Ascending colon polyps 3.  Rectal varices.   . COLONOSCOPY N/A 08/10/2016   Dr. Oneida Alar: redundant left colon. external and  internal hemorhroids. 5-10 year surveillance  . ESOPHAGOGASTRODUODENOSCOPY N/A 06/29/2014   SLF: 1. anemia due to polypoid lesions in teh antrum, and duodenal polyps 2. single duodenum AVM  . FLEXIBLE SIGMOIDOSCOPY N/A 01/21/2016   Procedure: FLEXIBLE SIGMOIDOSCOPY;  Surgeon: Danie Binder, MD;  [facility];  Service: Endoscopy;  Laterality: N/A;  245  . GIVENS CAPSULE STUDY N/A 06/29/2014   Procedure: GIVENS CAPSULE STUDY;  Surgeon: Danie Binder, MD;  [facility];  Service: Endoscopy;  Laterality: N/A;  . GIVENS CAPSULE STUDY N/A 07/13/2014   Procedure: GIVENS CAPSULE STUDY;  Surgeon: Danie Binder, MD;  [facility];  Service: Endoscopy;  Laterality: N/A;  700  . HEMORRHOID BANDING N/A 01/21/2016   Procedure: HEMORRHOID BANDING;  Surgeon: Danie Binder, MD;  [facility];  Service: Endoscopy;  Laterality: N/A;  . INGUINAL HERNIA REPAIR Right 08/28/2013   Procedure: RIGHT NGUINAL HERNIORRHAPHY WITH MESH;  Surgeon: Jamesetta So, MD;  Location: [facility];  Service: General;  Laterality: Right;  . INSERTION OF MESH N/A 06/09/2013   Procedure: INSERTION OF MESH;  Surgeon: Jamesetta So, MD;  Location: [facility];  Service: General;  Laterality: N/A;  . INSERTION OF MESH Right 08/28/2013   Procedure: INSERTION OF MESH;  Surgeon: Jamesetta So, MD;  Location: [facility];  Service: General;  Laterality: Right;  . PARACENTESIS  Feb 2014   10.7 liters  . TOOTH EXTRACTION Left 12/12/14  . UMBILICAL HERNIA REPAIR N/A 06/09/2013   Procedure: UMBILICAL HERNIORRHAPHY WITH MESH;  Surgeon: Jamesetta So, MD;  Location: [facility];  Service: General;  Laterality: N/A;    Social History:  Social History   Socioeconomic History  . Marital status: Married    Spouse name: Not on file  . Number of children: 0  . Years of education: Not on file  . Highest education level: Not on file  Occupational History    Employer: RETIRED  Social Needs  . Financial resource strain: Not  on file  . Food insecurity:    Worry: Not on file    Inability: Not on file  . Transportation needs:    Medical: Not on file    Non-medical: Not on file  Tobacco Use  . Smoking status: Former Smoker    Packs/day: 2.00    Years: 12.00    Pack years: 24.00    Types: Cigarettes    Last attempt to quit: 03/31/1995    Years since quitting: 22.4  . Smokeless tobacco: Never Used  . Tobacco comment: quit in 1997  Substance and Sexual Activity  . Alcohol use: No    Comment: Bottle of wine a day for years, but quit in 07/2011  . Drug use: No  . Sexual activity: Not Currently    Birth control/protection: Post-menopausal  Lifestyle  . Physical activity:    Days per week: Not on file    Minutes per session: Not on file  . Stress: Not on file  Relationships  . Social connections:    Talks on phone: Not on file    Gets together: Not on file    Attends religious service: Not on file    Active member of club or organization: Not on file    Attends meetings of clubs or organizations: Not on file    Relationship status: Not on file  . Intimate partner violence:    Fear of current or ex partner: Not on file    Emotionally abused: Not on file    Physically abused: Not on file    Forced sexual activity: Not on file  Other Topics Concern  . Not on file  Social History Narrative   Quit drinking alcohol 3 YEARS ago. No drugs, no IVDU.     Family History:  Family History  Problem Relation Age of Onset  . Cirrhosis Mother        nash  . COPD Father   . Heart disease Father   . Asthma Unknown   . Heart disease Unknown   . Colon cancer Neg Hx   . Inflammatory bowel disease Neg Hx     Medications:   Current Outpatient Medications on File Prior to Visit  Medication Sig Dispense Refill  . aspirin EC 81 MG EC tablet Take 1 tablet (81 mg total) by mouth daily. 30 tablet 0  . atorvastatin (LIPITOR) 40 MG tablet Take 1 tablet (40 mg total) by mouth daily. 30 tablet 0  . bumetanide (  BUMEX) 1  MG tablet TAKE (1) TABLET BY MOUTH (2) TIMES DAILY. (Patient taking differently: Take 1 mg by mouth 2 (two) times daily. ) 180 tablet 3  . buPROPion (WELLBUTRIN SR) 100 MG 12 hr tablet Take 1 tablet (100 mg total) by mouth 2 (two) times daily. 60 tablet 5  . docusate sodium (COLACE) 100 MG capsule Take 100 mg by mouth 2 (two) times daily.     Marland Kitchen escitalopram (LEXAPRO) 20 MG tablet TAKE (1) TABLET BY MOUTH AT BEDTIME. (Patient taking differently: Take 20 mg by mouth at bedtime. ) 30 tablet 5  . fluticasone (FLONASE) 50 MCG/ACT nasal spray Place 1 spray into both nostrils See admin instructions. Instill 1 spray into each nostril every morning, may also use later in the day as needed for sinus congestion    . hydrocortisone cream 1 % Apply 1 application topically 2 (two) times daily as needed (eczema in ears).    . linaclotide (LINZESS) 145 MCG CAPS capsule Take 1 capsule (145 mcg total) by mouth daily before breakfast. 30 capsule 11  . loratadine (CLARITIN) 10 MG tablet Take 10 mg by mouth daily.    Marland Kitchen losartan (COZAAR) 25 MG tablet TAKE (1) TABLET BY MOUTH ONCE DAILY. 30 tablet 0  . meclizine (ANTIVERT) 25 MG tablet Take 1 tablet (25 mg total) by mouth 3 (three) times daily as needed for dizziness. 20 tablet 0  . Multiple Vitamin (MULTIVITAMIN WITH MINERALS) TABS tablet Take 1 tablet by mouth daily.    . ondansetron (ZOFRAN) 4 MG tablet Take 1 tablet (4 mg total) by mouth every 8 (eight) hours as needed for nausea or vomiting. 12 tablet 0  . Polyvinyl Alcohol-Povidone (REFRESH OP) Place 1 drop into both eyes 3 (three) times daily as needed (dry eyes).    . ranitidine (ZANTAC) 150 MG tablet TAKE (1) TABLET BY MOUTH TWICE DAILY. 180 tablet 5  . Simethicone (GAS-X PO) Take 1 tablet by mouth 2 (two) times daily as needed (bloating/flatulence).    . sodium chloride (OCEAN) 0.65 % nasal spray Place 1 spray into the nose 2 (two) times daily as needed for congestion.     Marland Kitchen spironolactone (ALDACTONE) 100 MG  tablet TAKE (1) TABLET BY MOUTH TWICE DAILY. 180 tablet 5   No current facility-administered medications on file prior to visit.     Allergies:   Allergies  Allergen Reactions  . Omeprazole Shortness Of Breath  . Other Shortness Of Breath and Rash    Red Meat Causes shortness of breath, rash and flu like symptoms.   . Phenytoin Rash  . Keflex [Cephalexin] Other (See Comments)    Severe yeast infection  . Phenergan [Promethazine Hcl] Other (See Comments)    Not in right state of mind, HALLUCINATION     Physical Exam  Vitals:   09/02/17 0927  BP: 119/69  Pulse: 73  Weight: 229 lb (103.9 kg)  Height: 5\' 8"  (1.727 m)   Body mass index is 34.82 kg/m. No exam data present  General: well developed, pleasant middle-aged Caucasian female, well nourished, seated, in no evident distress Head: head normocephalic and atraumatic.   Neck: supple with no carotid or supraclavicular bruits Cardiovascular: regular rate and rhythm, no murmurs Musculoskeletal: no deformity Skin:  no rash/petichiae Vascular:  Normal pulses all extremities  Neurologic Exam Mental Status: Awake and fully alert.  Chronic mild expressive aphasia intermittently.  Oriented to place and time. Recent and remote memory worsening intermittently.  Attention span, concentration and fund  of knowledge appropriate. Mood and affect appropriate.  Cranial Nerves: Fundoscopic exam reveals sharp disc margins. Pupils equal, briskly reactive to light. Extraocular movements full without nystagmus. Visual fields full to confrontation. Hearing intact. Facial sensation intact. Face, tongue, palate moves normally and symmetrically.  Motor: Normal bulk and tone. Normal strength in all tested extremity muscles. Sensory.: intact to touch , pinprick , position and vibratory sensation.  Coordination: Rapid alternating movements normal in all extremities. Finger-to-nose and heel-to-shin performed accurately bilaterally. Gait and Station:  Arises from chair without difficulty. Stance is normal. Gait demonstrates normal stride length and balance .  Mild difficulty with tandem gait.  Negative Romberg. Reflexes: 1+ and symmetric. Toes downgoing.    NIHSS  0 Modified Rankin  1  Diagnostic Data (Labs, Imaging, Testing)   CT HEAD WO CONTRAST 07/31/2017 IMPRESSION: Head CT negative for acute intracranial abnormality.  CT ANGIO HEAD W OR WO CONTRAST CT ANGIO NECK W OR WO CONTRAST 07/31/2017 IMPRESSION: 1. Negative CTA for large vessel occlusion. 2. Treated basilar apex aneurysm. No remnant or CTA evidence for recanalization identified. 3. 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 4. Single short-segment severe distal left V2 stenosis. Vertebral arteries otherwise widely patent. Right vertebral artery dominant. 5. Atherosclerotic change about the carotid bifurcations and within the carotid siphons without high-grade stenosis.  CT HEAD WO CONTRAST (REPEAT) 08/02/2017 IMPRESSION: 1. No acute intracranial abnormality identified. 2. Stable chronic postsurgical changes related to right pterional craniotomy and subjacent encephalomalacia. Basilar aneurysm clip noted.  ECHOCARDIOGRAM 08/01/2017 Study Conclusions - Left ventricle: The cavity size was normal. There was mild   concentric hypertrophy. Systolic function was mildly reduced. The   estimated ejection fraction was in the range of 45% to 50%.   Diffuse hypokinesis. Doppler parameters are consistent with   abnormal left ventricular relaxation (grade 1 diastolic   dysfunction). - Aortic valve: Transvalvular velocity was increased, due to   stenosis. There was very mild stenosis. There was trivial   regurgitation. - Mitral valve: There was mild regurgitation. - Left atrium: The atrium was moderately to severely dilated. - Right atrium: The atrium was mildly dilated.       ASSESSMENT: Deanna Thompson is a 62 y.o. year old female here with possible right  brainstem infarct on 07/31/2017. Vascular risk factors include HTN and HLD.     PLAN: -Continue aspirin 81 mg daily  and Lipitor for secondary stroke prevention -f/u on 09/16/17 with Dr. Doylene Canard for cardiology   -obtain ultrasound on 09/07/17 for f/u on liver cirrhosis  -Continue physical therapy and advised patient that she needs additional orders she can call us -Continue to stay active and eat a healthy diet but also advised patient that if she starts feeling fatigued, she does need to rest at this time to prevent worsening of symptoms.  His increased fatigue should improve over time. -F/u with PCP regarding your HLD and HTN management -continue to monitor BP at home -Maintain strict control of hypertension with blood pressure goal below 130/90, diabetes with hemoglobin A1c goal below 6.5% and cholesterol with LDL cholesterol (bad cholesterol) goal below 70 mg/dL. I also advised the patient to eat a healthy diet with plenty of whole grains, cereals, fruits and vegetables, exercise regularly and maintain ideal body weight.  Follow up in 4 months or call earlier if needed   Greater than 50% of time during this 25 minute visit was spent on counseling,explanation of diagnosis of possible brainstem infarct, reviewing risk factor management of HLD and HTN,  planning of further management, discussion with patient and family and coordination of care  Venancio Poisson, [facility]  [facility] [address] [place], [place] [address]  Phone [phone] Fax [phone]

## 2017-09-02 NOTE — Patient Instructions (Signed)
Continue aspirin 81 mg daily  and lipitor  for secondary stroke prevention  Continue to follow up with PCP regarding cholesterol and blood pressure management   Follow up with cardiology on June 20th  Continue therapies - if you need additional orders to continue therapies please let us know!  Continue to monitor blood pressure at home  Maintain strict control of hypertension with blood pressure goal below 130/90, diabetes with hemoglobin A1c goal below 6.5% and cholesterol with LDL cholesterol (bad cholesterol) goal below 70 mg/dL. I also advised the patient to eat a healthy diet with plenty of whole grains, cereals, fruits and vegetables, exercise regularly and maintain ideal body weight.  Followup in the future with me in 4 months or call earlier if needed       Thank you for coming to see [place] at [facility]. I hope we have been able to provide you high quality care today.  You may receive a patient satisfaction survey over the next few weeks. We would appreciate your feedback and comments so that we may continue to improve ourselves and the health of our patients.

## 2017-09-03 ENCOUNTER — Encounter: Payer: Self-pay | Admitting: Physical Therapy

## 2017-09-03 ENCOUNTER — Ambulatory Visit: Payer: Commercial Managed Care - PPO | Admitting: Physical Therapy

## 2017-09-03 DIAGNOSIS — R262 Difficulty in walking, not elsewhere classified: Secondary | ICD-10-CM

## 2017-09-03 DIAGNOSIS — R42 Dizziness and giddiness: Secondary | ICD-10-CM | POA: Diagnosis not present

## 2017-09-03 DIAGNOSIS — R29818 Other symptoms and signs involving the nervous system: Secondary | ICD-10-CM

## 2017-09-03 NOTE — Progress Notes (Signed)
I agree with the above plan 

## 2017-09-03 NOTE — Therapy (Signed)
[facility] [address] [place], [place], [address] Phone: [phone]   Fax:  [phone]  Physical Therapy Treatment  Patient Details  Name: Deanna Thompson MRN: [id number] Date of Birth: 07-02-1955 Referring Provider: Vance Gather [facility]); Rosalin Hawking [facility] CVA team); Antony Contras ([facility]NA Stroke MD)   Encounter Date: 09/03/2017  PT End of Session - 09/03/17 1104    Visit Number  7    Number of Visits  13    Date for PT Re-Evaluation  09/21/17    Authorization Type  UHC, UMR    Authorization Time Period  NA    Authorization - Visit Number  7    Authorization - Number of Visits  30 pt, ot, slp    PT Start Time  1015    PT Stop Time  1100    PT Time Calculation (min)  45 min    Activity Tolerance  Patient tolerated treatment well    Behavior During Therapy  WFL for tasks assessed/performed       Past Medical History:  Diagnosis Date  . Abdominal hernia   . Abscess of pulp of tooth 12/12/14   left side  . Anxiety   . Arthritis    back  and down flank  right side more than left  . B12 deficiency 06/12/2015  . Brain aneurysm    METAL COIL PREVENTS MRI IMAGING and CLIP  . Cirrhosis ([place])    Presumably alcoholic cirrhosis  . Complication of anesthesia   . Depression   . ETOH abuse    Quit in 07/2011   . Fatigue    due to Sjogren's disease  . Folate deficiency 06/30/2016  . GERD (gastroesophageal reflux disease)   . HTN (hypertension)    no longer medicated, dated on 04/25/12  . IBS (irritable bowel syndrome)   . Iron deficiency anemia due to chronic blood loss   . Myofascial pain   . Panic attack   . PONV (postoperative nausea and vomiting)   . Psoriatic arthritis ([place])   . Right inguinal hernia 08/16/2012  . Seizures ([place]) 1997   1 seizure from coil insertion but no more seizures and no meds for seizures  . Sjogren's disease ([place])   . [place] HEAD 02/03/2007   Qualifier: Diagnosis of   By: Aline Brochure MD, Dorothyann Peng    . 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([place])     Past Surgical History:  Procedure Laterality Date  . AGILE CAPSULE N/A 06/21/2014   Procedure: AGILE CAPSULE;  Surgeon: Danie Binder, MD;  [facility];  Service: Endoscopy;  Laterality: N/A;  0700  . BIOPSY  10/13/2011   SLF: mild gastritis/duodenal polypoid lesion   . CEREBRAL ANEURYSM REPAIR  1997  . COLONOSCOPY  10/13/2011   PFX:[id number] hemorrhoids/varices rectal/lesion in ascending colon(HYPERPLASTIC POLYP)  . COLONOSCOPY N/A 01/17/2013   SLF: 1. Internal hemorrhoids 2. Ascending colon polyps 3.  Rectal varices.   . COLONOSCOPY N/A 08/10/2016   Dr. Oneida Alar: redundant left colon. external and internal hemorhroids. 5-10 year surveillance  . ESOPHAGOGASTRODUODENOSCOPY N/A 06/29/2014   SLF: 1. anemia due to polypoid lesions in teh antrum, and duodenal polyps 2. single duodenum AVM  . FLEXIBLE SIGMOIDOSCOPY N/A 01/21/2016   Procedure: FLEXIBLE SIGMOIDOSCOPY;  Surgeon: Danie Binder, MD;  Location: [facility];  Service: Endoscopy;  Laterality: N/A;  245  . GIVENS CAPSULE STUDY N/A 06/29/2014   Procedure: GIVENS CAPSULE STUDY;  Surgeon: Danie Binder, MD;  Location: [facility]  END[facility] SU[facility];  Service: Endoscopy;  Laterality: N/A;  . GIVENS CAPSULE STUDY N/A 07/13/2014   Procedure: GIVENS CAPSULE STUDY;  Surgeon: Danie Binder, MD;  Location: [facility];  Service: Endoscopy;  Laterality: N/A;  700  . HEMORRHOID BANDING N/A 01/21/2016   Procedure: HEMORRHOID BANDING;  Surgeon: Danie Binder, MD;  Location: [facility];  Service: Endoscopy;  Laterality: N/A;  . INGUINAL HERNIA REPAIR Right 08/28/2013   Procedure: RIGHT NGUINAL HERNIORRHAPHY WITH MESH;  Surgeon: Jamesetta So, MD;  Location: [facility] ORS;  Service: General;  Laterality: Right;  . INSERTION OF MESH N/A 06/09/2013   Procedure: INSERTION OF MESH;  Surgeon: Jamesetta So, MD;  Location: [facility] ORS;  Service: General;  Laterality: N/A;  . INSERTION OF  MESH Right 08/28/2013   Procedure: INSERTION OF MESH;  Surgeon: Jamesetta So, MD;  Location: [facility] ORS;  Service: General;  Laterality: Right;  . PARACENTESIS  Feb 2014   10.7 liters  . TOOTH EXTRACTION Left 12/12/14  . UMBILICAL HERNIA REPAIR N/A 06/09/2013   Procedure: UMBILICAL HERNIORRHAPHY WITH MESH;  Surgeon: Jamesetta So, MD;  Location: [facility] ORS;  Service: General;  Laterality: N/A;    There were no vitals filed for this visit.  Subjective Assessment - 09/03/17 1017    Subjective  Wednesday felt like she went a 1/2 step backward; it felt like all my symptoms increased a bit.     Patient is accompained by:  Family member    Pertinent History  HTN, ETOH abuse, cirrhosis, brain aneurysm, Sjogren's disease, basilar aneurysm clip and coiling, Lt MCA aneurysm    Diagnostic tests  CT scans x 2 (cannot do MRI becasue of the clips & coils);     Patient Stated Goals  stop having dizziness and get back to walking without LOB    Currently in Pain?  Yes    Pain Score  3     Pain Location  Hip    Pain Orientation  Right    Pain Descriptors / Indicators  Aching    Pain Type  Chronic pain    Pain Radiating Towards  hip to lateral thigh    Pain Onset  More than a month ago    Pain Frequency  Intermittent    Aggravating Factors   standing        Treatment-  Reassessed HEP exercises--VORx1 in standing for up to 30 seconds (with pt turning head in middle field of vision where she does not have double-vision; and without chin tuck to avoid using most superior field of vision where she also sees double vision. Standing with staggered stance on 2 pillows with back to the corner EO with head turns horizontal and vertical; EC for up to 17 seconds with unsteady sway the entire time. (pt has trifocals and is going to see her eye doctor today).   Ambulation- 120 ft x 3 breaks (allowed symptoms to reverse before proceeding) including tossing ball to eye level, head turns. Patient reported feeling better  while moving and symptomatic when stopped moving  Neuro re-ed-seated on physioball (to work on balance with less strain on her arches of feet); pt without incr symptoms with gentle bouncing while focused on static target, trunk rotation; incr difficulty maintaining balance with alternating knee extension (esp difficult with need for assist to prevent fall when left foot stabilizing and rt foot lifted; marching; alternating shoulder flexion  PT Education - 09/03/17 1104    Education Details  as foot/arch, hip pain allow begin daily walking program; recent significant incr rt hip pain may be due to favoring LLE     Person(s) Educated  Patient    Methods  Explanation    Comprehension  Verbalized understanding       PT Short Term Goals - 08/30/17 1027      PT SHORT TERM GOAL #1   Title  Patient will have negative positional testing for BPPV. (Target all STGs 08/31/2017)    Time  3    Period  Weeks    Status  Achieved      PT SHORT TERM GOAL #2   Title  Patient will complete balance assessments (DGI vs FGA vs Berg) and set LTGs as appropriate.     Baseline  08/11/17 FGA completed    Time  3    Period  Weeks    Status  Achieved      PT SHORT TERM GOAL #3   Title  Patient will be independent with basic HEP to address vestibular deficits.     Time  3    Period  Weeks    Status  Achieved        PT Long Term Goals - 08/11/17 1558      PT LONG TERM GOAL #1   Title  Patient will be independent with updated HEP and able to verbalize her plan for continued community-based activity upon d/c from PT. (Target for all LTGs 09/21/2017)    Time  6    Period  Weeks    Status  New      PT LONG TERM GOAL #2   Title  Patient will ambulate modified independently (incr time) over level indoor and outdoor surfaces up to 400 ft with no assistive device and utilizing compensatory strategies as indicated.     Time  6    Period  Weeks    Status  New      PT  LONG TERM GOAL #3   Title  Patient will be able to verbalize warning signs of CVA (including posterior circulation CVAs) and appropriate steps to take if CVA suspected.     Time  6    Period  Weeks    Status  New      PT LONG TERM GOAL #4   Title  FOTO functional status score will improve to >=65/100 indicating improved overall functional abilities.     Time  6    Period  Weeks    Status  New      PT LONG TERM GOAL #5   Title  Patient will improve FGA >=24/30 to indicate lesser fall risk and clinically significant improvement in balance.     Time  6    Period  Weeks    Status  New            Plan - 09/03/17 1105    Clinical Impression Statement  Patient did well during session with less vision/vestibular symptoms with increasing complexity of activities. By end of session she reported feeling better than when she arrived!     Rehab Potential  Good    Clinical Impairments Affecting Rehab Potential  anxiety, mixed peripheral and central vestibular deficits    PT Frequency  2x / week    PT Duration  6 weeks    PT Treatment/Interventions  ADLs/Self Care Home Management;Canalith Repostioning;Gait training;DME Instruction;Stair training;Functional mobility training;Therapeutic activities;Therapeutic exercise;Balance  training;Neuromuscular re-education;Cognitive remediation;Patient/family education;Passive range of motion;Vestibular;Visual/perceptual remediation/compensation    PT Next Visit Plan  work on balance training (compliant surfaces -as her feet/arches allow) and EC; gait with head turns, turns, dual task; add balance exs to HEP (?SLS, tandem walk fwd/bckwd)    PT Home Exercise Plan  VORx1; corner on pillow, feet partial tandem, EO head turns, EC head neutral    Consulted and Agree with Plan of Care  Patient    Family Member Consulted  --       Patient will benefit from skilled therapeutic intervention in order to improve the following deficits and impairments:  Abnormal  gait, Decreased activity tolerance, Decreased balance, Decreased cognition, Decreased mobility, Decreased knowledge of use of DME, Dizziness, Impaired vision/preception  Visit Diagnosis: Dizziness and giddiness  Difficulty in walking, not elsewhere classified  Other symptoms and signs involving the nervous system     Problem List Patient Active Problem List   Diagnosis Date Noted  . Benign paroxysmal positional vertigo   . 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 Essential hypertension 07/31/2017  . GERD (gastroesophageal reflux disease) 07/31/2017  . Alcohol abuse 07/31/2017  . Brain aneurysm 07/31/2017  . CKD (chronic kidney disease), stage III ([place]) 07/31/2017  . LBBB (left bundle branch block) 07/31/2017  . Dizziness 07/31/2017  . Nausea & vomiting 07/31/2017  . Folate deficiency 06/30/2016  . Constipation 04/01/2016  . 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 Hemorrhoids, internal, with bleeding 01/08/2016  . B12 deficiency 06/12/2015  . Psoriatic arthritis ([place]) 04/15/2015  . Nutritional anemia 09/19/2014  . Iron deficiency anemia due to chronic blood loss   . Insomnia 03/17/2014  . Depression 02/18/2014  . Labial swelling 08/23/2012  . Rash and nonspecific skin eruption 06/30/2012  . Colon polyps 11/18/2011  . Cirrhosis ([place]) 08/19/2011    Rexanne Mano, PT 09/04/2017, 6:15 AM  [facility] [address] [place], [place], [address] Phone: [phone]   Fax:  [phone]  Name: Deanna Thompson MRN: [id number] Date of Birth: December 10, 1955

## 2017-09-06 ENCOUNTER — Encounter: Payer: Self-pay | Admitting: Physical Therapy

## 2017-09-06 ENCOUNTER — Ambulatory Visit: Payer: Commercial Managed Care - PPO | Admitting: Physical Therapy

## 2017-09-06 ENCOUNTER — Other Ambulatory Visit: Payer: Self-pay

## 2017-09-06 DIAGNOSIS — R262 Difficulty in walking, not elsewhere classified: Secondary | ICD-10-CM

## 2017-09-06 DIAGNOSIS — R29818 Other symptoms and signs involving the nervous system: Secondary | ICD-10-CM

## 2017-09-06 DIAGNOSIS — R42 Dizziness and giddiness: Secondary | ICD-10-CM

## 2017-09-06 NOTE — Patient Instructions (Signed)
Access Code: [email or web address]  URL: [email or web address]  Date: 09/06/2017  Prepared by: Barry Brunner   Exercises  Forward Walking with Diagonal Head Turns - 1x daily - 7x weekly    Eye Tracking: Horizontal / Vertical    Use your letter E to direct eye movements. Be sure both eyes respond. Do not let head move. Horizontal: Slowly move card to left as far as possible until it doubles (or as far as you can). Hold ___5 seconds. Then look to the right. Hold __5__ seconds. Vertical: Slowly look up as far as possible. Hold __5__ seconds. Then look down. Hold ___5_ seconds. Repeat _10___ times. Do __1__ sessions per day.  Copyright  VHI. All rights reserved.

## 2017-09-06 NOTE — Therapy (Signed)
[facility] [address] [place], [place], [address] Phone: [phone]   Fax:  [phone]  Physical Therapy Treatment  Patient Details  Name: Deanna Thompson MRN: [id number] Date of Birth: [birth year] Referring Provider: Vance Gather [facility]); Rosalin Hawking [facility] CVA team); Antony Contras (GNA Stroke MD)   Encounter Date: 09/06/2017  PT End of Session - 09/06/17 1118    Visit Number  8    Number of Visits  13    Date for PT Re-Evaluation  09/21/17    Authorization Type  UHC, UMR    Authorization Time Period  NA    Authorization - Visit Number  7    Authorization - Number of Visits  30 pt, ot, slp    PT Start Time  1020    PT Stop Time  1105    PT Time Calculation (min)  45 min    Activity Tolerance  Patient tolerated treatment well    Behavior During Therapy  WFL for tasks assessed/performed       Past Medical History:  Diagnosis Date  . Abdominal hernia   . Abscess of pulp of tooth 12/12/14   left side  . Anxiety   . Arthritis    back  and down flank  right side more than left  . B12 deficiency 06/12/2015  . Brain aneurysm    METAL COIL PREVENTS MRI IMAGING and CLIP  . Cirrhosis ([place])    Presumably alcoholic cirrhosis  . Complication of anesthesia   . Depression   . ETOH abuse    Quit in 07/2011   . Fatigue    due to Sjogren's disease  . Folate deficiency 06/30/2016  . GERD (gastroesophageal reflux disease)   . HTN (hypertension)    no longer medicated, dated on 04/25/12  . IBS (irritable bowel syndrome)   . Iron deficiency anemia due to chronic blood loss   . Myofascial pain   . Panic attack   . PONV (postoperative nausea and vomiting)   . Psoriatic arthritis ([place])   . Right inguinal hernia 08/16/2012  . Seizures ([place]) 1997   1 seizure from coil insertion but no more seizures and no meds for seizures  . Sjogren's disease ([place])   . [place] HEAD 02/03/2007   Qualifier: Diagnosis of   By: Aline Brochure MD, Dorothyann Peng    . 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([place])     Past Surgical History:  Procedure Laterality Date  . AGILE CAPSULE N/A 06/21/2014   Procedure: AGILE CAPSULE;  Surgeon: Danie Binder, MD;  [facility];  Service: Endoscopy;  Laterality: N/A;  0700  . BIOPSY  10/13/2011   SLF: mild gastritis/duodenal polypoid lesion   . CEREBRAL ANEURYSM REPAIR  1997  . COLONOSCOPY  10/13/2011   HER:DEYCXKGY hemorrhoids/varices rectal/lesion in ascending colon(HYPERPLASTIC POLYP)  . COLONOSCOPY N/A 01/17/2013   SLF: 1. Internal hemorrhoids 2. Ascending colon polyps 3.  Rectal varices.   . COLONOSCOPY N/A 08/10/2016   Dr. Oneida Alar: redundant left colon. external and internal hemorhroids. 5-10 year surveillance  . ESOPHAGOGASTRODUODENOSCOPY N/A 06/29/2014   SLF: 1. anemia due to polypoid lesions in teh antrum, and duodenal polyps 2. single duodenum AVM  . FLEXIBLE SIGMOIDOSCOPY N/A 01/21/2016   Procedure: FLEXIBLE SIGMOIDOSCOPY;  Surgeon: Danie Binder, MD;  [facility];  Service: Endoscopy;  Laterality: N/A;  245  . GIVENS CAPSULE STUDY N/A 06/29/2014   Procedure: GIVENS CAPSULE STUDY;  Surgeon: Danie Binder, MD;  Location[facility];  Service: Endoscopy;  Laterality: N/A;  . GIVENS CAPSULE STUDY N/A 07/13/2014   Procedure: GIVENS CAPSULE STUDY;  Surgeon: Danie Binder, MD;  Location[facility];  Service: Endoscopy;  Laterality: N/A;  700  . HEMORRHOID BANDING N/A 01/21/2016   Procedure: HEMORRHOID BANDING;  Surgeon: Danie Binder, MD;  Location[facility];  Service: Endoscopy;  Laterality: N/A;  . INGUINAL HERNIA REPAIR Right 08/28/2013   Procedure: RIGHT NGUINAL HERNIORRHAPHY WITH MESH;  Surgeon: Jamesetta So, MD;  Location: [facility] ORS;  Service: General;  Laterality: Right;  . INSERTION OF MESH N/A 06/09/2013   Procedure: INSERTION OF MESH;  Surgeon: Jamesetta So, MD;  Location: [facility] ORS;  Service: General;  Laterality: N/A;  . INSERTION OF  MESH Right 08/28/2013   Procedure: INSERTION OF MESH;  Surgeon: Jamesetta So, MD;  Location: [facility] ORS;  Service: General;  Laterality: Right;  . PARACENTESIS  Feb 2014   10.7 liters  . TOOTH EXTRACTION Left 12/12/14  . UMBILICAL HERNIA REPAIR N/A 06/09/2013   Procedure: UMBILICAL HERNIORRHAPHY WITH MESH;  Surgeon: Jamesetta So, MD;  Location: [facility] ORS;  Service: General;  Laterality: N/A;    There were no vitals filed for this visit.  Subjective Assessment - 09/06/17 1024    Subjective  More tired today. Saw the eye doctor and prescription has not changed much--not worth buying new glasses. Still having double-vision if she drops her chin to do VORx1. She's having problems looking at her computer screen scroll up or down (makes her feel bad)    Patient is accompained by:  Family member    Pertinent History  HTN, ETOH abuse, cirrhosis, brain aneurysm, Sjogren's disease, basilar aneurysm clip and coiling, Lt MCA aneurysm    Diagnostic tests  CT scans x 2 (cannot do MRI becasue of the clips & coils);     Patient Stated Goals  stop having dizziness and get back to walking without LOB    Currently in Pain?  No/denies    Pain Onset  More than a month ago                        Vestibular Treatment/Exercise - 09/06/17 1115      Vestibular Treatment/Exercise   Gaze Exercises  Eye/Head Exercise Horizontal;Eye/Head Exercise Vertical      Eye/Head Exercise Horizontal   Foot Position  seated    Reps  5    Comments  tracking "E" until it appears double (pt not currntly expericencing      Eye/Head Exercise Vertical   Foot Position  seated    Reps  5    Comments  tracking "E" until sees double         Balance Exercises - 09/06/17 1114      Balance Exercises: Standing   Rockerboard  Anterior/posterior;Head turns;EO;Intermittent UE support step on/off fwd/bkwd each LE; low kicks    Gait with Head Turns  Forward tossing ball lt to rt hand; atttempted naming task          PT Education - 09/06/17 1117    Education Details  added horizontal and vertical eye tracking and walking with diagonal head turns    Person(s) Educated  Patient    Methods  Explanation;Demonstration;Verbal cues;Handout    Comprehension  Verbalized understanding;Returned demonstration;Need further instruction       PT Short Term Goals - 08/30/17 1027      PT SHORT TERM GOAL #  1   Title  Patient will have negative positional testing for BPPV. (Target all STGs 08/31/2017)    Time  3    Period  Weeks    Status  Achieved      PT SHORT TERM GOAL #2   Title  Patient will complete balance assessments (DGI vs FGA vs Berg) and set LTGs as appropriate.     Baseline  08/11/17 FGA completed    Time  3    Period  Weeks    Status  Achieved      PT SHORT TERM GOAL #3   Title  Patient will be independent with basic HEP to address vestibular deficits.     Time  3    Period  Weeks    Status  Achieved        PT Long Term Goals - 08/11/17 1558      PT LONG TERM GOAL #1   Title  Patient will be independent with updated HEP and able to verbalize her plan for continued community-based activity upon d/c from PT. (Target for all LTGs 09/21/2017)    Time  6    Period  Weeks    Status  New      PT LONG TERM GOAL #2   Title  Patient will ambulate modified independently (incr time) over level indoor and outdoor surfaces up to 400 ft with no assistive device and utilizing compensatory strategies as indicated.     Time  6    Period  Weeks    Status  New      PT LONG TERM GOAL #3   Title  Patient will be able to verbalize warning signs of CVA (including posterior circulation CVAs) and appropriate steps to take if CVA suspected.     Time  6    Period  Weeks    Status  New      PT LONG TERM GOAL #4   Title  FOTO functional status score will improve to >=65/100 indicating improved overall functional abilities.     Time  6    Period  Weeks    Status  New      PT LONG TERM GOAL #5   Title   Patient will improve FGA >=24/30 to indicate lesser fall risk and clinically significant improvement in balance.     Time  6    Period  Weeks    Status  New            Plan - 09/06/17 1119    Clinical Impression Statement  Patient is tolerating more complex vestibular and balance training activities. Areas in visual field where she sees double have significantly reduced and provided tracking exercise to address. Discussed ? need for OT consult vs neuro-optometrist. Will discuss with OT colleague and request order(s) from MD if indicated.     Rehab Potential  Good    Clinical Impairments Affecting Rehab Potential  anxiety, mixed peripheral and central vestibular deficits    PT Frequency  2x / week    PT Duration  6 weeks    PT Treatment/Interventions  ADLs/Self Care Home Management;Canalith Repostioning;Gait training;DME Instruction;Stair training;Functional mobility training;Therapeutic activities;Therapeutic exercise;Balance training;Neuromuscular re-education;Cognitive remediation;Patient/family education;Passive range of motion;Vestibular;Visual/perceptual remediation/compensation    PT Next Visit Plan  ? need to extend PT appts; ?OT consult: review current HEP (have added and subtracted several times); work on balance training (compliant surfaces -as her feet/arches allow) and EC; gait with head turns, turns, dual task; add balance  exs to HEP (?SLS, tandem walk fwd/bckwd)    PT Home Exercise Plan  VORx1; corner on pillow, feet partial tandem, EO head turns, EC head neutral; walking diagonal head turns; eye tracking horiz and vertical    Consulted and Agree with Plan of Care  Patient       Patient will benefit from skilled therapeutic intervention in order to improve the following deficits and impairments:  Abnormal gait, Decreased activity tolerance, Decreased balance, Decreased cognition, Decreased mobility, Decreased knowledge of use of DME, Dizziness, Impaired  vision/preception  Visit Diagnosis: Dizziness and giddiness  Difficulty in walking, not elsewhere classified  Other symptoms and signs involving the nervous system     Problem List Patient Active Problem List   Diagnosis Date Noted  . Benign paroxysmal positional vertigo   . 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 Essential hypertension 07/31/2017  . GERD (gastroesophageal reflux disease) 07/31/2017  . Alcohol abuse 07/31/2017  . Brain aneurysm 07/31/2017  . CKD (chronic kidney disease), stage III ([place]) 07/31/2017  . LBBB (left bundle branch block) 07/31/2017  . Dizziness 07/31/2017  . Nausea & vomiting 07/31/2017  . Folate deficiency 06/30/2016  . Constipation 04/01/2016  . 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 Hemorrhoids, internal, with bleeding 01/08/2016  . B12 deficiency 06/12/2015  . Psoriatic arthritis ([place]) 04/15/2015  . Nutritional anemia 09/19/2014  . Iron deficiency anemia due to chronic blood loss   . Insomnia 03/17/2014  . Depression 02/18/2014  . Labial swelling 08/23/2012  . Rash and nonspecific skin eruption 06/30/2012  . Colon polyps 11/18/2011  . Cirrhosis ([place]) 08/19/2011    Rexanne Mano, PT 09/06/2017, 11:23 AM  [facility] [address] [place], [place], [address] Phone: [phone]   Fax:  [phone]  Name: Deanna Thompson MRN: [id number] Date of Birth: 1955-10-18

## 2017-09-07 ENCOUNTER — Ambulatory Visit (HOSPITAL_COMMUNITY)
Admission: RE | Admit: 2017-09-07 | Discharge: 2017-09-07 | Disposition: A | Discharge status: Home / Self Care | Payer: Commercial Managed Care - PPO | Source: Ambulatory Visit | Attending: Gastroenterology | Admitting: Gastroenterology

## 2017-09-07 DIAGNOSIS — K8020 Calculus of gallbladder without cholecystitis without obstruction: Secondary | ICD-10-CM | POA: Insufficient documentation

## 2017-09-07 DIAGNOSIS — K7460 Unspecified cirrhosis of liver: Secondary | ICD-10-CM | POA: Diagnosis present

## 2017-09-08 NOTE — Progress Notes (Signed)
Deanna Thompson: your ultrasound shows stable changes of cirrhosis in your liver. You do have gallstones, but as long as you are not having any biliary type symptoms, we don't need to worry. How are you? Sent in [place].

## 2017-09-10 ENCOUNTER — Ambulatory Visit: Payer: Commercial Managed Care - PPO | Admitting: Physical Therapy

## 2017-09-10 ENCOUNTER — Encounter: Payer: Self-pay | Admitting: Physical Therapy

## 2017-09-10 DIAGNOSIS — R42 Dizziness and giddiness: Secondary | ICD-10-CM

## 2017-09-10 DIAGNOSIS — R29818 Other symptoms and signs involving the nervous system: Secondary | ICD-10-CM

## 2017-09-10 DIAGNOSIS — R262 Difficulty in walking, not elsewhere classified: Secondary | ICD-10-CM

## 2017-09-10 NOTE — Patient Instructions (Addendum)
Eye Tracking: Horizontal / Vertical    Use your letter E to direct eye movements. Be sure both eyes respond. Do not let head move. Horizontal: Cover one eye and slowly move card to left as far as possible until it doubles (or as far as you can). Hold ___5 seconds. Then look to the right. Hold __5__ seconds. Repeat with other eye covered, then with both eyes working together. Vertical: Cover one eye and slowly look up as far as possible. Hold __5__ seconds. Then look down. Hold ___5_ seconds.Repeat with other eye covered, then with both eyes working together. Repeat _3-5___ times. Do __1__ sessions per day.   Access Code: [email or web address]  URL: [email or web address]  Date: 09/10/2017  Prepared by: Barry Brunner   Exercises  Forward Walking with Diagonal Head Turns - 1x daily - 7x weekly  Tandem Walking with Counter Support - 10 reps - 1 sets - 1x daily - 5x weekly  Single Leg Stance with Support - 3 reps - 10 seocnds hold - 1x daily - 5x weekly

## 2017-09-10 NOTE — Therapy (Signed)
[facility] [address] [place], [place], [address] Phone: [phone]   Fax:  [phone]  Physical Therapy Treatment  Patient Details  Name: Deanna Thompson MRN: [id number] Date of Birth: [birth year] Referring Provider: Vance Gather [facility]); Rosalin Hawking [facility] CVA team); Antony Contras ([facility] Stroke MD)   Encounter Date: 09/10/2017  PT End of Session - 09/10/17 1924    Visit Number  9    Number of Visits  13    Date for PT Re-Evaluation  09/21/17    Authorization Type  UHC, UMR 30 VL    Authorization Time Period  NA    Authorization - Visit Number  8    Authorization - Number of Visits  30 pt, ot, slp    PT Start Time  1016    PT Stop Time  1102    PT Time Calculation (min)  46 min    Activity Tolerance  Patient tolerated treatment well    Behavior During Therapy  WFL for tasks assessed/performed       Past Medical History:  Diagnosis Date  . Abdominal hernia   . Abscess of pulp of tooth 12/12/14   left side  . Anxiety   . Arthritis    back  and down flank  right side more than left  . B12 deficiency 06/12/2015  . Brain aneurysm    METAL COIL PREVENTS MRI IMAGING and CLIP  . Cirrhosis ([place])    Presumably alcoholic cirrhosis  . Complication of anesthesia   . Depression   . ETOH abuse    Quit in 07/2011   . Fatigue    due to Sjogren's disease  . Folate deficiency 06/30/2016  . GERD (gastroesophageal reflux disease)   . HTN (hypertension)    no longer medicated, dated on 04/25/12  . IBS (irritable bowel syndrome)   . Iron deficiency anemia due to chronic blood loss   . Myofascial pain   . Panic attack   . PONV (postoperative nausea and vomiting)   . Psoriatic arthritis ([place])   . Right inguinal hernia 08/16/2012  . Seizures ([place]) 1997   1 seizure from coil insertion but no more seizures and no meds for seizures  . Sjogren's disease ([place])   . [place] HEAD 02/03/2007   Qualifier:  Diagnosis of  By: Aline Brochure MD, Dorothyann Peng    . 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([place])     Past Surgical History:  Procedure Laterality Date  . AGILE CAPSULE N/A 06/21/2014   Procedure: AGILE CAPSULE;  Surgeon: Danie Binder, MD;  [facility];  Service: Endoscopy;  Laterality: N/A;  0700  . BIOPSY  10/13/2011   SLF: mild gastritis/duodenal polypoid lesion   . CEREBRAL ANEURYSM REPAIR  1997  . COLONOSCOPY  10/13/2011   ONG:[id number] hemorrhoids/varices rectal/lesion in ascending colon(HYPERPLASTIC POLYP)  . COLONOSCOPY N/A 01/17/2013   SLF: 1. Internal hemorrhoids 2. Ascending colon polyps 3.  Rectal varices.   . COLONOSCOPY N/A 08/10/2016   Dr. Oneida Alar: redundant left colon. external and internal hemorhroids. 5-10 year surveillance  . ESOPHAGOGASTRODUODENOSCOPY N/A 06/29/2014   SLF: 1. anemia due to polypoid lesions in teh antrum, and duodenal polyps 2. single duodenum AVM  . FLEXIBLE SIGMOIDOSCOPY N/A 01/21/2016   Procedure: FLEXIBLE SIGMOIDOSCOPY;  Surgeon: Danie Binder, MD;  Location: [facility];  Service: Endoscopy;  Laterality: N/A;  245  . GIVENS CAPSULE STUDY N/A 06/29/2014   Procedure: GIVENS CAPSULE STUDY;  Surgeon: Danie Binder, MD;  [facility];  Service: Endoscopy;  Laterality: N/A;  . GIVENS CAPSULE STUDY N/A 07/13/2014   Procedure: GIVENS CAPSULE STUDY;  Surgeon: Danie Binder, MD;  [facility];  Service: Endoscopy;  Laterality: N/A;  700  . HEMORRHOID BANDING N/A 01/21/2016   Procedure: HEMORRHOID BANDING;  Surgeon: Danie Binder, MD;  [facility];  Service: Endoscopy;  Laterality: N/A;  . INGUINAL HERNIA REPAIR Right 08/28/2013   Procedure: RIGHT NGUINAL HERNIORRHAPHY WITH MESH;  Surgeon: Jamesetta So, MD;  Location: [facility] ORS;  Service: General;  Laterality: Right;  . INSERTION OF MESH N/A 06/09/2013   Procedure: INSERTION OF MESH;  Surgeon: Jamesetta So, MD;  Location: [facility] ORS;  Service: General;  Laterality: N/A;  .  INSERTION OF MESH Right 08/28/2013   Procedure: INSERTION OF MESH;  Surgeon: Jamesetta So, MD;  Location: [facility] ORS;  Service: General;  Laterality: Right;  . PARACENTESIS  Feb 2014   10.7 liters  . TOOTH EXTRACTION Left 12/12/14  . UMBILICAL HERNIA REPAIR N/A 06/09/2013   Procedure: UMBILICAL HERNIORRHAPHY WITH MESH;  Surgeon: Jamesetta So, MD;  Location: [facility] ORS;  Service: General;  Laterality: N/A;    There were no vitals filed for this visit.  Subjective Assessment - 09/10/17 1019    Subjective  Noticed when she sings in the car (she just started) it increases her "full feeling" in her head and increases by 2 points. Doing exercises the diagonal is difficult.     Patient is accompained by:  Family member    Pertinent History  HTN, ETOH abuse, cirrhosis, brain aneurysm, Sjogren's disease, basilar aneurysm clip and coiling, Lt MCA aneurysm    Diagnostic tests  CT scans x 2 (cannot do MRI becasue of the clips & coils);     Patient Stated Goals  stop having dizziness and get back to walking without LOB    Pain Onset  More than a month ago                        Vestibular Treatment/Exercise - 09/10/17 1917      Vestibular Treatment/Exercise   Vestibular Treatment Provided  Gaze    Gaze Exercises  Eye/Head Exercise Horizontal;Eye/Head Exercise Vertical      Eye/Head Exercise Horizontal   Foot Position  seated    Comments  cover one eye and track "E" left to right or up and down; cover other eye and repeat; uncover both and track with both      Eye/Head Exercise Vertical   Foot Position  seated    Comments  cover one eye and track "E" left to right or up and down; cover other eye and repeat; uncover both and track with both         Balance Exercises - 09/10/17 1919      Balance Exercises: Standing   Tandem Stance  Eyes open;Intermittent upper extremity support;3 reps;10 secs;20 secs bil; most difficult RLE behind    SLS  Eyes open;Solid surface;Upper extremity  support 2;2 reps;10 secs each leg    Tandem Gait  Forward;Upper extremity support;5 reps    Retro Gait  Upper extremity support;5 reps    Cone Rotation Limitations  slow march required UE support        PT Education - 09/10/17 1921    Education Details  added covering one eye and using one eye to track target, switch eyes, then use both to track  Person(s) Educated  Patient    Methods  Explanation;Demonstration;Verbal cues;Handout    Comprehension  Verbalized understanding;Returned demonstration;Verbal cues required;Need further instruction       PT Short Term Goals - 08/30/17 1027      PT SHORT TERM GOAL #1   Title  Patient will have negative positional testing for BPPV. (Target all STGs 08/31/2017)    Time  3    Period  Weeks    Status  Achieved      PT SHORT TERM GOAL #2   Title  Patient will complete balance assessments (DGI vs FGA vs Berg) and set LTGs as appropriate.     Baseline  08/11/17 FGA completed    Time  3    Period  Weeks    Status  Achieved      PT SHORT TERM GOAL #3   Title  Patient will be independent with basic HEP to address vestibular deficits.     Time  3    Period  Weeks    Status  Achieved        PT Long Term Goals - 08/11/17 1558      PT LONG TERM GOAL #1   Title  Patient will be independent with updated HEP and able to verbalize her plan for continued community-based activity upon d/c from PT. (Target for all LTGs 09/21/2017)    Time  6    Period  Weeks    Status  New      PT LONG TERM GOAL #2   Title  Patient will ambulate modified independently (incr time) over level indoor and outdoor surfaces up to 400 ft with no assistive device and utilizing compensatory strategies as indicated.     Time  6    Period  Weeks    Status  New      PT LONG TERM GOAL #3   Title  Patient will be able to verbalize warning signs of CVA (including posterior circulation CVAs) and appropriate steps to take if CVA suspected.     Time  6    Period  Weeks     Status  New      PT LONG TERM GOAL #4   Title  FOTO functional status score will improve to >=65/100 indicating improved overall functional abilities.     Time  6    Period  Weeks    Status  New      PT LONG TERM GOAL #5   Title  Patient will improve FGA >=24/30 to indicate lesser fall risk and clinically significant improvement in balance.     Time  6    Period  Weeks    Status  New            Plan - 09/10/17 1926    Clinical Impression Statement  Updated pt that I discussed her diplopia with an OT and they did not feel OT consult warranted (did suggest update to her tracking exercise) and because she is continuing to improve, would not recommend neuro-opthalmologist until 6 months after CVA. Session able to focus on dynamic balance activities after updating tracking exercise. Balance remains an issue and pt can continue to benefit.     Rehab Potential  Good    Clinical Impairments Affecting Rehab Potential  anxiety, mixed peripheral and central vestibular deficits    PT Frequency  2x / week    PT Duration  6 weeks    PT Treatment/Interventions  ADLs/Self Care Home Management;Canalith Repostioning;Gait  training;DME Instruction;Stair training;Functional mobility training;Therapeutic activities;Therapeutic exercise;Balance training;Neuromuscular re-education;Cognitive remediation;Patient/family education;Passive range of motion;Vestibular;Visual/perceptual remediation/compensation    PT Next Visit Plan  ?need to drop down to 1x/week (30 VL); review current HEP (have added and subtracted several times); work on balance training (compliant surfaces -as her feet/arches allow) and EC; gait with head turns, turns, dual task    PT Home Exercise Plan  VORx1; corner on pillow, feet partial tandem, EO head turns, EC head neutral; walking diagonal head turns; eye tracking horiz and vertical; tandem walk; backward walk; SLS    Consulted and Agree with Plan of Care  Patient       Patient will  benefit from skilled therapeutic intervention in order to improve the following deficits and impairments:  Abnormal gait, Decreased activity tolerance, Decreased balance, Decreased cognition, Decreased mobility, Decreased knowledge of use of DME, Dizziness, Impaired vision/preception  Visit Diagnosis: Dizziness and giddiness  Difficulty in walking, not elsewhere classified  Other symptoms and signs involving the nervous system     Problem List Patient Active Problem List   Diagnosis Date Noted  . Benign paroxysmal positional vertigo   . 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 Essential hypertension 07/31/2017  . GERD (gastroesophageal reflux disease) 07/31/2017  . Alcohol abuse 07/31/2017  . Brain aneurysm 07/31/2017  . CKD (chronic kidney disease), stage III ([place]) 07/31/2017  . LBBB (left bundle branch block) 07/31/2017  . Dizziness 07/31/2017  . Nausea & vomiting 07/31/2017  . Folate deficiency 06/30/2016  . Constipation 04/01/2016  . 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 Hemorrhoids, internal, with bleeding 01/08/2016  . B12 deficiency 06/12/2015  . Psoriatic arthritis ([place]) 04/15/2015  . Nutritional anemia 09/19/2014  . Iron deficiency anemia due to chronic blood loss   . Insomnia 03/17/2014  . Depression 02/18/2014  . Labial swelling 08/23/2012  . Rash and nonspecific skin eruption 06/30/2012  . Colon polyps 11/18/2011  . Cirrhosis ([place]) 08/19/2011    Deanna Thompson, PT 09/10/2017, 7:31 PM  [facility] [address] [place], [place], [address] Phone: [phone]   Fax:  [phone]  Name: Deanna Thompson MRN: [id number] Date of Birth: 10-Feb-1956

## 2017-09-13 ENCOUNTER — Ambulatory Visit: Payer: Commercial Managed Care - PPO | Admitting: Physical Therapy

## 2017-09-13 ENCOUNTER — Encounter: Payer: Self-pay | Admitting: Physical Therapy

## 2017-09-13 DIAGNOSIS — R42 Dizziness and giddiness: Secondary | ICD-10-CM | POA: Diagnosis not present

## 2017-09-13 DIAGNOSIS — R29818 Other symptoms and signs involving the nervous system: Secondary | ICD-10-CM

## 2017-09-13 DIAGNOSIS — R262 Difficulty in walking, not elsewhere classified: Secondary | ICD-10-CM

## 2017-09-13 NOTE — Patient Instructions (Addendum)
  Priority home exercises:   Eye tracking, single eye and both eyes  Walking with diagonal head turns  "E" turning head (side to side; vertically) eyes stay on E  Stand on pillow, eyes closed x 30 sec

## 2017-09-13 NOTE — Therapy (Signed)
[facility] [address] [place], [place], [address] Phone: [phone]   Fax:  [phone]  Physical Therapy Treatment  Patient Details  Name: Deanna Thompson MRN: [id number] Date of Birth: July 08, 1955 Referring Provider: Vance Gather [facility]); Rosalin Hawking [facility] CVA team); Antony Contras (GNA Stroke MD)   Encounter Date: 09/13/2017  PT End of Session - 09/13/17 1117    Visit Number  10    Number of Visits  13    Date for PT Re-Evaluation  09/21/17    Authorization Type  UHC, UMR 30 VL    Authorization Time Period  NA    Authorization - Visit Number  10    Authorization - Number of Visits  30 pt, ot, slp    PT Start Time  1016    PT Stop Time  1101    PT Time Calculation (min)  45 min    Activity Tolerance  Patient tolerated treatment well    Behavior During Therapy  WFL for tasks assessed/performed       Past Medical History:  Diagnosis Date  . Abdominal hernia   . Abscess of pulp of tooth 12/12/14   left side  . Anxiety   . Arthritis    back  and down flank  right side more than left  . B12 deficiency 06/12/2015  . Brain aneurysm    METAL COIL PREVENTS MRI IMAGING and CLIP  . Cirrhosis ([place])    Presumably alcoholic cirrhosis  . Complication of anesthesia   . Depression   . ETOH abuse    Quit in 07/2011   . Fatigue    due to Sjogren's disease  . Folate deficiency 06/30/2016  . GERD (gastroesophageal reflux disease)   . HTN (hypertension)    no longer medicated, dated on 04/25/12  . IBS (irritable bowel syndrome)   . Iron deficiency anemia due to chronic blood loss   . Myofascial pain   . Panic attack   . PONV (postoperative nausea and vomiting)   . Psoriatic arthritis ([place])   . Right inguinal hernia 08/16/2012  . Seizures ([place]) 1997   1 seizure from coil insertion but no more seizures and no meds for seizures  . Sjogren's disease ([place])   . [place] HEAD 02/03/2007   Qualifier:  Diagnosis of  By: Aline Brochure MD, Dorothyann Peng    . 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([place])     Past Surgical History:  Procedure Laterality Date  . AGILE CAPSULE N/A 06/21/2014   Procedure: AGILE CAPSULE;  Surgeon: Danie Binder, MD;  [facility];  Service: Endoscopy;  Laterality: N/A;  0700  . BIOPSY  10/13/2011   SLF: mild gastritis/duodenal polypoid lesion   . CEREBRAL ANEURYSM REPAIR  1997  . COLONOSCOPY  10/13/2011   ERX:[id number] hemorrhoids/varices rectal/lesion in ascending colon(HYPERPLASTIC POLYP)  . COLONOSCOPY N/A 01/17/2013   SLF: 1. Internal hemorrhoids 2. Ascending colon polyps 3.  Rectal varices.   . COLONOSCOPY N/A 08/10/2016   Dr. Oneida Alar: redundant left colon. external and internal hemorhroids. 5-10 year surveillance  . ESOPHAGOGASTRODUODENOSCOPY N/A 06/29/2014   SLF: 1. anemia due to polypoid lesions in teh antrum, and duodenal polyps 2. single duodenum AVM  . FLEXIBLE SIGMOIDOSCOPY N/A 01/21/2016   Procedure: FLEXIBLE SIGMOIDOSCOPY;  Surgeon: Danie Binder, MD;  [facility];  Service: Endoscopy;  Laterality: N/A;  245  . GIVENS CAPSULE STUDY N/A 06/29/2014   Procedure: GIVENS CAPSULE STUDY;  Surgeon: Danie Binder, MD;  Location[facility];  Service: Endoscopy;  Laterality: N/A;  . GIVENS CAPSULE STUDY N/A 07/13/2014   Procedure: GIVENS CAPSULE STUDY;  Surgeon: Danie Binder, MD;  [facility];  Service: Endoscopy;  Laterality: N/A;  700  . HEMORRHOID BANDING N/A 01/21/2016   Procedure: HEMORRHOID BANDING;  Surgeon: Danie Binder, MD;  [facility]: [facility];  Service: Endoscopy;  Laterality: N/A;  . INGUINAL HERNIA REPAIR Right 08/28/2013   Procedure: RIGHT NGUINAL HERNIORRHAPHY WITH MESH;  Surgeon: Jamesetta So, MD;  Location: [facility] ORS;  Service: General;  Laterality: Right;  . INSERTION OF MESH N/A 06/09/2013   Procedure: INSERTION OF MESH;  Surgeon: Jamesetta So, MD;  Location: [facility] ORS;  Service: General;  Laterality: N/A;  .  INSERTION OF MESH Right 08/28/2013   Procedure: INSERTION OF MESH;  Surgeon: Jamesetta So, MD;  Location: [facility] ORS;  Service: General;  Laterality: Right;  . PARACENTESIS  Feb 2014   10.7 liters  . TOOTH EXTRACTION Left 12/12/14  . UMBILICAL HERNIA REPAIR N/A 06/09/2013   Procedure: UMBILICAL HERNIORRHAPHY WITH MESH;  Surgeon: Jamesetta So, MD;  Location: [facility] ORS;  Service: General;  Laterality: N/A;    There were no vitals filed for this visit.  Subjective Assessment - 09/13/17 1018    Subjective  Amount of time she has a stuffy head feeling is down to 30%.     Patient is accompained by:  Family member    Pertinent History  HTN, ETOH abuse, cirrhosis, brain aneurysm, Sjogren's disease, basilar aneurysm clip and coiling, Lt MCA aneurysm    Diagnostic tests  CT scans x 2 (cannot do MRI becasue of the clips & coils);     Patient Stated Goals  stop having dizziness and get back to walking without LOB    Currently in Pain?  Yes    Pain Score  4     Pain Location  Wrist    Pain Orientation  Right;Left    Pain Descriptors / Indicators  Aching    Pain Type  Chronic pain    Pain Onset  More than a month ago    Pain Frequency  Intermittent    Aggravating Factors   making jewelry                        Vestibular Treatment/Exercise - 09/13/17 1107      Vestibular Treatment/Exercise   Vestibular Treatment Provided  Gaze      Eye/Head Exercise Vertical   Foot Position  seated    Reps  5    Comments  using both eyes, tuck chin until diplopia and try to "merge" the images; pt notes today the images are stacked vertically, not side by side and noted Lt eye drifts vertically with fatigue         Balance Exercises - 09/13/17 1115      Balance Exercises: Standing   Standing Eyes Opened  Narrow base of support (BOS);Foam/compliant surface;Head turns    Standing Eyes Closed  Narrow base of support (BOS);Foam/compliant surface    Gait with Head Turns  Forward;4 reps 40 ft  hall; diagonal each side; wide BOS    Other Standing Exercises  walking with tossing and catching scarf with eys following scarf and counting forwards/backwards by e3s or 4s; very little increase in head feeling "stuffy"        PT Education - 09/13/17 1118    Education Details  pt inquiring re: "someone to talk to re: the emotional aspects of dealing with all this." provided with Neuropsychologist Jenny Reichmann [place]'s information and if needs a referrral can speak to [place]; also provided names of 2 neuro-opthalmologists if she continues to have diploplia issues ~6 months post-CVA Frederico Hamman in [place] in [place])    Person(s) Educated  Patient    Methods  Explanation;Handout    Comprehension  Verbalized understanding       PT Short Term Goals - 08/30/17 1027      PT SHORT TERM GOAL #1   Title  Patient will have negative positional testing for BPPV. (Target all STGs 08/31/2017)    Time  3    Period  Weeks    Status  Achieved      PT SHORT TERM GOAL #2   Title  Patient will complete balance assessments (DGI vs FGA vs Berg) and set LTGs as appropriate.     Baseline  08/11/17 FGA completed    Time  3    Period  Weeks    Status  Achieved      PT SHORT TERM GOAL #3   Title  Patient will be independent with basic HEP to address vestibular deficits.     Time  3    Period  Weeks    Status  Achieved        PT Long Term Goals - 08/11/17 1558      PT LONG TERM GOAL #1   Title  Patient will be independent with updated HEP and able to verbalize her plan for continued community-based activity upon d/c from PT. (Target for all LTGs 09/21/2017)    Time  6    Period  Weeks    Status  New      PT LONG TERM GOAL #2   Title  Patient will ambulate modified independently (incr time) over level indoor and outdoor surfaces up to 400 ft with no assistive device and utilizing compensatory strategies as indicated.     Time  6    Period  Weeks    Status  New      PT LONG TERM GOAL #3    Title  Patient will be able to verbalize warning signs of CVA (including posterior circulation CVAs) and appropriate steps to take if CVA suspected.     Time  6    Period  Weeks    Status  New      PT LONG TERM GOAL #4   Title  FOTO functional status score will improve to >=65/100 indicating improved overall functional abilities.     Time  6    Period  Weeks    Status  New      PT LONG TERM GOAL #5   Title  Patient will improve FGA >=24/30 to indicate lesser fall risk and clinically significant improvement in balance.     Time  6    Period  Weeks    Status  New            Plan - 09/13/17 1118    Clinical Impression Statement  Reports her head is feeling better and better (much less fullness). Today focused on mobility while moving head and eyes and completing cognitive tasks. She did have increased BOS while completing these tasks, however no LOB. Worked on compliant surfaces as her painful feet allowed. Per pt request, provided resources for neuropsychologist and neuro-opthalmologist.     Rehab Potential  Good  Clinical Impairments Affecting Rehab Potential  anxiety, mixed peripheral and central vestibular deficits    PT Frequency  2x / week    PT Duration  6 weeks    PT Treatment/Interventions  ADLs/Self Care Home Management;Canalith Repostioning;Gait training;DME Instruction;Stair training;Functional mobility training;Therapeutic activities;Therapeutic exercise;Balance training;Neuromuscular re-education;Cognitive remediation;Patient/family education;Passive range of motion;Vestibular;Visual/perceptual remediation/compensation    PT Next Visit Plan  check LTGs and decide need to continue?; ?need to drop down to 1x/week (30 VL); work on balance training (compliant surfaces -as her feet/arches allow) and EC; gait with head turns, turns, dual task    PT Home Exercise Plan  VORx1; corner on pillow, feet partial tandem, EO head turns, EC head neutral; walking diagonal head turns; eye  tracking horiz and vertical; tandem walk; backward walk; SLS    Consulted and Agree with Plan of Care  Patient       Patient will benefit from skilled therapeutic intervention in order to improve the following deficits and impairments:  Abnormal gait, Decreased activity tolerance, Decreased balance, Decreased cognition, Decreased mobility, Decreased knowledge of use of DME, Dizziness, Impaired vision/preception  Visit Diagnosis: Dizziness and giddiness  Other symptoms and signs involving the nervous system  Difficulty in walking, not elsewhere classified     Problem List Patient Active Problem List   Diagnosis Date Noted  . Benign paroxysmal positional vertigo   . 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 Essential hypertension 07/31/2017  . GERD (gastroesophageal reflux disease) 07/31/2017  . Alcohol abuse 07/31/2017  . Brain aneurysm 07/31/2017  . CKD (chronic kidney disease), stage III ([place]) 07/31/2017  . LBBB (left bundle branch block) 07/31/2017  . Dizziness 07/31/2017  . Nausea & vomiting 07/31/2017  . Folate deficiency 06/30/2016  . Constipation 04/01/2016  . 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 Hemorrhoids, internal, with bleeding 01/08/2016  . B12 deficiency 06/12/2015  . Psoriatic arthritis ([place]) 04/15/2015  . Nutritional anemia 09/19/2014  . Iron deficiency anemia due to chronic blood loss   . Insomnia 03/17/2014  . Depression 02/18/2014  . Labial swelling 08/23/2012  . Rash and nonspecific skin eruption 06/30/2012  . Colon polyps 11/18/2011  . Cirrhosis ([place]) 08/19/2011    Rexanne Mano, PT 09/13/2017, 11:38 AM  [facility] [address] [place], [place], [address] Phone: [phone]   Fax:  [phone]  Name: Deanna Thompson MRN: [id number] Date of Birth: 02-12-1956

## 2017-09-17 ENCOUNTER — Encounter: Payer: Commercial Managed Care - PPO | Admitting: Rehabilitative and Restorative Service Providers"

## 2017-09-20 ENCOUNTER — Ambulatory Visit: Payer: Commercial Managed Care - PPO | Admitting: Physical Therapy

## 2017-09-24 ENCOUNTER — Ambulatory Visit: Payer: Commercial Managed Care - PPO | Admitting: Physical Therapy

## 2017-09-24 DIAGNOSIS — R29818 Other symptoms and signs involving the nervous system: Secondary | ICD-10-CM

## 2017-09-24 NOTE — Therapy (Signed)
[facility] [address] [place], [place], [address] Phone: [phone]   Fax:  [phone]  Physical Therapy Treatment and Discharge Summary  Patient Details  Name: Deanna Thompson MRN: [id number] Date of Birth: Feb 05, 1956 Referring Provider: Vance Gather [facility]); Rosalin Hawking [facility] CVA team); Antony Contras ([place] Stroke MD)   Encounter Date: 09/24/2017  Patient travels from >30 minutes to come to clinic and ran into terrible traffic due to an accident that delayed her arrival by 15 minutes (for a 45 minute session). Briefly spoke with her and she chose not to complete a shortened session based on finances and that she is doing much better. She had intended for today to be her last visit based on limited, much less frequent symptoms.   Educated to continue exercises that cause her symptoms and can stop doing any that are not causing symptoms. Again educated on use of habituation and choosing tasks that cause symptoms to become her "exercise."  No further skilled PT indicated at this time with pt requesting discharge.    Past Medical History:  Diagnosis Date  . Abdominal hernia   . Abscess of pulp of tooth 12/12/14   left side  . Anxiety   . Arthritis    back  and down flank  right side more than left  . B12 deficiency 06/12/2015  . Brain aneurysm    METAL COIL PREVENTS MRI IMAGING and CLIP  . Cirrhosis ([place])    Presumably alcoholic cirrhosis  . Complication of anesthesia   . Depression   . ETOH abuse    Quit in 07/2011   . Fatigue    due to Sjogren's disease  . Folate deficiency 06/30/2016  . GERD (gastroesophageal reflux disease)   . HTN (hypertension)    no longer medicated, dated on 04/25/12  . IBS (irritable bowel syndrome)   . Iron deficiency anemia due to chronic blood loss   . Myofascial pain   . Panic attack   . PONV (postoperative nausea and vomiting)   . Psoriatic arthritis ([place])   . Right  inguinal hernia 08/16/2012  . Seizures ([place]) 1997   1 seizure from coil insertion but no more seizures and no meds for seizures  . Sjogren's disease ([place])   . [place] HEAD 02/03/2007   Qualifier: Diagnosis of  By: Aline Brochure MD, Dorothyann Peng    . 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([place])     Past Surgical History:  Procedure Laterality Date  . AGILE CAPSULE N/A 06/21/2014   Procedure: AGILE CAPSULE;  Surgeon: Danie Binder, MD;  [facility];  Service: Endoscopy;  Laterality: N/A;  0700  . BIOPSY  10/13/2011   SLF: mild gastritis/duodenal polypoid lesion   . CEREBRAL ANEURYSM REPAIR  1997  . COLONOSCOPY  10/13/2011   IBB:[id number] hemorrhoids/varices rectal/lesion in ascending colon(HYPERPLASTIC POLYP)  . COLONOSCOPY N/A 01/17/2013   SLF: 1. Internal hemorrhoids 2. Ascending colon polyps 3.  Rectal varices.   . COLONOSCOPY N/A 08/10/2016   Dr. Oneida Alar: redundant left colon. external and internal hemorhroids. 5-10 year surveillance  . ESOPHAGOGASTRODUODENOSCOPY N/A 06/29/2014   SLF: 1. anemia due to polypoid lesions in teh antrum, and duodenal polyps 2. single duodenum AVM  . FLEXIBLE SIGMOIDOSCOPY N/A 01/21/2016   Procedure: FLEXIBLE SIGMOIDOSCOPY;  Surgeon: Danie Binder, MD;  [facility];  Service: Endoscopy;  Laterality: N/A;  245  . GIVENS CAPSULE STUDY N/A 06/29/2014   Procedure: GIVENS CAPSULE STUDY;  Surgeon: Danie Binder, MD;  [facility];  Service: Endoscopy;  Laterality: N/A;  . GIVENS CAPSULE STUDY N/A 07/13/2014   Procedure: GIVENS CAPSULE STUDY;  Surgeon: Danie Binder, MD;  [facility];  Service: Endoscopy;  Laterality: N/A;  700  . HEMORRHOID BANDING N/A 01/21/2016   Procedure: HEMORRHOID BANDING;  Surgeon: Danie Binder, MD;  [facility];  Service: Endoscopy;  Laterality: N/A;  . INGUINAL HERNIA REPAIR Right 08/28/2013   Procedure: RIGHT NGUINAL HERNIORRHAPHY WITH MESH;  Surgeon: Jamesetta So, MD;   Location: [facility];  Service: General;  Laterality: Right;  . INSERTION OF MESH N/A 06/09/2013   Procedure: INSERTION OF MESH;  Surgeon: Jamesetta So, MD;  Location: [facility];  Service: General;  Laterality: N/A;  . INSERTION OF MESH Right 08/28/2013   Procedure: INSERTION OF MESH;  Surgeon: Jamesetta So, MD;  Location: [facility];  Service: General;  Laterality: Right;  . PARACENTESIS  Feb 2014   10.7 liters  . TOOTH EXTRACTION Left 12/12/14  . UMBILICAL HERNIA REPAIR N/A 06/09/2013   Procedure: UMBILICAL HERNIORRHAPHY WITH MESH;  Surgeon: Jamesetta So, MD;  Location: [facility];  Service: General;  Laterality: N/A;    There were no vitals filed for this visit.                              PT Short Term Goals - 08/30/17 1027      PT SHORT TERM GOAL #1   Title  Patient will have negative positional testing for BPPV. (Target all STGs 08/31/2017)    Time  3    Period  Weeks    Status  Achieved      PT SHORT TERM GOAL #2   Title  Patient will complete balance assessments (DGI vs FGA vs [place]) and set LTGs as appropriate.     Baseline  08/11/17 FGA completed    Time  3    Period  Weeks    Status  Achieved      PT SHORT TERM GOAL #3   Title  Patient will be independent with basic HEP to address vestibular deficits.     Time  3    Period  Weeks    Status  Achieved        PT Long Term Goals - 09/24/17 1053      PT LONG TERM GOAL #1   Title  Patient will be independent with updated HEP and able to verbalize her plan for continued community-based activity upon d/c from PT. (Target for all LTGs 09/21/2017)    Time  6    Period  Weeks    Status  Achieved      PT LONG TERM GOAL #2   Title  Patient will ambulate modified independently (incr time) over level indoor and outdoor surfaces up to 400 ft with no assistive device and utilizing compensatory strategies as indicated.     Time  6    Period  Weeks    Status  Achieved      PT LONG TERM GOAL #3   Title  Patient  will be able to verbalize warning signs of CVA (including posterior circulation CVAs) and appropriate steps to take if CVA suspected.     Time  6    Period  Weeks    Status  Achieved      PT LONG TERM GOAL #4   Title  FOTO  functional status score will improve to >=65/100 indicating improved overall functional abilities.     Time  6    Period  Weeks    Status  Unable to assess Patient did not stay for full session and forgot to capture      PT LONG TERM GOAL #5   Title  Patient will improve FGA >=24/30 to indicate lesser fall risk and clinically significant improvement in balance.     Time  6    Period  Weeks    Status  Unable to assess Patient did not stay for full session and forgot to capture              Patient will benefit from skilled therapeutic intervention in order to improve the following deficits and impairments:     Visit Diagnosis: Other symptoms and signs involving the nervous system     Problem List Patient Active Problem List   Diagnosis Date Noted  . Benign paroxysmal positional vertigo   . 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 Essential hypertension 07/31/2017  . GERD (gastroesophageal reflux disease) 07/31/2017  . Alcohol abuse 07/31/2017  . Brain aneurysm 07/31/2017  . CKD (chronic kidney disease), stage III ([place]) 07/31/2017  . LBBB (left bundle branch block) 07/31/2017  . Dizziness 07/31/2017  . Nausea & vomiting 07/31/2017  . Folate deficiency 06/30/2016  . Constipation 04/01/2016  . 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 Hemorrhoids, internal, with bleeding 01/08/2016  . B12 deficiency 06/12/2015  . Psoriatic arthritis ([place]) 04/15/2015  . Nutritional anemia 09/19/2014  . Iron deficiency anemia due to chronic blood loss   . Insomnia 03/17/2014  . Depression 02/18/2014  . Labial swelling 08/23/2012  . Rash and nonspecific skin eruption 06/30/2012  . Colon polyps 11/18/2011  . Cirrhosis ([place]) 08/19/2011   PHYSICAL THERAPY DISCHARGE  SUMMARY  Visits from Start of Care: 10  Current functional level related to goals / functional outcomes: Walking independently; able to control symptoms with gaze fixation when needed   Remaining deficits: Some diplopia in certain fields of vision; infrequent "vertigo-like" symptoms   Education / Equipment: HEP; signs and symptoms of posterior circulation CVA vs vertigo and when to call 911  Plan: Patient agrees to discharge.  Patient goals were partially met. Patient is being discharged due to the physician's request.  ?????     Rexanne Mano, PT 09/24/2017, 10:56 AM  [facility] [address] [place], [place], [address] Phone: [phone]   Fax:  [phone]  Name: Deanna Thompson MRN: [id number] Date of Birth: 01-Apr-1955

## 2017-09-27 ENCOUNTER — Telehealth: Payer: Self-pay

## 2017-09-27 NOTE — Telephone Encounter (Signed)
As of September 27, 2017, Linzess will be in a Step Therapy program as a Step 2 drug. I have called pt to get info needed on her prescription card.  She will check on it and call me back.

## 2017-09-28 NOTE — Telephone Encounter (Signed)
I called [phone] and spoke to [facility] and the PA request has been submitted over the phone. We will receive info via fax.  Reference number [id number].

## 2017-09-29 NOTE — Telephone Encounter (Addendum)
PA denied for the Linzess 145 mcg. PT needs to try polyethylene or Lactulose.  Dr. Oneida Alar, please advise!

## 2017-09-30 NOTE — Telephone Encounter (Signed)
the patient HAS TRIED AND FAILED LACTULOSE AND POLYETHYLENE GLYCOL. SUBMIT PA FOR LINZESS.

## 2017-10-02 ENCOUNTER — Other Ambulatory Visit: Payer: Self-pay | Admitting: Family Medicine

## 2017-10-04 ENCOUNTER — Telehealth: Payer: Self-pay | Admitting: Adult Health

## 2017-10-04 DIAGNOSIS — I6302 Cerebral infarction due to thrombosis of basilar artery: Secondary | ICD-10-CM

## 2017-10-04 NOTE — Telephone Encounter (Signed)
I called [employer] and spoke to [place]. She made the correction and Linzess has been approved. Good through 10/05/2018. Confirmation # [id number].

## 2017-10-04 NOTE — Telephone Encounter (Signed)
Left vm for patient to call back about stroke and dizzy rehab orders. PT last saw therapy on 09/24/2017.

## 2017-10-04 NOTE — Telephone Encounter (Signed)
Received the fax that Linzess has been approved. Faxed to [employer].  Sent to scan.

## 2017-10-04 NOTE — Addendum Note (Signed)
Addended by: Venancio Poisson on: 10/04/2017 04:21 PM   Modules accepted: Orders

## 2017-10-04 NOTE — Telephone Encounter (Signed)
Placed additional order for continued PT due to ongoing dizziness complaints.

## 2017-10-04 NOTE — Telephone Encounter (Signed)
Rn call patient to give her Janett Billow NP recommendations. Rn stated Janett Billow  wanted to know if she was doing her exercise regularly per her discharge instructions. Patient stated she was told to do them twice a day. PT stated she was getting therapy for the stroke, and dizzy spells. Also if she got dizzy,and nausea to stop the therapy, and do them when she feels better.Pt stated she does not work, and is retired. Rn stated a call was made to outpatient, and another order is needed. Pt stated Jeani Hawking Pt told her she can come back as needed. Rn stated a call was made,and she was discharge with the home plan. Rn stated a message will be sent to [facility] NP for outpatient therapy orders. Pt verbalized understanding.

## 2017-10-04 NOTE — Telephone Encounter (Signed)
Rn receive incoming call from patient. Pt stated she has more issues since being discharge from outpatient therapy on 09/24/2017. Pt stated she was doing well up till 3 days.She was doing the home exercises, and was doing well. Pt stated three days she has been experiencing dizzy spells, headaches, nausea, balancing issues. She is not sure if its the dizzy spells causing all the equilibrium, or the stroke. Pt would like additional rehab orders if necessary. Rn stated a message will be sent to [facility] NP. Pt verbalized understanding.

## 2017-10-04 NOTE — Telephone Encounter (Signed)
Pt states when she last saw NP Janett Billow she was told by her if additional orders were needed for Neuro rehab NP Janett Billow would send them as it relates to stroke and vertigo.  Please call

## 2017-10-04 NOTE — Telephone Encounter (Signed)
Per PT notes, patient requested early discharge due to >30 minute drive for session and patient felt as though she was improving. Last appointment with PT 09/24/17. Please ensure patient is continuing to do home exercises that she had learned during physical therapy. Per note, as goals were partially met, I will be happy to place additional order if patient has been doing exercises at home but has continued to decline since stopping PT.

## 2017-10-05 ENCOUNTER — Inpatient Hospital Stay (HOSPITAL_COMMUNITY): Payer: Commercial Managed Care - PPO | Attending: Hematology

## 2017-10-05 ENCOUNTER — Encounter: Payer: Commercial Managed Care - PPO | Admitting: Physical Therapy

## 2017-10-05 DIAGNOSIS — R5383 Other fatigue: Secondary | ICD-10-CM | POA: Insufficient documentation

## 2017-10-05 DIAGNOSIS — Z7982 Long term (current) use of aspirin: Secondary | ICD-10-CM | POA: Insufficient documentation

## 2017-10-05 DIAGNOSIS — Z79899 Other long term (current) drug therapy: Secondary | ICD-10-CM | POA: Diagnosis not present

## 2017-10-05 DIAGNOSIS — N183 Chronic kidney disease, stage 3 (moderate): Secondary | ICD-10-CM | POA: Insufficient documentation

## 2017-10-05 DIAGNOSIS — I129 Hypertensive chronic kidney disease with stage 1 through stage 4 chronic kidney disease, or unspecified chronic kidney disease: Secondary | ICD-10-CM | POA: Diagnosis not present

## 2017-10-05 DIAGNOSIS — Z23 Encounter for immunization: Secondary | ICD-10-CM

## 2017-10-05 DIAGNOSIS — E538 Deficiency of other specified B group vitamins: Secondary | ICD-10-CM

## 2017-10-05 DIAGNOSIS — Z87891 Personal history of nicotine dependence: Secondary | ICD-10-CM | POA: Diagnosis not present

## 2017-10-05 DIAGNOSIS — D500 Iron deficiency anemia secondary to blood loss (chronic): Secondary | ICD-10-CM | POA: Insufficient documentation

## 2017-10-05 DIAGNOSIS — K7460 Unspecified cirrhosis of liver: Secondary | ICD-10-CM | POA: Diagnosis not present

## 2017-10-05 LAB — COMPREHENSIVE METABOLIC PANEL
ALK PHOS: 77 U/L (ref 38–126)
ALT: 16 U/L (ref 0–44)
ANION GAP: 12 (ref 5–15)
AST: 21 U/L (ref 15–41)
Albumin: 4.3 g/dL (ref 3.5–5.0)
BILIRUBIN TOTAL: 0.6 mg/dL (ref 0.3–1.2)
BUN: 26 mg/dL — AB (ref 6–20)
CO2: 26 mmol/L (ref 22–32)
CREATININE: 1.21 mg/dL — AB (ref 0.44–1.00)
Calcium: 9.3 mg/dL (ref 8.9–10.3)
Chloride: 98 mmol/L (ref 98–111)
GFR calc Af Amer: 55 mL/min — ABNORMAL LOW (ref >60)
GFR calc non Af Amer: 48 mL/min — ABNORMAL LOW (ref >60)
GLUCOSE: 86 mg/dL (ref 70–99)
Potassium: 3.8 mmol/L (ref 3.5–5.1)
SODIUM: 136 mmol/L (ref 135–145)
Total Protein: 8 g/dL (ref 6.5–8.1)

## 2017-10-05 LAB — CBC WITH DIFFERENTIAL/PLATELET
Basophils Absolute: 0 10*3/uL (ref 0.0–0.1)
Basophils Relative: 1 %
Eosinophils Absolute: 0.2 10*3/uL (ref 0.0–0.7)
Eosinophils Relative: 2 %
HEMATOCRIT: 38.4 % (ref 36.0–46.0)
HEMOGLOBIN: 12.7 g/dL (ref 12.0–15.0)
LYMPHS PCT: 20 %
Lymphs Abs: 1.6 10*3/uL (ref 0.7–4.0)
MCH: 30.5 pg (ref 26.0–34.0)
MCHC: 33.1 g/dL (ref 30.0–36.0)
MCV: 92.3 fL (ref 78.0–100.0)
MONO ABS: 0.6 10*3/uL (ref 0.1–1.0)
MONOS PCT: 8 %
NEUTROS ABS: 5.4 10*3/uL (ref 1.7–7.7)
NEUTROS PCT: 69 %
Platelets: 237 10*3/uL (ref 150–400)
RBC: 4.16 MIL/uL (ref 3.87–5.11)
RDW: 13.5 % (ref 11.5–15.5)
WBC: 7.8 10*3/uL (ref 4.0–10.5)

## 2017-10-05 LAB — IRON AND TIBC
IRON: 58 ug/dL (ref 28–170)
SATURATION RATIOS: 13 % (ref 10.4–31.8)
TIBC: 452 ug/dL — ABNORMAL HIGH (ref 250–450)
UIBC: 394 ug/dL

## 2017-10-05 LAB — FERRITIN: Ferritin: 15 ng/mL (ref 11–307)

## 2017-10-05 LAB — VITAMIN B12: VITAMIN B 12: 207 pg/mL (ref 180–914)

## 2017-10-06 ENCOUNTER — Other Ambulatory Visit (HOSPITAL_COMMUNITY): Payer: Commercial Managed Care - PPO

## 2017-10-06 ENCOUNTER — Ambulatory Visit: Payer: Commercial Managed Care - PPO | Attending: Family Medicine | Admitting: Physical Therapy

## 2017-10-06 ENCOUNTER — Encounter: Payer: Self-pay | Admitting: Physical Therapy

## 2017-10-06 ENCOUNTER — Other Ambulatory Visit: Payer: Self-pay

## 2017-10-06 DIAGNOSIS — R262 Difficulty in walking, not elsewhere classified: Secondary | ICD-10-CM | POA: Diagnosis present

## 2017-10-06 DIAGNOSIS — R42 Dizziness and giddiness: Secondary | ICD-10-CM | POA: Insufficient documentation

## 2017-10-06 DIAGNOSIS — R29818 Other symptoms and signs involving the nervous system: Secondary | ICD-10-CM | POA: Diagnosis present

## 2017-10-06 DIAGNOSIS — H8113 Benign paroxysmal vertigo, bilateral: Secondary | ICD-10-CM

## 2017-10-06 NOTE — Patient Instructions (Signed)
With all exercises, be sure to let symptoms subside prior to next rep.  Start tomorrow doing your Brandt-Daroff exercises (side to side) 5 reps, 3 times per day.   Scrolling at most try 30 sec and see how you feel. May need to do 10 sec at a time. Symptoms no more than a 5. Try to do slower speed if you can tolerate.  Goal is to be able to do the scrolling for 30 seconds with no change in symptoms.

## 2017-10-06 NOTE — Therapy (Signed)
[facility] [address] [place], [place], [address] Phone: [phone]   Fax:  [phone]  Physical Therapy Evaluation  Patient Details  Name: Deanna Thompson MRN: [id number] Date of Birth: [birth year] Referring Provider: Venancio Poisson, NP   Encounter Date: 10/06/2017  PT End of Session - 10/06/17 1736    Visit Number  1    Number of Visits  5    Date for PT Re-Evaluation  11/05/17    Authorization Type  [employer], UMR 30 VL    Authorization Time Period  NA    Authorization - Visit Number  11 includes 10 visits prior episode of PT 5-08/2017    Authorization - Number of Visits  30 pt, ot, slp    PT Start Time  0803    PT Stop Time  0845    PT Time Calculation (min)  42 min    Activity Tolerance  Patient tolerated treatment well    Behavior During Therapy  [facility] for tasks assessed/performed       Past Medical History:  Diagnosis Date  . Abdominal hernia   . Abscess of pulp of tooth 12/12/14   left side  . Anxiety   . Arthritis    back  and down flank  right side more than left  . B12 deficiency 06/12/2015  . Brain aneurysm    METAL COIL PREVENTS MRI IMAGING and CLIP  . Cirrhosis ([place])    Presumably alcoholic cirrhosis  . Complication of anesthesia   . Depression   . ETOH abuse    Quit in 07/2011   . Fatigue    due to Sjogren's disease  . Folate deficiency 06/30/2016  . GERD (gastroesophageal reflux disease)   . HTN (hypertension)    no longer medicated, dated on 04/25/12  . IBS (irritable bowel syndrome)   . Iron deficiency anemia due to chronic blood loss   . Myofascial pain   . Panic attack   . PONV (postoperative nausea and vomiting)   . Psoriatic arthritis ([place])   . Right inguinal hernia 08/16/2012  . Seizures ([place]) 1997   1 seizure from coil insertion but no more seizures and no meds for seizures  . Sjogren's disease ([place])   . [place] HEAD 02/03/2007   Qualifier: Diagnosis of  By:  Aline Brochure MD, Dorothyann Peng    . 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([place])     Past Surgical History:  Procedure Laterality Date  . AGILE CAPSULE N/A 06/21/2014   Procedure: AGILE CAPSULE;  Surgeon: Danie Binder, MD;  [facility];  Service: Endoscopy;  Laterality: N/A;  0700  . BIOPSY  10/13/2011   SLF: mild gastritis/duodenal polypoid lesion   . CEREBRAL ANEURYSM REPAIR  1997  . COLONOSCOPY  10/13/2011   ZSW:FUXNATFT hemorrhoids/varices rectal/lesion in ascending colon(HYPERPLASTIC POLYP)  . COLONOSCOPY N/A 01/17/2013   SLF: 1. Internal hemorrhoids 2. Ascending colon polyps 3.  Rectal varices.   . COLONOSCOPY N/A 08/10/2016   Dr. Oneida Alar: redundant left colon. external and internal hemorhroids. 5-10 year surveillance  . ESOPHAGOGASTRODUODENOSCOPY N/A 06/29/2014   SLF: 1. anemia due to polypoid lesions in teh antrum, and duodenal polyps 2. single duodenum AVM  . FLEXIBLE SIGMOIDOSCOPY N/A 01/21/2016   Procedure: FLEXIBLE SIGMOIDOSCOPY;  Surgeon: Danie Binder, MD;  [facility];  Service: Endoscopy;  Laterality: N/A;  245  . GIVENS CAPSULE STUDY N/A 06/29/2014   Procedure: GIVENS CAPSULE STUDY;  Surgeon: Danie Binder, MD;  Location: [facility];  Service: Endoscopy;  Laterality: N/A;  . GIVENS CAPSULE STUDY N/A 07/13/2014   Procedure: GIVENS CAPSULE STUDY;  Surgeon: Danie Binder, MD;  [facility];  Service: Endoscopy;  Laterality: N/A;  700  . HEMORRHOID BANDING N/A 01/21/2016   Procedure: HEMORRHOID BANDING;  Surgeon: Danie Binder, MD;  [facility];  Service: Endoscopy;  Laterality: N/A;  . INGUINAL HERNIA REPAIR Right 08/28/2013   Procedure: RIGHT NGUINAL HERNIORRHAPHY WITH MESH;  Surgeon: Jamesetta So, MD;  Location: [facility] ORS;  Service: General;  Laterality: Right;  . INSERTION OF MESH N/A 06/09/2013   Procedure: INSERTION OF MESH;  Surgeon: Jamesetta So, MD;  Location: [facility] ORS;  Service: General;  Laterality: N/A;  . INSERTION OF MESH  Right 08/28/2013   Procedure: INSERTION OF MESH;  Surgeon: Jamesetta So, MD;  Location: [facility] OR[facility];  Service: General;  Laterality: Right;  . PARACENTESIS  Feb 2014   10.7 liters  . TOOTH EXTRACTION Left 12/12/14  . UMBILICAL HERNIA REPAIR N/A 06/09/2013   Procedure: UMBILICAL HERNIORRHAPHY WITH MESH;  Surgeon: Jamesetta So, MD;  Location: [facility] ORS;  Service: General;  Laterality: N/A;    There were no vitals filed for this visit.   Subjective Assessment - 10/06/17 0809    Subjective  Last Thursday the group of symptoms worsened (stuffy head, nausea, dizzy, discomfort-like car sickness), Friday-Sunday crappy. Called because couldn't tell if this was stroke symptoms worsening (due to recent increased stress due to personal and financial issues and/or due to inability to sleep) vs vertigo. I knew if vertigo you need to be treated for it to get better. I had a moment of spinning starting when I bent over and stood back up. I even had an aura the other day and I haven't had a migraine in years. After the aura there was no headache and no nausea (usually would come after aura).     Patient is accompained by:  Family member    Pertinent History  HTN, ETOH abuse, cirrhosis, brain aneurysm, Sjogren's disease, basilar aneurysm clip and coiling, Lt MCA aneurysm    Diagnostic tests  CT scans x 2 (cannot do MRI becasue of the clips & coils);     Patient Stated Goals  stop having dizziness and get back to walking without LOB    Currently in Pain?  No/denies    Pain Onset  More than a month ago         [facility] PT Assessment - 10/06/17 1728      Assessment   Medical Diagnosis  Cerebrovascular accident (CVA) due to thrombosis of basilar artery     Referring Provider  Venancio Poisson, NP    Onset Date/Surgical Date  07/31/17 initial onset; acute worsening 09/30/17    Prior Therapy  for BPPV and after basilar aneurysm clipping      Precautions   Precautions  Fall      Restrictions   Weight Bearing  Restrictions  No      Balance Screen   Has the patient fallen in the past 6 months  No    Has the patient had a decrease in activity level because of a fear of falling?   No    Is the patient reluctant to leave their home because of a fear of falling?   No      Home Film/video editor residence    Living Arrangements  Spouse/significant other  Prior Function   Level of Independence  Independent    Vocation  Part time employment    Vocation Requirements  makes jewelry; sells at shows      Cognition   Overall Cognitive Status  History of cognitive impairments - at baseline reports impaired STM after craniectomy      Observation/Other Assessments   Observations  walks from lobby to exam room with wide BOS but not LOB    Focus on Therapeutic Outcomes (FOTO)   delete-not set up      Ambulation/Gait   Gait Comments  --           Vestibular Assessment - 10/06/17 1731      Symptom Behavior   Type of Dizziness  "Funny feeling in head"    Frequency of Dizziness  daily    Duration of Dizziness  start to spin--split seconds; foggy brain feeling, imbalance- all day    Aggravating Factors  Activity in general;Turning head quickly;Forward bending;Driving    Relieving Factors  Head stationary;Rest;Slow movements      Occulomotor Exam   Occulomotor Alignment  Normal    Spontaneous  Absent      Positional Testing   Dix-Hallpike  Dix-Hallpike Left    Sidelying Test  Sidelying Right;Sidelying Left      Dix-Hallpike Left   Dix-Hallpike Left Duration  8 second delayed onset; x 12 seconds    Dix-Hallpike Left Symptoms  Upbeat, left rotatory nystagmus;Upbeat Nystagmus      Sidelying Right   Sidelying Right Duration  0    Sidelying Right Symptoms  No nystagmus      Sidelying Left   Sidelying Left Duration  0    Sidelying Left Symptoms  No nystagmus          Objective measurements completed on examination: See above findings.       Vestibular  Treatment/Exercise - 10/06/17 1739      Vestibular Treatment/Exercise   Vestibular Treatment Provided  Canalith Repositioning;Habituation    Canalith Repositioning  Epley Manuever Left    Habituation Exercises  Brandt Daroff       EPLEY MANUEVER LEFT   Number of Reps   2    Overall Response   Improved Symptoms     RESPONSE DETAILS LEFT  no nystagmus on second Epley with pt reporting "feels like it's going to spin, but then doesn't on 2nd rep)      Nestor Lewandowsky   Number of Reps   1    Symptom Description   up from rt side +symptoms x 3 seconds with no nystagmus seen            PT Education - 10/06/17 1734    Education Details  discussed probable large component of stress causing some of the symptoms, however also + BPPV; Brandt-Daroff HEP    Person(s) Educated  Patient    Methods  Explanation;Demonstration;Handout    Comprehension  Verbalized understanding          PT Long Term Goals - 10/06/17 1751      PT LONG TERM GOAL #1   Title  Patient will be independent with updated HEP upon d/c from PT. (Target for all LTGs 11/05/2017)    Time  4    Period  Weeks    Status  New    Target Date  11/05/17      PT LONG TERM GOAL #2   Title  Patient will have negative positional testing for BPPV.  Time  4    Period  Weeks    Status  New      PT LONG TERM GOAL #3   Title  Patient will be able to verbalize warning signs of CVA (including posterior circulation CVAs) and appropriate steps to take if CVA suspected.     Time  4    Period  Weeks    Status  New            Plan - 10/06/17 1738    Clinical Impression Statement  Patient again referred to OPPT for increasing symptoms of vertigo. (Recently discharged 09/20/17 due to financial concerns. Her symptoms began to worsen and she requested MD write orders for return to PT). Patient with +left posterior canal BPPV, treated with Epley with resolution of symptoms on repeat left Dix-Hallpike. Patient continues with  difficulty with both central and peripheral vestibular deficits and reviewed HEP relative to this. If symptoms persist, patient can benefit from PT for cannalith repositioning and vestibular rehab. See interventions listed below.     History and Personal Factors relevant to plan of care:  PMH-HTN, ETOH abuse, cirrhosis, brain aneurysm, Sjogren's disease, basilar aneurysm clip and coiling, Lt MCA aneurysm; Personal factors-coping style/emotional & behavioral response; slower anticipated progress due to central and peripheral bestibular deficits    Clinical Presentation  Evolving    Clinical Presentation due to:  symptoms with recent worsening and with +BPPV again    Clinical Decision Making  Moderate    Rehab Potential  Good    Clinical Impairments Affecting Rehab Potential  anxiety, mixed peripheral and central vestibular deficits    PT Frequency  1x / week    PT Duration  4 weeks    PT Treatment/Interventions  ADLs/Self Care Home Management;Canalith Repostioning;Therapeutic activities;Therapeutic exercise;Balance training;Neuromuscular re-education;Cognitive remediation;Patient/family education;Passive range of motion;Vestibular;Visual/perceptual remediation/compensation;Biofeedback    PT Next Visit Plan  check for left posterior BPPV and treat accordingly; check progress with HEP and update if needed.    PT Home Exercise Plan  from recent PT episode of care:  VORx1; corner on pillow, feet partial tandem, EO head turns, EC head neutral; walking diagonal head turns; eye tracking horiz and vertical; tandem walk; backward walk; SLS    Consulted and Agree with Plan of Care  Patient       Patient will benefit from skilled therapeutic intervention in order to improve the following deficits and impairments:  Abnormal gait, Decreased activity tolerance, Decreased balance, Decreased cognition, Decreased mobility, Decreased knowledge of use of DME, Dizziness, Impaired vision/preception, Difficulty  walking  Visit Diagnosis: BPPV (benign paroxysmal positional vertigo), bilateral - Plan: PT plan of care cert/re-cert  Dizziness and giddiness - Plan: PT plan of care cert/re-cert  Other symptoms and signs involving the nervous system - Plan: PT plan of care cert/re-cert  Difficulty in walking, not elsewhere classified - Plan: PT plan of care cert/re-cert     Problem List Patient Active Problem List   Diagnosis Date Noted  . Benign paroxysmal positional vertigo   . 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 Essential hypertension 07/31/2017  . GERD (gastroesophageal reflux disease) 07/31/2017  . Alcohol abuse 07/31/2017  . Brain aneurysm 07/31/2017  . CKD (chronic kidney disease), stage III ([place]) 07/31/2017  . LBBB (left bundle branch block) 07/31/2017  . Dizziness 07/31/2017  . Nausea & vomiting 07/31/2017  . Folate deficiency 06/30/2016  . Constipation 04/01/2016  . 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 Hemorrhoids, internal, with bleeding 01/08/2016  . B12 deficiency 06/12/2015  .  Psoriatic arthritis ([place]) 04/15/2015  . Nutritional anemia 09/19/2014  . Iron deficiency anemia due to chronic blood loss   . Insomnia 03/17/2014  . Depression 02/18/2014  . Labial swelling 08/23/2012  . Rash and nonspecific skin eruption 06/30/2012  . Colon polyps 11/18/2011  . Cirrhosis ([place]) 08/19/2011    Rexanne Mano, PT 10/06/2017, 7:16 PM  [facility] [address] [place], [place], [address] Phone: [phone]   Fax:  [phone]  Name: Deanna Thompson MRN: [id number] Date of Birth: 1955/12/18

## 2017-10-07 ENCOUNTER — Encounter (HOSPITAL_COMMUNITY): Payer: Self-pay | Admitting: Internal Medicine

## 2017-10-07 ENCOUNTER — Inpatient Hospital Stay (HOSPITAL_BASED_OUTPATIENT_CLINIC_OR_DEPARTMENT_OTHER): Payer: Commercial Managed Care - PPO | Admitting: Internal Medicine

## 2017-10-07 VITALS — BP 102/67 | HR 73 | Temp 99.0°F | Resp 18 | Wt 230.6 lb

## 2017-10-07 DIAGNOSIS — Z7982 Long term (current) use of aspirin: Secondary | ICD-10-CM

## 2017-10-07 DIAGNOSIS — I129 Hypertensive chronic kidney disease with stage 1 through stage 4 chronic kidney disease, or unspecified chronic kidney disease: Secondary | ICD-10-CM

## 2017-10-07 DIAGNOSIS — D500 Iron deficiency anemia secondary to blood loss (chronic): Secondary | ICD-10-CM

## 2017-10-07 DIAGNOSIS — Z87891 Personal history of nicotine dependence: Secondary | ICD-10-CM

## 2017-10-07 DIAGNOSIS — N183 Chronic kidney disease, stage 3 (moderate): Secondary | ICD-10-CM

## 2017-10-07 DIAGNOSIS — R5383 Other fatigue: Secondary | ICD-10-CM | POA: Diagnosis not present

## 2017-10-07 DIAGNOSIS — K7460 Unspecified cirrhosis of liver: Secondary | ICD-10-CM | POA: Diagnosis not present

## 2017-10-07 DIAGNOSIS — Z79899 Other long term (current) drug therapy: Secondary | ICD-10-CM

## 2017-10-07 NOTE — Patient Instructions (Signed)
[facility] at [facility] Discharge Instructions  You saw Dr. Higgs today.   Thank you for choosing [facility] at [facility] to provide your oncology and hematology care.  To afford each patient quality time with our provider, please arrive at least 15 minutes before your scheduled appointment time.   If you have a lab appointment with the Cancer Center please come in thru the  Main Entrance and check in at the main information desk  You need to re-schedule your appointment should you arrive 10 or more minutes late.  We strive to give you quality time with our providers, and arriving late affects you and other patients whose appointments are after yours.  Also, if you no show three or more times for appointments you may be dismissed from the clinic at the providers discretion.     Again, thank you for choosing [facility].  Our hope is that these requests will decrease the amount of time that you wait before being seen by our physicians.       _____________________________________________________________  Should you have questions after your visit to [facility], please contact our office at [phone] between the hours of 8:30 a.m. and 4:30 p.m.  Voicemails left after 4:30 p.m. will not be returned until the following business day.  For prescription refill requests, have your pharmacy contact our office.       Resources For Cancer Patients and their Caregivers ? American Cancer Society: Can assist with transportation, wigs, general needs, runs Look Good Feel Better.        1-888-227-6333 ? Cancer Care: Provides financial assistance, online support groups, medication/co-pay assistance.  1-800-813-HOPE (4673) ? [facility] Assists [place] cancer patients and their families through emotional , educational and financial support.  336-427-4357 ? [place] DSS Where to apply for food  stamps, Medicaid and utility assistance. 336-342-1394 ? [facility]: Transportation to medical appointments. 336-347-2287 ? Social Security Administration: May apply for disability if have a Stage IV cancer. 336-342-7796 1-800-772-1213 ? [place] Aging, Disability and Transit Services: Assists with nutrition, care and transit needs. 336-349-2343  Cancer Center Support Programs:   > Cancer Support Group  2nd Tuesday of the month 1pm-2pm, Journey Room   > Creative Journey  3rd Tuesday of the month 1130am-1pm, Journey Room     

## 2017-10-08 NOTE — Progress Notes (Signed)
Diagnosis Iron deficiency anemia due to chronic blood loss - Plan: CBC with Differential/Platelet, Comprehensive metabolic panel, Lactate dehydrogenase, Ferritin  Staging Cancer Staging No matching staging information was found for the patient.  Assessment and Plan:  1.  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  She underwent a flexible sigmoidoscopy with Dr. Fields on 01/21/2016 showing rectal bleeding due to proctitis and possibly internal hemorrhoids.    Due to cost of IV iron replacement therapy, she is currently on PO iron in an attempt to reduce/eliminate future need for IV iron replacement treatment.    Pt is seen today for follow-up to go over labs done 10/05/2017 reviewed with pt and shows WBC 7.8 HB 12.7 plts 237,000, ferritin decreased at 15.    I have discussed with the patient Hb is adequate at 12.7, but ferritin is consistent with IDA.  She has option of ongoing oral iron and or MVI as she does not desire IV iron.  She denies any blood in stool or urine.  She is recommended to continue to follow-up with Dr. Fields of GI.    Pt will RTC in 03/2018 for follow-up and repeat labs.  She should notify the office if she has any problems prior to her next visit.    2.  Cirrhosis.  Pt should continue to follow-up with GI as directed.  LFTS WNL on labs done 10/05/2017.    3.  HTN.  BP is 102/67.  Follow-up with PCP as directed.    4.  CVA.  BP is 102/67.  Follow-up with PCP or neurology as directed.    5.  Fatigue.  I have discussed with her labs show adequate HB 12.7 with ferritin that is decreased at 15.  She should continue iron supplementation.  Will determine if  symptoms improve with improvement in iron levels.    Current Status:  Pt is seen today for follow-up.  She is here to go over labs.  She denies any blood in stool or urine.    Problem List Patient Active Problem List   Diagnosis Date Noted  . Benign paroxysmal positional vertigo [H81.10]   . 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 Essential hypertension [I10] 07/31/2017  . GERD (gastroesophageal reflux disease) [K21.9] 07/31/2017  . Alcohol abuse [F10.10] 07/31/2017  . Brain aneurysm [I67.1] 07/31/2017  . CKD (chronic kidney disease), stage III (HCC) [N18.3] 07/31/2017  . LBBB (left bundle branch block) [I44.7] 07/31/2017  . Dizziness [R42] 07/31/2017  . Nausea & vomiting [R11.2] 07/31/2017  . Folate deficiency [E53.8] 06/30/2016  . Constipation [K59.00] 04/01/2016  . 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 Hemorrhoids, internal, with bleeding [K64.8] 01/08/2016  . B12 deficiency [E53.8] 06/12/2015  . Psoriatic arthritis (HCC) [L40.50] 04/15/2015  . Nutritional anemia [D53.9] 09/19/2014  . Iron deficiency anemia due to chronic blood loss [D50.0]   . Insomnia [G47.00] 03/17/2014  . Depression [F32.9] 02/18/2014  . Labial swelling [N94.89] 08/23/2012  . Rash and nonspecific skin eruption [R21] 06/30/2012  . Colon polyps [K63.5] 11/18/2011  . Cirrhosis (HCC) [K74.60] 08/19/2011    Past Medical History Past Medical History:  Diagnosis Date  . Abdominal hernia   . Abscess of pulp of tooth 12/12/14     left side  . Anxiety   . Arthritis    back  and down flank  right side more than left  . B12 deficiency 06/12/2015  . Brain aneurysm    METAL COIL PREVENTS MRI IMAGING and CLIP  . Cirrhosis ([place])    Presumably alcoholic cirrhosis  . Complication of anesthesia   . Depression   . ETOH abuse    Quit in 07/2011   . Fatigue    due to Sjogren's disease  . Folate deficiency 06/30/2016  . GERD (gastroesophageal reflux disease)   . HTN (hypertension)    no longer medicated, dated  on 04/25/12  . IBS (irritable bowel syndrome)   . Iron deficiency anemia due to chronic blood loss   . Myofascial pain   . Panic attack   . PONV (postoperative nausea and vomiting)   . Psoriatic arthritis ([place])   . Right inguinal hernia 08/16/2012  . Seizures ([place]) 1997   1 seizure from coil insertion but no more seizures and no meds for seizures  . Sjogren's disease ([place])   . [place] HEAD 02/03/2007   Qualifier: Diagnosis of  By: Aline Brochure MD, Dorothyann Peng    . 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[facility])     Past Surgical History Past Surgical History:  Procedure Laterality Date  . AGILE CAPSULE N/A 06/21/2014   Procedure: AGILE CAPSULE;  Surgeon: Danie Binder, MD;  [facility];  Service: Endoscopy;  Laterality: N/A;  0700  . BIOPSY  10/13/2011   SLF: mild gastritis/duodenal polypoid lesion   . CEREBRAL ANEURYSM REPAIR  1997  . COLONOSCOPY  10/13/2011   ZHG:DJMEQAST hemorrhoids/varices rectal/lesion in ascending colon(HYPERPLASTIC POLYP)  . COLONOSCOPY N/A 01/17/2013   SLF: 1. Internal hemorrhoids 2. Ascending colon polyps 3.  Rectal varices.   . COLONOSCOPY N/A 08/10/2016   Dr. Oneida Alar: redundant left colon. external and internal hemorhroids. 5-10 year surveillance  . ESOPHAGOGASTRODUODENOSCOPY N/A 06/29/2014   SLF: 1. anemia due to polypoid lesions in teh antrum, and duodenal polyps 2. single duodenum AVM  . FLEXIBLE SIGMOIDOSCOPY N/A 01/21/2016   Procedure: FLEXIBLE SIGMOIDOSCOPY;  Surgeon: Danie Binder, MD;  [facility];  Service: Endoscopy;  Laterality: N/A;  245  . GIVENS CAPSULE STUDY N/A 06/29/2014   Procedure: GIVENS CAPSULE STUDY;  Surgeon: Danie Binder, MD;  [facility];  Service: Endoscopy;  Laterality: N/A;  . GIVENS CAPSULE STUDY N/A 07/13/2014   Procedure: GIVENS CAPSULE STUDY;  Surgeon: Danie Binder, MD;  [facility];  Service: Endoscopy;  Laterality: N/A;  700  . HEMORRHOID BANDING N/A 01/21/2016    Procedure: HEMORRHOID BANDING;  Surgeon: Danie Binder, MD;  [facility];  Service: Endoscopy;  Laterality: N/A;  . INGUINAL HERNIA REPAIR Right 08/28/2013   Procedure: RIGHT NGUINAL HERNIORRHAPHY WITH MESH;  Surgeon: Jamesetta So, MD;  Location: [facility];  Service: General;  Laterality: Right;  . INSERTION OF MESH N/A 06/09/2013   Procedure: INSERTION OF MESH;  Surgeon: Jamesetta So, MD;  Location: [facility];  Service: General;  Laterality: N/A;  . INSERTION OF MESH Right 08/28/2013   Procedure: INSERTION OF MESH;  Surgeon: Jamesetta So, MD;  Location: [facility];  Service: General;  Laterality: Right;  . PARACENTESIS  Feb 2014   10.7 liters  . TOOTH EXTRACTION Left 12/12/14  . UMBILICAL HERNIA REPAIR N/A 06/09/2013   Procedure: UMBILICAL HERNIORRHAPHY WITH MESH;  Surgeon: Jamesetta So, MD;  Location: [facility];  Service: General;  Laterality: N/A;    Family History Family History  Problem Relation Age of Onset  . Cirrhosis Mother        nash  . COPD Father   . Heart disease Father   . Asthma Unknown   . Heart disease Unknown   . Colon cancer Neg Hx   . Inflammatory bowel disease Neg Hx      Social History  reports that she quit smoking about 22 years ago. Her smoking use included cigarettes. She has a 24.00 pack-year smoking history. She has never used smokeless tobacco. She reports that she does not drink alcohol or use drugs.  Medications  Current Outpatient Medications:  .  aspirin EC 81 MG EC tablet, Take 1 tablet (81 mg total) by mouth daily., Disp: 30 tablet, Rfl: 0 .  atorvastatin (LIPITOR) 40 MG tablet, Take 1 tablet (40 mg total) by mouth daily., Disp: 30 tablet, Rfl: 0 .  bumetanide (BUMEX) 1 MG tablet, TAKE (1) TABLET BY MOUTH (2) TIMES DAILY. (Patient taking differently: Take 1 mg by mouth 2 (two) times daily. ), Disp: 180 tablet, Rfl: 3 .  buPROPion (WELLBUTRIN SR) 100 MG 12 hr tablet, Take 1 tablet (100 mg total) by mouth 2 (two) times daily., Disp: 60 tablet,  Rfl: 5 .  docusate sodium (COLACE) 100 MG capsule, Take 100 mg by mouth 2 (two) times daily. , Disp: , Rfl:  .  escitalopram (LEXAPRO) 20 MG tablet, TAKE (1) TABLET BY MOUTH AT BEDTIME. (Patient taking differently: Take 20 mg by mouth at bedtime. ), Disp: 30 tablet, Rfl: 5 .  fluticasone (FLONASE) 50 MCG/ACT nasal spray, Place 1 spray into both nostrils See admin instructions. Instill 1 spray into each nostril every morning, may also use later in the day as needed for sinus congestion, Disp: , Rfl:  .  hydrocortisone cream 1 %, Apply 1 application topically 2 (two) times daily as needed (eczema in ears)., Disp: , Rfl:  .  linaclotide (LINZESS) 145 MCG CAPS capsule, Take 1 capsule (145 mcg total) by mouth daily before breakfast., Disp: 30 capsule, Rfl: 11 .  loratadine (CLARITIN) 10 MG tablet, Take 10 mg by mouth daily., Disp: , Rfl:  .  losartan (COZAAR) 25 MG tablet, TAKE (1) TABLET BY MOUTH ONCE DAILY., Disp: 30 tablet, Rfl: 0 .  meclizine (ANTIVERT) 25 MG tablet, Take 1 tablet (25 mg total) by mouth 3 (three) times daily as needed for dizziness., Disp: 20 tablet, Rfl: 0 .  Multiple Vitamin (MULTIVITAMIN WITH MINERALS) TABS tablet, Take 1 tablet by mouth daily., Disp: , Rfl:  .  ondansetron (ZOFRAN) 4 MG tablet, Take 1 tablet (4 mg total) by mouth every 8 (eight) hours as needed for nausea or vomiting., Disp: 12 tablet, Rfl: 0 .  Polyvinyl Alcohol-Povidone (REFRESH OP), Place 1 drop into both eyes 3 (three) times daily as needed (dry eyes)., Disp: , Rfl:  .  ranitidine (ZANTAC) 150 MG tablet, TAKE (1) TABLET BY MOUTH TWICE DAILY., Disp: 180 tablet, Rfl: 5 .  Simethicone (GAS-X PO), Take 1 tablet by mouth 2 (two) times daily as needed (bloating/flatulence)., Disp: , Rfl:  .  sodium chloride (OCEAN) 0.65 % nasal spray, Place 1 spray into the nose 2 (two) times daily as needed for congestion. , Disp: , Rfl:  .  spironolactone (ALDACTONE) 100 MG tablet, TAKE (1) TABLET BY MOUTH TWICE DAILY., Disp: 180  tablet, Rfl: 5  Allergies Omeprazole; Other; Phenytoin; Keflex [cephalexin]; and Phenergan [promethazine hcl]    Review of Systems Review of Systems - Oncology ROS negative other than fatigue   Physical Exam  Vitals Wt Readings from Last 3 Encounters:  10/07/17 230 lb 9.6 oz (104.6 kg)  09/02/17 229 lb (103.9 kg)  08/01/17 227 lb 15.3 oz (103.4 kg)   Temp Readings from Last 3 Encounters:  10/07/17 99 F (37.2 C) (Oral)  08/02/17 97.7 F (36.5 C) (Oral)  07/14/17 97.6 F (36.4 C) (Oral)   BP Readings from Last 3 Encounters:  10/07/17 102/67  09/02/17 119/69  08/02/17 111/67   Pulse Readings from Last 3 Encounters:  10/07/17 73  09/02/17 73  08/02/17 (!) 53    Constitutional: Well-developed, well-nourished, and in no distress.   HENT: Head: Normocephalic and atraumatic.  Mouth/Throat: No oropharyngeal exudate. Mucosa moist. Eyes: Pupils are equal, round, and reactive to light. Conjunctivae are normal. No scleral icterus.  Neck: Normal range of motion. Neck supple. No JVD present.  Cardiovascular: Normal rate, regular rhythm and normal heart sounds.  Exam reveals no gallop and no friction rub.   No murmur heard. Pulmonary/Chest: Effort normal and breath sounds normal. No respiratory distress. No wheezes.No rales.  Abdominal: Soft. Bowel sounds are normal. No distension. There is no tenderness. There is no guarding.  Musculoskeletal: No edema or tenderness.  Lymphadenopathy: No cervical, axillary or supraclavicular adenopathy.  Neurological: Alert and oriented to person, place, and time. No cranial nerve deficit.  Skin: Skin is warm and dry. No rash noted. No erythema. No pallor.  Psychiatric: Affect and judgment normal.   Labs Lab on 10/05/2017  Component Date Value Ref Range Status  . Vitamin B-12 10/05/2017 207  180 - 914 pg/mL Final   Comment: (NOTE) This assay is not validated for testing neonatal or myeloproliferative syndrome specimens for Vitamin B12  levels. Performed at [facility], [place] [address] [place], [place] [address]   . Ferritin 10/05/2017 15  11 - 307 ng/mL Final   Performed at [facility], [place] [address], [place], [place] [address]  . Iron 10/05/2017 58  28 - 170 ug/dL Final  . TIBC 10/05/2017 452* 250 - 450 ug/dL Final  . Saturation Ratios 10/05/2017 13  10.4 - 31.8 % Final  . UIBC 10/05/2017 394  ug/dL Final   Performed at [facility], [place] [address] [place], [place] [address]  . Sodium 10/05/2017 136  135 - 145 mmol/L Final  . Potassium 10/05/2017 3.8  3.5 - 5.1 mmol/L Final  . Chloride 10/05/2017 98  98 - 111 mmol/L Final   Please note change in reference range.  . CO2 10/05/2017 26  22 - 32 mmol/L Final  . Glucose, Bld 10/05/2017 86  70 - 99 mg/dL Final   Please note change in reference range.  . BUN 10/05/2017 26* 6 - 20 mg/dL Final   Please note change in reference range.  . Creatinine, Ser 10/05/2017 1.21* 0.44 - 1.00 mg/dL Final  . Calcium 10/05/2017 9.3  8.9 - 10.3 mg/dL Final  . Total Protein 10/05/2017 8.0  6.5 - 8.1 g/dL Final  . Albumin 10/05/2017 4.3  3.5 - 5.0 g/dL Final  . AST 10/05/2017 21  15 - 41 U/L Final  . ALT 10/05/2017 16  0 - 44 U/L Final   Please note change in reference range.  . Alkaline Phosphatase 10/05/2017 77  38 - 126 U/L Final  . Total Bilirubin 10/05/2017 0.6  0.3 - 1.2 mg/dL Final  . GFR calc non Af  Amer 10/05/2017 48* >60 mL/min Final  . GFR calc Af Amer 10/05/2017 55* >60 mL/min Final   Comment: (NOTE) The eGFR has been calculated using the CKD EPI equation. This calculation has not been validated in all clinical situations. eGFR's persistently <60 mL/min signify possible Chronic Kidney Disease.   Georgiann Hahn gap 10/05/2017 12  5 - 15 Final   Performed at [facility], [address], [place], [place] [address]  . WBC 10/05/2017 7.8  4.0 - 10.5 K/uL Final  . RBC 10/05/2017 4.16  3.87 - 5.11 MIL/uL Final  . Hemoglobin 10/05/2017 12.7  12.0 - 15.0 g/dL  Final  . HCT 10/05/2017 38.4  36.0 - 46.0 % Final  . MCV 10/05/2017 92.3  78.0 - 100.0 fL Final  . MCH 10/05/2017 30.5  26.0 - 34.0 pg Final  . MCHC 10/05/2017 33.1  30.0 - 36.0 g/dL Final  . RDW 10/05/2017 13.5  11.5 - 15.5 % Final  . Platelets 10/05/2017 237  150 - 400 K/uL Final  . Neutrophils Relative % 10/05/2017 69  % Final  . Neutro Abs 10/05/2017 5.4  1.7 - 7.7 K/uL Final  . Lymphocytes Relative 10/05/2017 20  % Final  . Lymphs Abs 10/05/2017 1.6  0.7 - 4.0 K/uL Final  . Monocytes Relative 10/05/2017 8  % Final  . Monocytes Absolute 10/05/2017 0.6  0.1 - 1.0 K/uL Final  . Eosinophils Relative 10/05/2017 2  % Final  . Eosinophils Absolute 10/05/2017 0.2  0.0 - 0.7 K/uL Final  . Basophils Relative 10/05/2017 1  % Final  . Basophils Absolute 10/05/2017 0.0  0.0 - 0.1 K/uL Final   Performed at [facility], [address], [place], [place] [address]     Pathology Orders Placed This Encounter  Procedures  . CBC with Differential/Platelet    Standing Status:   Future    Standing Expiration Date:   10/09/2019  . Comprehensive metabolic panel    Standing Status:   Future    Standing Expiration Date:   10/09/2019  . Lactate dehydrogenase    Standing Status:   Future    Standing Expiration Date:   10/09/2019  . Ferritin    Standing Status:   Future    Standing Expiration Date:   10/09/2019       Zoila Shutter MD

## 2017-10-11 ENCOUNTER — Other Ambulatory Visit: Payer: Self-pay | Admitting: Family Medicine

## 2017-10-12 ENCOUNTER — Encounter: Payer: Self-pay | Admitting: Family Medicine

## 2017-10-12 ENCOUNTER — Ambulatory Visit (INDEPENDENT_AMBULATORY_CARE_PROVIDER_SITE_OTHER): Payer: Commercial Managed Care - PPO | Admitting: Family Medicine

## 2017-10-12 VITALS — BP 98/54 | Ht 68.0 in | Wt 226.0 lb

## 2017-10-12 DIAGNOSIS — R931 Abnormal findings on diagnostic imaging of heart and coronary circulation: Secondary | ICD-10-CM

## 2017-10-12 DIAGNOSIS — I10 Essential (primary) hypertension: Secondary | ICD-10-CM

## 2017-10-12 DIAGNOSIS — F3341 Major depressive disorder, recurrent, in partial remission: Secondary | ICD-10-CM | POA: Diagnosis not present

## 2017-10-12 DIAGNOSIS — M797 Fibromyalgia: Secondary | ICD-10-CM

## 2017-10-12 DIAGNOSIS — F5101 Primary insomnia: Secondary | ICD-10-CM

## 2017-10-12 MED ORDER — ESCITALOPRAM OXALATE 20 MG PO TABS: ORAL_TABLET | ORAL | 5 refills | Status: DC

## 2017-10-12 MED ORDER — BUPROPION HCL ER (SR) 100 MG PO TB12: 100.0000 mg | ORAL_TABLET | Freq: Two times a day (BID) | ORAL | 5 refills | Status: DC

## 2017-10-12 MED ORDER — ATORVASTATIN CALCIUM 40 MG PO TABS: ORAL_TABLET | ORAL | 5 refills | Status: DC

## 2017-10-12 MED ORDER — LOSARTAN POTASSIUM 25 MG PO TABS: ORAL_TABLET | ORAL | 5 refills | Status: DC

## 2017-10-12 NOTE — Progress Notes (Signed)
   Subjective:    Patient ID: Deanna Thompson, female    DOB: 1955-05-18, 62 y.o.   MRN: [id number]  HPI Patient is here today to follow up on her chronic health issues.She states she tries to eat healthy and almost has stopped all the fast foods  . She is not exercising. 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   Also Diagnosed with permeant flat foot deformity.  Patient continues to take lipid medication regularly. No obvious side effects from it. Generally does not miss a dose. Prior blood work results are reviewed with patient. Patient continues to work on fat intake in diet  Recent dx of branstem stroke tho negative scan, felt to have one bu the neuro tea  Also hx of vertigo  Has had   lowerejection fraction per dcard at the hosp, and they initated losartan, card did not engender trust    Patient notes ongoing compliance with antidepressant medication. No obvious side effects. Reports does not miss a dose. Overall continues to help depression substantially. No thoughts of homicide or suicide. Would like to maintain medication.   Still receiving counselling and helps some       Review of Systems No headache, no major weight loss or weight gain, no chest pain no back pain abdominal pain no change in bowel habits complete ROS otherwise negative     Objective:   Physical Exam  Alert and oriented, vitals reviewed and stable, NAD ENT-TM's and ext canals WNL bilat via otoscopic exam Soft palate, tonsils and post pharynx WNL via oropharyngeal exam Neck-symmetric, no masses; thyroid nonpalpable and nontender Pulmonary-no tachypnea or accessory muscle use; Clear without wheezes via auscultation Card--no abnrml murmurs, rhythm reg and rate WNL Carotid pulses symmetric, without bruits Neurological exam stable no focal neurological deficits      Assessment & Plan:  #1 impression dizziness,  multifct, with vertigo fro bpvv and also from stroke.  See prior notes.  Recent deep mid brain stroke without evidence on MRI.  Clinical diagnosis via the neurologist.  Overall doing better.  2.  Hyperlipidemia.  Discussed.  With need for medicine to reduce the risk of subsequent strokes discussed.  To maintain Lipitor  3.  Depression.  Ongoing challenging at times.  Currently stable per patient  Greater than 50% of this 25 minute face to face visit was spent in counseling and discussion and coordination of care regarding the above diagnosis/diagnosies   Medications refilled.  Diet exercise discussed.  Follow-up as scheduled

## 2017-10-14 ENCOUNTER — Encounter: Payer: Self-pay | Admitting: Physical Therapy

## 2017-10-14 ENCOUNTER — Ambulatory Visit: Payer: Commercial Managed Care - PPO | Admitting: Physical Therapy

## 2017-10-14 DIAGNOSIS — R42 Dizziness and giddiness: Secondary | ICD-10-CM

## 2017-10-14 DIAGNOSIS — H8113 Benign paroxysmal vertigo, bilateral: Secondary | ICD-10-CM | POA: Diagnosis not present

## 2017-10-14 NOTE — Therapy (Signed)
[facility] [address] [place], [place], [address] Phone: [phone]   Fax:  [phone]  Physical Therapy Treatment  Patient Details  Name: Deanna Thompson MRN: [id number] Date of Birth: 11/28/1955 Referring Provider: Venancio Poisson, NP   Encounter Date: 10/14/2017  PT End of Session - 10/14/17 2016    Visit Number  2    Number of Visits  5    Date for PT Re-Evaluation  11/05/17    Authorization Type  [employer], UMR 30 VL    Authorization Time Period  NA    Authorization - Visit Number  12 includes 10 visits prior episode of PT 5-08/2017    Authorization - Number of Visits  30 pt, ot, slp    PT Start Time  1105    PT Stop Time  1200    PT Time Calculation (min)  55 min    Activity Tolerance  Other (comment) dizziness worse; anxiety worse    Behavior During Therapy  [facility] for tasks assessed/performed       Past Medical History:  Diagnosis Date  . Abdominal hernia   . Abscess of pulp of tooth 12/12/14   left side  . Anxiety   . Arthritis    back  and down flank  right side more than left  . B12 deficiency 06/12/2015  . Brain aneurysm    METAL COIL PREVENTS MRI IMAGING and CLIP  . Cirrhosis ([place])    Presumably alcoholic cirrhosis  . Complication of anesthesia   . Depression   . ETOH abuse    Quit in 07/2011   . Fatigue    due to Sjogren's disease  . Folate deficiency 06/30/2016  . GERD (gastroesophageal reflux disease)   . HTN (hypertension)    no longer medicated, dated on 04/25/12  . IBS (irritable bowel syndrome)   . Iron deficiency anemia due to chronic blood loss   . Myofascial pain   . Panic attack   . PONV (postoperative nausea and vomiting)   . Psoriatic arthritis ([place])   . Right inguinal hernia 08/16/2012  . Seizures ([place]) 1997   1 seizure from coil insertion but no more seizures and no meds for seizures  . Sjogren's disease ([place])   . [place] HEAD 02/03/2007   Qualifier: Diagnosis of   By: Aline Brochure MD, Dorothyann Peng    . 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([place])     Past Surgical History:  Procedure Laterality Date  . AGILE CAPSULE N/A 06/21/2014   Procedure: AGILE CAPSULE;  Surgeon: Danie Binder, MD;  [facility];  Service: Endoscopy;  Laterality: N/A;  0700  . BIOPSY  10/13/2011   SLF: mild gastritis/duodenal polypoid lesion   . CEREBRAL ANEURYSM REPAIR  1997  . COLONOSCOPY  10/13/2011   TIR:[id number] hemorrhoids/varices rectal/lesion in ascending colon(HYPERPLASTIC POLYP)  . COLONOSCOPY N/A 01/17/2013   SLF: 1. Internal hemorrhoids 2. Ascending colon polyps 3.  Rectal varices.   . COLONOSCOPY N/A 08/10/2016   Dr. Oneida Alar: redundant left colon. external and internal hemorhroids. 5-10 year surveillance  . ESOPHAGOGASTRODUODENOSCOPY N/A 06/29/2014   SLF: 1. anemia due to polypoid lesions in teh antrum, and duodenal polyps 2. single duodenum AVM  . FLEXIBLE SIGMOIDOSCOPY N/A 01/21/2016   Procedure: FLEXIBLE SIGMOIDOSCOPY;  Surgeon: Danie Binder, MD;  [facility];  Service: Endoscopy;  Laterality: N/A;  245  . GIVENS CAPSULE STUDY N/A 06/29/2014   Procedure: GIVENS CAPSULE STUDY;  Surgeon: Danie Binder, MD;  [facility];  Service: Endoscopy;  Laterality: N/A;  . GIVENS CAPSULE STUDY N/A 07/13/2014   Procedure: GIVENS CAPSULE STUDY;  Surgeon: Danie Binder, MD;  [facility];  Service: Endoscopy;  Laterality: N/A;  700  . HEMORRHOID BANDING N/A 01/21/2016   Procedure: HEMORRHOID BANDING;  Surgeon: Danie Binder, MD;  [facility];  Service: Endoscopy;  Laterality: N/A;  . INGUINAL HERNIA REPAIR Right 08/28/2013   Procedure: RIGHT NGUINAL HERNIORRHAPHY WITH MESH;  Surgeon: Jamesetta So, MD;  Location: [facility];  Service: General;  Laterality: Right;  . INSERTION OF MESH N/A 06/09/2013   Procedure: INSERTION OF MESH;  Surgeon: Jamesetta So, MD;  Location: [facility];  Service: General;  Laterality: N/A;  . INSERTION OF  MESH Right 08/28/2013   Procedure: INSERTION OF MESH;  Surgeon: Jamesetta So, MD;  Location: [facility];  Service: General;  Laterality: Right;  . PARACENTESIS  Feb 2014   10.7 liters  . TOOTH EXTRACTION Left 12/12/14  . UMBILICAL HERNIA REPAIR N/A 06/09/2013   Procedure: UMBILICAL HERNIORRHAPHY WITH MESH;  Surgeon: Jamesetta So, MD;  Location: [facility];  Service: General;  Laterality: N/A;    There were no vitals filed for this visit.  Subjective Assessment - 10/14/17 1107    Subjective  Had several episodes of quick "whoop" feeling and loss of balance to the left. Felt bad (2 points higher) for about 5 minutes. Then cleared up. Still with multiple questions re: what symptoms are inner ear related vs posterior CVA. Notes she's still having lots of anxiety.     Patient is accompained by:  Family member    Pertinent History  HTN, ETOH abuse, cirrhosis, brain aneurysm, Sjogren's disease, basilar aneurysm clip and coiling, Lt MCA aneurysm    Diagnostic tests  CT scans x 2 (cannot do MRI becasue of the clips & coils);     Patient Stated Goals  stop having dizziness and get back to walking without LOB    Currently in Pain?  No/denies    Pain Onset  More than a month ago             Vestibular Assessment - 10/14/17 2008      Vestibulo-Occular Reflex   Comment  HIT negative bil      Positional Testing   Dix-Hallpike  Dix-Hallpike Right;Dix-Hallpike Left    Sidelying Test  Sidelying Left      Dix-Hallpike Right   Dix-Hallpike Right Duration  0    Dix-Hallpike Right Symptoms  No nystagmus      Dix-Hallpike Left   Dix-Hallpike Left Duration  5 seconds    Dix-Hallpike Left Symptoms  Upbeat, left rotatory nystagmus      Sidelying Left   Sidelying Left Duration  0    Sidelying Left Symptoms  No nystagmus done mid-session after Epley x 2                Vestibular Treatment/Exercise - 10/14/17 0001      Vestibular Treatment/Exercise   Vestibular Treatment Provided  Canalith  Repositioning;Habituation    Canalith Repositioning  Epley Manuever Left    Habituation Exercises  Seated Horizontal Head Turns forward bending in sitting'       EPLEY MANUEVER LEFT   Number of Reps   2    Overall Response   Symptoms Worsened     RESPONSE DETAILS LEFT  no change after 1st Epley with +nystagmus on 1st-2nd steps; prior to  2nd rep, did horizontal head-shaking and vibrator to left mastoid process x 60 seconds; felt worse "but different dizziness" after 2nd Epley      Seated Horizontal Head Turns   Number of Reps   10    Symptom Description   feels environment is slow to follow as she turns her head and then "catches up"        Seated forward bending x 5 reps without increase in symptoms    PT Education - 10/14/17 2014    Education Details  again reviewed reason her symptoms seem to be peripheral (lasting seconds; classic posterior canal nystagmus; quick "slip" of environment when turning her head sounds like hypofunction--although not perceived on HIT or DVA)    Person(s) Educated  Patient    Methods  Explanation    Comprehension  Verbalized understanding       PT Short Term Goals - 10/06/17 1915      PT SHORT TERM GOAL #1   Title  --        PT Long Term Goals - 10/06/17 1751      PT LONG TERM GOAL #1   Title  Patient will be independent with updated HEP upon d/c from PT. (Target for all LTGs 11/05/2017)    Time  4    Period  Weeks    Status  New    Target Date  11/05/17      PT LONG TERM GOAL #2   Title  Patient will have negative positional testing for BPPV.     Time  4    Period  Weeks    Status  New      PT LONG TERM GOAL #3   Title  Patient will be able to verbalize warning signs of CVA (including posterior circulation CVAs) and appropriate steps to take if CVA suspected.     Time  4    Period  Weeks    Status  New      PT LONG TERM GOAL #4   Status  --      PT LONG TERM GOAL #5   Title  --    Time  --    Period  --    Status  --             Plan - 10/14/17 2019    Clinical Impression Statement  Patient with significant anxiety during session today (admitted it got worse the closer she got to arriving at the clinic). 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  Again +left posterior canalithiasis and treated with Epley x 2 with pt feeling "a different dizziness" after second rep. She became anxious and reported she was having a panic attack. Escorted her to a private room and had her lie down, lights dimmed, sound machine and talked her through my belief that what she was feeling is not another CVA, but instead due to BPPV repositioning and anxiety. Patient remained in clinic with call bell in reach and assisted her to the lobby after she was reclined x 1 hour. She did not want to call her husband to come get her, but instead wanted to go sit in her car with [employer] blowing and stated she would NOT drive home if she felt unsafe.     Rehab Potential  Good    Clinical Impairments Affecting Rehab Potential  anxiety, mixed peripheral and central vestibular deficits    PT Frequency  1x /  week    PT Duration  4 weeks    PT Treatment/Interventions  ADLs/Self Care Home Management;Canalith Repostioning;Therapeutic activities;Therapeutic exercise;Balance training;Neuromuscular re-education;Cognitive remediation;Patient/family education;Passive range of motion;Vestibular;Visual/perceptual remediation/compensation;Biofeedback    PT Next Visit Plan  check for left posterior BPPV and treat accordingly (??cupulolithiasis, although had latency during Hallpike); did she do Brandt-Daroff?? balance activities    PT Home Exercise Plan  from recent PT episode of care:  VORx1; corner on pillow, feet partial tandem, EO head turns, EC head neutral; walking diagonal head turns; eye tracking horiz and vertical; tandem walk; backward walk; SLS    Consulted and Agree with Plan of Care  Patient        Patient will benefit from skilled therapeutic intervention in order to improve the following deficits and impairments:  Abnormal gait, Decreased activity tolerance, Decreased balance, Decreased cognition, Decreased mobility, Decreased knowledge of use of DME, Dizziness, Impaired vision/preception, Difficulty walking  Visit Diagnosis: BPPV (benign paroxysmal positional vertigo), bilateral  Dizziness and giddiness     Problem List Patient Active Problem List   Diagnosis Date Noted  . Benign paroxysmal positional vertigo   . 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 Essential hypertension 07/31/2017  . GERD (gastroesophageal reflux disease) 07/31/2017  . Alcohol abuse 07/31/2017  . Brain aneurysm 07/31/2017  . CKD (chronic kidney disease), stage III ([place]) 07/31/2017  . LBBB (left bundle branch block) 07/31/2017  . Dizziness 07/31/2017  . Nausea & vomiting 07/31/2017  . Folate deficiency 06/30/2016  . Constipation 04/01/2016  . 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 Hemorrhoids, internal, with bleeding 01/08/2016  . B12 deficiency 06/12/2015  . Psoriatic arthritis ([place]) 04/15/2015  . Nutritional anemia 09/19/2014  . Iron deficiency anemia due to chronic blood loss   . Insomnia 03/17/2014  . Depression 02/18/2014  . Labial swelling 08/23/2012  . Rash and nonspecific skin eruption 06/30/2012  . Colon polyps 11/18/2011  . Cirrhosis ([place]) 08/19/2011    Rexanne Mano, PT 10/14/2017, 8:27 PM  [facility] [address] [place], [place], [address] Phone: [phone]   Fax:  [phone]  Name: Diesha Rostad MRN: [id number] Date of Birth: 08-17-1955

## 2017-10-15 ENCOUNTER — Encounter: Payer: Commercial Managed Care - PPO | Admitting: Physical Therapy

## 2017-10-18 ENCOUNTER — Encounter: Payer: Self-pay | Admitting: Physical Therapy

## 2017-10-18 ENCOUNTER — Ambulatory Visit: Payer: Commercial Managed Care - PPO | Admitting: Physical Therapy

## 2017-10-18 DIAGNOSIS — R42 Dizziness and giddiness: Secondary | ICD-10-CM

## 2017-10-18 DIAGNOSIS — H8113 Benign paroxysmal vertigo, bilateral: Secondary | ICD-10-CM

## 2017-10-18 NOTE — Patient Instructions (Addendum)
  Gaze Stabilization: Tip Card  1.Target must remain in focus, not blurry, and appear stationary while head is in motion. 2.Perform exercises with small head movements (45 to either side of midline). 3.Increase speed of head motion so long as target is in focus. 4.If you wear eyeglasses, be sure you can see target through lens (therapist will give specific instructions for bifocal / progressive lenses). 5.These exercises may provoke dizziness or nausea. Work through these symptoms. If too dizzy, slow head movement slightly. Rest between each exercise. 6.Exercises demand concentration; avoid distractions.  Copyright  VHI. All rights reserved.     Special Instructions: Exercises may bring on mild to moderate symptoms of the cluster that resolve within 30 minutes of completing exercises. If symptoms are lasting longer than 30 minutes, modify your exercises by:  >decreasing the # of times you complete each activity >ensuring your symptoms return to baseline before moving onto the next exercise >dividing up exercises so you do not do them all in one session, but multiple short sessions throughout the day >doing them once a day until symptoms improve    Gaze Stabilization - Tip Card  For safety, perform standing exercises close to a counter, wall, corner, or next to someone.   Gaze Stabilization - Standing Feet Apart   Feet shoulder width apart, keeping eyes on target on wall 3 feet away, tilt head down slightly (but make sure it is not double-vision) and move head side to side for 30 seconds (within range of motion that does not cause the letter to blur). Repeat 2 more times up to 30 sec *Work up to tolerating 60 seconds, as able. Do 2-3 sessions per day.   Copyright  VHI. All rights reserved.

## 2017-10-18 NOTE — Therapy (Addendum)
[facility] [address] [place], [place], [address] Phone: [phone]   Fax:  [phone]  Physical Therapy Treatment  Patient Details  Name: Deanna Thompson MRN: [id number] Date of Birth: November 13, 1955 Referring Provider: Venancio Poisson, NP   Encounter Date: 10/18/2017  PT End of Session - 10/18/17 1948    Visit Number  3   Number of Visits  5    Date for PT Re-Evaluation  11/05/17    Authorization Type  [employer], UMR 30 VL    Authorization Time Period  NA    Authorization - Visit Number  13 includes 10 visits prior episode of PT 5-08/2017    Authorization - Number of Visits  30 pt, ot, slp    PT Start Time  1107    PT Stop Time  1148    PT Time Calculation (min)  41 min    Activity Tolerance  Patient tolerated treatment well    Behavior During Therapy  [facility] for tasks assessed/performed       Past Medical History:  Diagnosis Date  . Abdominal hernia   . Abscess of pulp of tooth 12/12/14   left side  . Anxiety   . Arthritis    back  and down flank  right side more than left  . B12 deficiency 06/12/2015  . Brain aneurysm    METAL COIL PREVENTS MRI IMAGING and CLIP  . Cirrhosis ([place])    Presumably alcoholic cirrhosis  . Complication of anesthesia   . Depression   . ETOH abuse    Quit in 07/2011   . Fatigue    due to Sjogren's disease  . Folate deficiency 06/30/2016  . GERD (gastroesophageal reflux disease)   . HTN (hypertension)    no longer medicated, dated on 04/25/12  . IBS (irritable bowel syndrome)   . Iron deficiency anemia due to chronic blood loss   . Myofascial pain   . Panic attack   . PONV (postoperative nausea and vomiting)   . Psoriatic arthritis ([place])   . Right inguinal hernia 08/16/2012  . Seizures ([place]) 1997   1 seizure from coil insertion but no more seizures and no meds for seizures  . Sjogren's disease ([place])   . [place] HEAD 02/03/2007   Qualifier: Diagnosis of  By: Aline Brochure  MD, Dorothyann Peng    . 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([place])     Past Surgical History:  Procedure Laterality Date  . AGILE CAPSULE N/A 06/21/2014   Procedure: AGILE CAPSULE;  Surgeon: Danie Binder, MD;  [facility];  Service: Endoscopy;  Laterality: N/A;  0700  . BIOPSY  10/13/2011   SLF: mild gastritis/duodenal polypoid lesion   . CEREBRAL ANEURYSM REPAIR  1997  . COLONOSCOPY  10/13/2011   FOY:DXAJOINO hemorrhoids/varices rectal/lesion in ascending colon(HYPERPLASTIC POLYP)  . COLONOSCOPY N/A 01/17/2013   SLF: 1. Internal hemorrhoids 2. Ascending colon polyps 3.  Rectal varices.   . COLONOSCOPY N/A 08/10/2016   Dr. Oneida Alar: redundant left colon. external and internal hemorhroids. 5-10 year surveillance  . ESOPHAGOGASTRODUODENOSCOPY N/A 06/29/2014   SLF: 1. anemia due to polypoid lesions in teh antrum, and duodenal polyps 2. single duodenum AVM  . FLEXIBLE SIGMOIDOSCOPY N/A 01/21/2016   Procedure: FLEXIBLE SIGMOIDOSCOPY;  Surgeon: Danie Binder, MD;  [facility];  Service: Endoscopy;  Laterality: N/A;  245  . GIVENS CAPSULE STUDY N/A 06/29/2014   Procedure: GIVENS CAPSULE STUDY;  Surgeon: Danie Binder, MD;  [facility];  Service: Endoscopy;  Laterality: N/A;  . GIVENS CAPSULE STUDY N/A 07/13/2014   Procedure: GIVENS CAPSULE STUDY;  Surgeon: Danie Binder, MD;  [facility];  Service: Endoscopy;  Laterality: N/A;  700  . HEMORRHOID BANDING N/A 01/21/2016   Procedure: HEMORRHOID BANDING;  Surgeon: Danie Binder, MD;  [facility];  Service: Endoscopy;  Laterality: N/A;  . INGUINAL HERNIA REPAIR Right 08/28/2013   Procedure: RIGHT NGUINAL HERNIORRHAPHY WITH MESH;  Surgeon: Jamesetta So, MD;  Location: [facility];  Service: General;  Laterality: Right;  . INSERTION OF MESH N/A 06/09/2013   Procedure: INSERTION OF MESH;  Surgeon: Jamesetta So, MD;  Location: [facility];  Service: General;  Laterality: N/A;  . INSERTION OF MESH Right  08/28/2013   Procedure: INSERTION OF MESH;  Surgeon: Jamesetta So, MD;  Location: [facility];  Service: General;  Laterality: Right;  . PARACENTESIS  Feb 2014   10.7 liters  . TOOTH EXTRACTION Left 12/12/14  . UMBILICAL HERNIA REPAIR N/A 06/09/2013   Procedure: UMBILICAL HERNIORRHAPHY WITH MESH;  Surgeon: Jamesetta So, MD;  Location: [facility];  Service: General;  Laterality: N/A;    There were no vitals filed for this visit.  Subjective Assessment - 10/18/17 1110    Subjective  After last session, she was able to drive herself home after resting in treatment room. She stopped at [place] on the way home, looked down at something, and everything got worse. Things were spinning for hour half. (Even had to drive home 1 mile while feeling this way). Didn't stop until she layed down on the sofa. Since then feeling discomfort (stuffy head, frontal area, heavy head). Didn't do her Brandt-Daroff first couple of days, but then did and each time laid to right it felt like it was going to start spinning and did not; to the left nothing. While doing Brandt-Daroff she stayed on her right at least 30-60 seconds each time and feeling never went away.     Patient is accompained by:  Family member    Pertinent History  HTN, ETOH abuse, cirrhosis, brain aneurysm, Sjogren's disease, basilar aneurysm clip and coiling, Lt MCA aneurysm    Diagnostic tests  CT scans x 2 (cannot do MRI becasue of the clips & coils);     Patient Stated Goals  stop having dizziness and get back to walking without LOB    Currently in Pain?  No/denies    Pain Onset  More than a month ago             Vestibular Assessment - 10/18/17 1928      Symptom Behavior   Type of Dizziness  "Funny feeling in head"    Frequency of Dizziness  daily    Duration of Dizziness  >1 minute when doing Brandt-Daroff    Aggravating Factors  Activity in general looking down    Relieving Factors  Rest      Positional Testing   Dix-Hallpike   Dix-Hallpike Left      Dix-Hallpike Left   Dix-Hallpike Left Duration  10    Dix-Hallpike Left Symptoms  Downbeat, left rotatory nystagmus                Vestibular Treatment/Exercise - 10/18/17 0001      Vestibular Treatment/Exercise   Vestibular Treatment Provided  Canalith Repositioning;Gaze    Canalith Repositioning  Epley Manuever Left;Comment semi-demont       EPLEY MANUEVER LEFT  Number of Reps   1    Overall Response   No change     RESPONSE DETAILS LEFT  step one nystagmus/spinning; step 2 very brief spinning, step 3 no symptoms, step 4 no spinning (funny feeling in head)      OTHER   Comment  for ?apogeotropic left posterior canal BPPV, semi-demont maneuver to right to treat lt posterior canal (rt head turn 45*, lie on rt side x 2 minutes, then quick return to sit). Pt with brief vertigo both steps (no nystagmus seen). Performed demi-Semont to left, pt asymptomatic step 1, slight "shift" in vision step 2 but not spinning      X1 Viewing Horizontal   Foot Position  standing behind chair    Time  -- 30 sec    Reps  2    Comments  pt had to do more narrow head turns as she would get increased blurring of target with peripheral vision, no increased symptoms, eyes conjugate; no vertical x1 due to pt's continued diplopia when eyes level and neck flexes         Balance Exercises - 10/18/17 1942      Balance Exercises: Standing   Gait with Head Turns  Forward 80 feet left and right, up/down asymptomatic (after repositi        PT Education - 10/18/17 1948    Education Details  Patient has described when turning her head, it takes a second when head stops for vision to catch up. Asymptomatic today with VORx1 horizontal, however does not have a follow-up appt for 2 weeks. Educated if this feeling returns, to begin doing VORx1.    Person(s) Educated  Patient    Methods  Explanation;Demonstration;Handout;Verbal cues    Comprehension  Verbalized  understanding;Returned demonstration;Verbal cues required;Need further instruction          PT Long Term Goals - 10/06/17 1751      PT LONG TERM GOAL #1   Title  Patient will be independent with updated HEP upon d/c from PT. (Target for all LTGs 11/05/2017)    Time  4    Period  Weeks    Status  New    Target Date  11/05/17      PT LONG TERM GOAL #2   Title  Patient will have negative positional testing for BPPV.     Time  4    Period  Weeks    Status  New      PT LONG TERM GOAL #3   Title  Patient will be able to verbalize warning signs of CVA (including posterior circulation CVAs) and appropriate steps to take if CVA suspected.     Time  4    Period  Weeks    Status  New      PT LONG TERM GOAL #4   Status  --      PT LONG TERM GOAL #5   Title  --    Time  --    Period  --    Status  --            Plan - 10/18/17 1951    Clinical Impression Statement  Patient again with nystagmus with assessing Lt posterior canal, however appeared to be apogeotropic BPPV. Performed demi-semont with improvement in symptoms. No imbalance noted with ambulation with head turns (horizontal and vertical) although wide BOS persisted. Assessed for ?hypofunction with pt asymptomatic in clinic, however her description of symptoms very consistent with hypofunction. Given  HEP in case these specific symptoms return. Patient will return in 2 weeks unless an appointment comes open for next week.     Rehab Potential  Good    Clinical Impairments Affecting Rehab Potential  anxiety, mixed peripheral and central vestibular deficits    PT Frequency  1x / week    PT Duration  4 weeks    PT Treatment/Interventions  ADLs/Self Care Home Management;Canalith Repostioning;Therapeutic activities;Therapeutic exercise;Balance training;Neuromuscular re-education;Cognitive remediation;Patient/family education;Passive range of motion;Vestibular;Visual/perceptual remediation/compensation;Biofeedback    PT Next Visit  Plan  did she need to do VORx1 exercise and how did it go?; check for left posterior BPPV and treat accordingly; check balance exercises from HEP below and upgrade as needed    PT Home Exercise Plan  from recent PT episode of care:  VORx1; corner on pillow, feet partial tandem, EO head turns, EC head neutral; walking diagonal head turns; eye tracking horiz and vertical; tandem walk; backward walk; SLS    Consulted and Agree with Plan of Care  Patient       Patient will benefit from skilled therapeutic intervention in order to improve the following deficits and impairments:  Abnormal gait, Decreased activity tolerance, Decreased balance, Decreased cognition, Decreased mobility, Decreased knowledge of use of DME, Dizziness, Impaired vision/preception, Difficulty walking  Visit Diagnosis: BPPV (benign paroxysmal positional vertigo), bilateral  Dizziness and giddiness     Problem List Patient Active Problem List   Diagnosis Date Noted  . Benign paroxysmal positional vertigo   . 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 Essential hypertension 07/31/2017  . GERD (gastroesophageal reflux disease) 07/31/2017  . Alcohol abuse 07/31/2017  . Brain aneurysm 07/31/2017  . CKD (chronic kidney disease), stage III ([place]) 07/31/2017  . LBBB (left bundle branch block) 07/31/2017  . Dizziness 07/31/2017  . Nausea & vomiting 07/31/2017  . Folate deficiency 06/30/2016  . Constipation 04/01/2016  . 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 Hemorrhoids, internal, with bleeding 01/08/2016  . B12 deficiency 06/12/2015  . Psoriatic arthritis ([place]) 04/15/2015  . Nutritional anemia 09/19/2014  . Iron deficiency anemia due to chronic blood loss   . Insomnia 03/17/2014  . Depression 02/18/2014  . Labial swelling 08/23/2012  . Rash and nonspecific skin eruption 06/30/2012  . Colon polyps 11/18/2011  . Cirrhosis ([place]) 08/19/2011    Rexanne Mano, PT 10/18/2017, 7:57 PM  [facility] [address] [place], [place], [address] Phone: [phone]   Fax:  [phone]  Name: Deanna Thompson MRN: [id number] Date of Birth: 23-Oct-1955

## 2017-10-19 ENCOUNTER — Encounter (INDEPENDENT_AMBULATORY_CARE_PROVIDER_SITE_OTHER): Payer: Self-pay

## 2017-10-19 ENCOUNTER — Encounter: Payer: Commercial Managed Care - PPO | Admitting: Physical Therapy

## 2017-10-19 ENCOUNTER — Encounter: Payer: Self-pay | Admitting: Family Medicine

## 2017-10-25 ENCOUNTER — Emergency Department (HOSPITAL_COMMUNITY)
Admission: EM | Admit: 2017-10-25 | Discharge: 2017-10-25 | Disposition: A | Discharge status: Hospice | Payer: Commercial Managed Care - PPO | Attending: Emergency Medicine | Admitting: Emergency Medicine

## 2017-10-25 ENCOUNTER — Ambulatory Visit: Payer: Commercial Managed Care - PPO | Admitting: Physical Therapy

## 2017-10-25 ENCOUNTER — Encounter: Payer: Self-pay | Admitting: Physical Therapy

## 2017-10-25 ENCOUNTER — Encounter (HOSPITAL_COMMUNITY): Payer: Self-pay

## 2017-10-25 DIAGNOSIS — R29818 Other symptoms and signs involving the nervous system: Secondary | ICD-10-CM

## 2017-10-25 DIAGNOSIS — Z79899 Other long term (current) drug therapy: Secondary | ICD-10-CM | POA: Diagnosis not present

## 2017-10-25 DIAGNOSIS — T782XXA Anaphylactic shock, unspecified, initial encounter: Secondary | ICD-10-CM | POA: Diagnosis not present

## 2017-10-25 DIAGNOSIS — H8113 Benign paroxysmal vertigo, bilateral: Secondary | ICD-10-CM | POA: Diagnosis not present

## 2017-10-25 DIAGNOSIS — I129 Hypertensive chronic kidney disease with stage 1 through stage 4 chronic kidney disease, or unspecified chronic kidney disease: Secondary | ICD-10-CM | POA: Diagnosis not present

## 2017-10-25 DIAGNOSIS — R0602 Shortness of breath: Secondary | ICD-10-CM | POA: Diagnosis present

## 2017-10-25 DIAGNOSIS — N183 Chronic kidney disease, stage 3 (moderate): Secondary | ICD-10-CM | POA: Insufficient documentation

## 2017-10-25 DIAGNOSIS — Z7982 Long term (current) use of aspirin: Secondary | ICD-10-CM | POA: Diagnosis not present

## 2017-10-25 DIAGNOSIS — R262 Difficulty in walking, not elsewhere classified: Secondary | ICD-10-CM

## 2017-10-25 DIAGNOSIS — R42 Dizziness and giddiness: Secondary | ICD-10-CM

## 2017-10-25 DIAGNOSIS — Z87891 Personal history of nicotine dependence: Secondary | ICD-10-CM | POA: Diagnosis not present

## 2017-10-25 LAB — I-STAT TROPONIN, ED
TROPONIN I, POC: 0 ng/mL (ref 0.00–0.08)
Troponin i, poc: 0.01 ng/mL (ref 0.00–0.08)

## 2017-10-25 LAB — COMPREHENSIVE METABOLIC PANEL
ALBUMIN: 3.7 g/dL (ref 3.5–5.0)
ALK PHOS: 64 U/L (ref 38–126)
ALT: 17 U/L (ref 0–44)
AST: 24 U/L (ref 15–41)
Anion gap: 9 (ref 5–15)
BILIRUBIN TOTAL: 0.4 mg/dL (ref 0.3–1.2)
BUN: 22 mg/dL — AB (ref 6–20)
CALCIUM: 8.7 mg/dL — AB (ref 8.9–10.3)
CO2: 27 mmol/L (ref 22–32)
Chloride: 102 mmol/L (ref 98–111)
Creatinine, Ser: 1.14 mg/dL — ABNORMAL HIGH (ref 0.44–1.00)
GFR calc Af Amer: 59 mL/min — ABNORMAL LOW (ref >60)
GFR calc non Af Amer: 51 mL/min — ABNORMAL LOW (ref >60)
Glucose, Bld: 154 mg/dL — ABNORMAL HIGH (ref 70–99)
Potassium: 3.3 mmol/L — ABNORMAL LOW (ref 3.5–5.1)
SODIUM: 138 mmol/L (ref 135–145)
TOTAL PROTEIN: 6.7 g/dL (ref 6.5–8.1)

## 2017-10-25 LAB — CBC WITH DIFFERENTIAL/PLATELET
BASOS ABS: 0 10*3/uL (ref 0.0–0.1)
BASOS PCT: 0 %
EOS ABS: 0 10*3/uL (ref 0.0–0.7)
Eosinophils Relative: 0 %
HEMATOCRIT: 34.5 % — AB (ref 36.0–46.0)
HEMOGLOBIN: 11.4 g/dL — AB (ref 12.0–15.0)
LYMPHS PCT: 11 %
Lymphs Abs: 1 10*3/uL (ref 0.7–4.0)
MCH: 30.3 pg (ref 26.0–34.0)
MCHC: 33 g/dL (ref 30.0–36.0)
MCV: 91.8 fL (ref 78.0–100.0)
Monocytes Absolute: 0.4 10*3/uL (ref 0.1–1.0)
Monocytes Relative: 4 %
Neutro Abs: 8.1 10*3/uL — ABNORMAL HIGH (ref 1.7–7.7)
Neutrophils Relative %: 85 %
Platelets: 258 10*3/uL (ref 150–400)
RBC: 3.76 MIL/uL — AB (ref 3.87–5.11)
RDW: 13.7 % (ref 11.5–15.5)
WBC: 9.6 10*3/uL (ref 4.0–10.5)

## 2017-10-25 MED ORDER — EPINEPHRINE 0.3 MG/0.3ML IJ SOAJ: 0.3000 mg | Freq: Once | INTRAMUSCULAR | 0 refills | Status: AC

## 2017-10-25 MED ORDER — EPINEPHRINE 0.3 MG/0.3ML IJ SOAJ: 0.3000 mg | Freq: Once | INTRAMUSCULAR | 0 refills | Status: DC

## 2017-10-25 MED ORDER — PREDNISONE 20 MG PO TABS: ORAL_TABLET | ORAL | 0 refills | Status: DC

## 2017-10-25 MED ORDER — HYDROXYZINE HCL 50 MG/ML IM SOLN
50.0000 mg | Freq: Once | INTRAMUSCULAR | Status: AC
  Administered 2017-10-25: 50 mg via INTRAMUSCULAR
  Filled 2017-10-25: qty 1

## 2017-10-25 MED ORDER — SODIUM CHLORIDE 0.9 % IV BOLUS
1000.0000 mL | Freq: Once | INTRAVENOUS | Status: AC
  Administered 2017-10-25: 1000 mL via INTRAVENOUS

## 2017-10-25 NOTE — ED Notes (Signed)
Bed: [facility] Expected date:  Expected time:  Means of arrival:  Comments: EMS-allergic reaction

## 2017-10-25 NOTE — ED Provider Notes (Signed)
[facility] DEPT Provider Note   CSN: [id number] Arrival date & time: 10/25/17  1513     History   Chief Complaint Chief Complaint  Patient presents with  . Allergic Reaction    HPI Deanna Thompson is a 62 y.o. female history of psoriatic arthritis, IBS, presenting with anaphylaxis. She states that she had her first infusion of orencia around 1:30 PM today.  About 15 minutes after the infusion started, she noticed that she has some trouble breathing.  She broke out in hives and still has some chest pain and felt like her throat was closing up.  Patient was given 25 mg of Benadryl and 125 mg of Solu-Medro as well as 2 puffs of pro-air and 4 mg of Zofran in the office.  EMS got there and gave her 0.3 mg of epi IM as well as 25 mg of Benadryl.  Patient states that she felt better now and her chest pain has resolved.  She also felt that her throat is no longer tight.  However she still has a rash that is itchy.  Patient denies any other new medicines.   The history is provided by the patient.    Past Medical History:  Diagnosis Date  . Abdominal hernia   . Abscess of pulp of tooth 12/12/14   left side  . Anxiety   . Arthritis    back  and down flank  right side more than left  . B12 deficiency 06/12/2015  . Brain aneurysm    METAL COIL PREVENTS MRI IMAGING and CLIP  . Cirrhosis ([place])    Presumably alcoholic cirrhosis  . Complication of anesthesia   . Depression   . ETOH abuse    Quit in 07/2011   . Fatigue    due to Sjogren's disease  . Folate deficiency 06/30/2016  . GERD (gastroesophageal reflux disease)   . HTN (hypertension)    no longer medicated, dated on 04/25/12  . IBS (irritable bowel syndrome)   . Iron deficiency anemia due to chronic blood loss   . Myofascial pain   . Panic attack   . PONV (postoperative nausea and vomiting)   . Psoriatic arthritis ([place])   . Right inguinal hernia 08/16/2012  . Seizures ([place]) 1997   1 seizure from  coil insertion but no more seizures and no meds for seizures  . Sjogren's disease ([place])   . [place] HEAD 02/03/2007   Qualifier: Diagnosis of  By: Aline Brochure MD, Dorothyann Peng    . 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[facility])     Patient Active Problem List   Diagnosis Date Noted  . Benign paroxysmal positional vertigo   . 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 Essential hypertension 07/31/2017  . GERD (gastroesophageal reflux disease) 07/31/2017  . Alcohol abuse 07/31/2017  . Brain aneurysm 07/31/2017  . CKD (chronic kidney disease), stage III ([place]) 07/31/2017  . LBBB (left bundle branch block) 07/31/2017  . Dizziness 07/31/2017  . Nausea & vomiting 07/31/2017  . Folate deficiency 06/30/2016  . Constipation 04/01/2016  . 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 Hemorrhoids, internal, with bleeding 01/08/2016  . B12 deficiency 06/12/2015  . Psoriatic arthritis ([place]) 04/15/2015  . Nutritional anemia 09/19/2014  . Iron deficiency anemia due to chronic blood loss   . Insomnia 03/17/2014  . Depression 02/18/2014  . Labial swelling 08/23/2012  . Rash and nonspecific skin eruption 06/30/2012  . Colon polyps 11/18/2011  . Cirrhosis ([place]) 08/19/2011    Past Surgical History:  Procedure Laterality Date  .  AGILE CAPSULE N/A 06/21/2014   Procedure: AGILE CAPSULE;  Surgeon: Danie Binder, MD;  Location[facility];  Service: Endoscopy;  Laterality: N/A;  0700  . BIOPSY  10/13/2011   SLF: mild gastritis/duodenal polypoid lesion   . CEREBRAL ANEURYSM REPAIR  1997  . COLONOSCOPY  10/13/2011   GYB:WLSLHTDS hemorrhoids/varices rectal/lesion in ascending colon(HYPERPLASTIC POLYP)  . COLONOSCOPY N/A 01/17/2013   SLF: 1. Internal hemorrhoids 2. Ascending colon polyps 3.  Rectal varices.   . COLONOSCOPY N/A 08/10/2016   Dr. Oneida Alar: redundant left colon. external and internal hemorhroids. 5-10 year surveillance  . ESOPHAGOGASTRODUODENOSCOPY N/A 06/29/2014   SLF: 1. anemia due to polypoid lesions in  teh antrum, and duodenal polyps 2. single duodenum AVM  . FLEXIBLE SIGMOIDOSCOPY N/A 01/21/2016   Procedure: FLEXIBLE SIGMOIDOSCOPY;  Surgeon: Danie Binder, MD;  Location: [facility];  Service: Endoscopy;  Laterality: N/A;  245  . GIVENS CAPSULE STUDY N/A 06/29/2014   Procedure: GIVENS CAPSULE STUDY;  Surgeon: Danie Binder, MD;  Location: [facility];  Service: Endoscopy;  Laterality: N/A;  . GIVENS CAPSULE STUDY N/A 07/13/2014   Procedure: GIVENS CAPSULE STUDY;  Surgeon: Danie Binder, MD;  [facility];  Service: Endoscopy;  Laterality: N/A;  700  . HEMORRHOID BANDING N/A 01/21/2016   Procedure: HEMORRHOID BANDING;  Surgeon: Danie Binder, MD;  Location: [facility];  Service: Endoscopy;  Laterality: N/A;  . INGUINAL HERNIA REPAIR Right 08/28/2013   Procedure: RIGHT NGUINAL HERNIORRHAPHY WITH MESH;  Surgeon: Jamesetta So, MD;  Location: [facility] ORS;  Service: General;  Laterality: Right;  . INSERTION OF MESH N/A 06/09/2013   Procedure: INSERTION OF MESH;  Surgeon: Jamesetta So, MD;  Location: [facility] ORS;  Service: General;  Laterality: N/A;  . INSERTION OF MESH Right 08/28/2013   Procedure: INSERTION OF MESH;  Surgeon: Jamesetta So, MD;  Location: [facility] ORS;  Service: General;  Laterality: Right;  . PARACENTESIS  Feb 2014   10.7 liters  . TOOTH EXTRACTION Left 12/12/14  . UMBILICAL HERNIA REPAIR N/A 06/09/2013   Procedure: UMBILICAL HERNIORRHAPHY WITH MESH;  Surgeon: Jamesetta So, MD;  Location: [facility] ORS;  Service: General;  Laterality: N/A;     OB History    Gravida      Para      Term      Preterm      AB      Living  0     SAB      TAB      Ectopic      Multiple      Live Births               Home Medications    Prior to Admission medications   Medication Sig Start Date End Date Taking? Authorizing Provider  aspirin EC 81 MG EC tablet Take 1 tablet (81 mg total) by mouth daily. 08/03/17  Yes Patrecia Pour, MD  atorvastatin (LIPITOR) 40 MG tablet  TAKE (1) TABLET BY MOUTH ONCE DAILY. 10/12/17  Yes Mikey Kirschner, MD  bumetanide (BUMEX) 1 MG tablet TAKE (1) TABLET BY MOUTH (2) TIMES DAILY. Patient taking differently: Take 1 mg by mouth 2 (two) times daily.  04/01/16  Yes Fields, Sandi L, MD  buPROPion (WELLBUTRIN SR) 100 MG 12 hr tablet Take 1 tablet (100 mg total) by mouth 2 (two) times daily. 10/12/17  Yes Mikey Kirschner, MD  docusate sodium (COLACE) 100 MG capsule  Take 100 mg by mouth daily.    Yes [provider]  escitalopram (LEXAPRO) 20 MG tablet TAKE (1) TABLET BY MOUTH AT BEDTIME. 10/12/17  Yes Mikey Kirschner, MD  fluticasone (FLONASE) 50 MCG/ACT nasal spray Place 1 spray into both nostrils See admin instructions. Instill 1 spray into each nostril every morning, may also use later in the day as needed for sinus congestion   Yes [provider]  hydrocortisone cream 1 % Apply 1 application topically 2 (two) times daily as needed (eczema in ears).   Yes [provider]  Lifitegrast Shirley Friar) 5 % SOLN Place 1 drop into both eyes 2 (two) times daily.   Yes [provider]  linaclotide Rolan Lipa) 145 MCG CAPS capsule Take 1 capsule (145 mcg total) by mouth daily before breakfast. 02/24/17  Yes Mahala Menghini, PA-C  loratadine (CLARITIN) 10 MG tablet Take 10 mg by mouth daily.   Yes [provider]  losartan (COZAAR) 25 MG tablet TAKE (1) TABLET BY MOUTH ONCE DAILY. 10/12/17  Yes Mikey Kirschner, MD  Multiple Vitamin (MULTIVITAMIN WITH MINERALS) TABS tablet Take 1 tablet by mouth daily.   Yes [provider]  ranitidine (ZANTAC) 150 MG tablet TAKE (1) TABLET BY MOUTH TWICE DAILY. 07/05/17  Yes Mahala Menghini, PA-C  spironolactone (ALDACTONE) 100 MG tablet TAKE (1) TABLET BY MOUTH TWICE DAILY. 07/05/17  Yes Mahala Menghini, PA-C  meclizine (ANTIVERT) 25 MG tablet Take 1 tablet (25 mg total) by mouth 3 (three) times daily as needed for dizziness. 08/02/17   Patrecia Pour, MD  ondansetron  (ZOFRAN) 4 MG tablet Take 1 tablet (4 mg total) by mouth every 8 (eight) hours as needed for nausea or vomiting. 08/02/17   Patrecia Pour, MD  Polyvinyl Alcohol-Povidone (REFRESH OP) Place 1 drop into both eyes 3 (three) times daily as needed (dry eyes).    [provider]  Simethicone (GAS-X PO) Take 1 tablet by mouth 2 (two) times daily as needed (bloating/flatulence).    [provider]  sodium chloride (OCEAN) 0.65 % nasal spray Place 1 spray into the nose 2 (two) times daily as needed for congestion.     [provider]    Family History Family History  Problem Relation Age of Onset  . Cirrhosis Mother        nash  . COPD Father   . Heart disease Father   . Asthma Unknown   . Heart disease Unknown   . Colon cancer Neg Hx   . Inflammatory bowel disease Neg Hx     Social History Social History   Tobacco Use  . Smoking status: Former Smoker    Packs/day: 2.00    Years: 12.00    Pack years: 24.00    Types: Cigarettes    Last attempt to quit: 03/31/1995    Years since quitting: 22.5  . Smokeless tobacco: Never Used  . Tobacco comment: quit in 1997  Substance Use Topics  . Alcohol use: No    Comment: Bottle of wine a day for years, but quit in 07/2011  . Drug use: No     Allergies   Omeprazole; Orencia [abatacept]; Other; Phenytoin; Keflex [cephalexin]; and Phenergan [promethazine hcl]   Review of Systems Review of Systems  Respiratory: Positive for chest tightness and shortness of breath.   Skin: Positive for rash.  All other systems reviewed and are negative.    Physical Exam Updated Vital Signs BP 138/65  Pulse 76   Temp 98.3 F (36.8 C) (Oral)   Resp 12   Ht 5\' 8"  (1.727 m)   Wt 103.4 kg (228 lb)   SpO2 99%   BMI 34.67 kg/m   Physical Exam  Constitutional: She is oriented to person, place, and time. She appears well-developed.  HENT:  Head: Normocephalic.  OP clear   Eyes: Pupils are equal, round, and reactive to light.  Conjunctivae and EOM are normal.  Neck: Normal range of motion. Neck supple.  No stridor   Cardiovascular: Normal rate, regular rhythm and normal heart sounds.  Pulmonary/Chest: Effort normal and breath sounds normal. No stridor. No respiratory distress. She has no wheezes.  Abdominal: Soft. Bowel sounds are normal. She exhibits no distension.  Musculoskeletal: Normal range of motion.  Neurological: She is alert and oriented to person, place, and time.  Skin: Skin is warm.  Diffuse urticaria mostly on L forearm but also involving back, lower abdomen. No petechiae.   Psychiatric: She has a normal mood and affect.  Nursing note and vitals reviewed.    ED Treatments / Results  Labs (all labs ordered are listed, but only abnormal results are displayed) Labs Reviewed  CBC WITH DIFFERENTIAL/PLATELET - Abnormal; Notable for the following components:      Result Value   RBC 3.76 (*)    Hemoglobin 11.4 (*)    HCT 34.5 (*)    Neutro Abs 8.1 (*)    All other components within normal limits  COMPREHENSIVE METABOLIC PANEL - Abnormal; Notable for the following components:   Potassium 3.3 (*)    Glucose, Bld 154 (*)    BUN 22 (*)    Creatinine, Ser 1.14 (*)    Calcium 8.7 (*)    GFR calc non Af Amer 51 (*)    GFR calc Af Amer 59 (*)    All other components within normal limits  I-STAT TROPONIN, ED  I-STAT TROPONIN, ED    EKG EKG Interpretation  Date/Time:  Monday October 25 2017 15:48:12 EDT Ventricular Rate:  73 PR Interval:    QRS Duration: 159 QT Interval:  452 QTC Calculation: 499 R Axis:   27 Text Interpretation:  Sinus rhythm Prolonged PR interval Left bundle branch block No significant change since last tracing Confirmed by Wandra Arthurs [id number]) on 10/25/2017 4:09:37 PM   Radiology No results found.  Procedures Procedures (including critical care time)  Medications Ordered in ED Medications  sodium chloride 0.9 % bolus 1,000 mL (0 mLs Intravenous Stopped 10/25/17 1746)    hydrOXYzine (VISTARIL) injection 50 mg (50 mg Intramuscular Given 10/25/17 1601)     Initial Impression / Assessment and Plan / ED Course  I have reviewed the triage vital signs and the nursing notes.  Pertinent labs & imaging results that were available during my care of the patient were reviewed by me and considered in my medical decision making (see chart for details).     Deanna Thompson is a 62 y.o. female here with anaphylaxis. Given benadryl, solumedrol, epinephrine prior to arrival. OP clear, no wheezing now. Will observe for 3 hours and reassess.   6:35 PM Observed for 3 hrs. Rash improved. Delta trop neg. No difficulty breathing. Has previous allergic reactions. Will dc home with steroids, epi pen.    Final Clinical Impressions(s) / ED Diagnoses   Final diagnoses:  None    ED Discharge Orders    None       Drenda Freeze, MD 10/25/17 [phone]

## 2017-10-25 NOTE — ED Notes (Signed)
Per YAO,MD patient can have PO fluids.

## 2017-10-25 NOTE — Discharge Instructions (Addendum)
Take benadryl 50 mg every 6 hrs as needed for itchiness and rash.   Take prednisone as prescribed.   Use epi pen if you have trouble breathing, throat closing.   See your doctor  Return to ER if you have trouble breathing, worse rash, throat closing

## 2017-10-25 NOTE — ED Triage Notes (Addendum)
Patient arrived via [facility] from doctors office. Patient was receiving an infusion of orencia. Patient started to have a reaction about 15 min into the infusion (1345). Patient was wheezing before ems arrived. Patient c/o chest pain, rash, hives and throating closing up. Patient was suppose to get 1000mg  but, patient only received 24ml. Office gave patient 25mg  benadryl, 125 solumedrol, 2 puffs of pro air, zofran per office facility,and 257ml NS. EMS gave patient 0.3 epi IM, 25 mg bendryl. Patient still c/o nausea/ tightness. bp-103/52,HR-69. Patient had tylenol 500mg  1340 before the reaction. Office paper work at bedside. EKG LEFT BUNDLE same as patients HX.

## 2017-10-25 NOTE — Therapy (Signed)
[facility] [address] [place], [place], [address] Phone: [phone]   Fax:  [phone]  Physical Therapy Treatment  Patient Details  Name: Deanna Thompson MRN: [id number] Date of Birth: 12-May-1955 Referring Provider: Venancio Poisson, NP   Encounter Date: 10/25/2017  PT End of Session - 10/25/17 1008    Visit Number  4    Number of Visits  5    Date for PT Re-Evaluation  11/05/17    Authorization Type  [employer], UMR 30 VL    Authorization Time Period  NA    Authorization - Visit Number  14 includes 10 visits prior episode of PT 5-08/2017    Authorization - Number of Visits  30 pt, ot, slp    PT Start Time  0846    PT Stop Time  0945    PT Time Calculation (min)  59 min    Activity Tolerance  Patient tolerated treatment well    Behavior During Therapy  [facility] for tasks assessed/performed       Past Medical History:  Diagnosis Date  . Abdominal hernia   . Abscess of pulp of tooth 12/12/14   left side  . Anxiety   . Arthritis    back  and down flank  right side more than left  . B12 deficiency 06/12/2015  . Brain aneurysm    METAL COIL PREVENTS MRI IMAGING and CLIP  . Cirrhosis ([place])    Presumably alcoholic cirrhosis  . Complication of anesthesia   . Depression   . ETOH abuse    Quit in 07/2011   . Fatigue    due to Sjogren's disease  . Folate deficiency 06/30/2016  . GERD (gastroesophageal reflux disease)   . HTN (hypertension)    no longer medicated, dated on 04/25/12  . IBS (irritable bowel syndrome)   . Iron deficiency anemia due to chronic blood loss   . Myofascial pain   . Panic attack   . PONV (postoperative nausea and vomiting)   . Psoriatic arthritis ([place])   . Right inguinal hernia 08/16/2012  . Seizures ([place]) 1997   1 seizure from coil insertion but no more seizures and no meds for seizures  . Sjogren's disease ([place])   . [place] HEAD 02/03/2007   Qualifier: Diagnosis of  By: Aline Brochure  MD, Dorothyann Peng    . 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([place])     Past Surgical History:  Procedure Laterality Date  . AGILE CAPSULE N/A 06/21/2014   Procedure: AGILE CAPSULE;  Surgeon: Danie Binder, MD;  [facility];  Service: Endoscopy;  Laterality: N/A;  0700  . BIOPSY  10/13/2011   SLF: mild gastritis/duodenal polypoid lesion   . CEREBRAL ANEURYSM REPAIR  1997  . COLONOSCOPY  10/13/2011   YIA:[id number] hemorrhoids/varices rectal/lesion in ascending colon(HYPERPLASTIC POLYP)  . COLONOSCOPY N/A 01/17/2013   SLF: 1. Internal hemorrhoids 2. Ascending colon polyps 3.  Rectal varices.   . COLONOSCOPY N/A 08/10/2016   Dr. Oneida Alar: redundant left colon. external and internal hemorhroids. 5-10 year surveillance  . ESOPHAGOGASTRODUODENOSCOPY N/A 06/29/2014   SLF: 1. anemia due to polypoid lesions in teh antrum, and duodenal polyps 2. single duodenum AVM  . FLEXIBLE SIGMOIDOSCOPY N/A 01/21/2016   Procedure: FLEXIBLE SIGMOIDOSCOPY;  Surgeon: Danie Binder, MD;  [facility];  Service: Endoscopy;  Laterality: N/A;  245  . GIVENS CAPSULE STUDY N/A 06/29/2014   Procedure: GIVENS CAPSULE STUDY;  Surgeon: Danie Binder, MD;  [facility];  Service: Endoscopy;  Laterality: N/A;  . GIVENS CAPSULE STUDY N/A 07/13/2014   Procedure: GIVENS CAPSULE STUDY;  Surgeon: Danie Binder, MD;  [facility];  Service: Endoscopy;  Laterality: N/A;  700  . HEMORRHOID BANDING N/A 01/21/2016   Procedure: HEMORRHOID BANDING;  Surgeon: Danie Binder, MD;  [facility];  Service: Endoscopy;  Laterality: N/A;  . INGUINAL HERNIA REPAIR Right 08/28/2013   Procedure: RIGHT NGUINAL HERNIORRHAPHY WITH MESH;  Surgeon: Jamesetta So, MD;  Location: [facility];  Service: General;  Laterality: Right;  . INSERTION OF MESH N/A 06/09/2013   Procedure: INSERTION OF MESH;  Surgeon: Jamesetta So, MD;  Location: [facility];  Service: General;  Laterality: N/A;  . INSERTION OF MESH Right  08/28/2013   Procedure: INSERTION OF MESH;  Surgeon: Jamesetta So, MD;  Location: [facility];  Service: General;  Laterality: Right;  . PARACENTESIS  Feb 2014   10.7 liters  . TOOTH EXTRACTION Left 12/12/14  . UMBILICAL HERNIA REPAIR N/A 06/09/2013   Procedure: UMBILICAL HERNIORRHAPHY WITH MESH;  Surgeon: Jamesetta So, MD;  Location: [facility];  Service: General;  Laterality: N/A;    There were no vitals filed for this visit.  Subjective Assessment - 10/25/17 0849    Subjective  Did the Brandt-Daroff exercise one time per day and going left felt nothing and right felt eyes cross and really dizzy (5-7). Dizziness/yucky feeling lasted for hours and then finally felt better. Did this for 4 days and then didn't do it anymore as ddidnt want to feel lousy all day again. Did not do VOR because not having symptoms of head turns and environment catching up.     Patient is accompained by:  Family member    Pertinent History  HTN, ETOH abuse, cirrhosis, brain aneurysm, Sjogren's disease, basilar aneurysm clip and coiling, Lt MCA aneurysm    Diagnostic tests  CT scans x 2 (cannot do MRI becasue of the clips & coils);     Patient Stated Goals  stop having dizziness and get back to walking without LOB    Currently in Pain?  No/denies             Vestibular Assessment - 10/25/17 0958      Positional Testing   Dix-Hallpike  Dix-Hallpike Right;Dix-Hallpike Left    Sidelying Test  --    Horizontal Canal Testing  Horizontal Canal Right;Horizontal Canal Left      Dix-Hallpike Right   Dix-Hallpike Right Duration  15    Dix-Hallpike Right Symptoms  Upbeat, right rotatory nystagmus      Dix-Hallpike Left   Dix-Hallpike Left Duration  0    Dix-Hallpike Left Symptoms  No nystagmus      Horizontal Canal Right   Horizontal Canal Right Duration  0    Horizontal Canal Right Symptoms  Normal      Horizontal Canal Left   Horizontal Canal Left Duration  0    Horizontal Canal Left Symptoms  Normal                 Vestibular Treatment/Exercise - 10/25/17 1000      Vestibular Treatment/Exercise   Vestibular Treatment Provided  Canalith Repositioning    Canalith Repositioning  Epley Manuever Right       EPLEY MANUEVER RIGHT   Number of Reps   2    Overall Response  No change      OTHER   Comment  semi-demont  to left to treat Rt posterior canal apogeotropic            PT Education - 10/25/17 1003    Education Details  system is taking more time to recover from the cannalith repositioning due to CNS involvement and not able to quickly suppress or reset after manuevers (for hours). Do not do Brandt-Daroff for several days to see how she feels    Person(s) Educated  Patient    Methods  Explanation    Comprehension  Verbalized understanding       PT Short Term Goals - 10/06/17 1915      PT SHORT TERM GOAL #1   Title  --        PT Long Term Goals - 10/06/17 1751      PT LONG TERM GOAL #1   Title  Patient will be independent with updated HEP upon d/c from PT. (Target for all LTGs 11/05/2017)    Time  4    Period  Weeks    Status  New    Target Date  11/05/17      PT LONG TERM GOAL #2   Title  Patient will have negative positional testing for BPPV.     Time  4    Period  Weeks    Status  New      PT LONG TERM GOAL #3   Title  Patient will be able to verbalize warning signs of CVA (including posterior circulation CVAs) and appropriate steps to take if CVA suspected.     Time  4    Period  Weeks    Status  New      PT LONG TERM GOAL #4   Status  --      PT LONG TERM GOAL #5   Title  --    Time  --    Period  --    Status  --            Plan - 10/25/17 1009    Clinical Impression Statement  Patient with no nystagmus with assessment of left posterior canal (and reports no symptoms when going to her left side during Brandt-Daroff at home). She did have + rt Hallpike-Dix with upbeating right rotary nystagmus. Treated with Epley and then nysagmus  changed to downbeating rt rotary nystagmus with repeat rt Hallpike-Dix. Treated with left demi-Semont to treat rt posterior canal apogeotropic BPPV. After 10 minutes rest to allow her system to "settle" she had no symptoms or nystagmus with left or right Hallpike-Dix or Lt or Rt supine head roll test.     Rehab Potential  Good    Clinical Impairments Affecting Rehab Potential  anxiety, mixed peripheral and central vestibular deficits    PT Frequency  1x / week    PT Duration  4 weeks    PT Treatment/Interventions  ADLs/Self Care Home Management;Canalith Repostioning;Therapeutic activities;Therapeutic exercise;Balance training;Neuromuscular re-education;Cognitive remediation;Patient/family education;Passive range of motion;Vestibular;Visual/perceptual remediation/compensation;Biofeedback    PT Next Visit Plan  should be last visit (#5 of 5)check for right and left posterior BPPV (she's recently had both) and treat accordingly; check goals and ?d/c     check balance exercises from HEP below and upgrade as needed    PT Home Exercise Plan  from recent PT episode of care:  VORx1; corner on pillow, feet partial tandem, EO head turns, EC head neutral; walking diagonal head turns; eye tracking horiz and vertical; tandem walk; backward walk; SLS    Consulted and Agree  with Plan of Care  Patient       Patient will benefit from skilled therapeutic intervention in order to improve the following deficits and impairments:  Abnormal gait, Decreased activity tolerance, Decreased balance, Decreased cognition, Decreased mobility, Decreased knowledge of use of DME, Dizziness, Impaired vision/preception, Difficulty walking  Visit Diagnosis: BPPV (benign paroxysmal positional vertigo), bilateral  Dizziness and giddiness  Other symptoms and signs involving the nervous system  Difficulty in walking, not elsewhere classified     Problem List Patient Active Problem List   Diagnosis Date Noted  . Benign  paroxysmal positional vertigo   . 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 Essential hypertension 07/31/2017  . GERD (gastroesophageal reflux disease) 07/31/2017  . Alcohol abuse 07/31/2017  . Brain aneurysm 07/31/2017  . CKD (chronic kidney disease), stage III ([place]) 07/31/2017  . LBBB (left bundle branch block) 07/31/2017  . Dizziness 07/31/2017  . Nausea & vomiting 07/31/2017  . Folate deficiency 06/30/2016  . Constipation 04/01/2016  . 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 Hemorrhoids, internal, with bleeding 01/08/2016  . B12 deficiency 06/12/2015  . Psoriatic arthritis ([place]) 04/15/2015  . Nutritional anemia 09/19/2014  . Iron deficiency anemia due to chronic blood loss   . Insomnia 03/17/2014  . Depression 02/18/2014  . Labial swelling 08/23/2012  . Rash and nonspecific skin eruption 06/30/2012  . Colon polyps 11/18/2011  . Cirrhosis ([place]) 08/19/2011    Rexanne Mano, PT 10/25/2017, 2:30 PM  [facility] [address] [place], [place], [address] Phone: [phone]   Fax:  [phone]  Name: Deanna Thompson MRN: [id number] Date of Birth: [birth year]

## 2017-10-26 ENCOUNTER — Encounter: Payer: Commercial Managed Care - PPO | Admitting: Physical Therapy

## 2017-10-28 ENCOUNTER — Emergency Department (HOSPITAL_COMMUNITY)
Admission: EM | Admit: 2017-10-28 | Discharge: 2017-10-28 | Disposition: A | Discharge status: Home / Self Care | Payer: Commercial Managed Care - PPO | Attending: Emergency Medicine | Admitting: Emergency Medicine

## 2017-10-28 ENCOUNTER — Telehealth: Payer: Self-pay

## 2017-10-28 ENCOUNTER — Encounter (HOSPITAL_COMMUNITY): Payer: Self-pay | Admitting: Emergency Medicine

## 2017-10-28 ENCOUNTER — Encounter: Payer: Self-pay | Admitting: Adult Health

## 2017-10-28 ENCOUNTER — Other Ambulatory Visit: Payer: Self-pay

## 2017-10-28 ENCOUNTER — Emergency Department (HOSPITAL_COMMUNITY): Payer: Commercial Managed Care - PPO

## 2017-10-28 DIAGNOSIS — D500 Iron deficiency anemia secondary to blood loss (chronic): Secondary | ICD-10-CM | POA: Diagnosis not present

## 2017-10-28 DIAGNOSIS — D649 Anemia, unspecified: Secondary | ICD-10-CM

## 2017-10-28 DIAGNOSIS — N183 Chronic kidney disease, stage 3 (moderate): Secondary | ICD-10-CM | POA: Diagnosis not present

## 2017-10-28 DIAGNOSIS — Z79899 Other long term (current) drug therapy: Secondary | ICD-10-CM | POA: Insufficient documentation

## 2017-10-28 DIAGNOSIS — I129 Hypertensive chronic kidney disease with stage 1 through stage 4 chronic kidney disease, or unspecified chronic kidney disease: Secondary | ICD-10-CM | POA: Diagnosis not present

## 2017-10-28 DIAGNOSIS — G459 Transient cerebral ischemic attack, unspecified: Secondary | ICD-10-CM | POA: Insufficient documentation

## 2017-10-28 DIAGNOSIS — N289 Disorder of kidney and ureter, unspecified: Secondary | ICD-10-CM

## 2017-10-28 DIAGNOSIS — R569 Unspecified convulsions: Secondary | ICD-10-CM | POA: Insufficient documentation

## 2017-10-28 DIAGNOSIS — Z87891 Personal history of nicotine dependence: Secondary | ICD-10-CM | POA: Diagnosis not present

## 2017-10-28 DIAGNOSIS — H538 Other visual disturbances: Secondary | ICD-10-CM | POA: Diagnosis present

## 2017-10-28 DIAGNOSIS — Z7982 Long term (current) use of aspirin: Secondary | ICD-10-CM | POA: Insufficient documentation

## 2017-10-28 LAB — COMPREHENSIVE METABOLIC PANEL
ALT: 27 U/L (ref 0–44)
ANION GAP: 9 (ref 5–15)
AST: 26 U/L (ref 15–41)
Albumin: 3.7 g/dL (ref 3.5–5.0)
Alkaline Phosphatase: 59 U/L (ref 38–126)
BUN: 29 mg/dL — ABNORMAL HIGH (ref 6–20)
CHLORIDE: 103 mmol/L (ref 98–111)
CO2: 25 mmol/L (ref 22–32)
Calcium: 9.2 mg/dL (ref 8.9–10.3)
Creatinine, Ser: 1.13 mg/dL — ABNORMAL HIGH (ref 0.44–1.00)
GFR calc non Af Amer: 52 mL/min — ABNORMAL LOW (ref >60)
GFR, EST AFRICAN AMERICAN: 60 mL/min — AB (ref >60)
Glucose, Bld: 146 mg/dL — ABNORMAL HIGH (ref 70–99)
POTASSIUM: 3.7 mmol/L (ref 3.5–5.1)
SODIUM: 137 mmol/L (ref 135–145)
Total Bilirubin: 0.5 mg/dL (ref 0.3–1.2)
Total Protein: 6.8 g/dL (ref 6.5–8.1)

## 2017-10-28 LAB — DIFFERENTIAL
BASOS PCT: 0 %
Basophils Absolute: 0 10*3/uL (ref 0.0–0.1)
EOS ABS: 0 10*3/uL (ref 0.0–0.7)
Eosinophils Relative: 0 %
Lymphocytes Relative: 14 %
Lymphs Abs: 1.5 10*3/uL (ref 0.7–4.0)
Monocytes Absolute: 0.9 10*3/uL (ref 0.1–1.0)
Monocytes Relative: 8 %
Neutro Abs: 8.5 10*3/uL — ABNORMAL HIGH (ref 1.7–7.7)
Neutrophils Relative %: 78 %

## 2017-10-28 LAB — APTT: APTT: 31 s (ref 24–36)

## 2017-10-28 LAB — CBC
HCT: 35.1 % — ABNORMAL LOW (ref 36.0–46.0)
Hemoglobin: 11.4 g/dL — ABNORMAL LOW (ref 12.0–15.0)
MCH: 30.2 pg (ref 26.0–34.0)
MCHC: 32.5 g/dL (ref 30.0–36.0)
MCV: 93.1 fL (ref 78.0–100.0)
Platelets: 207 K/uL (ref 150–400)
RBC: 3.77 MIL/uL — ABNORMAL LOW (ref 3.87–5.11)
RDW: 14.3 % (ref 11.5–15.5)
WBC: 10.9 K/uL — ABNORMAL HIGH (ref 4.0–10.5)

## 2017-10-28 LAB — ETHANOL: Alcohol, Ethyl (B): <10 mg/dL

## 2017-10-28 LAB — PROTIME-INR
INR: 1.12
PROTHROMBIN TIME: 14.4 s (ref 11.4–15.2)

## 2017-10-28 LAB — I-STAT TROPONIN, ED: Troponin i, poc: 0 ng/mL (ref 0.00–0.08)

## 2017-10-28 NOTE — ED Triage Notes (Signed)
Pt states around 2000 last night she say a "blip" in her vision. Pt denies headache or visual changes last night after the incident. Pt states that this AM she noticed that her vision was worse. Pt states that only symptom she notices at this time is that her left eye is " a little off."

## 2017-10-28 NOTE — Telephone Encounter (Signed)
RN call patient about her mychart message about her vision. Pt stated her vision was irregular with double in May 2019. But since last visit it had gotten better in June 2019 , and July 2019. She has a episode yesterday, and this morning her vision was destorted. Rn stated from the email it states her vision has gotten worse since may 2019. Pt stated she might of worded the email wrong. Rn stated Janett Billow NP recommend ED of her worsening vision. The pt was driving while I was talking to her. PT stated she is okay to drive,and her vision does  not affect her driving Pt stated now her vision is doing fine now because it has resolve. PT stated that one episode just gave her a scare. RN told pt if her vision starts getting worse again to seek the nearest Ed to make sure its not a new stoke, or old stroke worsening. PT verbalized understanding.

## 2017-10-28 NOTE — Discharge Instructions (Addendum)
Have your neurologist discuss with your gastroenterologist to find the highest dose of aspirin you can take safely to try to prevent future attacks.Marland Kitchen

## 2017-10-28 NOTE — Telephone Encounter (Signed)
Deanna Thompson,  I would recommend that you be evaluated in the ED as your symptoms have worsened in order to be evaluated for a potential new stroke or worsening of your previous stroke.   -Venancio Poisson, NP

## 2017-10-28 NOTE — Telephone Encounter (Signed)
I'm [place] and I saw you in May for slight brain stem stroke follow up...  Last night, I felt the same symptoms but they resolved after a few seconds...  This morning, the vision problems from the May event are slightly but noticeably worse...  Does this indicate a second smaller stroke?  If I need to come in I will be happy to do so.Marland KitchenMarland Kitchen

## 2017-10-28 NOTE — ED Provider Notes (Signed)
[facility] EMERGENCY DEPARTMENT Provider Note   CSN: [id number] Arrival date & time: 10/28/17  1914     History   Chief Complaint Chief Complaint  Patient presents with  . Visual Changes    HPI Deanna Thompson is a 62 y.o. female.  The history is provided by the patient.  She has history of hypertension, posterior circulation stroke, alcoholic cirrhosis, Sjogren's disease and comes in because of concern about a second stroke.  She states that at about 8 PM last night, she noted what she describes as a blip in her vision.  This is similar to what she had with her stroke in May.  It seemed to get better and she went to sleep, but when she woke up, she had the same type of blip.  On further questioning, she states that her left knee would lag where the right is tracking, and that would make her nauseous.  She states that it is still present to a slight degree.  She denies any headache.  She has been able to walk normally.  With her stroke, she states that she was not able to walk.  She has been undergoing therapy to try to improve her extraocular movements, and had been doing well until last night.  She called her neurologist who advised her to come to the ED for evaluation.  She denies any weakness or numbness and denies any difficulty with balance or coordination.  Denies any actual vision change.  Past Medical History:  Diagnosis Date  . Abdominal hernia   . Abscess of pulp of tooth 12/12/14   left side  . Anxiety   . Arthritis    back  and down flank  right side more than left  . B12 deficiency 06/12/2015  . Brain aneurysm    METAL COIL PREVENTS MRI IMAGING and CLIP  . Cirrhosis ([place])    Presumably alcoholic cirrhosis  . Complication of anesthesia   . Depression   . ETOH abuse    Quit in 07/2011   . Fatigue    due to Sjogren's disease  . Folate deficiency 06/30/2016  . GERD (gastroesophageal reflux disease)   . HTN (hypertension)    no longer medicated, dated on 04/25/12  .  IBS (irritable bowel syndrome)   . Iron deficiency anemia due to chronic blood loss   . Myofascial pain   . Panic attack   . PONV (postoperative nausea and vomiting)   . Psoriatic arthritis ([place])   . Right inguinal hernia 08/16/2012  . Seizures ([place]) 1997   1 seizure from coil insertion but no more seizures and no meds for seizures  . Sjogren's disease ([place])   . [place] HEAD 02/03/2007   Qualifier: Diagnosis of  By: Aline Brochure MD, Dorothyann Peng    . 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[facility])     Patient Active Problem List   Diagnosis Date Noted  . Benign paroxysmal positional vertigo   . 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 Essential hypertension 07/31/2017  . GERD (gastroesophageal reflux disease) 07/31/2017  . Alcohol abuse 07/31/2017  . Brain aneurysm 07/31/2017  . CKD (chronic kidney disease), stage III ([place]) 07/31/2017  . LBBB (left bundle branch block) 07/31/2017  . Dizziness 07/31/2017  . Nausea & vomiting 07/31/2017  . Folate deficiency 06/30/2016  . Constipation 04/01/2016  . 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 Hemorrhoids, internal, with bleeding 01/08/2016  . B12 deficiency 06/12/2015  . Psoriatic arthritis ([place]) 04/15/2015  . Nutritional anemia 09/19/2014  . Iron deficiency anemia  due to chronic blood loss   . Insomnia 03/17/2014  . Depression 02/18/2014  . Labial swelling 08/23/2012  . Rash and nonspecific skin eruption 06/30/2012  . Colon polyps 11/18/2011  . Cirrhosis ([place]) 08/19/2011    Past Surgical History:  Procedure Laterality Date  . AGILE CAPSULE N/A 06/21/2014   Procedure: AGILE CAPSULE;  Surgeon: Danie Binder, MD;  Location: [facility] ENDO SUITE;  Service: Endoscopy;  Laterality: N/A;  0700  . BIOPSY  10/13/2011   SLF: mild gastritis/duodenal polypoid lesion   . CEREBRAL ANEURYSM REPAIR  1997  . COLONOSCOPY  10/13/2011   WVP:XTGGYIRS hemorrhoids/varices rectal/lesion in ascending colon(HYPERPLASTIC POLYP)  . COLONOSCOPY N/A 01/17/2013   SLF: 1.  Internal hemorrhoids 2. Ascending colon polyps 3.  Rectal varices.   . COLONOSCOPY N/A 08/10/2016   Dr. Oneida Alar: redundant left colon. external and internal hemorhroids. 5-10 year surveillance  . ESOPHAGOGASTRODUODENOSCOPY N/A 06/29/2014   SLF: 1. anemia due to polypoid lesions in teh antrum, and duodenal polyps 2. single duodenum AVM  . FLEXIBLE SIGMOIDOSCOPY N/A 01/21/2016   Procedure: FLEXIBLE SIGMOIDOSCOPY;  Surgeon: Danie Binder, MD;  Location: [facility] ENDO SUITE;  Service: Endoscopy;  Laterality: N/A;  245  . GIVENS CAPSULE STUDY N/A 06/29/2014   Procedure: GIVENS CAPSULE STUDY;  Surgeon: Danie Binder, MD;  Location: [facility] END[facility] SUITE;  Service: Endoscopy;  Laterality: N/A;  . GIVENS CAPSULE STUDY N/A 07/13/2014   Procedure: GIVENS CAPSULE STUDY;  Surgeon: Danie Binder, MD;  Location: [facility] END[facility] SUITE;  Service: Endoscopy;  Laterality: N/A;  700  . HEMORRHOID BANDING N/A 01/21/2016   Procedure: HEMORRHOID BANDING;  Surgeon: Danie Binder, MD;  Location: [facility] END[facility] SU[facility];  Service: Endoscopy;  Laterality: N/A;  . INGUINAL HERNIA REPAIR Right 08/28/2013   Procedure: RIGHT NGUINAL HERNIORRHAPHY WITH MESH;  Surgeon: Jamesetta So, MD;  Location: [facility] ORS;  Service: General;  Laterality: Right;  . INSERTION OF MESH N/A 06/09/2013   Procedure: INSERTION OF MESH;  Surgeon: Jamesetta So, MD;  Location: [facility] ORS;  Service: General;  Laterality: N/A;  . INSERTION OF MESH Right 08/28/2013   Procedure: INSERTION OF MESH;  Surgeon: Jamesetta So, MD;  Location: [facility] ORS;  Service: General;  Laterality: Right;  . PARACENTESIS  Feb 2014   10.7 liters  . TOOTH EXTRACTION Left 12/12/14  . UMBILICAL HERNIA REPAIR N/A 06/09/2013   Procedure: UMBILICAL HERNIORRHAPHY WITH MESH;  Surgeon: Jamesetta So, MD;  Location: [facility] ORS;  Service: General;  Laterality: N/A;     OB History    Gravida      Para      Term      Preterm      AB      Living  0     SAB      TAB      Ectopic      Multiple      Live Births                 Home Medications    Prior to Admission medications   Medication Sig Start Date End Date Taking? Authorizing Provider  aspirin EC 81 MG EC tablet Take 1 tablet (81 mg total) by mouth daily. 08/03/17   Patrecia Pour, MD  atorvastatin (LIPITOR) 40 MG tablet TAKE (1) TABLET BY MOUTH ONCE DAILY. 10/12/17   Mikey Kirschner, MD  bumetanide (BUMEX) 1 MG tablet TAKE (1) TABLET BY MOUTH (2) TIMES DAILY. Patient taking differently: Take 1  mg by mouth 2 (two) times daily.  04/01/16   Fields, Marga Melnick, MD  buPROPion (WELLBUTRIN SR) 100 MG 12 hr tablet Take 1 tablet (100 mg total) by mouth 2 (two) times daily. 10/12/17   Mikey Kirschner, MD  docusate sodium (COLACE) 100 MG capsule Take 100 mg by mouth daily.     [provider]  escitalopram (LEXAPRO) 20 MG tablet TAKE (1) TABLET BY MOUTH AT BEDTIME. 10/12/17   Mikey Kirschner, MD  fluticasone (FLONASE) 50 MCG/ACT nasal spray Place 1 spray into both nostrils See admin instructions. Instill 1 spray into each nostril every morning, may also use later in the day as needed for sinus congestion    [provider]  hydrocortisone cream 1 % Apply 1 application topically 2 (two) times daily as needed (eczema in ears).    [provider]  Lifitegrast Shirley Friar) 5 % SOLN Place 1 drop into both eyes 2 (two) times daily.    [provider]  linaclotide Rolan Lipa) 145 MCG CAPS capsule Take 1 capsule (145 mcg total) by mouth daily before breakfast. 02/24/17   Mahala Menghini, PA-C  loratadine (CLARITIN) 10 MG tablet Take 10 mg by mouth daily.    [provider]  losartan (COZAAR) 25 MG tablet TAKE (1) TABLET BY MOUTH ONCE DAILY. 10/12/17   Mikey Kirschner, MD  meclizine (ANTIVERT) 25 MG tablet Take 1 tablet (25 mg total) by mouth 3 (three) times daily as needed for dizziness. 08/02/17   Patrecia Pour, MD  Multiple Vitamin (MULTIVITAMIN WITH MINERALS) TABS tablet Take 1 tablet by mouth daily.    [provider]   ondansetron (ZOFRAN) 4 MG tablet Take 1 tablet (4 mg total) by mouth every 8 (eight) hours as needed for nausea or vomiting. 08/02/17   Patrecia Pour, MD  Polyvinyl Alcohol-Povidone (REFRESH OP) Place 1 drop into both eyes 3 (three) times daily as needed (dry eyes).    [provider]  predniSONE (DELTASONE) 20 MG tablet Take 60 mg daily x 2 days then 40 mg daily x 2 days then 20 mg daily x 2 days 10/25/17   Drenda Freeze, MD  ranitidine (ZANTAC) 150 MG tablet TAKE (1) TABLET BY MOUTH TWICE DAILY. 07/05/17   Mahala Menghini, PA-C  Simethicone (GAS-X PO) Take 1 tablet by mouth 2 (two) times daily as needed (bloating/flatulence).    [provider]  sodium chloride (OCEAN) 0.65 % nasal spray Place 1 spray into the nose 2 (two) times daily as needed for congestion.     [provider]  spironolactone (ALDACTONE) 100 MG tablet TAKE (1) TABLET BY MOUTH TWICE DAILY. 07/05/17   Mahala Menghini, PA-C    Family History Family History  Problem Relation Age of Onset  . Cirrhosis Mother        nash  . COPD Father   . Heart disease Father   . Asthma Unknown   . Heart disease Unknown   . Colon cancer Neg Hx   . Inflammatory bowel disease Neg Hx     Social History Social History   Tobacco Use  . Smoking status: Former Smoker    Packs/day: 2.00    Years: 12.00    Pack years: 24.00    Types: Cigarettes    Last attempt to quit: 03/31/1995    Years since quitting: 22.5  . Smokeless tobacco: Never Used  . Tobacco comment: quit in 1997  Substance Use Topics  . Alcohol  use: No    Comment: Bottle of wine a day for years, but quit in 07/2011  . Drug use: No     Allergies   Omeprazole; Orencia [abatacept]; Other; Phenytoin; Keflex [cephalexin]; and Phenergan [promethazine hcl]   Review of Systems Review of Systems  All other systems reviewed and are negative.    Physical Exam Updated Vital Signs BP (!) 145/72 (BP Location: Right Arm)   Pulse 73   Temp 98.6 F  (37 C) (Oral)   Resp 20   SpO2 98%   Physical Exam  Nursing note and vitals reviewed.  62 year old female, resting comfortably and in no acute distress. Vital signs are significant for mildly elevated systolic blood pressure. Oxygen saturation is 98%, which is normal. Head is normocephalic and atraumatic. PERRLA, EOMI. Oropharynx is clear.  There is no nystagmus. Neck is nontender and supple without adenopathy or JVD.  There are no carotid bruits. Back is nontender and there is no CVA tenderness. Lungs are clear without rales, wheezes, or rhonchi. Chest is nontender. Heart has regular rate and rhythm without murmur. Abdomen is soft, flat, nontender without masses or hepatosplenomegaly and peristalsis is normoactive. Extremities have no cyanosis or edema, full range of motion is present. Skin is warm and dry without rash. Neurologic: Awake and alert and oriented x3.  Speech spontaneous and appropriate.  Visual fields intact to confrontation.  Cranial nerves are intact.  Motor strength is 5/5 in both arms and both legs.  There is no pronator drift.  Sensation is intact in all 4 extremities.  There is no extinction on double simultaneous stimulation.  Finger-to-nose test is normal.  Romberg is negative.  Gait is normal.  ED Treatments / Results  Labs (all labs ordered are listed, but only abnormal results are displayed) Labs Reviewed  CBC - Abnormal; Notable for the following components:      Result Value   WBC 10.9 (*)    RBC 3.77 (*)    Hemoglobin 11.4 (*)    HCT 35.1 (*)    All other components within normal limits  DIFFERENTIAL - Abnormal; Notable for the following components:   Neutro Abs 8.5 (*)    All other components within normal limits  COMPREHENSIVE METABOLIC PANEL - Abnormal; Notable for the following components:   Glucose, Bld 146 (*)    BUN 29 (*)    Creatinine, Ser 1.13 (*)    GFR calc non Af Amer 52 (*)    GFR calc Af Amer 60 (*)    All other components within  normal limits  ETHANOL  PROTIME-INR  APTT  I-STAT TROPONIN, ED    EKG EKG Interpretation  Date/Time:  Thursday October 28 2017 20:08:13 EDT Ventricular Rate:  63 PR Interval:    QRS Duration: 98 QT Interval:  450 QTC Calculation: 461 R Axis:   52 Text Interpretation:  Sinus rhythm Nonspecific T abnormalities, anterior leads When compared with ECG of 10/25/2017, Left bundle branch block is no longer present Confirmed by Delora Fuel ([id number]) on 10/28/2017 8:28:01 PM   Radiology Ct Head Wo Contrast  Result Date: 10/28/2017 CLINICAL DATA:  Visual disturbance, history of brain aneurysm and repair EXAM: CT HEAD WITHOUT CONTRAST TECHNIQUE: Contiguous axial images were obtained from the base of the skull through the vertex without intravenous contrast. COMPARISON:  08/02/2017 FINDINGS: Brain: No evidence of acute infarction, hemorrhage, hydrocephalus, extra-axial collection or mass lesion/mass effect. Encephalomalacic changes in the right inferior frontal lobe, anterior temporal lobe, and insula.  Mild subcortical white matter and periventricular small vessel ischemic changes. Vascular: Basilar aneurysm clip. Mild subcortical white matter and periventricular small vessel ischemic changes. Skull: Normal. Negative for fracture or focal lesion. Status post right craniotomy. Sinuses/Orbits: The visualized paranasal sinuses are essentially clear. The mastoid air cells are unopacified. Other: None. IMPRESSION: Status post basilar aneurysm clip with stable right-sided encephalomalacic changes. No evidence of acute intracranial abnormality. Electronically Signed   By: Julian Hy M.D.   On: 10/28/2017 20:45    Procedures Procedures  Medications Ordered in ED Medications - No data to display   Initial Impression / Assessment and Plan / ED Course  I have reviewed the triage vital signs and the nursing notes.  Pertinent labs & imaging results that were available during my care of the patient were  reviewed by me and considered in my medical decision making (see chart for details).  Possible transient ischemic attack.  Normal neurologic exam per my exam.  Old records are reviewed confirming hospitalization for posterior circulation stroke in May.  Neurology consult at that time describes disconjugate gaze and nystagmus which is not noted on exam today.  She also had ataxic gait which is not present today.  She cannot have an MRI scan because of a coil and a cerebral aneurysm.  Will send for CT and get neurology consultation.  However, I feel that unless CT is abnormal, she will be able to go home since TIA work-up had already been completed just 2 months ago.  ED work-up is unremarkable.  Mild anemia is present which is unchanged from baseline, borderline renal insufficiency is present unchanged from baseline.  CT shows presence of coil and known cerebral aneurysm, no acute process.  I have discussed the case with teleneurologist Dr. Dyane Dustman.  Given normal exam and recent complete work-up, he feels there is no indication for admission and no value to his doing an exam.  Patient is currently taking aspirin 81 mg.  She might benefit from a higher dose of aspirin.  This needs to be discussed between her neurologist and gastroenterologist.  Patient is advised of this.  She is to follow-up with her neurologist as soon as possible.  Return precautions discussed.  Final Clinical Impressions(s) / ED Diagnoses   Final diagnoses:  Transient ischemic attack  Renal insufficiency  Normochromic normocytic anemia    ED Discharge Orders    None       Delora Fuel, MD 03/50/09 2111

## 2017-10-29 ENCOUNTER — Telehealth: Payer: Self-pay | Admitting: Gastroenterology

## 2017-10-29 NOTE — Telephone Encounter (Signed)
PLEASE CALL PT. SHE IS OK TO TAKE ASA. HER PLT CRT IS NORMAL. THE ATORVASTATIN CAN CAUSE AN INCREASE IN LIVER ENZYMES BUT HER LAST LIVER PANEL WAS NORMAL ON AUG 1. SHE SHOULD RECHECK IT WITHIN THE NEXT 3 MOS.

## 2017-10-29 NOTE — Telephone Encounter (Signed)
I spoke to pt and she recently had a TIA and was seen at [facility]. She currently takes ASA 81 mg and was advised to have neurologist and gastroenterologist communicate what would be safe as far as her increasing the dose due to TIA. She was recently started on Losartan 25 mg qd and Atorvastatin 40 mg qd.  She is aware Dr. Oneida Alar is at the hospital and I will forward this note to her.

## 2017-10-29 NOTE — Telephone Encounter (Signed)
[facility] IS CLOSED TODAY AFTER 12N. PT MAY INCREASE ASA TO 325 MG DAILY. CALL WITH QUESTIONS OR CONCERNS. LVM ON MOBILE PH#.

## 2017-10-29 NOTE — Telephone Encounter (Signed)
Pt would like for DS to call her back. [phone]

## 2017-10-29 NOTE — Telephone Encounter (Signed)
Pt needs you to address with neurologist if it is safe for her to increase the ASA and if so how much.

## 2017-11-01 NOTE — Telephone Encounter (Signed)
Spoke with pt. She confirmed that she received Dr. Valinda Hoar message and will do ASA 325 mg.

## 2017-11-02 ENCOUNTER — Encounter: Payer: Self-pay | Admitting: Adult Health

## 2017-11-02 ENCOUNTER — Ambulatory Visit (INDEPENDENT_AMBULATORY_CARE_PROVIDER_SITE_OTHER): Payer: Commercial Managed Care - PPO | Admitting: Adult Health

## 2017-11-02 VITALS — BP 118/70 | HR 79 | Ht 68.0 in | Wt 230.6 lb

## 2017-11-02 DIAGNOSIS — I6302 Cerebral infarction due to thrombosis of basilar artery: Secondary | ICD-10-CM | POA: Diagnosis not present

## 2017-11-02 DIAGNOSIS — E785 Hyperlipidemia, unspecified: Secondary | ICD-10-CM

## 2017-11-02 DIAGNOSIS — I10 Essential (primary) hypertension: Secondary | ICD-10-CM | POA: Diagnosis not present

## 2017-11-02 DIAGNOSIS — R2990 Unspecified symptoms and signs involving the nervous system: Secondary | ICD-10-CM

## 2017-11-02 DIAGNOSIS — I671 Cerebral aneurysm, nonruptured: Secondary | ICD-10-CM

## 2017-11-02 DIAGNOSIS — H8113 Benign paroxysmal vertigo, bilateral: Secondary | ICD-10-CM

## 2017-11-02 NOTE — Progress Notes (Addendum)
[facility] [address] [place]. [place] [address] [phone]       OFFICE FOLLOW UP NOTE  Ms. Keili Hasten Date of Birth:  Apr 06, 1955 Medical Record Number:  [id number]   Reason for Referral:  hospital follow up  CHIEF COMPLAINT:  Chief Complaint  Patient presents with  . Follow-up    RM 9, alone. Hospital f/u post TIA. Was seen at [facility] on 10/28/17. Not feeling well since.   . Rehab    Has been going to neurorehab for dizziness and vision issues after last stroke in May. She was improving but since event on 10/28/17 this has worsened again. She feels like she is car sick constantly.   . Pain    Also c/o pain in neck, head. Has TMJ.     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 History obtained from patient and chart review. Reviewed all radiology images and labs personally.   HPI: Ms. 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 Patient does have a history of BPPV, which was short lasting. Patient came to ED with gradual onset of dizziness and then vertigo, nausea/vomiting, diaphoresis as well as binocular diplopia, lasting more than 4 hours and then gradually getting better. Upon admission, her examination benign. CT head reviewed and negative for acute abnormality. CTA head neck showed known left MCA 3 mm aneurysm at bifurcation and left nondominant VA V2 stenosis.  2D echo showed EF of 45-50% (recommended outpatient cardiology follow-up).  A1c 5.8 and LDL 129.  Repeat CT scan was performed 24 hours later and again showed no acute intracranial  abnormality.  MRI unable to be obtained due to aneurysm clip.   As patient was not on antithrombotic prior to admission, recommended to start aspirin 81 mg daily.  As LDL was elevated, it was recommended to start Lipitor 40 mg daily.  Patient does have a history of a 3 mm aneurysm is being followed by Dr. Estanislado Pandy.  History of alcoholic cirrhosis and recommended to be carefully monitored by GI with initiation of Lipitor and aspirin.  During admission, LFTs currently within normal limits.  With symptoms of vertigo, diplopia, and ataxia it was believed that these symptoms were more related to a small brainstem central event as BPPV is not typically associated with diplopia or ataxia lasting for extended amount of time, and vestibular neuritis does not have diplopia but normally lasting longer than this. It was unclear if this event was related to her basilar artery pathology on top of previous coiling and clipping but as a DSA with Dr. Estanislado Pandy was an option, it would not necessarily change management at this time and as her clinical symptoms improved it was decided to hold off on cerebral angiogram at this time. Patient was discharged home in stable condition.   Patient is being seen today for hospital follow-up.  She has been continuing PT for vestibular rehab and has been seeing improvement in her dizziness.  Occasionally she will have increased dizziness with increased visual problems but typically sees this greater when she has increased fatigue.  She states up until yesterday she was about 60% improved and then  felt as though she took a slight decrease in improvement.  She does go to our neuro rehab clinic 2 times per week.  Patient states that she feels fatigued frequently but is one that would rather try to push through her fatigue then rest at that time and does see worsening in symptoms.  She continues to take aspirin with mild bruising but no bleeding.  Continues to take Lipitor without side effects  myalgias.  Blood pressure today satisfactory 119/69.  Patient will be going for liver ultrasound next week and does have liver enzyme testing done every 3 to 6 months.  Patient will also be following up with cardiology on 09/16/2017 for repeat echocardiogram for low EF.  Interval History:  Since previous visit, patient was seen at [facility] ED on 10/28/2017 due to complaints of vision changes.  She was unable to receive MRI due to coiling cerebral aneurysm and CT was negative for acute infarct.  She was diagnosed with possible TIA and recommended increase of aspirin from 81 mg to 325 mg.  Patient was discharged home in stable condition.   Patient was seen today for sooner appointment due to recent hospitalization for possible TIA.  Patient states since this hospitalization, she has had increase and "cluster symptoms".  She has been experiencing these since her stroke in 07/2017 but they have been increased since 10/28/2017.  She experiences these cluster of symptoms after scanning an area with her head/eyes from side to side and will feel a sensation of nausea, "puffy head" and feeling of being off balance.  She did have these on occasion previously but since 8/1 they have been happening daily and will last throughout the day.  She denies headaches or migraine history but does state she has chronic issues with her sinuses and these issues have been worse over the past 7 to 10 days.  She states she will have left eye pain that feels puffy, pain in her jaw and in the back of her neck.  She feels as though this is all related to her sinuses due to sinus stuffiness and postnasal drip sensation.  She denies her "cluster symptoms" being associated with headache/migraine or vision changes.  Denies increased anxiety prior to the symptoms occurring.  She does state that she is been doing home exercises that were given to her after completing PT for BPPV but she will be restarting outpatient PT on 11/08/2017 to see if this will  help with her increase in symptoms.  She also states that since her stroke in 07/2017 she will occasionally feel a "blip" in her vision but only lasts for a few seconds and then resolves.  She is unable to describe her exact symptoms but states it feels like a catching sensation but denies blurry vision or double vision.  She was seen in the ED after this occurring the night prior to due to waking up in the morning with slightly worse vision than the day prior.  Patient is concerned due to not knowing reasoning behind these continued symptoms and is frustrated that she is unable to control these symptoms.  During appointment, patient is very spastic with rapid speech and heightened anxiety.  It was recommended to increase aspirin 81 mg to 325 mg but recommended for clearance from GI (due to history of cirrhosis) and neurology prior to doing so.  GI did clear for this increase and agree with this plan.  No further testing was done during recent hospitalization besides CT scan which  was negative for acute infarct.   ROS:   14 system review of systems performed and negative with exception of dizziness, headache and nervous/anxious  PMH:  Past Medical History:  Diagnosis Date  . Abdominal hernia   . Abscess of pulp of tooth 12/12/14   left side  . Anxiety   . Arthritis    back  and down flank  right side more than left  . B12 deficiency 06/12/2015  . Brain aneurysm    METAL COIL PREVENTS MRI IMAGING and CLIP  . Cirrhosis ([place])    Presumably alcoholic cirrhosis  . Complication of anesthesia   . Depression   . ETOH abuse    Quit in 07/2011   . Fatigue    due to Sjogren's disease  . Folate deficiency 06/30/2016  . GERD (gastroesophageal reflux disease)   . HTN (hypertension)    no longer medicated, dated on 04/25/12  . IBS (irritable bowel syndrome)   . Iron deficiency anemia due to chronic blood loss   . Myofascial pain   . Panic attack   . PONV (postoperative nausea and vomiting)   . Psoriatic  arthritis ([place])   . Right inguinal hernia 08/16/2012  . Seizures ([place]) 1997   1 seizure from coil insertion but no more seizures and no meds for seizures  . Sjogren's disease ([place])   . [place] HEAD 02/03/2007   Qualifier: Diagnosis of  By: Aline Brochure MD, Dorothyann Peng    . 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(HCC)     PSH:  Past Surgical History:  Procedure Laterality Date  . AGILE CAPSULE N/A 06/21/2014   Procedure: AGILE CAPSULE;  Surgeon: Danie Binder, MD;  Location: [facility];  Service: Endoscopy;  Laterality: N/A;  0700  . BIOPSY  10/13/2011   SLF: mild gastritis/duodenal polypoid lesion   . CEREBRAL ANEURYSM REPAIR  1997  . COLONOSCOPY  10/13/2011   EHM:CNOBSJGG hemorrhoids/varices rectal/lesion in ascending colon(HYPERPLASTIC POLYP)  . COLONOSCOPY N/A 01/17/2013   SLF: 1. Internal hemorrhoids 2. Ascending colon polyps 3.  Rectal varices.   . COLONOSCOPY N/A 08/10/2016   Dr. Oneida Alar: redundant left colon. external and internal hemorhroids. 5-10 year surveillance  . ESOPHAGOGASTRODUODENOSCOPY N/A 06/29/2014   SLF: 1. anemia due to polypoid lesions in teh antrum, and duodenal polyps 2. single duodenum AVM  . FLEXIBLE SIGMOIDOSCOPY N/A 01/21/2016   Procedure: FLEXIBLE SIGMOIDOSCOPY;  Surgeon: Danie Binder, MD;  [facility];  Service: Endoscopy;  Laterality: N/A;  245  . GIVENS CAPSULE STUDY N/A 06/29/2014   Procedure: GIVENS CAPSULE STUDY;  Surgeon: Danie Binder, MD;  [facility]: [facility];  Service: Endoscopy;  Laterality: N/A;  . GIVENS CAPSULE STUDY N/A 07/13/2014   Procedure: GIVENS CAPSULE STUDY;  Surgeon: Danie Binder, MD;  [facility]: [facility];  Service: Endoscopy;  Laterality: N/A;  700  . HEMORRHOID BANDING N/A 01/21/2016   Procedure: HEMORRHOID BANDING;  Surgeon: Danie Binder, MD;  [facility];  Service: Endoscopy;  Laterality: N/A;  . INGUINAL HERNIA REPAIR Right 08/28/2013   Procedure: RIGHT NGUINAL HERNIORRHAPHY WITH MESH;   Surgeon: Jamesetta So, MD;  Location: [facility] OR[facility];  Service: General;  Laterality: Right;  . INSERTION OF MESH N/A 06/09/2013   Procedure: INSERTION OF MESH;  Surgeon: Jamesetta So, MD;  Location: [facility];  Service: General;  Laterality: N/A;  . INSERTION OF MESH Right 08/28/2013   Procedure: INSERTION OF MESH;  Surgeon: Jamesetta So, MD;  Location: [facility]  ORS;  Service: General;  Laterality: Right;  . PARACENTESIS  Feb 2014   10.7 liters  . TOOTH EXTRACTION Left 12/12/14  . UMBILICAL HERNIA REPAIR N/A 06/09/2013   Procedure: UMBILICAL HERNIORRHAPHY WITH MESH;  Surgeon: Jamesetta So, MD;  Location: [facility];  Service: General;  Laterality: N/A;    Social History:  Social History   Socioeconomic History  . Marital status: Married    Spouse name: Not on file  . Number of children: 0  . Years of education: Not on file  . Highest education level: Not on file  Occupational History    Employer: RETIRED  Social Needs  . Financial resource strain: Not on file  . Food insecurity:    Worry: Not on file    Inability: Not on file  . Transportation needs:    Medical: Not on file    Non-medical: Not on file  Tobacco Use  . Smoking status: Former Smoker    Packs/day: 2.00    Years: 12.00    Pack years: 24.00    Types: Cigarettes    Last attempt to quit: 03/31/1995    Years since quitting: 22.6  . Smokeless tobacco: Never Used  . Tobacco comment: quit in 1997  Substance and Sexual Activity  . Alcohol use: No    Comment: Bottle of wine a day for years, but quit in 07/2011  . Drug use: No  . Sexual activity: Not Currently    Birth control/protection: Post-menopausal  Lifestyle  . Physical activity:    Days per week: Not on file    Minutes per session: Not on file  . Stress: Not on file  Relationships  . Social connections:    Talks on phone: Not on file    Gets together: Not on file    Attends religious service: Not on file    Active member of club or organization: Not on file    Attends  meetings of clubs or organizations: Not on file    Relationship status: Not on file  . Intimate partner violence:    Fear of current or ex partner: Not on file    Emotionally abused: Not on file    Physically abused: Not on file    Forced sexual activity: Not on file  Other Topics Concern  . Not on file  Social History Narrative   Lives   Caffeine use:    Quit drinking alcohol 3 YEARS ago. No drugs, no IVDU.     Family History:  Family History  Problem Relation Age of Onset  . Cirrhosis Mother        nash  . COPD Father   . Heart disease Father   . Asthma Unknown   . Heart disease Unknown   . Colon cancer Neg Hx   . Inflammatory bowel disease Neg Hx     Medications:   Current Outpatient Medications on File Prior to Visit  Medication Sig Dispense Refill  . aspirin EC 81 MG EC tablet Take 1 tablet (81 mg total) by mouth daily. 30 tablet 0  . atorvastatin (LIPITOR) 40 MG tablet TAKE (1) TABLET BY MOUTH ONCE DAILY. 30 tablet 5  . bumetanide (BUMEX) 1 MG tablet TAKE (1) TABLET BY MOUTH (2) TIMES DAILY. (Patient taking differently: Take 1 mg by mouth 2 (two) times daily. ) 180 tablet 3  . buPROPion (WELLBUTRIN SR) 100 MG 12 hr tablet Take 1 tablet (100 mg total) by mouth 2 (two) times daily. 60 tablet  5  . docusate sodium (COLACE) 100 MG capsule Take 100 mg by mouth daily.     Marland Kitchen escitalopram (LEXAPRO) 20 MG tablet TAKE (1) TABLET BY MOUTH AT BEDTIME. 30 tablet 5  . fluticasone (FLONASE) 50 MCG/ACT nasal spray Place 1 spray into both nostrils See admin instructions. Instill 1 spray into each nostril every morning, may also use later in the day as needed for sinus congestion    . hydrocortisone cream 1 % Apply 1 application topically 2 (two) times daily as needed (eczema in ears).    Marland Kitchen Lifitegrast (XIIDRA) 5 % SOLN Place 1 drop into both eyes 2 (two) times daily.    Marland Kitchen linaclotide (LINZESS) 145 MCG CAPS capsule Take 1 capsule (145 mcg total) by mouth daily before breakfast. 30 capsule  11  . loratadine (CLARITIN) 10 MG tablet Take 10 mg by mouth daily.    Marland Kitchen losartan (COZAAR) 25 MG tablet TAKE (1) TABLET BY MOUTH ONCE DAILY. 30 tablet 5  . meclizine (ANTIVERT) 25 MG tablet Take 1 tablet (25 mg total) by mouth 3 (three) times daily as needed for dizziness. 20 tablet 0  . Multiple Vitamin (MULTIVITAMIN WITH MINERALS) TABS tablet Take 1 tablet by mouth daily.    . ondansetron (ZOFRAN) 4 MG tablet Take 1 tablet (4 mg total) by mouth every 8 (eight) hours as needed for nausea or vomiting. 12 tablet 0  . Polyvinyl Alcohol-Povidone (REFRESH OP) Place 1 drop into both eyes 3 (three) times daily as needed (dry eyes).    . predniSONE (DELTASONE) 20 MG tablet Take 60 mg daily x 2 days then 40 mg daily x 2 days then 20 mg daily x 2 days 12 tablet 0  . ranitidine (ZANTAC) 150 MG tablet TAKE (1) TABLET BY MOUTH TWICE DAILY. 180 tablet 5  . Simethicone (GAS-X PO) Take 1 tablet by mouth 2 (two) times daily as needed (bloating/flatulence).    . sodium chloride (OCEAN) 0.65 % nasal spray Place 1 spray into the nose 2 (two) times daily as needed for congestion.     Marland Kitchen spironolactone (ALDACTONE) 100 MG tablet TAKE (1) TABLET BY MOUTH TWICE DAILY. 180 tablet 5   No current facility-administered medications on file prior to visit.     Allergies:   Allergies  Allergen Reactions  . Omeprazole Shortness Of Breath  . Orencia [Abatacept] Anaphylaxis, Hives and Rash  . Other Shortness Of Breath and Rash    Red Meat Causes shortness of breath, rash and flu like symptoms.   . Phenytoin Rash  . Keflex [Cephalexin] Other (See Comments)    Severe yeast infection  . Phenergan [Promethazine Hcl] Other (See Comments)    Not in right state of mind, HALLUCINATION     Physical Exam  Vitals:   11/02/17 0935  BP: 118/70  Pulse: 79  SpO2: 98%  Weight: 230 lb 9.6 oz (104.6 kg)  Height: 5\' 8"  (1.727 m)   Body mass index is 35.06 kg/m. No exam data present  General: well developed, pleasant  middle-aged Caucasian female, well nourished, seated, in no evident distress Head: head normocephalic and atraumatic.   Neck: supple with no carotid or supraclavicular bruits Cardiovascular: regular rate and rhythm, no murmurs Musculoskeletal: no deformity Skin:  no rash/petichiae Vascular:  Normal pulses all extremities  Neurologic Exam Mental Status: Awake and fully alert.  Chronic mild expressive aphasia intermittently.  Oriented to place and time. Recent and remote memory intact.  Attention span, concentration and fund of knowledge appropriate. Mood  and affect appropriate.  Cranial Nerves: Fundoscopic exam reveals sharp disc margins. Pupils equal, briskly reactive to light. Extraocular movements full without nystagmus. Visual fields full to confrontation. Hearing intact. Facial sensation intact. Face, tongue, palate moves normally and symmetrically.  Motor: Normal bulk and tone. Normal strength in all tested extremity muscles. Sensory.: intact to touch , pinprick , position and vibratory sensation.  Coordination: Rapid alternating movements normal in all extremities. Finger-to-nose and heel-to-shin performed accurately bilaterally. Gait and Station: Arises from chair without difficulty. Stance is normal. Gait demonstrates normal stride length and balance .  Reflexes: 1+ and symmetric. Toes downgoing.     Diagnostic Data (Labs, Imaging, Testing)  CT head WO contrast (RECENT) 10/28/2017 IMPRESSION: Status post basilar aneurysm clip with stable right-sided encephalomalacic changes. No evidence of acute intracranial abnormality.   CT HEAD WO CONTRAST 07/31/2017 IMPRESSION: Head CT negative for acute intracranial abnormality.  CT ANGIO HEAD W OR WO CONTRAST CT ANGIO NECK W OR WO CONTRAST 07/31/2017 IMPRESSION: 1. Negative CTA for large vessel occlusion. 2. Treated basilar apex aneurysm. No remnant or CTA evidence for recanalization identified. 3. 3 mm untreated left MCA bifurcation  aneurysm. 4. Single short-segment severe distal left V2 stenosis. Vertebral arteries otherwise widely patent. Right vertebral artery dominant. 5. Atherosclerotic change about the carotid bifurcations and within the carotid siphons without high-grade stenosis.  CT HEAD WO CONTRAST (REPEAT) 08/02/2017 IMPRESSION: 1. No acute intracranial abnormality identified. 2. Stable chronic postsurgical changes related to right pterional craniotomy and subjacent encephalomalacia. Basilar aneurysm clip noted.  ECHOCARDIOGRAM 08/01/2017 Study Conclusions - Left ventricle: The cavity size was normal. There was mild   concentric hypertrophy. Systolic function was mildly reduced. The   estimated ejection fraction was in the range of 45% to 50%.   Diffuse hypokinesis. Doppler parameters are consistent with   abnormal left ventricular relaxation (grade 1 diastolic   dysfunction). - Aortic valve: Transvalvular velocity was increased, due to   stenosis. There was very mild stenosis. There was trivial   regurgitation. - Mitral valve: There was mild regurgitation. - Left atrium: The atrium was moderately to severely dilated. - Right atrium: The atrium was mildly dilated.       ASSESSMENT: Deanna Thompson is a 62 y.o. year old female here with possible right brainstem infarct on 07/31/2017. Vascular risk factors include HTN and HLD.  Patient returns today with possible TIA on 10/28/2017 after worsening in vision.  Since this time, increased "cluster symptoms" as described above in HPI.    PLAN: -Recommend increase aspirin 81 mg daily to aspirin 325 mg EC daily and continue Lipitor for secondary stroke prevention -Check lipid panel today as this has not been evaluated since 07/2017 -advised if results are normal, they will be released to MyChart when available but will be called if change in treatment plan as needed -Agree with plan to restart physical therapy as a symptoms possibly could be related to  BPPV -Continue to stay active and eat a healthy diet  -We will discuss plan with Dr. Erlinda Hong to discuss possible need of further imaging as patient was seen by him in hospital on 07/2017.  Unsure if this is related to basilar artery pathology along with previous coiling and clipping.  It was discussed during hospitalization for possible DSA with Dr. Estanislado Pandy but was not done at that time as it would not of changed management at that time and clinical symptoms improved - ADDENDUM: after speaking with Dr. Erlinda Hong, he believes that these symptoms are more related  to anxiety and fear of recurrent symptoms due to non-specific complaints - no need for additional images and recommend relaxation techniques and stress reduction -F/u with PCP regarding your HLD and HTN management -continue to monitor BP at home -Maintain strict control of hypertension with blood pressure goal below 130/90, diabetes with hemoglobin A1c goal below 6.5% and cholesterol with LDL cholesterol (bad cholesterol) goal below 70 mg/dL. I also advised the patient to eat a healthy diet with plenty of whole grains, cereals, fruits and vegetables, exercise regularly and maintain ideal body weight.  Follow up as previously scheduled on 01/03/2018 or call earlier if needed   Greater than 50% of time during this 25 minute visit was spent on counseling,explanation of diagnosis of possible brainstem infarct, reviewing risk factor management of HLD and HTN, planning of further management, discussion with patient and family and coordination of care  Venancio Poisson, [facility]  [facility] [address] [place], [place] [address]  Phone [phone] Fax [phone]

## 2017-11-02 NOTE — Patient Instructions (Addendum)
Increase aspirin 325 mg daily EC and atorvastatin for secondary stroke prevention  Check lipid panel and it will be released to MyChart tomorrow - possible medication adjustment needed   Continue to follow up with PCP regarding cholesterol and blood pressure management   I will discuss with Dr. Erlinda Hong regarding possible follow up imaging if needed - we will call you to update  Continue to monitor blood pressure at home  Maintain strict control of hypertension with blood pressure goal below 130/90, diabetes with hemoglobin A1c goal below 6.5% and cholesterol with LDL cholesterol (bad cholesterol) goal below 70 mg/dL. I also advised the patient to eat a healthy diet with plenty of whole grains, cereals, fruits and vegetables, exercise regularly and maintain ideal body weight.  Follow up with me as scheduled or call earlier if needed       Thank you for coming to see [place] at [facility]. I hope we have been able to provide you high quality care today.  You may receive a patient satisfaction survey over the next few weeks. We would appreciate your feedback and comments so that we may continue to improve ourselves and the health of our patients.

## 2017-11-03 LAB — LIPID PANEL
CHOLESTEROL TOTAL: 146 mg/dL (ref 100–199)
Chol/HDL Ratio: 2.1 ratio (ref 0.0–4.4)
HDL: 69 mg/dL (ref >39)
LDL CALC: 47 mg/dL (ref 0–99)
TRIGLYCERIDES: 149 mg/dL (ref 0–149)
VLDL CHOLESTEROL CAL: 30 mg/dL (ref 5–40)

## 2017-11-05 ENCOUNTER — Telehealth: Payer: Self-pay

## 2017-11-05 ENCOUNTER — Encounter

## 2017-11-05 NOTE — Telephone Encounter (Signed)
Dr. Leonie Man will review pts chart when he returns on Tuesday. Pt was seen by Janett Billow NP on 11/02/2017. Do not schedule at this time until he reviews the chart thanks.

## 2017-11-08 ENCOUNTER — Ambulatory Visit: Payer: Commercial Managed Care - PPO | Attending: Family Medicine | Admitting: Physical Therapy

## 2017-11-08 ENCOUNTER — Encounter: Payer: Self-pay | Admitting: Physical Therapy

## 2017-11-08 DIAGNOSIS — R29818 Other symptoms and signs involving the nervous system: Secondary | ICD-10-CM | POA: Insufficient documentation

## 2017-11-08 DIAGNOSIS — H8113 Benign paroxysmal vertigo, bilateral: Secondary | ICD-10-CM | POA: Diagnosis not present

## 2017-11-08 NOTE — Therapy (Signed)
[facility] [address] [place], [place], [address] Phone: [phone]   Fax:  [phone]  Physical Therapy Treatment  Patient Details  Name: Deanna Thompson MRN: [id number] Date of Birth: 1955/10/25 Referring Provider: Venancio Poisson, NP   Encounter Date: 11/08/2017  PT End of Session - 11/08/17 1225    Visit Number  5    Number of Visits  5    Date for PT Re-Evaluation  11/05/17    Authorization Type  UHC, UMR 30 VL    Authorization Time Period  NA    Authorization - Visit Number  15   includes 10 visits prior episode of PT 5-08/2017   Authorization - Number of Visits  30   pt, ot, slp   PT Start Time  1146    PT Stop Time  7353   pt did not require full session   PT Time Calculation (min)  36 min    Activity Tolerance  Patient tolerated treatment well    Behavior During Therapy  [facility] for tasks assessed/performed       Past Medical History:  Diagnosis Date  . Abdominal hernia   . Abscess of pulp of tooth 12/12/14   left side  . Anxiety   . Arthritis    back  and down flank  right side more than left  . B12 deficiency 06/12/2015  . Brain aneurysm    METAL COIL PREVENTS MRI IMAGING and CLIP  . Cirrhosis ([place])    Presumably alcoholic cirrhosis  . Complication of anesthesia   . Depression   . ETOH abuse    Quit in 07/2011   . Fatigue    due to Sjogren's disease  . Folate deficiency 06/30/2016  . GERD (gastroesophageal reflux disease)   . HTN (hypertension)    no longer medicated, dated on 04/25/12  . IBS (irritable bowel syndrome)   . Iron deficiency anemia due to chronic blood loss   . Myofascial pain   . Panic attack   . PONV (postoperative nausea and vomiting)   . Psoriatic arthritis ([place])   . Right inguinal hernia 08/16/2012  . Seizures ([place]) 1997   1 seizure from coil insertion but no more seizures and no meds for seizures  . Sjogren's disease ([place])   . [place] HEAD 02/03/2007    Qualifier: Diagnosis of  By: Aline Brochure MD, Dorothyann Peng    . 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([place])     Past Surgical History:  Procedure Laterality Date  . AGILE CAPSULE N/A 06/21/2014   Procedure: AGILE CAPSULE;  Surgeon: Danie Binder, MD;  [facility];  Service: Endoscopy;  Laterality: N/A;  0700  . BIOPSY  10/13/2011   SLF: mild gastritis/duodenal polypoid lesion   . CEREBRAL ANEURYSM REPAIR  1997  . COLONOSCOPY  10/13/2011   GDJ:MEQASTMH hemorrhoids/varices rectal/lesion in ascending colon(HYPERPLASTIC POLYP)  . COLONOSCOPY N/A 01/17/2013   SLF: 1. Internal hemorrhoids 2. Ascending colon polyps 3.  Rectal varices.   . COLONOSCOPY N/A 08/10/2016   Dr. Oneida Alar: redundant left colon. external and internal hemorhroids. 5-10 year surveillance  . ESOPHAGOGASTRODUODENOSCOPY N/A 06/29/2014   SLF: 1. anemia due to polypoid lesions in teh antrum, and duodenal polyps 2. single duodenum AVM  . FLEXIBLE SIGMOIDOSCOPY N/A 01/21/2016   Procedure: FLEXIBLE SIGMOIDOSCOPY;  Surgeon: Danie Binder, MD;  Location: [facility];  Service: Endoscopy;  Laterality: N/A;  245  . GIVENS CAPSULE STUDY N/A 06/29/2014   Procedure: GIVENS CAPSULE STUDY;  Surgeon: Danie Binder, MD;  Location: [facility];  Service: Endoscopy;  Laterality: N/A;  . GIVENS CAPSULE STUDY N/A 07/13/2014   Procedure: GIVENS CAPSULE STUDY;  Surgeon: Danie Binder, MD;  [facility]: [facility];  Service: Endoscopy;  Laterality: N/A;  700  . HEMORRHOID BANDING N/A 01/21/2016   Procedure: HEMORRHOID BANDING;  Surgeon: Danie Binder, MD;  Location: [facility];  Service: Endoscopy;  Laterality: N/A;  . INGUINAL HERNIA REPAIR Right 08/28/2013   Procedure: RIGHT NGUINAL HERNIORRHAPHY WITH MESH;  Surgeon: Jamesetta So, MD;  Location: [facility] ORS;  Service: General;  Laterality: Right;  . INSERTION OF MESH N/A 06/09/2013   Procedure: INSERTION OF MESH;  Surgeon: Jamesetta So, MD;  Location: [facility] ORS;  Service: General;  Laterality:  N/A;  . INSERTION OF MESH Right 08/28/2013   Procedure: INSERTION OF MESH;  Surgeon: Jamesetta So, MD;  Location: [facility] ORS;  Service: General;  Laterality: Right;  . PARACENTESIS  Feb 2014   10.7 liters  . TOOTH EXTRACTION Left 12/12/14  . UMBILICAL HERNIA REPAIR N/A 06/09/2013   Procedure: UMBILICAL HERNIORRHAPHY WITH MESH;  Surgeon: Jamesetta So, MD;  Location: [facility] ORS;  Service: General;  Laterality: N/A;    There were no vitals filed for this visit.  Subjective Assessment - 11/08/17 1145    Subjective  Last Wed, woke up and felt her eyes were different (1/2 step backwards). Talked to neurology and told to go to the ED. Thought it was a TIA, but could not confirm due to could not do imaging. Later in week saw NP at neuro and eventulally discussed it being related to anxiety. Which did not sit well with her. Since then nausea has gotten better. Has been haivng lightheadedness when she stands up (this in new).     Patient is accompained by:  Family member    Pertinent History  HTN, ETOH abuse, cirrhosis, brain aneurysm, Sjogren's disease, basilar aneurysm clip and coiling, Lt MCA aneurysm    Diagnostic tests  CT scans x 2 (cannot do MRI becasue of the clips & coils);     Patient Stated Goals  stop having dizziness and get back to walking without LOB        Self Care/Education- Reviewed her recent constellation of symptoms and the fact that none sound related to BPPV, however could be related to her recent CVA with ?TIA. Her vision has now returned to baseline (or better) compared to when she went to the ED. The VORx1 exercise and tracking horizontally exercise from HEP do not cause increased symptoms. Positional testing for BPPV negative today. The lightheadedness she recently started having could be related to her BP and she endorses recent change to her BP meds. She has a home BP monitor but does not feel it works properly and plans to get a new one. Educated to get one that fits her upper arm  and not just at her wrist for improved accuracy.   Reviewed symptoms related to posterior circulation CVA and pt able to verbalize difference between BPPV symptoms (seconds, stops with immobility) vs CVA/central (lasts for minutes to hours without relief). Patient able to state to call 911 if believes she might be having a CVA.                                 PT Long Term Goals - 11/08/17 1226  PT LONG TERM GOAL #1   Title  Patient will be independent with updated HEP upon d/c from PT. (Target for all LTGs 11/05/2017)    Time  4    Period  Weeks    Status  Achieved      PT LONG TERM GOAL #2   Title  Patient will have negative positional testing for BPPV.     Baseline  11/08/17 negative rt and lt     Time  4    Period  Weeks    Status  Achieved      PT LONG TERM GOAL #3   Title  Patient will be able to verbalize warning signs of CVA (including posterior circulation CVAs) and appropriate steps to take if CVA suspected.     Baseline  11/08/17    Time  4    Period  Weeks    Status  Achieved            Plan - 11/08/17 1231    Clinical Impression Statement  Patient has very complicated medical history (basilar aneurysm, BPPV, brainstem CVA) with recent recurrence of BPPV. Since last seen for OPPT, pt had an anaphylactic reaction to a new medication with trip to ED and several days later noted changes in her vision and again to ED with ?TIA vs anxiety. On return today, pt with improved vision, however some nausea, "puffy" head feeling, and "the beginning of a headache." Positional testing for BPPV negative. Reviewed patient's current symptoms and she is NOT having double vision, or "blips" in her vision (where it feels like when she turns her head, the vision lags and then catches up). Educated that if the vestibular exercises are not causing increased symptoms (as she reports) that she no longer needs to do them. Again reviewed symptoms of CVA (especially  posterior circulation) and appropriate course of action (pt able to verbalize call 911 and not try to drive herself). LTGs assessed with pt meeting 3 of 3 goals. Pt agrees to cancel future PT appointments however does not want to be discharged in case of recurrent BPPV (which is what happened when she was recently d/c'd from PT and had to get new referral from MD to get into clinic). Educated her referral is good for 6 months from 10/04/17.     Rehab Potential  Good    Clinical Impairments Affecting Rehab Potential  anxiety, mixed peripheral and central vestibular deficits    PT Frequency  1x / week    PT Duration  4 weeks    PT Treatment/Interventions  ADLs/Self Care Home Management;Canalith Repostioning;Therapeutic activities;Therapeutic exercise;Balance training;Neuromuscular re-education;Cognitive remediation;Patient/family education;Passive range of motion;Vestibular;Visual/perceptual remediation/compensation;Biofeedback    PT Next Visit Plan  --    PT Home Exercise Plan  from recent PT episode of care:  VORx1; corner on pillow, feet partial tandem, EO head turns, EC head neutral; walking diagonal head turns; eye tracking horiz and vertical; tandem walk; backward walk; SLS    Consulted and Agree with Plan of Care  Patient       Patient will benefit from skilled therapeutic intervention in order to improve the following deficits and impairments:  Abnormal gait, Decreased activity tolerance, Decreased balance, Decreased cognition, Decreased mobility, Decreased knowledge of use of DME, Dizziness, Impaired vision/preception, Difficulty walking  Visit Diagnosis: BPPV (benign paroxysmal positional vertigo), bilateral  Other symptoms and signs involving the nervous system     Problem List Patient Active Problem List   Diagnosis Date Noted  . Benign  paroxysmal positional vertigo   . 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 Essential hypertension 07/31/2017  . GERD (gastroesophageal reflux disease)  07/31/2017  . Alcohol abuse 07/31/2017  . Brain aneurysm 07/31/2017  . CKD (chronic kidney disease), stage III ([place]) 07/31/2017  . LBBB (left bundle branch block) 07/31/2017  . Dizziness 07/31/2017  . Nausea & vomiting 07/31/2017  . Folate deficiency 06/30/2016  . Constipation 04/01/2016  . 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 Hemorrhoids, internal, with bleeding 01/08/2016  . B12 deficiency 06/12/2015  . Psoriatic arthritis ([place]) 04/15/2015  . Nutritional anemia 09/19/2014  . Iron deficiency anemia due to chronic blood loss   . Insomnia 03/17/2014  . Depression 02/18/2014  . Labial swelling 08/23/2012  . Rash and nonspecific skin eruption 06/30/2012  . Colon polyps 11/18/2011  . Cirrhosis ([place]) 08/19/2011    Rexanne Mano, PT 11/08/2017, 1:29 PM  [facility] [address] [place], [place], [address] Phone: [phone]   Fax:  [phone]  Name: Deanna Thompson MRN: [id number] Date of Birth: Feb 20, 1956

## 2017-11-11 NOTE — Progress Notes (Signed)
I agree with the above plan 

## 2017-11-15 ENCOUNTER — Encounter: Payer: Commercial Managed Care - PPO | Admitting: Physical Therapy

## 2017-11-15 ENCOUNTER — Telehealth: Payer: Self-pay

## 2017-11-15 NOTE — Telephone Encounter (Signed)
Decrease spironolactone to 50 mg BID if she was tolerating better at that dosage. Reviewed notes from May 2019 and cardiology recommended reducing while on losartan in light of hypotension. Monitor for lower extremity edema, abdominal swelling, etc.

## 2017-11-15 NOTE — Telephone Encounter (Signed)
Pt said her BP runs no higher than 120/70 and no lower than 98/68. She was just concerned because when it is lower her vision is a little gray but doesn't last but few min. She is still taking the Bumex 1 mg bid ( prescribed by Dr. Oneida Alar).  She had a stroke in May that that is when Losartan and Lipitor was added.  The spironolactone was decreased then to 50 mg bid. Then she went to a heart doctor and he increased it back to 100 mg bid. She did not like that doctor and she has a new patient appt with Dr. Harl Bowie on 11/19/2017.  She said she is fine without any changes until she sees him. Please advise!

## 2017-11-15 NOTE — Telephone Encounter (Signed)
LMOM to call.

## 2017-11-15 NOTE — Telephone Encounter (Signed)
How often has it been running low? When was it last at 50 mg BID? Isn't she also on Bumex? Need to find that out. Please have her call her PCP as well or whomever prescribed the losartan.

## 2017-11-15 NOTE — Telephone Encounter (Signed)
Pt called to say her BP has been running low some around 98/68. She feels her spironolactone needs decreasing again to 50 bid, but she did not want to do it without advise. She now takes Spironolactone 100 mg bid and Losartan 50 mg daily. I am forwarding to Roseanne Kaufman, NP who is doing hospital rounds today. Dr. Oneida Alar is off today.  Vicente Males, please advise!

## 2017-11-16 ENCOUNTER — Encounter: Payer: Self-pay | Admitting: Gastroenterology

## 2017-11-16 NOTE — Telephone Encounter (Signed)
Pt is aware.  

## 2017-11-17 ENCOUNTER — Other Ambulatory Visit: Payer: Self-pay

## 2017-11-17 DIAGNOSIS — K7460 Unspecified cirrhosis of liver: Secondary | ICD-10-CM

## 2017-11-19 ENCOUNTER — Encounter

## 2017-11-19 ENCOUNTER — Encounter: Payer: Self-pay | Admitting: Cardiology

## 2017-11-19 ENCOUNTER — Ambulatory Visit (INDEPENDENT_AMBULATORY_CARE_PROVIDER_SITE_OTHER): Payer: Commercial Managed Care - PPO | Admitting: Cardiology

## 2017-11-19 VITALS — BP 120/66 | HR 81 | Ht 68.0 in | Wt 235.0 lb

## 2017-11-19 DIAGNOSIS — I5032 Chronic diastolic (congestive) heart failure: Secondary | ICD-10-CM

## 2017-11-19 DIAGNOSIS — I447 Left bundle-branch block, unspecified: Secondary | ICD-10-CM

## 2017-11-19 DIAGNOSIS — E782 Mixed hyperlipidemia: Secondary | ICD-10-CM | POA: Diagnosis not present

## 2017-11-19 NOTE — Progress Notes (Signed)
Clinical Summary Ms. Pha is a 62 y.o.female previously seen by Dr Doylene Canard. She is seen today as a new patient for the following medical problems  1. Chronic diastolic heart failure - 09/3708 echo LVEF 45-50%, diffuse hypokinesis, grade I diastolic dysfunction - volume also related to history of cirrhosis. Has been on bumex and aldactone.  - aldactone recently lowered to 50mg  bid 2 days ago for orthostatic symptoms.  - no recent LE edema, has had some abdomonal distension. Reports 3 lbs weigh gain since May.    2. Chronic LBBB - newly detected during 07/2017 admission - 07/2017 echo LVEF 45-50%, diffuse hypokinesis, grade I diastolic dysfunction  - no recent chest pain.   3. History of CVA   4. History of brain aneurysms   5. Valvular heart disease - previous clinic notes refer to history of mild AS and MR.  - 07/2017 echo mild MR, mild AS (gradients/area not reported. Study indepnendly reviewed, mean gradient of 5. More consistent with aortic sclerosis as opposed to stenosis).     6. Sjogrens syndrome   7. Cirrhosis - followed by GI  8. Vertigo  9. Hyperlipidemia - she is on atorvastatin 40mg  daily    Past Medical History:  Diagnosis Date  . Abdominal hernia   . Abscess of pulp of tooth 12/12/14   left side  . Anxiety   . Arthritis    back  and down flank  right side more than left  . B12 deficiency 06/12/2015  . Brain aneurysm    METAL COIL PREVENTS MRI IMAGING and CLIP  . Cirrhosis ([place])    Presumably alcoholic cirrhosis  . Complication of anesthesia   . Depression   . ETOH abuse    Quit in 07/2011   . Fatigue    due to Sjogren's disease  . Folate deficiency 06/30/2016  . GERD (gastroesophageal reflux disease)   . HTN (hypertension)    no longer medicated, dated on 04/25/12  . IBS (irritable bowel syndrome)   . Iron deficiency anemia due to chronic blood loss   . Myofascial pain   . Panic attack   . PONV (postoperative nausea and vomiting)   .  Psoriatic arthritis ([place])   . Right inguinal hernia 08/16/2012  . Seizures ([place]) 1997   1 seizure from coil insertion but no more seizures and no meds for seizures  . Sjogren's disease ([place])   . [place] HEAD 02/03/2007   Qualifier: Diagnosis of  By: Aline Brochure MD, Dorothyann Peng    . 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([facility])      Allergies  Allergen Reactions  . Omeprazole Shortness Of Breath  . Orencia [Abatacept] Anaphylaxis, Hives and Rash  . Other Shortness Of Breath and Rash    Red Meat Causes shortness of breath, rash and flu like symptoms.   . Phenytoin Rash  . Keflex [Cephalexin] Other (See Comments)    Severe yeast infection  . Phenergan [Promethazine Hcl] Other (See Comments)    Not in right state of mind, HALLUCINATION     Current Outpatient Medications  Medication Sig Dispense Refill  . aspirin EC 81 MG EC tablet Take 1 tablet (81 mg total) by mouth daily. 30 tablet 0  . atorvastatin (LIPITOR) 40 MG tablet TAKE (1) TABLET BY MOUTH ONCE DAILY. 30 tablet 5  . bumetanide (BUMEX) 1 MG tablet TAKE (1) TABLET BY MOUTH (2) TIMES DAILY. (Patient taking differently: Take 1 mg by mouth 2 (two) times daily. ) 180 tablet 3  .  buPROPion (WELLBUTRIN SR) 100 MG 12 hr tablet Take 1 tablet (100 mg total) by mouth 2 (two) times daily. 60 tablet 5  . docusate sodium (COLACE) 100 MG capsule Take 100 mg by mouth daily.     Marland Kitchen escitalopram (LEXAPRO) 20 MG tablet TAKE (1) TABLET BY MOUTH AT BEDTIME. 30 tablet 5  . fluticasone (FLONASE) 50 MCG/ACT nasal spray Place 1 spray into both nostrils See admin instructions. Instill 1 spray into each nostril every morning, may also use later in the day as needed for sinus congestion    . hydrocortisone cream 1 % Apply 1 application topically 2 (two) times daily as needed (eczema in ears).    Marland Kitchen Lifitegrast (XIIDRA) 5 % SOLN Place 1 drop into both eyes 2 (two) times daily.    Marland Kitchen linaclotide (LINZESS) 145 MCG CAPS capsule Take 1 capsule (145 mcg  total) by mouth daily before breakfast. 30 capsule 11  . loratadine (CLARITIN) 10 MG tablet Take 10 mg by mouth daily.    Marland Kitchen losartan (COZAAR) 25 MG tablet TAKE (1) TABLET BY MOUTH ONCE DAILY. 30 tablet 5  . meclizine (ANTIVERT) 25 MG tablet Take 1 tablet (25 mg total) by mouth 3 (three) times daily as needed for dizziness. 20 tablet 0  . Multiple Vitamin (MULTIVITAMIN WITH MINERALS) TABS tablet Take 1 tablet by mouth daily.    . ondansetron (ZOFRAN) 4 MG tablet Take 1 tablet (4 mg total) by mouth every 8 (eight) hours as needed for nausea or vomiting. 12 tablet 0  . Polyvinyl Alcohol-Povidone (REFRESH OP) Place 1 drop into both eyes 3 (three) times daily as needed (dry eyes).    . predniSONE (DELTASONE) 20 MG tablet Take 60 mg daily x 2 days then 40 mg daily x 2 days then 20 mg daily x 2 days 12 tablet 0  . ranitidine (ZANTAC) 150 MG tablet TAKE (1) TABLET BY MOUTH TWICE DAILY. 180 tablet 5  . Simethicone (GAS-X PO) Take 1 tablet by mouth 2 (two) times daily as needed (bloating/flatulence).    . sodium chloride (OCEAN) 0.65 % nasal spray Place 1 spray into the nose 2 (two) times daily as needed for congestion.     Marland Kitchen spironolactone (ALDACTONE) 100 MG tablet TAKE (1) TABLET BY MOUTH TWICE DAILY. 180 tablet 5   No current facility-administered medications for this visit.      Past Surgical History:  Procedure Laterality Date  . AGILE CAPSULE N/A 06/21/2014   Procedure: AGILE CAPSULE;  Surgeon: Danie Binder, MD;  [facility];  Service: Endoscopy;  Laterality: N/A;  0700  . BIOPSY  10/13/2011   SLF: mild gastritis/duodenal polypoid lesion   . CEREBRAL ANEURYSM REPAIR  1997  . COLONOSCOPY  10/13/2011   WUJ:[id number] hemorrhoids/varices rectal/lesion in ascending colon(HYPERPLASTIC POLYP)  . COLONOSCOPY N/A 01/17/2013   SLF: 1. Internal hemorrhoids 2. Ascending colon polyps 3.  Rectal varices.   . COLONOSCOPY N/A 08/10/2016   Dr. Oneida Alar: redundant left colon. external and internal  hemorhroids. 5-10 year surveillance  . ESOPHAGOGASTRODUODENOSCOPY N/A 06/29/2014   SLF: 1. anemia due to polypoid lesions in teh antrum, and duodenal polyps 2. single duodenum AVM  . FLEXIBLE SIGMOIDOSCOPY N/A 01/21/2016   Procedure: FLEXIBLE SIGMOIDOSCOPY;  Surgeon: Danie Binder, MD;  [facility];  Service: Endoscopy;  Laterality: N/A;  245  . GIVENS CAPSULE STUDY N/A 06/29/2014   Procedure: GIVENS CAPSULE STUDY;  Surgeon: Danie Binder, MD;  [facility];  Service: Endoscopy;  Laterality: N/A;  . GIVENS CAPSULE STUDY N/A 07/13/2014   Procedure: GIVENS CAPSULE STUDY;  Surgeon: Danie Binder, MD;  [facility];  Service: Endoscopy;  Laterality: N/A;  700  . HEMORRHOID BANDING N/A 01/21/2016   Procedure: HEMORRHOID BANDING;  Surgeon: Danie Binder, MD;  [facility];  Service: Endoscopy;  Laterality: N/A;  . INGUINAL HERNIA REPAIR Right 08/28/2013   Procedure: RIGHT NGUINAL HERNIORRHAPHY WITH MESH;  Surgeon: Jamesetta So, MD;  Location: [facility];  Service: General;  Laterality: Right;  . INSERTION OF MESH N/A 06/09/2013   Procedure: INSERTION OF MESH;  Surgeon: Jamesetta So, MD;  Location: [facility];  Service: General;  Laterality: N/A;  . INSERTION OF MESH Right 08/28/2013   Procedure: INSERTION OF MESH;  Surgeon: Jamesetta So, MD;  Location: [facility];  Service: General;  Laterality: Right;  . PARACENTESIS  Feb 2014   10.7 liters  . TOOTH EXTRACTION Left 12/12/14  . UMBILICAL HERNIA REPAIR N/A 06/09/2013   Procedure: UMBILICAL HERNIORRHAPHY WITH MESH;  Surgeon: Jamesetta So, MD;  Location: [facility];  Service: General;  Laterality: N/A;     Allergies  Allergen Reactions  . Omeprazole Shortness Of Breath  . Orencia [Abatacept] Anaphylaxis, Hives and Rash  . Other Shortness Of Breath and Rash    Red Meat Causes shortness of breath, rash and flu like symptoms.   . Phenytoin Rash  . Keflex [Cephalexin] Other (See Comments)    Severe yeast infection  .  Phenergan [Promethazine Hcl] Other (See Comments)    Not in right state of mind, HALLUCINATION      Family History  Problem Relation Age of Onset  . Cirrhosis Mother        nash  . COPD Father   . Heart disease Father   . Asthma Unknown   . Heart disease Unknown   . Colon cancer Neg Hx   . Inflammatory bowel disease Neg Hx      Social History Ms. Nickle reports that she quit smoking about 22 years ago. Her smoking use included cigarettes. She has a 24.00 pack-year smoking history. She has never used smokeless tobacco. Ms. Tolleson reports that she does not drink alcohol.   Review of Systems CONSTITUTIONAL: No weight loss, fever, chills, weakness or fatigue.  HEENT: Eyes: No visual loss, blurred vision, double vision or yellow sclerae.No hearing loss, sneezing, congestion, runny nose or sore throat.  SKIN: No rash or itching.  CARDIOVASCULAR: per hpi RESPIRATORY: per hpi GASTROINTESTINAL: No anorexia, nausea, vomiting or diarrhea. No abdominal pain or blood.  GENITOURINARY: No burning on urination, no polyuria NEUROLOGICAL: No headache, dizziness, syncope, paralysis, ataxia, numbness or tingling in the extremities. No change in bowel or bladder control.  MUSCULOSKELETAL: No muscle, back pain, joint pain or stiffness.  LYMPHATICS: No enlarged nodes. No history of splenectomy.  PSYCHIATRIC: No history of depression or anxiety.  ENDOCRINOLOGIC: No reports of sweating, cold or heat intolerance. No polyuria or polydipsia.  Marland Kitchen   Physical Examination Vitals:   11/19/17 1335 11/19/17 1340  BP: 122/64 120/66  Pulse: 81   SpO2: 98%    Vitals:   11/19/17 1335  Weight: 235 lb (106.6 kg)  Height: 5\' 8"  (1.727 m)    Gen: resting comfortably, no acute distress HEENT: no scleral icterus, pupils equal round and reactive, no palptable cervical adenopathy,  CV: RRR, no m/r/g, no jvd Resp: Clear to auscultation bilaterally GI: abdomen is soft, non-tender, non-distended, normal  bowel sounds, no hepatosplenomegaly MSK: extremities are warm, no edema.  Skin: warm, no rash Neuro:  no focal deficits Psych: appropriate affect   Diagnostic Studies     Assessment and Plan  1. Chronic diastolic HF - despite weight gain she has not LE edema, lungs are clear, no JVD. If fluid accumulation may be isolated in her abdomen due to cirrhosis - continue bumex and aldactone as prescribed. If ongoing orthostatic symptoms with recent aldactone decrease may need to lower diuretic further or her bp meds. Orthostatics negative in clinic today.   2. LBBB - intermittent LBBB, noted on 10/25/17 EKG but not on 10/28/17 EKG - she does have CAD risk factors, along with low normal to mildly decreased LVEF - obtain lexiscan to evalaute for underlying CAD  3. Hyperlipidemia - continue statin   Aspririn dosing deferred to neurology   F/u pending test results   Arnoldo Lenis, M.D.

## 2017-11-19 NOTE — Patient Instructions (Signed)
Medication Instructions:  Your physician recommends that you continue on your current medications as directed. Please refer to the Current Medication list given to you today.   Labwork: none  Testing/Procedures: Your physician has requested that you have a lexiscan myoview. For further information please visit [email or web address]. Please follow instruction sheet, as given.    Follow-Up: Your physician recommends that you schedule a follow-up appointment in: pending test results    Any Other Special Instructions Will Be Listed Below (If Applicable).     If you need a refill on your cardiac medications before your next appointment, please call your pharmacy.   

## 2017-11-22 ENCOUNTER — Telehealth: Payer: Self-pay | Admitting: Gastroenterology

## 2017-11-22 ENCOUNTER — Encounter: Payer: Commercial Managed Care - PPO | Admitting: Physical Therapy

## 2017-11-22 NOTE — Telephone Encounter (Signed)
Pt said she saw new cardiologist and she told him she has gained about 10 labs over the last 3 months.  She does not have lower extremity edema and has been doing well since she went back to Spironolactone 50 mg bid. She does feel full in the abdomen, but she is not having any shortness of breath. She wants to know if Dr. Oneida Alar thinks it is ascites. She is aware that Dr. Oneida Alar is off today and I will FYI Roseanne Kaufman, NP since she is doing hospital call today. Pt is aware that Dr. Oneida Alar will be back at the hospital tomorrow.

## 2017-11-22 NOTE — Telephone Encounter (Signed)
Pt called to let DS know that she takes 60ml twice a day of spirolactone.

## 2017-11-22 NOTE — Telephone Encounter (Signed)
LMOM to call.

## 2017-11-22 NOTE — Telephone Encounter (Signed)
Called pt and got disconnected. Could not reach her again will await her call back.

## 2017-11-22 NOTE — Telephone Encounter (Signed)
Pt is aware. Said she just knows that her stomach is noticeably bigger. But in her family, when they gain weight, that is where they put it on.  She doesn't necessarily want to have an [place] at this time if she doesn't have to. She was wondering if Dr. Oneida Alar could just have her come by one day and see what she says. She said no rush and she would not be able to come on Wednesday. Dr. Oneida Alar, please advise!

## 2017-11-22 NOTE — Telephone Encounter (Signed)
PLEASE CALL PATIENT, SHE HAS A QUESTION ABOUT HER WEIGHT GAIN, HER CARDIOLOGIST ASKED IF IT WAS ASCITES.

## 2017-11-22 NOTE — Telephone Encounter (Signed)
This was noted in the previous phone call for today.

## 2017-11-22 NOTE — Telephone Encounter (Signed)
Without physically seeing her, it is difficult to know. Is her abdomen tense?   We could order a limited ultrasound to assess for ascites.   Follow a 2 gram sodium diet. Avoid sodas, added salt, fast food, etc.

## 2017-11-24 NOTE — Telephone Encounter (Signed)
Pt called and said she is concerned about being on the Lipitor since it can affect the liver. She is wondering if that can affect her and cause ascites. She said if Dr. Nona Dell would like for her to come by just to take a quick look at her, that she can always tell whether or not it is her weight or really ascites.

## 2017-11-24 NOTE — Telephone Encounter (Addendum)
Called patient TO DISCUSS CONCERNS. LIPITOR IS OK. HER LIVER ENZYMES ARE NL AND SHOULD BE CHECKED EVERY 6 MOS.SHE WILL STOP BY AUG 29 @12N  TO HAVE HER STOMACH CHECKED.

## 2017-11-25 ENCOUNTER — Telehealth: Payer: Self-pay | Admitting: Gastroenterology

## 2017-11-25 NOTE — Telephone Encounter (Signed)
REVIEWED-NO ADDITIONAL RECOMMENDATIONS. 

## 2017-11-25 NOTE — Telephone Encounter (Signed)
PATIENT CALLED AND STATED SHE WOULD BE HERE AT 12:30 TODAY

## 2017-11-26 ENCOUNTER — Ambulatory Visit (HOSPITAL_COMMUNITY): Payer: Commercial Managed Care - PPO

## 2017-11-26 ENCOUNTER — Encounter (HOSPITAL_COMMUNITY): Payer: Self-pay

## 2017-11-26 ENCOUNTER — Encounter (HOSPITAL_COMMUNITY): Payer: Commercial Managed Care - PPO

## 2017-11-27 ENCOUNTER — Other Ambulatory Visit: Payer: Self-pay | Admitting: Gastroenterology

## 2017-12-03 ENCOUNTER — Encounter (HOSPITAL_COMMUNITY)
Admission: RE | Admit: 2017-12-03 | Discharge: 2017-12-03 | Disposition: A | Discharge status: Home / Self Care | Payer: Commercial Managed Care - PPO | Source: Ambulatory Visit | Attending: Cardiology | Admitting: Cardiology

## 2017-12-03 ENCOUNTER — Encounter (HOSPITAL_COMMUNITY): Payer: Self-pay

## 2017-12-03 ENCOUNTER — Encounter (HOSPITAL_BASED_OUTPATIENT_CLINIC_OR_DEPARTMENT_OTHER)
Admission: RE | Admit: 2017-12-03 | Discharge: 2017-12-03 | Disposition: A | Discharge status: Home / Self Care | Payer: Commercial Managed Care - PPO | Source: Ambulatory Visit | Attending: Cardiology | Admitting: Cardiology

## 2017-12-03 DIAGNOSIS — I447 Left bundle-branch block, unspecified: Secondary | ICD-10-CM

## 2017-12-03 MED ORDER — SODIUM CHLORIDE 0.9% FLUSH
INTRAVENOUS | Status: AC
  Administered 2017-12-03: 10 mL via INTRAVENOUS
  Filled 2017-12-03: qty 10

## 2017-12-03 MED ORDER — TECHNETIUM TC 99M TETROFOSMIN IV KIT
30.0000 | PACK | Freq: Once | INTRAVENOUS | Status: AC | PRN
  Administered 2017-12-03: 27.5 via INTRAVENOUS

## 2017-12-03 MED ORDER — TECHNETIUM TC 99M TETROFOSMIN IV KIT
10.0000 | PACK | Freq: Once | INTRAVENOUS | Status: AC | PRN
  Administered 2017-12-03: 8.5 via INTRAVENOUS

## 2017-12-03 MED ORDER — REGADENOSON 0.4 MG/5ML IV SOLN
INTRAVENOUS | Status: AC
  Administered 2017-12-03: 0.4 mg via INTRAVENOUS
  Filled 2017-12-03: qty 5

## 2017-12-06 ENCOUNTER — Other Ambulatory Visit (HOSPITAL_COMMUNITY)
Admission: RE | Admit: 2017-12-06 | Discharge: 2017-12-06 | Disposition: A | Discharge status: Home / Self Care | Payer: Commercial Managed Care - PPO | Source: Ambulatory Visit | Attending: Obstetrics & Gynecology | Admitting: Obstetrics & Gynecology

## 2017-12-06 ENCOUNTER — Encounter: Payer: Self-pay | Admitting: Obstetrics & Gynecology

## 2017-12-06 ENCOUNTER — Ambulatory Visit (INDEPENDENT_AMBULATORY_CARE_PROVIDER_SITE_OTHER): Payer: Commercial Managed Care - PPO | Admitting: Obstetrics & Gynecology

## 2017-12-06 VITALS — BP 102/67 | HR 76 | Ht 68.0 in | Wt 236.0 lb

## 2017-12-06 DIAGNOSIS — Z1211 Encounter for screening for malignant neoplasm of colon: Secondary | ICD-10-CM

## 2017-12-06 DIAGNOSIS — Z01419 Encounter for gynecological examination (general) (routine) without abnormal findings: Secondary | ICD-10-CM | POA: Diagnosis not present

## 2017-12-06 DIAGNOSIS — Z1212 Encounter for screening for malignant neoplasm of rectum: Secondary | ICD-10-CM

## 2017-12-06 LAB — NM MYOCAR MULTI W/SPECT W/WALL MOTION / EF
CHL CUP NUCLEAR SDS: 11
CHL CUP RESTING HR STRESS: 65 {beats}/min
CSEPPHR: 80 {beats}/min
LV dias vol: 87 mL (ref 46–106)
LVSYSVOL: 51 mL
RATE: 0.29
SRS: 0
SSS: 11
TID: 0.84

## 2017-12-06 NOTE — Progress Notes (Signed)
Subjective:     Deanna Thompson is a 62 y.o. female here for a routine exam.  No LMP recorded. Patient is postmenopausal. 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   Current acute medical issues:  As above   Recent Gynecologic History No LMP recorded. Patient is postmenopausal. Last Pap: 2018,  normal Last mammogram: 03/2016,  normal  Past Medical History:  Diagnosis Date  . Abdominal hernia   . Abscess of pulp of tooth 12/12/14   left side  . Anxiety   . Arthritis    back  and down flank  right side more than left  . B12 deficiency 06/12/2015  . Brain aneurysm    METAL COIL PREVENTS MRI IMAGING and CLIP  . Cirrhosis ([place])    Presumably alcoholic cirrhosis  . Complication of anesthesia   . Depression   . ETOH abuse    Quit in 07/2011   . Fatigue    due to Sjogren's disease  . Folate deficiency 06/30/2016  . GERD (gastroesophageal reflux disease)   . HTN (hypertension)    no longer medicated, dated on 04/25/12  . IBS (irritable bowel syndrome)   . Iron deficiency anemia due to chronic blood loss   . Myofascial pain   . Panic attack   . PONV (postoperative nausea and vomiting)   . Psoriatic arthritis ([place])   . Right inguinal hernia 08/16/2012  . Seizures ([place]) 1997   1 seizure from coil insertion but no more seizures and no meds for seizures  . Sjogren's disease ([place])   . [place] HEAD 02/03/2007   Qualifier: Diagnosis of  By: Aline Brochure MD, Dorothyann Peng    . 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([place])     Past Surgical History:  Procedure Laterality Date  . AGILE CAPSULE N/A 06/21/2014   Procedure: AGILE CAPSULE;  Surgeon: Danie Binder, MD;  [facility];  Service: Endoscopy;  Laterality: N/A;  0700  . BIOPSY  10/13/2011   SLF: mild gastritis/duodenal polypoid lesion   . CEREBRAL ANEURYSM REPAIR  1997  . COLONOSCOPY  10/13/2011   LEX:[id number] hemorrhoids/varices  rectal/lesion in ascending colon(HYPERPLASTIC POLYP)  . COLONOSCOPY N/A 01/17/2013   SLF: 1. Internal hemorrhoids 2. Ascending colon polyps 3.  Rectal varices.   . COLONOSCOPY N/A 08/10/2016   Dr. Oneida Alar: redundant left colon. external and internal hemorhroids. 5-10 year surveillance  . ESOPHAGOGASTRODUODENOSCOPY N/A 06/29/2014   SLF: 1. anemia due to polypoid lesions in teh antrum, and duodenal polyps 2. single duodenum AVM  . FLEXIBLE SIGMOIDOSCOPY N/A 01/21/2016   Procedure: FLEXIBLE SIGMOIDOSCOPY;  Surgeon: Danie Binder, MD;  [facility];  Service: Endoscopy;  Laterality: N/A;  245  . GIVENS CAPSULE STUDY N/A 06/29/2014   Procedure: GIVENS CAPSULE STUDY;  Surgeon: Danie Binder, MD;  [facility];  Service: Endoscopy;  Laterality: N/A;  . GIVENS CAPSULE STUDY N/A 07/13/2014   Procedure: GIVENS CAPSULE STUDY;  Surgeon: Danie Binder, MD;  [facility];  Service: Endoscopy;  Laterality: N/A;  700  . HEMORRHOID BANDING N/A 01/21/2016   Procedure: HEMORRHOID BANDING;  Surgeon: Danie Binder, MD;  [facility];  Service: Endoscopy;  Laterality: N/A;  . INGUINAL HERNIA REPAIR Right 08/28/2013   Procedure: RIGHT NGUINAL HERNIORRHAPHY WITH MESH;  Surgeon: Jamesetta So, MD;  Location: [facility];  Service: General;  Laterality: Right;  . INSERTION OF MESH N/A 06/09/2013   Procedure:  INSERTION OF MESH;  Surgeon: Jamesetta So, MD;  Location: [facility];  Service: General;  Laterality: N/A;  . INSERTION OF MESH Right 08/28/2013   Procedure: INSERTION OF MESH;  Surgeon: Jamesetta So, MD;  Location: [facility];  Service: General;  Laterality: Right;  . PARACENTESIS  Feb 2014   10.7 liters  . TOOTH EXTRACTION Left 12/12/14  . UMBILICAL HERNIA REPAIR N/A 06/09/2013   Procedure: UMBILICAL HERNIORRHAPHY WITH MESH;  Surgeon: Jamesetta So, MD;  Location: [facility];  Service: General;  Laterality: N/A;    OB History    Gravida      Para      Term      Preterm      AB       Living  0     SAB      TAB      Ectopic      Multiple      Live Births              Social History   Socioeconomic History  . Marital status: Married    Spouse name: Not on file  . Number of children: 0  . Years of education: Not on file  . Highest education level: Not on file  Occupational History    Employer: RETIRED  Social Needs  . Financial resource strain: Not on file  . Food insecurity:    Worry: Not on file    Inability: Not on file  . Transportation needs:    Medical: Not on file    Non-medical: Not on file  Tobacco Use  . Smoking status: Former Smoker    Packs/day: 2.00    Years: 12.00    Pack years: 24.00    Types: Cigarettes    Last attempt to quit: 03/31/1995    Years since quitting: 22.7  . Smokeless tobacco: Never Used  . Tobacco comment: quit in 1997  Substance and Sexual Activity  . Alcohol use: No    Comment: Bottle of wine a day for years, but quit in 07/2011  . Drug use: No  . Sexual activity: Not Currently    Birth control/protection: Post-menopausal  Lifestyle  . Physical activity:    Days per week: Not on file    Minutes per session: Not on file  . Stress: Not on file  Relationships  . Social connections:    Talks on phone: Not on file    Gets together: Not on file    Attends religious service: Not on file    Active member of club or organization: Not on file    Attends meetings of clubs or organizations: Not on file    Relationship status: Not on file  Other Topics Concern  . Not on file  Social History Narrative   Lives   Caffeine use:    Quit drinking alcohol 3 YEARS ago. No drugs, no IVDU.     Family History  Problem Relation Age of Onset  . Cirrhosis Mother        nash  . COPD Father   . Heart disease Father   . Asthma Unknown   . Heart disease Unknown   . Colon cancer Neg Hx   . Inflammatory bowel disease Neg Hx      Current Outpatient Medications:  .  aspirin 325 MG tablet, Take 325 mg by mouth  daily., Disp: , Rfl:  .  atorvastatin (LIPITOR) 40 MG tablet, TAKE (1) TABLET BY MOUTH  ONCE DAILY., Disp: 30 tablet, Rfl: 5 .  bumetanide (BUMEX) 1 MG tablet, TAKE (1) TABLET BY MOUTH (2) TIMES DAILY. (Patient taking differently: Take 1 mg by mouth 2 (two) times daily. ), Disp: 180 tablet, Rfl: 3 .  bumetanide (BUMEX) 1 MG tablet, TAKE (1) TABLET BY MOUTH TWICE DAILY., Disp: 180 tablet, Rfl: 3 .  buPROPion (WELLBUTRIN SR) 100 MG 12 hr tablet, Take 1 tablet (100 mg total) by mouth 2 (two) times daily., Disp: 60 tablet, Rfl: 5 .  docusate sodium (COLACE) 100 MG capsule, Take 100 mg by mouth daily. , Disp: , Rfl:  .  escitalopram (LEXAPRO) 20 MG tablet, TAKE (1) TABLET BY MOUTH AT BEDTIME., Disp: 30 tablet, Rfl: 5 .  fluticasone (FLONASE) 50 MCG/ACT nasal spray, Place 1 spray into both nostrils See admin instructions. Instill 1 spray into each nostril every morning, may also use later in the day as needed for sinus congestion, Disp: , Rfl:  .  hydrocortisone cream 1 %, Apply 1 application topically 2 (two) times daily as needed (eczema in ears)., Disp: , Rfl:  .  Lifitegrast (XIIDRA) 5 % SOLN, Place 1 drop into both eyes 2 (two) times daily., Disp: , Rfl:  .  linaclotide (LINZESS) 145 MCG CAPS capsule, Take 1 capsule (145 mcg total) by mouth daily before breakfast., Disp: 30 capsule, Rfl: 11 .  loratadine (CLARITIN) 10 MG tablet, Take 10 mg by mouth daily., Disp: , Rfl:  .  losartan (COZAAR) 25 MG tablet, TAKE (1) TABLET BY MOUTH ONCE DAILY., Disp: 30 tablet, Rfl: 5 .  meclizine (ANTIVERT) 25 MG tablet, Take 1 tablet (25 mg total) by mouth 3 (three) times daily as needed for dizziness., Disp: 20 tablet, Rfl: 0 .  Multiple Vitamin (MULTIVITAMIN WITH MINERALS) TABS tablet, Take 1 tablet by mouth daily., Disp: , Rfl:  .  ondansetron (ZOFRAN) 4 MG tablet, Take 1 tablet (4 mg total) by mouth every 8 (eight) hours as needed for nausea or vomiting., Disp: 12 tablet, Rfl: 0 .  Polyvinyl Alcohol-Povidone  (REFRESH OP), Place 1 drop into both eyes 3 (three) times daily as needed (dry eyes)., Disp: , Rfl:  .  ranitidine (ZANTAC) 150 MG tablet, TAKE (1) TABLET BY MOUTH TWICE DAILY., Disp: 180 tablet, Rfl: 5 .  Simethicone (GAS-X PO), Take 1 tablet by mouth 2 (two) times daily as needed (bloating/flatulence)., Disp: , Rfl:  .  sodium chloride (OCEAN) 0.65 % nasal spray, Place 1 spray into the nose 2 (two) times daily as needed for congestion. , Disp: , Rfl:  .  spironolactone (ALDACTONE) 100 MG tablet, TAKE (1) TABLET BY MOUTH TWICE DAILY. (Patient taking differently: Take 50 mg by mouth 2 (two) times daily. TAKE (1) TABLET BY MOUTH TWICE DAILY.), Disp: 180 tablet, Rfl: 5 .  predniSONE (DELTASONE) 20 MG tablet, Take 60 mg daily x 2 days then 40 mg daily x 2 days then 20 mg daily x 2 days (Patient not taking: Reported on 12/06/2017), Disp: 12 tablet, Rfl: 0  Review of Systems  Review of Systems  Constitutional: Negative for fever, chills, weight loss, malaise/fatigue and diaphoresis.  HENT: Negative for hearing loss, ear pain, nosebleeds, congestion, sore throat, neck pain, tinnitus and ear discharge.   Eyes: Negative for blurred vision, double vision, photophobia, pain, discharge and redness.  Respiratory: Negative for cough, hemoptysis, sputum production, shortness of breath, wheezing and stridor.   Cardiovascular: Negative for chest pain, palpitations, orthopnea, claudication, leg swelling and PND.  Gastrointestinal: negative for abdominal  pain. Negative for heartburn, nausea, vomiting, diarrhea, constipation, blood in stool and melena.  Genitourinary: Negative for dysuria, urgency, frequency, hematuria and flank pain.  Musculoskeletal: Negative for myalgias, back pain, joint pain and falls.  Skin: Negative for itching and rash.  Neurological: Negative for dizziness, tingling, tremors, sensory change, speech change, focal weakness, seizures, loss of consciousness, weakness and headaches.   Endo/Heme/Allergies: Negative for environmental allergies and polydipsia. Does not bruise/bleed easily.  Psychiatric/Behavioral: Negative for depression, suicidal ideas, hallucinations, memory loss and substance abuse. The patient is not nervous/anxious and does not have insomnia.        Objective:  Blood pressure 102/67, pulse 76, height 5\' 8"  (1.727 m), weight 236 lb (107 kg).   Physical Exam  Vitals reviewed. Constitutional: She is oriented to person, place, and time. She appears well-developed and well-nourished.  HENT:  Head: Normocephalic and atraumatic.        Right Ear: External ear normal.  Left Ear: External ear normal.  Nose: Nose normal.  Mouth/Throat: Oropharynx is clear and moist.  Eyes: Conjunctivae and EOM are normal. Pupils are equal, round, and reactive to light. Right eye exhibits no discharge. Left eye exhibits no discharge. No scleral icterus.  Neck: Normal range of motion. Neck supple. No tracheal deviation present. No thyromegaly present.  Cardiovascular: Normal rate, regular rhythm, normal heart sounds and intact distal pulses.  Exam reveals no gallop and no friction rub.   No murmur heard. Respiratory: Effort normal and breath sounds normal. No respiratory distress. She has no wheezes. She has no rales. She exhibits no tenderness.  GI: Soft. Bowel sounds are normal. She exhibits no distension and no mass. There is no tenderness. There is no rebound and no guarding.  Genitourinary:  Breasts no masses skin changes or nipple changes bilaterally      Vulva is normal without lesions Vagina is pink moist without discharge Cervix normal in appearance and pap is done Uterus is normal size shape and contour Adnexa is negative with normal sized ovaries  {Rectal    hemoccult negative, normal tone, no masses  Musculoskeletal: Normal range of motion. She exhibits no edema and no tenderness.  Neurological: She is alert and oriented to person, place, and time. She has  normal reflexes. She displays normal reflexes. No cranial nerve deficit. She exhibits normal muscle tone. Coordination normal.  Skin: Skin is warm and dry. No rash noted. No erythema. No pallor.  Psychiatric: She has a normal mood and affect. Her behavior is normal. Judgment and thought content normal.       Medications Ordered at today's visit: No orders of the defined types were placed in this encounter.   Other orders placed at today's visit: No orders of the defined types were placed in this encounter.     Assessment:    Healthy female exam.    Plan:    Contraception: post menopausal status. Mammogram ordered. Follow up in: 2 years.     Return in about 2 years (around 12/07/2019) for yearly, with Dr Elonda Husky.

## 2017-12-07 ENCOUNTER — Telehealth: Payer: Self-pay

## 2017-12-07 LAB — CYTOLOGY - PAP
Diagnosis: NEGATIVE
HPV: NOT DETECTED

## 2017-12-07 NOTE — Telephone Encounter (Signed)
-----   Message from Arnoldo Lenis, MD sent at 12/07/2017 11:34 AM EDT ----- Stress test results are limited. There was a lot of uptake of the radiotracer in the gut which sometimes happens, and limits the visibility of the test. Overall the test is limited in its ability to determine blockages. We could consider an alternative test at some point if she were to have any cardiac symptoms, but at this time I would just plan on seeing her back in 6 months   Zandra Abts MD

## 2017-12-07 NOTE — Telephone Encounter (Signed)
Called pt. No answer. Left message for pt to return call.  

## 2017-12-09 ENCOUNTER — Telehealth: Payer: Self-pay

## 2017-12-09 NOTE — Telephone Encounter (Signed)
Called pt. No answer. Left message for pt call back.

## 2017-12-09 NOTE — Telephone Encounter (Signed)
-----   Message from Arnoldo Lenis, MD sent at 12/07/2017 11:34 AM EDT ----- Stress test results are limited. There was a lot of uptake of the radiotracer in the gut which sometimes happens, and limits the visibility of the test. Overall the test is limited in its ability to determine blockages. We could consider an alternative test at some point if she were to have any cardiac symptoms, but at this time I would just plan on seeing her back in 6 months   Zandra Abts MD

## 2017-12-16 ENCOUNTER — Telehealth: Payer: Self-pay

## 2017-12-16 NOTE — Telephone Encounter (Signed)
-----   Message from Arnoldo Lenis, MD sent at 12/07/2017 11:34 AM EDT ----- Stress test results are limited. There was a lot of uptake of the radiotracer in the gut which sometimes happens, and limits the visibility of the test. Overall the test is limited in its ability to determine blockages. We could consider an alternative test at some point if she were to have any cardiac symptoms, but at this time I would just plan on seeing her back in 6 months   Zandra Abts MD

## 2017-12-16 NOTE — Telephone Encounter (Signed)
Called pt's cell phone. Have tried house as well. No answer. No return calls as of yet. Left another message for pt to return call.

## 2017-12-22 ENCOUNTER — Other Ambulatory Visit: Payer: Self-pay

## 2017-12-22 ENCOUNTER — Emergency Department (HOSPITAL_COMMUNITY)
Admission: EM | Admit: 2017-12-22 | Discharge: 2017-12-22 | Discharge status: Against Medical Advice | Payer: Commercial Managed Care - PPO | Attending: Emergency Medicine | Admitting: Emergency Medicine

## 2017-12-22 ENCOUNTER — Encounter (HOSPITAL_COMMUNITY): Payer: Self-pay | Admitting: Emergency Medicine

## 2017-12-22 DIAGNOSIS — T7840XA Allergy, unspecified, initial encounter: Secondary | ICD-10-CM | POA: Insufficient documentation

## 2017-12-22 DIAGNOSIS — Z5321 Procedure and treatment not carried out due to patient leaving prior to being seen by health care provider: Secondary | ICD-10-CM | POA: Diagnosis not present

## 2017-12-22 NOTE — ED Triage Notes (Addendum)
Pt reports generalized itching, shortness of breath, "feel as if wheezing". Pt reports has allergy to red meat. Pt reports symptoms began after eating ham. Airway patent.

## 2018-01-03 ENCOUNTER — Ambulatory Visit (INDEPENDENT_AMBULATORY_CARE_PROVIDER_SITE_OTHER): Payer: Commercial Managed Care - PPO | Admitting: Neurology

## 2018-01-03 ENCOUNTER — Other Ambulatory Visit: Payer: Self-pay | Admitting: *Deleted

## 2018-01-03 ENCOUNTER — Encounter: Payer: Self-pay | Admitting: Neurology

## 2018-01-03 ENCOUNTER — Ambulatory Visit: Payer: Commercial Managed Care - PPO | Admitting: Adult Health

## 2018-01-03 ENCOUNTER — Encounter: Payer: Self-pay | Admitting: *Deleted

## 2018-01-03 VITALS — BP 104/70 | HR 71 | Ht 68.0 in | Wt 236.0 lb

## 2018-01-03 DIAGNOSIS — K7460 Unspecified cirrhosis of liver: Secondary | ICD-10-CM

## 2018-01-03 DIAGNOSIS — R42 Dizziness and giddiness: Secondary | ICD-10-CM

## 2018-01-03 NOTE — Patient Instructions (Signed)
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  I recommend she continue to do vestibular stabilization exercises on a regular basis.  No specific medication changes or tests necessary at the present time.  She was advised to continue aspirin for stroke prevention and maintain strict control of hypertension with blood pressure goal below 130/90 and lipids with LDL cholesterol goal below 70 mg percent.  She will return for follow-up in 6 months with my nurse practitioner Janett Billow or call earlier if necessary.

## 2018-01-03 NOTE — Progress Notes (Signed)
[facility] [address] [place]. Deanna Thompson [phone]       OFFICE FOLLOW UP NOTE  [name] Date of Birth:  05/10/1955 Medical Record Number:  [id number]   Reason for Referral:  hospital follow up  CHIEF COMPLAINT:  Chief Complaint  Patient presents with  . Follow-up    Follow up from [facility] NP to see Dr. 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 History obtained from patient and chart review. Reviewed all radiology images and labs personally.   HPI: 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 Patient does have a history of BPPV, which was short lasting. Patient came to ED with gradual onset of dizziness and then vertigo, nausea/vomiting, diaphoresis as well as binocular diplopia, lasting more than 4 hours and then gradually getting better. Upon admission, her examination benign. CT head reviewed and negative for acute abnormality. CTA head neck showed known left MCA 3 mm aneurysm at bifurcation and left nondominant VA V2 stenosis.  2D echo showed EF of 45-50% (recommended outpatient cardiology follow-up).  A1c 5.8 and LDL 129.  Repeat CT scan was performed 24 hours later and again showed no acute intracranial abnormality.  MRI unable to be obtained due to aneurysm clip.   As patient was not on antithrombotic prior to admission, recommended to start aspirin 81 mg daily.  As LDL was elevated, it was recommended to start Lipitor 40 mg  daily.  Patient does have a history of a 3 mm aneurysm is being followed by Dr. Estanislado Thompson.  History of alcoholic cirrhosis and recommended to be carefully monitored by GI with initiation of Lipitor and aspirin.  During admission, LFTs currently within normal limits.  With symptoms of vertigo, diplopia, and ataxia it was believed that these symptoms were more related to a small brainstem central event as BPPV is not typically associated with diplopia or ataxia lasting for extended amount of time, and vestibular neuritis does not have diplopia but normally lasting longer than this. It was unclear if this event was related to her basilar artery pathology on top of previous coiling and clipping but as a DSA with Dr. Estanislado Thompson was an option, it would not necessarily change management at this time and as her clinical symptoms improved it was decided to hold off on cerebral angiogram at this time. Patient was discharged home in stable condition.   Patient is being seen today for hospital follow-up.  She has been continuing PT for vestibular rehab and has been seeing improvement in her dizziness.  Occasionally she will have increased dizziness with increased visual problems but typically sees this greater when she has increased fatigue.  She states up until yesterday she was about 60% improved and then felt as though she took a slight decrease in improvement.  She does go to our neuro rehab clinic 2 times per week.  Patient states that she feels fatigued frequently but is one that would rather try to push through her fatigue then rest at that time  and does see worsening in symptoms.  She continues to take aspirin with mild bruising but no bleeding.  Continues to take Lipitor without side effects myalgias.  Blood pressure today satisfactory 119/69.  Patient will be going for liver ultrasound next week and does have liver enzyme testing done every 3 to 6 months.  Patient will also be following up with cardiology on 09/16/2017  for repeat echocardiogram for low EF.  Interval History:  Since previous visit, patient was seen at [facility] ED on 10/28/2017 due to complaints of vision changes.  She was unable to receive MRI due to coiling cerebral aneurysm and CT was negative for acute infarct.  She was diagnosed with possible TIA and recommended increase of aspirin from 81 mg to 325 mg.  Patient was discharged home in stable condition.   Patient was seen today for sooner appointment due to recent hospitalization for possible TIA.  Patient states since this hospitalization, she has had increase and "cluster symptoms".  She has been experiencing these since her stroke in 07/2017 but they have been increased since 10/28/2017.  She experiences these cluster of symptoms after scanning an area with her head/eyes from side to side and will feel a sensation of nausea, "puffy head" and feeling of being off balance.  She did have these on occasion previously but since 8/1 they have been happening daily and will last throughout the day.  She denies headaches or migraine history but does state she has chronic issues with her sinuses and these issues have been worse over the past 7 to 10 days.  She states she will have left eye pain that feels puffy, pain in her jaw and in the back of her neck.  She feels as though this is all related to her sinuses due to sinus stuffiness and postnasal drip sensation.  She denies her "cluster symptoms" being associated with headache/migraine or vision changes.  Denies increased anxiety prior to the symptoms occurring.  She does state that she is been doing home exercises that were given to her after completing PT for BPPV but she will be restarting outpatient PT on 11/08/2017 to see if this will help with her increase in symptoms.  She also states that since her stroke in 07/2017 she will occasionally feel a "blip" in her vision but only lasts for a few seconds and then resolves.  She is unable to describe her exact  symptoms but states it feels like a catching sensation but denies blurry vision or double vision.  She was seen in the ED after this occurring the night prior to due to waking up in the morning with slightly worse vision than the day prior.  Patient is concerned due to not knowing reasoning behind these continued symptoms and is frustrated that she is unable to control these symptoms.  During appointment, patient is very spastic with rapid speech and heightened anxiety.  It was recommended to increase aspirin 81 mg to 325 mg but recommended for clearance from GI (due to history of cirrhosis) and neurology prior to doing so.  GI did clear for this increase and agree with this plan.  No further testing was done during recent hospitalization besides CT scan which was negative for acute infarct. Update 01/03/2018.  She returns for follow-up after last visit with Deanna Thompson 2 months ago.  She states she is actually doing better and has not had any further episodes of dizziness or room spinning.  She did benefit significantly by  her spitting and vestibular rehabilitation and doing dizziness exercises.  She has had previous episodes of benign positional vertigo in the past and total of 4-5 such episodes so far.  She is denies any significant anxiety.  She has a history of a small 3 mm left MCA bifurcation aneurysm which is being followed conservatively by Dr. Estanislado Thompson.  Her last CT angiogram was on 07/31/2017 which showed treated basilar apex aneurysm without any remnant of recurrence.  3 mm untreated left MCA bifurcation aneurysm which is unchanged from previous study.  She had been told at the last office visit by the nurse Thompson that her symptoms may have been related to underlying anxiety and she did not like this answer and wanted to see me for a second opinion.  ROS:   14 system review of systems performed and negative with exception of dizziness, fatigue, activity change, itching and  redness, constipation, excessive thirst, daytime sleepiness, food allergies, joint pain, walking difficulty, memory loss, headache, speech difficulty and depression PMH:  Past Medical History:  Diagnosis Date  . Abdominal hernia   . Abscess of pulp of tooth 12/12/14   left side  . Anxiety   . Arthritis    back  and down flank  right side more than left  . B12 deficiency 06/12/2015  . Brain aneurysm    METAL COIL PREVENTS MRI IMAGING and CLIP  . Cirrhosis ([place])    Presumably alcoholic cirrhosis  . Complication of anesthesia   . Depression   . ETOH abuse    Quit in 07/2011   . Fatigue    due to Sjogren's disease  . Folate deficiency 06/30/2016  . GERD (gastroesophageal reflux disease)   . HTN (hypertension)    no longer medicated, dated on 04/25/12  . IBS (irritable bowel syndrome)   . Iron deficiency anemia due to chronic blood loss   . Myofascial pain   . Panic attack   . PONV (postoperative nausea and vomiting)   . Psoriatic arthritis ([place])   . Right inguinal hernia 08/16/2012  . Seizures ([place]) 1997   1 seizure from coil insertion but no more seizures and no meds for seizures  . Sjogren's disease ([place])   . [place] HEAD 02/03/2007   Qualifier: Diagnosis of  By: Aline Brochure MD, Dorothyann Peng    . 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(HCC)     PSH:  Past Surgical History:  Procedure Laterality Date  . AGILE CAPSULE N/A 06/21/2014   Procedure: AGILE CAPSULE;  Surgeon: Danie Binder, MD;  [facility];  Service: Endoscopy;  Laterality: N/A;  0700  . BIOPSY  10/13/2011   SLF: mild gastritis/duodenal polypoid lesion   . CEREBRAL ANEURYSM REPAIR  1997  . COLONOSCOPY  10/13/2011   NGE:[id number] hemorrhoids/varices rectal/lesion in ascending colon(HYPERPLASTIC POLYP)  . COLONOSCOPY N/A 01/17/2013   SLF: 1. Internal hemorrhoids 2. Ascending colon polyps 3.  Rectal varices.   . COLONOSCOPY N/A 08/10/2016   Dr. Oneida Alar: redundant left colon. external and internal  hemorhroids. 5-10 year surveillance  . ESOPHAGOGASTRODUODENOSCOPY N/A 06/29/2014   SLF: 1. anemia due to polypoid lesions in teh antrum, and duodenal polyps 2. single duodenum AVM  . FLEXIBLE SIGMOIDOSCOPY N/A 01/21/2016   Procedure: FLEXIBLE SIGMOIDOSCOPY;  Surgeon: Danie Binder, MD;  [facility];  Service: Endoscopy;  Laterality: N/A;  245  . GIVENS CAPSULE STUDY N/A 06/29/2014   Procedure: GIVENS CAPSULE STUDY;  Surgeon: Danie Binder, MD;  [facility];  Service:  Endoscopy;  Laterality: N/A;  . GIVENS CAPSULE STUDY N/A 07/13/2014   Procedure: GIVENS CAPSULE STUDY;  Surgeon: Danie Binder, MD;  [facility];  Service: Endoscopy;  Laterality: N/A;  700  . HEMORRHOID BANDING N/A 01/21/2016   Procedure: HEMORRHOID BANDING;  Surgeon: Danie Binder, MD;  [facility];  Service: Endoscopy;  Laterality: N/A;  . INGUINAL HERNIA REPAIR Right 08/28/2013   Procedure: RIGHT NGUINAL HERNIORRHAPHY WITH MESH;  Surgeon: Jamesetta So, MD;  Location: [facility];  Service: General;  Laterality: Right;  . INSERTION OF MESH N/A 06/09/2013   Procedure: INSERTION OF MESH;  Surgeon: Jamesetta So, MD;  Location: [facility];  Service: General;  Laterality: N/A;  . INSERTION OF MESH Right 08/28/2013   Procedure: INSERTION OF MESH;  Surgeon: Jamesetta So, MD;  Location: [facility];  Service: General;  Laterality: Right;  . PARACENTESIS  Feb 2014   10.7 liters  . TOOTH EXTRACTION Left 12/12/14  . UMBILICAL HERNIA REPAIR N/A 06/09/2013   Procedure: UMBILICAL HERNIORRHAPHY WITH MESH;  Surgeon: Jamesetta So, MD;  Location: [facility];  Service: General;  Laterality: N/A;    Social History:  Social History   Socioeconomic History  . Marital status: Married    Spouse name: Not on file  . Number of children: 0  . Years of education: Not on file  . Highest education level: Not on file  Occupational History    Employer: RETIRED  Social Needs  . Financial resource strain: Not on file    . Food insecurity:    Worry: Not on file    Inability: Not on file  . Transportation needs:    Medical: Not on file    Non-medical: Not on file  Tobacco Use  . Smoking status: Former Smoker    Packs/day: 2.00    Years: 12.00    Pack years: 24.00    Types: Cigarettes    Last attempt to quit: 03/31/1995    Years since quitting: 22.7  . Smokeless tobacco: Never Used  . Tobacco comment: quit in 1997  Substance and Sexual Activity  . Alcohol use: No    Comment: Bottle of wine a day for years, but quit in 07/2011  . Drug use: No  . Sexual activity: Not Currently    Birth control/protection: Post-menopausal  Lifestyle  . Physical activity:    Days per week: Not on file    Minutes per session: Not on file  . Stress: Not on file  Relationships  . Social connections:    Talks on phone: Not on file    Gets together: Not on file    Attends religious service: Not on file    Active member of club or organization: Not on file    Attends meetings of clubs or organizations: Not on file    Relationship status: Not on file  . Intimate partner violence:    Fear of current or ex partner: Not on file    Emotionally abused: Not on file    Physically abused: Not on file    Forced sexual activity: Not on file  Other Topics Concern  . Not on file  Social History Narrative   Lives   Caffeine use:    Quit drinking alcohol 3 YEARS ago. No drugs, no IVDU.     Family History:  Family History  Problem Relation Age of Onset  . Cirrhosis Mother        nash  .  COPD Father   . Heart disease Father   . Asthma Unknown   . Heart disease Unknown   . Colon cancer Neg Hx   . Inflammatory bowel disease Neg Hx     Medications:   Current Outpatient Medications on File Prior to Visit  Medication Sig Dispense Refill  . aspirin 325 MG tablet Take 325 mg by mouth daily.    Marland Kitchen atorvastatin (LIPITOR) 40 MG tablet TAKE (1) TABLET BY MOUTH ONCE DAILY. 30 tablet 5  . bumetanide (BUMEX) 1 MG tablet TAKE (1)  TABLET BY MOUTH (2) TIMES DAILY. (Patient taking differently: Take 1 mg by mouth 2 (two) times daily. ) 180 tablet 3  . buPROPion (WELLBUTRIN SR) 100 MG 12 hr tablet Take 1 tablet (100 mg total) by mouth 2 (two) times daily. 60 tablet 5  . docusate sodium (COLACE) 100 MG capsule Take 100 mg by mouth daily.     Marland Kitchen escitalopram (LEXAPRO) 20 MG tablet TAKE (1) TABLET BY MOUTH AT BEDTIME. 30 tablet 5  . fluticasone (FLONASE) 50 MCG/ACT nasal spray Place 1 spray into both nostrils See admin instructions. Instill 1 spray into each nostril every morning, may also use later in the day as needed for sinus congestion    . hydrocortisone cream 1 % Apply 1 application topically 2 (two) times daily as needed (eczema in ears).    Marland Kitchen Lifitegrast (XIIDRA) 5 % SOLN Place 1 drop into both eyes 2 (two) times daily.    Marland Kitchen linaclotide (LINZESS) 145 MCG CAPS capsule Take 1 capsule (145 mcg total) by mouth daily before breakfast. 30 capsule 11  . loratadine (CLARITIN) 10 MG tablet Take 10 mg by mouth daily.    Marland Kitchen losartan (COZAAR) 25 MG tablet TAKE (1) TABLET BY MOUTH ONCE DAILY. 30 tablet 5  . meclizine (ANTIVERT) 25 MG tablet Take 1 tablet (25 mg total) by mouth 3 (three) times daily as needed for dizziness. 20 tablet 0  . Multiple Vitamin (MULTIVITAMIN WITH MINERALS) TABS tablet Take 1 tablet by mouth daily.    . ondansetron (ZOFRAN) 4 MG tablet Take 1 tablet (4 mg total) by mouth every 8 (eight) hours as needed for nausea or vomiting. 12 tablet 0  . Polyvinyl Alcohol-Povidone (REFRESH OP) Place 1 drop into both eyes 3 (three) times daily as needed (dry eyes).    . ranitidine (ZANTAC) 150 MG tablet TAKE (1) TABLET BY MOUTH TWICE DAILY. 180 tablet 5  . Simethicone (GAS-X PO) Take 1 tablet by mouth 2 (two) times daily as needed (bloating/flatulence).    . sodium chloride (OCEAN) 0.65 % nasal spray Place 1 spray into the nose 2 (two) times daily as needed for congestion.     Marland Kitchen spironolactone (ALDACTONE) 100 MG tablet TAKE (1)  TABLET BY MOUTH TWICE DAILY. (Patient taking differently: Take 50 mg by mouth 2 (two) times daily. TAKE (1) TABLET BY MOUTH TWICE DAILY.) 180 tablet 5   No current facility-administered medications on file prior to visit.     Allergies:   Allergies  Allergen Reactions  . Omeprazole Shortness Of Breath  . Orencia [Abatacept] Anaphylaxis, Hives and Rash  . Other Shortness Of Breath and Rash    Red Meat Causes shortness of breath, rash and flu like symptoms.   . Phenytoin Rash  . Keflex [Cephalexin] Other (See Comments)    Severe yeast infection  . Phenergan [Promethazine Hcl] Other (See Comments)    Not in right state of mind, HALLUCINATION     Physical  Exam  Vitals:   01/03/18 1035  BP: 104/70  Pulse: 71  Weight: 236 lb (107 kg)  Height: 5\' 8"  (1.727 m)   Body mass index is 35.88 kg/m. No exam data present  General: well developed, pleasant middle-aged Caucasian female, well nourished, seated, in no evident distress Head: head normocephalic and atraumatic.   Neck: supple with no carotid or supraclavicular bruits Cardiovascular: regular rate and rhythm, no murmurs Musculoskeletal: no deformity Skin:  no rash/petichiae Vascular:  Normal pulses all extremities  Neurologic Exam Mental Status: Awake and fully alert.  Chronic mild expressive aphasia intermittently.  Oriented to place and time. Recent and remote memory intact.  Attention span, concentration and fund of knowledge appropriate. Mood and affect appropriate.  Cranial Nerves: Fundoscopic exam reveals sharp disc margins. Pupils equal, briskly reactive to light. Extraocular movements full without nystagmus. Visual fields full to confrontation. Hearing intact. Facial sensation intact. Face, tongue, palate moves normally and symmetrically.  Motor: Normal bulk and tone. Normal strength in all tested extremity muscles. Sensory.: intact to touch , pinprick , position and vibratory sensation.  Coordination: Rapid alternating  movements normal in all extremities. Finger-to-nose and heel-to-shin performed accurately bilaterally.  Head shaking does not produce objective dizziness or nystagmus.  Fukuda stepping test is positive with patient moving several steps of the base and rotating to the right.  Hallpike maneuver was not done Gait and Station: Arises from chair without difficulty. Stance is normal. Gait demonstrates normal stride length and balance .  Reflexes: 1+ and symmetric. Toes downgoing.          ASSESSMENT: Savilla Turbyfill is a 62 y.o. year old female here with possible right brainstem infarct on 07/31/2017. Vascular risk factors include HTN and HLD.  Patient returns today with possible TIA on 10/28/2017 after worsening in vision.     Recurrent episodes of transient positional vertigo likely benign paroxysmal positional vertigo which appears to have improved.   PLAN: -I had a long discussion with the patient regarding her symptoms of intermittent positional dizziness likely representing benign paroxysmal positional vertigo.  I recommend she continue to do vestibular stabilization exercises on a regular basis.  No specific medication changes or tests necessary at the present time.  She was advised to continue aspirin for stroke prevention and maintain strict control of hypertension with blood pressure goal below 130/90 and lipids with LDL cholesterol goal below 70 mg percent.  She will return for follow-up in 6 months with my nurse Thompson Deanna Thompson or call earlier if necessary.  Greater than 50% of time during this 25 minute visit was spent on counseling,explanation of diagnosis of possible brainstem infarct,  BPPV,reviewing risk factor management of HLD and HTN, planning of further management, discussion with patient and family and coordination of care  Antony Contras, MD  [facility] [address] [place],  [address]  Phone [phone] Fax [phone]

## 2018-01-26 ENCOUNTER — Ambulatory Visit (INDEPENDENT_AMBULATORY_CARE_PROVIDER_SITE_OTHER): Payer: Commercial Managed Care - PPO | Admitting: Gastroenterology

## 2018-01-26 ENCOUNTER — Other Ambulatory Visit (HOSPITAL_COMMUNITY)
Admission: RE | Admit: 2018-01-26 | Discharge: 2018-01-26 | Disposition: A | Discharge status: Home / Self Care | Payer: Commercial Managed Care - PPO | Source: Ambulatory Visit | Attending: Gastroenterology | Admitting: Gastroenterology

## 2018-01-26 DIAGNOSIS — K7030 Alcoholic cirrhosis of liver without ascites: Secondary | ICD-10-CM

## 2018-01-26 DIAGNOSIS — K219 Gastro-esophageal reflux disease without esophagitis: Secondary | ICD-10-CM

## 2018-01-26 DIAGNOSIS — K7460 Unspecified cirrhosis of liver: Secondary | ICD-10-CM | POA: Diagnosis not present

## 2018-01-26 DIAGNOSIS — K5901 Slow transit constipation: Secondary | ICD-10-CM

## 2018-01-26 LAB — HEPATIC FUNCTION PANEL
ALBUMIN: 3.9 g/dL (ref 3.5–5.0)
ALK PHOS: 72 U/L (ref 38–126)
ALT: 16 U/L (ref 0–44)
AST: 20 U/L (ref 15–41)
BILIRUBIN TOTAL: 0.4 mg/dL (ref 0.3–1.2)
Bilirubin, Direct: 0.1 mg/dL (ref 0.0–0.2)
Indirect Bilirubin: 0.3 mg/dL (ref 0.3–0.9)
Total Protein: 6.9 g/dL (ref 6.5–8.1)

## 2018-01-26 MED ORDER — FAMOTIDINE 20 MG PO TABS: ORAL_TABLET | ORAL | 11 refills | Status: DC

## 2018-01-26 NOTE — Assessment & Plan Note (Signed)
SYMPTOMS FAIRLY WELL CONTROLLED.  DRINK WATER TO KEEP YOUR URINE LIGHT YELLOW. TO TREAT REFLUX AND PREVENT STOMACH ULCERS AND GASTRITIS DUE TO DAILY ASPIRIN USE, USE Pepcid twice a day MON-FRI. FOLLOW UP IN 6 MOS.

## 2018-01-26 NOTE — Progress Notes (Signed)
CC'ED TO PCP 

## 2018-01-26 NOTE — Assessment & Plan Note (Signed)
SYMPTOMS NOT CONTROLLED.  DRINK WATER TO KEEP YOUR URINE LIGHT YELLOW. FOLLOW A HIGH FIBER DIET. AVOID ITEMS THAT CAUSE BLOATING & GAS. SEE INFO BELOW. CONTINUE LINZESS AND COLACE. FOLLOW UP IN 6 MOS.

## 2018-01-26 NOTE — Assessment & Plan Note (Signed)
WELL COMPENSATED DISEASE.  CONTINUE LASIX/ALDACTONE. U/S Q6 MOS. FOLLOW UP IN 6 MOS.

## 2018-01-26 NOTE — Patient Instructions (Addendum)
DRINK WATER TO KEEP YOUR URINE LIGHT YELLOW.  FOLLOW A HIGH FIBER DIET. AVOID ITEMS THAT CAUSE BLOATING & GAS. SEE INFO BELOW.   HOLD LIPITOR FOR ONE MONTH.  TO TREAT REFLUX AND PREVENT STOMACH ULCERS AND GASTRITIS DUE TO DAILY ASPIRIN USE, USE Pepcid twice a day MON-FRI.  CONTINUE LINZESS.   FOLLOW UP IN 6 MOS.   High-Fiber Diet A high-fiber diet changes your normal diet to include more whole grains, legumes, fruits, and vegetables. Changes in the diet involve replacing refined carbohydrates with unrefined foods. The calorie level of the diet is essentially unchanged. The Dietary Reference Intake (recommended amount) for adult males is 38 grams per day. For adult females, it is 25 grams per day. Pregnant and lactating women should consume 28 grams of fiber per day. Fiber is the intact part of a plant that is not broken down during digestion. Functional fiber is fiber that has been isolated from the plant to provide a beneficial effect in the body.  PURPOSE  Increase stool bulk.   Ease and regulate bowel movements.   Lower cholesterol.   REDUCE RISK OF COLON CANCER  INDICATIONS THAT YOU NEED MORE FIBER  Constipation and hemorrhoids.   Uncomplicated diverticulosis (intestine condition) and irritable bowel syndrome.   Weight management.   As a protective measure against hardening of the arteries (atherosclerosis), diabetes, and cancer.   GUIDELINES FOR INCREASING FIBER IN THE DIET  Start adding fiber to the diet slowly. A gradual increase of about 5 more grams (2 slices of whole-wheat bread, 2 servings of most fruits or vegetables, or 1 bowl of high-fiber cereal) per day is best. Too rapid an increase in fiber may result in constipation, flatulence, and bloating.   Drink enough water and fluids to keep your urine clear or pale yellow. Water, juice, or caffeine-free drinks are recommended. Not drinking enough fluid may cause constipation.   Eat a variety of high-fiber foods  rather than one type of fiber.   Try to increase your intake of fiber through using high-fiber foods rather than fiber pills or supplements that contain small amounts of fiber.   The goal is to change the types of food eaten. Do not supplement your present diet with high-fiber foods, but replace foods in your present diet.   INCLUDE A VARIETY OF FIBER SOURCES  Replace refined and processed grains with whole grains, canned fruits with fresh fruits, and incorporate other fiber sources. White rice, white breads, and most bakery goods contain little or no fiber.   Brown whole-grain rice, buckwheat oats, and many fruits and vegetables are all good sources of fiber. These include: broccoli, Brussels sprouts, cabbage, cauliflower, beets, sweet potatoes, white potatoes (skin on), carrots, tomatoes, eggplant, squash, berries, fresh fruits, and dried fruits.   Cereals appear to be the richest source of fiber. Cereal fiber is found in whole grains and bran. Bran is the fiber-rich outer coat of cereal grain, which is largely removed in refining. In whole-grain cereals, the bran remains. In breakfast cereals, the largest amount of fiber is found in those with "bran" in their names. The fiber content is sometimes indicated on the label.   You may need to include additional fruits and vegetables each day.   In baking, for 1 cup white flour, you may use the following substitutions:   1 cup whole-wheat flour minus 2 tablespoons.   1/2 cup white flour plus 1/2 cup whole-wheat flour.

## 2018-01-26 NOTE — Progress Notes (Signed)
Subjective:    Patient ID: Deanna Thompson, female    DOB: Apr 12, 1955, 62 y.o.   MRN: [id number]  Kathyrn Drown, MD  HPI Feels ok. Since stroke been dealing with vertigo and dizziness. Had a TIA that lasted 30-40 secs recently. Exhausted all the time. CHANGED FROM [place] TO BRANCH. ZANTAC CONTAMINATED. WANTS TO KNOW AN ALTERNATIVE. EATS CRAP LOAD OF GRAPES. MAY HAVE NL, PENCIL LIKE, TO WATERY STOOLS. OCCASIONAL CONSTIPATION RELIEVED WITH LINZESS AND NOW DOWN TO COLACE BID TO QD. HEARTBURN NOT CONTROLLED SINCE OFF OF ZANTAC.  PT DENIES FEVER, CHILLS, HEMATOCHEZIA, HEMATEMESIS, nausea, vomiting, melena, CHEST PAIN, SHORTNESS OF BREATH,  CHANGE IN BOWEL IN HABITS, abdominal pain, OR problems swallowing.  Past Medical History:  Diagnosis Date  . Abdominal hernia   . Abscess of pulp of tooth 12/12/14   left side  . Anxiety   . Arthritis    back  and down flank  right side more than left  . B12 deficiency 06/12/2015  . Brain aneurysm    METAL COIL PREVENTS MRI IMAGING and CLIP  . Cirrhosis ([place])    Presumably alcoholic cirrhosis  . Complication of anesthesia   . Depression   . ETOH abuse    Quit in 07/2011   . Fatigue    due to Sjogren's disease  . Folate deficiency 06/30/2016  . GERD (gastroesophageal reflux disease)   . HTN (hypertension)    no longer medicated, dated on 04/25/12  . IBS (irritable bowel syndrome)   . Iron deficiency anemia due to chronic blood loss   . Myofascial pain   . Panic attack   . PONV (postoperative nausea and vomiting)   . Psoriatic arthritis ([place])   . Right inguinal hernia 08/16/2012  . Seizures ([place]) 1997   1 seizure from coil insertion but no more seizures and no meds for seizures  . Sjogren's disease ([place])   . [place] HEAD 02/03/2007   Qualifier: Diagnosis of  By: Aline Brochure MD, Dorothyann Peng    . 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([place])    Past Surgical History:  Procedure Laterality Date  . AGILE CAPSULE N/A 06/21/2014   Procedure: AGILE CAPSULE;  Surgeon: Danie Binder, MD;  [facility];  Service: Endoscopy;  Laterality: N/A;  0700  . BIOPSY  10/13/2011   SLF: mild gastritis/duodenal polypoid lesion   . CEREBRAL ANEURYSM REPAIR  1997  . COLONOSCOPY  10/13/2011   [facility]:NOBSJGGE hemorrhoids/varices rectal/lesion in ascending colon(HYPERPLASTIC POLYP)  . COLONOSCOPY N/A 01/17/2013   SLF: 1. Internal hemorrhoids 2. Ascending colon polyps 3.  Rectal varices.   . COLONOSCOPY N/A 08/10/2016   Dr. Oneida Alar: redundant left colon. external and internal hemorhroids. 5-10 year surveillance  . ESOPHAGOGASTRODUODENOSCOPY N/A 06/29/2014   SLF: 1. anemia due to polypoid lesions in teh antrum, and duodenal polyps 2. single duodenum AVM  . FLEXIBLE SIGMOIDOSCOPY N/A 01/21/2016   Procedure: FLEXIBLE SIGMOIDOSCOPY;  Surgeon: Danie Binder, MD;  [facility];  Service: Endoscopy;  Laterality: N/A;  245  . GIVENS CAPSULE STUDY N/A 06/29/2014   Procedure: GIVENS CAPSULE STUDY;  Surgeon: Danie Binder, MD;  [facility];  Service: Endoscopy;  Laterality: N/A;  . GIVENS CAPSULE STUDY N/A 07/13/2014   Procedure: GIVENS CAPSULE STUDY;  Surgeon: Danie Binder, MD;  [facility];  Service: Endoscopy;  Laterality: N/A;  700  . HEMORRHOID BANDING N/A 01/21/2016   Procedure: HEMORRHOID BANDING;  Surgeon: Danie Binder, MD;  [facility];  Service: Endoscopy;  Laterality: N/A;  . INGUINAL HERNIA REPAIR Right 08/28/2013   Procedure: RIGHT NGUINAL HERNIORRHAPHY WITH MESH;  Surgeon: Jamesetta So, MD;  Location: [facility];  Service: General;  Laterality: Right;  . INSERTION OF MESH N/A 06/09/2013   Procedure: INSERTION OF MESH;  Surgeon: Jamesetta So, MD;  Location: [facility];  Service: General;  Laterality: N/A;  . INSERTION OF MESH Right 08/28/2013   Procedure: INSERTION OF MESH;  Surgeon: Jamesetta So, MD;  Location: [facility];  Service: General;  Laterality: Right;  . PARACENTESIS  Feb 2014   10.7  liters  . TOOTH EXTRACTION Left 12/12/14  . UMBILICAL HERNIA REPAIR N/A 06/09/2013   Procedure: UMBILICAL HERNIORRHAPHY WITH MESH;  Surgeon: Jamesetta So, MD;  Location: [facility];  Service: General;  Laterality: N/A;   Allergies  Allergen Reactions  . Omeprazole Shortness Of Breath  . Orencia [Abatacept] Anaphylaxis, Hives and Rash  . Other Shortness Of Breath and Rash    Red Meat Causes shortness of breath, rash and flu like symptoms.   . Phenytoin Rash  . Keflex [Cephalexin] Other (See Comments)    Severe yeast infection  . Phenergan [Promethazine Hcl] Other (See Comments)    Not in right state of mind, HALLUCINATION   Current Outpatient Medications  Medication Sig    . aspirin 325 MG tablet Take 325 mg by mouth daily.    Marland Kitchen atorvastatin (LIPITOR) 40 MG tablet TAKE (1) TABLET BY MOUTH ONCE DAILY.    . bumetanide (BUMEX) 1 MG tablet TAKE (1) TABLET BY MOUTH (2) TIMES DAILY. (Patient taking differently: Take 1 mg by mouth 2 (two) times daily. )    . buPROPion (WELLBUTRIN SR) 100 MG 12 hr tablet Take 1 tablet (100 mg total) by mouth 2 (two) times daily.    Marland Kitchen docusate sodium (COLACE) 100 MG capsule Take 100 mg by mouth daily.     Marland Kitchen escitalopram (LEXAPRO) 20 MG tablet TAKE (1) TABLET BY MOUTH AT BEDTIME.    . fluticasone (FLONASE) 50 MCG/ACT nasal spray Place 1 spray into both nostrils See admin instructions. Instill 1 spray into each nostril every morning, may also use later in the day as needed for sinus congestion    . hydrocortisone cream 1 % Apply 1 application topically 2 (two) times daily as needed (eczema in ears).    Marland Kitchen Lifitegrast (XIIDRA) 5 % SOLN Place 1 drop into both eyes 2 (two) times daily.    Marland Kitchen linaclotide (LINZESS) 145 MCG CAPS capsule Take 1 capsule (145 mcg total) by mouth daily before breakfast.    . loratadine (CLARITIN) 10 MG tablet Take 10 mg by mouth daily.    Marland Kitchen losartan (COZAAR) 25 MG tablet TAKE (1) TABLET BY MOUTH ONCE DAILY.    . meclizine (ANTIVERT) 25 MG tablet  Take 1 tablet (25 mg total) by mouth 3 (three) times daily as needed for dizziness.    . Multiple Vitamin (MULTIVITAMIN WITH MINERALS) TABS tablet Take 1 tablet by mouth daily.    . ondansetron (ZOFRAN) 4 MG tablet Take 1 tablet (4 mg total) by mouth every 8 (eight) hours as needed for nausea or vomiting.    Marland Kitchen LOSARTAN  25 MG DAILY    . ranitidine (ZANTAC) 150 MG tablet TAKE (1) TABLET BY MOUTH TWICE DAILY.    . Simethicone (GAS-X PO) Take 1 tablet by mouth 2 (two) times daily as needed (bloating/flatulence).    . sodium chloride (OCEAN) 0.65 %  nasal spray Place 1 spray into the nose 2 (two) times daily as needed for congestion.     Marland Kitchen spironolactone (ALDACTONE) 100 MG tablet Take 50 mg by mouth 2 (two) times daily.      Review of Systems PER HPI OTHERWISE ALL SYSTEMS ARE NEGATIVE.    Objective:   Physical Exam  Constitutional: She is oriented to person, place, and time. She appears well-developed and well-nourished. No distress.  HENT:  Head: Normocephalic and atraumatic.  Mouth/Throat: Oropharynx is clear and moist. No oropharyngeal exudate.  Eyes: Pupils are equal, round, and reactive to light. No scleral icterus.  Neck: Normal range of motion. Neck supple.  Cardiovascular: Normal rate, regular rhythm and normal heart sounds.  Pulmonary/Chest: Effort normal and breath sounds normal. No respiratory distress.  Abdominal: Soft. Bowel sounds are normal. She exhibits no distension. There is no tenderness.  Musculoskeletal: She exhibits no edema.  Lymphadenopathy:    She has no cervical adenopathy.  Neurological: She is alert and oriented to person, place, and time.  NO  NEW FOCAL DEFICITS  Psychiatric: She has a normal mood and affect.  Vitals reviewed.     Assessment & Plan:

## 2018-01-27 NOTE — Progress Notes (Signed)
CC'D TO PCP °

## 2018-01-27 NOTE — Progress Notes (Signed)
Left the result on VM.

## 2018-02-07 ENCOUNTER — Telehealth: Payer: Self-pay | Admitting: Neurology

## 2018-02-07 NOTE — Telephone Encounter (Signed)
Pt requesting a call stating she has experiencing worsening vertigo since Saturday 11/9. Requesting a call back to discuss if Dr. Leonie Man would feel comfortable ordering PT for the pt

## 2018-02-07 NOTE — Telephone Encounter (Signed)
DR.Sethi pt would like therapy order for vertigo at neuro rehab next door.  Rn call patient about having vertigo since Saturday. Pt stated on Saturday she had a major coughing spell and started having vertigo really bad.Pt last seen therapy in 10/2017 and was discharge with home exercises. PT stated she has some home exercises but not the ones for vertigo.Rn stated message will be sent to Dr. Leonie Man. PT verbalized understanding.

## 2018-02-08 ENCOUNTER — Other Ambulatory Visit: Payer: Self-pay | Admitting: Neurology

## 2018-02-08 DIAGNOSIS — R42 Dizziness and giddiness: Secondary | ICD-10-CM

## 2018-02-08 NOTE — Telephone Encounter (Signed)
Ok I placed order

## 2018-02-09 NOTE — Telephone Encounter (Signed)
I called Patient and left her a voice mail relaying to call [phone]  To get her apt scheduled with Neuro- rehab . Referral has been sent. Thanks Hinton Dyer.

## 2018-02-17 ENCOUNTER — Other Ambulatory Visit: Payer: Self-pay

## 2018-02-17 ENCOUNTER — Ambulatory Visit: Payer: Commercial Managed Care - PPO | Attending: Family Medicine

## 2018-02-17 DIAGNOSIS — R42 Dizziness and giddiness: Secondary | ICD-10-CM

## 2018-02-17 DIAGNOSIS — R2689 Other abnormalities of gait and mobility: Secondary | ICD-10-CM | POA: Insufficient documentation

## 2018-02-17 DIAGNOSIS — H8113 Benign paroxysmal vertigo, bilateral: Secondary | ICD-10-CM | POA: Insufficient documentation

## 2018-02-17 NOTE — Therapy (Addendum)
[facility] [address] [place], [place], [address] Phone: [phone]   Fax:  [phone]  Physical Therapy Evaluation  Patient Details  Name: Deanna Thompson MRN: [id number] Date of Birth: 02/10/1956 Referring Provider (PT): Dr. Leonie Man   Encounter Date: 02/17/2018  PT End of Session - 02/17/18 1038    Visit Number  1    Number of Visits  5    Date for PT Re-Evaluation  04/18/18    Authorization Type  [employer], UMR 30 VL-sent email to [employer] limit    Authorization - Visit Number  16    Authorization - Number of Visits  30    PT Start Time  0935    PT Stop Time  1017    PT Time Calculation (min)  42 min    Equipment Utilized During Treatment  --   S for safety   Activity Tolerance  Patient tolerated treatment well    Behavior During Therapy  WFL for tasks assessed/performed       Past Medical History:  Diagnosis Date  . Abdominal hernia   . Abscess of pulp of tooth 12/12/14   left side  . Anxiety   . Arthritis    back  and down flank  right side more than left  . B12 deficiency 06/12/2015  . Brain aneurysm    METAL COIL PREVENTS MRI IMAGING and CLIP  . Cirrhosis ([place])    Presumably alcoholic cirrhosis  . Complication of anesthesia   . Depression   . ETOH abuse    Quit in 07/2011   . Fatigue    due to Sjogren's disease  . Folate deficiency 06/30/2016  . GERD (gastroesophageal reflux disease)   . HTN (hypertension)    no longer medicated, dated on 04/25/12  . IBS (irritable bowel syndrome)   . Iron deficiency anemia due to chronic blood loss   . Myofascial pain   . Panic attack   . PONV (postoperative nausea and vomiting)   . Psoriatic arthritis ([place])   . Right inguinal hernia 08/16/2012  . Seizures ([place]) 1997   1 seizure from coil insertion but no more seizures and no meds for seizures  . Sjogren's disease ([place])   . [employer] HEAD 02/03/2007   Qualifier: Diagnosis of  By: Aline Brochure MD,  Dorothyann Peng    . 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([place])     Past Surgical History:  Procedure Laterality Date  . AGILE CAPSULE N/A 06/21/2014   Procedure: AGILE CAPSULE;  Surgeon: Danie Binder, MD;  [facility];  Service: Endoscopy;  Laterality: N/A;  0700  . BIOPSY  10/13/2011   SLF: mild gastritis/duodenal polypoid lesion   . CEREBRAL ANEURYSM REPAIR  1997  . COLONOSCOPY  10/13/2011   [facility]:UMPNTIRW hemorrhoids/varices rectal/lesion in ascending colon(HYPERPLASTIC POLYP)  . COLONOSCOPY N/A 01/17/2013   SLF: 1. Internal hemorrhoids 2. Ascending colon polyps 3.  Rectal varices.   . COLONOSCOPY N/A 08/10/2016   Dr. Oneida Alar: redundant left colon. external and internal hemorhroids. 5-10 year surveillance  . ESOPHAGOGASTRODUODENOSCOPY N/A 06/29/2014   SLF: 1. anemia due to polypoid lesions in teh antrum, and duodenal polyps 2. single duodenum AVM  . FLEXIBLE SIGMOIDOSCOPY N/A 01/21/2016   Procedure: FLEXIBLE SIGMOIDOSCOPY;  Surgeon: Danie Binder, MD;  [facility];  Service: Endoscopy;  Laterality: N/A;  245  . GIVENS CAPSULE STUDY N/A 06/29/2014   Procedure: GIVENS CAPSULE STUDY;  Surgeon: Danie Binder, MD;  [facility];  Service: Endoscopy;  Laterality: N/A;  . GIVENS CAPSULE STUDY N/A 07/13/2014   Procedure: GIVENS CAPSULE STUDY;  Surgeon: Danie Binder, MD;  [facility];  Service: Endoscopy;  Laterality: N/A;  700  . HEMORRHOID BANDING N/A 01/21/2016   Procedure: HEMORRHOID BANDING;  Surgeon: Danie Binder, MD;  [facility];  Service: Endoscopy;  Laterality: N/A;  . INGUINAL HERNIA REPAIR Right 08/28/2013   Procedure: RIGHT NGUINAL HERNIORRHAPHY WITH MESH;  Surgeon: Jamesetta So, MD;  Location: [facility];  Service: General;  Laterality: Right;  . INSERTION OF MESH N/A 06/09/2013   Procedure: INSERTION OF MESH;  Surgeon: Jamesetta So, MD;  Location: [facility];  Service: General;  Laterality: N/A;  . INSERTION OF MESH Right 08/28/2013    Procedure: INSERTION OF MESH;  Surgeon: Jamesetta So, MD;  Location: [facility];  Service: General;  Laterality: Right;  . PARACENTESIS  Feb 2014   10.7 liters  . TOOTH EXTRACTION Left 12/12/14  . UMBILICAL HERNIA REPAIR N/A 06/09/2013   Procedure: UMBILICAL HERNIORRHAPHY WITH MESH;  Surgeon: Jamesetta So, MD;  Location: [facility];  Service: General;  Laterality: N/A;    There were no vitals filed for this visit.   Subjective Assessment - 02/17/18 0940    Subjective  Pt reported she always has intermittent dizziness, which has become worse over the last 6 weeks after a choking/coughing bout. Pt reported she's performed Brandt-Daroff and feels dizziness when lying on L side (spinning), sensation lasts approx. 5-10 sec. Pt denied LOH, weakness, severe HA, tinnitus. Pt was told by neurologist that she did not have a TIA recently that it was anxiety related.     Pertinent History  HTN, ETOH abuse, cirrhosis, brain aneurysm, Sjogren's disease, basilar aneurysm clip and coiling, Lt MCA aneurysm, potential TIA 10/2017-unable to perform MRI 2/2 aneurysm clipping    Diagnostic tests  CT scans x 2 (cannot do MRI becasue of the clips & coils);     Patient Stated Goals  Stop the dizziness     Currently in Pain?  No/denies         [facility] PT Assessment - 02/17/18 0945      Assessment   Medical Diagnosis  Vertigo    Referring Provider (PT)  Dr. Leonie Man    Onset Date/Surgical Date  12/30/17   approx. 6 weeks   Prior Therapy  for BPPV and after basilar aneurysm clipping      Precautions   Precautions  Fall    Precaution Comments  2/2 dizziness pt reports intermittent dizziness upon standing.       Restrictions   Weight Bearing Restrictions  No      Balance Screen   Has the patient had a decrease in activity level because of a fear of falling?   No    Is the patient reluctant to leave their home because of a fear of falling?   No      Home Film/video editor residence     Living Arrangements  Spouse/significant other    Available Help at Discharge  Family    Type of [place] to enter    Entrance Stairs-Number of Steps  5    Entrance Stairs-Rails  Right    [place]  Two level;Able to live on main level with bedroom/bathroom    Alternate Level Stairs-Number of Steps  12  Alternate Level Stairs-Rails  Can reach both    Home Equipment  None      Prior Function   Level of Independence  Independent    Vocation  Retired    Water quality scientist; sells at shows    Leisure  [employer], read      Cognition   Overall Cognitive Status  History of cognitive impairments - at baseline   reports impaired STM after craniectomy     Ambulation/Gait   Ambulation/Gait  Yes    Ambulation/Gait Assistance  5: Supervision    Ambulation/Gait Assistance Details  To ensure safety. No LOB but pt reported dizziness when she looks up.    Ambulation Distance (Feet)  75 Feet    Assistive device  None    Gait Pattern  Step-through pattern;Wide base of support    Ambulation Surface  Indoor;Level    Gait velocity  3.18f/sec. no AD           Vestibular Assessment - 02/17/18 0952      Symptom Behavior   Type of Dizziness  Spinning   and heavy feeling   Frequency of Dizziness  Daily    Duration of Dizziness  5-10 sec.     Aggravating Factors  Looking up to the ceiling   lying on L side   Relieving Factors  Rest      Occulomotor Exam   Occulomotor Alignment  Normal    Spontaneous  Absent    Gaze-induced  Absent    Smooth Pursuits  Intact    Saccades  Intact    Comment  B HIT (-) with no dizziness reported. Convergence: reported double vision at approx. 6" from pt's nose but eyes move symmetrically.       Vestibulo-Occular Reflex   VOR 1 Head Only (x 1 viewing)  Pt reported slight wooziness, nausea (2/10) during VOR but able to perform.     VOR Cancellation  Corrective saccades   with pt reporting nausea but no  dizziness     Positional Testing   Dix-Hallpike  Dix-Hallpike Right;Dix-Hallpike Left    Horizontal Canal Testing  Horizontal Canal Right;Horizontal Canal Left      Dix-Hallpike Right   Dix-Hallpike Right Duration  No nystagmus but pt reported that dizziness felt like "it could start".    Dix-Hallpike Right Symptoms  No nystagmus      Dix-Hallpike Left   Dix-Hallpike Left Duration  60 sec. with 5/10 dizziness.    Dix-Hallpike Left Symptoms  Downbeat, left rotatory nystagmus      Horizontal Canal Right   Horizontal Canal Right Duration  2-3 sec.    Horizontal Canal Right Symptoms  Nystagmus   challenging to determine direction as it was 1-2 beats     Horizontal Canal Left   Horizontal Canal Left Duration  <10 sec. with 5/10 dizziness    Horizontal Canal Left Symptoms  Nystagmus   L upbeating torsional nystagmus     Positional Sensitivities   Up from Right Hallpike  Mild dizziness    Up from Left Hallpike  Mild dizziness          Objective measurements completed on examination: See above findings.       Vestibular Treatment/Exercise - 02/17/18 1034      Vestibular Treatment/Exercise   Vestibular Treatment Provided  Canalith Repositioning    Canalith Repositioning  Semont Procedure Left Posterior   based on s/s bil   Habituation Exercises  BNestor Lewandowsky  Semont Procedure Left Posterior   Number of Reps   1    Overall Response  Improved Symptoms    Response Details   Pt denied dizziness after treatment, nystagmus noted upon return to seated positions from L sidelying with no nystagmus/dizziness after second position.      Nestor Lewandowsky   Number of Reps   1    Symptom Description   Pt denied s/s, performed to review previous HEP from last PT POC.             PT Education - 02/17/18 1037    Education provided  Yes    Education Details  PT discussed exam findings, PT POC, duration, and frequency. Review habituation HEP (Brandt-Daroff).     Person(s)  Educated  Patient    Methods  Explanation    Comprehension  Verbalized understanding       PT Short Term Goals - 02/17/18 1055      PT SHORT TERM GOAL #1   Title  same as LTGs        PT Long Term Goals - 02/17/18 1055      PT LONG TERM GOAL #1   Title  Patient will be independent with HEP upon d/c from to improve dizziness, balance, and gait. TARGET DATE FOR ALL LTGS: 121/19/19    Status  New      PT LONG TERM GOAL #2   Title  Patient will have negative positional testing for BPPV to improve QOL and reduce dizziness during ADLs.     Status  New      PT LONG TERM GOAL #3   Title  Perform FGA and write goal as indicated.     Status  New      PT LONG TERM GOAL #4   Title  Pt will amb. 500' over uneven/even terrain while performing head turns, IND to ensure safety during functional mobility.     Status  New             Plan - 02/17/18 1042    Clinical Impression Statement  Pt is a pleasant 62y/o female presenting to [facility] neuro for vertigo, she is familar to this clinic as she's been treated here before for dizziness. Pt's PMH is significant for the following: HTN, ETOH abuse, cirrhosis, brain aneurysm, Sjogren's disease, basilar aneurysm clip and coiling, Lt MCA aneurysm, potential TIA 10/2017-unable to perform MRI 2/2 aneurysm clipping. Pt's gait speed was WNL and indicates pt is able to safely amb. in the community. PT will formally assess pt's balance once vertigo s/s have subsided but impaired balance suspected based on pt reporting drifting while amb. and unsteadiness upon standing when dizziness present. Pt experienced downbeating torsional nystagmus during L Dix-Hallpike with concordant dizziness (5/10), dizziness during R horizontal canal testing with 1-2 beats of nystagmus, and 5/10 dizziness during L horizontal canal testing with L upbeating torsional nystagmus. Pt's BPPV appears to multi-canal, Bil. Therefore, PT had pt perform L Semont treatment with s/s improving.  Howver, pt may benefit from demi-Semont procdure next session based on pt's s/s. The following deficits were noted upon exam: dizziness, impaired balance per pt report, gait deviations. Pt would benefit from skilled PT to improve safety during functional mobility.     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, dizziness is chronic and causes pt to be more cautious, pt's spouse can assist pt prn, pt with hx of  anxiety and depression, central etiologies do contribute to dizziness with pt also experiencing peripheral vertigo    Clinical Presentation  Evolving    Clinical Presentation due to:  HTN, ETOH abuse, cirrhosis, brain aneurysm, Sjogren's disease, basilar aneurysm clip and coiling, Lt MCA aneurysm, potential TIA 10/2017-unable to perform MRI 2/2 aneurysm clipping    Rehab Potential  Good    Clinical Impairments Affecting Rehab Potential  anxiety, mixed peripheral and central vestibular deficits    PT Frequency  1x / week    PT Duration  4 weeks    PT Treatment/Interventions  ADLs/Self Care Home Management;Canalith Repostioning;Therapeutic activities;Therapeutic exercise;Balance training;Neuromuscular re-education;Cognitive remediation;Patient/family education;Passive range of motion;Vestibular;Visual/perceptual remediation/compensation;Biofeedback    PT Next Visit Plan  Reasses for BPPV in all canals-may need to modify treatment to demi Semont.    Consulted and Agree with Plan of Care  Patient       Patient will benefit from skilled therapeutic intervention in order to improve the following deficits and impairments:  Abnormal gait, Decreased activity tolerance, Decreased balance, Decreased cognition, Decreased mobility, Decreased knowledge of use of DME, Dizziness, Difficulty walking  Visit Diagnosis: BPPV (benign paroxysmal positional vertigo), bilateral - Plan: PT plan of care cert/re-cert  Other abnormalities of gait  and mobility - Plan: PT plan of care cert/re-cert  Dizziness and giddiness - Plan: PT plan of care cert/re-cert     Problem List Patient Active Problem List   Diagnosis Date Noted  . Benign paroxysmal positional vertigo   . 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 Essential hypertension 07/31/2017  . GERD (gastroesophageal reflux disease) 07/31/2017  . Alcohol abuse 07/31/2017  . Brain aneurysm 07/31/2017  . CKD (chronic kidney disease), stage III ([place]) 07/31/2017  . LBBB (left bundle branch block) 07/31/2017  . Dizziness 07/31/2017  . Folate deficiency 06/30/2016  . Constipation 04/01/2016  . 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 Hemorrhoids, internal, with bleeding 01/08/2016  . B12 deficiency 06/12/2015  . Psoriatic arthritis ([place]) 04/15/2015  . Nutritional anemia 09/19/2014  . Iron deficiency anemia due to chronic blood loss   . Insomnia 03/17/2014  . Depression 02/18/2014  . Labial swelling 08/23/2012  . Rash and nonspecific skin eruption 06/30/2012  . Colon polyps 11/18/2011  . Cirrhosis ([place]) 08/19/2011    Cristan Scherzer L 02/17/2018, 11:03 AM  [facility] [address] [place], [place], [address] Phone: [phone]   Fax:  [phone]  Name: Oyinkansola Truax MRN: [id number] Date of Birth: May 26, 1955  Geoffry Paradise, PT,DPT 02/17/18 11:03 AM Phone: [phone] Fax: [phone]

## 2018-02-23 ENCOUNTER — Ambulatory Visit: Payer: Commercial Managed Care - PPO | Admitting: Physical Therapy

## 2018-02-23 ENCOUNTER — Encounter: Payer: Self-pay | Admitting: Physical Therapy

## 2018-02-23 DIAGNOSIS — R42 Dizziness and giddiness: Secondary | ICD-10-CM

## 2018-02-23 DIAGNOSIS — H8113 Benign paroxysmal vertigo, bilateral: Secondary | ICD-10-CM | POA: Diagnosis not present

## 2018-02-23 NOTE — Therapy (Signed)
[facility] [address] [place], [place], [address] Phone: [phone]   Fax:  [phone]  Physical Therapy Treatment  Patient Details  Name: Deanna Thompson MRN: [id number] Date of Birth: 1955/11/24 Referring Provider (PT): Dr. Leonie Man   Encounter Date: 02/23/2018  PT End of Session - 02/23/18 0758    Visit Number  2    Number of Visits  5    Date for PT Re-Evaluation  04/18/18    Authorization Type  UHC, UMR 30 VL-sent email to [employer] limit    Authorization - Visit Number  16    Authorization - Number of Visits  30    PT Start Time  0759    PT Stop Time  0853    PT Time Calculation (min)  54 min    Equipment Utilized During Treatment  --   S for safety   Activity Tolerance  Treatment limited secondary to medical complications (Comment)   became very nauseated with repositioning; very imbalanced and had to lie down for ~20 minutes past end of session   Behavior During Therapy  Deanna Thompson for tasks assessed/performed       Past Medical History:  Diagnosis Date  . Abdominal hernia   . Abscess of pulp of tooth 12/12/14   left side  . Anxiety   . Arthritis    back  and down flank  right side more than left  . B12 deficiency 06/12/2015  . Brain aneurysm    METAL COIL PREVENTS MRI IMAGING and CLIP  . Cirrhosis ([place])    Presumably alcoholic cirrhosis  . Complication of anesthesia   . Depression   . ETOH abuse    Quit in 07/2011   . Fatigue    due to Sjogren's disease  . Folate deficiency 06/30/2016  . GERD (gastroesophageal reflux disease)   . HTN (hypertension)    no longer medicated, dated on 04/25/12  . IBS (irritable bowel syndrome)   . Iron deficiency anemia due to chronic blood loss   . Myofascial pain   . Panic attack   . PONV (postoperative nausea and vomiting)   . Psoriatic arthritis ([place])   . Right inguinal hernia 08/16/2012  . Seizures ([place]) 1997   1 seizure from coil insertion but no more  seizures and no meds for seizures  . Sjogren's disease ([place])   . [place] HEAD 02/03/2007   Qualifier: Diagnosis of  By: Aline Brochure MD, Dorothyann Peng    . 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([place])     Past Surgical History:  Procedure Laterality Date  . AGILE CAPSULE N/A 06/21/2014   Procedure: AGILE CAPSULE;  Surgeon: Danie Binder, MD;  [facility];  Service: Endoscopy;  Laterality: N/A;  0700  . BIOPSY  10/13/2011   SLF: mild gastritis/duodenal polypoid lesion   . CEREBRAL ANEURYSM REPAIR  1997  . COLONOSCOPY  10/13/2011   ASN:[id number] hemorrhoids/varices rectal/lesion in ascending colon(HYPERPLASTIC POLYP)  . COLONOSCOPY N/A 01/17/2013   SLF: 1. Internal hemorrhoids 2. Ascending colon polyps 3.  Rectal varices.   . COLONOSCOPY N/A 08/10/2016   Dr. Oneida Alar: redundant left colon. external and internal hemorhroids. 5-10 year surveillance  . ESOPHAGOGASTRODUODENOSCOPY N/A 06/29/2014   SLF: 1. anemia due to polypoid lesions in teh antrum, and duodenal polyps 2. single duodenum AVM  . FLEXIBLE SIGMOIDOSCOPY N/A 01/21/2016   Procedure: FLEXIBLE SIGMOIDOSCOPY;  Surgeon: Danie Binder, MD;  [facility];  Service: Endoscopy;  Laterality: N/A;  245  .  GIVENS CAPSULE STUDY N/A 06/29/2014   Procedure: GIVENS CAPSULE STUDY;  Surgeon: Danie Binder, MD;  [facility];  Service: Endoscopy;  Laterality: N/A;  . GIVENS CAPSULE STUDY N/A 07/13/2014   Procedure: GIVENS CAPSULE STUDY;  Surgeon: Danie Binder, MD;  [facility];  Service: Endoscopy;  Laterality: N/A;  700  . HEMORRHOID BANDING N/A 01/21/2016   Procedure: HEMORRHOID BANDING;  Surgeon: Danie Binder, MD;  [facility];  Service: Endoscopy;  Laterality: N/A;  . INGUINAL HERNIA REPAIR Right 08/28/2013   Procedure: RIGHT NGUINAL HERNIORRHAPHY WITH MESH;  Surgeon: Jamesetta So, MD;  Location: [facility];  Service: General;  Laterality: Right;  . INSERTION OF MESH N/A 06/09/2013    Procedure: INSERTION OF MESH;  Surgeon: Jamesetta So, MD;  Location: [facility];  Service: General;  Laterality: N/A;  . INSERTION OF MESH Right 08/28/2013   Procedure: INSERTION OF MESH;  Surgeon: Jamesetta So, MD;  Location: [facility];  Service: General;  Laterality: Right;  . PARACENTESIS  Feb 2014   10.7 liters  . TOOTH EXTRACTION Left 12/12/14  . UMBILICAL HERNIA REPAIR N/A 06/09/2013   Procedure: UMBILICAL HERNIORRHAPHY WITH MESH;  Surgeon: Jamesetta So, MD;  Location: [facility];  Service: General;  Laterality: N/A;    There were no vitals filed for this visit.  Subjective Assessment - 02/23/18 0759    Subjective  I got a lot better after first visit. Then Sun I was looking under something and rolled over onto my back and felt much worse. If I turn my head left ~30 degrees I feel it start.     Pertinent History  HTN, ETOH abuse, cirrhosis, brain aneurysm, Sjogren's disease, basilar aneurysm clip and coiling, Lt MCA aneurysm, potential TIA 10/2017-unable to perform MRI 2/2 aneurysm clipping    Diagnostic tests  CT scans x 2 (cannot do MRI becasue of the clips & coils);     Patient Stated Goals  Stop the dizziness     Currently in Pain?  No/denies             Vestibular Assessment - 02/23/18 0815      Dix-Hallpike Left   Dix-Hallpike Left Duration  15 sec after 5 sec delay    Dix-Hallpike Left Symptoms  Upbeat, left rotatory nystagmus      Sidelying Left   Sidelying Left Duration  5    Sidelying Left Symptoms  Upbeat, left rotatory nystagmus                Vestibular Treatment/Exercise - 02/23/18 0814      Vestibular Treatment/Exercise   Vestibular Treatment Provided  Canalith Repositioning    Canalith Repositioning  Semont Procedure Left Posterior;Epley Manuever Left    Habituation Exercises  Brandt Daroff       EPLEY MANUEVER LEFT   Number of Reps   2    Overall Response   Symptoms Worsened     RESPONSE DETAILS LEFT  1st rep felt a bit better; 2nd rep worse  "spinning trying to happen"      Semont Procedure Left Posterior   Number of Reps   1    Overall Response  Improved Symptoms    Response Details   better but still feels like it's trying to happen esp if moves eyes to the left      Nestor Lewandowsky   Symptom Description   reviewed importance; updated instrutions to include 2 slow reps and  3 fast reps            PT Education - 02/23/18 1231    Education Details  rationale for Brandt-Daroff    Person(s) Educated  Patient    Methods  Explanation    Comprehension  Verbalized understanding       PT Short Term Goals - 02/17/18 1055      PT SHORT TERM GOAL #1   Title  same as LTGs        PT Long Term Goals - 02/17/18 1055      PT LONG TERM GOAL #1   Title  Patient will be independent with HEP upon d/c from to improve dizziness, balance, and gait. TARGET DATE FOR ALL LTGS: 121/19/19    Status  New      PT LONG TERM GOAL #2   Title  Patient will have negative positional testing for BPPV to improve QOL and reduce dizziness during ADLs.     Status  New      PT LONG TERM GOAL #3   Title  Perform FGA and write goal as indicated.     Status  New      PT LONG TERM GOAL #4   Title  Pt will amb. 500' over uneven/even terrain while performing head turns, IND to ensure safety during functional mobility.     Status  New            Plan - 02/23/18 1233    Clinical Impression Statement  Patient presents reporting felt better for a few days after Left Semont done at time of evaluation. She had a set-back when lying down on the floor to reach under  furniture to retrieve object and bad bout of spinning as she rolled to her left onto her back. +left posterior canalithiasis with initial Hallpike-Dix. After left Epley felt slightly better. On repeat Hallpike-Dix she still had nystagmus and completed Epley again with pt reporting felt different than 1st time "like it's going to spin and then doesn't." Performed lt sidelying test with much  slower upbeating, left rotating nystagmus. As she had benefit from Semont previous session, completed left Semont with pt reporting slightly better symptoms, however overall worse than when she arrived (nauseated, offbalance). Pt positioned in reclined supine postion (HOB ~30) and rested x 10 minutes. Assisted up and walked 50 feet and felt she was not yet ready to drive. Again laid down and rested another 10 minutes and felt better and able to walk without imbalance out of the clinic.     Rehab Potential  Good    Clinical Impairments Affecting Rehab Potential  anxiety, mixed peripheral and central vestibular deficits    PT Frequency  1x / week    PT Duration  4 weeks    PT Treatment/Interventions  ADLs/Self Care Home Management;Canalith Repostioning;Therapeutic activities;Therapeutic exercise;Balance training;Neuromuscular re-education;Cognitive remediation;Patient/family education;Passive range of motion;Vestibular;Visual/perceptual remediation/compensation;Biofeedback    PT Next Visit Plan  Reasses for BPPV in all canals-may need to modify treatment to demi Semont.    Consulted and Agree with Plan of Care  Patient       Patient will benefit from skilled therapeutic intervention in order to improve the following deficits and impairments:  Abnormal gait, Decreased activity tolerance, Decreased balance, Decreased cognition, Decreased mobility, Decreased knowledge of use of DME, Dizziness, Difficulty walking  Visit Diagnosis: BPPV (benign paroxysmal positional vertigo), bilateral  Dizziness and giddiness     Problem List Patient Active Problem List   Diagnosis Date Noted  .  Benign paroxysmal positional vertigo   . 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 Essential hypertension 07/31/2017  . GERD (gastroesophageal reflux disease) 07/31/2017  . Alcohol abuse 07/31/2017  . Brain aneurysm 07/31/2017  . CKD (chronic kidney disease), stage III ([place]) 07/31/2017  . LBBB (left bundle branch block)  07/31/2017  . Dizziness 07/31/2017  . Folate deficiency 06/30/2016  . Constipation 04/01/2016  . 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 Hemorrhoids, internal, with bleeding 01/08/2016  . B12 deficiency 06/12/2015  . Psoriatic arthritis ([place]) 04/15/2015  . Nutritional anemia 09/19/2014  . Iron deficiency anemia due to chronic blood loss   . Insomnia 03/17/2014  . Depression 02/18/2014  . Labial swelling 08/23/2012  . Rash and nonspecific skin eruption 06/30/2012  . Colon polyps 11/18/2011  . Cirrhosis ([place]) 08/19/2011    Rexanne Mano, PT 02/23/2018, 12:41 PM  [facility] [address] [place], [place], [address] Phone: [phone]   Fax:  [phone]  Name: Deanna Thompson MRN: [id number] Date of Birth: 11-08-1955

## 2018-02-23 NOTE — Patient Instructions (Signed)
   Brandt-Daroff exercise:  Move to the left slowly and find the"sweet spot" where you feel symptoms. Let symptoms pass plus 15 seconds Move directly over to the right side with head turned to the left and find the "sweet spot" wait for symptoms to pass plus 15 seconds.   Repeat slowly x 2 reps   Then repeat same move as quickly as you can. When you lie down, wait for symptoms to pass plus 15 seconds. Then move to your right as quickly as you can and wait for symptoms to pass plus 15 seconds.  Repeat fast version 3 times

## 2018-02-25 ENCOUNTER — Other Ambulatory Visit: Payer: Self-pay | Admitting: Family Medicine

## 2018-03-03 ENCOUNTER — Ambulatory Visit: Payer: Commercial Managed Care - PPO

## 2018-03-04 ENCOUNTER — Telehealth: Payer: Self-pay | Admitting: Gastroenterology

## 2018-03-04 MED ORDER — BUMETANIDE 1 MG PO TABS: ORAL_TABLET | ORAL | 3 refills | Status: DC

## 2018-03-04 NOTE — Addendum Note (Signed)
Addended by: Annitta Needs on: 03/04/2018 10:45 AM   Modules accepted: Orders

## 2018-03-04 NOTE — Telephone Encounter (Signed)
Prescription sent to [facility] on [address].

## 2018-03-04 NOTE — Telephone Encounter (Signed)
[phone]  Please call patient, her pharmacy is out of her medication and she has a question about getting it sent somewhere else so she can have it.

## 2018-03-04 NOTE — Telephone Encounter (Signed)
Spoke with pt and [facility] and isn't available at her pharmacy anymore. She would like Rx sent to [facility].

## 2018-03-07 NOTE — Telephone Encounter (Signed)
Pt notified on 03/04/18.

## 2018-03-10 ENCOUNTER — Ambulatory Visit: Payer: Commercial Managed Care - PPO

## 2018-03-10 ENCOUNTER — Telehealth: Payer: Self-pay | Admitting: Gastroenterology

## 2018-03-10 DIAGNOSIS — R2689 Other abnormalities of gait and mobility: Secondary | ICD-10-CM | POA: Insufficient documentation

## 2018-03-10 DIAGNOSIS — H8113 Benign paroxysmal vertigo, bilateral: Secondary | ICD-10-CM | POA: Insufficient documentation

## 2018-03-10 DIAGNOSIS — R42 Dizziness and giddiness: Secondary | ICD-10-CM | POA: Insufficient documentation

## 2018-03-10 NOTE — Telephone Encounter (Signed)
Lm on hm and cell number. Waiting on a return call.

## 2018-03-10 NOTE — Telephone Encounter (Signed)
PLEASE CALL PATIENT,HER ARTHRITIS DOC WANTS TO PUT HER ON A MEDICATION AND SHE WANTS TO MAKE SURE IT IS OK WITH HER LIVER HISTORY

## 2018-03-10 NOTE — Therapy (Signed)
[facility] [address] [place], [place], [address] Phone: [phone]   Fax:  [phone]  Physical Therapy Treatment  Patient Details  Name: Deanna Thompson MRN: [id number] Date of Birth: February 26, 1956 Referring Provider (PT): Dr. Leonie Man   Encounter Date: 03/10/2018  PT End of Session - 03/10/18 0907    Visit Number  2    Number of Visits  5    Date for PT Re-Evaluation  04/18/18    Authorization Type  UHC, UMR 30 VL-sent email to [employer] limit    Authorization - Visit Number  16    Authorization - Number of Visits  30    PT Start Time  [phone]    PT Stop Time  0938   no charge   PT Time Calculation (min)  7 min       Past Medical History:  Diagnosis Date  . Abdominal hernia   . Abscess of pulp of tooth 12/12/14   left side  . Anxiety   . Arthritis    back  and down flank  right side more than left  . B12 deficiency 06/12/2015  . Brain aneurysm    METAL COIL PREVENTS MRI IMAGING and CLIP  . Cirrhosis ([place])    Presumably alcoholic cirrhosis  . Complication of anesthesia   . Depression   . ETOH abuse    Quit in 07/2011   . Fatigue    due to Sjogren's disease  . Folate deficiency 06/30/2016  . GERD (gastroesophageal reflux disease)   . HTN (hypertension)    no longer medicated, dated on 04/25/12  . IBS (irritable bowel syndrome)   . Iron deficiency anemia due to chronic blood loss   . Myofascial pain   . Panic attack   . PONV (postoperative nausea and vomiting)   . Psoriatic arthritis ([place])   . Right inguinal hernia 08/16/2012  . Seizures ([place]) 1997   1 seizure from coil insertion but no more seizures and no meds for seizures  . Sjogren's disease ([place])   . [place] HEAD 02/03/2007   Qualifier: Diagnosis of  By: Aline Brochure MD, Dorothyann Peng    . 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([place])     Past Surgical History:  Procedure Laterality Date  . AGILE CAPSULE N/A 06/21/2014   Procedure:  AGILE CAPSULE;  Surgeon: Danie Binder, MD;  [facility];  Service: Endoscopy;  Laterality: N/A;  0700  . BIOPSY  10/13/2011   SLF: mild gastritis/duodenal polypoid lesion   . CEREBRAL ANEURYSM REPAIR  1997  . COLONOSCOPY  10/13/2011   HWE:[id number] hemorrhoids/varices rectal/lesion in ascending colon(HYPERPLASTIC POLYP)  . COLONOSCOPY N/A 01/17/2013   SLF: 1. Internal hemorrhoids 2. Ascending colon polyps 3.  Rectal varices.   . COLONOSCOPY N/A 08/10/2016   Dr. Oneida Alar: redundant left colon. external and internal hemorhroids. 5-10 year surveillance  . ESOPHAGOGASTRODUODENOSCOPY N/A 06/29/2014   SLF: 1. anemia due to polypoid lesions in teh antrum, and duodenal polyps 2. single duodenum AVM  . FLEXIBLE SIGMOIDOSCOPY N/A 01/21/2016   Procedure: FLEXIBLE SIGMOIDOSCOPY;  Surgeon: Danie Binder, MD;  [facility];  Service: Endoscopy;  Laterality: N/A;  245  . GIVENS CAPSULE STUDY N/A 06/29/2014   Procedure: GIVENS CAPSULE STUDY;  Surgeon: Danie Binder, MD;  [facility];  Service: Endoscopy;  Laterality: N/A;  . GIVENS CAPSULE STUDY N/A 07/13/2014   Procedure: GIVENS CAPSULE STUDY;  Surgeon: Danie Binder, MD;  [facility];  Service: Endoscopy;  Laterality: N/A;  700  . HEMORRHOID BANDING N/A 01/21/2016   Procedure: HEMORRHOID BANDING;  Surgeon: Danie Binder, MD;  [facility];  Service: Endoscopy;  Laterality: N/A;  . INGUINAL HERNIA REPAIR Right 08/28/2013   Procedure: RIGHT NGUINAL HERNIORRHAPHY WITH MESH;  Surgeon: Jamesetta So, MD;  Location: [facility];  Service: General;  Laterality: Right;  . INSERTION OF MESH N/A 06/09/2013   Procedure: INSERTION OF MESH;  Surgeon: Jamesetta So, MD;  Location: [facility];  Service: General;  Laterality: N/A;  . INSERTION OF MESH Right 08/28/2013   Procedure: INSERTION OF MESH;  Surgeon: Jamesetta So, MD;  Location: [facility];  Service: General;  Laterality: Right;  . PARACENTESIS  Feb 2014   10.7 liters  .  TOOTH EXTRACTION Left 12/12/14  . UMBILICAL HERNIA REPAIR N/A 06/09/2013   Procedure: UMBILICAL HERNIORRHAPHY WITH MESH;  Surgeon: Jamesetta So, MD;  Location: [facility];  Service: General;  Laterality: N/A;    There were no vitals filed for this visit.  Subjective Assessment - 03/10/18 0851    Subjective  Pt reported she felt worse after last treatment but then felt better after a few days. Pt felt like Nausea and dizziness has improved. Pt has not performed HEP as she's been busy and having fatigue/IBS issues. Pt would like to hold until next week, as she believes s/s are resolved but she's dealing with IBS issues.     Pertinent History  HTN, ETOH abuse, cirrhosis, brain aneurysm, Sjogren's disease, basilar aneurysm clip and coiling, Lt MCA aneurysm, potential TIA 10/2017-unable to perform MRI 2/2 aneurysm clipping    Patient Stated Goals  Stop the dizziness     Currently in Pain?  No/denies                                 PT Short Term Goals - 02/17/18 1055      PT SHORT TERM GOAL #1   Title  same as LTGs        PT Long Term Goals - 02/17/18 1055      PT LONG TERM GOAL #1   Title  Patient will be independent with HEP upon d/c from to improve dizziness, balance, and gait. TARGET DATE FOR ALL LTGS: 121/19/19    Status  New      PT LONG TERM GOAL #2   Title  Patient will have negative positional testing for BPPV to improve QOL and reduce dizziness during ADLs.     Status  New      PT LONG TERM GOAL #3   Title  Perform FGA and write goal as indicated.     Status  New      PT LONG TERM GOAL #4   Title  Pt will amb. 500' over uneven/even terrain while performing head turns, IND to ensure safety during functional mobility.     Status  New            Plan - 03/10/18 5643    Clinical Impression Statement  No charge for visit, see above.     Rehab Potential  Good    Clinical Impairments Affecting Rehab Potential  anxiety, mixed peripheral and central  vestibular deficits    PT Frequency  1x / week    PT Duration  4 weeks    PT Treatment/Interventions  ADLs/Self Care Home Management;Canalith Repostioning;Therapeutic activities;Therapeutic exercise;Balance training;Neuromuscular  re-education;Cognitive remediation;Patient/family education;Passive range of motion;Vestibular;Visual/perceptual remediation/compensation;Biofeedback    PT Next Visit Plan  Reasses for BPPV in all canals-may need to modify treatment to demi Semont.    Consulted and Agree with Plan of Care  Patient       Patient will benefit from skilled therapeutic intervention in order to improve the following deficits and impairments:  Abnormal gait, Decreased activity tolerance, Decreased balance, Decreased cognition, Decreased mobility, Decreased knowledge of use of DME, Dizziness, Difficulty walking  Visit Diagnosis: Dizziness and giddiness     Problem List Patient Active Problem List   Diagnosis Date Noted  . Benign paroxysmal positional vertigo   . 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 Essential hypertension 07/31/2017  . GERD (gastroesophageal reflux disease) 07/31/2017  . Alcohol abuse 07/31/2017  . Brain aneurysm 07/31/2017  . CKD (chronic kidney disease), stage III ([place]) 07/31/2017  . LBBB (left bundle branch block) 07/31/2017  . Dizziness 07/31/2017  . Folate deficiency 06/30/2016  . Constipation 04/01/2016  . 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 Hemorrhoids, internal, with bleeding 01/08/2016  . B12 deficiency 06/12/2015  . Psoriatic arthritis ([place]) 04/15/2015  . Nutritional anemia 09/19/2014  . Iron deficiency anemia due to chronic blood loss   . Insomnia 03/17/2014  . Depression 02/18/2014  . Labial swelling 08/23/2012  . Rash and nonspecific skin eruption 06/30/2012  . Colon polyps 11/18/2011  . Cirrhosis ([place]) 08/19/2011    Jezreel Sisk L 03/10/2018, 9:07 AM  [facility] [address] [place], [place], [address] Phone: [phone]   Fax:  [phone]  Name: Avannah Decker MRN: [id number] Date of Birth: 1956-01-04  Geoffry Paradise, PT,DPT 03/10/18 9:07 AM Phone: [phone] Fax: [phone]

## 2018-03-11 NOTE — Telephone Encounter (Signed)
Doctor wants her to start [place]. Please advise!

## 2018-03-11 NOTE — Telephone Encounter (Signed)
PLEASE CALL PT. Deanna Thompson should be fine for her liver.

## 2018-03-11 NOTE — Telephone Encounter (Signed)
Pts husband is aware.  °

## 2018-03-17 ENCOUNTER — Ambulatory Visit: Payer: Commercial Managed Care - PPO | Attending: Family Medicine

## 2018-03-17 DIAGNOSIS — H8113 Benign paroxysmal vertigo, bilateral: Secondary | ICD-10-CM

## 2018-03-17 DIAGNOSIS — R2689 Other abnormalities of gait and mobility: Secondary | ICD-10-CM

## 2018-03-17 DIAGNOSIS — R42 Dizziness and giddiness: Secondary | ICD-10-CM

## 2018-03-17 NOTE — Therapy (Signed)
[facility] [address] [place], [place], [address] Phone: [phone]   Fax:  [phone]  Physical Therapy Treatment  Patient Details  Name: Deanna Thompson MRN: [id number] Date of Birth: 1955-11-03 Referring Provider (PT): Dr. Leonie Man   Encounter Date: 03/17/2018  PT End of Session - 03/17/18 0903    Visit Number  3    Number of Visits  5    Date for PT Re-Evaluation  04/18/18    Authorization Type  [employer], UMR 30 VL-sent email to [employer] limit    Authorization - Visit Number  17    Authorization - Number of Visits  30    PT Start Time  [phone]   pt late   PT Stop Time  0859   d/c   PT Time Calculation (min)  10 min    Activity Tolerance  Patient tolerated treatment well    Behavior During Therapy  [facility] for tasks assessed/performed       Past Medical History:  Diagnosis Date  . Abdominal hernia   . Abscess of pulp of tooth 12/12/14   left side  . Anxiety   . Arthritis    back  and down flank  right side more than left  . B12 deficiency 06/12/2015  . Brain aneurysm    METAL COIL PREVENTS MRI IMAGING and CLIP  . Cirrhosis ([place])    Presumably alcoholic cirrhosis  . Complication of anesthesia   . Depression   . ETOH abuse    Quit in 07/2011   . Fatigue    due to Sjogren's disease  . Folate deficiency 06/30/2016  . GERD (gastroesophageal reflux disease)   . HTN (hypertension)    no longer medicated, dated on 04/25/12  . IBS (irritable bowel syndrome)   . Iron deficiency anemia due to chronic blood loss   . Myofascial pain   . Panic attack   . PONV (postoperative nausea and vomiting)   . Psoriatic arthritis ([place])   . Right inguinal hernia 08/16/2012  . Seizures ([place]) 1997   1 seizure from coil insertion but no more seizures and no meds for seizures  . Sjogren's disease ([place])   . [place] HEAD 02/03/2007   Qualifier: Diagnosis of  By: Aline Brochure MD, Dorothyann Peng    . Undifferentiated connective  tissue disease ([place])     Past Surgical History:  Procedure Laterality Date  . AGILE CAPSULE N/A 06/21/2014   Procedure: AGILE CAPSULE;  Surgeon: Danie Binder, MD;  [facility];  Service: Endoscopy;  Laterality: N/A;  0700  . BIOPSY  10/13/2011   SLF: mild gastritis/duodenal polypoid lesion   . CEREBRAL ANEURYSM REPAIR  1997  . COLONOSCOPY  10/13/2011   QQI:[id number] hemorrhoids/varices rectal/lesion in ascending colon(HYPERPLASTIC POLYP)  . COLONOSCOPY N/A 01/17/2013   SLF: 1. Internal hemorrhoids 2. Ascending colon polyps 3.  Rectal varices.   . COLONOSCOPY N/A 08/10/2016   Dr. Oneida Alar: redundant left colon. external and internal hemorhroids. 5-10 year surveillance  . ESOPHAGOGASTRODUODENOSCOPY N/A 06/29/2014   SLF: 1. anemia due to polypoid lesions in teh antrum, and duodenal polyps 2. single duodenum AVM  . FLEXIBLE SIGMOIDOSCOPY N/A 01/21/2016   Procedure: FLEXIBLE SIGMOIDOSCOPY;  Surgeon: Danie Binder, MD;  [facility];  Service: Endoscopy;  Laterality: N/A;  245  . GIVENS CAPSULE STUDY N/A 06/29/2014   Procedure: GIVENS CAPSULE STUDY;  Surgeon: Danie Binder, MD;  [facility];  Service: Endoscopy;  Laterality: N/A;  .  GIVENS CAPSULE STUDY N/A 07/13/2014   Procedure: GIVENS CAPSULE STUDY;  Surgeon: Danie Binder, MD;  [facility];  Service: Endoscopy;  Laterality: N/A;  700  . HEMORRHOID BANDING N/A 01/21/2016   Procedure: HEMORRHOID BANDING;  Surgeon: Danie Binder, MD;  [facility];  Service: Endoscopy;  Laterality: N/A;  . INGUINAL HERNIA REPAIR Right 08/28/2013   Procedure: RIGHT NGUINAL HERNIORRHAPHY WITH MESH;  Surgeon: Jamesetta So, MD;  Location: [facility];  Service: General;  Laterality: Right;  . INSERTION OF MESH N/A 06/09/2013   Procedure: INSERTION OF MESH;  Surgeon: Jamesetta So, MD;  Location: [facility];  Service: General;  Laterality: N/A;  . INSERTION OF MESH Right 08/28/2013   Procedure: INSERTION OF MESH;   Surgeon: Jamesetta So, MD;  Location: [facility];  Service: General;  Laterality: Right;  . PARACENTESIS  Feb 2014   10.7 liters  . TOOTH EXTRACTION Left 12/12/14  . UMBILICAL HERNIA REPAIR N/A 06/09/2013   Procedure: UMBILICAL HERNIORRHAPHY WITH MESH;  Surgeon: Jamesetta So, MD;  Location: [facility];  Service: General;  Laterality: N/A;    There were no vitals filed for this visit.  Subjective Assessment - 03/17/18 0851    Subjective  Pt reported she feels worse the closer she gets to the building (but this is nothing new). She still feels better overall but is concerned about the vertigo returning. When she lies on her back with head slightly tilted (to approx. 45 degrees) she feels like dizziness might began but improves with eyes open. Pt states she has improved and knows what to do if veritgo returns. Pt feels confident with HEP and denied LOB or staggering gait. Pt able to turn head quicly while amb.     Pertinent History  HTN, ETOH abuse, cirrhosis, brain aneurysm, Sjogren's disease, basilar aneurysm clip and coiling, Lt MCA aneurysm, potential TIA 10/2017-unable to perform MRI 2/2 aneurysm clipping    Diagnostic tests  CT scans x 2 (cannot do MRI becasue of the clips & coils);     Patient Stated Goals  Stop the dizziness     Currently in Pain?  No/denies                              Self Care: PT Education - 03/17/18 0902    Education provided  Yes    Education Details  PT discussed goals and d/c based on vertigo resolved per pt report. Pt did not wish to perform positional testing, as she wanted to "let things be" since she feels better.  PT discussed that new referral required if vertigo returns. PT encouraged pt to inform MD if she has s/s CVA based on hx. Pt feels confident with HEP and does not have questions.    Person(s) Educated  Patient    Methods  Explanation    Comprehension  Verbalized understanding       PT Short Term Goals - 02/17/18 1055      PT  SHORT TERM GOAL #1   Title  same as LTGs        PT Long Term Goals - 03/17/18 0905      PT LONG TERM GOAL #1   Title  Patient will be independent with HEP upon d/c from to improve dizziness, balance, and gait. TARGET DATE FOR ALL LTGS: 121/19/19    Status  Achieved      PT LONG  TERM GOAL #2   Title  Patient will have negative positional testing for BPPV to improve QOL and reduce dizziness during ADLs.     Status  Deferred      PT LONG TERM GOAL #3   Title  Perform FGA and write goal as indicated.     Status  Deferred      PT LONG TERM GOAL #4   Title  Pt will amb. 500' over uneven/even terrain while performing head turns, IND to ensure safety during functional mobility.     Status  Achieved            Plan - 03/17/18 0904    Clinical Impression Statement  Pt is discharged today based on progress and vertigo resolved. Pt met LTGs 1 and 4. LTG 2 and 3 deferred as pt did not wish to perform positional testing and FGA not indicated at this time. Please see d/c summary for details.     Rehab Potential  Good    Clinical Impairments Affecting Rehab Potential  anxiety, mixed peripheral and central vestibular deficits    PT Frequency  1x / week    PT Duration  4 weeks    PT Treatment/Interventions  ADLs/Self Care Home Management;Canalith Repostioning;Therapeutic activities;Therapeutic exercise;Balance training;Neuromuscular re-education;Cognitive remediation;Patient/family education;Passive range of motion;Vestibular;Visual/perceptual remediation/compensation;Biofeedback    PT Next Visit Plan  d/c    Consulted and Agree with Plan of Care  Patient       Patient will benefit from skilled therapeutic intervention in order to improve the following deficits and impairments:  Abnormal gait, Decreased activity tolerance, Decreased balance, Decreased cognition, Decreased mobility, Decreased knowledge of use of DME, Dizziness, Difficulty walking  Visit Diagnosis: Dizziness and  giddiness  BPPV (benign paroxysmal positional vertigo), bilateral  Other abnormalities of gait and mobility     Problem List Patient Active Problem List   Diagnosis Date Noted  . Benign paroxysmal positional vertigo   . 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 Essential hypertension 07/31/2017  . GERD (gastroesophageal reflux disease) 07/31/2017  . Alcohol abuse 07/31/2017  . Brain aneurysm 07/31/2017  . CKD (chronic kidney disease), stage III ([place]) 07/31/2017  . LBBB (left bundle branch block) 07/31/2017  . Dizziness 07/31/2017  . Folate deficiency 06/30/2016  . Constipation 04/01/2016  . 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 Hemorrhoids, internal, with bleeding 01/08/2016  . B12 deficiency 06/12/2015  . Psoriatic arthritis ([place]) 04/15/2015  . Nutritional anemia 09/19/2014  . Iron deficiency anemia due to chronic blood loss   . Insomnia 03/17/2014  . Depression 02/18/2014  . Labial swelling 08/23/2012  . Rash and nonspecific skin eruption 06/30/2012  . Colon polyps 11/18/2011  . Cirrhosis ([place]) 08/19/2011    Carley Glendenning L 03/17/2018, 9:05 AM  [facility] [address] [place], [place], [address] Phone: [phone]   Fax:  [phone]  Name: Deanna Thompson MRN: [id number] Date of Birth: [birth year]  PHYSICAL THERAPY DISCHARGE SUMMARY  Visits from Start of Care: 3  Current functional level related to goals / functional outcomes: PT Long Term Goals - 03/17/18 0905      PT LONG TERM GOAL #1   Title  Patient will be independent with HEP upon d/c from to improve dizziness, balance, and gait. TARGET DATE FOR ALL LTGS: 121/19/19    Status  Achieved      PT LONG TERM GOAL #2   Title  Patient will have negative positional testing for BPPV to improve QOL and reduce dizziness during ADLs.  Status  Deferred      PT LONG TERM GOAL #3   Title  Perform FGA and write goal as indicated.     Status  Deferred       PT LONG TERM GOAL #4   Title  Pt will amb. 500' over uneven/even terrain while performing head turns, IND to ensure safety during functional mobility.     Status  Achieved         Remaining deficits: None   Education / Equipment: HEP  Plan: Patient agrees to discharge.  Patient goals were not met. Patient is being discharged due to being pleased with the current functional level.  ?????          Geoffry Paradise, PT,DPT 03/17/18 9:07 AM Phone: [phone] Fax: [phone]

## 2018-03-21 ENCOUNTER — Other Ambulatory Visit: Payer: Self-pay | Admitting: Family Medicine

## 2018-03-21 NOTE — Telephone Encounter (Signed)
May have this +1 refill needs follow-up office visit 

## 2018-03-25 ENCOUNTER — Other Ambulatory Visit: Payer: Self-pay | Admitting: Gastroenterology

## 2018-04-08 ENCOUNTER — Other Ambulatory Visit (HOSPITAL_COMMUNITY): Payer: Self-pay | Admitting: Internal Medicine

## 2018-04-08 ENCOUNTER — Encounter (HOSPITAL_COMMUNITY): Payer: Self-pay | Admitting: Oncology

## 2018-04-08 ENCOUNTER — Inpatient Hospital Stay (HOSPITAL_COMMUNITY): Payer: Commercial Managed Care - PPO | Attending: Hematology

## 2018-04-08 DIAGNOSIS — Z87891 Personal history of nicotine dependence: Secondary | ICD-10-CM | POA: Diagnosis not present

## 2018-04-08 DIAGNOSIS — R0602 Shortness of breath: Secondary | ICD-10-CM | POA: Diagnosis not present

## 2018-04-08 DIAGNOSIS — Z79899 Other long term (current) drug therapy: Secondary | ICD-10-CM | POA: Diagnosis not present

## 2018-04-08 DIAGNOSIS — D500 Iron deficiency anemia secondary to blood loss (chronic): Secondary | ICD-10-CM

## 2018-04-08 DIAGNOSIS — Z7982 Long term (current) use of aspirin: Secondary | ICD-10-CM | POA: Diagnosis not present

## 2018-04-08 DIAGNOSIS — I129 Hypertensive chronic kidney disease with stage 1 through stage 4 chronic kidney disease, or unspecified chronic kidney disease: Secondary | ICD-10-CM | POA: Insufficient documentation

## 2018-04-08 DIAGNOSIS — K219 Gastro-esophageal reflux disease without esophagitis: Secondary | ICD-10-CM | POA: Diagnosis not present

## 2018-04-08 LAB — CBC WITH DIFFERENTIAL/PLATELET
ABS IMMATURE GRANULOCYTES: 0.02 10*3/uL (ref 0.00–0.07)
BASOS ABS: 0.1 10*3/uL (ref 0.0–0.1)
Basophils Relative: 1 %
EOS PCT: 4 %
Eosinophils Absolute: 0.2 10*3/uL (ref 0.0–0.5)
HCT: 27.6 % — ABNORMAL LOW (ref 36.0–46.0)
Hemoglobin: 7.5 g/dL — ABNORMAL LOW (ref 12.0–15.0)
Immature Granulocytes: 0 %
LYMPHS PCT: 17 %
Lymphs Abs: 1 10*3/uL (ref 0.7–4.0)
MCH: 18.2 pg — AB (ref 26.0–34.0)
MCHC: 27.2 g/dL — ABNORMAL LOW (ref 30.0–36.0)
MCV: 67 fL — ABNORMAL LOW (ref 80.0–100.0)
Monocytes Absolute: 0.5 10*3/uL (ref 0.1–1.0)
Monocytes Relative: 8 %
NEUTROS ABS: 4 10*3/uL (ref 1.7–7.7)
NEUTROS PCT: 70 %
PLATELETS: 271 10*3/uL (ref 150–400)
RBC: 4.12 MIL/uL (ref 3.87–5.11)
RDW: 19.2 % — ABNORMAL HIGH (ref 11.5–15.5)
WBC: 5.7 10*3/uL (ref 4.0–10.5)
nRBC: 0 % (ref 0.0–0.2)

## 2018-04-08 LAB — LACTATE DEHYDROGENASE: LDH: 152 U/L (ref 98–192)

## 2018-04-08 LAB — COMPREHENSIVE METABOLIC PANEL
ALT: 17 U/L (ref 0–44)
AST: 28 U/L (ref 15–41)
Albumin: 4.2 g/dL (ref 3.5–5.0)
Alkaline Phosphatase: 72 U/L (ref 38–126)
Anion gap: 9 (ref 5–15)
BUN: 14 mg/dL (ref 8–23)
CO2: 24 mmol/L (ref 22–32)
CREATININE: 1.1 mg/dL — AB (ref 0.44–1.00)
Calcium: 9.4 mg/dL (ref 8.9–10.3)
Chloride: 105 mmol/L (ref 98–111)
GFR, EST NON AFRICAN AMERICAN: 54 mL/min — AB (ref >60)
Glucose, Bld: 139 mg/dL — ABNORMAL HIGH (ref 70–99)
Potassium: 4.1 mmol/L (ref 3.5–5.1)
SODIUM: 138 mmol/L (ref 135–145)
Total Bilirubin: 0.6 mg/dL (ref 0.3–1.2)
Total Protein: 7.6 g/dL (ref 6.5–8.1)

## 2018-04-08 LAB — FERRITIN: FERRITIN: 2 ng/mL — AB (ref 11–307)

## 2018-04-12 ENCOUNTER — Inpatient Hospital Stay (HOSPITAL_BASED_OUTPATIENT_CLINIC_OR_DEPARTMENT_OTHER): Payer: Commercial Managed Care - PPO | Admitting: Internal Medicine

## 2018-04-12 ENCOUNTER — Ambulatory Visit (HOSPITAL_COMMUNITY): Payer: Commercial Managed Care - PPO | Admitting: Internal Medicine

## 2018-04-12 ENCOUNTER — Inpatient Hospital Stay (HOSPITAL_COMMUNITY): Payer: Commercial Managed Care - PPO

## 2018-04-12 ENCOUNTER — Ambulatory Visit (HOSPITAL_COMMUNITY): Payer: Commercial Managed Care - PPO

## 2018-04-12 ENCOUNTER — Encounter (HOSPITAL_COMMUNITY): Payer: Self-pay

## 2018-04-12 VITALS — BP 122/56 | HR 92 | Temp 98.8°F | Resp 18

## 2018-04-12 VITALS — BP 118/55 | HR 72 | Temp 99.0°F | Resp 18

## 2018-04-12 DIAGNOSIS — D500 Iron deficiency anemia secondary to blood loss (chronic): Secondary | ICD-10-CM | POA: Diagnosis not present

## 2018-04-12 DIAGNOSIS — R0602 Shortness of breath: Secondary | ICD-10-CM

## 2018-04-12 DIAGNOSIS — K219 Gastro-esophageal reflux disease without esophagitis: Secondary | ICD-10-CM | POA: Diagnosis not present

## 2018-04-12 DIAGNOSIS — I129 Hypertensive chronic kidney disease with stage 1 through stage 4 chronic kidney disease, or unspecified chronic kidney disease: Secondary | ICD-10-CM | POA: Diagnosis not present

## 2018-04-12 DIAGNOSIS — Z79899 Other long term (current) drug therapy: Secondary | ICD-10-CM

## 2018-04-12 DIAGNOSIS — Z7982 Long term (current) use of aspirin: Secondary | ICD-10-CM

## 2018-04-12 DIAGNOSIS — Z87891 Personal history of nicotine dependence: Secondary | ICD-10-CM

## 2018-04-12 MED ORDER — SODIUM CHLORIDE 0.9% FLUSH
10.0000 mL | Freq: Once | INTRAVENOUS | Status: AC | PRN
  Administered 2018-04-12: 10 mL

## 2018-04-12 MED ORDER — SODIUM CHLORIDE 0.9 % IV SOLN
510.0000 mg | Freq: Once | INTRAVENOUS | Status: AC
  Administered 2018-04-12: 510 mg via INTRAVENOUS
  Filled 2018-04-12: qty 17

## 2018-04-12 MED ORDER — SODIUM CHLORIDE 0.9 % IV SOLN
Freq: Once | INTRAVENOUS | Status: AC
Start: 2018-04-12 — End: 2018-04-12
  Administered 2018-04-12: 14:00:00 via INTRAVENOUS

## 2018-04-12 NOTE — Progress Notes (Signed)
Patient seen by Dr. [name] and ok to treat today with iron infusion.  Patient tolerated treatment with no complaints voiced.  Peripheral IV with good blood return before and after infusion.  Band aid applied.  VSS with discharge and left ambulatory with no s/s of distress noted.

## 2018-04-12 NOTE — Progress Notes (Signed)
Diagnosis Iron deficiency anemia due to chronic blood loss - Plan: CBC with Differential/Platelet, Comprehensive metabolic panel, Lactate dehydrogenase, Ferritin  Staging Cancer Staging No matching staging information was found for the patient.  Assessment and Plan:  1.  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  She underwent a flexible sigmoidoscopy with Dr. Oneida Alar on 01/21/2016 showing rectal bleeding due to proctitis and possibly internal hemorrhoids.    Due to cost of IV iron replacement therapy, she desired PO iron in an attempt to reduce/eliminate future need for IV iron replacement treatment.  Pt had also asked Dr. Oneida Alar to notify office in 06/2017 that she did not desire IV iron.    Pt had labs done 10/05/2017 that showed  WBC 7.8 HB 12.7 plts 237,000, ferritin decreased at 15.  At that time discussed with the patient Hb is adequate at 12.7, but ferritin is consistent with IDA.  She has option of ongoing oral iron and or MVI as she does not desire IV iron.  She denies any blood in stool or urine.    Labs done 04/08/2018 reviewed and showed WBC 5.7 HB 7.5 plts 271,000.  Chemistries WNl with K+ 4.1 Cr 1.1 and normal LFTs.  Ferritin 2.  I reiterated to pt she was offered IV iron in 09/2017 when HB was adequate at 12 but iron levels were low and she did not desire treatment.  Labs done 04/08/2018 have worsened and show HB 7.5 with ferritin of 2.  Pt has intolerance for oral iron.  She is recommended for IV iron with Feraheme 510 mg IV D1 and D8.  She will have repeat labs in 04/2018.  She is advised to follow-up with GI due to severity of anemia.  She should notify the office if she  has any problems prior to her next visit.  Pt expressed understanding of information presented.    2.  Cirrhosis.  Pt should continue to follow-up with GI as directed.  LFTS WNL on labs done 04/08/2018.    3.  HTN.  BP is 122/56.  Follow-up with PCP as directed.    4.  CVA.  BP is 122/56.  Follow-up with PCP or neurology as directed.    5.  SOB.  Pulse ox is 99% on room air.  I discussed with her likely etiology is due to severe anemia.  She has option of pulmonary referral if symptoms fail to improve after IV iron.    25 minutes spent with more than 50% spent in counseling and coordination of care.    Current Status:  Pt is seen today for follow-up.  She is here to go over labs.  She denies any blood in stool or urine.  She reports some SOB.    Problem List Patient Active Problem List   Diagnosis Date Noted  . Benign paroxysmal positional vertigo [H81.10]   . 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 Essential hypertension [I10] 07/31/2017  . GERD (gastroesophageal reflux disease) [K21.9] 07/31/2017  . Alcohol abuse [F10.10] 07/31/2017  . Brain aneurysm [I67.1] 07/31/2017  . CKD (chronic kidney disease), stage III ([place]) [N18.3] 07/31/2017  . LBBB (left bundle branch block) [I44.7] 07/31/2017  . Dizziness [R42] 07/31/2017  .  Folate deficiency [E53.8] 06/30/2016  . Constipation [K59.00] 04/01/2016  . 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 Hemorrhoids, internal, with bleeding [K64.8] 01/08/2016  . B12 deficiency [E53.8] 06/12/2015  . Psoriatic arthritis ([place]) [L40.50] 04/15/2015  . Nutritional anemia [D53.9] 09/19/2014  . Iron deficiency anemia due to chronic blood loss [D50.0]   . Insomnia [G47.00] 03/17/2014  . Depression [F32.9] 02/18/2014  . Labial swelling [N94.89] 08/23/2012  . Rash and nonspecific skin eruption [R21] 06/30/2012  . Colon polyps [K63.5] 11/18/2011  . Cirrhosis ([place]) [O03.55] 08/19/2011    Past Medical History Past Medical History:  Diagnosis Date  .  Abdominal hernia   . Abscess of pulp of tooth 12/12/14   left side  . Anxiety   . Arthritis    back  and down flank  right side more than left  . B12 deficiency 06/12/2015  . Brain aneurysm    METAL COIL PREVENTS MRI IMAGING and CLIP  . Cirrhosis ([place])    Presumably alcoholic cirrhosis  . Complication of anesthesia   . Depression   . ETOH abuse    Quit in 07/2011   . Fatigue    due to Sjogren's disease  . Folate deficiency 06/30/2016  . GERD (gastroesophageal reflux disease)   . HTN (hypertension)    no longer medicated, dated on 04/25/12  . IBS (irritable bowel syndrome)   . Iron deficiency anemia due to chronic blood loss   . Myofascial pain   . Panic attack   . PONV (postoperative nausea and vomiting)   . Psoriatic arthritis ([place])   . Right inguinal hernia 08/16/2012  . Seizures ([place]) 1997   1 seizure from coil insertion but no more seizures and no meds for seizures  . Sjogren's disease ([place])   . [place] HEAD 02/03/2007   Qualifier: Diagnosis of  By: Aline Brochure MD, Dorothyann Peng    . 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[facility])     Past Surgical History Past Surgical History:  Procedure Laterality Date  . AGILE CAPSULE N/A 06/21/2014   Procedure: AGILE CAPSULE;  Surgeon: Danie Binder, MD;  [facility];  Service: Endoscopy;  Laterality: N/A;  0700  . BIOPSY  10/13/2011   SLF: mild gastritis/duodenal polypoid lesion   . CEREBRAL ANEURYSM REPAIR  1997  . COLONOSCOPY  10/13/2011   HRC:BULAGTXM hemorrhoids/varices rectal/lesion in ascending colon(HYPERPLASTIC POLYP)  . COLONOSCOPY N/A 01/17/2013   SLF: 1. Internal hemorrhoids 2. Ascending colon polyps 3.  Rectal varices.   . COLONOSCOPY N/A 08/10/2016   Dr. Oneida Alar: redundant left colon. external and internal hemorhroids. 5-10 year surveillance  . ESOPHAGOGASTRODUODENOSCOPY N/A 06/29/2014   SLF: 1. anemia due to polypoid lesions in teh antrum, and duodenal polyps 2. single duodenum AVM  . FLEXIBLE SIGMOIDOSCOPY  N/A 01/21/2016   Procedure: FLEXIBLE SIGMOIDOSCOPY;  Surgeon: Danie Binder, MD;  [facility];  Service: Endoscopy;  Laterality: N/A;  245  . GIVENS CAPSULE STUDY N/A 06/29/2014   Procedure: GIVENS CAPSULE STUDY;  Surgeon: Danie Binder, MD;  [facility];  Service: Endoscopy;  Laterality: N/A;  . GIVENS CAPSULE STUDY N/A 07/13/2014   Procedure: GIVENS CAPSULE STUDY;  Surgeon: Danie Binder, MD;  [facility];  Service: Endoscopy;  Laterality: N/A;  700  . HEMORRHOID BANDING N/A 01/21/2016   Procedure: HEMORRHOID BANDING;  Surgeon: Danie Binder, MD;  [facility];  Service: Endoscopy;  Laterality: N/A;  . INGUINAL HERNIA REPAIR Right 08/28/2013   Procedure: RIGHT NGUINAL  HERNIORRHAPHY WITH MESH;  Surgeon: Jamesetta So, MD;  Location: [facility] ORS;  Service: General;  Laterality: Right;  . INSERTION OF MESH N/A 06/09/2013   Procedure: INSERTION OF MESH;  Surgeon: Jamesetta So, MD;  Location: [facility] ORS;  Service: General;  Laterality: N/A;  . INSERTION OF MESH Right 08/28/2013   Procedure: INSERTION OF MESH;  Surgeon: Jamesetta So, MD;  Location: [facility] ORS;  Service: General;  Laterality: Right;  . PARACENTESIS  Feb 2014   10.7 liters  . TOOTH EXTRACTION Left 12/12/14  . UMBILICAL HERNIA REPAIR N/A 06/09/2013   Procedure: UMBILICAL HERNIORRHAPHY WITH MESH;  Surgeon: Jamesetta So, MD;  Location: [facility] ORS;  Service: General;  Laterality: N/A;    Family History Family History  Problem Relation Age of Onset  . Cirrhosis Mother        nash  . COPD Father   . Heart disease Father   . Asthma Other   . Heart disease Other   . Colon cancer Neg Hx   . Inflammatory bowel disease Neg Hx      Social History  reports that she quit smoking about 23 years ago. Her smoking use included cigarettes. She has a 24.00 pack-year smoking history. She has never used smokeless tobacco. She reports that she does not drink alcohol or use drugs.  Medications  Current  Outpatient Medications:  .  aspirin 325 MG tablet, Take 325 mg by mouth daily., Disp: , Rfl:  .  atorvastatin (LIPITOR) 40 MG tablet, TAKE (1) TABLET BY MOUTH ONCE DAILY., Disp: 30 tablet, Rfl: 5 .  bumetanide (BUMEX) 1 MG tablet, TAKE (1) TABLET BY MOUTH (2) TIMES DAILY., Disp: 180 tablet, Rfl: 3 .  buPROPion (WELLBUTRIN SR) 100 MG 12 hr tablet, TAKE (1) TABLET BY MOUTH TWICE DAILY., Disp: 60 tablet, Rfl: 0 .  docusate sodium (COLACE) 100 MG capsule, Take 100 mg by mouth daily. , Disp: , Rfl:  .  escitalopram (LEXAPRO) 20 MG tablet, TAKE (1) TABLET BY MOUTH AT BEDTIME., Disp: 30 tablet, Rfl: 1 .  famotidine (PEPCID) 20 MG tablet, 1 PO BID MON-FRI, Disp: 60 tablet, Rfl: 11 .  fluticasone (FLONASE) 50 MCG/ACT nasal spray, Place 1 spray into both nostrils See admin instructions. Instill 1 spray into each nostril every morning, may also use later in the day as needed for sinus congestion, Disp: , Rfl:  .  hydrocortisone cream 1 %, Apply 1 application topically 2 (two) times daily as needed (eczema in ears)., Disp: , Rfl:  .  Lifitegrast (XIIDRA) 5 % SOLN, Place 1 drop into both eyes 2 (two) times daily., Disp: , Rfl:  .  LINZESS 145 MCG CAPS capsule, TAKE (1) CAPSULE BY MOUTH EVERY DAY BEFORE BREAKFAST., Disp: 90 capsule, Rfl: 3 .  loratadine (CLARITIN) 10 MG tablet, Take 10 mg by mouth daily., Disp: , Rfl:  .  losartan (COZAAR) 25 MG tablet, TAKE (1) TABLET BY MOUTH ONCE DAILY., Disp: 30 tablet, Rfl: 5 .  meclizine (ANTIVERT) 25 MG tablet, Take 1 tablet (25 mg total) by mouth 3 (three) times daily as needed for dizziness., Disp: 20 tablet, Rfl: 0 .  Multiple Vitamin (MULTIVITAMIN WITH MINERALS) TABS tablet, Take 1 tablet by mouth daily., Disp: , Rfl:  .  ondansetron (ZOFRAN) 4 MG tablet, Take 1 tablet (4 mg total) by mouth every 8 (eight) hours as needed for nausea or vomiting., Disp: 12 tablet, Rfl: 0 .  Polyvinyl Alcohol-Povidone (REFRESH OP), Place 1 drop into both eyes  3 (three) times daily as  needed (dry eyes)., Disp: , Rfl:  .  rifAXIMin (XIFAXAN PO), Take 11 mg by mouth daily., Disp: , Rfl:  .  Simethicone (GAS-X PO), Take 1 tablet by mouth 2 (two) times daily as needed (bloating/flatulence)., Disp: , Rfl:  .  sodium chloride (OCEAN) 0.65 % nasal spray, Place 1 spray into the nose 2 (two) times daily as needed for congestion. , Disp: , Rfl:  .  spironolactone (ALDACTONE) 100 MG tablet, TAKE (1) TABLET BY MOUTH TWICE DAILY. (Patient taking differently: Take 50 mg by mouth 2 (two) times daily. TAKE (1) TABLET BY MOUTH TWICE DAILY.), Disp: 180 tablet, Rfl: 5  Allergies Omeprazole; Orencia [abatacept]; Other; Phenytoin; Keflex [cephalexin]; and Phenergan [promethazine hcl]  Review of Systems Review of Systems - Oncology ROS negative other than SOB   Physical Exam  Vitals Wt Readings from Last 3 Encounters:  01/26/18 243 lb 3.2 oz (110.3 kg)  01/03/18 236 lb (107 kg)  12/22/17 235 lb 14.3 oz (107 kg)   Temp Readings from Last 3 Encounters:  04/12/18 98.8 F (37.1 C) (Oral)  04/12/18 99 F (37.2 C) (Oral)  01/26/18 98.7 F (37.1 C) (Oral)   BP Readings from Last 3 Encounters:  04/12/18 (!) 122/56  04/12/18 (!) 118/55  01/26/18 119/67   Pulse Readings from Last 3 Encounters:  04/12/18 92  04/12/18 72  01/26/18 80   Constitutional: Well-developed, well-nourished, and in no distress.   HENT: Head: Normocephalic and atraumatic.  Mouth/Throat: No oropharyngeal exudate. Mucosa moist. Eyes: Pupils are equal, round, and reactive to light. Conjunctivae are normal. No scleral icterus.  Neck: Normal range of motion. Neck supple. No JVD present.  Cardiovascular: Normal rate, regular rhythm and normal heart sounds.  Exam reveals no gallop and no friction rub.   No murmur heard. Pulmonary/Chest: Effort normal and breath sounds normal. No respiratory distress. No wheezes.No rales.  Abdominal: Soft. Bowel sounds are normal. No distension. There is no tenderness. There is no  guarding.  Musculoskeletal: No edema or tenderness.  Lymphadenopathy: No cervical, axillary or supraclavicular adenopathy.  Neurological: Alert and oriented to person, place, and time. No cranial nerve deficit.  Skin: Skin is warm and dry. No rash noted. No erythema. No pallor.  Psychiatric: Affect and judgment normal.   Labs No visits with results within 3 Day(s) from this visit.  Latest known visit with results is:  Appointment on 04/08/2018  Component Date Value Ref Range Status  . WBC 04/08/2018 5.7  4.0 - 10.5 K/uL Final  . RBC 04/08/2018 4.12  3.87 - 5.11 MIL/uL Final  . Hemoglobin 04/08/2018 7.5* 12.0 - 15.0 g/dL Final   Comment: Reticulocyte Hemoglobin testing may be clinically indicated, consider ordering this additional test [id number]   . HCT 04/08/2018 27.6* 36.0 - 46.0 % Final  . MCV 04/08/2018 67.0* 80.0 - 100.0 fL Final  . MCH 04/08/2018 18.2* 26.0 - 34.0 pg Final  . MCHC 04/08/2018 27.2* 30.0 - 36.0 g/dL Final  . RDW 04/08/2018 19.2* 11.5 - 15.5 % Final  . Platelets 04/08/2018 271  150 - 400 K/uL Final  . nRBC 04/08/2018 0.0  0.0 - 0.2 % Final  . Neutrophils Relative % 04/08/2018 70  % Final  . Neutro Abs 04/08/2018 4.0  1.7 - 7.7 K/uL Final  . Lymphocytes Relative 04/08/2018 17  % Final  . Lymphs Abs 04/08/2018 1.0  0.7 - 4.0 K/uL Final  . Monocytes Relative 04/08/2018 8  % Final  . Monocytes  Absolute 04/08/2018 0.5  0.1 - 1.0 K/uL Final  . Eosinophils Relative 04/08/2018 4  % Final  . Eosinophils Absolute 04/08/2018 0.2  0.0 - 0.5 K/uL Final  . Basophils Relative 04/08/2018 1  % Final  . Basophils Absolute 04/08/2018 0.1  0.0 - 0.1 K/uL Final  . RBC Morphology 04/08/2018 FEW OVALOCYTES AND SLIGHT MICROCYTOSIS SEEN   Final  . Immature Granulocytes 04/08/2018 0  % Final  . Abs Immature Granulocytes 04/08/2018 0.02  0.00 - 0.07 K/uL Final   Performed at [facility], [address] [place], [place] [address]  . Sodium 04/08/2018 138  135 - 145 mmol/L Final  .  Potassium 04/08/2018 4.1  3.5 - 5.1 mmol/L Final  . Chloride 04/08/2018 105  98 - 111 mmol/L Final  . CO2 04/08/2018 24  22 - 32 mmol/L Final  . Glucose, Bld 04/08/2018 139* 70 - 99 mg/dL Final  . BUN 04/08/2018 14  8 - 23 mg/dL Final  . Creatinine, Ser 04/08/2018 1.10* 0.44 - 1.00 mg/dL Final  . Calcium 04/08/2018 9.4  8.9 - 10.3 mg/dL Final  . Total Protein 04/08/2018 7.6  6.5 - 8.1 g/dL Final  . Albumin 04/08/2018 4.2  3.5 - 5.0 g/dL Final  . AST 04/08/2018 28  15 - 41 U/L Final  . ALT 04/08/2018 17  0 - 44 U/L Final  . Alkaline Phosphatase 04/08/2018 72  38 - 126 U/L Final  . Total Bilirubin 04/08/2018 0.6  0.3 - 1.2 mg/dL Final  . GFR calc non Af Amer 04/08/2018 54* >60 mL/min Final  . GFR calc Af Amer 04/08/2018 >60  >60 mL/min Final  . Anion gap 04/08/2018 9  5 - 15 Final   Performed at [facility], [address] [place], [place] [address]  . LDH 04/08/2018 152  98 - 192 U/L Final   Performed at [facility], [address], [place], [place] [address]  . Ferritin 04/08/2018 2* 11 - 307 ng/mL Final   Performed at [facility], [address] [place], [place] [address]     Pathology Orders Placed This Encounter  Procedures  . CBC with Differential/Platelet    Standing Status:   Future    Standing Expiration Date:   04/13/2019  . Comprehensive metabolic panel    Standing Status:   Future    Standing Expiration Date:   04/13/2019  . Lactate dehydrogenase    Standing Status:   Future    Standing Expiration Date:   04/13/2019  . Ferritin    Standing Status:   Future    Standing Expiration Date:   04/13/2019       Zoila Shutter MD

## 2018-04-12 NOTE — Patient Instructions (Signed)
[facility] at [facility]  Discharge Instructions:   _______________________________________________________________  Thank you for choosing [facility] at [facility] to provide your oncology and hematology care.  To afford each patient quality time with our providers, please arrive at least 15 minutes before your scheduled appointment.  You need to re-schedule your appointment if you arrive 10 or more minutes late.  We strive to give you quality time with our providers, and arriving late affects you and other patients whose appointments are after yours.  Also, if you no show three or more times for appointments you may be dismissed from the clinic.  Again, thank you for choosing [facility] at [facility]. Our hope is that these requests will allow you access to exceptional care and in a timely manner. _______________________________________________________________  If you have questions after your visit, please contact our office at [phone] between the hours of 8:30 a.m. and 5:00 p.m. Voicemails left after 4:30 p.m. will not be returned until the following business day. _______________________________________________________________  For prescription refill requests, have your pharmacy contact our office. _______________________________________________________________  Recommendations made by the consultant and any test results will be sent to your referring physician. _______________________________________________________________ 

## 2018-04-13 ENCOUNTER — Ambulatory Visit (HOSPITAL_COMMUNITY): Payer: Commercial Managed Care - PPO | Admitting: Internal Medicine

## 2018-04-13 ENCOUNTER — Ambulatory Visit (HOSPITAL_COMMUNITY): Payer: Commercial Managed Care - PPO

## 2018-04-14 ENCOUNTER — Ambulatory Visit: Payer: Commercial Managed Care - PPO | Admitting: Family Medicine

## 2018-04-15 ENCOUNTER — Ambulatory Visit (HOSPITAL_COMMUNITY): Payer: Commercial Managed Care - PPO | Admitting: Internal Medicine

## 2018-04-18 ENCOUNTER — Ambulatory Visit (HOSPITAL_COMMUNITY): Payer: Commercial Managed Care - PPO | Admitting: Internal Medicine

## 2018-04-19 ENCOUNTER — Encounter (HOSPITAL_COMMUNITY): Payer: Self-pay

## 2018-04-19 ENCOUNTER — Inpatient Hospital Stay (HOSPITAL_COMMUNITY): Payer: Commercial Managed Care - PPO

## 2018-04-19 ENCOUNTER — Other Ambulatory Visit: Payer: Self-pay

## 2018-04-19 VITALS — BP 118/46 | HR 61 | Temp 97.8°F | Resp 18

## 2018-04-19 DIAGNOSIS — D500 Iron deficiency anemia secondary to blood loss (chronic): Secondary | ICD-10-CM | POA: Diagnosis not present

## 2018-04-19 MED ORDER — SODIUM CHLORIDE 0.9 % IV SOLN
Freq: Once | INTRAVENOUS | Status: AC
  Administered 2018-04-19: 09:00:00 via INTRAVENOUS

## 2018-04-19 MED ORDER — SODIUM CHLORIDE 0.9% FLUSH
10.0000 mL | Freq: Once | INTRAVENOUS | Status: AC | PRN
  Administered 2018-04-19: 10 mL

## 2018-04-19 MED ORDER — SODIUM CHLORIDE 0.9 % IV SOLN
510.0000 mg | Freq: Once | INTRAVENOUS | Status: AC
  Administered 2018-04-19: 510 mg via INTRAVENOUS
  Filled 2018-04-19: qty 17

## 2018-04-19 NOTE — Patient Instructions (Signed)
[facility] at [facility]  Discharge Instructions:   _______________________________________________________________  Thank you for choosing [facility] at [facility] to provide your oncology and hematology care.  To afford each patient quality time with our providers, please arrive at least 15 minutes before your scheduled appointment.  You need to re-schedule your appointment if you arrive 10 or more minutes late.  We strive to give you quality time with our providers, and arriving late affects you and other patients whose appointments are after yours.  Also, if you no show three or more times for appointments you may be dismissed from the clinic.  Again, thank you for choosing [facility] at [facility]. Our hope is that these requests will allow you access to exceptional care and in a timely manner. _______________________________________________________________  If you have questions after your visit, please contact our office at [phone] between the hours of 8:30 a.m. and 5:00 p.m. Voicemails left after 4:30 p.m. will not be returned until the following business day. _______________________________________________________________  For prescription refill requests, have your pharmacy contact our office. _______________________________________________________________  Recommendations made by the consultant and any test results will be sent to your referring physician. _______________________________________________________________ 

## 2018-04-19 NOTE — Progress Notes (Signed)
Pt presents today for Iron infusion. VSS. MAR reviewed and updated with patient. No complaints of any changes since the last visit.   Feraheme given today per MD orders. Tolerated infusion without adverse affects. Vital signs stable. No complaints at this time. Discharged from clinic ambulatory. F/U with [facility] as scheduled.

## 2018-04-25 ENCOUNTER — Ambulatory Visit (INDEPENDENT_AMBULATORY_CARE_PROVIDER_SITE_OTHER): Payer: Commercial Managed Care - PPO | Admitting: Family Medicine

## 2018-04-25 VITALS — BP 118/78 | Ht 68.0 in | Wt 247.6 lb

## 2018-04-25 DIAGNOSIS — N183 Chronic kidney disease, stage 3 unspecified: Secondary | ICD-10-CM

## 2018-04-25 DIAGNOSIS — F321 Major depressive disorder, single episode, moderate: Secondary | ICD-10-CM | POA: Diagnosis not present

## 2018-04-25 DIAGNOSIS — I10 Essential (primary) hypertension: Secondary | ICD-10-CM | POA: Diagnosis not present

## 2018-04-25 DIAGNOSIS — F5101 Primary insomnia: Secondary | ICD-10-CM

## 2018-04-25 MED ORDER — ESCITALOPRAM OXALATE 20 MG PO TABS: ORAL_TABLET | ORAL | 5 refills | Status: DC

## 2018-04-25 MED ORDER — LOSARTAN POTASSIUM 25 MG PO TABS: ORAL_TABLET | ORAL | 5 refills | Status: DC

## 2018-04-25 MED ORDER — BUPROPION HCL ER (SR) 100 MG PO TB12: ORAL_TABLET | ORAL | 5 refills | Status: DC

## 2018-04-25 NOTE — Progress Notes (Signed)
   Subjective:    Patient ID: Deanna Thompson, female    DOB: 1955-08-18, 63 y.o.   MRN: [id number]  HPI  Patient arrives for a follow up on depression.  Patient has muscle spasms in lower back. Patient has been doing stretches and TENS units  Patient states she has noticed she has been sleeping more since her stroke- also has has 2 iron infusions for anemia.  Two infusion the pat tow weeks for sob and severer anemeia  Sob is better  depr is better  Patient notes ongoing compliance with antidepressant medication. No obvious side effects. Reports does not miss a dose. Overall continues to help depression substantially. No thoughts of homicide or suicide. Would like to maintain medication. Has ongoing couselling, had been more dperessed lately but now better   Blood pressure medicine and blood pressure levels reviewed today with patient. Compliant with blood pressure medicine. States does not miss a dose. No obvious side effects. Blood pressure generally good when checked elsewhere. Watching salt intake.  Potential diminshed chronic absorption, has had multi gi workups in the past with no evidence of bleeding   Not exercising much ethese days,  More fatigue than usua    achey in the hips   Review of Systems No headache, no major weight loss or weight gain, no chest pain no back pain abdominal pain no change in bowel habits complete ROS otherwise negative     Objective:   Physical Exam   Alert and oriented, vitals reviewed and stable, NAD ENT-TM's and ext canals WNL bilat via otoscopic exam Soft palate, tonsils and post pharynx WNL via oropharyngeal exam Neck-symmetric, no masses; thyroid nonpalpable and nontender Pulmonary-no tachypnea or accessory muscle use; Clear without wheezes via auscultation Card--no abnrml murmurs, rhythm reg and rate WNL Carotid pulses symmetric, without bruits      Assessment & Plan:  Impression depression with some anxiety.  Clinically  stable.  Chronic in nature.  Will maintain same meds  2.  Hypertension.  Good control discussed blood pressure.  Good control  3.  Hyperlipidemia.  Patient came off the Lipitor.  Not exactly sure why.  This was in consultation with Dr. Oneida Alar.  Patient's liver enzymes have remained stable.  She does have cirrhosis.  But also complicated by recent stroke last year with goal LDL now below 70.  4.  Status post stroke rationale for 1 tight control #2  5.  Anemia.  Followed by specialist for this.  Iron deficiency in nature with no clear etiology.  Longstanding.  Could be absorption.  Medications refilled diet exercise discussed.

## 2018-05-06 LAB — LIPID PANEL
Chol/HDL Ratio: 3.2 ratio (ref 0.0–4.4)
Cholesterol, Total: 239 mg/dL — ABNORMAL HIGH (ref 100–199)
HDL: 74 mg/dL (ref >39)
LDL Calculated: 134 mg/dL — ABNORMAL HIGH (ref 0–99)
Triglycerides: 153 mg/dL — ABNORMAL HIGH (ref 0–149)
VLDL Cholesterol Cal: 31 mg/dL (ref 5–40)

## 2018-05-08 ENCOUNTER — Encounter: Payer: Self-pay | Admitting: Family Medicine

## 2018-05-09 ENCOUNTER — Telehealth: Payer: Self-pay | Admitting: Family Medicine

## 2018-05-09 NOTE — Telephone Encounter (Signed)
Pt has some questions regarding her cholesterol results and what she should be doing now to help her cholesterol.   CB# [phone]

## 2018-05-09 NOTE — Telephone Encounter (Signed)
Please advise. Results are in but have not been resulted. Thank you

## 2018-05-10 ENCOUNTER — Other Ambulatory Visit: Payer: Self-pay | Admitting: Family Medicine

## 2018-05-10 NOTE — Telephone Encounter (Signed)
Left message to return call 

## 2018-05-10 NOTE — Telephone Encounter (Signed)
Letter was sent out yesterday, she has now a very high protective hdl in the seventies which outweighs the mild elevation in ldl, her lipid panel as I noted in the letter is substantially better than the average pt because of the high hdl, she can look up details on a low chol diet in the web md website

## 2018-05-11 ENCOUNTER — Other Ambulatory Visit: Payer: Self-pay | Admitting: Family Medicine

## 2018-05-11 DIAGNOSIS — I10 Essential (primary) hypertension: Secondary | ICD-10-CM

## 2018-05-11 NOTE — Telephone Encounter (Signed)
Contacted patient. Pt states she understood everything in the letter and understood what I was telling her. Pt states she was not sure if she told provider that she had a stroke back in May 2019 and the neurologist wanted her total cholesterol to be under 120 if possible and her bad cholesterol under 70 if possible. Pt states at the time she did her blood work she was not on any medication for cholesterol. Please advise. Thank you

## 2018-05-11 NOTE — Telephone Encounter (Signed)
lipitor 40 dfaily listed on chart, is pt taking? if not, when did she last take faithfully?  If taking faithfully up thru now would increase the dose to 80, if off would recommend resuming and get anothr level in 2  To 3 months

## 2018-05-11 NOTE — Telephone Encounter (Signed)
Pt is not currently on Lipitor. Pt states that she last took Lipitor faithfully about 3 months ago. Pt informed to resume Lipitor 40 mg daily and have blood work repeated in 2-3 months. Lab orders placed and pt verbalized understanding.

## 2018-05-16 ENCOUNTER — Other Ambulatory Visit (HOSPITAL_COMMUNITY): Payer: Self-pay | Admitting: Obstetrics & Gynecology

## 2018-05-16 DIAGNOSIS — Z1231 Encounter for screening mammogram for malignant neoplasm of breast: Secondary | ICD-10-CM

## 2018-05-18 ENCOUNTER — Ambulatory Visit (HOSPITAL_COMMUNITY)
Admission: RE | Admit: 2018-05-18 | Discharge: 2018-05-18 | Disposition: A | Discharge status: Home / Self Care | Payer: Commercial Managed Care - PPO | Source: Ambulatory Visit | Attending: Obstetrics & Gynecology | Admitting: Obstetrics & Gynecology

## 2018-05-18 ENCOUNTER — Telehealth: Payer: Self-pay

## 2018-05-18 DIAGNOSIS — K7030 Alcoholic cirrhosis of liver without ascites: Secondary | ICD-10-CM

## 2018-05-18 DIAGNOSIS — Z1231 Encounter for screening mammogram for malignant neoplasm of breast: Secondary | ICD-10-CM | POA: Diagnosis present

## 2018-05-18 NOTE — Telephone Encounter (Signed)
PLEASE CALL PT. HER STOOL CHANGE IS MOST LIKELY DUE TO HER RECENT VIRAL ILLNESS. SHE GETS HER LIVER IMAGED EVERY 6 MOS. THE LIKELIHOOD SHE HAS LIVER OR BILE DUCT CANCER IS LOW. SHE SHE NEEDS TO COMPLETE HER SCREENING U/S. IT WAS DUE DEC 2019.

## 2018-05-18 NOTE — Telephone Encounter (Signed)
LMOM to call.

## 2018-05-18 NOTE — Telephone Encounter (Signed)
PT is aware and will call Radiology to schedule the the [place].

## 2018-05-18 NOTE — Telephone Encounter (Signed)
Pt said she has had some weird BM's recently. Last week she had a stomach virus and had diarrhea a few days.  But recently she has had a change in color and shape of BM's.  Some are blonde in color ( not white) and shaped like straws. She takes the colace and Linzess regularly. She read on internet that straw shaped stools could indicate cancer. She would like for Dr. Oneida Alar to advise on this.

## 2018-05-20 ENCOUNTER — Inpatient Hospital Stay (HOSPITAL_COMMUNITY): Payer: Commercial Managed Care - PPO | Attending: Hematology

## 2018-05-20 DIAGNOSIS — Z8249 Family history of ischemic heart disease and other diseases of the circulatory system: Secondary | ICD-10-CM | POA: Insufficient documentation

## 2018-05-20 DIAGNOSIS — D500 Iron deficiency anemia secondary to blood loss (chronic): Secondary | ICD-10-CM

## 2018-05-20 DIAGNOSIS — I129 Hypertensive chronic kidney disease with stage 1 through stage 4 chronic kidney disease, or unspecified chronic kidney disease: Secondary | ICD-10-CM | POA: Diagnosis not present

## 2018-05-20 DIAGNOSIS — Z79899 Other long term (current) drug therapy: Secondary | ICD-10-CM | POA: Insufficient documentation

## 2018-05-20 DIAGNOSIS — Z7982 Long term (current) use of aspirin: Secondary | ICD-10-CM | POA: Insufficient documentation

## 2018-05-20 DIAGNOSIS — N183 Chronic kidney disease, stage 3 (moderate): Secondary | ICD-10-CM | POA: Diagnosis not present

## 2018-05-20 DIAGNOSIS — Z87891 Personal history of nicotine dependence: Secondary | ICD-10-CM | POA: Diagnosis not present

## 2018-05-20 DIAGNOSIS — K219 Gastro-esophageal reflux disease without esophagitis: Secondary | ICD-10-CM | POA: Diagnosis not present

## 2018-05-20 DIAGNOSIS — R0602 Shortness of breath: Secondary | ICD-10-CM | POA: Diagnosis not present

## 2018-05-20 LAB — COMPREHENSIVE METABOLIC PANEL
ALBUMIN: 4.4 g/dL (ref 3.5–5.0)
ALT: 26 U/L (ref 0–44)
AST: 29 U/L (ref 15–41)
Alkaline Phosphatase: 59 U/L (ref 38–126)
Anion gap: 11 (ref 5–15)
BUN: 21 mg/dL (ref 8–23)
CO2: 26 mmol/L (ref 22–32)
Calcium: 9.5 mg/dL (ref 8.9–10.3)
Chloride: 104 mmol/L (ref 98–111)
Creatinine, Ser: 1.3 mg/dL — ABNORMAL HIGH (ref 0.44–1.00)
GFR calc Af Amer: 51 mL/min — ABNORMAL LOW (ref >60)
GFR calc non Af Amer: 44 mL/min — ABNORMAL LOW (ref >60)
Glucose, Bld: 119 mg/dL — ABNORMAL HIGH (ref 70–99)
Potassium: 3.8 mmol/L (ref 3.5–5.1)
Sodium: 141 mmol/L (ref 135–145)
Total Bilirubin: 0.6 mg/dL (ref 0.3–1.2)
Total Protein: 7.6 g/dL (ref 6.5–8.1)

## 2018-05-20 LAB — CBC WITH DIFFERENTIAL/PLATELET
Abs Immature Granulocytes: 0.03 10*3/uL (ref 0.00–0.07)
Basophils Absolute: 0.1 10*3/uL (ref 0.0–0.1)
Basophils Relative: 1 %
Eosinophils Absolute: 0.2 10*3/uL (ref 0.0–0.5)
Eosinophils Relative: 3 %
HCT: 38.2 % (ref 36.0–46.0)
Hemoglobin: 11.1 g/dL — ABNORMAL LOW (ref 12.0–15.0)
Immature Granulocytes: 0 %
Lymphocytes Relative: 15 %
Lymphs Abs: 1.1 10*3/uL (ref 0.7–4.0)
MCH: 23.8 pg — ABNORMAL LOW (ref 26.0–34.0)
MCHC: 29.1 g/dL — ABNORMAL LOW (ref 30.0–36.0)
MCV: 82 fL (ref 80.0–100.0)
Monocytes Absolute: 0.5 10*3/uL (ref 0.1–1.0)
Monocytes Relative: 6 %
NEUTROS PCT: 75 %
Neutro Abs: 5.3 10*3/uL (ref 1.7–7.7)
Platelets: 260 10*3/uL (ref 150–400)
RBC: 4.66 MIL/uL (ref 3.87–5.11)
WBC: 7.1 10*3/uL (ref 4.0–10.5)
nRBC: 0 % (ref 0.0–0.2)

## 2018-05-20 LAB — LACTATE DEHYDROGENASE: LDH: 158 U/L (ref 98–192)

## 2018-05-20 LAB — FERRITIN: Ferritin: 28 ng/mL (ref 11–307)

## 2018-05-23 ENCOUNTER — Inpatient Hospital Stay (HOSPITAL_BASED_OUTPATIENT_CLINIC_OR_DEPARTMENT_OTHER): Payer: Commercial Managed Care - PPO | Admitting: Internal Medicine

## 2018-05-23 ENCOUNTER — Other Ambulatory Visit: Payer: Self-pay

## 2018-05-23 ENCOUNTER — Encounter (HOSPITAL_COMMUNITY): Payer: Self-pay | Admitting: Internal Medicine

## 2018-05-23 DIAGNOSIS — Z87891 Personal history of nicotine dependence: Secondary | ICD-10-CM

## 2018-05-23 DIAGNOSIS — D500 Iron deficiency anemia secondary to blood loss (chronic): Secondary | ICD-10-CM

## 2018-05-23 DIAGNOSIS — I129 Hypertensive chronic kidney disease with stage 1 through stage 4 chronic kidney disease, or unspecified chronic kidney disease: Secondary | ICD-10-CM

## 2018-05-23 DIAGNOSIS — R0602 Shortness of breath: Secondary | ICD-10-CM | POA: Diagnosis not present

## 2018-05-23 DIAGNOSIS — Z79899 Other long term (current) drug therapy: Secondary | ICD-10-CM

## 2018-05-23 DIAGNOSIS — Z7982 Long term (current) use of aspirin: Secondary | ICD-10-CM

## 2018-05-23 DIAGNOSIS — N183 Chronic kidney disease, stage 3 (moderate): Secondary | ICD-10-CM | POA: Diagnosis not present

## 2018-05-23 DIAGNOSIS — Z8249 Family history of ischemic heart disease and other diseases of the circulatory system: Secondary | ICD-10-CM

## 2018-05-23 DIAGNOSIS — K219 Gastro-esophageal reflux disease without esophagitis: Secondary | ICD-10-CM

## 2018-05-23 NOTE — Progress Notes (Signed)
Diagnosis Iron deficiency anemia due to chronic blood loss - Plan: CBC with Differential, Comprehensive metabolic panel, Lactate dehydrogenase, Ferritin  Staging Cancer Staging No matching staging information was found for the patient.  Assessment and Plan:   1.  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  She underwent a flexible sigmoidoscopy with Dr. Oneida Alar on 01/21/2016 showing rectal bleeding due to proctitis and possibly internal hemorrhoids.    Due to cost of IV iron replacement therapy, she desired PO iron in an attempt to reduce/eliminate future need for IV iron replacement treatment.  Pt had also asked Dr. Oneida Alar to notify office in 06/2017 that she did not desire IV iron.    Pt had labs done 10/05/2017 that showed  WBC 7.8 HB 12.7 plts 237,000, ferritin decreased at 15.  At that time discussed with the patient Hb is adequate at 12.7, but ferritin is consistent with IDA.  She has option of ongoing oral iron and or MVI as she does not desire IV iron.  She denies any blood in stool or urine.    Labs done 04/08/2018 reviewed and showed WBC 5.7 HB 7.5 plts 271,000.  Chemistries WNl with K+ 4.1 Cr 1.1 and normal LFTs.  Ferritin 2.  I reiterated to pt she was offered IV iron in 09/2017 when HB was adequate at 12 but iron levels were low and she did not desire treatment.    Pt agreed to IV iron and was treated 03/2018.   Labs done 05/20/2018 reviewed and showed WBC 7.1 HB 11.1 plts 260,000.  Chemistries WNL with K+ 3.8 Cr 1.3 normal LFTs.  Ferritin 28.  Pt has minimal improvement in ferritin.  Will repeat labs in 07/2018 to determine if iron levels continue to improve.  She is advised to  follow-up with GI due to severity of anemia.  She should notify the office if she has any problems prior to her next visit.   2.  Cirrhosis.  Pt should continue to follow-up with GI as directed.  LFTS WNL on labs done 05/20/2018.    3.  HTN.  BP is 118/78.  Follow-up with PCP as directed.    4.  CVA.  Follow-up with PCP or neurology as directed.    5.  SOB.  Pt reports this improved after IV iron.  Pulse ox is 100% on room air.  She has option of pulmonary referral if symptoms recur.    Current Status:  Pt is seen today for follow-up.  She is here to go over labs.  She denies any blood in stool or urine.  She reports SOB improved.    Problem List Patient Active Problem List   Diagnosis Date Noted  . Benign paroxysmal positional vertigo [H81.10]   . 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 Essential hypertension [I10] 07/31/2017  . GERD (gastroesophageal reflux disease) [K21.9] 07/31/2017  . Alcohol abuse [F10.10] 07/31/2017  . Brain aneurysm [I67.1] 07/31/2017  . CKD (chronic kidney disease), stage III ([place]) [N18.3] 07/31/2017  . LBBB (left bundle branch block) [I44.7] 07/31/2017  . Dizziness [R42] 07/31/2017  . Folate deficiency [E53.8] 06/30/2016  . Constipation [K59.00] 04/01/2016  . 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 Hemorrhoids, internal, with  bleeding [K64.8] 01/08/2016  . B12 deficiency [E53.8] 06/12/2015  . Psoriatic arthritis ([place]) [L40.50] 04/15/2015  . Nutritional anemia [D53.9] 09/19/2014  . Iron deficiency anemia due to chronic blood loss [D50.0]   . Insomnia [G47.00] 03/17/2014  . Depression [F32.9] 02/18/2014  . Labial swelling [N94.89] 08/23/2012  . Rash and nonspecific skin eruption [R21] 06/30/2012  . Colon polyps [K63.5] 11/18/2011  . Cirrhosis ([place]) [N56.21] 08/19/2011    Past Medical History Past Medical History:  Diagnosis Date  . Abdominal hernia   . Abscess of pulp of tooth 12/12/14   left side  . Anxiety   . Arthritis    back  and down  flank  right side more than left  . B12 deficiency 06/12/2015  . Brain aneurysm    METAL COIL PREVENTS MRI IMAGING and CLIP  . Cirrhosis ([place])    Presumably alcoholic cirrhosis  . Complication of anesthesia   . Depression   . ETOH abuse    Quit in 07/2011   . Fatigue    due to Sjogren's disease  . Folate deficiency 06/30/2016  . GERD (gastroesophageal reflux disease)   . HTN (hypertension)    no longer medicated, dated on 04/25/12  . IBS (irritable bowel syndrome)   . Iron deficiency anemia due to chronic blood loss   . Myofascial pain   . Panic attack   . PONV (postoperative nausea and vomiting)   . Psoriatic arthritis ([place])   . Right inguinal hernia 08/16/2012  . Seizures ([place]) 1997   1 seizure from coil insertion but no more seizures and no meds for seizures  . Sjogren's disease ([place])   . [place] HEAD 02/03/2007   Qualifier: Diagnosis of  By: Aline Brochure MD, Dorothyann Peng    . 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[facility])     Past Surgical History Past Surgical History:  Procedure Laterality Date  . AGILE CAPSULE N/A 06/21/2014   Procedure: AGILE CAPSULE;  Surgeon: Danie Binder, MD;  [facility];  Service: Endoscopy;  Laterality: N/A;  0700  . BIOPSY  10/13/2011   SLF: mild gastritis/duodenal polypoid lesion   . CEREBRAL ANEURYSM REPAIR  1997  . COLONOSCOPY  10/13/2011   HYQ:MVHQIONG hemorrhoids/varices rectal/lesion in ascending colon(HYPERPLASTIC POLYP)  . COLONOSCOPY N/A 01/17/2013   SLF: 1. Internal hemorrhoids 2. Ascending colon polyps 3.  Rectal varices.   . COLONOSCOPY N/A 08/10/2016   Dr. Oneida Alar: redundant left colon. external and internal hemorhroids. 5-10 year surveillance  . ESOPHAGOGASTRODUODENOSCOPY N/A 06/29/2014   SLF: 1. anemia due to polypoid lesions in teh antrum, and duodenal polyps 2. single duodenum AVM  . FLEXIBLE SIGMOIDOSCOPY N/A 01/21/2016   Procedure: FLEXIBLE SIGMOIDOSCOPY;  Surgeon: Danie Binder, MD;  [facility];   Service: Endoscopy;  Laterality: N/A;  245  . GIVENS CAPSULE STUDY N/A 06/29/2014   Procedure: GIVENS CAPSULE STUDY;  Surgeon: Danie Binder, MD;  [facility];  Service: Endoscopy;  Laterality: N/A;  . GIVENS CAPSULE STUDY N/A 07/13/2014   Procedure: GIVENS CAPSULE STUDY;  Surgeon: Danie Binder, MD;  [facility];  Service: Endoscopy;  Laterality: N/A;  700  . HEMORRHOID BANDING N/A 01/21/2016   Procedure: HEMORRHOID BANDING;  Surgeon: Danie Binder, MD;  [facility];  Service: Endoscopy;  Laterality: N/A;  . INGUINAL HERNIA REPAIR Right 08/28/2013   Procedure: RIGHT NGUINAL HERNIORRHAPHY WITH MESH;  Surgeon: Jamesetta So, MD;  Location: [facility] ORS;  Service: General;  Laterality: Right;  .  INSERTION OF MESH N/A 06/09/2013   Procedure: INSERTION OF MESH;  Surgeon: Jamesetta So, MD;  Location: [facility] OR[facility];  Service: General;  Laterality: N/A;  . INSERTION OF MESH Right 08/28/2013   Procedure: INSERTION OF MESH;  Surgeon: Jamesetta So, MD;  Location: [facility] OR[facility];  Service: General;  Laterality: Right;  . PARACENTESIS  Feb 2014   10.7 liters  . TOOTH EXTRACTION Left 12/12/14  . UMBILICAL HERNIA REPAIR N/A 06/09/2013   Procedure: UMBILICAL HERNIORRHAPHY WITH MESH;  Surgeon: Jamesetta So, MD;  Location: [facility] OR[facility];  Service: General;  Laterality: N/A;    Family History Family History  Problem Relation Age of Onset  . Cirrhosis Mother        nash  . COPD Father   . Heart disease Father   . Asthma Other   . Heart disease Other   . Colon cancer Neg Hx   . Inflammatory bowel disease Neg Hx      Social History  reports that she quit smoking about 23 years ago. Her smoking use included cigarettes. She has a 24.00 pack-year smoking history. She has never used smokeless tobacco. She reports that she does not drink alcohol or use drugs.  Medications  Current Outpatient Medications:  .  aspirin 325 MG tablet, Take 325 mg by mouth daily., Disp: , Rfl:  .  atorvastatin  (LIPITOR) 40 MG tablet, TAKE (1) TABLET BY MOUTH ONCE DAILY., Disp: 30 tablet, Rfl: 5 .  bumetanide (BUMEX) 1 MG tablet, TAKE (1) TABLET BY MOUTH (2) TIMES DAILY., Disp: 180 tablet, Rfl: 3 .  buPROPion (WELLBUTRIN SR) 100 MG 12 hr tablet, TAKE (1) TABLET BY MOUTH TWICE DAILY., Disp: 60 tablet, Rfl: 5 .  docusate sodium (COLACE) 100 MG capsule, Take 100 mg by mouth daily. , Disp: , Rfl:  .  escitalopram (LEXAPRO) 20 MG tablet, TAKE (1) TABLET BY MOUTH AT BEDTIME., Disp: 30 tablet, Rfl: 5 .  famotidine (PEPCID) 20 MG tablet, 1 PO BID MON-FRI, Disp: 60 tablet, Rfl: 11 .  fluticasone (FLONASE) 50 MCG/ACT nasal spray, Place 1 spray into both nostrils See admin instructions. Instill 1 spray into each nostril every morning, may also use later in the day as needed for sinus congestion, Disp: , Rfl:  .  Lifitegrast (XIIDRA) 5 % SOLN, Place 1 drop into both eyes 2 (two) times daily., Disp: , Rfl:  .  LINZESS 145 MCG CAPS capsule, TAKE (1) CAPSULE BY MOUTH EVERY DAY BEFORE BREAKFAST., Disp: 90 capsule, Rfl: 3 .  loratadine (CLARITIN) 10 MG tablet, Take 10 mg by mouth daily., Disp: , Rfl:  .  losartan (COZAAR) 25 MG tablet, TAKE (1) TABLET BY MOUTH ONCE DAILY., Disp: 30 tablet, Rfl: 5 .  Multiple Vitamin (MULTIVITAMIN WITH MINERALS) TABS tablet, Take 1 tablet by mouth daily., Disp: , Rfl:  .  Polyvinyl Alcohol-Povidone (REFRESH OP), Place 1 drop into both eyes 3 (three) times daily as needed (dry eyes)., Disp: , Rfl:  .  Simethicone (GAS-X PO), Take 1 tablet by mouth 2 (two) times daily as needed (bloating/flatulence)., Disp: , Rfl:  .  sodium chloride (OCEAN) 0.65 % nasal spray, Place 1 spray into the nose 2 (two) times daily as needed for congestion. , Disp: , Rfl:  .  spironolactone (ALDACTONE) 100 MG tablet, TAKE (1) TABLET BY MOUTH TWICE DAILY. (Patient taking differently: Take 50 mg by mouth 2 (two) times daily. TAKE (1) TABLET BY MOUTH TWICE DAILY.), Disp: 180 tablet, Rfl: 5 .  Tofacitinib  Citrate ER  (XELJANZ XR) 11 MG TB24, Take by mouth. 1/2 tab daily, Disp: , Rfl:  .  hydrocortisone cream 1 %, Apply 1 application topically 2 (two) times daily as needed (eczema in ears)., Disp: , Rfl:  .  meclizine (ANTIVERT) 25 MG tablet, Take 1 tablet (25 mg total) by mouth 3 (three) times daily as needed for dizziness. (Patient not taking: Reported on 05/23/2018), Disp: 20 tablet, Rfl: 0 .  ondansetron (ZOFRAN) 4 MG tablet, Take 1 tablet (4 mg total) by mouth every 8 (eight) hours as needed for nausea or vomiting. (Patient not taking: Reported on 05/23/2018), Disp: 12 tablet, Rfl: 0  Allergies Omeprazole; Orencia [abatacept]; Other; Phenytoin; Keflex [cephalexin]; and Phenergan [promethazine hcl]  Review of Systems Review of Systems - Oncology ROS negative   Physical Exam  Vitals Wt Readings from Last 3 Encounters:  04/25/18 247 lb 9.6 oz (112.3 kg)  01/26/18 243 lb 3.2 oz (110.3 kg)  01/03/18 236 lb (107 kg)   Temp Readings from Last 3 Encounters:  04/19/18 97.8 F (36.6 C) (Oral)  04/12/18 98.8 F (37.1 C) (Oral)  04/12/18 99 F (37.2 C) (Oral)   BP Readings from Last 3 Encounters:  04/25/18 118/78  04/19/18 (!) 118/46  04/12/18 (!) 122/56   Pulse Readings from Last 3 Encounters:  04/19/18 61  04/12/18 92  04/12/18 72   Constitutional: Well-developed, well-nourished, and in no distress.   HENT: Head: Normocephalic and atraumatic.  Mouth/Throat: No oropharyngeal exudate. Mucosa moist. Eyes: Pupils are equal, round, and reactive to light. Conjunctivae are normal. No scleral icterus.  Neck: Normal range of motion. Neck supple. No JVD present.  Cardiovascular: Normal rate, regular rhythm and normal heart sounds.  Exam reveals no gallop and no friction rub.   No murmur heard. Pulmonary/Chest: Effort normal and breath sounds normal. No respiratory distress. No wheezes.No rales.  Abdominal: Soft. Bowel sounds are normal. No distension. There is no tenderness. There is no guarding.   Musculoskeletal: No edema or tenderness.  Lymphadenopathy: No cervical, axillary or supraclavicular adenopathy.  Neurological: Alert and oriented to person, place, and time. No cranial nerve deficit.  Skin: Skin is warm and dry. No rash noted. No erythema. No pallor.  Psychiatric: Affect and judgment normal.   Labs No visits with results within 3 Day(s) from this visit.  Latest known visit with results is:  Appointment on 05/20/2018  Component Date Value Ref Range Status  . WBC 05/20/2018 7.1  4.0 - 10.5 K/uL Final  . RBC 05/20/2018 4.66  3.87 - 5.11 MIL/uL Final  . Hemoglobin 05/20/2018 11.1* 12.0 - 15.0 g/dL Final  . HCT 05/20/2018 38.2  36.0 - 46.0 % Final  . MCV 05/20/2018 82.0  80.0 - 100.0 fL Final  . MCH 05/20/2018 23.8* 26.0 - 34.0 pg Final  . MCHC 05/20/2018 29.1* 30.0 - 36.0 g/dL Final  . RDW 05/20/2018 Not Measured  11.5 - 15.5 % Final  . Platelets 05/20/2018 260  150 - 400 K/uL Final   Comment: PLATELET CLUMPING, SUGGEST RECOLLECTION OF SAMPLE IN CITRATE TUBE. PLATELETS APPEAR ADEQUATE GIANT PLATELETS   . nRBC 05/20/2018 0.0  0.0 - 0.2 % Final  . Neutrophils Relative % 05/20/2018 75  % Final  . Neutro Abs 05/20/2018 5.3  1.7 - 7.7 K/uL Final  . Lymphocytes Relative 05/20/2018 15  % Final  . Lymphs Abs 05/20/2018 1.1  0.7 - 4.0 K/uL Final  . Monocytes Relative 05/20/2018 6  % Final  . Monocytes Absolute 05/20/2018  0.5  0.1 - 1.0 K/uL Final  . Eosinophils Relative 05/20/2018 3  % Final  . Eosinophils Absolute 05/20/2018 0.2  0.0 - 0.5 K/uL Final  . Basophils Relative 05/20/2018 1  % Final  . Basophils Absolute 05/20/2018 0.1  0.0 - 0.1 K/uL Final  . Immature Granulocytes 05/20/2018 0  % Final  . Abs Immature Granulocytes 05/20/2018 0.03  0.00 - 0.07 K/uL Final  . Dimorphism 05/20/2018 PRESENT   Final   Comment: SLIGHT Performed at [facility], [address] [place], [place] [address]   . Sodium 05/20/2018 141  135 - 145 mmol/L Final  . Potassium 05/20/2018 3.8   3.5 - 5.1 mmol/L Final  . Chloride 05/20/2018 104  98 - 111 mmol/L Final  . CO2 05/20/2018 26  22 - 32 mmol/L Final  . Glucose, Bld 05/20/2018 119* 70 - 99 mg/dL Final  . BUN 05/20/2018 21  8 - 23 mg/dL Final  . Creatinine, Ser 05/20/2018 1.30* 0.44 - 1.00 mg/dL Final  . Calcium 05/20/2018 9.5  8.9 - 10.3 mg/dL Final  . Total Protein 05/20/2018 7.6  6.5 - 8.1 g/dL Final  . Albumin 05/20/2018 4.4  3.5 - 5.0 g/dL Final  . AST 05/20/2018 29  15 - 41 U/L Final  . ALT 05/20/2018 26  0 - 44 U/L Final  . Alkaline Phosphatase 05/20/2018 59  38 - 126 U/L Final  . Total Bilirubin 05/20/2018 0.6  0.3 - 1.2 mg/dL Final  . GFR calc non Af Amer 05/20/2018 44* >60 mL/min Final  . GFR calc Af Amer 05/20/2018 51* >60 mL/min Final  . Anion gap 05/20/2018 11  5 - 15 Final   Performed at [facility], [address], [place], [place] [address]  . LDH 05/20/2018 158  98 - 192 U/L Final   Performed at [facility], [address] [place], [place] [address]  . Ferritin 05/20/2018 28  11 - 307 ng/mL Final   Performed at [facility], [address] [place], [place] [address]     Pathology Orders Placed This Encounter  Procedures  . CBC with Differential    Standing Status:   Future    Standing Expiration Date:   05/24/2019  . Comprehensive metabolic panel    Standing Status:   Future    Standing Expiration Date:   05/24/2019  . Lactate dehydrogenase    Standing Status:   Future    Standing Expiration Date:   05/24/2019  . Ferritin    Standing Status:   Future    Standing Expiration Date:   05/24/2019       Zoila Shutter MD

## 2018-05-26 ENCOUNTER — Ambulatory Visit (HOSPITAL_COMMUNITY)
Admission: RE | Admit: 2018-05-26 | Discharge: 2018-05-26 | Disposition: A | Discharge status: Home / Self Care | Payer: Commercial Managed Care - PPO | Source: Ambulatory Visit | Attending: Gastroenterology | Admitting: Gastroenterology

## 2018-05-26 DIAGNOSIS — K7030 Alcoholic cirrhosis of liver without ascites: Secondary | ICD-10-CM | POA: Diagnosis not present

## 2018-05-30 NOTE — Progress Notes (Signed)
PT is aware.

## 2018-05-30 NOTE — Progress Notes (Signed)
ON RECALL  °

## 2018-07-06 ENCOUNTER — Ambulatory Visit: Payer: Commercial Managed Care - PPO | Admitting: Adult Health

## 2018-08-08 ENCOUNTER — Other Ambulatory Visit (HOSPITAL_COMMUNITY): Payer: Self-pay | Admitting: *Deleted

## 2018-08-08 DIAGNOSIS — D539 Nutritional anemia, unspecified: Secondary | ICD-10-CM

## 2018-08-08 DIAGNOSIS — N183 Chronic kidney disease, stage 3 unspecified: Secondary | ICD-10-CM

## 2018-08-08 DIAGNOSIS — E538 Deficiency of other specified B group vitamins: Secondary | ICD-10-CM

## 2018-08-08 DIAGNOSIS — D500 Iron deficiency anemia secondary to blood loss (chronic): Secondary | ICD-10-CM

## 2018-08-09 ENCOUNTER — Inpatient Hospital Stay (HOSPITAL_COMMUNITY): Payer: Commercial Managed Care - PPO | Attending: Hematology

## 2018-08-09 ENCOUNTER — Other Ambulatory Visit: Payer: Self-pay

## 2018-08-09 DIAGNOSIS — Z7951 Long term (current) use of inhaled steroids: Secondary | ICD-10-CM | POA: Insufficient documentation

## 2018-08-09 DIAGNOSIS — Z7982 Long term (current) use of aspirin: Secondary | ICD-10-CM | POA: Diagnosis not present

## 2018-08-09 DIAGNOSIS — Z79899 Other long term (current) drug therapy: Secondary | ICD-10-CM | POA: Insufficient documentation

## 2018-08-09 DIAGNOSIS — R05 Cough: Secondary | ICD-10-CM | POA: Insufficient documentation

## 2018-08-09 DIAGNOSIS — K219 Gastro-esophageal reflux disease without esophagitis: Secondary | ICD-10-CM | POA: Insufficient documentation

## 2018-08-09 DIAGNOSIS — E538 Deficiency of other specified B group vitamins: Secondary | ICD-10-CM

## 2018-08-09 DIAGNOSIS — N183 Chronic kidney disease, stage 3 unspecified: Secondary | ICD-10-CM

## 2018-08-09 DIAGNOSIS — Z87891 Personal history of nicotine dependence: Secondary | ICD-10-CM | POA: Insufficient documentation

## 2018-08-09 DIAGNOSIS — I10 Essential (primary) hypertension: Secondary | ICD-10-CM | POA: Diagnosis not present

## 2018-08-09 DIAGNOSIS — R5383 Other fatigue: Secondary | ICD-10-CM | POA: Insufficient documentation

## 2018-08-09 DIAGNOSIS — D500 Iron deficiency anemia secondary to blood loss (chronic): Secondary | ICD-10-CM

## 2018-08-09 DIAGNOSIS — R197 Diarrhea, unspecified: Secondary | ICD-10-CM | POA: Insufficient documentation

## 2018-08-09 DIAGNOSIS — Z8249 Family history of ischemic heart disease and other diseases of the circulatory system: Secondary | ICD-10-CM | POA: Diagnosis not present

## 2018-08-09 DIAGNOSIS — D539 Nutritional anemia, unspecified: Secondary | ICD-10-CM

## 2018-08-09 LAB — CBC WITH DIFFERENTIAL/PLATELET
Abs Immature Granulocytes: 0.02 10*3/uL (ref 0.00–0.07)
Basophils Absolute: 0.1 10*3/uL (ref 0.0–0.1)
Basophils Relative: 1 %
Eosinophils Absolute: 0.2 10*3/uL (ref 0.0–0.5)
Eosinophils Relative: 3 %
HCT: 34.8 % — ABNORMAL LOW (ref 36.0–46.0)
Hemoglobin: 11.1 g/dL — ABNORMAL LOW (ref 12.0–15.0)
Immature Granulocytes: 0 %
Lymphocytes Relative: 24 %
Lymphs Abs: 1.5 10*3/uL (ref 0.7–4.0)
MCH: 27.8 pg (ref 26.0–34.0)
MCHC: 31.9 g/dL (ref 30.0–36.0)
MCV: 87.2 fL (ref 80.0–100.0)
Monocytes Absolute: 0.6 10*3/uL (ref 0.1–1.0)
Monocytes Relative: 9 %
Neutro Abs: 4 10*3/uL (ref 1.7–7.7)
Neutrophils Relative %: 63 %
Platelets: 283 10*3/uL (ref 150–400)
RBC: 3.99 MIL/uL (ref 3.87–5.11)
RDW: 17 % — ABNORMAL HIGH (ref 11.5–15.5)
WBC: 6.3 10*3/uL (ref 4.0–10.5)
nRBC: 0 % (ref 0.0–0.2)

## 2018-08-09 LAB — COMPREHENSIVE METABOLIC PANEL
ALT: 19 U/L (ref 0–44)
AST: 25 U/L (ref 15–41)
Albumin: 4.4 g/dL (ref 3.5–5.0)
Alkaline Phosphatase: 61 U/L (ref 38–126)
Anion gap: 12 (ref 5–15)
BUN: 21 mg/dL (ref 8–23)
CO2: 24 mmol/L (ref 22–32)
Calcium: 9.2 mg/dL (ref 8.9–10.3)
Chloride: 100 mmol/L (ref 98–111)
Creatinine, Ser: 1.22 mg/dL — ABNORMAL HIGH (ref 0.44–1.00)
GFR calc Af Amer: 55 mL/min — ABNORMAL LOW (ref >60)
GFR calc non Af Amer: 48 mL/min — ABNORMAL LOW (ref >60)
Glucose, Bld: 102 mg/dL — ABNORMAL HIGH (ref 70–99)
Potassium: 4.2 mmol/L (ref 3.5–5.1)
Sodium: 136 mmol/L (ref 135–145)
Total Bilirubin: 0.5 mg/dL (ref 0.3–1.2)
Total Protein: 7.4 g/dL (ref 6.5–8.1)

## 2018-08-09 LAB — LACTATE DEHYDROGENASE: LDH: 160 U/L (ref 98–192)

## 2018-08-09 LAB — FERRITIN: Ferritin: 5 ng/mL — ABNORMAL LOW (ref 11–307)

## 2018-08-12 ENCOUNTER — Encounter (HOSPITAL_COMMUNITY): Payer: Self-pay | Admitting: Nurse Practitioner

## 2018-08-16 ENCOUNTER — Other Ambulatory Visit: Payer: Self-pay

## 2018-08-16 ENCOUNTER — Inpatient Hospital Stay (HOSPITAL_BASED_OUTPATIENT_CLINIC_OR_DEPARTMENT_OTHER): Payer: Commercial Managed Care - PPO | Admitting: Nurse Practitioner

## 2018-08-16 ENCOUNTER — Encounter (HOSPITAL_COMMUNITY): Payer: Self-pay | Admitting: Nurse Practitioner

## 2018-08-16 DIAGNOSIS — Z7982 Long term (current) use of aspirin: Secondary | ICD-10-CM

## 2018-08-16 DIAGNOSIS — R5383 Other fatigue: Secondary | ICD-10-CM | POA: Diagnosis not present

## 2018-08-16 DIAGNOSIS — Z7951 Long term (current) use of inhaled steroids: Secondary | ICD-10-CM

## 2018-08-16 DIAGNOSIS — K219 Gastro-esophageal reflux disease without esophagitis: Secondary | ICD-10-CM

## 2018-08-16 DIAGNOSIS — R197 Diarrhea, unspecified: Secondary | ICD-10-CM | POA: Diagnosis not present

## 2018-08-16 DIAGNOSIS — Z87891 Personal history of nicotine dependence: Secondary | ICD-10-CM

## 2018-08-16 DIAGNOSIS — D500 Iron deficiency anemia secondary to blood loss (chronic): Secondary | ICD-10-CM | POA: Diagnosis not present

## 2018-08-16 DIAGNOSIS — R05 Cough: Secondary | ICD-10-CM

## 2018-08-16 DIAGNOSIS — Z79899 Other long term (current) drug therapy: Secondary | ICD-10-CM

## 2018-08-16 DIAGNOSIS — I10 Essential (primary) hypertension: Secondary | ICD-10-CM

## 2018-08-16 DIAGNOSIS — Z8249 Family history of ischemic heart disease and other diseases of the circulatory system: Secondary | ICD-10-CM

## 2018-08-16 NOTE — Patient Instructions (Signed)
[facility] Discharge Instructions  Follow up in 3 months with labs    Thank you for choosing [facility] at [facility] to provide your oncology and hematology care.  To afford each patient quality time with our provider, please arrive at least 15 minutes before your scheduled appointment time.   If you have a lab appointment with the Cancer Center please come in thru the  Main Entrance and check in at the main information desk  You need to re-schedule your appointment should you arrive 10 or more minutes late.  We strive to give you quality time with our providers, and arriving late affects you and other patients whose appointments are after yours.  Also, if you no show three or more times for appointments you may be dismissed from the clinic at the providers discretion.     Again, thank you for choosing [facility].  Our hope is that these requests will decrease the amount of time that you wait before being seen by our physicians.       _____________________________________________________________  Should you have questions after your visit to [facility], please contact our office at [phone] between the hours of 8:00 a.m. and 4:30 p.m.  Voicemails left after 4:00 p.m. will not be returned until the following business day.  For prescription refill requests, have your pharmacy contact our office and allow 72 hours.    Cancer Center Support Programs:   > Cancer Support Group  2nd Tuesday of the month 1pm-2pm, Journey Room    

## 2018-08-16 NOTE — Progress Notes (Signed)
[address], [place] [address]   CLINIC:  Medical Oncology/Hematology  PCP:  Mikey Kirschner, [place] [address] [phone]   REASON FOR VISIT: Follow-up for iron deficiency anemia  CURRENT THERAPY: Intermittent iron infusions   INTERVAL HISTORY:  [name] 63 y.o. female returns for routine follow-up for iron deficiency anemia.  She reports she has been doing well her last visit.  She does have a chronic cough from a cold however it is getting better.  Otherwise her fatigue is slowly starting to return.  She denies any bright red bleeding.Denies any nausea, vomiting, or diarrhea. Denies any new pains. Had not noticed any recent bleeding such as epistaxis, hematuria or hematochezia. Denies recent chest pain on exertion, shortness of breath on minimal exertion, pre-syncopal episodes, or palpitations. Denies any numbness or tingling in hands or feet. Denies any recent fevers, infections, or recent hospitalizations. Patient reports appetite at 100% and energy level at 50%.  She is eating well and maintaining her weight at this time.   REVIEW OF SYSTEMS:  Review of Systems  Constitutional: Positive for fatigue.  Respiratory: Positive for cough.   Gastrointestinal: Positive for diarrhea.  All other systems reviewed and are negative.    PAST MEDICAL/SURGICAL HISTORY:  Past Medical History:  Diagnosis Date  . Abdominal hernia   . Abscess of pulp of tooth 12/12/14   left side  . Anxiety   . Arthritis    back  and down flank  right side more than left  . B12 deficiency 06/12/2015  . Brain aneurysm    METAL COIL PREVENTS MRI IMAGING and CLIP  . Cirrhosis ([place])    Presumably alcoholic cirrhosis  . Complication of anesthesia   . Depression   . ETOH abuse    Quit in 07/2011   . Fatigue    due to Sjogren's disease  . Folate deficiency 06/30/2016  . GERD (gastroesophageal reflux disease)   . HTN (hypertension)     no longer medicated, dated on 04/25/12  . IBS (irritable bowel syndrome)   . Iron deficiency anemia due to chronic blood loss   . Myofascial pain   . Panic attack   . PONV (postoperative nausea and vomiting)   . Psoriatic arthritis ([place])   . Right inguinal hernia 08/16/2012  . Seizures ([place]) 1997   1 seizure from coil insertion but no more seizures and no meds for seizures  . Sjogren's disease ([place])   . [employer] HEAD 02/03/2007   Qualifier: Diagnosis of  By: Aline Brochure MD, Dorothyann Peng    . 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([place])    Past Surgical History:  Procedure Laterality Date  . AGILE CAPSULE N/A 06/21/2014   Procedure: AGILE CAPSULE;  Surgeon: Danie Binder, MD;  [facility];  Service: Endoscopy;  Laterality: N/A;  0700  . BIOPSY  10/13/2011   SLF: mild gastritis/duodenal polypoid lesion   . CEREBRAL ANEURYSM REPAIR  1997  . COLONOSCOPY  10/13/2011   IFO:YDXAJOIN hemorrhoids/varices rectal/lesion in ascending colon(HYPERPLASTIC POLYP)  . COLONOSCOPY N/A 01/17/2013   SLF: 1. Internal hemorrhoids 2. Ascending colon polyps 3.  Rectal varices.   . COLONOSCOPY N/A 08/10/2016   Dr. Oneida Alar: redundant left colon. external and internal hemorhroids. 5-10 year surveillance  . ESOPHAGOGASTRODUODENOSCOPY N/A 06/29/2014   SLF: 1. anemia due to polypoid lesions in teh antrum, and duodenal polyps 2. single duodenum AVM  . FLEXIBLE SIGMOIDOSCOPY N/A 01/21/2016  Procedure: FLEXIBLE SIGMOIDOSCOPY;  Surgeon: Danie Binder, MD;  [facility];  Service: Endoscopy;  Laterality: N/A;  245  . GIVENS CAPSULE STUDY N/A 06/29/2014   Procedure: GIVENS CAPSULE STUDY;  Surgeon: Danie Binder, MD;  [facility];  Service: Endoscopy;  Laterality: N/A;  . GIVENS CAPSULE STUDY N/A 07/13/2014   Procedure: GIVENS CAPSULE STUDY;  Surgeon: Danie Binder, MD;  [facility];  Service: Endoscopy;  Laterality: N/A;  700  . HEMORRHOID BANDING N/A 01/21/2016    Procedure: HEMORRHOID BANDING;  Surgeon: Danie Binder, MD;  [facility];  Service: Endoscopy;  Laterality: N/A;  . INGUINAL HERNIA REPAIR Right 08/28/2013   Procedure: RIGHT NGUINAL HERNIORRHAPHY WITH MESH;  Surgeon: Jamesetta So, MD;  Location: [facility];  Service: General;  Laterality: Right;  . INSERTION OF MESH N/A 06/09/2013   Procedure: INSERTION OF MESH;  Surgeon: Jamesetta So, MD;  Location: [facility];  Service: General;  Laterality: N/A;  . INSERTION OF MESH Right 08/28/2013   Procedure: INSERTION OF MESH;  Surgeon: Jamesetta So, MD;  Location: [facility];  Service: General;  Laterality: Right;  . PARACENTESIS  Feb 2014   10.7 liters  . TOOTH EXTRACTION Left 12/12/14  . UMBILICAL HERNIA REPAIR N/A 06/09/2013   Procedure: UMBILICAL HERNIORRHAPHY WITH MESH;  Surgeon: Jamesetta So, MD;  Location: [facility];  Service: General;  Laterality: N/A;     SOCIAL HISTORY:  Social History   Socioeconomic History  . Marital status: Married    Spouse name: Not on file  . Number of children: 0  . Years of education: Not on file  . Highest education level: Not on file  Occupational History    Employer: RETIRED  Social Needs  . Financial resource strain: Not on file  . Food insecurity:    Worry: Not on file    Inability: Not on file  . Transportation needs:    Medical: Not on file    Non-medical: Not on file  Tobacco Use  . Smoking status: Former Smoker    Packs/day: 2.00    Years: 12.00    Pack years: 24.00    Types: Cigarettes    Last attempt to quit: 03/31/1995    Years since quitting: 23.3  . Smokeless tobacco: Never Used  . Tobacco comment: quit in 1997  Substance and Sexual Activity  . Alcohol use: No    Comment: Bottle of wine a day for years, but quit in 07/2011  . Drug use: No  . Sexual activity: Not Currently    Birth control/protection: Post-menopausal  Lifestyle  . Physical activity:    Days per week: Not on file    Minutes per session: Not on file  . Stress:  Not on file  Relationships  . Social connections:    Talks on phone: Not on file    Gets together: Not on file    Attends religious service: Not on file    Active member of club or organization: Not on file    Attends meetings of clubs or organizations: Not on file    Relationship status: Not on file  . Intimate partner violence:    Fear of current or ex partner: Not on file    Emotionally abused: Not on file    Physically abused: Not on file    Forced sexual activity: Not on file  Other Topics Concern  . Not on file  Social History  Narrative   Lives   Caffeine use:    Quit drinking alcohol 3 YEARS ago. No drugs, no IVDU.     FAMILY HISTORY:  Family History  Problem Relation Age of Onset  . Cirrhosis Mother        nash  . COPD Father   . Heart disease Father   . Asthma Other   . Heart disease Other   . Colon cancer Neg Hx   . Inflammatory bowel disease Neg Hx     CURRENT MEDICATIONS:  Outpatient Encounter Medications as of 08/16/2018  Medication Sig  . aspirin 325 MG tablet Take 325 mg by mouth daily.  Marland Kitchen atorvastatin (LIPITOR) 40 MG tablet TAKE (1) TABLET BY MOUTH ONCE DAILY.  . bumetanide (BUMEX) 1 MG tablet TAKE (1) TABLET BY MOUTH (2) TIMES DAILY.  Marland Kitchen buPROPion (WELLBUTRIN SR) 100 MG 12 hr tablet TAKE (1) TABLET BY MOUTH TWICE DAILY.  Marland Kitchen docusate sodium (COLACE) 100 MG capsule Take 100 mg by mouth daily.   Marland Kitchen escitalopram (LEXAPRO) 20 MG tablet TAKE (1) TABLET BY MOUTH AT BEDTIME.  . famotidine (PEPCID) 20 MG tablet 1 PO BID MON-FRI  . fluticasone (FLONASE) 50 MCG/ACT nasal spray Place 1 spray into both nostrils See admin instructions. Instill 1 spray into each nostril every morning, may also use later in the day as needed for sinus congestion  . Lifitegrast (XIIDRA) 5 % SOLN Place 1 drop into both eyes 2 (two) times daily.  Marland Kitchen LINZESS 145 MCG CAPS capsule TAKE (1) CAPSULE BY MOUTH EVERY DAY BEFORE BREAKFAST.  Marland Kitchen loratadine (CLARITIN) 10 MG tablet Take 10 mg by mouth  daily.  Marland Kitchen losartan (COZAAR) 25 MG tablet TAKE (1) TABLET BY MOUTH ONCE DAILY.  . Multiple Vitamin (MULTIVITAMIN WITH MINERALS) TABS tablet Take 1 tablet by mouth daily.  . Polyvinyl Alcohol-Povidone (REFRESH OP) Place 1 drop into both eyes 3 (three) times daily as needed (dry eyes).  . Simethicone (GAS-X PO) Take 1 tablet by mouth 2 (two) times daily as needed (bloating/flatulence).  . sodium chloride (OCEAN) 0.65 % nasal spray Place 1 spray into the nose 2 (two) times daily as needed for congestion.   Marland Kitchen spironolactone (ALDACTONE) 100 MG tablet TAKE (1) TABLET BY MOUTH TWICE DAILY. (Patient taking differently: Take 50 mg by mouth 2 (two) times daily. TAKE (1) TABLET BY MOUTH TWICE DAILY.)  . Tofacitinib Citrate ER (XELJANZ XR) 11 MG TB24 Take by mouth. 1/2 tab daily  . meclizine (ANTIVERT) 25 MG tablet Take 1 tablet (25 mg total) by mouth 3 (three) times daily as needed for dizziness. (Patient not taking: Reported on 05/23/2018)  . ondansetron (ZOFRAN) 4 MG tablet Take 1 tablet (4 mg total) by mouth every 8 (eight) hours as needed for nausea or vomiting. (Patient not taking: Reported on 05/23/2018)  . [DISCONTINUED] hydrocortisone cream 1 % Apply 1 application topically 2 (two) times daily as needed (eczema in ears).   No facility-administered encounter medications on file as of 08/16/2018.     ALLERGIES:  Allergies  Allergen Reactions  . Omeprazole Shortness Of Breath  . Orencia [Abatacept] Anaphylaxis, Hives and Rash  . Other Shortness Of Breath and Rash    Red Meat Causes shortness of breath, rash and flu like symptoms.   . Phenytoin Rash  . Keflex [Cephalexin] Other (See Comments)    Severe yeast infection  . Phenergan [Promethazine Hcl] Other (See Comments)    Not in right state of mind, HALLUCINATION     PHYSICAL EXAM:  ECOG Performance status: 1  Vitals:   08/16/18 1100  BP: 118/71  Pulse: 66  Resp: 16  Temp: 98.2 F (36.8 C)  SpO2: 100%   Filed Weights   08/16/18 1100   Weight: 245 lb 5 oz (111.3 kg)    Physical Exam Constitutional:      Appearance: Normal appearance. She is normal weight.  Cardiovascular:     Rate and Rhythm: Normal rate and regular rhythm.     Heart sounds: Normal heart sounds.  Pulmonary:     Effort: Pulmonary effort is normal.     Breath sounds: Normal breath sounds.  Abdominal:     General: Bowel sounds are normal.     Palpations: Abdomen is soft.  Musculoskeletal: Normal range of motion.  Skin:    General: Skin is warm and dry.  Neurological:     Mental Status: She is alert and oriented to person, place, and time. Mental status is at baseline.  Psychiatric:        Mood and Affect: Mood normal.        Behavior: Behavior normal.        Thought Content: Thought content normal.        Judgment: Judgment normal.      LABORATORY DATA:  I have reviewed the labs as listed.  CBC    Component Value Date/Time   WBC 6.3 08/09/2018 1303   RBC 3.99 08/09/2018 1303   HGB 11.1 (L) 08/09/2018 1303   HCT 34.8 (L) 08/09/2018 1303   HCT 41 07/30/2011 0743   PLT 283 08/09/2018 1303   MCV 87.2 08/09/2018 1303   MCV 104.3 07/30/2011 0743   MCH 27.8 08/09/2018 1303   MCHC 31.9 08/09/2018 1303   RDW 17.0 (H) 08/09/2018 1303   LYMPHSABS 1.5 08/09/2018 1303   MONOABS 0.6 08/09/2018 1303   EOSABS 0.2 08/09/2018 1303   BASOSABS 0.1 08/09/2018 1303   CMP Latest Ref Rng & Units 08/09/2018 05/20/2018 04/08/2018  Glucose 70 - 99 mg/dL 102(H) 119(H) 139(H)  BUN 8 - 23 mg/dL 21 21 14   Creatinine 0.44 - 1.00 mg/dL 1.22(H) 1.30(H) 1.10(H)  Sodium 135 - 145 mmol/L 136 141 138  Potassium 3.5 - 5.1 mmol/L 4.2 3.8 4.1  Chloride 98 - 111 mmol/L 100 104 105  CO2 22 - 32 mmol/L 24 26 24   Calcium 8.9 - 10.3 mg/dL 9.2 9.5 9.4  Total Protein 6.5 - 8.1 g/dL 7.4 7.6 7.6  Total Bilirubin 0.3 - 1.2 mg/dL 0.5 0.6 0.6  Alkaline Phos 38 - 126 U/L 61 59 72  AST 15 - 41 U/L 25 29 28   ALT 0 - 44 U/L 19 26 17    I personally performed a face-to-face  visit.  All questions were answered to patient's stated satisfaction. Encouraged patient to call with any new concerns or questions before his next visit to the cancer center and we can certain see him sooner, if needed.     ASSESSMENT & PLAN:   Iron deficiency anemia due to chronic blood loss 1.  Iron deficiency anemia: - Due to chronic blood loss secondary to chronic GI blood loss. 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  Also showed polypoid lesions in the duodenum, single duodenum AVM. - Her colonoscopy on 01/17/2013 showed rectal varices, polyps and hemorrhoids. - She had a flex sigmoidoscopy with Dr. Oneida Alar on 01/21/2016 showing rectal bleeding due to proctitis and possibly internal hemorrhoids. -She  last had Feraheme on 04/12/2018 and 04/19/2018. -Labs on 08/09/2018 showed her hemoglobin 11.1, ferritin 5.  LDH 160. -Patient is hesitant about receiving iron infusions due to the cost. -I have recommended 1 iron infusion at this time.  She agreed to take it. -Patient was also advised to follow-up with GI due to the severity of her anemia. - She will follow-up in 3 months with repeat labs.      Orders placed this encounter:  Orders Placed This Encounter  Procedures  . Lactate dehydrogenase  . CBC with Differential/Platelet  . Comprehensive metabolic panel  . Ferritin  . Iron and TIBC  . Vitamin B12  . VITAMIN D 25 Hydroxy (Vit-D Deficiency, Fractures)  . Folate      Francene Finders, FNP-C [facility] [phone]

## 2018-08-16 NOTE — Assessment & Plan Note (Addendum)
1.  Iron deficiency anemia: - Due to chronic blood loss secondary to chronic GI blood loss. 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  Also showed polypoid lesions in the duodenum, single duodenum AVM. - Her colonoscopy on 01/17/2013 showed rectal varices, polyps and hemorrhoids. - She had a flex sigmoidoscopy with Dr. Oneida Alar on 01/21/2016 showing rectal bleeding due to proctitis and possibly internal hemorrhoids. -She last had Feraheme on 04/12/2018 and 04/19/2018. -Labs on 08/09/2018 showed her hemoglobin 11.1, ferritin 5.  LDH 160. -Patient is hesitant about receiving iron infusions due to the cost. -I have recommended 1 iron infusion at this time.  She agreed to take it. -Patient was also advised to follow-up with GI due to the severity of her anemia. - She will follow-up in 3 months with repeat labs.

## 2018-08-29 ENCOUNTER — Inpatient Hospital Stay (HOSPITAL_COMMUNITY): Payer: Commercial Managed Care - PPO | Attending: Nurse Practitioner

## 2018-08-29 ENCOUNTER — Other Ambulatory Visit: Payer: Self-pay

## 2018-08-29 VITALS — BP 105/58 | HR 65 | Temp 97.8°F | Resp 18

## 2018-08-29 DIAGNOSIS — D500 Iron deficiency anemia secondary to blood loss (chronic): Secondary | ICD-10-CM | POA: Diagnosis present

## 2018-08-29 MED ORDER — SODIUM CHLORIDE 0.9 % IV SOLN
INTRAVENOUS | Status: DC
  Administered 2018-08-29: 13:00:00 via INTRAVENOUS

## 2018-08-29 MED ORDER — SODIUM CHLORIDE 0.9 % IV SOLN
510.0000 mg | Freq: Once | INTRAVENOUS | Status: AC
  Administered 2018-08-29: 510 mg via INTRAVENOUS
  Filled 2018-08-29: qty 510

## 2018-08-29 NOTE — Patient Instructions (Signed)

## 2018-08-29 NOTE — Progress Notes (Signed)
To treatment room for iron infusion.  No complaints voiced.  No s/s of distress noted.    Patient tolerated iron infusion with no complaints voiced.  Peripheral IV site clean and dry with good blood return noted before and after infusion. Band aid applied.     Patient stated she felt "funny headed" after d/c of peripheral IV.  Patient also stated she was a "hypochondriac" and lets little things bother her.  Reviewed the s/s of an iron infusion reaction and printed the information for review and home.  Patient stated she felt fine after eating a snack and drinking a soda.  No s/s of distress noted.  Patient warm and dry.  Left ambulatory with no s/s of distress noted.

## 2018-09-12 ENCOUNTER — Other Ambulatory Visit: Payer: Self-pay | Admitting: Family Medicine

## 2018-09-12 MED ORDER — ATORVASTATIN CALCIUM 40 MG PO TABS: ORAL_TABLET | ORAL | 2 refills | Status: DC

## 2018-09-12 NOTE — Telephone Encounter (Signed)
Three mo worth

## 2018-10-17 ENCOUNTER — Other Ambulatory Visit: Payer: Self-pay

## 2018-10-17 ENCOUNTER — Other Ambulatory Visit: Payer: Self-pay | Admitting: Gastroenterology

## 2018-10-17 ENCOUNTER — Other Ambulatory Visit: Payer: Commercial Managed Care - PPO

## 2018-10-17 ENCOUNTER — Ambulatory Visit (INDEPENDENT_AMBULATORY_CARE_PROVIDER_SITE_OTHER): Payer: Commercial Managed Care - PPO | Admitting: Family Medicine

## 2018-10-17 ENCOUNTER — Telehealth: Payer: Self-pay | Admitting: *Deleted

## 2018-10-17 DIAGNOSIS — I10 Essential (primary) hypertension: Secondary | ICD-10-CM

## 2018-10-17 DIAGNOSIS — Z20822 Contact with and (suspected) exposure to covid-19: Secondary | ICD-10-CM

## 2018-10-17 DIAGNOSIS — F321 Major depressive disorder, single episode, moderate: Secondary | ICD-10-CM | POA: Diagnosis not present

## 2018-10-17 DIAGNOSIS — F5101 Primary insomnia: Secondary | ICD-10-CM | POA: Diagnosis not present

## 2018-10-17 DIAGNOSIS — N183 Chronic kidney disease, stage 3 unspecified: Secondary | ICD-10-CM

## 2018-10-17 DIAGNOSIS — B349 Viral infection, unspecified: Secondary | ICD-10-CM

## 2018-10-17 MED ORDER — BUPROPION HCL ER (SR) 100 MG PO TB12: ORAL_TABLET | ORAL | 5 refills | Status: DC

## 2018-10-17 MED ORDER — ATORVASTATIN CALCIUM 40 MG PO TABS: ORAL_TABLET | ORAL | 5 refills | Status: DC

## 2018-10-17 MED ORDER — LOSARTAN POTASSIUM 25 MG PO TABS: ORAL_TABLET | ORAL | 5 refills | Status: DC

## 2018-10-17 MED ORDER — ESCITALOPRAM OXALATE 20 MG PO TABS: ORAL_TABLET | ORAL | 5 refills | Status: DC

## 2018-10-17 NOTE — Telephone Encounter (Signed)
Pt had virtual visit today and needs covid 19 test. Please call pt and set up appointment. Thanks.

## 2018-10-17 NOTE — Telephone Encounter (Signed)
As of 10/12/18 the pt does not need to have an appointment for COVID-19 testing. Pt can be advised to go to testing sites, Monday-Friday from 8a-3:45 pm without an appointment. Referring provider can also place the order for testing. Appointments and orders no longer have to come through the [facility].

## 2018-10-17 NOTE — Progress Notes (Signed)
   Subjective:  Audio plus video patient arrives office with cold concerns and new ones  Patient ID: Deanna Thompson, female    DOB: 1955-12-27, 63 y.o.   MRN: [id number]  HPImed check up on depression. Pt taking wellbutrin and lexapro. Pt states doing well on meds.   Really bad heart burn, Vomiting, Nausea, abdominal swelling, dizziness, low grade fever, hot and cold, a lot of sinus drainage. Started about one week ago.  Patient experiencing worsening nausea.  No diarrhea.  Energy overall not good  Followed by the gastroenterologist for chronic cirrhosis element  Patient continues to take lipid medication regularly. No obvious side effects from it. Generally does not miss a dose. Prior blood work results are reviewed with patient. Patient continues to work on fat intake in diet   Virtual Visit via Video Note  I connected with [place] on 10/17/18 at  1:40 PM EDT by a video enabled telemedicine application and verified that I am speaking with the correct person using two identifiers.  Location: Patient: home Provider: office   I discussed the limitations of evaluation and management by telemedicine and the availability of in person appointments. The patient expressed understanding and agreed to proceed.  History of Present Illness:    Observations/Objective:   Assessment and Plan:   Follow Up Instructions:    I discussed the assessment and treatment plan with the patient. The patient was provided an opportunity to ask questions and all were answered. The patient agreed with the plan and demonstrated an understanding of the instructions.   The patient was advised to call back or seek an in-person evaluation if the symptoms worsen or if the condition fails to improve as anticipated.  I provided 25 minutes of non-face-to-face time during this encounter.  Patient notes ongoing compliance with antidepressant medication. No obvious side effects. Reports does not miss a  dose. Overall continues to help depression substantially. No thoughts of homicide or suicide. Would like to maintain medication.   Review of Systems No headache, no major weight loss or weight gain, no chest pain no back pain abdominal pain no change in bowel habits complete ROS otherwise negative     Objective:   Physical Exam  Virtual      Assessment & Plan:  Impression acute on low-grade fever illness accompanied by both GI and respiratory symptomatology.  May well be viral discussed.  Probably should do COVID-19 test rationale discussed.  2.  Hyperlipidemia prior blood work good compliance discussed maintain same  3.  Depression clinically stable maintain same meds  Zofran called in for nausea.  Chronic medications refilled.  Diet exercise discussed.  Further recommendations based on COVID-19 test warning signs discussed carefully due to patient's numerous risk factors

## 2018-10-17 NOTE — Telephone Encounter (Signed)
Pt was called and notified to go to testing center without appt.

## 2018-10-18 ENCOUNTER — Telehealth: Payer: Self-pay | Admitting: Gastroenterology

## 2018-10-18 ENCOUNTER — Telehealth: Payer: Self-pay | Admitting: Family Medicine

## 2018-10-18 MED ORDER — HYDROXYZINE HCL 25 MG PO TABS: ORAL_TABLET | ORAL | 0 refills | Status: DC

## 2018-10-18 NOTE — Telephone Encounter (Signed)
Medication sent in and pt husband was informed.

## 2018-10-18 NOTE — Telephone Encounter (Signed)
PLEASE CALL PT. HER SYMPTOMS SEEM NONSPECIFIC: DIARRHEA, QUEAZY STOMACH, HEARTBURN. NONE OF THESE SYMPTOMS INDICATE PROBLEMS WITH HER LIVER.   SHE SHOULD CHECK OUT THE BOOK BY DR. MARK HYMAN, "THE 10 DAY DETOX DIET". HER GUT WILL FEEL BETTER IF SHE CHANGES HER DIET AND FOLLOWS HIS RECOMMENDATIONS.

## 2018-10-18 NOTE — Telephone Encounter (Signed)
Recall sent 

## 2018-10-18 NOTE — Telephone Encounter (Signed)
Patient calling back with new symptom today.  Itching all over but doesn't see any rashes.  Started last night.  Has taken Benadryl to help and is fine with this unless Dr. Richardson Landry thinks she should be taking something else.

## 2018-10-18 NOTE — Telephone Encounter (Signed)
Pt called asking for DS or to either be put in VM. I told her DS was on another line and transferred call to VM.

## 2018-10-18 NOTE — Telephone Encounter (Signed)
Spoke with pt husband (DPR); husband states that pt is itching all over. Pt is feeling a little better today. Pt has been using Benadryl but it is not helping. Please advise. Thank you.  [place]

## 2018-10-18 NOTE — Telephone Encounter (Signed)
Repeat ultrasound ?

## 2018-10-18 NOTE — Telephone Encounter (Signed)
Pt reports the last couple of weeks not feeling well.  She had a little diarrhea a 2-3 days but not much and she is feeling better.  Said her stomach just feels weird at times.  She recently realized she was to take Pepcid bid x 5 days and hold 2 days and she has been taking bid 7 days a week and wondered if that could contribute to her problem.  She has also had some bad heart burn a few times and several episodes of a little dizziness. She just wanted to check in with Dr. Oneida Alar and see if she needs to do anything or be concerned about her liver.  Dr. Oneida Alar, please advise!

## 2018-10-18 NOTE — Telephone Encounter (Signed)
Hydroxyzine 25 thirty-six one q six hrs prn itching

## 2018-10-19 ENCOUNTER — Encounter: Payer: Self-pay | Admitting: Family Medicine

## 2018-10-19 NOTE — Telephone Encounter (Signed)
Pt is aware.  

## 2018-10-20 ENCOUNTER — Emergency Department (HOSPITAL_COMMUNITY): Payer: Commercial Managed Care - PPO

## 2018-10-20 ENCOUNTER — Other Ambulatory Visit: Payer: Self-pay

## 2018-10-20 ENCOUNTER — Observation Stay (HOSPITAL_COMMUNITY)
Admission: EM | Admit: 2018-10-20 | Discharge: 2018-10-22 | Disposition: A | Discharge status: Home / Self Care | Payer: Commercial Managed Care - PPO | Attending: Family Medicine | Admitting: Family Medicine

## 2018-10-20 ENCOUNTER — Encounter (HOSPITAL_COMMUNITY): Payer: Self-pay | Admitting: Emergency Medicine

## 2018-10-20 DIAGNOSIS — I129 Hypertensive chronic kidney disease with stage 1 through stage 4 chronic kidney disease, or unspecified chronic kidney disease: Secondary | ICD-10-CM | POA: Insufficient documentation

## 2018-10-20 DIAGNOSIS — K810 Acute cholecystitis: Secondary | ICD-10-CM | POA: Diagnosis not present

## 2018-10-20 DIAGNOSIS — Z20828 Contact with and (suspected) exposure to other viral communicable diseases: Secondary | ICD-10-CM | POA: Diagnosis not present

## 2018-10-20 DIAGNOSIS — R748 Abnormal levels of other serum enzymes: Secondary | ICD-10-CM

## 2018-10-20 DIAGNOSIS — N183 Chronic kidney disease, stage 3 (moderate): Secondary | ICD-10-CM | POA: Diagnosis not present

## 2018-10-20 DIAGNOSIS — IMO0001 Reserved for inherently not codable concepts without codable children: Secondary | ICD-10-CM

## 2018-10-20 DIAGNOSIS — R109 Unspecified abdominal pain: Secondary | ICD-10-CM | POA: Diagnosis present

## 2018-10-20 DIAGNOSIS — K851 Biliary acute pancreatitis without necrosis or infection: Secondary | ICD-10-CM | POA: Diagnosis not present

## 2018-10-20 DIAGNOSIS — R1110 Vomiting, unspecified: Secondary | ICD-10-CM

## 2018-10-20 DIAGNOSIS — Z7982 Long term (current) use of aspirin: Secondary | ICD-10-CM | POA: Diagnosis not present

## 2018-10-20 DIAGNOSIS — Z87891 Personal history of nicotine dependence: Secondary | ICD-10-CM | POA: Diagnosis not present

## 2018-10-20 DIAGNOSIS — Z789 Other specified health status: Secondary | ICD-10-CM

## 2018-10-20 LAB — COMPREHENSIVE METABOLIC PANEL
ALT: 108 U/L — ABNORMAL HIGH (ref 0–44)
AST: 93 U/L — ABNORMAL HIGH (ref 15–41)
Albumin: 3.8 g/dL (ref 3.5–5.0)
Alkaline Phosphatase: 185 U/L — ABNORMAL HIGH (ref 38–126)
Anion gap: 11 (ref 5–15)
BUN: 19 mg/dL (ref 8–23)
CO2: 23 mmol/L (ref 22–32)
Calcium: 9.3 mg/dL (ref 8.9–10.3)
Chloride: 103 mmol/L (ref 98–111)
Creatinine, Ser: 1.48 mg/dL — ABNORMAL HIGH (ref 0.44–1.00)
GFR calc Af Amer: 44 mL/min — ABNORMAL LOW (ref >60)
GFR calc non Af Amer: 38 mL/min — ABNORMAL LOW (ref >60)
Glucose, Bld: 113 mg/dL — ABNORMAL HIGH (ref 70–99)
Potassium: 2.6 mmol/L — CL (ref 3.5–5.1)
Sodium: 137 mmol/L (ref 135–145)
Total Bilirubin: 4.4 mg/dL — ABNORMAL HIGH (ref 0.3–1.2)
Total Protein: 7.7 g/dL (ref 6.5–8.1)

## 2018-10-20 LAB — CBC
HCT: 37.2 % (ref 36.0–46.0)
Hemoglobin: 11.9 g/dL — ABNORMAL LOW (ref 12.0–15.0)
MCH: 29.2 pg (ref 26.0–34.0)
MCHC: 32 g/dL (ref 30.0–36.0)
MCV: 91.4 fL (ref 80.0–100.0)
Platelets: 261 10*3/uL (ref 150–400)
RBC: 4.07 MIL/uL (ref 3.87–5.11)
RDW: 19.4 % — ABNORMAL HIGH (ref 11.5–15.5)
WBC: 8.3 10*3/uL (ref 4.0–10.5)
nRBC: 0 % (ref 0.0–0.2)

## 2018-10-20 LAB — NOVEL CORONAVIRUS, NAA: SARS-CoV-2, NAA: NOT DETECTED

## 2018-10-20 LAB — MAGNESIUM: Magnesium: 2 mg/dL (ref 1.7–2.4)

## 2018-10-20 LAB — LIPASE, BLOOD: Lipase: 258 U/L — ABNORMAL HIGH (ref 11–51)

## 2018-10-20 LAB — SARS CORONAVIRUS 2 BY RT PCR (HOSPITAL ORDER, PERFORMED IN ~~LOC~~ HOSPITAL LAB): SARS Coronavirus 2: NEGATIVE

## 2018-10-20 MED ORDER — SODIUM CHLORIDE 0.9 % IV SOLN
2.0000 g | INTRAVENOUS | Status: DC
  Administered 2018-10-20 – 2018-10-21 (×2): 2 g via INTRAVENOUS
  Filled 2018-10-20 (×2): qty 20

## 2018-10-20 MED ORDER — LOSARTAN POTASSIUM 50 MG PO TABS
25.0000 mg | ORAL_TABLET | Freq: Every day | ORAL | Status: DC
  Administered 2018-10-21: 25 mg via ORAL
  Filled 2018-10-20: qty 1

## 2018-10-20 MED ORDER — METRONIDAZOLE IN NACL 5-0.79 MG/ML-% IV SOLN
500.0000 mg | Freq: Three times a day (TID) | INTRAVENOUS | Status: DC
  Administered 2018-10-20 – 2018-10-22 (×5): 500 mg via INTRAVENOUS
  Filled 2018-10-20 (×5): qty 100

## 2018-10-20 MED ORDER — ONDANSETRON HCL 4 MG PO TABS: 4.0000 mg | ORAL_TABLET | Freq: Four times a day (QID) | ORAL | Status: DC | PRN

## 2018-10-20 MED ORDER — MAGNESIUM SULFATE 2 GM/50ML IV SOLN
2.0000 g | INTRAVENOUS | Status: AC
  Administered 2018-10-20: 2 g via INTRAVENOUS
  Filled 2018-10-20: qty 50

## 2018-10-20 MED ORDER — LORATADINE 10 MG PO TABS
10.0000 mg | ORAL_TABLET | Freq: Every day | ORAL | Status: DC
  Administered 2018-10-21 – 2018-10-22 (×2): 10 mg via ORAL
  Filled 2018-10-20 (×2): qty 1

## 2018-10-20 MED ORDER — SODIUM CHLORIDE 0.9% FLUSH
3.0000 mL | Freq: Once | INTRAVENOUS | Status: AC
  Administered 2018-10-20: 3 mL via INTRAVENOUS

## 2018-10-20 MED ORDER — POLYETHYLENE GLYCOL 3350 17 G PO PACK: 17.0000 g | PACK | Freq: Every day | ORAL | Status: DC | PRN

## 2018-10-20 MED ORDER — TOFACITINIB CITRATE ER 11 MG PO TB24: 0.5000 | ORAL_TABLET | Freq: Every day | ORAL | Status: DC

## 2018-10-20 MED ORDER — ESCITALOPRAM OXALATE 10 MG PO TABS
20.0000 mg | ORAL_TABLET | Freq: Every day | ORAL | Status: DC
  Administered 2018-10-20 – 2018-10-21 (×2): 20 mg via ORAL
  Filled 2018-10-20 (×2): qty 2

## 2018-10-20 MED ORDER — LIFITEGRAST 5 % OP SOLN: 1.0000 [drp] | Freq: Two times a day (BID) | OPHTHALMIC | Status: DC

## 2018-10-20 MED ORDER — FAMOTIDINE IN NACL 20-0.9 MG/50ML-% IV SOLN
20.0000 mg | Freq: Two times a day (BID) | INTRAVENOUS | Status: DC
  Administered 2018-10-20 – 2018-10-21 (×2): 20 mg via INTRAVENOUS
  Filled 2018-10-20 (×2): qty 50

## 2018-10-20 MED ORDER — SODIUM CHLORIDE 0.9 % IV BOLUS
1000.0000 mL | Freq: Once | INTRAVENOUS | Status: AC
  Administered 2018-10-20: 1000 mL via INTRAVENOUS

## 2018-10-20 MED ORDER — KCL IN DEXTROSE-NACL 40-5-0.9 MEQ/L-%-% IV SOLN
INTRAVENOUS | Status: AC
  Administered 2018-10-20: via INTRAVENOUS
  Filled 2018-10-20 (×2): qty 1000

## 2018-10-20 MED ORDER — IOHEXOL 300 MG/ML  SOLN
75.0000 mL | Freq: Once | INTRAMUSCULAR | Status: AC | PRN
  Administered 2018-10-20: 75 mL via INTRAVENOUS

## 2018-10-20 MED ORDER — MORPHINE SULFATE (PF) 2 MG/ML IV SOLN: 2.0000 mg | INTRAVENOUS | Status: DC | PRN

## 2018-10-20 MED ORDER — POTASSIUM CHLORIDE 10 MEQ/100ML IV SOLN
10.0000 meq | INTRAVENOUS | Status: DC
  Administered 2018-10-20: 10 meq via INTRAVENOUS
  Filled 2018-10-20: qty 100

## 2018-10-20 MED ORDER — POTASSIUM CHLORIDE CRYS ER 20 MEQ PO TBCR
40.0000 meq | EXTENDED_RELEASE_TABLET | Freq: Once | ORAL | Status: AC
  Administered 2018-10-20: 40 meq via ORAL
  Filled 2018-10-20: qty 2

## 2018-10-20 MED ORDER — BUPROPION HCL ER (SR) 100 MG PO TB12
100.0000 mg | ORAL_TABLET | Freq: Two times a day (BID) | ORAL | Status: DC
  Administered 2018-10-21 – 2018-10-22 (×3): 100 mg via ORAL
  Filled 2018-10-20 (×3): qty 1

## 2018-10-20 MED ORDER — POTASSIUM CHLORIDE 10 MEQ/100ML IV SOLN
10.0000 meq | Freq: Once | INTRAVENOUS | Status: AC
  Administered 2018-10-20: 10 meq via INTRAVENOUS
  Filled 2018-10-20: qty 100

## 2018-10-20 MED ORDER — POTASSIUM CHLORIDE 10 MEQ/100ML IV SOLN: 10.0000 meq | Freq: Once | INTRAVENOUS | Status: DC

## 2018-10-20 MED ORDER — POLYVINYL ALCOHOL 1.4 % OP SOLN
2.0000 [drp] | OPHTHALMIC | Status: DC | PRN
  Filled 2018-10-20: qty 15

## 2018-10-20 MED ORDER — METOCLOPRAMIDE HCL 5 MG/ML IJ SOLN
10.0000 mg | Freq: Once | INTRAMUSCULAR | Status: AC
  Administered 2018-10-20: 10 mg via INTRAVENOUS
  Filled 2018-10-20: qty 2

## 2018-10-20 MED ORDER — ONDANSETRON HCL 4 MG/2ML IJ SOLN: 4.0000 mg | Freq: Four times a day (QID) | INTRAMUSCULAR | Status: DC | PRN

## 2018-10-20 NOTE — ED Triage Notes (Signed)
Pt reports for 2 weeks she has intermittent N/V/D and malaise. She was seen by Dr Wolfgang Phoenix  Covid pending. Pt reports right upper quad pain that started today, but after she vomited it felt matter

## 2018-10-20 NOTE — ED Notes (Signed)
CRITICAL VALUE ALERT  Critical Value:  Potassium 2.6 Date & Time Notied:  10/20/18 Provider Notified:Dr Sabra Heck Orders Received/Actions taken: None yet

## 2018-10-20 NOTE — H&P (Addendum)
History and Physical    Kiln GYJ:[id number] DOB: 1956/03/06 DOA: 10/20/2018  PCP: Mikey Kirschner, MD   Patient coming from: Home  I have personally briefly reviewed patient's old medical records in [place]  Chief Complaint: Abdominal Pain  HPI: [place] is a 63 y.o. female with medical history significant for liver cirrhosis, hypertension, CKD 3, CVA, psoriatic arthritis, alcohol abuse, brain aneurysm s/p clipping and coiling, scented to the ED with complaints of intermittent vomiting and intermittent loose stools over the past 2 weeks.  She reports sudden onset and severe right upper quadrant pain today-unrelated to food.  But she reports bloating with meals.  She reports fevers and chills at home, but maximum temperature 99.7. Patient used to drink but quit drinking 8 years ago, denies any intake since then.  She is compliant with her Bumex twice daily for swelling.  Saw her PCP for similar complaints 10/17/2018, had COVID testing ordered which was pending-but not shows resulted as negative.  ED Course: Stable vitals.  Temperature 97.9.  WBC 8.3.  Potassium 2.6.  Elevated liver enzymes, AST 93, ALT 108, ALP 185, Total bilirubin 4.4.  Lipase 258.  Creatinine 1.48, baseline 1.1-1.3.  CT abdomen and pelvis with contrast-cholelithiasis with findings concerning for acute cholecystitis, liver cirrhosis.  EKG shows sinus rhythm with old LBBB.  EDP talked to Dr. Arnoldo Morale, who will see in a.m., possible gallstone pancreatitis.  Review of Systems: As per HPI all other systems reviewed and negative.  Past Medical History:  Diagnosis Date   Abdominal hernia    Abscess of pulp of tooth 12/12/14   left side   Anxiety    Arthritis    back  and down flank  right side more than left   B12 deficiency 06/12/2015   Brain aneurysm    METAL COIL PREVENTS MRI IMAGING and CLIP   Cirrhosis ([place])    Presumably alcoholic cirrhosis   Complication of anesthesia     Depression    ETOH abuse    Quit in 07/2011    Fatigue    due to Sjogren's disease   Folate deficiency 06/30/2016   GERD (gastroesophageal reflux disease)    HTN (hypertension)    no longer medicated, dated on 04/25/12   IBS (irritable bowel syndrome)    Iron deficiency anemia due to chronic blood loss    Myofascial pain    Panic attack    PONV (postoperative nausea and vomiting)    Psoriatic arthritis ([place])    Right inguinal hernia 08/16/2012   Seizures ([place]) 1997   1 seizure from coil insertion but no more seizures and no meds for seizures   Sjogren's disease ([place])    [place] HEAD 02/03/2007   Qualifier: Diagnosis of  By: Aline Brochure MD, [place]     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[facility])     Past Surgical History:  Procedure Laterality Date   AGILE CAPSULE N/A 06/21/2014   Procedure: AGILE CAPSULE;  Surgeon: Danie Binder, MD;  [facility];  Service: Endoscopy;  Laterality: N/A;  0700   BIOPSY  10/13/2011   SLF: mild gastritis/duodenal polypoid lesion    CEREBRAL ANEURYSM REPAIR  1997   COLONOSCOPY  10/13/2011   YOV:ZCHYIFOY hemorrhoids/varices rectal/lesion in ascending colon(HYPERPLASTIC POLYP)   COLONOSCOPY N/A 01/17/2013   SLF: 1. Internal hemorrhoids 2. Ascending colon polyps 3.  Rectal varices.    COLONOSCOPY N/A 08/10/2016   Dr. Oneida Alar: redundant left colon. external and internal hemorhroids. 5-10 year surveillance  ESOPHAGOGASTRODUODENOSCOPY N/A 06/29/2014   SLF: 1. anemia due to polypoid lesions in teh antrum, and duodenal polyps 2. single duodenum AVM   FLEXIBLE SIGMOIDOSCOPY N/A 01/21/2016   Procedure: FLEXIBLE SIGMOIDOSCOPY;  Surgeon: Danie Binder, MD;  [facility];  Service: Endoscopy;  Laterality: N/A;  [place] N/A 06/29/2014   Procedure: GIVENS CAPSULE STUDY;  Surgeon: Danie Binder, MD;  [facility];  Service: Endoscopy;  Laterality: N/A;   GIVENS CAPSULE STUDY N/A  07/13/2014   Procedure: GIVENS CAPSULE STUDY;  Surgeon: Danie Binder, MD;  [facility];  Service: Endoscopy;  Laterality: N/A;  [place] N/A 01/21/2016   Procedure: HEMORRHOID BANDING;  Surgeon: Danie Binder, MD;  [facility];  Service: Endoscopy;  Laterality: N/A;   INGUINAL HERNIA REPAIR Right 08/28/2013   Procedure: RIGHT NGUINAL HERNIORRHAPHY WITH MESH;  Surgeon: Jamesetta So, MD;  Location: [facility];  Service: General;  Laterality: Right;   INSERTION OF MESH N/A 06/09/2013   Procedure: INSERTION OF MESH;  Surgeon: Jamesetta So, MD;  Location: [facility];  Service: General;  Laterality: N/A;   INSERTION OF MESH Right 08/28/2013   Procedure: INSERTION OF MESH;  Surgeon: Jamesetta So, MD;  Location: [facility];  Service: General;  Laterality: Right;   PARACENTESIS  Feb 2014   10.7 liters   TOOTH EXTRACTION Left 7/00/17   UMBILICAL HERNIA REPAIR N/A 06/09/2013   Procedure: UMBILICAL HERNIORRHAPHY WITH MESH;  Surgeon: Jamesetta So, MD;  Location: [facility];  Service: General;  Laterality: N/A;     reports that she quit smoking about 23 years ago. Her smoking use included cigarettes. She has a 24.00 pack-year smoking history. She has never used smokeless tobacco. She reports that she does not drink alcohol or use drugs.  Allergies  Allergen Reactions   Omeprazole Shortness Of Breath   Orencia [Abatacept] Anaphylaxis, Hives and Rash   Other Shortness Of Breath and Rash    Red Meat Causes shortness of breath, rash and flu like symptoms.    Phenytoin Rash   Keflex [Cephalexin] Other (See Comments)    Severe yeast infection   Phenergan [Promethazine Hcl] Other (See Comments)    Not in right state of mind, HALLUCINATION    Family History  Problem Relation Age of Onset   Cirrhosis Mother        nash   COPD Father    Heart disease Father    Asthma Other    Heart disease Other    Colon cancer Neg Hx    Inflammatory bowel disease Neg  Hx     Prior to Admission medications   Medication Sig Start Date End Date Taking? Authorizing Provider  aspirin 325 MG tablet Take 325 mg by mouth daily.   Yes [provider]  atorvastatin (LIPITOR) 40 MG tablet TAKE (1) TABLET BY MOUTH ONCE DAILY 10/17/18  Yes Mikey Kirschner, MD  bumetanide (BUMEX) 1 MG tablet TAKE (1) TABLET BY MOUTH (2) TIMES DAILY. 03/04/18  Yes Annitta Needs, NP  buPROPion (WELLBUTRIN SR) 100 MG 12 hr tablet TAKE (1) TABLET BY MOUTH TWICE DAILY. 10/17/18  Yes Mikey Kirschner, MD  docusate sodium (COLACE) 100 MG capsule Take 100 mg by mouth daily.    Yes [provider]  escitalopram (LEXAPRO) 20 MG tablet TAKE (1) TABLET BY MOUTH AT BEDTIME. 10/17/18  Yes Mikey Kirschner, MD  famotidine (PEPCID)  20 MG tablet 1 PO BID MON-FRI 01/26/18  Yes Fields, Sandi L, MD  fluticasone (FLONASE) 50 MCG/ACT nasal spray Place 1 spray into both nostrils See admin instructions. Instill 1 spray into each nostril every morning, may also use later in the day as needed for sinus congestion   Yes [provider]  hydrOXYzine (ATARAX/VISTARIL) 25 MG tablet Take one tablet by mouth every 6 hrs prn itching 10/18/18  Yes Mikey Kirschner, MD  Lifitegrast [place] Fri[place]) 5 % SOLN Place 1 drop into both eyes 2 (two) times daily.   Yes [provider]  LINZESS 145 MCG CAPS capsule TAKE (1) CAPSULE BY MOUTH EVERY DAY BEFORE BREAKFAST. 03/28/18  Yes Annitta Needs, NP  loratadine (CLARITIN) 10 MG tablet Take 10 mg by mouth daily.   Yes [provider]  losartan (COZAAR) 25 MG tablet TAKE (1) TABLET BY MOUTH ONCE DAILY. 10/17/18  Yes Mikey Kirschner, MD  Multiple Vitamin (MULTIVITAMIN WITH MINERALS) TABS tablet Take 1 tablet by mouth daily.   Yes [provider]  ondansetron (ZOFRAN) 4 MG tablet Take 1 tablet (4 mg total) by mouth every 8 (eight) hours as needed for nausea or vomiting. 08/02/17  Yes Patrecia Pour, MD  Polyvinyl Alcohol-Povidone (REFRESH OP)  Place 1 drop into both eyes 3 (three) times daily as needed (dry eyes).   Yes [provider]  Simethicone (GAS-X PO) Take 1 tablet by mouth 2 (two) times daily as needed (bloating/flatulence).   Yes [provider]  sodium chloride (OCEAN) 0.65 % nasal spray Place 1 spray into the nose 2 (two) times daily as needed for congestion.    Yes [provider]  spironolactone (ALDACTONE) 100 MG tablet TAKE (1) TABLET BY MOUTH TWICE DAILY. 10/18/18  Yes Mahala Menghini, PA-C  Tofacitinib Citrate ER (XELJANZ XR) 11 MG TB24 Take 1 tablet by mouth daily. 1/2 tab daily    Yes [provider]  meclizine (ANTIVERT) 25 MG tablet Take 1 tablet (25 mg total) by mouth 3 (three) times daily as needed for dizziness. Patient not taking: Reported on 05/23/2018 08/02/17   Patrecia Pour, MD    Physical Exam: Vitals:   10/20/18 1826 10/20/18 2030 10/20/18 2100 10/20/18 2130  BP: 100/62 112/65 (!) 119/59 107/77  Pulse: 65 69 69 72  Resp: 15  18 17   Temp:      TempSrc:      SpO2: 100% 100% 100% 97%  Weight:      Height:        Constitutional: NAD, calm, comfortable Vitals:   10/20/18 1826 10/20/18 2030 10/20/18 2100 10/20/18 2130  BP: 100/62 112/65 (!) 119/59 107/77  Pulse: 65 69 69 72  Resp: 15  18 17   Temp:      TempSrc:      SpO2: 100% 100% 100% 97%  Weight:      Height:       Eyes: PERRL, lids and conjunctivae normal ENMT: Mucous membranes are moist. Posterior pharynx clear of any exudate or lesions.Normal dentition.  Neck: normal, supple, no masses, no thyromegaly Respiratory: clear to auscultation bilaterally, no wheezing, no crackles. Normal respiratory effort. No accessory muscle use.  Cardiovascular: Regular rate and rhythm, no murmurs / rubs / gallops. No extremity edema. 2+ pedal pulses.  Abdomen: no tenderness, no masses palpated. No hepatosplenomegaly. Bowel sounds positive.  Musculoskeletal: no clubbing / cyanosis. No joint deformity upper and lower  extremities. Good ROM, no contractures. Normal muscle tone.  Skin:  no rashes, lesions, ulcers. No induration Neurologic: CN 2-12 grossly intact.  Strength 5/5 in all 4.  Psychiatric: Normal judgment and insight. Alert and oriented x 3. Normal mood.   Labs on Admission: I have personally reviewed following labs and imaging studies  CBC: Recent Labs  Lab 10/20/18 1814  WBC 8.3  HGB 11.9*  HCT 37.2  MCV 91.4  PLT 381   Basic Metabolic Panel: Recent Labs  Lab 10/20/18 1814  NA 137  K 2.6*  CL 103  CO2 23  GLUCOSE 113*  BUN 19  CREATININE 1.48*  CALCIUM 9.3   Liver Function Tests: Recent Labs  Lab 10/20/18 1814  AST 93*  ALT 108*  ALKPHOS 185*  BILITOT 4.4*  PROT 7.7  ALBUMIN 3.8   Recent Labs  Lab 10/20/18 1814  LIPASE 258*    Radiological Exams on Admission: Ct Abdomen Pelvis W Contrast  Result Date: 10/20/2018 CLINICAL DATA:  64 year old female with right upper quadrant abdominal pain. EXAM: CT ABDOMEN AND PELVIS WITH CONTRAST TECHNIQUE: Multidetector CT imaging of the abdomen and pelvis was performed using the standard protocol following bolus administration of intravenous contrast. CONTRAST:  43mL OMNIPAQUE IOHEXOL 300 MG/ML  SOLN COMPARISON:  Abdominal ultrasound dated 05/26/2018 FINDINGS: Lower chest: The visualized lung bases are clear. No intra-abdominal free air or free fluid. Hepatobiliary: Cirrhosis. Large amount of stone noted within the gallbladder. There is an area of thickening of the gallbladder wall with probable small pericholecystic fluid. There is apparent focal area of discontinuity in the superior gallbladder wall (series 6 image 40 and sagittal series 7, image 51) is likely artifactual and related to linear intraluminal sludge/stones. A focally contained perforation is favored less likely. Further evaluation with ultrasound is recommended. Pancreas: Unremarkable. No pancreatic ductal dilatation or surrounding inflammatory changes. Spleen: Normal in  size without focal abnormality. Adrenals/Urinary Tract: The adrenal glands are unremarkable. There is no hydronephrosis on either side. There is symmetric enhancement and excretion of contrast by both kidneys. The visualized ureters and urinary bladder appear unremarkable. Stomach/Bowel: There is no bowel obstruction or active inflammation. The appendix is normal. Vascular/Lymphatic: Mild aortoiliac atherosclerotic disease. No portal venous gas. There is no adenopathy. Reproductive: Small appearance of the uterus may represent partial hysterectomy or atrophic. No pelvic mass. Other: None Musculoskeletal: No acute or significant osseous findings. IMPRESSION: 1. Cholelithiasis with findings concerning for acute cholecystitis. Further evaluation with ultrasound is recommended. 2. Cirrhosis. 3. No bowel obstruction or active inflammation. Normal appendix. 4. No ascites. 5. Aortic Atherosclerosis (ICD10-I70.0). Electronically Signed   By: Anner Crete M.D.   On: 10/20/2018 20:33    EKG: Independently reviewed.  Sinus rhythm, appears to have borderline prolonged PR interval, with old left bundle branch block.  Assessment/Plan Principal Problem:   Acute cholecystitis    Acute Cholelithiasis with cholecystitis- RUQ pain, vomiting, low-grade fevers at home.  WBC 8.3 with elevated liver enzymes, AST 93, ALT 108, bilirubin 4.1, ALP 185,  raising suspicion for acute Choledocholelithiasis.  Mild elevated lipase 258, possible gallstone pancreatitis but imaging findings not consistent.  Patient rules out for sepsis. EDP talked to general surgery, will see in a.m. -Gastroenterology consult for possible choledocholithiasis -General surgery consult -Will start antibiotics with IV ceftriaxone and metronidazole -Clear liquid diets -N.p.o. midnight -CMP, CBC a.m. -Morphine 2 mg PRN -Hold home statins -IV famotidine 20mg  Q12h -Right upper quadrant ultrasound  Hypokalemia-potassium 2.6.  Likely secondary to  vomiting and Bumex -Check magnesium -Replete IV and p.o.  Alcoholic liver cirrhosis-denies alcohol  intake and 8 years.  CT with liver cirrhosis.  No obvious stigmata of liver disease.  Platelets normal 261.  Appears euvolemic. -Hold home Bumex and Spironolactone for now with vomiting and poor p.o. intake.  Psoriatic arthritis-stable -Continue home tofacitinib  CKD 3-stable with creatinine 1.48, baseline 1.1-1.3 -Hydrate, continue home losartan -Hold home Bumex  Depression-  -Continue home Wellbutrin, Lexapro.  HTN- -continue home losartan  HIV as part of routine health screen  DVT prophylaxis: SCDs, in case of procedure Code Status: Full Family Communication: None at bedside Disposition Plan: Per rounding team Consults called: Gastroenterology, general surgery Admission status: Observation, telemetry    [place] MD [facility]  10/20/2018, 10:31 PM

## 2018-10-20 NOTE — ED Provider Notes (Signed)
[facility] EMERGENCY DEPARTMENT Provider Note   CSN: [id number] Arrival date & time: 10/20/18  1555    History   Chief Complaint Chief Complaint  Patient presents with   Abdominal Pain    HPI  is a 63 y.o. female.     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  She has Sjogren's disease, hypertension, acid reflux, IBS and a history of B12 deficiency secondary to liver failure.  The patient reports that she has had multiple ultrasounds over time secondary to cirrhosis and has been told that she has multiple gallstones.  She also reports a prior history of incarcerated midline ventral hernias which required surgical repair in the past.  For the last several weeks the patient is described feeling under the weather with recurrent nausea vomiting and diarrhea with poor appetite and some abdominal bloating.  She has not been exposed to anybody was been sick, she has not been traveling, she has not been febrile though she did mention a temperature of 99.8 which occurred last week.  She did go to the doctor on Monday secondary to the symptoms, which again she reports is fluctuating with some days having no diarrhea and some days 2 or 3 episodes with occasional vomiting.  In the office the patient was given Zofran, prescription for the same, the patient was also sent for a COVID-19 test.  This was several days ago and she does not know the results.  She has not had any coughing, she has not felt short of breath, she continues to be fatigued and have intermittent nausea with occasional vomiting.  She reports acute onset of right upper quadrant pain which occurred several hours ago.  This was severe and incapacitating and after vomiting she states that the pain is started to improve but is still present.  It  is at a much lower level, it does not radiate to her back or her shoulder, it is not associated with any coughing or shortness of breath.  She feels bloated but this is not worsened.  She is having bowel movements and passing urine without any dysuria.  Past Medical History:  Diagnosis Date   Abdominal hernia    Abscess of pulp of tooth 12/12/14   left side   Anxiety    Arthritis    back  and down flank  right side more than left   B12 deficiency 06/12/2015   Brain aneurysm    METAL COIL PREVENTS MRI IMAGING and CLIP   Cirrhosis ([place])    Presumably alcoholic cirrhosis   Complication of anesthesia    Depression    ETOH abuse    Quit in 07/2011    Fatigue    due to Sjogren's disease   Folate deficiency 06/30/2016   GERD (gastroesophageal reflux disease)    HTN (hypertension)    no longer medicated, dated on 04/25/12   IBS (irritable bowel syndrome)    Iron deficiency anemia due to chronic blood loss    Myofascial pain    Panic attack    PONV (postoperative nausea and vomiting)    Psoriatic arthritis ([place])    Right inguinal hernia 08/16/2012   Seizures ([place]) 1997   1 seizure from coil insertion but no more seizures and no meds for seizures   Sjogren's disease ([place])    SUBLUXATION-RADIAL HEAD  02/03/2007   Qualifier: Diagnosis of  By: Aline Brochure MD, [place]     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[facility]     Patient Active Problem List   Diagnosis Date Noted   Abdominal pain 10/20/2018   Benign paroxysmal positional vertigo    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   Essential hypertension 07/31/2017   GERD (gastroesophageal reflux disease) 07/31/2017   Alcohol abuse 07/31/2017   Brain aneurysm 07/31/2017   CKD (chronic kidney disease), stage III ([place]) 07/31/2017   LBBB (left bundle branch block) 07/31/2017   Dizziness 07/31/2017   Folate deficiency 06/30/2016   Constipation 04/01/2016   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   Hemorrhoids, internal,  with bleeding 01/08/2016   B12 deficiency 06/12/2015   Psoriatic arthritis ([place]) 04/15/2015   Nutritional anemia 09/19/2014   Iron deficiency anemia due to chronic blood loss    Insomnia 03/17/2014   Depression 02/18/2014   Labial swelling 08/23/2012   Rash and nonspecific skin eruption 06/30/2012   Colon polyps 11/18/2011   Cirrhosis ([place]) 08/19/2011    Past Surgical History:  Procedure Laterality Date   AGILE CAPSULE N/A 06/21/2014   Procedure: AGILE CAPSULE;  Surgeon: Danie Binder, MD;  Location: [facility];  Service: Endoscopy;  Laterality: N/A;  0700   BIOPSY  10/13/2011   SLF: mild gastritis/duodenal polypoid lesion    CEREBRAL ANEURYSM REPAIR  1997   COLONOSCOPY  10/13/2011   UKG:URKYHCWC hemorrhoids/varices rectal/lesion in ascending colon(HYPERPLASTIC POLYP)   COLONOSCOPY N/A 01/17/2013   SLF: 1. Internal hemorrhoids 2. Ascending colon polyps 3.  Rectal varices.    COLONOSCOPY N/A 08/10/2016   Dr. Oneida Alar: redundant left colon. external and internal hemorhroids. 5-10 year surveillance   ESOPHAGOGASTRODUODENOSCOPY N/A 06/29/2014   SLF: 1. anemia due to polypoid lesions in teh antrum, and duodenal polyps 2. single duodenum AVM   FLEXIBLE SIGMOIDOSCOPY N/A 01/21/2016   Procedure: FLEXIBLE SIGMOIDOSCOPY;  Surgeon: Danie Binder, MD;  Location: [facility];  Service: Endoscopy;  Laterality: N/A;  [place] N/A 06/29/2014   Procedure: GIVENS CAPSULE STUDY;  Surgeon: Danie Binder, MD;  Location: [facility];  Service: Endoscopy;  Laterality: N/A;   GIVENS CAPSULE STUDY N/A 07/13/2014   Procedure: GIVENS CAPSULE STUDY;  Surgeon: Danie Binder, MD;  Location: [facility];  Service: Endoscopy;  Laterality: N/A;  [place] N/A 01/21/2016   Procedure: HEMORRHOID BANDING;  Surgeon: Danie Binder, MD;  Location: [facility];  Service: Endoscopy;  Laterality: N/A;   INGUINAL HERNIA REPAIR Right 08/28/2013   Procedure: RIGHT  NGUINAL HERNIORRHAPHY WITH MESH;  Surgeon: Jamesetta So, MD;  Location: [facility] ORS;  Service: General;  Laterality: Right;   INSERTION OF MESH N/A 06/09/2013   Procedure: INSERTION OF MESH;  Surgeon: Jamesetta So, MD;  Location: [facility] ORS;  Service: General;  Laterality: N/A;   INSERTION OF MESH Right 08/28/2013   Procedure: INSERTION OF MESH;  Surgeon: Jamesetta So, MD;  Location: [facility] ORS;  Service: General;  Laterality: Right;   PARACENTESIS  Feb 2014   10.7 liters   TOOTH EXTRACTION Left 3/76/28   UMBILICAL HERNIA REPAIR N/A 06/09/2013   Procedure: UMBILICAL HERNIORRHAPHY WITH MESH;  Surgeon: Jamesetta So, MD;  Location: [facility] ORS;  Service: General;  Laterality: N/A;     OB History    Gravida      Para      Term      Preterm  AB      Living  0     SAB      TAB      Ectopic      Multiple      Live Births               Home Medications    Prior to Admission medications   Medication Sig Start Date End Date Taking? Authorizing Provider  aspirin 325 MG tablet Take 325 mg by mouth daily.   Yes [provider]  atorvastatin (LIPITOR) 40 MG tablet TAKE (1) TABLET BY MOUTH ONCE DAILY 10/17/18  Yes Mikey Kirschner, MD  bumetanide (BUMEX) 1 MG tablet TAKE (1) TABLET BY MOUTH (2) TIMES DAILY. 03/04/18  Yes Annitta Needs, NP  buPROPion (WELLBUTRIN SR) 100 MG 12 hr tablet TAKE (1) TABLET BY MOUTH TWICE DAILY. 10/17/18  Yes Mikey Kirschner, MD  docusate sodium (COLACE) 100 MG capsule Take 100 mg by mouth daily.    Yes [provider]  escitalopram (LEXAPRO) 20 MG tablet TAKE (1) TABLET BY MOUTH AT BEDTIME. 10/17/18  Yes Mikey Kirschner, MD  famotidine (PEPCID) 20 MG tablet 1 PO BID MON-FRI 01/26/18  Yes Fields, Sandi L, MD  fluticasone (FLONASE) 50 MCG/ACT nasal spray Place 1 spray into both nostrils See admin instructions. Instill 1 spray into each nostril every morning, may also use later in the day as needed for sinus congestion   Yes [provider]  hydrOXYzine (ATARAX/VISTARIL) 25 MG tablet Take one tablet by mouth every 6 hrs prn itching 10/18/18  Yes Mikey Kirschner, MD  Lifitegrast [place]) 5 % SOLN Place 1 drop into both eyes 2 (two) times daily.   Yes [provider]  LINZESS 145 MCG CAPS capsule TAKE (1) CAPSULE BY MOUTH EVERY DAY BEFORE BREAKFAST. 03/28/18  Yes Annitta Needs, NP  loratadine (CLARITIN) 10 MG tablet Take 10 mg by mouth daily.   Yes [provider]  losartan (COZAAR) 25 MG tablet TAKE (1) TABLET BY MOUTH ONCE DAILY. 10/17/18  Yes Mikey Kirschner, MD  Multiple Vitamin (MULTIVITAMIN WITH MINERALS) TABS tablet Take 1 tablet by mouth daily.   Yes [provider]  ondansetron (ZOFRAN) 4 MG tablet Take 1 tablet (4 mg total) by mouth every 8 (eight) hours as needed for nausea or vomiting. 08/02/17  Yes Patrecia Pour, MD  Polyvinyl Alcohol-Povidone (REFRESH OP) Place 1 drop into both eyes 3 (three) times daily as needed (dry eyes).   Yes [provider]  Simethicone (GAS-X PO) Take 1 tablet by mouth 2 (two) times daily as needed (bloating/flatulence).   Yes [provider]  sodium chloride (OCEAN) 0.65 % nasal spray Place 1 spray into the nose 2 (two) times daily as needed for congestion.    Yes [provider]  spironolactone (ALDACTONE) 100 MG tablet TAKE (1) TABLET BY MOUTH TWICE DAILY. 10/18/18  Yes Mahala Menghini, PA-C  Tofacitinib Citrate ER (XELJANZ XR) 11 MG TB24 Take 1 tablet by mouth daily. 1/2 tab daily    Yes [provider]  meclizine (ANTIVERT) 25 MG tablet Take 1 tablet (25 mg total) by mouth 3 (three) times daily as needed for dizziness. Patient not taking: Reported on 05/23/2018 08/02/17   Patrecia Pour, MD    Family History Family History  Problem Relation Age of Onset   Cirrhosis Mother        nash   COPD Father    Heart disease  Father    Asthma Other    Heart disease Other    Colon cancer Neg Hx    Inflammatory bowel  disease Neg Hx     Social History Social History   Tobacco Use   Smoking status: Former Smoker    Packs/day: 2.00    Years: 12.00    Pack years: 24.00    Types: Cigarettes    Quit date: 03/31/1995    Years since quitting: 23.5   Smokeless tobacco: Never Used   Tobacco comment: quit in 1997  Substance Use Topics   Alcohol use: No    Comment: Bottle of wine a day for years, but quit in 07/2011   Drug use: No     Allergies   Omeprazole, Orencia [abatacept], Other, Phenytoin, Keflex [cephalexin], and Phenergan [promethazine hcl]   Review of Systems Review of Systems  All other systems reviewed and are negative.    Physical Exam Updated Vital Signs BP 107/77    Pulse 72    Temp 97.9 F (36.6 C) (Oral)    Resp 17    Ht 1.727 m (5\' 8" )    Wt 111.3 kg    SpO2 97%    BMI 37.31 kg/m   Physical Exam Vitals signs and nursing note reviewed.  Constitutional:      General: She is not in acute distress.    Appearance: She is well-developed.  HENT:     Head: Normocephalic and atraumatic.     Mouth/Throat:     Pharynx: No oropharyngeal exudate.  Eyes:     General: No scleral icterus.       Right eye: No discharge.        Left eye: No discharge.     Conjunctiva/sclera: Conjunctivae normal.     Pupils: Pupils are equal, round, and reactive to light.  Neck:     Musculoskeletal: Normal range of motion and neck supple.     Thyroid: No thyromegaly.     Vascular: No JVD.  Cardiovascular:     Rate and Rhythm: Normal rate and regular rhythm.     Heart sounds: Normal heart sounds. No murmur. No friction rub. No gallop.   Pulmonary:     Effort: Pulmonary effort is normal. No respiratory distress.     Breath sounds: Normal breath sounds. No wheezing or rales.  Abdominal:     General: Bowel sounds are normal. There is no distension.     Palpations: Abdomen is soft. There is no mass.     Tenderness: There is abdominal tenderness.     Comments: The patient has a very soft  abdomen, there is no distention or fluid wave, there is no anasarca, there is no tympanitic sounds to percussion and she has absolutely no tenderness in the abdomen except for a small amount in the epigastric and right upper quadrant.  There is no Murphy sign, no pain at McBurney's point, no guarding or peritoneal signs, no cough pain.  Musculoskeletal: Normal range of motion.        General: No tenderness.  Lymphadenopathy:     Cervical: No cervical adenopathy.  Skin:    General: Skin is warm and dry.     Findings: No erythema or rash.  Neurological:     Mental Status: She is alert.     Coordination: Coordination normal.  Psychiatric:        Behavior: Behavior normal.      ED Treatments / Results  Labs (all labs ordered are listed,  but only abnormal results are displayed) Labs Reviewed  LIPASE, BLOOD - Abnormal; Notable for the following components:      Result Value   Lipase 258 (*)    All other components within normal limits  COMPREHENSIVE METABOLIC PANEL - Abnormal; Notable for the following components:   Potassium 2.6 (*)    Glucose, Bld 113 (*)    Creatinine, Ser 1.48 (*)    AST 93 (*)    ALT 108 (*)    Alkaline Phosphatase 185 (*)    Total Bilirubin 4.4 (*)    GFR calc non Af Amer 38 (*)    GFR calc Af Amer 44 (*)    All other components within normal limits  CBC - Abnormal; Notable for the following components:   Hemoglobin 11.9 (*)    RDW 19.4 (*)    All other components within normal limits  SARS CORONAVIRUS 2 (HOSPITAL ORDER, [place] [facility])  URINALYSIS, ROUTINE W REFLEX MICROSCOPIC  MAGNESIUM    EKG EKG Interpretation  Date/Time:  Thursday October 20 2018 19:48:37 EDT Ventricular Rate:  68 PR Interval:    QRS Duration: 165 QT Interval:  455 QTC Calculation: 484 R Axis:   12 Text Interpretation:  Sinus rhythm Prolonged PR interval Left bundle branch block Left bundle branch block seen on prior EKG, no other acute changes Confirmed  by Noemi Chapel [id number]) on 10/20/2018 8:01:08 PM   Radiology Ct Abdomen Pelvis W Contrast  Result Date: 10/20/2018 CLINICAL DATA:  63 year old female with right upper quadrant abdominal pain. EXAM: CT ABDOMEN AND PELVIS WITH CONTRAST TECHNIQUE: Multidetector CT imaging of the abdomen and pelvis was performed using the standard protocol following bolus administration of intravenous contrast. CONTRAST:  70mL OMNIPAQUE IOHEXOL 300 MG/ML  SOLN COMPARISON:  Abdominal ultrasound dated 05/26/2018 FINDINGS: Lower chest: The visualized lung bases are clear. No intra-abdominal free air or free fluid. Hepatobiliary: Cirrhosis. Large amount of stone noted within the gallbladder. There is an area of thickening of the gallbladder wall with probable small pericholecystic fluid. There is apparent focal area of discontinuity in the superior gallbladder wall (series 6 image 40 and sagittal series 7, image 51) is likely artifactual and related to linear intraluminal sludge/stones. A focally contained perforation is favored less likely. Further evaluation with ultrasound is recommended. Pancreas: Unremarkable. No pancreatic ductal dilatation or surrounding inflammatory changes. Spleen: Normal in size without focal abnormality. Adrenals/Urinary Tract: The adrenal glands are unremarkable. There is no hydronephrosis on either side. There is symmetric enhancement and excretion of contrast by both kidneys. The visualized ureters and urinary bladder appear unremarkable. Stomach/Bowel: There is no bowel obstruction or active inflammation. The appendix is normal. Vascular/Lymphatic: Mild aortoiliac atherosclerotic disease. No portal venous gas. There is no adenopathy. Reproductive: Small appearance of the uterus may represent partial hysterectomy or atrophic. No pelvic mass. Other: None Musculoskeletal: No acute or significant osseous findings. IMPRESSION: 1. Cholelithiasis with findings concerning for acute cholecystitis. Further  evaluation with ultrasound is recommended. 2. Cirrhosis. 3. No bowel obstruction or active inflammation. Normal appendix. 4. No ascites. 5. Aortic Atherosclerosis (ICD10-I70.0). Electronically Signed   By: Anner Crete M.D.   On: 10/20/2018 20:33    Procedures Procedures (including critical care time)  Medications Ordered in ED Medications  potassium chloride 10 mEq in 100 mL IVPB (has no administration in time range)  sodium chloride flush (NS) 0.9 % injection 3 mL (3 mLs Intravenous Given 10/20/18 1826)  metoCLOPramide (REGLAN) injection 10 mg (10 mg Intravenous  Given 10/20/18 1824)  sodium chloride 0.9 % bolus 1,000 mL (0 mLs Intravenous Stopped 10/20/18 2109)  magnesium sulfate IVPB 2 g 50 mL (0 g Intravenous Stopped 10/20/18 2147)  potassium chloride 10 mEq in 100 mL IVPB (0 mEq Intravenous Stopped 10/20/18 2201)  iohexol (OMNIPAQUE) 300 MG/ML solution 75 mL (75 mLs Intravenous Contrast Given 10/20/18 2000)     Initial Impression / Assessment and Plan / ED Course  I have reviewed the triage vital signs and the nursing notes.  Pertinent labs & imaging results that were available during my care of the patient were reviewed by me and considered in my medical decision making (see chart for details).  Clinical Course as of Oct 20 2207  Thu Oct 20, 2018  1959 Today the patient's bilirubin is 4.4 which is significantly elevated and suggest some type of obstructive biliary process.  Her LFTs were elevated in the 100 range, her potassium was 2.6 which is being replaced with both magnesium and potassium.  Creatinine is 1.48 which is slightly higher than prior.  There is no leukocytosis, no anemia, no thrombocytopenia, her lipase is 258 suggestive of an acute pancreatitis.   [BM]  2106 I discussed the case with Dr. Arnoldo Morale of general surgery who will consult in the morning though this likely represents gallstone pancreatitis.  GI has been paged.   [BM]    Clinical Course User Index [BM]  Noemi Chapel, MD       The patient is not ill-appearing but does have some focal abdominal pain with a known history of gallstones.  It is improving and she does not have a Murphy sign.  Labs ordered, ultrasound not available at this hour thus a CT scan will be ordered.  N.p.o. status established.  She has been drinking Dr. Malachi Bonds since she has dry mouth secondary to Sjogren's disease.  I have asked her to stop drinking this at this time.  I would consider pancreatitis, gallbladder disease, acute cholecystitis, unlikely to be a pulmonary process.  This could be a retained stone, could be related to some type of small bowel enteritis, large bowel colitis, diverticulitis though the latter seem less likely.  D/w hosptialist - will admit  Final Clinical Impressions(s) / ED Diagnoses   Final diagnoses:  Gall stone pancreatitis    ED Discharge Orders    None       Noemi Chapel, MD 10/20/18 2209

## 2018-10-21 ENCOUNTER — Observation Stay (HOSPITAL_COMMUNITY): Payer: Commercial Managed Care - PPO

## 2018-10-21 DIAGNOSIS — K851 Biliary acute pancreatitis without necrosis or infection: Secondary | ICD-10-CM | POA: Diagnosis not present

## 2018-10-21 DIAGNOSIS — R1110 Vomiting, unspecified: Secondary | ICD-10-CM

## 2018-10-21 DIAGNOSIS — K7030 Alcoholic cirrhosis of liver without ascites: Secondary | ICD-10-CM

## 2018-10-21 DIAGNOSIS — K810 Acute cholecystitis: Secondary | ICD-10-CM | POA: Diagnosis not present

## 2018-10-21 DIAGNOSIS — R748 Abnormal levels of other serum enzymes: Secondary | ICD-10-CM

## 2018-10-21 DIAGNOSIS — R112 Nausea with vomiting, unspecified: Secondary | ICD-10-CM | POA: Diagnosis not present

## 2018-10-21 DIAGNOSIS — Z789 Other specified health status: Secondary | ICD-10-CM

## 2018-10-21 LAB — COMPREHENSIVE METABOLIC PANEL
ALT: 91 U/L — ABNORMAL HIGH (ref 0–44)
AST: 77 U/L — ABNORMAL HIGH (ref 15–41)
Albumin: 3.2 g/dL — ABNORMAL LOW (ref 3.5–5.0)
Alkaline Phosphatase: 166 U/L — ABNORMAL HIGH (ref 38–126)
Anion gap: 9 (ref 5–15)
BUN: 15 mg/dL (ref 8–23)
CO2: 23 mmol/L (ref 22–32)
Calcium: 8.7 mg/dL — ABNORMAL LOW (ref 8.9–10.3)
Chloride: 106 mmol/L (ref 98–111)
Creatinine, Ser: 1.01 mg/dL — ABNORMAL HIGH (ref 0.44–1.00)
GFR calc Af Amer: >60 mL/min (ref >60)
GFR calc non Af Amer: >60 mL/min (ref >60)
Glucose, Bld: 136 mg/dL — ABNORMAL HIGH (ref 70–99)
Potassium: 3.6 mmol/L (ref 3.5–5.1)
Sodium: 138 mmol/L (ref 135–145)
Total Bilirubin: 3.8 mg/dL — ABNORMAL HIGH (ref 0.3–1.2)
Total Protein: 6.7 g/dL (ref 6.5–8.1)

## 2018-10-21 LAB — URINALYSIS, ROUTINE W REFLEX MICROSCOPIC
Glucose, UA: NEGATIVE mg/dL
Hgb urine dipstick: NEGATIVE
Ketones, ur: 5 mg/dL — AB
Leukocytes,Ua: NEGATIVE
Nitrite: NEGATIVE
Protein, ur: NEGATIVE mg/dL
Specific Gravity, Urine: >1.046 — ABNORMAL HIGH (ref 1.005–1.030)
pH: 5 (ref 5.0–8.0)

## 2018-10-21 LAB — CBC
HCT: 34.2 % — ABNORMAL LOW (ref 36.0–46.0)
Hemoglobin: 11 g/dL — ABNORMAL LOW (ref 12.0–15.0)
MCH: 29.5 pg (ref 26.0–34.0)
MCHC: 32.2 g/dL (ref 30.0–36.0)
MCV: 91.7 fL (ref 80.0–100.0)
Platelets: 186 10*3/uL (ref 150–400)
RBC: 3.73 MIL/uL — ABNORMAL LOW (ref 3.87–5.11)
RDW: 19.7 % — ABNORMAL HIGH (ref 11.5–15.5)
WBC: 7.4 10*3/uL (ref 4.0–10.5)
nRBC: 0 % (ref 0.0–0.2)

## 2018-10-21 LAB — PROTIME-INR
INR: 1.1 (ref 0.8–1.2)
Prothrombin Time: 14.1 seconds (ref 11.4–15.2)

## 2018-10-21 MED ORDER — POLYVINYL ALCOHOL 1.4 % OP SOLN: 1.0000 [drp] | Freq: Two times a day (BID) | OPHTHALMIC | Status: DC

## 2018-10-21 MED ORDER — FAMOTIDINE 20 MG PO TABS
20.0000 mg | ORAL_TABLET | Freq: Two times a day (BID) | ORAL | Status: DC
  Administered 2018-10-21 – 2018-10-22 (×2): 20 mg via ORAL
  Filled 2018-10-21 (×2): qty 1

## 2018-10-21 MED ORDER — ALUM & MAG HYDROXIDE-SIMETH 200-200-20 MG/5ML PO SUSP
30.0000 mL | ORAL | Status: DC | PRN
  Administered 2018-10-21 (×2): 30 mL via ORAL
  Filled 2018-10-21 (×2): qty 30

## 2018-10-21 MED ORDER — HYDRALAZINE HCL 20 MG/ML IJ SOLN: 10.0000 mg | Freq: Four times a day (QID) | INTRAMUSCULAR | Status: DC | PRN

## 2018-10-21 NOTE — Progress Notes (Signed)
Reviewed her upper quadrant ultrasound.  No cirrhosis again documented on imaging.  Gallbladder stones/sludge.  Common bile duct normal at 5 mm.  No obvious intraductal abnormality or stones noted.  Given results, no need for ERCP at this time.  Not a candidate for MRCP given brain aneurysm clips.  See consult note for further recommendations.  Thank you for allowing [place] to participate in the care of Louann, [place], AGNP-C Adult & Gerontological Nurse Practitioner [facility]

## 2018-10-21 NOTE — Consult Note (Signed)
Reason for Consult: Gallstone pancreatitis Referring Physician: Dr. Blima Rich Deanna Thompson is an 63 y.o. female.  HPI: Patient is a 63 year old white female with multiple medical problems including cirrhosis and known history of cholelithiasis who presents with a two-week history of intermittent worsening right upper quadrant abdominal pain, nausea, and vomiting.  She also noted that her urine seemed to be a darker orange.  She does admit to having some fevers intermittently during that time.  She presented to the emergency room and was found to have elevated liver enzyme tests as well as an elevated lipase.  CT scan of the abdomen revealed cholecystitis, cholelithiasis.  She was admitted to the hospital for treatment of gallstone pancreatitis.  This morning, patient states that she still has a little bit of an upset stomach.  Right upper quadrant abdominal pain is not as pronounced.  She currently rates her pain as 2 out of 10.  She is followed by Dr. Oneida Alar of gastroenterology.  Past Medical History:  Diagnosis Date  . Abdominal hernia   . Abscess of pulp of tooth 12/12/14   left side  . Anxiety   . Arthritis    back  and down flank  right side more than left  . B12 deficiency 06/12/2015  . Brain aneurysm    METAL COIL PREVENTS MRI IMAGING and CLIP  . Cirrhosis ([place])    Presumably alcoholic cirrhosis  . Complication of anesthesia   . Depression   . ETOH abuse    Quit in 07/2011   . Fatigue    due to Sjogren's disease  . Folate deficiency 06/30/2016  . GERD (gastroesophageal reflux disease)   . HTN (hypertension)    no longer medicated, dated on 04/25/12  . IBS (irritable bowel syndrome)   . Iron deficiency anemia due to chronic blood loss   . Myofascial pain   . Panic attack   . PONV (postoperative nausea and vomiting)   . Psoriatic arthritis ([place])   . Right inguinal hernia 08/16/2012  . Seizures ([place]) 1997   1 seizure from coil insertion but no more seizures and no meds for  seizures  . Sjogren's disease ([place])   . [place] HEAD 02/03/2007   Qualifier: Diagnosis of  By: Aline Brochure MD, Deanna Thompson    . 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([place])     Past Surgical History:  Procedure Laterality Date  . AGILE CAPSULE N/A 06/21/2014   Procedure: AGILE CAPSULE;  Surgeon: Danie Binder, MD;  [facility];  Service: Endoscopy;  Laterality: N/A;  0700  . BIOPSY  10/13/2011   SLF: mild gastritis/duodenal polypoid lesion   . CEREBRAL ANEURYSM REPAIR  1997  . COLONOSCOPY  10/13/2011   VOZ:DGUYQIHK hemorrhoids/varices rectal/lesion in ascending colon(HYPERPLASTIC POLYP)  . COLONOSCOPY N/A 01/17/2013   SLF: 1. Internal hemorrhoids 2. Ascending colon polyps 3.  Rectal varices.   . COLONOSCOPY N/A 08/10/2016   Dr. Oneida Alar: redundant left colon. external and internal hemorhroids. 5-10 year surveillance  . ESOPHAGOGASTRODUODENOSCOPY N/A 06/29/2014   SLF: 1. anemia due to polypoid lesions in teh antrum, and duodenal polyps 2. single duodenum AVM  . FLEXIBLE SIGMOIDOSCOPY N/A 01/21/2016   Procedure: FLEXIBLE SIGMOIDOSCOPY;  Surgeon: Danie Binder, MD;  [facility];  Service: Endoscopy;  Laterality: N/A;  245  . GIVENS CAPSULE STUDY N/A 06/29/2014   Procedure: GIVENS CAPSULE STUDY;  Surgeon: Danie Binder, MD;  [facility];  Service: Endoscopy;  Laterality: N/A;  . GIVENS CAPSULE STUDY  N/A 07/13/2014   Procedure: GIVENS CAPSULE STUDY;  Surgeon: Danie Binder, MD;  [facility];  Service: Endoscopy;  Laterality: N/A;  700  . HEMORRHOID BANDING N/A 01/21/2016   Procedure: HEMORRHOID BANDING;  Surgeon: Danie Binder, MD;  [facility];  Service: Endoscopy;  Laterality: N/A;  . INGUINAL HERNIA REPAIR Right 08/28/2013   Procedure: RIGHT NGUINAL HERNIORRHAPHY WITH MESH;  Surgeon: Jamesetta So, MD;  Location: [facility];  Service: General;  Laterality: Right;  . INSERTION OF MESH N/A 06/09/2013   Procedure: INSERTION OF MESH;   Surgeon: Jamesetta So, MD;  Location: [facility];  Service: General;  Laterality: N/A;  . INSERTION OF MESH Right 08/28/2013   Procedure: INSERTION OF MESH;  Surgeon: Jamesetta So, MD;  Location: [facility];  Service: General;  Laterality: Right;  . PARACENTESIS  Feb 2014   10.7 liters  . TOOTH EXTRACTION Left 12/12/14  . UMBILICAL HERNIA REPAIR N/A 06/09/2013   Procedure: UMBILICAL HERNIORRHAPHY WITH MESH;  Surgeon: Jamesetta So, MD;  Location: [facility];  Service: General;  Laterality: N/A;    Family History  Problem Relation Age of Onset  . Cirrhosis Mother        nash  . COPD Father   . Heart disease Father   . Asthma Other   . Heart disease Other   . Colon cancer Neg Hx   . Inflammatory bowel disease Neg Hx     Social History:  reports that she quit smoking about 23 years ago. Her smoking use included cigarettes. She has a 24.00 pack-year smoking history. She has never used smokeless tobacco. She reports that she does not drink alcohol or use drugs.  Allergies:  Allergies  Allergen Reactions  . Omeprazole Shortness Of Breath  . Orencia [Abatacept] Anaphylaxis, Hives and Rash  . Other Shortness Of Breath and Rash    Red Meat Causes shortness of breath, rash and flu like symptoms.   . Phenytoin Rash  . Keflex [Cephalexin] Other (See Comments)    Severe yeast infection  . Phenergan [Promethazine Hcl] Other (See Comments)    Not in right state of mind, HALLUCINATION    Medications: I have reviewed the patient's current medications.  Results for orders placed or performed during the hospital encounter of 10/20/18 (from the past 48 hour(s))  Lipase, blood     Status: Abnormal   Collection Time: 10/20/18  6:14 PM  Result Value Ref Range   Lipase 258 (H) 11 - 51 U/L    Comment: Performed at [facility], [address] [place], [place] [address]  Comprehensive metabolic panel     Status: Abnormal   Collection Time: 10/20/18  6:14 PM  Result Value Ref Range   Sodium 137 135 - 145  mmol/L   Potassium 2.6 (LL) 3.5 - 5.1 mmol/L    Comment: CRITICAL RESULT CALLED TO, READ BACK BY AND VERIFIED WITH: PRUITT,G ON 10/20/18 AT 1920 BY LOY,C    Chloride 103 98 - 111 mmol/L   CO2 23 22 - 32 mmol/L   Glucose, Bld 113 (H) 70 - 99 mg/dL   BUN 19 8 - 23 mg/dL   Creatinine, Ser 1.48 (H) 0.44 - 1.00 mg/dL   Calcium 9.3 8.9 - 10.3 mg/dL   Total Protein 7.7 6.5 - 8.1 g/dL   Albumin 3.8 3.5 - 5.0 g/dL   AST 93 (H) 15 - 41 U/L   ALT 108 (H) 0 - 44 U/L  Alkaline Phosphatase 185 (H) 38 - 126 U/L   Total Bilirubin 4.4 (H) 0.3 - 1.2 mg/dL   GFR calc non Af Amer 38 (L) >60 mL/min   GFR calc Af Amer 44 (L) >60 mL/min   Anion gap 11 5 - 15    Comment: Performed at [facility], [address], [place], [place] [address]  CBC     Status: Abnormal   Collection Time: 10/20/18  6:14 PM  Result Value Ref Range   WBC 8.3 4.0 - 10.5 K/uL   RBC 4.07 3.87 - 5.11 MIL/uL   Hemoglobin 11.9 (L) 12.0 - 15.0 g/dL   HCT 37.2 36.0 - 46.0 %   MCV 91.4 80.0 - 100.0 fL   MCH 29.2 26.0 - 34.0 pg   MCHC 32.0 30.0 - 36.0 g/dL   RDW 19.4 (H) 11.5 - 15.5 %   Platelets 261 150 - 400 K/uL   nRBC 0.0 0.0 - 0.2 %    Comment: Performed at [facility], [address] [place], [place] [address]  Magnesium     Status: None   Collection Time: 10/20/18  6:14 PM  Result Value Ref Range   Magnesium 2.0 1.7 - 2.4 mg/dL    Comment: Performed at [facility], [address] [place], [place] [address]  SARS Coronavirus 2 ([place] - Performed in [facility] hospital lab), Hosp Order     Status: None   Collection Time: 10/20/18  9:10 PM   Specimen: Nasopharyngeal Swab  Result Value Ref Range   SARS Coronavirus 2 NEGATIVE NEGATIVE    Comment: (NOTE) If result is NEGATIVE SARS-CoV-2 target nucleic acids are NOT DETECTED. The SARS-CoV-2 RNA is generally detectable in upper and lower  respiratory specimens during the acute phase of infection. The lowest  concentration of SARS-CoV-2 viral copies this assay can detect  is 250  copies / mL. A negative result does not preclude SARS-CoV-2 infection  and should not be used as the sole basis for treatment or other  patient management decisions.  A negative result may occur with  improper specimen collection / handling, submission of specimen other  than nasopharyngeal swab, presence of viral mutation(s) within the  areas targeted by this assay, and inadequate number of viral copies  (<250 copies / mL). A negative result must be combined with clinical  observations, patient history, and epidemiological information. If result is POSITIVE SARS-CoV-2 target nucleic acids are DETECTED. The SARS-CoV-2 RNA is generally detectable in upper and lower  respiratory specimens dur ing the acute phase of infection.  Positive  results are indicative of active infection with SARS-CoV-2.  Clinical  correlation with patient history and other diagnostic information is  necessary to determine patient infection status.  Positive results do  not rule out bacterial infection or co-infection with other viruses. If result is PRESUMPTIVE POSTIVE SARS-CoV-2 nucleic acids MAY BE PRESENT.   A presumptive positive result was obtained on the submitted specimen  and confirmed on repeat testing.  While 2019 novel coronavirus  (SARS-CoV-2) nucleic acids may be present in the submitted sample  additional confirmatory testing may be necessary for epidemiological  and / or clinical management purposes  to differentiate between  SARS-CoV-2 and other Sarbecovirus currently known to infect humans.  If clinically indicated additional testing with an alternate test  methodology [id number]) is advised. The SARS-CoV-2 RNA is generally  detectable in upper and lower respiratory sp ecimens during the acute  phase of infection. The expected result is Negative. Fact  Sheet for Patients:  StrictlyIdeas.no Fact Sheet for Healthcare  Providers: BankingDealers.co.za This test is not yet approved or cleared by the [place] FDA and has been authorized for detection and/or diagnosis of SARS-CoV-2 by FDA under an Emergency Use Authorization (EUA).  This EUA will remain in effect (meaning this test can be used) for the duration of the COVID-19 declaration under Section 564(b)(1) of the Act, 21 U.S.C. section 360bbb-3(b)(1), unless the authorization is terminated or revoked sooner. Performed at [facility], [address] [place], [place] [address]   Urinalysis, Routine w reflex microscopic     Status: Abnormal   Collection Time: 10/21/18 12:10 AM  Result Value Ref Range   Color, Urine AMBER (A) YELLOW    Comment: BIOCHEMICALS MAY BE AFFECTED BY COLOR   APPearance CLEAR CLEAR   Specific Gravity, Urine >1.046 (H) 1.005 - 1.030   pH 5.0 5.0 - 8.0   Glucose, UA NEGATIVE NEGATIVE mg/dL   Hgb urine dipstick NEGATIVE NEGATIVE   Bilirubin Urine MODERATE (A) NEGATIVE   Ketones, ur 5 (A) NEGATIVE mg/dL   Protein, ur NEGATIVE NEGATIVE mg/dL   Nitrite NEGATIVE NEGATIVE   Leukocytes,Ua NEGATIVE NEGATIVE    Comment: Performed at [facility], [address] [place], [place] [address]  CBC     Status: Abnormal   Collection Time: 10/21/18  5:44 AM  Result Value Ref Range   WBC 7.4 4.0 - 10.5 K/uL   RBC 3.73 (L) 3.87 - 5.11 MIL/uL   Hemoglobin 11.0 (L) 12.0 - 15.0 g/dL   HCT 34.2 (L) 36.0 - 46.0 %   MCV 91.7 80.0 - 100.0 fL   MCH 29.5 26.0 - 34.0 pg   MCHC 32.2 30.0 - 36.0 g/dL   RDW 19.7 (H) 11.5 - 15.5 %   Platelets 186 150 - 400 K/uL   nRBC 0.0 0.0 - 0.2 %    Comment: Performed at [facility], [address] [place], [place] [address]  Comprehensive metabolic panel     Status: Abnormal   Collection Time: 10/21/18  5:44 AM  Result Value Ref Range   Sodium 138 135 - 145 mmol/L   Potassium 3.6 3.5 - 5.1 mmol/L    Comment: DELTA CHECK NOTED   Chloride 106 98 - 111 mmol/L   CO2 23 22 - 32 mmol/L    Glucose, Bld 136 (H) 70 - 99 mg/dL   BUN 15 8 - 23 mg/dL   Creatinine, Ser 1.01 (H) 0.44 - 1.00 mg/dL   Calcium 8.7 (L) 8.9 - 10.3 mg/dL   Total Protein 6.7 6.5 - 8.1 g/dL   Albumin 3.2 (L) 3.5 - 5.0 g/dL   AST 77 (H) 15 - 41 U/L   ALT 91 (H) 0 - 44 U/L   Alkaline Phosphatase 166 (H) 38 - 126 U/L   Total Bilirubin 3.8 (H) 0.3 - 1.2 mg/dL   GFR calc non Af Amer >60 >60 mL/min   GFR calc Af Amer >60 >60 mL/min   Anion gap 9 5 - 15    Comment: Performed at [facility], [address], [place], [place] [address]    Ct Abdomen Pelvis W Contrast  Result Date: 10/20/2018 CLINICAL DATA:  63 year old female with right upper quadrant abdominal pain. EXAM: CT ABDOMEN AND PELVIS WITH CONTRAST TECHNIQUE: Multidetector CT imaging of the abdomen and pelvis was performed using the standard protocol following bolus administration of intravenous contrast. CONTRAST:  21mL OMNIPAQUE IOHEXOL 300 MG/ML  SOLN COMPARISON:  Abdominal ultrasound dated  05/26/2018 FINDINGS: Lower chest: The visualized lung bases are clear. No intra-abdominal free air or free fluid. Hepatobiliary: Cirrhosis. Large amount of stone noted within the gallbladder. There is an area of thickening of the gallbladder wall with probable small pericholecystic fluid. There is apparent focal area of discontinuity in the superior gallbladder wall (series 6 image 40 and sagittal series 7, image 51) is likely artifactual and related to linear intraluminal sludge/stones. A focally contained perforation is favored less likely. Further evaluation with ultrasound is recommended. Pancreas: Unremarkable. No pancreatic ductal dilatation or surrounding inflammatory changes. Spleen: Normal in size without focal abnormality. Adrenals/Urinary Tract: The adrenal glands are unremarkable. There is no hydronephrosis on either side. There is symmetric enhancement and excretion of contrast by both kidneys. The visualized ureters and urinary bladder appear unremarkable.  Stomach/Bowel: There is no bowel obstruction or active inflammation. The appendix is normal. Vascular/Lymphatic: Mild aortoiliac atherosclerotic disease. No portal venous gas. There is no adenopathy. Reproductive: Small appearance of the uterus may represent partial hysterectomy or atrophic. No pelvic mass. Other: None Musculoskeletal: No acute or significant osseous findings. IMPRESSION: 1. Cholelithiasis with findings concerning for acute cholecystitis. Further evaluation with ultrasound is recommended. 2. Cirrhosis. 3. No bowel obstruction or active inflammation. Normal appendix. 4. No ascites. 5. Aortic Atherosclerosis (ICD10-I70.0). Electronically Signed   By: Anner Crete M.D.   On: 10/20/2018 20:33    ROS:  Pertinent items are noted in HPI.  Blood pressure 130/61, pulse 68, temperature 99.2 F (37.3 C), temperature source Oral, resp. rate 18, height 5\' 8"  (1.727 m), weight 111.1 kg, SpO2 100 %. Physical Exam: Pleasant obese white female no acute distress. Skin examination reveals an orange hue to it Head is normocephalic, atraumatic Eyes with mild scleral icterus Lungs clear to auscultation with equal breath sounds bilaterally Heart examination reveals regular rate and rhythm without S3, S4, murmurs Abdomen is soft, nontender, nondistended.  No hepatosplenomegaly, masses, hernias are appreciated, though examination is limited secondary to body habitus.  Labs, old records, and CT scanning reports reviewed  Assessment/Plan: Impression: Gallstone pancreatitis, question choledocholithiasis.  History of cirrhosis and mild cardiac dysfunction. Plan: We will need cholecystectomy once her work-up of choledocholithiasis is done.  Ultrasound pending.  Patient cannot have an MRCP due to a metal clipping of a brain aneurysm.  Awaiting input from gastroenterology.  Deanna Thompson Signs 10/21/2018, 8:46 AM

## 2018-10-21 NOTE — Plan of Care (Signed)
  Problem: Education: Goal: Knowledge of General Education information will improve Description Including pain rating scale, medication(s)/side effects and non-pharmacologic comfort measures Outcome: Progressing   Problem: Health Behavior/Discharge Planning: Goal: Ability to manage health-related needs will improve Outcome: Progressing   

## 2018-10-21 NOTE — Consult Note (Addendum)
Referring Provider: [facility] Primary Care Physician:  Deanna Kirschner, MD Primary Gastroenterologist:  Dr. Oneida Alar  Date of Admission: 10/20/2018 Date of Consultation: 10/21/2018  Reason for Consultation:  Cholelithiasis/cholecystitis, elevated LFTs  HPI:  Deanna Thompson is a 63 y.o. female with a past medical history of anxiety, brain aneurysm status post coiling, cirrhosis, alcohol abuse (abstinent since 2013), fatigue, GERD, IBS, iron deficiency anemia, seizures, psoriatic arthritis, Sjogren's disease.  She is known to our practice and was last seen 40/98/1191 for alcoholic cirrhosis, GERD, constipation.  At that time her GERD was fairly well controlled and switched from Zantac to Pepcid due to national recall.  Constipation not ideally controlled and recommended continue Linzess and Colace, high-fiber diet.  Her cirrhosis is found to be well compensated disease and recommended continue Lasix and Aldactone at previous dosages, follow-up in 6 months.  At that time her hepatic function panel was normal, CBC with history of iron deficiency anemia and at that time her hemoglobin was 7.5 with recommendation for infusion the following week.  Additionally, platelet count normal at 271.  Unfortunately the patient presented to the ED with complaints of intermittent vomiting, loose stools over the previous 2 weeks as well as sudden onset right upper quadrant pain not related to oral intake.  Fever at home with a T-max of 99.7.  No recent alcohol.  Her vitals are stable.  CMP with hypo-kalemia and new/sudden elevation of pan liver enzymes with AST/ALT at 93/108, alkaline phosphatase at 185, total bilirubin at 4.4.  Her creatinine was mildly elevated above baseline at 1.48.  Her CBC revealed no leukocytosis, hemoglobin within baseline, platelets remain normal.  Lipase with acute elevation at 258.  COVID-19 testing negative.  Labs today show improvement in LFTs with decline across-the-board with  AST/ALT at 77/91, alk phos at 166, total bilirubin at 3.8.  Ultrasound ordered, not yet completed.  CT imaging noted cholelithiasis with findings concerning for acute cholecystitis and recommended ultrasound, known cirrhosis, no ascites, no other acute process.  Surgery has been consulted.  Today she states she's feeling much better today. "Something happened yesterday where the abdominal pain just kinda went away." States she was told yesterday she did have some mild yellowing of her skin.  She noted she did have some gray stools the other day.  She still having some intermittent nausea, but no vomiting.  She complains of intermittent GERD in the past 2 weeks.  Denies hematochezia, melena.  No other GI complaints at this time.  Overall feels improved. Is asking to be able to drink/sip fluids due to Sjogren's syndrome and associated dry mouth.  Past Medical History:  Diagnosis Date  . Abdominal hernia   . Abscess of pulp of tooth 12/12/14   left side  . Anxiety   . Arthritis    back  and down flank  right side more than left  . B12 deficiency 06/12/2015  . Brain aneurysm    METAL COIL PREVENTS MRI IMAGING and CLIP  . Cirrhosis ([place])    Presumably alcoholic cirrhosis  . Complication of anesthesia   . Depression   . ETOH abuse    Quit in 07/2011   . Fatigue    due to Sjogren's disease  . Folate deficiency 06/30/2016  . GERD (gastroesophageal reflux disease)   . HTN (hypertension)    no longer medicated, dated on 04/25/12  . IBS (irritable bowel syndrome)   . Iron deficiency anemia due to chronic blood loss   . Myofascial pain   .  Panic attack   . PONV (postoperative nausea and vomiting)   . Psoriatic arthritis ([place])   . Right inguinal hernia 08/16/2012  . Seizures ([place]) 1997   1 seizure from coil insertion but no more seizures and no meds for seizures  . Sjogren's disease ([place])   . [place] HEAD 02/03/2007   Qualifier: Diagnosis of  By: Aline Brochure MD, Dorothyann Peng    .  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([place])     Past Surgical History:  Procedure Laterality Date  . AGILE CAPSULE N/A 06/21/2014   Procedure: AGILE CAPSULE;  Surgeon: Danie Binder, MD;  [facility];  Service: Endoscopy;  Laterality: N/A;  0700  . BIOPSY  10/13/2011   SLF: mild gastritis/duodenal polypoid lesion   . CEREBRAL ANEURYSM REPAIR  1997  . COLONOSCOPY  10/13/2011   UKG:URKYHCWC hemorrhoids/varices rectal/lesion in ascending colon(HYPERPLASTIC POLYP)  . COLONOSCOPY N/A 01/17/2013   SLF: 1. Internal hemorrhoids 2. Ascending colon polyps 3.  Rectal varices.   . COLONOSCOPY N/A 08/10/2016   Dr. Oneida Alar: redundant left colon. external and internal hemorhroids. 5-10 year surveillance  . ESOPHAGOGASTRODUODENOSCOPY N/A 06/29/2014   SLF: 1. anemia due to polypoid lesions in teh antrum, and duodenal polyps 2. single duodenum AVM  . FLEXIBLE SIGMOIDOSCOPY N/A 01/21/2016   Procedure: FLEXIBLE SIGMOIDOSCOPY;  Surgeon: Danie Binder, MD;  [facility];  Service: Endoscopy;  Laterality: N/A;  245  . GIVENS CAPSULE STUDY N/A 06/29/2014   Procedure: GIVENS CAPSULE STUDY;  Surgeon: Danie Binder, MD;  [facility];  Service: Endoscopy;  Laterality: N/A;  . GIVENS CAPSULE STUDY N/A 07/13/2014   Procedure: GIVENS CAPSULE STUDY;  Surgeon: Danie Binder, MD;  [facility];  Service: Endoscopy;  Laterality: N/A;  700  . HEMORRHOID BANDING N/A 01/21/2016   Procedure: HEMORRHOID BANDING;  Surgeon: Danie Binder, MD;  [facility];  Service: Endoscopy;  Laterality: N/A;  . INGUINAL HERNIA REPAIR Right 08/28/2013   Procedure: RIGHT NGUINAL HERNIORRHAPHY WITH MESH;  Surgeon: Jamesetta So, MD;  Location: [facility];  Service: General;  Laterality: Right;  . INSERTION OF MESH N/A 06/09/2013   Procedure: INSERTION OF MESH;  Surgeon: Jamesetta So, MD;  Location: [facility];  Service: General;  Laterality: N/A;  . INSERTION OF MESH Right 08/28/2013   Procedure:  INSERTION OF MESH;  Surgeon: Jamesetta So, MD;  Location: [facility];  Service: General;  Laterality: Right;  . PARACENTESIS  Feb 2014   10.7 liters  . TOOTH EXTRACTION Left 12/12/14  . UMBILICAL HERNIA REPAIR N/A 06/09/2013   Procedure: UMBILICAL HERNIORRHAPHY WITH MESH;  Surgeon: Jamesetta So, MD;  Location: [facility];  Service: General;  Laterality: N/A;    Prior to Admission medications   Medication Sig Start Date End Date Taking? Authorizing Provider  aspirin 325 MG tablet Take 325 mg by mouth daily.   Yes [provider]  atorvastatin (LIPITOR) 40 MG tablet TAKE (1) TABLET BY MOUTH ONCE DAILY 10/17/18  Yes Deanna Kirschner, MD  bumetanide (BUMEX) 1 MG tablet TAKE (1) TABLET BY MOUTH (2) TIMES DAILY. 03/04/18  Yes Annitta Needs, NP  buPROPion (WELLBUTRIN SR) 100 MG 12 hr tablet TAKE (1) TABLET BY MOUTH TWICE DAILY. 10/17/18  Yes Deanna Kirschner, MD  docusate sodium (COLACE) 100 MG capsule Take 100 mg by mouth daily.    Yes [provider]  escitalopram (LEXAPRO) 20 MG tablet TAKE (1) TABLET BY MOUTH AT  BEDTIME. 10/17/18  Yes Deanna Kirschner, MD  famotidine (PEPCID) 20 MG tablet 1 PO BID MON-FRI 01/26/18  Yes Fields, Sandi L, MD  fluticasone (FLONASE) 50 MCG/ACT nasal spray Place 1 spray into both nostrils See admin instructions. Instill 1 spray into each nostril every morning, may also use later in the day as needed for sinus congestion   Yes [provider]  hydrOXYzine (ATARAX/VISTARIL) 25 MG tablet Take one tablet by mouth every 6 hrs prn itching 10/18/18  Yes Deanna Kirschner, MD  Lifitegrast Shirley Friar) 5 % SOLN Place 1 drop into both eyes 2 (two) times daily.   Yes [provider]  LINZESS 145 MCG CAPS capsule TAKE (1) CAPSULE BY MOUTH EVERY DAY BEFORE BREAKFAST. 03/28/18  Yes Annitta Needs, NP  loratadine (CLARITIN) 10 MG tablet Take 10 mg by mouth daily.   Yes [provider]  losartan (COZAAR) 25 MG tablet TAKE (1) TABLET BY MOUTH ONCE DAILY.  10/17/18  Yes Deanna Kirschner, MD  Multiple Vitamin (MULTIVITAMIN WITH MINERALS) TABS tablet Take 1 tablet by mouth daily.   Yes [provider]  ondansetron (ZOFRAN) 4 MG tablet Take 1 tablet (4 mg total) by mouth every 8 (eight) hours as needed for nausea or vomiting. 08/02/17  Yes Patrecia Pour, MD  Polyvinyl Alcohol-Povidone (REFRESH OP) Place 1 drop into both eyes 3 (three) times daily as needed (dry eyes).   Yes [provider]  Simethicone (GAS-X PO) Take 1 tablet by mouth 2 (two) times daily as needed (bloating/flatulence).   Yes [provider]  sodium chloride (OCEAN) 0.65 % nasal spray Place 1 spray into the nose 2 (two) times daily as needed for congestion.    Yes [provider]  spironolactone (ALDACTONE) 100 MG tablet TAKE (1) TABLET BY MOUTH TWICE DAILY. 10/18/18  Yes Mahala Menghini, PA-C  Tofacitinib Citrate ER (XELJANZ XR) 11 MG TB24 Take 1 tablet by mouth daily. 1/2 tab daily    Yes [provider]  meclizine (ANTIVERT) 25 MG tablet Take 1 tablet (25 mg total) by mouth 3 (three) times daily as needed for dizziness. Patient not taking: Reported on 05/23/2018 08/02/17   Patrecia Pour, MD    Current Facility-Administered Medications  Medication Dose Route Frequency Provider Last Rate Last Dose  . alum & mag hydroxide-simeth (MAALOX/MYLANTA) 200-200-20 MG/5ML suspension 30 mL  30 mL Oral Q4H PRN Emokpae, Ejiroghene E, MD   30 mL at 10/21/18 0648  . buPROPion (WELLBUTRIN SR) 12 hr tablet 100 mg  100 mg Oral BID Emokpae, Ejiroghene E, MD      . cefTRIAXone (ROCEPHIN) 2 g in sodium chloride 0.9 % 100 mL IVPB  2 g Intravenous Q24H Emokpae, Ejiroghene E, MD   Stopped at 10/20/18 2308  . dextrose 5 % and 0.9 % NaCl with KCl 40 mEq/L infusion   Intravenous Continuous Emokpae, Ejiroghene E, MD 75 mL/hr at 10/21/18 0419    . escitalopram (LEXAPRO) tablet 20 mg  20 mg Oral QHS Emokpae, Ejiroghene E, MD   20 mg at 10/20/18 2348  . famotidine (PEPCID)  IVPB 20 mg premix  20 mg Intravenous Q12H Emokpae, Ejiroghene E, MD 100 mL/hr at 10/20/18 2324 20 mg at 10/20/18 2324  . Lifitegrast 5 % SOLN 1 drop  1 drop Both Eyes BID Emokpae, Ejiroghene E, MD      . loratadine (CLARITIN) tablet 10 mg  10 mg Oral Daily Emokpae, Ejiroghene E, MD      .  losartan (COZAAR) tablet 25 mg  25 mg Oral Daily Emokpae, Ejiroghene E, MD      . metroNIDAZOLE (FLAGYL) IVPB 500 mg  500 mg Intravenous Q8H Emokpae, Ejiroghene E, MD 100 mL/hr at 10/21/18 0631 500 mg at 10/21/18 0631  . morphine 2 MG/ML injection 2 mg  2 mg Intravenous Q4H PRN Emokpae, Ejiroghene E, MD      . ondansetron (ZOFRAN) tablet 4 mg  4 mg Oral Q6H PRN Emokpae, Ejiroghene E, MD       Or  . ondansetron (ZOFRAN) injection 4 mg  4 mg Intravenous Q6H PRN Emokpae, Ejiroghene E, MD      . polyethylene glycol (MIRALAX / GLYCOLAX) packet 17 g  17 g Oral Daily PRN Emokpae, Ejiroghene E, MD      . polyvinyl alcohol (LIQUIFILM TEARS) 1.4 % ophthalmic solution 2 drop  2 drop Both Eyes PRN Bodenheimer, Charles A, NP      . potassium chloride 10 mEq in 100 mL IVPB  10 mEq Intravenous Once Emokpae, Ejiroghene E, MD      . Tofacitinib Citrate ER TB24 0.5 tablet  0.5 tablet Oral Daily Emokpae, Ejiroghene E, MD        Allergies as of 10/20/2018 - Review Complete 10/20/2018  Allergen Reaction Noted  . Omeprazole Shortness Of Breath 08/19/2011  . Orencia [abatacept] Anaphylaxis, Hives, and Rash 10/25/2017  . Other Shortness Of Breath and Rash 01/09/2013  . Phenytoin Rash   . Keflex [cephalexin] Other (See Comments)   . Phenergan [promethazine hcl] Other (See Comments) 04/08/2012    Family History  Problem Relation Age of Onset  . Cirrhosis Mother        nash  . COPD Father   . Heart disease Father   . Asthma Other   . Heart disease Other   . Colon cancer Neg Hx   . Inflammatory bowel disease Neg Hx     Social History   Socioeconomic History  . Marital status: Married    Spouse name: Not on file  .  Number of children: 0  . Years of education: Not on file  . Highest education level: Not on file  Occupational History    Employer: RETIRED  Social Needs  . Financial resource strain: Not on file  . Food insecurity    Worry: Not on file    Inability: Not on file  . Transportation needs    Medical: Not on file    Non-medical: Not on file  Tobacco Use  . Smoking status: Former Smoker    Packs/day: 2.00    Years: 12.00    Pack years: 24.00    Types: Cigarettes    Quit date: 03/31/1995    Years since quitting: 23.5  . Smokeless tobacco: Never Used  . Tobacco comment: quit in 1997  Substance and Sexual Activity  . Alcohol use: No    Comment: Bottle of wine a day for years, but quit in 07/2011  . Drug use: No  . Sexual activity: Not Currently    Birth control/protection: Post-menopausal  Lifestyle  . Physical activity    Days per week: Not on file    Minutes per session: Not on file  . Stress: Not on file  Relationships  . Social Herbalist on phone: Not on file    Gets together: Not on file    Attends religious service: Not on file    Active member of club or organization: Not on file  Attends meetings of clubs or organizations: Not on file    Relationship status: Not on file  . Intimate partner violence    Fear of current or ex partner: Not on file    Emotionally abused: Not on file    Physically abused: Not on file    Forced sexual activity: Not on file  Other Topics Concern  . Not on file  Social History Narrative   Lives   Caffeine use:    Quit drinking alcohol 3 YEARS ago. No drugs, no IVDU.     Review of Systems: General: Negative for anorexia, weight loss, fever, chills, fatigue, weakness. ENT: Negative for hoarseness, difficulty swallowing. CV: Negative for chest pain, angina, palpitations, peripheral edema.  Respiratory: Negative for dyspnea at rest, cough, sputum, wheezing.  GI: See history of present illness. MS: Negative for joint pain, low  back pain.  Derm: Negative for rash or itching.  Endo: Negative for unusual weight change.  Heme: Negative for bruising or bleeding. Allergy: Negative for rash or hives.  Physical Exam: Vital signs in last 24 hours: Temp:  [97.9 F (36.6 C)-99.2 F (37.3 C)] 99.2 F (37.3 C) (07/24 0540) Pulse Rate:  [63-79] 68 (07/24 0540) Resp:  [15-18] 18 (07/24 0540) BP: (79-130)/(55-81) 130/61 (07/24 0540) SpO2:  [97 %-100 %] 100 % (07/24 0540) Weight:  [111.1 kg-111.3 kg] 111.1 kg (07/23 2300) Last BM Date: 10/20/18(diarrhea) General:   Alert,  Well-developed, well-nourished, pleasant and cooperative in NAD Head:  Normocephalic and atraumatic. Eyes:  Sclera clear, no icterus. Conjunctiva pink. Ears:  Normal auditory acuity. Neck:  Supple; no masses or thyromegaly. Lungs:  Clear throughout to auscultation. No wheezes, crackles, or rhonchi. No acute distress. Heart:  Regular rate and rhythm; no murmurs, clicks, rubs,  or gallops. Abdomen:  Soft, and nondistended. Mild RUQ TTP. No masses, hepatosplenomegaly or hernias noted. Normal bowel sounds, without guarding, and without rebound.   Rectal:  Deferred until time of colonoscopy.   Msk:  Symmetrical without gross deformities. Extremities:  Without clubbing or edema. Neurologic:  Alert and  oriented x4;  grossly normal neurologically. Skin:  Intact without significant lesions or rashes. No obvious jaundice (though limited by the lighting in Ultrasound where the patient was seen). Cervical Nodes:  No significant cervical adenopathy. Psych:  Alert and cooperative. Normal mood and affect.  Intake/Output from previous day: 07/23 0701 - 07/24 0700 In: 1573.8 [I.V.:229.8; IV Piggyback:1344] Out: 400 [Urine:400] Intake/Output this shift: No intake/output data recorded.  Lab Results: Recent Labs    10/20/18 1814 10/21/18 0544  WBC 8.3 7.4  HGB 11.9* 11.0*  HCT 37.2 34.2*  PLT 261 186   BMET Recent Labs    10/20/18 1814 10/21/18 0544   NA 137 138  K 2.6* 3.6  CL 103 106  CO2 23 23  GLUCOSE 113* 136*  BUN 19 15  CREATININE 1.48* 1.01*  CALCIUM 9.3 8.7*   LFT Recent Labs    10/20/18 1814 10/21/18 0544  PROT 7.7 6.7  ALBUMIN 3.8 3.2*  AST 93* 77*  ALT 108* 91*  ALKPHOS 185* 166*  BILITOT 4.4* 3.8*   PT/INR No results for input(s): LABPROT, INR in the last 72 hours. Hepatitis Panel No results for input(s): HEPBSAG, HCVAB, HEPAIGM, HEPBIGM in the last 72 hours. C-Diff No results for input(s): CDIFFTOX in the last 72 hours.  Studies/Results: Ct Abdomen Pelvis W Contrast  Result Date: 10/20/2018 CLINICAL DATA:  63 year old female with right upper quadrant abdominal pain. EXAM: CT ABDOMEN AND  PELVIS WITH CONTRAST TECHNIQUE: Multidetector CT imaging of the abdomen and pelvis was performed using the standard protocol following bolus administration of intravenous contrast. CONTRAST:  53m OMNIPAQUE IOHEXOL 300 MG/ML  SOLN COMPARISON:  Abdominal ultrasound dated 05/26/2018 FINDINGS: Lower chest: The visualized lung bases are clear. No intra-abdominal free air or free fluid. Hepatobiliary: Cirrhosis. Large amount of stone noted within the gallbladder. There is an area of thickening of the gallbladder wall with probable small pericholecystic fluid. There is apparent focal area of discontinuity in the superior gallbladder wall (series 6 image 40 and sagittal series 7, image 51) is likely artifactual and related to linear intraluminal sludge/stones. A focally contained perforation is favored less likely. Further evaluation with ultrasound is recommended. Pancreas: Unremarkable. No pancreatic ductal dilatation or surrounding inflammatory changes. Spleen: Normal in size without focal abnormality. Adrenals/Urinary Tract: The adrenal glands are unremarkable. There is no hydronephrosis on either side. There is symmetric enhancement and excretion of contrast by both kidneys. The visualized ureters and urinary bladder appear  unremarkable. Stomach/Bowel: There is no bowel obstruction or active inflammation. The appendix is normal. Vascular/Lymphatic: Mild aortoiliac atherosclerotic disease. No portal venous gas. There is no adenopathy. Reproductive: Small appearance of the uterus may represent partial hysterectomy or atrophic. No pelvic mass. Other: None Musculoskeletal: No acute or significant osseous findings. IMPRESSION: 1. Cholelithiasis with findings concerning for acute cholecystitis. Further evaluation with ultrasound is recommended. 2. Cirrhosis. 3. No bowel obstruction or active inflammation. Normal appendix. 4. No ascites. 5. Aortic Atherosclerosis (ICD10-I70.0). Electronically Signed   By: AAnner CreteM.D.   On: 10/20/2018 20:33    Impression: 63year old female known to our practice with history of alcoholic cirrhosis with no alcohol intake since 2013.  Historically her LFTs have been normal and she has had well compensated cirrhosis.  She presents today with acute onset abdominal pain, nausea, vomiting with CT imaging concerning for acute cholecystitis.  Given her significant elevation of LFTs and her elevated lipase there is understandable concern for choledocholithiasis and possible gallstone pancreatitis.  She has been admitted for further treatment.  She has been started on IV antibiotics appropriately.  There is an ultrasound ordered for today.  Surgery has been consulted presumably for consideration of cholecystectomy.  Today her LFTs are somewhat improved, CBC stable. U/S pending. Overall improved today, pending lap cholecystectomy on Monday.  Plan: 1. Agree with U/S 2. No anticipated need for ERCP/MRCP at this time given improved labs 3. Appreciate pending surgical input 4. Keep H2RB given PPI allergy 5. Supportive measures 6. Start clear liquids today given significant symptomatic improvement.   Thank you for allowing [facility] participate in the care of V[place] DNP,  AGNP-C Adult & Gerontological Nurse Practitioner [facility]    LOS: 0 days     10/21/2018, 8:36 AM

## 2018-10-21 NOTE — Progress Notes (Signed)
Patient Demographics:    Deanna Thompson, is a 63 y.o. female, DOB - 03-Nov-1955, HHI:[id number]  Admit date - 10/20/2018   Admitting Physician Ejiroghene Arlyce Dice, MD  Outpatient Primary MD for the patient is Luking, Grace Bushy, MD  LOS - 0   Chief Complaint  Patient presents with  . Abdominal Pain        Subjective:    [facility] today has no fevers, no further emesis,  No chest pain,  Tolerating liquid diet.....  Assessment  & Plan :    Principal Problem:   Acute cholecystitis Active Problems:   Gall stone pancreatitis   Elevated liver enzymes   Non-intractable vomiting   Abstinence from alcohol   Brief Summary:  63 y.o. female with medical history significant for liver cirrhosis, hypertension, CKD 3, CVA, psoriatic arthritis, alcohol abuse, brain aneurysm s/p clipping and coiling admitted on 10/20/18 with Gallstone Pancreatitis   A/p 1)Acute Calculous pancreatitis --- GI and general surgery consult appreciated, for possible lap chole once pancreatitis improves,  -Try liquid diet for now, -PRN antiemetics and pain medications --Not a candidate for MRCP due to aneurysm clippings --If IOC during lap chole suggest obstruction patient may need ERCP post lap chole -Trend LFTs (currently AST is down to 77 from 93, ALT is down to 91 from 108, alk phos is down to 166 from 185, T bili is down to 3.8 from 4.4)  2)H/o alcoholic liver disease--- patient has been abstinent from alcohol for over 8 years --GI input appreciated -Avoid hepatotoxic agents  3)AKI----acute kidney injury on CKD stage - III   --- patient presented with AKI on CKD due to dehydration, improved with hydration, creatinine appears to have normalized at this time creatinine on admission=1.48  ,   baseline creatinine = 1.2  , creatinine is now=1.0      , renally adjust medications, avoid nephrotoxic  agents/dehydration/hypotension -Hold Losartan  4)HTN--hold losartan until patient is euvolemic due to AKI in the setting of dehydration concerns, - may use IV Hydralazine 10 mg  Every 4 hours Prn for systolic blood pressure over 160 mmhg   Disposition/Need for in-Hospital Stay- patient unable to be discharged at this time due to pancreatitis and gallstones patient most likely require lap chole, patient needs IV fluids for AKI and poor oral intake  Code Status : Full  Family Communication:   NA (patient is alert, awake and coherent)   Disposition Plan  : TBD   Consults  :  Gi/Gen surgery  DVT Prophylaxis  :   - Heparin - SCDs *   Lab Results  Component Value Date   PLT 186 10/21/2018    Inpatient Medications  Scheduled Meds: . buPROPion  100 mg Oral BID  . escitalopram  20 mg Oral QHS  . famotidine  20 mg Oral BID WC  . loratadine  10 mg Oral Daily  . losartan  25 mg Oral Daily   Continuous Infusions: . cefTRIAXone (ROCEPHIN)  IV Stopped (10/20/18 2308)  . metronidazole 500 mg (10/21/18 1512)  . potassium chloride     PRN Meds:.alum & mag hydroxide-simeth, morphine injection, ondansetron **OR** ondansetron (ZOFRAN) IV, polyethylene glycol, polyvinyl alcohol    Anti-infectives (From admission, onward)   Start  Dose/Rate Route Frequency Ordered Stop   10/20/18 2300  metroNIDAZOLE (FLAGYL) IVPB 500 mg     500 mg 100 mL/hr over 60 Minutes Intravenous Every 8 hours 10/20/18 2217     10/20/18 2230  cefTRIAXone (ROCEPHIN) 2 g in sodium chloride 0.9 % 100 mL IVPB     2 g 200 mL/hr over 30 Minutes Intravenous Every 24 hours 10/20/18 2217          Objective:   Vitals:   10/20/18 2342 10/20/18 2344 10/21/18 0540 10/21/18 1319  BP: (!) 79/55 124/65 130/61 (!) 111/57  Pulse: 63  68 66  Resp: '18  18 18  '$ Temp: 98.4 F (36.9 C)  99.2 F (37.3 C) 98.6 F (37 C)  TempSrc: Oral  Oral   SpO2: 100%  100% 99%  Weight:      Height:        Wt Readings from Last 3  Encounters:  10/20/18 111.1 kg  08/16/18 111.3 kg  04/25/18 112.3 kg     Intake/Output Summary (Last 24 hours) at 10/21/2018 1606 Last data filed at 10/21/2018 1500 Gross per 24 hour  Intake 2323.77 ml  Output 1400 ml  Net 923.77 ml     Physical Exam  Gen:- Awake Alert,  In no apparent distress  HEENT:- [place].AT, +ve sclera icterus Neck-Supple Neck,No JVD,.  Lungs-  CTAB , fair symmetrical air movement CV- S1, S2 normal, regular  Abd-  +ve B.Sounds, Abd Soft, epigastric and left upper quadrant tenderness,   without rebound or guarding, no CVA tenderness Extremity/Skin:- No  edema, pedal pulses present  Psych-affect is appropriate, oriented x3 Neuro-no new focal deficits, no tremors   Data Review:   Micro Results Recent Results (from the past 240 hour(s))  Novel Coronavirus, NAA ([facility])     Status: None   Collection Time: 10/17/18  3:06 PM  Result Value Ref Range Status   SARS-CoV-2, NAA Not Detected Not Detected Final    Comment: This test was developed and its performance characteristics determined by [employer]. This test has not been FDA cleared or approved. This test has been authorized by FDA under an Emergency Use Authorization (EUA). This test is only authorized for the duration of time the declaration that circumstances exist justifying the authorization of the emergency use of in vitro diagnostic tests for detection of SARS-CoV-2 virus and/or diagnosis of COVID-19 infection under section 564(b)(1) of the Act, 21 U.S.C. 761YWV-3(X)(1), unless the authorization is terminated or revoked sooner. When diagnostic testing is negative, the possibility of a false negative result should be considered in the context of a patient's recent exposures and the presence of clinical signs and symptoms consistent with COVID-19. An individual without symptoms of COVID-19 and who is not shedding SARS-CoV-2 virus would expect to have a negative (not detected) result in  this assay.   SARS Coronavirus 2 (CEPHEID - Performed in [facility] hospital lab), Hosp Order     Status: None   Collection Time: 10/20/18  9:10 PM   Specimen: Nasopharyngeal Swab  Result Value Ref Range Status   SARS Coronavirus 2 NEGATIVE NEGATIVE Final    Comment: (NOTE) If result is NEGATIVE SARS-CoV-2 target nucleic acids are NOT DETECTED. The SARS-CoV-2 RNA is generally detectable in upper and lower  respiratory specimens during the acute phase of infection. The lowest  concentration of SARS-CoV-2 viral copies this assay can detect is 250  copies / mL. A negative result does not preclude SARS-CoV-2 infection  and should not be  used as the sole basis for treatment or other  patient management decisions.  A negative result may occur with  improper specimen collection / handling, submission of specimen other  than nasopharyngeal swab, presence of viral mutation(s) within the  areas targeted by this assay, and inadequate number of viral copies  (<250 copies / mL). A negative result must be combined with clinical  observations, patient history, and epidemiological information. If result is POSITIVE SARS-CoV-2 target nucleic acids are DETECTED. The SARS-CoV-2 RNA is generally detectable in upper and lower  respiratory specimens dur ing the acute phase of infection.  Positive  results are indicative of active infection with SARS-CoV-2.  Clinical  correlation with patient history and other diagnostic information is  necessary to determine patient infection status.  Positive results do  not rule out bacterial infection or co-infection with other viruses. If result is PRESUMPTIVE POSTIVE SARS-CoV-2 nucleic acids MAY BE PRESENT.   A presumptive positive result was obtained on the submitted specimen  and confirmed on repeat testing.  While 2019 novel coronavirus  (SARS-CoV-2) nucleic acids may be present in the submitted sample  additional confirmatory testing may be necessary for  epidemiological  and / or clinical management purposes  to differentiate between  SARS-CoV-2 and other Sarbecovirus currently known to infect humans.  If clinically indicated additional testing with an alternate test  methodology [phone]) is advised. The SARS-CoV-2 RNA is generally  detectable in upper and lower respiratory sp ecimens during the acute  phase of infection. The expected result is Negative. Fact Sheet for Patients:  StrictlyIdeas.no Fact Sheet for Healthcare Providers: BankingDealers.co.za This test is not yet approved or cleared by the [place] FDA and has been authorized for detection and/or diagnosis of SARS-CoV-2 by FDA under an Emergency Use Authorization (EUA).  This EUA will remain in effect (meaning this test can be used) for the duration of the COVID-19 declaration under Section 564(b)(1) of the Act, 21 U.S.C. section 360bbb-3(b)(1), unless the authorization is terminated or revoked sooner. Performed at [facility], [address], [place], [place] [address]     Radiology Reports Ct Abdomen Pelvis W Contrast  Result Date: 10/20/2018 CLINICAL DATA:  63 year old female with right upper quadrant abdominal pain. EXAM: CT ABDOMEN AND PELVIS WITH CONTRAST TECHNIQUE: Multidetector CT imaging of the abdomen and pelvis was performed using the standard protocol following bolus administration of intravenous contrast. CONTRAST:  59m OMNIPAQUE IOHEXOL 300 MG/ML  SOLN COMPARISON:  Abdominal ultrasound dated 05/26/2018 FINDINGS: Lower chest: The visualized lung bases are clear. No intra-abdominal free air or free fluid. Hepatobiliary: Cirrhosis. Large amount of stone noted within the gallbladder. There is an area of thickening of the gallbladder wall with probable small pericholecystic fluid. There is apparent focal area of discontinuity in the superior gallbladder wall (series 6 image 40 and sagittal series 7, image 51) is likely  artifactual and related to linear intraluminal sludge/stones. A focally contained perforation is favored less likely. Further evaluation with ultrasound is recommended. Pancreas: Unremarkable. No pancreatic ductal dilatation or surrounding inflammatory changes. Spleen: Normal in size without focal abnormality. Adrenals/Urinary Tract: The adrenal glands are unremarkable. There is no hydronephrosis on either side. There is symmetric enhancement and excretion of contrast by both kidneys. The visualized ureters and urinary bladder appear unremarkable. Stomach/Bowel: There is no bowel obstruction or active inflammation. The appendix is normal. Vascular/Lymphatic: Mild aortoiliac atherosclerotic disease. No portal venous gas. There is no adenopathy. Reproductive: Small appearance of the uterus may represent partial hysterectomy or atrophic.  No pelvic mass. Other: None Musculoskeletal: No acute or significant osseous findings. IMPRESSION: 1. Cholelithiasis with findings concerning for acute cholecystitis. Further evaluation with ultrasound is recommended. 2. Cirrhosis. 3. No bowel obstruction or active inflammation. Normal appendix. 4. No ascites. 5. Aortic Atherosclerosis (ICD10-I70.0). Electronically Signed   By: Anner Crete M.D.   On: 10/20/2018 20:33   US Abdomen Limited Ruq  Result Date: 10/21/2018 CLINICAL DATA:  Elevated LFTs EXAM: ULTRASOUND ABDOMEN LIMITED RIGHT UPPER QUADRANT COMPARISON:  CT 10/20/2018.  Ultrasound 05/26/2018 FINDINGS: Gallbladder: Multiple layering stones. Gallbladder wall is upper limits normal at 2.7 mm. Negative sonographic Murphy's. Some sludge also within the gallbladder. Common bile duct: Diameter: Normal caliber, 5 mm Liver: Heterogeneous echotexture with nodular contours compatible with cirrhosis. No focal hepatic abnormality. Portal vein is patent on color Doppler imaging with normal direction of blood flow towards the liver. IMPRESSION: Layering stones and sludge within wall  the gallbladder is upper limits. Gallbladder normal in thickness and there is no sonographic Murphy sign. No sonographic convincing evidence for acute cholecystitis. Changes of cirrhosis. Electronically Signed   By: Rolm Baptise M.D.   On: 10/21/2018 10:12     CBC Recent Labs  Lab 10/20/18 1814 10/21/18 0544  WBC 8.3 7.4  HGB 11.9* 11.0*  HCT 37.2 34.2*  PLT 261 186  MCV 91.4 91.7  MCH 29.2 29.5  MCHC 32.0 32.2  RDW 19.4* 19.7*    Chemistries  Recent Labs  Lab 10/20/18 1814 10/21/18 0544  NA 137 138  K 2.6* 3.6  CL 103 106  CO2 23 23  GLUCOSE 113* 136*  BUN 19 15  CREATININE 1.48* 1.01*  CALCIUM 9.3 8.7*  MG 2.0  --   AST 93* 77*  ALT 108* 91*  ALKPHOS 185* 166*  BILITOT 4.4* 3.8*   ------------------------------------------------------------------------------------------------------------------ No results for input(s): CHOL, HDL, LDLCALC, TRIG, CHOLHDL, LDLDIRECT in the last 72 hours.  Lab Results  Component Value Date   HGBA1C 5.8 (H) 08/01/2017   ------------------------------------------------------------------------------------------------------------------ No results for input(s): TSH, T4TOTAL, T3FREE, THYROIDAB in the last 72 hours.  Invalid input(s): FREET3 ------------------------------------------------------------------------------------------------------------------ No results for input(s): VITAMINB12, FOLATE, FERRITIN, TIBC, IRON, RETICCTPCT in the last 72 hours.  Coagulation profile Recent Labs  Lab 10/21/18 0904  INR 1.1    No results for input(s): DDIMER in the last 72 hours.  Cardiac Enzymes No results for input(s): CKMB, TROPONINI, MYOGLOBIN in the last 168 hours.  Invalid input(s): CK ------------------------------------------------------------------------------------------------------------------ No results found for: BNP   Roxan Hockey M.D on 10/21/2018 at 4:06 PM  Go to [email or web address] - for contact info  [facility]  - Office  [phone]

## 2018-10-21 NOTE — Progress Notes (Signed)
Nutrition Brief Note  Patient identified on the Malnutrition Screening Tool (MST) Report  Patient presents from home with abdominal pain and complaint of loose stools. PMH: Cirrhosis of liver, CVA, CKD and HTN.    Abdominal US; gallstones and patient needs a cholecystectomy per surgeon.  Her weight history shows gradual gain the past year. No significant loss/gain noted.   Wt Readings from Last 15 Encounters:  10/20/18 111.1 kg  08/16/18 111.3 kg  04/25/18 112.3 kg  01/26/18 110.3 kg  01/03/18 107 kg  12/22/17 107 kg  12/06/17 107 kg  11/19/17 106.6 kg  11/02/17 104.6 kg  10/25/17 103.4 kg  10/12/17 102.5 kg  10/07/17 104.6 kg  09/02/17 103.9 kg  08/01/17 103.4 kg  07/14/17 103.2 kg    Body mass index is 37.24 kg/m. Patient meets criteria for obese  based on current BMI.   Current diet order is full liquids, patient is consuming approximately 100% of meals at this time per NT and was asking for more. She has been up to the bathroom to freshen up and says she is feeling much better today.  Labs and medications reviewed.  BMP Latest Ref Rng & Units 10/21/2018 10/20/2018 08/09/2018  Glucose 70 - 99 mg/dL 136(H) 113(H) 102(H)  BUN 8 - 23 mg/dL 15 19 21   Creatinine 0.44 - 1.00 mg/dL 1.01(H) 1.48(H) 1.22(H)  BUN/Creat Ratio 6 - 22 (calc) - - -  Sodium 135 - 145 mmol/L 138 137 136  Potassium 3.5 - 5.1 mmol/L 3.6 2.6(LL) 4.2  Chloride 98 - 111 mmol/L 106 103 100  CO2 22 - 32 mmol/L 23 23 24   Calcium 8.9 - 10.3 mg/dL 8.7(L) 9.3 9.2    No nutrition interventions warranted at this time. If nutrition issues arise, please consult RD.   Colman Cater MS,RD,CSG,LDN Office: [phone] Pager: [phone]

## 2018-10-22 DIAGNOSIS — K851 Biliary acute pancreatitis without necrosis or infection: Secondary | ICD-10-CM | POA: Diagnosis not present

## 2018-10-22 LAB — CBC
HCT: 33.3 % — ABNORMAL LOW (ref 36.0–46.0)
Hemoglobin: 10.5 g/dL — ABNORMAL LOW (ref 12.0–15.0)
MCH: 29.2 pg (ref 26.0–34.0)
MCHC: 31.5 g/dL (ref 30.0–36.0)
MCV: 92.8 fL (ref 80.0–100.0)
Platelets: 162 10*3/uL (ref 150–400)
RBC: 3.59 MIL/uL — ABNORMAL LOW (ref 3.87–5.11)
RDW: 20.2 % — ABNORMAL HIGH (ref 11.5–15.5)
WBC: 5.3 10*3/uL (ref 4.0–10.5)
nRBC: 0 % (ref 0.0–0.2)

## 2018-10-22 LAB — COMPREHENSIVE METABOLIC PANEL
ALT: 67 U/L — ABNORMAL HIGH (ref 0–44)
AST: 49 U/L — ABNORMAL HIGH (ref 15–41)
Albumin: 3 g/dL — ABNORMAL LOW (ref 3.5–5.0)
Alkaline Phosphatase: 146 U/L — ABNORMAL HIGH (ref 38–126)
Anion gap: 7 (ref 5–15)
BUN: 10 mg/dL (ref 8–23)
CO2: 25 mmol/L (ref 22–32)
Calcium: 8.7 mg/dL — ABNORMAL LOW (ref 8.9–10.3)
Chloride: 107 mmol/L (ref 98–111)
Creatinine, Ser: 0.88 mg/dL (ref 0.44–1.00)
GFR calc Af Amer: >60 mL/min (ref >60)
GFR calc non Af Amer: >60 mL/min (ref >60)
Glucose, Bld: 121 mg/dL — ABNORMAL HIGH (ref 70–99)
Potassium: 3.8 mmol/L (ref 3.5–5.1)
Sodium: 139 mmol/L (ref 135–145)
Total Bilirubin: 2.6 mg/dL — ABNORMAL HIGH (ref 0.3–1.2)
Total Protein: 6.4 g/dL — ABNORMAL LOW (ref 6.5–8.1)

## 2018-10-22 LAB — HIV ANTIBODY (ROUTINE TESTING W REFLEX): HIV Screen 4th Generation wRfx: NONREACTIVE

## 2018-10-22 NOTE — Discharge Instructions (Signed)
Come to [facility] around 10am on Monday, 10/24/18    Laparoscopic Cholecystectomy Laparoscopic cholecystectomy is surgery to remove the gallbladder. The gallbladder is a pear-shaped organ that lies beneath the liver on the right side of the body. The gallbladder stores bile, which is a fluid that helps the body to digest fats. Cholecystectomy is often done for inflammation of the gallbladder (cholecystitis). This condition is usually caused by a buildup of gallstones (cholelithiasis) in the gallbladder. Gallstones can block the flow of bile, which can result in inflammation and pain. In severe cases, emergency surgery may be required. This procedure is done though small incisions in your abdomen (laparoscopic surgery). A thin scope with a camera (laparoscope) is inserted through one incision. Thin surgical instruments are inserted through the other incisions. In some cases, a laparoscopic procedure may be turned into a type of surgery that is done through a larger incision (open surgery). Tell a health care provider about:  Any allergies you have.  All medicines you are taking, including vitamins, herbs, eye drops, creams, and over-the-counter medicines.  Any problems you or family members have had with anesthetic medicines.  Any blood disorders you have.  Any surgeries you have had.  Any medical conditions you have.  Whether you are pregnant or may be pregnant. What are the risks? Generally, this is a safe procedure. However, problems may occur, including:  Infection.  Bleeding.  Allergic reactions to medicines.  Damage to other structures or organs.  A stone remaining in the common bile duct. The common bile duct carries bile from the gallbladder into the small intestine.  A bile leak from the cyst duct that is clipped when your gallbladder is removed. What happens before the procedure? Medicines  Ask your health care provider about: ? Changing or stopping your  regular medicines. This is especially important if you are taking diabetes medicines or blood thinners. ? Taking medicines such as aspirin and ibuprofen. These medicines can thin your blood. Do not take these medicines before your procedure if your health care provider instructs you not to.  You may be given antibiotic medicine to help prevent infection. General instructions  Let your health care provider know if you develop a cold or an infection before surgery.  Plan to have someone take you home from the hospital or clinic.  Ask your health care provider how your surgical site will be marked or identified. What happens during the procedure?   To reduce your risk of infection: ? Your health care team will wash or sanitize their hands. ? Your skin will be washed with soap. ? Hair may be removed from the surgical area.  An IV tube may be inserted into one of your veins.  You will be given one or more of the following: ? A medicine to help you relax (sedative). ? A medicine to make you fall asleep (general anesthetic).  A breathing tube will be placed in your mouth.  Your surgeon will make several small cuts (incisions) in your abdomen.  The laparoscope will be inserted through one of the small incisions. The camera on the laparoscope will send images to a TV screen (monitor) in the operating room. This lets your surgeon see inside your abdomen.  Air-like gas will be pumped into your abdomen. This will expand your abdomen to give the surgeon more room to perform the surgery.  Other tools that are needed for the procedure will be inserted through the other incisions. The gallbladder will  be removed through one of the incisions.  Your common bile duct may be examined. If stones are found in the common bile duct, they may be removed.  After your gallbladder has been removed, the incisions will be closed with stitches (sutures), staples, or skin glue.  Your incisions may be covered  with a bandage (dressing). The procedure may vary among health care providers and hospitals. What happens after the procedure?  Your blood pressure, heart rate, breathing rate, and blood oxygen level will be monitored until the medicines you were given have worn off.  You will be given medicines as needed to control your pain.  Do not drive for 24 hours if you were given a sedative. This information is not intended to replace advice given to you by your health care provider. Make sure you discuss any questions you have with your health care provider. Document Released: 03/16/2005 Document Revised: 02/26/2017 Document Reviewed: 09/02/2015 Elsevier Patient Education  2020 [employer].

## 2018-10-22 NOTE — Progress Notes (Signed)
Subjective: Patient tolerating diet well.  No abdominal pain noted.  No nausea or vomiting noted.  Objective: Vital signs in last 24 hours: Temp:  [98.4 F (36.9 C)-98.6 F (37 C)] 98.6 F (37 C) (07/25 0513) Pulse Rate:  [63-66] 64 (07/25 0513) Resp:  [18] 18 (07/25 0513) BP: (111-118)/(55-68) 111/68 (07/25 0513) SpO2:  [98 %-100 %] 98 % (07/25 0513) Last BM Date: 10/20/18  Intake/Output from previous day: 07/24 0701 - 07/25 0700 In: 1400 [P.O.:1400] Out: 2250 [Urine:2250] Intake/Output this shift: No intake/output data recorded.  General appearance: alert, cooperative and no distress GI: soft, non-tender; bowel sounds normal; no masses,  no organomegaly  Lab Results:  Recent Labs    10/21/18 0544 10/22/18 0554  WBC 7.4 5.3  HGB 11.0* 10.5*  HCT 34.2* 33.3*  PLT 186 162   BMET Recent Labs    10/21/18 0544 10/22/18 0554  NA 138 139  K 3.6 3.8  CL 106 107  CO2 23 25  GLUCOSE 136* 121*  BUN 15 10  CREATININE 1.01* 0.88  CALCIUM 8.7* 8.7*   PT/INR Recent Labs    10/21/18 0904  LABPROT 14.1  INR 1.1    Studies/Results: Ct Abdomen Pelvis W Contrast  Result Date: 10/20/2018 CLINICAL DATA:  63 year old female with right upper quadrant abdominal pain. EXAM: CT ABDOMEN AND PELVIS WITH CONTRAST TECHNIQUE: Multidetector CT imaging of the abdomen and pelvis was performed using the standard protocol following bolus administration of intravenous contrast. CONTRAST:  73mL OMNIPAQUE IOHEXOL 300 MG/ML  SOLN COMPARISON:  Abdominal ultrasound dated 05/26/2018 FINDINGS: Lower chest: The visualized lung bases are clear. No intra-abdominal free air or free fluid. Hepatobiliary: Cirrhosis. Large amount of stone noted within the gallbladder. There is an area of thickening of the gallbladder wall with probable small pericholecystic fluid. There is apparent focal area of discontinuity in the superior gallbladder wall (series 6 image 40 and sagittal series 7, image 51) is likely  artifactual and related to linear intraluminal sludge/stones. A focally contained perforation is favored less likely. Further evaluation with ultrasound is recommended. Pancreas: Unremarkable. No pancreatic ductal dilatation or surrounding inflammatory changes. Spleen: Normal in size without focal abnormality. Adrenals/Urinary Tract: The adrenal glands are unremarkable. There is no hydronephrosis on either side. There is symmetric enhancement and excretion of contrast by both kidneys. The visualized ureters and urinary bladder appear unremarkable. Stomach/Bowel: There is no bowel obstruction or active inflammation. The appendix is normal. Vascular/Lymphatic: Mild aortoiliac atherosclerotic disease. No portal venous gas. There is no adenopathy. Reproductive: Small appearance of the uterus may represent partial hysterectomy or atrophic. No pelvic mass. Other: None Musculoskeletal: No acute or significant osseous findings. IMPRESSION: 1. Cholelithiasis with findings concerning for acute cholecystitis. Further evaluation with ultrasound is recommended. 2. Cirrhosis. 3. No bowel obstruction or active inflammation. Normal appendix. 4. No ascites. 5. Aortic Atherosclerosis (ICD10-I70.0). Electronically Signed   By: Anner Crete M.D.   On: 10/20/2018 20:33   US Abdomen Limited Ruq  Result Date: 10/21/2018 CLINICAL DATA:  Elevated LFTs EXAM: ULTRASOUND ABDOMEN LIMITED RIGHT UPPER QUADRANT COMPARISON:  CT 10/20/2018.  Ultrasound 05/26/2018 FINDINGS: Gallbladder: Multiple layering stones. Gallbladder wall is upper limits normal at 2.7 mm. Negative sonographic Murphy's. Some sludge also within the gallbladder. Common bile duct: Diameter: Normal caliber, 5 mm Liver: Heterogeneous echotexture with nodular contours compatible with cirrhosis. No focal hepatic abnormality. Portal vein is patent on color Doppler imaging with normal direction of blood flow towards the liver. IMPRESSION: Layering stones and sludge within wall  the gallbladder is upper limits. Gallbladder normal in thickness and there is no sonographic Murphy sign. No sonographic convincing evidence for acute cholecystitis. Changes of cirrhosis. Electronically Signed   By: Rolm Baptise M.D.   On: 10/21/2018 10:12    Anti-infectives: Anti-infectives (From admission, onward)   Start     Dose/Rate Route Frequency Ordered Stop   10/20/18 2300  metroNIDAZOLE (FLAGYL) IVPB 500 mg     500 mg 100 mL/hr over 60 Minutes Intravenous Every 8 hours 10/20/18 2217     10/20/18 2230  cefTRIAXone (ROCEPHIN) 2 g in sodium chloride 0.9 % 100 mL IVPB     2 g 200 mL/hr over 30 Minutes Intravenous Every 24 hours 10/20/18 2217        Assessment/Plan: Impression: Gallstone pancreatitis, resolving Plan: Okay for discharge from surgery standpoint.  Patient is scheduled for laparoscopic cholecystectomy with intraoperative cholangiograms on 10/24/2018.  This will be performed on an outpatient basis.  LOS: 0 days    Aviva Signs 10/22/2018

## 2018-10-22 NOTE — Progress Notes (Signed)
   62 y.o.femalewith medical history significant forliver cirrhosis, hypertension, CKD 3, CVA, psoriatic arthritis, h/o alcohol abuse, h/o brain aneurysm s/pclipping and coiling admitted on 10/20/18 with Gallstone Pancreatitis--- \  Pt was neither seen, nor examined by hospitalist Team on 10/22/18 As pt was already dced home by Gen Surgeon prior to being rounded  On by [facility] Team Gen surgeon Dr Arnoldo Morale notify me of his plan for outpt lap chole on 10/24/18  Please see dc orders and summary from Dr. Fredderick Severance, MD

## 2018-10-24 ENCOUNTER — Ambulatory Visit: Payer: Commercial Managed Care - PPO | Admitting: Family Medicine

## 2018-10-24 NOTE — Discharge Summary (Signed)
Physician Discharge Summary  Patient ID: Deanna Thompson MRN: [id number] DOB/AGE: 05-04-1955 63 y.o.  Admit date: 10/20/2018 Discharge date: 10/22/2018  Admission Diagnoses: Gallstone pancreatitis  Discharge Diagnoses: Same Principal Problem:   Acute cholecystitis Active Problems:   Gall stone pancreatitis   Elevated liver enzymes   Non-intractable vomiting   Abstinence from alcohol   Discharged Condition: good  Hospital Course: Patient is a 63 year old white female with multiple medical problems including history of cirrhosis secondary to alcohol abuse who presented to the emergency room with worsening right upper quadrant abdominal pain, nausea, vomiting, and jaundice.  She was noted to have cholelithiasis with elevated liver enzyme tests as well as an elevated lipase and total bilirubin.  She was admitted to the hospital for further evaluation and treatment.  Due to history of a brain aneurysm clipping, an MRI could not be performed.  Her liver enzyme tests were starting to normalize.  It was elected to proceed with a laparoscopic cholecystectomy with intraoperative cholangiograms as an outpatient.  The patient is being discharged home on 10/22/2018 in stable and improving condition.  She will be returning to the hospital for surgery on 10/24/2018.  Consults: GI and general surgery   Discharge Exam: Blood pressure 111/68, pulse 64, temperature 98.6 F (37 C), temperature source Oral, resp. rate 18, height 5\' 8"  (1.727 m), weight 111.1 kg, SpO2 98 %. General appearance: alert, cooperative and no distress Resp: clear to auscultation bilaterally Cardio: regular rate and rhythm, S1, S2 normal, no murmur, click, rub or gallop GI: soft, non-tender; bowel sounds normal; no masses,  no organomegaly Eyes without scleral icterus. Disposition:   Discharge Instructions    Diet - low sodium heart healthy   Complete by: As directed    Increase activity slowly   Complete by: As directed       Allergies as of 10/22/2018      Reactions   Omeprazole Shortness Of Breath   Orencia [abatacept] Anaphylaxis, Hives, Rash   Other Shortness Of Breath, Rash   Red Meat Causes shortness of breath, rash and flu like symptoms.    Phenytoin Rash   Keflex [cephalexin] Other (See Comments)   Severe yeast infection   Phenergan [promethazine Hcl] Other (See Comments)   Not in right state of mind, HALLUCINATION      Medication List    TAKE these medications   aspirin 325 MG tablet Take 325 mg by mouth daily.   atorvastatin 40 MG tablet Commonly known as: LIPITOR TAKE (1) TABLET BY MOUTH ONCE DAILY   bumetanide 1 MG tablet Commonly known as: BUMEX TAKE (1) TABLET BY MOUTH (2) TIMES DAILY.   buPROPion 100 MG 12 hr tablet Commonly known as: WELLBUTRIN SR TAKE (1) TABLET BY MOUTH TWICE DAILY.   docusate sodium 100 MG capsule Commonly known as: COLACE Take 100 mg by mouth daily.   escitalopram 20 MG tablet Commonly known as: LEXAPRO TAKE (1) TABLET BY MOUTH AT BEDTIME.   famotidine 20 MG tablet Commonly known as: Pepcid 1 PO BID MON-FRI   fluticasone 50 MCG/ACT nasal spray Commonly known as: FLONASE Place 1 spray into both nostrils See admin instructions. Instill 1 spray into each nostril every morning, may also use later in the day as needed for sinus congestion   GAS-X PO Take 1 tablet by mouth 2 (two) times daily as needed (bloating/flatulence).   hydrOXYzine 25 MG tablet Commonly known as: ATARAX/VISTARIL Take one tablet by mouth every 6 hrs prn itching   Linzess 145  MCG Caps capsule Generic drug: linaclotide TAKE (1) CAPSULE BY MOUTH EVERY DAY BEFORE BREAKFAST.   loratadine 10 MG tablet Commonly known as: CLARITIN Take 10 mg by mouth daily.   losartan 25 MG tablet Commonly known as: COZAAR TAKE (1) TABLET BY MOUTH ONCE DAILY.   meclizine 25 MG tablet Commonly known as: ANTIVERT Take 1 tablet (25 mg total) by mouth 3 (three) times daily as needed for  dizziness.   multivitamin with minerals Tabs tablet Take 1 tablet by mouth daily.   ondansetron 4 MG tablet Commonly known as: ZOFRAN Take 1 tablet (4 mg total) by mouth every 8 (eight) hours as needed for nausea or vomiting.   REFRESH OP Place 1 drop into both eyes 3 (three) times daily as needed (dry eyes).   sodium chloride 0.65 % nasal spray Commonly known as: OCEAN Place 1 spray into the nose 2 (two) times daily as needed for congestion.   spironolactone 100 MG tablet Commonly known as: ALDACTONE TAKE (1) TABLET BY MOUTH TWICE DAILY.   Xeljanz XR 11 MG Tb24 Generic drug: Tofacitinib Citrate ER Take 1 tablet by mouth daily. 1/2 tab daily   Xiidra 5 % Soln Generic drug: Lifitegrast Place 1 drop into both eyes 2 (two) times daily.        Signed: Aviva Signs 10/24/2018, 9:12 AM

## 2018-10-26 ENCOUNTER — Encounter (HOSPITAL_COMMUNITY): Payer: Self-pay

## 2018-10-26 ENCOUNTER — Other Ambulatory Visit: Payer: Self-pay | Admitting: Gastroenterology

## 2018-10-26 ENCOUNTER — Other Ambulatory Visit: Payer: Self-pay

## 2018-10-26 ENCOUNTER — Other Ambulatory Visit (HOSPITAL_COMMUNITY)
Admission: RE | Admit: 2018-10-26 | Discharge: 2018-10-26 | Disposition: A | Discharge status: Home / Self Care | Payer: Commercial Managed Care - PPO | Source: Ambulatory Visit | Attending: General Surgery | Admitting: General Surgery

## 2018-10-26 ENCOUNTER — Encounter (HOSPITAL_COMMUNITY)
Admission: RE | Admit: 2018-10-26 | Discharge: 2018-10-26 | Disposition: A | Discharge status: Home / Self Care | Payer: Commercial Managed Care - PPO | Source: Ambulatory Visit | Attending: General Surgery | Admitting: General Surgery

## 2018-10-26 DIAGNOSIS — Z20828 Contact with and (suspected) exposure to other viral communicable diseases: Secondary | ICD-10-CM | POA: Diagnosis present

## 2018-10-26 LAB — SARS CORONAVIRUS 2 (TAT 6-24 HRS): SARS Coronavirus 2: NEGATIVE

## 2018-10-26 NOTE — Telephone Encounter (Signed)
I also called the pharmacy and spoke to Tripp and he said they are showing the pt has Linzess 145 mcg refills x 6. I am waiting on a return call from pt to discuss.

## 2018-10-26 NOTE — Telephone Encounter (Signed)
LMOM for a return call.  

## 2018-10-26 NOTE — Telephone Encounter (Signed)
Pt returned call. Said she had gotten Linzess 72 mg from here a long time ago. She is having trouble with gall bladder now and will be having it removed soon. She said she still needs a little something but she definitely does not need the 145 mg now. She apologized for not calling us first.  Magda Paganini is now aware.

## 2018-10-26 NOTE — Telephone Encounter (Signed)
Can we verify what dose she takes? SLF OV and last Refill we did was for 113mcg but this one is for 62mcg

## 2018-10-28 ENCOUNTER — Encounter (HOSPITAL_COMMUNITY)
Admission: RE | Disposition: A | Discharge status: Home / Self Care | Payer: Self-pay | Source: Home / Self Care | Attending: General Surgery

## 2018-10-28 ENCOUNTER — Ambulatory Visit (HOSPITAL_COMMUNITY): Payer: Commercial Managed Care - PPO | Admitting: Anesthesiology

## 2018-10-28 ENCOUNTER — Observation Stay (HOSPITAL_COMMUNITY)
Admission: RE | Admit: 2018-10-28 | Discharge: 2018-10-29 | Disposition: A | Discharge status: Home / Self Care | Payer: Commercial Managed Care - PPO | Attending: General Surgery | Admitting: General Surgery

## 2018-10-28 ENCOUNTER — Ambulatory Visit (HOSPITAL_COMMUNITY): Payer: Commercial Managed Care - PPO

## 2018-10-28 ENCOUNTER — Other Ambulatory Visit: Payer: Self-pay

## 2018-10-28 ENCOUNTER — Encounter (HOSPITAL_COMMUNITY): Payer: Self-pay

## 2018-10-28 DIAGNOSIS — Z79899 Other long term (current) drug therapy: Secondary | ICD-10-CM | POA: Diagnosis not present

## 2018-10-28 DIAGNOSIS — K810 Acute cholecystitis: Secondary | ICD-10-CM | POA: Diagnosis not present

## 2018-10-28 DIAGNOSIS — Z881 Allergy status to other antibiotic agents status: Secondary | ICD-10-CM | POA: Diagnosis not present

## 2018-10-28 DIAGNOSIS — M3500 Sicca syndrome, unspecified: Secondary | ICD-10-CM | POA: Diagnosis not present

## 2018-10-28 DIAGNOSIS — K851 Biliary acute pancreatitis without necrosis or infection: Principal | ICD-10-CM | POA: Insufficient documentation

## 2018-10-28 DIAGNOSIS — F419 Anxiety disorder, unspecified: Secondary | ICD-10-CM | POA: Insufficient documentation

## 2018-10-28 DIAGNOSIS — I129 Hypertensive chronic kidney disease with stage 1 through stage 4 chronic kidney disease, or unspecified chronic kidney disease: Secondary | ICD-10-CM | POA: Diagnosis not present

## 2018-10-28 DIAGNOSIS — D62 Acute posthemorrhagic anemia: Secondary | ICD-10-CM

## 2018-10-28 DIAGNOSIS — Z8673 Personal history of transient ischemic attack (TIA), and cerebral infarction without residual deficits: Secondary | ICD-10-CM | POA: Insufficient documentation

## 2018-10-28 DIAGNOSIS — Z7689 Persons encountering health services in other specified circumstances: Secondary | ICD-10-CM

## 2018-10-28 DIAGNOSIS — K219 Gastro-esophageal reflux disease without esophagitis: Secondary | ICD-10-CM | POA: Insufficient documentation

## 2018-10-28 DIAGNOSIS — L4050 Arthropathic psoriasis, unspecified: Secondary | ICD-10-CM | POA: Diagnosis not present

## 2018-10-28 DIAGNOSIS — N189 Chronic kidney disease, unspecified: Secondary | ICD-10-CM | POA: Diagnosis not present

## 2018-10-28 DIAGNOSIS — K8012 Calculus of gallbladder with acute and chronic cholecystitis without obstruction: Secondary | ICD-10-CM | POA: Diagnosis not present

## 2018-10-28 DIAGNOSIS — Z888 Allergy status to other drugs, medicaments and biological substances status: Secondary | ICD-10-CM | POA: Diagnosis not present

## 2018-10-28 DIAGNOSIS — F329 Major depressive disorder, single episode, unspecified: Secondary | ICD-10-CM | POA: Diagnosis not present

## 2018-10-28 DIAGNOSIS — Z87891 Personal history of nicotine dependence: Secondary | ICD-10-CM | POA: Insufficient documentation

## 2018-10-28 DIAGNOSIS — Z7982 Long term (current) use of aspirin: Secondary | ICD-10-CM | POA: Diagnosis not present

## 2018-10-28 DIAGNOSIS — K7460 Unspecified cirrhosis of liver: Secondary | ICD-10-CM | POA: Diagnosis not present

## 2018-10-28 HISTORY — PX: CHOLECYSTECTOMY: SHX55

## 2018-10-28 SURGERY — LAPAROSCOPIC CHOLECYSTECTOMY WITH INTRAOPERATIVE CHOLANGIOGRAM
Anesthesia: General | Site: Abdomen

## 2018-10-28 MED ORDER — ONDANSETRON 4 MG PO TBDP: 4.0000 mg | ORAL_TABLET | Freq: Four times a day (QID) | ORAL | Status: DC | PRN

## 2018-10-28 MED ORDER — BUPIVACAINE HCL (PF) 0.5 % IJ SOLN
INTRAMUSCULAR | Status: DC | PRN
  Administered 2018-10-28: 30 mL

## 2018-10-28 MED ORDER — SUCCINYLCHOLINE CHLORIDE 200 MG/10ML IV SOSY
PREFILLED_SYRINGE | INTRAVENOUS | Status: AC
  Filled 2018-10-28: qty 10

## 2018-10-28 MED ORDER — ROCURONIUM BROMIDE 100 MG/10ML IV SOLN
INTRAVENOUS | Status: DC | PRN
  Administered 2018-10-28: 20 mg via INTRAVENOUS
  Administered 2018-10-28 (×5): 5 mg via INTRAVENOUS

## 2018-10-28 MED ORDER — LORATADINE 10 MG PO TABS
10.0000 mg | ORAL_TABLET | Freq: Every day | ORAL | Status: DC
  Administered 2018-10-29: 10 mg via ORAL
  Filled 2018-10-28: qty 1

## 2018-10-28 MED ORDER — ONDANSETRON HCL 4 MG/2ML IJ SOLN
INTRAMUSCULAR | Status: AC
  Filled 2018-10-28: qty 2

## 2018-10-28 MED ORDER — DEXAMETHASONE SODIUM PHOSPHATE 10 MG/ML IJ SOLN
INTRAMUSCULAR | Status: AC
  Filled 2018-10-28: qty 1

## 2018-10-28 MED ORDER — EPHEDRINE SULFATE 50 MG/ML IJ SOLN
INTRAMUSCULAR | Status: DC | PRN
  Administered 2018-10-28 (×2): 5 mg via INTRAVENOUS

## 2018-10-28 MED ORDER — MIDAZOLAM HCL 2 MG/2ML IJ SOLN
INTRAMUSCULAR | Status: DC | PRN
  Administered 2018-10-28: 1 mg via INTRAVENOUS

## 2018-10-28 MED ORDER — BUPIVACAINE HCL (PF) 0.5 % IJ SOLN
INTRAMUSCULAR | Status: AC
  Filled 2018-10-28: qty 30

## 2018-10-28 MED ORDER — HYDROCODONE-ACETAMINOPHEN 7.5-325 MG PO TABS: 1.0000 | ORAL_TABLET | Freq: Once | ORAL | Status: DC | PRN

## 2018-10-28 MED ORDER — HYDROXYZINE HCL 25 MG PO TABS: 25.0000 mg | ORAL_TABLET | Freq: Four times a day (QID) | ORAL | Status: DC | PRN

## 2018-10-28 MED ORDER — METHOCARBAMOL 500 MG PO TABS: 500.0000 mg | ORAL_TABLET | Freq: Four times a day (QID) | ORAL | Status: DC | PRN

## 2018-10-28 MED ORDER — MORPHINE SULFATE (PF) 2 MG/ML IV SOLN
2.0000 mg | INTRAVENOUS | Status: DC | PRN
  Administered 2018-10-28: 2 mg via INTRAVENOUS
  Filled 2018-10-28: qty 1

## 2018-10-28 MED ORDER — PROPOFOL 10 MG/ML IV BOLUS
INTRAVENOUS | Status: AC
  Filled 2018-10-28: qty 40

## 2018-10-28 MED ORDER — BUPROPION HCL ER (SR) 100 MG PO TB12
100.0000 mg | ORAL_TABLET | Freq: Two times a day (BID) | ORAL | Status: DC
  Administered 2018-10-28 – 2018-10-29 (×2): 100 mg via ORAL
  Filled 2018-10-28 (×2): qty 1

## 2018-10-28 MED ORDER — POLYVINYL ALCOHOL-POVIDONE PF 1.4-0.6 % OP SOLN: 1.0000 [drp] | Freq: Three times a day (TID) | OPHTHALMIC | Status: DC | PRN

## 2018-10-28 MED ORDER — MIDAZOLAM HCL 2 MG/2ML IJ SOLN
INTRAMUSCULAR | Status: AC
  Filled 2018-10-28: qty 2

## 2018-10-28 MED ORDER — LACTATED RINGERS IV SOLN
INTRAVENOUS | Status: DC
  Administered 2018-10-28 (×4): via INTRAVENOUS

## 2018-10-28 MED ORDER — GLYCOPYRROLATE PF 0.2 MG/ML IJ SOSY
PREFILLED_SYRINGE | INTRAMUSCULAR | Status: AC
  Filled 2018-10-28: qty 1

## 2018-10-28 MED ORDER — BUMETANIDE 1 MG PO TABS
1.0000 mg | ORAL_TABLET | Freq: Two times a day (BID) | ORAL | Status: DC
  Administered 2018-10-29: 1 mg via ORAL
  Filled 2018-10-28: qty 1

## 2018-10-28 MED ORDER — 0.9 % SODIUM CHLORIDE (POUR BTL) OPTIME
TOPICAL | Status: DC | PRN
  Administered 2018-10-28: 1000 mL

## 2018-10-28 MED ORDER — POLYVINYL ALCOHOL 1.4 % OP SOLN
1.0000 [drp] | Freq: Two times a day (BID) | OPHTHALMIC | Status: DC
  Administered 2018-10-28 – 2018-10-29 (×2): 1 [drp] via OPHTHALMIC
  Filled 2018-10-28: qty 15

## 2018-10-28 MED ORDER — MECLIZINE HCL 12.5 MG PO TABS: 25.0000 mg | ORAL_TABLET | Freq: Three times a day (TID) | ORAL | Status: DC | PRN

## 2018-10-28 MED ORDER — DIPHENHYDRAMINE HCL 12.5 MG/5ML PO ELIX: 12.5000 mg | ORAL_SOLUTION | Freq: Four times a day (QID) | ORAL | Status: DC | PRN

## 2018-10-28 MED ORDER — ONDANSETRON HCL 4 MG/2ML IJ SOLN: 4.0000 mg | Freq: Four times a day (QID) | INTRAMUSCULAR | Status: DC | PRN

## 2018-10-28 MED ORDER — SEVOFLURANE IN SOLN
RESPIRATORY_TRACT | Status: AC
  Filled 2018-10-28: qty 250

## 2018-10-28 MED ORDER — ROCURONIUM BROMIDE 10 MG/ML (PF) SYRINGE
PREFILLED_SYRINGE | INTRAVENOUS | Status: AC
  Filled 2018-10-28: qty 10

## 2018-10-28 MED ORDER — FAMOTIDINE 20 MG PO TABS
20.0000 mg | ORAL_TABLET | Freq: Every day | ORAL | Status: DC
  Administered 2018-10-28 – 2018-10-29 (×2): 20 mg via ORAL
  Filled 2018-10-28 (×2): qty 1

## 2018-10-28 MED ORDER — DOCUSATE SODIUM 100 MG PO CAPS
100.0000 mg | ORAL_CAPSULE | Freq: Every day | ORAL | Status: DC
  Administered 2018-10-28 – 2018-10-29 (×2): 100 mg via ORAL
  Filled 2018-10-28 (×2): qty 1

## 2018-10-28 MED ORDER — FENTANYL CITRATE (PF) 250 MCG/5ML IJ SOLN
INTRAMUSCULAR | Status: AC
  Filled 2018-10-28: qty 5

## 2018-10-28 MED ORDER — FENTANYL CITRATE (PF) 100 MCG/2ML IJ SOLN
INTRAMUSCULAR | Status: DC | PRN
  Administered 2018-10-28 (×2): 50 ug via INTRAVENOUS
  Administered 2018-10-28 (×4): 25 ug via INTRAVENOUS

## 2018-10-28 MED ORDER — LIFITEGRAST 5 % OP SOLN: 1.0000 [drp] | Freq: Two times a day (BID) | OPHTHALMIC | Status: DC

## 2018-10-28 MED ORDER — LIDOCAINE HCL (CARDIAC) PF 100 MG/5ML IV SOSY
PREFILLED_SYRINGE | INTRAVENOUS | Status: DC | PRN
  Administered 2018-10-28: 60 mg via INTRAVENOUS

## 2018-10-28 MED ORDER — SUCCINYLCHOLINE CHLORIDE 200 MG/10ML IV SOSY
PREFILLED_SYRINGE | INTRAVENOUS | Status: DC | PRN
  Administered 2018-10-28: 140 mg via INTRAVENOUS

## 2018-10-28 MED ORDER — ONDANSETRON HCL 4 MG/2ML IJ SOLN: 4.0000 mg | Freq: Once | INTRAMUSCULAR | Status: DC

## 2018-10-28 MED ORDER — SALINE SPRAY 0.65 % NA SOLN: 1.0000 | Freq: Two times a day (BID) | NASAL | Status: DC | PRN

## 2018-10-28 MED ORDER — CIPROFLOXACIN IN D5W 400 MG/200ML IV SOLN
400.0000 mg | Freq: Once | INTRAVENOUS | Status: AC
  Administered 2018-10-28: 400 mg via INTRAVENOUS

## 2018-10-28 MED ORDER — SODIUM CHLORIDE 0.9 % IR SOLN
Status: DC | PRN
  Administered 2018-10-28: 3000 mL

## 2018-10-28 MED ORDER — PHENYLEPHRINE 40 MCG/ML (10ML) SYRINGE FOR IV PUSH (FOR BLOOD PRESSURE SUPPORT)
PREFILLED_SYRINGE | INTRAVENOUS | Status: AC
  Filled 2018-10-28: qty 10

## 2018-10-28 MED ORDER — IOHEXOL 300 MG/ML  SOLN
INTRAMUSCULAR | Status: DC | PRN
  Administered 2018-10-28: 15 mL

## 2018-10-28 MED ORDER — ONDANSETRON HCL 4 MG/2ML IJ SOLN
INTRAMUSCULAR | Status: DC | PRN
  Administered 2018-10-28: 4 mg via INTRAVENOUS

## 2018-10-28 MED ORDER — CHLORHEXIDINE GLUCONATE CLOTH 2 % EX PADS: 6.0000 | MEDICATED_PAD | Freq: Once | CUTANEOUS | Status: DC

## 2018-10-28 MED ORDER — EPHEDRINE 5 MG/ML INJ
INTRAVENOUS | Status: AC
  Filled 2018-10-28: qty 10

## 2018-10-28 MED ORDER — SIMETHICONE 80 MG PO CHEW
40.0000 mg | CHEWABLE_TABLET | Freq: Four times a day (QID) | ORAL | Status: DC | PRN
  Administered 2018-10-28: 20:00:00 40 mg via ORAL
  Filled 2018-10-28: qty 1

## 2018-10-28 MED ORDER — ESCITALOPRAM OXALATE 10 MG PO TABS
20.0000 mg | ORAL_TABLET | Freq: Every day | ORAL | Status: DC
  Administered 2018-10-28: 20 mg via ORAL
  Filled 2018-10-28: qty 2

## 2018-10-28 MED ORDER — ONDANSETRON HCL 4 MG/2ML IJ SOLN
4.0000 mg | Freq: Once | INTRAMUSCULAR | Status: AC
  Administered 2018-10-28: 4 mg via INTRAVENOUS

## 2018-10-28 MED ORDER — HYDROMORPHONE HCL 1 MG/ML IJ SOLN
0.2500 mg | INTRAMUSCULAR | Status: DC | PRN
  Administered 2018-10-28 (×4): 0.5 mg via INTRAVENOUS
  Filled 2018-10-28 (×4): qty 0.5

## 2018-10-28 MED ORDER — ZOLPIDEM TARTRATE 5 MG PO TABS: 5.0000 mg | ORAL_TABLET | Freq: Every evening | ORAL | Status: DC | PRN

## 2018-10-28 MED ORDER — FLUTICASONE PROPIONATE 50 MCG/ACT NA SUSP
1.0000 | Freq: Every day | NASAL | Status: DC
  Administered 2018-10-28 – 2018-10-29 (×2): 1 via NASAL
  Filled 2018-10-28: qty 16

## 2018-10-28 MED ORDER — MIDAZOLAM HCL 2 MG/2ML IJ SOLN: 0.5000 mg | Freq: Once | INTRAMUSCULAR | Status: DC | PRN

## 2018-10-28 MED ORDER — SUGAMMADEX SODIUM 200 MG/2ML IV SOLN
INTRAVENOUS | Status: DC | PRN
  Administered 2018-10-28: 200 mg via INTRAVENOUS

## 2018-10-28 MED ORDER — CIPROFLOXACIN IN D5W 400 MG/200ML IV SOLN
INTRAVENOUS | Status: AC
  Filled 2018-10-28: qty 200

## 2018-10-28 MED ORDER — HEMOSTATIC AGENTS (NO CHARGE) OPTIME
TOPICAL | Status: DC | PRN
  Administered 2018-10-28 (×2): 1 via TOPICAL

## 2018-10-28 MED ORDER — METOPROLOL TARTRATE 5 MG/5ML IV SOLN: 5.0000 mg | Freq: Four times a day (QID) | INTRAVENOUS | Status: DC | PRN

## 2018-10-28 MED ORDER — OXYCODONE HCL 5 MG PO TABS
5.0000 mg | ORAL_TABLET | ORAL | Status: DC | PRN
  Administered 2018-10-28 – 2018-10-29 (×5): 10 mg via ORAL
  Filled 2018-10-28 (×5): qty 2

## 2018-10-28 MED ORDER — DEXAMETHASONE SODIUM PHOSPHATE 4 MG/ML IJ SOLN
INTRAMUSCULAR | Status: DC | PRN
  Administered 2018-10-28: 4 mg via INTRAVENOUS

## 2018-10-28 MED ORDER — LIDOCAINE 2% (20 MG/ML) 5 ML SYRINGE
INTRAMUSCULAR | Status: AC
  Filled 2018-10-28: qty 5

## 2018-10-28 MED ORDER — PHENYLEPHRINE HCL (PRESSORS) 10 MG/ML IV SOLN
INTRAVENOUS | Status: DC | PRN
  Administered 2018-10-28 (×3): 80 ug via INTRAVENOUS
  Administered 2018-10-28 (×3): 40 ug via INTRAVENOUS
  Administered 2018-10-28 (×3): 80 ug via INTRAVENOUS
  Administered 2018-10-28 (×2): 40 ug via INTRAVENOUS
  Administered 2018-10-28: 80 ug via INTRAVENOUS

## 2018-10-28 MED ORDER — PROPOFOL 10 MG/ML IV BOLUS
INTRAVENOUS | Status: DC | PRN
  Administered 2018-10-28: 20 mg via INTRAVENOUS
  Administered 2018-10-28: 180 mg via INTRAVENOUS
  Administered 2018-10-28: 20 mg via INTRAVENOUS

## 2018-10-28 MED ORDER — DIPHENHYDRAMINE HCL 50 MG/ML IJ SOLN: 12.5000 mg | Freq: Four times a day (QID) | INTRAMUSCULAR | Status: DC | PRN

## 2018-10-28 SURGICAL SUPPLY — 66 items
ADH SKN CLS APL DERMABOND .7 (GAUZE/BANDAGES/DRESSINGS) ×1
APL PRP STRL LF DISP 70% ISPRP (MISCELLANEOUS) ×1
APL SRG 38 LTWT LNG FL B (MISCELLANEOUS) ×1
APPLICATOR ARISTA FLEXITIP XL (MISCELLANEOUS) ×1 IMPLANT
APPLIER CLIP ROT 10 11.4 M/L (STAPLE) ×2
APR CLP MED LRG 11.4X10 (STAPLE) ×1
BLADE SURG 15 STRL LF DISP TIS (BLADE) ×1 IMPLANT
BLADE SURG 15 STRL SS (BLADE) ×2
CATH CHOLANGIOGRAM 4.5FR (CATHETERS) ×2 IMPLANT
CHLORAPREP W/TINT 26 (MISCELLANEOUS) ×2 IMPLANT
CLIP APPLIE ROT 10 11.4 M/L (STAPLE) ×1 IMPLANT
CLOTH BEACON ORANGE TIMEOUT ST (SAFETY) ×2 IMPLANT
COVER LIGHT HANDLE STERIS (MISCELLANEOUS) ×4 IMPLANT
COVER WAND RF STERILE (DRAPES) ×1 IMPLANT
DECANTER SPIKE VIAL GLASS SM (MISCELLANEOUS) ×4 IMPLANT
DERMABOND ADVANCED (GAUZE/BANDAGES/DRESSINGS) ×1
DERMABOND ADVANCED .7 DNX12 (GAUZE/BANDAGES/DRESSINGS) ×1 IMPLANT
DISSECTOR BLUNT TIP ENDO 5MM (MISCELLANEOUS) ×1 IMPLANT
DRAPE C-ARM FOLDED MOBILE STRL (DRAPES) ×2 IMPLANT
ELECT REM PT RETURN 9FT ADLT (ELECTROSURGICAL) ×2
ELECTRODE REM PT RTRN 9FT ADLT (ELECTROSURGICAL) ×1 IMPLANT
EVACUATOR DRAINAGE 10X20 100CC (DRAIN) IMPLANT
EVACUATOR SILICONE 100CC (DRAIN) ×2
FILTER SMOKE EVAC LAPAROSHD (FILTER) ×2 IMPLANT
GLOVE BIO SURGEON STRL SZ 6.5 (GLOVE) ×2 IMPLANT
GLOVE BIO SURGEON STRL SZ7 (GLOVE) ×1 IMPLANT
GLOVE BIOGEL PI IND STRL 6.5 (GLOVE) ×1 IMPLANT
GLOVE BIOGEL PI IND STRL 7.0 (GLOVE) ×3 IMPLANT
GLOVE BIOGEL PI INDICATOR 6.5 (GLOVE) ×2
GLOVE BIOGEL PI INDICATOR 7.0 (GLOVE) ×3
GLOVE SURG SS PI 6.5 STRL IVOR (GLOVE) ×1 IMPLANT
GLOVE SURG SS PI 7.5 STRL IVOR (GLOVE) ×4 IMPLANT
GOWN STRL REUS W/ TWL LRG LVL3 (GOWN DISPOSABLE) ×2 IMPLANT
GOWN STRL REUS W/ TWL XL LVL3 (GOWN DISPOSABLE) ×1 IMPLANT
GOWN STRL REUS W/TWL LRG LVL3 (GOWN DISPOSABLE) ×4
GOWN STRL REUS W/TWL XL LVL3 (GOWN DISPOSABLE) ×2
HEMOSTAT ARISTA ABSORB 3G PWDR (HEMOSTASIS) ×1 IMPLANT
HEMOSTAT SNOW SURGICEL 2X4 (HEMOSTASIS) ×2 IMPLANT
INST SET LAPROSCOPIC AP (KITS) ×3 IMPLANT
IV NS IRRIG 3000ML ARTHROMATIC (IV SOLUTION) ×1 IMPLANT
KIT TURNOVER KIT A (KITS) ×2 IMPLANT
MANIFOLD NEPTUNE II (INSTRUMENTS) ×2 IMPLANT
NEEDLE INSUFFLATION 120MM (ENDOMECHANICALS) ×2 IMPLANT
NS IRRIG 1000ML POUR BTL (IV SOLUTION) ×2 IMPLANT
PACK LAP CHOLE LZT030E (CUSTOM PROCEDURE TRAY) ×2 IMPLANT
PAD ARMBOARD 7.5X6 YLW CONV (MISCELLANEOUS) ×2 IMPLANT
SET BASIN LINEN APH (SET/KITS/TRAYS/PACK) ×2 IMPLANT
SET TUBE IRRIG SUCTION NO TIP (IRRIGATION / IRRIGATOR) ×2 IMPLANT
SLEEVE ENDOPATH XCEL 5M (ENDOMECHANICALS) ×2 IMPLANT
SPONGE DRAIN TRACH 4X4 STRL 2S (GAUZE/BANDAGES/DRESSINGS) ×1 IMPLANT
STOPCOCK 4 WAY LG BORE MALE ST (IV SETS) ×2 IMPLANT
SUT ETHILON 3 0 FSL (SUTURE) ×1 IMPLANT
SUT MNCRL AB 4-0 PS2 18 (SUTURE) ×2 IMPLANT
SUT VICRYL 0 UR6 27IN ABS (SUTURE) ×2 IMPLANT
SYR 20CC LL (SYRINGE) ×2 IMPLANT
SYR 30ML LL (SYRINGE) ×2 IMPLANT
SYR CONTROL 10ML LL (SYRINGE) ×2 IMPLANT
SYS BAG RETRIEVAL 10MM (BASKET) ×4
SYSTEM BAG RETRIEVAL 10MM (BASKET) ×1 IMPLANT
TAPE PAPER 2X10 WHT MICROPORE (GAUZE/BANDAGES/DRESSINGS) ×1 IMPLANT
TROCAR ENDO BLADELESS 11MM (ENDOMECHANICALS) ×2 IMPLANT
TROCAR XCEL NON-BLD 5MMX100MML (ENDOMECHANICALS) ×2 IMPLANT
TROCAR XCEL UNIV SLVE 11M 100M (ENDOMECHANICALS) ×2 IMPLANT
TUBING INSUFFLATION (TUBING) ×2 IMPLANT
WARMER LAPAROSCOPE (MISCELLANEOUS) ×2 IMPLANT
YANKAUER SUCT 12FT TUBE ARGYLE (SUCTIONS) ×2 IMPLANT

## 2018-10-28 NOTE — Transfer of Care (Signed)
Immediate Anesthesia Transfer of Care Note  Patient: [facility]  Procedure(s) Performed: LAPAROSCOPIC CHOLECYSTECTOMY WITH INTRAOPERATIVE CHOLANGIOGRAM (N/A Abdomen)  Patient Location: PACU  Anesthesia Type:General  Level of Consciousness: awake, alert  and patient cooperative  Airway & Oxygen Therapy: Patient Spontanous Breathing  Post-op Assessment: Report given to RN and Post -op Vital signs reviewed and stable  Post vital signs: Reviewed and stable  Last Vitals:  Vitals Value Taken Time  BP    Temp    Pulse 41 10/28/18 1047  Resp    SpO2 93 % 10/28/18 1047  Vitals shown include unvalidated device data.  Last Pain:  Vitals:   10/28/18 0650  TempSrc: Oral  PainSc: 0-No pain      Patients Stated Pain Goal: 8 (31/74/09 9278)  Complications: No apparent anesthesia complications

## 2018-10-28 NOTE — Progress Notes (Signed)
Pt's husband updated about surgery going well and pt will be admitted to floor after recovery. Husband verbalized understanding.

## 2018-10-28 NOTE — H&P (Signed)
[facility] History and Physical  Reason for Referral: Gallstone Pancreatitis  Referring Physician:  Dr. Huey Romans Deanna Thompson is a 63 y.o. female.  HPI: Deanna Thompson is a 63 yo with a history of cirrhosis, HTN, CKD, Sjogren syndrome, prior CVA, brain aneurysm s/p coil who presented to the ED last week with vomiting, loose stools and RUQ pain that started suddenly. She had a CT that demonstrated concern for acute cholecystitis and a lipase that was elevated to 258.  She had an ultrasound US demonstrated stones with CBD 64mm and no acute cholecystitis, so it was thought she had some degree of gallstone pancreatitis from passing a stone.  Her bilirubin was trending down and she was feeling better so she was allowed to go home with plans for outpatient laparoscopic cholecystectomy with cholangiogram Monday. This unfortunately could not be arranged with her insurance, and had to be rescheduled for now.   Since her release from the hospital she reports feeling better. Her pain has resolved. She still has some nausea.  She has been eating and drinking. She has been having her normal stools with Linzess. She reports with her prior surgeries doing well and having no issues.  She denies any cardiac issues.   Past Medical History:  Diagnosis Date  . Abdominal hernia   . Abscess of pulp of tooth 12/12/14   left side  . Anxiety   . Arthritis    back  and down flank  right side more than left  . B12 deficiency 06/12/2015  . Brain aneurysm    METAL COIL PREVENTS MRI IMAGING and CLIP  . Cirrhosis ([place])    Presumably alcoholic cirrhosis  . Complication of anesthesia   . Depression   . ETOH abuse    Quit in 07/2011   . Fatigue    due to Sjogren's disease  . Folate deficiency 06/30/2016  . GERD (gastroesophageal reflux disease)   . HTN (hypertension)    no longer medicated, dated on 04/25/12  . IBS (irritable bowel syndrome)   . Iron deficiency anemia due to chronic blood loss    . Myofascial pain   . Panic attack   . PONV (postoperative nausea and vomiting)   . Psoriatic arthritis ([place])   . Right inguinal hernia 08/16/2012  . Seizures ([place]) 1997   1 seizure from coil insertion but no more seizures and no meds for seizures  . Sjogren's disease ([place])   . [place] HEAD 02/03/2007   Qualifier: Diagnosis of  By: Aline Brochure MD, Dorothyann Peng    . 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([place])     Past Surgical History:  Procedure Laterality Date  . AGILE CAPSULE N/A 06/21/2014   Procedure: AGILE CAPSULE;  Surgeon: Danie Binder, MD;  [facility];  Service: Endoscopy;  Laterality: N/A;  0700  . BIOPSY  10/13/2011   SLF: mild gastritis/duodenal polypoid lesion   . CEREBRAL ANEURYSM REPAIR  1997  . COLONOSCOPY  10/13/2011   BDZ:HGDJMEQA hemorrhoids/varices rectal/lesion in ascending colon(HYPERPLASTIC POLYP)  . COLONOSCOPY N/A 01/17/2013   SLF: 1. Internal hemorrhoids 2. Ascending colon polyps 3.  Rectal varices.   . COLONOSCOPY N/A 08/10/2016   Dr. Oneida Alar: redundant left colon. external and internal hemorhroids. 5-10 year surveillance  . ESOPHAGOGASTRODUODENOSCOPY N/A 06/29/2014   SLF: 1. anemia due to polypoid lesions in teh antrum, and duodenal polyps 2. single duodenum AVM  . FLEXIBLE SIGMOIDOSCOPY N/A 01/21/2016   Procedure: FLEXIBLE SIGMOIDOSCOPY;  Surgeon: Danie Binder,  MD;  [facility];  Service: Endoscopy;  Laterality: N/A;  245  . GIVENS CAPSULE STUDY N/A 06/29/2014   Procedure: GIVENS CAPSULE STUDY;  Surgeon: Danie Binder, MD;  [facility];  Service: Endoscopy;  Laterality: N/A;  . GIVENS CAPSULE STUDY N/A 07/13/2014   Procedure: GIVENS CAPSULE STUDY;  Surgeon: Danie Binder, MD;  [facility];  Service: Endoscopy;  Laterality: N/A;  700  . HEMORRHOID BANDING N/A 01/21/2016   Procedure: HEMORRHOID BANDING;  Surgeon: Danie Binder, MD;  [facility];  Service: Endoscopy;  Laterality: N/A;  .  INGUINAL HERNIA REPAIR Right 08/28/2013   Procedure: RIGHT NGUINAL HERNIORRHAPHY WITH MESH;  Surgeon: Jamesetta So, MD;  Location: [facility];  Service: General;  Laterality: Right;  . INSERTION OF MESH N/A 06/09/2013   Procedure: INSERTION OF MESH;  Surgeon: Jamesetta So, MD;  Location: [facility];  Service: General;  Laterality: N/A;  . INSERTION OF MESH Right 08/28/2013   Procedure: INSERTION OF MESH;  Surgeon: Jamesetta So, MD;  Location: [facility];  Service: General;  Laterality: Right;  . PARACENTESIS  Feb 2014   10.7 liters  . TOOTH EXTRACTION Left 12/12/14  . UMBILICAL HERNIA REPAIR N/A 06/09/2013   Procedure: UMBILICAL HERNIORRHAPHY WITH MESH;  Surgeon: Jamesetta So, MD;  Location: [facility];  Service: General;  Laterality: N/A;    Family History  Problem Relation Age of Onset  . Cirrhosis Mother        nash  . COPD Father   . Heart disease Father   . Asthma Other   . Heart disease Other   . Colon cancer Neg Hx   . Inflammatory bowel disease Neg Hx     Social History   Tobacco Use  . Smoking status: Former Smoker    Packs/day: 2.00    Years: 12.00    Pack years: 24.00    Types: Cigarettes    Quit date: 03/31/1995    Years since quitting: 23.5  . Smokeless tobacco: Never Used  . Tobacco comment: quit in 1997  Substance Use Topics  . Alcohol use: No    Comment: Bottle of wine a day for years, but quit in 07/2011  . Drug use: No    Medications: I have reviewed the patient's current medications. Medications Prior to Admission  Medication Sig Dispense Refill  . aspirin 325 MG tablet Take 325 mg by mouth daily.    Marland Kitchen atorvastatin (LIPITOR) 40 MG tablet TAKE (1) TABLET BY MOUTH ONCE DAILY 30 tablet 5  . bumetanide (BUMEX) 1 MG tablet TAKE (1) TABLET BY MOUTH (2) TIMES DAILY. 180 tablet 3  . buPROPion (WELLBUTRIN SR) 100 MG 12 hr tablet TAKE (1) TABLET BY MOUTH TWICE DAILY. 60 tablet 5  . docusate sodium (COLACE) 100 MG capsule Take 100 mg by mouth daily.     Marland Kitchen escitalopram  (LEXAPRO) 20 MG tablet TAKE (1) TABLET BY MOUTH AT BEDTIME. 30 tablet 5  . famotidine (PEPCID) 20 MG tablet 1 PO BID MON-FRI 60 tablet 11  . fluticasone (FLONASE) 50 MCG/ACT nasal spray Place 1 spray into both nostrils See admin instructions. Instill 1 spray into each nostril every morning, may also use later in the day as needed for sinus congestion    . hydrOXYzine (ATARAX/VISTARIL) 25 MG tablet Take one tablet by mouth every 6 hrs prn itching 36 tablet 0  . Lifitegrast (XIIDRA) 5 % SOLN Place 1 drop into both  eyes 2 (two) times daily.    Marland Kitchen LINZESS 72 MCG capsule TAKE (1) CAPSULE BY MOUTH EVERY DAY BEFORE BREAKFAST. 90 capsule 3  . loratadine (CLARITIN) 10 MG tablet Take 10 mg by mouth daily.    Marland Kitchen losartan (COZAAR) 25 MG tablet TAKE (1) TABLET BY MOUTH ONCE DAILY. 30 tablet 5  . meclizine (ANTIVERT) 25 MG tablet Take 1 tablet (25 mg total) by mouth 3 (three) times daily as needed for dizziness. 20 tablet 0  . Multiple Vitamin (MULTIVITAMIN WITH MINERALS) TABS tablet Take 1 tablet by mouth daily.    . ondansetron (ZOFRAN) 4 MG tablet Take 1 tablet (4 mg total) by mouth every 8 (eight) hours as needed for nausea or vomiting. 12 tablet 0  . Polyvinyl Alcohol-Povidone (REFRESH OP) Place 1 drop into both eyes 3 (three) times daily as needed (dry eyes).    . Simethicone (GAS-X PO) Take 1 tablet by mouth 2 (two) times daily as needed (bloating/flatulence).    . sodium chloride (OCEAN) 0.65 % nasal spray Place 1 spray into the nose 2 (two) times daily as needed for congestion.     Marland Kitchen spironolactone (ALDACTONE) 100 MG tablet TAKE (1) TABLET BY MOUTH TWICE DAILY. 180 tablet 0  . Tofacitinib Citrate ER (XELJANZ XR) 11 MG TB24 Take 1 tablet by mouth daily. 1/2 tab daily      Current Facility-Administered Medications  Medication Dose Route Frequency Provider Last Rate Last Dose  . Chlorhexidine Gluconate Cloth 2 % PADS 6 each  6 each Topical Once Aviva Signs, MD       And  . Chlorhexidine Gluconate Cloth  2 % PADS 6 each  6 each Topical Once Aviva Signs, MD      . ciprofloxacin (CIPRO) IVPB 400 mg  400 mg Intravenous Once Virl Cagey, MD      . lactated ringers infusion   Intravenous Continuous Lenice Llamas, MD 50 mL/hr at 10/28/18 7096     Allergies  Allergen Reactions  . Omeprazole Shortness Of Breath  . Orencia [Abatacept] Anaphylaxis, Hives and Rash  . Other Shortness Of Breath and Rash    Red Meat Causes shortness of breath, rash and flu like symptoms.   . Phenytoin Rash  . Keflex [Cephalexin] Other (See Comments)    Severe yeast infection  . Phenergan [Promethazine Hcl] Other (See Comments)    Not in right state of mind, HALLUCINATION     ROS:  A comprehensive review of systems was negative except for: Constitutional: positive for anxeity Gastrointestinal: positive for nausea  Blood pressure 134/70, pulse 69, temperature 98.7 F (37.1 C), temperature source Oral, resp. rate 16, SpO2 98 %. Physical Exam Vitals signs reviewed.  Constitutional:      Appearance: Normal appearance.  HENT:     Head: Normocephalic.     Nose: Nose normal.     Mouth/Throat:     Mouth: Mucous membranes are moist.  Eyes:     Pupils: Pupils are equal, round, and reactive to light.  Neck:     Musculoskeletal: Normal range of motion.  Cardiovascular:     Rate and Rhythm: Normal rate and regular rhythm.  Pulmonary:     Effort: Pulmonary effort is normal.     Breath sounds: Normal breath sounds.  Abdominal:     General: There is no distension.     Palpations: Abdomen is soft.     Tenderness: There is no abdominal tenderness. There is no guarding or rebound.  Musculoskeletal: Normal  range of motion.        General: No swelling.  Skin:    General: Skin is warm and dry.  Neurological:     General: No focal deficit present.     Mental Status: She is alert and oriented to person, place, and time.  Psychiatric:        Mood and Affect: Mood normal.        Behavior: Behavior normal.         Thought Content: Thought content normal.        Judgment: Judgment normal.     Results: CT a/p 09/2018 Personally reviewed large liver with large gallbladder with layering sludge and stones  IMPRESSION: 1. Cholelithiasis with findings concerning for acute cholecystitis. Further evaluation with ultrasound is recommended. 2. Cirrhosis. 3. No bowel obstruction or active inflammation. Normal appendix. 4. No ascites. 5. Aortic Atherosclerosis (ICD10-I70.0).  [place] RUQ 09/2018 IMPRESSION: Layering stones and sludge within wall the gallbladder is upper limits. Gallbladder normal in thickness and there is no sonographic Murphy sign. No sonographic convincing evidence for acute cholecystitis.  Changes of cirrhosis. Assessment & Plan:  Deanna Thompson is a 63 y.o. female with gallstone pancreatitis who needs her gallbladder removed. Plan for laparoscopic cholecystectomy with cholangiogram.   -PLAN: I counseled the patient about the indication, risks and benefits of laparoscopic cholecystectomy.  She understands there is a very small chance for bleeding, infection, injury to normal structures (including common bile duct), conversion to open surgery, persistent symptoms, evolution of postcholecystectomy diarrhea, need for secondary interventions, anesthesia reaction, cardiopulmonary issues and other risks not specifically detailed here. I described the expected recovery, the plan for follow-up and the restrictions during the recovery phase.  All questions were answered. -We discussed the use of dye and Xray for the cholangiogram and need to do this to make sure no stones remain in her duct system.   All questions were answered to the satisfaction of the patient.  Virl Cagey 10/28/2018, 7:27 AM

## 2018-10-28 NOTE — Anesthesia Preprocedure Evaluation (Signed)
Anesthesia Evaluation  Patient identified by MRN, date of birth, ID band Patient awake    Reviewed: Allergy & Precautions, NPO status , Patient's Chart, lab work & pertinent test results  History of Anesthesia Complications (+) PONV  Airway Mallampati: I  TM Distance: >3 FB Neck ROM: Full    Dental no notable dental hx. (+) Teeth Intact   Pulmonary neg pulmonary ROS, former smoker,    Pulmonary exam normal breath sounds clear to auscultation       Cardiovascular Exercise Tolerance: Good hypertension, Pt. on medications negative cardio ROS Normal cardiovascular examI Rhythm:Regular Rate:Normal  Intermittent LBBB seen on ECGs  Denies CP/DOE   Neuro/Psych Anxiety Depression Reports remote Aneurysm clipping with 6 coils placed after -all over 10 years prior  Denies Sz  State slight CVA 07/2017 CVA, No Residual Symptoms negative psych ROS   GI/Hepatic GERD  Medicated and Controlled,(+) Cirrhosis       , NASH   Endo/Other  negative endocrine ROS  Renal/GU Renal InsufficiencyRenal disease  negative genitourinary   Musculoskeletal  (+) Arthritis , Osteoarthritis,  Sjogrens and Psoriatic arthritis    Abdominal   Peds negative pediatric ROS (+)  Hematology negative hematology ROS (+) anemia ,   Anesthesia Other Findings   Reproductive/Obstetrics negative OB ROS                             Anesthesia Physical Anesthesia Plan  ASA: IV  Anesthesia Plan: General   Post-op Pain Management:    Induction: Intravenous  PONV Risk Score and Plan: 2 and Ondansetron, Treatment may vary due to age or medical condition and Dexamethasone  Airway Management Planned: Oral ETT  Additional Equipment:   Intra-op Plan:   Post-operative Plan: Extubation in OR  Informed Consent: I have reviewed the patients History and Physical, chart, labs and discussed the procedure including the risks, benefits  and alternatives for the proposed anesthesia with the patient or authorized representative who has indicated his/her understanding and acceptance.     Dental advisory given  Plan Discussed with: CRNA  Anesthesia Plan Comments: (Plan Full PPE  Plan GETA -WTP with same after Q&A -d/w pt at higher risk related to comorbities -VU WTP)        Anesthesia Quick Evaluation

## 2018-10-28 NOTE — Op Note (Signed)
Operative Note   Preoperative Diagnosis: Gallstone pancreatitis    Postoperative Diagnosis: Gallstone pancreatitis, acute cholecystitis    Procedure(s) Performed: Laparoscopic cholecystectomy with intraoperative cholangiogram    Surgeon: Lanell Matar. Constance Haw, MD   Assistants: No qualified resident was available   Anesthesia: General endotracheal   Anesthesiologist: Dr. Hilaria Ota    Specimens: Gallbladder    Estimated Blood Loss: 500cc   Blood Replacement: None    Complications: None   Indication: [name] is a 63 yo with multiple medical issues including cirrhosis who came to the hospital and was found to have gallstone pancreatitis and some concern for cholecystitis on imaging. She was discharge with plans for quick outpatient surgery with Dr. Arnoldo Morale, but this was delayed due to insurance approval of the surgery.  She had improvement in her symptoms and labs prior to discharge, and it was thought she could be done immediately on Monday. She is now getting her surgery on Friday after insurance approval.  We discussed the risk of surgery including but not limited to bleeding, infection, bile duct injury, need for open surgery, reason for cholangiogram, and risk of bile leak/ biloma formation and her higher risk of bleeding due to cirrhosis. She has opted to proceed.    Operative Findings: Inflamed, distended gallbladder consistent with acute cholecystitis, significant edema and inflammatory rind, cirrhotic fibrotic/ nodular liver, cholangiogram limited due to extravasation but common bile duct that was visualized appeared smooth without defects; friable and fragile cystic duct tore, clips were placed; significant blood loss due to inflammation and cirrhotic liver   Procedure: The patient was taken to the operating room and placed supine. General endotracheal anesthesia was induced. Intravenous antibiotics were administered per protocol. An orogastric tube positioned to decompress the  stomach. The abdomen was prepared and draped in the usual sterile fashion.    A supraumbilical incision was made attempting to be above her prior umbilical hernia repair with mesh.  AVeress technique was utilized to achieve pneumoperitoneum to 15 mmHg with carbon dioxide. A 11 mm optiview port was placed through the supraumbilical region, encountering no mesh, and a 10 mm 0-degree operative laparoscope was introduced. The area underlying the trocar and Veress needle were inspected and without evidence of injury.  Remaining trocars were placed under direct vision. Two 5 mm ports were placed in the right abdomen, between the anterior axillary and midclavicular line.  A final 11 mm port was placed through the mid-epigastrium, near the falciform ligament.   The liver was severely cirrhotic with nodular and fibrotic appearing features.  The gallbladder was distended and inflamed with a thicken inflammatory rind that was very prone to bleeding. In order to get the gallbladder elevated, I had to decompress the gallbladder on the dome.  This helped, but there was still limit to the ability to elevate due to the inflammation.  The gallbladder fundus was elevated cephalad and the infundibulum was retracted to the patient's right.     I peeled away the inflammatory rind as much as possible with blunt dissection and cautery. Care was taken to not rip the liver or evulse the gallbladder due to the cirrhosis.  I was able to dissect medially and lateral on the hepatic bed, freeing the gallbladder enough and clearing the rind to get to the infundibulum,.  There was significant rind in this area and everything bled. The gallbladder wall itself tore with handling, and a few clips were placed on this to prevent spillage of small stones.  I attempted to skeletonize  the gallbladder/cystic duct junction and the small cystic duct tore leaving a stump.  The duct had been very short, but there was a 3 mm rim that I was able to grasp  away from the common bile duct.        The cholangiogram clamp and catheter were used, and I was able to guide the catheter into the cystic stump and clamp it down.  A cholangiogram was ran and shows a duct that appears clear but this was limited due to extravasation that occurred. (Of note on the cholangiogram there is a grasper on the gallbladder dome that is overlying the cholangiogram).  We attempted to clear the duct with saline, and I irrigated to see if additional runs could be made.  The catheter flushed with saline easily, but I could never get a good flow back into the cystic duct or common bile duct after that first run.  I attempted repositioning twice, but did not want to disrupt the small cystic duct stump more.  I asked Dr. Laural Golden to come into the OR to see the cholangiogram in person, and the first run he agreed there appeared to be smooth contours of a duct and no obvious filling defect. The extravasation did limit the view.  I feel like I saw flow into the duodenum but it is difficult to tell. I could not get back flow into the hepatic ducts.   From here we stopped the cholangiogram, and I was able to get two good clips across the cystic duct and did not narrow the common duct.   I continued to clear the space between the cystic duct stump and cystic artery but this rind was very friable. I had to clip this rind to prevent bleeding.    I was able to get around the cystic artery completely and demonstrate the hepatic bed behind clear from the artery indicating the critical view of safety.     The cystic artery was doubly clipped and divided.  There was a small posterior branch in the mesentery that was clipped.  The gallbladder was then dissected from the liver bed with electrocautery using great caution to not enter the liver bed.  At the dome, the gallbladder was so necrotic that the wall and liver were difficult to dissect. I did enter the gallbladder.  Spillage was controlled, and  I was able to get behind the mucus of the wall and dissect it off the liver. In this area I cauterized to ensure no wall remained.  After the gallbladder was taken off the liver, the specimen was placed in an Endopouch bag and left above the liver to allow for use to inspect. Final inspection showed all clips were still in place.  I cauterized some areas on the hepatic bed, and Arista and Surgical Snow were placed in the hepatic bed. A JP drain was brought out of the most lateral 60mm trocar site in order to monitor for a bile leak from the short cystic duct stump that tore.  The epigastric site had to be extended to retrieve the gallbladder.  Trocars were removed and pneumoperitoneum was released.  0 Vicryl fascial sutures were used to close the epigastric and umbilical port sites. Skin incisions were closed with 4-0 Monocryl subcuticular sutures and Dermabond. The patient was awakened from anesthesia and extubated without complication.   I plan to monitor the patient overnight and obtain AM labs.  We will see what happens with the drain, and I will send  her home with the drain with plans to remove on POD 4 as long as no bile has collected.    Curlene Labrum, MD [facility] [address] [place], [place] [address] [phone] (office)

## 2018-10-28 NOTE — Anesthesia Postprocedure Evaluation (Signed)
Anesthesia Post Note  Patient: [facility]  Procedure(s) Performed: LAPAROSCOPIC CHOLECYSTECTOMY WITH INTRAOPERATIVE CHOLANGIOGRAM (N/A Abdomen)  Patient location during evaluation: PACU Anesthesia Type: General Level of consciousness: awake and awake and alert Pain management: pain level controlled Vital Signs Assessment: post-procedure vital signs reviewed and stable Respiratory status: spontaneous breathing Cardiovascular status: stable Anesthetic complications: no     Last Vitals:  Vitals:   10/28/18 0650 10/28/18 1047  BP: 134/70 (!) 115/58  Pulse: 69 (!) 41  Resp: 16 18  Temp: 37.1 C 36.7 C  SpO2: 98% 93%    Last Pain:  Vitals:   10/28/18 0650  TempSrc: Oral  PainSc: 0-No pain                 Everette Rank

## 2018-10-28 NOTE — Anesthesia Procedure Notes (Signed)
Procedure Name: Intubation Date/Time: 10/28/2018 7:48 AM Performed by: Georgeanne Nim, CRNA Pre-anesthesia Checklist: Patient identified, Emergency Drugs available, Suction available, Patient being monitored and Timeout performed Patient Re-evaluated:Patient Re-evaluated prior to induction Oxygen Delivery Method: Circle system utilized Preoxygenation: Pre-oxygenation with 100% oxygen Induction Type: IV induction, Rapid sequence and Cricoid Pressure applied Laryngoscope Size: Mac and 4 Grade View: Grade I Number of attempts: 1 Airway Equipment and Method: Stylet Placement Confirmation: ETT inserted through vocal cords under direct vision,  positive ETCO2,  CO2 detector and breath sounds checked- equal and bilateral Secured at: 20 cm Tube secured with: Tape Dental Injury: Teeth and Oropharynx as per pre-operative assessment

## 2018-10-29 DIAGNOSIS — K851 Biliary acute pancreatitis without necrosis or infection: Secondary | ICD-10-CM | POA: Diagnosis not present

## 2018-10-29 DIAGNOSIS — D62 Acute posthemorrhagic anemia: Secondary | ICD-10-CM

## 2018-10-29 LAB — COMPREHENSIVE METABOLIC PANEL
ALT: 39 U/L (ref 0–44)
AST: 52 U/L — ABNORMAL HIGH (ref 15–41)
Albumin: 2.7 g/dL — ABNORMAL LOW (ref 3.5–5.0)
Alkaline Phosphatase: 103 U/L (ref 38–126)
Anion gap: 11 (ref 5–15)
BUN: 12 mg/dL (ref 8–23)
CO2: 26 mmol/L (ref 22–32)
Calcium: 8.4 mg/dL — ABNORMAL LOW (ref 8.9–10.3)
Chloride: 99 mmol/L (ref 98–111)
Creatinine, Ser: 1.04 mg/dL — ABNORMAL HIGH (ref 0.44–1.00)
GFR calc Af Amer: >60 mL/min (ref >60)
GFR calc non Af Amer: 58 mL/min — ABNORMAL LOW (ref >60)
Glucose, Bld: 151 mg/dL — ABNORMAL HIGH (ref 70–99)
Potassium: 3.5 mmol/L (ref 3.5–5.1)
Sodium: 136 mmol/L (ref 135–145)
Total Bilirubin: 1 mg/dL (ref 0.3–1.2)
Total Protein: 6.6 g/dL (ref 6.5–8.1)

## 2018-10-29 LAB — CBC WITH DIFFERENTIAL/PLATELET
Abs Immature Granulocytes: 0.08 10*3/uL — ABNORMAL HIGH (ref 0.00–0.07)
Basophils Absolute: 0 10*3/uL (ref 0.0–0.1)
Basophils Relative: 0 %
Eosinophils Absolute: 0 10*3/uL (ref 0.0–0.5)
Eosinophils Relative: 0 %
HCT: 28.7 % — ABNORMAL LOW (ref 36.0–46.0)
Hemoglobin: 8.9 g/dL — ABNORMAL LOW (ref 12.0–15.0)
Immature Granulocytes: 1 %
Lymphocytes Relative: 7 %
Lymphs Abs: 0.7 10*3/uL (ref 0.7–4.0)
MCH: 29.1 pg (ref 26.0–34.0)
MCHC: 31 g/dL (ref 30.0–36.0)
MCV: 93.8 fL (ref 80.0–100.0)
Monocytes Absolute: 0.9 10*3/uL (ref 0.1–1.0)
Monocytes Relative: 9 %
Neutro Abs: 8.5 10*3/uL — ABNORMAL HIGH (ref 1.7–7.7)
Neutrophils Relative %: 83 %
Platelets: 258 10*3/uL (ref 150–400)
RBC: 3.06 MIL/uL — ABNORMAL LOW (ref 3.87–5.11)
RDW: 19.3 % — ABNORMAL HIGH (ref 11.5–15.5)
WBC: 10.3 10*3/uL (ref 4.0–10.5)
nRBC: 0 % (ref 0.0–0.2)

## 2018-10-29 MED ORDER — ONDANSETRON 4 MG PO TBDP: 4.0000 mg | ORAL_TABLET | Freq: Four times a day (QID) | ORAL | 0 refills | Status: DC | PRN

## 2018-10-29 MED ORDER — OXYCODONE HCL 5 MG PO TABS: 5.0000 mg | ORAL_TABLET | ORAL | 0 refills | Status: DC | PRN

## 2018-10-29 MED ORDER — ASPIRIN 325 MG PO TABS: 325.0000 mg | ORAL_TABLET | Freq: Every day | ORAL | Status: DC

## 2018-10-29 NOTE — Discharge Summary (Signed)
Physician Discharge Summary  Patient ID: Deanna Thompson MRN: [id number] DOB/AGE: 31-Mar-1955 63 y.o.  Admit date: 10/28/2018 Discharge date: 10/29/2018  Admission Diagnoses: Gallstone pancreatitis   Discharge Diagnoses:  Principal Problem:   Cholecystitis, acute Active Problems:   Gallstone pancreatitis   Postoperative anemia due to acute blood loss   Discharged Condition: good  Hospital Course: Deanna Thompson is a 63 yo who presented to the hospital with gallbladder issues last weekend, and was able to be discharged home with plans for gallbladder removal. Due to insurance approval, her surgery was post poned until Friday, and we were able to take out her gallbladder laparoscopically. She was found to have acute cholecystitis in addition to her gallstone pancreatitis.  An intraoperative cholangiogram was performed that demonstrated no obvious large stones. A drain was left in the hepatic fossa due to concern that the cystic duct stump might not hold clips as it tore during the dissection. She was kept overnight for repeat labs and monitoring to ensure her LFTs were coming down and to check her Hgb as she lost about 1u blood during the surgery.  This resulted in some post operative anemia. Due to her constipation history and constipation with iron, she is going to take her multi vitamin for now and see how she feels over the next several weeks. I warned her she will feel weaker.   Her pain is controlled and she is tolerating a diet. She is ready to go home with close follow up for drain removal.   Consults: None  Significant Diagnostic Studies: Prior US- cholelithiasis, no obvious thickening or fluid, CT with concern for cholecystitis   Treatments: Laparoscopic cholecystectomy with intraoperative cholangiogram 10/28/2018  Discharge Exam: Blood pressure 133/69, pulse 82, temperature 98.4 F (36.9 C), temperature source Oral, resp. rate 16, SpO2 100 %. General appearance: alert, cooperative  and no distress Resp: normal work of breathing GI: soft, mildly distended, port sites c/d/i with dermabond, some bruising, no erythema, JP with SS fluid Extremities: extremities normal, atraumatic, no cyanosis or edema  CBC Latest Ref Rng & Units 10/29/2018 10/22/2018 10/21/2018  WBC 4.0 - 10.5 K/uL 10.3 5.3 7.4  Hemoglobin 12.0 - 15.0 g/dL 8.9(L) 10.5(L) 11.0(L)  Hematocrit 36.0 - 46.0 % 28.7(L) 33.3(L) 34.2(L)  Platelets 150 - 400 K/uL 258 162 186    CMP Latest Ref Rng & Units 10/29/2018 10/22/2018 10/21/2018  Glucose 70 - 99 mg/dL 151(H) 121(H) 136(H)  BUN 8 - 23 mg/dL 12 10 15   Creatinine 0.44 - 1.00 mg/dL 1.04(H) 0.88 1.01(H)  Sodium 135 - 145 mmol/L 136 139 138  Potassium 3.5 - 5.1 mmol/L 3.5 3.8 3.6  Chloride 98 - 111 mmol/L 99 107 106  CO2 22 - 32 mmol/L 26 25 23   Calcium 8.9 - 10.3 mg/dL 8.4(L) 8.7(L) 8.7(L)  Total Protein 6.5 - 8.1 g/dL 6.6 6.4(L) 6.7  Total Bilirubin 0.3 - 1.2 mg/dL 1.0 2.6(H) 3.8(H)  Alkaline Phos 38 - 126 U/L 103 146(H) 166(H)  AST 15 - 41 U/L 52(H) 49(H) 77(H)  ALT 0 - 44 U/L 39 67(H) 91(H)    Disposition: Discharge disposition: 01-Home or Self Care       Discharge Instructions    Call MD for:  difficulty breathing, headache or visual disturbances   Complete by: As directed    Call MD for:  extreme fatigue   Complete by: As directed    Call MD for:  persistant dizziness or light-headedness   Complete by: As directed    Call  MD for:  persistant nausea and vomiting   Complete by: As directed    Call MD for:  redness, tenderness, or signs of infection (pain, swelling, redness, odor or green/yellow discharge around incision site)   Complete by: As directed    Call MD for:  severe uncontrolled pain   Complete by: As directed    Call MD for:  temperature >100.4   Complete by: As directed    Increase activity slowly   Complete by: As directed      Allergies as of 10/29/2018      Reactions   Omeprazole Shortness Of Breath   Orencia [abatacept]  Anaphylaxis, Hives, Rash   Other Shortness Of Breath, Rash   Red Meat Causes shortness of breath, rash and flu like symptoms.    Phenytoin Rash   Keflex [cephalexin] Other (See Comments)   Severe yeast infection   Phenergan [promethazine Hcl] Other (See Comments)   Not in right state of mind, HALLUCINATION      Medication List    STOP taking these medications   ondansetron 4 MG tablet Commonly known as: ZOFRAN     TAKE these medications   aspirin 325 MG tablet Take 1 tablet (325 mg total) by mouth daily. Start taking on: October 31, 2018 What changed: These instructions start on October 31, 2018. If you are unsure what to do until then, ask your doctor or other care provider.   atorvastatin 40 MG tablet Commonly known as: LIPITOR TAKE (1) TABLET BY MOUTH ONCE DAILY   bumetanide 1 MG tablet Commonly known as: BUMEX TAKE (1) TABLET BY MOUTH (2) TIMES DAILY.   buPROPion 100 MG 12 hr tablet Commonly known as: WELLBUTRIN SR TAKE (1) TABLET BY MOUTH TWICE DAILY.   docusate sodium 100 MG capsule Commonly known as: COLACE Take 100 mg by mouth daily.   escitalopram 20 MG tablet Commonly known as: LEXAPRO TAKE (1) TABLET BY MOUTH AT BEDTIME.   famotidine 20 MG tablet Commonly known as: Pepcid 1 PO BID MON-FRI   fluticasone 50 MCG/ACT nasal spray Commonly known as: FLONASE Place 1 spray into both nostrils See admin instructions. Instill 1 spray into each nostril every morning, may also use later in the day as needed for sinus congestion   GAS-X PO Take 1 tablet by mouth 2 (two) times daily as needed (bloating/flatulence).   hydrOXYzine 25 MG tablet Commonly known as: ATARAX/VISTARIL Take one tablet by mouth every 6 hrs prn itching   Linzess 72 MCG capsule Generic drug: linaclotide TAKE (1) CAPSULE BY MOUTH EVERY DAY BEFORE BREAKFAST.   loratadine 10 MG tablet Commonly known as: CLARITIN Take 10 mg by mouth daily.   losartan 25 MG tablet Commonly known as:  COZAAR TAKE (1) TABLET BY MOUTH ONCE DAILY.   meclizine 25 MG tablet Commonly known as: ANTIVERT Take 1 tablet (25 mg total) by mouth 3 (three) times daily as needed for dizziness.   multivitamin with minerals Tabs tablet Take 1 tablet by mouth daily.   ondansetron 4 MG disintegrating tablet Commonly known as: ZOFRAN-ODT Take 1 tablet (4 mg total) by mouth every 6 (six) hours as needed for nausea.   oxyCODONE 5 MG immediate release tablet Commonly known as: Oxy IR/ROXICODONE Take 1 tablet (5 mg total) by mouth every 4 (four) hours as needed for severe pain or breakthrough pain.   REFRESH OP Place 1 drop into both eyes 3 (three) times daily as needed (dry eyes).   sodium chloride 0.65 % nasal  spray Commonly known as: OCEAN Place 1 spray into the nose 2 (two) times daily as needed for congestion.   spironolactone 100 MG tablet Commonly known as: ALDACTONE TAKE (1) TABLET BY MOUTH TWICE DAILY.   Xeljanz XR 11 MG Tb24 Generic drug: Tofacitinib Citrate ER Take 1 tablet by mouth daily. 1/2 tab daily   Xiidra 5 % Soln Generic drug: Lifitegrast Place 1 drop into both eyes 2 (two) times daily.      Follow-up Information    Virl Cagey, MD On 11/01/2018.   Specialty: General Surgery Why: drain removal  Contact information: [address] [place]  [address] [phone]           Signed: Virl Cagey 10/29/2018, 1:12 PM

## 2018-10-29 NOTE — Discharge Instructions (Signed)
Discharge Laparoscopic Surgery Instructions:  Common Complaints: Right shoulder pain is common after laparoscopic surgery. This is secondary to the gas used in the surgery being trapped under the diaphragm.  Walk to help your body absorb the gas. This will improve in a few days. Pain at the port sites are common, especially the larger port sites. This will improve with time.  Some nausea is common and poor appetite. The main goal is to stay hydrated the first few days after surgery.   Diet/ Activity: Diet as tolerated. You may not have an appetite, but it is important to stay hydrated. Drink 64 ounces of water a day. Your appetite will return with time.  Shower per your regular routine daily.  Do not take hot showers. Take warm showers that are less than 10 minutes. Rest and listen to your body, but do not remain in bed all day.  Walk everyday for at least 15-20 minutes. Deep cough and move around every 1-2 hours in the first few days after surgery.  Do not lift > 10 lbs, perform excessive bending, pushing, pulling, squatting for 1-2 weeks after surgery.  Do not pick at the dermabond glue on your incision sites.  This glue film will remain in place for 1-2 weeks and will start to peel off.  Do not place lotions or balms on your incision unless instructed to specifically by Dr. Constance Haw.   Medication: Do not restart aspirin until Monday 10/31/18.  Take tylenol and ibuprofen as needed for pain control, alternating every 4-6 hours.  Example:  Tylenol 1000mg  @ 6am, 12noon, 6pm, 46midnight (Do not exceed 4000mg  of tylenol a day). Ibuprofen 800mg  @ 9am, 3pm, 9pm, 3am (Do not exceed 3600mg  of ibuprofen a day).  Take Roxicodone for breakthrough pain every 4 hours.  Take Colace for constipation related to narcotic pain medication. If you do not have a bowel movement in 2 days, take Miralax over the counter.  Take your Linzess if needed. Drink plenty of water to also prevent constipation.   JP drain  Care: Please keep the drain clean and dry. Replace the gauze/ tape around the drain if it gets dirty or wet/ saturated. Please do not mess with or cut the stitch that is keeping the drain in place. Secure the drain to your clothes so that it does not get dislodged.  You may want to wear a binder around your abdomen (girdle) at night for sleeping if you are worried about it getting pulled out.  Please record the output from the drain daily including the color and the amount in milliliters.  Please keep the drain covered with plastic and tape when you shower so that it does not get wet.   Contact Information: If you have questions or concerns, please call our office, [phone], Monday- Thursday 8AM-5PM and Friday 8AM-12Noon.  If it is after hours or on the weekend, please call [facility], [phone], and ask to speak to the surgeon on call for Dr. Constance Haw at [facility].    Laparoscopic Cholecystectomy, Care After This sheet gives you information about how to care for yourself after your procedure. Your doctor may also give you more specific instructions. If you have problems or questions, contact your doctor. Follow these instructions at home: Care for cuts from surgery (incisions)   Follow instructions from your doctor about how to take care of your cuts from surgery. Make sure you: ? Wash your hands with soap and water before you change your bandage (dressing).  If you cannot use soap and water, use hand sanitizer. ? Change your bandage as told by your doctor. ? Leave stitches (sutures), skin glue, or skin tape (adhesive) strips in place. They may need to stay in place for 2 weeks or longer. If tape strips get loose and curl up, you may trim the loose edges. Do not remove tape strips completely unless your doctor says it is okay.  Do not take baths, swim, or use a hot tub until your doctor says it is okay.   You may shower.  Check your surgical cut area every day for signs of  infection. Check for: ? More redness, swelling, or pain. ? More fluid or blood. ? Warmth. ? Pus or a bad smell. Activity  Do not drive or use heavy machinery while taking prescription pain medicine.  Do not lift anything that is heavier than 10 lb (4.5 kg) until your doctor says it is okay.  Do not play contact sports until your doctor says it is okay.  Do not drive for 24 hours if you were given a medicine to help you relax (sedative).  Rest as needed. Do not return to work or school until your doctor says it is okay. General instructions  Take over-the-counter and prescription medicines only as told by your doctor.  To prevent or treat constipation while you are taking prescription pain medicine, your doctor may recommend that you: ? Drink enough fluid to keep your pee (urine) clear or pale yellow. ? Take over-the-counter or prescription medicines. ? Eat foods that are high in fiber, such as fresh fruits and vegetables, whole grains, and beans. ? Limit foods that are high in fat and processed sugars, such as fried and sweet foods. Contact a doctor if:  You develop a rash.  You have more redness, swelling, or pain around your surgical cuts.  You have more fluid or blood coming from your surgical cuts.  Your surgical cuts feel warm to the touch.  You have pus or a bad smell coming from your surgical cuts.  You have a fever.  One or more of your surgical cuts breaks open. Get help right away if:  You have trouble breathing.  You have chest pain.  You have pain that is getting worse in your shoulders.  You faint or feel dizzy when you stand.  You have very bad pain in your belly (abdomen).  You are sick to your stomach (nauseous) for more than one day.  You have throwing up (vomiting) that lasts for more than one day.  You have leg pain. This information is not intended to replace advice given to you by your health care provider. Make sure you discuss any  questions you have with your health care provider. Document Released: 12/24/2007 Document Revised: 02/26/2017 Document Reviewed: 09/02/2015 Elsevier Patient Education  2020 [employer].

## 2018-10-31 ENCOUNTER — Telehealth: Payer: Self-pay

## 2018-10-31 ENCOUNTER — Encounter (HOSPITAL_COMMUNITY): Payer: Self-pay | Admitting: General Surgery

## 2018-10-31 NOTE — Telephone Encounter (Signed)
Pt called to see if I received info to do PA on Linzess 72 mcg.  She is already on Linzess 145 mcg and had not been taking it regular, would sometimes take 1/2 tablet.  She said she is having some constipation and will go back to the 145 mcg and see how it works.  She said to HOLD off of PA for 72 Linzess 72 mcg until she lets me know.

## 2018-11-01 ENCOUNTER — Ambulatory Visit (INDEPENDENT_AMBULATORY_CARE_PROVIDER_SITE_OTHER): Payer: Self-pay | Admitting: General Surgery

## 2018-11-01 ENCOUNTER — Other Ambulatory Visit: Payer: Self-pay

## 2018-11-01 ENCOUNTER — Encounter: Payer: Self-pay | Admitting: General Surgery

## 2018-11-01 ENCOUNTER — Telehealth: Payer: Self-pay

## 2018-11-01 VITALS — BP 108/67 | HR 68 | Temp 96.6°F | Resp 16 | Ht 68.0 in | Wt 246.0 lb

## 2018-11-01 DIAGNOSIS — K810 Acute cholecystitis: Secondary | ICD-10-CM

## 2018-11-01 DIAGNOSIS — K851 Biliary acute pancreatitis without necrosis or infection: Secondary | ICD-10-CM

## 2018-11-01 NOTE — Patient Instructions (Signed)
Activity and diet as tolerated.

## 2018-11-01 NOTE — Telephone Encounter (Signed)
Pt called after receiving letter from our office stating it was time to schedule abdominal u/s. Pt had an u/s last week at [facility] and wants to know will that u/s cover the areas needed for the abdominal u/s our office is wanting? Please advise.

## 2018-11-01 NOTE — Progress Notes (Signed)
[facility] Note   HPI:  63 y.o. Female presents to clinic for post-op follow-up evaluation after her laparoscopic cholecystectomy. Patient reports she is doing well. She has some nausea and low appetite. Her JP drain has been putting out about 50-70 cc of SS fluid. No green or bilious fluid. No fevers or chills. Pain improving. Some constipation, but trying to get her linzess dose right after surgery.   Review of Systems:  No fever or chills Bruising of port sites Constipation All other review of systems: otherwise negative   Vital Signs:  BP 108/67 (BP Location: Left Arm, Patient Position: Sitting, Cuff Size: Normal)   Pulse 68   Temp (!) 96.6 F (35.9 C) (Tympanic)   Resp 16   Ht 5\' 8"  (1.727 m)   Wt 246 lb (111.6 kg)   SpO2 96%   BMI 37.40 kg/m    Physical Exam:  Physical Exam Vitals signs reviewed.  HENT:     Head: Normocephalic.  Cardiovascular:     Rate and Rhythm: Normal rate.  Pulmonary:     Effort: Pulmonary effort is normal.  Abdominal:     General: There is no distension.     Palpations: Abdomen is soft.     Comments: Bruising at port sites, JP drain with Ss fluid, no suds, no signs of bile leak, removed without issues, dressing place, no erythema or drainage of port sites, appropriately tender  Musculoskeletal:        General: No swelling.  Skin:    General: Skin is warm and dry.  Neurological:     Mental Status: She is alert.     Assessment:  63 y.o. yo Female s/p lap cholecystectomy for acute cholecystitis and gallstone pancreatitis. Doing well. Drain without signs of bile leak.   Plan:  Activity and diet as tolerated. Monitor for signs of jaundice pending any bile leak we did not catch on the JP drain. PRN follow up  All of the above recommendations were discussed with the patient and patient's family, and all of patient's and family's questions were answered to his/her/their expressed satisfaction.  Curlene Labrum,  MD [facility] [address] [place], [place] [phone] (office)

## 2018-11-08 ENCOUNTER — Encounter: Payer: Self-pay | Admitting: Adult Health

## 2018-11-08 ENCOUNTER — Ambulatory Visit (INDEPENDENT_AMBULATORY_CARE_PROVIDER_SITE_OTHER): Payer: Commercial Managed Care - PPO | Admitting: Adult Health

## 2018-11-08 ENCOUNTER — Other Ambulatory Visit: Payer: Self-pay

## 2018-11-08 VITALS — BP 127/77 | HR 71 | Temp 97.1°F | Ht 68.0 in | Wt 241.0 lb

## 2018-11-08 DIAGNOSIS — I10 Essential (primary) hypertension: Secondary | ICD-10-CM

## 2018-11-08 DIAGNOSIS — H8113 Benign paroxysmal vertigo, bilateral: Secondary | ICD-10-CM | POA: Diagnosis not present

## 2018-11-08 DIAGNOSIS — Z8673 Personal history of transient ischemic attack (TIA), and cerebral infarction without residual deficits: Secondary | ICD-10-CM | POA: Diagnosis not present

## 2018-11-08 DIAGNOSIS — E785 Hyperlipidemia, unspecified: Secondary | ICD-10-CM

## 2018-11-08 DIAGNOSIS — R29818 Other symptoms and signs involving the nervous system: Secondary | ICD-10-CM | POA: Diagnosis not present

## 2018-11-08 NOTE — Telephone Encounter (Signed)
PLEASE CALL PT. I REVIEWED [place] JUL 2020 IT SHOWS CIRRHOSIS AND GALLSTONES PRIOR TO HER GB BEING REMOVED. REPEAT US IN 6 MOS.

## 2018-11-08 NOTE — Telephone Encounter (Signed)
Patient aware. [name] please NIC [place] in 6 months

## 2018-11-08 NOTE — Patient Instructions (Addendum)
Your Plan:  Continue current treatment plan for secondary stroke prevention  Referral placed for sleep evaluation for potential underlying sleep apnea  Keep a recording of when you have difficulty with an control as far as when it occurs, how often, for how many days, any change emotionally, change in diet, any other symptoms such as weakness or pain or any other symptoms at this time such as headache Please call office with any worsening of the symptoms  Continue to do your vestibular exercises as needed and call office if your symptoms worsen for return to physical therapy  Follow-up in 6 months or call earlier if needed       Thank you for coming to see [place] at [facility]. I hope we have been able to provide you high quality care today.  You may receive a patient satisfaction survey over the next few weeks. We would appreciate your feedback and comments so that we may continue to improve ourselves and the health of our patients.

## 2018-11-08 NOTE — Telephone Encounter (Signed)
PLEASE CALL PT. TO TITRATE DOSE OF LINZESS. OPEN LINZESS CAPSULE AND PLACE IN 4 TSP WATER. STIR IT FOR 30 SECONDS AND TAKE 3 TSP OF THE LIQUID. YOU DO NOT NEED TO TAKE THE GRANULES. THE MEDICINE IS IN THE WATER.

## 2018-11-08 NOTE — Progress Notes (Signed)
[facility] [address] [place]. [place] [address] [phone]       OFFICE FOLLOW UP NOTE  [name] Date of Birth:  1955/07/16 Medical Record Number:  [id number]   Reason for Referral:  hospital follow up  CHIEF COMPLAINT:  Chief Complaint  Patient presents with   Follow-up    6 mon f/u. Alone. Treatment room. Patient mentioned that she still having some vertigo. No dizziness at present.        HPI: 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 Patient does have a history of BPPV, which was short lasting. Patient came to ED with gradual onset of dizziness and then vertigo, nausea/vomiting, diaphoresis as well as binocular diplopia, lasting more than 4 hours and then gradually getting better. Upon admission, her examination benign. CT head reviewed and negative for acute abnormality. CTA head neck showed known left MCA 3 mm aneurysm at bifurcation and left nondominant VA V2 stenosis.  2D echo showed EF of 45-50% (recommended outpatient cardiology follow-up).  A1c 5.8 and LDL 129.  Repeat CT scan was performed 24 hours later and again showed no acute intracranial abnormality.  MRI unable to be obtained due to aneurysm clip.   As patient was not on antithrombotic prior to admission, recommended to start aspirin 81 mg daily.  As LDL was elevated, it was recommended to start Lipitor 40 mg daily.  Patient does have a history of a 3 mm aneurysm is being followed by Dr. Estanislado Pandy.  History of alcoholic cirrhosis and recommended to be carefully monitored by GI with initiation of Lipitor and aspirin.  During admission, LFTs currently within normal  limits.  With symptoms of vertigo, diplopia, and ataxia it was believed that these symptoms were more related to a small brainstem central event as BPPV is not typically associated with diplopia or ataxia lasting for extended amount of time, and vestibular neuritis does not have diplopia but normally lasting longer than this. It was unclear if this event was related to her basilar artery pathology on top of previous coiling and clipping but as a DSA with Dr. Estanislado Pandy was an option, it would not necessarily change management at this time and as her clinical symptoms improved it was decided to hold off on cerebral angiogram at this time. Patient was discharged home in stable condition.   Patient is being seen today for hospital follow-up.  She has been continuing PT for vestibular rehab and has been seeing improvement in her dizziness.  Occasionally she will have increased dizziness with increased visual problems but typically sees this greater when she has increased fatigue.  She states up until yesterday she was about 60% improved and then felt as though she took a slight decrease in improvement.  She does go to our neuro rehab clinic 2 times per week.  Patient states that she feels fatigued frequently but is one that would rather try to push through her fatigue then rest at that time and does see worsening in symptoms.  She continues to take aspirin with mild bruising but no bleeding.  Continues to take Lipitor without side effects myalgias.  Blood pressure today satisfactory 119/69.  Patient will be going for liver ultrasound next week and does have  liver enzyme testing done every 3 to 6 months.  Patient will also be following up with cardiology on 09/16/2017 for repeat echocardiogram for low EF.  Interval History:  Since previous visit, patient was seen at [facility] ED on 10/28/2017 due to complaints of vision changes.  She was unable to receive MRI due to coiling cerebral aneurysm and CT was negative for  acute infarct.  She was diagnosed with possible TIA and recommended increase of aspirin from 81 mg to 325 mg.  Patient was discharged home in stable condition.   Patient was seen today for sooner appointment due to recent hospitalization for possible TIA.  Patient states since this hospitalization, she has had increase and "cluster symptoms".  She has been experiencing these since her stroke in 07/2017 but they have been increased since 10/28/2017.  She experiences these cluster of symptoms after scanning an area with her head/eyes from side to side and will feel a sensation of nausea, "puffy head" and feeling of being off balance.  She did have these on occasion previously but since 8/1 they have been happening daily and will last throughout the day.  She denies headaches or migraine history but does state she has chronic issues with her sinuses and these issues have been worse over the past 7 to 10 days.  She states she will have left eye pain that feels puffy, pain in her jaw and in the back of her neck.  She feels as though this is all related to her sinuses due to sinus stuffiness and postnasal drip sensation.  She denies her "cluster symptoms" being associated with headache/migraine or vision changes.  Denies increased anxiety prior to the symptoms occurring.  She does state that she is been doing home exercises that were given to her after completing PT for BPPV but she will be restarting outpatient PT on 11/08/2017 to see if this will help with her increase in symptoms.  She also states that since her stroke in 07/2017 she will occasionally feel a "blip" in her vision but only lasts for a few seconds and then resolves.  She is unable to describe her exact symptoms but states it feels like a catching sensation but denies blurry vision or double vision.  She was seen in the ED after this occurring the night prior to due to waking up in the morning with slightly worse vision than the day prior.  Patient is  concerned due to not knowing reasoning behind these continued symptoms and is frustrated that she is unable to control these symptoms.  During appointment, patient is very spastic with rapid speech and heightened anxiety.  It was recommended to increase aspirin 81 mg to 325 mg but recommended for clearance from GI (due to history of cirrhosis) and neurology prior to doing so.  GI did clear for this increase and agree with this plan.  No further testing was done during recent hospitalization besides CT scan which was negative for acute infarct.  Update 01/03/2018.  She returns for follow-up after last visit with [facility] practitioner 2 months ago.  She states she is actually doing better and has not had any further episodes of dizziness or room spinning.  She did benefit significantly by her spitting and vestibular rehabilitation and doing dizziness exercises.  She has had previous episodes of benign positional vertigo in the past and total of 4-5 such episodes so far.  She is denies any significant anxiety.  She has a history of a small  3 mm left MCA bifurcation aneurysm which is being followed conservatively by Dr. Estanislado Pandy.  Her last CT angiogram was on 07/31/2017 which showed treated basilar apex aneurysm without any remnant of recurrence.  3 mm untreated left MCA bifurcation aneurysm which is unchanged from previous study.  She had been told at the last office visit by the nurse practitioner that her symptoms may have been related to underlying anxiety and she did not like this answer and wanted to see me for a second opinion.  11/08/2018 update: Deanna Thompson is a 63 year old female who is being seen today for follow-up.  She has been stable from a neurological standpoint.  She will have occasional vertigo episodes but will subside after doing vestibular exercises which were recommended during vestibular rehab.  She also mentioned infrequent episodes of difficulty with bilateral hand coordination but will  only last for couple seconds.  Symptoms are typically happen during activity such as writing or holding objects.This is been occurring for the past 1 to 2 years and has only occurred maybe twice over the past 6 months.  She does state she will have symptoms occur more frequently over a 3 to 4-day period and then subside.  She denies weakness, numbness/tingling or pain.  Denies headache, increased stress or change of emotion at that time.  She is not interested in pursuing any type of work-up but just wanted to mention in case symptoms worsen.  She does have underlying history of depression/anxiety but has been stable and continues to follow with psychiatry.  She does endorse daytime fatigue despite adequate sleep at night.  She feels as though if she sits for any length of time, it will be easy for her to fall asleep.  She does endorse snoring.  She is on multiple medications and questions whether polypharmacy can be contributing.  She has not previously underwent sleep study.  She continues on aspirin 325 mg without bleeding or bruising.  Continues on atorvastatin 40 mg daily without myalgias.  Blood pressure today 127/77.  No further concerns at this time.     ROS:   14 system review of systems performed and negative with exception of dizziness   PMH:  Past Medical History:  Diagnosis Date   Abdominal hernia    Abscess of pulp of tooth 12/12/14   left side   Anxiety    Arthritis    back  and down flank  right side more than left   B12 deficiency 06/12/2015   Brain aneurysm    METAL COIL PREVENTS MRI IMAGING and CLIP   Cirrhosis ([place])    Presumably alcoholic cirrhosis   Complication of anesthesia    Depression    ETOH abuse    Quit in 07/2011    Fatigue    due to Sjogren's disease   Folate deficiency 06/30/2016   GERD (gastroesophageal reflux disease)    HTN (hypertension)    no longer medicated, dated on 04/25/12   IBS (irritable bowel syndrome)    Iron deficiency anemia  due to chronic blood loss    Myofascial pain    Panic attack    PONV (postoperative nausea and vomiting)    Psoriatic arthritis ([place])    Right inguinal hernia 08/16/2012   Seizures ([place]) 1997   1 seizure from coil insertion but no more seizures and no meds for seizures   Sjogren's disease ([place])    [place] HEAD 02/03/2007   Qualifier: Diagnosis of  By: Aline Brochure MD, Dorothyann Peng  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(HCC)     PSH:  Past Surgical History:  Procedure Laterality Date   AGILE CAPSULE N/A 06/21/2014   Procedure: AGILE CAPSULE;  Surgeon: Danie Binder, MD;  [facility]: [facility];  Service: Endoscopy;  Laterality: N/A;  0700   BIOPSY  10/13/2011   SLF: mild gastritis/duodenal polypoid lesion    CEREBRAL ANEURYSM REPAIR  1997   CHOLECYSTECTOMY N/A 10/28/2018   Procedure: LAPAROSCOPIC CHOLECYSTECTOMY WITH INTRAOPERATIVE CHOLANGIOGRAM;  Surgeon: Virl Cagey, MD;  Location: [facility];  Service: General;  Laterality: N/A;   COLONOSCOPY  10/13/2011   JHE:RDEYCXKG hemorrhoids/varices rectal/lesion in ascending colon(HYPERPLASTIC POLYP)   COLONOSCOPY N/A 01/17/2013   SLF: 1. Internal hemorrhoids 2. Ascending colon polyps 3.  Rectal varices.    COLONOSCOPY N/A 08/10/2016   Dr. Oneida Alar: redundant left colon. external and internal hemorhroids. 5-10 year surveillance   ESOPHAGOGASTRODUODENOSCOPY N/A 06/29/2014   SLF: 1. anemia due to polypoid lesions in teh antrum, and duodenal polyps 2. single duodenum AVM   FLEXIBLE SIGMOIDOSCOPY N/A 01/21/2016   Procedure: FLEXIBLE SIGMOIDOSCOPY;  Surgeon: Danie Binder, MD;  [facility]: [facility];  Service: Endoscopy;  Laterality: N/A;  [place] N/A 06/29/2014   Procedure: GIVENS CAPSULE STUDY;  Surgeon: Danie Binder, MD;  [facility];  Service: Endoscopy;  Laterality: N/A;   GIVENS CAPSULE STUDY N/A 07/13/2014   Procedure: GIVENS CAPSULE STUDY;  Surgeon: Danie Binder, MD;   [facility];  Service: Endoscopy;  Laterality: N/A;  [place] N/A 01/21/2016   Procedure: HEMORRHOID BANDING;  Surgeon: Danie Binder, MD;  [facility];  Service: Endoscopy;  Laterality: N/A;   INGUINAL HERNIA REPAIR Right 08/28/2013   Procedure: RIGHT NGUINAL HERNIORRHAPHY WITH MESH;  Surgeon: Jamesetta So, MD;  Location: [facility];  Service: General;  Laterality: Right;   INSERTION OF MESH N/A 06/09/2013   Procedure: INSERTION OF MESH;  Surgeon: Jamesetta So, MD;  Location: [facility];  Service: General;  Laterality: N/A;   INSERTION OF MESH Right 08/28/2013   Procedure: INSERTION OF MESH;  Surgeon: Jamesetta So, MD;  Location: [facility];  Service: General;  Laterality: Right;   PARACENTESIS  Feb 2014   10.7 liters   TOOTH EXTRACTION Left 11/15/54   UMBILICAL HERNIA REPAIR N/A 06/09/2013   Procedure: UMBILICAL HERNIORRHAPHY WITH MESH;  Surgeon: Jamesetta So, MD;  Location: [facility];  Service: General;  Laterality: N/A;    Social History:  Social History   Socioeconomic History   Marital status: Married    Spouse name: Not on file   Number of children: 0   Years of education: Not on file   Highest education level: Not on file  Occupational History    Employer: RETIRED  Social Needs   Financial resource strain: Not on file   Food insecurity    Worry: Not on file    Inability: Not on file   Transportation needs    Medical: Not on file    Non-medical: Not on file  Tobacco Use   Smoking status: Former Smoker    Packs/day: 2.00    Years: 12.00    Pack years: 24.00    Types: Cigarettes    Quit date: 03/31/1995    Years since quitting: 23.6   Smokeless tobacco: Never Used   Tobacco comment: quit in 1997  Substance and Sexual Activity   Alcohol use: No  Comment: Bottle of wine a day for years, but quit in 07/2011   Drug use: No   Sexual activity: Not Currently    Birth control/protection: Post-menopausal  Lifestyle    Physical activity    Days per week: Not on file    Minutes per session: Not on file   Stress: Not on file  Relationships   Social connections    Talks on phone: Not on file    Gets together: Not on file    Attends religious service: Not on file    Active member of club or organization: Not on file    Attends meetings of clubs or organizations: Not on file    Relationship status: Not on file   Intimate partner violence    Fear of current or ex partner: Not on file    Emotionally abused: Not on file    Physically abused: Not on file    Forced sexual activity: Not on file  Other Topics Concern   Not on file  Social History Narrative   Lives   Caffeine use:    Quit drinking alcohol 3 YEARS ago. No drugs, no IVDU.     Family History:  Family History  Problem Relation Age of Onset   Cirrhosis Mother        nash   COPD Father    Heart disease Father    Asthma Other    Heart disease Other    Colon cancer Neg Hx    Inflammatory bowel disease Neg Hx     Medications:   Current Outpatient Medications on File Prior to Visit  Medication Sig Dispense Refill   aspirin 325 MG tablet Take 1 tablet (325 mg total) by mouth daily.     atorvastatin (LIPITOR) 40 MG tablet TAKE (1) TABLET BY MOUTH ONCE DAILY 30 tablet 5   bumetanide (BUMEX) 1 MG tablet TAKE (1) TABLET BY MOUTH (2) TIMES DAILY. 180 tablet 3   buPROPion (WELLBUTRIN SR) 100 MG 12 hr tablet TAKE (1) TABLET BY MOUTH TWICE DAILY. 60 tablet 5   docusate sodium (COLACE) 100 MG capsule Take 100 mg by mouth daily.      escitalopram (LEXAPRO) 20 MG tablet TAKE (1) TABLET BY MOUTH AT BEDTIME. 30 tablet 5   famotidine (PEPCID) 20 MG tablet 1 PO BID MON-FRI 60 tablet 11   fluticasone (FLONASE) 50 MCG/ACT nasal spray Place 1 spray into both nostrils See admin instructions. Instill 1 spray into each nostril every morning, may also use later in the day as needed for sinus congestion     hydrOXYzine (ATARAX/VISTARIL) 25 MG  tablet Take one tablet by mouth every 6 hrs prn itching 36 tablet 0   Lifitegrast (XIIDRA) 5 % SOLN Place 1 drop into both eyes 2 (two) times daily.     LINZESS 72 MCG capsule TAKE (1) CAPSULE BY MOUTH EVERY DAY BEFORE BREAKFAST. 90 capsule 3   loratadine (CLARITIN) 10 MG tablet Take 10 mg by mouth daily.     losartan (COZAAR) 25 MG tablet TAKE (1) TABLET BY MOUTH ONCE DAILY. 30 tablet 5   meclizine (ANTIVERT) 25 MG tablet Take 1 tablet (25 mg total) by mouth 3 (three) times daily as needed for dizziness. 20 tablet 0   Multiple Vitamin (MULTIVITAMIN WITH MINERALS) TABS tablet Take 1 tablet by mouth daily.     ondansetron (ZOFRAN-ODT) 4 MG disintegrating tablet Take 1 tablet (4 mg total) by mouth every 6 (six) hours as needed for nausea. 20 tablet  0   oxyCODONE (OXY IR/ROXICODONE) 5 MG immediate release tablet Take 1 tablet (5 mg total) by mouth every 4 (four) hours as needed for severe pain or breakthrough pain. 30 tablet 0   Polyvinyl Alcohol-Povidone (REFRESH OP) Place 1 drop into both eyes 3 (three) times daily as needed (dry eyes).     Simethicone (GAS-X PO) Take 1 tablet by mouth 2 (two) times daily as needed (bloating/flatulence).     sodium chloride (OCEAN) 0.65 % nasal spray Place 1 spray into the nose 2 (two) times daily as needed for congestion.      spironolactone (ALDACTONE) 100 MG tablet TAKE (1) TABLET BY MOUTH TWICE DAILY. 180 tablet 0   Tofacitinib Citrate ER (XELJANZ XR) 11 MG TB24 Take 1 tablet by mouth daily. 1/2 tab daily      No current facility-administered medications on file prior to visit.     Allergies:   Allergies  Allergen Reactions   Omeprazole Shortness Of Breath   Orencia [Abatacept] Anaphylaxis, Hives and Rash   Other Shortness Of Breath and Rash    Red Meat Causes shortness of breath, rash and flu like symptoms.    Phenytoin Rash   Keflex [Cephalexin] Other (See Comments)    Severe yeast infection   Phenergan [Promethazine Hcl] Other  (See Comments)    Not in right state of mind, HALLUCINATION     Physical Exam  Vitals:   11/08/18 1031  BP: 127/77  Pulse: 71  Temp: (!) 97.1 F (36.2 C)  TempSrc: Oral  Weight: 241 lb (109.3 kg)  Height: 5\' 8"  (1.727 m)   Body mass index is 36.64 kg/m. No exam data present  General: Obese pleasant middle-age Caucasian female, seated, in no evident distress Head: head normocephalic and atraumatic.   Neck: supple with no carotid or supraclavicular bruits Cardiovascular: regular rate and rhythm, no murmurs Musculoskeletal: no deformity Skin:  no rash/petichiae Vascular:  Normal pulses all extremities  Neurologic Exam Mental Status: Awake and fully alert.  Chronic mild expressive aphasia intermittently.  Oriented to place and time. Recent and remote memory intact.  Attention span, concentration and fund of knowledge appropriate. Mood and affect appropriate.  Cranial Nerves: Pupils equal, briskly reactive to light. Extraocular movements full without nystagmus. Visual fields full to confrontation. Hearing intact. Facial sensation intact. Face, tongue, palate moves normally and symmetrically.  Motor: Normal bulk and tone. Normal strength in all tested extremity muscles. Sensory.: intact to touch , pinprick , position and vibratory sensation.  Coordination: Rapid alternating movements normal in all extremities. Finger-to-nose and heel-to-shin performed accurately bilaterally.  No evidence of tremors or difficulty with hand coordination bilaterally. Gait and Station: Arises from chair without difficulty. Stance is normal. Gait demonstrates normal stride length and balance .  Reflexes: 1+ and symmetric. Toes downgoing.          ASSESSMENT: Deanna Thompson is a 63 y.o. year old female here with possible right brainstem infarct on 07/31/2017. Vascular risk factors include HTN and HLD.  Patient returns today with possible TIA on 10/28/2017 after worsening in vision.  Recurrent episodes  of transient positional vertigo likely benign paroxysmal positional vertigo with improvement of vestibular rehab.  She does endorse occasional vertigo symptoms but will subside after vestibular exercises.  Does have concerns of occasional difficulty with bilateral hand coordination which is been present over the past 1 to 2 years (see HPI).  She also endorses excessive daytime fatigue.   PLAN: -Continue aspirin 325 mg and atorvastatin 40 mg  daily for secondary stroke prevention -Referral placed to [facility] for evaluation of potential underlying sleep disorder.  Discussion regarding increased risk of cardiovascular disease and stroke with untreated sleep apnea (see HPI for additional information).  Discussion regarding daytime fatigue could likely be contributed to polypharmacy but due to underlying medical history, recommend further evaluation by sleep specialist -Advised to notify office with any worsening of BPPV with potential need of repeat vestibular rehab -We will continue to monitor concerns of occasional difficulty with hand coordination.  Recommend obtaining log to provide additional information of symptoms and to notify office with any worsening -Continue to follow with psychiatry for depression/anxiety management -maintain strict control of hypertension with blood pressure goal below 130/90 and lipids with LDL cholesterol goal below 70 mg percent.    Follow-up in 6 months or call earlier if needed   Greater than 50% of time during this 25 minute visit was spent on counseling,explanation of diagnosis of possible brainstem infarct,  BPPV,reviewing risk factor management of HLD and HTN, planning of further management, discussion with patient and family and coordination of care  Antony Contras, MD  [facility] [address] [place], [place] [address]  Phone [phone] Fax [phone]

## 2018-11-09 ENCOUNTER — Inpatient Hospital Stay (HOSPITAL_COMMUNITY): Payer: Commercial Managed Care - PPO | Attending: Hematology

## 2018-11-09 ENCOUNTER — Other Ambulatory Visit: Payer: Self-pay

## 2018-11-09 DIAGNOSIS — R42 Dizziness and giddiness: Secondary | ICD-10-CM | POA: Diagnosis not present

## 2018-11-09 DIAGNOSIS — Z881 Allergy status to other antibiotic agents status: Secondary | ICD-10-CM | POA: Diagnosis not present

## 2018-11-09 DIAGNOSIS — D500 Iron deficiency anemia secondary to blood loss (chronic): Secondary | ICD-10-CM | POA: Diagnosis present

## 2018-11-09 DIAGNOSIS — Z8379 Family history of other diseases of the digestive system: Secondary | ICD-10-CM | POA: Diagnosis not present

## 2018-11-09 DIAGNOSIS — Z79899 Other long term (current) drug therapy: Secondary | ICD-10-CM | POA: Diagnosis not present

## 2018-11-09 DIAGNOSIS — F329 Major depressive disorder, single episode, unspecified: Secondary | ICD-10-CM | POA: Diagnosis not present

## 2018-11-09 DIAGNOSIS — Z87891 Personal history of nicotine dependence: Secondary | ICD-10-CM | POA: Insufficient documentation

## 2018-11-09 DIAGNOSIS — M1990 Unspecified osteoarthritis, unspecified site: Secondary | ICD-10-CM | POA: Diagnosis not present

## 2018-11-09 DIAGNOSIS — Z825 Family history of asthma and other chronic lower respiratory diseases: Secondary | ICD-10-CM | POA: Insufficient documentation

## 2018-11-09 DIAGNOSIS — K7030 Alcoholic cirrhosis of liver without ascites: Secondary | ICD-10-CM | POA: Diagnosis not present

## 2018-11-09 DIAGNOSIS — I10 Essential (primary) hypertension: Secondary | ICD-10-CM | POA: Diagnosis not present

## 2018-11-09 DIAGNOSIS — Z8249 Family history of ischemic heart disease and other diseases of the circulatory system: Secondary | ICD-10-CM | POA: Diagnosis not present

## 2018-11-09 DIAGNOSIS — Z8719 Personal history of other diseases of the digestive system: Secondary | ICD-10-CM | POA: Diagnosis not present

## 2018-11-09 DIAGNOSIS — R197 Diarrhea, unspecified: Secondary | ICD-10-CM | POA: Diagnosis not present

## 2018-11-09 DIAGNOSIS — M3500 Sicca syndrome, unspecified: Secondary | ICD-10-CM | POA: Insufficient documentation

## 2018-11-09 DIAGNOSIS — K31819 Angiodysplasia of stomach and duodenum without bleeding: Secondary | ICD-10-CM | POA: Insufficient documentation

## 2018-11-09 DIAGNOSIS — Z888 Allergy status to other drugs, medicaments and biological substances status: Secondary | ICD-10-CM | POA: Diagnosis not present

## 2018-11-09 DIAGNOSIS — R5383 Other fatigue: Secondary | ICD-10-CM | POA: Diagnosis not present

## 2018-11-09 DIAGNOSIS — F1011 Alcohol abuse, in remission: Secondary | ICD-10-CM | POA: Insufficient documentation

## 2018-11-09 DIAGNOSIS — K922 Gastrointestinal hemorrhage, unspecified: Secondary | ICD-10-CM | POA: Diagnosis present

## 2018-11-09 LAB — CBC WITH DIFFERENTIAL/PLATELET
Abs Immature Granulocytes: 0.06 10*3/uL (ref 0.00–0.07)
Basophils Absolute: 0.1 10*3/uL (ref 0.0–0.1)
Basophils Relative: 1 %
Eosinophils Absolute: 0.3 10*3/uL (ref 0.0–0.5)
Eosinophils Relative: 4 %
HCT: 31.2 % — ABNORMAL LOW (ref 36.0–46.0)
Hemoglobin: 9.4 g/dL — ABNORMAL LOW (ref 12.0–15.0)
Immature Granulocytes: 1 %
Lymphocytes Relative: 13 %
Lymphs Abs: 1.1 10*3/uL (ref 0.7–4.0)
MCH: 27.9 pg (ref 26.0–34.0)
MCHC: 30.1 g/dL (ref 30.0–36.0)
MCV: 92.6 fL (ref 80.0–100.0)
Monocytes Absolute: 0.5 10*3/uL (ref 0.1–1.0)
Monocytes Relative: 6 %
Neutro Abs: 6.5 10*3/uL (ref 1.7–7.7)
Neutrophils Relative %: 75 %
Platelets: 286 10*3/uL (ref 150–400)
RBC: 3.37 MIL/uL — ABNORMAL LOW (ref 3.87–5.11)
RDW: 18.4 % — ABNORMAL HIGH (ref 11.5–15.5)
WBC: 8.7 10*3/uL (ref 4.0–10.5)
nRBC: 0 % (ref 0.0–0.2)

## 2018-11-09 LAB — COMPREHENSIVE METABOLIC PANEL
ALT: 19 U/L (ref 0–44)
AST: 25 U/L (ref 15–41)
Albumin: 3.3 g/dL — ABNORMAL LOW (ref 3.5–5.0)
Alkaline Phosphatase: 92 U/L (ref 38–126)
Anion gap: 12 (ref 5–15)
BUN: 11 mg/dL (ref 8–23)
CO2: 25 mmol/L (ref 22–32)
Calcium: 9.3 mg/dL (ref 8.9–10.3)
Chloride: 103 mmol/L (ref 98–111)
Creatinine, Ser: 1.16 mg/dL — ABNORMAL HIGH (ref 0.44–1.00)
GFR calc Af Amer: 59 mL/min — ABNORMAL LOW (ref >60)
GFR calc non Af Amer: 51 mL/min — ABNORMAL LOW (ref >60)
Glucose, Bld: 120 mg/dL — ABNORMAL HIGH (ref 70–99)
Potassium: 4 mmol/L (ref 3.5–5.1)
Sodium: 140 mmol/L (ref 135–145)
Total Bilirubin: 0.7 mg/dL (ref 0.3–1.2)
Total Protein: 7.7 g/dL (ref 6.5–8.1)

## 2018-11-09 LAB — FOLATE: Folate: 7 ng/mL (ref >5.9)

## 2018-11-09 LAB — IRON AND TIBC
Iron: 30 ug/dL (ref 28–170)
Saturation Ratios: 8 % — ABNORMAL LOW (ref 10.4–31.8)
TIBC: 383 ug/dL (ref 250–450)
UIBC: 353 ug/dL

## 2018-11-09 LAB — FERRITIN: Ferritin: 29 ng/mL (ref 11–307)

## 2018-11-09 LAB — LACTATE DEHYDROGENASE: LDH: 166 U/L (ref 98–192)

## 2018-11-09 LAB — VITAMIN B12: Vitamin B-12: 356 pg/mL (ref 180–914)

## 2018-11-09 NOTE — Telephone Encounter (Signed)
Pt is aware.  

## 2018-11-09 NOTE — Telephone Encounter (Signed)
On recall  °

## 2018-11-10 LAB — VITAMIN D 25 HYDROXY (VIT D DEFICIENCY, FRACTURES): Vit D, 25-Hydroxy: 24.7 ng/mL — ABNORMAL LOW (ref 30.0–100.0)

## 2018-11-12 NOTE — Progress Notes (Signed)
I agree with the above plan 

## 2018-11-16 ENCOUNTER — Inpatient Hospital Stay (HOSPITAL_BASED_OUTPATIENT_CLINIC_OR_DEPARTMENT_OTHER): Payer: Commercial Managed Care - PPO | Admitting: Nurse Practitioner

## 2018-11-16 ENCOUNTER — Other Ambulatory Visit: Payer: Self-pay

## 2018-11-16 DIAGNOSIS — D500 Iron deficiency anemia secondary to blood loss (chronic): Secondary | ICD-10-CM

## 2018-11-16 NOTE — Patient Instructions (Signed)
[place] at [facility] Discharge Instructions We will set up two infusions of IV iron Follow up in 3 months with labs    Thank you for choosing [facility] at [facility] to provide your oncology and hematology care.  To afford each patient quality time with our provider, please arrive at least 15 minutes before your scheduled appointment time.   If you have a lab appointment with the [facility] please come in thru the  Main Entrance and check in at the main information desk  You need to re-schedule your appointment should you arrive 10 or more minutes late.  We strive to give you quality time with our providers, and arriving late affects you and other patients whose appointments are after yours.  Also, if you no show three or more times for appointments you may be dismissed from the clinic at the providers discretion.     Again, thank you for choosing [facility].  Our hope is that these requests will decrease the amount of time that you wait before being seen by our physicians.       _____________________________________________________________  Should you have questions after your visit to [facility], please contact our office at [phone] between the hours of 8:00 a.m. and 4:30 p.m.  Voicemails left after 4:00 p.m. will not be returned until the following business day.  For prescription refill requests, have your pharmacy contact our office and allow 72 hours.    Cancer Center Support Programs:   > Cancer Support Group  2nd Tuesday of the month 1pm-2pm, Journey Room

## 2018-11-16 NOTE — Progress Notes (Signed)
[place], [place] [address]   CLINIC:  Medical Oncology/Hematology  PCP:  Mikey Kirschner, [place] [address] [phone]   REASON FOR VISIT: Follow-up for iron deficiency anemia  CURRENT THERAPY: IV iron    INTERVAL HISTORY:  Deanna Thompson 63 y.o. female returns for routine follow-up for iron deficiency anemia.  She reports she recently had her gallbladder removed and lost about a unit of blood.  She reports she has been feeling dizzy and lightheaded.  She denies any shortness of breath or chest pain.  She denies any bright red bleeding per rectum or melena. Denies any nausea, vomiting, or diarrhea. Denies any new pains. Had not noticed any recent bleeding such as epistaxis, hematuria or hematochezia. Denies recent chest pain on exertion, shortness of breath on minimal exertion, pre-syncopal episodes, or palpitations. Denies any numbness or tingling in hands or feet. Denies any recent fevers, infections, or recent hospitalizations. Patient reports appetite at 75 % and energy level at 25 %.  She is eating well maintaining her weight at this time.    REVIEW OF SYSTEMS:  Review of Systems  Constitutional: Positive for fatigue.  Gastrointestinal: Positive for diarrhea.  Neurological: Positive for dizziness and light-headedness.  All other systems reviewed and are negative.    PAST MEDICAL/SURGICAL HISTORY:  Past Medical History:  Diagnosis Date  . Abdominal hernia   . Abscess of pulp of tooth 12/12/14   left side  . Anxiety   . Arthritis    back  and down flank  right side more than left  . B12 deficiency 06/12/2015  . Brain aneurysm    METAL COIL PREVENTS MRI IMAGING and CLIP  . Cirrhosis ([place])    Presumably alcoholic cirrhosis  . Complication of anesthesia   . Depression   . ETOH abuse    Quit in 07/2011   . Fatigue    due to Sjogren's disease  . Folate deficiency 06/30/2016  . GERD (gastroesophageal  reflux disease)   . HTN (hypertension)    no longer medicated, dated on 04/25/12  . IBS (irritable bowel syndrome)   . Iron deficiency anemia due to chronic blood loss   . Myofascial pain   . Panic attack   . PONV (postoperative nausea and vomiting)   . Psoriatic arthritis ([place])   . Right inguinal hernia 08/16/2012  . Seizures ([place]) 1997   1 seizure from coil insertion but no more seizures and no meds for seizures  . Sjogren's disease ([place])   . [place] HEAD 02/03/2007   Qualifier: Diagnosis of  By: Aline Brochure MD, Dorothyann Peng    . 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([place])    Past Surgical History:  Procedure Laterality Date  . AGILE CAPSULE N/A 06/21/2014   Procedure: AGILE CAPSULE;  Surgeon: Danie Binder, MD;  Location[facility];  Service: Endoscopy;  Laterality: N/A;  0700  . BIOPSY  10/13/2011   SLF: mild gastritis/duodenal polypoid lesion   . CEREBRAL ANEURYSM REPAIR  1997  . CHOLECYSTECTOMY N/A 10/28/2018   Procedure: LAPAROSCOPIC CHOLECYSTECTOMY WITH INTRAOPERATIVE CHOLANGIOGRAM;  Surgeon: Virl Cagey, MD;  Location: [facility] OR[facility];  Service: General;  Laterality: N/A;  . COLONOSCOPY  10/13/2011   HUT:MLYYTKPT hemorrhoids/varices rectal/lesion in ascending colon(HYPERPLASTIC POLYP)  . COLONOSCOPY N/A 01/17/2013   SLF: 1. Internal hemorrhoids 2. Ascending colon polyps 3.  Rectal varices.   . COLONOSCOPY N/A 08/10/2016   Dr. Oneida Alar: redundant left colon. external  and internal hemorhroids. 5-10 year surveillance  . ESOPHAGOGASTRODUODENOSCOPY N/A 06/29/2014   SLF: 1. anemia due to polypoid lesions in teh antrum, and duodenal polyps 2. single duodenum AVM  . FLEXIBLE SIGMOIDOSCOPY N/A 01/21/2016   Procedure: FLEXIBLE SIGMOIDOSCOPY;  Surgeon: Danie Binder, MD;  [facility];  Service: Endoscopy;  Laterality: N/A;  245  . GIVENS CAPSULE STUDY N/A 06/29/2014   Procedure: GIVENS CAPSULE STUDY;  Surgeon: Danie Binder, MD;  [facility];  Service:  Endoscopy;  Laterality: N/A;  . GIVENS CAPSULE STUDY N/A 07/13/2014   Procedure: GIVENS CAPSULE STUDY;  Surgeon: Danie Binder, MD;  [facility];  Service: Endoscopy;  Laterality: N/A;  700  . HEMORRHOID BANDING N/A 01/21/2016   Procedure: HEMORRHOID BANDING;  Surgeon: Danie Binder, MD;  [facility];  Service: Endoscopy;  Laterality: N/A;  . INGUINAL HERNIA REPAIR Right 08/28/2013   Procedure: RIGHT NGUINAL HERNIORRHAPHY WITH MESH;  Surgeon: Jamesetta So, MD;  Location: [facility];  Service: General;  Laterality: Right;  . INSERTION OF MESH N/A 06/09/2013   Procedure: INSERTION OF MESH;  Surgeon: Jamesetta So, MD;  Location: [facility];  Service: General;  Laterality: N/A;  . INSERTION OF MESH Right 08/28/2013   Procedure: INSERTION OF MESH;  Surgeon: Jamesetta So, MD;  Location: [facility];  Service: General;  Laterality: Right;  . PARACENTESIS  Feb 2014   10.7 liters  . TOOTH EXTRACTION Left 12/12/14  . UMBILICAL HERNIA REPAIR N/A 06/09/2013   Procedure: UMBILICAL HERNIORRHAPHY WITH MESH;  Surgeon: Jamesetta So, MD;  Location: [facility];  Service: General;  Laterality: N/A;     SOCIAL HISTORY:  Social History   Socioeconomic History  . Marital status: Married    Spouse name: Not on file  . Number of children: 0  . Years of education: Not on file  . Highest education level: Not on file  Occupational History    Employer: RETIRED  Social Needs  . Financial resource strain: Not on file  . Food insecurity    Worry: Not on file    Inability: Not on file  . Transportation needs    Medical: Not on file    Non-medical: Not on file  Tobacco Use  . Smoking status: Former Smoker    Packs/day: 2.00    Years: 12.00    Pack years: 24.00    Types: Cigarettes    Quit date: 03/31/1995    Years since quitting: 23.6  . Smokeless tobacco: Never Used  . Tobacco comment: quit in 1997  Substance and Sexual Activity  . Alcohol use: No    Comment: Bottle of wine a day for years,  but quit in 07/2011  . Drug use: No  . Sexual activity: Not Currently    Birth control/protection: Post-menopausal  Lifestyle  . Physical activity    Days per week: Not on file    Minutes per session: Not on file  . Stress: Not on file  Relationships  . Social Herbalist on phone: Not on file    Gets together: Not on file    Attends religious service: Not on file    Active member of club or organization: Not on file    Attends meetings of clubs or organizations: Not on file    Relationship status: Not on file  . Intimate partner violence    Fear of current or ex partner: Not on file  Emotionally abused: Not on file    Physically abused: Not on file    Forced sexual activity: Not on file  Other Topics Concern  . Not on file  Social History Narrative   Lives   Caffeine use:    Quit drinking alcohol 3 YEARS ago. No drugs, no IVDU.     FAMILY HISTORY:  Family History  Problem Relation Age of Onset  . Cirrhosis Mother        nash  . COPD Father   . Heart disease Father   . Asthma Other   . Heart disease Other   . Colon cancer Neg Hx   . Inflammatory bowel disease Neg Hx     CURRENT MEDICATIONS:  Outpatient Encounter Medications as of 11/16/2018  Medication Sig  . aspirin 325 MG tablet Take 1 tablet (325 mg total) by mouth daily.  Marland Kitchen atorvastatin (LIPITOR) 40 MG tablet TAKE (1) TABLET BY MOUTH ONCE DAILY  . bumetanide (BUMEX) 1 MG tablet TAKE (1) TABLET BY MOUTH (2) TIMES DAILY.  Marland Kitchen buPROPion (WELLBUTRIN SR) 100 MG 12 hr tablet TAKE (1) TABLET BY MOUTH TWICE DAILY.  Marland Kitchen docusate sodium (COLACE) 100 MG capsule Take 100 mg by mouth as needed.   Marland Kitchen escitalopram (LEXAPRO) 20 MG tablet TAKE (1) TABLET BY MOUTH AT BEDTIME.  . famotidine (PEPCID) 20 MG tablet 1 PO BID MON-FRI  . fluticasone (FLONASE) 50 MCG/ACT nasal spray Place 1 spray into both nostrils See admin instructions. Instill 1 spray into each nostril every morning, may also use later in the day as needed for  sinus congestion  . hydrOXYzine (ATARAX/VISTARIL) 25 MG tablet Take one tablet by mouth every 6 hrs prn itching  . Lifitegrast (XIIDRA) 5 % SOLN Place 1 drop into both eyes 2 (two) times daily.  Marland Kitchen LINZESS 72 MCG capsule TAKE (1) CAPSULE BY MOUTH EVERY DAY BEFORE BREAKFAST.  Marland Kitchen loratadine (CLARITIN) 10 MG tablet Take 10 mg by mouth daily.  Marland Kitchen losartan (COZAAR) 25 MG tablet TAKE (1) TABLET BY MOUTH ONCE DAILY.  . meclizine (ANTIVERT) 25 MG tablet Take 1 tablet (25 mg total) by mouth 3 (three) times daily as needed for dizziness.  . ondansetron (ZOFRAN-ODT) 4 MG disintegrating tablet Take 1 tablet (4 mg total) by mouth every 6 (six) hours as needed for nausea.  Marland Kitchen oxyCODONE (OXY IR/ROXICODONE) 5 MG immediate release tablet Take 1 tablet (5 mg total) by mouth every 4 (four) hours as needed for severe pain or breakthrough pain.  . Polyvinyl Alcohol-Povidone (REFRESH OP) Place 1 drop into both eyes 3 (three) times daily as needed (dry eyes).  . Simethicone (GAS-X PO) Take 1 tablet by mouth 2 (two) times daily as needed (bloating/flatulence).  . sodium chloride (OCEAN) 0.65 % nasal spray Place 1 spray into the nose 2 (two) times daily as needed for congestion.   Marland Kitchen spironolactone (ALDACTONE) 100 MG tablet TAKE (1) TABLET BY MOUTH TWICE DAILY. (Patient taking differently: Take 100 mg by mouth daily. TAKE 1/2 TABLET BY MOUTH TWICE DAILY.)  . Tofacitinib Citrate ER (XELJANZ XR) 11 MG TB24 Take 1 tablet by mouth daily. 1/2 tab daily   . Multiple Vitamin (MULTIVITAMIN WITH MINERALS) TABS tablet Take 1 tablet by mouth daily.   No facility-administered encounter medications on file as of 11/16/2018.     ALLERGIES:  Allergies  Allergen Reactions  . Omeprazole Shortness Of Breath  . Orencia [Abatacept] Anaphylaxis, Hives and Rash  . Other Shortness Of Breath and Rash    Red  Meat Causes shortness of breath, rash and flu like symptoms.   . Phenytoin Rash  . Keflex [Cephalexin] Other (See Comments)    Severe  yeast infection  . Phenergan [Promethazine Hcl] Other (See Comments)    Not in right state of mind, HALLUCINATION     PHYSICAL EXAM:  ECOG Performance status: 1  Vitals:   11/16/18 1321  BP: 90/60  Pulse: 86  Resp: 14  Temp: 97.8 F (36.6 C)  SpO2: 99%   Filed Weights   11/16/18 1321  Weight: 234 lb 11.2 oz (106.5 kg)    Physical Exam Constitutional:      Appearance: Normal appearance. She is normal weight.  Cardiovascular:     Rate and Rhythm: Normal rate and regular rhythm.     Heart sounds: Normal heart sounds.  Pulmonary:     Effort: Pulmonary effort is normal.     Breath sounds: Normal breath sounds.  Abdominal:     General: Bowel sounds are normal.     Palpations: Abdomen is soft.  Musculoskeletal: Normal range of motion.  Skin:    General: Skin is warm and dry.  Neurological:     Mental Status: She is alert and oriented to person, place, and time. Mental status is at baseline.  Psychiatric:        Mood and Affect: Mood normal.        Behavior: Behavior normal.        Thought Content: Thought content normal.        Judgment: Judgment normal.      LABORATORY DATA:  I have reviewed the labs as listed.  CBC    Component Value Date/Time   WBC 8.7 11/09/2018 1119   RBC 3.37 (L) 11/09/2018 1119   HGB 9.4 (L) 11/09/2018 1119   HCT 31.2 (L) 11/09/2018 1119   HCT 41 07/30/2011 0743   PLT 286 11/09/2018 1119   MCV 92.6 11/09/2018 1119   MCV 104.3 07/30/2011 0743   MCH 27.9 11/09/2018 1119   MCHC 30.1 11/09/2018 1119   RDW 18.4 (H) 11/09/2018 1119   LYMPHSABS 1.1 11/09/2018 1119   MONOABS 0.5 11/09/2018 1119   EOSABS 0.3 11/09/2018 1119   BASOSABS 0.1 11/09/2018 1119   CMP Latest Ref Rng & Units 11/09/2018 10/29/2018 10/22/2018  Glucose 70 - 99 mg/dL 120(H) 151(H) 121(H)  BUN 8 - 23 mg/dL 11 12 10   Creatinine 0.44 - 1.00 mg/dL 1.16(H) 1.04(H) 0.88  Sodium 135 - 145 mmol/L 140 136 139  Potassium 3.5 - 5.1 mmol/L 4.0 3.5 3.8  Chloride 98 - 111 mmol/L  103 99 107  CO2 22 - 32 mmol/L 25 26 25   Calcium 8.9 - 10.3 mg/dL 9.3 8.4(L) 8.7(L)  Total Protein 6.5 - 8.1 g/dL 7.7 6.6 6.4(L)  Total Bilirubin 0.3 - 1.2 mg/dL 0.7 1.0 2.6(H)  Alkaline Phos 38 - 126 U/L 92 103 146(H)  AST 15 - 41 U/L 25 52(H) 49(H)  ALT 0 - 44 U/L 19 39 67(H)    I personally performed a face-to-face visit.  All questions were answered to patient's stated satisfaction. Encouraged patient to call with any new concerns or questions before his next visit to the cancer center and we can certain see him sooner, if needed.     ASSESSMENT & PLAN:   Iron deficiency anemia due to chronic blood loss 1.  Iron deficiency anemia: - Due to chronic blood loss secondary to chronic GI blood loss. 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  Also showed polypoid lesions in the duodenum, single duodenum AVM. - Her colonoscopy on 01/17/2013 showed rectal varices, polyps and hemorrhoids. - She had a flex sigmoidoscopy with Dr. Oneida Alar on 01/21/2016 showing rectal bleeding due to proctitis and possibly internal hemorrhoids. -She last had Feraheme on 08/29/2018. -Labs on 11/09/2018 showed her hemoglobin 9.4, ferritin 29, percent saturation 8, platelets 286. -She recently had her gallbladder removed due to a lodged stone and inflammation.  Per the patient she lost about a unit of blood at that time. -Patient is hesitant about receiving iron infusions due to the cost. -I have recommended 2 iron infusions at this time. -Patient was also advised to follow-up with GI due to the severity of her anemia.  - She will follow-up in 3 months with repeat labs.      Orders placed this encounter:  Orders Placed This Encounter  Procedures  . Lactate dehydrogenase  . CBC with Differential/Platelet  . Comprehensive metabolic panel  . Ferritin  . Iron and TIBC  . Vitamin B12  . VITAMIN D 25 Hydroxy (Vit-D Deficiency, Fractures)  . Folate       Francene Finders, FNP-C [place] [phone]

## 2018-11-16 NOTE — Assessment & Plan Note (Addendum)
1.  Iron deficiency anemia: - Due to chronic blood loss secondary to chronic GI blood loss. 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  Also showed polypoid lesions in the duodenum, single duodenum AVM. - Her colonoscopy on 01/17/2013 showed rectal varices, polyps and hemorrhoids. - She had a flex sigmoidoscopy with Dr. Oneida Alar on 01/21/2016 showing rectal bleeding due to proctitis and possibly internal hemorrhoids. -She last had Feraheme on 08/29/2018. -Labs on 11/09/2018 showed her hemoglobin 9.4, ferritin 29, percent saturation 8, platelets 286. -She recently had her gallbladder removed due to a lodged stone and inflammation.  Per the patient she lost about a unit of blood at that time. -Patient is hesitant about receiving iron infusions due to the cost. -I have recommended 2 iron infusions at this time. -Patient was also advised to follow-up with GI due to the severity of her anemia.  - She will follow-up in 3 months with repeat labs.

## 2018-11-17 ENCOUNTER — Ambulatory Visit (HOSPITAL_COMMUNITY): Payer: Commercial Managed Care - PPO

## 2018-11-18 ENCOUNTER — Telehealth (HOSPITAL_COMMUNITY): Payer: Self-pay | Admitting: *Deleted

## 2018-11-18 ENCOUNTER — Encounter: Payer: Self-pay | Admitting: Adult Health

## 2018-11-21 ENCOUNTER — Other Ambulatory Visit: Payer: Self-pay | Admitting: Family Medicine

## 2018-11-23 ENCOUNTER — Other Ambulatory Visit (HOSPITAL_COMMUNITY): Payer: Self-pay | Admitting: Nurse Practitioner

## 2018-11-24 ENCOUNTER — Inpatient Hospital Stay (HOSPITAL_COMMUNITY): Payer: Commercial Managed Care - PPO

## 2018-11-24 ENCOUNTER — Telehealth: Payer: Self-pay | Admitting: Neurology

## 2018-11-24 ENCOUNTER — Encounter (HOSPITAL_COMMUNITY): Payer: Self-pay

## 2018-11-24 ENCOUNTER — Ambulatory Visit: Payer: Commercial Managed Care - PPO | Admitting: Neurology

## 2018-11-24 ENCOUNTER — Other Ambulatory Visit: Payer: Self-pay

## 2018-11-24 ENCOUNTER — Encounter: Payer: Self-pay | Admitting: Neurology

## 2018-11-24 VITALS — BP 119/55 | HR 73 | Temp 97.6°F | Resp 18

## 2018-11-24 DIAGNOSIS — D500 Iron deficiency anemia secondary to blood loss (chronic): Secondary | ICD-10-CM

## 2018-11-24 MED ORDER — SODIUM CHLORIDE 0.9 % IV SOLN
200.0000 mg | Freq: Once | INTRAVENOUS | Status: AC
  Administered 2018-11-24: 14:00:00 200 mg via INTRAVENOUS
  Filled 2018-11-24: qty 10

## 2018-11-24 MED ORDER — SODIUM CHLORIDE 0.9 % IV SOLN
Freq: Once | INTRAVENOUS | Status: AC
  Administered 2018-11-24: 14:00:00 via INTRAVENOUS

## 2018-11-24 NOTE — Progress Notes (Unsigned)
Patient was unable to wait for her appointment. She had another dr's appointment in [place]. She stated she was not upset, just needed to reschedule.

## 2018-11-24 NOTE — Progress Notes (Signed)
[place] Cap[place] tolerated Venofer infusion well without complaints or incident. VSS upon discharge. Pt discharged self ambulatory in satisfactory condition

## 2018-11-24 NOTE — Patient Instructions (Signed)
[facility] at [facility] Discharge Instructions  Received Venofer infusion today. Follow-up as scheduled. Call clinic for any questions or concerns   Thank you for choosing [facility] at [facility] to provide your oncology and hematology care.  To afford each patient quality time with our provider, please arrive at least 15 minutes before your scheduled appointment time.   If you have a lab appointment with the Cancer Center please come in thru the Main Entrance and check in at the main information desk.  You need to re-schedule your appointment should you arrive 10 or more minutes late.  We strive to give you quality time with our providers, and arriving late affects you and other patients whose appointments are after yours.  Also, if you no show three or more times for appointments you may be dismissed from the clinic at the providers discretion.     Again, thank you for choosing [facility].  Our hope is that these requests will decrease the amount of time that you wait before being seen by our physicians.       _____________________________________________________________  Should you have questions after your visit to [facility], please contact our office at [phone] between the hours of 8:00 a.m. and 4:30 p.m.  Voicemails left after 4:00 p.m. will not be returned until the following business day.  For prescription refill requests, have your pharmacy contact our office and allow 72 hours.    Due to Covid, you will need to wear a mask upon entering the hospital. If you do not have a mask, a mask will be given to you at the Main Entrance upon arrival. For doctor visits, patients may have 1 support person with them. For treatment visits, patients can not have anyone with them due to social distancing guidelines and our immunocompromised population.     

## 2018-11-24 NOTE — Telephone Encounter (Signed)
Patient had to leave due to another appointment. Patient states she will  Call back to r/s

## 2018-12-02 ENCOUNTER — Other Ambulatory Visit: Payer: Self-pay

## 2018-12-02 ENCOUNTER — Inpatient Hospital Stay (HOSPITAL_COMMUNITY): Payer: Commercial Managed Care - PPO | Attending: Nurse Practitioner

## 2018-12-02 VITALS — BP 110/64 | HR 78 | Temp 97.8°F | Resp 18

## 2018-12-02 DIAGNOSIS — K219 Gastro-esophageal reflux disease without esophagitis: Secondary | ICD-10-CM | POA: Insufficient documentation

## 2018-12-02 DIAGNOSIS — Z8379 Family history of other diseases of the digestive system: Secondary | ICD-10-CM | POA: Diagnosis not present

## 2018-12-02 DIAGNOSIS — R5383 Other fatigue: Secondary | ICD-10-CM | POA: Insufficient documentation

## 2018-12-02 DIAGNOSIS — I10 Essential (primary) hypertension: Secondary | ICD-10-CM | POA: Insufficient documentation

## 2018-12-02 DIAGNOSIS — N183 Chronic kidney disease, stage 3 (moderate): Secondary | ICD-10-CM | POA: Diagnosis not present

## 2018-12-02 DIAGNOSIS — Z9049 Acquired absence of other specified parts of digestive tract: Secondary | ICD-10-CM | POA: Insufficient documentation

## 2018-12-02 DIAGNOSIS — K922 Gastrointestinal hemorrhage, unspecified: Secondary | ICD-10-CM | POA: Insufficient documentation

## 2018-12-02 DIAGNOSIS — D500 Iron deficiency anemia secondary to blood loss (chronic): Secondary | ICD-10-CM | POA: Insufficient documentation

## 2018-12-02 DIAGNOSIS — Z79899 Other long term (current) drug therapy: Secondary | ICD-10-CM | POA: Diagnosis not present

## 2018-12-02 DIAGNOSIS — Z87891 Personal history of nicotine dependence: Secondary | ICD-10-CM | POA: Insufficient documentation

## 2018-12-02 DIAGNOSIS — Z825 Family history of asthma and other chronic lower respiratory diseases: Secondary | ICD-10-CM | POA: Diagnosis not present

## 2018-12-02 DIAGNOSIS — R42 Dizziness and giddiness: Secondary | ICD-10-CM | POA: Diagnosis not present

## 2018-12-02 DIAGNOSIS — R197 Diarrhea, unspecified: Secondary | ICD-10-CM | POA: Diagnosis not present

## 2018-12-02 DIAGNOSIS — E538 Deficiency of other specified B group vitamins: Secondary | ICD-10-CM | POA: Diagnosis not present

## 2018-12-02 DIAGNOSIS — Z8249 Family history of ischemic heart disease and other diseases of the circulatory system: Secondary | ICD-10-CM | POA: Insufficient documentation

## 2018-12-02 MED ORDER — SODIUM CHLORIDE 0.9 % IV SOLN
200.0000 mg | Freq: Once | INTRAVENOUS | Status: AC
  Administered 2018-12-02: 200 mg via INTRAVENOUS
  Filled 2018-12-02: qty 10

## 2018-12-02 MED ORDER — SODIUM CHLORIDE 0.9 % IV SOLN
Freq: Once | INTRAVENOUS | Status: AC
  Administered 2018-12-02: 12:00:00 via INTRAVENOUS

## 2018-12-02 NOTE — Patient Instructions (Signed)
[place] at [facility]  Discharge Instructions:  You received IV Venofer today.  Iron Sucrose injection What is this medicine? IRON SUCROSE (AHY ern SOO krohs) is an iron complex. Iron is used to make healthy red blood cells, which carry oxygen and nutrients throughout the body. This medicine is used to treat iron deficiency anemia in people with chronic kidney disease. This medicine may be used for other purposes; ask your health care provider or pharmacist if you have questions. COMMON BRAND NAME(S): Venofer What should I tell my health care provider before I take this medicine? They need to know if you have any of these conditions:  anemia not caused by low iron levels  heart disease  high levels of iron in the blood  kidney disease  liver disease  an unusual or allergic reaction to iron, other medicines, foods, dyes, or preservatives  pregnant or trying to get pregnant  breast-feeding How should I use this medicine? This medicine is for infusion into a vein. It is given by a health care professional in a hospital or clinic setting. Talk to your pediatrician regarding the use of this medicine in children. While this drug may be prescribed for children as young as 2 years for selected conditions, precautions do apply. Overdosage: If you think you have taken too much of this medicine contact a poison control center or emergency room at once. NOTE: This medicine is only for you. Do not share this medicine with others. What if I miss a dose? It is important not to miss your dose. Call your doctor or health care professional if you are unable to keep an appointment. What may interact with this medicine? Do not take this medicine with any of the following medications:  deferoxamine  dimercaprol  other iron products This medicine may also interact with the following medications:  chloramphenicol  deferasirox This list may not describe all  possible interactions. Give your health care provider a list of all the medicines, herbs, non-prescription drugs, or dietary supplements you use. Also tell them if you smoke, drink alcohol, or use illegal drugs. Some items may interact with your medicine. What should I watch for while using this medicine? Visit your doctor or healthcare professional regularly. Tell your doctor or healthcare professional if your symptoms do not start to get better or if they get worse. You may need blood work done while you are taking this medicine. You may need to follow a special diet. Talk to your doctor. Foods that contain iron include: whole grains/cereals, dried fruits, beans, or peas, leafy green vegetables, and organ meats (liver, kidney). What side effects may I notice from receiving this medicine? Side effects that you should report to your doctor or health care professional as soon as possible:  allergic reactions like skin rash, itching or hives, swelling of the face, lips, or tongue  breathing problems  changes in blood pressure  cough  fast, irregular heartbeat  feeling faint or lightheaded, falls  fever or chills  flushing, sweating, or hot feelings  joint or muscle aches/pains  seizures  swelling of the ankles or feet  unusually weak or tired Side effects that usually do not require medical attention (report to your doctor or health care professional if they continue or are bothersome):  diarrhea  feeling achy  headache  irritation at site where injected  nausea, vomiting  stomach upset  tiredness This list may not describe all possible side effects. Call your doctor for  medical advice about side effects. You may report side effects to FDA at 1-800-FDA-1088. Where should I keep my medicine? This drug is given in a hospital or clinic and will not be stored at home. NOTE: This sheet is a summary. It may not cover all possible information. If you have questions about this  medicine, talk to your doctor, pharmacist, or health care provider.  2020 Elsevier/Gold Standard (2010-12-25 17:14:35)  _______________________________________________________________  Thank you for choosing [place] at [facility] to provide your oncology and hematology care.  To afford each patient quality time with our providers, please arrive at least 15 minutes before your scheduled appointment.  You need to re-schedule your appointment if you arrive 10 or more minutes late.  We strive to give you quality time with our providers, and arriving late affects you and other patients whose appointments are after yours.  Also, if you no show three or more times for appointments you may be dismissed from the clinic.  Again, thank you for choosing [place] at [facility] hope is that these requests will allow you access to exceptional care and in a timely manner. _______________________________________________________________  If you have questions after your visit, please contact our office at [phone] between the hours of 8:30 a.m. and 5:00 p.m. Voicemails left after 4:30 p.m. will not be returned until the following business day. _______________________________________________________________  For prescription refill requests, have your pharmacy contact our office. _______________________________________________________________  Recommendations made by the consultant and any test results will be sent to your referring physician. _______________________________________________________________

## 2018-12-02 NOTE — Progress Notes (Signed)
Willards presents today for IV iron infusion. Infusion tolerated without incident or complaint. See MAR for details. VSS prior to and post infusion. Discharged in satisfactory condition with follow up instructions.

## 2018-12-09 ENCOUNTER — Other Ambulatory Visit: Payer: Self-pay

## 2018-12-09 ENCOUNTER — Inpatient Hospital Stay (HOSPITAL_COMMUNITY): Payer: Commercial Managed Care - PPO

## 2018-12-09 VITALS — BP 96/62 | HR 67 | Temp 97.5°F | Resp 18

## 2018-12-09 DIAGNOSIS — D500 Iron deficiency anemia secondary to blood loss (chronic): Secondary | ICD-10-CM | POA: Diagnosis not present

## 2018-12-09 MED ORDER — SODIUM CHLORIDE 0.9 % IV SOLN
Freq: Once | INTRAVENOUS | Status: AC
  Administered 2018-12-09: 09:00:00 via INTRAVENOUS

## 2018-12-09 MED ORDER — SODIUM CHLORIDE 0.9% FLUSH
10.0000 mL | Freq: Once | INTRAVENOUS | Status: AC | PRN
  Administered 2018-12-09: 10 mL

## 2018-12-09 MED ORDER — SODIUM CHLORIDE 0.9 % IV SOLN
200.0000 mg | Freq: Once | INTRAVENOUS | Status: AC
  Administered 2018-12-09: 200 mg via INTRAVENOUS
  Filled 2018-12-09: qty 10

## 2018-12-09 NOTE — Progress Notes (Signed)
Patient tolerated iron infusion with no complaints voiced.  Peripheral IV site clean and dry with good blood return noted before and after infusion.  Band aid applied.  VSS with discharge and left ambulatory with no s/s of distress noted.  

## 2018-12-10 ENCOUNTER — Other Ambulatory Visit: Payer: Self-pay | Admitting: Nurse Practitioner

## 2018-12-14 ENCOUNTER — Encounter: Payer: Self-pay | Admitting: Gastroenterology

## 2018-12-14 ENCOUNTER — Ambulatory Visit (INDEPENDENT_AMBULATORY_CARE_PROVIDER_SITE_OTHER): Payer: Commercial Managed Care - PPO | Admitting: Gastroenterology

## 2018-12-14 ENCOUNTER — Other Ambulatory Visit: Payer: Self-pay

## 2018-12-14 DIAGNOSIS — K219 Gastro-esophageal reflux disease without esophagitis: Secondary | ICD-10-CM

## 2018-12-14 DIAGNOSIS — K744 Secondary biliary cirrhosis: Secondary | ICD-10-CM | POA: Diagnosis not present

## 2018-12-14 MED ORDER — VITAMIN D 25 MCG (1000 UNIT) PO TABS: 2000.0000 [IU] | ORAL_TABLET | Freq: Every day | ORAL | 11 refills | Status: DC

## 2018-12-14 MED ORDER — VITAMIN D (ERGOCALCIFEROL) 1.25 MG (50000 UNIT) PO CAPS: 50000.0000 [IU] | ORAL_CAPSULE | ORAL | 0 refills | Status: DC

## 2018-12-14 NOTE — Progress Notes (Signed)
Subjective:    Patient ID: Deanna Thompson, female    DOB: 12-12-1955, 63 y.o.   MRN: [id number]  Mikey Kirschner, MD  HPI Still waiting on DISABILITY. HEARING LIKELY IN JAN 2020. HAS NO SHORT TERM MEMORY. THINKS LIVER IS SCREWING WITH HER IRON. LAST EGD APR 2016. BMs: DIARRHEA  AND WATERY 12-24 HRS RARE ON LINZESS 145 MCG TITRATION. REGULARITY IS DOWN. MAY TAKE IMODIUM TO CONTROL HER BOWEL. HEARTBURN: GAS-X/FAMOTIDINE DAILY OR GETS REALLY BAD REFLUX AND MEDS CONTROL IT.  PT DENIES FEVER, CHILLS, HEMATOCHEZIA, HEMATEMESIS, nausea, vomiting, melena, CHEST PAIN, SHORTNESS OF BREATH, CHANGE IN BOWEL IN HABITS, abdominal pain, problems swallowing, OR problems with sedation.  Past Medical History:  Diagnosis Date   Abdominal hernia    Abscess of pulp of tooth 12/12/14   left side   Anxiety    Arthritis    back  and down flank  right side more than left   B12 deficiency 06/12/2015   Brain aneurysm    METAL COIL PREVENTS MRI IMAGING and CLIP   Cirrhosis ([place])    Presumably alcoholic cirrhosis   Complication of anesthesia    Depression    ETOH abuse    Quit in 07/2011    Fatigue    due to Sjogren's disease   Folate deficiency 06/30/2016   GERD (gastroesophageal reflux disease)    HTN (hypertension)    no longer medicated, dated on 04/25/12   IBS (irritable bowel syndrome)    Iron deficiency anemia due to chronic blood loss    Myofascial pain    Panic attack    PONV (postoperative nausea and vomiting)    Psoriatic arthritis ([place])    Right inguinal hernia 08/16/2012   Seizures ([place]) 1997   1 seizure from coil insertion but no more seizures and no meds for seizures   Sjogren's disease ([place])    [place] HEAD 02/03/2007   Qualifier: Diagnosis of  By: Aline Brochure MD, [place]     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[facility])    Past Surgical History:  Procedure Laterality Date   AGILE CAPSULE N/A 06/21/2014   Procedure: AGILE CAPSULE;  Surgeon:  Danie Binder, MD;  [facility];  Service: Endoscopy;  Laterality: N/A;  0700   BIOPSY  10/13/2011   SLF: mild gastritis/duodenal polypoid lesion    CEREBRAL ANEURYSM REPAIR  1997   CHOLECYSTECTOMY N/A 10/28/2018   Procedure: LAPAROSCOPIC CHOLECYSTECTOMY WITH INTRAOPERATIVE CHOLANGIOGRAM;  Surgeon: Virl Cagey, MD;  Location: [facility] ORS;  Service: General;  Laterality: N/A;   COLONOSCOPY  10/13/2011   [id number] hemorrhoids/varices rectal/lesion in ascending colon(HYPERPLASTIC POLYP)   COLONOSCOPY N/A 01/17/2013   SLF: 1. Internal hemorrhoids 2. Ascending colon polyps 3.  Rectal varices.    COLONOSCOPY N/A 08/10/2016   Dr. Oneida Alar: redundant left colon. external and internal hemorhroids. 5-10 year surveillance   ESOPHAGOGASTRODUODENOSCOPY N/A 06/29/2014   SLF: 1. anemia due to polypoid lesions in teh antrum, and duodenal polyps 2. single duodenum AVM   FLEXIBLE SIGMOIDOSCOPY N/A 01/21/2016   Procedure: FLEXIBLE SIGMOIDOSCOPY;  Surgeon: Danie Binder, MD;  [facility];  Service: Endoscopy;  Laterality: N/A;  [place] N/A 06/29/2014   Procedure: GIVENS CAPSULE STUDY;  Surgeon: Danie Binder, MD;  Location: [facility];  Service: Endoscopy;  Laterality: N/A;   GIVENS CAPSULE STUDY N/A 07/13/2014   Procedure: GIVENS CAPSULE STUDY;  Surgeon: Danie Binder, MD;  Location: [facility];  Service: Endoscopy;  Laterality: N/A;  [place] N/A 01/21/2016   Procedure: HEMORRHOID BANDING;  Surgeon: Danie Binder, MD;  Location: [facility];  Service: Endoscopy;  Laterality: N/A;   INGUINAL HERNIA REPAIR Right 08/28/2013   Procedure: RIGHT NGUINAL HERNIORRHAPHY WITH MESH;  Surgeon: Jamesetta So, MD;  Location: [facility];  Service: General;  Laterality: Right;   INSERTION OF MESH N/A 06/09/2013   Procedure: INSERTION OF MESH;  Surgeon: Jamesetta So, MD;  Location: [facility] OR[facility];  Service: General;  Laterality: N/A;   INSERTION OF MESH  Right 08/28/2013   Procedure: INSERTION OF MESH;  Surgeon: Jamesetta So, MD;  Location: [facility];  Service: General;  Laterality: Right;   PARACENTESIS  Feb 2014   10.7 liters   TOOTH EXTRACTION Left 0000000   UMBILICAL HERNIA REPAIR N/A 06/09/2013   Procedure: UMBILICAL HERNIORRHAPHY WITH MESH;  Surgeon: Jamesetta So, MD;  Location: [facility];  Service: General;  Laterality: N/A;   Allergies  Allergen Reactions   Omeprazole Shortness Of Breath   Orencia [Abatacept] Anaphylaxis, Hives and Rash   Other Shortness Of Breath and Rash    Red Meat Causes shortness of breath, rash and flu like symptoms.    Phenytoin Rash   Keflex [Cephalexin] Other (See Comments)    Severe yeast infection   Phenergan [Promethazine Hcl] Other (See Comments)    Not in right state of mind, HALLUCINATION   Current Outpatient Medications  Medication Sig     aspirin 325 MG tablet Take 1 tablet (325 mg total) by mouth daily.     atorvastatin (LIPITOR) 40 MG tablet TAKE (1) TABLET BY MOUTH ONCE DAILY.     bumetanide (BUMEX) 1 MG tablet TAKE (1) TABLET BY MOUTH TWICE DAILY.     WELLBUTRIN SR 100 MG 12 hr tablet TAKE (1) TABLET BY MOUTH TWICE DAILY.     COLACE 100 MG capsule Take 100 mg by mouth as needed.      escitalopram (LEXAPRO) 20 MG tablet TAKE (1) TABLET BY MOUTH AT BEDTIME.     famotidine (PEPCID) 20 MG tablet 1 PO BID MON-FRI     FLONASE 50 MCG/ACT nasal spray Instill 1 spray into each nostril every morning     hydrOXYzine (ATARAX/VISTARIL) 25 MG tablet Take one tablet by mouth every 6 hrs prn itching     Lifitegrast (XIIDRA) 5 % SOLN Place 1 drop into both eyes 2 (two) times daily.     LINZESS 145 MCG CAPS capsule Take 145 mcg by mouth daily before breakfast.     loratadine (CLARITIN) 10 MG tablet Take 10 mg by mouth daily.     losartan (COZAAR) 25 MG tablet TAKE (1) TABLET BY MOUTH ONCE DAILY.     meclizine (ANTIVERT) 25 MG tablet Take 1 tablet tid as needed for dizziness.      Multiple Vitamin TABS tablet Take 1 tablet by mouth daily.     ondansetron 4 MG disintegrating tablet Take 1 tablet  q6h as needed for nausea.     oxyCODONE 5 MG IR Take 1 tablet q4h PRN for severe pain or breakthrough pain.     Polyvinyl Alcohol-Povidone (REFRESH OP) Place 1 drop into both eyes 3 (three) times daily as needed (dry eyes).     Simethicone (GAS-X PO) Take 1 tablet BID PRN (bloating/flatulence).     sodium chloride (OCEAN) 0.65 % nasal spray Place 1 spray into the nose 2 (two) times daily as needed for congestion.  spironolactone (ALDACTONE) 100 MG tablet TAKE 1/2 TABLET BY MOUTH TWICE DAILY.     XELJANZ XR 11 MG TB24 Take 1 tablet by mouth daily. 1/2 tab daily            Review of Systems PER HPI OTHERWISE ALL SYSTEMS ARE NEGATIVE.    Objective:   Physical Exam Vitals signs reviewed.  Constitutional:      General: She is not in acute distress.    Appearance: She is well-developed.  HENT:     Head: Normocephalic and atraumatic.     Mouth/Throat:     Pharynx: No oropharyngeal exudate.  Eyes:     General: No scleral icterus.    Pupils: Pupils are equal, round, and reactive to light.  Neck:     Musculoskeletal: Normal range of motion and neck supple.  Cardiovascular:     Rate and Rhythm: Normal rate and regular rhythm.     Heart sounds: Normal heart sounds.  Pulmonary:     Effort: Pulmonary effort is normal. No respiratory distress.     Breath sounds: Normal breath sounds.  Abdominal:     General: Bowel sounds are normal. There is no distension.     Palpations: Abdomen is soft.     Tenderness: There is no abdominal tenderness.  Lymphadenopathy:     Cervical: No cervical adenopathy.  Neurological:     Mental Status: She is alert and oriented to person, place, and time.       Assessment & Plan:

## 2018-12-14 NOTE — Patient Instructions (Addendum)
EAT TO LIVE AND THINK OF FOOD AS MEDICINE. 75% OF YOUR PLATE SHOULD BE FRUITS/VEGGIES.  DRINK WATER TO KEEP YOUR URINE LIGHT YELLOW.  To have more energy and to lose weight:      1. CONTINUE YOUR WEIGHT LOSS EFFORTS. I RECOMMEND YOU READ AND FOLLOW RECOMMENDATIONS BY DR. MARK HYMAN, "10-DAY DETOX DIET".    2. If you must eat bread, EAT EZEKIEL BREAD. IT IS IN THE FROZEN SECTION OF THE GROCERY STORE.    3. Do not drink SODA, GATORADE, ENERGY DRINKS, OR DIET SODA.     4. AVOID HIGH FRUCTOSE CORN SYRUP.     5. DO NOT chew SUGAR FREE GUM OR USE ARTIFICIAL SWEETENERS. USE STEVIA AS A SWEETENER.    6. DO NOT EAT ENRICHED WHEAT FLOUR, PASTA, RICE, OR CEREAL.    7. ONLY EAT WILD CAUGHT SEAFOOD, GRASS FED BEEF OR CHICKEN, PORk FROM PASTURE RAISE PIGS, OR EGGS FROM PASTURE RAISED CHICKENS.    8. PRACTICE CHAIR YOGA FOR 15-30 MINS 3 OR 4 TIMES A WEEK AND PROGRESS TO HATHA YOGA OVER NEXT 6 MOS.    9. START TAKING MULTIVITAMIN, VITAMIN B12, AND VITAMIN D3 2000 IU DAILY.    COMPLETE  UPPER ENDOSCOPY.  FOLLOW UP IN 6 MOS.

## 2018-12-14 NOTE — Assessment & Plan Note (Signed)
WELL COMPENSATED DISEASE.  EGD TO SCREEN FOR VARICES

## 2018-12-14 NOTE — Assessment & Plan Note (Signed)
SYMPTOMS CONTROLLED/RESOLVED.  CONTINUE MEDS.

## 2018-12-14 NOTE — Progress Notes (Deleted)
   Subjective:    Patient ID: Deanna Thompson, female    DOB: 11/03/1955, 63 y.o.   MRN: [id number]  HPI    Review of Systems     Objective:   Physical Exam        Assessment & Plan:

## 2018-12-14 NOTE — Addendum Note (Signed)
Addended by: Danie Binder on: 12/14/2018 11:38 AM   Modules accepted: Orders

## 2018-12-15 ENCOUNTER — Telehealth: Payer: Self-pay | Admitting: *Deleted

## 2018-12-15 NOTE — Telephone Encounter (Signed)
LMOVM

## 2018-12-16 ENCOUNTER — Inpatient Hospital Stay (HOSPITAL_COMMUNITY): Payer: Commercial Managed Care - PPO

## 2018-12-16 ENCOUNTER — Other Ambulatory Visit: Payer: Self-pay

## 2018-12-16 ENCOUNTER — Encounter (HOSPITAL_COMMUNITY): Payer: Self-pay

## 2018-12-16 VITALS — BP 109/64 | HR 62 | Temp 97.6°F | Resp 18

## 2018-12-16 DIAGNOSIS — D500 Iron deficiency anemia secondary to blood loss (chronic): Secondary | ICD-10-CM

## 2018-12-16 MED ORDER — SODIUM CHLORIDE 0.9 % IV SOLN
200.0000 mg | Freq: Once | INTRAVENOUS | Status: AC
  Administered 2018-12-16: 200 mg via INTRAVENOUS
  Filled 2018-12-16: qty 10

## 2018-12-16 MED ORDER — SODIUM CHLORIDE 0.9% FLUSH
10.0000 mL | Freq: Once | INTRAVENOUS | Status: AC | PRN
  Administered 2018-12-16: 10 mL

## 2018-12-16 MED ORDER — SODIUM CHLORIDE 0.9 % IV SOLN
Freq: Once | INTRAVENOUS | Status: AC
  Administered 2018-12-16: 10:00:00 via INTRAVENOUS

## 2018-12-16 NOTE — Progress Notes (Signed)
Patient tolerated iron infusion with no complaints voiced.  Peripheral IV site clean and dry with good blood return noted before and after infusion.  Band aid applied.  VSS with discharge and left ambulatory with no s/s of distress noted.  

## 2018-12-16 NOTE — Telephone Encounter (Signed)
LMOVM. Letter mailed to call back

## 2018-12-22 ENCOUNTER — Telehealth: Payer: Self-pay | Admitting: Gastroenterology

## 2018-12-22 ENCOUNTER — Other Ambulatory Visit: Payer: Self-pay

## 2018-12-22 DIAGNOSIS — R188 Other ascites: Secondary | ICD-10-CM

## 2018-12-22 DIAGNOSIS — K219 Gastro-esophageal reflux disease without esophagitis: Secondary | ICD-10-CM

## 2018-12-22 NOTE — Telephone Encounter (Signed)
Pt was calling to schedule her EGD. [phone]

## 2018-12-22 NOTE — Telephone Encounter (Signed)
Called pt, EGD w/Propofol w/SLF scheduled for 03/07/19 at 1:00pm. Orders entered.

## 2018-12-23 ENCOUNTER — Other Ambulatory Visit: Payer: Self-pay

## 2018-12-23 ENCOUNTER — Inpatient Hospital Stay (HOSPITAL_COMMUNITY): Payer: Commercial Managed Care - PPO

## 2018-12-23 VITALS — BP 117/64 | HR 62 | Temp 97.1°F | Resp 18

## 2018-12-23 DIAGNOSIS — D500 Iron deficiency anemia secondary to blood loss (chronic): Secondary | ICD-10-CM

## 2018-12-23 DIAGNOSIS — K7460 Unspecified cirrhosis of liver: Secondary | ICD-10-CM

## 2018-12-23 MED ORDER — SODIUM CHLORIDE 0.9 % IV SOLN
Freq: Once | INTRAVENOUS | Status: AC
  Administered 2018-12-23: 11:00:00 via INTRAVENOUS

## 2018-12-23 MED ORDER — SODIUM CHLORIDE 0.9% FLUSH
10.0000 mL | Freq: Once | INTRAVENOUS | Status: AC | PRN
  Administered 2018-12-23: 10 mL

## 2018-12-23 MED ORDER — SODIUM CHLORIDE 0.9% FLUSH: 10.0000 mL | Freq: Once | INTRAVENOUS | Status: DC

## 2018-12-23 MED ORDER — SODIUM CHLORIDE 0.9 % IV SOLN
200.0000 mg | Freq: Once | INTRAVENOUS | Status: AC
  Administered 2018-12-23: 200 mg via INTRAVENOUS
  Filled 2018-12-23: qty 10

## 2018-12-23 NOTE — Telephone Encounter (Signed)
Pre-op appt 03/06/19 at 9:00am and COVID test at 10:00am. Appt letter mailed with procedure instructions.

## 2018-12-23 NOTE — Progress Notes (Signed)
Patient tolerated iron infusion with no complaints voiced.  Peripheral IV site clean and dry with good blood return noted before and after infusion.  Band aid applied.  VSS with discharge and left ambulatory with no s/s of distress noted.  

## 2018-12-26 NOTE — Telephone Encounter (Signed)
PA for EGD submitted via [facility] website. Case ID# [id number].

## 2018-12-28 NOTE — Telephone Encounter (Signed)
PA not required for EGD per [facility].

## 2019-02-09 ENCOUNTER — Other Ambulatory Visit: Payer: Self-pay

## 2019-02-09 ENCOUNTER — Inpatient Hospital Stay (HOSPITAL_COMMUNITY): Payer: Commercial Managed Care - PPO | Attending: Hematology

## 2019-02-09 DIAGNOSIS — R531 Weakness: Secondary | ICD-10-CM | POA: Insufficient documentation

## 2019-02-09 DIAGNOSIS — M2550 Pain in unspecified joint: Secondary | ICD-10-CM | POA: Insufficient documentation

## 2019-02-09 DIAGNOSIS — F329 Major depressive disorder, single episode, unspecified: Secondary | ICD-10-CM | POA: Diagnosis not present

## 2019-02-09 DIAGNOSIS — Z836 Family history of other diseases of the respiratory system: Secondary | ICD-10-CM | POA: Diagnosis not present

## 2019-02-09 DIAGNOSIS — Z8249 Family history of ischemic heart disease and other diseases of the circulatory system: Secondary | ICD-10-CM | POA: Insufficient documentation

## 2019-02-09 DIAGNOSIS — Z8379 Family history of other diseases of the digestive system: Secondary | ICD-10-CM | POA: Diagnosis not present

## 2019-02-09 DIAGNOSIS — Z8719 Personal history of other diseases of the digestive system: Secondary | ICD-10-CM | POA: Insufficient documentation

## 2019-02-09 DIAGNOSIS — R5383 Other fatigue: Secondary | ICD-10-CM | POA: Diagnosis not present

## 2019-02-09 DIAGNOSIS — Z881 Allergy status to other antibiotic agents status: Secondary | ICD-10-CM | POA: Insufficient documentation

## 2019-02-09 DIAGNOSIS — Z87891 Personal history of nicotine dependence: Secondary | ICD-10-CM | POA: Insufficient documentation

## 2019-02-09 DIAGNOSIS — D500 Iron deficiency anemia secondary to blood loss (chronic): Secondary | ICD-10-CM

## 2019-02-09 DIAGNOSIS — E538 Deficiency of other specified B group vitamins: Secondary | ICD-10-CM | POA: Insufficient documentation

## 2019-02-09 DIAGNOSIS — E669 Obesity, unspecified: Secondary | ICD-10-CM | POA: Diagnosis not present

## 2019-02-09 DIAGNOSIS — Z79899 Other long term (current) drug therapy: Secondary | ICD-10-CM | POA: Diagnosis not present

## 2019-02-09 DIAGNOSIS — I10 Essential (primary) hypertension: Secondary | ICD-10-CM | POA: Diagnosis not present

## 2019-02-09 DIAGNOSIS — Z888 Allergy status to other drugs, medicaments and biological substances status: Secondary | ICD-10-CM | POA: Insufficient documentation

## 2019-02-09 DIAGNOSIS — D509 Iron deficiency anemia, unspecified: Secondary | ICD-10-CM | POA: Diagnosis present

## 2019-02-09 LAB — VITAMIN B12: Vitamin B-12: 227 pg/mL (ref 180–914)

## 2019-02-09 LAB — COMPREHENSIVE METABOLIC PANEL
ALT: 28 U/L (ref 0–44)
AST: 29 U/L (ref 15–41)
Albumin: 4.5 g/dL (ref 3.5–5.0)
Alkaline Phosphatase: 68 U/L (ref 38–126)
Anion gap: 10 (ref 5–15)
BUN: 23 mg/dL (ref 8–23)
CO2: 26 mmol/L (ref 22–32)
Calcium: 9.2 mg/dL (ref 8.9–10.3)
Chloride: 101 mmol/L (ref 98–111)
Creatinine, Ser: 1.1 mg/dL — ABNORMAL HIGH (ref 0.44–1.00)
GFR calc Af Amer: >60 mL/min (ref >60)
GFR calc non Af Amer: 54 mL/min — ABNORMAL LOW (ref >60)
Glucose, Bld: 123 mg/dL — ABNORMAL HIGH (ref 70–99)
Potassium: 3.8 mmol/L (ref 3.5–5.1)
Sodium: 137 mmol/L (ref 135–145)
Total Bilirubin: 0.7 mg/dL (ref 0.3–1.2)
Total Protein: 7.3 g/dL (ref 6.5–8.1)

## 2019-02-09 LAB — CBC WITH DIFFERENTIAL/PLATELET
Abs Immature Granulocytes: 0.02 10*3/uL (ref 0.00–0.07)
Basophils Absolute: 0 10*3/uL (ref 0.0–0.1)
Basophils Relative: 1 %
Eosinophils Absolute: 0.1 10*3/uL (ref 0.0–0.5)
Eosinophils Relative: 2 %
HCT: 38.5 % (ref 36.0–46.0)
Hemoglobin: 12.2 g/dL (ref 12.0–15.0)
Immature Granulocytes: 0 %
Lymphocytes Relative: 22 %
Lymphs Abs: 1.3 10*3/uL (ref 0.7–4.0)
MCH: 29.3 pg (ref 26.0–34.0)
MCHC: 31.7 g/dL (ref 30.0–36.0)
MCV: 92.3 fL (ref 80.0–100.0)
Monocytes Absolute: 0.4 10*3/uL (ref 0.1–1.0)
Monocytes Relative: 6 %
Neutro Abs: 4 10*3/uL (ref 1.7–7.7)
Neutrophils Relative %: 69 %
Platelets: 236 10*3/uL (ref 150–400)
RBC: 4.17 MIL/uL (ref 3.87–5.11)
RDW: 18.6 % — ABNORMAL HIGH (ref 11.5–15.5)
WBC: 5.9 10*3/uL (ref 4.0–10.5)
nRBC: 0 % (ref 0.0–0.2)

## 2019-02-09 LAB — IRON AND TIBC
Iron: 81 ug/dL (ref 28–170)
Saturation Ratios: 16 % (ref 10.4–31.8)
TIBC: 493 ug/dL — ABNORMAL HIGH (ref 250–450)
UIBC: 412 ug/dL

## 2019-02-09 LAB — FERRITIN: Ferritin: 27 ng/mL (ref 11–307)

## 2019-02-09 LAB — VITAMIN D 25 HYDROXY (VIT D DEFICIENCY, FRACTURES): Vit D, 25-Hydroxy: 31.31 ng/mL (ref 30–100)

## 2019-02-09 LAB — LACTATE DEHYDROGENASE: LDH: 166 U/L (ref 98–192)

## 2019-02-09 LAB — FOLATE: Folate: 9.2 ng/mL (ref >5.9)

## 2019-02-13 ENCOUNTER — Other Ambulatory Visit (HOSPITAL_COMMUNITY): Payer: Commercial Managed Care - PPO

## 2019-02-20 ENCOUNTER — Inpatient Hospital Stay (HOSPITAL_BASED_OUTPATIENT_CLINIC_OR_DEPARTMENT_OTHER): Payer: Commercial Managed Care - PPO | Admitting: Hematology

## 2019-02-20 ENCOUNTER — Encounter (HOSPITAL_COMMUNITY): Payer: Self-pay | Admitting: Hematology

## 2019-02-20 ENCOUNTER — Other Ambulatory Visit: Payer: Self-pay

## 2019-02-20 DIAGNOSIS — D500 Iron deficiency anemia secondary to blood loss (chronic): Secondary | ICD-10-CM

## 2019-02-20 DIAGNOSIS — D509 Iron deficiency anemia, unspecified: Secondary | ICD-10-CM | POA: Diagnosis not present

## 2019-02-20 NOTE — Assessment & Plan Note (Signed)
1.  Iron deficiency anemia: - Due to chronic blood loss secondary to chronic GI blood loss. 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  Also showed polypoid lesions in the duodenum, single duodenum AVM. - Her colonoscopy on 01/17/2013 showed rectal varices, polyps and hemorrhoids. - She had a flex sigmoidoscopy with Dr. Oneida Alar on 01/21/2016 showing rectal bleeding due to proctitis and possibly internal hemorrhoids. -She last had Feraheme on 08/29/2018. -Labs on 11/09/2018 showed her hemoglobin 9.4, ferritin 29, percent saturation 8, platelets 286. -She recently had her gallbladder removed due to a lodged stone and inflammation.  Per the patient she lost about a unit of blood at that time. -Patient received IV Venofer on 9/18 and 9/25, tolerated well. -Hemoglobin is stable to improved at 12.2, ferritin within normal at 27, saturation at 16%. -We will continue to monitor her labs.  Patient will return to clinic in 4 months.

## 2019-02-20 NOTE — Progress Notes (Signed)
[address] [place], [place] [id number]   CLINIC:  Medical Oncology/Hematology  PCP:  Mikey Kirschner, [place] [address] [phone]   REASON FOR VISIT:  Follow-up for deficiency anemia  CURRENT THERAPY: Clinical surveillance    INTERVAL HISTORY:  Deanna Thompson 63 y.o. female presents today for follow-up.  She reports overall doing well.  She denies any significant fatigue.  She does report chronic depression that sometimes contributes to her fatigue.  She denies any obvious signs of bleeding.  She denies any change in bowel habits.  Appetite is stable.  No weight loss.  She is here for repeat labs and office visit.   REVIEW OF SYSTEMS:  Review of Systems  Constitutional: Negative.   HENT:  Negative.   Eyes: Negative.   Respiratory: Negative.   Cardiovascular: Negative.   Gastrointestinal: Negative.   Endocrine: Negative.   Genitourinary: Negative.    Musculoskeletal: Positive for arthralgias.  Skin: Negative.   Neurological: Positive for extremity weakness.     PAST MEDICAL/SURGICAL HISTORY:  Past Medical History:  Diagnosis Date  . Abdominal hernia   . Abscess of pulp of tooth 12/12/14   left side  . Anxiety   . Arthritis    back  and down flank  right side more than left  . B12 deficiency 06/12/2015  . Brain aneurysm    METAL COIL PREVENTS MRI IMAGING and CLIP  . Cirrhosis ([place])    Presumably alcoholic cirrhosis  . Complication of anesthesia   . Depression   . ETOH abuse    Quit in 07/2011   . Fatigue    due to Sjogren's disease  . Folate deficiency 06/30/2016  . GERD (gastroesophageal reflux disease)   . HTN (hypertension)    no longer medicated, dated on 04/25/12  . IBS (irritable bowel syndrome)   . Iron deficiency anemia due to chronic blood loss   . Myofascial pain   . Panic attack   . PONV (postoperative nausea and vomiting)   . Psoriatic arthritis ([place])   . Right inguinal hernia  08/16/2012  . Seizures ([place]) 1997   1 seizure from coil insertion but no more seizures and no meds for seizures  . Sjogren's disease ([place])   . [place] HEAD 02/03/2007   Qualifier: Diagnosis of  By: Aline Brochure MD, Dorothyann Peng    . 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([place])    Past Surgical History:  Procedure Laterality Date  . AGILE CAPSULE N/A 06/21/2014   Procedure: AGILE CAPSULE;  Surgeon: Danie Binder, MD;  [facility];  Service: Endoscopy;  Laterality: N/A;  0700  . BIOPSY  10/13/2011   SLF: mild gastritis/duodenal polypoid lesion   . CEREBRAL ANEURYSM REPAIR  1997  . CHOLECYSTECTOMY N/A 10/28/2018   Procedure: LAPAROSCOPIC CHOLECYSTECTOMY WITH INTRAOPERATIVE CHOLANGIOGRAM;  Surgeon: Virl Cagey, MD;  Location: [facility] ORS;  Service: General;  Laterality: N/A;  . COLONOSCOPY  10/13/2011   [id number] hemorrhoids/varices rectal/lesion in ascending colon(HYPERPLASTIC POLYP)  . COLONOSCOPY N/A 01/17/2013   SLF: 1. Internal hemorrhoids 2. Ascending colon polyps 3.  Rectal varices.   . COLONOSCOPY N/A 08/10/2016   Dr. Oneida Alar: redundant left colon. external and internal hemorhroids. 5-10 year surveillance  . ESOPHAGOGASTRODUODENOSCOPY N/A 06/29/2014   SLF: 1. anemia due to polypoid lesions in teh antrum, and duodenal polyps 2. single duodenum AVM  . FLEXIBLE SIGMOIDOSCOPY N/A 01/21/2016   Procedure: FLEXIBLE SIGMOIDOSCOPY;  Surgeon: Danie Binder, MD;  Location: [facility];  Service: Endoscopy;  Laterality: N/A;  245  . GIVENS CAPSULE STUDY N/A 06/29/2014   Procedure: GIVENS CAPSULE STUDY;  Surgeon: Danie Binder, MD;  [facility];  Service: Endoscopy;  Laterality: N/A;  . GIVENS CAPSULE STUDY N/A 07/13/2014   Procedure: GIVENS CAPSULE STUDY;  Surgeon: Danie Binder, MD;  Location: [facility];  Service: Endoscopy;  Laterality: N/A;  700  . HEMORRHOID BANDING N/A 01/21/2016   Procedure: HEMORRHOID BANDING;  Surgeon: Danie Binder, MD;  Location: [facility];  Service: Endoscopy;  Laterality: N/A;  . INGUINAL HERNIA REPAIR Right 08/28/2013   Procedure: RIGHT NGUINAL HERNIORRHAPHY WITH MESH;  Surgeon: Jamesetta So, MD;  Location: [facility] ORS;  Service: General;  Laterality: Right;  . INSERTION OF MESH N/A 06/09/2013   Procedure: INSERTION OF MESH;  Surgeon: Jamesetta So, MD;  Location: [facility] ORS;  Service: General;  Laterality: N/A;  . INSERTION OF MESH Right 08/28/2013   Procedure: INSERTION OF MESH;  Surgeon: Jamesetta So, MD;  Location: [facility] OR[facility];  Service: General;  Laterality: Right;  . PARACENTESIS  Feb 2014   10.7 liters  . TOOTH EXTRACTION Left 12/12/14  . UMBILICAL HERNIA REPAIR N/A 06/09/2013   Procedure: UMBILICAL HERNIORRHAPHY WITH MESH;  Surgeon: Jamesetta So, MD;  Location: [facility] ORS;  Service: General;  Laterality: N/A;     SOCIAL HISTORY:  Social History   Socioeconomic History  . Marital status: Married    Spouse name: Kris Mouton  . Number of children: 0  . Years of education: Not on file  . Highest education level: Not on file  Occupational History    Employer: RETIRED  Social Needs  . Financial resource strain: Not on file  . Food insecurity    Worry: Not on file    Inability: Not on file  . Transportation needs    Medical: Not on file    Non-medical: Not on file  Tobacco Use  . Smoking status: Former Smoker    Packs/day: 2.00    Years: 12.00    Pack years: 24.00    Types: Cigarettes    Quit date: 03/31/1995    Years since quitting: 23.9  . Smokeless tobacco: Never Used  . Tobacco comment: quit in 1997  Substance and Sexual Activity  . Alcohol use: No    Comment: Bottle of wine a day for years, but quit in 07/2011  . Drug use: No  . Sexual activity: Not Currently    Birth control/protection: Post-menopausal  Lifestyle  . Physical activity    Days per week: Not on file    Minutes per session: Not on file  . Stress: Not on file  Relationships  . Social Herbalist on phone: Not on file    Gets  together: Not on file    Attends religious service: Not on file    Active member of club or organization: Not on file    Attends meetings of clubs or organizations: Not on file    Relationship status: Not on file  . Intimate partner violence    Fear of current or ex partner: Not on file    Emotionally abused: Not on file    Physically abused: Not on file    Forced sexual activity: Not on file  Other Topics Concern  . Not on file  Social History Narrative   Lives   Caffeine use:    Quit drinking alcohol  3 YEARS ago. No drugs, no IVDU.     FAMILY HISTORY:  Family History  Problem Relation Age of Onset  . Cirrhosis Mother        nash  . COPD Father   . Heart disease Father   . Asthma Other   . Heart disease Other   . Colon cancer Neg Hx   . Inflammatory bowel disease Neg Hx     CURRENT MEDICATIONS:  Outpatient Encounter Medications as of 02/20/2019  Medication Sig Note  . aspirin 325 MG tablet Take 1 tablet (325 mg total) by mouth daily.   Marland Kitchen atorvastatin (LIPITOR) 40 MG tablet TAKE (1) TABLET BY MOUTH ONCE DAILY.   . bumetanide (BUMEX) 1 MG tablet TAKE (1) TABLET BY MOUTH TWICE DAILY.   Marland Kitchen buPROPion (WELLBUTRIN SR) 100 MG 12 hr tablet TAKE (1) TABLET BY MOUTH TWICE DAILY.   . cholecalciferol (VITAMIN D3) 25 MCG (1000 UT) tablet Take 2 tablets (2,000 Units total) by mouth daily.   Marland Kitchen escitalopram (LEXAPRO) 20 MG tablet TAKE (1) TABLET BY MOUTH AT BEDTIME.   Marland Kitchen Lifitegrast (XIIDRA) 5 % SOLN Place 1 drop into both eyes 2 (two) times daily.   Marland Kitchen loratadine (CLARITIN) 10 MG tablet Take 10 mg by mouth daily.   Marland Kitchen losartan (COZAAR) 25 MG tablet TAKE (1) TABLET BY MOUTH ONCE DAILY.   . Multiple Vitamin (MULTIVITAMIN WITH MINERALS) TABS tablet Take 1 tablet by mouth daily.   Marland Kitchen spironolactone (ALDACTONE) 100 MG tablet TAKE (1) TABLET BY MOUTH TWICE DAILY. (Patient taking differently: Take 100 mg by mouth daily. TAKE 1/2 TABLET BY MOUTH TWICE DAILY.) 11/24/2018: 11/24/18 taking 1/2 tab twice  daily  . Tofacitinib Citrate ER (XELJANZ XR) 11 MG TB24 Take 1 tablet by mouth daily. 1/2 tab daily  11/24/2018: 11/24/18 taking one tab daily  . Vitamin D, Ergocalciferol, (DRISDOL) 1.25 MG (50000 UT) CAPS capsule Take 1 capsule (50,000 Units total) by mouth every 7 (seven) days.   . [DISCONTINUED] linaclotide (LINZESS) 145 MCG CAPS capsule Take 145 mcg by mouth daily before breakfast.   . docusate sodium (COLACE) 100 MG capsule Take 100 mg by mouth as needed.    . famotidine (PEPCID) 20 MG tablet 1 PO BID MON-FRI (Patient not taking: Reported on 02/20/2019)   . fluticasone (FLONASE) 50 MCG/ACT nasal spray Place 1 spray into both nostrils See admin instructions. Instill 1 spray into each nostril every morning, may also use later in the day as needed for sinus congestion   . hydrOXYzine (ATARAX/VISTARIL) 25 MG tablet Take one tablet by mouth every 6 hrs prn itching (Patient not taking: Reported on 02/20/2019)   . meclizine (ANTIVERT) 25 MG tablet Take 1 tablet (25 mg total) by mouth 3 (three) times daily as needed for dizziness. (Patient not taking: Reported on 02/20/2019)   . ondansetron (ZOFRAN-ODT) 4 MG disintegrating tablet Take 1 tablet (4 mg total) by mouth every 6 (six) hours as needed for nausea. (Patient not taking: Reported on 02/20/2019)   . oxyCODONE (OXY IR/ROXICODONE) 5 MG immediate release tablet Take 1 tablet (5 mg total) by mouth every 4 (four) hours as needed for severe pain or breakthrough pain. (Patient not taking: Reported on 02/20/2019)   . Polyvinyl Alcohol-Povidone (REFRESH OP) Place 1 drop into both eyes 3 (three) times daily as needed (dry eyes).   . Simethicone (GAS-X PO) Take 1 tablet by mouth 2 (two) times daily as needed (bloating/flatulence).   . sodium chloride (OCEAN) 0.65 % nasal spray Place  1 spray into the nose 2 (two) times daily as needed for congestion.    . [DISCONTINUED] LINZESS 72 MCG capsule TAKE (1) CAPSULE BY MOUTH EVERY DAY BEFORE BREAKFAST.    No  facility-administered encounter medications on file as of 02/20/2019.     ALLERGIES:  Allergies  Allergen Reactions  . Omeprazole Shortness Of Breath  . Orencia [Abatacept] Anaphylaxis, Hives and Rash  . Other Shortness Of Breath and Rash    Red Meat Causes shortness of breath, rash and flu like symptoms.   . Phenytoin Rash  . Keflex [Cephalexin] Other (See Comments)    Severe yeast infection  . Phenergan [Promethazine Hcl] Other (See Comments)    Not in right state of mind, HALLUCINATION     PHYSICAL EXAM:  ECOG Performance status: 1  Vitals:   02/20/19 1323  BP: 111/61  Pulse: 73  Resp: 18  Temp: (!) 97.3 F (36.3 C)  SpO2: 99%   Filed Weights   02/20/19 1323  Weight: 245 lb 14.4 oz (111.5 kg)    Physical Exam Constitutional:      Appearance: Normal appearance. She is obese.  HENT:     Head: Normocephalic.     Right Ear: External ear normal.     Left Ear: External ear normal.     Nose: Nose normal.  Neck:     Musculoskeletal: Normal range of motion.  Cardiovascular:     Rate and Rhythm: Normal rate and regular rhythm.     Pulses: Normal pulses.     Heart sounds: Normal heart sounds.  Pulmonary:     Effort: Pulmonary effort is normal.     Breath sounds: Normal breath sounds.  Abdominal:     General: Bowel sounds are normal.     Palpations: Abdomen is soft.  Musculoskeletal: Normal range of motion.  Skin:    General: Skin is warm.  Neurological:     General: No focal deficit present.     Mental Status: She is alert and oriented to person, place, and time.      LABORATORY DATA:  I have reviewed the labs as listed.  CBC    Component Value Date/Time   WBC 5.9 02/09/2019 1257   RBC 4.17 02/09/2019 1257   HGB 12.2 02/09/2019 1257   HCT 38.5 02/09/2019 1257   HCT 41 07/30/2011 0743   PLT 236 02/09/2019 1257   MCV 92.3 02/09/2019 1257   MCV 104.3 07/30/2011 0743   MCH 29.3 02/09/2019 1257   MCHC 31.7 02/09/2019 1257   RDW 18.6 (H) 02/09/2019  1257   LYMPHSABS 1.3 02/09/2019 1257   MONOABS 0.4 02/09/2019 1257   EOSABS 0.1 02/09/2019 1257   BASOSABS 0.0 02/09/2019 1257   CMP Latest Ref Rng & Units 02/09/2019 11/09/2018 10/29/2018  Glucose 70 - 99 mg/dL 123(H) 120(H) 151(H)  BUN 8 - 23 mg/dL 23 11 12   Creatinine 0.44 - 1.00 mg/dL 1.10(H) 1.16(H) 1.04(H)  Sodium 135 - 145 mmol/L 137 140 136  Potassium 3.5 - 5.1 mmol/L 3.8 4.0 3.5  Chloride 98 - 111 mmol/L 101 103 99  CO2 22 - 32 mmol/L 26 25 26   Calcium 8.9 - 10.3 mg/dL 9.2 9.3 8.4(L)  Total Protein 6.5 - 8.1 g/dL 7.3 7.7 6.6  Total Bilirubin 0.3 - 1.2 mg/dL 0.7 0.7 1.0  Alkaline Phos 38 - 126 U/L 68 92 103  AST 15 - 41 U/L 29 25 52(H)  ALT 0 - 44 U/L 28 19 39  DIAGNOSTIC IMAGING:  I have independently reviewed the scans and discussed with the patient.   I have reviewed Francene Finders, NP's note and agree with the documentation.  I personally performed a face-to-face visit, made revisions and my assessment and plan is as follows.    ASSESSMENT & PLAN:   No problem-specific Assessment & Plan notes found for this encounter.      Orders placed this encounter:  No orders of the defined types were placed in this encounter.     Derek Jack, MD [place] [phone]

## 2019-03-02 NOTE — Patient Instructions (Signed)
[facility]  03/02/2019     @PREFPERIOPPHARMACY @   Your procedure is scheduled on  03/07/2019 .  Report to [facility] at 1130  A.M.  Call this number if you have problems the morning of surgery:  [phone]   Remember:  Follow the diet instructions given to you by Dr Nona Dell office.                       Take these medicines the morning of surgery with A SIP OF WATER  Wellbutrin, pepcid, hydroxyzine(if needed), xeljanz.    Do not wear jewelry, make-up or nail polish.  Do not wear lotions, powders, or perfumes. Please wear deodorant and brush your teeth.  Do not shave 48 hours prior to surgery.  Men may shave face and neck.  Do not bring valuables to the hospital.  [facility] is not responsible for any belongings or valuables.  Contacts, dentures or bridgework may not be worn into surgery.  Leave your suitcase in the car.  After surgery it may be brought to your room.  For patients admitted to the hospital, discharge time will be determined by your treatment team.  Patients discharged the day of surgery will not be allowed to drive home.   Name and phone number of your driver:   family Special instructions:  None  Please read over the following fact sheets that you were given. Anesthesia Post-op Instructions and Care and Recovery After Surgery       Upper Endoscopy, Adult, Care After This sheet gives you information about how to care for yourself after your procedure. Your health care provider may also give you more specific instructions. If you have problems or questions, contact your health care provider. What can I expect after the procedure? After the procedure, it is common to have:  A sore throat.  Mild stomach pain or discomfort.  Bloating.  Nausea. Follow these instructions at home:   Follow instructions from your health care provider about what to eat or drink after your procedure.  Return to your normal activities as told by your health  care provider. Ask your health care provider what activities are safe for you.  Take over-the-counter and prescription medicines only as told by your health care provider.  Do not drive for 24 hours if you were given a sedative during your procedure.  Keep all follow-up visits as told by your health care provider. This is important. Contact a health care provider if you have:  A sore throat that lasts longer than one day.  Trouble swallowing. Get help right away if:  You vomit blood or your vomit looks like coffee grounds.  You have: ? A fever. ? Bloody, black, or tarry stools. ? A severe sore throat or you cannot swallow. ? Difficulty breathing. ? Severe pain in your chest or abdomen. Summary  After the procedure, it is common to have a sore throat, mild stomach discomfort, bloating, and nausea.  Do not drive for 24 hours if you were given a sedative during the procedure.  Follow instructions from your health care provider about what to eat or drink after your procedure.  Return to your normal activities as told by your health care provider. This information is not intended to replace advice given to you by your health care provider. Make sure you discuss any questions you have with your health care provider. Document Released: 09/15/2011 Document Revised: 09/07/2017 Document Reviewed: 08/16/2017  Elsevier Patient Education  2020 [place] After These instructions provide you with information about caring for yourself after your procedure. Your health care provider may also give you more specific instructions. Your treatment has been planned according to current medical practices, but problems sometimes occur. Call your health care provider if you have any problems or questions after your procedure. What can I expect after the procedure? After your procedure, you may:  Feel sleepy for several hours.  Feel clumsy and have poor balance for  several hours.  Feel forgetful about what happened after the procedure.  Have poor judgment for several hours.  Feel nauseous or vomit.  Have a sore throat if you had a breathing tube during the procedure. Follow these instructions at home: For at least 24 hours after the procedure:      Have a responsible adult stay with you. It is important to have someone help care for you until you are awake and alert.  Rest as needed.  Do not: ? Participate in activities in which you could fall or become injured. ? Drive. ? Use heavy machinery. ? Drink alcohol. ? Take sleeping pills or medicines that cause drowsiness. ? Make important decisions or sign legal documents. ? Take care of children on your own. Eating and drinking  Follow the diet that is recommended by your health care provider.  If you vomit, drink water, juice, or soup when you can drink without vomiting.  Make sure you have little or no nausea before eating solid foods. General instructions  Take over-the-counter and prescription medicines only as told by your health care provider.  If you have sleep apnea, surgery and certain medicines can increase your risk for breathing problems. Follow instructions from your health care provider about wearing your sleep device: ? Anytime you are sleeping, including during daytime naps. ? While taking prescription pain medicines, sleeping medicines, or medicines that make you drowsy.  If you smoke, do not smoke without supervision.  Keep all follow-up visits as told by your health care provider. This is important. Contact a health care provider if:  You keep feeling nauseous or you keep vomiting.  You feel light-headed.  You develop a rash.  You have a fever. Get help right away if:  You have trouble breathing. Summary  For several hours after your procedure, you may feel sleepy and have poor judgment.  Have a responsible adult stay with you for at least 24 hours or  until you are awake and alert. This information is not intended to replace advice given to you by your health care provider. Make sure you discuss any questions you have with your health care provider. Document Released: 07/07/2015 Document Revised: 06/14/2017 Document Reviewed: 07/07/2015 Elsevier Patient Education  2020 [employer].

## 2019-03-06 ENCOUNTER — Encounter (HOSPITAL_COMMUNITY): Payer: Self-pay

## 2019-03-06 ENCOUNTER — Other Ambulatory Visit (HOSPITAL_COMMUNITY)
Admission: RE | Admit: 2019-03-06 | Discharge: 2019-03-06 | Disposition: A | Discharge status: Home / Self Care | Payer: Commercial Managed Care - PPO | Source: Ambulatory Visit | Attending: Gastroenterology | Admitting: Gastroenterology

## 2019-03-06 ENCOUNTER — Encounter (HOSPITAL_COMMUNITY)
Admission: RE | Admit: 2019-03-06 | Discharge: 2019-03-06 | Disposition: A | Discharge status: Home / Self Care | Payer: Commercial Managed Care - PPO | Source: Ambulatory Visit | Attending: Gastroenterology | Admitting: Gastroenterology

## 2019-03-06 ENCOUNTER — Other Ambulatory Visit: Payer: Self-pay

## 2019-03-06 DIAGNOSIS — Z01812 Encounter for preprocedural laboratory examination: Secondary | ICD-10-CM | POA: Insufficient documentation

## 2019-03-06 HISTORY — DX: Dizziness and giddiness: R42

## 2019-03-06 HISTORY — DX: Transient cerebral ischemic attack, unspecified: G45.9

## 2019-03-06 LAB — PROTIME-INR
INR: 1.1 (ref 0.8–1.2)
Prothrombin Time: 13.7 seconds (ref 11.4–15.2)

## 2019-03-06 LAB — SARS CORONAVIRUS 2 (TAT 6-24 HRS): SARS Coronavirus 2: NEGATIVE

## 2019-03-07 ENCOUNTER — Telehealth: Payer: Self-pay

## 2019-03-07 ENCOUNTER — Encounter (HOSPITAL_COMMUNITY): Payer: Self-pay

## 2019-03-07 ENCOUNTER — Ambulatory Visit (HOSPITAL_COMMUNITY): Admit: 2019-03-07 | Payer: Commercial Managed Care - PPO | Admitting: Gastroenterology

## 2019-03-07 ENCOUNTER — Ambulatory Visit (HOSPITAL_COMMUNITY)
Admission: RE | Admit: 2019-03-07 | Discharge: 2019-03-07 | Disposition: A | Discharge status: Home / Self Care | Payer: Commercial Managed Care - PPO | Source: Ambulatory Visit | Attending: Gastroenterology | Admitting: Gastroenterology

## 2019-03-07 ENCOUNTER — Encounter (HOSPITAL_COMMUNITY)
Admission: RE | Disposition: A | Discharge status: Home / Self Care | Payer: Self-pay | Source: Ambulatory Visit | Attending: Gastroenterology

## 2019-03-07 ENCOUNTER — Encounter (HOSPITAL_COMMUNITY): Payer: Self-pay | Admitting: Gastroenterology

## 2019-03-07 ENCOUNTER — Ambulatory Visit (HOSPITAL_COMMUNITY): Payer: Commercial Managed Care - PPO | Admitting: Anesthesiology

## 2019-03-07 DIAGNOSIS — K31811 Angiodysplasia of stomach and duodenum with bleeding: Secondary | ICD-10-CM

## 2019-03-07 DIAGNOSIS — Z87891 Personal history of nicotine dependence: Secondary | ICD-10-CM | POA: Diagnosis not present

## 2019-03-07 DIAGNOSIS — Z13810 Encounter for screening for upper gastrointestinal disorder: Secondary | ICD-10-CM | POA: Diagnosis present

## 2019-03-07 DIAGNOSIS — K7460 Unspecified cirrhosis of liver: Secondary | ICD-10-CM

## 2019-03-07 DIAGNOSIS — F419 Anxiety disorder, unspecified: Secondary | ICD-10-CM | POA: Diagnosis not present

## 2019-03-07 DIAGNOSIS — N183 Chronic kidney disease, stage 3 unspecified: Secondary | ICD-10-CM | POA: Insufficient documentation

## 2019-03-07 DIAGNOSIS — Z7982 Long term (current) use of aspirin: Secondary | ICD-10-CM | POA: Insufficient documentation

## 2019-03-07 DIAGNOSIS — K2100 Gastro-esophageal reflux disease with esophagitis, without bleeding: Secondary | ICD-10-CM | POA: Insufficient documentation

## 2019-03-07 DIAGNOSIS — I8510 Secondary esophageal varices without bleeding: Secondary | ICD-10-CM

## 2019-03-07 DIAGNOSIS — K219 Gastro-esophageal reflux disease without esophagitis: Secondary | ICD-10-CM | POA: Diagnosis not present

## 2019-03-07 DIAGNOSIS — K2960 Other gastritis without bleeding: Secondary | ICD-10-CM | POA: Insufficient documentation

## 2019-03-07 DIAGNOSIS — K31819 Angiodysplasia of stomach and duodenum without bleeding: Secondary | ICD-10-CM | POA: Insufficient documentation

## 2019-03-07 DIAGNOSIS — Z79899 Other long term (current) drug therapy: Secondary | ICD-10-CM | POA: Diagnosis not present

## 2019-03-07 DIAGNOSIS — Z8673 Personal history of transient ischemic attack (TIA), and cerebral infarction without residual deficits: Secondary | ICD-10-CM | POA: Diagnosis not present

## 2019-03-07 DIAGNOSIS — I129 Hypertensive chronic kidney disease with stage 1 through stage 4 chronic kidney disease, or unspecified chronic kidney disease: Secondary | ICD-10-CM | POA: Insufficient documentation

## 2019-03-07 DIAGNOSIS — F329 Major depressive disorder, single episode, unspecified: Secondary | ICD-10-CM | POA: Insufficient documentation

## 2019-03-07 HISTORY — PX: ESOPHAGOGASTRODUODENOSCOPY (EGD) WITH PROPOFOL: SHX5813

## 2019-03-07 SURGERY — ESOPHAGOGASTRODUODENOSCOPY (EGD) WITH PROPOFOL
Anesthesia: General

## 2019-03-07 SURGERY — ESOPHAGOGASTRODUODENOSCOPY (EGD) WITH PROPOFOL
Anesthesia: Monitor Anesthesia Care

## 2019-03-07 MED ORDER — CHLORHEXIDINE GLUCONATE CLOTH 2 % EX PADS: 6.0000 | MEDICATED_PAD | Freq: Once | CUTANEOUS | Status: DC

## 2019-03-07 MED ORDER — PROPOFOL 10 MG/ML IV BOLUS
INTRAVENOUS | Status: DC | PRN
  Administered 2019-03-07: 100 ug via INTRAVENOUS

## 2019-03-07 MED ORDER — GLYCOPYRROLATE 0.2 MG/ML IJ SOLN
INTRAMUSCULAR | Status: DC | PRN
  Administered 2019-03-07: 0.2 mg via INTRAVENOUS

## 2019-03-07 MED ORDER — ONDANSETRON HCL 4 MG/2ML IJ SOLN
INTRAMUSCULAR | Status: DC | PRN
  Administered 2019-03-07: 4 mg via INTRAVENOUS

## 2019-03-07 MED ORDER — PROPOFOL 500 MG/50ML IV EMUL
INTRAVENOUS | Status: DC | PRN
  Administered 2019-03-07: 200 ug/kg/min via INTRAVENOUS

## 2019-03-07 MED ORDER — LIDOCAINE HCL (CARDIAC) PF 100 MG/5ML IV SOSY
PREFILLED_SYRINGE | INTRAVENOUS | Status: DC | PRN
  Administered 2019-03-07: 40 mg via INTRATRACHEAL

## 2019-03-07 MED ORDER — ONDANSETRON HCL 4 MG/2ML IJ SOLN
INTRAMUSCULAR | Status: AC
  Filled 2019-03-07: qty 2

## 2019-03-07 MED ORDER — LIDOCAINE VISCOUS HCL 2 % MT SOLN
OROMUCOSAL | Status: AC
  Filled 2019-03-07: qty 15

## 2019-03-07 MED ORDER — LACTATED RINGERS IV SOLN
INTRAVENOUS | Status: DC | PRN
  Administered 2019-03-07: 12:00:00 via INTRAVENOUS

## 2019-03-07 MED ORDER — GLYCOPYRROLATE PF 0.2 MG/ML IJ SOSY
PREFILLED_SYRINGE | INTRAMUSCULAR | Status: AC
  Filled 2019-03-07: qty 1

## 2019-03-07 MED ORDER — LIDOCAINE VISCOUS HCL 2 % MT SOLN: 5.0000 mL | Freq: Once | OROMUCOSAL | Status: DC

## 2019-03-07 MED ORDER — LACTATED RINGERS IV SOLN
Freq: Once | INTRAVENOUS | Status: AC
  Administered 2019-03-07: 12:00:00 via INTRAVENOUS

## 2019-03-07 NOTE — Discharge Instructions (Signed)
You do not have esophageal varices. 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   DRINK WATER TO KEEP URINE LIGHT YELLOW.  To better control ACID REFLUX AND MANAGE GASTRITIS, INCREASE PEPCID TO TWICE DAILY MON-FRI.  FOLLOW UP IN [facility] 2021.  NEXT ENDOSCOPY IN DEC 2023.        ENDOSCOPY Care After Read the instructions outlined below and refer to this sheet in the next week. These discharge instructions provide you with general information on caring for yourself after you leave the hospital. While your treatment has been planned according to the most current medical practices available, unavoidable complications occasionally occur. If you have any problems or questions after discharge, call DR. Loc Feinstein, [phone].  ACTIVITY  You may resume your regular activity, but move at a slower pace for the next 24 hours.   Take frequent rest periods for the next 24 hours.   Walking will help get rid of the air and reduce the bloated feeling in your belly (abdomen).   No driving for 24 hours (because of the medicine (anesthesia) used during the test).   You may shower.   Do not sign any important legal documents or operate any machinery for 24 hours (because of the anesthesia used during the test).    NUTRITION  Drink plenty of fluids.   You may resume your normal diet as instructed by your doctor.   Begin with a light meal and progress to your normal diet. Heavy or fried foods are harder to digest and may make you feel sick to your stomach (nauseated).   Avoid alcoholic beverages for 24 hours or as instructed.    MEDICATIONS  You may resume your normal medications.   WHAT YOU CAN EXPECT TODAY  Some feelings of bloating in the abdomen.   Passage of more gas than usual.   Spotting of blood in your stool or on the toilet paper  .  IF YOU HAD POLYPS REMOVED  DURING THE COLONOSCOPY:  Eat a soft diet IF YOU HAVE NAUSEA, BLOATING, ABDOMINAL PAIN, OR VOMITING.    FINDING OUT THE RESULTS OF YOUR TEST Not all test results are available during your visit. DR. Oneida Alar WILL CALL YOU WITHIN 7 DAYS OF YOUR PROCEDUE WITH YOUR RESULTS. Do not assume everything is normal if you have not heard from DR. Gaylan Fauver IN ONE WEEK, CALL HER OFFICE AT [phone].  SEEK IMMEDIATE MEDICAL ATTENTION AND CALL THE OFFICE: [phone] IF:  You have more than a spotting of blood in your stool.   Your belly is swollen (abdominal distention).   You are nauseated or vomiting.   You have a temperature over 101F.   You have abdominal pain or discomfort that is severe or gets worse throughout the day.  Arteriovenous Malformation An arteriovenous malformation (AVM) is a disorder that has been present since birth (congenital). It is characterized by a complex, tangled web of arteries and veins. An AVM may occur in the STOMACH, COLON, OR SMALL BOWEL.  SYMPTOMS  The most common problems (symptoms) of AVM include:  Bleeding (hemorrhaging).   ANEMIA  TREATMENT  The treatment for AVMs: **ABLATION WITH HEAT    Gastritis  Gastritis is an inflammation (the body's way of reacting to injury and/or infection) of the stomach. It is often caused by bacterial (germ) infections. It can also be caused BY ASPIRIN, BC/GOODY POWDER'S, (IBUPROFEN) MOTRIN, OR ALEVE (NAPROXEN),  chemicals (including alcohol), SPICY FOODS, and medications. This illness may be associated with generalized malaise (feeling tired, not well), UPPER ABDOMINAL STOMACH cramps, and fever. One common bacterial cause of gastritis is an organism known as H. Pylori. This can be treated with antibiotics.   Lifestyle and home remedies TO CONTROL HEARTBURN/REFLUX  You may eliminate or reduce the frequency of heartburn by making the following lifestyle changes:   Control your weight. Being overweight is a major risk  factor for heartburn and GERD. Excess pounds put pressure on your abdomen, pushing up your stomach and causing acid to back up into your esophagus.    Eat smaller meals. 4 TO 6 MEALS A DAY. This reduces pressure on the lower esophageal sphincter, helping to prevent the valve from opening and acid from washing back into your esophagus.    Loosen your belt. Clothes that fit tightly around your waist put pressure on your abdomen and the lower esophageal sphincter.    Eliminate heartburn triggers. Everyone has specific triggers. Common triggers such as fatty or fried foods, spicy food, tomato sauce, carbonated beverages, alcohol, chocolate, mint, garlic, onion, caffeine and nicotine may make heartburn worse.    Avoid stooping or bending. Tying your shoes is OK. Bending over for longer periods to weed your garden isn't, especially soon after eating.    Don't lie down after a meal. Wait at least three to four hours after eating before going to bed, and don't lie down right after eating.     SLEEP ON A WEDGE OR PUT THE HEAD OF YOUR BED ON 6 INCH BLOCKS TO KEEP YOUR HEAD ABOVE YOUR HEART WHILE YOU SLEEP.  Alternative medicine  Several home remedies exist for treating GERD, but they provide only temporary relief. They include drinking baking soda (sodium bicarbonate) added to water or drinking other fluids such as baking soda mixed with cream of tartar and water.  Although these liquids create temporary relief by neutralizing, washing away or buffering acids, eventually they aggravate the situation by adding gas and fluid to your stomach, increasing pressure and causing more acid reflux. Further, adding more sodium to your diet may increase your blood pressure and add stress to your heart, and excessive bicarbonate ingestion can alter the acid-base balance in your body.

## 2019-03-07 NOTE — Telephone Encounter (Signed)
I have started the PA for the Linzess 72 mcg.

## 2019-03-07 NOTE — Anesthesia Preprocedure Evaluation (Addendum)
Anesthesia Evaluation  Patient identified by MRN, date of birth, ID band Patient awake    Reviewed: Allergy & Precautions, NPO status , Patient's Chart, lab work & pertinent test results, reviewed documented beta blocker date and time   History of Anesthesia Complications (+) PONV and history of anesthetic complications  Airway Mallampati: II  TM Distance: >3 FB Neck ROM: Full    Dental   Crowns :   Pulmonary former smoker,    Pulmonary exam normal breath sounds clear to auscultation       Cardiovascular Exercise Tolerance: Good METS: 3 - Mets hypertension, Pt. 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 TIACVA (occasional trouble swallowing )    GI/Hepatic GERD  Medicated and Controlled,(+) Cirrhosis   ascites  substance abuse  alcohol use,   Endo/Other    Renal/GU Renal InsufficiencyRenal disease     Musculoskeletal  (+) Arthritis , Osteoarthritis,    Abdominal   Peds  Hematology  (+) anemia ,   Anesthesia Other Findings Cirrhosis (HCC) Colon polyps Rash and nonspecific skin eruption Labial swelling Depression Insomnia Iron deficiency anemia due to chronic blood loss Psoriatic arthritis (HCC) B12 deficiency Nutritional anemia Hemorrhoids, internal, with bleeding Constipation Idiopathic proctitis Folate deficiency Essential hypertension GERD  (gastroesophageal reflux disease) Alcohol abuse Brain aneurysm CKD (chronic kidney disease), stage III LBBB (left bundle branch block) Dizziness Stroke (cerebrum) Benign paroxysmal positional vertigo Cholecystitis, acute Gallstone pancreatitis Elevated liver enzymes Non-intractable vomiting Abstinence from alcohol Cirrhosis of liver (HCC) Postoperative anemia due to acute blood loss    Reproductive/Obstetrics                            Anesthesia Physical Anesthesia Plan  ASA: III  Anesthesia Plan: General   Post-op Pain Management:    Induction: Intravenous  PONV Risk Score and Plan: TIVA  Airway Management Planned: Natural Airway, Nasal Cannula and Simple Face Mask  Additional Equipment:   Intra-op Plan:   Post-operative Plan:   Informed Consent: I have reviewed the patients History and Physical, chart, labs and discussed the procedure including the risks, benefits and alternatives for the proposed anesthesia with the patient or authorized representative who has indicated his/her understanding and acceptance.     Dental advisory given  Plan Discussed with: CRNA  Anesthesia Plan Comments:         Anesthesia Quick Evaluation

## 2019-03-07 NOTE — Op Note (Signed)
[facility] Patient Name: [facility] Procedure Date: 03/07/2019 11:45 AM MRN: [id number] Date of Birth: 1955/07/23 Attending MD: Barney Drain MD, MD CSN: [id number] Age: 63 Admit Type: Outpatient Procedure:                Upper GI endoscopy-CONTROL BLEEDING Indications:              1st degree variceal surveillance (known small                            varices, no prior bleeding) Providers:                Barney Drain MD, MD, Janeece Riggers, RN, Randa Spike, Technician Referring MD:             Rosemary Holms, MD Medicines:                Propofol per Anesthesia Complications:            No immediate complications. Estimated Blood Loss:     Estimated blood loss was minimal. Procedure:                Pre-Anesthesia Assessment:                           - Prior to the procedure, a History and Physical                            was performed, and patient medications and                            allergies were reviewed. The patient's tolerance of                            previous anesthesia was also reviewed. The risks                            and benefits of the procedure and the sedation                            options and risks were discussed with the patient.                            All questions were answered, and informed consent                            was obtained. Prior Anticoagulants: The patient has                            taken no previous anticoagulant or antiplatelet                            agents except for aspirin. ASA Grade Assessment: II                            -  A patient with mild systemic disease. After                            reviewing the risks and benefits, the patient was                            deemed in satisfactory condition to undergo the                            procedure. After obtaining informed consent, the                            endoscope was passed under direct vision.                     Throughout the procedure, the patient's blood                            pressure, pulse, and oxygen saturations were                            monitored continuously. The [id number]-[id number]                            scope was introduced through the mouth, and                            advanced to the second part of duodenum. The upper                            GI endoscopy was accomplished without difficulty.                            The patient tolerated the procedure well. Scope In: 12:19:45 PM Scope Out: 12:28:48 PM Total Procedure Duration: 0 hours 9 minutes 3 seconds  Findings:      LA Grade A (one or more mucosal breaks less than 5 mm, not extending       between tops of 2 mucosal folds) esophagitis with no bleeding was found.      A single medium angioectasia with stigmata of recent bleeding was found       in the gastric body and on the lesser curvature of the stomach.       Coagulation for hemostasis using bipolar probe was successful.      Localized moderate inflammation characterized by congestion (edema),       erosions and erythema was found in the gastric antrum.      The examined duodenum was normal. Impression:               - LA Grade A reflux esophagitis with no bleeding.                           - A single recently bleeding angioectasia in the                            stomach. Treated with bipolar cautery.                           -  EROSIVE Gastritis DUE TO ASA. Moderate Sedation:      Per Anesthesia Care Recommendation:           - Patient has a contact number available for                            emergencies. The signs and symptoms of potential                            delayed complications were discussed with the                            patient. Return to normal activities tomorrow.                            Written discharge instructions were provided to the                            patient.                           -  Resume previous diet.                           - Continue present medications. INCREASE PEPCID TO                            BID MON-FRI.                           - Return to GI office in 3 months.                           - Repeat upper endoscopy in 3 years for                            surveillance. Procedure Code(s):        --- Professional ---                           [phone], Esophagogastroduodenoscopy, flexible,                            transoral; with control of bleeding, any method Diagnosis Code(s):        --- Professional ---                           K21.00, Gastro-esophageal reflux disease with                            esophagitis, without bleeding                           K31.811, Angiodysplasia of stomach and duodenum                            with bleeding  K29.70, Gastritis, unspecified, without bleeding                           I85.00, Esophageal varices without bleeding CPT copyright 2019 American Medical Association. All rights reserved. The codes documented in this report are preliminary and upon coder review may  be revised to meet current compliance requirements. Barney Drain, MD Barney Drain MD, MD 03/07/2019 12:46:02 PM This report has been signed electronically. Number of Addenda: 0

## 2019-03-07 NOTE — Anesthesia Postprocedure Evaluation (Signed)
Anesthesia Post Note  Patient: [facility]  Procedure(s) Performed: ESOPHAGOGASTRODUODENOSCOPY (EGD) WITH PROPOFOL (N/A )  Patient location during evaluation: Endoscopy Anesthesia Type: General Level of consciousness: awake and alert Pain management: pain level controlled Vital Signs Assessment: post-procedure vital signs reviewed and stable Respiratory status: spontaneous breathing, nonlabored ventilation, respiratory function stable and patient connected to nasal cannula oxygen Cardiovascular status: blood pressure returned to baseline and stable Postop Assessment: no apparent nausea or vomiting Anesthetic complications: no     Last Vitals:  Vitals:   03/07/19 1240 03/07/19 1245  BP: (!) 89/53 105/64  Pulse: 81 79  Resp:    Temp:    SpO2: 97% 94%    Last Pain:  Vitals:   03/07/19 1237  TempSrc:   PainSc: 0-No pain                 Talitha Givens

## 2019-03-07 NOTE — Transfer of Care (Signed)
Immediate Anesthesia Transfer of Care Note  Patient: [facility]  Procedure(s) Performed: ESOPHAGOGASTRODUODENOSCOPY (EGD) WITH PROPOFOL (N/A )  Patient Location: PACU  Anesthesia Type:General  Level of Consciousness: awake, alert  and oriented  Airway & Oxygen Therapy: Patient Spontanous Breathing  Post-op Assessment: Report given to RN, Post -op Vital signs reviewed and stable and Patient moving all extremities X 4  Post vital signs: Reviewed and stable  Last Vitals:  Vitals Value Taken Time  BP    Temp    Pulse 82 03/07/19 1236  Resp    SpO2 98 % 03/07/19 1236  Vitals shown include unvalidated device data.  Last Pain:  Vitals:   03/07/19 1214  TempSrc:   PainSc: 0-No pain      Patients Stated Pain Goal: 7 ([id number])  Complications: No apparent anesthesia complications

## 2019-03-07 NOTE — H&P (Signed)
Primary Care Physician:  Mikey Kirschner, MD Primary Gastroenterologist:  Dr. Oneida Alar  Pre-Procedure History & Physical: HPI:  Deanna Thompson is a 63 y.o. female here for [place].  Past Medical History:  Diagnosis Date  . Abdominal hernia    repaired  . Abscess of pulp of tooth 12/12/14   left side  . Anxiety   . Arthritis    back  and down flank  right side more than left  . B12 deficiency 06/12/2015  . Brain aneurysm    METAL COIL PREVENTS MRI IMAGING and CLIP  . Cirrhosis ([place])    Presumably alcoholic cirrhosis  . Complication of anesthesia   . Depression   . ETOH abuse    Quit in 07/2011   . Fatigue    due to Sjogren's disease  . Folate deficiency 06/30/2016  . GERD (gastroesophageal reflux disease)   . HTN (hypertension)    no longer medicated, dated on 04/25/12  . IBS (irritable bowel syndrome)   . Iron deficiency anemia due to chronic blood loss   . Myofascial pain   . Panic attack   . PONV (postoperative nausea and vomiting)   . Psoriatic arthritis ([place])   . Right inguinal hernia 08/16/2012  . Seizures ([place]) 1997   1 seizure from coil insertion but no more seizures and no meds for seizures  . Sjogren's disease ([place])   . [place] HEAD 02/03/2007   Qualifier: Diagnosis of  By: Aline Brochure MD, Dorothyann Peng    . TIA (transient ischemic attack)   . 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([place])   . Vertigo     Past Surgical History:  Procedure Laterality Date  . AGILE CAPSULE N/A 06/21/2014   Procedure: AGILE CAPSULE;  Surgeon: Danie Binder, MD;  [facility];  Service: Endoscopy;  Laterality: N/A;  0700  . BIOPSY  10/13/2011   SLF: mild gastritis/duodenal polypoid lesion   . CEREBRAL ANEURYSM REPAIR  1997  . CHOLECYSTECTOMY N/A 10/28/2018   Procedure: LAPAROSCOPIC CHOLECYSTECTOMY WITH INTRAOPERATIVE CHOLANGIOGRAM;  Surgeon: Virl Cagey, MD;  Location: [facility];  Service: General;  Laterality: N/A;  . COLONOSCOPY  10/13/2011    [id number] hemorrhoids/varices rectal/lesion in ascending colon(HYPERPLASTIC POLYP)  . COLONOSCOPY N/A 01/17/2013   SLF: 1. Internal hemorrhoids 2. Ascending colon polyps 3.  Rectal varices.   . COLONOSCOPY N/A 08/10/2016   Dr. Oneida Alar: redundant left colon. external and internal hemorhroids. 5-10 year surveillance  . ESOPHAGOGASTRODUODENOSCOPY N/A 06/29/2014   SLF: 1. anemia due to polypoid lesions in teh antrum, and duodenal polyps 2. single duodenum AVM  . FLEXIBLE SIGMOIDOSCOPY N/A 01/21/2016   Procedure: FLEXIBLE SIGMOIDOSCOPY;  Surgeon: Danie Binder, MD;  [facility];  Service: Endoscopy;  Laterality: N/A;  245  . GIVENS CAPSULE STUDY N/A 06/29/2014   Procedure: GIVENS CAPSULE STUDY;  Surgeon: Danie Binder, MD;  [facility];  Service: Endoscopy;  Laterality: N/A;  . GIVENS CAPSULE STUDY N/A 07/13/2014   Procedure: GIVENS CAPSULE STUDY;  Surgeon: Danie Binder, MD;  [facility];  Service: Endoscopy;  Laterality: N/A;  700  . HEMORRHOID BANDING N/A 01/21/2016   Procedure: HEMORRHOID BANDING;  Surgeon: Danie Binder, MD;  [facility];  Service: Endoscopy;  Laterality: N/A;  . INGUINAL HERNIA REPAIR Right 08/28/2013   Procedure: RIGHT NGUINAL HERNIORRHAPHY WITH MESH;  Surgeon: Jamesetta So, MD;  Location: [facility];  Service: General;  Laterality: Right;  . INSERTION OF MESH N/A  06/09/2013   Procedure: INSERTION OF MESH;  Surgeon: Jamesetta So, MD;  Location: [facility];  Service: General;  Laterality: N/A;  . INSERTION OF MESH Right 08/28/2013   Procedure: INSERTION OF MESH;  Surgeon: Jamesetta So, MD;  Location: [facility];  Service: General;  Laterality: Right;  . PARACENTESIS  Feb 2014   10.7 liters  . TOOTH EXTRACTION Left 12/12/14  . UMBILICAL HERNIA REPAIR N/A 06/09/2013   Procedure: UMBILICAL HERNIORRHAPHY WITH MESH;  Surgeon: Jamesetta So, MD;  Location: [facility];  Service: General;  Laterality: N/A;    Prior to Admission medications    Medication Sig Start Date End Date Taking? Authorizing Provider  aspirin 325 MG tablet Take 1 tablet (325 mg total) by mouth daily. 10/31/18  Yes Virl Cagey, MD  atorvastatin (LIPITOR) 40 MG tablet TAKE (1) TABLET BY MOUTH ONCE DAILY. Patient taking differently: Take 40 mg by mouth daily.  11/21/18  Yes Mikey Kirschner, MD  bumetanide (BUMEX) 1 MG tablet TAKE (1) TABLET BY MOUTH TWICE DAILY. Patient taking differently: Take 1 mg by mouth 2 (two) times daily.  12/13/18  Yes Carlis Stable, NP  buPROPion (WELLBUTRIN SR) 100 MG 12 hr tablet TAKE (1) TABLET BY MOUTH TWICE DAILY. Patient taking differently: Take 100 mg by mouth 2 (two) times daily.  10/17/18  Yes Mikey Kirschner, MD  cholecalciferol (VITAMIN D3) 25 MCG (1000 UT) tablet Take 2 tablets (2,000 Units total) by mouth daily. Patient taking differently: Take 1,000 Units by mouth every evening.  12/14/18  Yes Aleta Manternach L, MD  docusate sodium (COLACE) 100 MG capsule Take 100-200 mg by mouth 2 (two) times daily as needed (constipation.).    Yes [provider]  escitalopram (LEXAPRO) 20 MG tablet TAKE (1) TABLET BY MOUTH AT BEDTIME. Patient taking differently: Take 20 mg by mouth at bedtime.  10/17/18  Yes Mikey Kirschner, MD  famotidine (PEPCID) 20 MG tablet Take 20 mg by mouth daily.   Yes [provider]  fluticasone (FLONASE) 50 MCG/ACT nasal spray Place 1 spray into both nostrils See admin instructions. Instill 1 spray into each nostril every morning, may also use later in the day as needed for sinus congestion   Yes [provider]  Lifitegrast (XIIDRA) 5 % SOLN Place 1 drop into both eyes 2 (two) times daily.   Yes [provider]  linaclotide (LINZESS) 145 MCG CAPS capsule Take 145 mcg by mouth daily before breakfast.   Yes [provider]  loratadine (CLARITIN) 10 MG tablet Take 10 mg by mouth daily.   Yes [provider]  losartan (COZAAR) 25 MG tablet TAKE (1) TABLET BY  MOUTH ONCE DAILY. Patient taking differently: Take 25 mg by mouth daily.  10/17/18  Yes Mikey Kirschner, MD  Multiple Vitamin (MULTIVITAMIN WITH MINERALS) TABS tablet Take 1 tablet by mouth daily.   Yes [provider]  Polyvinyl Alcohol-Povidone (REFRESH OP) Place 1 drop into both eyes 3 (three) times daily as needed (dry eyes).   Yes [provider]  Simethicone (GAS-X PO) Take 1 tablet by mouth 2 (two) times daily as needed (bloating/flatulence).   Yes [provider]  sodium chloride (OCEAN) 0.65 % nasal spray Place 1 spray into the nose 2 (two) times daily as needed for congestion.    Yes [provider]  spironolactone (ALDACTONE) 100 MG tablet TAKE (1) TABLET BY MOUTH TWICE DAILY. Patient taking differently: Take 50 mg by mouth 2 (  two) times daily.  10/18/18  Yes Mahala Menghini, PA-C  Tofacitinib Citrate ER (XELJANZ XR) 11 MG TB24 Take 11 mg by mouth daily.    Yes [provider]  Camphor-Menthol-Methyl Sal (SALONPAS) 3.04-04-08 % PTCH Place 1 patch onto the skin daily as needed (pain.).    [provider]  hydrOXYzine (ATARAX/VISTARIL) 25 MG tablet Take one tablet by mouth every 6 hrs prn itching 10/18/18   Mikey Kirschner, MD  ondansetron (ZOFRAN-ODT) 4 MG disintegrating tablet Take 1 tablet (4 mg total) by mouth every 6 (six) hours as needed for nausea. Patient not taking: Reported on 02/20/2019 10/29/18   Virl Cagey, MD  oxyCODONE (OXY IR/ROXICODONE) 5 MG immediate release tablet Take 1 tablet (5 mg total) by mouth every 4 (four) hours as needed for severe pain or breakthrough pain. Patient not taking: Reported on 02/20/2019 10/29/18   Virl Cagey, MD  Vitamin D, Ergocalciferol, (DRISDOL) 1.25 MG (50000 UT) CAPS capsule Take 1 capsule (50,000 Units total) by mouth every 7 (seven) days. Patient not taking: Reported on 02/24/2019 12/14/18   Danie Binder, MD    Allergies as of 12/23/2018 - Review Complete 12/16/2018   Allergen Reaction Noted  . Omeprazole Shortness Of Breath 08/19/2011  . Orencia [abatacept] Anaphylaxis, Hives, and Rash 10/25/2017  . Other Shortness Of Breath and Rash 01/09/2013  . Phenytoin Rash   . Keflex [cephalexin] Other (See Comments)   . Phenergan [promethazine hcl] Other (See Comments) 04/08/2012    Family History  Problem Relation Age of Onset  . Cirrhosis Mother        nash  . COPD Father   . Heart disease Father   . Asthma Other   . Heart disease Other   . Colon cancer Neg Hx   . Inflammatory bowel disease Neg Hx     Social History   Socioeconomic History  . Marital status: Married    Spouse name: Kris Mouton  . Number of children: 0  . Years of education: Not on file  . Highest education level: Not on file  Occupational History    Employer: RETIRED  Social Needs  . Financial resource strain: Not on file  . Food insecurity    Worry: Not on file    Inability: Not on file  . Transportation needs    Medical: Not on file    Non-medical: Not on file  Tobacco Use  . Smoking status: Former Smoker    Packs/day: 2.00    Years: 12.00    Pack years: 24.00    Types: Cigarettes    Quit date: 03/31/1995    Years since quitting: 23.9  . Smokeless tobacco: Never Used  . Tobacco comment: quit in 1997  Substance and Sexual Activity  . Alcohol use: No    Comment: Bottle of wine a day for years, but quit in 07/2011  . Drug use: No  . Sexual activity: Not Currently    Birth control/protection: Post-menopausal  Lifestyle  . Physical activity    Days per week: Not on file    Minutes per session: Not on file  . Stress: Not on file  Relationships  . Social Herbalist on phone: Not on file    Gets together: Not on file    Attends religious service: Not on file    Active member of club or organization: Not on file    Attends meetings of clubs or organizations: Not on file  Relationship status: Not on file  . Intimate partner violence    Fear of current  or ex partner: Not on file    Emotionally abused: Not on file    Physically abused: Not on file    Forced sexual activity: Not on file  Other Topics Concern  . Not on file  Social History Narrative   Lives   Caffeine use:    Quit drinking alcohol 3 YEARS ago. No drugs, no IVDU.     Review of Systems: See HPI, otherwise negative ROS   Physical Exam: BP 122/63   Pulse 68   Temp 98.3 F (36.8 C) (Oral)   Resp 16   SpO2 97%  General:   Alert,  pleasant and cooperative in NAD Head:  Normocephalic and atraumatic. Neck:  Supple; Lungs:  Clear throughout to auscultation.    Heart:  Regular rate and rhythm. Abdomen:  Soft, nontender and nondistended. Normal bowel sounds, without guarding, and without rebound.   Neurologic:  Alert and  oriented x4;  grossly normal neurologically.  Impression/Plan:     SCREENING for VARICES  PLAN:  1.EGD TODAY.  DISCUSSED PROCEDURE, BENEFITS, & RISKS: < 1% chance of medication reaction, bleeding, perforation, or ASPIRATION.

## 2019-03-14 ENCOUNTER — Telehealth: Payer: Self-pay | Admitting: Gastroenterology

## 2019-03-14 ENCOUNTER — Inpatient Hospital Stay (HOSPITAL_COMMUNITY)
Admission: EM | Admit: 2019-03-14 | Discharge: 2019-03-17 | DRG: 378 | Disposition: A | Discharge status: Home / Self Care | Payer: Commercial Managed Care - PPO | Attending: Family Medicine | Admitting: Family Medicine

## 2019-03-14 ENCOUNTER — Encounter (HOSPITAL_COMMUNITY): Payer: Self-pay | Admitting: *Deleted

## 2019-03-14 ENCOUNTER — Other Ambulatory Visit: Payer: Self-pay

## 2019-03-14 DIAGNOSIS — K922 Gastrointestinal hemorrhage, unspecified: Secondary | ICD-10-CM | POA: Diagnosis present

## 2019-03-14 DIAGNOSIS — Z8679 Personal history of other diseases of the circulatory system: Secondary | ICD-10-CM

## 2019-03-14 DIAGNOSIS — K31811 Angiodysplasia of stomach and duodenum with bleeding: Secondary | ICD-10-CM | POA: Diagnosis not present

## 2019-03-14 DIAGNOSIS — I429 Cardiomyopathy, unspecified: Secondary | ICD-10-CM | POA: Diagnosis present

## 2019-03-14 DIAGNOSIS — F419 Anxiety disorder, unspecified: Secondary | ICD-10-CM | POA: Diagnosis present

## 2019-03-14 DIAGNOSIS — M3500 Sicca syndrome, unspecified: Secondary | ICD-10-CM | POA: Diagnosis present

## 2019-03-14 DIAGNOSIS — I080 Rheumatic disorders of both mitral and aortic valves: Secondary | ICD-10-CM | POA: Diagnosis present

## 2019-03-14 DIAGNOSIS — Z9049 Acquired absence of other specified parts of digestive tract: Secondary | ICD-10-CM

## 2019-03-14 DIAGNOSIS — M1990 Unspecified osteoarthritis, unspecified site: Secondary | ICD-10-CM | POA: Diagnosis present

## 2019-03-14 DIAGNOSIS — E538 Deficiency of other specified B group vitamins: Secondary | ICD-10-CM | POA: Diagnosis present

## 2019-03-14 DIAGNOSIS — K7030 Alcoholic cirrhosis of liver without ascites: Secondary | ICD-10-CM | POA: Diagnosis not present

## 2019-03-14 DIAGNOSIS — L4050 Arthropathic psoriasis, unspecified: Secondary | ICD-10-CM | POA: Diagnosis not present

## 2019-03-14 DIAGNOSIS — Z8673 Personal history of transient ischemic attack (TIA), and cerebral infarction without residual deficits: Secondary | ICD-10-CM

## 2019-03-14 DIAGNOSIS — K7460 Unspecified cirrhosis of liver: Secondary | ICD-10-CM | POA: Diagnosis present

## 2019-03-14 DIAGNOSIS — I5020 Unspecified systolic (congestive) heart failure: Secondary | ICD-10-CM | POA: Diagnosis present

## 2019-03-14 DIAGNOSIS — D62 Acute posthemorrhagic anemia: Secondary | ICD-10-CM | POA: Diagnosis present

## 2019-03-14 DIAGNOSIS — G40909 Epilepsy, unspecified, not intractable, without status epilepticus: Secondary | ICD-10-CM | POA: Diagnosis present

## 2019-03-14 DIAGNOSIS — Z8249 Family history of ischemic heart disease and other diseases of the circulatory system: Secondary | ICD-10-CM

## 2019-03-14 DIAGNOSIS — Z79899 Other long term (current) drug therapy: Secondary | ICD-10-CM

## 2019-03-14 DIAGNOSIS — Z91018 Allergy to other foods: Secondary | ICD-10-CM

## 2019-03-14 DIAGNOSIS — K219 Gastro-esophageal reflux disease without esophagitis: Secondary | ICD-10-CM | POA: Diagnosis present

## 2019-03-14 DIAGNOSIS — F410 Panic disorder [episodic paroxysmal anxiety] without agoraphobia: Secondary | ICD-10-CM | POA: Diagnosis present

## 2019-03-14 DIAGNOSIS — Z20828 Contact with and (suspected) exposure to other viral communicable diseases: Secondary | ICD-10-CM | POA: Diagnosis present

## 2019-03-14 DIAGNOSIS — F329 Major depressive disorder, single episode, unspecified: Secondary | ICD-10-CM | POA: Diagnosis present

## 2019-03-14 DIAGNOSIS — D509 Iron deficiency anemia, unspecified: Secondary | ICD-10-CM | POA: Diagnosis present

## 2019-03-14 DIAGNOSIS — Z888 Allergy status to other drugs, medicaments and biological substances status: Secondary | ICD-10-CM

## 2019-03-14 DIAGNOSIS — K625 Hemorrhage of anus and rectum: Secondary | ICD-10-CM | POA: Diagnosis not present

## 2019-03-14 DIAGNOSIS — D649 Anemia, unspecified: Secondary | ICD-10-CM | POA: Diagnosis present

## 2019-03-14 DIAGNOSIS — I130 Hypertensive heart and chronic kidney disease with heart failure and stage 1 through stage 4 chronic kidney disease, or unspecified chronic kidney disease: Secondary | ICD-10-CM | POA: Diagnosis present

## 2019-03-14 DIAGNOSIS — N183 Chronic kidney disease, stage 3 unspecified: Secondary | ICD-10-CM | POA: Diagnosis present

## 2019-03-14 DIAGNOSIS — K589 Irritable bowel syndrome without diarrhea: Secondary | ICD-10-CM | POA: Diagnosis present

## 2019-03-14 DIAGNOSIS — Z881 Allergy status to other antibiotic agents status: Secondary | ICD-10-CM

## 2019-03-14 DIAGNOSIS — Z87891 Personal history of nicotine dependence: Secondary | ICD-10-CM

## 2019-03-14 LAB — CBC WITH DIFFERENTIAL/PLATELET
Abs Immature Granulocytes: 0.03 10*3/uL (ref 0.00–0.07)
Basophils Absolute: 0 10*3/uL (ref 0.0–0.1)
Basophils Relative: 1 %
Eosinophils Absolute: 0.1 10*3/uL (ref 0.0–0.5)
Eosinophils Relative: 1 %
HCT: 27.7 % — ABNORMAL LOW (ref 36.0–46.0)
Hemoglobin: 8.8 g/dL — ABNORMAL LOW (ref 12.0–15.0)
Immature Granulocytes: 0 %
Lymphocytes Relative: 15 %
Lymphs Abs: 1.1 10*3/uL (ref 0.7–4.0)
MCH: 29.2 pg (ref 26.0–34.0)
MCHC: 31.8 g/dL (ref 30.0–36.0)
MCV: 92 fL (ref 80.0–100.0)
Monocytes Absolute: 0.5 10*3/uL (ref 0.1–1.0)
Monocytes Relative: 7 %
Neutro Abs: 5.3 10*3/uL (ref 1.7–7.7)
Neutrophils Relative %: 76 %
Platelets: 228 10*3/uL (ref 150–400)
RBC: 3.01 MIL/uL — ABNORMAL LOW (ref 3.87–5.11)
RDW: 17.1 % — ABNORMAL HIGH (ref 11.5–15.5)
WBC: 7 10*3/uL (ref 4.0–10.5)
nRBC: 0 % (ref 0.0–0.2)

## 2019-03-14 LAB — COMPREHENSIVE METABOLIC PANEL
ALT: 20 U/L (ref 0–44)
AST: 20 U/L (ref 15–41)
Albumin: 3.9 g/dL (ref 3.5–5.0)
Alkaline Phosphatase: 56 U/L (ref 38–126)
Anion gap: 12 (ref 5–15)
BUN: 34 mg/dL — ABNORMAL HIGH (ref 8–23)
CO2: 23 mmol/L (ref 22–32)
Calcium: 9.1 mg/dL (ref 8.9–10.3)
Chloride: 103 mmol/L (ref 98–111)
Creatinine, Ser: 1.14 mg/dL — ABNORMAL HIGH (ref 0.44–1.00)
GFR calc Af Amer: 60 mL/min — ABNORMAL LOW (ref >60)
GFR calc non Af Amer: 51 mL/min — ABNORMAL LOW (ref >60)
Glucose, Bld: 106 mg/dL — ABNORMAL HIGH (ref 70–99)
Potassium: 3.5 mmol/L (ref 3.5–5.1)
Sodium: 138 mmol/L (ref 135–145)
Total Bilirubin: 0.3 mg/dL (ref 0.3–1.2)
Total Protein: 6.6 g/dL (ref 6.5–8.1)

## 2019-03-14 LAB — TYPE AND SCREEN
ABO/RH(D): A POS
Antibody Screen: NEGATIVE

## 2019-03-14 LAB — PROTIME-INR
INR: 1.2 (ref 0.8–1.2)
Prothrombin Time: 14.8 seconds (ref 11.4–15.2)

## 2019-03-14 LAB — POC OCCULT BLOOD, ED: Fecal Occult Bld: POSITIVE — AB

## 2019-03-14 MED ORDER — SODIUM CHLORIDE 0.9 % IV SOLN: INTRAVENOUS | Status: AC

## 2019-03-14 MED ORDER — FLUTICASONE PROPIONATE 50 MCG/ACT NA SUSP: 1.0000 | Freq: Every day | NASAL | Status: DC | PRN

## 2019-03-14 MED ORDER — LIFITEGRAST 5 % OP SOLN: 1.0000 [drp] | Freq: Two times a day (BID) | OPHTHALMIC | Status: DC

## 2019-03-14 MED ORDER — ATORVASTATIN CALCIUM 40 MG PO TABS
40.0000 mg | ORAL_TABLET | Freq: Every day | ORAL | Status: DC
  Administered 2019-03-15 – 2019-03-17 (×3): 40 mg via ORAL
  Filled 2019-03-14 (×3): qty 1

## 2019-03-14 MED ORDER — ATORVASTATIN CALCIUM 40 MG PO TABS: 40.0000 mg | ORAL_TABLET | Freq: Every day | ORAL | Status: DC

## 2019-03-14 MED ORDER — ADULT MULTIVITAMIN W/MINERALS CH
1.0000 | ORAL_TABLET | Freq: Every day | ORAL | Status: DC
  Administered 2019-03-14 – 2019-03-17 (×4): 1 via ORAL
  Filled 2019-03-14 (×4): qty 1

## 2019-03-14 MED ORDER — CAMPHOR-MENTHOL-METHYL SAL 3.1-6-10 % EX PTCH: 1.0000 | MEDICATED_PATCH | Freq: Every day | CUTANEOUS | Status: DC | PRN

## 2019-03-14 MED ORDER — FAMOTIDINE IN NACL 20-0.9 MG/50ML-% IV SOLN
20.0000 mg | Freq: Two times a day (BID) | INTRAVENOUS | Status: DC
  Administered 2019-03-14 – 2019-03-16 (×4): 20 mg via INTRAVENOUS
  Filled 2019-03-14 (×4): qty 50

## 2019-03-14 MED ORDER — SALINE SPRAY 0.65 % NA SOLN: 1.0000 | Freq: Two times a day (BID) | NASAL | Status: DC | PRN

## 2019-03-14 MED ORDER — LOSARTAN POTASSIUM 50 MG PO TABS
25.0000 mg | ORAL_TABLET | Freq: Every day | ORAL | Status: DC
  Administered 2019-03-15 – 2019-03-17 (×3): 25 mg via ORAL
  Filled 2019-03-14 (×3): qty 1

## 2019-03-14 MED ORDER — ACETAMINOPHEN 325 MG PO TABS
650.0000 mg | ORAL_TABLET | Freq: Four times a day (QID) | ORAL | Status: DC | PRN
  Administered 2019-03-14 – 2019-03-15 (×3): 650 mg via ORAL
  Filled 2019-03-14 (×3): qty 2

## 2019-03-14 MED ORDER — ACETAMINOPHEN 650 MG RE SUPP: 650.0000 mg | Freq: Four times a day (QID) | RECTAL | Status: DC | PRN

## 2019-03-14 MED ORDER — LORATADINE 10 MG PO TABS
10.0000 mg | ORAL_TABLET | Freq: Every day | ORAL | Status: DC
  Administered 2019-03-14 – 2019-03-17 (×4): 10 mg via ORAL
  Filled 2019-03-14 (×4): qty 1

## 2019-03-14 MED ORDER — LINACLOTIDE 145 MCG PO CAPS
145.0000 ug | ORAL_CAPSULE | Freq: Every day | ORAL | Status: DC
  Administered 2019-03-15: 145 ug via ORAL
  Filled 2019-03-14: qty 1

## 2019-03-14 MED ORDER — DOCUSATE SODIUM 100 MG PO CAPS: 100.0000 mg | ORAL_CAPSULE | Freq: Two times a day (BID) | ORAL | Status: DC | PRN

## 2019-03-14 MED ORDER — ARTIFICIAL TEARS OPHTHALMIC OINT
TOPICAL_OINTMENT | OPHTHALMIC | Status: DC | PRN
  Filled 2019-03-14: qty 3.5

## 2019-03-14 MED ORDER — ESCITALOPRAM OXALATE 10 MG PO TABS
20.0000 mg | ORAL_TABLET | Freq: Every day | ORAL | Status: DC
  Administered 2019-03-14 – 2019-03-16 (×3): 20 mg via ORAL
  Filled 2019-03-14 (×3): qty 2

## 2019-03-14 MED ORDER — VITAMIN D 25 MCG (1000 UNIT) PO TABS
1000.0000 [IU] | ORAL_TABLET | Freq: Every evening | ORAL | Status: DC
  Administered 2019-03-14 – 2019-03-16 (×3): 1000 [IU] via ORAL
  Filled 2019-03-14 (×3): qty 1

## 2019-03-14 MED ORDER — SIMETHICONE 80 MG PO CHEW
80.0000 mg | CHEWABLE_TABLET | Freq: Two times a day (BID) | ORAL | Status: DC | PRN
  Administered 2019-03-14 – 2019-03-16 (×2): 80 mg via ORAL
  Filled 2019-03-14 (×2): qty 1

## 2019-03-14 MED ORDER — SODIUM CHLORIDE 0.9 % IV BOLUS
1000.0000 mL | Freq: Once | INTRAVENOUS | Status: AC
  Administered 2019-03-14: 1000 mL via INTRAVENOUS

## 2019-03-14 MED ORDER — SPIRONOLACTONE 25 MG PO TABS
50.0000 mg | ORAL_TABLET | Freq: Two times a day (BID) | ORAL | Status: DC
  Administered 2019-03-14 – 2019-03-17 (×6): 50 mg via ORAL
  Filled 2019-03-14 (×6): qty 2

## 2019-03-14 MED ORDER — BUPROPION HCL ER (SR) 100 MG PO TB12
100.0000 mg | ORAL_TABLET | Freq: Two times a day (BID) | ORAL | Status: DC
  Administered 2019-03-14 – 2019-03-17 (×6): 100 mg via ORAL
  Filled 2019-03-14 (×6): qty 1

## 2019-03-14 MED ORDER — BUMETANIDE 1 MG PO TABS
1.0000 mg | ORAL_TABLET | Freq: Two times a day (BID) | ORAL | Status: DC
  Administered 2019-03-15 – 2019-03-17 (×5): 1 mg via ORAL
  Filled 2019-03-14 (×5): qty 1

## 2019-03-14 NOTE — H&P (Addendum)
TRH H&P    Patient Demographics:    Deanna Thompson, is a 63 y.o. female  MRN: [id number]  DOB - 22-Nov-1955  Admit Date - 03/14/2019  Referring MD/NP/PA:  Trish Mage  Outpatient Primary MD for the patient is Luking, Grace Bushy, MD  Patient coming from:  home  Chief complaint-  Gi bleeding    HPI:    [name]  is a 63 y.o. female,  w Depression, Seizures, Hypertension, CKD stage 3, Cardiomyopathy (EF 45-50%), w mild aortic stenosis, mild mitral regurgitation,  h/o TIA, h/o Brain aneurysm, w metal coil and clip (No MRI please),  B12 deficiency, Psoriatic arthritis, Sjogrens disease,  Iron deficiency anemia, prior colonoscopy [id number], Alcoholic Cirrhosis,  w recent EGD 03/07/19-> esophagitis, angiodysplasia of stomach, presents with c/o nausea, dizziness, rectal bleeding, black and tarry stools starting 1 day ago. Pt has had 2 black bm. Pt denies fever, chills, abd pain, diarrhea, brbpr.    In Ed,  T 98.4, P 93 R 20, Bp 109/78  Pox 100%   Wbc 7.0 Hgb 8.8, Plt 228 Na 138, K 3.5  Bun 34, Creatinine 1.14 Ast 20, Alt 20 INR 1.2 Glucose 106  Pt will be admitted for rectal bleeding.      Review of systems:    In addition to the HPI above,  No Fever-chills, No Headache, No changes with Vision or hearing, No problems swallowing food or Liquids, No Chest pain, Cough or Shortness of Breath, No Abdominal pain, No Nausea or Vomiting,  No Blood in Urine, No dysuria, No new skin rashes or bruises, No new joints pains-aches,  No new weakness, tingling, numbness in any extremity, No recent weight gain or loss, No polyuria, polydypsia or polyphagia, No significant Mental Stressors.  All other systems reviewed and are negative.    Past History of the following :    Past Medical History:  Diagnosis Date  . Abdominal hernia    repaired  . Abscess of pulp of tooth 12/12/14   left  side  . Anxiety   . Arthritis    back  and down flank  right side more than left  . B12 deficiency 06/12/2015  . Brain aneurysm    METAL COIL PREVENTS MRI IMAGING and CLIP  . Cirrhosis ([place])    Presumably alcoholic cirrhosis  . Complication of anesthesia   . Depression   . ETOH abuse    Quit in 07/2011   . Fatigue    due to Sjogren's disease  . Folate deficiency 06/30/2016  . GERD (gastroesophageal reflux disease)   . HTN (hypertension)    no longer medicated, dated on 04/25/12  . IBS (irritable bowel syndrome)   . Iron deficiency anemia due to chronic blood loss   . Myofascial pain   . Panic attack   . PONV (postoperative nausea and vomiting)   . Psoriatic arthritis ([place])   . Right inguinal hernia 08/16/2012  . Seizures ([place]) 1997   1 seizure from coil insertion but no more seizures and no meds for seizures  .  Sjogren's disease ([place])   . [place] HEAD 02/03/2007   Qualifier: Diagnosis of  By: Aline Brochure MD, Dorothyann Peng    . TIA (transient ischemic attack)   . 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([place])   . Vertigo       Past Surgical History:  Procedure Laterality Date  . AGILE CAPSULE N/A 06/21/2014   Procedure: AGILE CAPSULE;  Surgeon: Danie Binder, MD;  [facility];  Service: Endoscopy;  Laterality: N/A;  0700  . BIOPSY  10/13/2011   [facility]: mild gastritis/duodenal polypoid lesion   . CEREBRAL ANEURYSM REPAIR  1997  . CHOLECYSTECTOMY N/A 10/28/2018   Procedure: LAPAROSCOPIC CHOLECYSTECTOMY WITH INTRAOPERATIVE CHOLANGIOGRAM;  Surgeon: Virl Cagey, MD;  Location: [facility];  Service: General;  Laterality: N/A;  . COLONOSCOPY  10/13/2011   [id number] hemorrhoids/varices rectal/lesion in ascending colon(HYPERPLASTIC POLYP)  . COLONOSCOPY N/A 01/17/2013   [facility]: 1. Internal hemorrhoids 2. Ascending colon polyps 3.  Rectal varices.   . COLONOSCOPY N/A 08/10/2016   Dr. Oneida Alar: redundant left colon. external and internal hemorhroids. 5-10 year surveillance    . ESOPHAGOGASTRODUODENOSCOPY N/A 06/29/2014   [facility]F: 1. anemia due to polypoid lesions in teh antrum, and duodenal polyps 2. single duodenum AVM  . ESOPHAGOGASTRODUODENOSCOPY (EGD) WITH PROPOFOL N/A 03/07/2019   Procedure: ESOPHAGOGASTRODUODENOSCOPY (EGD) WITH PROPOFOL;  Surgeon: Danie Binder, MD;  [facility];  Service: Endoscopy;  Laterality: N/A;  1:00pm  . FLEXIBLE SIGMOIDOSCOPY N/A 01/21/2016   Procedure: FLEXIBLE SIGMOIDOSCOPY;  Surgeon: Danie Binder, MD;  [facility];  Service: Endoscopy;  Laterality: N/A;  245  . GIVENS CAPSULE STUDY N/A 06/29/2014   Procedure: GIVENS CAPSULE STUDY;  Surgeon: Danie Binder, MD;  [facility];  Service: Endoscopy;  Laterality: N/A;  . GIVENS CAPSULE STUDY N/A 07/13/2014   Procedure: GIVENS CAPSULE STUDY;  Surgeon: Danie Binder, MD;  [facility];  Service: Endoscopy;  Laterality: N/A;  700  . HEMORRHOID BANDING N/A 01/21/2016   Procedure: HEMORRHOID BANDING;  Surgeon: Danie Binder, MD;  [facility];  Service: Endoscopy;  Laterality: N/A;  . INGUINAL HERNIA REPAIR Right 08/28/2013   Procedure: RIGHT NGUINAL HERNIORRHAPHY WITH MESH;  Surgeon: Jamesetta So, MD;  Location: [facility];  Service: General;  Laterality: Right;  . INSERTION OF MESH N/A 06/09/2013   Procedure: INSERTION OF MESH;  Surgeon: Jamesetta So, MD;  Location: [facility];  Service: General;  Laterality: N/A;  . INSERTION OF MESH Right 08/28/2013   Procedure: INSERTION OF MESH;  Surgeon: Jamesetta So, MD;  Location: [facility];  Service: General;  Laterality: Right;  . PARACENTESIS  Feb 2014   10.7 liters  . TOOTH EXTRACTION Left 12/12/14  . UMBILICAL HERNIA REPAIR N/A 06/09/2013   Procedure: UMBILICAL HERNIORRHAPHY WITH MESH;  Surgeon: Jamesetta So, MD;  Location: [facility];  Service: General;  Laterality: N/A;      Social History:      Social History   Tobacco Use  . Smoking status: Former Smoker    Packs/day: 2.00    Years:  12.00    Pack years: 24.00    Types: Cigarettes    Quit date: 03/31/1995    Years since quitting: 23.9  . Smokeless tobacco: Never Used  . Tobacco comment: quit in 1997  Substance Use Topics  . Alcohol use: No    Comment: Bottle of wine a day for years, but quit in 07/2011  Family History :     Family History  Problem Relation Age of Onset  . Cirrhosis Mother        nash  . COPD Father   . Heart disease Father   . Asthma Other   . Heart disease Other   . Colon cancer Neg Hx   . Inflammatory bowel disease Neg Hx        Home Medications:   Prior to Admission medications   Medication Sig Start Date End Date Taking? Authorizing Provider  aspirin 325 MG tablet Take 1 tablet (325 mg total) by mouth daily. 10/31/18   Virl Cagey, MD  atorvastatin (LIPITOR) 40 MG tablet TAKE (1) TABLET BY MOUTH ONCE DAILY. Patient taking differently: Take 40 mg by mouth daily.  11/21/18   Mikey Kirschner, MD  bumetanide (BUMEX) 1 MG tablet TAKE (1) TABLET BY MOUTH TWICE DAILY. Patient taking differently: Take 1 mg by mouth 2 (two) times daily.  12/13/18   Carlis Stable, NP  buPROPion (WELLBUTRIN SR) 100 MG 12 hr tablet TAKE (1) TABLET BY MOUTH TWICE DAILY. Patient taking differently: Take 100 mg by mouth 2 (two) times daily.  10/17/18   Mikey Kirschner, MD  Camphor-Menthol-Methyl Sal (SALONPAS) 3.04-04-08 % PTCH Place 1 patch onto the skin daily as needed (pain.).    [provider]  cholecalciferol (VITAMIN D3) 25 MCG (1000 UT) tablet Take 2 tablets (2,000 Units total) by mouth daily. Patient taking differently: Take 1,000 Units by mouth every evening.  12/14/18   Fields, Marga Melnick, MD  docusate sodium (COLACE) 100 MG capsule Take 100-200 mg by mouth 2 (two) times daily as needed (constipation.).     [provider]  escitalopram (LEXAPRO) 20 MG tablet TAKE (1) TABLET BY MOUTH AT BEDTIME. Patient taking differently: Take 20 mg by mouth at bedtime.  10/17/18   Mikey Kirschner, MD  famotidine (PEPCID) 20 MG tablet Take 20 mg by mouth daily.    [provider]  fluticasone (FLONASE) 50 MCG/ACT nasal spray Place 1 spray into both nostrils See admin instructions. Instill 1 spray into each nostril every morning, may also use later in the day as needed for sinus congestion    [provider]  hydrOXYzine (ATARAX/VISTARIL) 25 MG tablet Take one tablet by mouth every 6 hrs prn itching 10/18/18   Mikey Kirschner, MD  Lifitegrast Shirley Friar) 5 % SOLN Place 1 drop into both eyes 2 (two) times daily.    [provider]  linaclotide (LINZESS) 145 MCG CAPS capsule Take 145 mcg by mouth daily before breakfast.    [provider]  loratadine (CLARITIN) 10 MG tablet Take 10 mg by mouth daily.    [provider]  losartan (COZAAR) 25 MG tablet TAKE (1) TABLET BY MOUTH ONCE DAILY. Patient taking differently: Take 25 mg by mouth daily.  10/17/18   Mikey Kirschner, MD  Multiple Vitamin (MULTIVITAMIN WITH MINERALS) TABS tablet Take 1 tablet by mouth daily.    [provider]  Polyvinyl Alcohol-Povidone (REFRESH OP) Place 1 drop into both eyes 3 (three) times daily as needed (dry eyes).    [provider]  Simethicone (GAS-X PO) Take 1 tablet by mouth 2 (two) times daily as needed (bloating/flatulence).    [provider]  sodium chloride (OCEAN) 0.65 % nasal spray Place 1 spray into the nose 2 (two) times daily as needed for congestion.     [provider]  spironolactone (ALDACTONE) 100  MG tablet TAKE (1) TABLET BY MOUTH TWICE DAILY. Patient taking differently: Take 50 mg by mouth 2 (two) times daily.  10/18/18   Mahala Menghini, PA-C  Tofacitinib Citrate ER (XELJANZ XR) 11 MG TB24 Take 11 mg by mouth daily.     [provider]     Allergies:     Allergies  Allergen Reactions  . Omeprazole Shortness Of Breath  . Orencia [Abatacept] Anaphylaxis, Hives and Rash  . Other Shortness Of Breath and  Rash    Red Meat Causes shortness of breath, rash and flu like symptoms.   . Phenytoin Rash  . Keflex [Cephalexin] Other (See Comments)    Severe yeast infection  . Phenergan [Promethazine Hcl] Other (See Comments)    Not in right state of mind, HALLUCINATION     Physical Exam:   Vitals  Blood pressure 109/78, pulse 93, temperature 98.4 F (36.9 C), temperature source Oral, resp. rate 20, height 5\' 8"  (1.727 m), SpO2 100 %.  1.  General: axoxo3  2. Psychiatric: euthymic  3. Neurologic: Nonfocal, cn2-12 intact, reflexes 2+ symmetric, direct,   4. HEENMT:  Anicteric, pale conjuctiva, pupils 1.37mm symmetric, direct, consensual Neck:  No jvd  5. Respiratory : CTAB  6. Cardiovascular : rrr s1, s2,   7. Gastrointestinal:  Abd: soft, nt, nd, +bs  8. Skin:  Ext: no c/c/e, no rash  9.Musculoskeletal:  Good ROM    Data Review:    CBC Recent Labs  Lab 03/14/19 1552  WBC 7.0  HGB 8.8*  HCT 27.7*  PLT 228  MCV 92.0  MCH 29.2  MCHC 31.8  RDW 17.1*  LYMPHSABS 1.1  MONOABS 0.5  EOSABS 0.1  BASOSABS 0.0   ------------------------------------------------------------------------------------------------------------------  Results for orders placed or performed during the hospital encounter of 03/14/19 (from the past 48 hour(s))  POC occult blood, ED Provider will collect     Status: Abnormal   Collection Time: 03/14/19  3:37 PM  Result Value Ref Range   Fecal Occult Bld POSITIVE (A) NEGATIVE  CBC with Differential     Status: Abnormal   Collection Time: 03/14/19  3:52 PM  Result Value Ref Range   WBC 7.0 4.0 - 10.5 K/uL   RBC 3.01 (L) 3.87 - 5.11 MIL/uL   Hemoglobin 8.8 (L) 12.0 - 15.0 g/dL   HCT 27.7 (L) 36.0 - 46.0 %   MCV 92.0 80.0 - 100.0 fL   MCH 29.2 26.0 - 34.0 pg   MCHC 31.8 30.0 - 36.0 g/dL   RDW 17.1 (H) 11.5 - 15.5 %   Platelets 228 150 - 400 K/uL   nRBC 0.0 0.0 - 0.2 %   Neutrophils Relative % 76 %   Neutro Abs 5.3 1.7 - 7.7 K/uL    Lymphocytes Relative 15 %   Lymphs Abs 1.1 0.7 - 4.0 K/uL   Monocytes Relative 7 %   Monocytes Absolute 0.5 0.1 - 1.0 K/uL   Eosinophils Relative 1 %   Eosinophils Absolute 0.1 0.0 - 0.5 K/uL   Basophils Relative 1 %   Basophils Absolute 0.0 0.0 - 0.1 K/uL   Immature Granulocytes 0 %   Abs Immature Granulocytes 0.03 0.00 - 0.07 K/uL    Comment: Performed at [facility], [address] [place], [place] [address]  Comprehensive metabolic panel     Status: Abnormal   Collection Time: 03/14/19  3:52 PM  Result Value Ref Range   Sodium 138 135 - 145 mmol/L   Potassium 3.5  3.5 - 5.1 mmol/L   Chloride 103 98 - 111 mmol/L   CO2 23 22 - 32 mmol/L   Glucose, Bld 106 (H) 70 - 99 mg/dL   BUN 34 (H) 8 - 23 mg/dL   Creatinine, Ser 1.14 (H) 0.44 - 1.00 mg/dL   Calcium 9.1 8.9 - 10.3 mg/dL   Total Protein 6.6 6.5 - 8.1 g/dL   Albumin 3.9 3.5 - 5.0 g/dL   AST 20 15 - 41 U/L   ALT 20 0 - 44 U/L   Alkaline Phosphatase 56 38 - 126 U/L   Total Bilirubin 0.3 0.3 - 1.2 mg/dL   GFR calc non Af Amer 51 (L) >60 mL/min   GFR calc Af Amer 60 (L) >60 mL/min   Anion gap 12 5 - 15    Comment: Performed at [facility], [address] [place], [place] [address]  Protime-INR     Status: None   Collection Time: 03/14/19  3:52 PM  Result Value Ref Range   Prothrombin Time 14.8 11.4 - 15.2 seconds   INR 1.2 0.8 - 1.2    Comment: (NOTE) INR goal varies based on device and disease states. Performed at [facility], [address], [place], [place] [address]   Type and screen     Status: None   Collection Time: 03/14/19  3:52 PM  Result Value Ref Range   ABO/RH(D) A POS    Antibody Screen NEG    Sample Expiration      03/17/2019,2359 Performed at [facility], [address] [place], [place] [address]     Chemistries  Recent Labs  Lab 03/14/19 1552  NA 138  K 3.5  CL 103  CO2 23  GLUCOSE 106*  BUN 34*  CREATININE 1.14*  CALCIUM 9.1  AST 20  ALT 20  ALKPHOS 56  BILITOT 0.3    ------------------------------------------------------------------------------------------------------------------  ------------------------------------------------------------------------------------------------------------------ GFR: Estimated Creatinine Clearance: 66.9 mL/min (A) (by C-G formula based on SCr of 1.14 mg/dL (H)). Liver Function Tests: Recent Labs  Lab 03/14/19 1552  AST 20  ALT 20  ALKPHOS 56  BILITOT 0.3  PROT 6.6  ALBUMIN 3.9   No results for input(s): LIPASE, AMYLASE in the last 168 hours. No results for input(s): AMMONIA in the last 168 hours. Coagulation Profile: Recent Labs  Lab 03/14/19 1552  INR 1.2   Cardiac Enzymes: No results for input(s): CKTOTAL, CKMB, CKMBINDEX, TROPONINI in the last 168 hours. BNP (last 3 results) No results for input(s): PROBNP in the last 8760 hours. HbA1C: No results for input(s): HGBA1C in the last 72 hours. CBG: No results for input(s): GLUCAP in the last 168 hours. Lipid Profile: No results for input(s): CHOL, HDL, LDLCALC, TRIG, CHOLHDL, LDLDIRECT in the last 72 hours. Thyroid Function Tests: No results for input(s): TSH, T4TOTAL, FREET4, T3FREE, THYROIDAB in the last 72 hours. Anemia Panel: No results for input(s): VITAMINB12, FOLATE, FERRITIN, TIBC, IRON, RETICCTPCT in the last 72 hours.  --------------------------------------------------------------------------------------------------------------- Urine analysis:    Component Value Date/Time   COLORURINE AMBER (A) 10/21/2018 0010   APPEARANCEUR CLEAR 10/21/2018 0010   LABSPEC >1.046 (H) 10/21/2018 0010   PHURINE 5.0 10/21/2018 0010   GLUCOSEU NEGATIVE 10/21/2018 0010   HGBUR NEGATIVE 10/21/2018 0010   BILIRUBINUR MODERATE (A) 10/21/2018 0010   KETONESUR 5 (A) 10/21/2018 0010   PROTEINUR NEGATIVE 10/21/2018 0010   UROBILINOGEN 0.2 06/08/2013 1943   NITRITE NEGATIVE 10/21/2018 0010   LEUKOCYTESUR NEGATIVE 10/21/2018 0010      Imaging Results:  No results found.     Assessment & Plan:    Active Problems:   GI bleeding  Rectal bleeding Unclear if upper or lower Type and screen Serial Cbc Pepcid 20mg  iv bid since allerghic to PPI=> SOB NPO GI consulted by ED, appreciate input  Anemia Check cbc in am  Cirrhosis (? Alcoholic) Cont Bumex Cont Spironolactone  IBS, Constipation Cont Linzess  H/o TIA STOP aspirin due to concern for GI bleeding Risk and benefit discussed with patient  Cont Lipitor 40mg  po qhs  Cardiomyopathy (EF 45-50%) Cont Losartan 25mg  po qday Cont Bumex/ Spironolactone as above  Anxiety Cont Wellbutrin SR 100mg  po bid Cont Lexapro 20mg  po qhs  Psoriasis Hold Xiidra  Sjogrens Refresh  DVT Prophylaxis-    SCDs  AM Labs Ordered, also please review Full Orders  Family Communication: Admission, patients condition and plan of care including tests being ordered have been discussed with the patient  who indicate understanding and agree with the plan and Code Status.  Code Status:   FULL CODE per patient   Admission status: Observation: Based on patients clinical presentation and evaluation of above clinical data, I have made determination that patient meets Observation criteria at this time.   Time spent in minutes : 70 minutes   Jani Gravel M.D on 03/14/2019 at 5:27 PM

## 2019-03-14 NOTE — Telephone Encounter (Signed)
Spoke with pt. With the symptoms pt is having, she was advised to go to the Ed for evaluation and call her PCP.

## 2019-03-14 NOTE — Telephone Encounter (Signed)
FYI Pt called with c/o chills, sleeping for 20 hours or more, dizziness, nausea, no vomiting, cold like symptoms. Pt wanted to ask [facility] if she needed to get another covid test done. Pt is aware that the coved test completed last week prior to her procedure was completed so she could have her procedure. Pt was advised to contact her PCP and let them know the symptoms she is having.

## 2019-03-14 NOTE — Telephone Encounter (Signed)
Noted. Agree with recommendations.  

## 2019-03-14 NOTE — ED Triage Notes (Signed)
C/o nausea, dizziness, rectal bleeding, black tarry stools onset 3 days ago. States she take 325 mg aspirin daily

## 2019-03-14 NOTE — Telephone Encounter (Signed)
Pt called again and feels very faint when standing up. Pt advised to go to ED for evaluation. Pt was also advised to call her PCP.

## 2019-03-14 NOTE — Telephone Encounter (Signed)
Pt called back to let us know that she is now having black diarrhea. It just started this morning and patient said that she hasn't eaten anything in 2 days. Please advise. [phone]

## 2019-03-14 NOTE — Telephone Encounter (Signed)
Pt has questions about covid and has some concerns. I told her DS was not here today and I could let the other nurse be aware. Pt would like to hear something back today if possible. [phone]

## 2019-03-14 NOTE — ED Provider Notes (Addendum)
[facility] EMERGENCY DEPARTMENT Provider Note   CSN: [id number] Arrival date & time: 03/14/19  1423     History Chief Complaint  Patient presents with  . Rectal Bleeding    Deanna Thompson is a 63 y.o. female.  Patient is a 63 year old female with history of cirrhosis, irritable bowel, gastric AVM, GERD.  She presents today for evaluation of dark stools, dizziness, and decreased appetite.  Patient states that she has felt nauseated but has not eaten much for the past 3 days.  Yesterday and today she has had loose, black stools.  Today she stood and became lightheaded.  She called her GI doctor's office and was told to come to the ER for evaluation.  She denies pain or fever.  She has had similar episodes in the past and has undergone extensive work-up.  She had upper and lower endoscopy as well as capsule endoscopy, however no definitive cause was found.  Patient also had an endoscopy 1 week ago which was routine and is being performed to monitor for varices.  The history is provided by the patient.  Rectal Bleeding Quality:  Black and tarry Amount:  Moderate Duration:  24 hours Timing:  Intermittent Chronicity:  Recurrent Relieved by:  Nothing Worsened by:  Nothing Ineffective treatments:  None tried Associated symptoms: light-headedness   Associated symptoms: no abdominal pain, no fever and no hematemesis   Risk factors comment:  Aspirin 325 mg      Past Medical History:  Diagnosis Date  . Abdominal hernia    repaired  . Abscess of pulp of tooth 12/12/14   left side  . Anxiety   . Arthritis    back  and down flank  right side more than left  . B12 deficiency 06/12/2015  . Brain aneurysm    METAL COIL PREVENTS MRI IMAGING and CLIP  . Cirrhosis ([place])    Presumably alcoholic cirrhosis  . Complication of anesthesia   . Depression   . ETOH abuse    Quit in 07/2011   . Fatigue    due to Sjogren's disease  . Folate deficiency 06/30/2016  . GERD (gastroesophageal  reflux disease)   . HTN (hypertension)    no longer medicated, dated on 04/25/12  . IBS (irritable bowel syndrome)   . Iron deficiency anemia due to chronic blood loss   . Myofascial pain   . Panic attack   . PONV (postoperative nausea and vomiting)   . Psoriatic arthritis ([place])   . Right inguinal hernia 08/16/2012  . Seizures ([place]) 1997   1 seizure from coil insertion but no more seizures and no meds for seizures  . Sjogren's disease ([place])   . [place] HEAD 02/03/2007   Qualifier: Diagnosis of  By: Aline Brochure MD, Dorothyann Peng    . TIA (transient ischemic attack)   . 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([place])   . Vertigo     Patient Active Problem List   Diagnosis Date Noted  . Gastric AVM   . Postoperative anemia due to acute blood loss 10/29/2018  . Gallstone pancreatitis   . Elevated liver enzymes   . Non-intractable vomiting   . Abstinence from alcohol   . Cholecystitis, acute 10/20/2018  . Benign paroxysmal positional vertigo   . 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 Essential hypertension 07/31/2017  . GERD (gastroesophageal reflux disease) 07/31/2017  . Alcohol abuse 07/31/2017  . Brain aneurysm 07/31/2017  . CKD (chronic kidney disease), stage III 07/31/2017  . LBBB (left bundle branch block) 07/31/2017  .  Dizziness 07/31/2017  . Folate deficiency 06/30/2016  . Constipation 04/01/2016  . 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 Hemorrhoids, internal, with bleeding 01/08/2016  . B12 deficiency 06/12/2015  . Psoriatic arthritis ([place]) 04/15/2015  . Nutritional anemia 09/19/2014  . Cirrhosis of liver ([place]) 09/19/2014  . Iron deficiency anemia due to chronic blood loss   . Insomnia 03/17/2014  . Depression 02/18/2014  . Labial swelling 08/23/2012  . Rash and nonspecific skin eruption 06/30/2012  . Colon polyps 11/18/2011  . Cirrhosis ([place]) 08/19/2011    Past Surgical History:  Procedure Laterality Date  . AGILE CAPSULE N/A 06/21/2014   Procedure: AGILE CAPSULE;   Surgeon: Danie Binder, MD;  Location: [facility];  Service: Endoscopy;  Laterality: N/A;  0700  . BIOPSY  10/13/2011   SLF: mild gastritis/duodenal polypoid lesion   . CEREBRAL ANEURYSM REPAIR  1997  . CHOLECYSTECTOMY N/A 10/28/2018   Procedure: LAPAROSCOPIC CHOLECYSTECTOMY WITH INTRAOPERATIVE CHOLANGIOGRAM;  Surgeon: Virl Cagey, MD;  Location: [facility] ORS;  Service: General;  Laterality: N/A;  . COLONOSCOPY  10/13/2011   [id number] hemorrhoids/varices rectal/lesion in ascending colon(HYPERPLASTIC POLYP)  . COLONOSCOPY N/A 01/17/2013   SLF: 1. Internal hemorrhoids 2. Ascending colon polyps 3.  Rectal varices.   . COLONOSCOPY N/A 08/10/2016   Dr. Oneida Alar: redundant left colon. external and internal hemorhroids. 5-10 year surveillance  . ESOPHAGOGASTRODUODENOSCOPY N/A 06/29/2014   SLF: 1. anemia due to polypoid lesions in teh antrum, and duodenal polyps 2. single duodenum AVM  . ESOPHAGOGASTRODUODENOSCOPY (EGD) WITH PROPOFOL N/A 03/07/2019   Procedure: ESOPHAGOGASTRODUODENOSCOPY (EGD) WITH PROPOFOL;  Surgeon: Danie Binder, MD;  [facility];  Service: Endoscopy;  Laterality: N/A;  1:00pm  . FLEXIBLE SIGMOIDOSCOPY N/A 01/21/2016   Procedure: FLEXIBLE SIGMOIDOSCOPY;  Surgeon: Danie Binder, MD;  [facility]: [facility];  Service: Endoscopy;  Laterality: N/A;  245  . GIVENS CAPSULE STUDY N/A 06/29/2014   Procedure: GIVENS CAPSULE STUDY;  Surgeon: Danie Binder, MD;  [facility]: [facility];  Service: Endoscopy;  Laterality: N/A;  . GIVENS CAPSULE STUDY N/A 07/13/2014   Procedure: GIVENS CAPSULE STUDY;  Surgeon: Danie Binder, MD;  [facility];  Service: Endoscopy;  Laterality: N/A;  700  . HEMORRHOID BANDING N/A 01/21/2016   Procedure: HEMORRHOID BANDING;  Surgeon: Danie Binder, MD;  [facility];  Service: Endoscopy;  Laterality: N/A;  . INGUINAL HERNIA REPAIR Right 08/28/2013   Procedure: RIGHT NGUINAL HERNIORRHAPHY WITH MESH;  Surgeon: Jamesetta So, MD;   Location: [facility] ORS;  Service: General;  Laterality: Right;  . INSERTION OF MESH N/A 06/09/2013   Procedure: INSERTION OF MESH;  Surgeon: Jamesetta So, MD;  Location: [facility];  Service: General;  Laterality: N/A;  . INSERTION OF MESH Right 08/28/2013   Procedure: INSERTION OF MESH;  Surgeon: Jamesetta So, MD;  Location: [facility];  Service: General;  Laterality: Right;  . PARACENTESIS  Feb 2014   10.7 liters  . TOOTH EXTRACTION Left 12/12/14  . UMBILICAL HERNIA REPAIR N/A 06/09/2013   Procedure: UMBILICAL HERNIORRHAPHY WITH MESH;  Surgeon: Jamesetta So, MD;  Location: [facility];  Service: General;  Laterality: N/A;     OB History    Gravida      Para      Term      Preterm      AB      Living  0     SAB      TAB  Ectopic      Multiple      Live Births              Family History  Problem Relation Age of Onset  . Cirrhosis Mother        nash  . COPD Father   . Heart disease Father   . Asthma Other   . Heart disease Other   . Colon cancer Neg Hx   . Inflammatory bowel disease Neg Hx     Social History   Tobacco Use  . Smoking status: Former Smoker    Packs/day: 2.00    Years: 12.00    Pack years: 24.00    Types: Cigarettes    Quit date: 03/31/1995    Years since quitting: 23.9  . Smokeless tobacco: Never Used  . Tobacco comment: quit in 1997  Substance Use Topics  . Alcohol use: No    Comment: Bottle of wine a day for years, but quit in 07/2011  . Drug use: No    Home Medications Prior to Admission medications   Medication Sig Start Date End Date Taking? Authorizing Provider  aspirin 325 MG tablet Take 1 tablet (325 mg total) by mouth daily. 10/31/18   Virl Cagey, MD  atorvastatin (LIPITOR) 40 MG tablet TAKE (1) TABLET BY MOUTH ONCE DAILY. Patient taking differently: Take 40 mg by mouth daily.  11/21/18   Mikey Kirschner, MD  bumetanide (BUMEX) 1 MG tablet TAKE (1) TABLET BY MOUTH TWICE DAILY. Patient taking differently: Take 1 mg by mouth 2  (two) times daily.  12/13/18   Carlis Stable, NP  buPROPion (WELLBUTRIN SR) 100 MG 12 hr tablet TAKE (1) TABLET BY MOUTH TWICE DAILY. Patient taking differently: Take 100 mg by mouth 2 (two) times daily.  10/17/18   Mikey Kirschner, MD  Camphor-Menthol-Methyl Sal (SALONPAS) 3.04-04-08 % PTCH Place 1 patch onto the skin daily as needed (pain.).    [provider]  cholecalciferol (VITAMIN D3) 25 MCG (1000 UT) tablet Take 2 tablets (2,000 Units total) by mouth daily. Patient taking differently: Take 1,000 Units by mouth every evening.  12/14/18   Fields, Marga Melnick, MD  docusate sodium (COLACE) 100 MG capsule Take 100-200 mg by mouth 2 (two) times daily as needed (constipation.).     [provider]  escitalopram (LEXAPRO) 20 MG tablet TAKE (1) TABLET BY MOUTH AT BEDTIME. Patient taking differently: Take 20 mg by mouth at bedtime.  10/17/18   Mikey Kirschner, MD  famotidine (PEPCID) 20 MG tablet Take 20 mg by mouth daily.    [provider]  fluticasone (FLONASE) 50 MCG/ACT nasal spray Place 1 spray into both nostrils See admin instructions. Instill 1 spray into each nostril every morning, may also use later in the day as needed for sinus congestion    [provider]  hydrOXYzine (ATARAX/VISTARIL) 25 MG tablet Take one tablet by mouth every 6 hrs prn itching 10/18/18   Mikey Kirschner, MD  Lifitegrast [place]) 5 % SOLN Place 1 drop into both eyes 2 (two) times daily.    [provider]  linaclotide (LINZESS) 145 MCG CAPS capsule Take 145 mcg by mouth daily before breakfast.    [provider]  loratadine (CLARITIN) 10 MG tablet Take 10 mg by mouth daily.    [provider]  losartan (COZAAR) 25 MG tablet TAKE (1) TABLET BY MOUTH ONCE DAILY. Patient taking differently: Take 25 mg by mouth daily.  10/17/18  Mikey Kirschner, MD  Multiple Vitamin (MULTIVITAMIN WITH MINERALS) TABS tablet Take 1 tablet by mouth daily.    [provider]    Polyvinyl Alcohol-Povidone (REFRESH OP) Place 1 drop into both eyes 3 (three) times daily as needed (dry eyes).    [provider]  Simethicone (GAS-X PO) Take 1 tablet by mouth 2 (two) times daily as needed (bloating/flatulence).    [provider]  sodium chloride (OCEAN) 0.65 % nasal spray Place 1 spray into the nose 2 (two) times daily as needed for congestion.     [provider]  spironolactone (ALDACTONE) 100 MG tablet TAKE (1) TABLET BY MOUTH TWICE DAILY. Patient taking differently: Take 50 mg by mouth 2 (two) times daily.  10/18/18   Mahala Menghini, PA-C  Tofacitinib Citrate ER (XELJANZ XR) 11 MG TB24 Take 11 mg by mouth daily.     [provider]    Allergies    Omeprazole, Orencia [abatacept], Other, Phenytoin, Keflex [cephalexin], and Phenergan [promethazine hcl]  Review of Systems   Review of Systems  Constitutional: Negative for fever.  Gastrointestinal: Positive for hematochezia. Negative for abdominal pain and hematemesis.  Neurological: Positive for light-headedness.    Physical Exam Updated Vital Signs BP 109/78   Pulse 93   Temp 98.4 F (36.9 C) (Oral)   Resp 20   Ht 5\' 8"  (1.727 m)   SpO2 100%   BMI 37.25 kg/m   Physical Exam Vitals and nursing note reviewed.  Constitutional:      General: She is not in acute distress.    Appearance: She is well-developed. She is not diaphoretic.  HENT:     Head: Normocephalic and atraumatic.  Cardiovascular:     Rate and Rhythm: Normal rate and regular rhythm.     Heart sounds: No murmur. No friction rub. No gallop.   Pulmonary:     Effort: Pulmonary effort is normal. No respiratory distress.     Breath sounds: Normal breath sounds. No wheezing.  Abdominal:     General: Bowel sounds are normal. There is no distension.     Palpations: Abdomen is soft.     Tenderness: There is no abdominal tenderness.  Musculoskeletal:        General: Normal range of motion.     Cervical back:  Normal range of motion and neck supple.  Skin:    General: Skin is warm and dry.  Neurological:     Mental Status: She is alert and oriented to person, place, and time.     ED Results / Procedures / Treatments   Labs (all labs ordered are listed, but only abnormal results are displayed) Labs Reviewed  CBC WITH DIFFERENTIAL/PLATELET  COMPREHENSIVE METABOLIC PANEL  PROTIME-INR  POC OCCULT BLOOD, ED  TYPE AND SCREEN    EKG None  Radiology No results found.  Procedures Procedures (including critical care time)  Medications Ordered in ED Medications  sodium chloride 0.9 % bolus 1,000 mL (has no administration in time range)    ED Course  I have reviewed the triage vital signs and the nursing notes.  Pertinent labs & imaging results that were available during my care of the patient were reviewed by me and considered in my medical decision making (see chart for details).  Patient with history of cirrhosis and prior GI bleed presenting with complaints of dizziness and dark stools.  Patient reports being presyncopal prior to coming here.  She then had 2 episodes of melanotic stool.  Patient's vitals upon arrival are stable and she is afebrile.  Her abdomen is benign.  Rectal examination does reveal melanotic, heme positive stools.  Laboratory studies reveal a hemoglobin of 8.8, which is several grams lower than her most previous result.  Care discussed with Dr. Gala Romney from GI.  He is recommending admission.  Dr. Maudie Mercury agrees to admit.  MDM Rules/Calculators/A&P  Final Clinical Impression(s) / ED Diagnoses Final diagnoses:  None    Rx / DC Orders ED Discharge Orders    None       Veryl Speak, MD 03/14/19 [id number]    Veryl Speak, MD 03/27/19 [phone]

## 2019-03-14 NOTE — Telephone Encounter (Signed)
Previous message sent  

## 2019-03-14 NOTE — Telephone Encounter (Signed)
Pt called back to notify our office that she had black diarrhea twice this morning. Pt missed taking Linzess two days in a row, that started Friday. Pt started the Linzess 145 mcg Sunday night. When pt messed taking her Linzess, she states that her symptoms of feeling dizzy, bloating, nausea with no vomiting. Pt hasn't had any straining. Pt states the color is black and she feels it looked like coffee but she glanced at it and isn't sure. Did mention to pt she may need ED evaluation. Please advise in the absence of SLf.

## 2019-03-14 NOTE — Telephone Encounter (Signed)
Pt has called back again to say that she is feeling "giddy" and fells faint when she stands up. She is worried that it could be a GI bleed. I told her that I would let the nurse be aware. [phone]

## 2019-03-15 DIAGNOSIS — I130 Hypertensive heart and chronic kidney disease with heart failure and stage 1 through stage 4 chronic kidney disease, or unspecified chronic kidney disease: Secondary | ICD-10-CM | POA: Diagnosis present

## 2019-03-15 DIAGNOSIS — L4050 Arthropathic psoriasis, unspecified: Secondary | ICD-10-CM | POA: Diagnosis present

## 2019-03-15 DIAGNOSIS — I5020 Unspecified systolic (congestive) heart failure: Secondary | ICD-10-CM | POA: Diagnosis present

## 2019-03-15 DIAGNOSIS — M3500 Sicca syndrome, unspecified: Secondary | ICD-10-CM | POA: Diagnosis present

## 2019-03-15 DIAGNOSIS — K7460 Unspecified cirrhosis of liver: Secondary | ICD-10-CM | POA: Diagnosis not present

## 2019-03-15 DIAGNOSIS — K219 Gastro-esophageal reflux disease without esophagitis: Secondary | ICD-10-CM | POA: Diagnosis present

## 2019-03-15 DIAGNOSIS — I080 Rheumatic disorders of both mitral and aortic valves: Secondary | ICD-10-CM | POA: Diagnosis present

## 2019-03-15 DIAGNOSIS — D649 Anemia, unspecified: Secondary | ICD-10-CM | POA: Diagnosis present

## 2019-03-15 DIAGNOSIS — K921 Melena: Secondary | ICD-10-CM | POA: Diagnosis not present

## 2019-03-15 DIAGNOSIS — F3341 Major depressive disorder, recurrent, in partial remission: Secondary | ICD-10-CM | POA: Diagnosis not present

## 2019-03-15 DIAGNOSIS — K625 Hemorrhage of anus and rectum: Secondary | ICD-10-CM | POA: Diagnosis present

## 2019-03-15 DIAGNOSIS — Z9049 Acquired absence of other specified parts of digestive tract: Secondary | ICD-10-CM | POA: Diagnosis not present

## 2019-03-15 DIAGNOSIS — Z20828 Contact with and (suspected) exposure to other viral communicable diseases: Secondary | ICD-10-CM | POA: Diagnosis present

## 2019-03-15 DIAGNOSIS — I429 Cardiomyopathy, unspecified: Secondary | ICD-10-CM | POA: Diagnosis present

## 2019-03-15 DIAGNOSIS — D500 Iron deficiency anemia secondary to blood loss (chronic): Secondary | ICD-10-CM | POA: Diagnosis not present

## 2019-03-15 DIAGNOSIS — Z8679 Personal history of other diseases of the circulatory system: Secondary | ICD-10-CM | POA: Diagnosis not present

## 2019-03-15 DIAGNOSIS — K589 Irritable bowel syndrome without diarrhea: Secondary | ICD-10-CM | POA: Diagnosis present

## 2019-03-15 DIAGNOSIS — N183 Chronic kidney disease, stage 3 unspecified: Secondary | ICD-10-CM | POA: Diagnosis present

## 2019-03-15 DIAGNOSIS — K7030 Alcoholic cirrhosis of liver without ascites: Secondary | ICD-10-CM | POA: Diagnosis present

## 2019-03-15 DIAGNOSIS — F419 Anxiety disorder, unspecified: Secondary | ICD-10-CM | POA: Diagnosis present

## 2019-03-15 DIAGNOSIS — M1990 Unspecified osteoarthritis, unspecified site: Secondary | ICD-10-CM | POA: Diagnosis present

## 2019-03-15 DIAGNOSIS — Z87891 Personal history of nicotine dependence: Secondary | ICD-10-CM | POA: Diagnosis not present

## 2019-03-15 DIAGNOSIS — K31811 Angiodysplasia of stomach and duodenum with bleeding: Secondary | ICD-10-CM | POA: Diagnosis present

## 2019-03-15 DIAGNOSIS — E538 Deficiency of other specified B group vitamins: Secondary | ICD-10-CM | POA: Diagnosis present

## 2019-03-15 DIAGNOSIS — G40909 Epilepsy, unspecified, not intractable, without status epilepticus: Secondary | ICD-10-CM | POA: Diagnosis present

## 2019-03-15 DIAGNOSIS — K922 Gastrointestinal hemorrhage, unspecified: Secondary | ICD-10-CM | POA: Diagnosis not present

## 2019-03-15 DIAGNOSIS — D62 Acute posthemorrhagic anemia: Secondary | ICD-10-CM | POA: Diagnosis present

## 2019-03-15 DIAGNOSIS — Z8673 Personal history of transient ischemic attack (TIA), and cerebral infarction without residual deficits: Secondary | ICD-10-CM | POA: Diagnosis not present

## 2019-03-15 DIAGNOSIS — D509 Iron deficiency anemia, unspecified: Secondary | ICD-10-CM | POA: Diagnosis present

## 2019-03-15 DIAGNOSIS — F329 Major depressive disorder, single episode, unspecified: Secondary | ICD-10-CM | POA: Diagnosis present

## 2019-03-15 DIAGNOSIS — F410 Panic disorder [episodic paroxysmal anxiety] without agoraphobia: Secondary | ICD-10-CM | POA: Diagnosis present

## 2019-03-15 LAB — IRON AND TIBC
Iron: 36 ug/dL (ref 28–170)
Saturation Ratios: 9 % — ABNORMAL LOW (ref 10.4–31.8)
TIBC: 412 ug/dL (ref 250–450)
UIBC: 376 ug/dL

## 2019-03-15 LAB — COMPREHENSIVE METABOLIC PANEL
ALT: 20 U/L (ref 0–44)
AST: 20 U/L (ref 15–41)
Albumin: 3.4 g/dL — ABNORMAL LOW (ref 3.5–5.0)
Alkaline Phosphatase: 50 U/L (ref 38–126)
Anion gap: 7 (ref 5–15)
BUN: 28 mg/dL — ABNORMAL HIGH (ref 8–23)
CO2: 24 mmol/L (ref 22–32)
Calcium: 8.6 mg/dL — ABNORMAL LOW (ref 8.9–10.3)
Chloride: 109 mmol/L (ref 98–111)
Creatinine, Ser: 1.16 mg/dL — ABNORMAL HIGH (ref 0.44–1.00)
GFR calc Af Amer: 58 mL/min — ABNORMAL LOW (ref >60)
GFR calc non Af Amer: 50 mL/min — ABNORMAL LOW (ref >60)
Glucose, Bld: 100 mg/dL — ABNORMAL HIGH (ref 70–99)
Potassium: 3.7 mmol/L (ref 3.5–5.1)
Sodium: 140 mmol/L (ref 135–145)
Total Bilirubin: 0.5 mg/dL (ref 0.3–1.2)
Total Protein: 5.6 g/dL — ABNORMAL LOW (ref 6.5–8.1)

## 2019-03-15 LAB — CBC
HCT: 24.4 % — ABNORMAL LOW (ref 36.0–46.0)
Hemoglobin: 7.7 g/dL — ABNORMAL LOW (ref 12.0–15.0)
MCH: 29.4 pg (ref 26.0–34.0)
MCHC: 31.6 g/dL (ref 30.0–36.0)
MCV: 93.1 fL (ref 80.0–100.0)
Platelets: 187 10*3/uL (ref 150–400)
RBC: 2.62 MIL/uL — ABNORMAL LOW (ref 3.87–5.11)
RDW: 17.1 % — ABNORMAL HIGH (ref 11.5–15.5)
WBC: 6.6 10*3/uL (ref 4.0–10.5)
nRBC: 0 % (ref 0.0–0.2)

## 2019-03-15 LAB — SARS CORONAVIRUS 2 (TAT 6-24 HRS): SARS Coronavirus 2: NEGATIVE

## 2019-03-15 LAB — HEMOGLOBIN AND HEMATOCRIT, BLOOD
HCT: 26.4 % — ABNORMAL LOW (ref 36.0–46.0)
Hemoglobin: 8.4 g/dL — ABNORMAL LOW (ref 12.0–15.0)

## 2019-03-15 LAB — FERRITIN: Ferritin: 12 ng/mL (ref 11–307)

## 2019-03-15 NOTE — Care Management Obs Status (Signed)
[place] NOTIFICATION   Patient Details  Name: Deanna Thompson MRN: [id number] Date of Birth: [birth year]   Medicare Observation Status Notification Given:  Yes    Tommy Medal 03/15/2019, 4:10 PM

## 2019-03-15 NOTE — Consult Note (Signed)
Referring Provider: Dr. Roxan Hockey Primary Care Physician:  Mikey Kirschner, MD Primary Gastroenterologist:  Dr. Barney Drain  Reason for Consultation: GI bleed  HPI: Humbird is a 63 y.o. female with a past medical history significant for depression, hypertension, cardiomyopathy, mild aortic stenosis, mild mitral regurgitation, TIA, brain aneurysm status post metal coil in clip 1997, seizure disorder, vitamin B12 deficiency, psoriatic arthritis, Sjogren's, chronic kidney disease stage III, iron deficiency anemia, GERD, gastric and duodenal AVMs and alcoholic cirrhosis diagnosed in 2013.  Status post cholecystectomy secondary to gallstones 09/2018.  She underwent a surveillance EGD by Dr. Lovey Newcomer fields 03/07/2019 which identified reflux esophagitis without evidence of esophageal varices.  A single bleeding AVM in the stomach was cauterized.  Erosive gastritis secondary to ASA was noted.  She was discharged home following procedure in stable condition without complaints.  On Friday, 03/10/2019, she felt hot and cold with the onset of mild dizziness.  She felt slightly fatigued for the next 2 days.  On Monday 03/13/2019 she passed a large loose black stool.  She continued to have slight dizziness. Tuesday 12/15 she passed a small or black loose stool in the morning.  She had nausea without vomiting.  No upper or lower abdominal pain.  She contacted Dr. Oneida Alar with concerns of continued black stool and she was sent to [facility] emergency room around 3 PM 03/14/2019.  Her admission hemoglobin was 8.8 down from a hemoglobin 12.2 on 02/09/2019.  FOBT was positive.  Last dose of Aspirin 325 mg was yesterday morning.  No prior history of upper GI bleed.  She remains n.p.o. in anticipation for repeat EGD, possible enteroscopy.  Currently, she has mild nausea.  No vomiting.  She has intermittent indigestion.  No upper or lower abdominal pain.  She last passed a small black stool yesterday  evening.  No BM yet this morning.  However, she received a dose of Linzess this morning she anticipates she will have more bowel movements later this morning.  She denies having any dizziness getting up to go to the bathroom.  She denies having any chest pain or shortness of breath.  He does not tolerate oral iron.  She last received an iron infusion approximately 5 months ago.  No alcohol is 2013.  No family history of upper GI or colorectal cancer.  She underwent a colonoscopy in 2018 which was normal.   ER course: Sodium 138.  Potassium 3.5.  Glucose 106.  BUN 34.  Creatinine 1.14.  Calcium 9.1.  Anion gap 12.  Alk phos 56.  AST 20.  ALT 20.  Total bili 0.3.  WBC 7.0.  Hemoglobin 8.8 (baseline hemoglobin 12.2 on 02/09/2019). Hematocrit 27.7.  Platelet 228.  INR 1.2.  Covid 2019 in process.   She was started on Famotidine 20 mg IV twice daily.  She is allergic to omeprazole with adverse effect including shortness of breath.  Labs 02/09/2019: Iron 81.  Ferritin 27.  TIBC 493.  Vitamin B12 227.  GI HISTORY:  EGD 03/07/2019: - LA Grade A reflux esophagitis with no bleeding. - A single recently bleeding angioectasia in the stomach. Treated with bipolar cautery. - EROSIVE Gastritis DUE TO ASA.  EGD 06/29/2014: Duodenal AVM Polypoid lesions in the antrum, duodenal polyps  Agile capsule 07/13/2014: RESULTS: LIMITED views of gastric mucosa due to retained contents.  BRIGHT RED BLOOD/PROBABLE AVM SEEN AT 2:22-2:25 .NO BLACK LIQUID IN LUMEN.  LIMITED VIEWS OF THE COLON DUE TO RETAINED CONTENTS. No  old blood or fresh blood in the stomach.  DIAGNOSIS: ACTIVE GI BLEED IN THE DISTAL JEJUNUM OR PROXIMAL ILEUM. REFER TO [facility] FOR RETROGRADE DBE. I was unable to locate the patient's [facility] retrograde DBE results in epic.   Colonoscopy 08/10/2016 by Dr. Lovey Newcomer fields: - Redundant LEFT colon. - The examination was otherwise normal. - External and internal hemorrhoids.  Abdominal/pelvic CT with contrast  10/20/2018: 1. Cholelithiasis with findings concerning for acute cholecystitis. Further evaluation with ultrasound is recommended. 2. Cirrhosis. 3. No bowel obstruction or active inflammation. Normal appendix. 4. No ascites. 5. Aortic Atherosclerosis   Right upper quadrant abdominal sonogram 10/21/2018: Layering stones and sludge within wall the gallbladder is upper limits. Gallbladder normal in thickness and there is no sonographic Murphy sign. No sonographic convincing evidence for acute cholecystitis. Changes of cirrhosis.  Past Medical History:  Diagnosis Date  . Abdominal hernia    repaired  . Abscess of pulp of tooth 12/12/14   left side  . Anxiety   . Arthritis    back  and down flank  right side more than left  . B12 deficiency 06/12/2015  . Brain aneurysm    METAL COIL PREVENTS MRI IMAGING and CLIP  . Cirrhosis ([place])    Presumably alcoholic cirrhosis  . Complication of anesthesia   . Depression   . ETOH abuse    Quit in 07/2011   . Fatigue    due to Sjogren's disease  . Folate deficiency 06/30/2016  . GERD (gastroesophageal reflux disease)   . HTN (hypertension)    no longer medicated, dated on 04/25/12  . IBS (irritable bowel syndrome)   . Iron deficiency anemia due to chronic blood loss   . Myofascial pain   . Panic attack   . PONV (postoperative nausea and vomiting)   . Psoriatic arthritis ([place])   . Right inguinal hernia 08/16/2012  . Seizures ([place]) 1997   1 seizure from coil insertion but no more seizures and no meds for seizures  . Sjogren's disease ([place])   . [place] HEAD 02/03/2007   Qualifier: Diagnosis of  By: Aline Brochure MD, Dorothyann Peng    . TIA (transient ischemic attack)   . 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([place])   . Vertigo     Past Surgical History:  Procedure Laterality Date  . AGILE CAPSULE N/A 06/21/2014   Procedure: AGILE CAPSULE;  Surgeon: Danie Binder, MD;  Location[facility];  Service: Endoscopy;  Laterality: N/A;   0700  . BIOPSY  10/13/2011   SLF: mild gastritis/duodenal polypoid lesion   . CEREBRAL ANEURYSM REPAIR  1997  . CHOLECYSTECTOMY N/A 10/28/2018   Procedure: LAPAROSCOPIC CHOLECYSTECTOMY WITH INTRAOPERATIVE CHOLANGIOGRAM;  Surgeon: Virl Cagey, MD;  Location: [facility] ORS;  Service: General;  Laterality: N/A;  . COLONOSCOPY  10/13/2011   DJM:EQASTMHD hemorrhoids/varices rectal/lesion in ascending colon(HYPERPLASTIC POLYP)  . COLONOSCOPY N/A 01/17/2013   SLF: 1. Internal hemorrhoids 2. Ascending colon polyps 3.  Rectal varices.   . COLONOSCOPY N/A 08/10/2016   Dr. Oneida Alar: redundant left colon. external and internal hemorhroids. 5-10 year surveillance  . ESOPHAGOGASTRODUODENOSCOPY N/A 06/29/2014   SLF: 1. anemia due to polypoid lesions in teh antrum, and duodenal polyps 2. single duodenum AVM  . ESOPHAGOGASTRODUODENOSCOPY (EGD) WITH PROPOFOL N/A 03/07/2019   Procedure: ESOPHAGOGASTRODUODENOSCOPY (EGD) WITH PROPOFOL;  Surgeon: Danie Binder, MD;  [facility];  Service: Endoscopy;  Laterality: N/A;  1:00pm  . FLEXIBLE SIGMOIDOSCOPY N/A 01/21/2016   Procedure: FLEXIBLE SIGMOIDOSCOPY;  Surgeon: Marga Melnick  Fields, MD;  [facility];  Service: Endoscopy;  Laterality: N/A;  245  . GIVENS CAPSULE STUDY N/A 06/29/2014   Procedure: GIVENS CAPSULE STUDY;  Surgeon: Danie Binder, MD;  [facility];  Service: Endoscopy;  Laterality: N/A;  . GIVENS CAPSULE STUDY N/A 07/13/2014   Procedure: GIVENS CAPSULE STUDY;  Surgeon: Danie Binder, MD;  [facility];  Service: Endoscopy;  Laterality: N/A;  700  . HEMORRHOID BANDING N/A 01/21/2016   Procedure: HEMORRHOID BANDING;  Surgeon: Danie Binder, MD;  [facility];  Service: Endoscopy;  Laterality: N/A;  . INGUINAL HERNIA REPAIR Right 08/28/2013   Procedure: RIGHT NGUINAL HERNIORRHAPHY WITH MESH;  Surgeon: Jamesetta So, MD;  Location: [facility] ORS;  Service: General;  Laterality: Right;  . INSERTION OF MESH N/A 06/09/2013     Procedure: INSERTION OF MESH;  Surgeon: Jamesetta So, MD;  Location: [facility];  Service: General;  Laterality: N/A;  . INSERTION OF MESH Right 08/28/2013   Procedure: INSERTION OF MESH;  Surgeon: Jamesetta So, MD;  Location: [facility];  Service: General;  Laterality: Right;  . PARACENTESIS  Feb 2014   10.7 liters  . TOOTH EXTRACTION Left 12/12/14  . UMBILICAL HERNIA REPAIR N/A 06/09/2013   Procedure: UMBILICAL HERNIORRHAPHY WITH MESH;  Surgeon: Jamesetta So, MD;  Location: [facility];  Service: General;  Laterality: N/A;    Prior to Admission medications   Medication Sig Start Date End Date Taking? Authorizing Provider  aspirin 325 MG tablet Take 1 tablet (325 mg total) by mouth daily. Patient taking differently: Take 325 mg by mouth every morning.  10/31/18  Yes Virl Cagey, MD  atorvastatin (LIPITOR) 40 MG tablet TAKE (1) TABLET BY MOUTH ONCE DAILY. Patient taking differently: Take 40 mg by mouth daily.  11/21/18  Yes Mikey Kirschner, MD  bumetanide (BUMEX) 1 MG tablet TAKE (1) TABLET BY MOUTH TWICE DAILY. Patient taking differently: Take 1 mg by mouth 2 (two) times daily.  12/13/18  Yes Carlis Stable, NP  buPROPion (WELLBUTRIN SR) 100 MG 12 hr tablet TAKE (1) TABLET BY MOUTH TWICE DAILY. Patient taking differently: Take 100 mg by mouth 2 (two) times daily.  10/17/18  Yes Mikey Kirschner, MD  Camphor-Menthol-Methyl Sal (SALONPAS) 3.04-04-08 % PTCH Place 1 patch onto the skin daily as needed (pain.).   Yes [provider]  cholecalciferol (VITAMIN D3) 25 MCG (1000 UT) tablet Take 2 tablets (2,000 Units total) by mouth daily. Patient taking differently: Take 1,000 Units by mouth every morning.  12/14/18  Yes Fields, Sandi L, MD  docusate sodium (COLACE) 100 MG capsule Take 100-200 mg by mouth 2 (two) times daily as needed (constipation.).    Yes [provider]  escitalopram (LEXAPRO) 20 MG tablet TAKE (1) TABLET BY MOUTH AT BEDTIME. Patient taking differently: Take 20 mg by  mouth at bedtime.  10/17/18  Yes Mikey Kirschner, MD  famotidine (PEPCID) 20 MG tablet Take 20 mg by mouth daily.   Yes [provider]  fluticasone (FLONASE) 50 MCG/ACT nasal spray Place 1 spray into both nostrils See admin instructions. Instill 1 spray into each nostril every morning, may also use later in the day as needed for sinus congestion   Yes [provider]  Lifitegrast (XIIDRA) 5 % SOLN Place 1 drop into both eyes 2 (two) times daily.   Yes [provider]  linaclotide (LINZESS) 145 MCG CAPS capsule Take 145 mcg  by mouth daily before breakfast.   Yes [provider]  loratadine (CLARITIN) 10 MG tablet Take 10 mg by mouth daily.   Yes [provider]  losartan (COZAAR) 25 MG tablet TAKE (1) TABLET BY MOUTH ONCE DAILY. Patient taking differently: Take 25 mg by mouth daily.  10/17/18  Yes Mikey Kirschner, MD  Multiple Vitamin (MULTIVITAMIN WITH MINERALS) TABS tablet Take 1 tablet by mouth daily.   Yes [provider]  Polyvinyl Alcohol-Povidone (REFRESH OP) Place 1 drop into both eyes 3 (three) times daily as needed (dry eyes).   Yes [provider]  Simethicone (GAS-X PO) Take 1 tablet by mouth 2 (two) times daily as needed (bloating/flatulence).   Yes [provider]  sodium chloride (OCEAN) 0.65 % nasal spray Place 1 spray into the nose 2 (two) times daily as needed for congestion.    Yes [provider]  spironolactone (ALDACTONE) 100 MG tablet TAKE (1) TABLET BY MOUTH TWICE DAILY. Patient taking differently: Take 50 mg by mouth 2 (two) times daily.  10/18/18  Yes Mahala Menghini, PA-C  Tofacitinib Citrate ER (XELJANZ XR) 11 MG TB24 Take 11 mg by mouth daily.    Yes [provider]    Current Facility-Administered Medications  Medication Dose Route Frequency Provider Last Rate Last Admin  . acetaminophen (TYLENOL) tablet 650 mg  650 mg Oral Q6H PRN Jani Gravel, MD   650 mg at 03/14/19 2255   Or    . acetaminophen (TYLENOL) suppository 650 mg  650 mg Rectal Q6H PRN Jani Gravel, MD      . artificial tears (LACRILUBE) ophthalmic ointment   Both Eyes Q4H PRN Jani Gravel, MD      . atorvastatin (LIPITOR) tablet 40 mg  40 mg Oral Daily Jani Gravel, MD      . bumetanide Cleda Clarks) tablet 1 mg  1 mg Oral BID Jani Gravel, MD      . buPROPion [facility] SR) 12 hr tablet 100 mg  100 mg Oral BID Jani Gravel, MD   100 mg at 03/14/19 2256  . Camphor-Menthol-Methyl Sal 3.04-04-08 % PTCH 1 patch  1 patch Transdermal Daily PRN Jani Gravel, MD      . cholecalciferol (VITAMIN D3) tablet 1,000 Units  1,000 Units Oral QPM Jani Gravel, MD   1,000 Units at 03/14/19 2255  . docusate sodium (COLACE) capsule 100-200 mg  100-200 mg Oral BID PRN Jani Gravel, MD      . escitalopram (LEXAPRO) tablet 20 mg  20 mg Oral Loma Sousa, MD   20 mg at 03/14/19 2255  . famotidine (PEPCID) IVPB 20 mg premix  20 mg Intravenous Marjean Donna, MD 100 mL/hr at 03/14/19 2301 20 mg at 03/14/19 2301  . fluticasone (FLONASE) 50 MCG/ACT nasal spray 1 spray  1 spray Each Nare Daily PRN Jani Gravel, MD      . Lifitegrast 5 % SOLN 1 drop  1 drop Both Eyes BID Jani Gravel, MD      . linaclotide Rolan Lipa) capsule 145 mcg  145 mcg Oral QAC breakfast Jani Gravel, MD      . loratadine (CLARITIN) tablet 10 mg  10 mg Oral Daily Jani Gravel, MD   10 mg at 03/14/19 2255  . losartan (COZAAR) tablet 25 mg  25 mg Oral Daily Jani Gravel, MD      . multivitamin with minerals tablet 1 tablet  1 tablet Oral Daily Jani Gravel, MD   1 tablet at 03/14/19 2254  .  simethicone (MYLICON) chewable tablet 80 mg  80 mg Oral BID PRN Jani Gravel, MD   80 mg at 03/14/19 2254  . sodium chloride (OCEAN) 0.65 % nasal spray 1 spray  1 spray Nasal BID PRN Jani Gravel, MD      . spironolactone (ALDACTONE) tablet 50 mg  50 mg Oral BID Jani Gravel, MD   50 mg at 03/14/19 2254    Allergies as of 03/14/2019 - Review Complete 03/14/2019  Allergen Reaction Noted  . Omeprazole Shortness Of  Breath 08/19/2011  . Orencia [abatacept] Anaphylaxis, Hives, and Rash 10/25/2017  . Other Shortness Of Breath and Rash 01/09/2013  . Phenytoin Rash   . Keflex [cephalexin] Other (See Comments)   . Phenergan [promethazine hcl] Other (See Comments) 04/08/2012    Family History  Problem Relation Age of Onset  . Cirrhosis Mother        nash  . COPD Father   . Heart disease Father   . Asthma Other   . Heart disease Other   . Colon cancer Neg Hx   . Inflammatory bowel disease Neg Hx     Social History   Socioeconomic History  . Marital status: Married    Spouse name: Kris Mouton  . Number of children: 0  . Years of education: Not on file  . Highest education level: Not on file  Occupational History    Employer: RETIRED  Tobacco Use  . Smoking status: Former Smoker    Packs/day: 2.00    Years: 12.00    Pack years: 24.00    Types: Cigarettes    Quit date: 03/31/1995    Years since quitting: 23.9  . Smokeless tobacco: Never Used  . Tobacco comment: quit in 1997  Substance and Sexual Activity  . Alcohol use: No    Comment: Bottle of wine a day for years, but quit in 07/2011  . Drug use: No  . Sexual activity: Not Currently    Birth control/protection: Post-menopausal  Other Topics Concern  . Not on file  Social History Narrative   Lives   Caffeine use:    Quit drinking alcohol 3 YEARS ago. No drugs, no IVDU.    Social Determinants of Health   Financial Resource Strain:   . Difficulty of Paying Living Expenses: Not on file  Food Insecurity:   . Worried About Charity fundraiser in the Last Year: Not on file  . Ran Out of Food in the Last Year: Not on file  Transportation Needs:   . Lack of Transportation (Medical): Not on file  . Lack of Transportation (Non-Medical): Not on file  Physical Activity:   . Days of Exercise per Week: Not on file  . Minutes of Exercise per Session: Not on file  Stress:   . Feeling of Stress : Not on file  Social Connections:   .  Frequency of Communication with Friends and Family: Not on file  . Frequency of Social Gatherings with Friends and Family: Not on file  . Attends Religious Services: Not on file  . Active Member of Clubs or Organizations: Not on file  . Attends Archivist Meetings: Not on file  . Marital Status: Not on file  Intimate Partner Violence:   . Fear of Current or Ex-Partner: Not on file  . Emotionally Abused: Not on file  . Physically Abused: Not on file  . Sexually Abused: Not on file    Review of Systems: Gen: + Is hot and chills, no  fever or sweats..  CV: Denies chest pain, palpitations or edema. Resp: Denies cough, shortness of breath of hemoptysis.  GI: See HPI.   GU : Denies urinary burning, blood in urine, increased urinary frequency or incontinence. MS: Denies joint pain, muscles aches or weakness. Derm: Denies rash, itchiness, skin lesions or unhealing ulcers. Psych: Denies depression, anxiety, memory loss, suicidal ideation and confusion. Heme: Denies bruising, bleeding. Neuro:  Denies headaches, dizziness or paresthesias. Endo:  Denies any problems with DM, thyroid or adrenal function.  Physical Exam: Vital signs in last 24 hours: Temp:  [98 F (36.7 C)-98.6 F (37 C)] 98 F (36.7 C) (12/16 0503) Pulse Rate:  [65-93] 65 (12/16 0503) Resp:  [18-20] 19 (12/16 0503) BP: (109-127)/(55-81) 120/55 (12/16 0503) SpO2:  [98 %-100 %] 100 % (12/16 0503) Weight:  [109.3 kg-110.3 kg] 110.3 kg (12/16 0500) Last BM Date: 03/14/19 General:   Alert,  well-developed, well-nourished, pleasant and cooperative in NAD. Head:  Normocephalic and atraumatic. Eyes:  Sclera clear, no icterus. Conjunctiva pink. Ears:  Normal auditory acuity. Nose:  No deformity, discharge or lesions. Mouth:  No deformity or lesions.   Neck:  Supple. Lungs: Breath sounds clear throughout. Heart: Regular rate and rhythm, no murmurs. Abdomen: Soft, nontender, no masses or organomegaly.  Positive bowel  sounds to all 4 quadrants. Rectal:  Deferred, FOBT guaiac positive. Msk:  Symmetrical without gross deformities. . Pulses:  Normal pulses noted. Extremities:  Without clubbing or edema. Neurologic:  Alert and  oriented x4;  grossly normal neurologically. Skin:  Intact without significant lesions or rashes.. Psych:  Alert and cooperative. Normal mood and affect.  Intake/Output from previous day: 12/15 0701 - 12/16 0700 In: 1482.3 [I.V.:433.3; IV Piggyback:1049] Out: -  Intake/Output this shift: No intake/output data recorded.  Lab Results: Recent Labs    03/14/19 1552 03/15/19 0445  WBC 7.0 6.6  HGB 8.8* 7.7*  HCT 27.7* 24.4*  PLT 228 187   BMET Recent Labs    03/14/19 1552 03/15/19 0445  NA 138 140  K 3.5 3.7  CL 103 109  CO2 23 24  GLUCOSE 106* 100*  BUN 34* 28*  CREATININE 1.14* 1.16*  CALCIUM 9.1 8.6*   LFT Recent Labs    03/15/19 0445  PROT 5.6*  ALBUMIN 3.4*  AST 20  ALT 20  ALKPHOS 50  BILITOT 0.5   PT/INR Recent Labs    03/14/19 1552  LABPROT 14.8  INR 1.2   Hepatitis Panel No results for input(s): HEPBSAG, HCVAB, HEPAIGM, HEPBIGM in the last 72 hours.  Studies/Results: No results found.  IMPRESSION/PLAN:  83.  63 year old female with a complex past medical history including iron deficiency anemia, status post EGD 03/07/2019 identified a small bleeding gastric AVM which was cauterized.  She presented to [facility] ED with upper GI bleed/melena and acute anemia.  Admission hemoglobin 8.8 down to 7.7 today.  BUN 34 down to 28.  She passed a small black loose stool yesterday evening.  No BM yet today.  She is hemodynamically stable.  -Remain n.p.o. for now -IV fluids per the hospitalist -Repeat H&H and iron panel at 2 PM today -Transfuse if hemoglobin less than 7 -Plan for EGD later today or most likely tomorrow as her COVID-19 test is still pending.  -Stop Linzess -Continue Famotidine 20 mg IV twice daily as she is allergic to  omeprazole  2.  Alcoholic cirrhosis without ascites. No evidence of esophageal varices on EGD 08/05/2018.  No evidence of hepatic encephalopathy.  3.  Chronic kidney disease stage III  Further recommendations per Dr. Merrily Pew Dorathy Daft  03/15/2019, 8:28 AM

## 2019-03-15 NOTE — Progress Notes (Signed)
Patient says she is still passing tarry stools. She is hungry Hemoglobin is up to 8.4 g.  It was 7.7 g this morning. Covid test negative.  We will schedule patient for esophagogastroduodenoscopy under monitored anesthesia care with Dr. Barney Drain, patient's primary gastroenterologist. I also discussed the possibility of doing a small bowel given capsule study if EGD is negative unless Dr. Oneida Alar has other recommendations. Patient is agreeable.

## 2019-03-15 NOTE — Progress Notes (Signed)
Patient Demographics:    Deanna Thompson, is a 63 y.o. female, DOB - [birth year], [id number]  Admit date - 03/14/2019   Admitting Physician Malikiah Debarr Denton Brick, MD  Outpatient Primary MD for the patient is Luking, Grace Bushy, MD  LOS - 0   Chief Complaint  Patient presents with  . Rectal Bleeding        Subjective:    [facility] today has no fevers, no emesis,  No chest pain,   No nausea, vomiting, diarrhea  No fever  Or chills  -Had black tarry stool yesterday but not today  Assessment  & Plan :    Principal Problem:   GI bleeding Active Problems:   Cirrhosis (HCC)   Depression   Psoriatic arthritis (HCC)   Symptomatic anemia  Brief Summary:- 63 y.o. female,  w Depression, Seizures, Hypertension, CKD stage 3, Cardiomyopathy (EF 45-50%), w mild aortic stenosis, mild mitral regurgitation,  h/o TIA, h/o Brain aneurysm, w metal coil and clip (No MRI please),  B12 deficiency, Psoriatic arthritis, Sjogrens disease,  Iron deficiency anemia, prior colonoscopy [id number], Alcoholic Cirrhosis,  w recent EGD 03/07/19-> esophagitis, angiodysplasia of stomach, admitted on 03/14/2019 with an episode of GI bleed after presenting with with c/o nausea, dizziness, rectal bleeding, black and tarry stools   A/p 1)Acute on chronic iron deficiency anemia with history of known gastric AVMs ------Hemoglobin is up to 8.4 g.  It was 7.7 g this morning. Covid test negative. -Patient apparently is intolerant to PPI, treat with IV Pepcid -Plan is for EGD under monitored anesthesia with Dr. Oneida Alar on 03/16/2019 --- Patient may need  small bowel given capsule study if EGD is negative  Patient is agreeable. -Patient had a GI capsule on 07/13/2018 2916 that showed active GI bleeding the distal jejunum or proximal ileum [facility]) -Serum iron is 36, TIBC 412, iron saturation 9 -Ferritin is borderline low at  12  2)CKD III--- renal function is stable with a creatinine of 1.16 which is around patient's baseline, renally adjust medications, avoid nephrotoxic agents / dehydration / hypotension  3)Alcoholic liver cirrhosis--clinically no evidence of encephalopathy, EGD on 08/05/2018 did not show any esophageal varices -Continue Bumex and Aldactone  3)HFrEF/ cardiomyopathy--- last known EF 45 to 50%, stable, no acute exacerbation, continue losartan 25 mg daily continue Bumex and Aldactone  4)History of brain aneurysm status post metallic coil and clip----clip" and not MRI compatible--- patient is currently asymptomatic  5) history of Sjogren's disease psoriatic arthritis--  6) history of prior TIA--hold aspirin, continue Lipitor  7)Depression anxiety disorder--stable, continue Wellbutrin and Lexapro  Disposition/Need for in-Hospital Stay- patient unable to be discharged at this time due to --recurrent GI bleed requiring endoluminal evaluation and possibly transfusion*  Code Status : Full  Family Communication:   NA (patient is alert, awake and coherent)   Disposition Plan  : home  Consults  :  Gi  DVT Prophylaxis  :    - SCDs   Lab Results  Component Value Date   PLT 187 03/15/2019    Inpatient Medications  Scheduled Meds: . atorvastatin  40 mg Oral Daily  . bumetanide  1 mg Oral BID  . buPROPion  100 mg Oral BID  . cholecalciferol  1,000 Units Oral QPM  .  escitalopram  20 mg Oral QHS  . Lifitegrast  1 drop Both Eyes BID  . loratadine  10 mg Oral Daily  . losartan  25 mg Oral Daily  . multivitamin with minerals  1 tablet Oral Daily  . spironolactone  50 mg Oral BID   Continuous Infusions: . famotidine (PEPCID) IV 20 mg (03/15/19 0900)   PRN Meds:.acetaminophen **OR** acetaminophen, artificial tears, docusate sodium, fluticasone, simethicone, sodium chloride   Anti-infectives (From admission, onward)   None        Objective:   Vitals:   03/15/19 0500 03/15/19 0503  03/15/19 1338 03/15/19 1511  BP:  (!) 120/55 (!) 102/59   Pulse:  65 72   Resp:  19 17   Temp:  98 F (36.7 C) 98.3 F (36.8 C)   TempSrc:  Oral    SpO2:  100% 100% 98%  Weight: 110.3 kg     Height:        Wt Readings from Last 3 Encounters:  03/15/19 110.3 kg  03/06/19 111.1 kg  02/20/19 111.5 kg     Intake/Output Summary (Last 24 hours) at 03/15/2019 1906 Last data filed at 03/15/2019 1700 Gross per 24 hour  Intake 1203.33 ml  Output --  Net 1203.33 ml     Physical Exam  Gen:- Awake Alert,  In no apparent distress  HEENT:- [place].AT, No sclera icterus Neck-Supple Neck,No JVD,.  Lungs-  CTAB , fair symmetrical air movement CV- S1, S2 normal, regular  Abd-  +ve B.Sounds, Abd Soft, No tenderness,    Extremity/Skin:- No  edema, pedal pulses present  Psych-affect is appropriate, oriented x3 Neuro-no new focal deficits, no tremors   Data Review:   Micro Results Recent Results (from the past 240 hour(s))  SARS CORONAVIRUS 2 (TAT 6-24 HRS) Nasopharyngeal Nasopharyngeal Swab     Status: None   Collection Time: 03/06/19  7:18 AM   Specimen: Nasopharyngeal Swab  Result Value Ref Range Status   SARS Coronavirus 2 NEGATIVE NEGATIVE Final    Comment: (NOTE) SARS-CoV-2 target nucleic acids are NOT DETECTED. The SARS-CoV-2 RNA is generally detectable in upper and lower respiratory specimens during the acute phase of infection. Negative results do not preclude SARS-CoV-2 infection, do not rule out co-infections with other pathogens, and should not be used as the sole basis for treatment or other patient management decisions. Negative results must be combined with clinical observations, patient history, and epidemiological information. The expected result is Negative. Fact Sheet for Patients: SugarRoll.be Fact Sheet for Healthcare Providers: https://www.woods-mathews.com/ This test is not yet approved or cleared by the [place]  FDA and  has been authorized for detection and/or diagnosis of SARS-CoV-2 by FDA under an Emergency Use Authorization (EUA). This EUA will remain  in effect (meaning this test can be used) for the duration of the COVID-19 declaration under Section 56 4(b)(1) of the Act, 21 U.S.C. section 360bbb-3(b)(1), unless the authorization is terminated or revoked sooner. Performed at [facility], [place] [address], [place], [place] [address]   SARS CORONAVIRUS 2 (TAT 6-24 HRS) Nasopharyngeal Nasopharyngeal Swab     Status: None   Collection Time: 03/14/19  7:41 PM   Specimen: Nasopharyngeal Swab  Result Value Ref Range Status   SARS Coronavirus 2 NEGATIVE NEGATIVE Final    Comment: (NOTE) SARS-CoV-2 target nucleic acids are NOT DETECTED. The SARS-CoV-2 RNA is generally detectable in upper and lower respiratory specimens during the acute phase of infection. Negative results do not preclude SARS-CoV-2 infection,  do not rule out co-infections with other pathogens, and should not be used as the sole basis for treatment or other patient management decisions. Negative results must be combined with clinical observations, patient history, and epidemiological information. The expected result is Negative. Fact Sheet for Patients: SugarRoll.be Fact Sheet for Healthcare Providers: https://www.woods-mathews.com/ This test is not yet approved or cleared by the [place] FDA and  has been authorized for detection and/or diagnosis of SARS-CoV-2 by FDA under an Emergency Use Authorization (EUA). This EUA will remain  in effect (meaning this test can be used) for the duration of the COVID-19 declaration under Section 56 4(b)(1) of the Act, 21 U.S.C. section 360bbb-3(b)(1), unless the authorization is terminated or revoked sooner. Performed at [facility], [place] [address], [place], [place] [address]     Radiology Reports No results found.    CBC Recent Labs  Lab 03/14/19 1552 03/15/19 0445 03/15/19 1402  WBC 7.0 6.6  --   HGB 8.8* 7.7* 8.4*  HCT 27.7* 24.4* 26.4*  PLT 228 187  --   MCV 92.0 93.1  --   MCH 29.2 29.4  --   MCHC 31.8 31.6  --   RDW 17.1* 17.1*  --   LYMPHSABS 1.1  --   --   MONOABS 0.5  --   --   EOSABS 0.1  --   --   BASOSABS 0.0  --   --     Chemistries  Recent Labs  Lab 03/14/19 1552 03/15/19 0445  NA 138 140  K 3.5 3.7  CL 103 109  CO2 23 24  GLUCOSE 106* 100*  BUN 34* 28*  CREATININE 1.14* 1.16*  CALCIUM 9.1 8.6*  AST 20 20  ALT 20 20  ALKPHOS 56 50  BILITOT 0.3 0.5   ------------------------------------------------------------------------------------------------------------------ No results for input(s): CHOL, HDL, LDLCALC, TRIG, CHOLHDL, LDLDIRECT in the last 72 hours.  Lab Results  Component Value Date   HGBA1C 5.8 (H) 08/01/2017   ------------------------------------------------------------------------------------------------------------------ No results for input(s): TSH, T4TOTAL, T3FREE, THYROIDAB in the last 72 hours.  Invalid input(s): FREET3 ------------------------------------------------------------------------------------------------------------------ Recent Labs    03/15/19 1402  FERRITIN 12  TIBC 412  IRON 36    Coagulation profile Recent Labs  Lab 03/14/19 1552  INR 1.2    No results for input(s): DDIMER in the last 72 hours.  Cardiac Enzymes No results for input(s): CKMB, TROPONINI, MYOGLOBIN in the last 168 hours.  Invalid input(s): CK ------------------------------------------------------------------------------------------------------------------ No results found for: BNP   Roxan Hockey M.D on 03/15/2019 at 7:06 PM  Go to [email or web address] - for contact info  [facility] - Office  [phone]

## 2019-03-16 ENCOUNTER — Inpatient Hospital Stay (HOSPITAL_COMMUNITY): Payer: Commercial Managed Care - PPO | Admitting: Anesthesiology

## 2019-03-16 ENCOUNTER — Other Ambulatory Visit: Payer: Self-pay

## 2019-03-16 ENCOUNTER — Inpatient Hospital Stay (HOSPITAL_COMMUNITY): Payer: Commercial Managed Care - PPO

## 2019-03-16 ENCOUNTER — Encounter (HOSPITAL_COMMUNITY)
Admission: EM | Disposition: A | Discharge status: Home / Self Care | Payer: Self-pay | Source: Home / Self Care | Attending: Family Medicine

## 2019-03-16 DIAGNOSIS — K31811 Angiodysplasia of stomach and duodenum with bleeding: Principal | ICD-10-CM

## 2019-03-16 HISTORY — PX: HEMOSTASIS CLIP PLACEMENT: SHX6857

## 2019-03-16 HISTORY — PX: HEMOSTASIS CONTROL: SHX6838

## 2019-03-16 HISTORY — PX: ESOPHAGOGASTRODUODENOSCOPY (EGD) WITH PROPOFOL: SHX5813

## 2019-03-16 LAB — POCT I-STAT, CHEM 8
BUN: 14 mg/dL (ref 8–23)
Calcium, Ion: 1.14 mmol/L — ABNORMAL LOW (ref 1.15–1.40)
Chloride: 105 mmol/L (ref 98–111)
Creatinine, Ser: 1.1 mg/dL — ABNORMAL HIGH (ref 0.44–1.00)
Glucose, Bld: 103 mg/dL — ABNORMAL HIGH (ref 70–99)
HCT: 25 % — ABNORMAL LOW (ref 36.0–46.0)
Hemoglobin: 8.5 g/dL — ABNORMAL LOW (ref 12.0–15.0)
Potassium: 3.7 mmol/L (ref 3.5–5.1)
Sodium: 139 mmol/L (ref 135–145)
TCO2: 21 mmol/L — ABNORMAL LOW (ref 22–32)

## 2019-03-16 LAB — BASIC METABOLIC PANEL
Anion gap: 10 (ref 5–15)
BUN: 18 mg/dL (ref 8–23)
CO2: 26 mmol/L (ref 22–32)
Calcium: 8.7 mg/dL — ABNORMAL LOW (ref 8.9–10.3)
Chloride: 104 mmol/L (ref 98–111)
Creatinine, Ser: 1.2 mg/dL — ABNORMAL HIGH (ref 0.44–1.00)
GFR calc Af Amer: 56 mL/min — ABNORMAL LOW (ref >60)
GFR calc non Af Amer: 48 mL/min — ABNORMAL LOW (ref >60)
Glucose, Bld: 108 mg/dL — ABNORMAL HIGH (ref 70–99)
Potassium: 3.2 mmol/L — ABNORMAL LOW (ref 3.5–5.1)
Sodium: 140 mmol/L (ref 135–145)

## 2019-03-16 LAB — HEPATIC FUNCTION PANEL
ALT: 25 U/L (ref 0–44)
AST: 30 U/L (ref 15–41)
Albumin: 3.8 g/dL (ref 3.5–5.0)
Alkaline Phosphatase: 55 U/L (ref 38–126)
Bilirubin, Direct: 0.1 mg/dL (ref 0.0–0.2)
Indirect Bilirubin: 0.6 mg/dL (ref 0.3–0.9)
Total Bilirubin: 0.7 mg/dL (ref 0.3–1.2)
Total Protein: 6.5 g/dL (ref 6.5–8.1)

## 2019-03-16 LAB — CBC
HCT: 25.8 % — ABNORMAL LOW (ref 36.0–46.0)
Hemoglobin: 8.5 g/dL — ABNORMAL LOW (ref 12.0–15.0)
MCH: 30.6 pg (ref 26.0–34.0)
MCHC: 32.9 g/dL (ref 30.0–36.0)
MCV: 92.8 fL (ref 80.0–100.0)
Platelets: 184 10*3/uL (ref 150–400)
RBC: 2.78 MIL/uL — ABNORMAL LOW (ref 3.87–5.11)
RDW: 17 % — ABNORMAL HIGH (ref 11.5–15.5)
WBC: 4.7 10*3/uL (ref 4.0–10.5)
nRBC: 0 % (ref 0.0–0.2)

## 2019-03-16 LAB — LIPASE, BLOOD: Lipase: 33 U/L (ref 11–51)

## 2019-03-16 SURGERY — ESOPHAGOGASTRODUODENOSCOPY (EGD) WITH PROPOFOL
Anesthesia: General

## 2019-03-16 MED ORDER — FENTANYL CITRATE (PF) 100 MCG/2ML IJ SOLN
25.0000 ug | INTRAMUSCULAR | Status: DC | PRN
  Administered 2019-03-16 (×4): 25 ug via INTRAVENOUS

## 2019-03-16 MED ORDER — HYDROCODONE-ACETAMINOPHEN 7.5-325 MG PO TABS: 1.0000 | ORAL_TABLET | Freq: Once | ORAL | Status: DC | PRN

## 2019-03-16 MED ORDER — ONDANSETRON HCL 4 MG/2ML IJ SOLN
INTRAMUSCULAR | Status: AC
  Filled 2019-03-16: qty 2

## 2019-03-16 MED ORDER — LIDOCAINE VISCOUS HCL 2 % MT SOLN: 5.0000 mL | Freq: Once | OROMUCOSAL | Status: DC

## 2019-03-16 MED ORDER — KETAMINE HCL 10 MG/ML IJ SOLN
INTRAMUSCULAR | Status: DC | PRN
  Administered 2019-03-16: 30 mg via INTRAVENOUS

## 2019-03-16 MED ORDER — ONDANSETRON HCL 4 MG/2ML IJ SOLN: 4.0000 mg | Freq: Once | INTRAMUSCULAR | Status: DC

## 2019-03-16 MED ORDER — SIMETHICONE 80 MG PO CHEW
80.0000 mg | CHEWABLE_TABLET | Freq: Three times a day (TID) | ORAL | Status: DC
  Administered 2019-03-16 – 2019-03-17 (×3): 80 mg via ORAL
  Filled 2019-03-16 (×3): qty 1

## 2019-03-16 MED ORDER — FENTANYL CITRATE (PF) 100 MCG/2ML IJ SOLN
INTRAMUSCULAR | Status: AC
  Filled 2019-03-16: qty 2

## 2019-03-16 MED ORDER — ONDANSETRON HCL 4 MG/2ML IJ SOLN
4.0000 mg | Freq: Once | INTRAMUSCULAR | Status: AC
  Administered 2019-03-16: 4 mg via INTRAVENOUS

## 2019-03-16 MED ORDER — HYDROMORPHONE HCL 1 MG/ML IJ SOLN: 0.5000 mg | Freq: Four times a day (QID) | INTRAMUSCULAR | Status: DC

## 2019-03-16 MED ORDER — HYDROMORPHONE HCL 1 MG/ML IJ SOLN
0.5000 mg | Freq: Four times a day (QID) | INTRAMUSCULAR | Status: DC
  Administered 2019-03-16: 0.5 mg via INTRAVENOUS
  Filled 2019-03-16 (×2): qty 0.5

## 2019-03-16 MED ORDER — ONDANSETRON HCL 4 MG/2ML IJ SOLN
INTRAMUSCULAR | Status: DC | PRN
  Administered 2019-03-16: 4 mg via INTRAVENOUS

## 2019-03-16 MED ORDER — EPINEPHRINE 1 MG/10ML IJ SOSY
PREFILLED_SYRINGE | INTRAMUSCULAR | Status: AC
  Filled 2019-03-16: qty 10

## 2019-03-16 MED ORDER — FAMOTIDINE 20 MG PO TABS
20.0000 mg | ORAL_TABLET | Freq: Two times a day (BID) | ORAL | Status: DC
  Administered 2019-03-16 – 2019-03-17 (×2): 20 mg via ORAL
  Filled 2019-03-16 (×2): qty 1

## 2019-03-16 MED ORDER — NITROGLYCERIN 2 % TD OINT
TOPICAL_OINTMENT | TRANSDERMAL | Status: AC
  Filled 2019-03-16: qty 1

## 2019-03-16 MED ORDER — LACTATED RINGERS IV SOLN: INTRAVENOUS | Status: DC

## 2019-03-16 MED ORDER — PROPOFOL 10 MG/ML IV BOLUS
INTRAVENOUS | Status: AC
  Filled 2019-03-16: qty 60

## 2019-03-16 MED ORDER — HYDROMORPHONE HCL 1 MG/ML IJ SOLN
0.2500 mg | INTRAMUSCULAR | Status: DC | PRN
  Administered 2019-03-16 (×2): 0.5 mg via INTRAVENOUS
  Filled 2019-03-16 (×2): qty 0.5

## 2019-03-16 MED ORDER — POTASSIUM CHLORIDE CRYS ER 20 MEQ PO TBCR
40.0000 meq | EXTENDED_RELEASE_TABLET | ORAL | Status: AC
  Administered 2019-03-16 (×2): 40 meq via ORAL
  Filled 2019-03-16 (×2): qty 2

## 2019-03-16 MED ORDER — ONDANSETRON HCL 4 MG/2ML IJ SOLN
4.0000 mg | Freq: Three times a day (TID) | INTRAMUSCULAR | Status: DC
  Administered 2019-03-17: 4 mg via INTRAVENOUS
  Filled 2019-03-16 (×2): qty 2

## 2019-03-16 MED ORDER — PROPOFOL 500 MG/50ML IV EMUL
INTRAVENOUS | Status: DC | PRN
  Administered 2019-03-16: 150 ug/kg/min via INTRAVENOUS

## 2019-03-16 MED ORDER — KETAMINE HCL 50 MG/5ML IJ SOSY
PREFILLED_SYRINGE | INTRAMUSCULAR | Status: AC
  Filled 2019-03-16: qty 5

## 2019-03-16 MED ORDER — HYDROMORPHONE HCL 1 MG/ML IJ SOLN: 1.0000 mg | INTRAMUSCULAR | Status: DC | PRN

## 2019-03-16 MED ORDER — PROPOFOL 10 MG/ML IV BOLUS
INTRAVENOUS | Status: AC
  Filled 2019-03-16: qty 40

## 2019-03-16 MED ORDER — SODIUM CHLORIDE (PF) 0.9 % IJ SOLN
PREFILLED_SYRINGE | INTRAMUSCULAR | Status: DC | PRN
  Administered 2019-03-16: 8 mL

## 2019-03-16 MED ORDER — MIDAZOLAM HCL 2 MG/2ML IJ SOLN: 0.5000 mg | Freq: Once | INTRAMUSCULAR | Status: DC | PRN

## 2019-03-16 NOTE — Anesthesia Postprocedure Evaluation (Signed)
Anesthesia Post Note  Patient: [facility]  Procedure(s) Performed: ESOPHAGOGASTRODUODENOSCOPY (EGD) WITH PROPOFOL (N/A ) GIVENS CAPSULE STUDY (N/A )  Patient location during evaluation: PACU Anesthesia Type: MAC Level of consciousness: awake, awake and alert and oriented Pain management: pain level controlled Vital Signs Assessment: post-procedure vital signs reviewed and stable Respiratory status: spontaneous breathing, respiratory function stable and nonlabored ventilation Cardiovascular status: stable : patient nauseous, zofran given. Anesthetic complications: no     Last Vitals:  Vitals:   03/16/19 0600 03/16/19 1326  BP: 123/61 126/61  Pulse: 65 70  Resp: 17 18  Temp: 36.6 C 37 C  SpO2: 100% 100%    Last Pain:  Vitals:   03/16/19 1326  TempSrc: Oral  PainSc: 0-No pain                 [name]

## 2019-03-16 NOTE — Anesthesia Preprocedure Evaluation (Signed)
Anesthesia Evaluation  Patient identified by MRN, date of birth, ID band Patient awake    Reviewed: Allergy & Precautions, NPO status , Patient's Chart, lab work & pertinent test results  History of Anesthesia Complications (+) PONV and history of anesthetic complications  Airway Mallampati: I  TM Distance: >3 FB Neck ROM: Full    Dental no notable dental hx. (+) Teeth Intact   Pulmonary neg pulmonary ROS, former smoker,    Pulmonary exam normal breath sounds clear to auscultation       Cardiovascular Exercise Tolerance: Good hypertension, negative cardio ROS Normal cardiovascular examI Rhythm:Regular Rate:Normal  Good ET H/o LBBB old  Latest EF ~45-50 - mild AS noted   Neuro/Psych Seizures -, Well Controlled,  Anxiety Depression TIACVA, No Residual Symptoms negative psych ROS   GI/Hepatic GERD  Medicated and Controlled,(+) Cirrhosis       , Quit Etoh 7 years ago   Endo/Other  negative endocrine ROS  Renal/GU   negative genitourinary   Musculoskeletal  (+) Arthritis , Osteoarthritis,    Abdominal   Peds negative pediatric ROS (+)  Hematology negative hematology ROS (+) anemia , Sever Anemia  No transfusion yet  hgb ~8.5/28.5   Anesthesia Other Findings H/o Brain aneurysm clipped and coiled latest intervention ~2010   Reproductive/Obstetrics negative OB ROS                             Anesthesia Physical Anesthesia Plan  ASA: III  Anesthesia Plan: General   Post-op Pain Management:    Induction: Intravenous  PONV Risk Score and Plan: TIVA, Propofol infusion, Ondansetron and Treatment may vary due to age or medical condition  Airway Management Planned: Nasal Cannula and Simple Face Mask  Additional Equipment:   Intra-op Plan:   Post-operative Plan:   Informed Consent: I have reviewed the patients History and Physical, chart, labs and discussed the procedure including  the risks, benefits and alternatives for the proposed anesthesia with the patient or authorized representative who has indicated his/her understanding and acceptance.     Dental advisory given  Plan Discussed with: CRNA  Anesthesia Plan Comments: (Plan Full PPE use  Plan GA with GETA as needed d/w pt -WTP with same after Q&A)        Anesthesia Quick Evaluation

## 2019-03-16 NOTE — H&P (Signed)
Primary Care Physician:  Mikey Kirschner, MD Primary Gastroenterologist:  Dr. Oneida Alar  Pre-Procedure History & Physical: HPI:  Deanna Thompson is a 63 y.o. female here for  [place].  Past Medical History:  Diagnosis Date  . Abdominal hernia    repaired  . Abscess of pulp of tooth 12/12/14   left side  . Anxiety   . Arthritis    back  and down flank  right side more than left  . B12 deficiency 06/12/2015  . Brain aneurysm    METAL COIL PREVENTS MRI IMAGING and CLIP  . Cirrhosis ([place])    Presumably alcoholic cirrhosis  . Complication of anesthesia   . Depression   . ETOH abuse    Quit in 07/2011   . Fatigue    due to Sjogren's disease  . Folate deficiency 06/30/2016  . GERD (gastroesophageal reflux disease)   . HTN (hypertension)    no longer medicated, dated on 04/25/12  . IBS (irritable bowel syndrome)   . Iron deficiency anemia due to chronic blood loss   . Myofascial pain   . Panic attack   . PONV (postoperative nausea and vomiting)   . Psoriatic arthritis ([place])   . Right inguinal hernia 08/16/2012  . Seizures ([place]) 1997   1 seizure from coil insertion but no more seizures and no meds for seizures  . Sjogren's disease ([place])   . [place] HEAD 02/03/2007   Qualifier: Diagnosis of  By: Aline Brochure MD, Dorothyann Peng    . TIA (transient ischemic attack)   . 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([place])   . Vertigo     Past Surgical History:  Procedure Laterality Date  . AGILE CAPSULE N/A 06/21/2014   Procedure: AGILE CAPSULE;  Surgeon: Danie Binder, MD;  [facility];  Service: Endoscopy;  Laterality: N/A;  0700  . BIOPSY  10/13/2011   SLF: mild gastritis/duodenal polypoid lesion   . CEREBRAL ANEURYSM REPAIR  1997  . CHOLECYSTECTOMY N/A 10/28/2018   Procedure: LAPAROSCOPIC CHOLECYSTECTOMY WITH INTRAOPERATIVE CHOLANGIOGRAM;  Surgeon: Virl Cagey, MD;  Location: [facility] ORS;  Service: General;  Laterality: N/A;  . COLONOSCOPY  10/13/2011   [id number]  hemorrhoids/varices rectal/lesion in ascending colon(HYPERPLASTIC POLYP)  . COLONOSCOPY N/A 01/17/2013   SLF: 1. Internal hemorrhoids 2. Ascending colon polyps 3.  Rectal varices.   . COLONOSCOPY N/A 08/10/2016   Dr. Oneida Alar: redundant left colon. external and internal hemorhroids. 5-10 year surveillance  . ESOPHAGOGASTRODUODENOSCOPY N/A 06/29/2014   SLF: 1. anemia due to polypoid lesions in teh antrum, and duodenal polyps 2. single duodenum AVM  . ESOPHAGOGASTRODUODENOSCOPY (EGD) WITH PROPOFOL N/A 03/07/2019   Procedure: ESOPHAGOGASTRODUODENOSCOPY (EGD) WITH PROPOFOL;  Surgeon: Danie Binder, MD;  [facility];  Service: Endoscopy;  Laterality: N/A;  1:00pm  . FLEXIBLE SIGMOIDOSCOPY N/A 01/21/2016   Procedure: FLEXIBLE SIGMOIDOSCOPY;  Surgeon: Danie Binder, MD;  [facility];  Service: Endoscopy;  Laterality: N/A;  245  . GIVENS CAPSULE STUDY N/A 06/29/2014   Procedure: GIVENS CAPSULE STUDY;  Surgeon: Danie Binder, MD;  [facility];  Service: Endoscopy;  Laterality: N/A;  . GIVENS CAPSULE STUDY N/A 07/13/2014   Procedure: GIVENS CAPSULE STUDY;  Surgeon: Danie Binder, MD;  [facility];  Service: Endoscopy;  Laterality: N/A;  700  . HEMORRHOID BANDING N/A 01/21/2016   Procedure: HEMORRHOID BANDING;  Surgeon: Danie Binder, MD;  [facility];  Service: Endoscopy;  Laterality: N/A;  . INGUINAL HERNIA  REPAIR Right 08/28/2013   Procedure: RIGHT NGUINAL HERNIORRHAPHY WITH MESH;  Surgeon: Jamesetta So, MD;  Location: [facility];  Service: General;  Laterality: Right;  . INSERTION OF MESH N/A 06/09/2013   Procedure: INSERTION OF MESH;  Surgeon: Jamesetta So, MD;  Location: [facility];  Service: General;  Laterality: N/A;  . INSERTION OF MESH Right 08/28/2013   Procedure: INSERTION OF MESH;  Surgeon: Jamesetta So, MD;  Location: [facility];  Service: General;  Laterality: Right;  . PARACENTESIS  Feb 2014   10.7 liters  . TOOTH EXTRACTION Left 12/12/14  .  UMBILICAL HERNIA REPAIR N/A 06/09/2013   Procedure: UMBILICAL HERNIORRHAPHY WITH MESH;  Surgeon: Jamesetta So, MD;  Location: [facility];  Service: General;  Laterality: N/A;    Prior to Admission medications   Medication Sig Start Date End Date Taking? Authorizing Provider  aspirin 325 MG tablet Take 1 tablet (325 mg total) by mouth daily. Patient taking differently: Take 325 mg by mouth every morning.  10/31/18  Yes Virl Cagey, MD  atorvastatin (LIPITOR) 40 MG tablet TAKE (1) TABLET BY MOUTH ONCE DAILY. Patient taking differently: Take 40 mg by mouth daily.  11/21/18  Yes Mikey Kirschner, MD  bumetanide (BUMEX) 1 MG tablet TAKE (1) TABLET BY MOUTH TWICE DAILY. Patient taking differently: Take 1 mg by mouth 2 (two) times daily.  12/13/18  Yes Carlis Stable, NP  buPROPion (WELLBUTRIN SR) 100 MG 12 hr tablet TAKE (1) TABLET BY MOUTH TWICE DAILY. Patient taking differently: Take 100 mg by mouth 2 (two) times daily.  10/17/18  Yes Mikey Kirschner, MD  Camphor-Menthol-Methyl Sal (SALONPAS) 3.04-04-08 % PTCH Place 1 patch onto the skin daily as needed (pain.).   Yes [provider]  cholecalciferol (VITAMIN D3) 25 MCG (1000 UT) tablet Take 2 tablets (2,000 Units total) by mouth daily. Patient taking differently: Take 1,000 Units by mouth every morning.  12/14/18  Yes Tashica Provencio L, MD  docusate sodium (COLACE) 100 MG capsule Take 100-200 mg by mouth 2 (two) times daily as needed (constipation.).    Yes [provider]  escitalopram (LEXAPRO) 20 MG tablet TAKE (1) TABLET BY MOUTH AT BEDTIME. Patient taking differently: Take 20 mg by mouth at bedtime.  10/17/18  Yes Mikey Kirschner, MD  famotidine (PEPCID) 20 MG tablet Take 20 mg by mouth daily.   Yes [provider]  fluticasone (FLONASE) 50 MCG/ACT nasal spray Place 1 spray into both nostrils See admin instructions. Instill 1 spray into each nostril every morning, may also use later in the day as needed for sinus  congestion   Yes [provider]  Lifitegrast (XIIDRA) 5 % SOLN Place 1 drop into both eyes 2 (two) times daily.   Yes [provider]  linaclotide (LINZESS) 145 MCG CAPS capsule Take 145 mcg by mouth daily before breakfast.   Yes [provider]  loratadine (CLARITIN) 10 MG tablet Take 10 mg by mouth daily.   Yes [provider]  losartan (COZAAR) 25 MG tablet TAKE (1) TABLET BY MOUTH ONCE DAILY. Patient taking differently: Take 25 mg by mouth daily.  10/17/18  Yes Mikey Kirschner, MD  Multiple Vitamin (MULTIVITAMIN WITH MINERALS) TABS tablet Take 1 tablet by mouth daily.   Yes [provider]  Polyvinyl Alcohol-Povidone (REFRESH OP) Place 1 drop into both eyes 3 (three) times daily as needed (dry eyes).   Yes [provider]  Simethicone (GAS-X PO) Take 1  tablet by mouth 2 (two) times daily as needed (bloating/flatulence).   Yes [provider]  sodium chloride (OCEAN) 0.65 % nasal spray Place 1 spray into the nose 2 (two) times daily as needed for congestion.    Yes [provider]  spironolactone (ALDACTONE) 100 MG tablet TAKE (1) TABLET BY MOUTH TWICE DAILY. Patient taking differently: Take 50 mg by mouth 2 (two) times daily.  10/18/18  Yes Mahala Menghini, PA-C  Tofacitinib Citrate ER (XELJANZ XR) 11 MG TB24 Take 11 mg by mouth daily.    Yes [provider]    Allergies as of 03/14/2019 - Review Complete 03/14/2019  Allergen Reaction Noted  . Omeprazole Shortness Of Breath 08/19/2011  . Orencia [abatacept] Anaphylaxis, Hives, and Rash 10/25/2017  . Other Shortness Of Breath and Rash 01/09/2013  . Phenytoin Rash   . Keflex [cephalexin] Other (See Comments)   . Phenergan [promethazine hcl] Other (See Comments) 04/08/2012    Family History  Problem Relation Age of Onset  . Cirrhosis Mother        nash  . COPD Father   . Heart disease Father   . Asthma Other   . Heart disease Other   . Colon cancer Neg  Hx   . Inflammatory bowel disease Neg Hx     Social History   Socioeconomic History  . Marital status: Married    Spouse name: Kris Mouton  . Number of children: 0  . Years of education: Not on file  . Highest education level: Not on file  Occupational History    Employer: RETIRED  Tobacco Use  . Smoking status: Former Smoker    Packs/day: 2.00    Years: 12.00    Pack years: 24.00    Types: Cigarettes    Quit date: 03/31/1995    Years since quitting: 23.9  . Smokeless tobacco: Never Used  . Tobacco comment: quit in 1997  Substance and Sexual Activity  . Alcohol use: No    Comment: Bottle of wine a day for years, but quit in 07/2011  . Drug use: No  . Sexual activity: Not Currently    Birth control/protection: Post-menopausal  Other Topics Concern  . Not on file  Social History Narrative   Lives   Caffeine use:    Quit drinking alcohol 3 YEARS ago. No drugs, no IVDU.    Social Determinants of Health   Financial Resource Strain:   . Difficulty of Paying Living Expenses: Not on file  Food Insecurity:   . Worried About Charity fundraiser in the Last Year: Not on file  . Ran Out of Food in the Last Year: Not on file  Transportation Needs:   . Lack of Transportation (Medical): Not on file  . Lack of Transportation (Non-Medical): Not on file  Physical Activity:   . Days of Exercise per Week: Not on file  . Minutes of Exercise per Session: Not on file  Stress:   . Feeling of Stress : Not on file  Social Connections:   . Frequency of Communication with Friends and Family: Not on file  . Frequency of Social Gatherings with Friends and Family: Not on file  . Attends Religious Services: Not on file  . Active Member of Clubs or Organizations: Not on file  . Attends Archivist Meetings: Not on file  . Marital Status: Not on file  Intimate Partner Violence:   . Fear of Current or Ex-Partner: Not on file  . Emotionally  Abused: Not on file  . Physically Abused: Not  on file  . Sexually Abused: Not on file    Review of Systems: See HPI, otherwise negative ROS   Physical Exam: BP 123/61 (BP Location: Right Arm)   Pulse 65   Temp 97.9 F (36.6 C) (Oral)   Resp 17   Ht 5\' 8"  (1.727 m)   Wt 109.9 kg   SpO2 100%   BMI 36.84 kg/m  General:   Alert,  pleasant and cooperative in NAD Head:  Normocephalic and atraumatic. Neck:  Supple; Lungs:  Clear throughout to auscultation.    Heart:  Regular rate and rhythm. Abdomen:  Soft, nontender and nondistended. Normal bowel sounds, without guarding, and without rebound.   Neurologic:  Alert and  oriented x4;  grossly normal neurologically.  Impression/Plan:     MELENA  PLAN: 1. EGD TODAY.  DISCUSSED PROCEDURE, BENEFITS, & RISKS: < 1% chance of medication reaction, bleeding, perforation, or ASPIRATION.

## 2019-03-16 NOTE — Telephone Encounter (Signed)
REVIEWED-NO ADDITIONAL RECOMMENDATIONS. 

## 2019-03-16 NOTE — Progress Notes (Signed)
Patient Demographics:    Deanna Thompson, is a 63 y.o. female, DOB - December 05, 1955, [id number]  Admit date - 03/14/2019   Admitting Physician Catrice Zuleta Denton Brick, MD  Outpatient Primary MD for the patient is Luking, Grace Bushy, MD  LOS - 1   Chief Complaint  Patient presents with  . Rectal Bleeding        Subjective:    [facility] today has no fevers, no emesis,  No chest pain,   -Tolerated EGD well, postprocedure had abdominal pain, CT abdomen and pelvis without free air or perforation  Assessment  & Plan :    Principal Problem:   GI bleeding Active Problems:   Cirrhosis (HCC)   Depression   Psoriatic arthritis ([place])   Symptomatic anemia  Brief Summary:- 63 y.o. female,  w Depression, Seizures, Hypertension, CKD stage 3, Cardiomyopathy (EF 45-50%), w mild aortic stenosis, mild mitral regurgitation,  h/o TIA, h/o Brain aneurysm, w metal coil and clip (No MRI please),  B12 deficiency, Psoriatic arthritis, Sjogrens disease,  Iron deficiency anemia, prior colonoscopy [id number], Alcoholic Cirrhosis,  w recent EGD 03/07/19-> esophagitis, angiodysplasia of stomach, admitted on 03/14/2019 with an episode of GI bleed after presenting with with c/o nausea, dizziness, rectal bleeding, black and tarry stools   A/p 1)Acute on chronic iron deficiency anemia with history of known gastric AVMs ------Hemoglobin is up to 8.5 g.  It was 7.7 g this morning. Covid test negative. -Patient apparently is intolerant to PPI, treat with IV Pepcid -Post EGD under monitored anesthesia with Dr. Oneida Alar on 03/16/2019--- with finding of GI BLEED DUE TO GASTRIC AVMs. 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 Injected. Treated with bipolar cautery.  MRI compatible clips were placed. -- -Patient had a GI capsule on 07/13/2018 2916 that showed active GI bleeding the distal jejunum or proximal ileum  [facility]) -Serum iron is 36, TIBC 412, iron saturation 9 -Ferritin is borderline low at 12  2)CKD III--- renal function is stable with a creatinine of 1.2 which is around patient's baseline, renally adjust medications, avoid nephrotoxic agents / dehydration / hypotension  3)Alcoholic liver cirrhosis--clinically no evidence of encephalopathy, EGD on 08/05/2018 did not show any esophageal varices -Continue Bumex and Aldactone  3)HFrEF/ cardiomyopathy--- last known EF 45 to 50%, stable, no acute exacerbation, continue losartan 25 mg daily continue Bumex and Aldactone  4)History of brain aneurysm status post metallic coil and clip----clip" and not MRI compatible--- patient is currently asymptomatic  5) history of Sjogren's disease psoriatic arthritis--stable, may restart biologic post discharge  6) history of prior TIA--hold aspirin, continue Lipitor  7)Depression and anxiety disorder--stable, continue Wellbutrin and Lexapro  Disposition/Need for in-Hospital Stay- patient unable to be discharged at this time due to --patient with increased abdominal pain and nausea post EGD with placement of clips -GI service needs to be observed overnight  Code Status : Full  Family Communication:   NA (patient is alert, awake and coherent)  Disposition Plan  : home  Consults  :  Gi  DVT Prophylaxis  :    - SCDs   Disposition/Need for in-Hospital Stay- patient unable to be discharged at this time due to --recurrent GI bleed requiring endoluminal evaluation and persistent postprocedure abdominal pain---  Lab Results  Component Value  Date   PLT 184 03/16/2019    Inpatient Medications  Scheduled Meds: . atorvastatin  40 mg Oral Daily  . bumetanide  1 mg Oral BID  . buPROPion  100 mg Oral BID  . cholecalciferol  1,000 Units Oral QPM  . escitalopram  20 mg Oral QHS  . famotidine  20 mg Oral BID AC  .  HYDROmorphone (DILAUDID) injection  0.5 mg Intravenous Q6H  . loratadine  10 mg Oral Daily  .  losartan  25 mg Oral Daily  . multivitamin with minerals  1 tablet Oral Daily  . ondansetron (ZOFRAN) IV  4 mg Intravenous Once  . ondansetron (ZOFRAN) IV  4 mg Intravenous TID WC & HS  . potassium chloride  40 mEq Oral Q3H  . simethicone  80 mg Oral TID AC & HS  . spironolactone  50 mg Oral BID   Continuous Infusions: PRN Meds:.acetaminophen **OR** acetaminophen, artificial tears, fentaNYL (SUBLIMAZE) injection, fluticasone, HYDROmorphone (DILAUDID) injection, simethicone, sodium chloride    Anti-infectives (From admission, onward)   None        Objective:   Vitals:   03/16/19 1600 03/16/19 1615 03/16/19 1619 03/16/19 1636  BP: (!) 141/64 (!) 133/59  (!) 136/59  Pulse: (!) 59 (!) 56 (!) 59 (!) 57  Resp: 12 17 13 16   Temp:    98.3 F (36.8 C)  TempSrc:    Oral  SpO2: 100% 96% 97% 100%  Weight:      Height:        Wt Readings from Last 3 Encounters:  03/16/19 109.9 kg  03/06/19 111.1 kg  02/20/19 111.5 kg     Intake/Output Summary (Last 24 hours) at 03/16/2019 1753 Last data filed at 03/16/2019 1414 Gross per 24 hour  Intake 400 ml  Output --  Net 400 ml     Physical Exam  Gen:- Awake Alert, speaking in sentences HEENT:- [place].AT, No sclera icterus Neck-Supple Neck,No JVD,.  Lungs-  CTAB , fair symmetrical air movement CV- S1, S2 normal, regular  Abd-  +ve B.Sounds, Abd Soft, epigastric and periumbilical area discomfort on palpation, no rebound or guarding Extremity/Skin:- No  edema, pedal pulses present  Psych-affect is appropriate, oriented x3 Neuro-no new focal deficits, no tremors   Data Review:   Micro Results Recent Results (from the past 240 hour(s))  SARS CORONAVIRUS 2 (TAT 6-24 HRS) Nasopharyngeal Nasopharyngeal Swab     Status: None   Collection Time: 03/14/19  7:41 PM   Specimen: Nasopharyngeal Swab  Result Value Ref Range Status   SARS Coronavirus 2 NEGATIVE NEGATIVE Final    Comment: (NOTE) SARS-CoV-2 target nucleic acids are NOT  DETECTED. The SARS-CoV-2 RNA is generally detectable in upper and lower respiratory specimens during the acute phase of infection. Negative results do not preclude SARS-CoV-2 infection, do not rule out co-infections with other pathogens, and should not be used as the sole basis for treatment or other patient management decisions. Negative results must be combined with clinical observations, patient history, and epidemiological information. The expected result is Negative. Fact Sheet for Patients: SugarRoll.be Fact Sheet for Healthcare Providers: https://www.woods-mathews.com/ This test is not yet approved or cleared by the [place] FDA and  has been authorized for detection and/or diagnosis of SARS-CoV-2 by FDA under an Emergency Use Authorization (EUA). This EUA will remain  in effect (meaning this test can be used) for the duration of the COVID-19 declaration under Section 56 4(b)(1) of the Act, 21 U.S.C. section 360bbb-3(b)(1), unless the  authorization is terminated or revoked sooner. Performed at [facility], [place] [address], [place], [place] [address]     Radiology Reports CT ABDOMEN PELVIS WO CONTRAST  Result Date: 03/16/2019 CLINICAL DATA:  Severe epigastric pain following endoscopy today. EXAM: CT ABDOMEN AND PELVIS WITHOUT CONTRAST TECHNIQUE: Multidetector CT imaging of the abdomen and pelvis was performed following the standard protocol without IV contrast. COMPARISON:  10/20/2018 FINDINGS: Lower chest: No acute findings.  No pleural effusion. Hepatobiliary: Morphologic changes of advanced cirrhosis with central volume loss significant surface nodularity. No discrete liver mass. Gallbladder surgically absent. No bile duct dilation. Pancreas: Unremarkable. No pancreatic ductal dilatation or surrounding inflammatory changes. Spleen: Normal in size without focal abnormality. Adrenals/Urinary Tract: No adrenal masses. Bilateral  renal cortical thinning. No renal masses. Tiny medullary region stone suggested in each kidney. No hydronephrosis. Ureters normal in course and in caliber. Bladder is mostly decompressed but otherwise unremarkable. Stomach/Bowel: There are linear opaque densities reside in the stomach lumen, 2 projecting in the proximal to mid stomach and 2 in the mid to distal stomach. These are of unclear etiology. A did not extend beyond the confines of the stomach wall. Are new since the prior CT. No stomach wall thickening. No adjacent inflammation and no extraluminal air. Small bowel and colon are normal caliber. No wall thickening. No inflammation. Normal appendix visualized. Vascular/Lymphatic: Aortic atherosclerosis.  No aneurysm. No enlarged lymph nodes. Reproductive: Uterus and bilateral adnexa are unremarkable. Other: No free intraperitoneal air. No ascites. No abdominal wall hernia. Musculoskeletal: No fracture or acute finding. No osteoblastic or osteolytic lesions. IMPRESSION: 1. There are 4 linear radiopaque foreign bodies that reside within the stomach, but do not extend beyond the stomach wall. These do not appear to penetrate the stomach wall. The etiology is unclear. 2. No evidence of esophageal injury. No stomach or bowel wall thickening. No extraluminal air. 3. Advanced cirrhosis.  No visualized liver mass. 4. No bowel obstruction or inflammation. 5. Aortic atherosclerosis. Electronically Signed   By: Lajean Manes M.D.   On: 03/16/2019 15:43     CBC Recent Labs  Lab 03/14/19 1552 03/15/19 0445 03/15/19 1402 03/16/19 0616 03/16/19 1324  WBC 7.0 6.6  --  4.7  --   HGB 8.8* 7.7* 8.4* 8.5* 8.5*  HCT 27.7* 24.4* 26.4* 25.8* 25.0*  PLT 228 187  --  184  --   MCV 92.0 93.1  --  92.8  --   MCH 29.2 29.4  --  30.6  --   MCHC 31.8 31.6  --  32.9  --   RDW 17.1* 17.1*  --  17.0*  --   LYMPHSABS 1.1  --   --   --   --   MONOABS 0.5  --   --   --   --   EOSABS 0.1  --   --   --   --   BASOSABS 0.0  --    --   --   --     Chemistries  Recent Labs  Lab 03/14/19 1552 03/15/19 0445 03/16/19 0616 03/16/19 1324 03/16/19 1504  NA 138 140 140 139  --   K 3.5 3.7 3.2* 3.7  --   CL 103 109 104 105  --   CO2 23 24 26   --   --   GLUCOSE 106* 100* 108* 103*  --   BUN 34* 28* 18 14  --   CREATININE 1.14* 1.16* 1.20* 1.10*  --   CALCIUM  9.1 8.6* 8.7*  --   --   AST 20 20  --   --  30  ALT 20 20  --   --  25  ALKPHOS 56 50  --   --  55  BILITOT 0.3 0.5  --   --  0.7   ------------------------------------------------------------------------------------------------------------------ No results for input(s): CHOL, HDL, LDLCALC, TRIG, CHOLHDL, LDLDIRECT in the last 72 hours.  Lab Results  Component Value Date   HGBA1C 5.8 (H) 08/01/2017   ------------------------------------------------------------------------------------------------------------------ No results for input(s): TSH, T4TOTAL, T3FREE, THYROIDAB in the last 72 hours.  Invalid input(s): FREET3 ------------------------------------------------------------------------------------------------------------------ Recent Labs    03/15/19 1402  FERRITIN 12  TIBC 412  IRON 36    Coagulation profile Recent Labs  Lab 03/14/19 1552  INR 1.2    No results for input(s): DDIMER in the last 72 hours.  Cardiac Enzymes No results for input(s): CKMB, TROPONINI, MYOGLOBIN in the last 168 hours.  Invalid input(s): CK ------------------------------------------------------------------------------------------------------------------ No results found for: BNP   Roxan Hockey M.D on 03/16/2019 at 5:53 PM  Go to [email or web address] - for contact info  [facility] - Office  [phone]

## 2019-03-16 NOTE — Transfer of Care (Signed)
Immediate Anesthesia Transfer of Care Note  Patient: [facility]  Procedure(s) Performed: ESOPHAGOGASTRODUODENOSCOPY (EGD) WITH PROPOFOL (N/A ) GIVENS CAPSULE STUDY (N/A )  Patient Location: PACU  Anesthesia Type:MAC  Level of Consciousness: awake, alert , oriented and patient cooperative  Airway & Oxygen Therapy: Patient Spontanous Breathing  Post-op Assessment: Report given to RN, Post -op Vital signs reviewed and stable and Patient moving all extremities X 4  Post vital signs: Reviewed and stable  Last Vitals:  Vitals Value Taken Time  BP    Temp    Pulse 77 03/16/19 1424  Resp    SpO2 100 % 03/16/19 1424  Vitals shown include unvalidated device data.  Last Pain:  Vitals:   03/16/19 1326  TempSrc: Oral  PainSc: 0-No pain      Patients Stated Pain Goal: 2 (29/56/21 3086)  Complications: No apparent anesthesia complications

## 2019-03-16 NOTE — Op Note (Signed)
[facility] Patient Name: [facility] Procedure Date: 03/16/2019 1:12 PM MRN: [id number] Date of Birth: 12-16-1955 Attending MD: Barney Drain MD, MD CSN: [id number] Age: 63 Admit Type: Inpatient Procedure:                Upper GI endoscopy with control bleeding/submucosal                            injection Indications:              Melena, Acute post hemorrhagic anemia Providers:                Barney Drain MD, MD, Hinton Rao, RN, Randa Spike, Technician Referring MD:             Rosemary Holms, MD Medicines:                Propofol per Anesthesia Complications:            Abdominal pain(EPIGASTRIC RADIATING TO BACK).                            OBTAIN STAT ECG AND CT ABD/PELVIS WO CONTRAST.                            CHECK HFP/LIPASE. FENTANYL AND ZOFRAN PRN. 1447:                            DISCUSSED WITH PT AND HUSBAND. Estimated Blood Loss:     Estimated blood loss was minimal. Procedure:                Pre-Anesthesia Assessment:                           - Prior to the procedure, a History and Physical                            was performed, and patient medications and                            allergies were reviewed. The patient's tolerance of                            previous anesthesia was also reviewed. The risks                            and benefits of the procedure and the sedation                            options and risks were discussed with the patient.                            All questions were answered, and informed consent  was obtained. Prior Anticoagulants: The patient has                            taken no previous anticoagulant or antiplatelet                            agents except for aspirin. ASA Grade Assessment: II                            - A patient with mild systemic disease. After                            reviewing the risks and benefits, the patient was                  deemed in satisfactory condition to undergo the                            procedure. After obtaining informed consent, the                            endoscope was passed under direct vision.                            Throughout the procedure, the patient's blood                            pressure, pulse, and oxygen saturations were                            monitored continuously. The [id number]) was                            introduced through the mouth, and advanced to the                            duodenal bulb. The upper GI endoscopy was                            accomplished without difficulty. The patient                            tolerated the procedure well. Scope In: 1:58:29 PM Scope Out: 2:13:34 PM Total Procedure Duration: 0 hours 15 minutes 5 seconds  Findings:      The examined esophagus was normal.      Two medium angioectasias with stigmata of recent bleeding were found in       the gastric body. Area was successfully injected with 8 mL of a 1:10,000       solution of epinephrine for drug delivery. Coagulation for hemostasis       using bipolar probe was successful. To prevent bleeding       post-intervention, four hemostatic clips were successfully placed (MR       conditional). There was no bleeding at the end of the procedure.      The examined duodenum was normal.  Impression:               - GI BLEED DUE TO GASTRIC AVMs. Two recently                            bleeding angioectasias in the stomach. Injected.                            Treated with bipolar cautery. Clips (MR                            conditional) were placed. Moderate Sedation:      Per Anesthesia Care Recommendation:           - Patient has a contact number available for                            emergencies. The signs and symptoms of potential                            delayed complications were discussed with the                            patient. Return to normal  activities tomorrow.                            Written discharge instructions were provided to the                            patient.                           - Low fat diet.                           - Continue present medications.                           - Return to GI office in 4 months. Procedure Code(s):        --- Professional ---                           [phone], Esophagogastroduodenoscopy, flexible,                            transoral; with control of bleeding, any method                           43236, 59, Esophagogastroduodenoscopy, flexible,                            transoral; with directed submucosal injection(s),                            any substance Diagnosis Code(s):        --- Professional ---  K31.811, Angiodysplasia of stomach and duodenum                            with bleeding                           K92.1, Melena (includes Hematochezia)                           D62, Acute posthemorrhagic anemia CPT copyright 2019 American Medical Association. All rights reserved. The codes documented in this report are preliminary and upon coder review may  be revised to meet current compliance requirements. Barney Drain, MD Barney Drain MD, MD 03/16/2019 2:51:05 PM This report has been signed electronically. Number of Addenda: 0

## 2019-03-17 ENCOUNTER — Telehealth: Payer: Self-pay | Admitting: Nurse Practitioner

## 2019-03-17 LAB — BASIC METABOLIC PANEL
Anion gap: 8 (ref 5–15)
BUN: 14 mg/dL (ref 8–23)
CO2: 24 mmol/L (ref 22–32)
Calcium: 9 mg/dL (ref 8.9–10.3)
Chloride: 107 mmol/L (ref 98–111)
Creatinine, Ser: 1.23 mg/dL — ABNORMAL HIGH (ref 0.44–1.00)
GFR calc Af Amer: 54 mL/min — ABNORMAL LOW (ref >60)
GFR calc non Af Amer: 47 mL/min — ABNORMAL LOW (ref >60)
Glucose, Bld: 106 mg/dL — ABNORMAL HIGH (ref 70–99)
Potassium: 4.3 mmol/L (ref 3.5–5.1)
Sodium: 139 mmol/L (ref 135–145)

## 2019-03-17 LAB — CBC
HCT: 26.4 % — ABNORMAL LOW (ref 36.0–46.0)
Hemoglobin: 8.3 g/dL — ABNORMAL LOW (ref 12.0–15.0)
MCH: 29.5 pg (ref 26.0–34.0)
MCHC: 31.4 g/dL (ref 30.0–36.0)
MCV: 94 fL (ref 80.0–100.0)
Platelets: 204 10*3/uL (ref 150–400)
RBC: 2.81 MIL/uL — ABNORMAL LOW (ref 3.87–5.11)
RDW: 17.2 % — ABNORMAL HIGH (ref 11.5–15.5)
WBC: 6.8 10*3/uL (ref 4.0–10.5)
nRBC: 0 % (ref 0.0–0.2)

## 2019-03-17 MED ORDER — SPIRONOLACTONE 100 MG PO TABS: 50.0000 mg | ORAL_TABLET | Freq: Two times a day (BID) | ORAL | 4 refills | Status: DC

## 2019-03-17 MED ORDER — BUMETANIDE 1 MG PO TABS: 1.0000 mg | ORAL_TABLET | Freq: Two times a day (BID) | ORAL | 3 refills | Status: DC

## 2019-03-17 MED ORDER — FAMOTIDINE 20 MG PO TABS: 20.0000 mg | ORAL_TABLET | Freq: Two times a day (BID) | ORAL | 5 refills | Status: DC

## 2019-03-17 MED ORDER — LOSARTAN POTASSIUM 25 MG PO TABS: 25.0000 mg | ORAL_TABLET | Freq: Every day | ORAL | 5 refills | Status: DC

## 2019-03-17 MED ORDER — ATORVASTATIN CALCIUM 40 MG PO TABS: 40.0000 mg | ORAL_TABLET | Freq: Every day | ORAL | 5 refills | Status: DC

## 2019-03-17 MED ORDER — ASPIRIN EC 81 MG PO TBEC: 81.0000 mg | DELAYED_RELEASE_TABLET | Freq: Every day | ORAL | 2 refills | Status: AC

## 2019-03-17 NOTE — Progress Notes (Signed)
Reviewed discharge instructions with patient and patient's husband. Both verbalized understanding of instructions.  Patient discharged home instable condition with husband.

## 2019-03-17 NOTE — Discharge Summary (Signed)
[place], is a 63 y.o. female  DOB 06/17/1955  MRN [id number].  Admission date:  03/14/2019  Admitting Physician  Roxan Hockey, MD  Discharge Date:  03/17/2019   Primary MD  Mikey Kirschner, MD  Recommendations for primary care physician for things to follow:   1)Avoid ibuprofen/Advil/Aleve/Motrin/Goody Powders/Naproxen/BC powders/Meloxicam/Diclofenac/Indomethacin and other Nonsteroidal anti-inflammatory medications as these will make you more likely to bleed and can cause stomach ulcers, can also cause Kidney problems.   2) change aspirin to 81 mg daily with food  3) you need repeat CBC and CMP blood test with the primary care physician in about a week or so  4) follow-up with Dr. Oneida Alar the gastroenterologist in 3 to 4 months  Admission Diagnosis  GI bleeding [K92.2] Symptomatic anemia [D64.9]   Discharge Diagnosis  GI bleeding [K92.2] Symptomatic anemia [D64.9]    Principal Problem:   GI bleeding Active Problems:   Cirrhosis ([place])   Depression   Psoriatic arthritis ([place])   Symptomatic anemia      Past Medical History:  Diagnosis Date  . Abdominal hernia    repaired  . Abscess of pulp of tooth 12/12/14   left side  . Anxiety   . Arthritis    back  and down flank  right side more than left  . B12 deficiency 06/12/2015  . Brain aneurysm    METAL COIL PREVENTS MRI IMAGING and CLIP  . Cirrhosis ([place])    Presumably alcoholic cirrhosis  . Complication of anesthesia   . Depression   . ETOH abuse    Quit in 07/2011   . Fatigue    due to Sjogren's disease  . Folate deficiency 06/30/2016  . GERD (gastroesophageal reflux disease)   . HTN (hypertension)    no longer medicated, dated on 04/25/12  . IBS (irritable bowel syndrome)   . Iron deficiency anemia due to chronic blood loss   . Myofascial pain   . Panic attack   . PONV (postoperative nausea and vomiting)   . Psoriatic  arthritis ([place])   . Right inguinal hernia 08/16/2012  . Seizures ([place]) 1997   1 seizure from coil insertion but no more seizures and no meds for seizures  . Sjogren's disease ([place])   . [place] HEAD 02/03/2007   Qualifier: Diagnosis of  By: Aline Brochure MD, Dorothyann Peng    . TIA (transient ischemic attack)   . 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([place])   . Vertigo     Past Surgical History:  Procedure Laterality Date  . AGILE CAPSULE N/A 06/21/2014   Procedure: AGILE CAPSULE;  Surgeon: Danie Binder, MD;  [facility];  Service: Endoscopy;  Laterality: N/A;  0700  . BIOPSY  10/13/2011   SLF: mild gastritis/duodenal polypoid lesion   . CEREBRAL ANEURYSM REPAIR  1997  . CHOLECYSTECTOMY N/A 10/28/2018   Procedure: LAPAROSCOPIC CHOLECYSTECTOMY WITH INTRAOPERATIVE CHOLANGIOGRAM;  Surgeon: Virl Cagey, MD;  Location: [facility];  Service: General;  Laterality: N/A;  . COLONOSCOPY  10/13/2011   [id number]  hemorrhoids/varices rectal/lesion in ascending colon(HYPERPLASTIC POLYP)  . COLONOSCOPY N/A 01/17/2013   SLF: 1. Internal hemorrhoids 2. Ascending colon polyps 3.  Rectal varices.   . COLONOSCOPY N/A 08/10/2016   Dr. Oneida Alar: redundant left colon. external and internal hemorhroids. 5-10 year surveillance  . ESOPHAGOGASTRODUODENOSCOPY N/A 06/29/2014   SLF: 1. anemia due to polypoid lesions in teh antrum, and duodenal polyps 2. single duodenum AVM  . ESOPHAGOGASTRODUODENOSCOPY (EGD) WITH PROPOFOL N/A 03/07/2019   Procedure: ESOPHAGOGASTRODUODENOSCOPY (EGD) WITH PROPOFOL;  Surgeon: Danie Binder, MD;  [facility];  Service: Endoscopy;  Laterality: N/A;  1:00pm  . FLEXIBLE SIGMOIDOSCOPY N/A 01/21/2016   Procedure: FLEXIBLE SIGMOIDOSCOPY;  Surgeon: Danie Binder, MD;  [facility];  Service: Endoscopy;  Laterality: N/A;  245  . GIVENS CAPSULE STUDY N/A 06/29/2014   Procedure: GIVENS CAPSULE STUDY;  Surgeon: Danie Binder, MD;  [facility];   Service: Endoscopy;  Laterality: N/A;  . GIVENS CAPSULE STUDY N/A 07/13/2014   Procedure: GIVENS CAPSULE STUDY;  Surgeon: Danie Binder, MD;  [facility];  Service: Endoscopy;  Laterality: N/A;  700  . HEMORRHOID BANDING N/A 01/21/2016   Procedure: HEMORRHOID BANDING;  Surgeon: Danie Binder, MD;  [facility];  Service: Endoscopy;  Laterality: N/A;  . INGUINAL HERNIA REPAIR Right 08/28/2013   Procedure: RIGHT NGUINAL HERNIORRHAPHY WITH MESH;  Surgeon: Jamesetta So, MD;  Location: [facility];  Service: General;  Laterality: Right;  . INSERTION OF MESH N/A 06/09/2013   Procedure: INSERTION OF MESH;  Surgeon: Jamesetta So, MD;  Location: [facility];  Service: General;  Laterality: N/A;  . INSERTION OF MESH Right 08/28/2013   Procedure: INSERTION OF MESH;  Surgeon: Jamesetta So, MD;  Location: [facility];  Service: General;  Laterality: Right;  . PARACENTESIS  Feb 2014   10.7 liters  . TOOTH EXTRACTION Left 12/12/14  . UMBILICAL HERNIA REPAIR N/A 06/09/2013   Procedure: UMBILICAL HERNIORRHAPHY WITH MESH;  Surgeon: Jamesetta So, MD;  Location: [facility];  Service: General;  Laterality: N/A;       HPI  from the history and physical done on the day of admission:    -[place]  is a 63 y.o. female,  w Depression, Seizures, Hypertension, CKD stage 3, Cardiomyopathy (EF 45-50%), w mild aortic stenosis, mild mitral regurgitation,  h/o TIA, h/o Brain aneurysm, w metal coil and clip (No MRI please),  B12 deficiency, Psoriatic arthritis, Sjogrens disease,  Iron deficiency anemia, prior colonoscopy [id number], Alcoholic Cirrhosis,  w recent EGD 03/07/19-> esophagitis, angiodysplasia of stomach, presents with c/o nausea, dizziness, rectal bleeding, black and tarry stools starting 1 day ago. Pt has had 2 black bm. Pt denies fever, chills, abd pain, diarrhea, brbpr.    In Ed,  T 98.4, P 93 R 20, Bp 109/78  Pox 100%   Wbc 7.0 Hgb 8.8, Plt 228 Na 138, K 3.5  Bun 34, Creatinine  1.14 Ast 20, Alt 20 INR 1.2 Glucose 106  Pt will be admitted for rectal bleeding.   Hospital Course:    Brief Summary:- 63 y.o.female,w Depression, Seizures, Hypertension, CKD stage 3, Cardiomyopathy (EF 45-50%), w mild aortic stenosis, mild mitral regurgitation, h/o TIA, h/o Brain aneurysm, w metal coil and clip (No MRI please), B12 deficiency, Psoriatic arthritis, Sjogrens disease, Iron deficiency anemia, prior colonoscopy [id number], Alcoholic Cirrhosis, w recent EGD 03/07/19->esophagitis, angiodysplasia of stomach, admitted on 03/14/2019 with an episode of GI bleed after presenting with with  c/o nausea, dizziness, rectal bleeding, black and tarry stools   A/p 1)Acute on chronic iron deficiency anemia with history of known gastric AVMs ------Hemoglobin is up to 8.3 g.   Covid test negative. -Patient apparently is intolerant to PPI, treated with  Pepcid -Post EGD under monitored anesthesia with Dr. Oneida Alar on 03/16/2019--- with finding of GI BLEED DUE TO GASTRIC AVMs. 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 Injected. Treated with bipolar cautery.  MRI compatible clipswere placed. -- -Patient had a GI capsule on 07/13/2018 2916 that showed active GI bleeding the distal jejunum or proximal ileum [facility]) -Serum iron is 36, TIBC 412, iron saturation 9 -Ferritin is borderline low at 12 -Repeat CBC within a week advised, -Outpatient GI follow-up in 3 to 4 months advised  2)CKD III--- renal function is stable with a creatinine of 1.2 which is around patient's baseline, renally adjust medications, avoid nephrotoxic agents / dehydration / hypotension  3)Alcoholic liver cirrhosis--clinically no evidence of encephalopathy, EGD on 08/05/2018 and 03/16/2019 did not show any esophageal varices -Continue Bumex and Aldactone  3)HFrEF/ cardiomyopathy--- last known EF 45 to 50%, stable, no acute exacerbation, continue losartan 25 mg daily continue Bumex and  Aldactone  4)History of brain aneurysm status post metallic coil and clip----clip" and not MRI compatible--- patient is currently asymptomatic  5) history of Sjogren's disease psoriatic arthritis--stable, may restart biologic post discharge  6) history of prior TIA--continue Lipitor, change aspirin to 81 mg daily from 325 mg daily  7)Depression and anxiety disorder--stable, continue Wellbutrin and Lexapro  Disposition -Home  Code Status : Full  Family Communication:   NA (patient is alert, awake and coherent)   Consults  :  Gi  Discharge Condition: Stable  Follow UP-PCP within a week for CBC recheck, GI physician in 3 to 4 months   Consults obtained - Gi  Diet and Activity recommendation:  As advised  Discharge Instructions    Discharge Instructions    Call MD for:  difficulty breathing, headache or visual disturbances   Complete by: As directed    Call MD for:  persistant dizziness or light-headedness   Complete by: As directed    Call MD for:  persistant nausea and vomiting   Complete by: As directed    Call MD for:  severe uncontrolled pain   Complete by: As directed    Call MD for:  temperature >100.4   Complete by: As directed    Diet - low sodium heart healthy   Complete by: As directed    Discharge instructions   Complete by: As directed    1)Avoid ibuprofen/Advil/Aleve/Motrin/Goody Powders/Naproxen/BC powders/Meloxicam/Diclofenac/Indomethacin and other Nonsteroidal anti-inflammatory medications as these will make you more likely to bleed and can cause stomach ulcers, can also cause Kidney problems.   2) change aspirin to 81 mg daily with food  3) you need repeat CBC and CMP blood test with the primary care physician in about a week or so  4) follow-up with Dr. Oneida Alar the gastroenterologist in 3 to 4 months   Increase activity slowly   Complete by: As directed         Discharge Medications     Allergies as of 03/17/2019      Reactions    Omeprazole Shortness Of Breath   Orencia [abatacept] Anaphylaxis, Hives, Rash   Other Shortness Of Breath, Rash   Red Meat Causes shortness of breath, rash and flu like symptoms.    Phenytoin Rash   Keflex [cephalexin] Other (See Comments)  Severe yeast infection   Lidocaine Viscous Hcl    NAUSEA/PROLONGED NUMBNESS   Phenergan [promethazine Hcl] Other (See Comments)   Not in right state of mind, HALLUCINATION      Medication List    STOP taking these medications   aspirin 325 MG tablet Replaced by: aspirin EC 81 MG tablet   docusate sodium 100 MG capsule Commonly known as: COLACE     TAKE these medications   aspirin EC 81 MG tablet Take 1 tablet (81 mg total) by mouth daily with breakfast. Replaces: aspirin 325 MG tablet   atorvastatin 40 MG tablet Commonly known as: LIPITOR Take 1 tablet (40 mg total) by mouth daily. What changed: See the new instructions.   bumetanide 1 MG tablet Commonly known as: BUMEX Take 1 tablet (1 mg total) by mouth 2 (two) times daily.   buPROPion 100 MG 12 hr tablet Commonly known as: WELLBUTRIN SR TAKE (1) TABLET BY MOUTH TWICE DAILY. What changed:   how much to take  how to take this  when to take this  additional instructions   cholecalciferol 25 MCG (1000 UT) tablet Commonly known as: VITAMIN D3 Take 2 tablets (2,000 Units total) by mouth daily. What changed:   how much to take  when to take this   escitalopram 20 MG tablet Commonly known as: LEXAPRO TAKE (1) TABLET BY MOUTH AT BEDTIME. What changed:   how much to take  how to take this  when to take this  additional instructions   famotidine 20 MG tablet Commonly known as: PEPCID Take 1 tablet (20 mg total) by mouth 2 (two) times daily before a meal. What changed: when to take this   fluticasone 50 MCG/ACT nasal spray Commonly known as: FLONASE Place 1 spray into both nostrils See admin instructions. Instill 1 spray into each nostril every morning, may  also use later in the day as needed for sinus congestion   GAS-X PO Take 1 tablet by mouth 2 (two) times daily as needed (bloating/flatulence).   linaclotide 145 MCG Caps capsule Commonly known as: LINZESS Take 145 mcg by mouth daily before breakfast.   loratadine 10 MG tablet Commonly known as: CLARITIN Take 10 mg by mouth daily.   losartan 25 MG tablet Commonly known as: COZAAR Take 1 tablet (25 mg total) by mouth daily.   multivitamin with minerals Tabs tablet Take 1 tablet by mouth daily.   REFRESH OP Place 1 drop into both eyes 3 (three) times daily as needed (dry eyes).   Salonpas 3.04-04-08 % Ptch Generic drug: Camphor-Menthol-Methyl Sal Place 1 patch onto the skin daily as needed (pain.).   sodium chloride 0.65 % nasal spray Commonly known as: OCEAN Place 1 spray into the nose 2 (two) times daily as needed for congestion.   spironolactone 100 MG tablet Commonly known as: ALDACTONE Take 0.5 tablets (50 mg total) by mouth 2 (two) times daily. What changed: See the new instructions.   Xeljanz XR 11 MG Tb24 Generic drug: Tofacitinib Citrate ER Take 11 mg by mouth daily.   Xiidra 5 % Soln Generic drug: Lifitegrast Place 1 drop into both eyes 2 (two) times daily.       Major procedures and Radiology Reports - PLEASE review detailed and final reports for all details, in brief -   CT ABDOMEN PELVIS WO CONTRAST  Result Date: 03/16/2019 CLINICAL DATA:  Severe epigastric pain following endoscopy today. EXAM: CT ABDOMEN AND PELVIS WITHOUT CONTRAST TECHNIQUE: Multidetector CT imaging of the  abdomen and pelvis was performed following the standard protocol without IV contrast. COMPARISON:  10/20/2018 FINDINGS: Lower chest: No acute findings.  No pleural effusion. Hepatobiliary: Morphologic changes of advanced cirrhosis with central volume loss significant surface nodularity. No discrete liver mass. Gallbladder surgically absent. No bile duct dilation. Pancreas:  Unremarkable. No pancreatic ductal dilatation or surrounding inflammatory changes. Spleen: Normal in size without focal abnormality. Adrenals/Urinary Tract: No adrenal masses. Bilateral renal cortical thinning. No renal masses. Tiny medullary region stone suggested in each kidney. No hydronephrosis. Ureters normal in course and in caliber. Bladder is mostly decompressed but otherwise unremarkable. Stomach/Bowel: There are linear opaque densities reside in the stomach lumen, 2 projecting in the proximal to mid stomach and 2 in the mid to distal stomach. These are of unclear etiology. A did not extend beyond the confines of the stomach wall. Are new since the prior CT. No stomach wall thickening. No adjacent inflammation and no extraluminal air. Small bowel and colon are normal caliber. No wall thickening. No inflammation. Normal appendix visualized. Vascular/Lymphatic: Aortic atherosclerosis.  No aneurysm. No enlarged lymph nodes. Reproductive: Uterus and bilateral adnexa are unremarkable. Other: No free intraperitoneal air. No ascites. No abdominal wall hernia. Musculoskeletal: No fracture or acute finding. No osteoblastic or osteolytic lesions. IMPRESSION: 1. There are 4 linear radiopaque foreign bodies that reside within the stomach, but do not extend beyond the stomach wall. These do not appear to penetrate the stomach wall. The etiology is unclear. 2. No evidence of esophageal injury. No stomach or bowel wall thickening. No extraluminal air. 3. Advanced cirrhosis.  No visualized liver mass. 4. No bowel obstruction or inflammation. 5. Aortic atherosclerosis. Electronically Signed   By: Lajean Manes M.D.   On: 03/16/2019 15:43    Micro Results    Recent Results (from the past 240 hour(s))  SARS CORONAVIRUS 2 (TAT 6-24 HRS) Nasopharyngeal Nasopharyngeal Swab     Status: None   Collection Time: 03/14/19  7:41 PM   Specimen: Nasopharyngeal Swab  Result Value Ref Range Status   SARS Coronavirus 2 NEGATIVE  NEGATIVE Final    Comment: (NOTE) SARS-CoV-2 target nucleic acids are NOT DETECTED. The SARS-CoV-2 RNA is generally detectable in upper and lower respiratory specimens during the acute phase of infection. Negative results do not preclude SARS-CoV-2 infection, do not rule out co-infections with other pathogens, and should not be used as the sole basis for treatment or other patient management decisions. Negative results must be combined with clinical observations, patient history, and epidemiological information. The expected result is Negative. Fact Sheet for Patients: SugarRoll.be Fact Sheet for Healthcare Providers: https://www.woods-mathews.com/ This test is not yet approved or cleared by the [place] FDA and  has been authorized for detection and/or diagnosis of SARS-CoV-2 by FDA under an Emergency Use Authorization (EUA). This EUA will remain  in effect (meaning this test can be used) for the duration of the COVID-19 declaration under Section 56 4(b)(1) of the Act, 21 U.S.C. section 360bbb-3(b)(1), unless the authorization is terminated or revoked sooner. Performed at [facility], [place] [address] [place], [place] [address]        Today   Subjective    [facility] today has no new complaints No fever  Or chills  No nausea, vomiting, diarrhea        -Eating and drinking well   Patient has been seen and examined prior to discharge   Objective   Blood pressure 110/69, pulse 74, temperature 97.9 F (36.6 C), resp.  rate 15, height 5\' 8"  (1.727 m), weight 108.5 kg, SpO2 100 %.   Intake/Output Summary (Last 24 hours) at 03/17/2019 1232 Last data filed at 03/17/2019 0526 Gross per 24 hour  Intake 640 ml  Output 1500 ml  Net -860 ml    Exam Gen:- Awake Alert, no acute distress  HEENT:- [place].AT, No sclera icterus Neck-Supple Neck,No JVD,.  Lungs-  CTAB , good air movement bilaterally  CV- S1, S2 normal,  regular Abd-  +ve B.Sounds, Abd Soft, No tenderness,    Extremity/Skin:- No  edema,   good pulses Psych-affect is appropriate, oriented x3 Neuro-no new focal deficits, no tremors    Data Review   CBC w Diff:  Lab Results  Component Value Date   WBC 6.8 03/17/2019   HGB 8.3 (L) 03/17/2019   HCT 26.4 (L) 03/17/2019   HCT 41 07/30/2011   PLT 204 03/17/2019   LYMPHOPCT 15 03/14/2019   MONOPCT 7 03/14/2019   EOSPCT 1 03/14/2019   BASOPCT 1 03/14/2019    CMP:  Lab Results  Component Value Date   NA 139 03/17/2019   NA 138 07/30/2011   K 4.3 03/17/2019   K 3.8 07/30/2011   CL 107 03/17/2019   CO2 24 03/17/2019   BUN 14 03/17/2019   BUN 4 07/30/2011   CREATININE 1.23 (H) 03/17/2019   CREATININE 1.31 (H) 01/19/2017   PROT 6.5 03/16/2019   ALBUMIN 3.8 03/16/2019   ALBUMIN 3.0 07/30/2011   BILITOT 0.7 03/16/2019   BILITOT 3.6 07/30/2011   ALKPHOS 55 03/16/2019   ALKPHOS 111 07/30/2011   AST 30 03/16/2019   AST 171 07/30/2011   ALT 25 03/16/2019  .   Total Discharge time is about 33 minutes  Roxan Hockey M.D on 03/17/2019 at 12:32 PM  Go to [email or web address] -  for contact info  [facility] - Office  [phone]

## 2019-03-17 NOTE — Telephone Encounter (Signed)
[place] schedule hospital f/u visit 4 months.

## 2019-03-17 NOTE — Discharge Instructions (Signed)
1)Avoid ibuprofen/Advil/Aleve/Motrin/Goody Powders/Naproxen/BC powders/Meloxicam/Diclofenac/Indomethacin and other Nonsteroidal anti-inflammatory medications as these will make you more likely to bleed and can cause stomach ulcers, can also cause Kidney problems.   2) change aspirin to 81 mg daily with food  3) you need repeat CBC and CMP blood test with the primary care physician in about a week or so  4) follow-up with Dr. Oneida Alar the gastroenterologist in 3 to 4 months

## 2019-03-20 ENCOUNTER — Telehealth: Payer: Self-pay | Admitting: Family Medicine

## 2019-03-20 DIAGNOSIS — E785 Hyperlipidemia, unspecified: Secondary | ICD-10-CM

## 2019-03-20 DIAGNOSIS — Z79899 Other long term (current) drug therapy: Secondary | ICD-10-CM

## 2019-03-20 NOTE — Telephone Encounter (Signed)
Orders put in and left message with pt to discuss with her and to schedule follow up

## 2019-03-20 NOTE — Telephone Encounter (Signed)
Pt was in hospital 12/15 - 12/18. The hospital told her to have her PCP order repeat labs. CBC, CMP and Lipid.

## 2019-03-20 NOTE — Telephone Encounter (Signed)
Ok do labs next mon, f u next wed or thur

## 2019-03-20 NOTE — Telephone Encounter (Signed)
Discussed with pt and she verbalized understanding and scheduled virtual appt next wednesday

## 2019-03-20 NOTE — Telephone Encounter (Signed)
Pt does not have a hospital follow up scheduled. Hospital note states to do bw in a week or so and discharged on the 18th. Do you want to order bw to do before her hospital follow up. I will call pt and let her know she needs visit with dr Richardson Landry for hosptial follow up

## 2019-03-21 NOTE — Telephone Encounter (Signed)
PATIENT ON RECALL FOR [facility] FOLLOW UP

## 2019-03-21 NOTE — Telephone Encounter (Signed)
Noted  

## 2019-03-28 LAB — CBC WITH DIFFERENTIAL/PLATELET
Basophils Absolute: 0.1 10*3/uL (ref 0.0–0.2)
Basos: 1 %
EOS (ABSOLUTE): 0.2 10*3/uL (ref 0.0–0.4)
Eos: 3 %
Hematocrit: 28.7 % — ABNORMAL LOW (ref 34.0–46.6)
Hemoglobin: 9.2 g/dL — ABNORMAL LOW (ref 11.1–15.9)
Immature Grans (Abs): 0 10*3/uL (ref 0.0–0.1)
Immature Granulocytes: 0 %
Lymphocytes Absolute: 1.3 10*3/uL (ref 0.7–3.1)
Lymphs: 18 %
MCH: 28.5 pg (ref 26.6–33.0)
MCHC: 32.1 g/dL (ref 31.5–35.7)
MCV: 89 fL (ref 79–97)
Monocytes Absolute: 0.6 10*3/uL (ref 0.1–0.9)
Monocytes: 9 %
Neutrophils Absolute: 4.8 10*3/uL (ref 1.4–7.0)
Neutrophils: 69 %
Platelets: 376 10*3/uL (ref 150–450)
RBC: 3.23 x10E6/uL — ABNORMAL LOW (ref 3.77–5.28)
RDW: 15.8 % — ABNORMAL HIGH (ref 11.7–15.4)
WBC: 7 10*3/uL (ref 3.4–10.8)

## 2019-03-28 LAB — COMPREHENSIVE METABOLIC PANEL
ALT: 19 IU/L (ref 0–32)
AST: 18 IU/L (ref 0–40)
Albumin/Globulin Ratio: 2 (ref 1.2–2.2)
Albumin: 4.5 g/dL (ref 3.8–4.8)
Alkaline Phosphatase: 76 IU/L (ref 39–117)
BUN/Creatinine Ratio: 9 — ABNORMAL LOW (ref 12–28)
BUN: 12 mg/dL (ref 8–27)
Bilirubin Total: 0.3 mg/dL (ref 0.0–1.2)
CO2: 24 mmol/L (ref 20–29)
Calcium: 9.8 mg/dL (ref 8.7–10.3)
Chloride: 102 mmol/L (ref 96–106)
Creatinine, Ser: 1.37 mg/dL — ABNORMAL HIGH (ref 0.57–1.00)
GFR calc Af Amer: 48 mL/min/{1.73_m2} — ABNORMAL LOW (ref >59)
GFR calc non Af Amer: 41 mL/min/{1.73_m2} — ABNORMAL LOW (ref >59)
Globulin, Total: 2.3 g/dL (ref 1.5–4.5)
Glucose: 110 mg/dL — ABNORMAL HIGH (ref 65–99)
Potassium: 4.4 mmol/L (ref 3.5–5.2)
Sodium: 144 mmol/L (ref 134–144)
Total Protein: 6.8 g/dL (ref 6.0–8.5)

## 2019-03-28 LAB — LIPID PANEL
Chol/HDL Ratio: 2.4 ratio (ref 0.0–4.4)
Cholesterol, Total: 152 mg/dL (ref 100–199)
HDL: 64 mg/dL (ref >39)
LDL Chol Calc (NIH): 66 mg/dL (ref 0–99)
Triglycerides: 124 mg/dL (ref 0–149)
VLDL Cholesterol Cal: 22 mg/dL (ref 5–40)

## 2019-03-29 ENCOUNTER — Other Ambulatory Visit: Payer: Self-pay

## 2019-03-29 ENCOUNTER — Other Ambulatory Visit (HOSPITAL_COMMUNITY): Payer: Self-pay | Admitting: *Deleted

## 2019-03-29 ENCOUNTER — Ambulatory Visit (INDEPENDENT_AMBULATORY_CARE_PROVIDER_SITE_OTHER): Payer: Commercial Managed Care - PPO | Admitting: Family Medicine

## 2019-03-29 DIAGNOSIS — I10 Essential (primary) hypertension: Secondary | ICD-10-CM | POA: Diagnosis not present

## 2019-03-29 DIAGNOSIS — N1831 Chronic kidney disease, stage 3a: Secondary | ICD-10-CM

## 2019-03-29 DIAGNOSIS — D500 Iron deficiency anemia secondary to blood loss (chronic): Secondary | ICD-10-CM

## 2019-03-29 NOTE — Progress Notes (Signed)
   Subjective:    Patient ID: Deanna [place], female    DOB: 11-02-1955, 63 y.o.   MRN: [id number]  HPI Pt was in [place] from 03/14/2019-03/17/2019 for rectal bleeding. Pt had EGD done by Dr.Fields while in hospital.  Pt had lab work completed on Monday also. Pt states she gets tested every 2-3 months to see how her iron is and if it is low she gets an infusion. Pt states that the hospitalist increased Famotidine to BID and decreased Aspirin to 81 mg.   Virtual Visit via Telephone Note  I connected with [place] on 03/29/19 at  1:10 PM EST by telephone and verified that I am speaking with the correct person using two identifiers.  Location: Patient: home Provider: office   I discussed the limitations, risks, security and privacy concerns of performing an evaluation and management service by telephone and the availability of in person appointments. I also discussed with the patient that there may be a patient responsible charge related to this service. The patient expressed understanding and agreed to proceed.   History of Present Illness:    Observations/Objective:   Assessment and Plan:   Follow Up Instructions:    I discussed the assessment and treatment plan with the patient. The patient was provided an opportunity to ask questions and all were answered. The patient agreed with the plan and demonstrated an understanding of the instructions.   The patient was advised to call back or seek an in-person evaluation if the symptoms worsen or if the condition fails to improve as anticipated.  25 minutes of non-face-to-face time during this encounter.  Patient's complete hospital record and discharge summary are reviewed at the time of discharge.  She presented with GI bleeding.  GI folks managed her and will be following her up as an outpatient.  Patient's blood work did reveal worsening of renal function in the hospital, along with acute loss of blood.  The hospitalist  appropriately recommended follow-up CBC and met 7 at an interval after discharge.  Patient also chronically receives iron infusions via the hematology clinic for chronic iron deficiency  Patient reports overall she is feeling stronger, but still not up to her usual energy level   Vicente Males, LPN  Review of Systems No headache no chest pain no shortness of breath    Objective:   Physical Exam   Virtual     Assessment & Plan:  Impression status post GI bleed secondary to angiodysplasia.  Known cirrhosis along with chronic esophagitis/gastritis.  Patient also has an element of history of bleeding via the small intestine.  2.  Iron deficiency anemia exacerbated by this bleed.  3.  Chronic kidney disease.  Stage III await follow-up level  Patient to follow-up with GI and hematology.

## 2019-04-03 ENCOUNTER — Inpatient Hospital Stay (HOSPITAL_COMMUNITY): Payer: Commercial Managed Care - PPO | Attending: Hematology

## 2019-04-03 ENCOUNTER — Other Ambulatory Visit: Payer: Self-pay

## 2019-04-03 DIAGNOSIS — M1990 Unspecified osteoarthritis, unspecified site: Secondary | ICD-10-CM | POA: Insufficient documentation

## 2019-04-03 DIAGNOSIS — K5900 Constipation, unspecified: Secondary | ICD-10-CM | POA: Insufficient documentation

## 2019-04-03 DIAGNOSIS — Z87891 Personal history of nicotine dependence: Secondary | ICD-10-CM | POA: Insufficient documentation

## 2019-04-03 DIAGNOSIS — Z8719 Personal history of other diseases of the digestive system: Secondary | ICD-10-CM | POA: Diagnosis not present

## 2019-04-03 DIAGNOSIS — F329 Major depressive disorder, single episode, unspecified: Secondary | ICD-10-CM | POA: Insufficient documentation

## 2019-04-03 DIAGNOSIS — K31811 Angiodysplasia of stomach and duodenum with bleeding: Secondary | ICD-10-CM | POA: Diagnosis not present

## 2019-04-03 DIAGNOSIS — D500 Iron deficiency anemia secondary to blood loss (chronic): Secondary | ICD-10-CM | POA: Insufficient documentation

## 2019-04-03 DIAGNOSIS — Z836 Family history of other diseases of the respiratory system: Secondary | ICD-10-CM | POA: Diagnosis not present

## 2019-04-03 DIAGNOSIS — I129 Hypertensive chronic kidney disease with stage 1 through stage 4 chronic kidney disease, or unspecified chronic kidney disease: Secondary | ICD-10-CM | POA: Diagnosis not present

## 2019-04-03 DIAGNOSIS — K625 Hemorrhage of anus and rectum: Secondary | ICD-10-CM | POA: Insufficient documentation

## 2019-04-03 DIAGNOSIS — Z79899 Other long term (current) drug therapy: Secondary | ICD-10-CM | POA: Insufficient documentation

## 2019-04-03 DIAGNOSIS — Z8673 Personal history of transient ischemic attack (TIA), and cerebral infarction without residual deficits: Secondary | ICD-10-CM | POA: Insufficient documentation

## 2019-04-03 DIAGNOSIS — R197 Diarrhea, unspecified: Secondary | ICD-10-CM | POA: Insufficient documentation

## 2019-04-03 DIAGNOSIS — Z8249 Family history of ischemic heart disease and other diseases of the circulatory system: Secondary | ICD-10-CM | POA: Diagnosis not present

## 2019-04-03 DIAGNOSIS — Z888 Allergy status to other drugs, medicaments and biological substances status: Secondary | ICD-10-CM | POA: Diagnosis not present

## 2019-04-03 DIAGNOSIS — N183 Chronic kidney disease, stage 3 unspecified: Secondary | ICD-10-CM | POA: Insufficient documentation

## 2019-04-03 DIAGNOSIS — K219 Gastro-esophageal reflux disease without esophagitis: Secondary | ICD-10-CM | POA: Insufficient documentation

## 2019-04-03 DIAGNOSIS — Z881 Allergy status to other antibiotic agents status: Secondary | ICD-10-CM | POA: Diagnosis not present

## 2019-04-03 DIAGNOSIS — Z8379 Family history of other diseases of the digestive system: Secondary | ICD-10-CM | POA: Diagnosis not present

## 2019-04-03 DIAGNOSIS — K7030 Alcoholic cirrhosis of liver without ascites: Secondary | ICD-10-CM | POA: Diagnosis not present

## 2019-04-03 LAB — CBC WITH DIFFERENTIAL/PLATELET
Abs Immature Granulocytes: 0.01 10*3/uL (ref 0.00–0.07)
Basophils Absolute: 0.1 10*3/uL (ref 0.0–0.1)
Basophils Relative: 1 %
Eosinophils Absolute: 0.1 10*3/uL (ref 0.0–0.5)
Eosinophils Relative: 1 %
HCT: 29.3 % — ABNORMAL LOW (ref 36.0–46.0)
Hemoglobin: 9.1 g/dL — ABNORMAL LOW (ref 12.0–15.0)
Immature Granulocytes: 0 %
Lymphocytes Relative: 25 %
Lymphs Abs: 1.7 10*3/uL (ref 0.7–4.0)
MCH: 28.1 pg (ref 26.0–34.0)
MCHC: 31.1 g/dL (ref 30.0–36.0)
MCV: 90.4 fL (ref 80.0–100.0)
Monocytes Absolute: 0.5 10*3/uL (ref 0.1–1.0)
Monocytes Relative: 8 %
Neutro Abs: 4.3 10*3/uL (ref 1.7–7.7)
Neutrophils Relative %: 65 %
Platelets: 359 10*3/uL (ref 150–400)
RBC: 3.24 MIL/uL — ABNORMAL LOW (ref 3.87–5.11)
RDW: 17.3 % — ABNORMAL HIGH (ref 11.5–15.5)
WBC: 6.6 10*3/uL (ref 4.0–10.5)
nRBC: 0 % (ref 0.0–0.2)

## 2019-04-03 LAB — COMPREHENSIVE METABOLIC PANEL
ALT: 31 U/L (ref 0–44)
AST: 29 U/L (ref 15–41)
Albumin: 4.5 g/dL (ref 3.5–5.0)
Alkaline Phosphatase: 66 U/L (ref 38–126)
Anion gap: 12 (ref 5–15)
BUN: 18 mg/dL (ref 8–23)
CO2: 23 mmol/L (ref 22–32)
Calcium: 9.2 mg/dL (ref 8.9–10.3)
Chloride: 101 mmol/L (ref 98–111)
Creatinine, Ser: 1.25 mg/dL — ABNORMAL HIGH (ref 0.44–1.00)
GFR calc Af Amer: 53 mL/min — ABNORMAL LOW (ref >60)
GFR calc non Af Amer: 46 mL/min — ABNORMAL LOW (ref >60)
Glucose, Bld: 112 mg/dL — ABNORMAL HIGH (ref 70–99)
Potassium: 3.9 mmol/L (ref 3.5–5.1)
Sodium: 136 mmol/L (ref 135–145)
Total Bilirubin: 0.7 mg/dL (ref 0.3–1.2)
Total Protein: 7.8 g/dL (ref 6.5–8.1)

## 2019-04-03 LAB — IRON AND TIBC
Iron: 31 ug/dL (ref 28–170)
Saturation Ratios: 6 % — ABNORMAL LOW (ref 10.4–31.8)
TIBC: 550 ug/dL — ABNORMAL HIGH (ref 250–450)
UIBC: 519 ug/dL

## 2019-04-03 LAB — FERRITIN: Ferritin: 4 ng/mL — ABNORMAL LOW (ref 11–307)

## 2019-04-03 LAB — FOLATE: Folate: 8.4 ng/mL (ref >5.9)

## 2019-04-04 ENCOUNTER — Encounter (HOSPITAL_COMMUNITY): Payer: Self-pay | Admitting: Nurse Practitioner

## 2019-04-04 ENCOUNTER — Inpatient Hospital Stay (HOSPITAL_COMMUNITY): Payer: Commercial Managed Care - PPO

## 2019-04-04 ENCOUNTER — Inpatient Hospital Stay (HOSPITAL_BASED_OUTPATIENT_CLINIC_OR_DEPARTMENT_OTHER): Payer: Commercial Managed Care - PPO | Admitting: Nurse Practitioner

## 2019-04-04 VITALS — BP 104/52 | HR 71 | Temp 97.1°F | Resp 18

## 2019-04-04 DIAGNOSIS — D500 Iron deficiency anemia secondary to blood loss (chronic): Secondary | ICD-10-CM

## 2019-04-04 MED ORDER — SODIUM CHLORIDE 0.9 % IV SOLN: Freq: Once | INTRAVENOUS | Status: AC

## 2019-04-04 MED ORDER — SODIUM CHLORIDE 0.9 % IV SOLN
200.0000 mg | Freq: Once | INTRAVENOUS | Status: AC
  Administered 2019-04-04: 200 mg via INTRAVENOUS
  Filled 2019-04-04: qty 200

## 2019-04-04 NOTE — Patient Instructions (Signed)
[facility]  Discharge Instructions:   _______________________________________________________________  Thank you for choosing [facility] at [facility] to provide your oncology and hematology care.  To afford each patient quality time with our providers, please arrive at least 15 minutes before your scheduled appointment.  You need to re-schedule your appointment if you arrive 10 or more minutes late.  We strive to give you quality time with our providers, and arriving late affects you and other patients whose appointments are after yours.  Also, if you no show three or more times for appointments you may be dismissed from the clinic.  Again, thank you for choosing [facility] at [facility]. Our hope is that these requests will allow you access to exceptional care and in a timely manner. _______________________________________________________________  If you have questions after your visit, please contact our office at [phone] between the hours of 8:30 a.m. and 5:00 p.m. Voicemails left after 4:30 p.m. will not be returned until the following business day. _______________________________________________________________  For prescription refill requests, have your pharmacy contact our office. _______________________________________________________________  Recommendations made by the consultant and any test results will be sent to your referring physician. _______________________________________________________________ 

## 2019-04-04 NOTE — Progress Notes (Signed)
[place], [place] [address]   CLINIC:  Medical Oncology/Hematology  PCP:  Mikey Kirschner, [place] [address] [phone]   REASON FOR VISIT: Follow-up for iron deficiency anemia  CURRENT THERAPY: Intermittent iron infusions  INTERVAL HISTORY:  [name] 64 y.o. female returns for routine follow-up for iron deficiency anemia.  Patient reports she was in the hospital since her last visit.  She had a GI bleed.  She reports her stools were black and positive for blood. Denies any nausea, vomiting, or diarrhea. Denies any new pains. Had not noticed any recent bleeding such as epistaxis, hematuria or hematochezia. Denies recent chest pain on exertion, shortness of breath on minimal exertion, pre-syncopal episodes, or palpitations. Denies any numbness or tingling in hands or feet. Denies any recent fevers, infections, or recent hospitalizations. Patient reports appetite at 100% and energy level at 75%.  She is eating well maintain her weight at this time.    REVIEW OF SYSTEMS:  Review of Systems  Gastrointestinal: Positive for constipation and diarrhea.  Psychiatric/Behavioral: Positive for depression.  All other systems reviewed and are negative.    PAST MEDICAL/SURGICAL HISTORY:  Past Medical History:  Diagnosis Date  . Abdominal hernia    repaired  . Abscess of pulp of tooth 12/12/14   left side  . Anxiety   . Arthritis    back  and down flank  right side more than left  . B12 deficiency 06/12/2015  . Brain aneurysm    METAL COIL PREVENTS MRI IMAGING and CLIP  . Cirrhosis ([place])    Presumably alcoholic cirrhosis  . Complication of anesthesia   . Depression   . ETOH abuse    Quit in 07/2011   . Fatigue    due to Sjogren's disease  . Folate deficiency 06/30/2016  . GERD (gastroesophageal reflux disease)   . HTN (hypertension)    no longer medicated, dated on 04/25/12  . IBS (irritable bowel syndrome)    . Iron deficiency anemia due to chronic blood loss   . Myofascial pain   . Panic attack   . PONV (postoperative nausea and vomiting)   . Psoriatic arthritis ([place])   . Right inguinal hernia 08/16/2012  . Seizures ([place]) 1997   1 seizure from coil insertion but no more seizures and no meds for seizures  . Sjogren's disease ([place])   . [place] HEAD 02/03/2007   Qualifier: Diagnosis of  By: Aline Brochure MD, Dorothyann Peng    . TIA (transient ischemic attack)   . 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([place])   . Vertigo    Past Surgical History:  Procedure Laterality Date  . AGILE CAPSULE N/A 06/21/2014   Procedure: AGILE CAPSULE;  Surgeon: Danie Binder, MD;  [facility];  Service: Endoscopy;  Laterality: N/A;  0700  . BIOPSY  10/13/2011   SLF: mild gastritis/duodenal polypoid lesion   . CEREBRAL ANEURYSM REPAIR  1997  . CHOLECYSTECTOMY N/A 10/28/2018   Procedure: LAPAROSCOPIC CHOLECYSTECTOMY WITH INTRAOPERATIVE CHOLANGIOGRAM;  Surgeon: Virl Cagey, MD;  Location: [facility] ORS;  Service: General;  Laterality: N/A;  . COLONOSCOPY  10/13/2011   [id number] hemorrhoids/varices rectal/lesion in ascending colon(HYPERPLASTIC POLYP)  . COLONOSCOPY N/A 01/17/2013   SLF: 1. Internal hemorrhoids 2. Ascending colon polyps 3.  Rectal varices.   . COLONOSCOPY N/A 08/10/2016   Dr. Oneida Alar: redundant left colon. external and internal hemorhroids. 5-10 year surveillance  . ESOPHAGOGASTRODUODENOSCOPY N/A 06/29/2014  SLF: 1. anemia due to polypoid lesions in teh antrum, and duodenal polyps 2. single duodenum AVM  . ESOPHAGOGASTRODUODENOSCOPY (EGD) WITH PROPOFOL N/A 03/07/2019   Procedure: ESOPHAGOGASTRODUODENOSCOPY (EGD) WITH PROPOFOL;  Surgeon: Danie Binder, MD;  [facility];  Service: Endoscopy;  Laterality: N/A;  1:00pm  . ESOPHAGOGASTRODUODENOSCOPY (EGD) WITH PROPOFOL N/A 03/16/2019   Procedure: ESOPHAGOGASTRODUODENOSCOPY (EGD) WITH PROPOFOL;  Surgeon: Danie Binder, MD;   [facility];  Service: Endoscopy;  Laterality: N/A;  . FLEXIBLE SIGMOIDOSCOPY N/A 01/21/2016   Procedure: FLEXIBLE SIGMOIDOSCOPY;  Surgeon: Danie Binder, MD;  [facility];  Service: Endoscopy;  Laterality: N/A;  245  . GIVENS CAPSULE STUDY N/A 06/29/2014   Procedure: GIVENS CAPSULE STUDY;  Surgeon: Danie Binder, MD;  [facility];  Service: Endoscopy;  Laterality: N/A;  . GIVENS CAPSULE STUDY N/A 07/13/2014   Procedure: GIVENS CAPSULE STUDY;  Surgeon: Danie Binder, MD;  [facility];  Service: Endoscopy;  Laterality: N/A;  700  . HEMORRHOID BANDING N/A 01/21/2016   Procedure: HEMORRHOID BANDING;  Surgeon: Danie Binder, MD;  [facility];  Service: Endoscopy;  Laterality: N/A;  . HEMOSTASIS CLIP PLACEMENT  03/16/2019   Procedure: HEMOSTASIS CLIP PLACEMENT;  Surgeon: Danie Binder, MD;  [facility];  Service: Endoscopy;;  x 4  . HEMOSTASIS CONTROL  03/16/2019   Procedure: HEMOSTASIS CONTROL;  Surgeon: Danie Binder, MD;  [facility];  Service: Endoscopy;;  injection with epinephine 0.1mg /ml via scope and bipolar cautery with gold probe  . INGUINAL HERNIA REPAIR Right 08/28/2013   Procedure: RIGHT NGUINAL HERNIORRHAPHY WITH MESH;  Surgeon: Jamesetta So, MD;  Location: [facility];  Service: General;  Laterality: Right;  . INSERTION OF MESH N/A 06/09/2013   Procedure: INSERTION OF MESH;  Surgeon: Jamesetta So, MD;  Location: [facility];  Service: General;  Laterality: N/A;  . INSERTION OF MESH Right 08/28/2013   Procedure: INSERTION OF MESH;  Surgeon: Jamesetta So, MD;  Location: [facility];  Service: General;  Laterality: Right;  . PARACENTESIS  Feb 2014   10.7 liters  . TOOTH EXTRACTION Left 12/12/14  . UMBILICAL HERNIA REPAIR N/A 06/09/2013   Procedure: UMBILICAL HERNIORRHAPHY WITH MESH;  Surgeon: Jamesetta So, MD;  Location: [facility];  Service: General;  Laterality: N/A;     SOCIAL HISTORY:  Social History    Socioeconomic History  . Marital status: Married    Spouse name: Kris Mouton  . Number of children: 0  . Years of education: Not on file  . Highest education level: Not on file  Occupational History    Employer: RETIRED  Tobacco Use  . Smoking status: Former Smoker    Packs/day: 2.00    Years: 12.00    Pack years: 24.00    Types: Cigarettes    Quit date: 03/31/1995    Years since quitting: 24.0  . Smokeless tobacco: Never Used  . Tobacco comment: quit in 1997  Substance and Sexual Activity  . Alcohol use: No    Comment: Bottle of wine a day for years, but quit in 07/2011  . Drug use: No  . Sexual activity: Not Currently    Birth control/protection: Post-menopausal  Other Topics Concern  . Not on file  Social History Narrative   Lives   Caffeine use:    Quit drinking alcohol 3 YEARS ago. No drugs, no IVDU.    Social Determinants of Health  Financial Resource Strain:   . Difficulty of Paying Living Expenses: Not on file  Food Insecurity:   . Worried About Charity fundraiser in the Last Year: Not on file  . Ran Out of Food in the Last Year: Not on file  Transportation Needs:   . Lack of Transportation (Medical): Not on file  . Lack of Transportation (Non-Medical): Not on file  Physical Activity:   . Days of Exercise per Week: Not on file  . Minutes of Exercise per Session: Not on file  Stress:   . Feeling of Stress : Not on file  Social Connections:   . Frequency of Communication with Friends and Family: Not on file  . Frequency of Social Gatherings with Friends and Family: Not on file  . Attends Religious Services: Not on file  . Active Member of Clubs or Organizations: Not on file  . Attends Archivist Meetings: Not on file  . Marital Status: Not on file  Intimate Partner Violence:   . Fear of Current or Ex-Partner: Not on file  . Emotionally Abused: Not on file  . Physically Abused: Not on file  . Sexually Abused: Not on file    FAMILY HISTORY:   Family History  Problem Relation Age of Onset  . Cirrhosis Mother        nash  . COPD Father   . Heart disease Father   . Asthma Other   . Heart disease Other   . Colon cancer Neg Hx   . Inflammatory bowel disease Neg Hx     CURRENT MEDICATIONS:  Outpatient Encounter Medications as of 04/04/2019  Medication Sig  . aspirin EC 81 MG tablet Take 1 tablet (81 mg total) by mouth daily with breakfast.  . atorvastatin (LIPITOR) 40 MG tablet Take 1 tablet (40 mg total) by mouth daily.  . bumetanide (BUMEX) 1 MG tablet Take 1 tablet (1 mg total) by mouth 2 (two) times daily.  Marland Kitchen buPROPion (WELLBUTRIN SR) 100 MG 12 hr tablet TAKE (1) TABLET BY MOUTH TWICE DAILY. (Patient taking differently: Take 100 mg by mouth 2 (two) times daily. )  . cholecalciferol (VITAMIN D3) 25 MCG (1000 UT) tablet Take 2 tablets (2,000 Units total) by mouth daily. (Patient taking differently: Take 1,000 Units by mouth every morning. )  . escitalopram (LEXAPRO) 20 MG tablet TAKE (1) TABLET BY MOUTH AT BEDTIME. (Patient taking differently: Take 20 mg by mouth at bedtime. )  . famotidine (PEPCID) 20 MG tablet Take 1 tablet (20 mg total) by mouth 2 (two) times daily before a meal.  . Lifitegrast (XIIDRA) 5 % SOLN Place 1 drop into both eyes 2 (two) times daily.  Marland Kitchen linaclotide (LINZESS) 145 MCG CAPS capsule Take 145 mcg by mouth daily before breakfast.  . losartan (COZAAR) 25 MG tablet Take 1 tablet (25 mg total) by mouth daily.  . Multiple Vitamin (MULTIVITAMIN WITH MINERALS) TABS tablet Take 1 tablet by mouth daily.  Marland Kitchen spironolactone (ALDACTONE) 100 MG tablet Take 0.5 tablets (50 mg total) by mouth 2 (two) times daily.  . Tofacitinib Citrate ER (XELJANZ XR) 11 MG TB24 Take 11 mg by mouth daily.   . Camphor-Menthol-Methyl Sal (SALONPAS) 3.04-04-08 % PTCH Place 1 patch onto the skin daily as needed (pain.).  Marland Kitchen fluticasone (FLONASE) 50 MCG/ACT nasal spray Place 1 spray into both nostrils See admin instructions. Instill 1 spray into  each nostril every morning, may also use later in the day as needed for sinus congestion  .  loratadine (CLARITIN) 10 MG tablet Take 10 mg by mouth daily.  . Polyvinyl Alcohol-Povidone (REFRESH OP) Place 1 drop into both eyes 3 (three) times daily as needed (dry eyes).  . Simethicone (GAS-X PO) Take 1 tablet by mouth 2 (two) times daily as needed (bloating/flatulence).  . sodium chloride (OCEAN) 0.65 % nasal spray Place 1 spray into the nose 2 (two) times daily as needed for congestion.    No facility-administered encounter medications on file as of 04/04/2019.    ALLERGIES:  Allergies  Allergen Reactions  . Omeprazole Shortness Of Breath  . Orencia [Abatacept] Anaphylaxis, Hives and Rash  . Other Shortness Of Breath and Rash    Red Meat Causes shortness of breath, rash and flu like symptoms.   . Phenytoin Rash  . Keflex [Cephalexin] Other (See Comments)    Severe yeast infection  . Lidocaine Viscous Hcl     NAUSEA/PROLONGED NUMBNESS  . Phenergan [Promethazine Hcl] Other (See Comments)    Not in right state of mind, HALLUCINATION     PHYSICAL EXAM:  ECOG Performance status: 1  Vitals:   04/04/19 1305 04/04/19 1322  BP: (!) 113/54 (!) 113/54  Pulse: 83 83  Resp: 18 18  Temp: (!) 97.5 F (36.4 C) (!) 97.5 F (36.4 C)  SpO2: 100% 100%   Filed Weights   04/04/19 1305 04/04/19 1322  Weight: 243 lb 14.4 oz (110.6 kg) 243 lb 14.4 oz (110.6 kg)    Physical Exam Constitutional:      Appearance: Normal appearance. She is normal weight.  Cardiovascular:     Rate and Rhythm: Normal rate and regular rhythm.     Heart sounds: Normal heart sounds.  Pulmonary:     Effort: Pulmonary effort is normal.     Breath sounds: Normal breath sounds.  Abdominal:     General: Bowel sounds are normal.     Palpations: Abdomen is soft.  Musculoskeletal:        General: Normal range of motion.  Skin:    General: Skin is warm.  Neurological:     Mental Status: She is alert and oriented to  person, place, and time. Mental status is at baseline.  Psychiatric:        Mood and Affect: Mood normal.        Behavior: Behavior normal.        Thought Content: Thought content normal.        Judgment: Judgment normal.      LABORATORY DATA:  I have reviewed the labs as listed.  CBC    Component Value Date/Time   WBC 6.6 04/03/2019 1303   RBC 3.24 (L) 04/03/2019 1303   HGB 9.1 (L) 04/03/2019 1303   HGB 9.2 (L) 03/27/2019 0854   HCT 29.3 (L) 04/03/2019 1303   HCT 28.7 (L) 03/27/2019 0854   HCT 41 07/30/2011 0743   PLT 359 04/03/2019 1303   PLT 376 03/27/2019 0854   MCV 90.4 04/03/2019 1303   MCV 89 03/27/2019 0854   MCV 104.3 07/30/2011 0743   MCH 28.1 04/03/2019 1303   MCHC 31.1 04/03/2019 1303   RDW 17.3 (H) 04/03/2019 1303   RDW 15.8 (H) 03/27/2019 0854   LYMPHSABS 1.7 04/03/2019 1303   LYMPHSABS 1.3 03/27/2019 0854   MONOABS 0.5 04/03/2019 1303   EOSABS 0.1 04/03/2019 1303   EOSABS 0.2 03/27/2019 0854   BASOSABS 0.1 04/03/2019 1303   BASOSABS 0.1 03/27/2019 0854   CMP Latest Ref Rng & Units 04/03/2019 03/27/2019 03/17/2019  Glucose 70 - 99 mg/dL 112(H) 110(H) 106(H)  BUN 8 - 23 mg/dL 18 12 14   Creatinine 0.44 - 1.00 mg/dL 1.25(H) 1.37(H) 1.23(H)  Sodium 135 - 145 mmol/L 136 144 139  Potassium 3.5 - 5.1 mmol/L 3.9 4.4 4.3  Chloride 98 - 111 mmol/L 101 102 107  CO2 22 - 32 mmol/L 23 24 24   Calcium 8.9 - 10.3 mg/dL 9.2 9.8 9.0  Total Protein 6.5 - 8.1 g/dL 7.8 6.8 -  Total Bilirubin 0.3 - 1.2 mg/dL 0.7 0.3 -  Alkaline Phos 38 - 126 U/L 66 76 -  AST 15 - 41 U/L 29 18 -  ALT 0 - 44 U/L 31 19 -   I personally performed a face-to-face visit.  All questions were answered to patient's stated satisfaction. Encouraged patient to call with any new concerns or questions before his next visit to the cancer center and we can certain see him sooner, if needed.     ASSESSMENT & PLAN:   Iron deficiency anemia due to chronic blood loss 1.  Iron deficiency anemia: - Due  to chronic blood loss secondary to chronic GI blood loss. 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  Also showed polypoid lesions in the duodenum, single duodenum AVM. - Her colonoscopy on 01/17/2013 showed rectal varices, polyps and hemorrhoids. - She had a flex sigmoidoscopy with Dr. Oneida Alar on 01/21/2016 showing rectal bleeding due to proctitis and possibly internal hemorrhoids. -She last had Feraheme on 12/23/2018. -She had her gallbladder removed September 2020 due to a lodged stone and inflammation.  Per the patient she lost about a unit of blood at that time. -Patient was recently hospitalized in December 2020 for an AVM bleed where her hemoglobin dropped to 7.7. 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  Injected.  Treated with bipolar cautery.  Clips were placed. -Labs done on 04/03/2019 showed hemoglobin has improved to 9.1, ferritin 4, percent saturation 6. -Patient will receive four doses of Venofer IV.  She will receive the first dose today. - She will follow-up in 3 months with repeat labs.  2.  Chronic kidney disease: -Creatinine is 1.25. -We will continue to monitor.      Orders placed this encounter:  Orders Placed This Encounter  Procedures  . Lactate dehydrogenase  . CBC with Differential/Platelet  . Comprehensive metabolic panel  . Ferritin  . Iron and TIBC  . Vitamin B12  . Vitamin D 25 hydroxy  . Folate      Francene Finders, FNP-C [place] [phone]

## 2019-04-04 NOTE — Assessment & Plan Note (Addendum)
1.  Iron deficiency anemia: - Due to chronic blood loss secondary to chronic GI blood loss. 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  Also showed polypoid lesions in the duodenum, single duodenum AVM. - Her colonoscopy on 01/17/2013 showed rectal varices, polyps and hemorrhoids. - She had a flex sigmoidoscopy with Dr. Oneida Alar on 01/21/2016 showing rectal bleeding due to proctitis and possibly internal hemorrhoids. -She last had Feraheme on 12/23/2018. -She had her gallbladder removed September 2020 due to a lodged stone and inflammation.  Per the patient she lost about a unit of blood at that time. -Patient was recently hospitalized in December 2020 for an AVM bleed where her hemoglobin dropped to 7.7. 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  Injected.  Treated with bipolar cautery.  Clips were placed. -Labs done on 04/03/2019 showed hemoglobin has improved to 9.1, ferritin 4, percent saturation 6. -Patient will receive four doses of Venofer IV.  She will receive the first dose today. - She will follow-up in 3 months with repeat labs.  2.  Chronic kidney disease: -Creatinine is 1.25. -We will continue to monitor.

## 2019-04-04 NOTE — Progress Notes (Signed)
Patient presents today for Venofer infusion. MAR reviewed. Patient has no complaints of pain or any changes since the last visit.   Venofer given today per MD orders. Tolerated infusion without adverse affects. Vital signs stable. No complaints at this time. Discharged from clinic ambulatory. F/U with [facility] as scheduled.

## 2019-04-04 NOTE — Patient Instructions (Signed)
[facility] Discharge Instructions  Follow up in 3 months with lab s   Thank you for choosing [facility] at [facility] to provide your oncology and hematology care.  To afford each patient quality time with our provider, please arrive at least 15 minutes before your scheduled appointment time.   If you have a lab appointment with the Cancer Center please come in thru the Main Entrance and check in at the main information desk.  You need to re-schedule your appointment should you arrive 10 or more minutes late.  We strive to give you quality time with our providers, and arriving late affects you and other patients whose appointments are after yours.  Also, if you no show three or more times for appointments you may be dismissed from the clinic at the providers discretion.     Again, thank you for choosing [facility].  Our hope is that these requests will decrease the amount of time that you wait before being seen by our physicians.       _____________________________________________________________  Should you have questions after your visit to [facility], please contact our office at [phone] between the hours of 8:00 a.m. and 4:30 p.m.  Voicemails left after 4:00 p.m. will not be returned until the following business day.  For prescription refill requests, have your pharmacy contact our office and allow 72 hours.    Due to Covid, you will need to wear a mask upon entering the hospital. If you do not have a mask, a mask will be given to you at the Main Entrance upon arrival. For doctor visits, patients may have 1 support person with them. For treatment visits, patients can not have anyone with them due to social distancing guidelines and our immunocompromised population.      

## 2019-04-06 ENCOUNTER — Other Ambulatory Visit: Payer: Self-pay

## 2019-04-07 ENCOUNTER — Encounter (HOSPITAL_COMMUNITY): Payer: Self-pay

## 2019-04-07 ENCOUNTER — Inpatient Hospital Stay (HOSPITAL_COMMUNITY): Payer: Commercial Managed Care - PPO

## 2019-04-07 VITALS — BP 108/64 | HR 76 | Resp 18

## 2019-04-07 DIAGNOSIS — D500 Iron deficiency anemia secondary to blood loss (chronic): Secondary | ICD-10-CM

## 2019-04-07 MED ORDER — SODIUM CHLORIDE 0.9 % IV SOLN: Freq: Once | INTRAVENOUS | Status: AC

## 2019-04-07 MED ORDER — SODIUM CHLORIDE 0.9 % IV SOLN
200.0000 mg | Freq: Once | INTRAVENOUS | Status: AC
  Administered 2019-04-07: 200 mg via INTRAVENOUS
  Filled 2019-04-07: qty 200

## 2019-04-07 MED ORDER — SODIUM CHLORIDE 0.9% FLUSH
10.0000 mL | Freq: Once | INTRAVENOUS | Status: AC | PRN
  Administered 2019-04-07: 10 mL

## 2019-04-07 NOTE — Progress Notes (Signed)
Patient tolerated iron infusion with no complaints voiced.  Peripheral IV site clean and dry with good blood return noted before and after infusion.  Band aid applied.  VSS with discharge and left ambulatory with no s/s of distress noted.  

## 2019-04-10 ENCOUNTER — Telehealth: Payer: Commercial Managed Care - PPO | Admitting: Family

## 2019-04-10 ENCOUNTER — Ambulatory Visit (HOSPITAL_COMMUNITY): Payer: Commercial Managed Care - PPO

## 2019-04-10 DIAGNOSIS — Z20822 Contact with and (suspected) exposure to covid-19: Secondary | ICD-10-CM

## 2019-04-10 MED ORDER — BENZONATATE 100 MG PO CAPS: 100.0000 mg | ORAL_CAPSULE | Freq: Three times a day (TID) | ORAL | 0 refills | Status: DC | PRN

## 2019-04-10 NOTE — Progress Notes (Signed)
E-Visit for Corona Virus Screening   Your current symptoms could be consistent with the coronavirus.  Many health care providers can now test patients at their office but not all are.  [place] has multiple testing sites. For information on our [place] testing locations and hours go to [email or web address]  We are enrolling you in our [place] for [place] . Daily you will receive a questionnaire within the [place] website. Our COVID 19 response team will be monitoring your responses daily.  Testing Information: The COVID-19 Community Testing sites will begin testing BY APPOINTMENT ONLY.  You can schedule online at [email or web address]  If you do not have access to a smart phone or computer you may call [phone] for an appointment.  Testing Locations: Appointment schedule is 8 am to 3:30 pm at all sites  [facility] indoors at [address] [place] [address] [facility]  indoors at [place]. [address], [place], [place] [address] [place] indoors at [address], [place] [address]  Additional testing sites in the Community:  . For [facility] Testing sites in [facility]  FaceUpdate.uy  . For Pop-up testing sites in [place]  BowlDirectory.co.uk  . For Testing sites with regular hours [email or web address].[email or web address]  . For [place] [email or web address]  . For [facility] [email or web address]  . For [facility] testing in [place] and [employer] [email or web address]  . For [facility] testing in [facility]   [email or web address].[email or web address]  For  more  information about community testing call 8073110020   We are enrolling you in our [place] for [place] . Daily you will receive a questionnaire within the [place] website. Our COVID 19 response team will be monitoring your responses daily.  Please quarantine yourself while awaiting your test results. If you develop fever/cough/breathlessness, please stay home for 10 days with improving symptoms and until you have had 24 hours of no fever (without taking a fever reducer).  You should wear a mask or cloth face covering over your nose and mouth if you must be around other people or animals, including pets (even at home). Try to stay at least 6 feet away from other people. This will protect the people around you.  Please continue good preventive care measures, including:  frequent hand-washing, avoid touching your face, cover coughs/sneezes, stay out of crowds and keep a 6 foot distance from others.  COVID-19 is a respiratory illness with symptoms that are similar to the flu. Symptoms are typically mild to moderate, but there have been cases of severe illness and death due to the virus.   The following symptoms may appear 2-14 days after exposure: . Fever . Cough . Shortness of breath or difficulty breathing . Chills . Repeated shaking with chills . Muscle pain . Headache . Sore throat . New loss of taste or smell . Fatigue . Congestion or runny nose . Nausea or vomiting . Diarrhea  Go to the nearest hospital ED for assessment if fever/cough/breathlessness are severe or illness seems like a threat to life.  It is vitally important that if you feel that you have an infection such as this virus or any other virus that you stay home and away from places where you may spread it to others.  You should avoid contact with people age 63 and older.   You can use medication such as A prescription cough medication called Tessalon Perles 100 mg. You may take 1-2 capsules every 8 hours as  needed for cough.  You may also take acetaminophen (Tylenol) as needed for fever.  Reduce your risk of any infection by using the same precautions used for avoiding the common cold or flu:  Marland Kitchen Wash your hands often with soap and warm water for at least 20 seconds.  If soap and water are not readily available, use an alcohol-based hand sanitizer with at least 60% alcohol.  . If coughing or sneezing, cover your mouth and nose by coughing or sneezing into the elbow areas of your shirt or coat, into a tissue or into your sleeve (not your hands). . Avoid shaking hands with others and consider head nods or verbal greetings only. . Avoid touching your eyes, nose, or mouth with unwashed hands.  . Avoid close contact with people who are sick. . Avoid places or events with large numbers of people in one location, like concerts or sporting events. . Carefully consider travel plans you have or are making. . If you are planning any travel outside or inside the [place], visit the CDC's Travelers' Health webpage for the latest health notices. . If you have some symptoms but not all symptoms, continue to monitor at home and seek medical attention if your symptoms worsen. . If you are having a medical emergency, call 911.  HOME CARE . Only take medications as instructed by your medical team. . Drink plenty of fluids and get plenty of rest. . A steam or ultrasonic humidifier can help if you have congestion.   GET HELP RIGHT AWAY IF YOU HAVE EMERGENCY WARNING SIGNS** FOR COVID-19. If you or someone is showing any of these signs seek emergency medical care immediately. Call 911 or proceed to your closest emergency facility if: . You develop worsening high fever. . Trouble breathing . Bluish lips or face . Persistent pain or pressure in the chest . New confusion . Inability to wake or stay awake . You cough up blood. . Your symptoms become more severe  **This list is not all possible symptoms. Contact your  medical provider for any symptoms that are sever or concerning to you.  MAKE SURE YOU   Understand these instructions.  Will watch your condition.  Will get help right away if you are not doing well or get worse.  Your e-visit answers were reviewed by a board certified advanced clinical practitioner to complete your personal care plan.  Depending on the condition, your plan could have included both over the counter or prescription medications.  If there is a problem please reply once you have received a response from your provider.  Your safety is important to [place].  If you have drug allergies check your prescription carefully.    You can use MyChart to ask questions about today's visit, request a non-urgent call back, or ask for a work or school excuse for 24 hours related to this e-Visit. If it has been greater than 24 hours you will need to follow up with your provider, or enter a new e-Visit to address those concerns. You will get an e-mail in the next two days asking about your experience.  I hope that your e-visit has been valuable and will speed your recovery. Thank you for using e-visits.  Approximately 5 minutes was spent documenting and reviewing patient's chart.

## 2019-04-11 ENCOUNTER — Encounter (INDEPENDENT_AMBULATORY_CARE_PROVIDER_SITE_OTHER): Payer: Self-pay

## 2019-04-12 ENCOUNTER — Ambulatory Visit: Payer: Commercial Managed Care - PPO | Attending: Internal Medicine

## 2019-04-12 ENCOUNTER — Encounter (INDEPENDENT_AMBULATORY_CARE_PROVIDER_SITE_OTHER): Payer: Self-pay

## 2019-04-12 ENCOUNTER — Other Ambulatory Visit: Payer: Self-pay

## 2019-04-12 DIAGNOSIS — Z20822 Contact with and (suspected) exposure to covid-19: Secondary | ICD-10-CM

## 2019-04-13 ENCOUNTER — Encounter (INDEPENDENT_AMBULATORY_CARE_PROVIDER_SITE_OTHER): Payer: Self-pay

## 2019-04-13 LAB — NOVEL CORONAVIRUS, NAA: SARS-CoV-2, NAA: DETECTED — AB

## 2019-04-14 ENCOUNTER — Other Ambulatory Visit: Payer: Self-pay | Admitting: Nurse Practitioner

## 2019-04-14 ENCOUNTER — Telehealth: Payer: Self-pay | Admitting: *Deleted

## 2019-04-14 ENCOUNTER — Encounter (INDEPENDENT_AMBULATORY_CARE_PROVIDER_SITE_OTHER): Payer: Self-pay

## 2019-04-14 ENCOUNTER — Other Ambulatory Visit: Payer: Self-pay | Admitting: Family Medicine

## 2019-04-14 ENCOUNTER — Other Ambulatory Visit: Payer: Self-pay

## 2019-04-14 ENCOUNTER — Ambulatory Visit (HOSPITAL_COMMUNITY): Payer: Commercial Managed Care - PPO

## 2019-04-14 ENCOUNTER — Ambulatory Visit (INDEPENDENT_AMBULATORY_CARE_PROVIDER_SITE_OTHER): Payer: Commercial Managed Care - PPO | Admitting: Family Medicine

## 2019-04-14 DIAGNOSIS — U071 COVID-19: Secondary | ICD-10-CM | POA: Diagnosis not present

## 2019-04-14 DIAGNOSIS — Z79899 Other long term (current) drug therapy: Secondary | ICD-10-CM

## 2019-04-14 DIAGNOSIS — I10 Essential (primary) hypertension: Secondary | ICD-10-CM

## 2019-04-14 NOTE — Progress Notes (Signed)
  I connected by phone with Deanna Thompson on 04/14/2019 at 2:24 PM to discuss the potential use of an new treatment for mild to moderate COVID-19 viral infection in non-hospitalized patients.  This patient is a 64 y.o. female that meets the FDA criteria for Emergency Use Authorization of bamlanivimab or casirivimab\imdevimab.  Has a (+) direct SARS-CoV-2 viral test result  Has mild or moderate COVID-19   Is ? 63 years of age and weighs ? 40 kg  Is NOT hospitalized due to COVID-19  Is NOT requiring oxygen therapy or requiring an increase in baseline oxygen flow rate due to COVID-19  Is within 10 days of symptom onset  Has at least one of the high risk factor(s) for progression to severe COVID-19 and/or hospitalization as defined in EUA.  Specific high risk criteria : Hypertension   I have spoken and communicated the following to the patient or parent/caregiver:  1. FDA has authorized the emergency use of bamlanivimab and casirivimab\imdevimab for the treatment of mild to moderate COVID-19 in adults and pediatric patients with positive results of direct SARS-CoV-2 viral testing who are 66 years of age and older weighing at least 40 kg, and who are at high risk for progressing to severe COVID-19 and/or hospitalization.  2. The significant known and potential risks and benefits of bamlanivimab and casirivimab\imdevimab, and the extent to which such potential risks and benefits are unknown.  3. Information on available alternative treatments and the risks and benefits of those alternatives, including clinical trials.  4. Patients treated with bamlanivimab and casirivimab\imdevimab should continue to self-isolate and use infection control measures (e.g., wear mask, isolate, social distance, avoid sharing personal items, clean and disinfect "high touch" surfaces, and frequent handwashing) according to CDC guidelines.   5. The patient or parent/caregiver has the option to accept or refuse  bamlanivimab or casirivimab\imdevimab .  After reviewing this information with the patient, The patient agreed to proceed with receiving the bamlanimivab infusion and will be provided a copy of the Fact sheet prior to receiving the infusion.[name] 04/14/2019 2:24 PM

## 2019-04-14 NOTE — Progress Notes (Signed)
   Subjective:    Patient ID: Deanna Thompson, female    DOB: 1955/12/21, 64 y.o.   MRN: [id number]  HPI Pt tested positive for COVID yesterday. Pt has been sleeping a lot, running a low grade fever and very achy. Pt has been taking Tylenol. She relates low-grade fever achiness congestion denies any wheezing or difficulty breathing O2 saturation at 99% energy level is low feels very fatigued appetite fair patient states she has been sleeping more than normal denies being confused denies passing out Virtual Visit via Telephone Note  I connected with [place] on 04/14/19 at  3:50 PM EST by telephone and verified that I am speaking with the correct person using two identifiers.  Location: Patient: home Provider: office   I discussed the limitations, risks, security and privacy concerns of performing an evaluation and management service by telephone and the availability of in person appointments. I also discussed with the patient that there may be a patient responsible charge related to this service. The patient expressed understanding and agreed to proceed.   History of Present Illness:    Observations/Objective:   Assessment and Plan:   Follow Up Instructions:    I discussed the assessment and treatment plan with the patient. The patient was provided an opportunity to ask questions and all were answered. The patient agreed with the plan and demonstrated an understanding of the instructions.   The patient was advised to call back or seek an in-person evaluation if the symptoms worsen or if the condition fails to improve as anticipated.  I provided 16 minutes of non-face-to-face time during this encounter.       Review of Systems  Constitutional: Negative for activity change and fever.  HENT: Positive for congestion and rhinorrhea. Negative for ear pain.   Eyes: Negative for discharge.  Respiratory: Negative for cough, shortness of breath and wheezing.     Cardiovascular: Negative for chest pain.       Objective:   Physical Exam  Today's visit was via telephone Physical exam was not possible for this visit       Assessment & Plan:  Covid infection Patient has some serious underlying conditions including obesity and cirrhosis She is at higher risk for complications So far no sign of complications her illness started this past Saturday so she is technically on day 6 heading into day 7 We will go ahead and send her in name to the infusion center for their consideration for this patient hopefully they will reach out to her today or tomorrow  Patient was educated regarding warning signs regarding Covid and what to watch for  I touch base with the patient on Saturday O2 sats around 97% feels fatigued tired coughing congested but denies shortness of breath with activity has infusion set up for Monday warning signs were discussed with the patient

## 2019-04-14 NOTE — Telephone Encounter (Signed)
Patient called after receiving BPA for worsening appetite via [place]. Patient states when she feels sick she does loose her appetite but has been able to stay hydrated. No nausea. Pt also had visit with PCP today. Pt advised to stay hydrated as much as possible and to notify PCP of any changes or worsening symptoms. Pt verbalized understanding.

## 2019-04-17 ENCOUNTER — Ambulatory Visit (HOSPITAL_COMMUNITY)
Admission: RE | Admit: 2019-04-17 | Discharge: 2019-04-17 | Disposition: A | Discharge status: Home / Self Care | Payer: Commercial Managed Care - PPO | Source: Ambulatory Visit | Attending: Family Medicine | Admitting: Family Medicine

## 2019-04-17 DIAGNOSIS — Z23 Encounter for immunization: Secondary | ICD-10-CM | POA: Insufficient documentation

## 2019-04-17 DIAGNOSIS — I10 Essential (primary) hypertension: Secondary | ICD-10-CM

## 2019-04-17 DIAGNOSIS — U071 COVID-19: Secondary | ICD-10-CM | POA: Diagnosis present

## 2019-04-17 MED ORDER — METHYLPREDNISOLONE SODIUM SUCC 125 MG IJ SOLR: 125.0000 mg | Freq: Once | INTRAMUSCULAR | Status: DC | PRN

## 2019-04-17 MED ORDER — DIPHENHYDRAMINE HCL 50 MG/ML IJ SOLN: 50.0000 mg | Freq: Once | INTRAMUSCULAR | Status: DC | PRN

## 2019-04-17 MED ORDER — ALBUTEROL SULFATE HFA 108 (90 BASE) MCG/ACT IN AERS: 2.0000 | INHALATION_SPRAY | Freq: Once | RESPIRATORY_TRACT | Status: DC | PRN

## 2019-04-17 MED ORDER — SODIUM CHLORIDE 0.9 % IV SOLN
INTRAVENOUS | Status: DC | PRN
  Administered 2019-04-17: 250 mL via INTRAVENOUS

## 2019-04-17 MED ORDER — EPINEPHRINE 0.3 MG/0.3ML IJ SOAJ: 0.3000 mg | Freq: Once | INTRAMUSCULAR | Status: DC | PRN

## 2019-04-17 MED ORDER — FAMOTIDINE IN NACL 20-0.9 MG/50ML-% IV SOLN: 20.0000 mg | Freq: Once | INTRAVENOUS | Status: DC | PRN

## 2019-04-17 MED ORDER — SODIUM CHLORIDE 0.9 % IV SOLN
700.0000 mg | Freq: Once | INTRAVENOUS | Status: AC
  Administered 2019-04-17: 700 mg via INTRAVENOUS
  Filled 2019-04-17: qty 20

## 2019-04-17 NOTE — Discharge Instructions (Signed)

## 2019-04-17 NOTE — Progress Notes (Signed)
Patient ID: [name], female   DOB: 02-09-1956, 64 y.o.   MRN: [id number]  Diagnosis: [id number]  Physician:  Procedure: Covid Infusion Clinic Med: bamlanivimab infusion - Provided patient with bamlanimivab fact sheet for patients, parents and caregivers prior to infusion.  Complications: No immediate complications noted.  Discharge: Discharged home   Heide Scales 04/17/2019

## 2019-04-18 ENCOUNTER — Encounter (INDEPENDENT_AMBULATORY_CARE_PROVIDER_SITE_OTHER): Payer: Self-pay

## 2019-04-19 ENCOUNTER — Encounter (INDEPENDENT_AMBULATORY_CARE_PROVIDER_SITE_OTHER): Payer: Self-pay

## 2019-04-20 ENCOUNTER — Telehealth: Payer: Self-pay | Admitting: *Deleted

## 2019-04-20 ENCOUNTER — Other Ambulatory Visit: Payer: Self-pay

## 2019-04-20 ENCOUNTER — Encounter (INDEPENDENT_AMBULATORY_CARE_PROVIDER_SITE_OTHER): Payer: Self-pay

## 2019-04-20 ENCOUNTER — Encounter: Payer: Self-pay | Admitting: Family Medicine

## 2019-04-20 ENCOUNTER — Ambulatory Visit (INDEPENDENT_AMBULATORY_CARE_PROVIDER_SITE_OTHER): Payer: Commercial Managed Care - PPO | Admitting: Family Medicine

## 2019-04-20 DIAGNOSIS — U071 COVID-19: Secondary | ICD-10-CM

## 2019-04-20 MED ORDER — ONDANSETRON 4 MG PO TBDP: ORAL_TABLET | ORAL | 0 refills | Status: DC

## 2019-04-20 MED ORDER — ALPRAZOLAM 0.5 MG PO TABS: ORAL_TABLET | ORAL | 0 refills | Status: DC

## 2019-04-20 NOTE — Progress Notes (Signed)
   Subjective:  [place]  Patient ID: Deanna Thompson, female    DOB: [birth year], 64 y.o.   MRN: [id number]  HPI Pt tested positive for COVID last Wednesday January 13. Pt is having nausea, tightness in chest, fatigue, cough and drainage. Pt is taking Claritin, Famotidine. Pt had Zofran from an issue last year but it has expired.   Virtual Visit via Telephone Note  I connected with  on 04/20/19 at  3:50 PM EST by telephone and verified that I am speaking with the correct person using two identifiers.  Location: Patient: home Provider: office   I discussed the limitations, risks, security and privacy concerns of performing an evaluation and management service by telephone and the availability of in person appointments. I also discussed with the patient that there may be a patient responsible charge related to this service. The patient expressed understanding and agreed to proceed.   History of Present Illness:    Observations/Objective:   Assessment and Plan:   Follow Up Instructions:    I discussed the assessment and treatment plan with the patient. The patient was provided an opportunity to ask questions and all were answered. The patient agreed with the plan and demonstrated an understanding of the instructions.   The patient was advised to call back or seek an in-person evaluation if the symptoms worsen or if the condition fails to improve as anticipated.  I provided 20 minutes of non-face-to-face time during this encounter.  Patient had a monoclonal antibody intervention.  Overall there is no major change with it.  Having substantial nausea.  No nonexpired medications to address this.  Reporting a lot of ongoing fatigue with persistent congestion.  Frustrating to the patient.   Also a lot of difficulty getting to sleep at night.  Is taking low-dose Xanax from her husband with benefit.  Would like to have some herself. Review of Systems No headache no  chest pain no shortness of breath    Objective:   Physical Exam   Virtual     Assessment & Plan:  Impression COVID-19 infection persistent symptoms overall though some improvement and stability.  Discussed.  Xanax prescribed.  Insomnia.  Will add Zofran for nausea warning signs discussed carefully.  O2 sats remained good

## 2019-04-20 NOTE — Telephone Encounter (Signed)
Returned call to patient regarding her questionnaire. Pt stated that she had worst appetite because of sinus drainage whichis causing her to be nauseated and not able to eat. She had notified her pcp and is waiting on medication to be called in for nausea. No other complaints at this time.

## 2019-04-21 ENCOUNTER — Ambulatory Visit (HOSPITAL_COMMUNITY): Payer: Commercial Managed Care - PPO

## 2019-04-22 ENCOUNTER — Encounter (INDEPENDENT_AMBULATORY_CARE_PROVIDER_SITE_OTHER): Payer: Self-pay

## 2019-04-23 ENCOUNTER — Encounter (INDEPENDENT_AMBULATORY_CARE_PROVIDER_SITE_OTHER): Payer: Self-pay

## 2019-04-24 ENCOUNTER — Telehealth: Payer: Self-pay

## 2019-04-24 ENCOUNTER — Other Ambulatory Visit: Payer: Self-pay | Admitting: Gastroenterology

## 2019-04-24 NOTE — Telephone Encounter (Signed)
I received a notice that Linzess 72 mcg was not covered.  Alternatives may include Trulance or Amitiza 24 mcg.  Pt would like the Linzess if it will be approved, since Linzess 145 mcg works but just a little too strong.  I am working on the PA for the [place] and having a problem in Cover My meds.  I had to leave a Vm for them to call me back.

## 2019-04-28 ENCOUNTER — Encounter (HOSPITAL_COMMUNITY): Payer: Self-pay

## 2019-04-28 ENCOUNTER — Inpatient Hospital Stay (HOSPITAL_COMMUNITY): Payer: Commercial Managed Care - PPO

## 2019-04-28 ENCOUNTER — Other Ambulatory Visit: Payer: Self-pay

## 2019-04-28 VITALS — BP 114/86 | HR 69 | Temp 96.9°F | Resp 18

## 2019-04-28 DIAGNOSIS — D500 Iron deficiency anemia secondary to blood loss (chronic): Secondary | ICD-10-CM

## 2019-04-28 MED ORDER — SODIUM CHLORIDE 0.9 % IV SOLN
200.0000 mg | Freq: Once | INTRAVENOUS | Status: AC
  Administered 2019-04-28: 200 mg via INTRAVENOUS
  Filled 2019-04-28: qty 200

## 2019-04-28 MED ORDER — SODIUM CHLORIDE 0.9 % IV SOLN: Freq: Once | INTRAVENOUS | Status: AC

## 2019-04-28 NOTE — Progress Notes (Signed)
Patient presents today for Venofer infusion. No complaints of any pain today. Patient states she was positive for Covid and quarantined process is complete.

## 2019-04-28 NOTE — Patient Instructions (Signed)
[facility] Discharge Instructions for Patients Receiving Chemotherapy  Today you received the following chemotherapy agents   To help prevent nausea and vomiting after your treatment, we encourage you to take your nausea medication   If you develop nausea and vomiting that is not controlled by your nausea medication, call the clinic.   BELOW ARE SYMPTOMS THAT SHOULD BE REPORTED IMMEDIATELY:  *FEVER GREATER THAN 100.5 F  *CHILLS WITH OR WITHOUT FEVER  NAUSEA AND VOMITING THAT IS NOT CONTROLLED WITH YOUR NAUSEA MEDICATION  *UNUSUAL SHORTNESS OF BREATH  *UNUSUAL BRUISING OR BLEEDING  TENDERNESS IN MOUTH AND THROAT WITH OR WITHOUT PRESENCE OF ULCERS  *URINARY PROBLEMS  *BOWEL PROBLEMS  UNUSUAL RASH Items with * indicate a potential emergency and should be followed up as soon as possible.  Feel free to call the clinic should you have any questions or concerns. The clinic phone number is [phone].  Please show the CHEMO ALERT CARD at check-in to the Emergency Department and triage nurse.   

## 2019-05-01 NOTE — Telephone Encounter (Addendum)
I received a fax that Linzess 72 mcg has been approved.  [id number].  It was approved for 12 months, through 04/25/2020.   Left message with pt's husband and he said she had just received notice.

## 2019-05-02 ENCOUNTER — Telehealth: Payer: Self-pay | Admitting: Gastroenterology

## 2019-05-02 NOTE — Telephone Encounter (Signed)
NEXT ULTRASOUND JUN 2021.

## 2019-05-02 NOTE — Telephone Encounter (Signed)
Please NIC [place] for 08/2019.

## 2019-05-02 NOTE — Telephone Encounter (Signed)
Dr. Oneida Alar, she had CT abd/pelvis without contrast 03/16/19. Does she need Ultrasound at this time?

## 2019-05-02 NOTE — Telephone Encounter (Signed)
Recall for ultrasound 

## 2019-05-02 NOTE — Telephone Encounter (Signed)
ON RECALL FOR June ULTRASOUND

## 2019-05-04 ENCOUNTER — Encounter: Payer: Self-pay | Admitting: Family Medicine

## 2019-05-05 ENCOUNTER — Other Ambulatory Visit: Payer: Self-pay

## 2019-05-05 ENCOUNTER — Inpatient Hospital Stay (HOSPITAL_COMMUNITY): Payer: Commercial Managed Care - PPO | Attending: Hematology

## 2019-05-05 ENCOUNTER — Encounter (HOSPITAL_COMMUNITY): Payer: Self-pay

## 2019-05-05 VITALS — BP 130/63 | HR 64 | Temp 96.8°F | Resp 18

## 2019-05-05 DIAGNOSIS — Z836 Family history of other diseases of the respiratory system: Secondary | ICD-10-CM | POA: Diagnosis not present

## 2019-05-05 DIAGNOSIS — Z8249 Family history of ischemic heart disease and other diseases of the circulatory system: Secondary | ICD-10-CM | POA: Diagnosis not present

## 2019-05-05 DIAGNOSIS — Z79899 Other long term (current) drug therapy: Secondary | ICD-10-CM | POA: Insufficient documentation

## 2019-05-05 DIAGNOSIS — K219 Gastro-esophageal reflux disease without esophagitis: Secondary | ICD-10-CM | POA: Diagnosis not present

## 2019-05-05 DIAGNOSIS — D500 Iron deficiency anemia secondary to blood loss (chronic): Secondary | ICD-10-CM | POA: Insufficient documentation

## 2019-05-05 DIAGNOSIS — N1831 Chronic kidney disease, stage 3a: Secondary | ICD-10-CM | POA: Insufficient documentation

## 2019-05-05 DIAGNOSIS — Z8379 Family history of other diseases of the digestive system: Secondary | ICD-10-CM | POA: Diagnosis not present

## 2019-05-05 DIAGNOSIS — I10 Essential (primary) hypertension: Secondary | ICD-10-CM | POA: Diagnosis not present

## 2019-05-05 DIAGNOSIS — Z87891 Personal history of nicotine dependence: Secondary | ICD-10-CM | POA: Diagnosis not present

## 2019-05-05 DIAGNOSIS — K922 Gastrointestinal hemorrhage, unspecified: Secondary | ICD-10-CM | POA: Diagnosis not present

## 2019-05-05 DIAGNOSIS — E538 Deficiency of other specified B group vitamins: Secondary | ICD-10-CM | POA: Insufficient documentation

## 2019-05-05 MED ORDER — SODIUM CHLORIDE 0.9 % IV SOLN: INTRAVENOUS | Status: DC

## 2019-05-05 MED ORDER — SODIUM CHLORIDE 0.9 % IV SOLN
200.0000 mg | Freq: Once | INTRAVENOUS | Status: AC
  Administered 2019-05-05: 200 mg via INTRAVENOUS
  Filled 2019-05-05: qty 200

## 2019-05-05 NOTE — Patient Instructions (Signed)
[facility] Discharge Instructions  Received Venofer infusion today. Follow-up as scheduled. Call clinic for any questions or concerns   Thank you for choosing [facility] at [facility] to provide your oncology and hematology care.  To afford each patient quality time with our provider, please arrive at least 15 minutes before your scheduled appointment time.   If you have a lab appointment with the Cancer Center please come in thru the Main Entrance and check in at the main information desk.  You need to re-schedule your appointment should you arrive 10 or more minutes late.  We strive to give you quality time with our providers, and arriving late affects you and other patients whose appointments are after yours.  Also, if you no show three or more times for appointments you may be dismissed from the clinic at the providers discretion.     Again, thank you for choosing [facility].  Our hope is that these requests will decrease the amount of time that you wait before being seen by our physicians.       _____________________________________________________________  Should you have questions after your visit to [facility], please contact our office at [phone] between the hours of 8:00 a.m. and 4:30 p.m.  Voicemails left after 4:00 p.m. will not be returned until the following business day.  For prescription refill requests, have your pharmacy contact our office and allow 72 hours.    Due to Covid, you will need to wear a mask upon entering the hospital. If you do not have a mask, a mask will be given to you at the Main Entrance upon arrival. For doctor visits, patients may have 1 support person with them. For treatment visits, patients can not have anyone with them due to social distancing guidelines and our immunocompromised population.     

## 2019-05-05 NOTE — Progress Notes (Signed)
[place] Snipe tolerated Venofer infusion well without complaints or incident. Peripheral IV site checked with positive blood return noted prior to and after infusion. VSS upon discharge. Pt discharged self ambulatory in satisfactory condition

## 2019-05-08 ENCOUNTER — Encounter: Payer: Self-pay | Admitting: Gastroenterology

## 2019-05-11 ENCOUNTER — Ambulatory Visit (INDEPENDENT_AMBULATORY_CARE_PROVIDER_SITE_OTHER): Payer: Commercial Managed Care - PPO | Admitting: Adult Health

## 2019-05-11 ENCOUNTER — Other Ambulatory Visit: Payer: Self-pay

## 2019-05-11 ENCOUNTER — Encounter: Payer: Self-pay | Admitting: Adult Health

## 2019-05-11 VITALS — BP 110/62 | HR 84 | Temp 96.5°F | Ht 68.0 in | Wt 220.0 lb

## 2019-05-11 DIAGNOSIS — H8113 Benign paroxysmal vertigo, bilateral: Secondary | ICD-10-CM | POA: Diagnosis not present

## 2019-05-11 DIAGNOSIS — I10 Essential (primary) hypertension: Secondary | ICD-10-CM

## 2019-05-11 DIAGNOSIS — Z8673 Personal history of transient ischemic attack (TIA), and cerebral infarction without residual deficits: Secondary | ICD-10-CM | POA: Diagnosis not present

## 2019-05-11 DIAGNOSIS — E785 Hyperlipidemia, unspecified: Secondary | ICD-10-CM | POA: Diagnosis not present

## 2019-05-11 NOTE — Progress Notes (Signed)
I agree with the above plan 

## 2019-05-11 NOTE — Progress Notes (Signed)
[facility] [address] [place]. [place] [address] [phone]       OFFICE FOLLOW UP NOTE  Deanna Thompson Date of Birth:  03/23/1956 Medical Record Number:  [id number]   Reason for Referral:  follow up [place] provider: Dr. Leonie Man PCP: Deanna Kirschner, MD      CHIEF COMPLAINT:  Chief Complaint  Patient presents with  . Follow-up    Rm9. alone. doing well no neurological concerns.        HPI:   Update 05/11/2019: Deanna Thompson is a 64 year old female who is being seen today for follow-up.  She was previously seen on 11/08/2018 for routine follow-up.  Since prior visit, she has been doing well with only occasional vertigo episodes with history of BPPV but greatly improved.  She did have episode of acute GI bleeding on 03/14/2019 with acute on chronic iron deficiency anemia.  Aspirin dosage decreased to 81 mg daily.  she denies any recurrent bleeding or bruising concerns.  Continues to follow with medical oncology/hematology for intermittent iron infusions. Continues on atorvastatin 40 mg daily without myalgias.  Blood pressure today 110/62.  Of note, positive Covid test on [id number] without complications.  No further concerns at this time.    History copied for reference purposes only 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 Patient does have a history of BPPV, which was short lasting. Patient came to ED with gradual onset of dizziness and then vertigo, nausea/vomiting, diaphoresis as well as binocular diplopia, lasting more than 4 hours and then gradually getting better. Upon admission, her examination benign. CT head reviewed and negative  for acute abnormality. CTA head neck showed known left MCA 3 mm aneurysm at bifurcation and left nondominant VA V2 stenosis.  2D echo showed EF of 45-50% (recommended outpatient cardiology follow-up).  A1c 5.8 and LDL 129.  Repeat CT scan was performed 24 hours later and again showed no acute intracranial abnormality.  MRI unable to be obtained due to aneurysm clip.   As patient was not on antithrombotic prior to admission, recommended to start aspirin 81 mg daily.  As LDL was elevated, it was recommended to start Lipitor 40 mg daily.  Patient does have a history of a 3 mm aneurysm is being followed by Dr. Estanislado Pandy.  History of alcoholic cirrhosis and recommended to be carefully monitored by GI with initiation of Lipitor and aspirin.  During admission, LFTs currently within normal limits.  With symptoms of vertigo, diplopia, and ataxia it was believed that these symptoms were more related to a small brainstem central event as BPPV is not typically associated with diplopia or ataxia lasting for extended amount of time, and vestibular neuritis does not have diplopia but normally lasting longer than this. It was unclear if this event was related to her basilar artery pathology on top of previous coiling and clipping but as a DSA with Dr. Estanislado Pandy was an option, it would not necessarily change management at this time and as her clinical symptoms improved it was decided to hold off on cerebral angiogram at this time. Patient was discharged home in stable condition.   Initial visit 09/02/2017: Patient is being seen today for hospital follow-up.  She has been continuing PT for vestibular  rehab and has been seeing improvement in her dizziness.  Occasionally she will have increased dizziness with increased visual problems but typically sees this greater when she has increased fatigue.  She states up until yesterday she was about 60% improved and then felt as though she took a slight decrease in improvement.  She does go to  our neuro rehab clinic 2 times per week.  Patient states that she feels fatigued frequently but is one that would rather try to push through her fatigue then rest at that time and does see worsening in symptoms.  She continues to take aspirin with mild bruising but no bleeding.  Continues to take Lipitor without side effects myalgias.  Blood pressure today satisfactory 119/69.  Patient will be going for liver ultrasound next week and does have liver enzyme testing done every 3 to 6 months.  Patient will also be following up with cardiology on 09/16/2017 for repeat echocardiogram for low EF.  Update 11/02/2017: Since previous visit, patient was seen at [facility] ED on 10/28/2017 due to complaints of vision changes.  She was unable to receive MRI due to coiling cerebral aneurysm and CT was negative for acute infarct.  She was diagnosed with possible TIA and recommended increase of aspirin from 81 mg to 325 mg.  Patient was discharged home in stable condition.   Patient was seen today for sooner appointment due to recent hospitalization for possible TIA.  Patient states since this hospitalization, she has had increase and "cluster symptoms".  She has been experiencing these since her stroke in 07/2017 but they have been increased since 10/28/2017.  She experiences these cluster of symptoms after scanning an area with her head/eyes from side to side and will feel a sensation of nausea, "puffy head" and feeling of being off balance.  She did have these on occasion previously but since 8/1 they have been happening daily and will last throughout the day.  She denies headaches or migraine history but does state she has chronic issues with her sinuses and these issues have been worse over the past 7 to 10 days.  She states she will have left eye pain that feels puffy, pain in her jaw and in the back of her neck.  She feels as though this is all related to her sinuses due to sinus stuffiness and postnasal drip sensation.  She  denies her "cluster symptoms" being associated with headache/migraine or vision changes.  Denies increased anxiety prior to the symptoms occurring.  She does state that she is been doing home exercises that were given to her after completing PT for BPPV but she will be restarting outpatient PT on 11/08/2017 to see if this will help with her increase in symptoms.  She also states that since her stroke in 07/2017 she will occasionally feel a "blip" in her vision but only lasts for a few seconds and then resolves.  She is unable to describe her exact symptoms but states it feels like a catching sensation but denies blurry vision or double vision.  She was seen in the ED after this occurring the night prior to due to waking up in the morning with slightly worse vision than the day prior.  Patient is concerned due to not knowing reasoning behind these continued symptoms and is frustrated that she is unable to control these symptoms.  During appointment, patient is very spastic with rapid speech and heightened anxiety.  It was recommended to increase aspirin 81 mg to 325 mg  but recommended for clearance from GI (due to history of cirrhosis) and neurology prior to doing so.  GI did clear for this increase and agree with this plan.  No further testing was done during recent hospitalization besides CT scan which was negative for acute infarct.  Update 01/03/2018.  She returns for follow-up after last visit with [place] practitioner 2 months ago.  She states she is actually doing better and has not had any further episodes of dizziness or room spinning.  She did benefit significantly by her spitting and vestibular rehabilitation and doing dizziness exercises.  She has had previous episodes of benign positional vertigo in the past and total of 4-5 such episodes so far.  She is denies any significant anxiety.  She has a history of a small 3 mm left MCA bifurcation aneurysm which is being followed conservatively by Dr.  Estanislado Pandy.  Her last CT angiogram was on 07/31/2017 which showed treated basilar apex aneurysm without any remnant of recurrence.  3 mm untreated left MCA bifurcation aneurysm which is unchanged from previous study.  She had been told at the last office visit by the nurse practitioner that her symptoms may have been related to underlying anxiety and she did not like this answer and wanted to see me for a second opinion.  11/08/2018 update: Deanna Thompson is a 64 year old female who is being seen today for follow-up.  She has been stable from a neurological standpoint.  She will have occasional vertigo episodes but will subside after doing vestibular exercises which were recommended during vestibular rehab.  She also mentioned infrequent episodes of difficulty with bilateral hand coordination but will only last for couple seconds.  Symptoms are typically happen during activity such as writing or holding objects.This is been occurring for the past 1 to 2 years and has only occurred maybe twice over the past 6 months.  She does state she will have symptoms occur more frequently over a 3 to 4-day period and then subside.  She denies weakness, numbness/tingling or pain.  Denies headache, increased stress or change of emotion at that time.  She is not interested in pursuing any type of work-up but just wanted to mention in case symptoms worsen.  She does have underlying history of depression/anxiety but has been stable and continues to follow with psychiatry.  She does endorse daytime fatigue despite adequate sleep at night.  She feels as though if she sits for any length of time, it will be easy for her to fall asleep.  She does endorse snoring.  She is on multiple medications and questions whether polypharmacy can be contributing.  She has not previously underwent sleep study.  She continues on aspirin 325 mg without bleeding or bruising.  Continues on atorvastatin 40 mg daily without myalgias.  Blood pressure today 127/77.   No further concerns at this time.      ROS:   14 system review of systems performed and negative with exception of vertigo, fatigue   PMH:  Past Medical History:  Diagnosis Date  . Abdominal hernia    repaired  . Abscess of pulp of tooth 12/12/14   left side  . Anxiety   . Arthritis    back  and down flank  right side more than left  . B12 deficiency 06/12/2015  . Brain aneurysm    METAL COIL PREVENTS MRI IMAGING and CLIP  . Cirrhosis ([place])    Presumably alcoholic cirrhosis  . Complication of anesthesia   .  Depression   . ETOH abuse    Quit in 07/2011   . Fatigue    due to Sjogren's disease  . Folate deficiency 06/30/2016  . GERD (gastroesophageal reflux disease)   . HTN (hypertension)    no longer medicated, dated on 04/25/12  . IBS (irritable bowel syndrome)   . Iron deficiency anemia due to chronic blood loss   . Myofascial pain   . Panic attack   . PONV (postoperative nausea and vomiting)   . Psoriatic arthritis ([place])   . Right inguinal hernia 08/16/2012  . Seizures ([place]) 1997   1 seizure from coil insertion but no more seizures and no meds for seizures  . Sjogren's disease ([place])   . 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 TIA (transient ischemic attack)   . 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([place])   . Vertigo     PSH:  Past Surgical History:  Procedure Laterality Date  . AGILE CAPSULE N/A 06/21/2014   Procedure: AGILE CAPSULE;  Surgeon: Danie Binder, MD;  [facility];  Service: Endoscopy;  Laterality: N/A;  0700  . BIOPSY  10/13/2011   SLF: mild gastritis/duodenal polypoid lesion   . CEREBRAL ANEURYSM REPAIR  1997  . CHOLECYSTECTOMY N/A 10/28/2018   Procedure: LAPAROSCOPIC CHOLECYSTECTOMY WITH INTRAOPERATIVE CHOLANGIOGRAM;  Surgeon: Virl Cagey, MD;  Location: [facility];  Service: General;  Laterality: N/A;  . COLONOSCOPY  10/13/2011   [id number] hemorrhoids/varices rectal/lesion  in ascending colon(HYPERPLASTIC POLYP)  . COLONOSCOPY N/A 01/17/2013   SLF: 1. Internal hemorrhoids 2. Ascending colon polyps 3.  Rectal varices.   . COLONOSCOPY N/A 08/10/2016   Dr. Oneida Alar: redundant left colon. external and internal hemorhroids. 5-10 year surveillance  . ESOPHAGOGASTRODUODENOSCOPY N/A 06/29/2014   SLF: 1. anemia due to polypoid lesions in teh antrum, and duodenal polyps 2. single duodenum AVM  . ESOPHAGOGASTRODUODENOSCOPY (EGD) WITH PROPOFOL N/A 03/07/2019   Procedure: ESOPHAGOGASTRODUODENOSCOPY (EGD) WITH PROPOFOL;  Surgeon: Danie Binder, MD;  [facility];  Service: Endoscopy;  Laterality: N/A;  1:00pm  . ESOPHAGOGASTRODUODENOSCOPY (EGD) WITH PROPOFOL N/A 03/16/2019   Procedure: ESOPHAGOGASTRODUODENOSCOPY (EGD) WITH PROPOFOL;  Surgeon: Danie Binder, MD;  [facility];  Service: Endoscopy;  Laterality: N/A;  . FLEXIBLE SIGMOIDOSCOPY N/A 01/21/2016   Procedure: FLEXIBLE SIGMOIDOSCOPY;  Surgeon: Danie Binder, MD;  [facility];  Service: Endoscopy;  Laterality: N/A;  245  . GIVENS CAPSULE STUDY N/A 06/29/2014   Procedure: GIVENS CAPSULE STUDY;  Surgeon: Danie Binder, MD;  [facility];  Service: Endoscopy;  Laterality: N/A;  . GIVENS CAPSULE STUDY N/A 07/13/2014   Procedure: GIVENS CAPSULE STUDY;  Surgeon: Danie Binder, MD;  [facility];  Service: Endoscopy;  Laterality: N/A;  700  . HEMORRHOID BANDING N/A 01/21/2016   Procedure: HEMORRHOID BANDING;  Surgeon: Danie Binder, MD;  [facility];  Service: Endoscopy;  Laterality: N/A;  . HEMOSTASIS CLIP PLACEMENT  03/16/2019   Procedure: HEMOSTASIS CLIP PLACEMENT;  Surgeon: Danie Binder, MD;  [facility];  Service: Endoscopy;;  x 4  . HEMOSTASIS CONTROL  03/16/2019   Procedure: HEMOSTASIS CONTROL;  Surgeon: Danie Binder, MD;  [facility];  Service: Endoscopy;;  injection with epinephine 0.1mg /ml via scope and bipolar cautery with  gold probe  . INGUINAL HERNIA REPAIR Right 08/28/2013   Procedure: RIGHT NGUINAL HERNIORRHAPHY WITH MESH;  Surgeon:  Jamesetta So, MD;  Location: [facility];  Service: General;  Laterality: Right;  . INSERTION OF MESH N/A 06/09/2013   Procedure: INSERTION OF MESH;  Surgeon: Jamesetta So, MD;  Location: [facility];  Service: General;  Laterality: N/A;  . INSERTION OF MESH Right 08/28/2013   Procedure: INSERTION OF MESH;  Surgeon: Jamesetta So, MD;  Location: [facility];  Service: General;  Laterality: Right;  . PARACENTESIS  Feb 2014   10.7 liters  . TOOTH EXTRACTION Left 12/12/14  . UMBILICAL HERNIA REPAIR N/A 06/09/2013   Procedure: UMBILICAL HERNIORRHAPHY WITH MESH;  Surgeon: Jamesetta So, MD;  Location: [facility];  Service: General;  Laterality: N/A;    Social History:  Social History   Socioeconomic History  . Marital status: Married    Spouse name: Kris Mouton  . Number of children: 0  . Years of education: Not on file  . Highest education level: Not on file  Occupational History    Employer: RETIRED  Tobacco Use  . Smoking status: Former Smoker    Packs/day: 2.00    Years: 12.00    Pack years: 24.00    Types: Cigarettes    Quit date: 03/31/1995    Years since quitting: 24.1  . Smokeless tobacco: Never Used  . Tobacco comment: quit in 1997  Substance and Sexual Activity  . Alcohol use: No    Comment: Bottle of wine a day for years, but quit in 07/2011  . Drug use: No  . Sexual activity: Not Currently    Birth control/protection: Post-menopausal  Other Topics Concern  . Not on file  Social History Narrative   Lives   Caffeine use:    Quit drinking alcohol 3 YEARS ago. No drugs, no IVDU.    Social Determinants of Health   Financial Resource Strain:   . Difficulty of Paying Living Expenses: Not on file  Food Insecurity:   . Worried About Charity fundraiser in the Last Year: Not on file  . Ran Out of Food in the Last Year: Not on file  Transportation Needs:   . Lack of  Transportation (Medical): Not on file  . Lack of Transportation (Non-Medical): Not on file  Physical Activity:   . Days of Exercise per Week: Not on file  . Minutes of Exercise per Session: Not on file  Stress:   . Feeling of Stress : Not on file  Social Connections:   . Frequency of Communication with Friends and Family: Not on file  . Frequency of Social Gatherings with Friends and Family: Not on file  . Attends Religious Services: Not on file  . Active Member of Clubs or Organizations: Not on file  . Attends Archivist Meetings: Not on file  . Marital Status: Not on file  Intimate Partner Violence:   . Fear of Current or Ex-Partner: Not on file  . Emotionally Abused: Not on file  . Physically Abused: Not on file  . Sexually Abused: Not on file    Family History:  Family History  Problem Relation Age of Onset  . Cirrhosis Mother        nash  . COPD Father   . Heart disease Father   . Asthma Other   . Heart disease Other   . Colon cancer Neg Hx   . Inflammatory bowel disease Neg Hx     Medications:   Current Outpatient Medications on File Prior to Visit  Medication Sig Dispense Refill  .  ALPRAZolam (XANAX) 0.5 MG tablet Take 1/2 tablet po qhs prn sleep 10 tablet 0  . aspirin EC 81 MG tablet Take 1 tablet (81 mg total) by mouth daily with breakfast. 30 tablet 2  . atorvastatin (LIPITOR) 40 MG tablet Take 1 tablet (40 mg total) by mouth daily. 30 tablet 5  . bumetanide (BUMEX) 1 MG tablet Take 1 tablet (1 mg total) by mouth 2 (two) times daily. 60 tablet 3  . buPROPion (WELLBUTRIN SR) 100 MG 12 hr tablet TAKE (1) TABLET BY MOUTH TWICE DAILY. (Patient taking differently: Take 100 mg by mouth 2 (two) times daily. ) 60 tablet 5  . Camphor-Menthol-Methyl Sal (SALONPAS) 3.04-04-08 % PTCH Place 1 patch onto the skin daily as needed (pain.).    Marland Kitchen cholecalciferol (VITAMIN D3) 25 MCG (1000 UT) tablet Take 2 tablets (2,000 Units total) by mouth daily. (Patient taking  differently: Take 1,000 Units by mouth every morning. ) 30 tablet 11  . escitalopram (LEXAPRO) 20 MG tablet TAKE (1) TABLET BY MOUTH AT BEDTIME. (Patient taking differently: Take 20 mg by mouth at bedtime. ) 30 tablet 5  . famotidine (PEPCID) 20 MG tablet Take 1 tablet (20 mg total) by mouth 2 (two) times daily before a meal. 60 tablet 5  . fluticasone (FLONASE) 50 MCG/ACT nasal spray Place 1 spray into both nostrils See admin instructions. Instill 1 spray into each nostril every morning, may also use later in the day as needed for sinus congestion    . Lifitegrast (XIIDRA) 5 % SOLN Place 1 drop into both eyes 2 (two) times daily.    Marland Kitchen LINZESS 145 MCG CAPS capsule TAKE (1) CAPSULE BY MOUTH EVERY DAY BEFORE BREAKFAST. 90 capsule 3  . loratadine (CLARITIN) 10 MG tablet Take 10 mg by mouth daily.    Marland Kitchen losartan (COZAAR) 25 MG tablet Take 1 tablet (25 mg total) by mouth daily. 30 tablet 5  . Multiple Vitamin (MULTIVITAMIN WITH MINERALS) TABS tablet Take 1 tablet by mouth daily.    . ondansetron (ZOFRAN ODT) 4 MG disintegrating tablet Place one tablet under tongue q 8 hours prn nausea 24 tablet 0  . Polyvinyl Alcohol-Povidone (REFRESH OP) Place 1 drop into both eyes 3 (three) times daily as needed (dry eyes).    . Simethicone (GAS-X PO) Take 1 tablet by mouth 2 (two) times daily as needed (bloating/flatulence).    . sodium chloride (OCEAN) 0.65 % nasal spray Place 1 spray into the nose 2 (two) times daily as needed for congestion.     Marland Kitchen spironolactone (ALDACTONE) 100 MG tablet Take 0.5 tablets (50 mg total) by mouth 2 (two) times daily. 30 tablet 4  . Tofacitinib Citrate ER (XELJANZ XR) 11 MG TB24 Take 11 mg by mouth daily.      No current facility-administered medications on file prior to visit.    Allergies:   Allergies  Allergen Reactions  . Omeprazole Shortness Of Breath  . Orencia [Abatacept] Anaphylaxis, Hives and Rash  . Other Shortness Of Breath and Rash    Red Meat Causes shortness of  breath, rash and flu like symptoms.   . Phenytoin Rash  . Keflex [Cephalexin] Other (See Comments)    Severe yeast infection  . Lidocaine Viscous Hcl     NAUSEA/PROLONGED NUMBNESS  . Phenergan [Promethazine Hcl] Other (See Comments)    Not in right state of mind, HALLUCINATION     Physical Exam  Vitals:   05/11/19 1239  BP: 110/62  Pulse: 84  Temp: Marland Kitchen)  96.5 F (35.8 C)  Weight: 220 lb (99.8 kg)  Height: 5\' 8"  (1.727 m)   Body mass index is 33.45 kg/m. No exam data present  General: Obese pleasant middle-age Caucasian female, seated, in no evident distress Head: head normocephalic and atraumatic.   Neck: supple with no carotid or supraclavicular bruits Cardiovascular: regular rate and rhythm, no murmurs Musculoskeletal: no deformity Skin:  no rash/petichiae Vascular:  Normal pulses all extremities  Neurologic Exam Mental Status: Awake and fully alert.  Normal speech and language during visit.  Oriented to place and time. Recent and remote memory intact.  Attention span, concentration and fund of knowledge appropriate. Mood and affect appropriate.  Cranial Nerves: Pupils equal, briskly reactive to light. Extraocular movements full without nystagmus. Visual fields full to confrontation. Hearing intact. Facial sensation intact. Face, tongue, palate moves normally and symmetrically.  Motor: Normal bulk and tone. Normal strength in all tested extremity muscles. Sensory.: intact to touch , pinprick , position and vibratory sensation.  Coordination: Rapid alternating movements normal in all extremities. Finger-to-nose and heel-to-shin performed accurately bilaterally.  No evidence of tremors or difficulty with hand coordination bilaterally. Gait and Station: Arises from chair without difficulty. Stance is normal. Gait demonstrates normal stride length and balance .  Reflexes: 1+ and symmetric. Toes downgoing.          ASSESSMENT: Deanna Thompson is a 64 y.o. year old female  here with possible right brainstem infarct on 07/31/2017. Vascular risk factors include HTN and HLD.  Patient returns today with possible TIA on 10/28/2017 after worsening in vision.  Recurrent episodes of transient positional vertigo likely benign paroxysmal positional vertigo with improvement of vestibular rehab.  She does endorse occasional vertigo symptoms but will subside after vestibular exercises.  Recent episode 03/14/2019 of acute GI bleed with acute on chronic iron deficiency anemia with aspirin dosage lowered.   PLAN: -Continue aspirin 81 mg daily and atorvastatin 40 mg daily for secondary stroke prevention -Previously referred to [place] sleep clinic due to ongoing excessive daytime fatigue.  She would like to hold off at this time with any further evaluations or testing and will call office in the future if interested in proceeding for possible sleep apnea evaluation -Advised to notify office with any worsening of BPPV with potential need of repeat vestibular rehab -Continue to follow with PCP for HTN and HLD management -Continue to follow with psychiatry for depression/anxiety management -maintain strict control of hypertension with blood pressure goal below 130/90 and lipids with LDL cholesterol goal below 70 mg percent.    Overall stable from stroke standpoint and recommend follow-up as needed   Greater than 50% of time during this 20 minute visit was spent on counseling,explanation of diagnosis of possible brainstem infarct and history of TIA,  BPPV and potential need in the future for repeating vestibular rehab,reviewing risk factor management of HLD and HTN, planning of further management, discussion with patient and family questions to satisfaction   Frann Rider, AGNP-BC  [facility] [address] [place], [place] [address]  Phone [phone] Fax [phone] Note: This document was prepared with digital dictation and possible smart phrase  technology. Any transcriptional errors that result from this process are unintentional.

## 2019-05-11 NOTE — Patient Instructions (Signed)
Continue aspirin 81 mg daily  and lipitor  for secondary stroke prevention  Continue to follow up with PCP regarding cholesterol and blood pressure management  Continue to monitor blood pressure at home  Maintain strict control of hypertension with blood pressure goal below 130/90, diabetes with hemoglobin A1c goal below 6.5% and cholesterol with LDL cholesterol (bad cholesterol) goal below 70 mg/dL. I also advised the patient to eat a healthy diet with plenty of whole grains, cereals, fruits and vegetables, exercise regularly and maintain ideal body weight.         Thank you for coming to see [place] at [facility]. I hope we have been able to provide you high quality care today.  You may receive a patient satisfaction survey over the next few weeks. We would appreciate your feedback and comments so that we may continue to improve ourselves and the health of our patients.

## 2019-05-31 ENCOUNTER — Other Ambulatory Visit: Payer: Self-pay | Admitting: Gastroenterology

## 2019-05-31 NOTE — Telephone Encounter (Signed)
Can we verify the dosage of spironolactone patient is taking.  For her last office visit, she was taking 1/2 tablet (50 mg) twice daily.  Looks like at her hospital discharge, the prescription was supposed to be for 1/2 tablet (50 mg) twice daily as well.

## 2019-05-31 NOTE — Telephone Encounter (Signed)
Lm on home and cell number. Waiting on a return call.  

## 2019-05-31 NOTE — Telephone Encounter (Signed)
Spoke with pt. Pt is taking Spironolactone 50 mg bid.

## 2019-06-13 ENCOUNTER — Other Ambulatory Visit: Payer: Self-pay

## 2019-06-13 ENCOUNTER — Inpatient Hospital Stay (HOSPITAL_COMMUNITY): Payer: Commercial Managed Care - PPO | Attending: Hematology

## 2019-06-13 DIAGNOSIS — K5900 Constipation, unspecified: Secondary | ICD-10-CM | POA: Insufficient documentation

## 2019-06-13 DIAGNOSIS — K589 Irritable bowel syndrome without diarrhea: Secondary | ICD-10-CM | POA: Diagnosis not present

## 2019-06-13 DIAGNOSIS — Z87891 Personal history of nicotine dependence: Secondary | ICD-10-CM | POA: Diagnosis not present

## 2019-06-13 DIAGNOSIS — Z79899 Other long term (current) drug therapy: Secondary | ICD-10-CM | POA: Diagnosis not present

## 2019-06-13 DIAGNOSIS — K922 Gastrointestinal hemorrhage, unspecified: Secondary | ICD-10-CM | POA: Diagnosis not present

## 2019-06-13 DIAGNOSIS — K219 Gastro-esophageal reflux disease without esophagitis: Secondary | ICD-10-CM | POA: Insufficient documentation

## 2019-06-13 DIAGNOSIS — D500 Iron deficiency anemia secondary to blood loss (chronic): Secondary | ICD-10-CM

## 2019-06-13 DIAGNOSIS — R5383 Other fatigue: Secondary | ICD-10-CM | POA: Diagnosis not present

## 2019-06-13 DIAGNOSIS — Z836 Family history of other diseases of the respiratory system: Secondary | ICD-10-CM | POA: Insufficient documentation

## 2019-06-13 DIAGNOSIS — Z8379 Family history of other diseases of the digestive system: Secondary | ICD-10-CM | POA: Insufficient documentation

## 2019-06-13 DIAGNOSIS — Z8249 Family history of ischemic heart disease and other diseases of the circulatory system: Secondary | ICD-10-CM | POA: Diagnosis not present

## 2019-06-13 DIAGNOSIS — I10 Essential (primary) hypertension: Secondary | ICD-10-CM | POA: Insufficient documentation

## 2019-06-13 DIAGNOSIS — N189 Chronic kidney disease, unspecified: Secondary | ICD-10-CM | POA: Insufficient documentation

## 2019-06-13 DIAGNOSIS — D509 Iron deficiency anemia, unspecified: Secondary | ICD-10-CM | POA: Insufficient documentation

## 2019-06-13 LAB — COMPREHENSIVE METABOLIC PANEL
ALT: 32 U/L (ref 0–44)
AST: 33 U/L (ref 15–41)
Albumin: 4.8 g/dL (ref 3.5–5.0)
Alkaline Phosphatase: 77 U/L (ref 38–126)
Anion gap: 12 (ref 5–15)
BUN: 24 mg/dL — ABNORMAL HIGH (ref 8–23)
CO2: 27 mmol/L (ref 22–32)
Calcium: 9.9 mg/dL (ref 8.9–10.3)
Chloride: 100 mmol/L (ref 98–111)
Creatinine, Ser: 1.33 mg/dL — ABNORMAL HIGH (ref 0.44–1.00)
GFR calc Af Amer: 50 mL/min — ABNORMAL LOW (ref >60)
GFR calc non Af Amer: 43 mL/min — ABNORMAL LOW (ref >60)
Glucose, Bld: 99 mg/dL (ref 70–99)
Potassium: 4.2 mmol/L (ref 3.5–5.1)
Sodium: 139 mmol/L (ref 135–145)
Total Bilirubin: 0.5 mg/dL (ref 0.3–1.2)
Total Protein: 8.4 g/dL — ABNORMAL HIGH (ref 6.5–8.1)

## 2019-06-13 LAB — FERRITIN: Ferritin: 18 ng/mL (ref 11–307)

## 2019-06-13 LAB — VITAMIN D 25 HYDROXY (VIT D DEFICIENCY, FRACTURES): Vit D, 25-Hydroxy: 38.26 ng/mL (ref 30–100)

## 2019-06-13 LAB — CBC WITH DIFFERENTIAL/PLATELET
Abs Immature Granulocytes: 0.02 10*3/uL (ref 0.00–0.07)
Basophils Absolute: 0 10*3/uL (ref 0.0–0.1)
Basophils Relative: 1 %
Eosinophils Absolute: 0.1 10*3/uL (ref 0.0–0.5)
Eosinophils Relative: 2 %
HCT: 42.3 % (ref 36.0–46.0)
Hemoglobin: 13.2 g/dL (ref 12.0–15.0)
Immature Granulocytes: 0 %
Lymphocytes Relative: 19 %
Lymphs Abs: 1.3 10*3/uL (ref 0.7–4.0)
MCH: 28.1 pg (ref 26.0–34.0)
MCHC: 31.2 g/dL (ref 30.0–36.0)
MCV: 90 fL (ref 80.0–100.0)
Monocytes Absolute: 0.6 10*3/uL (ref 0.1–1.0)
Monocytes Relative: 9 %
Neutro Abs: 4.7 10*3/uL (ref 1.7–7.7)
Neutrophils Relative %: 69 %
Platelets: 328 10*3/uL (ref 150–400)
RBC: 4.7 MIL/uL (ref 3.87–5.11)
RDW: 20 % — ABNORMAL HIGH (ref 11.5–15.5)
WBC: 6.7 10*3/uL (ref 4.0–10.5)
nRBC: 0 % (ref 0.0–0.2)

## 2019-06-13 LAB — LACTATE DEHYDROGENASE: LDH: 175 U/L (ref 98–192)

## 2019-06-13 LAB — IRON AND TIBC
Iron: 53 ug/dL (ref 28–170)
Saturation Ratios: 10 % — ABNORMAL LOW (ref 10.4–31.8)
TIBC: 532 ug/dL — ABNORMAL HIGH (ref 250–450)
UIBC: 479 ug/dL

## 2019-06-13 LAB — FOLATE: Folate: 10.2 ng/mL (ref >5.9)

## 2019-06-13 LAB — VITAMIN B12: Vitamin B-12: 228 pg/mL (ref 180–914)

## 2019-06-20 ENCOUNTER — Inpatient Hospital Stay (HOSPITAL_BASED_OUTPATIENT_CLINIC_OR_DEPARTMENT_OTHER): Payer: Commercial Managed Care - PPO | Admitting: Nurse Practitioner

## 2019-06-20 ENCOUNTER — Other Ambulatory Visit: Payer: Self-pay

## 2019-06-20 DIAGNOSIS — D500 Iron deficiency anemia secondary to blood loss (chronic): Secondary | ICD-10-CM

## 2019-06-20 DIAGNOSIS — D509 Iron deficiency anemia, unspecified: Secondary | ICD-10-CM | POA: Diagnosis not present

## 2019-06-20 NOTE — Assessment & Plan Note (Addendum)
1.  Iron deficiency anemia: - Due to chronic blood loss secondary to chronic GI blood loss. 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  Also showed polypoid lesions in the duodenum, single duodenum AVM. - Her colonoscopy on 01/17/2013 showed rectal varices, polyps and hemorrhoids. - She had a flex sigmoidoscopy with Dr. Oneida Alar on 01/21/2016 showing rectal bleeding due to proctitis and possibly internal hemorrhoids. -She had her gallbladder removed September 2020 due to a lodged stone and inflammation.  Per the patient she lost about a unit of blood at that time. -Patient was recently hospitalized in December 2020 for an AVM bleed where her hemoglobin dropped to 7.7. 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  Injected.  Treated with bipolar cautery.  Clips were placed. -She last received 4 doses of Venofer from 04/04/2019 through 05/05/2019. -She had Covid in between her 4 doses. -Labs done on 06/13/2019 showed hemoglobin has improved to 13.2, ferritin 18, percent saturation 10 - She will follow-up in 3 months with repeat labs.  2.  Chronic kidney disease: -Creatinine is 1.33 -We will continue to monitor.

## 2019-06-20 NOTE — Patient Instructions (Signed)
[facility] at [facility] Discharge Instructions  Follow up in 3 months with lab s   Thank you for choosing [facility] at [facility] to provide your oncology and hematology care.  To afford each patient quality time with our provider, please arrive at least 15 minutes before your scheduled appointment time.   If you have a lab appointment with the Cancer Center please come in thru the Main Entrance and check in at the main information desk.  You need to re-schedule your appointment should you arrive 10 or more minutes late.  We strive to give you quality time with our providers, and arriving late affects you and other patients whose appointments are after yours.  Also, if you no show three or more times for appointments you may be dismissed from the clinic at the providers discretion.     Again, thank you for choosing [facility].  Our hope is that these requests will decrease the amount of time that you wait before being seen by our physicians.       _____________________________________________________________  Should you have questions after your visit to [facility], please contact our office at [phone] between the hours of 8:00 a.m. and 4:30 p.m.  Voicemails left after 4:00 p.m. will not be returned until the following business day.  For prescription refill requests, have your pharmacy contact our office and allow 72 hours.    Due to Covid, you will need to wear a mask upon entering the hospital. If you do not have a mask, a mask will be given to you at the Main Entrance upon arrival. For doctor visits, patients may have 1 support person with them. For treatment visits, patients can not have anyone with them due to social distancing guidelines and our immunocompromised population.      

## 2019-06-20 NOTE — Progress Notes (Signed)
Deanna Thompson, [place] [address]   CLINIC:  Medical Oncology/Hematology  PCP:  Mikey Kirschner, [place] [address] [phone]   REASON FOR VISIT: Follow-up for iron deficiency anemia  CURRENT THERAPY: Intermittent iron infusions   INTERVAL HISTORY:  Deanna Thompson 64 y.o. female returns for routine follow-up for iron deficiency anemia.  Patient reports she has been doing well since her last iron infusions.  She reports she did have Covid in between before infusions.  She reports she still has some fatigue.  However she feels that its more from Covid than anemia.  Patient denies any bright red bleeding per rectum or melena.  She denies any easy bruising or bleeding. Denies any nausea, vomiting, or diarrhea. Denies any new pains. Had not noticed any recent bleeding such as epistaxis, hematuria or hematochezia. Denies recent chest pain on exertion, shortness of breath on minimal exertion, pre-syncopal episodes, or palpitations. Denies any numbness or tingling in hands or feet. Denies any recent fevers, infections, or recent hospitalizations. Patient reports appetite at 100% and energy level at 50%.  She is eating well maintain her weight at this time.    REVIEW OF SYSTEMS:  Review of Systems  Gastrointestinal: Positive for constipation.  All other systems reviewed and are negative.    PAST MEDICAL/SURGICAL HISTORY:  Past Medical History:  Diagnosis Date  . Abdominal hernia    repaired  . Abscess of pulp of tooth 12/12/14   left side  . Anxiety   . Arthritis    back  and down flank  right side more than left  . B12 deficiency 06/12/2015  . Brain aneurysm    METAL COIL PREVENTS MRI IMAGING and CLIP  . Cirrhosis ([place])    Presumably alcoholic cirrhosis  . Complication of anesthesia   . Depression   . ETOH abuse    Quit in 07/2011   . Fatigue    due to Sjogren's disease  . Folate deficiency 06/30/2016  . GERD  (gastroesophageal reflux disease)   . HTN (hypertension)    no longer medicated, dated on 04/25/12  . IBS (irritable bowel syndrome)   . Iron deficiency anemia due to chronic blood loss   . Myofascial pain   . Panic attack   . PONV (postoperative nausea and vomiting)   . Psoriatic arthritis ([place])   . Right inguinal hernia 08/16/2012  . Seizures ([place]) 1997   1 seizure from coil insertion but no more seizures and no meds for seizures  . Sjogren's disease ([place])   . [place] HEAD 02/03/2007   Qualifier: Diagnosis of  By: Aline Brochure MD, Dorothyann Peng    . TIA (transient ischemic attack)   . 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([place])   . Vertigo    Past Surgical History:  Procedure Laterality Date  . AGILE CAPSULE N/A 06/21/2014   Procedure: AGILE CAPSULE;  Surgeon: Danie Binder, MD;  [facility];  Service: Endoscopy;  Laterality: N/A;  0700  . BIOPSY  10/13/2011   SLF: mild gastritis/duodenal polypoid lesion   . CEREBRAL ANEURYSM REPAIR  1997  . CHOLECYSTECTOMY N/A 10/28/2018   Procedure: LAPAROSCOPIC CHOLECYSTECTOMY WITH INTRAOPERATIVE CHOLANGIOGRAM;  Surgeon: Virl Cagey, MD;  Location: [facility] ORS;  Service: General;  Laterality: N/A;  . COLONOSCOPY  10/13/2011   [id number] hemorrhoids/varices rectal/lesion in ascending colon(HYPERPLASTIC POLYP)  . COLONOSCOPY N/A 01/17/2013   SLF: 1. Internal hemorrhoids 2. Ascending colon polyps 3.  Rectal  varices.   . COLONOSCOPY N/A 08/10/2016   Dr. Oneida Alar: redundant left colon. external and internal hemorhroids. 5-10 year surveillance  . ESOPHAGOGASTRODUODENOSCOPY N/A 06/29/2014   SLF: 1. anemia due to polypoid lesions in teh antrum, and duodenal polyps 2. single duodenum AVM  . ESOPHAGOGASTRODUODENOSCOPY (EGD) WITH PROPOFOL N/A 03/07/2019   Procedure: ESOPHAGOGASTRODUODENOSCOPY (EGD) WITH PROPOFOL;  Surgeon: Danie Binder, MD;  [facility];  Service: Endoscopy;  Laterality: N/A;  1:00pm  .  ESOPHAGOGASTRODUODENOSCOPY (EGD) WITH PROPOFOL N/A 03/16/2019   Procedure: ESOPHAGOGASTRODUODENOSCOPY (EGD) WITH PROPOFOL;  Surgeon: Danie Binder, MD;  [facility];  Service: Endoscopy;  Laterality: N/A;  . FLEXIBLE SIGMOIDOSCOPY N/A 01/21/2016   Procedure: FLEXIBLE SIGMOIDOSCOPY;  Surgeon: Danie Binder, MD;  [facility];  Service: Endoscopy;  Laterality: N/A;  245  . GIVENS CAPSULE STUDY N/A 06/29/2014   Procedure: GIVENS CAPSULE STUDY;  Surgeon: Danie Binder, MD;  [facility];  Service: Endoscopy;  Laterality: N/A;  . GIVENS CAPSULE STUDY N/A 07/13/2014   Procedure: GIVENS CAPSULE STUDY;  Surgeon: Danie Binder, MD;  [facility];  Service: Endoscopy;  Laterality: N/A;  700  . HEMORRHOID BANDING N/A 01/21/2016   Procedure: HEMORRHOID BANDING;  Surgeon: Danie Binder, MD;  [facility];  Service: Endoscopy;  Laterality: N/A;  . HEMOSTASIS CLIP PLACEMENT  03/16/2019   Procedure: HEMOSTASIS CLIP PLACEMENT;  Surgeon: Danie Binder, MD;  [facility];  Service: Endoscopy;;  x 4  . HEMOSTASIS CONTROL  03/16/2019   Procedure: HEMOSTASIS CONTROL;  Surgeon: Danie Binder, MD;  [facility];  Service: Endoscopy;;  injection with epinephine 0.1mg /ml via scope and bipolar cautery with gold probe  . INGUINAL HERNIA REPAIR Right 08/28/2013   Procedure: RIGHT NGUINAL HERNIORRHAPHY WITH MESH;  Surgeon: Jamesetta So, MD;  Location: [facility];  Service: General;  Laterality: Right;  . INSERTION OF MESH N/A 06/09/2013   Procedure: INSERTION OF MESH;  Surgeon: Jamesetta So, MD;  Location: [facility];  Service: General;  Laterality: N/A;  . INSERTION OF MESH Right 08/28/2013   Procedure: INSERTION OF MESH;  Surgeon: Jamesetta So, MD;  Location: [facility];  Service: General;  Laterality: Right;  . PARACENTESIS  Feb 2014   10.7 liters  . TOOTH EXTRACTION Left 12/12/14  . UMBILICAL HERNIA REPAIR N/A 06/09/2013   Procedure: UMBILICAL  HERNIORRHAPHY WITH MESH;  Surgeon: Jamesetta So, MD;  Location: [facility];  Service: General;  Laterality: N/A;     SOCIAL HISTORY:  Social History   Socioeconomic History  . Marital status: Married    Spouse name: Kris Mouton  . Number of children: 0  . Years of education: Not on file  . Highest education level: Not on file  Occupational History    Employer: RETIRED  Tobacco Use  . Smoking status: Former Smoker    Packs/day: 2.00    Years: 12.00    Pack years: 24.00    Types: Cigarettes    Quit date: 03/31/1995    Years since quitting: 24.2  . Smokeless tobacco: Never Used  . Tobacco comment: quit in 1997  Substance and Sexual Activity  . Alcohol use: No    Comment: Bottle of wine a day for years, but quit in 07/2011  . Drug use: No  . Sexual activity: Not Currently    Birth control/protection: Post-menopausal  Other Topics Concern  . Not on file  Social History  Narrative   Lives   Caffeine use:    Quit drinking alcohol 3 YEARS ago. No drugs, no IVDU.    Social Determinants of Health   Financial Resource Strain:   . Difficulty of Paying Living Expenses:   Food Insecurity:   . Worried About Charity fundraiser in the Last Year:   . Arboriculturist in the Last Year:   Transportation Needs:   . Film/video editor (Medical):   Marland Kitchen Lack of Transportation (Non-Medical):   Physical Activity:   . Days of Exercise per Week:   . Minutes of Exercise per Session:   Stress:   . Feeling of Stress :   Social Connections:   . Frequency of Communication with Friends and Family:   . Frequency of Social Gatherings with Friends and Family:   . Attends Religious Services:   . Active Member of Clubs or Organizations:   . Attends Archivist Meetings:   Marland Kitchen Marital Status:   Intimate Partner Violence:   . Fear of Current or Ex-Partner:   . Emotionally Abused:   Marland Kitchen Physically Abused:   . Sexually Abused:     FAMILY HISTORY:  Family History  Problem Relation Age of Onset   . Cirrhosis Mother        nash  . COPD Father   . Heart disease Father   . Asthma Other   . Heart disease Other   . Colon cancer Neg Hx   . Inflammatory bowel disease Neg Hx     CURRENT MEDICATIONS:  Outpatient Encounter Medications as of 06/20/2019  Medication Sig  . aspirin EC 81 MG tablet Take 1 tablet (81 mg total) by mouth daily with breakfast.  . atorvastatin (LIPITOR) 40 MG tablet Take 1 tablet (40 mg total) by mouth daily.  . bumetanide (BUMEX) 1 MG tablet Take 1 tablet (1 mg total) by mouth 2 (two) times daily.  Marland Kitchen buPROPion (WELLBUTRIN SR) 100 MG 12 hr tablet TAKE (1) TABLET BY MOUTH TWICE DAILY. (Patient taking differently: Take 100 mg by mouth 2 (two) times daily. )  . cholecalciferol (VITAMIN D3) 25 MCG (1000 UT) tablet Take 2 tablets (2,000 Units total) by mouth daily. (Patient taking differently: Take 1,000 Units by mouth every morning. )  . DULoxetine (CYMBALTA) 30 MG capsule Take 30 mg by mouth daily.  . famotidine (PEPCID) 20 MG tablet Take 1 tablet (20 mg total) by mouth 2 (two) times daily before a meal.  . fluticasone (FLONASE) 50 MCG/ACT nasal spray Place 1 spray into both nostrils See admin instructions. Instill 1 spray into each nostril every morning, may also use later in the day as needed for sinus congestion  . Lifitegrast (XIIDRA) 5 % SOLN Place 1 drop into both eyes 2 (two) times daily.  Marland Kitchen LINZESS 145 MCG CAPS capsule TAKE (1) CAPSULE BY MOUTH EVERY DAY BEFORE BREAKFAST.  Marland Kitchen loratadine (CLARITIN) 10 MG tablet Take 10 mg by mouth daily.  Marland Kitchen losartan (COZAAR) 25 MG tablet Take 1 tablet (25 mg total) by mouth daily.  . Multiple Vitamin (MULTIVITAMIN WITH MINERALS) TABS tablet Take 1 tablet by mouth daily.  Marland Kitchen spironolactone (ALDACTONE) 100 MG tablet Take 0.5 tablets (50 mg total) by mouth 2 (two) times daily.  . Tofacitinib Citrate ER (XELJANZ XR) 11 MG TB24 Take 11 mg by mouth daily.   Marland Kitchen ALPRAZolam (XANAX) 0.5 MG tablet Take 1/2 tablet po qhs prn sleep (Patient not  taking: Reported on 06/20/2019)  . Camphor-Menthol-Methyl  Sal (SALONPAS) 3.04-04-08 % PTCH Place 1 patch onto the skin daily as needed (pain.).  Marland Kitchen escitalopram (LEXAPRO) 20 MG tablet TAKE (1) TABLET BY MOUTH AT BEDTIME. (Patient taking differently: Take 20 mg by mouth at bedtime. )  . ondansetron (ZOFRAN ODT) 4 MG disintegrating tablet Place one tablet under tongue q 8 hours prn nausea (Patient not taking: Reported on 06/20/2019)  . Polyvinyl Alcohol-Povidone (REFRESH OP) Place 1 drop into both eyes 3 (three) times daily as needed (dry eyes).  . Simethicone (GAS-X PO) Take 1 tablet by mouth 2 (two) times daily as needed (bloating/flatulence).  . sodium chloride (OCEAN) 0.65 % nasal spray Place 1 spray into the nose 2 (two) times daily as needed for congestion.    No facility-administered encounter medications on file as of 06/20/2019.    ALLERGIES:  Allergies  Allergen Reactions  . Omeprazole Shortness Of Breath  . Orencia [Abatacept] Anaphylaxis, Hives and Rash  . Other Shortness Of Breath and Rash    Red Meat Causes shortness of breath, rash and flu like symptoms.   . Phenytoin Rash  . Keflex [Cephalexin] Other (See Comments)    Severe yeast infection  . Lidocaine Viscous Hcl     NAUSEA/PROLONGED NUMBNESS  . Phenergan [Promethazine Hcl] Other (See Comments)    Not in right state of mind, HALLUCINATION     PHYSICAL EXAM:  ECOG Performance status: 1  Vitals:   06/20/19 1325  BP: 100/63  Pulse: 86  Resp: 14  Temp: (!) 96.2 F (35.7 C)  SpO2: 100%   Filed Weights   06/20/19 1325  Weight: 241 lb 11.2 oz (109.6 kg)    Physical Exam Constitutional:      Appearance: Normal appearance. She is normal weight.  Cardiovascular:     Rate and Rhythm: Normal rate and regular rhythm.     Heart sounds: Normal heart sounds.  Pulmonary:     Effort: Pulmonary effort is normal.     Breath sounds: Normal breath sounds.  Abdominal:     General: Bowel sounds are normal.  Musculoskeletal:         General: Normal range of motion.  Skin:    General: Skin is warm.  Neurological:     Mental Status: She is alert and oriented to person, place, and time. Mental status is at baseline.  Psychiatric:        Mood and Affect: Mood normal.        Behavior: Behavior normal.        Thought Content: Thought content normal.        Judgment: Judgment normal.      LABORATORY DATA:  I have reviewed the labs as listed.  CBC    Component Value Date/Time   WBC 6.7 06/13/2019 1100   RBC 4.70 06/13/2019 1100   HGB 13.2 06/13/2019 1100   HGB 9.2 (L) 03/27/2019 0854   HCT 42.3 06/13/2019 1100   HCT 28.7 (L) 03/27/2019 0854   HCT 41 07/30/2011 0743   PLT 328 06/13/2019 1100   PLT 376 03/27/2019 0854   MCV 90.0 06/13/2019 1100   MCV 89 03/27/2019 0854   MCV 104.3 07/30/2011 0743   MCH 28.1 06/13/2019 1100   MCHC 31.2 06/13/2019 1100   RDW 20.0 (H) 06/13/2019 1100   RDW 15.8 (H) 03/27/2019 0854   LYMPHSABS 1.3 06/13/2019 1100   LYMPHSABS 1.3 03/27/2019 0854   MONOABS 0.6 06/13/2019 1100   EOSABS 0.1 06/13/2019 1100   EOSABS 0.2 03/27/2019 0854  BASOSABS 0.0 06/13/2019 1100   BASOSABS 0.1 03/27/2019 0854   CMP Latest Ref Rng & Units 06/13/2019 04/03/2019 03/27/2019  Glucose 70 - 99 mg/dL 99 112(H) 110(H)  BUN 8 - 23 mg/dL 24(H) 18 12  Creatinine 0.44 - 1.00 mg/dL 1.33(H) 1.25(H) 1.37(H)  Sodium 135 - 145 mmol/L 139 136 144  Potassium 3.5 - 5.1 mmol/L 4.2 3.9 4.4  Chloride 98 - 111 mmol/L 100 101 102  CO2 22 - 32 mmol/L 27 23 24   Calcium 8.9 - 10.3 mg/dL 9.9 9.2 9.8  Total Protein 6.5 - 8.1 g/dL 8.4(H) 7.8 6.8  Total Bilirubin 0.3 - 1.2 mg/dL 0.5 0.7 0.3  Alkaline Phos 38 - 126 U/L 77 66 76  AST 15 - 41 U/L 33 29 18  ALT 0 - 44 U/L 32 31 19    I personally performed a face-to-face visit,   All questions were answered to patient's stated satisfaction. Encouraged patient to call with any new concerns or questions before his next visit to the cancer center and we can certain see  him sooner, if needed.    ASSESSMENT & PLAN:   Iron deficiency anemia due to chronic blood loss 1.  Iron deficiency anemia: - Due to chronic blood loss secondary to chronic GI blood loss. 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  Also showed polypoid lesions in the duodenum, single duodenum AVM. - Her colonoscopy on 01/17/2013 showed rectal varices, polyps and hemorrhoids. - She had a flex sigmoidoscopy with Dr. Oneida Alar on 01/21/2016 showing rectal bleeding due to proctitis and possibly internal hemorrhoids. -She had her gallbladder removed September 2020 due to a lodged stone and inflammation.  Per the patient she lost about a unit of blood at that time. -Patient was recently hospitalized in December 2020 for an AVM bleed where her hemoglobin dropped to 7.7. 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  Injected.  Treated with bipolar cautery.  Clips were placed. -She last received 4 doses of Venofer from 04/04/2019 through 05/05/2019. -She had Covid in between her 4 doses. -Labs done on 06/13/2019 showed hemoglobin has improved to 13.2, ferritin 18, percent saturation 10 - She will follow-up in 3 months with repeat labs.  2.  Chronic kidney disease: -Creatinine is 1.33 -We will continue to monitor.      Orders placed this encounter:  Orders Placed This Encounter  Procedures  . Lactate dehydrogenase  . CBC with Differential/Platelet  . Comprehensive metabolic panel  . Ferritin  . Iron and TIBC  . Vitamin B12  . VITAMIN D 25 Hydroxy (Vit-D Deficiency, Fractures)  . Folate      Deanna Finders, FNP-C [place] [phone]

## 2019-06-21 ENCOUNTER — Encounter: Payer: Self-pay | Admitting: Gastroenterology

## 2019-06-21 ENCOUNTER — Ambulatory Visit (INDEPENDENT_AMBULATORY_CARE_PROVIDER_SITE_OTHER): Payer: Commercial Managed Care - PPO | Admitting: Gastroenterology

## 2019-06-21 DIAGNOSIS — K581 Irritable bowel syndrome with constipation: Secondary | ICD-10-CM

## 2019-06-21 DIAGNOSIS — K7030 Alcoholic cirrhosis of liver without ascites: Secondary | ICD-10-CM

## 2019-06-21 DIAGNOSIS — K589 Irritable bowel syndrome without diarrhea: Secondary | ICD-10-CM | POA: Insufficient documentation

## 2019-06-21 NOTE — Assessment & Plan Note (Signed)
SYMPTOMS NOT IDEALLY CONTROLLED ON LINZESS AND STOOL SOFTENERS.   USE MAGNESIUM CITRATE TABLETS 1 ON DAY 1 THEN IF NO BM USE 2 ON DAY 2 AND HOLD DAY 3. REPEAT TO HAVE A SATISFACTORY BOWEL MOVEMENT. SEE ERIC GILL IN 6 MOS.

## 2019-06-21 NOTE — Progress Notes (Signed)
Subjective:    Patient ID: Deanna Thompson, female    DOB: 05-11-1955, 64 y.o.   MRN: [id number] Mikey Kirschner, MD  HPI BEEN CONSTIPATED SINCE HAD COVID MID-January. TAKING LINZESS AND STOOL SOFTENER. WAS CONSTIPATED AND LARGE TURD BUT NOT HAPPENING.   HASN'T TRIED MAGNESIUM CITRATE PILLS. INTENTIONAL WEIGHT LOSS. ULCER ON INSIDE OF GUM. HAD THIS ONE 13 DAYS AND NOT GETTING SMALLER. THINKS ITS RELATED TO SALIVA. ON STEROID AND TOPICAL MEDS. DIAGNOSED AS SOFT BIPOLAR AND NOW DULOXETINE AND NOW FEELS BETTER.   PT DENIES FEVER, CHILLS, HEMATOCHEZIA, HEMATEMESIS, nausea, vomiting, melena, diarrhea, CHEST PAIN, SHORTNESS OF BREATH,  CHANGE IN BOWEL IN HABITS, abdominal pain, problems swallowing, OR heartburn or indigestion.  Past Medical History:  Diagnosis Date  . Abdominal hernia    repaired  . Abscess of pulp of tooth 12/12/14   left side  . Anxiety   . Arthritis    back  and down flank  right side more than left  . B12 deficiency 06/12/2015  . Brain aneurysm    METAL COIL PREVENTS MRI IMAGING and CLIP  . Cirrhosis ([place])    Presumably alcoholic cirrhosis  . Complication of anesthesia   . Depression   . ETOH abuse    Quit in 07/2011   . Fatigue    due to Sjogren's disease  . Folate deficiency 06/30/2016  . GERD (gastroesophageal reflux disease)   . HTN (hypertension)    no longer medicated, dated on 04/25/12  . IBS (irritable bowel syndrome)   . Iron deficiency anemia due to chronic blood loss   . Myofascial pain   . Panic attack   . PONV (postoperative nausea and vomiting)   . Psoriatic arthritis ([place])   . Right inguinal hernia 08/16/2012  . Seizures ([place]) 1997   1 seizure from coil insertion but no more seizures and no meds for seizures  . Sjogren's disease ([place])   . [place] HEAD 02/03/2007   Qualifier: Diagnosis of  By: Aline Brochure MD, Dorothyann Peng    . TIA (transient ischemic attack)   . 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([place])   . Vertigo     Past  Surgical History:  Procedure Laterality Date  . AGILE CAPSULE N/A 06/21/2014   Procedure: AGILE CAPSULE;  Surgeon: Danie Binder, MD;  [facility];  Service: Endoscopy;  Laterality: N/A;  0700  . BIOPSY  10/13/2011   SLF: mild gastritis/duodenal polypoid lesion   . CEREBRAL ANEURYSM REPAIR  1997  . CHOLECYSTECTOMY N/A 10/28/2018   Procedure: LAPAROSCOPIC CHOLECYSTECTOMY WITH INTRAOPERATIVE CHOLANGIOGRAM;  Surgeon: Virl Cagey, MD;  Location: [facility];  Service: General;  Laterality: N/A;  . COLONOSCOPY  10/13/2011   [id number] hemorrhoids/varices rectal/lesion in ascending colon(HYPERPLASTIC POLYP)  . COLONOSCOPY N/A 01/17/2013   SLF: 1. Internal hemorrhoids 2. Ascending colon polyps 3.  Rectal varices.   . COLONOSCOPY N/A 08/10/2016   Dr. Oneida Alar: redundant left colon. external and internal hemorhroids. 5-10 year surveillance  . ESOPHAGOGASTRODUODENOSCOPY N/A 06/29/2014   SLF: 1. anemia due to polypoid lesions in teh antrum, and duodenal polyps 2. single duodenum AVM  . ESOPHAGOGASTRODUODENOSCOPY (EGD) WITH PROPOFOL N/A 03/07/2019   Procedure: ESOPHAGOGASTRODUODENOSCOPY (EGD) WITH PROPOFOL;  Surgeon: Danie Binder, MD;  [facility];  Service: Endoscopy;  Laterality: N/A;  1:00pm  . ESOPHAGOGASTRODUODENOSCOPY (EGD) WITH PROPOFOL N/A 03/16/2019   Procedure: ESOPHAGOGASTRODUODENOSCOPY (EGD) WITH PROPOFOL;  Surgeon: Danie Binder, MD;  [facility];  Service: Endoscopy;  Laterality: N/A;  .  FLEXIBLE SIGMOIDOSCOPY N/A 01/21/2016   Procedure: FLEXIBLE SIGMOIDOSCOPY;  Surgeon: Danie Binder, MD;  [facility];  Service: Endoscopy;  Laterality: N/A;  245  . GIVENS CAPSULE STUDY N/A 06/29/2014   Procedure: GIVENS CAPSULE STUDY;  Surgeon: Danie Binder, MD;  [facility];  Service: Endoscopy;  Laterality: N/A;  . GIVENS CAPSULE STUDY N/A 07/13/2014   Procedure: GIVENS CAPSULE STUDY;  Surgeon: Danie Binder, MD;  [facility];   Service: Endoscopy;  Laterality: N/A;  700  . HEMORRHOID BANDING N/A 01/21/2016   Procedure: HEMORRHOID BANDING;  Surgeon: Danie Binder, MD;  [facility];  Service: Endoscopy;  Laterality: N/A;  . HEMOSTASIS CLIP PLACEMENT  03/16/2019   Procedure: HEMOSTASIS CLIP PLACEMENT;  Surgeon: Danie Binder, MD;  [facility];  Service: Endoscopy;;  x 4  . HEMOSTASIS CONTROL  03/16/2019   Procedure: HEMOSTASIS CONTROL;  Surgeon: Danie Binder, MD;  [facility];  Service: Endoscopy;;  injection with epinephine 0.1mg /ml via scope and bipolar cautery with gold probe  . INGUINAL HERNIA REPAIR Right 08/28/2013   Procedure: RIGHT NGUINAL HERNIORRHAPHY WITH MESH;  Surgeon: Jamesetta So, MD;  Location: [facility];  Service: General;  Laterality: Right;  . INSERTION OF MESH N/A 06/09/2013   Procedure: INSERTION OF MESH;  Surgeon: Jamesetta So, MD;  [facility]: [facility];  Service: General;  Laterality: N/A;  . INSERTION OF MESH Right 08/28/2013   Procedure: INSERTION OF MESH;  Surgeon: Jamesetta So, MD;  [facility]: [facility];  Service: General;  Laterality: Right;  . PARACENTESIS  Feb 2014   10.7 liters  . TOOTH EXTRACTION Left 12/12/14  . UMBILICAL HERNIA REPAIR N/A 06/09/2013   Procedure: UMBILICAL HERNIORRHAPHY WITH MESH;  Surgeon: Jamesetta So, MD;  Location: [facility];  Service: General;  Laterality: N/A;   Allergies  Allergen Reactions  . Omeprazole Shortness Of Breath  . Orencia [Abatacept] Anaphylaxis, Hives and Rash  . Other Shortness Of Breath and Rash    Red Meat Causes shortness of breath, rash and flu like symptoms.   . Phenytoin Rash  . Keflex [Cephalexin] Other (See Comments)    Severe yeast infection  . Lidocaine Viscous Hcl     NAUSEA/PROLONGED NUMBNESS  . Phenergan [Promethazine Hcl] Other (See Comments)    Not in right state of mind, HALLUCINATION    Current Outpatient Medications  Medication Sig    . ALPRAZolam (XANAX) 0.5 MG tablet Take 1/2 tablet po  qhs prn sleep    . aspirin EC 81 MG tablet Take 1 tablet (81 mg total) by mouth daily with breakfast.    . atorvastatin (LIPITOR) 40 MG tablet Take 1 tablet (40 mg total) by mouth daily.    . bumetanide (BUMEX) 1 MG tablet Take 1 tablet (1 mg total) by mouth 2 (two) times daily.    Marland Kitchen buPROPion (WELLBUTRIN SR) 100 MG 12 hr tablet TAKE (1) TABLET BY MOUTH TWICE DAILY. (Patient taking differently: Take 100 mg by mouth 2 (two) times daily. )    . Camphor-Menthol-Methyl Sal (SALONPAS) 3.04-04-08 % PTCH Place 1 patch onto the skin daily as needed (pain.).    Marland Kitchen cholecalciferol (VITAMIN D3) 25 MCG (1000 UT) tablet Take 2 tablets (2,000 Units total) by mouth daily. (Patient taking differently: Take 1,000 Units by mouth every morning. )    . DULoxetine (CYMBALTA) 30 MG capsule Take 30 mg by mouth daily.    . famotidine (  PEPCID) 20 MG tablet Take 1 tablet (20 mg total) by mouth 2 (two) times daily before a meal.    . fluticasone (FLONASE) 50 MCG/ACT nasal spray Place 1 spray into both nostrils See admin instructions. Instill 1 spray into each nostril every morning, may also use later in the day as needed for sinus congestion    . Lifitegrast (XIIDRA) 5 % SOLN Place 1 drop into both eyes 2 (two) times daily.    Marland Kitchen LINZESS 145 MCG CAPS capsule TAKE (1) CAPSULE BY MOUTH EVERY DAY BEFORE BREAKFAST.    Marland Kitchen loratadine (CLARITIN) 10 MG tablet Take 10 mg by mouth daily.    Marland Kitchen losartan (COZAAR) 25 MG tablet Take 1 tablet (25 mg total) by mouth daily.    . Multiple Vitamin (MULTIVITAMIN WITH MINERALS) TABS tablet Take 1 tablet by mouth daily.    . Polyvinyl Alcohol-Povidone (REFRESH OP) Place 1 drop into both eyes 3 (three) times daily as needed (dry eyes).    . Simethicone (GAS-X PO) Take 1 tablet by mouth 2 (two) times daily as needed (bloating/flatulence).    . sodium chloride (OCEAN) 0.65 % nasal spray Place 1 spray into the nose 2 (two) times daily as needed for congestion.     Marland Kitchen spironolactone (ALDACTONE) 100 MG tablet  Take 0.5 tablets (50 mg total) by mouth 2 (two) times daily.    . Tofacitinib Citrate ER (XELJANZ XR) 11 MG TB24 Take 11 mg by mouth daily.     Marland Kitchen escitalopram (LEXAPRO) 20 MG tablet Take 20 mg by mouth at bedtime.)    .       Review of Systems PER HPI OTHERWISE ALL SYSTEMS ARE NEGATIVE.    Objective:   Physical Exam Constitutional:      General: She is not in acute distress.    Appearance: Normal appearance.  HENT:     Mouth/Throat:     Comments: MASK IN PLACE Eyes:     General: No scleral icterus.    Pupils: Pupils are equal, round, and reactive to light.  Cardiovascular:     Rate and Rhythm: Normal rate and regular rhythm.     Pulses: Normal pulses.     Heart sounds: Normal heart sounds.  Pulmonary:     Effort: Pulmonary effort is normal.     Breath sounds: Normal breath sounds.  Abdominal:     General: Bowel sounds are normal.     Palpations: Abdomen is soft.     Tenderness: There is no abdominal tenderness.  Musculoskeletal:     Cervical back: Normal range of motion.     Right lower leg: No edema.     Left lower leg: No edema.  Lymphadenopathy:     Cervical: No cervical adenopathy.  Skin:    General: Skin is warm and dry.  Neurological:     Mental Status: She is alert and oriented to person, place, and time.     Comments: NO  NEW FOCAL DEFICITS  Psychiatric:        Mood and Affect: Mood normal.     Comments: NORMAL AFFECT       Assessment & Plan:

## 2019-06-21 NOTE — Assessment & Plan Note (Signed)
WELL COMPENSATED DISEASE.  Complete ULTRASOUND IN JUN 2021. LOVE YOU KID! SEE ERIC GILL IN 6 MOS.

## 2019-06-21 NOTE — Patient Instructions (Signed)
Complete ULTRASOUND IN JUN 2021.  USE MAGNESIUM CITRATE TABLETS 1 ON DAY 1 THEN IF NO BM USE 2 ON DAY 2 AND HOLD DAY 3. REPEAT TO HAVE A SATISFACTORY BOWEL MOVEMENT.  LOVE YOU KID! SEE ERIC GILL IN 6 MOS.

## 2019-06-22 NOTE — Progress Notes (Signed)
CC'ED TO PCP 

## 2019-06-30 ENCOUNTER — Other Ambulatory Visit: Payer: Self-pay | Admitting: Nurse Practitioner

## 2019-07-04 ENCOUNTER — Other Ambulatory Visit (HOSPITAL_COMMUNITY): Payer: Commercial Managed Care - PPO

## 2019-07-11 ENCOUNTER — Ambulatory Visit (HOSPITAL_COMMUNITY): Payer: Commercial Managed Care - PPO | Admitting: Nurse Practitioner

## 2019-08-15 ENCOUNTER — Telehealth: Payer: Self-pay | Admitting: General Practice

## 2019-08-15 ENCOUNTER — Encounter: Payer: Self-pay | Admitting: *Deleted

## 2019-08-15 NOTE — Telephone Encounter (Signed)
Recall sent 

## 2019-08-15 NOTE — Telephone Encounter (Signed)
Recall for ultrasound 

## 2019-08-22 ENCOUNTER — Telehealth: Payer: Self-pay | Admitting: Adult Health

## 2019-08-22 DIAGNOSIS — R42 Dizziness and giddiness: Secondary | ICD-10-CM

## 2019-08-22 NOTE — Telephone Encounter (Signed)
Referral sent if patient needs telephone number [phone] . Neuro Rehab / Thanks Hinton Dyer

## 2019-08-22 NOTE — Telephone Encounter (Signed)
Pt is asking for a continuation of her PT for vertigo, please call pt to discuss.

## 2019-08-22 NOTE — Addendum Note (Signed)
Addended by: Lester [place] A on: 08/22/2019 04:45 PM   Modules accepted: Orders

## 2019-08-22 NOTE — Telephone Encounter (Signed)
I spoke with Janett Billow, NP. She gave VO for vesibular rehab.  I called pt to discuss. No answer, left a message asking her to call me back. If pt calls back please advise her that the referral to neuro rehab for vertigo has been placed and they will call her to schedule.

## 2019-08-24 ENCOUNTER — Ambulatory Visit: Payer: Commercial Managed Care - PPO | Admitting: Physical Therapy

## 2019-08-24 ENCOUNTER — Ambulatory Visit: Payer: Commercial Managed Care - PPO | Attending: Adult Health | Admitting: Physical Therapy

## 2019-08-24 ENCOUNTER — Other Ambulatory Visit: Payer: Self-pay

## 2019-08-24 VITALS — BP 135/88 | HR 79

## 2019-08-24 DIAGNOSIS — H8112 Benign paroxysmal vertigo, left ear: Secondary | ICD-10-CM | POA: Insufficient documentation

## 2019-08-24 DIAGNOSIS — R42 Dizziness and giddiness: Secondary | ICD-10-CM

## 2019-08-24 NOTE — Therapy (Signed)
[facility] [address] [place], [place], [address] Phone: [phone]   Fax:  [phone]  Physical Therapy Evaluation  Patient Details  Name: Deanna Thompson MRN: [id number] Date of Birth: 1955/09/16 Referring Provider (PT): Frann Rider, NP   Encounter Date: 08/24/2019  PT End of Session - 08/24/19 1700    Visit Number  1    Number of Visits  9    Date for PT Re-Evaluation  10/23/19    Authorization Type  [employer] UMR, VL is 47 all disciplines combined    Authorization - Visit Number  1    Authorization - Number of Visits  30    PT Start Time  0850    PT Stop Time  0939    PT Time Calculation (min)  49 min    Activity Tolerance  Patient tolerated treatment well    Behavior During Therapy  [facility] for tasks assessed/performed       Past Medical History:  Diagnosis Date  . Abdominal hernia    repaired  . Abscess of pulp of tooth 12/12/14   left side  . Anxiety   . Arthritis    back  and down flank  right side more than left  . B12 deficiency 06/12/2015  . Brain aneurysm    METAL COIL PREVENTS MRI IMAGING and CLIP  . Cirrhosis ([place])    Presumably alcoholic cirrhosis  . Complication of anesthesia   . Depression   . ETOH abuse    Quit in 07/2011   . Fatigue    due to Sjogren's disease  . Folate deficiency 06/30/2016  . GERD (gastroesophageal reflux disease)   . HTN (hypertension)    no longer medicated, dated on 04/25/12  . IBS (irritable bowel syndrome)   . Iron deficiency anemia due to chronic blood loss   . Myofascial pain   . Panic attack   . PONV (postoperative nausea and vomiting)   . Psoriatic arthritis ([place])   . Right inguinal hernia 08/16/2012  . Seizures ([place]) 1997   1 seizure from coil insertion but no more seizures and no meds for seizures  . Sjogren's disease ([place])   . [place] HEAD 02/03/2007   Qualifier: Diagnosis of  By: Aline Brochure MD, Dorothyann Peng    . TIA (transient ischemic attack)    . 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([place])   . Vertigo     Past Surgical History:  Procedure Laterality Date  . AGILE CAPSULE N/A 06/21/2014   Procedure: AGILE CAPSULE;  Surgeon: Danie Binder, MD;  [facility];  Service: Endoscopy;  Laterality: N/A;  0700  . BIOPSY  10/13/2011   SLF: mild gastritis/duodenal polypoid lesion   . CEREBRAL ANEURYSM REPAIR  1997  . CHOLECYSTECTOMY N/A 10/28/2018   Procedure: LAPAROSCOPIC CHOLECYSTECTOMY WITH INTRAOPERATIVE CHOLANGIOGRAM;  Surgeon: Virl Cagey, MD;  Location: [facility];  Service: General;  Laterality: N/A;  . COLONOSCOPY  10/13/2011   [id number] hemorrhoids/varices rectal/lesion in ascending colon(HYPERPLASTIC POLYP)  . COLONOSCOPY N/A 01/17/2013   SLF: 1. Internal hemorrhoids 2. Ascending colon polyps 3.  Rectal varices.   . COLONOSCOPY N/A 08/10/2016   Dr. Oneida Alar: redundant left colon. external and internal hemorhroids. 5-10 year surveillance  . ESOPHAGOGASTRODUODENOSCOPY N/A 06/29/2014   SLF: 1. anemia due to polypoid lesions in teh antrum, and duodenal polyps 2. single duodenum AVM  . ESOPHAGOGASTRODUODENOSCOPY (EGD) WITH PROPOFOL N/A 03/07/2019   Procedure: ESOPHAGOGASTRODUODENOSCOPY (EGD) WITH PROPOFOL;  Surgeon: Danie Binder, MD;  [facility]  [facility];  Service: Endoscopy;  Laterality: N/A;  1:00pm  . ESOPHAGOGASTRODUODENOSCOPY (EGD) WITH PROPOFOL N/A 03/16/2019   Procedure: ESOPHAGOGASTRODUODENOSCOPY (EGD) WITH PROPOFOL;  Surgeon: Danie Binder, MD;  [facility];  Service: Endoscopy;  Laterality: N/A;  . FLEXIBLE SIGMOIDOSCOPY N/A 01/21/2016   Procedure: FLEXIBLE SIGMOIDOSCOPY;  Surgeon: Danie Binder, MD;  [facility];  Service: Endoscopy;  Laterality: N/A;  245  . GIVENS CAPSULE STUDY N/A 06/29/2014   Procedure: GIVENS CAPSULE STUDY;  Surgeon: Danie Binder, MD;  [facility];  Service: Endoscopy;  Laterality: N/A;  . GIVENS CAPSULE STUDY N/A 07/13/2014   Procedure:  GIVENS CAPSULE STUDY;  Surgeon: Danie Binder, MD;  [facility];  Service: Endoscopy;  Laterality: N/A;  700  . HEMORRHOID BANDING N/A 01/21/2016   Procedure: HEMORRHOID BANDING;  Surgeon: Danie Binder, MD;  [facility];  Service: Endoscopy;  Laterality: N/A;  . HEMOSTASIS CLIP PLACEMENT  03/16/2019   Procedure: HEMOSTASIS CLIP PLACEMENT;  Surgeon: Danie Binder, MD;  [facility];  Service: Endoscopy;;  x 4  . HEMOSTASIS CONTROL  03/16/2019   Procedure: HEMOSTASIS CONTROL;  Surgeon: Danie Binder, MD;  [facility];  Service: Endoscopy;;  injection with epinephine 0.1mg /ml via scope and bipolar cautery with gold probe  . INGUINAL HERNIA REPAIR Right 08/28/2013   Procedure: RIGHT NGUINAL HERNIORRHAPHY WITH MESH;  Surgeon: Jamesetta So, MD;  Location: [facility];  Service: General;  Laterality: Right;  . INSERTION OF MESH N/A 06/09/2013   Procedure: INSERTION OF MESH;  Surgeon: Jamesetta So, MD;  Location: [facility];  Service: General;  Laterality: N/A;  . INSERTION OF MESH Right 08/28/2013   Procedure: INSERTION OF MESH;  Surgeon: Jamesetta So, MD;  Location: [facility];  Service: General;  Laterality: Right;  . PARACENTESIS  Feb 2014   10.7 liters  . TOOTH EXTRACTION Left 12/12/14  . UMBILICAL HERNIA REPAIR N/A 06/09/2013   Procedure: UMBILICAL HERNIORRHAPHY WITH MESH;  Surgeon: Jamesetta So, MD;  Location: [facility];  Service: General;  Laterality: N/A;    Vitals:   08/24/19 0901  BP: 135/88  Pulse: 79     Subjective Assessment - 08/24/19 0855    Subjective  Pt was here in 2019 for treatment of BPPV; has been able to keep it at [place].  When she would feel symptoms she would the [facility] with good results.  4 days ago it came back and was much stronger - tried the maneuvers but it hasn't resolved.    Pertinent History  abdominal hernia, anxiety, OA, B12 deficiency, brain aneurysm with metal coiling 1997, cirrhosis, depression, ETOH abuse,  fatigue due to Sjogren's disease, HTN, IBS, anemia, panic attacks, myofascial pain, psoriatic arthritis, subluxation of radial head, TIA    Diagnostic tests  can't have MRI due to coiling    Patient Stated Goals  To get rid of the dizziness again    Currently in Pain?  No/denies         [facility] Assessment - 08/24/19 0902      Assessment   Medical Diagnosis  Vertigo    Referring Provider (PT)  Frann Rider, NP    Onset Date/Surgical Date  08/22/19    Prior Therapy  yes for BPPV in 2019      Precautions   Precautions  Other (comment)    Precaution Comments  abdominal hernia, anxiety, OA, B12 deficiency,  brain aneurysm with metal coiling 1997, cirrhosis, depression, ETOH abuse, fatigue due to Sjogren's disease, HTN, IBS, anemia, panic attacks, myofascial pain, psoriatic arthritis, subluxation of radial head, TIA      Balance Screen   Has the patient fallen in the past 6 months  No   significant LOB with dizziness     Prior Function   Level of Independence  Independent      Observation/Other Assessments   Focus on Therapeutic Outcomes (FOTO)   70% function    Other Surveys   Dizziness Handicap Inventory (DHI)    Dizziness Handicap Inventory (DHI)   30      Sensation   Light Touch  Appears Intact      ROM / Strength   AROM / PROM / Strength  Strength      Strength   Overall Strength  Within functional limits for tasks performed             Vestibular Assessment - 08/24/19 0904      Symptom Behavior   Subjective history of current problem  Denies changes in vision or hearing.  Denies headache or tinnitus.  Did vomit with vertigo episode.    Type of Dizziness   Vertigo    Frequency of Dizziness  daily    Duration of Dizziness  >minutes    Symptom Nature  Motion provoked;Positional    Aggravating Factors  Rolling to right;Rolling to left;Lying supine;Supine to sit    Relieving Factors  Head stationary    Progression of Symptoms  Better    History of similar  episodes  in 2019      Oculomotor Exam   Oculomotor Alignment  Abnormal    Ocular ROM  WFL    Spontaneous  Absent    Gaze-induced   Absent    Smooth Pursuits  Intact    Saccades  Poor trajectory;Hypermetric;Undershoots   with vertical saccades   Comment  Exophoria with cover-cross cover test      Oculomotor Exam-Fixation Suppressed    Left Head Impulse  negative    Right Head Impulse  negative      Vestibulo-Ocular Reflex   VOR to Slow Head Movement  Normal    VOR Cancellation  Corrective saccades      Positional Testing   Dix-Hallpike  Dix-Hallpike Right;Dix-Hallpike Left    Horizontal Canal Testing  Horizontal Canal Right;Horizontal Canal Left      Dix-Hallpike Right   Dix-Hallpike Right Duration  0    Dix-Hallpike Right Symptoms  No nystagmus      Dix-Hallpike Left   Dix-Hallpike Left Duration  5 seconds    Dix-Hallpike Left Symptoms  Upbeat, left rotatory nystagmus      Horizontal Canal Right   Horizontal Canal Right Duration  0    Horizontal Canal Right Symptoms  Normal      Horizontal Canal Left   Horizontal Canal Left Duration  0    Horizontal Canal Left Symptoms  Normal          Objective measurements completed on examination: See above findings.       Vestibular Treatment/Exercise - 08/24/19 0925      Vestibular Treatment/Exercise   Vestibular Treatment Provided  Canalith Repositioning    Canalith Repositioning  Epley Manuever Left;Semont Procedure Left Posterior       EPLEY MANUEVER LEFT   Number of Reps   1    Overall Response   Symptoms Worsened      Semont Procedure Left  Posterior   Number of Reps   1    Overall Response  Improved Symptoms            PT Education - 08/24/19 1700    Education Details  clinical findings, PT POC and goals    Person(s) Educated  Patient    Methods  Explanation    Comprehension  Verbalized understanding          PT Long Term Goals - 08/24/19 1709      PT LONG TERM GOAL #1   Title  Patient  will be independent with home CRM and habituation HEP    Time  8    Period  Weeks    Status  New    Target Date  10/23/19      PT LONG TERM GOAL #2   Title  Patient will have negative positional testing for BPPV to reduce dizziness during ADLs.    Baseline  Positive for L posterior canal BPPV    Time  8    Period  Weeks    Status  New    Target Date  10/23/19      PT LONG TERM GOAL #3   Title  Pt will report no dizziness with rolling or supine <> sit    Time  8    Period  Weeks    Status  New    Target Date  10/23/19      PT LONG TERM GOAL #4   Title  Pt will report overall function on FOTO to >/= 84% and decrease in [place] to </= 12    Baseline  70%: 30    Time  8    Period  Weeks    Status  New    Target Date  10/23/19             Plan - 08/24/19 1701    Clinical Impression Statement  Pt is a 64 y.o. female referred to outpatient neurorehabilitation for assessment of vertigo.  Pt was evaluated and treated for BPPV in 2019.  Pt's PMH is significant for the following: abdominal hernia, anxiety, OA, B12 deficiency, brain aneurysm with metal coiling 1997, cirrhosis, depression, ETOH abuse, fatigue due to Sjogren's disease, HTN, IBS, anemia, panic attacks, myofascial pain, psoriatic arthritis, subluxation of radial head, TIA.  Pt's evaluation revealed upbeating, L rotary nystagmus of short duration during L hallpike-dix maneuver indicating L posterior canal canalithiasis, treated with Epley and then Semont Maneuver with improvement in symptoms but did not resolve.  Pt required multiple sessions and maneuvers for complete resolution in 2019; pt would benefit from skilled PT services to address positional vertigo to maximize functional mobility independence and decrease falls risk.    Personal Factors and Comorbidities  Comorbidity 3+;Past/Current Experience    Comorbidities  abdominal hernia, anxiety, OA, B12 deficiency, brain aneurysm with metal coiling 1997, cirrhosis, depression,  ETOH abuse, fatigue due to Sjogren's disease, HTN, IBS, anemia, panic attacks, myofascial pain, psoriatic arthritis, subluxation of radial head, TIA    Examination-Activity Limitations  Bed Mobility    Stability/Clinical Decision Making  Stable/Uncomplicated    Clinical Decision Making  Low    Rehab Potential  Excellent    PT Frequency  1x / week    PT Duration  8 weeks    PT Treatment/Interventions  ADLs/Self Care Home Management;Canalith Repostioning;Therapeutic activities;Neuromuscular re-education;Patient/family education;Vestibular    PT Next Visit Plan  recheck L BPPV and treat with epley or semont.  Have pt start back  on brandt daroff?    Consulted and Agree with Plan of Care  Patient       Patient will benefit from skilled therapeutic intervention in order to improve the following deficits and impairments:  Dizziness  Visit Diagnosis: Dizziness and giddiness  BPPV (benign paroxysmal positional vertigo), left     Problem List Patient Active Problem List   Diagnosis Date Noted  . IBS (irritable bowel syndrome)   . Symptomatic anemia 03/15/2019  . GI bleeding 03/14/2019  . Gastric AVM   . Postoperative anemia due to acute blood loss 10/29/2018  . Gallstone pancreatitis   . Elevated liver enzymes   . Non-intractable vomiting   . Abstinence from alcohol   . Cholecystitis, acute 10/20/2018  . Benign paroxysmal positional vertigo   . 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 Essential hypertension 07/31/2017  . GERD (gastroesophageal reflux disease) 07/31/2017  . Alcohol abuse 07/31/2017  . Brain aneurysm 07/31/2017  . CKD (chronic kidney disease), stage III 07/31/2017  . LBBB (left bundle branch block) 07/31/2017  . Dizziness 07/31/2017  . Folate deficiency 06/30/2016  . Constipation 04/01/2016  . 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 Hemorrhoids, internal, with bleeding 01/08/2016  . B12 deficiency 06/12/2015  . Psoriatic arthritis ([place]) 04/15/2015  . Nutritional anemia  09/19/2014  . Cirrhosis of liver ([place]) 09/19/2014  . Iron deficiency anemia due to chronic blood loss   . Insomnia 03/17/2014  . Depression 02/18/2014  . Labial swelling 08/23/2012  . Rash and nonspecific skin eruption 06/30/2012  . Colon polyps 11/18/2011  . Cirrhosis ([place]) 08/19/2011    Rico Junker, PT, DPT 08/24/19    5:13 PM    [facility] [address] [place], [place], [address] Phone: [phone]   Fax:  [phone]  Name: Jameria Jou MRN: [id number] Date of Birth: [birth year]

## 2019-08-29 ENCOUNTER — Other Ambulatory Visit: Payer: Self-pay | Admitting: Family Medicine

## 2019-09-04 ENCOUNTER — Telehealth: Payer: Self-pay | Admitting: Gastroenterology

## 2019-09-04 DIAGNOSIS — K7030 Alcoholic cirrhosis of liver without ascites: Secondary | ICD-10-CM

## 2019-09-04 NOTE — Telephone Encounter (Signed)
[place] scheduled for 09/08/19. Pt wanted [place] next week. [place] abd re-scheduled for 09/12/19 at 10:30am, arrive at 10:15am. NPO after midnight before test.  Called and informed pt of [place] appt. Letter mailed.

## 2019-09-04 NOTE — Addendum Note (Signed)
Addended by: Hassan Rowan on: 09/04/2019 10:53 AM   Modules accepted: Orders

## 2019-09-04 NOTE — Telephone Encounter (Signed)
PATIENT RECEIVED LETTER TO SCHEDULE ULTRASOUND  CALL [phone] OR PATIENT CELL

## 2019-09-06 ENCOUNTER — Other Ambulatory Visit (HOSPITAL_COMMUNITY): Payer: Self-pay | Admitting: Family Medicine

## 2019-09-06 DIAGNOSIS — Z1231 Encounter for screening mammogram for malignant neoplasm of breast: Secondary | ICD-10-CM

## 2019-09-08 ENCOUNTER — Ambulatory Visit (HOSPITAL_COMMUNITY): Payer: Commercial Managed Care - PPO

## 2019-09-12 ENCOUNTER — Inpatient Hospital Stay (HOSPITAL_COMMUNITY): Payer: Commercial Managed Care - PPO | Attending: Hematology

## 2019-09-12 ENCOUNTER — Ambulatory Visit (HOSPITAL_COMMUNITY): Payer: Commercial Managed Care - PPO

## 2019-09-12 ENCOUNTER — Other Ambulatory Visit: Payer: Self-pay

## 2019-09-12 DIAGNOSIS — K7460 Unspecified cirrhosis of liver: Secondary | ICD-10-CM | POA: Insufficient documentation

## 2019-09-12 DIAGNOSIS — K922 Gastrointestinal hemorrhage, unspecified: Secondary | ICD-10-CM | POA: Diagnosis not present

## 2019-09-12 DIAGNOSIS — D500 Iron deficiency anemia secondary to blood loss (chronic): Secondary | ICD-10-CM | POA: Diagnosis not present

## 2019-09-12 DIAGNOSIS — Z8249 Family history of ischemic heart disease and other diseases of the circulatory system: Secondary | ICD-10-CM | POA: Insufficient documentation

## 2019-09-12 DIAGNOSIS — I10 Essential (primary) hypertension: Secondary | ICD-10-CM | POA: Diagnosis not present

## 2019-09-12 DIAGNOSIS — N183 Chronic kidney disease, stage 3 unspecified: Secondary | ICD-10-CM | POA: Insufficient documentation

## 2019-09-12 DIAGNOSIS — Z836 Family history of other diseases of the respiratory system: Secondary | ICD-10-CM | POA: Insufficient documentation

## 2019-09-12 DIAGNOSIS — Z8379 Family history of other diseases of the digestive system: Secondary | ICD-10-CM | POA: Diagnosis not present

## 2019-09-12 DIAGNOSIS — Z79899 Other long term (current) drug therapy: Secondary | ICD-10-CM | POA: Insufficient documentation

## 2019-09-12 DIAGNOSIS — K5900 Constipation, unspecified: Secondary | ICD-10-CM | POA: Insufficient documentation

## 2019-09-12 DIAGNOSIS — Z87891 Personal history of nicotine dependence: Secondary | ICD-10-CM | POA: Insufficient documentation

## 2019-09-12 DIAGNOSIS — R5383 Other fatigue: Secondary | ICD-10-CM | POA: Insufficient documentation

## 2019-09-12 LAB — VITAMIN B12: Vitamin B-12: 369 pg/mL (ref 180–914)

## 2019-09-12 LAB — COMPREHENSIVE METABOLIC PANEL
ALT: 22 U/L (ref 0–44)
AST: 28 U/L (ref 15–41)
Albumin: 4.3 g/dL (ref 3.5–5.0)
Alkaline Phosphatase: 83 U/L (ref 38–126)
Anion gap: 10 (ref 5–15)
BUN: 18 mg/dL (ref 8–23)
CO2: 27 mmol/L (ref 22–32)
Calcium: 9.5 mg/dL (ref 8.9–10.3)
Chloride: 101 mmol/L (ref 98–111)
Creatinine, Ser: 1.29 mg/dL — ABNORMAL HIGH (ref 0.44–1.00)
GFR calc Af Amer: 51 mL/min — ABNORMAL LOW (ref >60)
GFR calc non Af Amer: 44 mL/min — ABNORMAL LOW (ref >60)
Glucose, Bld: 104 mg/dL — ABNORMAL HIGH (ref 70–99)
Potassium: 3.8 mmol/L (ref 3.5–5.1)
Sodium: 138 mmol/L (ref 135–145)
Total Bilirubin: 0.6 mg/dL (ref 0.3–1.2)
Total Protein: 7.9 g/dL (ref 6.5–8.1)

## 2019-09-12 LAB — CBC WITH DIFFERENTIAL/PLATELET
Abs Immature Granulocytes: 0.02 10*3/uL (ref 0.00–0.07)
Basophils Absolute: 0 10*3/uL (ref 0.0–0.1)
Basophils Relative: 1 %
Eosinophils Absolute: 0.2 10*3/uL (ref 0.0–0.5)
Eosinophils Relative: 2 %
HCT: 39.1 % (ref 36.0–46.0)
Hemoglobin: 12.2 g/dL (ref 12.0–15.0)
Immature Granulocytes: 0 %
Lymphocytes Relative: 17 %
Lymphs Abs: 1.1 10*3/uL (ref 0.7–4.0)
MCH: 28.5 pg (ref 26.0–34.0)
MCHC: 31.2 g/dL (ref 30.0–36.0)
MCV: 91.4 fL (ref 80.0–100.0)
Monocytes Absolute: 0.5 10*3/uL (ref 0.1–1.0)
Monocytes Relative: 7 %
Neutro Abs: 4.7 10*3/uL (ref 1.7–7.7)
Neutrophils Relative %: 73 %
Platelets: 329 10*3/uL (ref 150–400)
RBC: 4.28 MIL/uL (ref 3.87–5.11)
RDW: 17 % — ABNORMAL HIGH (ref 11.5–15.5)
WBC: 6.4 10*3/uL (ref 4.0–10.5)
nRBC: 0 % (ref 0.0–0.2)

## 2019-09-12 LAB — IRON AND TIBC
Iron: 69 ug/dL (ref 28–170)
Saturation Ratios: 15 % (ref 10.4–31.8)
TIBC: 468 ug/dL — ABNORMAL HIGH (ref 250–450)
UIBC: 399 ug/dL

## 2019-09-12 LAB — VITAMIN D 25 HYDROXY (VIT D DEFICIENCY, FRACTURES): Vit D, 25-Hydroxy: 36.35 ng/mL (ref 30–100)

## 2019-09-12 LAB — FOLATE: Folate: 8.6 ng/mL (ref >5.9)

## 2019-09-12 LAB — FERRITIN: Ferritin: 21 ng/mL (ref 11–307)

## 2019-09-12 LAB — LACTATE DEHYDROGENASE: LDH: 182 U/L (ref 98–192)

## 2019-09-13 ENCOUNTER — Ambulatory Visit: Payer: Commercial Managed Care - PPO | Attending: Adult Health | Admitting: Physical Therapy

## 2019-09-13 ENCOUNTER — Other Ambulatory Visit: Payer: Self-pay

## 2019-09-13 ENCOUNTER — Encounter: Payer: Self-pay | Admitting: Physical Therapy

## 2019-09-13 DIAGNOSIS — R42 Dizziness and giddiness: Secondary | ICD-10-CM | POA: Diagnosis present

## 2019-09-13 NOTE — Therapy (Signed)
[facility] [address] [place], [place], [address] Phone: [phone]   Fax:  [phone]  Physical Therapy Treatment  Patient Details  Name: Deanna Thompson MRN: [id number] Date of Birth: 1955-12-10 Referring Provider (PT): Frann Rider, NP   Encounter Date: 09/13/2019   PT End of Session - 09/13/19 0829    Visit Number 2    Number of Visits 9    Date for PT Re-Evaluation 10/23/19    Authorization Type [employer] UMR, VL is 33 all disciplines combined    Authorization - Visit Number 2    Authorization - Number of Visits 30    PT Start Time 0803    PT Stop Time 0826    PT Time Calculation (min) 23 min    Activity Tolerance Patient tolerated treatment well    Behavior During Therapy [facility] for tasks assessed/performed           Past Medical History:  Diagnosis Date  . Abdominal hernia    repaired  . Abscess of pulp of tooth 12/12/14   left side  . Anxiety   . Arthritis    back  and down flank  right side more than left  . B12 deficiency 06/12/2015  . Brain aneurysm    METAL COIL PREVENTS MRI IMAGING and CLIP  . Cirrhosis ([place])    Presumably alcoholic cirrhosis  . Complication of anesthesia   . Depression   . ETOH abuse    Quit in 07/2011   . Fatigue    due to Sjogren's disease  . Folate deficiency 06/30/2016  . GERD (gastroesophageal reflux disease)   . HTN (hypertension)    no longer medicated, dated on 04/25/12  . IBS (irritable bowel syndrome)   . Iron deficiency anemia due to chronic blood loss   . Myofascial pain   . Panic attack   . PONV (postoperative nausea and vomiting)   . Psoriatic arthritis ([place])   . Right inguinal hernia 08/16/2012  . Seizures ([place]) 1997   1 seizure from coil insertion but no more seizures and no meds for seizures  . Sjogren's disease ([place])   . [place] HEAD 02/03/2007   Qualifier: Diagnosis of  By: Aline Brochure MD, Dorothyann Peng    . TIA (transient ischemic attack)   .  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([place])   . Vertigo     Past Surgical History:  Procedure Laterality Date  . AGILE CAPSULE N/A 06/21/2014   Procedure: AGILE CAPSULE;  Surgeon: Danie Binder, MD;  [facility];  Service: Endoscopy;  Laterality: N/A;  0700  . BIOPSY  10/13/2011   SLF: mild gastritis/duodenal polypoid lesion   . CEREBRAL ANEURYSM REPAIR  1997  . CHOLECYSTECTOMY N/A 10/28/2018   Procedure: LAPAROSCOPIC CHOLECYSTECTOMY WITH INTRAOPERATIVE CHOLANGIOGRAM;  Surgeon: Virl Cagey, MD;  Location: [facility];  Service: General;  Laterality: N/A;  . COLONOSCOPY  10/13/2011   NOI:[id number] hemorrhoids/varices rectal/lesion in ascending colon(HYPERPLASTIC POLYP)  . COLONOSCOPY N/A 01/17/2013   SLF: 1. Internal hemorrhoids 2. Ascending colon polyps 3.  Rectal varices.   . COLONOSCOPY N/A 08/10/2016   Dr. Oneida Alar: redundant left colon. external and internal hemorhroids. 5-10 year surveillance  . ESOPHAGOGASTRODUODENOSCOPY N/A 06/29/2014   SLF: 1. anemia due to polypoid lesions in teh antrum, and duodenal polyps 2. single duodenum AVM  . ESOPHAGOGASTRODUODENOSCOPY (EGD) WITH PROPOFOL N/A 03/07/2019   Procedure: ESOPHAGOGASTRODUODENOSCOPY (EGD) WITH PROPOFOL;  Surgeon: Danie Binder, MD;  [facility];  Service: Endoscopy;  Laterality: N/A;  1:00pm  . ESOPHAGOGASTRODUODENOSCOPY (EGD) WITH PROPOFOL N/A 03/16/2019   Procedure: ESOPHAGOGASTRODUODENOSCOPY (EGD) WITH PROPOFOL;  Surgeon: Danie Binder, MD;  [facility];  Service: Endoscopy;  Laterality: N/A;  . FLEXIBLE SIGMOIDOSCOPY N/A 01/21/2016   Procedure: FLEXIBLE SIGMOIDOSCOPY;  Surgeon: Danie Binder, MD;  [facility];  Service: Endoscopy;  Laterality: N/A;  245  . GIVENS CAPSULE STUDY N/A 06/29/2014   Procedure: GIVENS CAPSULE STUDY;  Surgeon: Danie Binder, MD;  [facility];  Service: Endoscopy;  Laterality: N/A;  . GIVENS CAPSULE STUDY N/A 07/13/2014   Procedure: GIVENS  CAPSULE STUDY;  Surgeon: Danie Binder, MD;  [facility];  Service: Endoscopy;  Laterality: N/A;  700  . HEMORRHOID BANDING N/A 01/21/2016   Procedure: HEMORRHOID BANDING;  Surgeon: Danie Binder, MD;  [facility];  Service: Endoscopy;  Laterality: N/A;  . HEMOSTASIS CLIP PLACEMENT  03/16/2019   Procedure: HEMOSTASIS CLIP PLACEMENT;  Surgeon: Danie Binder, MD;  [facility];  Service: Endoscopy;;  x 4  . HEMOSTASIS CONTROL  03/16/2019   Procedure: HEMOSTASIS CONTROL;  Surgeon: Danie Binder, MD;  [facility];  Service: Endoscopy;;  injection with epinephine 0.1mg /ml via scope and bipolar cautery with gold probe  . INGUINAL HERNIA REPAIR Right 08/28/2013   Procedure: RIGHT NGUINAL HERNIORRHAPHY WITH MESH;  Surgeon: Jamesetta So, MD;  Location: [facility];  Service: General;  Laterality: Right;  . INSERTION OF MESH N/A 06/09/2013   Procedure: INSERTION OF MESH;  Surgeon: Jamesetta So, MD;  Location: [facility];  Service: General;  Laterality: N/A;  . INSERTION OF MESH Right 08/28/2013   Procedure: INSERTION OF MESH;  Surgeon: Jamesetta So, MD;  Location: [facility];  Service: General;  Laterality: Right;  . PARACENTESIS  Feb 2014   10.7 liters  . TOOTH EXTRACTION Left 12/12/14  . UMBILICAL HERNIA REPAIR N/A 06/09/2013   Procedure: UMBILICAL HERNIORRHAPHY WITH MESH;  Surgeon: Jamesetta So, MD;  Location: [facility];  Service: General;  Laterality: N/A;    There were no vitals filed for this visit.   Subjective Assessment - 09/13/19 0807    Subjective Feeling much better.  Had two small episodes of spinning and fullness in head.  No nausea or vomiting.    Pertinent History abdominal hernia, anxiety, OA, B12 deficiency, brain aneurysm with metal coiling 1997, cirrhosis, depression, ETOH abuse, fatigue due to Sjogren's disease, HTN, IBS, anemia, panic attacks, myofascial pain, psoriatic arthritis, subluxation of radial head, TIA    Diagnostic tests can't have  MRI due to coiling    Patient Stated Goals To get rid of the dizziness again    Multiple Pain Sites No                   Vestibular Assessment - 09/13/19 0808      Positional Testing   Dix-Hallpike Dix-Hallpike Left    Sidelying Test Sidelying Right;Sidelying Left      Dix-Hallpike Left   Dix-Hallpike Left Duration 0    Dix-Hallpike Left Symptoms No nystagmus   no vertigo and no nystagmus     Sidelying Right   Sidelying Right Duration 0    Sidelying Right Symptoms No nystagmus      Sidelying Left   Sidelying Left Duration 10 seconds    Sidelying Left Symptoms No nystagmus   mild motion sensitivity  Vestibular Treatment/Exercise - 09/13/19 0817      Vestibular Treatment/Exercise   Vestibular Treatment Provided Habituation    Habituation Exercises Legrand Como Daroff   Number of Reps  1    Symptom Description  improved symptoms            Habituation - Tip Card  1.The goal of habituation training is to assist in decreasing symptoms of vertigo, dizziness, or nausea provoked by specific head and body motions. 2.These exercises may initially increase symptoms; however, be persistent and work through symptoms. With repetition and time, the exercises will assist in reducing or eliminating symptoms. 3.Exercises should be stopped and discussed with the therapist if you experience any of the following: - Sudden change or fluctuation in hearing - New onset of ringing in the ears, or increase in current intensity - Any fluid discharge from the ear - Severe pain in neck or back - Extreme nausea  Habituation - Sit to Side-Lying   Sit on edge of bed. Lie down onto the right side and hold until dizziness stops, plus 20 seconds.  Return to sitting and wait until dizziness stops, plus 20 seconds.  Repeat to the left side. Repeat sequence 5 times per session. Do 2 sessions per day.            PT Education - 09/13/19 [phone]      Education Details no evidence of ongoing BPPV today, motion sensitivity and initiation of habituation with Nestor Lewandowsky    Person(s) Educated Patient    Methods Explanation;Demonstration;Handout    Comprehension Verbalized understanding;Returned demonstration               PT Long Term Goals - 09/13/19 0831      PT LONG TERM GOAL #1   Title Patient will be independent with home CRM and habituation HEP    Time 8    Period Weeks    Status On-going      PT LONG TERM GOAL #2   Title Patient will have negative positional testing for BPPV to reduce dizziness during ADLs.    Baseline Positive for L posterior canal BPPV    Time 8    Period Weeks    Status Achieved      PT LONG TERM GOAL #3   Title Pt will report no dizziness with rolling or supine <> sit    Time 8    Period Weeks    Status On-going      PT LONG TERM GOAL #4   Title Pt will report overall function on FOTO to >/= 84% and decrease in [place] to </= 12    Baseline 70%: 30    Time 8    Period Weeks    Status On-going                 Plan - 09/13/19 0830    Clinical Impression Statement Pt returns today with resolution of vertigo and nystagmus during positional testing.  Pt continues to experience motion sensitivity.  Initiated Brandt-Daroff for habituation training.  If pt continues to report improvement in symptoms will proceed with D/C.  Pt agreeable to plan.    Personal Factors and Comorbidities Comorbidity 3+;Past/Current Experience    Comorbidities abdominal hernia, anxiety, OA, B12 deficiency, brain aneurysm with metal coiling 1997, cirrhosis, depression, ETOH abuse, fatigue due to Sjogren's disease, HTN, IBS, anemia, panic attacks, myofascial pain, psoriatic arthritis, subluxation of radial head, TIA    Examination-Activity Limitations  Bed Mobility    Stability/Clinical Decision Making Stable/Uncomplicated    Rehab Potential Excellent    PT Frequency 1x / week    PT Duration 8 weeks    PT  Treatment/Interventions ADLs/Self Care Home Management;Canalith Repostioning;Therapeutic activities;Neuromuscular re-education;Patient/family education;Vestibular    PT Next Visit Plan Any symptoms of L BPPV?  How is motion sensitivity?  D/C    Consulted and Agree with Plan of Care Patient           Patient will benefit from skilled therapeutic intervention in order to improve the following deficits and impairments:  Dizziness  Visit Diagnosis: Dizziness and giddiness     Problem List Patient Active Problem List   Diagnosis Date Noted  . IBS (irritable bowel syndrome)   . Symptomatic anemia 03/15/2019  . GI bleeding 03/14/2019  . Gastric AVM   . Postoperative anemia due to acute blood loss 10/29/2018  . Gallstone pancreatitis   . Elevated liver enzymes   . Non-intractable vomiting   . Abstinence from alcohol   . Cholecystitis, acute 10/20/2018  . Benign paroxysmal positional vertigo   . 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 Essential hypertension 07/31/2017  . GERD (gastroesophageal reflux disease) 07/31/2017  . Alcohol abuse 07/31/2017  . Brain aneurysm 07/31/2017  . CKD (chronic kidney disease), stage III 07/31/2017  . LBBB (left bundle branch block) 07/31/2017  . Dizziness 07/31/2017  . Folate deficiency 06/30/2016  . Constipation 04/01/2016  . 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 Hemorrhoids, internal, with bleeding 01/08/2016  . B12 deficiency 06/12/2015  . Psoriatic arthritis ([place]) 04/15/2015  . Nutritional anemia 09/19/2014  . Cirrhosis of liver ([place]) 09/19/2014  . Iron deficiency anemia due to chronic blood loss   . Insomnia 03/17/2014  . Depression 02/18/2014  . Labial swelling 08/23/2012  . Rash and nonspecific skin eruption 06/30/2012  . Colon polyps 11/18/2011  . Cirrhosis ([place]) 08/19/2011    Rico Junker, PT, DPT 09/13/19    8:33 AM    [facility] [address] [place], [place],  [address] Phone: [phone]   Fax:  [phone]  Name: Deanna Thompson MRN: [id number] Date of Birth: 06/30/1955

## 2019-09-13 NOTE — Patient Instructions (Signed)

## 2019-09-14 ENCOUNTER — Ambulatory Visit (HOSPITAL_COMMUNITY)
Admission: RE | Admit: 2019-09-14 | Discharge: 2019-09-14 | Disposition: A | Discharge status: Home / Self Care | Payer: Commercial Managed Care - PPO | Source: Ambulatory Visit | Attending: Family Medicine | Admitting: Family Medicine

## 2019-09-14 DIAGNOSIS — Z1231 Encounter for screening mammogram for malignant neoplasm of breast: Secondary | ICD-10-CM | POA: Insufficient documentation

## 2019-09-18 ENCOUNTER — Other Ambulatory Visit: Payer: Self-pay

## 2019-09-18 ENCOUNTER — Ambulatory Visit (HOSPITAL_COMMUNITY)
Admission: RE | Admit: 2019-09-18 | Discharge: 2019-09-18 | Disposition: A | Discharge status: Home / Self Care | Payer: Commercial Managed Care - PPO | Source: Ambulatory Visit | Attending: Gastroenterology | Admitting: Gastroenterology

## 2019-09-18 DIAGNOSIS — K7030 Alcoholic cirrhosis of liver without ascites: Secondary | ICD-10-CM | POA: Diagnosis present

## 2019-09-19 ENCOUNTER — Inpatient Hospital Stay (HOSPITAL_BASED_OUTPATIENT_CLINIC_OR_DEPARTMENT_OTHER): Payer: Commercial Managed Care - PPO | Admitting: Nurse Practitioner

## 2019-09-19 ENCOUNTER — Other Ambulatory Visit (HOSPITAL_COMMUNITY): Payer: Commercial Managed Care - PPO

## 2019-09-19 DIAGNOSIS — D500 Iron deficiency anemia secondary to blood loss (chronic): Secondary | ICD-10-CM

## 2019-09-19 NOTE — Patient Instructions (Signed)
[facility] at [facility] Discharge Instructions  Follow up in 3 months with lab s   Thank you for choosing [facility] at [facility] to provide your oncology and hematology care.  To afford each patient quality time with our provider, please arrive at least 15 minutes before your scheduled appointment time.   If you have a lab appointment with the Cancer Center please come in thru the Main Entrance and check in at the main information desk.  You need to re-schedule your appointment should you arrive 10 or more minutes late.  We strive to give you quality time with our providers, and arriving late affects you and other patients whose appointments are after yours.  Also, if you no show three or more times for appointments you may be dismissed from the clinic at the providers discretion.     Again, thank you for choosing [facility].  Our hope is that these requests will decrease the amount of time that you wait before being seen by our physicians.       _____________________________________________________________  Should you have questions after your visit to [facility], please contact our office at [phone] between the hours of 8:00 a.m. and 4:30 p.m.  Voicemails left after 4:00 p.m. will not be returned until the following business day.  For prescription refill requests, have your pharmacy contact our office and allow 72 hours.    Due to Covid, you will need to wear a mask upon entering the hospital. If you do not have a mask, a mask will be given to you at the Main Entrance upon arrival. For doctor visits, patients may have 1 support person with them. For treatment visits, patients can not have anyone with them due to social distancing guidelines and our immunocompromised population.      

## 2019-09-19 NOTE — Progress Notes (Signed)
[place], [place] [address]   CLINIC:  Medical Oncology/Hematology  PCP:  Mikey Kirschner, [place] [address] [phone]   REASON FOR VISIT: Follow-up for iron deficiency anemia   CURRENT THERAPY: Intermittent iron infusions   INTERVAL HISTORY:  Ms. Pardue 64 y.o. female returns for routine follow-up for iron deficiency anemia.  Patient reports she is doing well since her last visit.  She does report her fatigue is returning.  She denies any bright red bleeding per rectum or melena.  She denies any easy bruising or bleeding. Denies any nausea, vomiting, or diarrhea. Denies any new pains. Had not noticed any recent bleeding such as epistaxis, hematuria or hematochezia. Denies recent chest pain on exertion, shortness of breath on minimal exertion, pre-syncopal episodes, or palpitations. Denies any numbness or tingling in hands or feet. Denies any recent fevers, infections, or recent hospitalizations. Patient reports appetite at 100% and energy level at 75%.  She is eating well maintain her weight at this time.    REVIEW OF SYSTEMS:  Review of Systems  Constitutional: Positive for fatigue.  Gastrointestinal: Positive for constipation.  All other systems reviewed and are negative.    PAST MEDICAL/SURGICAL HISTORY:  Past Medical History:  Diagnosis Date  . Abdominal hernia    repaired  . Abscess of pulp of tooth 12/12/14   left side  . Anxiety   . Arthritis    back  and down flank  right side more than left  . B12 deficiency 06/12/2015  . Brain aneurysm    METAL COIL PREVENTS MRI IMAGING and CLIP  . Cirrhosis ([place])    Presumably alcoholic cirrhosis  . Complication of anesthesia   . Depression   . ETOH abuse    Quit in 07/2011   . Fatigue    due to Sjogren's disease  . Folate deficiency 06/30/2016  . GERD (gastroesophageal reflux disease)   . HTN (hypertension)    no longer medicated, dated on  04/25/12  . IBS (irritable bowel syndrome)   . Iron deficiency anemia due to chronic blood loss   . Myofascial pain   . Panic attack   . PONV (postoperative nausea and vomiting)   . Psoriatic arthritis ([place])   . Right inguinal hernia 08/16/2012  . Seizures ([place]) 1997   1 seizure from coil insertion but no more seizures and no meds for seizures  . Sjogren's disease ([place])   . [place] HEAD 02/03/2007   Qualifier: Diagnosis of  By: Aline Brochure MD, Dorothyann Peng    . TIA (transient ischemic attack)   . 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([place])   . Vertigo    Past Surgical History:  Procedure Laterality Date  . AGILE CAPSULE N/A 06/21/2014   Procedure: AGILE CAPSULE;  Surgeon: Danie Binder, MD;  [facility];  Service: Endoscopy;  Laterality: N/A;  0700  . BIOPSY  10/13/2011   SLF: mild gastritis/duodenal polypoid lesion   . CEREBRAL ANEURYSM REPAIR  1997  . CHOLECYSTECTOMY N/A 10/28/2018   Procedure: LAPAROSCOPIC CHOLECYSTECTOMY WITH INTRAOPERATIVE CHOLANGIOGRAM;  Surgeon: Virl Cagey, MD;  Location: [facility];  Service: General;  Laterality: N/A;  . COLONOSCOPY  10/13/2011   ZDG:[id number] hemorrhoids/varices rectal/lesion in ascending colon(HYPERPLASTIC POLYP)  . COLONOSCOPY N/A 01/17/2013   SLF: 1. Internal hemorrhoids 2. Ascending colon polyps 3.  Rectal varices.   . COLONOSCOPY N/A 08/10/2016   Dr. Oneida Alar: redundant left colon. external and internal hemorhroids.  5-10 year surveillance  . ESOPHAGOGASTRODUODENOSCOPY N/A 06/29/2014   SLF: 1. anemia due to polypoid lesions in teh antrum, and duodenal polyps 2. single duodenum AVM  . ESOPHAGOGASTRODUODENOSCOPY (EGD) WITH PROPOFOL N/A 03/07/2019   Procedure: ESOPHAGOGASTRODUODENOSCOPY (EGD) WITH PROPOFOL;  Surgeon: Danie Binder, MD;  [facility];  Service: Endoscopy;  Laterality: N/A;  1:00pm  . ESOPHAGOGASTRODUODENOSCOPY (EGD) WITH PROPOFOL N/A 03/16/2019   Procedure: ESOPHAGOGASTRODUODENOSCOPY (EGD) WITH  PROPOFOL;  Surgeon: Danie Binder, MD;  [facility];  Service: Endoscopy;  Laterality: N/A;  . FLEXIBLE SIGMOIDOSCOPY N/A 01/21/2016   Procedure: FLEXIBLE SIGMOIDOSCOPY;  Surgeon: Danie Binder, MD;  [facility];  Service: Endoscopy;  Laterality: N/A;  245  . GIVENS CAPSULE STUDY N/A 06/29/2014   Procedure: GIVENS CAPSULE STUDY;  Surgeon: Danie Binder, MD;  [facility];  Service: Endoscopy;  Laterality: N/A;  . GIVENS CAPSULE STUDY N/A 07/13/2014   Procedure: GIVENS CAPSULE STUDY;  Surgeon: Danie Binder, MD;  [facility];  Service: Endoscopy;  Laterality: N/A;  700  . HEMORRHOID BANDING N/A 01/21/2016   Procedure: HEMORRHOID BANDING;  Surgeon: Danie Binder, MD;  [facility];  Service: Endoscopy;  Laterality: N/A;  . HEMOSTASIS CLIP PLACEMENT  03/16/2019   Procedure: HEMOSTASIS CLIP PLACEMENT;  Surgeon: Danie Binder, MD;  [facility];  Service: Endoscopy;;  x 4  . HEMOSTASIS CONTROL  03/16/2019   Procedure: HEMOSTASIS CONTROL;  Surgeon: Danie Binder, MD;  [facility];  Service: Endoscopy;;  injection with epinephine 0.1mg /ml via scope and bipolar cautery with gold probe  . INGUINAL HERNIA REPAIR Right 08/28/2013   Procedure: RIGHT NGUINAL HERNIORRHAPHY WITH MESH;  Surgeon: Jamesetta So, MD;  Location: [facility];  Service: General;  Laterality: Right;  . INSERTION OF MESH N/A 06/09/2013   Procedure: INSERTION OF MESH;  Surgeon: Jamesetta So, MD;  Location[facility];  Service: General;  Laterality: N/A;  . INSERTION OF MESH Right 08/28/2013   Procedure: INSERTION OF MESH;  Surgeon: Jamesetta So, MD;  Location[facility];  Service: General;  Laterality: Right;  . PARACENTESIS  Feb 2014   10.7 liters  . TOOTH EXTRACTION Left 12/12/14  . UMBILICAL HERNIA REPAIR N/A 06/09/2013   Procedure: UMBILICAL HERNIORRHAPHY WITH MESH;  Surgeon: Jamesetta So, MD;  Location: [facility];  Service: General;  Laterality: N/A;      SOCIAL HISTORY:  Social History   Socioeconomic History  . Marital status: Married    Spouse name: Kris Mouton  . Number of children: 0  . Years of education: Not on file  . Highest education level: Not on file  Occupational History    Employer: RETIRED  Tobacco Use  . Smoking status: Former Smoker    Packs/day: 2.00    Years: 12.00    Pack years: 24.00    Types: Cigarettes    Quit date: 03/31/1995    Years since quitting: 24.4  . Smokeless tobacco: Never Used  . Tobacco comment: quit in 1997  Vaping Use  . Vaping Use: Never used  Substance and Sexual Activity  . Alcohol use: No    Comment: Bottle of wine a day for years, but quit in 07/2011  . Drug use: No  . Sexual activity: Not Currently    Birth control/protection: Post-menopausal  Other Topics Concern  . Not on file  Social History Narrative   Lives   Caffeine use:  Quit drinking alcohol 3 YEARS ago. No drugs, no IVDU.    Social Determinants of Health   Financial Resource Strain:   . Difficulty of Paying Living Expenses:   Food Insecurity:   . Worried About Charity fundraiser in the Last Year:   . Arboriculturist in the Last Year:   Transportation Needs:   . Film/video editor (Medical):   Marland Kitchen Lack of Transportation (Non-Medical):   Physical Activity:   . Days of Exercise per Week:   . Minutes of Exercise per Session:   Stress:   . Feeling of Stress :   Social Connections:   . Frequency of Communication with Friends and Family:   . Frequency of Social Gatherings with Friends and Family:   . Attends Religious Services:   . Active Member of Clubs or Organizations:   . Attends Archivist Meetings:   Marland Kitchen Marital Status:   Intimate Partner Violence:   . Fear of Current or Ex-Partner:   . Emotionally Abused:   Marland Kitchen Physically Abused:   . Sexually Abused:     FAMILY HISTORY:  Family History  Problem Relation Age of Onset  . Cirrhosis Mother        nash  . COPD Father   . Heart disease  Father   . Asthma Other   . Heart disease Other   . Colon cancer Neg Hx   . Inflammatory bowel disease Neg Hx     CURRENT MEDICATIONS:  Outpatient Encounter Medications as of 09/19/2019  Medication Sig  . aspirin EC 81 MG tablet Take 1 tablet (81 mg total) by mouth daily with breakfast.  . atorvastatin (LIPITOR) 40 MG tablet TAKE (1) TABLET BY MOUTH ONCE DAILY.  . bumetanide (BUMEX) 1 MG tablet TAKE (1) TABLET BY MOUTH TWICE DAILY.  Marland Kitchen buPROPion (WELLBUTRIN SR) 100 MG 12 hr tablet TAKE (1) TABLET BY MOUTH TWICE DAILY. (Patient taking differently: Take 100 mg by mouth 2 (two) times daily. )  . cholecalciferol (VITAMIN D3) 25 MCG (1000 UT) tablet Take 2 tablets (2,000 Units total) by mouth daily. (Patient taking differently: Take 1,000 Units by mouth every morning. )  . DULoxetine (CYMBALTA) 30 MG capsule Take 40 mg by mouth daily.   . famotidine (PEPCID) 20 MG tablet Take 1 tablet (20 mg total) by mouth 2 (two) times daily before a meal.  . fluticasone (FLONASE) 50 MCG/ACT nasal spray Place 1 spray into both nostrils See admin instructions. Instill 1 spray into each nostril every morning, may also use later in the day as needed for sinus congestion  . Lifitegrast (XIIDRA) 5 % SOLN Place 1 drop into both eyes 2 (two) times daily.  Marland Kitchen LINZESS 145 MCG CAPS capsule TAKE (1) CAPSULE BY MOUTH EVERY DAY BEFORE BREAKFAST.  Marland Kitchen loratadine (CLARITIN) 10 MG tablet Take 10 mg by mouth daily.  Marland Kitchen losartan (COZAAR) 25 MG tablet Take 1 tablet (25 mg total) by mouth daily.  . Multiple Vitamin (MULTIVITAMIN WITH MINERALS) TABS tablet Take 1 tablet by mouth daily.  . Polyvinyl Alcohol-Povidone (REFRESH OP) Place 1 drop into both eyes 3 (three) times daily as needed (dry eyes).  . Simethicone (GAS-X PO) Take 1 tablet by mouth 2 (two) times daily as needed (bloating/flatulence).  . sodium chloride (OCEAN) 0.65 % nasal spray Place 1 spray into the nose 2 (two) times daily as needed for congestion.   Marland Kitchen spironolactone  (ALDACTONE) 100 MG tablet Take 0.5 tablets (50 mg total) by mouth  2 (two) times daily. (Patient taking differently: Take 50 mg by mouth daily. )  . Tofacitinib Citrate ER (XELJANZ XR) 11 MG TB24 Take 11 mg by mouth daily.   Marland Kitchen ALPRAZolam (XANAX) 0.5 MG tablet Take 1/2 tablet po qhs prn sleep (Patient not taking: Reported on 09/19/2019)  . Camphor-Menthol-Methyl Sal (SALONPAS) 3.04-04-08 % PTCH Place 1 patch onto the skin daily as needed (pain.). (Patient not taking: Reported on 09/19/2019)  . escitalopram (LEXAPRO) 20 MG tablet TAKE (1) TABLET BY MOUTH AT BEDTIME. (Patient not taking: Reported on 08/24/2019)   No facility-administered encounter medications on file as of 09/19/2019.    ALLERGIES:  Allergies  Allergen Reactions  . Omeprazole Shortness Of Breath  . Orencia [Abatacept] Anaphylaxis, Hives and Rash  . Other Shortness Of Breath and Rash    Red Meat Causes shortness of breath, rash and flu like symptoms.   . Phenytoin Rash  . Keflex [Cephalexin] Other (See Comments)    Severe yeast infection  . Lidocaine Viscous Hcl     NAUSEA/PROLONGED NUMBNESS  . Phenergan [Promethazine Hcl] Other (See Comments)    Not in right state of mind, HALLUCINATION     PHYSICAL EXAM:  ECOG Performance status: 1  Vitals:   09/19/19 1103  BP: 132/67  Pulse: 86  Resp: 18  Temp: 99 F (37.2 C)  SpO2: 99%   Filed Weights   09/19/19 1103  Weight: 246 lb 12.8 oz (111.9 kg)   Physical Exam Constitutional:      Appearance: Normal appearance. She is normal weight.  Cardiovascular:     Rate and Rhythm: Normal rate and regular rhythm.     Heart sounds: Normal heart sounds.  Pulmonary:     Effort: Pulmonary effort is normal.     Breath sounds: Normal breath sounds.  Abdominal:     General: Bowel sounds are normal.     Palpations: Abdomen is soft.  Musculoskeletal:        General: Normal range of motion.  Skin:    General: Skin is warm.  Neurological:     Mental Status: She is alert and  oriented to person, place, and time. Mental status is at baseline.  Psychiatric:        Mood and Affect: Mood normal.        Behavior: Behavior normal.        Thought Content: Thought content normal.        Judgment: Judgment normal.      LABORATORY DATA:  I have reviewed the labs as listed.  CBC    Component Value Date/Time   WBC 6.4 09/12/2019 1327   RBC 4.28 09/12/2019 1327   HGB 12.2 09/12/2019 1327   HGB 9.2 (L) 03/27/2019 0854   HCT 39.1 09/12/2019 1327   HCT 28.7 (L) 03/27/2019 0854   HCT 41 07/30/2011 0743   PLT 329 09/12/2019 1327   PLT 376 03/27/2019 0854   MCV 91.4 09/12/2019 1327   MCV 89 03/27/2019 0854   MCV 104.3 07/30/2011 0743   MCH 28.5 09/12/2019 1327   MCHC 31.2 09/12/2019 1327   RDW 17.0 (H) 09/12/2019 1327   RDW 15.8 (H) 03/27/2019 0854   LYMPHSABS 1.1 09/12/2019 1327   LYMPHSABS 1.3 03/27/2019 0854   MONOABS 0.5 09/12/2019 1327   EOSABS 0.2 09/12/2019 1327   EOSABS 0.2 03/27/2019 0854   BASOSABS 0.0 09/12/2019 1327   BASOSABS 0.1 03/27/2019 0854   CMP Latest Ref Rng & Units 09/12/2019 06/13/2019 04/03/2019  Glucose 70 -  99 mg/dL 104(H) 99 112(H)  BUN 8 - 23 mg/dL 18 24(H) 18  Creatinine 0.44 - 1.00 mg/dL 1.29(H) 1.33(H) 1.25(H)  Sodium 135 - 145 mmol/L 138 139 136  Potassium 3.5 - 5.1 mmol/L 3.8 4.2 3.9  Chloride 98 - 111 mmol/L 101 100 101  CO2 22 - 32 mmol/L 27 27 23   Calcium 8.9 - 10.3 mg/dL 9.5 9.9 9.2  Total Protein 6.5 - 8.1 g/dL 7.9 8.4(H) 7.8  Total Bilirubin 0.3 - 1.2 mg/dL 0.6 0.5 0.7  Alkaline Phos 38 - 126 U/L 83 77 66  AST 15 - 41 U/L 28 33 29  ALT 0 - 44 U/L 22 32 31    All questions were answered to patient's stated satisfaction. Encouraged patient to call with any new concerns or questions before his next visit to the cancer center and we can certain see him sooner, if needed.     ASSESSMENT & PLAN:  Iron deficiency anemia due to chronic blood loss 1.  Iron deficiency anemia: - Due to chronic blood loss secondary to  chronic GI blood loss. 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  Also showed polypoid lesions in the duodenum, single duodenum AVM. - Her colonoscopy on 01/17/2013 showed rectal varices, polyps and hemorrhoids. - She had a flex sigmoidoscopy with Dr. Oneida Alar on 01/21/2016 showing rectal bleeding due to proctitis and possibly internal hemorrhoids. -She had her gallbladder removed September 2020 due to a lodged stone and inflammation.  Per the patient she lost about a unit of blood at that time. -Patient was recently hospitalized in December 2020 for an AVM bleed where her hemoglobin dropped to 7.7. 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  Injected.  Treated with bipolar cautery.  Clips were placed. -She last received 4 doses of Venofer from 04/04/2019 through 05/05/2019. -She had Covid in between her 4 doses. -Labs done on 09/12/2019 shows hemoglobin 12.2, ferritin 21, percent saturation 15 -We will give her 2 doses of Venofer due to her fatigue. - She will follow-up in 3 months with repeat labs.  2.  Chronic kidney disease: -Creatinine is 1.29 -We will continue to monitor.     Orders placed this encounter:  Orders Placed This Encounter  Procedures  . Lactate dehydrogenase  . CBC with Differential/Platelet  . Comprehensive metabolic panel  . Ferritin  . Iron and TIBC  . Vitamin B12  . VITAMIN D 25 Hydroxy (Vit-D Deficiency, Fractures)      Francene Finders, FNP-C [place] [phone]

## 2019-09-19 NOTE — Assessment & Plan Note (Signed)
1.  Iron deficiency anemia: - Due to chronic blood loss secondary to chronic GI blood loss. 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  Also showed polypoid lesions in the duodenum, single duodenum AVM. - Her colonoscopy on 01/17/2013 showed rectal varices, polyps and hemorrhoids. - She had a flex sigmoidoscopy with Dr. Oneida Alar on 01/21/2016 showing rectal bleeding due to proctitis and possibly internal hemorrhoids. -She had her gallbladder removed September 2020 due to a lodged stone and inflammation.  Per the patient she lost about a unit of blood at that time. -Patient was recently hospitalized in December 2020 for an AVM bleed where her hemoglobin dropped to 7.7. 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  Injected.  Treated with bipolar cautery.  Clips were placed. -She last received 4 doses of Venofer from 04/04/2019 through 05/05/2019. -She had Covid in between her 4 doses. -Labs done on 09/12/2019 shows hemoglobin 12.2, ferritin 21, percent saturation 15 -We will give her 2 doses of Venofer due to her fatigue. - She will follow-up in 3 months with repeat labs.  2.  Chronic kidney disease: -Creatinine is 1.29 -We will continue to monitor.

## 2019-09-21 ENCOUNTER — Ambulatory Visit: Payer: Commercial Managed Care - PPO | Admitting: Physical Therapy

## 2019-09-21 ENCOUNTER — Encounter: Payer: Self-pay | Admitting: Physical Therapy

## 2019-09-21 ENCOUNTER — Other Ambulatory Visit: Payer: Self-pay

## 2019-09-21 DIAGNOSIS — R42 Dizziness and giddiness: Secondary | ICD-10-CM

## 2019-09-21 NOTE — Therapy (Signed)
[facility] [address] [place], [place], [address] Phone: [phone]   Fax:  [phone]  Physical Therapy Treatment and D/C Summary  Patient Details  Name: Deanna Thompson MRN: [id number] Date of Birth: [birth year] Referring Provider (PT): Frann Rider, NP   Encounter Date: 09/21/2019   PT End of Session - 09/21/19 0852    Visit Number 3    Number of Visits 9    Date for PT Re-Evaluation 10/23/19    Authorization Type [employer] UMR, VL is 55 all disciplines combined    Authorization - Visit Number 3    Authorization - Number of Visits 30    PT Start Time 0803    PT Stop Time 0830    PT Time Calculation (min) 27 min    Activity Tolerance Patient tolerated treatment well    Behavior During Therapy [facility] for tasks assessed/performed           Past Medical History:  Diagnosis Date  . Abdominal hernia    repaired  . Abscess of pulp of tooth 12/12/14   left side  . Anxiety   . Arthritis    back  and down flank  right side more than left  . B12 deficiency 06/12/2015  . Brain aneurysm    METAL COIL PREVENTS MRI IMAGING and CLIP  . Cirrhosis ([place])    Presumably alcoholic cirrhosis  . Complication of anesthesia   . Depression   . ETOH abuse    Quit in 07/2011   . Fatigue    due to Sjogren's disease  . Folate deficiency 06/30/2016  . GERD (gastroesophageal reflux disease)   . HTN (hypertension)    no longer medicated, dated on 04/25/12  . IBS (irritable bowel syndrome)   . Iron deficiency anemia due to chronic blood loss   . Myofascial pain   . Panic attack   . PONV (postoperative nausea and vomiting)   . Psoriatic arthritis ([place])   . Right inguinal hernia 08/16/2012  . Seizures ([place]) 1997   1 seizure from coil insertion but no more seizures and no meds for seizures  . Sjogren's disease ([place])   . [place] HEAD 02/03/2007   Qualifier: Diagnosis of  By: Aline Brochure MD, Dorothyann Peng    . TIA (transient ischemic  attack)   . 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([place])   . Vertigo     Past Surgical History:  Procedure Laterality Date  . AGILE CAPSULE N/A 06/21/2014   Procedure: AGILE CAPSULE;  Surgeon: Danie Binder, MD;  [facility];  Service: Endoscopy;  Laterality: N/A;  0700  . BIOPSY  10/13/2011   SLF: mild gastritis/duodenal polypoid lesion   . CEREBRAL ANEURYSM REPAIR  1997  . CHOLECYSTECTOMY N/A 10/28/2018   Procedure: LAPAROSCOPIC CHOLECYSTECTOMY WITH INTRAOPERATIVE CHOLANGIOGRAM;  Surgeon: Virl Cagey, MD;  Location: [facility] ORS;  Service: General;  Laterality: N/A;  . COLONOSCOPY  10/13/2011   TSV:[id number] hemorrhoids/varices rectal/lesion in ascending colon(HYPERPLASTIC POLYP)  . COLONOSCOPY N/A 01/17/2013   SLF: 1. Internal hemorrhoids 2. Ascending colon polyps 3.  Rectal varices.   . COLONOSCOPY N/A 08/10/2016   Dr. Oneida Alar: redundant left colon. external and internal hemorhroids. 5-10 year surveillance  . ESOPHAGOGASTRODUODENOSCOPY N/A 06/29/2014   SLF: 1. anemia due to polypoid lesions in teh antrum, and duodenal polyps 2. single duodenum AVM  . ESOPHAGOGASTRODUODENOSCOPY (EGD) WITH PROPOFOL N/A 03/07/2019   Procedure: ESOPHAGOGASTRODUODENOSCOPY (EGD) WITH PROPOFOL;  Surgeon: Danie Binder, MD;  [facility];  Service: Endoscopy;  Laterality: N/A;  1:00pm  . ESOPHAGOGASTRODUODENOSCOPY (EGD) WITH PROPOFOL N/A 03/16/2019   Procedure: ESOPHAGOGASTRODUODENOSCOPY (EGD) WITH PROPOFOL;  Surgeon: Danie Binder, MD;  [facility];  Service: Endoscopy;  Laterality: N/A;  . FLEXIBLE SIGMOIDOSCOPY N/A 01/21/2016   Procedure: FLEXIBLE SIGMOIDOSCOPY;  Surgeon: Danie Binder, MD;  [facility];  Service: Endoscopy;  Laterality: N/A;  245  . GIVENS CAPSULE STUDY N/A 06/29/2014   Procedure: GIVENS CAPSULE STUDY;  Surgeon: Danie Binder, MD;  [facility];  Service: Endoscopy;  Laterality: N/A;  . GIVENS CAPSULE STUDY N/A 07/13/2014    Procedure: GIVENS CAPSULE STUDY;  Surgeon: Danie Binder, MD;  [facility];  Service: Endoscopy;  Laterality: N/A;  700  . HEMORRHOID BANDING N/A 01/21/2016   Procedure: HEMORRHOID BANDING;  Surgeon: Danie Binder, MD;  [facility];  Service: Endoscopy;  Laterality: N/A;  . HEMOSTASIS CLIP PLACEMENT  03/16/2019   Procedure: HEMOSTASIS CLIP PLACEMENT;  Surgeon: Danie Binder, MD;  [facility];  Service: Endoscopy;;  x 4  . HEMOSTASIS CONTROL  03/16/2019   Procedure: HEMOSTASIS CONTROL;  Surgeon: Danie Binder, MD;  [facility];  Service: Endoscopy;;  injection with epinephine 0.57m/ml via scope and bipolar cautery with gold probe  . INGUINAL HERNIA REPAIR Right 08/28/2013   Procedure: RIGHT NGUINAL HERNIORRHAPHY WITH MESH;  Surgeon: MJamesetta So MD;  Location: [facility];  Service: General;  Laterality: Right;  . INSERTION OF MESH N/A 06/09/2013   Procedure: INSERTION OF MESH;  Surgeon: MJamesetta So MD;  Location: [facility];  Service: General;  Laterality: N/A;  . INSERTION OF MESH Right 08/28/2013   Procedure: INSERTION OF MESH;  Surgeon: MJamesetta So MD;  Location: [facility];  Service: General;  Laterality: Right;  . PARACENTESIS  Feb 2014   10.7 liters  . TOOTH EXTRACTION Left 12/12/14  . UMBILICAL HERNIA REPAIR N/A 06/09/2013   Procedure: UMBILICAL HERNIORRHAPHY WITH MESH;  Surgeon: MJamesetta So MD;  Location: [facility];  Service: General;  Laterality: N/A;    There were no vitals filed for this visit.   Subjective Assessment - 09/21/19 0808    Subjective Doesn't think she has had any real episodes of vertigo but is having mild wooziness and episodes of watching TV or scrolling and feeling slight dizziness.  No changes in vision.  Still getting fullness in the head.    Pertinent History abdominal hernia, anxiety, OA, B12 deficiency, brain aneurysm with metal coiling 1997, cirrhosis, depression, ETOH abuse, fatigue due to Sjogren's disease, HTN,  IBS, anemia, panic attacks, myofascial pain, psoriatic arthritis, subluxation of radial head, TIA    Diagnostic tests can't have MRI due to coiling    Patient Stated Goals To get rid of the dizziness again    Currently in Pain? No/denies                   Vestibular Assessment - 09/21/19 0816      Positional Testing   Dix-Hallpike Dix-Hallpike Left;Dix-Hallpike Right    Horizontal Canal Testing Horizontal Canal Right;Horizontal Canal Left      Dix-Hallpike Right   Dix-Hallpike Right Duration 0    Dix-Hallpike Right Symptoms No nystagmus      Dix-Hallpike Left   Dix-Hallpike Left Duration 0    Dix-Hallpike Left Symptoms No nystagmus      Horizontal Canal Right   Horizontal Canal Right Duration 0  Horizontal Canal Right Symptoms Normal      Horizontal Canal Left   Horizontal Canal Left Duration 0    Horizontal Canal Left Symptoms Normal                     Vestibular Treatment/Exercise - 09/21/19 0825      Vestibular Treatment/Exercise   Vestibular Treatment Provided Habituation    Habituation Exercises Nestor Lewandowsky    Gaze Exercises X1 Viewing Horizontal;X1 Viewing Vertical      Nestor Lewandowsky   Symptom Description  reviewed verbally and recommended pt perform for 1-2 more weeks until symptoms subside      X1 Viewing Horizontal   Comments reviewed verbally and demonstrated to pt.  Pt reported she did not need to perform/practice - reports she remembers how to perform.  Pt still has instructions at home.                 PT Education - 09/21/19 0841    Education Details no evidence of BPPV; symptoms still most consistent with motion sensitivity.  Recommended pt continue Nestor Lewandowsky and initiate x1 viewing for 1-2 weeks.  Educated pt on oculomotor impairments remaining after CVA and how they may be affecting her sense of dizziness when watching TV or scrolling.    Person(s) Educated Patient    Methods Explanation;Demonstration     Comprehension Verbalized understanding               PT Long Term Goals - 09/21/19 0855      PT LONG TERM GOAL #1   Title Patient will be independent with home CRM and habituation HEP    Time 8    Period Weeks    Status Achieved      PT LONG TERM GOAL #2   Title Patient will have negative positional testing for BPPV to reduce dizziness during ADLs.    Baseline Positive for L posterior canal BPPV    Time 8    Period Weeks    Status Achieved      PT LONG TERM GOAL #3   Title Pt will report no dizziness with rolling or supine <> sit    Time 8    Period Weeks    Status Achieved      PT LONG TERM GOAL #4   Title Pt will report overall function on FOTO to >/= 84% and decrease in [place] to </= 12    Baseline 70%: 30    Time 8    Period Weeks    Status Unable to assess                 Plan - 09/21/19 3662    Clinical Impression Statement Pt continues to demonstrates resolution of positional vertigo and ongoing motion sensitivity.  Reviewed exercise recommendations and indications for returning to therapy.  Pt is comfortable with cancelling remaining visits.  Pt has made excellent progess and has met 4/5 LTG, final FOTO goal not captured today.  Pt is safe for D/C today.    Personal Factors and Comorbidities Comorbidity 3+;Past/Current Experience    Comorbidities abdominal hernia, anxiety, OA, B12 deficiency, brain aneurysm with metal coiling 1997, cirrhosis, depression, ETOH abuse, fatigue due to Sjogren's disease, HTN, IBS, anemia, panic attacks, myofascial pain, psoriatic arthritis, subluxation of radial head, TIA    Examination-Activity Limitations Bed Mobility    Stability/Clinical Decision Making Stable/Uncomplicated    Rehab Potential Excellent    PT Frequency 1x / week  PT Duration 8 weeks    PT Treatment/Interventions ADLs/Self Care Home Management;Canalith Repostioning;Therapeutic activities;Neuromuscular re-education;Patient/family education;Vestibular    PT  Next Visit Plan --    Consulted and Agree with Plan of Care Patient           Patient will benefit from skilled therapeutic intervention in order to improve the following deficits and impairments:  Dizziness  Visit Diagnosis: Dizziness and giddiness     Problem List Patient Active Problem List   Diagnosis Date Noted  . IBS (irritable bowel syndrome)   . Symptomatic anemia 03/15/2019  . GI bleeding 03/14/2019  . Gastric AVM   . Postoperative anemia due to acute blood loss 10/29/2018  . Gallstone pancreatitis   . Elevated liver enzymes   . Non-intractable vomiting   . Abstinence from alcohol   . Cholecystitis, acute 10/20/2018  . Benign paroxysmal positional vertigo   . 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 Essential hypertension 07/31/2017  . GERD (gastroesophageal reflux disease) 07/31/2017  . Alcohol abuse 07/31/2017  . Brain aneurysm 07/31/2017  . CKD (chronic kidney disease), stage III 07/31/2017  . LBBB (left bundle branch block) 07/31/2017  . Dizziness 07/31/2017  . Folate deficiency 06/30/2016  . Constipation 04/01/2016  . 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 Hemorrhoids, internal, with bleeding 01/08/2016  . B12 deficiency 06/12/2015  . Psoriatic arthritis ([place]) 04/15/2015  . Nutritional anemia 09/19/2014  . Cirrhosis of liver ([place]) 09/19/2014  . Iron deficiency anemia due to chronic blood loss   . Insomnia 03/17/2014  . Depression 02/18/2014  . Labial swelling 08/23/2012  . Rash and nonspecific skin eruption 06/30/2012  . Colon polyps 11/18/2011  . Cirrhosis ([place]) 08/19/2011    PHYSICAL THERAPY DISCHARGE SUMMARY  Visits from Start of Care: 3   Current functional level related to goals / functional outcomes: See LTG achievement and impression statement above   Remaining deficits: Motion sensitivity   Education / Equipment: HEP  Plan: Patient agrees to discharge.  Patient goals were met. Patient is being discharged due to meeting the stated rehab  goals.  ?????     Rico Junker, PT, DPT 09/21/19    8:57 AM    [facility] [address] [place], [place], [address] Phone: [phone]   Fax:  [phone]  Name: Deanna Thompson MRN: [id number] Date of Birth: [birth year]

## 2019-09-27 ENCOUNTER — Ambulatory Visit: Payer: Commercial Managed Care - PPO | Admitting: Physical Therapy

## 2019-09-28 ENCOUNTER — Ambulatory Visit (HOSPITAL_COMMUNITY): Payer: Commercial Managed Care - PPO

## 2019-10-03 ENCOUNTER — Inpatient Hospital Stay (HOSPITAL_COMMUNITY): Payer: Commercial Managed Care - PPO | Attending: Hematology

## 2019-10-03 ENCOUNTER — Encounter (HOSPITAL_COMMUNITY): Payer: Self-pay

## 2019-10-03 ENCOUNTER — Other Ambulatory Visit: Payer: Self-pay

## 2019-10-03 VITALS — BP 102/52 | HR 75 | Temp 97.9°F | Resp 18

## 2019-10-03 DIAGNOSIS — Z8249 Family history of ischemic heart disease and other diseases of the circulatory system: Secondary | ICD-10-CM | POA: Insufficient documentation

## 2019-10-03 DIAGNOSIS — N189 Chronic kidney disease, unspecified: Secondary | ICD-10-CM | POA: Diagnosis not present

## 2019-10-03 DIAGNOSIS — Z79899 Other long term (current) drug therapy: Secondary | ICD-10-CM | POA: Diagnosis not present

## 2019-10-03 DIAGNOSIS — Z8379 Family history of other diseases of the digestive system: Secondary | ICD-10-CM | POA: Insufficient documentation

## 2019-10-03 DIAGNOSIS — Z836 Family history of other diseases of the respiratory system: Secondary | ICD-10-CM | POA: Insufficient documentation

## 2019-10-03 DIAGNOSIS — Z87891 Personal history of nicotine dependence: Secondary | ICD-10-CM | POA: Insufficient documentation

## 2019-10-03 DIAGNOSIS — K5900 Constipation, unspecified: Secondary | ICD-10-CM | POA: Insufficient documentation

## 2019-10-03 DIAGNOSIS — R5383 Other fatigue: Secondary | ICD-10-CM | POA: Insufficient documentation

## 2019-10-03 DIAGNOSIS — D500 Iron deficiency anemia secondary to blood loss (chronic): Secondary | ICD-10-CM | POA: Insufficient documentation

## 2019-10-03 DIAGNOSIS — K922 Gastrointestinal hemorrhage, unspecified: Secondary | ICD-10-CM | POA: Diagnosis not present

## 2019-10-03 MED ORDER — SODIUM CHLORIDE 0.9 % IV SOLN: INTRAVENOUS | Status: DC

## 2019-10-03 MED ORDER — SODIUM CHLORIDE 0.9 % IV SOLN
200.0000 mg | Freq: Once | INTRAVENOUS | Status: AC
  Administered 2019-10-03: 200 mg via INTRAVENOUS
  Filled 2019-10-03: qty 200

## 2019-10-03 NOTE — Progress Notes (Signed)
[place] Kisiel tolerated Venofer infusion well without complaints or incident. Peripheral IV site checked with positive blood return noted prior to and after infusion. Pt discharged self ambulatory in satisfactory condition

## 2019-10-03 NOTE — Patient Instructions (Signed)
[facility] °Discharge Instructions °Received Venofer infusion today. Follow-up as scheduled ° ° °Thank you for choosing  [facility] at [facility] to provide your oncology and hematology care.  To afford each patient quality time with our provider, please arrive at least 15 minutes before your scheduled appointment time.  ° °If you have a lab appointment with the Cancer Center please come in thru the Main Entrance and check in at the main information desk. ° °You need to re-schedule your appointment should you arrive 10 or more minutes late.  We strive to give you quality time with our providers, and arriving late affects you and other patients whose appointments are after yours.  Also, if you no show three or more times for appointments you may be dismissed from the clinic at the providers discretion.     °Again, thank you for choosing [facility].  Our hope is that these requests will decrease the amount of time that you wait before being seen by our physicians.       °_____________________________________________________________ ° °Should you have questions after your visit to [facility], please contact our office at [phone] between the hours of 8:00 a.m. and 4:30 p.m.  Voicemails left after 4:00 p.m. will not be returned until the following business day.  For prescription refill requests, have your pharmacy contact our office and allow 72 hours.   ° °Due to Covid, you will need to wear a mask upon entering the hospital. If you do not have a mask, a mask will be given to you at the Main Entrance upon arrival. For doctor visits, patients may have 1 support person with them. For treatment visits, patients can not have anyone with them due to social distancing guidelines and our immunocompromised population.  ° ° ° °

## 2019-10-04 ENCOUNTER — Encounter: Payer: Commercial Managed Care - PPO | Admitting: Physical Therapy

## 2019-10-06 ENCOUNTER — Other Ambulatory Visit: Payer: Self-pay

## 2019-10-06 ENCOUNTER — Inpatient Hospital Stay (HOSPITAL_COMMUNITY): Payer: Commercial Managed Care - PPO

## 2019-10-06 ENCOUNTER — Encounter (HOSPITAL_COMMUNITY): Payer: Self-pay

## 2019-10-06 VITALS — BP 119/72 | HR 71 | Temp 97.3°F | Resp 18

## 2019-10-06 DIAGNOSIS — D500 Iron deficiency anemia secondary to blood loss (chronic): Secondary | ICD-10-CM | POA: Diagnosis not present

## 2019-10-06 MED ORDER — SODIUM CHLORIDE 0.9 % IV SOLN
200.0000 mg | Freq: Once | INTRAVENOUS | Status: AC
  Administered 2019-10-06: 200 mg via INTRAVENOUS
  Filled 2019-10-06: qty 10

## 2019-10-06 MED ORDER — SODIUM CHLORIDE 0.9 % IV SOLN: INTRAVENOUS | Status: DC

## 2019-10-06 MED ORDER — SODIUM CHLORIDE 0.9 % IV SOLN: 200.0000 mg | Freq: Once | INTRAVENOUS | Status: DC

## 2019-10-06 NOTE — Patient Instructions (Signed)
[facility]  Discharge Instructions:   _______________________________________________________________  Thank you for choosing [facility] to provide your oncology and hematology care.  To afford each patient quality time with our providers, please arrive at least 15 minutes before your scheduled appointment.  You need to re-schedule your appointment if you arrive 10 or more minutes late.  We strive to give you quality time with our providers, and arriving late affects you and other patients whose appointments are after yours.  Also, if you no show three or more times for appointments you may be dismissed from the clinic.  Again, thank you for choosing [facility]. Our hope is that these requests will allow you access to exceptional care and in a timely manner. _______________________________________________________________  If you have questions after your visit, please contact our office at [phone] between the hours of 8:30 a.m. and 5:00 p.m. Voicemails left after 4:30 p.m. will not be returned until the following business day. _______________________________________________________________  For prescription refill requests, have your pharmacy contact our office. _______________________________________________________________  Recommendations made by the consultant and any test results will be sent to your referring physician. _______________________________________________________________ 

## 2019-10-06 NOTE — Progress Notes (Signed)
Iron infusion given per orders. Patient tolerated it well without problems. Vitals stable and discharged home from clinic ambulatory. Follow up as scheduled.  

## 2019-10-13 ENCOUNTER — Telehealth: Payer: Self-pay | Admitting: *Deleted

## 2019-10-13 NOTE — Telephone Encounter (Signed)
Discussed with patient including risks of Hep B with non-sterile tattoos. States she's had several friends who have gone there and they verified it was sterile. Advised her to ask to see all instruments and inks unsealed as new/sterile. She has been vaccinated against Hep A/B 8 years ago. Normal plt count, last INR normal unlikely bleeding risk.

## 2019-10-13 NOTE — Telephone Encounter (Signed)
Pt has cirrhosis and says liver studies are fine (per pt).  She wants to know if she is safe to get a tattoo.  [phone]

## 2019-10-17 ENCOUNTER — Telehealth: Payer: Self-pay

## 2019-10-17 ENCOUNTER — Other Ambulatory Visit: Payer: Self-pay | Admitting: Gastroenterology

## 2019-10-17 DIAGNOSIS — K219 Gastro-esophageal reflux disease without esophagitis: Secondary | ICD-10-CM

## 2019-10-17 MED ORDER — FAMOTIDINE 20 MG PO TABS: 20.0000 mg | ORAL_TABLET | Freq: Two times a day (BID) | ORAL | 5 refills | Status: AC

## 2019-10-17 NOTE — Telephone Encounter (Signed)
Rx sent 

## 2019-10-17 NOTE — Telephone Encounter (Signed)
Noted  

## 2019-10-17 NOTE — Telephone Encounter (Signed)
Refill request received from [facility] for Famotidine 20 mg po bid.

## 2019-11-06 ENCOUNTER — Other Ambulatory Visit: Payer: Self-pay | Admitting: Family Medicine

## 2019-11-07 NOTE — Telephone Encounter (Signed)
Scheduled 9/2.   Pt would also like to know if she needs lab work done. She is wondering if she needs lipid checked.

## 2019-11-09 ENCOUNTER — Other Ambulatory Visit: Payer: Self-pay | Admitting: *Deleted

## 2019-11-09 DIAGNOSIS — E785 Hyperlipidemia, unspecified: Secondary | ICD-10-CM

## 2019-11-09 NOTE — Telephone Encounter (Signed)
Just lipid panel, fasting, is only labs she needs. Pls order. Thx. Dr. Darene Lamer

## 2019-11-21 LAB — LIPID PANEL
Chol/HDL Ratio: 1.8 ratio (ref 0.0–4.4)
Cholesterol, Total: 157 mg/dL (ref 100–199)
HDL: 85 mg/dL (ref >39)
LDL Chol Calc (NIH): 56 mg/dL (ref 0–99)
Triglycerides: 86 mg/dL (ref 0–149)
VLDL Cholesterol Cal: 16 mg/dL (ref 5–40)

## 2019-11-30 ENCOUNTER — Ambulatory Visit (INDEPENDENT_AMBULATORY_CARE_PROVIDER_SITE_OTHER): Payer: Commercial Managed Care - PPO | Admitting: Family Medicine

## 2019-11-30 ENCOUNTER — Other Ambulatory Visit: Payer: Self-pay

## 2019-11-30 VITALS — BP 126/74 | HR 86 | Temp 97.4°F | Ht 68.0 in | Wt 252.0 lb

## 2019-11-30 DIAGNOSIS — L4050 Arthropathic psoriasis, unspecified: Secondary | ICD-10-CM | POA: Diagnosis not present

## 2019-11-30 DIAGNOSIS — N1831 Chronic kidney disease, stage 3a: Secondary | ICD-10-CM

## 2019-11-30 DIAGNOSIS — D500 Iron deficiency anemia secondary to blood loss (chronic): Secondary | ICD-10-CM | POA: Diagnosis not present

## 2019-11-30 DIAGNOSIS — K7030 Alcoholic cirrhosis of liver without ascites: Secondary | ICD-10-CM

## 2019-11-30 DIAGNOSIS — I10 Essential (primary) hypertension: Secondary | ICD-10-CM | POA: Diagnosis not present

## 2019-11-30 DIAGNOSIS — L219 Seborrheic dermatitis, unspecified: Secondary | ICD-10-CM

## 2019-11-30 DIAGNOSIS — Z8673 Personal history of transient ischemic attack (TIA), and cerebral infarction without residual deficits: Secondary | ICD-10-CM

## 2019-11-30 MED ORDER — SPIRONOLACTONE 100 MG PO TABS: 50.0000 mg | ORAL_TABLET | Freq: Two times a day (BID) | ORAL | 1 refills | Status: DC

## 2019-11-30 MED ORDER — HYDROCORTISONE 2.5 % EX OINT: TOPICAL_OINTMENT | Freq: Two times a day (BID) | CUTANEOUS | 1 refills | Status: DC

## 2019-11-30 MED ORDER — ATORVASTATIN CALCIUM 40 MG PO TABS: ORAL_TABLET | ORAL | 1 refills | Status: DC

## 2019-11-30 MED ORDER — LOSARTAN POTASSIUM 25 MG PO TABS: 25.0000 mg | ORAL_TABLET | Freq: Every day | ORAL | 1 refills | Status: DC

## 2019-11-30 NOTE — Progress Notes (Signed)
Patient ID: Deanna Thompson, female    DOB: 03-30-1956, 64 y.o.   MRN: [id number]   Chief Complaint  Patient presents with  . Hypertension   Subjective:    HPI    F/u Htn med check up.  Concerns about an itchy rash around eyes that came up 5 days ago.   Going to [facility] for [facility].  Diagnosed as "soft" bipolar disorder.  Kept on wellbutrin and took off lexapro.  Put on lamictal and duloxetine. Doing better and mood swings still there.  H/o psoriatric arthritis, and seeing rheum this week.  faigue is lot of this. Has gained some weight and feels it's contributing.  Left eye  Was swollen and redness under eye.  Itchy on eyelid on upper lids. Used benadryl cream Eyes itchy a lot due to dry eyes. Was near cat last weekend.  Pt had mild stroke 2 yrs ago. Was put on losartan and lipitor. Also on bumex and spironolactone.  Cr- 1.29, stable since 8/20. Since cirrhosis had some kidney insufficiency.  Ranging 1.2-1.4.  Has ckd stage 3.    Deanna Thompson has a past medical history of Abdominal hernia, Abscess of pulp of tooth (12/12/14), Anxiety, Arthritis, B12 deficiency (06/12/2015), Brain aneurysm, Cirrhosis ([place]), Complication of anesthesia, Depression, ETOH abuse, Fatigue, Folate deficiency (06/30/2016), GERD (gastroesophageal reflux disease), HTN (hypertension), IBS (irritable bowel syndrome), Iron deficiency anemia due to chronic blood loss, Myofascial pain, Panic attack, PONV (postoperative nausea and vomiting), Psoriatic arthritis ([place]), Right inguinal hernia (08/16/2012), Seizures ([place]) (1997), Sjogren's disease ([place]), 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   Outpatient Encounter Medications as of 11/30/2019  Medication Sig  . ALPRAZolam (XANAX) 0.5 MG tablet Take 1/2 tablet po qhs prn sleep  . aspirin EC 81 MG tablet Take 1 tablet (81 mg total) by mouth  daily with breakfast.  . atorvastatin (LIPITOR) 40 MG tablet TAKE (1) TABLET BY MOUTH ONCE DAILY.  . bumetanide (BUMEX) 1 MG tablet TAKE (1) TABLET BY MOUTH TWICE DAILY.  Marland Kitchen buPROPion (WELLBUTRIN SR) 100 MG 12 hr tablet TAKE (1) TABLET BY MOUTH TWICE DAILY. (Patient taking differently: Take 100 mg by mouth 2 (two) times daily. )  . Camphor-Menthol-Methyl Sal (SALONPAS) 3.04-04-08 % PTCH Place 1 patch onto the skin daily as needed (pain.).   Marland Kitchen cholecalciferol (VITAMIN D3) 25 MCG (1000 UT) tablet Take 2 tablets (2,000 Units total) by mouth daily. (Patient taking differently: Take 1,000 Units by mouth every morning. )  . DULoxetine (CYMBALTA) 30 MG capsule Take 40 mg by mouth daily.   . famotidine (PEPCID) 20 MG tablet Take 1 tablet (20 mg total) by mouth 2 (two) times daily before a meal.  . fluticasone (FLONASE) 50 MCG/ACT nasal spray Place 1 spray into both nostrils See admin instructions. Instill 1 spray into each nostril every morning, may also use later in the day as needed for sinus congestion  . lamoTRIgine (LAMICTAL) 150 MG tablet Take 150 mg by mouth daily.  Marland Kitchen Lifitegrast (XIIDRA) 5 % SOLN Place 1 drop into both eyes 2 (two) times daily.  Marland Kitchen LINZESS 145 MCG CAPS capsule TAKE (1) CAPSULE BY MOUTH EVERY DAY BEFORE BREAKFAST.  Marland Kitchen loratadine (CLARITIN) 10 MG tablet Take 10 mg by mouth daily.  Marland Kitchen losartan (COZAAR) 25 MG tablet Take 1 tablet (25 mg total) by mouth daily.  . Multiple Vitamin (MULTIVITAMIN WITH MINERALS) TABS tablet Take 1 tablet by mouth daily.  . Polyvinyl Alcohol-Povidone (REFRESH OP)  Place 1 drop into both eyes 3 (three) times daily as needed (dry eyes).  . Simethicone (GAS-X PO) Take 1 tablet by mouth 2 (two) times daily as needed (bloating/flatulence).  . sodium chloride (OCEAN) 0.65 % nasal spray Place 1 spray into the nose 2 (two) times daily as needed for congestion.   Marland Kitchen spironolactone (ALDACTONE) 100 MG tablet Take 0.5 tablets (50 mg total) by mouth 2 (two) times daily.  .  Tofacitinib Citrate ER (XELJANZ XR) 11 MG TB24 Take 11 mg by mouth daily.   . [DISCONTINUED] atorvastatin (LIPITOR) 40 MG tablet TAKE (1) TABLET BY MOUTH ONCE DAILY.  . [DISCONTINUED] DULoxetine (CYMBALTA) 20 MG capsule Take 20 mg by mouth 2 (two) times daily.  . [DISCONTINUED] losartan (COZAAR) 25 MG tablet Take 1 tablet (25 mg total) by mouth daily.  . [DISCONTINUED] spironolactone (ALDACTONE) 100 MG tablet Take 0.5 tablets (50 mg total) by mouth 2 (two) times daily. (Patient taking differently: Take 50 mg by mouth daily. )  . hydrocortisone 2.5 % ointment Apply topically 2 (two) times daily. Topically to eyelid rash for 1 week.  . [DISCONTINUED] escitalopram (LEXAPRO) 20 MG tablet TAKE (1) TABLET BY MOUTH AT BEDTIME. (Patient not taking: Reported on 08/24/2019)   No facility-administered encounter medications on file as of 11/30/2019.     Review of Systems  Constitutional: Negative for chills and fever.  HENT: Negative for congestion, rhinorrhea and sore throat.   Eyes: Positive for itching.       +bilateral upper eyelid rash  Respiratory: Negative for cough, shortness of breath and wheezing.   Cardiovascular: Negative for chest pain and leg swelling.  Gastrointestinal: Negative for abdominal pain, diarrhea, nausea and vomiting.  Genitourinary: Negative for dysuria and frequency.  Musculoskeletal: Negative for arthralgias and back pain.  Skin: Negative for rash.  Neurological: Negative for dizziness, weakness and headaches.     Vitals BP 126/74   Pulse 86   Temp (!) 97.4 F (36.3 C)   Ht 5\' 8"  (1.727 m)   Wt 252 lb (114.3 kg)   SpO2 98%   BMI 38.32 kg/m   Objective:   Physical Exam Vitals and nursing note reviewed.  Constitutional:      Appearance: Normal appearance.  HENT:     Head: Normocephalic and atraumatic.     Nose: Nose normal.     Mouth/Throat:     Mouth: Mucous membranes are moist.     Pharynx: Oropharynx is clear.  Eyes:     Extraocular Movements:  Extraocular movements intact.     Conjunctiva/sclera: Conjunctivae normal.     Pupils: Pupils are equal, round, and reactive to light.     Comments: +bilateral upper eyelids with driness and scaliness, mild erythema. Also mild erythema under left lower lid.  Cardiovascular:     Rate and Rhythm: Normal rate and regular rhythm.     Pulses: Normal pulses.     Heart sounds: Normal heart sounds.  Pulmonary:     Effort: Pulmonary effort is normal.     Breath sounds: Normal breath sounds. No wheezing, rhonchi or rales.  Musculoskeletal:        General: Normal range of motion.     Right lower leg: No edema.     Left lower leg: No edema.  Skin:    General: Skin is warm and dry.     Findings: No lesion or rash.  Neurological:     General: No focal deficit present.     Mental Status: Deanna Thompson  is alert and oriented to person, place, and time.  Psychiatric:        Mood and Affect: Mood normal.        Behavior: Behavior normal.      Assessment and Plan   1. Essential hypertension - spironolactone (ALDACTONE) 100 MG tablet; Take 0.5 tablets (50 mg total) by mouth 2 (two) times daily.  Dispense: 90 tablet; Refill: 1 - losartan (COZAAR) 25 MG tablet; Take 1 tablet (25 mg total) by mouth daily.  Dispense: 90 tablet; Refill: 1  2. Stage 3a chronic kidney disease  3. Psoriatic arthritis ([place])  4. Iron deficiency anemia due to chronic blood loss  5. Alcoholic cirrhosis of liver without ascites (H[facility])  6. Seborrheic dermatitis - hydrocortisone 2.5 % ointment; Apply topically 2 (two) times daily. Topically to eyelid rash for 1 week.  Dispense: 30 g; Refill: 1  7. History of cardioembolic cerebrovascular accident (CVA) - atorvastatin (LIPITOR) 40 MG tablet; TAKE (1) TABLET BY MOUTH ONCE DAILY.  Dispense: 90 tablet; Refill: 1   Htn- stable, cont meds.  ckd stage 3- Cr at 1.29. stable. Cont to monitor.  seb derm- on bilateral upper eyelids.  Trial of HC cream sparingly for 3-5 days then  discontinue.  Take zyrtec daily for allergies.  H/o liver cirrhosis- Getting blood work for anemia- getting iron infusions. Getting labs for this q77mo. Has low ferritin usually. Due to her liver cirrhosis.  Seeing [facility]- taking care of her medications.  Psoriatic arthritis- seeing rheumatologist.  Cont meds and f/u.  H/o cva- cont control bp and cont with lipitor.  F/u 21mo or prn.

## 2019-12-05 ENCOUNTER — Other Ambulatory Visit: Payer: Self-pay | Admitting: Family Medicine

## 2019-12-05 DIAGNOSIS — I10 Essential (primary) hypertension: Secondary | ICD-10-CM

## 2019-12-26 ENCOUNTER — Ambulatory Visit (INDEPENDENT_AMBULATORY_CARE_PROVIDER_SITE_OTHER): Payer: Commercial Managed Care - PPO | Admitting: Nurse Practitioner

## 2019-12-26 ENCOUNTER — Other Ambulatory Visit: Payer: Self-pay

## 2019-12-26 ENCOUNTER — Encounter: Payer: Self-pay | Admitting: Nurse Practitioner

## 2019-12-26 VITALS — BP 135/81 | HR 84 | Temp 97.2°F | Ht 68.0 in | Wt 257.4 lb

## 2019-12-26 DIAGNOSIS — K581 Irritable bowel syndrome with constipation: Secondary | ICD-10-CM

## 2019-12-26 DIAGNOSIS — K219 Gastro-esophageal reflux disease without esophagitis: Secondary | ICD-10-CM

## 2019-12-26 DIAGNOSIS — K7030 Alcoholic cirrhosis of liver without ascites: Secondary | ICD-10-CM | POA: Diagnosis not present

## 2019-12-26 NOTE — Patient Instructions (Signed)
Your health issues we discussed today were:   GERD (heartburn/reflux): 1. Continue taking Pepcid 2. Let us know if you have any worsening or severe symptoms  Irritable bowel syndrome constipation type: 1. Continue taking Linzess  2. Let us know if there are any worsening or severe symptoms  Cirrhosis: 1. As we discussed, you are up-to-date on your imaging and upper endoscopy 2. We will likely need to repeat an EGD 3 years after your previous 1 (around 2023) 3. I am adding on labs to your already scheduled labs with hematology that she can have drawn at the same time this week 4. We will plan to order your next liver ultrasound at your follow-up visit 5. Call us for any worsening or concerning symptoms  Overall I recommend:  1. Continue your other current medications 2. Return for follow-up in 6 months 3. Call us for any questions or concerns   ---------------------------------------------------------------  I am glad you have gotten your COVID-19 vaccination!  Even though you are fully vaccinated you should continue to follow CDC and state/local guidelines.  ---------------------------------------------------------------   At [facility] we value your feedback. You may receive a survey about your visit today. Please share your experience as we strive to create trusting relationships with our patients to provide genuine, compassionate, quality care.  We appreciate your understanding and patience as we review any laboratory studies, imaging, and other diagnostic tests that are ordered as we care for you. Our office policy is 5 business days for review of these results, and any emergent or urgent results are addressed in a timely manner for your best interest. If you do not hear from our office in 1 week, please contact us.   We also encourage the use of MyChart, which contains your medical information for your review as well. If you are not enrolled in this feature, an  access code is on this after visit summary for your convenience. Thank you for allowing [place] to be involved in your care.  It was great to see you today!  I hope you have a great Fall!!

## 2019-12-26 NOTE — Progress Notes (Signed)
Referring Provider: Mikey Kirschner, MD Primary Care Physician:  Erven Colla, DO Primary GI:  Dr. Abbey Chatters  Chief Complaint  Patient presents with  . Cirrhosis    f/u. Doing well    HPI:    is a 64 y.o. female who presents for follow-up on cirrhosis.  Patient was last seen in our office 1/69/6789 for alcoholic cirrhosis and IBS constipation type.  At that time noted IBS not ideally controlled on Linzess and stool softeners.  Recommended magnesium citrate tablets 1 on day 1 and if no bowel movement in 2 pills on day 2 and hold day 3.  Repeat to have a satisfactory bowel movement.  Noted due for ultrasound in June 2021.  Overall felt to be well compensated disease.  Follow-up in 6 months.  Right upper quadrant ultrasound completed 09/18/2019 which found no cirrhosis, no liver masses, otherwise normal.  Most recent CMP completed 09/12/2019 which found normal LFTs.  Creatinine stable at 1.29.  Most recent PT/INR dated 03/14/2019 which was normal at 1.2. MELD at that time: 10; Child-Pugh: A.  Most recent platelets normal at 329. CT of the abdomen and pelvis completed 03/16/2019 noted normal spleen size and no mention of gastric or esophageal varices.  EGD up-to-date 03/16/2019 which was technically completed for melena and acute posthemorrhagic anemia.  Esophagus was normal.  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  She will next be due for screening EGD in 2023.  Today she states she is doing okay overall. She has an appointment in 2 days to have blood drawn at [facility]. GERD and IBS well managed on current regimen. Denies abdominal pain, N/V, hematochezia, melena, fever, chills, unintentional weight loss. Denies any yellowing of the skin/eyes, darkened urine, acute episodic confusion, generalized tremors, generalized pruritis. Denies URI or flu-like symptoms. Denies loss of sense of taste or smell. The patient has  received COVID-19 vaccination(s). Denies chest pain, dyspnea, dizziness, lightheadedness, syncope, near syncope. Denies any other upper or lower GI symptoms.  Past Medical History:  Diagnosis Date  . Abdominal hernia    repaired  . Abscess of pulp of tooth 12/12/14   left side  . Anxiety   . Arthritis    back  and down flank  right side more than left  . B12 deficiency 06/12/2015  . Brain aneurysm    METAL COIL PREVENTS MRI IMAGING and CLIP  . Cirrhosis ([place])    Presumably alcoholic cirrhosis  . Complication of anesthesia   . Depression   . ETOH abuse    Quit in 07/2011   . Fatigue    due to Sjogren's disease  . Folate deficiency 06/30/2016  . GERD (gastroesophageal reflux disease)   . HTN (hypertension)    no longer medicated, dated on 04/25/12  . IBS (irritable bowel syndrome)   . Iron deficiency anemia due to chronic blood loss   . Myofascial pain   . Panic attack   . PONV (postoperative nausea and vomiting)   . Psoriatic arthritis ([place])   . Right inguinal hernia 08/16/2012  . Seizures ([place]) 1997   1 seizure from coil insertion but no more seizures and no meds for seizures  . Sjogren's disease ([place])   . [place] HEAD 02/03/2007   Qualifier: Diagnosis of  By: Aline Brochure MD, Dorothyann Peng    . TIA (transient ischemic attack)   . 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([place])   . Vertigo  Past Surgical History:  Procedure Laterality Date  . AGILE CAPSULE N/A 06/21/2014   Procedure: AGILE CAPSULE;  Surgeon: Danie Binder, MD;  [facility];  Service: Endoscopy;  Laterality: N/A;  0700  . BIOPSY  10/13/2011   SLF: mild gastritis/duodenal polypoid lesion   . CEREBRAL ANEURYSM REPAIR  1997  . CHOLECYSTECTOMY N/A 10/28/2018   Procedure: LAPAROSCOPIC CHOLECYSTECTOMY WITH INTRAOPERATIVE CHOLANGIOGRAM;  Surgeon: Virl Cagey, MD;  Location: [facility];  Service: General;  Laterality: N/A;  . COLONOSCOPY  10/13/2011   QMG:[id number] hemorrhoids/varices rectal/lesion  in ascending colon(HYPERPLASTIC POLYP)  . COLONOSCOPY N/A 01/17/2013   SLF: 1. Internal hemorrhoids 2. Ascending colon polyps 3.  Rectal varices.   . COLONOSCOPY N/A 08/10/2016   Dr. Oneida Alar: redundant left colon. external and internal hemorhroids. 5-10 year surveillance  . ESOPHAGOGASTRODUODENOSCOPY N/A 06/29/2014   SLF: 1. anemia due to polypoid lesions in teh antrum, and duodenal polyps 2. single duodenum AVM  . ESOPHAGOGASTRODUODENOSCOPY (EGD) WITH PROPOFOL N/A 03/07/2019   Procedure: ESOPHAGOGASTRODUODENOSCOPY (EGD) WITH PROPOFOL;  Surgeon: Danie Binder, MD;  [facility];  Service: Endoscopy;  Laterality: N/A;  1:00pm  . ESOPHAGOGASTRODUODENOSCOPY (EGD) WITH PROPOFOL N/A 03/16/2019   Procedure: ESOPHAGOGASTRODUODENOSCOPY (EGD) WITH PROPOFOL;  Surgeon: Danie Binder, MD;  [facility];  Service: Endoscopy;  Laterality: N/A;  . FLEXIBLE SIGMOIDOSCOPY N/A 01/21/2016   Procedure: FLEXIBLE SIGMOIDOSCOPY;  Surgeon: Danie Binder, MD;  [facility];  Service: Endoscopy;  Laterality: N/A;  245  . GIVENS CAPSULE STUDY N/A 06/29/2014   Procedure: GIVENS CAPSULE STUDY;  Surgeon: Danie Binder, MD;  [facility];  Service: Endoscopy;  Laterality: N/A;  . GIVENS CAPSULE STUDY N/A 07/13/2014   Procedure: GIVENS CAPSULE STUDY;  Surgeon: Danie Binder, MD;  [facility];  Service: Endoscopy;  Laterality: N/A;  700  . HEMORRHOID BANDING N/A 01/21/2016   Procedure: HEMORRHOID BANDING;  Surgeon: Danie Binder, MD;  [facility];  Service: Endoscopy;  Laterality: N/A;  . HEMOSTASIS CLIP PLACEMENT  03/16/2019   Procedure: HEMOSTASIS CLIP PLACEMENT;  Surgeon: Danie Binder, MD;  [facility];  Service: Endoscopy;;  x 4  . HEMOSTASIS CONTROL  03/16/2019   Procedure: HEMOSTASIS CONTROL;  Surgeon: Danie Binder, MD;  [facility];  Service: Endoscopy;;  injection with epinephine 0.1mg /ml via scope and bipolar cautery with  gold probe  . INGUINAL HERNIA REPAIR Right 08/28/2013   Procedure: RIGHT NGUINAL HERNIORRHAPHY WITH MESH;  Surgeon: Jamesetta So, MD;  Location: [facility];  Service: General;  Laterality: Right;  . INSERTION OF MESH N/A 06/09/2013   Procedure: INSERTION OF MESH;  Surgeon: Jamesetta So, MD;  Location: [facility];  Service: General;  Laterality: N/A;  . INSERTION OF MESH Right 08/28/2013   Procedure: INSERTION OF MESH;  Surgeon: Jamesetta So, MD;  Location: [facility];  Service: General;  Laterality: Right;  . PARACENTESIS  Feb 2014   10.7 liters  . TOOTH EXTRACTION Left 12/12/14  . UMBILICAL HERNIA REPAIR N/A 06/09/2013   Procedure: UMBILICAL HERNIORRHAPHY WITH MESH;  Surgeon: Jamesetta So, MD;  Location: [facility];  Service: General;  Laterality: N/A;    Current Outpatient Medications  Medication Sig Dispense Refill  . aspirin EC 81 MG tablet Take 1 tablet (81 mg total) by mouth daily with breakfast. 30 tablet 2  . atorvastatin (LIPITOR) 40 MG tablet TAKE (1) TABLET BY MOUTH ONCE DAILY.  90 tablet 1  . bumetanide (BUMEX) 1 MG tablet TAKE (1) TABLET BY MOUTH TWICE DAILY. 180 tablet 1  . buPROPion (WELLBUTRIN SR) 100 MG 12 hr tablet TAKE (1) TABLET BY MOUTH TWICE DAILY. (Patient taking differently: Take 100 mg by mouth 2 (two) times daily. ) 60 tablet 5  . Camphor-Menthol-Methyl Sal (SALONPAS) 3.04-04-08 % PTCH Place 1 patch onto the skin daily as needed (pain.).     Marland Kitchen cholecalciferol (VITAMIN D3) 25 MCG (1000 UT) tablet Take 2 tablets (2,000 Units total) by mouth daily. (Patient taking differently: Take 1,000 Units by mouth every morning. ) 30 tablet 11  . DULoxetine (CYMBALTA) 30 MG capsule Take 40 mg by mouth daily.     . famotidine (PEPCID) 20 MG tablet Take 1 tablet (20 mg total) by mouth 2 (two) times daily before a meal. 60 tablet 5  . fluticasone (FLONASE) 50 MCG/ACT nasal spray Place 1 spray into both nostrils See admin instructions. Instill 1 spray into each nostril every morning, may also use later  in the day as needed for sinus congestion    . hydrocortisone 2.5 % ointment Apply topically 2 (two) times daily. Topically to eyelid rash for 1 week. 30 g 1  . lamoTRIgine (LAMICTAL) 150 MG tablet Take 200 mg by mouth daily.     Marland Kitchen Lifitegrast (XIIDRA) 5 % SOLN Place 1 drop into both eyes 2 (two) times daily.    Marland Kitchen LINZESS 145 MCG CAPS capsule TAKE (1) CAPSULE BY MOUTH EVERY DAY BEFORE BREAKFAST. 90 capsule 3  . loratadine (CLARITIN) 10 MG tablet Take 10 mg by mouth daily.    Marland Kitchen losartan (COZAAR) 25 MG tablet Take 1 tablet (25 mg total) by mouth daily. 90 tablet 1  . Polyvinyl Alcohol-Povidone (REFRESH OP) Place 1 drop into both eyes 3 (three) times daily as needed (dry eyes).    . Simethicone (GAS-X PO) Take 1 tablet by mouth 2 (two) times daily as needed (bloating/flatulence).    . sodium chloride (OCEAN) 0.65 % nasal spray Place 1 spray into the nose 2 (two) times daily as needed for congestion.     Marland Kitchen spironolactone (ALDACTONE) 100 MG tablet Take 0.5 tablets (50 mg total) by mouth 2 (two) times daily. 90 tablet 1  . Tofacitinib Citrate ER (XELJANZ XR) 11 MG TB24 Take 11 mg by mouth daily.      No current facility-administered medications for this visit.    Allergies as of 12/26/2019 - Review Complete 12/26/2019  Allergen Reaction Noted  . Omeprazole Shortness Of Breath 08/19/2011  . Orencia [abatacept] Anaphylaxis, Hives, and Rash 10/25/2017  . Other Shortness Of Breath and Rash 01/09/2013  . Phenytoin Rash   . Keflex [cephalexin] Other (See Comments)   . Lidocaine viscous hcl  03/16/2019  . Phenergan [promethazine hcl] Other (See Comments) 04/08/2012    Family History  Problem Relation Age of Onset  . Cirrhosis Mother        nash  . COPD Father   . Heart disease Father   . Asthma Other   . Heart disease Other   . Colon cancer Neg Hx   . Inflammatory bowel disease Neg Hx     Social History   Socioeconomic History  . Marital status: Married    Spouse name: Kris Mouton  .  Number of children: 0  . Years of education: Not on file  . Highest education level: Not on file  Occupational History    Employer: RETIRED  Tobacco Use  .  Smoking status: Former Smoker    Packs/day: 2.00    Years: 12.00    Pack years: 24.00    Types: Cigarettes    Quit date: 03/31/1995    Years since quitting: 24.7  . Smokeless tobacco: Never Used  . Tobacco comment: quit in 1997  Vaping Use  . Vaping Use: Never used  Substance and Sexual Activity  . Alcohol use: No    Comment: Bottle of wine a day for years, but quit in 07/2011  . Drug use: No  . Sexual activity: Not Currently    Birth control/protection: Post-menopausal  Other Topics Concern  . Not on file  Social History Narrative   Lives   Caffeine use:    Quit drinking alcohol 3 YEARS ago. No drugs, no IVDU.    Social Determinants of Health   Financial Resource Strain:   . Difficulty of Paying Living Expenses: Not on file  Food Insecurity:   . Worried About Charity fundraiser in the Last Year: Not on file  . Ran Out of Food in the Last Year: Not on file  Transportation Needs:   . Lack of Transportation (Medical): Not on file  . Lack of Transportation (Non-Medical): Not on file  Physical Activity:   . Days of Exercise per Week: Not on file  . Minutes of Exercise per Session: Not on file  Stress:   . Feeling of Stress : Not on file  Social Connections:   . Frequency of Communication with Friends and Family: Not on file  . Frequency of Social Gatherings with Friends and Family: Not on file  . Attends Religious Services: Not on file  . Active Member of Clubs or Organizations: Not on file  . Attends Archivist Meetings: Not on file  . Marital Status: Not on file    Subjective: Review of Systems  Constitutional: Negative for chills, fever, malaise/fatigue and weight loss.  HENT: Negative for congestion and sore throat.   Respiratory: Negative for cough and shortness of breath.   Cardiovascular:  Negative for chest pain and palpitations.  Gastrointestinal: Negative for abdominal pain, blood in stool, diarrhea, melena, nausea and vomiting.  Musculoskeletal: Negative for joint pain and myalgias.  Skin: Negative for rash.  Neurological: Negative for dizziness and weakness.  Endo/Heme/Allergies: Does not bruise/bleed easily.  Psychiatric/Behavioral: Negative for depression. The patient is not nervous/anxious.   All other systems reviewed and are negative.    Objective: BP 135/81   Pulse 84   Temp (!) 97.2 F (36.2 C) (Oral)   Ht 5\' 8"  (1.727 m)   Wt 257 lb 6.4 oz (116.8 kg)   BMI 39.14 kg/m  Physical Exam Vitals and nursing note reviewed.  Constitutional:      General: She is not in acute distress.    Appearance: Normal appearance. She is well-developed. She is not ill-appearing, toxic-appearing or diaphoretic.  HENT:     Head: Normocephalic and atraumatic.     Nose: No congestion or rhinorrhea.  Eyes:     General: No scleral icterus. Cardiovascular:     Rate and Rhythm: Normal rate and regular rhythm.     Heart sounds: Normal heart sounds.  Pulmonary:     Effort: Pulmonary effort is normal. No respiratory distress.     Breath sounds: Normal breath sounds.  Abdominal:     General: Bowel sounds are normal.     Palpations: Abdomen is soft. There is no hepatomegaly, splenomegaly or mass.     Tenderness:  There is no abdominal tenderness. There is no guarding or rebound.     Hernia: No hernia is present.  Skin:    General: Skin is warm and dry.     Coloration: Skin is not jaundiced.     Findings: No rash.  Neurological:     General: No focal deficit present.     Mental Status: She is alert and oriented to person, place, and time.  Psychiatric:        Attention and Perception: Attention normal.        Mood and Affect: Mood normal.        Speech: Speech normal.        Behavior: Behavior normal.        Thought Content: Thought content normal.        Cognition and  Memory: Cognition and memory normal.      Assessment:  Very pleasant 64 year old female with history of GERD, IBS, cirrhosis.  Overall she is doing quite well.  She is up-to-date on EGD and abdominal imaging as per above for some labs.  GERD: Currently well managed on H2 receptor blocker (famotidine).  IBS constipation type: Currently well managed on Linzess 145 mcg daily.  Cirrhosis: Most recent meld score 10, child Pugh class A.  Likely alcoholic cirrhosis in etiology.  Has done quite well over the past couple years.  She is no longer drinking, and has not for some time now.  Generally well compensated disease.  Right upper quadrant ultrasound updated in June and no suspicious lesions.  Is currently due for labs and has ordered labs for this week with oncology.  We will add on AFP and INR to complete her lab set for cirrhosis stool out to calculate meld score.   Plan: 1. AFP and INR to be added onto hematology labs (including CBC, CMP) 2. Continue current medications 3. Follow-up in 6 months for routine liver care    Thank you for allowing [place] to participate in the care of Barlow, Wheeler, AGNP-C Adult & Gerontological Nurse Practitioner [facility]   12/26/2019 2:22 PM   Disclaimer: This note was dictated with voice recognition software. Similar sounding words can inadvertently be transcribed and may not be corrected upon review.

## 2019-12-27 ENCOUNTER — Encounter (HOSPITAL_COMMUNITY): Payer: Self-pay | Admitting: Nurse Practitioner

## 2019-12-29 ENCOUNTER — Other Ambulatory Visit: Payer: Self-pay

## 2019-12-29 ENCOUNTER — Other Ambulatory Visit (HOSPITAL_COMMUNITY)
Admission: RE | Admit: 2019-12-29 | Discharge: 2019-12-29 | Disposition: A | Discharge status: Home / Self Care | Payer: Commercial Managed Care - PPO | Source: Ambulatory Visit | Attending: Nurse Practitioner | Admitting: Nurse Practitioner

## 2019-12-29 ENCOUNTER — Inpatient Hospital Stay (HOSPITAL_COMMUNITY): Payer: Medicare HMO | Attending: Hematology

## 2019-12-29 DIAGNOSIS — N189 Chronic kidney disease, unspecified: Secondary | ICD-10-CM | POA: Diagnosis not present

## 2019-12-29 DIAGNOSIS — K219 Gastro-esophageal reflux disease without esophagitis: Secondary | ICD-10-CM | POA: Insufficient documentation

## 2019-12-29 DIAGNOSIS — K7030 Alcoholic cirrhosis of liver without ascites: Secondary | ICD-10-CM | POA: Insufficient documentation

## 2019-12-29 DIAGNOSIS — K5521 Angiodysplasia of colon with hemorrhage: Secondary | ICD-10-CM | POA: Insufficient documentation

## 2019-12-29 DIAGNOSIS — Z8616 Personal history of COVID-19: Secondary | ICD-10-CM | POA: Diagnosis not present

## 2019-12-29 DIAGNOSIS — K922 Gastrointestinal hemorrhage, unspecified: Secondary | ICD-10-CM | POA: Insufficient documentation

## 2019-12-29 DIAGNOSIS — D500 Iron deficiency anemia secondary to blood loss (chronic): Secondary | ICD-10-CM | POA: Diagnosis not present

## 2019-12-29 DIAGNOSIS — Z87891 Personal history of nicotine dependence: Secondary | ICD-10-CM | POA: Diagnosis not present

## 2019-12-29 LAB — FERRITIN: Ferritin: 11 ng/mL (ref 11–307)

## 2019-12-29 LAB — IRON AND TIBC
Iron: 68 ug/dL (ref 28–170)
Saturation Ratios: 14 % (ref 10.4–31.8)
TIBC: 488 ug/dL — ABNORMAL HIGH (ref 250–450)
UIBC: 420 ug/dL

## 2019-12-29 LAB — CBC WITH DIFFERENTIAL/PLATELET
Abs Immature Granulocytes: 0.03 10*3/uL (ref 0.00–0.07)
Basophils Absolute: 0 10*3/uL (ref 0.0–0.1)
Basophils Relative: 0 %
Eosinophils Absolute: 0.2 10*3/uL (ref 0.0–0.5)
Eosinophils Relative: 3 %
HCT: 41.3 % (ref 36.0–46.0)
Hemoglobin: 13.5 g/dL (ref 12.0–15.0)
Immature Granulocytes: 1 %
Lymphocytes Relative: 8 %
Lymphs Abs: 0.5 10*3/uL — ABNORMAL LOW (ref 0.7–4.0)
MCH: 30.3 pg (ref 26.0–34.0)
MCHC: 32.7 g/dL (ref 30.0–36.0)
MCV: 92.8 fL (ref 80.0–100.0)
Monocytes Absolute: 0.6 10*3/uL (ref 0.1–1.0)
Monocytes Relative: 10 %
Neutro Abs: 5.1 10*3/uL (ref 1.7–7.7)
Neutrophils Relative %: 78 %
Platelets: 262 10*3/uL (ref 150–400)
RBC: 4.45 MIL/uL (ref 3.87–5.11)
RDW: 16.1 % — ABNORMAL HIGH (ref 11.5–15.5)
WBC: 6.5 10*3/uL (ref 4.0–10.5)
nRBC: 0 % (ref 0.0–0.2)

## 2019-12-29 LAB — COMPREHENSIVE METABOLIC PANEL
ALT: 25 U/L (ref 0–44)
AST: 26 U/L (ref 15–41)
Albumin: 4.3 g/dL (ref 3.5–5.0)
Alkaline Phosphatase: 85 U/L (ref 38–126)
Anion gap: 10 (ref 5–15)
BUN: 19 mg/dL (ref 8–23)
CO2: 24 mmol/L (ref 22–32)
Calcium: 9.7 mg/dL (ref 8.9–10.3)
Chloride: 104 mmol/L (ref 98–111)
Creatinine, Ser: 1.21 mg/dL — ABNORMAL HIGH (ref 0.44–1.00)
GFR calc Af Amer: 56 mL/min — ABNORMAL LOW (ref >60)
GFR calc non Af Amer: 48 mL/min — ABNORMAL LOW (ref >60)
Glucose, Bld: 139 mg/dL — ABNORMAL HIGH (ref 70–99)
Potassium: 3.9 mmol/L (ref 3.5–5.1)
Sodium: 138 mmol/L (ref 135–145)
Total Bilirubin: 0.7 mg/dL (ref 0.3–1.2)
Total Protein: 7.8 g/dL (ref 6.5–8.1)

## 2019-12-29 LAB — VITAMIN D 25 HYDROXY (VIT D DEFICIENCY, FRACTURES): Vit D, 25-Hydroxy: 33.64 ng/mL (ref 30–100)

## 2019-12-29 LAB — LACTATE DEHYDROGENASE: LDH: 167 U/L (ref 98–192)

## 2019-12-29 LAB — VITAMIN B12: Vitamin B-12: 344 pg/mL (ref 180–914)

## 2019-12-30 LAB — AFP TUMOR MARKER: AFP, Serum, Tumor Marker: 4 ng/mL (ref 0.0–8.3)

## 2020-01-05 ENCOUNTER — Ambulatory Visit (HOSPITAL_COMMUNITY): Payer: Commercial Managed Care - PPO | Admitting: Oncology

## 2020-01-22 ENCOUNTER — Other Ambulatory Visit: Payer: Self-pay | Admitting: Nurse Practitioner

## 2020-02-08 ENCOUNTER — Other Ambulatory Visit: Payer: Self-pay

## 2020-02-08 ENCOUNTER — Encounter: Payer: Self-pay | Admitting: Nurse Practitioner

## 2020-02-08 ENCOUNTER — Ambulatory Visit (INDEPENDENT_AMBULATORY_CARE_PROVIDER_SITE_OTHER): Payer: Medicare HMO | Admitting: Nurse Practitioner

## 2020-02-08 ENCOUNTER — Telehealth: Payer: Self-pay

## 2020-02-08 VITALS — BP 135/79 | HR 84 | Temp 97.1°F | Ht 67.0 in | Wt 259.0 lb

## 2020-02-08 DIAGNOSIS — R1319 Other dysphagia: Secondary | ICD-10-CM

## 2020-02-08 DIAGNOSIS — K7030 Alcoholic cirrhosis of liver without ascites: Secondary | ICD-10-CM

## 2020-02-08 DIAGNOSIS — K219 Gastro-esophageal reflux disease without esophagitis: Secondary | ICD-10-CM | POA: Diagnosis not present

## 2020-02-08 DIAGNOSIS — R1310 Dysphagia, unspecified: Secondary | ICD-10-CM | POA: Insufficient documentation

## 2020-02-08 NOTE — Progress Notes (Signed)
CC'ED TO PCP 

## 2020-02-08 NOTE — Progress Notes (Signed)
Referring Provider: Erven Colla, DO Primary Care Physician:  Erven Colla, DO Primary GI:  Dr. Abbey Chatters  Chief Complaint  Patient presents with  . Dysphagia    HPI:   Deanna Thompson is a 64 y.o. female who presents for follow-up on dysphagia.  The patient was last seen in our office 12/26/2019 for GERD, IBS constipation type, alcoholic cirrhosis without ascites.  Previously IBS not ideally controlled despite Linzess and stool softeners and recommended magnesium citrate tablets 1 on day 1 and if no bowel movement then 2 pills on day 2 and hold on day 3.  History of cirrhosis generally well compensated.    Right upper quadrant ultrasound up-to-date 09/18/2019 with noted cirrhosis, no liver masses, otherwise normal.    Most recent lab evaluations stable with meld score 10, child Pugh class A.  CT 03/16/2019 with normal spleen size no mention of gastric or esophageal varices.  EGD up-to-date 03/16/2019 completed for melena and acute posthemorrhagic anemia with normal esophagus noted and gastric AVMs as culprit for likely GI bleed status post bipolar cautery and MRI conditional clip placement.  Next due for screening in 2023.  At her last visit noted upcoming appointment in 2 days for blood work at [facility].  GERD and IBS well managed on current regimen.  No overt GI or hepatic complaints today.  Recommended addition of AFP and INR which were missing from previous labs, continue current medications, follow-up in 6 months for routine liver care.  AFP was completed 12/29/2019 normal at 4.0.  INR not completed.  Today she states she doing okay overall. She is having more frequent dysphagia symptoms. Has had dysphagia intermittently since aneurism surgery 20 years ago. Was having issues once every week or two which she was ok with. In the past few months she has had more frequent symptoms, as often as once a day. This is more problematic and is wanting to try and address this. Denies  ongoing abdominal pain, N/V, hematochezia, melena, fever, chills, unintentional weight loss. Labs up to date, U/S due in the next month. Denies URI or flu-like symptoms. Denies loss of sense of taste or smell. The patient has received COVID-19 vaccination(s). Denies chest pain, dyspnea, dizziness, lightheadedness, syncope, near syncope. Denies any other upper or lower GI symptoms.  Past Medical History:  Diagnosis Date  . Abdominal hernia    repaired  . Abscess of pulp of tooth 12/12/14   left side  . Anxiety   . Arthritis    back  and down flank  right side more than left  . B12 deficiency 06/12/2015  . Brain aneurysm    METAL COIL PREVENTS MRI IMAGING and CLIP  . Cirrhosis ([place])    Presumably alcoholic cirrhosis  . Complication of anesthesia   . Depression   . ETOH abuse    Quit in 07/2011   . Fatigue    due to Sjogren's disease  . Folate deficiency 06/30/2016  . GERD (gastroesophageal reflux disease)   . HTN (hypertension)    no longer medicated, dated on 04/25/12  . IBS (irritable bowel syndrome)   . Iron deficiency anemia due to chronic blood loss   . Myofascial pain   . Panic attack   . PONV (postoperative nausea and vomiting)   . Psoriatic arthritis ([place])   . Right inguinal hernia 08/16/2012  . Seizures ([place]) 1997   1 seizure from coil insertion but no more seizures and no meds for seizures  . Sjogren's  disease ([place])   . [place] HEAD 02/03/2007   Qualifier: Diagnosis of  By: Aline Brochure MD, Dorothyann Peng    . TIA (transient ischemic attack)   . 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([place])   . Vertigo     Past Surgical History:  Procedure Laterality Date  . AGILE CAPSULE N/A 06/21/2014   Procedure: AGILE CAPSULE;  Surgeon: Danie Binder, MD;  [facility];  Service: Endoscopy;  Laterality: N/A;  0700  . BIOPSY  10/13/2011   SLF: mild gastritis/duodenal polypoid lesion   . CEREBRAL ANEURYSM REPAIR  1997  . CHOLECYSTECTOMY N/A 10/28/2018   Procedure:  LAPAROSCOPIC CHOLECYSTECTOMY WITH INTRAOPERATIVE CHOLANGIOGRAM;  Surgeon: Virl Cagey, MD;  Location: [facility];  Service: General;  Laterality: N/A;  . COLONOSCOPY  10/13/2011   WUJ:[id number] hemorrhoids/varices rectal/lesion in ascending colon(HYPERPLASTIC POLYP)  . COLONOSCOPY N/A 01/17/2013   SLF: 1. Internal hemorrhoids 2. Ascending colon polyps 3.  Rectal varices.   . COLONOSCOPY N/A 08/10/2016   Dr. Oneida Alar: redundant left colon. external and internal hemorhroids. 5-10 year surveillance  . ESOPHAGOGASTRODUODENOSCOPY N/A 06/29/2014   SLF: 1. anemia due to polypoid lesions in teh antrum, and duodenal polyps 2. single duodenum AVM  . ESOPHAGOGASTRODUODENOSCOPY (EGD) WITH PROPOFOL N/A 03/07/2019   Procedure: ESOPHAGOGASTRODUODENOSCOPY (EGD) WITH PROPOFOL;  Surgeon: Danie Binder, MD;  [facility];  Service: Endoscopy;  Laterality: N/A;  1:00pm  . ESOPHAGOGASTRODUODENOSCOPY (EGD) WITH PROPOFOL N/A 03/16/2019   Procedure: ESOPHAGOGASTRODUODENOSCOPY (EGD) WITH PROPOFOL;  Surgeon: Danie Binder, MD;  [facility];  Service: Endoscopy;  Laterality: N/A;  . FLEXIBLE SIGMOIDOSCOPY N/A 01/21/2016   Procedure: FLEXIBLE SIGMOIDOSCOPY;  Surgeon: Danie Binder, MD;  [facility];  Service: Endoscopy;  Laterality: N/A;  245  . GIVENS CAPSULE STUDY N/A 06/29/2014   Procedure: GIVENS CAPSULE STUDY;  Surgeon: Danie Binder, MD;  [facility];  Service: Endoscopy;  Laterality: N/A;  . GIVENS CAPSULE STUDY N/A 07/13/2014   Procedure: GIVENS CAPSULE STUDY;  Surgeon: Danie Binder, MD;  [facility];  Service: Endoscopy;  Laterality: N/A;  700  . HEMORRHOID BANDING N/A 01/21/2016   Procedure: HEMORRHOID BANDING;  Surgeon: Danie Binder, MD;  [facility];  Service: Endoscopy;  Laterality: N/A;  . HEMOSTASIS CLIP PLACEMENT  03/16/2019   Procedure: HEMOSTASIS CLIP PLACEMENT;  Surgeon: Danie Binder, MD;  [facility];  Service:  Endoscopy;;  x 4  . HEMOSTASIS CONTROL  03/16/2019   Procedure: HEMOSTASIS CONTROL;  Surgeon: Danie Binder, MD;  [facility];  Service: Endoscopy;;  injection with epinephine 0.34m/ml via scope and bipolar cautery with gold probe  . INGUINAL HERNIA REPAIR Right 08/28/2013   Procedure: RIGHT NGUINAL HERNIORRHAPHY WITH MESH;  Surgeon: MJamesetta So MD;  Location: [facility];  Service: General;  Laterality: Right;  . INSERTION OF MESH N/A 06/09/2013   Procedure: INSERTION OF MESH;  Surgeon: MJamesetta So MD;  Location: [facility];  Service: General;  Laterality: N/A;  . INSERTION OF MESH Right 08/28/2013   Procedure: INSERTION OF MESH;  Surgeon: MJamesetta So MD;  Location: [facility];  Service: General;  Laterality: Right;  . PARACENTESIS  Feb 2014   10.7 liters  . TOOTH EXTRACTION Left 12/12/14  . UMBILICAL HERNIA REPAIR N/A 06/09/2013   Procedure: UMBILICAL HERNIORRHAPHY WITH MESH;  Surgeon: MJamesetta So MD;  Location: [facility];  Service: General;  Laterality: N/A;    Current  Outpatient Medications  Medication Sig Dispense Refill  . aspirin EC 81 MG tablet Take 1 tablet (81 mg total) by mouth daily with breakfast. 30 tablet 2  . atorvastatin (LIPITOR) 40 MG tablet TAKE (1) TABLET BY MOUTH ONCE DAILY. 90 tablet 1  . bumetanide (BUMEX) 1 MG tablet TAKE (1) TABLET BY MOUTH TWICE DAILY. 180 tablet 0  . buPROPion (WELLBUTRIN SR) 100 MG 12 hr tablet TAKE (1) TABLET BY MOUTH TWICE DAILY. (Patient taking differently: Take 100 mg by mouth 2 (two) times daily. ) 60 tablet 5  . Camphor-Menthol-Methyl Sal (SALONPAS) 3.04-04-08 % PTCH Place 1 patch onto the skin daily as needed (pain.).     Marland Kitchen cholecalciferol (VITAMIN D3) 25 MCG (1000 UT) tablet Take 2 tablets (2,000 Units total) by mouth daily. (Patient taking differently: Take 1,000 Units by mouth every morning. ) 30 tablet 11  . DULoxetine (CYMBALTA) 30 MG capsule Take 40 mg by mouth daily.     . famotidine (PEPCID) 20 MG tablet Take 1 tablet (20 mg  total) by mouth 2 (two) times daily before a meal. 60 tablet 5  . fluticasone (FLONASE) 50 MCG/ACT nasal spray Place 1 spray into both nostrils See admin instructions. Instill 1 spray into each nostril every morning, may also use later in the day as needed for sinus congestion    . hydrocortisone 2.5 % ointment Apply topically 2 (two) times daily. Topically to eyelid rash for 1 week. 30 g 1  . lamoTRIgine (LAMICTAL) 150 MG tablet Take 200 mg by mouth daily.     Marland Kitchen Lifitegrast (XIIDRA) 5 % SOLN Place 1 drop into both eyes 2 (two) times daily.    Marland Kitchen LINZESS 145 MCG CAPS capsule TAKE (1) CAPSULE BY MOUTH EVERY DAY BEFORE BREAKFAST. 90 capsule 3  . loratadine (CLARITIN) 10 MG tablet Take 10 mg by mouth daily.    Marland Kitchen losartan (COZAAR) 25 MG tablet Take 1 tablet (25 mg total) by mouth daily. 90 tablet 1  . Polyvinyl Alcohol-Povidone (REFRESH OP) Place 1 drop into both eyes 3 (three) times daily as needed (dry eyes).    . Simethicone (GAS-X PO) Take 1 tablet by mouth 2 (two) times daily as needed (bloating/flatulence).    . sodium chloride (OCEAN) 0.65 % nasal spray Place 1 spray into the nose 2 (two) times daily as needed for congestion.     Marland Kitchen spironolactone (ALDACTONE) 100 MG tablet Take 0.5 tablets (50 mg total) by mouth 2 (two) times daily. 90 tablet 1  . Tofacitinib Citrate ER (XELJANZ XR) 11 MG TB24 Take 11 mg by mouth daily.      No current facility-administered medications for this visit.    Allergies as of 02/08/2020 - Review Complete 02/08/2020  Allergen Reaction Noted  . Omeprazole Shortness Of Breath 08/19/2011  . Orencia [abatacept] Anaphylaxis, Hives, and Rash 10/25/2017  . Other Shortness Of Breath and Rash 01/09/2013  . Phenytoin Rash   . Keflex [cephalexin] Other (See Comments)   . Lidocaine viscous hcl  03/16/2019  . Phenergan [promethazine hcl] Other (See Comments) 04/08/2012    Family History  Problem Relation Age of Onset  . Cirrhosis Mother        nash  . COPD Father   .  Heart disease Father   . Asthma Other   . Heart disease Other   . Colon cancer Neg Hx   . Inflammatory bowel disease Neg Hx     Social History   Socioeconomic History  . Marital status:  Married    Spouse name: Kris Mouton  . Number of children: 0  . Years of education: Not on file  . Highest education level: Not on file  Occupational History    Employer: RETIRED  Tobacco Use  . Smoking status: Former Smoker    Packs/day: 2.00    Years: 12.00    Pack years: 24.00    Types: Cigarettes    Quit date: 03/31/1995    Years since quitting: 24.8  . Smokeless tobacco: Never Used  . Tobacco comment: quit in 1997  Vaping Use  . Vaping Use: Never used  Substance and Sexual Activity  . Alcohol use: No    Comment: Bottle of wine a day for years, but quit in 07/2011  . Drug use: No  . Sexual activity: Not Currently    Birth control/protection: Post-menopausal  Other Topics Concern  . Not on file  Social History Narrative   Lives   Caffeine use:    Quit drinking alcohol 3 YEARS ago. No drugs, no IVDU.    Social Determinants of Health   Financial Resource Strain:   . Difficulty of Paying Living Expenses: Not on file  Food Insecurity:   . Worried About Charity fundraiser in the Last Year: Not on file  . Ran Out of Food in the Last Year: Not on file  Transportation Needs:   . Lack of Transportation (Medical): Not on file  . Lack of Transportation (Non-Medical): Not on file  Physical Activity:   . Days of Exercise per Week: Not on file  . Minutes of Exercise per Session: Not on file  Stress:   . Feeling of Stress : Not on file  Social Connections:   . Frequency of Communication with Friends and Family: Not on file  . Frequency of Social Gatherings with Friends and Family: Not on file  . Attends Religious Services: Not on file  . Active Member of Clubs or Organizations: Not on file  . Attends Archivist Meetings: Not on file  . Marital Status: Not on file     Subjective: Review of Systems  Constitutional: Negative for chills, fever, malaise/fatigue and weight loss.  HENT: Negative for congestion and sore throat.   Respiratory: Negative for cough and shortness of breath.   Cardiovascular: Negative for chest pain and palpitations.  Gastrointestinal: Negative for abdominal pain, blood in stool, diarrhea, heartburn, melena, nausea and vomiting.       Dysphagia  Musculoskeletal: Negative for joint pain and myalgias.  Skin: Negative for rash.  Neurological: Negative for dizziness and weakness.  Endo/Heme/Allergies: Does not bruise/bleed easily.  Psychiatric/Behavioral: Negative for depression. The patient is not nervous/anxious.   All other systems reviewed and are negative.    Objective: BP 135/79   Pulse 84   Temp (!) 97.1 F (36.2 C) (Temporal)   Ht _0  (1.702 m)   Wt 259 lb (117.5 kg)   BMI 40.57 kg/m  Physical Exam Vitals and nursing note reviewed.  Constitutional:      General: She is not in acute distress.    Appearance: Normal appearance. She is well-developed. She is obese. She is not ill-appearing, toxic-appearing or diaphoretic.  HENT:     Head: Normocephalic and atraumatic.     Nose: No congestion or rhinorrhea.  Eyes:     General: No scleral icterus. Cardiovascular:     Rate and Rhythm: Normal rate and regular rhythm.     Heart sounds: Normal heart sounds.  Pulmonary:  Effort: Pulmonary effort is normal. No respiratory distress.     Breath sounds: Normal breath sounds.  Abdominal:     General: Bowel sounds are normal.     Palpations: Abdomen is soft. There is no hepatomegaly, splenomegaly or mass.     Tenderness: There is no abdominal tenderness. There is no guarding or rebound.     Hernia: No hernia is present.  Skin:    General: Skin is warm and dry.     Coloration: Skin is not jaundiced.     Findings: No rash.  Neurological:     General: No focal deficit present.     Mental Status: She is alert and  oriented to person, place, and time.  Psychiatric:        Attention and Perception: Attention normal.        Mood and Affect: Mood normal.        Speech: Speech normal.        Behavior: Behavior normal.        Thought Content: Thought content normal.        Cognition and Memory: Cognition and memory normal.      Assessment:  Very pleasant 64 year old female with history of cirrhosis who follows up due to worsening dysphagia symptoms.  Longstanding history of GERD and dysphagia, although symptoms have gotten progressively worse as far as her dysphagia is concerned.  No red flag/warning signs or symptoms.  Cirrhosis: Generally well compensated disease with a meld score of about 10.  Her labs are up-to-date, her ultrasound/liver imaging will need to be updated next month and I will put in an order for right upper quadrant ultrasound about the middle of the month.  Variceal screening is up-to-date  GERD: Symptoms continue to be well managed on acid blockers.  Recommend she continue her current medications  Solid food dysphagia: She has had occasional symptoms for years.  However, in the past couple months her symptoms have become more frequent as much as 1-2 times a day.  At this point she would like to seek treatment for this.  At this point I will schedule her for an upper endoscopy with possible dilation to further evaluate and treat her symptoms.  Further recommendations will follow  Proceed with EGD +/- dilation with Dr. Abbey Chatters on propofol/MAC in near future: the risks, benefits, and alternatives have been discussed with the patient in detail. The patient states understanding and desires to proceed.  ASA III   Plan: 1. Continue current medications 2. EGD with possible dilation 3. Follow-up in 6 months 4. Right upper quadrant ultrasound about 03/13/2020    Thank you for allowing [place] to participate in the care of [place], [place], AGNP-C Adult & Gerontological Nurse  Practitioner [facility]   02/08/2020 10:33 AM   Disclaimer: This note was dictated with voice recognition software. Similar sounding words can inadvertently be transcribed and may not be corrected upon review.

## 2020-02-08 NOTE — Telephone Encounter (Signed)
[place] abd RUQ scheduled for 03/13/20 at 8:30am, arrive at 8:15am. NPO after midnight before test.  Tried to call pt, no answer on cell phone, voice mailbox full. Called home#, husband will tell her to call office. Needs to schedule EGD/-/+DIL also.

## 2020-02-08 NOTE — Patient Instructions (Signed)
Your health issues we discussed today were:   GERD (reflux/heartburn) with dysphagia (swallowing difficulties): 1. Continue taking Pepcid for your reflux 2. Make sure you are chewing adequately when you eat and keep fluids on hand when you eat in case food gets stuck 3. We will plan for an upper endoscopy with possible dilation because of your swallowing difficulties 4. Further recommendations will follow your procedure 5. Let us know if having worsening or severe symptoms 6. If food gets stuck and will not go forward and will not regurgitate backward and lasts longer than 2 hours then proceed to the emergency room  Cirrhosis: 1. Overall things look like you are doing well 2. Your labs are up-to-date 3. We will update your ultrasound next month 4. Somebody from our office or radiology should call to schedule your appointment, I will recommend it be completed around the middle of the month 5. Call us if you have any worsening or concerning symptoms  Overall I recommend:  1. Continue your other current medications 2. Return for follow-up in 6 months 3. Call us for any questions or concerns   ---------------------------------------------------------------  I am glad you have gotten your COVID-19 vaccination!  Even though you are fully vaccinated you should continue to follow CDC and state/local guidelines.  ---------------------------------------------------------------   At [facility] we value your feedback. You may receive a survey about your visit today. Please share your experience as we strive to create trusting relationships with our patients to provide genuine, compassionate, quality care.  We appreciate your understanding and patience as we review any laboratory studies, imaging, and other diagnostic tests that are ordered as we care for you. Our office policy is 5 business days for review of these results, and any emergent or urgent results are addressed in a  timely manner for your best interest. If you do not hear from our office in 1 week, please contact us.   We also encourage the use of MyChart, which contains your medical information for your review as well. If you are not enrolled in this feature, an access code is on this after visit summary for your convenience. Thank you for allowing [place] to be involved in your care.  It was great to see you today!  I hope you have a Happy Thanksgiving!!

## 2020-02-12 NOTE — Telephone Encounter (Signed)
Tried to call pt, no answer, LMOVM for return call.  

## 2020-02-12 NOTE — Telephone Encounter (Signed)
Pt called office, informed of [place] appt. EGD/-/+DIL w/Dr. Abbey Chatters ASA 3 scheduled for 03/19/20 at 9:45am. Orders entered.

## 2020-02-12 NOTE — Telephone Encounter (Signed)
PA for EGD/DIL submitted via [facility] website. Case ID: [id number].

## 2020-02-12 NOTE — Telephone Encounter (Signed)
[place] appt letter mailed.

## 2020-02-13 NOTE — Telephone Encounter (Signed)
Pre-op/covid test 03/15/20. Letter mailed with procedure instructions.

## 2020-02-15 ENCOUNTER — Other Ambulatory Visit: Payer: Self-pay

## 2020-02-15 NOTE — Telephone Encounter (Signed)
Per [facility] website: PA not required for EGD. Comments: Policy Term: Customer service manager Termed as per Janeice Robinson on 01/28/20. (03/30/2018).

## 2020-03-11 ENCOUNTER — Telehealth: Payer: Self-pay | Admitting: Internal Medicine

## 2020-03-11 NOTE — Telephone Encounter (Signed)
PATIENT CALLED TO POSTPONE HER PROCEDURE

## 2020-03-11 NOTE — Telephone Encounter (Signed)
LMOVM for pt 

## 2020-03-12 ENCOUNTER — Telehealth: Payer: Self-pay

## 2020-03-12 NOTE — Telephone Encounter (Signed)
See prior note. Await further from [name], NP

## 2020-03-12 NOTE — Telephone Encounter (Signed)
Pt called and wants to speak with [name], NP about why she cancelled her EGD and wants to discuss rescheduling it. Pt is leaning toward rescheduling but only wants to discuss this with [name], NP first. Pt says it's no rush for [name] to call her. Pt can be reached on cell (call cell first) or home number.

## 2020-03-13 ENCOUNTER — Other Ambulatory Visit: Payer: Self-pay

## 2020-03-13 ENCOUNTER — Ambulatory Visit (HOSPITAL_COMMUNITY)
Admission: RE | Admit: 2020-03-13 | Discharge: 2020-03-13 | Disposition: A | Discharge status: Home / Self Care | Payer: Medicare HMO | Source: Ambulatory Visit | Attending: Nurse Practitioner | Admitting: Nurse Practitioner

## 2020-03-13 DIAGNOSIS — K219 Gastro-esophageal reflux disease without esophagitis: Secondary | ICD-10-CM | POA: Diagnosis present

## 2020-03-13 DIAGNOSIS — R1319 Other dysphagia: Secondary | ICD-10-CM

## 2020-03-13 DIAGNOSIS — K7030 Alcoholic cirrhosis of liver without ascites: Secondary | ICD-10-CM

## 2020-03-15 ENCOUNTER — Encounter (HOSPITAL_COMMUNITY): Admission: RE | Admit: 2020-03-15 | Payer: Medicare HMO | Source: Ambulatory Visit

## 2020-03-15 ENCOUNTER — Other Ambulatory Visit (HOSPITAL_COMMUNITY): Payer: Medicare HMO

## 2020-03-19 ENCOUNTER — Telehealth: Payer: Self-pay | Admitting: Internal Medicine

## 2020-03-19 NOTE — Telephone Encounter (Signed)
Called patient and left VM for callback. Will try again tomorrow.

## 2020-03-19 NOTE — Telephone Encounter (Signed)
See previous note

## 2020-03-19 NOTE — Telephone Encounter (Signed)
[phone] patient called asking to speak to you, she has a few questions

## 2020-03-20 NOTE — Telephone Encounter (Signed)
Called and discussed with patient. She had to cancel due to had dental work and was having significant dental pain and knew she wouldn't be able to tolerate bite block, etc. She is agreeable to reschedule EGD as previously recommended (see previous notes).  Will forward to [place]: let me know if any questions

## 2020-03-21 NOTE — Telephone Encounter (Signed)
Pre-op/covid test 04/26/20. Appt letter mailed with procedure instructions.

## 2020-03-21 NOTE — Telephone Encounter (Signed)
Called pt, EGD/-/+DIL w/Propofol w/Dr. Abbey Chatters ASA 3 rescheduled to 04/30/20 at 11:30am. Endo scheduler informed.

## 2020-04-25 NOTE — Patient Instructions (Signed)
[facility]  04/25/2020     @PREFPERIOPPHARMACY @   Your procedure is scheduled on  04/30/2020.   Report to [facility] at  0930  A.M.   Call this number if you have problems the morning of surgery:  [phone]   Remember:  Follow the diet instructions given to you by the office.                        Take these medicines the morning of surgery with A SIP OF WATER             Wellbutrin, cymbalta, pepcid, lamictal, xeljanz.    Please brush your teeth.  Do not wear jewelry, make-up or nail polish.  Do not wear lotions, powders, or perfumes, or deodorant.  Do not shave 48 hours prior to surgery.  Men may shave face and neck.  Do not bring valuables to the hospital.  [facility] is not responsible for any belongings or valuables.  Contacts, dentures or bridgework may not be worn into surgery.  Leave your suitcase in the car.  After surgery it may be brought to your room.  For patients admitted to the hospital, discharge time will be determined by your treatment team.  Patients discharged the day of surgery will not be allowed to drive home.    Special instructions:  DO NOT smoke (tobacco or vape) the morning of your procedure.  Please read over the following fact sheets that you were given. Anesthesia Post-op Instructions and Care and Recovery After Surgery       Upper Endoscopy, Adult, Care After This sheet gives you information about how to care for yourself after your procedure. Your health care provider may also give you more specific instructions. If you have problems or questions, contact your health care provider. What can I expect after the procedure? After the procedure, it is common to have:  A sore throat.  Mild stomach pain or discomfort.  Bloating.  Nausea. Follow these instructions at home:  Follow instructions from your health care provider about what to eat or drink after your procedure.  Return to your normal activities as  told by your health care provider. Ask your health care provider what activities are safe for you.  Take over-the-counter and prescription medicines only as told by your health care provider.  If you were given a sedative during the procedure, it can affect you for several hours. Do not drive or operate machinery until your health care provider says that it is safe.  Keep all follow-up visits as told by your health care provider. This is important.   Contact a health care provider if you have:  A sore throat that lasts longer than one day.  Trouble swallowing. Get help right away if:  You vomit blood or your vomit looks like coffee grounds.  You have: ? A fever. ? Bloody, black, or tarry stools. ? A severe sore throat or you cannot swallow. ? Difficulty breathing. ? Severe pain in your chest or abdomen. Summary  After the procedure, it is common to have a sore throat, mild stomach discomfort, bloating, and nausea.  If you were given a sedative during the procedure, it can affect you for several hours. Do not drive or operate machinery until your health care provider says that it is safe.  Follow instructions from your health care provider about what to eat or drink after your procedure.  Return to your normal activities as told by your health care provider. This information is not intended to replace advice given to you by your health care provider. Make sure you discuss any questions you have with your health care provider. Document Revised: 03/14/2019 Document Reviewed: 08/16/2017 Elsevier Patient Education  2021 [place].  https://www.asge.org/home/for-patients/patient-information/understanding-eso-dilation-updated">  Esophageal Dilatation Esophageal dilatation, also called esophageal dilation, is a procedure to widen or open a blocked or narrowed part of the esophagus. The esophagus is the part of the body that moves food and liquid from the mouth to the stomach. You may  need this procedure if:  You have a buildup of scar tissue in your esophagus that makes it difficult, painful, or impossible to swallow. This can be caused by gastroesophageal reflux disease (GERD).  You have cancer of the esophagus.  There is a problem with how food moves through your esophagus. In some cases, you may need this procedure repeated at a later time to dilate the esophagus gradually. Tell a health care provider about:  Any allergies you have.  All medicines you are taking, including vitamins, herbs, eye drops, creams, and over-the-counter medicines.  Any problems you or family members have had with anesthetic medicines.  Any blood disorders you have.  Any surgeries you have had.  Any medical conditions you have.  Any antibiotic medicines you are required to take before dental procedures.  Whether you are pregnant or may be pregnant. What are the risks? Generally, this is a safe procedure. However, problems may occur, including:  Bleeding due to a tear in the lining of the esophagus.  A hole, or perforation, in the esophagus. What happens before the procedure?  Ask your health care provider about: ? Changing or stopping your regular medicines. This is especially important if you are taking diabetes medicines or blood thinners. ? Taking medicines such as aspirin and ibuprofen. These medicines can thin your blood. Do not take these medicines unless your health care provider tells you to take them. ? Taking over-the-counter medicines, vitamins, herbs, and supplements.  Follow instructions from your health care provider about eating or drinking restrictions.  Plan to have a responsible adult take you home from the hospital or clinic.  Plan to have a responsible adult care for you for the time you are told after you leave the hospital or clinic. This is important. What happens during the procedure?  You may be given a medicine to help you relax (sedative).  A  numbing medicine may be sprayed into the back of your throat, or you may gargle the medicine.  Your health care provider may perform the dilatation using various surgical instruments, such as: ? Simple dilators. This instrument is carefully placed in the esophagus to stretch it. ? Guided wire bougies. This involves using an endoscope to insert a wire into the esophagus. A dilator is passed over this wire to enlarge the esophagus. Then the wire is removed. ? Balloon dilators. An endoscope with a small balloon is inserted into the esophagus. The balloon is inflated to stretch the esophagus and open it up. The procedure may vary among health care providers and hospitals. What can I expect after the procedure?  Your blood pressure, heart rate, breathing rate, and blood oxygen level will be monitored until you leave the hospital or clinic.  Your throat may feel slightly sore and numb. This will get better over time.  You will not be allowed to eat or drink until your throat is no  longer numb.  When you are able to drink, urinate, and sit on the edge of the bed without nausea or dizziness, you may be able to return home. Follow these instructions at home:  Take over-the-counter and prescription medicines only as told by your health care provider.  If you were given a sedative during the procedure, it can affect you for several hours. Do not drive or operate machinery until your health care provider says that it is safe.  Plan to have a responsible adult care for you for the time you are told. This is important.  Follow instructions from your health care provider about any eating or drinking restrictions.  Do not use any products that contain nicotine or tobacco, such as cigarettes, e-cigarettes, and chewing tobacco. If you need help quitting, ask your health care provider.  Keep all follow-up visits. This is important. Contact a health care provider if:  You have a fever.  You have pain  that is not relieved by medicine. Get help right away if:  You have chest pain.  You have trouble breathing.  You have trouble swallowing.  You vomit blood.  You have black, tarry, or bloody stools. These symptoms may represent a serious problem that is an emergency. Do not wait to see if the symptoms will go away. Get medical help right away. Call your local emergency services (911 in the [place] Do not drive yourself to the hospital. Summary  Esophageal dilatation, also called esophageal dilation, is a procedure to widen or open a blocked or narrowed part of the esophagus.  Plan to have a responsible adult take you home from the hospital or clinic.  For this procedure, a numbing medicine may be sprayed into the back of your throat, or you may gargle the medicine.  Do not drive or operate machinery until your health care provider says that it is safe. This information is not intended to replace advice given to you by your health care provider. Make sure you discuss any questions you have with your health care provider. Document Revised: 08/02/2019 Document Reviewed: 08/02/2019 Elsevier Patient Education  2021 [place] After This sheet gives you information about how to care for yourself after your procedure. Your health care provider may also give you more specific instructions. If you have problems or questions, contact your health care provider. What can I expect after the procedure? After the procedure, it is common to have:  Tiredness.  Forgetfulness about what happened after the procedure.  Impaired judgment for important decisions.  Nausea or vomiting.  Some difficulty with balance. Follow these instructions at home: For the time period you were told by your health care provider:  Rest as needed.  Do not participate in activities where you could fall or become injured.  Do not drive or use machinery.  Do not drink  alcohol.  Do not take sleeping pills or medicines that cause drowsiness.  Do not make important decisions or sign legal documents.  Do not take care of children on your own.      Eating and drinking  Follow the diet that is recommended by your health care provider.  Drink enough fluid to keep your urine pale yellow.  If you vomit: ? Drink water, juice, or soup when you can drink without vomiting. ? Make sure you have little or no nausea before eating solid foods. General instructions  Have a responsible adult stay with you for the time you are told.  It is important to have someone help care for you until you are awake and alert.  Take over-the-counter and prescription medicines only as told by your health care provider.  If you have sleep apnea, surgery and certain medicines can increase your risk for breathing problems. Follow instructions from your health care provider about wearing your sleep device: ? Anytime you are sleeping, including during daytime naps. ? While taking prescription pain medicines, sleeping medicines, or medicines that make you drowsy.  Avoid smoking.  Keep all follow-up visits as told by your health care provider. This is important. Contact a health care provider if:  You keep feeling nauseous or you keep vomiting.  You feel light-headed.  You are still sleepy or having trouble with balance after 24 hours.  You develop a rash.  You have a fever.  You have redness or swelling around the IV site. Get help right away if:  You have trouble breathing.  You have new-onset confusion at home. Summary  For several hours after your procedure, you may feel tired. You may also be forgetful and have poor judgment.  Have a responsible adult stay with you for the time you are told. It is important to have someone help care for you until you are awake and alert.  Rest as told. Do not drive or operate machinery. Do not drink alcohol or take sleeping  pills.  Get help right away if you have trouble breathing, or if you suddenly become confused. This information is not intended to replace advice given to you by your health care provider. Make sure you discuss any questions you have with your health care provider. Document Revised: 11/30/2019 Document Reviewed: 02/16/2019 Elsevier Patient Education  2021 [employer].

## 2020-04-26 ENCOUNTER — Encounter (HOSPITAL_COMMUNITY): Payer: Self-pay

## 2020-04-26 ENCOUNTER — Other Ambulatory Visit (HOSPITAL_COMMUNITY)
Admission: RE | Admit: 2020-04-26 | Discharge: 2020-04-26 | Disposition: A | Discharge status: Home / Self Care | Payer: Medicare HMO | Source: Ambulatory Visit | Attending: Internal Medicine | Admitting: Internal Medicine

## 2020-04-26 ENCOUNTER — Encounter (HOSPITAL_COMMUNITY)
Admission: RE | Admit: 2020-04-26 | Discharge: 2020-04-26 | Disposition: A | Discharge status: Home / Self Care | Payer: Medicare HMO | Source: Ambulatory Visit | Attending: Internal Medicine | Admitting: Internal Medicine

## 2020-04-26 ENCOUNTER — Other Ambulatory Visit: Payer: Self-pay

## 2020-04-26 DIAGNOSIS — Z20822 Contact with and (suspected) exposure to covid-19: Secondary | ICD-10-CM | POA: Diagnosis not present

## 2020-04-26 DIAGNOSIS — Z01818 Encounter for other preprocedural examination: Secondary | ICD-10-CM | POA: Insufficient documentation

## 2020-04-26 HISTORY — DX: Bipolar disorder, unspecified: F31.9

## 2020-04-26 LAB — SARS CORONAVIRUS 2 (TAT 6-24 HRS): SARS Coronavirus 2: NEGATIVE

## 2020-04-29 ENCOUNTER — Telehealth: Payer: Self-pay | Admitting: Internal Medicine

## 2020-04-29 ENCOUNTER — Encounter: Payer: Self-pay | Admitting: *Deleted

## 2020-04-29 NOTE — Telephone Encounter (Signed)
Called pt. She states she was told by endo that Dr. Abbey Chatters was going to do procedures in [place] tomorrow and since she was not agreeable she was told to call and reschedule. She has been rescheduled to 3/8 (am appt). Aware will mail new instructions. Called endo and made aware.

## 2020-04-29 NOTE — Telephone Encounter (Signed)
Patient called and said to please call her cellphone about rescheduling her procedure tomorrow

## 2020-05-04 ENCOUNTER — Other Ambulatory Visit: Payer: Self-pay | Admitting: Gastroenterology

## 2020-05-24 ENCOUNTER — Other Ambulatory Visit: Payer: Self-pay | Admitting: Nurse Practitioner

## 2020-05-28 ENCOUNTER — Other Ambulatory Visit: Payer: Self-pay

## 2020-05-28 ENCOUNTER — Encounter (HOSPITAL_COMMUNITY)
Admission: RE | Admit: 2020-05-28 | Discharge: 2020-05-28 | Disposition: A | Discharge status: Home / Self Care | Payer: Medicare HMO | Source: Ambulatory Visit | Attending: Internal Medicine | Admitting: Internal Medicine

## 2020-05-28 HISTORY — DX: Flat foot (pes planus) (acquired), left foot: M21.42

## 2020-05-29 ENCOUNTER — Encounter (HOSPITAL_COMMUNITY): Payer: Self-pay

## 2020-05-29 ENCOUNTER — Encounter: Payer: Self-pay | Admitting: Family Medicine

## 2020-05-29 ENCOUNTER — Ambulatory Visit: Payer: Medicare HMO | Admitting: Family Medicine

## 2020-05-29 ENCOUNTER — Other Ambulatory Visit: Payer: Self-pay

## 2020-05-29 VITALS — BP 124/74 | HR 84 | Temp 94.2°F | Wt 266.4 lb

## 2020-05-29 DIAGNOSIS — L4050 Arthropathic psoriasis, unspecified: Secondary | ICD-10-CM

## 2020-05-29 DIAGNOSIS — Z8673 Personal history of transient ischemic attack (TIA), and cerebral infarction without residual deficits: Secondary | ICD-10-CM | POA: Diagnosis not present

## 2020-05-29 DIAGNOSIS — I10 Essential (primary) hypertension: Secondary | ICD-10-CM | POA: Diagnosis not present

## 2020-05-29 DIAGNOSIS — N1831 Chronic kidney disease, stage 3a: Secondary | ICD-10-CM | POA: Diagnosis not present

## 2020-05-29 DIAGNOSIS — F3341 Major depressive disorder, recurrent, in partial remission: Secondary | ICD-10-CM

## 2020-05-29 MED ORDER — SPIRONOLACTONE 100 MG PO TABS: 50.0000 mg | ORAL_TABLET | Freq: Two times a day (BID) | ORAL | 1 refills | Status: DC

## 2020-05-29 MED ORDER — ATORVASTATIN CALCIUM 40 MG PO TABS
ORAL_TABLET | ORAL | 1 refills | Status: DC
Start: 2020-05-29 — End: 2021-01-29

## 2020-05-29 MED ORDER — LOSARTAN POTASSIUM 25 MG PO TABS
25.0000 mg | ORAL_TABLET | Freq: Every day | ORAL | 1 refills | Status: DC
Start: 2020-05-29 — End: 2020-06-27

## 2020-05-29 MED ORDER — BUPROPION HCL ER (SR) 100 MG PO TB12: ORAL_TABLET | ORAL | 5 refills | Status: DC

## 2020-05-29 NOTE — Progress Notes (Signed)
Patient ID: Deanna Thompson, female    DOB: 09/22/1955, 65 y.o.   MRN: [id number]   Chief Complaint  Patient presents with  . Hypertension   Subjective:    HPI Pt here for follow up on blood pressure. Pt states her blood pressure has been good. No issues.   Had bad vertigo few months ago.  Had some vomiting.  Did some PT exercises to help and resolved the vertigo. Doesn't take meds for it. Had bad attack in past.  Feeling if doing exercsing and it resolved it.  Was supposed to have egd.   Hobbies- Making jewelry.  Mostly with wire or paper.  covid 19- Had it 1 yr ago in jan 21.  Resolved and had gi part more with fever and bad headache. Husband thinking has long-haulers syndrome.  Cirrhosis and ckd from alcohol use in past. Seeing RA doctor and they are looking at labs.  Cr slight elevated 1.21- 1.3.  They are keeping an eye on it.    [place] has a past medical history of Abdominal hernia, Abscess of pulp of tooth (12/12/14), Anxiety, Arthritis, B12 deficiency (06/12/2015), Bipolar disorder ([place]), Brain aneurysm, Cirrhosis ([place]), Complication of anesthesia, Depression, ETOH abuse, Fatigue, Flat foot (pes planus) (acquired), left foot, Folate deficiency (06/30/2016), GERD (gastroesophageal reflux disease), HTN (hypertension), IBS (irritable bowel syndrome), Iron deficiency anemia due to chronic blood loss, Myofascial pain, Panic attack, PONV (postoperative nausea and vomiting), Psoriatic arthritis ([place]), Right inguinal hernia (08/16/2012), Seizures ([place]) (1997), Sjogren's disease ([place]), Stroke ([place]) (2019), 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   Outpatient Encounter Medications as of 05/29/2020  Medication Sig  . bumetanide (BUMEX) 1 MG tablet Take 1 tablet (1 mg total) by mouth 2 (two) times daily.  . Camphor-Menthol-Methyl Sal (SALONPAS) 3.04-04-08 % PTCH Place 1  patch onto the skin daily as needed (pain.).   Marland Kitchen cholecalciferol (VITAMIN D3) 25 MCG (1000 UT) tablet Take 2 tablets (2,000 Units total) by mouth daily. (Patient taking differently: Take 1,000 Units by mouth every morning.)  . DULoxetine (CYMBALTA) 20 MG capsule Take 40 mg by mouth daily.   . famotidine (PEPCID) 20 MG tablet Take 1 tablet (20 mg total) by mouth 2 (two) times daily before a meal.  . fluticasone (FLONASE) 50 MCG/ACT nasal spray Place 1 spray into both nostrils See admin instructions. Instill 1 spray into each nostril every morning, may also use later in the day as needed for sinus congestion  . hydrocortisone 2.5 % ointment Apply topically 2 (two) times daily. Topically to eyelid rash for 1 week.  . lamoTRIgine (LAMICTAL) 150 MG tablet Take 150 mg by mouth daily.  . Lifitegrast 5 % SOLN Place 1 drop into both eyes 2 (two) times daily.  Marland Kitchen LINZESS 145 MCG CAPS capsule TAKE (1) CAPSULE BY MOUTH EVERY DAY BEFORE BREAKFAST.  Marland Kitchen loratadine (CLARITIN) 10 MG tablet Take 10 mg by mouth daily.  . Polyvinyl Alcohol-Povidone (REFRESH OP) Place 1 drop into both eyes 3 (three) times daily as needed (dry eyes).  . Simethicone (GAS-X PO) Take 1 tablet by mouth 2 (two) times daily as needed (bloating/flatulence).  . sodium chloride (OCEAN) 0.65 % nasal spray Place 1 spray into the nose 2 (two) times daily as needed for congestion.   . Tofacitinib Citrate ER (XELJANZ XR) 11 MG TB24 Take 11 mg by mouth daily.   . [DISCONTINUED] atorvastatin (LIPITOR) 40 MG tablet TAKE (1) TABLET BY MOUTH  ONCE DAILY. (Patient taking differently: Take 40 mg by mouth daily. TAKE (1) TABLET BY MOUTH ONCE DAILY.)  . [DISCONTINUED] buPROPion (WELLBUTRIN SR) 100 MG 12 hr tablet TAKE (1) TABLET BY MOUTH TWICE DAILY. (Patient taking differently: Take 100 mg by mouth 2 (two) times daily.)  . [DISCONTINUED] losartan (COZAAR) 25 MG tablet Take 1 tablet (25 mg total) by mouth daily.  . [DISCONTINUED] spironolactone (ALDACTONE) 100 MG  tablet Take 0.5 tablets (50 mg total) by mouth 2 (two) times daily.  Marland Kitchen atorvastatin (LIPITOR) 40 MG tablet TAKE (1) TABLET BY MOUTH ONCE DAILY.  Marland Kitchen buPROPion (WELLBUTRIN SR) 100 MG 12 hr tablet TAKE (1) TABLET BY MOUTH TWICE DAILY.  Marland Kitchen losartan (COZAAR) 25 MG tablet Take 1 tablet (25 mg total) by mouth daily.  Marland Kitchen spironolactone (ALDACTONE) 100 MG tablet Take 0.5 tablets (50 mg total) by mouth 2 (two) times daily.   No facility-administered encounter medications on file as of 05/29/2020.     Review of Systems  Constitutional: Negative for chills and fever.  HENT: Negative for congestion, rhinorrhea and sore throat.   Respiratory: Negative for cough, shortness of breath and wheezing.   Cardiovascular: Negative for chest pain and leg swelling.  Gastrointestinal: Negative for abdominal pain, diarrhea, nausea and vomiting.  Genitourinary: Negative for dysuria and frequency.  Musculoskeletal: Negative for arthralgias and back pain.  Skin: Negative for rash.  Neurological: Negative for dizziness, weakness and headaches.     Vitals BP 124/74   Pulse 84   Temp (!) 94.2 F (34.6 C)   Wt 266 lb 6.4 oz (120.8 kg)   SpO2 96%   BMI 41.72 kg/m   Objective:   Physical Exam Vitals and nursing note reviewed.  Constitutional:      Appearance: Normal appearance.  HENT:     Head: Normocephalic and atraumatic.     Nose: Nose normal.     Mouth/Throat:     Mouth: Mucous membranes are moist.     Pharynx: Oropharynx is clear.  Eyes:     Extraocular Movements: Extraocular movements intact.     Conjunctiva/sclera: Conjunctivae normal.     Pupils: Pupils are equal, round, and reactive to light.  Cardiovascular:     Rate and Rhythm: Normal rate and regular rhythm.     Pulses: Normal pulses.     Heart sounds: Normal heart sounds.  Pulmonary:     Effort: Pulmonary effort is normal.     Breath sounds: Normal breath sounds. No wheezing, rhonchi or rales.  Musculoskeletal:        General: Normal  range of motion.     Right lower leg: No edema.     Left lower leg: No edema.  Skin:    General: Skin is warm and dry.     Findings: No lesion or rash.  Neurological:     General: No focal deficit present.     Mental Status: She is alert and oriented to person, place, and time.  Psychiatric:        Mood and Affect: Mood normal.        Behavior: Behavior normal.      Assessment and Plan   1. Essential hypertension - CBC - CMP14+EGFR - Lipid panel - spironolactone (ALDACTONE) 100 MG tablet; Take 0.5 tablets (50 mg total) by mouth 2 (two) times daily.  Dispense: 90 tablet; Refill: 1 - losartan (COZAAR) 25 MG tablet; Take 1 tablet (25 mg total) by mouth daily.  Dispense: 90 tablet; Refill: 1  2.  History of cardioembolic cerebrovascular accident (CVA) - atorvastatin (LIPITOR) 40 MG tablet; TAKE (1) TABLET BY MOUTH ONCE DAILY.  Dispense: 90 tablet; Refill: 1  3. Stage 3a chronic kidney disease ([place])  4. Psoriatic arthritis ([place])  5. Recurrent major depressive disorder, in partial remission (HCC) - buPROPion (WELLBUTRIN SR) 100 MG 12 hr tablet; TAKE (1) TABLET BY MOUTH TWICE DAILY.  Dispense: 60 tablet; Refill: 5   Pt had labs in [place], will get request from her RA doctor.   htn- stable. Cont meds.  H/o psoriatic arthritis- stable. cont f/u with rheum.  H/o ckd3- stable, cont to f/u nephrologist. Last Cr- 1.21.  Depression- stable. Cont wellbutrin.  Return in about 6 months (around 11/29/2020) for f/u hld. Marland Kitchen

## 2020-05-31 ENCOUNTER — Other Ambulatory Visit: Payer: Self-pay

## 2020-05-31 ENCOUNTER — Other Ambulatory Visit (HOSPITAL_COMMUNITY)
Admission: RE | Admit: 2020-05-31 | Discharge: 2020-05-31 | Disposition: A | Discharge status: Home / Self Care | Payer: Medicare HMO | Source: Ambulatory Visit | Attending: Internal Medicine | Admitting: Internal Medicine

## 2020-05-31 DIAGNOSIS — Z20822 Contact with and (suspected) exposure to covid-19: Secondary | ICD-10-CM | POA: Diagnosis not present

## 2020-05-31 DIAGNOSIS — Z01812 Encounter for preprocedural laboratory examination: Secondary | ICD-10-CM | POA: Diagnosis present

## 2020-06-01 LAB — SARS CORONAVIRUS 2 (TAT 6-24 HRS): SARS Coronavirus 2: NEGATIVE

## 2020-06-04 ENCOUNTER — Encounter (HOSPITAL_COMMUNITY): Payer: Self-pay

## 2020-06-04 ENCOUNTER — Ambulatory Visit (HOSPITAL_COMMUNITY)
Admission: RE | Admit: 2020-06-04 | Discharge: 2020-06-04 | Disposition: A | Discharge status: Home / Self Care | Payer: Medicare HMO | Attending: Internal Medicine | Admitting: Internal Medicine

## 2020-06-04 ENCOUNTER — Ambulatory Visit (HOSPITAL_COMMUNITY): Payer: Medicare HMO | Admitting: Anesthesiology

## 2020-06-04 ENCOUNTER — Encounter (HOSPITAL_COMMUNITY)
Admission: RE | Disposition: A | Discharge status: Home / Self Care | Payer: Self-pay | Source: Home / Self Care | Attending: Internal Medicine

## 2020-06-04 DIAGNOSIS — Z91048 Other nonmedicinal substance allergy status: Secondary | ICD-10-CM | POA: Diagnosis not present

## 2020-06-04 DIAGNOSIS — Z881 Allergy status to other antibiotic agents status: Secondary | ICD-10-CM | POA: Insufficient documentation

## 2020-06-04 DIAGNOSIS — K229 Disease of esophagus, unspecified: Secondary | ICD-10-CM | POA: Insufficient documentation

## 2020-06-04 DIAGNOSIS — K2970 Gastritis, unspecified, without bleeding: Secondary | ICD-10-CM | POA: Diagnosis not present

## 2020-06-04 DIAGNOSIS — R1310 Dysphagia, unspecified: Secondary | ICD-10-CM

## 2020-06-04 DIAGNOSIS — Z87891 Personal history of nicotine dependence: Secondary | ICD-10-CM | POA: Insufficient documentation

## 2020-06-04 DIAGNOSIS — Z87892 Personal history of anaphylaxis: Secondary | ICD-10-CM | POA: Insufficient documentation

## 2020-06-04 DIAGNOSIS — Z79899 Other long term (current) drug therapy: Secondary | ICD-10-CM | POA: Diagnosis not present

## 2020-06-04 DIAGNOSIS — Z888 Allergy status to other drugs, medicaments and biological substances status: Secondary | ICD-10-CM | POA: Diagnosis not present

## 2020-06-04 HISTORY — PX: ESOPHAGOGASTRODUODENOSCOPY (EGD) WITH PROPOFOL: SHX5813

## 2020-06-04 HISTORY — PX: BIOPSY: SHX5522

## 2020-06-04 SURGERY — ESOPHAGOGASTRODUODENOSCOPY (EGD) WITH PROPOFOL
Anesthesia: General

## 2020-06-04 MED ORDER — LACTATED RINGERS IV SOLN: INTRAVENOUS | Status: DC

## 2020-06-04 MED ORDER — PROPOFOL 10 MG/ML IV BOLUS
INTRAVENOUS | Status: DC | PRN
  Administered 2020-06-04: 100 mg via INTRAVENOUS
  Administered 2020-06-04: 80 mg via INTRAVENOUS

## 2020-06-04 NOTE — Anesthesia Postprocedure Evaluation (Signed)
Anesthesia Post Note  Patient: [facility]  Procedure(s) Performed: ESOPHAGOGASTRODUODENOSCOPY (EGD) WITH PROPOFOL (N/A ) BIOPSY  Patient location during evaluation: PACU Anesthesia Type: General Level of consciousness: awake and alert and oriented Pain management: pain level controlled Vital Signs Assessment: post-procedure vital signs reviewed and stable Respiratory status: spontaneous breathing, nonlabored ventilation and respiratory function stable Cardiovascular status: blood pressure returned to baseline and stable Postop Assessment: no apparent nausea or vomiting Anesthetic complications: no   No complications documented.   Last Vitals:  Vitals:   06/04/20 1200 06/04/20 1214  BP: (!) 123/58 (!) 142/85  Pulse: 73 62  Resp: 16 16  Temp:  36.6 C  SpO2: 98% 100%    Last Pain:  Vitals:   06/04/20 1214  TempSrc: Oral  PainSc: 0-No pain                 Orlie Dakin

## 2020-06-04 NOTE — Discharge Instructions (Signed)
EGD Discharge instructions Please read the instructions outlined below and refer to this sheet in the next few weeks. These discharge instructions provide you with general information on caring for yourself after you leave the hospital. Your doctor may also give you specific instructions. While your treatment has been planned according to the most current medical practices available, unavoidable complications occasionally occur. If you have any problems or questions after discharge, please call your doctor. ACTIVITY  You may resume your regular activity but move at a slower pace for the next 24 hours.   Take frequent rest periods for the next 24 hours.   Walking will help expel (get rid of) the air and reduce the bloated feeling in your abdomen.   No driving for 24 hours (because of the anesthesia (medicine) used during the test).   You may shower.   Do not sign any important legal documents or operate any machinery for 24 hours (because of the anesthesia used during the test).  NUTRITION  Drink plenty of fluids.   You may resume your normal diet.   Begin with a light meal and progress to your normal diet.   Avoid alcoholic beverages for 24 hours or as instructed by your caregiver.  MEDICATIONS  You may resume your normal medications unless your caregiver tells you otherwise.  WHAT YOU CAN EXPECT TODAY  You may experience abdominal discomfort such as a feeling of fullness or "gas" pains.  FOLLOW-UP  Your doctor will discuss the results of your test with you.  SEEK IMMEDIATE MEDICAL ATTENTION IF ANY OF THE FOLLOWING OCCUR:  Excessive nausea (feeling sick to your stomach) and/or vomiting.   Severe abdominal pain and distention (swelling).   Trouble swallowing.   Temperature over 101 F (37.8 C).   Rectal bleeding or vomiting of blood.   Your EGD did not reveal any varices.  You did have a nodule at the junction between your esophagus and stomach which I biopsied.  No  obvious stricture or stenosis of your esophagus.  Your esophagus was wide open so I did not need to dilate today.  We may need to consider manometry as a next step to evaluate your difficulty swallowing.  Continue current medications.  Await pathology results, my office will contact you.  Follow-up with GI as previously scheduled.   I hope you have a great rest of your week!  Elon Alas. Abbey Chatters, D.O. Gastroenterology and Hepatology [facility]

## 2020-06-04 NOTE — Op Note (Signed)
[facility] Patient Name: [facility] Procedure Date: 06/04/2020 11:18 AM MRN: [id number] Date of Birth: [birth year] Attending MD: Elon Alas. Abbey Chatters DO CSN: [id number] Age: 65 Admit Type: Outpatient Procedure:                Upper GI endoscopy Indications:              Dysphagia Providers:                Elon Alas. Abbey Chatters, DO, Gwenlyn Fudge, RN, Nelma Rothman,                            Technician Referring MD:              Medicines:                See the Anesthesia note for documentation of the                            administered medications Complications:            No immediate complications. Estimated Blood Loss:     Estimated blood loss was minimal. Procedure:                Pre-Anesthesia Assessment:                           - The anesthesia plan was to use monitored                            anesthesia care (MAC).                           After obtaining informed consent, the endoscope was                            passed under direct vision. Throughout the                            procedure, the patient's blood pressure, pulse, and                            oxygen saturations were monitored continuously. The                            GIF-H190 ([id number]) scope was introduced through the                            mouth, and advanced to the second part of duodenum.                            The upper GI endoscopy was accomplished without                            difficulty. The patient tolerated the procedure                            well. Scope In: 11:34:26 AM Scope Out:  11:39:55 AM Total Procedure Duration: 0 hours 5 minutes 29 seconds  Findings:      A single nodule was found at the gastroesophageal junction. Biopsies       were taken with a cold forceps for histology.      There is no endoscopic evidence of varices in the entire esophagus.      Diffuse mild inflammation characterized by erythema was found in the       entire examined stomach.       The duodenal bulb, first portion of the duodenum and second portion of       the duodenum were normal.      There is no endoscopic evidence of stenosis or stricture in the entire       esophagus. Impression:               - Nodule found in the esophagus. Biopsied.                           - Gastritis.                           - Normal duodenal bulb, first portion of the                            duodenum and second portion of the duodenum. Moderate Sedation:      Per Anesthesia Care Recommendation:           - Patient has a contact number available for                            emergencies. The signs and symptoms of potential                            delayed complications were discussed with the                            patient. Return to normal activities tomorrow.                            Written discharge instructions were provided to the                            patient.                           - Resume previous diet.                           - Continue present medications.                           - Await pathology results.                           - Repeat upper endoscopy in 3 years for                            surveillance.                           -  Return to GI clinic as previously scheduled. Procedure Code(s):        --- Professional ---                           [phone], Esophagogastroduodenoscopy, flexible,                            transoral; with biopsy, single or multiple Diagnosis Code(s):        --- Professional ---                           K22.8, Other specified diseases of esophagus                           K29.70, Gastritis, unspecified, without bleeding                           R13.10, Dysphagia, unspecified CPT copyright 2019 American Medical Association. All rights reserved. The codes documented in this report are preliminary and upon coder review may  be revised to meet current compliance requirements. Elon Alas. Abbey Chatters, DO [place]  Abbey Chatters, DO 06/04/2020 11:43:09 AM This report has been signed electronically. Number of Addenda: 0

## 2020-06-04 NOTE — H&P (Signed)
Primary Care Physician:  Erven Colla, DO Primary Gastroenterologist:  Dr. Abbey Chatters  Pre-Procedure History & Physical: HPI:  Deanna Thompson is a 65 y.o. female is here for an EGD for dysphagia. She is having more frequent dysphagia symptoms. Has had dysphagia intermittently since aneurism surgery 20 years ago. Was having issues once every week or two which she was ok with. In the past few months she has had more frequent symptoms, as often as once a day. This is more problematic and is wanting to try and address this. Denies ongoing abdominal pain, N/V, hematochezia, melena, fever, chills, unintentional weight loss. Denies any other upper or lower GI symptoms.  Past Medical History:  Diagnosis Date  . Abdominal hernia    repaired  . Abscess of pulp of tooth 12/12/14   left side  . Anxiety   . Arthritis    back  and down flank  right side more than left  . B12 deficiency 06/12/2015  . Bipolar disorder ([place])   . Brain aneurysm    METAL COIL PREVENTS MRI IMAGING and CLIP  . Cirrhosis ([place])    Presumably alcoholic cirrhosis  . Complication of anesthesia   . Depression   . ETOH abuse    Quit in 07/2011   . Fatigue    due to Sjogren's disease  . Flat foot (pes planus) (acquired), left foot   . Folate deficiency 06/30/2016  . GERD (gastroesophageal reflux disease)   . HTN (hypertension)    no longer medicated, dated on 04/25/12  . IBS (irritable bowel syndrome)   . Iron deficiency anemia due to chronic blood loss   . Myofascial pain   . Panic attack   . PONV (postoperative nausea and vomiting)   . Psoriatic arthritis ([place])   . Right inguinal hernia 08/16/2012  . Seizures ([place]) 1997   1 seizure from coil insertion but no more seizures and no meds for seizures  . Sjogren's disease ([place])   . Stroke ([place]) 2019  . [place] HEAD 02/03/2007   Qualifier: Diagnosis of  By: Aline Brochure MD, Dorothyann Peng    . TIA (transient ischemic attack)   . 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([place])   . Vertigo     Past Surgical History:  Procedure Laterality Date  . AGILE CAPSULE N/A 06/21/2014   Procedure: AGILE CAPSULE;  Surgeon: Danie Binder, MD;  [facility];  Service: Endoscopy;  Laterality: N/A;  0700  . BIOPSY  10/13/2011   SLF: mild gastritis/duodenal polypoid lesion   . CEREBRAL ANEURYSM REPAIR  1997  . CHOLECYSTECTOMY N/A 10/28/2018   Procedure: LAPAROSCOPIC CHOLECYSTECTOMY WITH INTRAOPERATIVE CHOLANGIOGRAM;  Surgeon: Virl Cagey, MD;  Location: [facility] ORS;  Service: General;  Laterality: N/A;  . COLONOSCOPY  10/13/2011   IPJ:[id number] hemorrhoids/varices rectal/lesion in ascending colon(HYPERPLASTIC POLYP)  . COLONOSCOPY N/A 01/17/2013   SLF: 1. Internal hemorrhoids 2. Ascending colon polyps 3.  Rectal varices.   . COLONOSCOPY N/A 08/10/2016   Dr. Oneida Alar: redundant left colon. external and internal hemorhroids. 5-10 year surveillance  . ESOPHAGOGASTRODUODENOSCOPY N/A 06/29/2014   SLF: 1. anemia due to polypoid lesions in teh antrum, and duodenal polyps 2. single duodenum AVM  . ESOPHAGOGASTRODUODENOSCOPY (EGD) WITH PROPOFOL N/A 03/07/2019   Procedure: ESOPHAGOGASTRODUODENOSCOPY (EGD) WITH PROPOFOL;  Surgeon: Danie Binder, MD;  [facility];  Service: Endoscopy;  Laterality: N/A;  1:00pm  . ESOPHAGOGASTRODUODENOSCOPY (EGD) WITH PROPOFOL N/A 03/16/2019   Procedure: ESOPHAGOGASTRODUODENOSCOPY (EGD) WITH PROPOFOL;  Surgeon: Danie Binder, MD;  [facility];  Service: Endoscopy;  Laterality: N/A;  . FLEXIBLE SIGMOIDOSCOPY N/A 01/21/2016   Procedure: FLEXIBLE SIGMOIDOSCOPY;  Surgeon: Danie Binder, MD;  [facility];  Service: Endoscopy;  Laterality: N/A;  245  . GIVENS CAPSULE STUDY N/A 06/29/2014   Procedure: GIVENS CAPSULE STUDY;  Surgeon: Danie Binder, MD;  [facility];  Service: Endoscopy;  Laterality: N/A;  . GIVENS CAPSULE STUDY N/A 07/13/2014   Procedure: GIVENS CAPSULE STUDY;  Surgeon: Danie Binder,  MD;  [facility];  Service: Endoscopy;  Laterality: N/A;  700  . HEMORRHOID BANDING N/A 01/21/2016   Procedure: HEMORRHOID BANDING;  Surgeon: Danie Binder, MD;  [facility];  Service: Endoscopy;  Laterality: N/A;  . HEMOSTASIS CLIP PLACEMENT  03/16/2019   Procedure: HEMOSTASIS CLIP PLACEMENT;  Surgeon: Danie Binder, MD;  [facility];  Service: Endoscopy;;  x 4  . HEMOSTASIS CONTROL  03/16/2019   Procedure: HEMOSTASIS CONTROL;  Surgeon: Danie Binder, MD;  [facility];  Service: Endoscopy;;  injection with epinephine 0.1mg /ml via scope and bipolar cautery with gold probe  . INGUINAL HERNIA REPAIR Right 08/28/2013   Procedure: RIGHT NGUINAL HERNIORRHAPHY WITH MESH;  Surgeon: Jamesetta So, MD;  Location: [facility];  Service: General;  Laterality: Right;  . INSERTION OF MESH N/A 06/09/2013   Procedure: INSERTION OF MESH;  Surgeon: Jamesetta So, MD;  Location: [facility];  Service: General;  Laterality: N/A;  . INSERTION OF MESH Right 08/28/2013   Procedure: INSERTION OF MESH;  Surgeon: Jamesetta So, MD;  Location: [facility];  Service: General;  Laterality: Right;  . PARACENTESIS  Feb 2014   10.7 liters  . TOOTH EXTRACTION Left 12/12/14  . UMBILICAL HERNIA REPAIR N/A 06/09/2013   Procedure: UMBILICAL HERNIORRHAPHY WITH MESH;  Surgeon: Jamesetta So, MD;  Location: [facility];  Service: General;  Laterality: N/A;    Prior to Admission medications   Medication Sig Start Date End Date Taking? Authorizing Provider  atorvastatin (LIPITOR) 40 MG tablet TAKE (1) TABLET BY MOUTH ONCE DAILY. 05/29/20  Yes Lovena Le, Malena M, DO  bumetanide (BUMEX) 1 MG tablet Take 1 tablet (1 mg total) by mouth 2 (two) times daily. 05/06/20  Yes Annitta Needs, NP  buPROPion (WELLBUTRIN SR) 100 MG 12 hr tablet TAKE (1) TABLET BY MOUTH TWICE DAILY. 05/29/20  Yes Lovena Le, Malena M, DO  Camphor-Menthol-Methyl Sal (SALONPAS) 3.04-04-08 % PTCH Place 1 patch onto the skin daily as needed (pain.).     Yes [provider]  cholecalciferol (VITAMIN D3) 25 MCG (1000 UT) tablet Take 2 tablets (2,000 Units total) by mouth daily. Patient taking differently: Take 1,000 Units by mouth every morning. 12/14/18  Yes Fields, Sandi L, MD  DULoxetine (CYMBALTA) 20 MG capsule Take 40 mg by mouth daily.  06/10/19  Yes [provider]  famotidine (PEPCID) 20 MG tablet Take 1 tablet (20 mg total) by mouth 2 (two) times daily before a meal. 10/17/19  Yes Jodi Mourning, Kristen S, PA-C  fluticasone (FLONASE) 50 MCG/ACT nasal spray Place 1 spray into both nostrils See admin instructions. Instill 1 spray into each nostril every morning, may also use later in the day as needed for sinus congestion   Yes [provider]  hydrocortisone 2.5 % ointment Apply topically 2 (two) times daily. Topically to eyelid rash for 1 week. 11/30/19  Yes Lovena Le, Malena M, DO  lamoTRIgine (LAMICTAL) 150 MG tablet  Take 150 mg by mouth daily. 10/18/19  Yes [provider]  Lifitegrast 5 % SOLN Place 1 drop into both eyes 2 (two) times daily.   Yes [provider]  LINZESS 145 MCG CAPS capsule TAKE (1) CAPSULE BY MOUTH EVERY DAY BEFORE BREAKFAST. 05/27/20  Yes Jodi Mourning, Kristen S, PA-C  loratadine (CLARITIN) 10 MG tablet Take 10 mg by mouth daily.   Yes [provider]  losartan (COZAAR) 25 MG tablet Take 1 tablet (25 mg total) by mouth daily. 05/29/20  Yes Lovena Le, Malena M, DO  Polyvinyl Alcohol-Povidone (REFRESH OP) Place 1 drop into both eyes 3 (three) times daily as needed (dry eyes).   Yes [provider]  Simethicone (GAS-X PO) Take 1 tablet by mouth 2 (two) times daily as needed (bloating/flatulence).   Yes [provider]  sodium chloride (OCEAN) 0.65 % nasal spray Place 1 spray into the nose 2 (two) times daily as needed for congestion.    Yes [provider]  spironolactone (ALDACTONE) 100 MG tablet Take 0.5 tablets (50 mg total) by mouth 2 (two) times daily. 05/29/20  Yes  Lovena Le, Malena M, DO  Tofacitinib Citrate ER (XELJANZ XR) 11 MG TB24 Take 11 mg by mouth daily.    Yes [provider]    Allergies as of 02/12/2020 - Review Complete 02/08/2020  Allergen Reaction Noted  . Omeprazole Shortness Of Breath 08/19/2011  . Orencia [abatacept] Anaphylaxis, Hives, and Rash 10/25/2017  . Other Shortness Of Breath and Rash 01/09/2013  . Phenytoin Rash   . Keflex [cephalexin] Other (See Comments)   . Lidocaine viscous hcl  03/16/2019  . Phenergan [promethazine hcl] Other (See Comments) 04/08/2012    Family History  Problem Relation Age of Onset  . Cirrhosis Mother        nash  . COPD Father   . Heart disease Father   . Asthma Other   . Heart disease Other   . Colon cancer Neg Hx   . Inflammatory bowel disease Neg Hx     Social History   Socioeconomic History  . Marital status: Married    Spouse name: Kris Mouton  . Number of children: 0  . Years of education: Not on file  . Highest education level: Not on file  Occupational History    Employer: RETIRED  Tobacco Use  . Smoking status: Former Smoker    Packs/day: 2.00    Years: 12.00    Pack years: 24.00    Types: Cigarettes    Quit date: 03/31/1995    Years since quitting: 25.1  . Smokeless tobacco: Never Used  . Tobacco comment: quit in 1997  Vaping Use  . Vaping Use: Never used  Substance and Sexual Activity  . Alcohol use: No    Comment: Bottle of wine a day for years, but quit in 07/2011  . Drug use: No  . Sexual activity: Not Currently    Birth control/protection: Post-menopausal  Other Topics Concern  . Not on file  Social History Narrative   Lives   Caffeine use:    Quit drinking alcohol 3 YEARS ago. No drugs, no IVDU.    Social Determinants of Health   Financial Resource Strain: Not on file  Food Insecurity: Not on file  Transportation Needs: Not on file  Physical Activity: Not on file  Stress: Not on file  Social Connections: Not on file  Intimate Partner  Violence: Not on file    Review of Systems: See HPI, otherwise negative  ROS  Physical Exam: Vital signs in last 24 hours: Temp:  [98 F (36.7 C)] 98 F (36.7 C) (03/08 0956) Pulse Rate:  [78] 78 (03/08 0956) Resp:  [18] 18 (03/08 0956) BP: (143)/(78) 143/78 (03/08 0956) SpO2:  [98 %] 98 % (03/08 0956)   General:   Alert,  Well-developed, well-nourished, pleasant and cooperative in NAD Head:  Normocephalic and atraumatic. Eyes:  Sclera clear, no icterus.   Conjunctiva pink. Ears:  Normal auditory acuity. Nose:  No deformity, discharge,  or lesions. Mouth:  No deformity or lesions, dentition normal. Neck:  Supple; no masses or thyromegaly. Lungs:  Clear throughout to auscultation.   No wheezes, crackles, or rhonchi. No acute distress. Heart:  Regular rate and rhythm; no murmurs, clicks, rubs,  or gallops. Abdomen:  Soft, nontender and nondistended. No masses, hepatosplenomegaly or hernias noted. Normal bowel sounds, without guarding, and without rebound.   Msk:  Symmetrical without gross deformities. Normal posture. Extremities:  Without clubbing or edema. Neurologic:  Alert and  oriented x4;  grossly normal neurologically. Skin:  Intact without significant lesions or rashes. Cervical Nodes:  No significant cervical adenopathy. Psych:  Alert and cooperative. Normal mood and affect.  Impression/Plan: [place] is here for an EGD for dysphagia.   The risks of the procedure including infection, bleed, or perforation as well as benefits, limitations, alternatives and imponderables have been reviewed with the patient. Questions have been answered. All parties agreeable.

## 2020-06-04 NOTE — Transfer of Care (Signed)
Immediate Anesthesia Transfer of Care Note  Patient: [facility]  Procedure(s) Performed: ESOPHAGOGASTRODUODENOSCOPY (EGD) WITH PROPOFOL (N/A ) BIOPSY  Patient Location: PACU  Anesthesia Type:General  Level of Consciousness: awake, alert  and oriented  Airway & Oxygen Therapy: Patient Spontanous Breathing  Post-op Assessment: Report given to RN and Post -op Vital signs reviewed and stable  Post vital signs: Reviewed and stable  Last Vitals:  Vitals Value Taken Time  BP 114/44 06/04/20 1145  Temp    Pulse 69 06/04/20 1146  Resp 17 06/04/20 1146  SpO2 99 % 06/04/20 1146  Vitals shown include unvalidated device data.  Last Pain:  Vitals:   06/04/20 1130  TempSrc:   PainSc: 0-No pain      Patients Stated Pain Goal: 4 (34/91/79 1505)  Complications: No complications documented.

## 2020-06-04 NOTE — Anesthesia Preprocedure Evaluation (Signed)
Anesthesia Evaluation  Patient identified by MRN, date of birth, ID band Patient awake    Reviewed: Allergy & Precautions, NPO status , Patient's Chart, lab work & pertinent test results  History of Anesthesia Complications (+) PONV and history of anesthetic complications  Airway Mallampati: II  TM Distance: >3 FB Neck ROM: Full    Dental  (+) Dental Advisory Given, Chipped, Poor Dentition, Missing Crowns, bridge:   Pulmonary former smoker,    Pulmonary exam normal breath sounds clear to auscultation       Cardiovascular Exercise Tolerance: Good hypertension, Pt. on medications Normal cardiovascular exam+ dysrhythmias  Rhythm:Regular Rate:Normal     Neuro/Psych Seizures -, Well Controlled,  PSYCHIATRIC DISORDERS Anxiety Depression Bipolar Disorder Aneurysm repair - 1998 TIACVA    GI/Hepatic GERD  Medicated,(+) Cirrhosis  (resolvede after quit drinking alcohol)    substance abuse (quit alcohol in 2013)  alcohol use,   Endo/Other    Renal/GU Renal InsufficiencyRenal disease     Musculoskeletal  (+) Arthritis  (sjogren's syndrome), Osteoarthritis,    Abdominal   Peds  Hematology  (+) anemia ,   Anesthesia Other Findings   Reproductive/Obstetrics                             Anesthesia Physical Anesthesia Plan  ASA: III  Anesthesia Plan: General   Post-op Pain Management:    Induction: Intravenous  PONV Risk Score and Plan:   Airway Management Planned: Nasal Cannula and Natural Airway  Additional Equipment:   Intra-op Plan:   Post-operative Plan:   Informed Consent: I have reviewed the patients History and Physical, chart, labs and discussed the procedure including the risks, benefits and alternatives for the proposed anesthesia with the patient or authorized representative who has indicated his/her understanding and acceptance.     Dental advisory given  Plan Discussed  with: CRNA and Surgeon  Anesthesia Plan Comments:         Anesthesia Quick Evaluation

## 2020-06-05 LAB — SURGICAL PATHOLOGY

## 2020-06-07 ENCOUNTER — Encounter (HOSPITAL_COMMUNITY): Payer: Self-pay | Admitting: Internal Medicine

## 2020-06-21 ENCOUNTER — Other Ambulatory Visit (INDEPENDENT_AMBULATORY_CARE_PROVIDER_SITE_OTHER): Payer: Medicare HMO | Admitting: *Deleted

## 2020-06-21 ENCOUNTER — Other Ambulatory Visit: Payer: Self-pay

## 2020-06-21 DIAGNOSIS — R300 Dysuria: Secondary | ICD-10-CM | POA: Diagnosis not present

## 2020-06-21 LAB — POCT URINALYSIS DIPSTICK
Glucose, UA: NEGATIVE
Ketones, UA: NEGATIVE
Nitrite, UA: NEGATIVE
Protein, UA: NEGATIVE

## 2020-06-21 MED ORDER — SULFAMETHOXAZOLE-TRIMETHOPRIM 800-160 MG PO TABS: 1.0000 | ORAL_TABLET | Freq: Two times a day (BID) | ORAL | 0 refills | Status: DC

## 2020-06-21 NOTE — Progress Notes (Signed)
   NURSE VISIT- UTI SYMPTOMS   SUBJECTIVE:  Deanna Thompson is a 65 y.o. No obstetric history on file. female here for UTI symptoms. She is a GYN patient. She reports burning with urination.  OBJECTIVE:  There were no vitals taken for this visit.  Appears well, in no apparent distress  Results for orders placed or performed in visit on 06/21/20 (from the past 24 hour(s))  POCT Urinalysis Dipstick   Collection Time: 06/21/20 12:20 PM  Result Value Ref Range   Color, UA     Clarity, UA     Glucose, UA Negative Negative   Bilirubin, UA     Ketones, UA neg    Spec Grav, UA     Blood, UA trace    pH, UA     Protein, UA Negative Negative   Urobilinogen, UA     Nitrite, UA neg    Leukocytes, UA Moderate (2+) (A) Negative   Appearance     Odor      ASSESSMENT: GYN patient with UTI symptoms and negative nitrites  PLAN: Discussed with Derrek Monaco, AGNP   Rx sent by provider today: Yes Urine culture sent Call or return to clinic prn if these symptoms worsen or fail to improve as anticipated. Follow-up: as needed   Levy Pupa  06/21/2020 12:27 PM

## 2020-06-21 NOTE — Addendum Note (Signed)
Addended by: Derrek Monaco A on: 06/21/2020 01:35 PM   Modules accepted: Orders

## 2020-06-21 NOTE — Progress Notes (Signed)
Chart reviewed for nurse visit. Agree with plan of care. Will rx septra ds  Estill Dooms, NP 06/21/2020 1:32 PM

## 2020-06-22 LAB — URINALYSIS, ROUTINE W REFLEX MICROSCOPIC
Bilirubin, UA: NEGATIVE
Glucose, UA: NEGATIVE
Ketones, UA: NEGATIVE
Nitrite, UA: NEGATIVE
Protein,UA: NEGATIVE
RBC, UA: NEGATIVE
Specific Gravity, UA: 1.016 (ref 1.005–1.030)
Urobilinogen, Ur: 0.2 mg/dL (ref 0.2–1.0)
pH, UA: 6.5 (ref 5.0–7.5)

## 2020-06-22 LAB — MICROSCOPIC EXAMINATION
Casts: NONE SEEN /lpf
Epithelial Cells (non renal): NONE SEEN /hpf (ref 0–10)
WBC, UA: >30 /hpf — AB (ref 0–5)

## 2020-06-25 ENCOUNTER — Other Ambulatory Visit: Payer: Self-pay | Admitting: Family Medicine

## 2020-06-25 ENCOUNTER — Ambulatory Visit: Payer: Commercial Managed Care - PPO | Admitting: Nurse Practitioner

## 2020-06-25 DIAGNOSIS — I10 Essential (primary) hypertension: Secondary | ICD-10-CM

## 2020-06-28 LAB — URINE CULTURE

## 2020-07-08 ENCOUNTER — Encounter: Payer: Self-pay | Admitting: Internal Medicine

## 2020-07-28 IMAGING — MG DIGITAL SCREENING BILAT W/ TOMO W/ CAD
8 series · 8 of 24 positions shown · non-contrast
Comparison: Previous exam(s).

ACR Breast Density Category a: The breast tissue is almost entirely
fatty.

CLINICAL DATA: Screening.

EXAM:
DIGITAL SCREENING BILATERAL MAMMOGRAM WITH TOMO AND CAD

[R CC synth-2D]
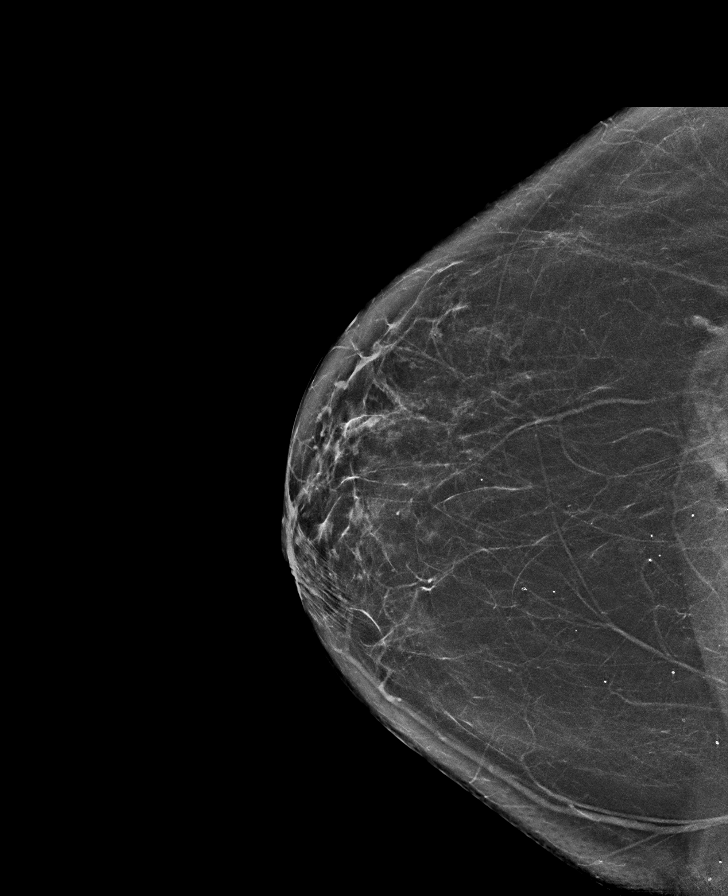

[L CC synth-2D]
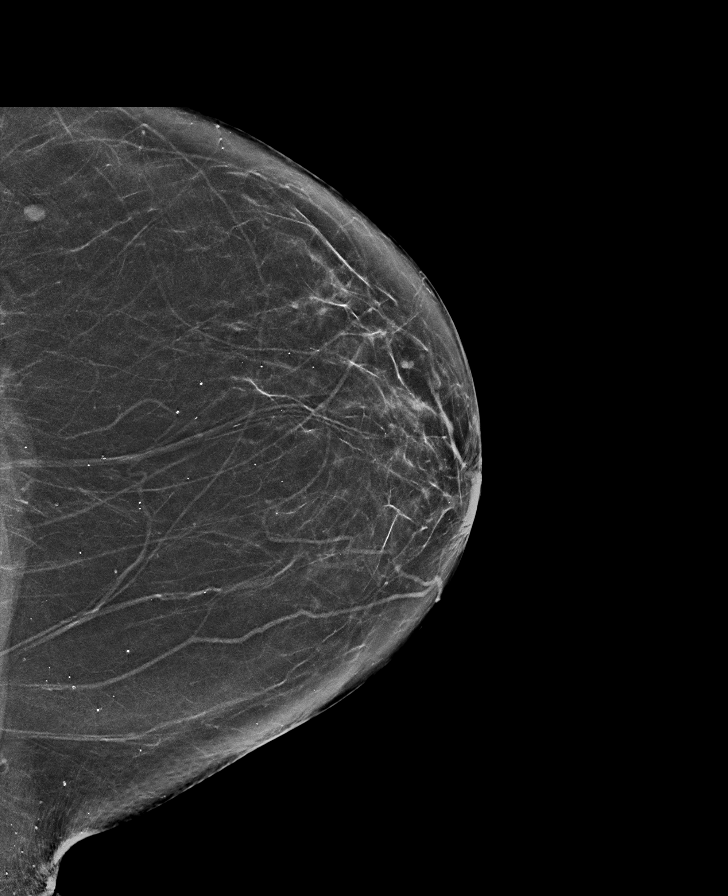

[R MLO synth-2D]
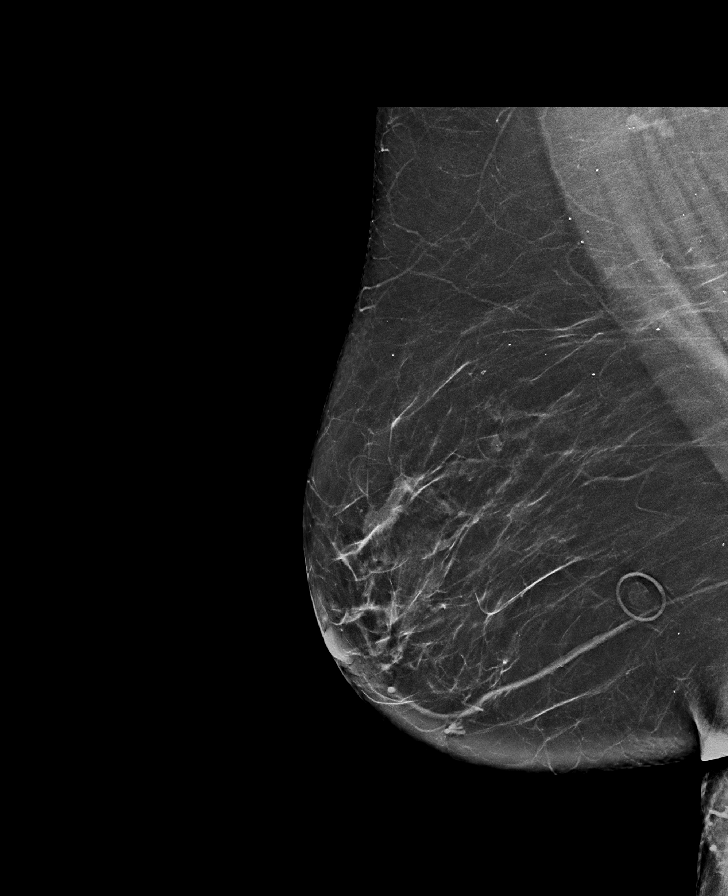

[L MLO synth-2D]
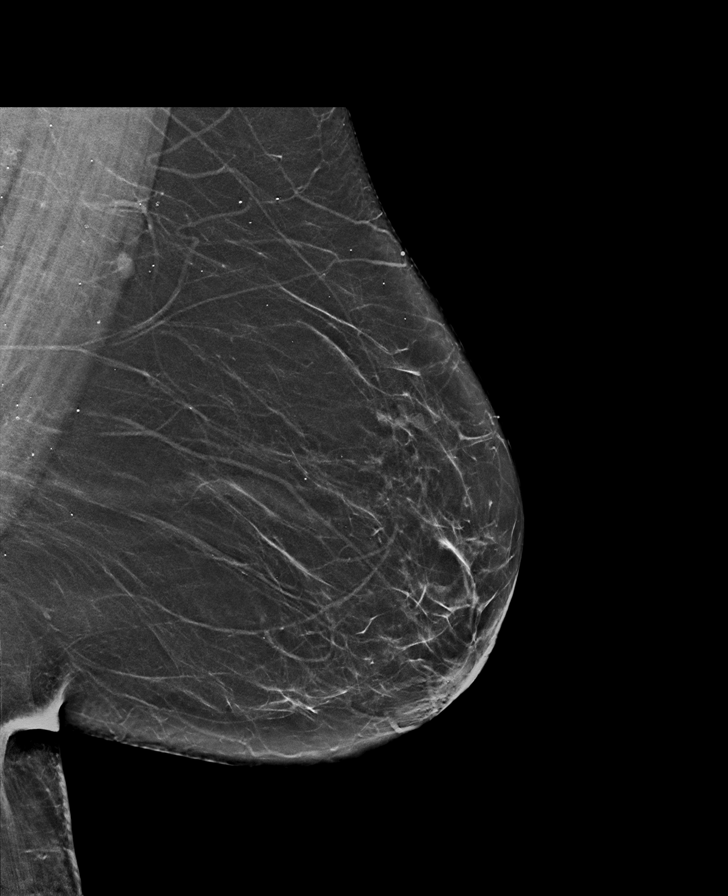

[L CC tomo · tomo slice 33/65.0]
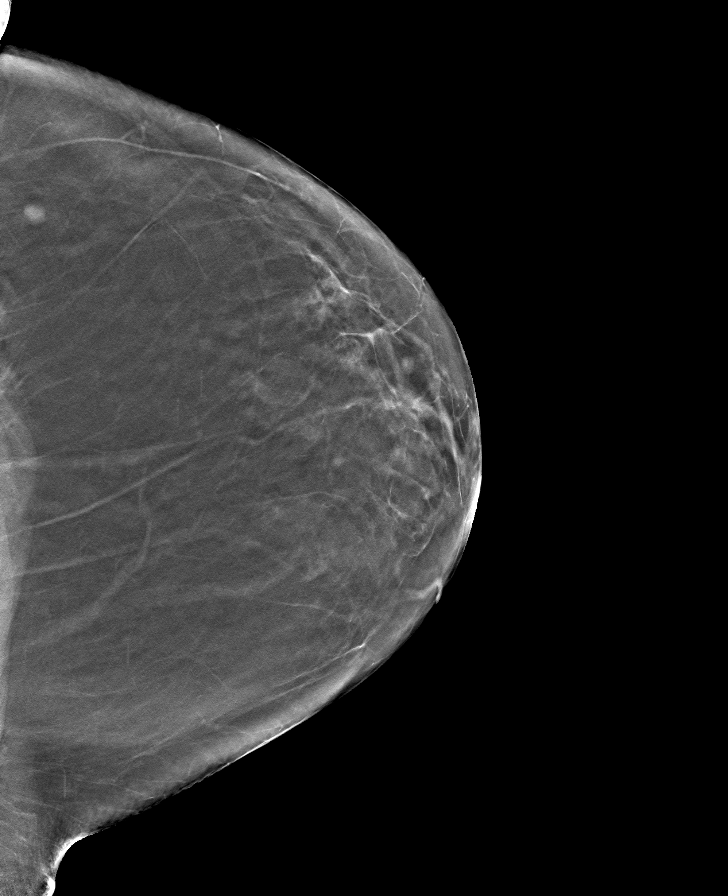

[L MLO tomo · tomo slice 33/66.0]
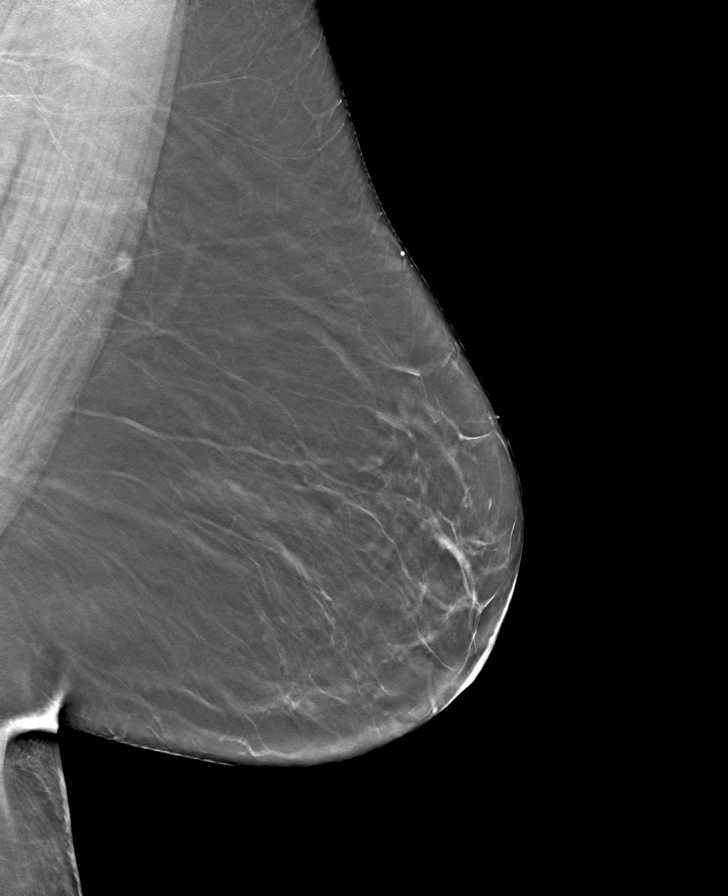

[R CC tomo · tomo slice 35/68.0]
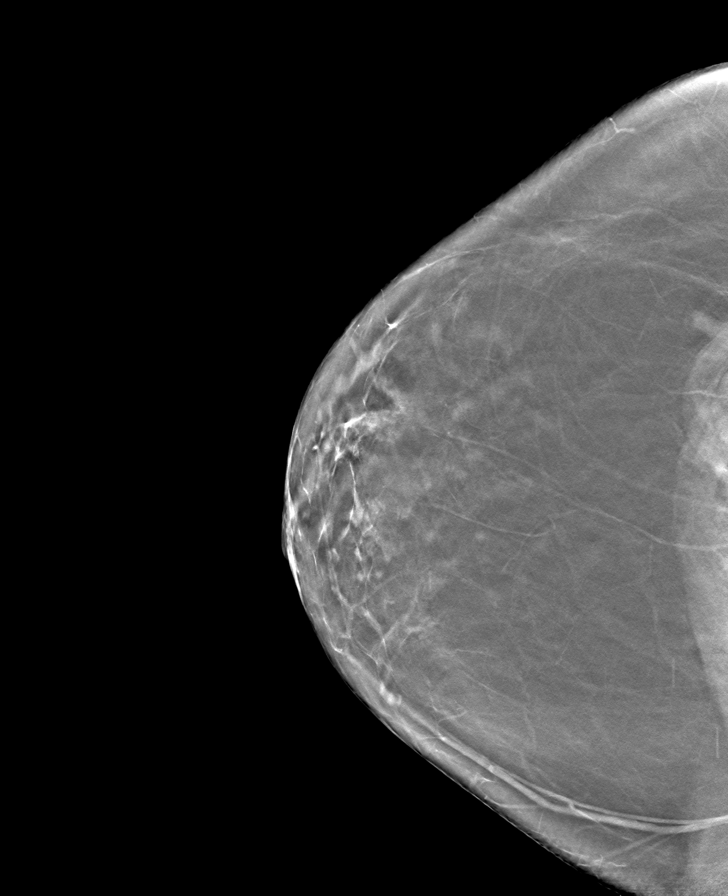

[R MLO tomo · tomo slice 37/72.0]
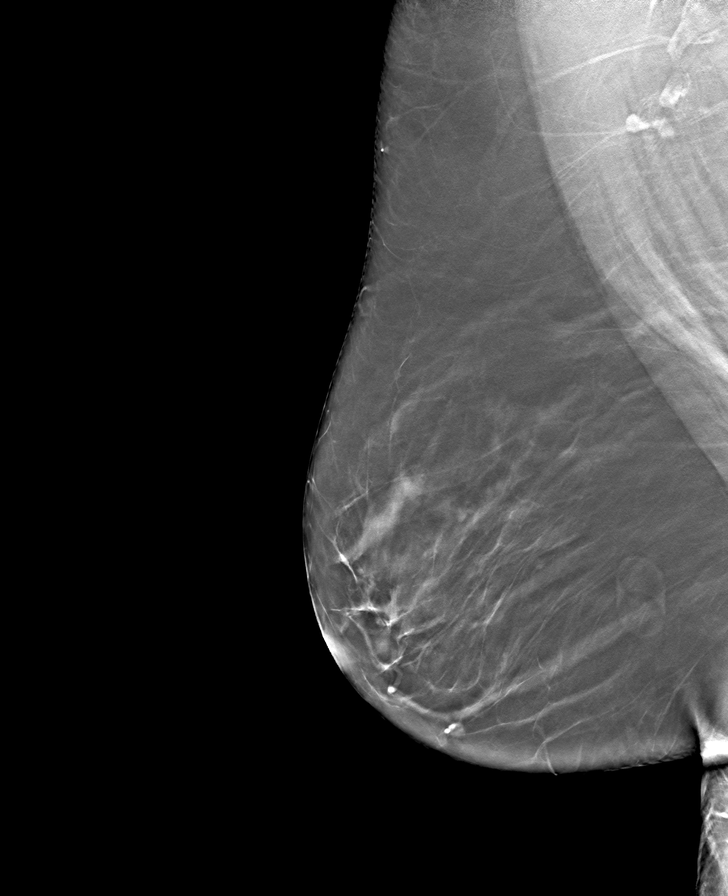

[8 of 24 positions shown; findings below may reference images not displayed]

FINDINGS: There are no findings suspicious for malignancy. Images were
processed with CAD.
IMPRESSION: No mammographic evidence of malignancy. A result letter of this
screening mammogram will be mailed directly to the patient.

RECOMMENDATION:
Screening mammogram in one year. (Code:[id number]-[id number])

BI-RADS CATEGORY  1: Negative.

## 2020-08-07 ENCOUNTER — Ambulatory Visit: Payer: Commercial Managed Care - PPO | Admitting: Nurse Practitioner

## 2020-08-19 ENCOUNTER — Other Ambulatory Visit: Payer: Self-pay | Admitting: Gastroenterology

## 2020-08-21 ENCOUNTER — Ambulatory Visit: Payer: Medicare HMO | Admitting: Internal Medicine

## 2020-08-28 ENCOUNTER — Ambulatory Visit (INDEPENDENT_AMBULATORY_CARE_PROVIDER_SITE_OTHER): Payer: Medicare HMO

## 2020-08-28 ENCOUNTER — Ambulatory Visit
Admission: RE | Admit: 2020-08-28 | Discharge: 2020-08-28 | Disposition: A | Discharge status: Home / Self Care | Payer: Medicare HMO | Source: Ambulatory Visit | Attending: Family Medicine | Admitting: Family Medicine

## 2020-08-28 ENCOUNTER — Other Ambulatory Visit: Payer: Self-pay

## 2020-08-28 VITALS — BP 130/78 | HR 87 | Temp 98.6°F | Resp 20

## 2020-08-28 DIAGNOSIS — W19XXXA Unspecified fall, initial encounter: Secondary | ICD-10-CM

## 2020-08-28 DIAGNOSIS — R0781 Pleurodynia: Secondary | ICD-10-CM

## 2020-08-28 DIAGNOSIS — R0782 Intercostal pain: Secondary | ICD-10-CM

## 2020-08-28 NOTE — Discharge Instructions (Signed)
Xray shows that it cannot rule out a rib fracture.  I do not see an obvious fracture  May take ibuprofen, tylenol as needed  May use ice to heat to the area  May use topical rub to the area as well  Follow up with this office or with primary care if symptoms are persisting.  Follow up in the ER for high fever, trouble swallowing, trouble breathing, other concerning symptoms.

## 2020-08-28 NOTE — ED Triage Notes (Signed)
Pt had fall on Sunday and is having left side rib pain

## 2020-08-28 NOTE — ED Provider Notes (Signed)
[facility]    CSN: [id number] Arrival date & time: 08/28/20  1244      History   Chief Complaint Chief Complaint  Patient presents with  . Fall    HPI Deanna Thompson is a 65 y.o. female.   Reports that she fell and hit her left rib 3 days ago. Has not attempted OTC treatment. Denies previous symptoms. Denies headache, cough, SOB, bruising to the ribs, back pain, chest pain, abdominal pain, nausea, vomiting, diarrhea, rash, fever, other symptoms.  ROS per HPI  The history is provided by the patient.  Fall    Past Medical History:  Diagnosis Date  . Abdominal hernia    repaired  . Abscess of pulp of tooth 12/12/14   left side  . Anxiety   . Arthritis    back  and down flank  right side more than left  . B12 deficiency 06/12/2015  . Bipolar disorder ([place])   . Brain aneurysm    METAL COIL PREVENTS MRI IMAGING and CLIP  . Cirrhosis ([employer])    Presumably alcoholic cirrhosis  . Complication of anesthesia   . Depression   . ETOH abuse    Quit in 07/2011   . Fatigue    due to Sjogren's disease  . Flat foot (pes planus) (acquired), left foot   . Folate deficiency 06/30/2016  . GERD (gastroesophageal reflux disease)   . HTN (hypertension)    no longer medicated, dated on 04/25/12  . IBS (irritable bowel syndrome)   . Iron deficiency anemia due to chronic blood loss   . Myofascial pain   . Panic attack   . PONV (postoperative nausea and vomiting)   . Psoriatic arthritis ([place])   . Right inguinal hernia 08/16/2012  . Seizures ([place]) 1997   1 seizure from coil insertion but no more seizures and no meds for seizures  . Sjogren's disease ([place])   . Stroke ([place]) 2019  . [place] HEAD 02/03/2007   Qualifier: Diagnosis of  By: Aline Brochure MD, Dorothyann Peng    . TIA (transient ischemic attack)   . 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([place])   . Vertigo     Patient Active Problem List   Diagnosis Date Noted  . Dysphagia 02/08/2020  . IBS (irritable bowel  syndrome)   . Symptomatic anemia 03/15/2019  . GI bleeding 03/14/2019  . Gastric AVM   . Postoperative anemia due to acute blood loss 10/29/2018  . Gallstone pancreatitis   . Elevated liver enzymes   . Non-intractable vomiting   . Abstinence from alcohol   . Cholecystitis, acute 10/20/2018  . Benign paroxysmal positional vertigo   . 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 Essential hypertension 07/31/2017  . GERD (gastroesophageal reflux disease) 07/31/2017  . Alcohol abuse 07/31/2017  . Brain aneurysm 07/31/2017  . CKD (chronic kidney disease), stage III ([place]) 07/31/2017  . LBBB (left bundle branch block) 07/31/2017  . Dizziness 07/31/2017  . Folate deficiency 06/30/2016  . Constipation 04/01/2016  . 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 Hemorrhoids, internal, with bleeding 01/08/2016  . B12 deficiency 06/12/2015  . Psoriatic arthritis ([place]) 04/15/2015  . Nutritional anemia 09/19/2014  . Cirrhosis of liver ([place]) 09/19/2014  . Iron deficiency anemia due to chronic blood loss   . Insomnia 03/17/2014  . Depression 02/18/2014  . Labial swelling 08/23/2012  . Rash and nonspecific skin eruption 06/30/2012  . Colon polyps 11/18/2011  . Cirrhosis ([place]) 08/19/2011    Past Surgical History:  Procedure Laterality Date  . AGILE  CAPSULE N/A 06/21/2014   Procedure: AGILE CAPSULE;  Surgeon: Danie Binder, MD;  [facility];  Service: Endoscopy;  Laterality: N/A;  0700  . BIOPSY  10/13/2011   SLF: mild gastritis/duodenal polypoid lesion   . BIOPSY  06/04/2020   Procedure: BIOPSY;  Surgeon: Eloise Harman, DO;  [facility];  Service: Endoscopy;;  . CEREBRAL ANEURYSM REPAIR  1997  . CHOLECYSTECTOMY N/A 10/28/2018   Procedure: LAPAROSCOPIC CHOLECYSTECTOMY WITH INTRAOPERATIVE CHOLANGIOGRAM;  Surgeon: Virl Cagey, MD;  Location: [facility];  Service: General;  Laterality: N/A;  . COLONOSCOPY  10/13/2011   MOQ:HUTMLYYT hemorrhoids/varices rectal/lesion in ascending  colon(HYPERPLASTIC POLYP)  . COLONOSCOPY N/A 01/17/2013   SLF: 1. Internal hemorrhoids 2. Ascending colon polyps 3.  Rectal varices.   . COLONOSCOPY N/A 08/10/2016   Dr. Oneida Alar: redundant left colon. external and internal hemorhroids. 5-10 year surveillance  . ESOPHAGOGASTRODUODENOSCOPY N/A 06/29/2014   SLF: 1. anemia due to polypoid lesions in teh antrum, and duodenal polyps 2. single duodenum AVM  . ESOPHAGOGASTRODUODENOSCOPY (EGD) WITH PROPOFOL N/A 03/07/2019   Procedure: ESOPHAGOGASTRODUODENOSCOPY (EGD) WITH PROPOFOL;  Surgeon: Danie Binder, MD;  [facility];  Service: Endoscopy;  Laterality: N/A;  1:00pm  . ESOPHAGOGASTRODUODENOSCOPY (EGD) WITH PROPOFOL N/A 03/16/2019   Procedure: ESOPHAGOGASTRODUODENOSCOPY (EGD) WITH PROPOFOL;  Surgeon: Danie Binder, MD;  [facility];  Service: Endoscopy;  Laterality: N/A;  . ESOPHAGOGASTRODUODENOSCOPY (EGD) WITH PROPOFOL N/A 06/04/2020   Procedure: ESOPHAGOGASTRODUODENOSCOPY (EGD) WITH PROPOFOL;  Surgeon: Eloise Harman, DO;  [facility];  Service: Endoscopy;  Laterality: N/A;  am  . FLEXIBLE SIGMOIDOSCOPY N/A 01/21/2016   Procedure: FLEXIBLE SIGMOIDOSCOPY;  Surgeon: Danie Binder, MD;  [facility];  Service: Endoscopy;  Laterality: N/A;  245  . GIVENS CAPSULE STUDY N/A 06/29/2014   Procedure: GIVENS CAPSULE STUDY;  Surgeon: Danie Binder, MD;  [facility];  Service: Endoscopy;  Laterality: N/A;  . GIVENS CAPSULE STUDY N/A 07/13/2014   Procedure: GIVENS CAPSULE STUDY;  Surgeon: Danie Binder, MD;  [facility];  Service: Endoscopy;  Laterality: N/A;  700  . HEMORRHOID BANDING N/A 01/21/2016   Procedure: HEMORRHOID BANDING;  Surgeon: Danie Binder, MD;  [facility];  Service: Endoscopy;  Laterality: N/A;  . HEMOSTASIS CLIP PLACEMENT  03/16/2019   Procedure: HEMOSTASIS CLIP PLACEMENT;  Surgeon: Danie Binder, MD;  [facility];  Service: Endoscopy;;  x 4  .  HEMOSTASIS CONTROL  03/16/2019   Procedure: HEMOSTASIS CONTROL;  Surgeon: Danie Binder, MD;  [facility];  Service: Endoscopy;;  injection with epinephine 0.1mg /ml via scope and bipolar cautery with gold probe  . INGUINAL HERNIA REPAIR Right 08/28/2013   Procedure: RIGHT NGUINAL HERNIORRHAPHY WITH MESH;  Surgeon: Jamesetta So, MD;  Location: [facility];  Service: General;  Laterality: Right;  . INSERTION OF MESH N/A 06/09/2013   Procedure: INSERTION OF MESH;  Surgeon: Jamesetta So, MD;  [facility];  Service: General;  Laterality: N/A;  . INSERTION OF MESH Right 08/28/2013   Procedure: INSERTION OF MESH;  Surgeon: Jamesetta So, MD;  [facility];  Service: General;  Laterality: Right;  . PARACENTESIS  Feb 2014   10.7 liters  . TOOTH EXTRACTION Left 12/12/14  . UMBILICAL HERNIA REPAIR N/A 06/09/2013   Procedure: UMBILICAL HERNIORRHAPHY WITH MESH;  Surgeon: Jamesetta So, MD;  Location: [facility];  Service: General;  Laterality:  N/A;    OB History    Gravida      Para      Term      Preterm      AB      Living  0     SAB      IAB      Ectopic      Multiple      Live Births               Home Medications    Prior to Admission medications   Medication Sig Start Date End Date Taking? Authorizing Provider  atorvastatin (LIPITOR) 40 MG tablet TAKE (1) TABLET BY MOUTH ONCE DAILY. 05/29/20   Lovena Le, Malena M, DO  bumetanide (BUMEX) 1 MG tablet TAKE (1) TABLET BY MOUTH TWICE DAILY. 08/20/20   Annitta Needs, NP  buPROPion (WELLBUTRIN SR) 100 MG 12 hr tablet TAKE (1) TABLET BY MOUTH TWICE DAILY. 05/29/20   Lovena Le, Malena M, DO  Camphor-Menthol-Methyl Sal (SALONPAS) 3.04-04-08 % PTCH Place 1 patch onto the skin daily as needed (pain.).     [provider]  cholecalciferol (VITAMIN D3) 25 MCG (1000 UT) tablet Take 2 tablets (2,000 Units total) by mouth daily. Patient taking differently: Take 1,000 Units by mouth every morning. 12/14/18   Fields, Marga Melnick, MD   DULoxetine (CYMBALTA) 20 MG capsule Take 40 mg by mouth daily.  06/10/19   [provider]  famotidine (PEPCID) 20 MG tablet Take 1 tablet (20 mg total) by mouth 2 (two) times daily before a meal. 10/17/19   Jodi Mourning, Tivis Ringer, PA-C  fluticasone (FLONASE) 50 MCG/ACT nasal spray Place 1 spray into both nostrils See admin instructions. Instill 1 spray into each nostril every morning, may also use later in the day as needed for sinus congestion    [provider]  hydrocortisone 2.5 % ointment Apply topically 2 (two) times daily. Topically to eyelid rash for 1 week. 11/30/19   Elvia Collum M, DO  lamoTRIgine (LAMICTAL) 150 MG tablet Take 150 mg by mouth daily. 10/18/19   [provider]  Lifitegrast 5 % SOLN Place 1 drop into both eyes 2 (two) times daily.    [provider]  LINZESS 145 MCG CAPS capsule TAKE (1) CAPSULE BY MOUTH EVERY DAY BEFORE BREAKFAST. 05/27/20   Erenest Rasher, PA-C  loratadine (CLARITIN) 10 MG tablet Take 10 mg by mouth daily.    [provider]  losartan (COZAAR) 25 MG tablet TAKE (1) TABLET BY MOUTH ONCE DAILY. 06/27/20   Erven Colla, DO  Polyvinyl Alcohol-Povidone (REFRESH OP) Place 1 drop into both eyes 3 (three) times daily as needed (dry eyes).    [provider]  Simethicone (GAS-X PO) Take 1 tablet by mouth 2 (two) times daily as needed (bloating/flatulence).    [provider]  sodium chloride (OCEAN) 0.65 % nasal spray Place 1 spray into the nose 2 (two) times daily as needed for congestion.     [provider]  spironolactone (ALDACTONE) 100 MG tablet Take 0.5 tablets (50 mg total) by mouth 2 (two) times daily. 05/29/20   Elvia Collum M, DO  sulfamethoxazole-trimethoprim (BACTRIM DS) 800-160 MG tablet Take 1 tablet by mouth 2 (two) times daily. Take 1 bid 06/21/20   Derrek Monaco A, NP  Tofacitinib Citrate ER (XELJANZ XR) 11 MG TB24 Take 11 mg by mouth daily.     [provider]     Family History Family History  Problem Relation Age of Onset  . Cirrhosis Mother        nash  . COPD Father   . Heart disease Father   . Asthma Other   . Heart disease Other   . Colon cancer Neg Hx   . Inflammatory bowel disease Neg Hx     Social History Social History   Tobacco Use  . Smoking status: Former Smoker    Packs/day: 2.00    Years: 12.00    Pack years: 24.00    Types: Cigarettes    Quit date: 03/31/1995    Years since quitting: 25.4  . Smokeless tobacco: Never Used  . Tobacco comment: quit in 1997  Vaping Use  . Vaping Use: Never used  Substance Use Topics  . Alcohol use: No    Comment: Bottle of wine a day for years, but quit in 07/2011  . Drug use: No     Allergies   Omeprazole, Orencia [abatacept], Other, Phenytoin, Keflex [cephalexin], Lidocaine viscous hcl, and Phenergan [promethazine hcl]   Review of Systems Review of Systems   Physical Exam Triage Vital Signs ED Triage Vitals  Enc Vitals Group     BP 08/28/20 1307 130/78     Pulse Rate 08/28/20 1307 87     Resp 08/28/20 1307 20     Temp 08/28/20 1307 98.6 F (37 C)     Temp src --      SpO2 08/28/20 1307 97 %     Weight --      Height --      Head Circumference --      Peak Flow --      Pain Score 08/28/20 1256 7     Pain Loc --      Pain Edu? --      Excl. in [place]? --    No data found.  Updated Vital Signs BP 130/78   Pulse 87   Temp 98.6 F (37 C)   Resp 20   SpO2 97%   Visual Acuity Right Eye Distance:   Left Eye Distance:   Bilateral Distance:    Right Eye Near:   Left Eye Near:    Bilateral Near:     Physical Exam Vitals and nursing note reviewed.  Constitutional:      General: She is not in acute distress.    Appearance: Normal appearance. She is well-developed. She is not ill-appearing.  HENT:     Head: Normocephalic and atraumatic.     Nose: Nose normal.     Mouth/Throat:     Mouth: Mucous membranes are moist.     Pharynx: Oropharynx is clear.   Eyes:     Extraocular Movements: Extraocular movements intact.     Conjunctiva/sclera: Conjunctivae normal.     Pupils: Pupils are equal, round, and reactive to light.  Cardiovascular:     Rate and Rhythm: Normal rate and regular rhythm.     Heart sounds: No murmur heard.   Pulmonary:     Effort: Pulmonary effort is normal. No respiratory distress.     Breath sounds: Normal breath sounds.  Abdominal:     General: There is no distension.     Palpations: Abdomen is soft. There is no mass.     Tenderness: There is no abdominal tenderness. There is no right CVA tenderness, left CVA tenderness, guarding or rebound.     Hernia: No hernia is present.  Musculoskeletal:        General: Swelling (left  lateral ankle, non tender) and tenderness (left ribs just below breast) present.     Cervical back: Normal range of motion and neck supple.  Skin:    General: Skin is warm and dry.     Capillary Refill: Capillary refill takes less than 2 seconds.     Findings: Bruising (bilateral knees and lateral left ankle) present.  Neurological:     General: No focal deficit present.     Mental Status: She is alert and oriented to person, place, and time.  Psychiatric:        Mood and Affect: Mood normal.        Behavior: Behavior normal.        Thought Content: Thought content normal.      UC Treatments / Results  Labs (all labs ordered are listed, but only abnormal results are displayed) Labs Reviewed - No data to display  EKG   Radiology DG Ribs Unilateral W/Chest Left  Result Date: 08/28/2020 CLINICAL DATA:  Fall.  Left-sided rib pain. EXAM: LEFT RIBS AND CHEST - 3+ VIEW COMPARISON:  No recent. FINDINGS: Left lower ribs are difficult to evaluate due to technique. Nondisplaced left posterolateral tenth rib fracture cannot be excluded. Degenerative change thoracic spine. No pneumothorax. Surgical clips right upper quadrant. IMPRESSION: Left lower ribs are difficult to evaluate due to  technique. Nondisplaced left posterolateral tenth rib fracture cannot be excluded. No pneumothorax. Electronically Signed   By: Marcello Moores  Register   On: 08/28/2020 13:40    Procedures Procedures (including critical care time)  Medications Ordered in UC Medications - No data to display  Initial Impression / Assessment and Plan / UC Course  I have reviewed the triage vital signs and the nursing notes.  Pertinent labs & imaging results that were available during my care of the patient were reviewed by me and considered in my medical decision making (see chart for details).    Fall, Initial Encounter Left rib pain  Xray shows that it cannot rule out a 10th rib fracture May take tylenol and ibuprofen as needed for pain May use topical rub to the area May use ice or heat as needed for comfort Follow up with this office or with primary care if symptoms are persisting.  Follow up in the ER for high fever, trouble swallowing, trouble breathing, other concerning symptoms.    Final Clinical Impressions(s) / UC Diagnoses   Final diagnoses:  Fall, initial encounter  Rib pain on left side     Discharge Instructions     Xray shows that it cannot rule out a rib fracture.  I do not see an obvious fracture  May take ibuprofen, tylenol as needed  May use ice to heat to the area  May use topical rub to the area as well  Follow up with this office or with primary care if symptoms are persisting.  Follow up in the ER for high fever, trouble swallowing, trouble breathing, other concerning symptoms.     ED Prescriptions    None     PDMP not reviewed this encounter.   Faustino Congress, NP 08/28/20 [phone]

## 2020-09-04 ENCOUNTER — Ambulatory Visit: Payer: Medicare HMO | Admitting: Gastroenterology

## 2020-09-04 NOTE — Progress Notes (Deleted)
Referring Provider: Erven Colla, DO Primary Care Physician:  Erven Colla, DO  No chief complaint on file.   HPI:   [name] presenting today for 6 month follow up r/t history of Cirrhosis.  RUQ [place] 03/13/2020 showed known cirrhotic liver (since 2013), No focal lesion identified. Heterogenous, mildly echogenic parenchyma with nodular hepatic contour. Portal vein patent with normal direction of blood flow towards the liver.  Most recent Labs 12/29/19 revealed stable LFTs and Hemoglobin.   Meld score of 10 and Child Pugh A in December 2021.  Past Medical History:  Diagnosis Date  . Abdominal hernia    repaired  . Abscess of pulp of tooth 12/12/14   left side  . Anxiety   . Arthritis    back  and down flank  right side more than left  . B12 deficiency 06/12/2015  . Bipolar disorder ([place])   . Brain aneurysm    METAL COIL PREVENTS MRI IMAGING and CLIP  . Cirrhosis ([place])    Presumably alcoholic cirrhosis  . Complication of anesthesia   . Depression   . ETOH abuse    Quit in 07/2011   . Fatigue    due to Sjogren's disease  . Flat foot (pes planus) (acquired), left foot   . Folate deficiency 06/30/2016  . GERD (gastroesophageal reflux disease)   . HTN (hypertension)    no longer medicated, dated on 04/25/12  . IBS (irritable bowel syndrome)   . Iron deficiency anemia due to chronic blood loss   . Myofascial pain   . Panic attack   . PONV (postoperative nausea and vomiting)   . Psoriatic arthritis ([place])   . Right inguinal hernia 08/16/2012  . Seizures ([place]) 1997   1 seizure from coil insertion but no more seizures and no meds for seizures  . Sjogren's disease ([place])   . Stroke ([place]) 2019  . [place] HEAD 02/03/2007   Qualifier: Diagnosis of  By: Aline Brochure MD, Dorothyann Peng    . TIA (transient ischemic attack)   . 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([place])   . Vertigo     Past Surgical History:  Procedure Laterality Date  .  AGILE CAPSULE N/A 06/21/2014   Procedure: AGILE CAPSULE;  Surgeon: Danie Binder, MD;  [facility];  Service: Endoscopy;  Laterality: N/A;  0700  . BIOPSY  10/13/2011   SLF: mild gastritis/duodenal polypoid lesion   . BIOPSY  06/04/2020   Procedure: BIOPSY;  Surgeon: Eloise Harman, DO;  [facility];  Service: Endoscopy;;  . CEREBRAL ANEURYSM REPAIR  1997  . CHOLECYSTECTOMY N/A 10/28/2018   Procedure: LAPAROSCOPIC CHOLECYSTECTOMY WITH INTRAOPERATIVE CHOLANGIOGRAM;  Surgeon: Virl Cagey, MD;  Location: [facility];  Service: General;  Laterality: N/A;  . COLONOSCOPY  10/13/2011   PRF:[id number] hemorrhoids/varices rectal/lesion in ascending colon(HYPERPLASTIC POLYP)  . COLONOSCOPY N/A 01/17/2013   SLF: 1. Internal hemorrhoids 2. Ascending colon polyps 3.  Rectal varices.   . COLONOSCOPY N/A 08/10/2016   Dr. Oneida Alar: redundant left colon. external and internal hemorhroids. 5-10 year surveillance  . ESOPHAGOGASTRODUODENOSCOPY N/A 06/29/2014   SLF: 1. anemia due to polypoid lesions in teh antrum, and duodenal polyps 2. single duodenum AVM  . ESOPHAGOGASTRODUODENOSCOPY (EGD) WITH PROPOFOL N/A 03/07/2019   Procedure: ESOPHAGOGASTRODUODENOSCOPY (EGD) WITH PROPOFOL;  Surgeon: Danie Binder, MD;  [facility];  Service: Endoscopy;  Laterality: N/A;  1:00pm  . ESOPHAGOGASTRODUODENOSCOPY (EGD) WITH PROPOFOL N/A 03/16/2019  Procedure: ESOPHAGOGASTRODUODENOSCOPY (EGD) WITH PROPOFOL;  Surgeon: Danie Binder, MD;  [facility];  Service: Endoscopy;  Laterality: N/A;  . ESOPHAGOGASTRODUODENOSCOPY (EGD) WITH PROPOFOL N/A 06/04/2020   Procedure: ESOPHAGOGASTRODUODENOSCOPY (EGD) WITH PROPOFOL;  Surgeon: Eloise Harman, DO;  [facility];  Service: Endoscopy;  Laterality: N/A;  am  . FLEXIBLE SIGMOIDOSCOPY N/A 01/21/2016   Procedure: FLEXIBLE SIGMOIDOSCOPY;  Surgeon: Danie Binder, MD;  [facility];  Service: Endoscopy;  Laterality: N/A;  245   . GIVENS CAPSULE STUDY N/A 06/29/2014   Procedure: GIVENS CAPSULE STUDY;  Surgeon: Danie Binder, MD;  [facility];  Service: Endoscopy;  Laterality: N/A;  . GIVENS CAPSULE STUDY N/A 07/13/2014   Procedure: GIVENS CAPSULE STUDY;  Surgeon: Danie Binder, MD;  [facility];  Service: Endoscopy;  Laterality: N/A;  700  . HEMORRHOID BANDING N/A 01/21/2016   Procedure: HEMORRHOID BANDING;  Surgeon: Danie Binder, MD;  [facility];  Service: Endoscopy;  Laterality: N/A;  . HEMOSTASIS CLIP PLACEMENT  03/16/2019   Procedure: HEMOSTASIS CLIP PLACEMENT;  Surgeon: Danie Binder, MD;  [facility];  Service: Endoscopy;;  x 4  . HEMOSTASIS CONTROL  03/16/2019   Procedure: HEMOSTASIS CONTROL;  Surgeon: Danie Binder, MD;  [facility];  Service: Endoscopy;;  injection with epinephine 0.1mg /ml via scope and bipolar cautery with gold probe  . INGUINAL HERNIA REPAIR Right 08/28/2013   Procedure: RIGHT NGUINAL HERNIORRHAPHY WITH MESH;  Surgeon: Jamesetta So, MD;  [facility];  Service: General;  Laterality: Right;  . INSERTION OF MESH N/A 06/09/2013   Procedure: INSERTION OF MESH;  Surgeon: Jamesetta So, MD;  [facility];  Service: General;  Laterality: N/A;  . INSERTION OF MESH Right 08/28/2013   Procedure: INSERTION OF MESH;  Surgeon: Jamesetta So, MD;  [facility];  Service: General;  Laterality: Right;  . PARACENTESIS  Feb 2014   10.7 liters  . TOOTH EXTRACTION Left 12/12/14  . UMBILICAL HERNIA REPAIR N/A 06/09/2013   Procedure: UMBILICAL HERNIORRHAPHY WITH MESH;  Surgeon: Jamesetta So, MD;  [facility];  Service: General;  Laterality: N/A;    Current Outpatient Medications  Medication Sig Dispense Refill  . atorvastatin (LIPITOR) 40 MG tablet TAKE (1) TABLET BY MOUTH ONCE DAILY. 90 tablet 1  . bumetanide (BUMEX) 1 MG tablet TAKE (1) TABLET BY MOUTH TWICE DAILY. 180 tablet 1  . buPROPion (WELLBUTRIN SR) 100 MG 12 hr  tablet TAKE (1) TABLET BY MOUTH TWICE DAILY. 60 tablet 5  . Camphor-Menthol-Methyl Sal (SALONPAS) 3.04-04-08 % PTCH Place 1 patch onto the skin daily as needed (pain.).     Marland Kitchen cholecalciferol (VITAMIN D3) 25 MCG (1000 UT) tablet Take 2 tablets (2,000 Units total) by mouth daily. (Patient taking differently: Take 1,000 Units by mouth every morning.) 30 tablet 11  . DULoxetine (CYMBALTA) 20 MG capsule Take 40 mg by mouth daily.     . famotidine (PEPCID) 20 MG tablet Take 1 tablet (20 mg total) by mouth 2 (two) times daily before a meal. 60 tablet 5  . fluticasone (FLONASE) 50 MCG/ACT nasal spray Place 1 spray into both nostrils See admin instructions. Instill 1 spray into each nostril every morning, may also use later in the day as needed for sinus congestion    . hydrocortisone 2.5 % ointment Apply topically 2 (two) times daily. Topically to eyelid rash for 1 week. 30 g  1  . lamoTRIgine (LAMICTAL) 150 MG tablet Take 150 mg by mouth daily.    . Lifitegrast 5 % SOLN Place 1 drop into both eyes 2 (two) times daily.    Marland Kitchen LINZESS 145 MCG CAPS capsule TAKE (1) CAPSULE BY MOUTH EVERY DAY BEFORE BREAKFAST. 30 capsule 11  . loratadine (CLARITIN) 10 MG tablet Take 10 mg by mouth daily.    Marland Kitchen losartan (COZAAR) 25 MG tablet TAKE (1) TABLET BY MOUTH ONCE DAILY. 90 tablet 0  . Polyvinyl Alcohol-Povidone (REFRESH OP) Place 1 drop into both eyes 3 (three) times daily as needed (dry eyes).    . Simethicone (GAS-X PO) Take 1 tablet by mouth 2 (two) times daily as needed (bloating/flatulence).    . sodium chloride (OCEAN) 0.65 % nasal spray Place 1 spray into the nose 2 (two) times daily as needed for congestion.     Marland Kitchen spironolactone (ALDACTONE) 100 MG tablet Take 0.5 tablets (50 mg total) by mouth 2 (two) times daily. 90 tablet 1  . sulfamethoxazole-trimethoprim (BACTRIM DS) 800-160 MG tablet Take 1 tablet by mouth 2 (two) times daily. Take 1 bid 14 tablet 0  . Tofacitinib Citrate ER (XELJANZ XR) 11 MG TB24 Take 11 mg by  mouth daily.      No current facility-administered medications for this visit.    Allergies as of 09/04/2020 - Review Complete 08/28/2020  Allergen Reaction Noted  . Omeprazole Shortness Of Breath 08/19/2011  . Orencia [abatacept] Anaphylaxis, Hives, and Rash 10/25/2017  . Other Shortness Of Breath, Rash, and Other (See Comments) 01/09/2013  . Phenytoin Rash   . Keflex [cephalexin] Other (See Comments)   . Lidocaine viscous hcl  03/16/2019  . Phenergan [promethazine hcl] Other (See Comments) 04/08/2012    Family History  Problem Relation Age of Onset  . Cirrhosis Mother        nash  . COPD Father   . Heart disease Father   . Asthma Other   . Heart disease Other   . Colon cancer Neg Hx   . Inflammatory bowel disease Neg Hx     Social History   Socioeconomic History  . Marital status: Married    Spouse name: Kris Mouton  . Number of children: 0  . Years of education: Not on file  . Highest education level: Not on file  Occupational History    Employer: RETIRED  Tobacco Use  . Smoking status: Former Smoker    Packs/day: 2.00    Years: 12.00    Pack years: 24.00    Types: Cigarettes    Quit date: 03/31/1995    Years since quitting: 25.4  . Smokeless tobacco: Never Used  . Tobacco comment: quit in 1997  Vaping Use  . Vaping Use: Never used  Substance and Sexual Activity  . Alcohol use: No    Comment: Bottle of wine a day for years, but quit in 07/2011  . Drug use: No  . Sexual activity: Not Currently    Birth control/protection: Post-menopausal  Other Topics Concern  . Not on file  Social History Narrative   Lives   Caffeine use:    Quit drinking alcohol 3 YEARS ago. No drugs, no IVDU.    Social Determinants of Health   Financial Resource Strain: Not on file  Food Insecurity: Not on file  Transportation Needs: Not on file  Physical Activity: Not on file  Stress: Not on file  Social Connections: Not on file    Review of Systems: Gen: Denies  fever, chills,  anorexia. Denies fatigue, weakness, weight loss.  CV: Denies chest pain, palpitations, syncope, peripheral edema, and claudication. Resp: Denies dyspnea at rest, cough, wheezing, coughing up blood, and pleurisy. GI: Denies vomiting blood, jaundice, and fecal incontinence.   Denies dysphagia or odynophagia. Derm: Denies rash, itching, dry skin Psych: Denies depression, anxiety, memory loss, confusion. No homicidal or suicidal ideation.  Heme: Denies bruising, bleeding, and enlarged lymph nodes.  Physical Exam: There were no vitals taken for this visit. General:   Alert and oriented. No distress noted. Pleasant and cooperative.  Head:  Normocephalic and atraumatic. Eyes:  Conjuctiva clear without scleral icterus. Mouth:  Oral mucosa pink and moist. Good dentition. No lesions. Abdomen:  +BS, soft, non-tender and non-distended. No rebound or guarding. No HSM or masses noted. Msk:  Symmetrical without gross deformities. Normal posture. Extremities:  Without edema. Neurologic:  Alert and  oriented x4 Psych:  Alert and cooperative. Normal mood and affect.  ASSESSMENT: Deanna Thompson is a 65 y.o. female presenting today for 6 month follow up of cirrhosis.    PLAN:  Check Labs: CMP, INR, CBC 6 month RUQ [place]

## 2020-12-18 ENCOUNTER — Telehealth: Payer: Self-pay | Admitting: Family Medicine

## 2020-12-18 DIAGNOSIS — I10 Essential (primary) hypertension: Secondary | ICD-10-CM

## 2020-12-27 ENCOUNTER — Other Ambulatory Visit: Payer: Self-pay | Admitting: Nurse Practitioner

## 2020-12-27 ENCOUNTER — Telehealth: Payer: Self-pay | Admitting: Nurse Practitioner

## 2020-12-27 DIAGNOSIS — I10 Essential (primary) hypertension: Secondary | ICD-10-CM

## 2020-12-27 MED ORDER — LOSARTAN POTASSIUM 25 MG PO TABS: ORAL_TABLET | ORAL | 0 refills | Status: DC

## 2020-12-27 MED ORDER — SPIRONOLACTONE 100 MG PO TABS: 50.0000 mg | ORAL_TABLET | Freq: Two times a day (BID) | ORAL | 0 refills | Status: DC

## 2020-12-27 NOTE — Telephone Encounter (Signed)
Patient is requesting refill on spironolactone 100 mg and losartan 25 mg just enough until appointment on 10/17 with new provider

## 2021-01-01 ENCOUNTER — Ambulatory Visit
Admission: EM | Admit: 2021-01-01 | Discharge: 2021-01-01 | Disposition: A | Discharge status: Home / Self Care | Payer: Medicare HMO | Attending: Family Medicine | Admitting: Family Medicine

## 2021-01-01 ENCOUNTER — Ambulatory Visit (INDEPENDENT_AMBULATORY_CARE_PROVIDER_SITE_OTHER): Payer: Medicare HMO

## 2021-01-01 ENCOUNTER — Other Ambulatory Visit: Payer: Self-pay

## 2021-01-01 DIAGNOSIS — R051 Acute cough: Secondary | ICD-10-CM | POA: Diagnosis not present

## 2021-01-01 DIAGNOSIS — R062 Wheezing: Secondary | ICD-10-CM

## 2021-01-01 DIAGNOSIS — R053 Chronic cough: Secondary | ICD-10-CM

## 2021-01-01 MED ORDER — PREDNISONE 20 MG PO TABS: 40.0000 mg | ORAL_TABLET | Freq: Every day | ORAL | 0 refills | Status: DC

## 2021-01-01 NOTE — ED Triage Notes (Signed)
Pt presents with cough x several months. Reports that over the last week it has gotten worse. Reports she aspirated some soft drink last night. She feels like she did not cough it all up. Pt also feels some rattling in her chest when breathing. Pt had negative at home test.

## 2021-01-04 NOTE — ED Provider Notes (Signed)
[name]   [id number] 01/01/21 Arrival Time: 3299  ASSESSMENT & PLAN:  1. Acute cough   2. Wheezing    I have personally viewed the imaging studies ordered this visit. No signs of PNA. RN called pt to discuss radiologist's recommendations.  OTC symptom care as needed.  Meds ordered this encounter  Medications   predniSONE (DELTASONE) 20 MG tablet    Sig: Take 2 tablets (40 mg total) by mouth daily.    Dispense:  10 tablet    Refill:  0     Follow-up Information     Lovena Le, Malena M, DO.   Specialty: Family Medicine Why: If worsening or failing to improve as anticipated. Contact information: [place] [id number] [phone]                 Reviewed expectations re: course of current medical issues. Questions answered. Outlined signs and symptoms indicating need for more acute intervention. Understanding verbalized. After Visit Summary given.   SUBJECTIVE: History from: patient. Deanna Thompson is a 65 y.o. female who reports cough; couple of months; feels worse past week. Ques if she aspirated water last evening. No SOB. Denies: fever. Normal PO intake without n/v/d.   OBJECTIVE:  Vitals:   01/01/21 1037  BP: 127/80  Pulse: 77  Resp: 19  Temp: 97.6 F (36.4 C)  SpO2: 96%    General appearance: alert; no distress Eyes: PERRLA; EOMI; conjunctiva normal HENT: [place]; AT; with mild nasal congestion Neck: supple  Lungs: speaks full sentences without difficulty; unlabored; CTAB Extremities: no edema Skin: warm and dry Neurologic: normal gait Psychological: alert and cooperative; normal mood and affect  Imaging: DG Chest 2 View  Result Date: 01/01/2021 CLINICAL DATA:  Chronic cough EXAM: CHEST - 2 VIEW COMPARISON:  Chest x-ray dated August 28, 2020 FINDINGS: Cardiac contours upper limits of normal in size. Normal mediastinal contours. Bilateral peribronchial cuffing and new mild linear opacity of the left upper lobe. No  pleural effusion or pneumothorax. IMPRESSION: Bilateral peribronchial cuffing which can be seen in the setting of bronchitis or pulmonary edema. Mild linear opacity in the left upper lobe, likely superimposed atelectasis. Recommend follow-up chest x-ray in 6 to 8 weeks to ensure resolution. Electronically Signed   By: Yetta Glassman M.D.   On: 01/01/2021 11:56    Allergies  Allergen Reactions   Omeprazole Shortness Of Breath   Orencia [Abatacept] Anaphylaxis, Hives and Rash   Other Shortness Of Breath, Rash and Other (See Comments)    Red Meat Causes shortness of breath, rash and flu like symptoms.    Phenytoin Rash    Dilantin    Keflex [Cephalexin] Other (See Comments)    Severe yeast infection   Lidocaine Viscous Hcl     NAUSEA/PROLONGED NUMBNESS   Phenergan [Promethazine Hcl] Other (See Comments)    Not in right state of mind, HALLUCINATION    Past Medical History:  Diagnosis Date   Abdominal hernia    repaired   Abscess of pulp of tooth 12/12/14   left side   Anxiety    Arthritis    back  and down flank  right side more than left   B12 deficiency 06/12/2015   Bipolar disorder ([place])    Brain aneurysm    METAL COIL PREVENTS MRI IMAGING and CLIP   Cirrhosis ([place])    Presumably alcoholic cirrhosis   Complication of anesthesia    Depression    ETOH abuse    Quit in  07/2011    Fatigue    due to Sjogren's disease   Flat foot (pes planus) (acquired), left foot    Folate deficiency 06/30/2016   GERD (gastroesophageal reflux disease)    HTN (hypertension)    no longer medicated, dated on 04/25/12   IBS (irritable bowel syndrome)    Iron deficiency anemia due to chronic blood loss    Myofascial pain    Panic attack    PONV (postoperative nausea and vomiting)    Psoriatic arthritis ([place])    Right inguinal hernia 08/16/2012   Seizures ([place]) 1997   1 seizure from coil insertion but no more seizures and no meds for seizures   Sjogren's disease ([place])    Stroke ([place]) 2019    [place] 02/03/2007   Qualifier: Diagnosis of  By: Aline Brochure MD, Dorothyann Peng     TIA (transient ischemic attack)    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([place])    Vertigo    Social History   Socioeconomic History   Marital status: Married    Spouse name: Management consultant   Number of children: 0   Years of education: Not on file   Highest education level: Not on file  Occupational History    Employer: RETIRED  Tobacco Use   Smoking status: Former    Packs/day: 2.00    Years: 12.00    Pack years: 24.00    Types: Cigarettes    Quit date: 03/31/1995    Years since quitting: 25.7   Smokeless tobacco: Never   Tobacco comments:    quit in 1997  Vaping Use   Vaping Use: Never used  Substance and Sexual Activity   Alcohol use: No    Comment: Bottle of wine a day for years, but quit in 07/2011   Drug use: No   Sexual activity: Not Currently    Birth control/protection: Post-menopausal  Other Topics Concern   Not on file  Social History Narrative   Lives   Caffeine use:    Quit drinking alcohol 3 YEARS ago. No drugs, no IVDU.    Social Determinants of Health   Financial Resource Strain: Not on file  Food Insecurity: Not on file  Transportation Needs: Not on file  Physical Activity: Not on file  Stress: Not on file  Social Connections: Not on file  Intimate Partner Violence: Not on file   Family History  Problem Relation Age of Onset   Cirrhosis Mother        nash   COPD Father    Heart disease Father    Asthma Other    Heart disease Other    Colon cancer Neg Hx    Inflammatory bowel disease Neg Hx    Past Surgical History:  Procedure Laterality Date   AGILE CAPSULE N/A 06/21/2014   Procedure: AGILE CAPSULE;  Surgeon: Danie Binder, MD;  [facility];  Service: Endoscopy;  Laterality: N/A;  0700   BIOPSY  10/13/2011   SLF: mild gastritis/duodenal polypoid lesion    BIOPSY  06/04/2020   Procedure: BIOPSY;  Surgeon: Eloise Harman, DO;  [facility];  Service: Endoscopy;;   CEREBRAL ANEURYSM REPAIR  1997   CHOLECYSTECTOMY N/A 10/28/2018   Procedure: LAPAROSCOPIC CHOLECYSTECTOMY WITH INTRAOPERATIVE CHOLANGIOGRAM;  Surgeon: Virl Cagey, MD;  [facility];  Service: General;  Laterality: N/A;   COLONOSCOPY  10/13/2011   VZC:HYIFOYDX hemorrhoids/varices rectal/lesion in ascending colon(HYPERPLASTIC POLYP)   COLONOSCOPY N/A 01/17/2013   SLF: 1. Internal  hemorrhoids 2. Ascending colon polyps 3.  Rectal varices.    COLONOSCOPY N/A 08/10/2016   Dr. Oneida Alar: redundant left colon. external and internal hemorhroids. 5-10 year surveillance   ESOPHAGOGASTRODUODENOSCOPY N/A 06/29/2014   SLF: 1. anemia due to polypoid lesions in teh antrum, and duodenal polyps 2. single duodenum AVM   ESOPHAGOGASTRODUODENOSCOPY (EGD) WITH PROPOFOL N/A 03/07/2019   Procedure: ESOPHAGOGASTRODUODENOSCOPY (EGD) WITH PROPOFOL;  Surgeon: Danie Binder, MD;  [facility];  Service: Endoscopy;  Laterality: N/A;  1:00pm   ESOPHAGOGASTRODUODENOSCOPY (EGD) WITH PROPOFOL N/A 03/16/2019   Procedure: ESOPHAGOGASTRODUODENOSCOPY (EGD) WITH PROPOFOL;  Surgeon: Danie Binder, MD;  [facility];  Service: Endoscopy;  Laterality: N/A;   ESOPHAGOGASTRODUODENOSCOPY (EGD) WITH PROPOFOL N/A 06/04/2020   Procedure: ESOPHAGOGASTRODUODENOSCOPY (EGD) WITH PROPOFOL;  Surgeon: Eloise Harman, DO;  [facility];  Service: Endoscopy;  Laterality: N/A;  am   FLEXIBLE SIGMOIDOSCOPY N/A 01/21/2016   Procedure: FLEXIBLE SIGMOIDOSCOPY;  Surgeon: Danie Binder, MD;  [facility];  Service: Endoscopy;  Laterality: N/A;  [place] N/A 06/29/2014   Procedure: GIVENS CAPSULE STUDY;  Surgeon: Danie Binder, MD;  [facility];  Service: Endoscopy;  Laterality: N/A;   GIVENS CAPSULE STUDY N/A 07/13/2014   Procedure: GIVENS CAPSULE STUDY;  Surgeon: Danie Binder, MD;  [facility];  Service: Endoscopy;   Laterality: N/A;  [place] N/A 01/21/2016   Procedure: HEMORRHOID BANDING;  Surgeon: Danie Binder, MD;  [facility];  Service: Endoscopy;  Laterality: N/A;   HEMOSTASIS CLIP PLACEMENT  03/16/2019   Procedure: HEMOSTASIS CLIP PLACEMENT;  Surgeon: Danie Binder, MD;  [facility];  Service: Endoscopy;;  x 4   HEMOSTASIS CONTROL  03/16/2019   Procedure: HEMOSTASIS CONTROL;  Surgeon: Danie Binder, MD;  [facility];  Service: Endoscopy;;  injection with epinephine 0.1mg /ml via scope and bipolar cautery with gold probe   INGUINAL HERNIA REPAIR Right 08/28/2013   Procedure: RIGHT [place];  Surgeon: Jamesetta So, MD;  [facility];  Service: General;  Laterality: Right;   INSERTION OF MESH N/A 06/09/2013   Procedure: INSERTION OF MESH;  Surgeon: Jamesetta So, MD;  [facility];  Service: General;  Laterality: N/A;   INSERTION OF MESH Right 08/28/2013   Procedure: INSERTION OF MESH;  Surgeon: Jamesetta So, MD;  [facility];  Service: General;  Laterality: Right;   PARACENTESIS  Feb 2014   10.7 liters   TOOTH EXTRACTION Left 05/23/80   UMBILICAL HERNIA REPAIR N/A 06/09/2013   Procedure: UMBILICAL HERNIORRHAPHY WITH MESH;  Surgeon: Jamesetta So, MD;  [facility];  Service: General;  Laterality: Ginette Pitman, MD 01/04/21 5003

## 2021-01-07 ENCOUNTER — Ambulatory Visit: Payer: Medicare HMO | Admitting: Gastroenterology

## 2021-01-07 NOTE — Telephone Encounter (Signed)
Appointment scheduled for 01/13/21

## 2021-01-13 ENCOUNTER — Ambulatory Visit (INDEPENDENT_AMBULATORY_CARE_PROVIDER_SITE_OTHER): Payer: Medicare HMO | Admitting: Family Medicine

## 2021-01-13 ENCOUNTER — Other Ambulatory Visit: Payer: Self-pay

## 2021-01-13 VITALS — BP 120/81 | HR 74 | Temp 97.2°F | Ht 67.0 in | Wt 266.0 lb

## 2021-01-13 DIAGNOSIS — Z862 Personal history of diseases of the blood and blood-forming organs and certain disorders involving the immune mechanism: Secondary | ICD-10-CM

## 2021-01-13 DIAGNOSIS — E785 Hyperlipidemia, unspecified: Secondary | ICD-10-CM | POA: Diagnosis not present

## 2021-01-13 DIAGNOSIS — K7030 Alcoholic cirrhosis of liver without ascites: Secondary | ICD-10-CM | POA: Diagnosis not present

## 2021-01-13 DIAGNOSIS — R5383 Other fatigue: Secondary | ICD-10-CM | POA: Diagnosis not present

## 2021-01-13 DIAGNOSIS — I671 Cerebral aneurysm, nonruptured: Secondary | ICD-10-CM

## 2021-01-13 DIAGNOSIS — Z23 Encounter for immunization: Secondary | ICD-10-CM | POA: Diagnosis not present

## 2021-01-13 DIAGNOSIS — R931 Abnormal findings on diagnostic imaging of heart and coronary circulation: Secondary | ICD-10-CM | POA: Insufficient documentation

## 2021-01-13 DIAGNOSIS — I10 Essential (primary) hypertension: Secondary | ICD-10-CM

## 2021-01-13 DIAGNOSIS — N1831 Chronic kidney disease, stage 3a: Secondary | ICD-10-CM

## 2021-01-13 DIAGNOSIS — F319 Bipolar disorder, unspecified: Secondary | ICD-10-CM | POA: Insufficient documentation

## 2021-01-13 NOTE — Progress Notes (Signed)
Subjective:  Patient ID: Deanna Thompson, female    DOB: [birth year]  Age: 65 y.o. MRN: [id number]  CC: Chief Complaint  Patient presents with   Hypertension   Anemia   Cirrhosis    Referral     HPI:  65 year old female with an extensive and complicated past medical history presents for follow-up and to establish care with me.  Liver cirrhosis Most recent LFTs were normal.  Follows with GI.  He states that her provider has left and she would like a referral to our GI for continued surveillance and monitoring. She is currently on spironolactone and Bumex and denies any edema. She states that she is doing well.  History of brain aneurysm and stroke Currently on aspirin 81 mg daily.  Was previously getting surveillance with angiograms.  Has not done this in quite some time.  Needs referral.  Hypertension Stable.  Well-controlled.  Currently on losartan and Bumex and spironolactone  Decreased ejection fraction Upon review of her medical records, this has been noted off an echocardiogram in 2019.  She has not had a repeat echocardiogram to reassess.  We will discuss this today.  Hyperlipidemia Has been well controlled.  She is requesting labs today.  She is currently on Lipitor 40 mg daily.  Patient Active Problem List   Diagnosis Date Noted   Bipolar disorder ([place]) 01/13/2021   Hyperlipidemia 01/13/2021   Decreased cardiac ejection fraction 01/13/2021   IBS (irritable bowel syndrome)    History of stroke 08/01/2017   Essential hypertension 07/31/2017   GERD (gastroesophageal reflux disease) 07/31/2017   Brain aneurysm 07/31/2017   CKD (chronic kidney disease), stage III ([place]) 07/31/2017   LBBB (left bundle branch block) 07/31/2017   Constipation 04/01/2016   Psoriatic arthritis ([place]) 04/15/2015   Cirrhosis of liver ([place]) 09/19/2014   Iron deficiency anemia due to chronic blood loss    Insomnia 03/17/2014   Colon polyps 11/18/2011    Social Hx   Social History    Socioeconomic History   Marital status: Married    Spouse name: Management consultant   Number of children: 0   Years of education: Not on file   Highest education level: Not on file  Occupational History    Employer: RETIRED  Tobacco Use   Smoking status: Former    Packs/day: 2.00    Years: 12.00    Pack years: 24.00    Types: Cigarettes    Quit date: 03/31/1995    Years since quitting: 25.8   Smokeless tobacco: Never   Tobacco comments:    quit in 1997  Vaping Use   Vaping Use: Never used  Substance and Sexual Activity   Alcohol use: No    Comment: Bottle of wine a day for years, but quit in 07/2011   Drug use: No   Sexual activity: Not Currently    Birth control/protection: Post-menopausal  Other Topics Concern   Not on file  Social History Narrative   Lives   Caffeine use:    Quit drinking alcohol 3 YEARS ago. No drugs, no IVDU.    Social Determinants of Health   Financial Resource Strain: Not on file  Food Insecurity: Not on file  Transportation Needs: Not on file  Physical Activity: Not on file  Stress: Not on file  Social Connections: Not on file    Review of Systems  Constitutional:  Positive for fatigue.  Respiratory: Negative.    Cardiovascular:  Negative for leg swelling.    Objective:  BP  120/81   Pulse 74   Temp (!) 97.2 F (36.2 C)   Ht 5\' 7"  (1.702 m)   Wt 266 lb (120.7 kg)   SpO2 99%   BMI 41.66 kg/m   BP/Weight 01/13/2021 23/05/74 05/02/6331  Systolic BP 545 625 638  Diastolic BP 81 80 78  Wt. (Lbs) 266 - -  BMI 41.66 - -    Physical Exam Constitutional:      General: She is not in acute distress.    Appearance: Normal appearance. She is obese. She is not ill-appearing.  HENT:     Head: Normocephalic and atraumatic.  Eyes:     General: Scleral icterus present.        Right eye: No discharge.        Left eye: No discharge.     Conjunctiva/sclera: Conjunctivae normal.  Cardiovascular:     Rate and Rhythm: Normal rate and regular  rhythm.     Heart sounds: No murmur heard. Pulmonary:     Effort: Pulmonary effort is normal.     Breath sounds: Normal breath sounds. No wheezing or rales.  Abdominal:     General: There is no distension.     Palpations: Abdomen is soft.     Tenderness: There is no abdominal tenderness.  Neurological:     Mental Status: She is alert.  Psychiatric:        Mood and Affect: Mood normal.        Behavior: Behavior normal.    Lab Results  Component Value Date   WBC 6.5 12/29/2019   HGB 13.5 12/29/2019   HCT 41.3 12/29/2019   PLT 262 12/29/2019   GLUCOSE 139 (H) 12/29/2019   CHOL 157 11/20/2019   TRIG 86 11/20/2019   HDL 85 11/20/2019   LDLCALC 56 11/20/2019   ALT 25 12/29/2019   AST 26 12/29/2019   NA 138 12/29/2019   K 3.9 12/29/2019   CL 104 12/29/2019   CREATININE 1.21 (H) 12/29/2019   BUN 19 12/29/2019   CO2 24 12/29/2019   TSH 8.256 (H) 08/25/2011   INR 1.2 03/14/2019   HGBA1C 5.8 (H) 08/01/2017     Assessment & Plan:   Problem List Items Addressed This Visit       Cardiovascular and Mediastinum   Brain aneurysm    History of multiple aneurysms s/p coiling. No recent surveillance. Referring to interventional radiology.      Relevant Medications   aspirin EC 81 MG tablet   Other Relevant Orders   Ambulatory referral to Interventional Radiology   Essential hypertension    At goal. Continue Losartan, Bumex & Spironolactone.      Relevant Medications   aspirin EC 81 MG tablet     Digestive   Cirrhosis of liver (HCC) - Primary    Stable. Wants to see [facility].  Placing referral.      Relevant Orders   Comprehensive metabolic panel   Folate   Ambulatory referral to Gastroenterology     Genitourinary   CKD (chronic kidney disease), stage III ([place])    Has been stable. Labs today.         Other   Decreased cardiac ejection fraction    Placing order for Echo.      Relevant Orders   ECHOCARDIOGRAM COMPLETE   Hyperlipidemia    At goal.   Continue Lipitor.  Labs today.      Relevant Medications   aspirin EC 81 MG tablet   Other  Relevant Orders   Lipid panel   Other Visit Diagnoses     History of anemia       Relevant Orders   CBC   Iron, TIBC and Ferritin Panel   Vitamin B12   Other fatigue       Relevant Orders   Vitamin D (25 hydroxy)   Immunization due       Relevant Orders   Flu Vaccine QUAD 6+ mos PF IM (Fluarix Quad PF) (Completed)       Follow-up:  3-6 months  Bee Hammerschmidt Lacinda Axon DO [place]

## 2021-01-13 NOTE — Assessment & Plan Note (Signed)
Has been stable. Labs today.

## 2021-01-13 NOTE — Assessment & Plan Note (Signed)
Placing order for Echo.

## 2021-01-13 NOTE — Assessment & Plan Note (Signed)
At goal.  Continue Lipitor.  Labs today.

## 2021-01-13 NOTE — Assessment & Plan Note (Signed)
At goal. Continue Losartan, Bumex & Spironolactone.

## 2021-01-13 NOTE — Patient Instructions (Signed)
Flu shot today.  Labs today.  Get your pap smear with Dr. Elonda Husky.  I am placing referrals as we discussed as well as an echocardiogram.  Follow up in 3-6 months.  Take care  Dr. Lacinda Axon

## 2021-01-13 NOTE — Assessment & Plan Note (Signed)
History of multiple aneurysms s/p coiling. No recent surveillance. Referring to interventional radiology.

## 2021-01-13 NOTE — Assessment & Plan Note (Signed)
Stable. Wants to see [facility].  Placing referral.

## 2021-01-14 ENCOUNTER — Other Ambulatory Visit: Payer: Self-pay

## 2021-01-14 DIAGNOSIS — K7030 Alcoholic cirrhosis of liver without ascites: Secondary | ICD-10-CM

## 2021-01-14 DIAGNOSIS — I10 Essential (primary) hypertension: Secondary | ICD-10-CM

## 2021-01-14 MED ORDER — SPIRONOLACTONE 100 MG PO TABS: 50.0000 mg | ORAL_TABLET | Freq: Two times a day (BID) | ORAL | 5 refills | Status: DC

## 2021-01-14 MED ORDER — LOSARTAN POTASSIUM 25 MG PO TABS: ORAL_TABLET | ORAL | 1 refills | Status: DC

## 2021-01-14 NOTE — Telephone Encounter (Signed)
Patient had appointment on 01/13/21

## 2021-01-14 NOTE — Telephone Encounter (Signed)
Refills sent to pharmacy. 

## 2021-01-15 ENCOUNTER — Other Ambulatory Visit: Payer: Self-pay | Admitting: Family Medicine

## 2021-01-15 DIAGNOSIS — D500 Iron deficiency anemia secondary to blood loss (chronic): Secondary | ICD-10-CM

## 2021-01-15 LAB — COMPREHENSIVE METABOLIC PANEL
ALT: 24 IU/L (ref 0–32)
AST: 30 IU/L (ref 0–40)
Albumin/Globulin Ratio: 1.8 (ref 1.2–2.2)
Albumin: 4.6 g/dL (ref 3.8–4.8)
Alkaline Phosphatase: 90 IU/L (ref 44–121)
BUN/Creatinine Ratio: 11 — ABNORMAL LOW (ref 12–28)
BUN: 14 mg/dL (ref 8–27)
Bilirubin Total: 0.4 mg/dL (ref 0.0–1.2)
CO2: 24 mmol/L (ref 20–29)
Calcium: 9.7 mg/dL (ref 8.7–10.3)
Chloride: 96 mmol/L (ref 96–106)
Creatinine, Ser: 1.24 mg/dL — ABNORMAL HIGH (ref 0.57–1.00)
Globulin, Total: 2.5 g/dL (ref 1.5–4.5)
Glucose: 97 mg/dL (ref 70–99)
Potassium: 4.4 mmol/L (ref 3.5–5.2)
Sodium: 139 mmol/L (ref 134–144)
Total Protein: 7.1 g/dL (ref 6.0–8.5)
eGFR: 49 mL/min/{1.73_m2} — ABNORMAL LOW (ref >59)

## 2021-01-15 LAB — CBC
Hematocrit: 33.5 % — ABNORMAL LOW (ref 34.0–46.6)
Hemoglobin: 11.1 g/dL (ref 11.1–15.9)
MCH: 27.1 pg (ref 26.6–33.0)
MCHC: 33.1 g/dL (ref 31.5–35.7)
MCV: 82 fL (ref 79–97)
Platelets: 425 10*3/uL (ref 150–450)
RBC: 4.09 x10E6/uL (ref 3.77–5.28)
RDW: 15.5 % — ABNORMAL HIGH (ref 11.7–15.4)
WBC: 8.2 10*3/uL (ref 3.4–10.8)

## 2021-01-15 LAB — LIPID PANEL
Chol/HDL Ratio: 2 ratio (ref 0.0–4.4)
Cholesterol, Total: 160 mg/dL (ref 100–199)
HDL: 79 mg/dL (ref >39)
LDL Chol Calc (NIH): 59 mg/dL (ref 0–99)
Triglycerides: 126 mg/dL (ref 0–149)
VLDL Cholesterol Cal: 22 mg/dL (ref 5–40)

## 2021-01-15 LAB — IRON,TIBC AND FERRITIN PANEL
Ferritin: 10 ng/mL — ABNORMAL LOW (ref 15–150)
Iron Saturation: 8 % — CL (ref 15–55)
Iron: 36 ug/dL (ref 27–139)
Total Iron Binding Capacity: 427 ug/dL (ref 250–450)
UIBC: 391 ug/dL — ABNORMAL HIGH (ref 118–369)

## 2021-01-15 LAB — VITAMIN D 25 HYDROXY (VIT D DEFICIENCY, FRACTURES): Vit D, 25-Hydroxy: 31.9 ng/mL (ref 30.0–100.0)

## 2021-01-15 LAB — FOLATE: Folate: 3.6 ng/mL (ref >3.0)

## 2021-01-15 LAB — VITAMIN B12: Vitamin B-12: 415 pg/mL (ref 232–1245)

## 2021-01-22 NOTE — Progress Notes (Signed)
[place], [place] [address]   CLINIC:  Medical Oncology/Hematology  PCP:  Coral Spikes, DO [place] [address] [phone]   REASON FOR VISIT:  Follow-up for iron deficiency anemia  CURRENT THERAPY: Intermittent iron infusions  INTERVAL HISTORY:  Deanna Thompson 65 y.o. female returns for routine follow-up of iron deficiency anemia.  She was last seen in our clinic by NP Francene Finders on 09/19/2019, but was lost to follow-up after that visit.  She has been referred back to [place] by her PCP due to recurrent iron deficiency.  At today's visit, she reports feeling fair apart from her fatigue.  No recent hospitalizations, surgeries, or changes in baseline health status.  Although she has a history of  bleeding AVM's, she reports that she has not noted any bleeding episodes since December 2020 - no hematemesis, hematochezia, or melena.  She admits to significant fatigue as well as symptoms of pica and mild dyspnea on exertion.  She denies any restless leg symptoms, chest pain, lightheadedness, and syncope.  She has tried iron pills in the past but was unable to tolerate them due to exacerbations of her IBS constipation.  She has little to no energy and 100% appetite. She endorses that she is maintaining a stable weight.    REVIEW OF SYSTEMS:  Review of Systems  Constitutional:  Positive for fatigue. Negative for appetite change, chills, diaphoresis, fever and unexpected weight change.  HENT:   Positive for trouble swallowing. Negative for lump/mass and nosebleeds.   Eyes:  Negative for eye problems.  Respiratory:  Positive for cough. Negative for hemoptysis and shortness of breath.   Cardiovascular:  Negative for chest pain, leg swelling and palpitations.  Gastrointestinal:  Positive for constipation and diarrhea. Negative for abdominal pain, blood in stool, nausea and vomiting.  Genitourinary:  Negative for hematuria.   Skin:  Negative.   Neurological:  Positive for dizziness (vertigo). Negative for headaches and light-headedness.  Hematological:  Does not bruise/bleed easily.     PAST MEDICAL/SURGICAL HISTORY:  Past Medical History:  Diagnosis Date   Abdominal hernia    repaired   Abscess of pulp of tooth 12/12/14   left side   Anxiety    Arthritis    back  and down flank  right side more than left   B12 deficiency 06/12/2015   Bipolar disorder ([place])    Brain aneurysm    METAL COIL PREVENTS MRI IMAGING and CLIP   Cirrhosis ([place])    Presumably alcoholic cirrhosis   Complication of anesthesia    Depression    ETOH abuse    Quit in 07/2011    Fatigue    due to Sjogren's disease   Flat foot (pes planus) (acquired), left foot    Folate deficiency 06/30/2016   GERD (gastroesophageal reflux disease)    HTN (hypertension)    no longer medicated, dated on 04/25/12   IBS (irritable bowel syndrome)    Iron deficiency anemia due to chronic blood loss    Myofascial pain    Panic attack    PONV (postoperative nausea and vomiting)    Psoriatic arthritis ([place])    Right inguinal hernia 08/16/2012   Seizures ([place]) 1997   1 seizure from coil insertion but no more seizures and no meds for seizures   Sjogren's disease ([place])    Stroke ([place]) 2019   [place] 02/03/2007   Qualifier: Diagnosis of  By: Aline Brochure MD,  [place]     TIA (transient ischemic attack)    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([place])    Vertigo    Past Surgical History:  Procedure Laterality Date   AGILE CAPSULE N/A 06/21/2014   Procedure: AGILE CAPSULE;  Surgeon: Danie Binder, MD;  [facility];  Service: Endoscopy;  Laterality: N/A;  0700   BIOPSY  10/13/2011   SLF: mild gastritis/duodenal polypoid lesion    BIOPSY  06/04/2020   Procedure: BIOPSY;  Surgeon: Eloise Harman, DO;  [facility];  Service: Endoscopy;;   CEREBRAL ANEURYSM REPAIR  1997   CHOLECYSTECTOMY N/A 10/28/2018   Procedure:  LAPAROSCOPIC CHOLECYSTECTOMY WITH INTRAOPERATIVE CHOLANGIOGRAM;  Surgeon: Virl Cagey, MD;  Location: [facility];  Service: General;  Laterality: N/A;   COLONOSCOPY  10/13/2011   [id number] hemorrhoids/varices rectal/lesion in ascending colon(HYPERPLASTIC POLYP)   COLONOSCOPY N/A 01/17/2013   SLF: 1. Internal hemorrhoids 2. Ascending colon polyps 3.  Rectal varices.    COLONOSCOPY N/A 08/10/2016   Dr. Oneida Alar: redundant left colon. external and internal hemorhroids. 5-10 year surveillance   ESOPHAGOGASTRODUODENOSCOPY N/A 06/29/2014   SLF: 1. anemia due to polypoid lesions in teh antrum, and duodenal polyps 2. single duodenum AVM   ESOPHAGOGASTRODUODENOSCOPY (EGD) WITH PROPOFOL N/A 03/07/2019   Procedure: ESOPHAGOGASTRODUODENOSCOPY (EGD) WITH PROPOFOL;  Surgeon: Danie Binder, MD;  [facility];  Service: Endoscopy;  Laterality: N/A;  1:00pm   ESOPHAGOGASTRODUODENOSCOPY (EGD) WITH PROPOFOL N/A 03/16/2019   Procedure: ESOPHAGOGASTRODUODENOSCOPY (EGD) WITH PROPOFOL;  Surgeon: Danie Binder, MD;  [facility];  Service: Endoscopy;  Laterality: N/A;   ESOPHAGOGASTRODUODENOSCOPY (EGD) WITH PROPOFOL N/A 06/04/2020   Procedure: ESOPHAGOGASTRODUODENOSCOPY (EGD) WITH PROPOFOL;  Surgeon: Eloise Harman, DO;  [facility];  Service: Endoscopy;  Laterality: N/A;  am   FLEXIBLE SIGMOIDOSCOPY N/A 01/21/2016   Procedure: FLEXIBLE SIGMOIDOSCOPY;  Surgeon: Danie Binder, MD;  [facility];  Service: Endoscopy;  Laterality: N/A;  [place] N/A 06/29/2014   Procedure: GIVENS CAPSULE STUDY;  Surgeon: Danie Binder, MD;  [facility];  Service: Endoscopy;  Laterality: N/A;   GIVENS CAPSULE STUDY N/A 07/13/2014   Procedure: GIVENS CAPSULE STUDY;  Surgeon: Danie Binder, MD;  [facility];  Service: Endoscopy;  Laterality: N/A;  [place] N/A 01/21/2016   Procedure: HEMORRHOID BANDING;  Surgeon: Danie Binder, MD;   [facility];  Service: Endoscopy;  Laterality: N/A;   HEMOSTASIS CLIP PLACEMENT  03/16/2019   Procedure: HEMOSTASIS CLIP PLACEMENT;  Surgeon: Danie Binder, MD;  [facility];  Service: Endoscopy;;  x 4   HEMOSTASIS CONTROL  03/16/2019   Procedure: HEMOSTASIS CONTROL;  Surgeon: Danie Binder, MD;  [facility];  Service: Endoscopy;;  injection with epinephine 0.1mg /ml via scope and bipolar cautery with gold probe   INGUINAL HERNIA REPAIR Right 08/28/2013   Procedure: RIGHT [place];  Surgeon: Jamesetta So, MD;  Location: [facility];  Service: General;  Laterality: Right;   INSERTION OF MESH N/A 06/09/2013   Procedure: INSERTION OF MESH;  Surgeon: Jamesetta So, MD;  Location: [facility];  Service: General;  Laterality: N/A;   INSERTION OF MESH Right 08/28/2013   Procedure: INSERTION OF MESH;  Surgeon: Jamesetta So, MD;  Location: [facility];  Service: General;  Laterality: Right;   PARACENTESIS  Feb 2014   10.7 liters  TOOTH EXTRACTION Left 6/37/85   UMBILICAL HERNIA REPAIR N/A 06/09/2013   Procedure: UMBILICAL HERNIORRHAPHY WITH MESH;  Surgeon: Jamesetta So, MD;  Location: [facility];  Service: General;  Laterality: N/A;     SOCIAL HISTORY:  Social History   Socioeconomic History   Marital status: Married    Spouse name: Management consultant   Number of children: 0   Years of education: Not on file   Highest education level: Not on file  Occupational History    Employer: RETIRED  Tobacco Use   Smoking status: Former    Packs/day: 2.00    Years: 12.00    Pack years: 24.00    Types: Cigarettes    Quit date: 03/31/1995    Years since quitting: 25.8   Smokeless tobacco: Never   Tobacco comments:    quit in 1997  Vaping Use   Vaping Use: Never used  Substance and Sexual Activity   Alcohol use: No    Comment: Bottle of wine a day for years, but quit in 07/2011   Drug use: No   Sexual activity: Not Currently    Birth control/protection:  Post-menopausal  Other Topics Concern   Not on file  Social History Narrative   Lives   Caffeine use:    Quit drinking alcohol 3 YEARS ago. No drugs, no IVDU.    Social Determinants of Health   Financial Resource Strain: Not on file  Food Insecurity: Not on file  Transportation Needs: Not on file  Physical Activity: Not on file  Stress: Not on file  Social Connections: Not on file  Intimate Partner Violence: Not on file    FAMILY HISTORY:  Family History  Problem Relation Age of Onset   Cirrhosis Mother        nash   COPD Father    Heart disease Father    Asthma Other    Heart disease Other    Colon cancer Neg Hx    Inflammatory bowel disease Neg Hx     CURRENT MEDICATIONS:  Outpatient Encounter Medications as of 01/23/2021  Medication Sig   aspirin EC 81 MG tablet Take 81 mg by mouth daily. Swallow whole.   atorvastatin (LIPITOR) 40 MG tablet TAKE (1) TABLET BY MOUTH ONCE DAILY.   bumetanide (BUMEX) 1 MG tablet TAKE (1) TABLET BY MOUTH TWICE DAILY.   buPROPion (WELLBUTRIN SR) 100 MG 12 hr tablet TAKE (1) TABLET BY MOUTH TWICE DAILY.   Camphor-Menthol-Methyl Sal (SALONPAS) 3.04-04-08 % PTCH Place 1 patch onto the skin daily as needed (pain.).    cholecalciferol (VITAMIN D3) 25 MCG (1000 UT) tablet Take 2 tablets (2,000 Units total) by mouth daily. (Patient taking differently: Take 1,000 Units by mouth every morning.)   DULoxetine (CYMBALTA) 20 MG capsule Take 40 mg by mouth daily.    famotidine (PEPCID) 20 MG tablet Take 1 tablet (20 mg total) by mouth 2 (two) times daily before a meal.   fluticasone (FLONASE) 50 MCG/ACT nasal spray Place 1 spray into both nostrils See admin instructions. Instill 1 spray into each nostril every morning, may also use later in the day as needed for sinus congestion   lamoTRIgine (LAMICTAL) 150 MG tablet Take 150 mg by mouth daily.   Lifitegrast 5 % SOLN Place 1 drop into both eyes 2 (two) times daily.   LINZESS 145 MCG CAPS capsule TAKE (1)  CAPSULE BY MOUTH EVERY DAY BEFORE BREAKFAST.   loratadine (CLARITIN) 10 MG tablet Take 10 mg by mouth daily.  losartan (COZAAR) 25 MG tablet TAKE (1) TABLET BY MOUTH ONCE DAILY.   Polyvinyl Alcohol-Povidone (REFRESH OP) Place 1 drop into both eyes 3 (three) times daily as needed (dry eyes).   Simethicone (GAS-X PO) Take 1 tablet by mouth 2 (two) times daily as needed (bloating/flatulence).   sodium chloride (OCEAN) 0.65 % nasal spray Place 1 spray into the nose 2 (two) times daily as needed for congestion.    spironolactone (ALDACTONE) 100 MG tablet Take 0.5 tablets (50 mg total) by mouth 2 (two) times daily.   Tofacitinib Citrate ER (XELJANZ XR) 11 MG TB24 Take 11 mg by mouth daily.    No facility-administered encounter medications on file as of 01/23/2021.    ALLERGIES:  Allergies  Allergen Reactions   Omeprazole Shortness Of Breath   Orencia [Abatacept] Anaphylaxis, Hives and Rash   Other Shortness Of Breath, Rash and Other (See Comments)    Red Meat Causes shortness of breath, rash and flu like symptoms.    Phenytoin Rash    Dilantin    Keflex [Cephalexin] Other (See Comments)    Severe yeast infection   Lidocaine Viscous Hcl     NAUSEA/PROLONGED NUMBNESS   Phenergan [Promethazine Hcl] Other (See Comments)    Not in right state of mind, HALLUCINATION     PHYSICAL EXAM:  ECOG PERFORMANCE STATUS: 1 - Symptomatic but completely ambulatory  There were no vitals filed for this visit. There were no vitals filed for this visit. Physical Exam Constitutional:      Appearance: Normal appearance. She is obese.  HENT:     Head: Normocephalic and atraumatic.     Mouth/Throat:     Mouth: Mucous membranes are moist.  Eyes:     Extraocular Movements: Extraocular movements intact.     Pupils: Pupils are equal, round, and reactive to light.  Cardiovascular:     Rate and Rhythm: Normal rate and regular rhythm.     Pulses: Normal pulses.     Heart sounds: Normal heart sounds.   Pulmonary:     Effort: Pulmonary effort is normal.     Breath sounds: Normal breath sounds.  Abdominal:     General: Bowel sounds are normal.     Palpations: Abdomen is soft.     Tenderness: There is no abdominal tenderness.  Musculoskeletal:        General: No swelling.     Right lower leg: No edema.     Left lower leg: No edema.  Lymphadenopathy:     Cervical: No cervical adenopathy.  Skin:    General: Skin is warm and dry.  Neurological:     General: No focal deficit present.     Mental Status: She is alert and oriented to person, place, and time.  Psychiatric:        Mood and Affect: Mood normal.        Behavior: Behavior normal.     LABORATORY DATA:  I have reviewed the labs as listed.  CBC    Component Value Date/Time   WBC 8.2 01/14/2021 1155   WBC 6.5 12/29/2019 1113   RBC 4.09 01/14/2021 1155   RBC 4.45 12/29/2019 1113   HGB 11.1 01/14/2021 1155   HCT 33.5 (L) 01/14/2021 1155   HCT 41 07/30/2011 0743   PLT 425 01/14/2021 1155   MCV 82 01/14/2021 1155   MCV 104.3 07/30/2011 0743   MCH 27.1 01/14/2021 1155   MCH 30.3 12/29/2019 1113   MCHC 33.1 01/14/2021 1155  MCHC 32.7 12/29/2019 1113   RDW 15.5 (H) 01/14/2021 1155   LYMPHSABS 0.5 (L) 12/29/2019 1113   LYMPHSABS 1.3 03/27/2019 0854   MONOABS 0.6 12/29/2019 1113   EOSABS 0.2 12/29/2019 1113   EOSABS 0.2 03/27/2019 0854   BASOSABS 0.0 12/29/2019 1113   BASOSABS 0.1 03/27/2019 0854   CMP Latest Ref Rng & Units 01/14/2021 12/29/2019 09/12/2019  Glucose 70 - 99 mg/dL 97 139(H) 104(H)  BUN 8 - 27 mg/dL 14 19 18   Creatinine 0.57 - 1.00 mg/dL 1.24(H) 1.21(H) 1.29(H)  Sodium 134 - 144 mmol/L 139 138 138  Potassium 3.5 - 5.2 mmol/L 4.4 3.9 3.8  Chloride 96 - 106 mmol/L 96 104 101  CO2 20 - 29 mmol/L 24 24 27   Calcium 8.7 - 10.3 mg/dL 9.7 9.7 9.5  Total Protein 6.0 - 8.5 g/dL 7.1 7.8 7.9  Total Bilirubin 0.0 - 1.2 mg/dL 0.4 0.7 0.6  Alkaline Phos 44 - 121 IU/L 90 85 83  AST 0 - 40 IU/L 30 26 28   ALT 0 -  32 IU/L 24 25 22     DIAGNOSTIC IMAGING:  I have independently reviewed the relevant imaging and discussed with the patient.  ASSESSMENT & PLAN: 1.  Iron deficiency anemia due to chronic blood loss - Due to chronic GI blood loss. 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  Also showed polypoid lesions in the duodenum, single duodenum AVM. - Her colonoscopy on 01/17/2013 showed rectal varices, polyps and hemorrhoids. - She had a flex sigmoidoscopy with Dr. Oneida Alar on 01/21/2016 showing rectal bleeding due to proctitis and possibly internal hemorrhoids. - Patient was hospitalized in December 2020 for an AVM bleed where her hemoglobin dropped to 7.7. 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  Injected.  Treated with bipolar cautery.  Clips were placed. - She has been transfused RBCs in the past - She has been referred to gastroenterology, and previously followed with Dr. Oneida Alar - She last received IV Venofer on 10/06/2019 - Most recent labs (01/14/2021): Hgb 11.1/MCV 82, ferritin 10 with iron saturation 8% and TIBC 427; B12 and folate were normal - No hematemesis, hematochezia, or melena -Symptomatic with pica, fatigue, dyspnea on exertion - PLAN: Recommend IV iron supplementation with IV Venofer 1000 mg in 3 divided doses.  Repeat anemia panel and RTC in 3 months.  (We will not premedicate - although patient has multiple medication allergies listed, she has previously tolerated Venofer well without premedication.)  2.  Other history - Her other pertinent medical history includes CKD stage IIIa,  liver cirrhosis, history of cerebral aneurysm with stroke, hyperlipidemia, and hypertension    PLAN SUMMARY & DISPOSITION: - Venofer x3 (300-300-400) - Repeat anemia panel in 3 months - RTC after labs  All questions were answered. The patient knows to call the  clinic with any problems, questions or concerns.  Medical decision making: Moderate  Time spent on visit: I spent 20 minutes counseling the patient face to face. The total time spent in the appointment was 30 minutes and more than 50% was on counseling.   Harriett Rush, PA-C  01/23/2021 7:24 PM

## 2021-01-23 ENCOUNTER — Inpatient Hospital Stay (HOSPITAL_COMMUNITY): Payer: Medicare HMO | Attending: Physician Assistant | Admitting: Physician Assistant

## 2021-01-23 ENCOUNTER — Other Ambulatory Visit: Payer: Self-pay

## 2021-01-23 VITALS — BP 115/67 | HR 92 | Temp 98.4°F | Resp 18 | Wt 267.6 lb

## 2021-01-23 DIAGNOSIS — F5089 Other specified eating disorder: Secondary | ICD-10-CM | POA: Insufficient documentation

## 2021-01-23 DIAGNOSIS — L4050 Arthropathic psoriasis, unspecified: Secondary | ICD-10-CM | POA: Diagnosis not present

## 2021-01-23 DIAGNOSIS — K7030 Alcoholic cirrhosis of liver without ascites: Secondary | ICD-10-CM | POA: Diagnosis not present

## 2021-01-23 DIAGNOSIS — R0609 Other forms of dyspnea: Secondary | ICD-10-CM | POA: Insufficient documentation

## 2021-01-23 DIAGNOSIS — R42 Dizziness and giddiness: Secondary | ICD-10-CM | POA: Insufficient documentation

## 2021-01-23 DIAGNOSIS — K31811 Angiodysplasia of stomach and duodenum with bleeding: Secondary | ICD-10-CM | POA: Diagnosis not present

## 2021-01-23 DIAGNOSIS — Z8249 Family history of ischemic heart disease and other diseases of the circulatory system: Secondary | ICD-10-CM | POA: Diagnosis not present

## 2021-01-23 DIAGNOSIS — I129 Hypertensive chronic kidney disease with stage 1 through stage 4 chronic kidney disease, or unspecified chronic kidney disease: Secondary | ICD-10-CM | POA: Diagnosis not present

## 2021-01-23 DIAGNOSIS — Z79899 Other long term (current) drug therapy: Secondary | ICD-10-CM | POA: Insufficient documentation

## 2021-01-23 DIAGNOSIS — N1831 Chronic kidney disease, stage 3a: Secondary | ICD-10-CM | POA: Diagnosis not present

## 2021-01-23 DIAGNOSIS — Z9049 Acquired absence of other specified parts of digestive tract: Secondary | ICD-10-CM | POA: Insufficient documentation

## 2021-01-23 DIAGNOSIS — K6289 Other specified diseases of anus and rectum: Secondary | ICD-10-CM | POA: Insufficient documentation

## 2021-01-23 DIAGNOSIS — M3500 Sicca syndrome, unspecified: Secondary | ICD-10-CM | POA: Insufficient documentation

## 2021-01-23 DIAGNOSIS — Z888 Allergy status to other drugs, medicaments and biological substances status: Secondary | ICD-10-CM | POA: Diagnosis not present

## 2021-01-23 DIAGNOSIS — D500 Iron deficiency anemia secondary to blood loss (chronic): Secondary | ICD-10-CM | POA: Insufficient documentation

## 2021-01-23 DIAGNOSIS — K589 Irritable bowel syndrome without diarrhea: Secondary | ICD-10-CM | POA: Insufficient documentation

## 2021-01-23 DIAGNOSIS — R197 Diarrhea, unspecified: Secondary | ICD-10-CM | POA: Diagnosis not present

## 2021-01-23 DIAGNOSIS — Z8379 Family history of other diseases of the digestive system: Secondary | ICD-10-CM | POA: Insufficient documentation

## 2021-01-23 DIAGNOSIS — Z8719 Personal history of other diseases of the digestive system: Secondary | ICD-10-CM | POA: Insufficient documentation

## 2021-01-23 DIAGNOSIS — Z881 Allergy status to other antibiotic agents status: Secondary | ICD-10-CM | POA: Diagnosis not present

## 2021-01-23 DIAGNOSIS — F319 Bipolar disorder, unspecified: Secondary | ICD-10-CM | POA: Diagnosis not present

## 2021-01-23 DIAGNOSIS — Z836 Family history of other diseases of the respiratory system: Secondary | ICD-10-CM | POA: Insufficient documentation

## 2021-01-23 DIAGNOSIS — E785 Hyperlipidemia, unspecified: Secondary | ICD-10-CM | POA: Insufficient documentation

## 2021-01-23 DIAGNOSIS — R059 Cough, unspecified: Secondary | ICD-10-CM | POA: Diagnosis not present

## 2021-01-23 DIAGNOSIS — Z8673 Personal history of transient ischemic attack (TIA), and cerebral infarction without residual deficits: Secondary | ICD-10-CM | POA: Insufficient documentation

## 2021-01-23 NOTE — Patient Instructions (Signed)
[facility] Discharge Instructions  You were seen today by Tarri Abernethy PA-C for your iron deficiency anemia.  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  Your cirrhosis also places you at increased risk of GI bleeding.  Although your blood levels are relatively normal, your iron levels are significantly low, which is contributing to your fatigue and shortness of breath.  We will schedule you for IV iron infusions x3 doses and will see you for follow-up visit in 3 months.  LABS: Return in 3 months for labs  OTHER TESTS: No other tests at this time  MEDICATIONS: No changes to home medications  FOLLOW-UP APPOINTMENT: Office visit in 3 months, after labs   Thank you for choosing [place] [facility] to provide your oncology and hematology care.  To afford each patient quality time with our provider, please arrive at least 15 minutes before your scheduled appointment time.   If you have a lab appointment with the [facility] please come in thru the Main Entrance and check in at the main information desk.  You need to re-schedule your appointment should you arrive 10 or more minutes late.  We strive to give you quality time with our providers, and arriving late affects you and other patients whose appointments are after yours.  Also, if you no show three or more times for appointments you may be dismissed from the clinic at the providers discretion.     Again, thank you for choosing [facility].  Our hope is that these requests will decrease the amount of time that you wait before being seen by our physicians.       _____________________________________________________________  Should you have questions after your visit to [facility], please contact our office at [phone] and follow the prompts.  Our office hours are 8:00 a.m. and 4:30 p.m.  Monday - Friday.  Please note that voicemails left after 4:00 p.m. may not be returned until the following business day.  We are closed weekends and major holidays.  You do have access to a nurse 24-7, just call the main number to the clinic [phone] and do not press any options, hold on the line and a nurse will answer the phone.    For prescription refill requests, have your pharmacy contact our office and allow 72 hours.    Due to Covid, you will need to wear a mask upon entering the hospital. If you do not have a mask, a mask will be given to you at the Main Entrance upon arrival. For doctor visits, patients may have 1 support person age 99 or older with them. For treatment visits, patients can not have anyone with them due to social distancing guidelines and our immunocompromised population.

## 2021-01-29 ENCOUNTER — Other Ambulatory Visit: Payer: Self-pay

## 2021-01-29 DIAGNOSIS — Z8673 Personal history of transient ischemic attack (TIA), and cerebral infarction without residual deficits: Secondary | ICD-10-CM

## 2021-01-29 MED ORDER — ATORVASTATIN CALCIUM 40 MG PO TABS
ORAL_TABLET | ORAL | 1 refills | Status: DC
Start: 2021-01-29 — End: 2021-09-15

## 2021-02-04 ENCOUNTER — Other Ambulatory Visit: Payer: Self-pay | Admitting: *Deleted

## 2021-02-04 DIAGNOSIS — Z8673 Personal history of transient ischemic attack (TIA), and cerebral infarction without residual deficits: Secondary | ICD-10-CM

## 2021-02-04 NOTE — Progress Notes (Deleted)
.  ho

## 2021-02-07 ENCOUNTER — Other Ambulatory Visit: Payer: Self-pay

## 2021-02-07 ENCOUNTER — Inpatient Hospital Stay (HOSPITAL_COMMUNITY): Payer: Medicare HMO | Attending: Physician Assistant

## 2021-02-07 VITALS — BP 127/61 | HR 78 | Temp 97.3°F | Resp 20

## 2021-02-07 DIAGNOSIS — D500 Iron deficiency anemia secondary to blood loss (chronic): Secondary | ICD-10-CM | POA: Diagnosis present

## 2021-02-07 DIAGNOSIS — I129 Hypertensive chronic kidney disease with stage 1 through stage 4 chronic kidney disease, or unspecified chronic kidney disease: Secondary | ICD-10-CM | POA: Diagnosis not present

## 2021-02-07 DIAGNOSIS — K5900 Constipation, unspecified: Secondary | ICD-10-CM | POA: Diagnosis not present

## 2021-02-07 DIAGNOSIS — R059 Cough, unspecified: Secondary | ICD-10-CM | POA: Insufficient documentation

## 2021-02-07 DIAGNOSIS — R5383 Other fatigue: Secondary | ICD-10-CM | POA: Insufficient documentation

## 2021-02-07 DIAGNOSIS — K922 Gastrointestinal hemorrhage, unspecified: Secondary | ICD-10-CM | POA: Insufficient documentation

## 2021-02-07 DIAGNOSIS — E785 Hyperlipidemia, unspecified: Secondary | ICD-10-CM | POA: Insufficient documentation

## 2021-02-07 DIAGNOSIS — Z8249 Family history of ischemic heart disease and other diseases of the circulatory system: Secondary | ICD-10-CM | POA: Diagnosis not present

## 2021-02-07 DIAGNOSIS — Z8379 Family history of other diseases of the digestive system: Secondary | ICD-10-CM | POA: Insufficient documentation

## 2021-02-07 DIAGNOSIS — Z79899 Other long term (current) drug therapy: Secondary | ICD-10-CM | POA: Insufficient documentation

## 2021-02-07 DIAGNOSIS — R197 Diarrhea, unspecified: Secondary | ICD-10-CM | POA: Diagnosis not present

## 2021-02-07 DIAGNOSIS — Z87891 Personal history of nicotine dependence: Secondary | ICD-10-CM | POA: Insufficient documentation

## 2021-02-07 DIAGNOSIS — N1831 Chronic kidney disease, stage 3a: Secondary | ICD-10-CM | POA: Diagnosis not present

## 2021-02-07 DIAGNOSIS — Z836 Family history of other diseases of the respiratory system: Secondary | ICD-10-CM | POA: Diagnosis not present

## 2021-02-07 DIAGNOSIS — R42 Dizziness and giddiness: Secondary | ICD-10-CM | POA: Diagnosis not present

## 2021-02-07 MED ORDER — ACETAMINOPHEN 325 MG PO TABS
325.0000 mg | ORAL_TABLET | Freq: Once | ORAL | Status: AC
  Administered 2021-02-07: 325 mg via ORAL

## 2021-02-07 MED ORDER — ACETAMINOPHEN 325 MG PO TABS
650.0000 mg | ORAL_TABLET | Freq: Once | ORAL | Status: DC
  Filled 2021-02-07: qty 2

## 2021-02-07 MED ORDER — LORATADINE 10 MG PO TABS
10.0000 mg | ORAL_TABLET | Freq: Once | ORAL | Status: AC
  Administered 2021-02-07: 10 mg via ORAL
  Filled 2021-02-07: qty 1

## 2021-02-07 MED ORDER — SODIUM CHLORIDE 0.9 % IV SOLN
300.0000 mg | Freq: Once | INTRAVENOUS | Status: AC
  Administered 2021-02-07: 300 mg via INTRAVENOUS
  Filled 2021-02-07: qty 300

## 2021-02-07 MED ORDER — SODIUM CHLORIDE 0.9 % IV SOLN
Freq: Once | INTRAVENOUS | Status: AC
Start: 2021-02-07 — End: 2021-02-07

## 2021-02-07 NOTE — Patient Instructions (Signed)
[facility]  Discharge Instructions: Thank you for choosing [facility] to provide your oncology and hematology care.  If you have a lab appointment with the Cancer Center, please come in thru the Main Entrance and check in at the main information desk.  Wear comfortable clothing and clothing appropriate for easy access to any Portacath or PICC line.   We strive to give you quality time with your provider. You may need to reschedule your appointment if you arrive late (15 or more minutes).  Arriving late affects you and other patients whose appointments are after yours.  Also, if you miss three or more appointments without notifying the office, you may be dismissed from the clinic at the provider's discretion.      For prescription refill requests, have your pharmacy contact our office and allow 72 hours for refills to be completed.    Today you received Venofer IV iron infusion.     BELOW ARE SYMPTOMS THAT SHOULD BE REPORTED IMMEDIATELY: *FEVER GREATER THAN 100.4 F (38 C) OR HIGHER *CHILLS OR SWEATING *NAUSEA AND VOMITING THAT IS NOT CONTROLLED WITH YOUR NAUSEA MEDICATION *UNUSUAL SHORTNESS OF BREATH *UNUSUAL BRUISING OR BLEEDING *URINARY PROBLEMS (pain or burning when urinating, or frequent urination) *BOWEL PROBLEMS (unusual diarrhea, constipation, pain near the anus) TENDERNESS IN MOUTH AND THROAT WITH OR WITHOUT PRESENCE OF ULCERS (sore throat, sores in mouth, or a toothache) UNUSUAL RASH, SWELLING OR PAIN  UNUSUAL VAGINAL DISCHARGE OR ITCHING   Items with * indicate a potential emergency and should be followed up as soon as possible or go to the Emergency Department if any problems should occur.  Please show the CHEMOTHERAPY ALERT CARD or IMMUNOTHERAPY ALERT CARD at check-in to the Emergency Department and triage nurse.  Should you have questions after your visit or need to cancel or reschedule your appointment, please contact [facility]  [phone]  and follow the prompts.  Office hours are 8:00 a.m. to 4:30 p.m. Monday - Friday. Please note that voicemails left after 4:00 p.m. may not be returned until the following business day.  We are closed weekends and major holidays. You have access to a nurse at all times for urgent questions. Please call the main number to the clinic [phone] and follow the prompts.  For any non-urgent questions, you may also contact your provider using MyChart. We now offer e-Visits for anyone 18 and older to request care online for non-urgent symptoms. For details visit [email or web address].   Also download the MyChart app! Go to the app store, search "MyChart", open the app, select [place], and log in with your MyChart username and password.  Due to Covid, a mask is required upon entering the hospital/clinic. If you do not have a mask, one will be given to you upon arrival. For doctor visits, patients may have 1 support person aged 18 or older with them. For treatment visits, patients cannot have anyone with them due to current Covid guidelines and our immunocompromised population.  

## 2021-02-07 NOTE — Progress Notes (Signed)
Pt presents today for Venofer IV iron infusion per provider's order. Vital signs stable and pt c/o  pain 4/10 in both elbows and requested Tylenol 325mg  p.o x 1 dose . Message sent to R.[place]-PA and she stated it was okay to give pt Tylenol 325 mg p.o x 1 dose.   Venofer IV iron infusion and Tylenol 325 mg p.o x 1 dose given today per MD orders. Tolerated infusion without adverse affects. Vital signs stable. No complaints at this time. Discharged from clinic ambulatory in stable condition. Alert and oriented x 3. F/U with [facility] as scheduled.

## 2021-02-14 ENCOUNTER — Inpatient Hospital Stay (HOSPITAL_COMMUNITY): Payer: Medicare HMO

## 2021-02-14 ENCOUNTER — Other Ambulatory Visit: Payer: Self-pay

## 2021-02-14 VITALS — BP 117/69 | HR 67 | Temp 97.8°F | Resp 18

## 2021-02-14 DIAGNOSIS — D500 Iron deficiency anemia secondary to blood loss (chronic): Secondary | ICD-10-CM

## 2021-02-14 MED ORDER — SODIUM CHLORIDE 0.9 % IV SOLN: Freq: Once | INTRAVENOUS | Status: AC

## 2021-02-14 MED ORDER — SODIUM CHLORIDE 0.9 % IV SOLN
300.0000 mg | Freq: Once | INTRAVENOUS | Status: AC
  Administered 2021-02-14: 300 mg via INTRAVENOUS
  Filled 2021-02-14: qty 300

## 2021-02-14 MED ORDER — LORATADINE 10 MG PO TABS
10.0000 mg | ORAL_TABLET | Freq: Once | ORAL | Status: AC
  Administered 2021-02-14: 10 mg via ORAL
  Filled 2021-02-14: qty 1

## 2021-02-14 NOTE — Patient Instructions (Signed)
[facility]  Discharge Instructions: Thank you for choosing [facility] to provide your oncology and hematology care.  If you have a lab appointment with the Cancer Center, please come in thru the Main Entrance and check in at the main information desk.  Wear comfortable clothing and clothing appropriate for easy access to any Portacath or PICC line.   We strive to give you quality time with your provider. You may need to reschedule your appointment if you arrive late (15 or more minutes).  Arriving late affects you and other patients whose appointments are after yours.  Also, if you miss three or more appointments without notifying the office, you may be dismissed from the clinic at the provider's discretion.      For prescription refill requests, have your pharmacy contact our office and allow 72 hours for refills to be completed.    Today you received Venofer IV iron infusion.     BELOW ARE SYMPTOMS THAT SHOULD BE REPORTED IMMEDIATELY: *FEVER GREATER THAN 100.4 F (38 C) OR HIGHER *CHILLS OR SWEATING *NAUSEA AND VOMITING THAT IS NOT CONTROLLED WITH YOUR NAUSEA MEDICATION *UNUSUAL SHORTNESS OF BREATH *UNUSUAL BRUISING OR BLEEDING *URINARY PROBLEMS (pain or burning when urinating, or frequent urination) *BOWEL PROBLEMS (unusual diarrhea, constipation, pain near the anus) TENDERNESS IN MOUTH AND THROAT WITH OR WITHOUT PRESENCE OF ULCERS (sore throat, sores in mouth, or a toothache) UNUSUAL RASH, SWELLING OR PAIN  UNUSUAL VAGINAL DISCHARGE OR ITCHING   Items with * indicate a potential emergency and should be followed up as soon as possible or go to the Emergency Department if any problems should occur.  Please show the CHEMOTHERAPY ALERT CARD or IMMUNOTHERAPY ALERT CARD at check-in to the Emergency Department and triage nurse.  Should you have questions after your visit or need to cancel or reschedule your appointment, please contact [facility]  [phone]  and follow the prompts.  Office hours are 8:00 a.m. to 4:30 p.m. Monday - Friday. Please note that voicemails left after 4:00 p.m. may not be returned until the following business day.  We are closed weekends and major holidays. You have access to a nurse at all times for urgent questions. Please call the main number to the clinic [phone] and follow the prompts.  For any non-urgent questions, you may also contact your provider using MyChart. We now offer e-Visits for anyone 18 and older to request care online for non-urgent symptoms. For details visit [email or web address].   Also download the MyChart app! Go to the app store, search "MyChart", open the app, select [place], and log in with your MyChart username and password.  Due to Covid, a mask is required upon entering the hospital/clinic. If you do not have a mask, one will be given to you upon arrival. For doctor visits, patients may have 1 support person aged 18 or older with them. For treatment visits, patients cannot have anyone with them due to current Covid guidelines and our immunocompromised population.  

## 2021-02-14 NOTE — Progress Notes (Signed)
Pt presents today for Venofer IV iron infusion per provider's order. Vital signs stable and pt voiced no new complaints at this time.  Peripheral IV started with good blood return pre and post infusion.  Venofer 300 mg IV iron infusion given today per MD orders. Tolerated infusion without adverse affects. Vital signs stable. No complaints at this time. Discharged from clinic ambulatory in stable condition. Alert and oriented x 3. F/U with [facility] as scheduled.

## 2021-02-28 ENCOUNTER — Inpatient Hospital Stay (HOSPITAL_COMMUNITY): Payer: Medicare HMO | Attending: Physician Assistant

## 2021-02-28 ENCOUNTER — Other Ambulatory Visit: Payer: Self-pay

## 2021-02-28 ENCOUNTER — Encounter (HOSPITAL_COMMUNITY): Payer: Self-pay

## 2021-02-28 VITALS — BP 136/81 | HR 75 | Temp 98.5°F | Resp 18 | Ht 68.0 in | Wt 267.8 lb

## 2021-02-28 DIAGNOSIS — K922 Gastrointestinal hemorrhage, unspecified: Secondary | ICD-10-CM | POA: Insufficient documentation

## 2021-02-28 DIAGNOSIS — D500 Iron deficiency anemia secondary to blood loss (chronic): Secondary | ICD-10-CM | POA: Insufficient documentation

## 2021-02-28 MED ORDER — SODIUM CHLORIDE 0.9 % IV SOLN: Freq: Once | INTRAVENOUS | Status: AC

## 2021-02-28 MED ORDER — SODIUM CHLORIDE 0.9 % IV SOLN
400.0000 mg | Freq: Once | INTRAVENOUS | Status: AC
  Administered 2021-02-28: 400 mg via INTRAVENOUS
  Filled 2021-02-28: qty 20

## 2021-02-28 MED ORDER — LORATADINE 10 MG PO TABS
10.0000 mg | ORAL_TABLET | Freq: Once | ORAL | Status: AC
  Administered 2021-02-28: 10 mg via ORAL
  Filled 2021-02-28: qty 1

## 2021-02-28 NOTE — Progress Notes (Signed)
Patient presents today for 400 mgs of Venofer iron infusion. Vital signs stable. Patient denies any changes since her last visit. MAR reviewed and updated.

## 2021-02-28 NOTE — Progress Notes (Signed)
Iron infusion given per orders. Patient tolerated it well without problems. Vitals stable and discharged home from clinic ambulatory. Follow up as scheduled.  

## 2021-02-28 NOTE — Patient Instructions (Signed)
[facility]  Discharge Instructions: Thank you for choosing [facility] to provide your oncology and hematology care.  If you have a lab appointment with the Cancer Center, please come in thru the Main Entrance and check in at the main information desk.  Wear comfortable clothing and clothing appropriate for easy access to any Portacath or PICC line.   We strive to give you quality time with your provider. You may need to reschedule your appointment if you arrive late (15 or more minutes).  Arriving late affects you and other patients whose appointments are after yours.  Also, if you miss three or more appointments without notifying the office, you may be dismissed from the clinic at the provider's discretion.      For prescription refill requests, have your pharmacy contact our office and allow 72 hours for refills to be completed.        To help prevent nausea and vomiting after your treatment, we encourage you to take your nausea medication as directed.  BELOW ARE SYMPTOMS THAT SHOULD BE REPORTED IMMEDIATELY: *FEVER GREATER THAN 100.4 F (38 C) OR HIGHER *CHILLS OR SWEATING *NAUSEA AND VOMITING THAT IS NOT CONTROLLED WITH YOUR NAUSEA MEDICATION *UNUSUAL SHORTNESS OF BREATH *UNUSUAL BRUISING OR BLEEDING *URINARY PROBLEMS (pain or burning when urinating, or frequent urination) *BOWEL PROBLEMS (unusual diarrhea, constipation, pain near the anus) TENDERNESS IN MOUTH AND THROAT WITH OR WITHOUT PRESENCE OF ULCERS (sore throat, sores in mouth, or a toothache) UNUSUAL RASH, SWELLING OR PAIN  UNUSUAL VAGINAL DISCHARGE OR ITCHING   Items with * indicate a potential emergency and should be followed up as soon as possible or go to the Emergency Department if any problems should occur.  Please show the CHEMOTHERAPY ALERT CARD or IMMUNOTHERAPY ALERT CARD at check-in to the Emergency Department and triage nurse.  Should you have questions after your visit or need to cancel  or reschedule your appointment, please contact [facility] [phone]  and follow the prompts.  Office hours are 8:00 a.m. to 4:30 p.m. Monday - Friday. Please note that voicemails left after 4:00 p.m. may not be returned until the following business day.  We are closed weekends and major holidays. You have access to a nurse at all times for urgent questions. Please call the main number to the clinic [phone] and follow the prompts.  For any non-urgent questions, you may also contact your provider using MyChart. We now offer e-Visits for anyone 18 and older to request care online for non-urgent symptoms. For details visit [email or web address].   Also download the MyChart app! Go to the app store, search "MyChart", open the app, select [place], and log in with your MyChart username and password.  Due to Covid, a mask is required upon entering the hospital/clinic. If you do not have a mask, one will be given to you upon arrival. For doctor visits, patients may have 1 support person aged 18 or older with them. For treatment visits, patients cannot have anyone with them due to current Covid guidelines and our immunocompromised population.  

## 2021-03-28 ENCOUNTER — Ambulatory Visit: Payer: Medicare HMO | Admitting: Nurse Practitioner

## 2021-03-28 ENCOUNTER — Encounter: Payer: Self-pay | Admitting: Nurse Practitioner

## 2021-03-28 ENCOUNTER — Ambulatory Visit (HOSPITAL_COMMUNITY)
Admission: RE | Admit: 2021-03-28 | Discharge: 2021-03-28 | Disposition: A | Discharge status: Home / Self Care | Payer: Medicare HMO | Source: Ambulatory Visit | Attending: Family Medicine | Admitting: Family Medicine

## 2021-03-28 ENCOUNTER — Other Ambulatory Visit: Payer: Self-pay

## 2021-03-28 DIAGNOSIS — Z8673 Personal history of transient ischemic attack (TIA), and cerebral infarction without residual deficits: Secondary | ICD-10-CM | POA: Diagnosis present

## 2021-03-28 DIAGNOSIS — I3481 Nonrheumatic mitral (valve) annulus calcification: Secondary | ICD-10-CM | POA: Insufficient documentation

## 2021-03-28 DIAGNOSIS — I10 Essential (primary) hypertension: Secondary | ICD-10-CM | POA: Insufficient documentation

## 2021-03-28 DIAGNOSIS — E669 Obesity, unspecified: Secondary | ICD-10-CM | POA: Diagnosis not present

## 2021-03-28 DIAGNOSIS — J449 Chronic obstructive pulmonary disease, unspecified: Secondary | ICD-10-CM | POA: Diagnosis not present

## 2021-03-28 DIAGNOSIS — E785 Hyperlipidemia, unspecified: Secondary | ICD-10-CM | POA: Insufficient documentation

## 2021-03-28 DIAGNOSIS — I447 Left bundle-branch block, unspecified: Secondary | ICD-10-CM | POA: Insufficient documentation

## 2021-03-28 LAB — ECHOCARDIOGRAM COMPLETE
Area-P 1/2: 4.89 cm2
S' Lateral: 3.7 cm

## 2021-03-28 NOTE — Progress Notes (Signed)
*  PRELIMINARY RESULTS* Echocardiogram 2D Echocardiogram has been performed.  Deanna Thompson 03/28/2021, 1:40 PM

## 2021-04-07 ENCOUNTER — Telehealth: Payer: Self-pay | Admitting: Orthopedic Surgery
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
## 2021-04-08 NOTE — Telephone Encounter (Signed)
Noted; appointment pending receipt and review of notes and CD.

## 2021-04-10 ENCOUNTER — Ambulatory Visit (INDEPENDENT_AMBULATORY_CARE_PROVIDER_SITE_OTHER): Payer: Medicare HMO

## 2021-04-10 ENCOUNTER — Ambulatory Visit
Admission: EM | Admit: 2021-04-10 | Discharge: 2021-04-10 | Disposition: A | Discharge status: Home / Self Care | Payer: Medicare HMO | Attending: Urgent Care | Admitting: Urgent Care

## 2021-04-10 DIAGNOSIS — R0789 Other chest pain: Secondary | ICD-10-CM | POA: Diagnosis not present

## 2021-04-10 DIAGNOSIS — T148XXA Other injury of unspecified body region, initial encounter: Secondary | ICD-10-CM | POA: Diagnosis not present

## 2021-04-10 DIAGNOSIS — K7460 Unspecified cirrhosis of liver: Secondary | ICD-10-CM

## 2021-04-10 DIAGNOSIS — N189 Chronic kidney disease, unspecified: Secondary | ICD-10-CM | POA: Diagnosis not present

## 2021-04-10 DIAGNOSIS — Z23 Encounter for immunization: Secondary | ICD-10-CM

## 2021-04-10 DIAGNOSIS — L089 Local infection of the skin and subcutaneous tissue, unspecified: Secondary | ICD-10-CM | POA: Diagnosis not present

## 2021-04-10 MED ORDER — BACITRACIN ZINC 500 UNIT/GM EX OINT: 1.0000 "application " | TOPICAL_OINTMENT | Freq: Two times a day (BID) | CUTANEOUS | 0 refills | Status: DC

## 2021-04-10 MED ORDER — TIZANIDINE HCL 4 MG PO TABS
4.0000 mg | ORAL_TABLET | Freq: Every day | ORAL | 0 refills | Status: DC
Start: 2021-04-10 — End: 2021-05-28

## 2021-04-10 MED ORDER — TIZANIDINE HCL 4 MG PO TABS: 4.0000 mg | ORAL_TABLET | Freq: Every day | ORAL | 0 refills | Status: DC

## 2021-04-10 MED ORDER — DOXYCYCLINE HYCLATE 100 MG PO CAPS: 100.0000 mg | ORAL_CAPSULE | Freq: Two times a day (BID) | ORAL | 0 refills | Status: DC
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
## 2021-04-10 NOTE — Discharge Instructions (Signed)
Please change your dressing 2-3 times daily. Use non-stick gauze. Apply Bacitracin ointment.  Each time you change your dressing, make sure you clean gently around the perimeter of the wound and the wound itself with gentle soap and warm water. Pat your wound dry and let it air out if possible for 1-2 hours before reapplying another dressing. Once your wound scabs over completely, you do not need to keep applying dressings.

## 2021-04-10 NOTE — Telephone Encounter (Signed)
Patient returned call; states CD in mail per facility in [place], [place].

## 2021-04-10 NOTE — Telephone Encounter (Signed)
I followed up with patient; left message.* Patient called back; I was on another line; she elected to leave message with front office staff for me to call back(3:23pm). I called back at 3:33pm and reached voice mail. Left another message.

## 2021-04-11 ENCOUNTER — Telehealth: Payer: Self-pay | Admitting: Family Medicine

## 2021-04-11 NOTE — Telephone Encounter (Signed)
°  Left message for patient to call back and schedule Medicare Annual Wellness Visit (AWV) in office.   If unable to come into the office for AWV,  please offer to do virtually or by telephone.  No hx of AWV eligible for AWVI as of 11/28/2020 per [facility]  Please schedule at anytime with [facility] Health Advisor.      40 Minutes appointment   Any questions, please call me at [phone]

## 2021-04-15 ENCOUNTER — Other Ambulatory Visit: Payer: Self-pay | Admitting: Family Medicine

## 2021-04-15 ENCOUNTER — Ambulatory Visit: Payer: Medicare HMO | Admitting: Family Medicine

## 2021-04-15 DIAGNOSIS — I10 Essential (primary) hypertension: Secondary | ICD-10-CM

## 2021-04-16 ENCOUNTER — Other Ambulatory Visit (HOSPITAL_COMMUNITY): Payer: Self-pay | Admitting: Family Medicine

## 2021-04-16 ENCOUNTER — Ambulatory Visit: Payer: Medicare HMO | Admitting: Family Medicine

## 2021-04-16 ENCOUNTER — Other Ambulatory Visit: Payer: Self-pay

## 2021-04-16 VITALS — BP 124/91 | HR 91 | Temp 98.2°F | Ht 68.0 in | Wt 261.2 lb

## 2021-04-16 DIAGNOSIS — S299XXD Unspecified injury of thorax, subsequent encounter: Secondary | ICD-10-CM

## 2021-04-16 DIAGNOSIS — R0989 Other specified symptoms and signs involving the circulatory and respiratory systems: Secondary | ICD-10-CM
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
## 2021-04-16 NOTE — Assessment & Plan Note (Signed)
Could not palpate dorsalis pedis pulse of her right foot on exam or detect with Doppler.  Given recent injury and patient's comorbidities, sending for ultrasound for further evaluation.

## 2021-04-16 NOTE — Patient Instructions (Addendum)
Sending for [place].  Discuss drainage of hematoma with Ortho.  Follow up 6 months  Take care  Dr. Lacinda Axon
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
## 2021-04-17 ENCOUNTER — Ambulatory Visit: Payer: Medicare HMO | Admitting: Orthopaedic Surgery

## 2021-04-17 ENCOUNTER — Encounter: Payer: Self-pay | Admitting: Orthopaedic Surgery

## 2021-04-17 ENCOUNTER — Ambulatory Visit (HOSPITAL_COMMUNITY)
Admission: RE | Admit: 2021-04-17 | Discharge: 2021-04-17 | Disposition: A | Discharge status: Home / Self Care | Payer: Medicare HMO | Source: Ambulatory Visit | Attending: Family Medicine | Admitting: Family Medicine

## 2021-04-17 ENCOUNTER — Ambulatory Visit: Payer: Medicare HMO

## 2021-04-17 VITALS — BP 153/86 | HR 83 | Ht 68.0 in | Wt 261.0 lb

## 2021-04-17 DIAGNOSIS — S8012XA Contusion of left lower leg, initial encounter: Secondary | ICD-10-CM | POA: Diagnosis not present

## 2021-04-17 DIAGNOSIS — S96911A Strain of unspecified muscle and tendon at ankle and foot level, right foot, initial encounter: Secondary | ICD-10-CM | POA: Diagnosis not present

## 2021-04-17 DIAGNOSIS — R0989 Other specified symptoms and signs involving the circulatory and respiratory systems: Secondary | ICD-10-CM | POA: Diagnosis not present

## 2021-04-17 DIAGNOSIS — M25571 Pain in right ankle and joints of right foot: Secondary | ICD-10-CM

## 2021-04-17 NOTE — Progress Notes (Signed)
ABI has been completed.   Preliminary results in CV Proc.   Deanna Thompson 04/17/2021 2:01 PM
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
## 2021-04-21 ENCOUNTER — Other Ambulatory Visit: Payer: Self-pay

## 2021-04-21 ENCOUNTER — Encounter (HOSPITAL_BASED_OUTPATIENT_CLINIC_OR_DEPARTMENT_OTHER): Payer: Medicare HMO | Attending: Internal Medicine | Admitting: Internal Medicine

## 2021-04-21 DIAGNOSIS — F319 Bipolar disorder, unspecified: Secondary | ICD-10-CM | POA: Diagnosis not present

## 2021-04-21 DIAGNOSIS — L97812 Non-pressure chronic ulcer of other part of right lower leg with fat layer exposed: Secondary | ICD-10-CM | POA: Insufficient documentation

## 2021-04-21 DIAGNOSIS — I10 Essential (primary) hypertension: Secondary | ICD-10-CM | POA: Diagnosis not present

## 2021-04-21 DIAGNOSIS — T798XXA Other early complications of trauma, initial encounter: Secondary | ICD-10-CM | POA: Diagnosis not present

## 2021-04-21 DIAGNOSIS — L98422 Non-pressure chronic ulcer of back with fat layer exposed: Secondary | ICD-10-CM | POA: Insufficient documentation

## 2021-04-21 DIAGNOSIS — Z87891 Personal history of nicotine dependence: Secondary | ICD-10-CM | POA: Diagnosis not present

## 2021-04-21 DIAGNOSIS — K7460 Unspecified cirrhosis of liver: Secondary | ICD-10-CM | POA: Insufficient documentation

## 2021-04-21 DIAGNOSIS — L4050 Arthropathic psoriasis, unspecified: Secondary | ICD-10-CM | POA: Diagnosis not present

## 2021-04-21 NOTE — Progress Notes (Signed)
Deanna Thompson Thompson ([id number]) Visit Report for 04/21/2021 Abuse Risk Screen Details Patient Name: Date of Service: Deanna Thompson Thompson 04/21/2021 7:30 A M Medical Record Number: [id number] Patient Account Number: [id number] Date of Birth/Sex: Treating RN: Sep 16, 1955 (66 y.o. Deanna Thompson Thompson Primary Care Mulan Adan: [place] Brayton El Other Clinician: Referring Demon Volante: Treating Jamal Pavon/Extender: Phillip Heal Weeks in Treatment: 0 Abuse Risk Screen Items Answer ABUSE RISK SCREEN: Has anyone close to you tried to hurt or harm you recentlyo No Do you feel uncomfortable with anyone in your familyo No Has anyone forced you do things that you didnt want to doo No Electronic Signature(s) Signed: 04/21/2021 5:00:04 PM By: Deon Pilling RN, BSN Entered By: Deon Pilling on 04/21/2021 08:12:35 -------------------------------------------------------------------------------- Activities of Daily Living Details Patient Name: Date of Service: Deanna Thompson Thompson 04/21/2021 7:30 A M Medical Record Number: [id number] Patient Account Number: [id number] Date of Birth/Sex: Treating RN: 28-Aug-1955 (66 y.o. Deanna Thompson Thompson Primary Care Ernestene Coover: [place] Brayton El Other Clinician: Referring Zakariya Knickerbocker: Treating Ahtziry Saathoff/Extender: Phillip Heal Weeks in Treatment: 0 Activities of Daily Living Items Answer Activities of Daily Living (Please select one for each item) Drive Automobile Not Able T Medications ake Completely Able Use T elephone Completely Able Care for Appearance Completely Able Use T oilet Completely Able Bath / Shower Completely Able Dress Self Completely Able Feed Self Completely Able Walk Completely Able Get In / Out Bed Completely Able Housework Completely Able Prepare Meals Completely [place] Completely Able Shop for Self Completely Able Electronic Signature(s) Signed: 04/21/2021 5:00:04 PM By: Deon Pilling RN, BSN Entered By: Deon Pilling on 04/21/2021 07:53:52 -------------------------------------------------------------------------------- Education Screening Details Patient Name: Date of Service: Deanna Thompson Thompson 04/21/2021 7:30 A M Medical Record Number: [id number] Patient Account Number: [id number] Date of Birth/Sex: Treating RN: [birth year] (66 y.o. Deanna Thompson Thompson Primary Care Maxie Slovacek: [place] Brayton El Other Clinician: Referring Noel Rodier: Treating Han Vejar/Extender: Phillip Heal Weeks in Treatment: 0 Primary Learner Assessed: Patient Learning Preferences/Education Level/Primary Language Learning Preference: Explanation, Demonstration, Printed Material Highest Education Level: College or Above Preferred Language: English Community education officer Language Barrier: No Translator Needed: No Memory Deficit: No Emotional Barrier: No Cultural/Religious Beliefs Affecting Medical Care: No Physical Barrier Impaired Vision: Yes Glasses Impaired Hearing: No Decreased Hand dexterity: No Knowledge/Comprehension Knowledge Level: High Comprehension Level: High Ability to understand written instructions: High Ability to understand verbal instructions: High Motivation Anxiety Level: Calm Cooperation: Cooperative Education Importance: Acknowledges Need Interest in Health Problems: Asks Questions Perception: Coherent Willingness to Engage in Self-Management High Activities: Readiness to Engage in Self-Management High Activities: Electronic Signature(s) Signed: 04/21/2021 5:00:04 PM By: Deon Pilling RN, BSN Entered By: Deon Pilling on 04/21/2021 07:54:11 -------------------------------------------------------------------------------- Fall Risk Assessment Details Patient Name: Date of Service: [name] 04/21/2021 7:30 [place] Record Number: [id number] Patient Account Number: [id number] Date of Birth/Sex: Treating RN: 07-14-1955 (66 y.o. Deanna Thompson Thompson, Meta.Reding Primary Care Siah Kannan: [place]  Brayton El Other Clinician: Referring Kwaku Mostafa: Treating Kai Railsback/Extender: Phillip Heal Weeks in Treatment: 0 Fall Risk Assessment Items Have you had 2 or more falls in the last 12 monthso 0 No Have you had any fall that resulted in injury in the last 12 monthso 0 No FALLS RISK SCREEN History of falling - immediate or within 3 months 0 No Secondary diagnosis (Do you have 2 or more medical diagnoseso) 0 No  Ambulatory aid None/bed rest/wheelchair/nurse 0 Yes Crutches/cane/walker 0 No Furniture 0 No Intravenous therapy Access/Saline/Heparin Lock 0 No Gait/Transferring Normal/ bed rest/ wheelchair 0 Yes Weak (short steps with or without shuffle, stooped but able to lift head while walking, may seek 0 No support from furniture) Impaired (short steps with shuffle, may have difficulty arising from chair, head down, impaired 0 No balance) Mental Status Oriented to own ability 0 Yes Electronic Signature(s) Signed: 04/21/2021 5:00:04 PM By: Deon Pilling RN, BSN Entered By: Deon Pilling on 04/21/2021 07:54:43 -------------------------------------------------------------------------------- Foot Assessment Details Patient Name: Date of Service: Deanna Thompson Thompson 04/21/2021 7:30 [place] Record Number: [id number] Patient Account Number: [id number] Date of Birth/Sex: Treating RN: 12/19/1955 (66 y.o. Deanna Thompson) Deanna Thompson Thompson, Meta.Reding Primary Care Tanzie Rothschild: [place] Brayton El Other Clinician: Referring Xavior Niazi: Treating Caleesi Kohl/Extender: Phillip Heal Weeks in Treatment: 0 Foot Assessment Items Site Locations + = Sensation present, - = Sensation absent, C = Callus, U = Ulcer R = Redness, W = Warmth, M = Maceration, PU = Pre-ulcerative lesion Deanna Thompson = Fissure, S = Swelling, D = Dryness Assessment Right: Left: Other Deformity: No No Prior Foot Ulcer: No No Prior Amputation: No No Charcot Joint: No No Ambulatory Status: Ambulatory Without Help Gait:  Steady Notes walking boot to right foot. Electronic Signature(s) Signed: 04/21/2021 5:00:04 PM By: Deon Pilling RN, BSN Entered By: Deon Pilling on 04/21/2021 08:18:53 -------------------------------------------------------------------------------- Nutrition Risk Screening Details Patient Name: Date of Service: Deanna Thompson Thompson 04/21/2021 7:30 A M Medical Record Number: [id number] Patient Account Number: [id number] Date of Birth/Sex: Treating RN: 12/07/1955 (66 y.o. Deanna Thompson Thompson, Meta.Reding Primary Care Bakary Bramer: [place] Brayton El Other Clinician: Referring Aunica Dauphinee: Treating Jissell Trafton/Extender: Erroll Luna, JA YCE Weeks in Treatment: 0 Height (in): 68 Weight (lbs): 260 Body Mass Index (BMI): 39.5 Nutrition Risk Screening Items Score Screening NUTRITION RISK SCREEN: I have an illness or condition that made me change the kind and/or amount of food I eat 2 Yes I eat fewer than two meals per day 0 No I eat few fruits and vegetables, or milk products 0 No I have three or more drinks of beer, liquor or wine almost every day 0 No I have tooth or mouth problems that make it hard for me to eat 0 No I don't always have enough money to buy the food I need 0 No I eat alone most of the time 0 No I take three or more different prescribed or over-the-counter drugs a day 1 Yes Without wanting to, I have lost or gained 10 pounds in the last six months 0 No I am not always physically able to shop, cook and/or feed myself 0 No Nutrition Protocols Good Risk Protocol Moderate Risk Protocol 0 Provide education on nutrition High Risk Proctocol Risk Level: Moderate Risk Score: 3 Electronic Signature(s) Signed: 04/21/2021 5:00:04 PM By: Deon Pilling RN, BSN Entered By: Deon Pilling on 04/21/2021 07:54:52

## 2021-04-21 NOTE — Progress Notes (Signed)
SHALENE, GALLEN ([id number]) Visit Report for 04/21/2021 Allergy List Details Patient Name: Date of Service: Deanna Thompson 04/21/2021 7:30 A M Medical Record Number: [id number] Patient Account Number: [id number] Date of Birth/Sex: Treating RN: Nov 30, 1955 (66 y.o. Deanna Thompson Primary Care : [place] [facility] Other Clinician: Referring : Treating /Extender: Phillip Heal Weeks in Treatment: 0 Allergies Active Allergies omeprazole Reaction: SHOB Orencia Reaction: anaphylaxis phenytoin Reaction: rash Keflex Reaction: yeast infection promethazine Reaction: hallucination Allergy Notes Lidocaine listed as an allergy- prolonged numbness- patient states," That happened once with dentist injected last a long time 3 days of numbness, but I am not allergic. I use topical lidocaine at home." patient in agreement for staff to use topical lidocaine or benzocaine. Electronic Signature(s) Signed: 04/21/2021 5:00:04 PM By: Deon Pilling RN, BSN Entered By: Deon Pilling on 04/21/2021 08:12:26 -------------------------------------------------------------------------------- Arrival Information Details Patient Name: Date of Service: Deanna Thompson 04/21/2021 7:30 A M Medical Record Number: [id number] Patient Account Number: [id number] Date of Birth/Sex: Treating RN: 12-Oct-1955 (66 y.o. Deanna Thompson Primary Care : [place] [facility] [facility] Other Clinician: Referring : Treating /Extender: Phillip Heal Weeks in Treatment: 0 Visit Information Patient Arrived: Ambulatory Arrival Time: 07:44 Accompanied By: self Transfer Assistance: None Patient Identification Verified: Yes Secondary Verification Process Completed: Yes Patient Requires Transmission-Based Precautions: No Patient Has Alerts: Yes Patient Alerts: 04/17/21 ABI R 1.10 04/17/21 TBI R 0.62 Notes hematoma to left knee-skin intact. Electronic  Signature(s) Signed: 04/21/2021 5:00:04 PM By: Deon Pilling RN, BSN Entered By: Deon Pilling on 04/21/2021 08:17:21 -------------------------------------------------------------------------------- Clinic Level of Care Assessment Details Patient Name: Date of Service: Deanna Thompson 04/21/2021 7:30 A M Medical Record Number: [id number] Patient Account Number: [id number] Date of Birth/Sex: Treating RN: September 10, 1955 (66 y.o. Deanna Thompson Primary Care : [place] Other Clinician: Referring : Treating /Extender: Erroll Luna, JA YCE Weeks in Treatment: 0 Clinic Level of Care Assessment Items TOOL 1 Quantity Score X- 1 0 Use when EandM and Procedure is performed on INITIAL visit ASSESSMENTS - Nursing Assessment / Reassessment X- 1 20 General Physical Exam (combine w/ comprehensive assessment (listed just below) when performed on new pt. evals) X- 1 25 Comprehensive Assessment (HX, ROS, Risk Assessments, Wounds Hx, etc.) ASSESSMENTS - Wound and Skin Assessment / Reassessment X- 1 10 Dermatologic / Skin Assessment (not related to wound area) ASSESSMENTS - Ostomy and/or Continence Assessment and Care [] - 0 Incontinence Assessment and Management [] - 0 Ostomy Care Assessment and Management (repouching, etc.) PROCESS - Coordination of Care [] - 0 Simple Patient / Family Education for ongoing care X- 1 20 Complex (extensive) Patient / Family Education for ongoing care X- 1 10 Staff obtains Programmer, systems, Records, T Results / Process Orders est [] - 0 Staff telephones HHA, Nursing Homes / Clarify orders / etc [] - 0 Routine Transfer to another Facility (non-emergent condition) [] - 0 Routine Hospital Admission (non-emergent condition) X- 1 15 New Admissions / Biomedical engineer / Ordering NPWT Apligraf, etc. , [] - 0 Emergency Hospital Admission (emergent condition) PROCESS - Special Needs [] - 0 Pediatric / Minor Patient  Management [] - 0 Isolation Patient Management [] - 0 Hearing / Language / Visual special needs [] - 0 Assessment of Community assistance (transportation, D/C planning, etc.) [] - 0 Additional assistance / Altered mentation [] - 0 Support Surface(s) Assessment (bed, cushion, seat, etc.)  INTERVENTIONS - Miscellaneous [] - 0 External ear exam [] - 0 Patient Transfer (multiple staff / Civil Service fast streamer / Similar devices) [] - 0 Simple Staple / Suture removal (25 or less) [] - 0 Complex Staple / Suture removal (26 or more) [] - 0 Hypo/Hyperglycemic Management (do not check if billed separately) [] - 0 Ankle / Brachial Index (ABI) - do not check if billed separately Has the patient been seen at the hospital within the last three years: Yes Total Score: 100 Level Of Care: New/Established - Level 3 Electronic Signature(s) Signed: 04/21/2021 5:00:04 PM By: Deon Pilling RN, BSN Entered By: Deon Pilling on 04/21/2021 08:54:36 -------------------------------------------------------------------------------- Encounter Discharge Information Details Patient Name: Date of Service: Deanna Thompson [facility] 04/21/2021 7:30 A M Medical Record Number: [id number] Patient Account Number: [id number] Date of Birth/Sex: Treating RN: Aug 19, 1955 (66 y.o. Deanna Thompson Primary Care Dewaine Morocho: [place] Other Clinician: Referring Camylle Whicker: Treating Rockey Guarino/Extender: Phillip Heal Weeks in Treatment: 0 Encounter Discharge Information Items Post Procedure Vitals Discharge Condition: Stable Temperature (F): 98.9 Ambulatory Status: Ambulatory Pulse (bpm): 87 Discharge Destination: Home Respiratory Rate (breaths/min): 20 Transportation: Private Auto Blood Pressure (mmHg): 101/65 Accompanied By: self Schedule Follow-up Appointment: Yes Clinical Summary of Care: Electronic Signature(s) Signed: 04/21/2021 5:00:04 PM By: Deon Pilling RN, BSN Entered By: Deon Pilling on 04/21/2021  08:55:37 -------------------------------------------------------------------------------- Lower Extremity Assessment Details Patient Name: Date of Service: Deanna Thompson 04/21/2021 7:30 A M Medical Record Number: [id number] Patient Account Number: [id number] Date of Birth/Sex: Treating RN: [birth year] (66 y.o. Deanna Thompson Primary Care Alissandra Geoffroy: [place] Other Clinician: Referring Gerrald Basu: Treating Tarryn Bogdan/Extender: Phillip Heal Weeks in Treatment: 0 Edema Assessment Assessed: [Left: No] [Right: Yes] Edema: [Left: Ye] [Right: s] Calf Left: Right: Point of Measurement: 35 cm From Medial Instep 35 cm Ankle Left: Right: Point of Measurement: 11 cm From Medial Instep 21 cm Knee To Floor Left: Right: From Medial Instep 39 cm Vascular Assessment Pulses: Dorsalis Pedis Palpable: [Right:Yes] Posterior Tibial Palpable: [Right:Yes] Electronic Signature(s) Signed: 04/21/2021 5:00:04 PM By: Deon Pilling RN, BSN Entered By: Deon Pilling on 04/21/2021 08:14:28 -------------------------------------------------------------------------------- Multi Wound Chart Details Patient Name: Date of Service: Deanna Thompson 04/21/2021 7:30 A M Medical Record Number: [id number] Patient Account Number: [id number] Date of Birth/Sex: Treating RN: 12-Oct-1955 (66 y.o. Deanna Thompson Primary Care Adna Nofziger: [place] Other Clinician: Referring Donnette Macmullen: Treating Aylen Rambert/Extender: Erroll Luna, JA YCE Weeks in Treatment: 0 Vital Signs Height(in): 68 Pulse(bpm): 87 Weight(lbs): 260 Blood Pressure(mmHg): 101/65 Body Mass Index(BMI): 39.5 Temperature(F): 98.9 Respiratory Rate(breaths/min): 20 Photos: [N/A:N/A] Right, Lateral Lower Leg Right Back N/A Wound Location: Trauma Surgical Injury N/A Wounding Event: Trauma, Other Dehisced Wound N/A Primary Etiology: Anemia, Hypertension, Cirrhosis , Anemia, Hypertension, Cirrhosis ,  N/A Comorbid History: Seizure Disorder Seizure Disorder 04/04/2021 04/04/2021 N/A Date Acquired: 0 0 N/A Weeks of Treatment: Open Open N/A Wound Status: No No N/A Wound Recurrence: Yes No N/A Clustered Wound: 2 N/A N/A Clustered Quantity: 7.5x1.3x0.1 1.2x0.4x0.1 N/A Measurements L x W x D (cm) 7.658 0.377 N/A A (cm) : rea 0.766 0.038 N/A Volume (cm) : 0.00% 0.00% N/A % Reduction in Area: 0.00% 0.00% N/A % Reduction in Volume: Full Thickness Without Exposed Full Thickness Without Exposed N/A Classification: Support Structures Support Structures Medium Medium N/A Exudate Amount: Serosanguineous Serosanguineous N/A Exudate Type: red, brown red, brown N/A Exudate Color: Distinct, outline attached  Distinct, outline attached N/A Wound Margin: Small (1-33%) Medium (34-66%) N/A Granulation Amount: Pink Red, Pink N/A Granulation Quality: Large (67-100%) Medium (34-66%) N/A Necrotic Amount: Eschar, Adherent Slough Eschar N/A Necrotic Tissue: Fat Layer (Subcutaneous Tissue): Yes Fat Layer (Subcutaneous Tissue): Yes N/A Exposed Structures: Fascia: No Fascia: No Tendon: No Tendon: No Muscle: No Muscle: No Joint: No Joint: No Bone: No Bone: No Small (1-33%) Medium (34-66%) N/A Epithelialization: Debridement - Excisional Debridement - Excisional N/A Debridement: Pre-procedure Verification/Time Out 08:35 08:35 N/A Taken: Other Other N/A Pain Control: Necrotic/Eschar, Subcutaneous, Subcutaneous, Slough N/A Tissue Debrided: Slough Skin/Subcutaneous Tissue Skin/Subcutaneous Tissue N/A Level: 7.8 0.48 N/A Debridement A (sq cm): rea Curette Curette N/A Instrument: Minimum Minimum N/A Bleeding: Pressure Pressure N/A Hemostasis Achieved: 0 0 N/A Procedural Pain: 3 3 N/A Post Procedural Pain: Debridement Treatment Response: Procedure was tolerated well Procedure was tolerated well N/A Post Debridement Measurements L x 7.5x1.3x0.1 1.2x0.4x0.1 N/A W x D  (cm) 0.766 0.038 N/A Post Debridement Volume: (cm) Debridement Debridement N/A Procedures Performed: Treatment Notes Wound #1 (Lower Leg) Wound Laterality: Right, Lateral Cleanser Normal Saline Discharge Instruction: Cleanse the wound with Normal Saline prior to applying a clean dressing using gauze sponges, not tissue or cotton balls. Soap and Water Discharge Instruction: May shower and wash wound with dial antibacterial soap and water prior to dressing change. Peri-Wound Care Skin Prep Discharge Instruction: Use skin prep as directed Topical Primary Dressing Hydrofera Blue Classic Foam, 4x4 in Discharge Instruction: Moisten with saline prior to apply over the santyl. Santyl Ointment Discharge Instruction: Apply nickel thick amount to wound bed under the hydrofera blue. Secondary Dressing Bordered Gauze, 6x6 (in/in) Discharge Instruction: Apply over primary dressing as directed. Secured With Compression Wrap Compression Stockings Add-Ons Wound #2 (Back) Wound Laterality: Right Cleanser Normal Saline Discharge Instruction: Cleanse the wound with Normal Saline prior to applying a clean dressing using gauze sponges, not tissue or cotton balls. Soap and Water Discharge Instruction: May shower and wash wound with dial antibacterial soap and water prior to dressing change. Peri-Wound Care Skin Prep Discharge Instruction: Use skin prep as directed Topical Primary Dressing Promogran Prisma Matrix, 4.34 (sq in) (silver collagen) Discharge Instruction: Moisten collagen with saline or hydrogel Secondary Dressing Zetuvit Plus Silicone Border Dressing 4x4 (in/in) Discharge Instruction: Apply silicone border over primary dressing as directed. Secured With Compression Wrap Compression Stockings Add-Ons Electronic Signature(s) Signed: 04/21/2021 9:40:02 AM By: Kalman Shan DO Signed: 04/21/2021 3:38:08 PM By: Lorrin Jackson Entered By: Kalman Shan on 04/21/2021  09:29:48 -------------------------------------------------------------------------------- Multi-Disciplinary Care Plan Details Patient Name: Date of Service: Deanna Thompson [facility] 04/21/2021 7:30 A M Medical Record Number: [id number] Patient Account Number: [id number] Date of Birth/Sex: Treating RN: [birth year] (66 y.o. Deanna Thompson Primary Care Wenda Vanschaick: [place] Other Clinician: Referring Lillian Tigges: Treating Nayah Lukens/Extender: Phillip Heal Weeks in Treatment: 0 Active Inactive Nutrition Nursing Diagnoses: Potential for alteratiion in Nutrition/Potential for imbalanced nutrition Goals: Patient/caregiver agrees to and verbalizes understanding of need to obtain nutritional consultation Date Initiated: 04/21/2021 Target Resolution Date: 05/02/2021 Goal Status: Active Interventions: Assess patient nutrition upon admission and as needed per policy Provide education on nutrition Treatment Activities: Patient referred to Primary Care Physician for further nutritional evaluation : 04/21/2021 Notes: Orientation to the Wound Care Program Nursing Diagnoses: Knowledge deficit related to the wound healing center program Goals: Patient/caregiver will verbalize understanding of the [place] Program Date Initiated: 04/21/2021 Target Resolution Date: 05/02/2021 Goal Status: Active Interventions: Provide education on orientation  to the wound center Notes: Pain, Acute or Chronic Nursing Diagnoses: Pain, acute or chronic: actual or potential Potential alteration in comfort, pain Goals: Patient will verbalize adequate pain control and receive pain control interventions during procedures as needed Date Initiated: 04/21/2021 Target Resolution Date: 05/02/2021 Goal Status: Active Patient/caregiver will verbalize comfort level met Date Initiated: 04/21/2021 Target Resolution Date: 05/02/2021 Goal Status: Active Interventions: Encourage patient to take pain  medications as prescribed Provide education on pain management Notes: Wound/Skin Impairment Nursing Diagnoses: Knowledge deficit related to ulceration/compromised skin integrity Goals: Patient/caregiver will verbalize understanding of skin care regimen Date Initiated: 04/21/2021 Target Resolution Date: 05/02/2021 Goal Status: Active Interventions: Assess patient/caregiver ability to perform ulcer/skin care regimen upon admission and as needed Assess ulceration(s) every visit Provide education on ulcer and skin care Treatment Activities: Skin care regimen initiated : 04/21/2021 Topical wound management initiated : 04/21/2021 Notes: Electronic Signature(s) Signed: 04/21/2021 5:00:04 PM By: Deon Pilling RN, BSN Entered By: Deon Pilling on 04/21/2021 08:40:20 -------------------------------------------------------------------------------- Pain Assessment Details Patient Name: Date of Service: Deanna Thompson 04/21/2021 7:30 A M Medical Record Number: [id number] Patient Account Number: [id number] Date of Birth/Sex: Treating RN: 1955/05/29 (66 y.o. Deanna Thompson Primary Care : [place] Other Clinician: Referring : Treating /Extender: Erroll Luna, JA YCE Weeks in Treatment: 0 Active Problems Location of Pain Severity and Description of Pain Patient Has Paino Yes Site Locations Pain Location: Pain Location: Generalized Pain, Pain in Ulcers Rate the pain. Current Pain Level: 2 Worst Pain Level: 10 Least Pain Level: 0 Tolerable Pain Level: 7 Pain Management and Medication Current Pain Management: Medication: No Cold Application: No Rest: No Massage: No Activity: No T.E.N.S.: No Heat Application: No Leg drop or elevation: No Is the Current Pain Management Adequate: Adequate How does your wound impact your activities of daily livingo Sleep: No Bathing: No Appetite: No Relationship With Others: No Bladder Continence:  No Emotions: No Bowel Continence: No Work: No Toileting: No Drive: No Dressing: No Hobbies: No Engineer, maintenance) Signed: 04/21/2021 5:00:04 PM By: Deon Pilling RN, BSN Entered By: Deon Pilling on 04/21/2021 07:55:36 -------------------------------------------------------------------------------- Patient/Caregiver Education Details Patient Name: Date of Service: Deanna Thompson 1/23/2023andnbsp7:30 A M Medical Record Number: [id number] Patient Account Number: [id number] Date of Birth/Gender: Treating RN: 19-Sep-1955 (66 y.o. Deanna Thompson, Deanna Thompson Primary Care Physician: [place] Other Clinician: Referring Physician: Treating Physician/Extender: Phillip Heal Weeks in Treatment: 0 Education Assessment Education Provided To: Patient Education Topics Provided [place]: o Handouts: Welcome T [facility] [place] o Methods: Explain/Verbal, Printed Responses: Reinforcements needed Wound/Skin Impairment: Handouts: Caring for Your Ulcer, Skin Care Do's and Dont's Methods: Explain/Verbal, Printed Responses: Reinforcements needed Electronic Signature(s) Signed: 04/21/2021 5:00:04 PM By: Deon Pilling RN, BSN Entered By: Deon Pilling on 04/21/2021 08:40:43 -------------------------------------------------------------------------------- Wound Assessment Details Patient Name: Date of Service: Deanna Thompson 04/21/2021 7:30 A M Medical Record Number: [id number] Patient Account Number: [id number] Date of Birth/Sex: Treating RN: March 27, 1956 (66 y.o. Deanna Thompson Primary Care : [place] Other Clinician: Referring : Treating /Extender: Erroll Luna, JA YCE Weeks in Treatment: 0 Wound Status Wound Number: 1 Primary Etiology: Trauma, Other Wound Location: Right, Lateral Lower Leg Wound Status: Open Wounding Event: Trauma Comorbid History: Anemia, Hypertension, Cirrhosis ,  Seizure Disorder Date Acquired: 04/04/2021 Weeks Of Treatment: 0 Clustered Wound: Yes Photos Wound Measurements Length: (cm) 7.5 Width: (  cm) 1.3 Depth: (cm) 0.1 Clustered Quantity: 2 Area: (cm) 7.658 Volume: (cm) 0.766 % Reduction in Area: 0% % Reduction in Volume: 0% Epithelialization: Small (1-33%) Tunneling: No Undermining: No Wound Description Classification: Full Thickness Without Exposed Support Structures Wound Margin: Distinct, outline attached Exudate Amount: Medium Exudate Type: Serosanguineous Exudate Color: red, brown Foul Odor After Cleansing: No Slough/Fibrino Yes Wound Bed Granulation Amount: Small (1-33%) Exposed Structure Granulation Quality: Pink Fascia Exposed: No Necrotic Amount: Large (67-100%) Fat Layer (Subcutaneous Tissue) Exposed: Yes Necrotic Quality: Eschar, Adherent Slough Tendon Exposed: No Muscle Exposed: No Joint Exposed: No Bone Exposed: No Treatment Notes Wound #1 (Lower Leg) Wound Laterality: Right, Lateral Cleanser Normal Saline Discharge Instruction: Cleanse the wound with Normal Saline prior to applying a clean dressing using gauze sponges, not tissue or cotton balls. Soap and Water Discharge Instruction: May shower and wash wound with dial antibacterial soap and water prior to dressing change. Peri-Wound Care Skin Prep Discharge Instruction: Use skin prep as directed Topical Primary Dressing Hydrofera Blue Classic Foam, 4x4 in Discharge Instruction: Moisten with saline prior to apply over the santyl. Santyl Ointment Discharge Instruction: Apply nickel thick amount to wound bed under the hydrofera blue. Secondary Dressing Bordered Gauze, 6x6 (in/in) Discharge Instruction: Apply over primary dressing as directed. Secured With Compression Wrap Compression Stockings Environmental education officer) Signed: 04/21/2021 3:38:08 PM By: Lorrin Jackson Signed: 04/21/2021 5:00:04 PM By: Deon Pilling RN, BSN Entered By: Deon Pilling on 04/21/2021 08:16:13 -------------------------------------------------------------------------------- Wound Assessment Details Patient Name: Date of Service: Deanna Thompson 04/21/2021 7:30 A M Medical Record Number: [id number] Patient Account Number: [id number] Date of Birth/Sex: Treating RN: Jul 18, 1955 (66 y.o. Deanna Thompson Primary Care : [place] Other Clinician: Referring : Treating /Extender: Erroll Luna, JA YCE Weeks in Treatment: 0 Wound Status Wound Number: 2 Primary Dehisced Wound Etiology: Wound Location: Right Back Wound Open Wounding Event: Surgical Injury Status: Date Acquired: 04/04/2021 Notes: per patient dermatologist removed a skin tag from back. per patient Weeks Of Treatment: 0 three weeks ago now scabbed over. Patient requested  to Clustered Wound: No look at the back. Comorbid Anemia, Hypertension, Cirrhosis , Seizure Disorder History: Photos Wound Measurements Length: (cm) 1.2 Width: (cm) 0.4 Depth: (cm) 0.1 Area: (cm) 0.377 Volume: (cm) 0.038 % Reduction in Area: 0% % Reduction in Volume: 0% Epithelialization: Medium (34-66%) Tunneling: No Undermining: No Wound Description Classification: Full Thickness Without Exposed Support Str Wound Margin: Distinct, outline attached Exudate Amount: Medium Exudate Type: Serosanguineous Exudate Color: red, brown uctures Wound Bed Granulation Amount: Medium (34-66%) Exposed Structure Granulation Quality: Red, Pink Fascia Exposed: No Necrotic Amount: Medium (34-66%) Fat Layer (Subcutaneous Tissue) Exposed: Yes Necrotic Quality: Eschar Tendon Exposed: No Muscle Exposed: No Joint Exposed: No Bone Exposed: No Treatment Notes Wound #2 (Back) Wound Laterality: Right Cleanser Normal Saline Discharge Instruction: Cleanse the wound with Normal Saline prior to applying a clean dressing using gauze sponges, not tissue or cotton balls. Soap  and Water Discharge Instruction: May shower and wash wound with dial antibacterial soap and water prior to dressing change. Peri-Wound Care Skin Prep Discharge Instruction: Use skin prep as directed Topical Primary Dressing Promogran Prisma Matrix, 4.34 (sq in) (silver collagen) Discharge Instruction: Moisten collagen with saline or hydrogel Secondary Dressing Zetuvit Plus Silicone Border Dressing 4x4 (in/in) Discharge Instruction: Apply silicone border over primary dressing as directed. Secured With Compression Wrap Compression Stockings Environmental education officer) Signed: 04/21/2021 3:38:08 PM By: Lorrin Jackson Signed: 04/21/2021 5:00:04 PM By:  Deon Pilling RN, BSN Entered By: Deon Pilling on 04/21/2021 08:20:41 -------------------------------------------------------------------------------- Vitals Details Patient Name: Date of Service: Deanna Thompson 04/21/2021 7:30 A M Medical Record Number: [id number] Patient Account Number: [id number] Date of Birth/Sex: Treating RN: March 14, 1956 (66 y.o. Deanna Thompson, Deanna Thompson Primary Care : [place] Other Clinician: Referring : Treating /Extender: Erroll Luna, JA YCE Weeks in Treatment: 0 Vital Signs Time Taken: 07:45 Temperature (F): 98.9 Height (in): 68 Pulse (bpm): 87 Source: Stated Respiratory Rate (breaths/min): 20 Weight (lbs): 260 Blood Pressure (mmHg): 101/65 Source: Stated Reference Range: 80 - 120 mg / dl Body Mass Index (BMI): 39.5 Electronic Signature(s) Signed: 04/21/2021 5:00:04 PM By: Deon Pilling RN, BSN Entered By: Deon Pilling on 04/21/2021 07:50:32

## 2021-04-21 NOTE — Progress Notes (Signed)
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 border over primary dressing as directed. 1. In office sharp debridement 2. Hydrofera Blue and Santyl 3. Collagen 4. Follow-up in 1 week Electronic Signature(s) Signed: 04/21/2021 9:40:02 AM By: Kalman Shan DO Entered By: Kalman Shan on 04/21/2021 09:38:58 -------------------------------------------------------------------------------- HxROS Details Patient Name: Date of Service: Deanna Thompson 04/21/2021 7:30 A M Medical Record Number:  [id number] Patient Account Number: [id number] Date of Birth/Sex: Treating RN: 04/17/1955 (66 y.o. Debby Bud Primary Care Provider: [place] Other Clinician: Referring Provider: Treating Provider/Extender: Phillip Heal Weeks in Treatment: 0 Information Obtained From Patient Chart Constitutional  Symptoms (General Health) Complaints and Symptoms: Negative for: Fatigue; Fever; Chills; Marked Weight Change Medical History: Past Medical History Notes: Stroke 2019 TIA Brain aneurysm Folate deficiency Sjogren's disease Eyes Complaints and Symptoms: Positive for: Glasses / Contacts - glasses Negative for: Dry Eyes; Vision Changes Medical History: Negative for: Cataracts; Glaucoma; Optic Neuritis Ear/Nose/Mouth/Throat Complaints and Symptoms: Negative for: Chronic sinus problems or rhinitis Medical History: Negative for: Chronic sinus problems/congestion; Middle ear problems Respiratory Complaints and Symptoms: Negative for: Chronic or frequent coughs; Shortness of Breath Medical History: Negative for: Aspiration; Asthma; Chronic Obstructive Pulmonary Disease (COPD); Pneumothorax; Sleep Apnea; Tuberculosis Cardiovascular Complaints and Symptoms: Negative for: Chest pain Medical History: Positive for: Hypertension Negative for: Angina; Arrhythmia; Congestive Heart Failure; Coronary Artery Disease; Deep Vein Thrombosis; Hypotension; Myocardial Infarction; Peripheral Arterial Disease; Peripheral Venous Disease; Phlebitis; Vasculitis Gastrointestinal Complaints and Symptoms: Negative for: Frequent diarrhea; Nausea; Vomiting Medical History: Positive for: Cirrhosis Negative for: Colitis; Crohns; Hepatitis A; Hepatitis B; Hepatitis C Past Medical History Notes: ETOH abuse quit 07/2011 Endocrine Complaints and Symptoms: Negative for: Heat/cold intolerance Medical History: Negative for: Type I Diabetes; Type II Diabetes Genitourinary Complaints  and Symptoms: Negative for: Frequent urination Medical History: Negative for: End Stage Renal Disease Integumentary (Skin) Complaints and Symptoms: Positive for: Wounds - right leg Medical History: Negative for: History of Burn Musculoskeletal Complaints and Symptoms: Negative for: Muscle Pain; Muscle Weakness Medical History: Negative for: Gout; Rheumatoid Arthritis; Osteoarthritis; Osteomyelitis Past Medical History Notes: Psoriatic arthritis Neurologic Complaints and Symptoms: Negative for: Numbness/parasthesias Medical History: Positive for: Seizure Disorder - 1997 Negative for: Dementia; Neuropathy; Quadriplegia; Paraplegia Psychiatric Complaints and Symptoms: Negative for: Claustrophobia; Suicidal Medical History: Negative for: Anorexia/bulimia; Confinement Anxiety Hematologic/Lymphatic Medical History: Positive for: Anemia - iron Negative for: Hemophilia; Human Immunodeficiency Virus; Lymphedema; Sickle Cell Disease Immunological Medical History: Negative for: Lupus Erythematosus; Raynauds; Scleroderma Past Medical History Notes: undifferentiated connective tissue disease Oncologic Medical History: Negative for: Received Chemotherapy; Received Radiation Immunizations Pneumococcal Vaccine: Received Pneumococcal Vaccination: Yes Received Pneumococcal Vaccination On or After 60th Birthday: Yes Implantable Devices No devices added Hospitalization / Surgery History Type of Hospitalization/Surgery inguinal and abdominal hernia repair Family and Social History Cancer: No; Diabetes: No; Heart Disease: Yes - Father; Hereditary Spherocytosis: No; Hypertension: No; Kidney Disease: No; Lung Disease: Yes - Father; Seizures: No; Stroke: No; Thyroid Problems: No; Tuberculosis: No; Former smoker - quit 03/31/1995; Marital Status - Married; Alcohol Use: Never; Drug Use: No History; Caffeine Use: Daily; Financial Concerns: No; Food, Clothing or Shelter Needs: No; Support System  Lacking: No; Transportation Concerns: No Electronic Signature(s) Signed: 04/21/2021 9:40:02 AM By: Kalman Shan DO Signed: 04/21/2021 5:00:04 PM By: Deon Pilling RN, BSN Entered By: Deon Pilling on 04/21/2021 08:09:10 -------------------------------------------------------------------------------- SuperBill Details Patient Name: Date of Service: Deanna Thompson 04/21/2021 Medical Record Number: [id number] Patient Account Number: [id number] Date of Birth/Sex: Treating RN: [birth year] (66 y.o. 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 T79.8XXA Other early complications of trauma, initial encounter K74.60 Unspecified cirrhosis of liver Facility Procedures CPT4 Code: 21194174 Description: 99213 - WOUND CARE VISIT-LEV 3 EST PT Modifier: Quantity: 1 CPT4 Code: 08144818 Description: 56314 - DEB SUBQ TISSUE 20 SQ CM/< ICD-10 Diagnosis Description L97.812 Non-pressure chronic ulcer of other  part of right lower leg with fat layer expos 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 Modifier: ed Quantity: 1 Physician Procedures : CPT4 Code Description Modifier [id number] 68616 - WC PHYS LEVEL 4 - NEW PT ICD-10 Diagnosis Description O37.290 Non-pressure chronic ulcer of other part of right lower leg with fat layer exposed L98.422 Non-pressure chronic ulcer of back with fat layer  exposed T79.8XXA Other early complications of trauma, initial encounter K74.60 Unspecified cirrhosis of liver Quantity: 1 : 2111552 11042 - WC PHYS SUBQ TISS 20 SQ CM ICD-10 Diagnosis Description L97.812 Non-pressure chronic ulcer of other part of right lower leg  with fat layer exposed 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

## 2021-04-24 ENCOUNTER — Ambulatory Visit: Payer: Medicare HMO | Admitting: Orthopaedic Surgery

## 2021-04-24 ENCOUNTER — Encounter: Payer: Self-pay | Admitting: Orthopaedic Surgery

## 2021-04-24 ENCOUNTER — Other Ambulatory Visit: Payer: Self-pay

## 2021-04-24 VITALS — BP 151/79 | HR 92 | Ht 68.0 in | Wt 255.0 lb

## 2021-04-24 DIAGNOSIS — S8012XA Contusion of left lower leg, initial encounter: Secondary | ICD-10-CM

## 2021-04-24 DIAGNOSIS — S96911D Strain of unspecified muscle and tendon at ankle and foot level, right foot, subsequent encounter: Secondary | ICD-10-CM

## 2021-04-24 DIAGNOSIS — M25571 Pain in right ankle and joints of right foot: Secondary | ICD-10-CM

## 2021-04-24 NOTE — Progress Notes (Signed)
My swelling is down.  Her right ankle and foot have less swelling and less pain.  She has still lateral swelling and resolving ecchymosis.  She has no new trauma;   The left proximal lower leg hematoma is about the same but less tender.  ROM of the right ankle is full, she has lateral swelling but the edema of the foot and distal tibia is gone. She has resolving ecchymosis.  NV intact.  The hematoma on the left proximal medial leg is about the same size but no redness, no drainage and it is softer.  NV intact.  Encounter Diagnoses  Name Primary?   Acute right ankle pain Yes   Hematoma of leg, left, initial encounter    Ankle strain, right, subsequent encounter    Continue the CAM walker and contrast baths.  Continue heat for the leg hematoma.  Return in two weeks.  Call if any problem.  Precautions discussed.  Electronically Signed Sanjuana Kava, MD 1/26/202310:54 AM

## 2021-04-28 ENCOUNTER — Ambulatory Visit (HOSPITAL_COMMUNITY)
Admission: RE | Admit: 2021-04-28 | Discharge: 2021-04-28 | Disposition: A | Discharge status: Home / Self Care | Payer: Medicare HMO | Source: Ambulatory Visit | Attending: Family Medicine | Admitting: Family Medicine

## 2021-04-28 ENCOUNTER — Encounter (HOSPITAL_BASED_OUTPATIENT_CLINIC_OR_DEPARTMENT_OTHER): Payer: Medicare HMO | Admitting: Internal Medicine

## 2021-04-28 ENCOUNTER — Other Ambulatory Visit: Payer: Self-pay

## 2021-04-28 DIAGNOSIS — L97812 Non-pressure chronic ulcer of other part of right lower leg with fat layer exposed: Secondary | ICD-10-CM

## 2021-04-28 DIAGNOSIS — T798XXA Other early complications of trauma, initial encounter: Secondary | ICD-10-CM | POA: Diagnosis not present

## 2021-04-28 DIAGNOSIS — S299XXD Unspecified injury of thorax, subsequent encounter: Secondary | ICD-10-CM | POA: Insufficient documentation

## 2021-04-29 NOTE — Progress Notes (Signed)
VALEEN, BORYS ([id number]) Visit Report for 04/28/2021 Arrival Information Details Patient Name: Date of Service: Deanna Thompson 04/28/2021 10:30 A M Medical Record Number: [id number] Patient Account Number: [id number] Date of Birth/Sex: Treating RN: [birth year] (66 y.o. [name] Primary Care : Thersa Salt Other Clinician: Referring : Treating /Extender: Melanee Left Weeks in Treatment: 1 Visit Information History Since Last Visit Added or deleted any medications: No Patient Arrived: Ambulatory Any new allergies or adverse reactions: No Arrival Time: 10:44 Had a fall or experienced change in No Accompanied By: alone activities of daily living that may affect Transfer Assistance: None risk of falls: Patient Identification Verified: Yes Signs or symptoms of abuse/neglect since last visito No Secondary Verification Process Completed: Yes Hospitalized since last visit: No Patient Requires Transmission-Based Precautions: No Implantable device outside of the clinic excluding No Patient Has Alerts: Yes cellular tissue based products placed in the center Patient Alerts: 04/17/21 ABI R 1.10 since last visit: 04/17/21 TBI R 0.62 Has Dressing in Place as Prescribed: Yes Pain Present Now: No Electronic Signature(s) Signed: 04/29/2021 5:37:15 PM By: Levan Hurst RN, BSN Entered By: Levan Hurst on 04/28/2021 10:46:07 -------------------------------------------------------------------------------- Encounter Discharge Information Details Patient Name: Date of Service: Deanna Thompson 04/28/2021 10:30 A M Medical Record Number: [id number] Patient Account Number: [id number] Date of Birth/Sex: Treating RN: 20-Nov-1955 (66 y.o. [name] Primary Care : Thersa Salt Other Clinician: Referring : Treating /Extender: Melanee Left Weeks in Treatment: 1 Encounter Discharge Information Items  Post Procedure Vitals Discharge Condition: Stable Temperature (F): 97.8 Ambulatory Status: Ambulatory Pulse (bpm): 75 Discharge Destination: Home Respiratory Rate (breaths/min): 16 Transportation: Private Auto Blood Pressure (mmHg): 127/80 Accompanied By: alone Schedule Follow-up Appointment: Yes Clinical Summary of Care: Patient Declined Electronic Signature(s) Signed: 04/29/2021 5:37:15 PM By: Levan Hurst RN, BSN Entered By: Levan Hurst on 04/28/2021 11:23:33 -------------------------------------------------------------------------------- Multi Wound Chart Details Patient Name: Date of Service: Deanna Thompson 04/28/2021 10:30 A M Medical Record Number: [id number] Patient Account Number: [id number] Date of Birth/Sex: Treating RN: October 10, 1955 (66 y.o. Sue Lush Primary Care : Thersa Salt Other Clinician: Referring : Treating /Extender: Melanee Left Weeks in Treatment: 1 Vital Signs Height(in): 68 Pulse(bpm): 75 Weight(lbs): 260 Blood Pressure(mmHg): 127/80 Body Mass Index(BMI): 39.5 Temperature(F): 97.8 Respiratory Rate(breaths/min): 16 Photos: [N/A:N/A] Right, Lateral Lower Leg Right Back N/A Wound Location: Trauma Surgical Injury N/A Wounding Event: Trauma, Other Dehisced Wound N/A Primary Etiology: Anemia, Hypertension, Cirrhosis , Anemia, Hypertension, Cirrhosis , N/A Comorbid History: Seizure Disorder Seizure Disorder 04/04/2021 04/04/2021 N/A Date Acquired: 1 1 N/A Weeks of Treatment: Open Open N/A Wound Status: No No N/A Wound Recurrence: Yes No N/A Clustered Wound: 2 N/A N/A Clustered Quantity: 5x0.9x0.1 0x0x0 N/A Measurements L x W x D (cm) 3.534 0 N/A A (cm) : rea 0.353 0 N/A Volume (cm) : 53.90% 100.00% N/A % Reduction in A rea: 53.90% 100.00% N/A % Reduction in Volume: Full Thickness Without Exposed Full Thickness Without Exposed N/A Classification: Support Structures Support  Structures Medium None Present N/A Exudate A mount: Serosanguineous N/A N/A Exudate Type: red, brown N/A N/A Exudate Color: Distinct, outline attached Distinct, outline attached N/A Wound Margin: Medium (34-66%) None Present (0%) N/A Granulation A mount: Pink N/A N/A Granulation Quality: Medium (34-66%) None Present (0%) N/A Necrotic A mount: Fat Layer (Subcutaneous Tissue): Yes Fascia: No N/A Exposed Structures: Fascia: No Fat Layer (Subcutaneous Tissue): No Tendon: No Tendon: No Muscle: No Muscle: No Joint: No Joint: No Bone:  No Bone: No Small (1-33%) Large (67-100%) N/A Epithelialization: Debridement - Excisional N/A N/A Debridement: Pre-procedure Verification/Time Out 11:18 N/A N/A Taken: Necrotic/Eschar, Subcutaneous, N/A N/A Tissue Debrided: Slough Skin/Subcutaneous Tissue N/A N/A Level: 4.5 N/A N/A Debridement A (sq cm): rea Curette N/A N/A Instrument: Minimum N/A N/A Bleeding: Pressure N/A N/A Hemostasis Achieved: 0 N/A N/A Procedural Pain: 0 N/A N/A Post Procedural Pain: Debridement Treatment Response: Procedure was tolerated well N/A N/A Post Debridement Measurements L x 5x0.9x0.1 N/A N/A W x D (cm) 0.353 N/A N/A Post Debridement Volume: (cm) Debridement N/A N/A Procedures Performed: Treatment Notes Wound #1 (Lower Leg) Wound Laterality: Right, Lateral Cleanser Normal Saline Discharge Instruction: Cleanse the wound with Normal Saline prior to applying a clean dressing using gauze sponges, not tissue or cotton balls. Soap and Water Discharge Instruction: May shower and wash wound with dial antibacterial soap and water prior to dressing change. Peri-Wound Care Skin Prep Discharge Instruction: Use skin prep as directed Topical Primary Dressing Hydrofera Blue Classic Foam, 4x4 in Discharge Instruction: Moisten with saline prior to applying to wound bed Santyl Ointment Discharge Instruction: Apply nickel thick amount to wound bed as  instructed Secondary Dressing Bordered Gauze, 6x6 (in/in) Discharge Instruction: Apply over primary dressing as directed. Secured With Compression Wrap Compression Stockings Add-Ons Wound #2 (Back) Wound Laterality: Right Cleanser Peri-Wound Care Topical Primary Dressing Secondary Dressing Secured With Compression Wrap Compression Stockings Add-Ons Electronic Signature(s) Signed: 04/28/2021 1:55:35 PM By: Kalman Shan DO Signed: 04/28/2021 4:11:37 PM By: Lorrin Jackson Entered By: Kalman Shan on 04/28/2021 12:40:07 -------------------------------------------------------------------------------- Multi-Disciplinary Care Plan Details Patient Name: Date of Service: [name] [facility] 04/28/2021 10:30 A M Medical Record Number: [id number] Patient Account Number: [id number] Date of Birth/Sex: Treating RN: October 19, 1955 (66 y.o. [name] Primary Care : Thersa Salt Other Clinician: Referring : Treating /Extender: Melanee Left Weeks in Treatment: 1 Active Inactive Nutrition Nursing Diagnoses: Potential for alteratiion in Nutrition/Potential for imbalanced nutrition Goals: Patient/caregiver agrees to and verbalizes understanding of need to obtain nutritional consultation Date Initiated: 04/21/2021 Target Resolution Date: 05/02/2021 Goal Status: Active Interventions: Assess patient nutrition upon admission and as needed per policy Provide education on nutrition Treatment Activities: Patient referred to Primary Care Physician for further nutritional evaluation : 04/21/2021 Notes: Orientation to the Wound Care Program Nursing Diagnoses: Knowledge deficit related to the wound healing center program Goals: Patient/caregiver will verbalize understanding of the [facility] Date Initiated: 04/21/2021 Target Resolution Date: 05/02/2021 Goal Status: Active Interventions: Provide education on orientation to the wound  center Notes: Pain, Acute or Chronic Nursing Diagnoses: Pain, acute or chronic: actual or potential Potential alteration in comfort, pain Goals: Patient will verbalize adequate pain control and receive pain control interventions during procedures as needed Date Initiated: 04/21/2021 Target Resolution Date: 05/02/2021 Goal Status: Active Patient/caregiver will verbalize comfort level met Date Initiated: 04/21/2021 Target Resolution Date: 05/02/2021 Goal Status: Active Interventions: Encourage patient to take pain medications as prescribed Provide education on pain management Notes: Wound/Skin Impairment Nursing Diagnoses: Knowledge deficit related to ulceration/compromised skin integrity Goals: Patient/caregiver will verbalize understanding of skin care regimen Date Initiated: 04/21/2021 Target Resolution Date: 05/02/2021 Goal Status: Active Interventions: Assess patient/caregiver ability to perform ulcer/skin care regimen upon admission and as needed Assess ulceration(s) every visit Provide education on ulcer and skin care Treatment Activities: Skin care regimen initiated : 04/21/2021 Topical wound management initiated : 04/21/2021 Notes: Electronic Signature(s) Signed: 04/29/2021 5:37:15 PM By: Levan Hurst RN, BSN Entered By: Levan Hurst on 04/28/2021 10:56:30 --------------------------------------------------------------------------------  Pain Assessment Details Patient Name: Date of Service: Deanna Thompson 04/28/2021 10:30 A M Medical Record Number: [id number] Patient Account Number: [id number] Date of Birth/Sex: Treating RN: 10-Jun-1955 (66 y.o. [name] Primary Care : Thersa Salt Other Clinician: Referring : Treating /Extender: Melanee Left Weeks in Treatment: 1 Active Problems Location of Pain Severity and Description of Pain Patient Has Paino No Site Locations Pain Management and Medication Current Pain  Management: Electronic Signature(s) Signed: 04/29/2021 5:37:15 PM By: Levan Hurst RN, BSN Entered By: Levan Hurst on 04/28/2021 10:47:29 -------------------------------------------------------------------------------- Patient/Caregiver Education Details Patient Name: Date of Service: Deanna Thompson 1/30/2023andnbsp10:30 [place] Record Number: [id number] Patient Account Number: [id number] Date of Birth/Gender: Treating RN: 06-Oct-1955 (66 y.o. [name] Primary Care Physician: Thersa Salt Other Clinician: Referring Physician: Treating Physician/Extender: Melanee Left Weeks in Treatment: 1 Education Assessment Education Provided To: Patient Education Topics Provided Wound/Skin Impairment: Methods: Explain/Verbal Responses: State content correctly Electronic Signature(s) Signed: 04/29/2021 5:37:15 PM By: Levan Hurst RN, BSN Entered By: Levan Hurst on 04/28/2021 10:56:53 -------------------------------------------------------------------------------- Wound Assessment Details Patient Name: Date of Service: Deanna Thompson 04/28/2021 10:30 A M Medical Record Number: [id number] Patient Account Number: [id number] Date of Birth/Sex: Treating RN: December 10, 1955 (66 y.o. [name] Primary Care : Thersa Salt Other Clinician: Referring : Treating /Extender: Melanee Left Weeks in Treatment: 1 Wound Status Wound Number: 1 Primary Etiology: Trauma, Other Wound Location: Right, Lateral Lower Leg Wound Status: Open Wounding Event: Trauma Comorbid History: Anemia, Hypertension, Cirrhosis , Seizure Disorder Date Acquired: 04/04/2021 Weeks Of Treatment: 1 Clustered Wound: Yes Photos Wound Measurements Length: (cm) 5 Width: (cm) 0.9 Depth: (cm) 0.1 Clustered Quantity: 2 Area: (cm) 3.534 Volume: (cm) 0.353 % Reduction in Area: 53.9% % Reduction in Volume: 53.9% Epithelialization: Small  (1-33%) Tunneling: No Undermining: No Wound Description Classification: Full Thickness Without Exposed Support Structures Wound Margin: Distinct, outline attached Exudate Amount: Medium Exudate Type: Serosanguineous Exudate Color: red, brown Foul Odor After Cleansing: No Slough/Fibrino Yes Wound Bed Granulation Amount: Medium (34-66%) Exposed Structure Granulation Quality: Pink Fascia Exposed: No Necrotic Amount: Medium (34-66%) Fat Layer (Subcutaneous Tissue) Exposed: Yes Necrotic Quality: Adherent Slough Tendon Exposed: No Muscle Exposed: No Joint Exposed: No Bone Exposed: No Treatment Notes Wound #1 (Lower Leg) Wound Laterality: Right, Lateral Cleanser Normal Saline Discharge Instruction: Cleanse the wound with Normal Saline prior to applying a clean dressing using gauze sponges, not tissue or cotton balls. Soap and Water Discharge Instruction: May shower and wash wound with dial antibacterial soap and water prior to dressing change. Peri-Wound Care Skin Prep Discharge Instruction: Use skin prep as directed Topical Primary Dressing Hydrofera Blue Classic Foam, 4x4 in Discharge Instruction: Moisten with saline prior to applying to wound bed Santyl Ointment Discharge Instruction: Apply nickel thick amount to wound bed as instructed Secondary Dressing Bordered Gauze, 6x6 (in/in) Discharge Instruction: Apply over primary dressing as directed. Secured With Compression Wrap Compression Stockings Environmental education officer) Signed: 04/29/2021 5:37:15 PM By: Levan Hurst RN, BSN Entered By: Levan Hurst on 04/28/2021 10:53:51 -------------------------------------------------------------------------------- Wound Assessment Details Patient Name: Date of Service: Deanna Thompson 04/28/2021 10:30 A M Medical Record Number: [id number] Patient Account Number: [id number] Date of Birth/Sex: Treating RN: 10/12/1955 (66 y.o. [name] Primary Care :  Thersa Salt Other Clinician: Referring : Treating /Extender: Melanee Left Weeks in Treatment: 1 Wound Status Wound Number: 2 Primary Dehisced Wound Etiology: Wound Location: Right Back Wound Open Wounding Event:  Surgical Injury Status: Date Acquired: 04/04/2021 Notes: per patient dermatologist removed a skin tag from back. per patient Weeks Of Treatment: 1 three weeks ago now scabbed over. Patient requested Rosezetta Balderston to Clustered Wound: No look at the back. Comorbid Anemia, Hypertension, Cirrhosis , Seizure Disorder History: Photos Wound Measurements Length: (cm) Width: (cm) Depth: (cm) Area: (cm) Volume: (cm) 0 % Reduction in Area: 100% 0 % Reduction in Volume: 100% 0 Epithelialization: Large (67-100%) 0 Tunneling: No 0 Undermining: No Wound Description Classification: Full Thickness Without Exposed Support Structures Wound Margin: Distinct, outline attached Exudate Amount: None Present Foul Odor After Cleansing: No Slough/Fibrino No Wound Bed Granulation Amount: None Present (0%) Exposed Structure Necrotic Amount: None Present (0%) Fascia Exposed: No Fat Layer (Subcutaneous Tissue) Exposed: No Tendon Exposed: No Muscle Exposed: No Joint Exposed: No Bone Exposed: No Electronic Signature(s) Signed: 04/29/2021 5:37:15 PM By: Levan Hurst RN, BSN Entered By: Levan Hurst on 04/28/2021 10:54:37 -------------------------------------------------------------------------------- [place] Details Patient Name: Date of Service: Deanna Thompson 04/28/2021 10:30 A M Medical Record Number: [id number] Patient Account Number: [id number] Date of Birth/Sex: Treating RN: 1955-11-27 (66 y.o. [name] Primary Care Mikaylee Arseneau: Thersa Salt Other Clinician: Referring Barbarajean Kinzler: Treating Tahara Ruffini/Extender: Melanee Left Weeks in Treatment: 1 Vital Signs Time Taken: 10:44 Temperature (F): 97.8 Height (in): 68 Pulse  (bpm): 75 Weight (lbs): 260 Respiratory Rate (breaths/min): 16 Body Mass Index (BMI): 39.5 Blood Pressure (mmHg): 127/80 Reference Range: 80 - 120 mg / dl Electronic Signature(s) Signed: 04/29/2021 5:37:15 PM By: Levan Hurst RN, BSN Entered By: Levan Hurst on 04/28/2021 10:47:11
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
## 2021-05-05 ENCOUNTER — Other Ambulatory Visit: Payer: Self-pay

## 2021-05-05 ENCOUNTER — Encounter (HOSPITAL_BASED_OUTPATIENT_CLINIC_OR_DEPARTMENT_OTHER): Payer: Medicare HMO | Attending: Internal Medicine | Admitting: Internal Medicine

## 2021-05-05 DIAGNOSIS — K7460 Unspecified cirrhosis of liver: Secondary | ICD-10-CM | POA: Insufficient documentation

## 2021-05-05 DIAGNOSIS — F319 Bipolar disorder, unspecified: Secondary | ICD-10-CM | POA: Diagnosis not present

## 2021-05-05 DIAGNOSIS — I10 Essential (primary) hypertension: Secondary | ICD-10-CM | POA: Diagnosis not present

## 2021-05-05 DIAGNOSIS — S8012XA Contusion of left lower leg, initial encounter: Secondary | ICD-10-CM | POA: Diagnosis not present

## 2021-05-05 DIAGNOSIS — L4050 Arthropathic psoriasis, unspecified: Secondary | ICD-10-CM | POA: Insufficient documentation

## 2021-05-05 DIAGNOSIS — L98422 Non-pressure chronic ulcer of back with fat layer exposed: Secondary | ICD-10-CM | POA: Diagnosis not present

## 2021-05-05 DIAGNOSIS — L97812 Non-pressure chronic ulcer of other part of right lower leg with fat layer exposed: Secondary | ICD-10-CM | POA: Insufficient documentation
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
## 2021-05-05 NOTE — Progress Notes (Signed)
LAREEN, MULLINGS ([id number]) Visit Report for 05/05/2021 Arrival Information Details Patient Name: Date of Service: Deanna Thompson Leonides Schanz 05/05/2021 11:00 [place] Record Number: [id number] Patient Account Number: [id number] Date of Birth/Sex: Treating RN: 1956-02-07 (66 y.o. Helene Shoe, Meta.Reding Primary Care Trenell Concannon: Thersa Salt Other Clinician: Referring Dianna Deshler: Treating Thirza Pellicano/Extender: Melanee Left Weeks in Treatment: 2 Visit Information History Since Last Visit Added or deleted any medications: No Patient Arrived: Ambulatory Any new allergies or adverse reactions: No Arrival Time: 11:09 Had a fall or experienced change in No Accompanied By: self activities of daily living that may affect Transfer Assistance: None risk of falls: Patient Identification Verified: Yes Signs or symptoms of abuse/neglect since last No Secondary Verification Process Completed: Yes visito Patient Requires Transmission-Based Precautions: No Hospitalized since last visit: No Patient Has Alerts: Yes Implantable device outside of the clinic No Patient Alerts: 04/17/21 ABI R 1.10 excluding 04/17/21 TBI R 0.62 cellular tissue based products placed in the Thompson since last visit: Has Dressing in [place] as Prescribed: Yes Has Footwear/Offloading in Place as Yes Prescribed: Right: Removable Cast Walker/Walking Boot Pain Present Now: No Electronic Signature(s) Signed: 05/05/2021 4:47:34 PM By: Deon Pilling RN, BSN Entered By: Deon Pilling on 05/05/2021 11:15:53 -------------------------------------------------------------------------------- Encounter Discharge Information Details Patient Name: Date of Service: Deanna [facility] 05/05/2021 11:00 Deanna Thompson Record Number: [id number] Patient Account Number: [id number] Date of Birth/Sex: Treating RN: May 10, 1955 (66 y.o. Debby Bud Primary Care Shawnee Higham: Thersa Salt Other Clinician: Referring Aquila Delaughter: Treating Zada Haser/Extender:  Melanee Left Weeks in Treatment: 2 Encounter Discharge Information Items Post Procedure Vitals Discharge Condition: Stable Temperature (F): 97.6 Ambulatory Status: Ambulatory Pulse (bpm): 80 Discharge Destination: Home Respiratory Rate (breaths/min): 20 Transportation: Private Auto Blood Pressure (mmHg): 120/78 Accompanied By: self Schedule Follow-up Appointment: Yes Clinical Summary of Care: Electronic Signature(s) Signed: 05/05/2021 4:47:34 PM By: Deon Pilling RN, BSN Entered By: Deon Pilling on 05/05/2021 11:47:12 -------------------------------------------------------------------------------- Lower Extremity Assessment Details Patient Name: Date of Service: Deanna [place] 05/05/2021 11:00 [place] Record Number: [id number] Patient Account Number: [id number] Date of Birth/Sex: Treating RN: 05/16/1955 (66 y.o. Debby Bud Primary Care Trisa Cranor: Thersa Salt Other Clinician: Referring Kiele Heavrin: Treating Shaaron Golliday/Extender: Melanee Left Weeks in Treatment: 2 Electronic Signature(s) Signed: 05/05/2021 4:47:34 PM By: Deon Pilling RN, BSN Entered By: Deon Pilling on 05/05/2021 11:16:52 -------------------------------------------------------------------------------- Multi Wound Chart Details Patient Name: Date of Service: Deanna [place] 05/05/2021 11:00 [place] Record Number: [id number] Patient Account Number: [id number] Date of Birth/Sex: Treating RN: 1955/09/26 (66 y.o. Sue Lush Primary Care Gabrielle Mester: Thersa Salt Other Clinician: Referring Redith Drach: Treating Javon Snee/Extender: Melanee Left Weeks in Treatment: 2 Vital Signs Height(in): 68 Pulse(bpm): 80 Weight(lbs): 260 Blood Pressure(mmHg): 120/78 Body Mass Index(BMI): 39.5 Temperature(F): 97.6 Respiratory Rate(breaths/min): 20 Photos: [N/A:N/A] Right, Lateral Lower Leg N/A N/A Wound Location: Trauma N/A N/A Wounding Event: Trauma,  Other N/A N/A Primary Etiology: Anemia, Hypertension, Cirrhosis , N/A N/A Comorbid History: Seizure Disorder 04/04/2021 N/A N/A Date Acquired: 2 N/A N/A Weeks of Treatment: Open N/A N/A Wound Status: No N/A N/A Wound Recurrence: Yes N/A N/A Clustered Wound: 1 N/A N/A Clustered Quantity: 3.5x0.4x0.1 N/A N/A Measurements L x W x D (cm) 1.1 N/A N/A A (cm) : rea 0.11 N/A N/A Volume (cm) : 85.60% N/A N/A % Reduction in Area: 85.60% N/A N/A % Reduction in Volume: Full Thickness Without Exposed N/A N/A Classification: Support Structures Medium N/A N/A Exudate Amount: Serosanguineous N/A N/A Exudate Type:  red, brown N/A N/A Exudate Color: Distinct, outline attached N/A N/A Wound Margin: Large (67-100%) N/A N/A Granulation Amount: Pink N/A N/A Granulation Quality: Small (1-33%) N/A N/A Necrotic Amount: Fat Layer (Subcutaneous Tissue): Yes N/A N/A Exposed Structures: Fascia: No Tendon: No Muscle: No Joint: No Bone: No Large (67-100%) N/A N/A Epithelialization: Debridement - Excisional N/A N/A Debridement: Pre-procedure Verification/Time Out 11:30 N/A N/A Taken: Lidocaine 4% Topical Solution N/A N/A Pain Control: Subcutaneous, Slough N/A N/A Tissue Debrided: Skin/Subcutaneous Tissue N/A N/A Level: 1.4 N/A N/A Debridement A (sq cm): rea Curette N/A N/A Instrument: Minimum N/A N/A Bleeding: Pressure N/A N/A Hemostasis A chieved: 0 N/A N/A Procedural Pain: 0 N/A N/A Post Procedural Pain: Procedure was tolerated well N/A N/A Debridement Treatment Response: 3.5x0.4x0.1 N/A N/A Post Debridement Measurements L x W x D (cm) 0.11 N/A N/A Post Debridement Volume: (cm) Debridement N/A N/A Procedures Performed: Treatment Notes Wound #1 (Lower Leg) Wound Laterality: Right, Lateral Cleanser Normal Saline Discharge Instruction: Cleanse the wound with Normal Saline prior to applying a clean dressing using gauze sponges, not tissue or cotton balls. Soap  and Water Discharge Instruction: May shower and wash wound with dial antibacterial soap and water prior to dressing change. Peri-Wound Care Skin Prep Discharge Instruction: Use skin prep as directed Topical Primary Dressing Hydrofera Blue Classic Foam, 4x4 in Discharge Instruction: Moisten with saline prior to applying to wound bed Secondary Dressing Bordered Gauze, 6x6 (in/in) Discharge Instruction: Apply over primary dressing as directed. Secured With Compression Wrap Compression Stockings Environmental education officer) Signed: 05/05/2021 12:43:10 PM By: Kalman Shan DO Signed: 05/05/2021 4:43:35 PM By: Lorrin Jackson Entered By: Kalman Shan on 05/05/2021 12:40:56 -------------------------------------------------------------------------------- Multi-Disciplinary Care Plan Details Patient Name: Date of Service: Deanna [facility] [employer] [employer] [facility] 05/05/2021 11:00 [place] Record Number: [id number] Patient Account Number: [id number] Date of Birth/Sex: Treating RN: 1955-10-20 (66 y.o. Helene Shoe, Tammi Klippel Primary Care Eman Rynders: Thersa Salt Other Clinician: Referring Kobyn Kray: Treating Shadee Montoya/Extender: Melanee Left Weeks in Treatment: 2 Active Inactive Nutrition Nursing Diagnoses: Potential for alteratiion in Nutrition/Potential for imbalanced nutrition Goals: Patient/caregiver agrees to and verbalizes understanding of need to obtain nutritional consultation Date Initiated: 04/21/2021 Target Resolution Date: 05/02/2021 Goal Status: Active Interventions: Assess patient nutrition upon admission and as needed per policy Provide education on nutrition Treatment Activities: Patient referred to Primary Care Physician for further nutritional evaluation : 04/21/2021 Notes: Pain, Acute or Chronic Nursing Diagnoses: Pain, acute or chronic: actual or potential Potential alteration in comfort, pain Goals: Patient will verbalize adequate pain control and receive pain control  interventions during procedures as needed Date Initiated: 04/21/2021 Target Resolution Date: 05/02/2021 Goal Status: Active Patient/caregiver will verbalize comfort level met Date Initiated: 04/21/2021 Target Resolution Date: 05/02/2021 Goal Status: Active Interventions: Encourage patient to take pain medications as prescribed Provide education on pain management Notes: Wound/Skin Impairment Nursing Diagnoses: Knowledge deficit related to ulceration/compromised skin integrity Goals: Patient/caregiver will verbalize understanding of skin care regimen Date Initiated: 04/21/2021 Target Resolution Date: 05/02/2021 Goal Status: Active Interventions: Assess patient/caregiver ability to perform ulcer/skin care regimen upon admission and as needed Assess ulceration(s) every visit Provide education on ulcer and skin care Treatment Activities: Skin care regimen initiated : 04/21/2021 Topical wound management initiated : 04/21/2021 Notes: Electronic Signature(s) Signed: 05/05/2021 4:47:34 PM By: Deon Pilling RN, BSN Entered By: Deon Pilling on 05/05/2021 11:18:34 -------------------------------------------------------------------------------- Pain Assessment Details Patient Name: Date of Service: Deanna [place] 05/05/2021 11:00 [place] Record Number: [id number] Patient Account Number: [id number] Date of Birth/Sex: Treating RN:  1955-06-09 (66 y.o. Debby Bud Primary Care [place]: Thersa Salt Other Clinician: Referring Harol Shabazz: Treating Noemie Devivo/Extender: Melanee Left Weeks in Treatment: 2 Active Problems Location of Pain Severity and Description of Pain Patient Has Paino No Site Locations Rate the pain. Current Pain Level: 0 Pain Management and Medication Current Pain Management: Medication: No Cold Application: No Rest: No Massage: No Activity: No T.E.N.S.: No Heat Application: No Leg drop or elevation: No Is the Current Pain Management Adequate:  Adequate How does your wound impact your activities of daily livingo Sleep: No Bathing: No Appetite: No Relationship With Others: No Bladder Continence: No Emotions: No Bowel Continence: No Work: No Toileting: No Drive: No Dressing: No Hobbies: No Engineer, maintenance) Signed: 05/05/2021 4:47:34 PM By: Deon Pilling RN, BSN Entered By: Deon Pilling on 05/05/2021 11:16:45 -------------------------------------------------------------------------------- Patient/Caregiver Education Details Patient Name: Date of Service: Deanna [place] 2/6/2023andnbsp11:00 [place] Record Number: [id number] Patient Account Number: [id number] Date of Birth/Gender: Treating RN: 1956-02-09 (66 y.o. Debby Bud Primary Care Physician: Thersa Salt Other Clinician: Referring Physician: Treating Physician/Extender: Melanee Left Weeks in Treatment: 2 Education Assessment Education Provided To: Patient Education Topics Provided Wound/Skin Impairment: Handouts: Skin Care Do's and Dont's Methods: Explain/Verbal Responses: Reinforcements needed Electronic Signature(s) Signed: 05/05/2021 4:47:34 PM By: Deon Pilling RN, BSN Entered By: Deon Pilling on 05/05/2021 11:18:58 -------------------------------------------------------------------------------- Wound Assessment Details Patient Name: Date of Service: Deanna [place] 05/05/2021 11:00 [place] Record Number: [id number] Patient Account Number: [id number] Date of Birth/Sex: Treating RN: November 16, 1955 (66 y.o. Helene Shoe, Tammi Klippel Primary Care Chasidy Janak: Thersa Salt Other Clinician: Referring Zetta Stoneman: Treating Dionisios Ricci/Extender: Melanee Left Weeks in Treatment: 2 Wound Status Wound Number: 1 Primary Etiology: Trauma, Other Wound Location: Right, Lateral Lower Leg Wound Status: Open Wounding Event: Trauma Comorbid History: Anemia, Hypertension, Cirrhosis , Seizure Disorder Date Acquired:  04/04/2021 Weeks Of Treatment: 2 Clustered Wound: Yes Photos Wound Measurements Length: (cm) Width: (cm) Depth: (cm) Clustered Quantity: Area: (cm) Volume: (cm) 3.5 % Reduction in Area: 85.6% 0.4 % Reduction in Volume: 85.6% 0.1 Epithelialization: Large (67-100%) 1 Tunneling: No 1.1 Undermining: No 0.11 Wound Description Classification: Full Thickness Without Exposed Support Structures Wound Margin: Distinct, outline attached Exudate Amount: Medium Exudate Type: Serosanguineous Exudate Color: red, brown Foul Odor After Cleansing: No Slough/Fibrino Yes Wound Bed Granulation Amount: Large (67-100%) Exposed Structure Granulation Quality: Pink Fascia Exposed: No Necrotic Amount: Small (1-33%) Fat Layer (Subcutaneous Tissue) Exposed: Yes Necrotic Quality: Adherent Slough Tendon Exposed: No Muscle Exposed: No Joint Exposed: No Bone Exposed: No Treatment Notes Wound #1 (Lower Leg) Wound Laterality: Right, Lateral Cleanser Normal Saline Discharge Instruction: Cleanse the wound with Normal Saline prior to applying a clean dressing using gauze sponges, not tissue or cotton balls. Soap and Water Discharge Instruction: May shower and wash wound with dial antibacterial soap and water prior to dressing change. Peri-Wound Care Skin Prep Discharge Instruction: Use skin prep as directed Topical Primary Dressing Hydrofera Blue Classic Foam, 4x4 in Discharge Instruction: Moisten with saline prior to applying to wound bed Secondary Dressing Bordered Gauze, 6x6 (in/in) Discharge Instruction: Apply over primary dressing as directed. Secured With Compression Wrap Compression Stockings Environmental education officer) Signed: 05/05/2021 4:47:34 PM By: Deon Pilling RN, BSN Entered By: Deon Pilling on 05/05/2021 11:18:51 -------------------------------------------------------------------------------- Vitals Details Patient Name: Date of Service: De[facility] 05/05/2021 11:00  [place] Record Number: [id number] Patient Account Number: [id number] Date of Birth/Sex: Treating RN: 1955-10-09 (66 y.o. Helene Shoe, Tammi Klippel Primary  Care Makenzie Weisner: Thersa Salt Other Clinician: Referring Alleya Demeter: Treating Rocko Fesperman/Extender: Melanee Left Weeks in Treatment: 2 Vital Signs Time Taken: 11:09 Temperature (F): 97.6 Height (in): 68 Pulse (bpm): 80 Weight (lbs): 260 Respiratory Rate (breaths/min): 20 Body Mass Index (BMI): 39.5 Blood Pressure (mmHg): 120/78 Reference Range: 80 - 120 mg / dl Electronic Signature(s) Signed: 05/05/2021 4:47:34 PM By: Deon Pilling RN, BSN Entered By: Deon Pilling on 05/05/2021 11:46:36

## 2021-05-08 ENCOUNTER — Encounter: Payer: Self-pay | Admitting: Orthopaedic Surgery

## 2021-05-08 ENCOUNTER — Ambulatory Visit: Payer: Medicare HMO | Admitting: Orthopaedic Surgery

## 2021-05-08 ENCOUNTER — Other Ambulatory Visit: Payer: Self-pay

## 2021-05-08 VITALS — BP 120/79 | HR 84 | Ht 68.0 in | Wt 255.0 lb

## 2021-05-08 DIAGNOSIS — S8012XD Contusion of left lower leg, subsequent encounter: Secondary | ICD-10-CM

## 2021-05-08 DIAGNOSIS — S96911D Strain of unspecified muscle and tendon at ankle and foot level, right foot, subsequent encounter: Secondary | ICD-10-CM

## 2021-05-08 NOTE — Progress Notes (Signed)
I am better.  She has much less swelling of the right ankle.  She has been coming out of the CAM walker.  Her left leg hematoma is smaller and not painful, no redness or discharge.  Left upper medial lower leg hematoma is smaller, more pliable, no drainage, no redness, no pain.  Right ankle has much less swelling and full ROM.  NV intact.  Encounter Diagnoses  Name Primary?   Hematoma of leg, left, subsequent encounter Yes   Ankle strain, right, subsequent encounter    I have recommended an anklet for the ankle.  Continue to observe the hematoma.  I will see her as needed.  Call if any problem.  Precautions discussed.  Electronically Signed Sanjuana Kava, MD 2/9/202310:48 AM

## 2021-05-12 ENCOUNTER — Other Ambulatory Visit: Payer: Self-pay

## 2021-05-12 ENCOUNTER — Encounter (HOSPITAL_BASED_OUTPATIENT_CLINIC_OR_DEPARTMENT_OTHER): Payer: Medicare HMO | Admitting: Internal Medicine

## 2021-05-12 DIAGNOSIS — T798XXA Other early complications of trauma, initial encounter: Secondary | ICD-10-CM

## 2021-05-12 DIAGNOSIS — K7460 Unspecified cirrhosis of liver: Secondary | ICD-10-CM | POA: Diagnosis not present

## 2021-05-12 DIAGNOSIS — L98422 Non-pressure chronic ulcer of back with fat layer exposed: Secondary | ICD-10-CM | POA: Diagnosis not present

## 2021-05-12 DIAGNOSIS — L97812 Non-pressure chronic ulcer of other part of right lower leg with fat layer exposed: Secondary | ICD-10-CM
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
## 2021-05-12 NOTE — Progress Notes (Signed)
Deanna Thompson, Deanna Thompson ([id number]) Visit Report for 05/12/2021 Arrival Information Details Patient Name: Date of Service: Register Deanna Thompson 05/12/2021 11:00 [place] Record Number: [id number] Patient Account Number: [id number] Date of Birth/Sex: Treating RN: 1955/07/01 (66 y.o. [name] Primary Care Teela Narducci: Thersa Salt Other Clinician: Referring Haydee Jabbour: Treating Rosbel Buckner/Extender: Melanee Left Weeks in Treatment: 3 Visit Information History Since Last Visit Added or deleted any medications: No Patient Arrived: Ambulatory Any new allergies or adverse reactions: No Arrival Time: 10:59 Had a fall or experienced change in No Accompanied By: self activities of daily living that may affect Transfer Assistance: None risk of falls: Patient Identification Verified: Yes Signs or symptoms of abuse/neglect since last visito No Patient Requires Transmission-Based Precautions: No Hospitalized since last visit: No Patient Has Alerts: Yes Implantable device outside of the clinic excluding No Patient Alerts: 04/17/21 ABI R 1.10 cellular tissue based products placed in the center 04/17/21 TBI R 0.62 since last visit: Has Dressing in Place as Prescribed: Yes Pain Present Now: No Electronic Signature(s) Signed: 05/12/2021 1:09:11 PM By: Dellie Catholic RN Entered By: Dellie Catholic on 05/12/2021 11:01:23 -------------------------------------------------------------------------------- Clinic Level of Care Assessment Details Patient Name: Date of Service: Deanna Thompson 05/12/2021 11:00 [place] Record Number: [id number] Patient Account Number: [id number] Date of Birth/Sex: Treating RN: [birth year] (66 y.o. [name] Primary Care Marialuisa Basara: Thersa Salt Other Clinician: Referring Benigna Delisi: Treating Dequincy Born/Extender: Melanee Left Weeks in Treatment: 3 Clinic Level of Care Assessment Items TOOL 4 Quantity Score X- 1 0 Use when only an  EandM is performed on FOLLOW-UP visit ASSESSMENTS - Nursing Assessment / Reassessment X- 1 10 Reassessment of Co-morbidities (includes updates in patient status) X- 1 5 Reassessment of Adherence to Treatment Plan ASSESSMENTS - Wound and Skin A ssessment / Reassessment X - Simple Wound Assessment / Reassessment - one wound 1 5 []  - 0 Complex Wound Assessment / Reassessment - multiple wounds []  - 0 Dermatologic / Skin Assessment (not related to wound area) ASSESSMENTS - Focused Assessment []  - 0 Circumferential Edema Measurements - multi extremities []  - 0 Nutritional Assessment / Counseling / Intervention X- 1 5 Lower Extremity Assessment (monofilament, tuning fork, pulses) []  - 0 Peripheral Arterial Disease Assessment (using hand held doppler) ASSESSMENTS - Ostomy and/or Continence Assessment and Care []  - 0 Incontinence Assessment and Management []  - 0 Ostomy Care Assessment and Management (repouching, etc.) PROCESS - Coordination of Care X - Simple Patient / Family Education for ongoing care 1 15 []  - 0 Complex (extensive) Patient / Family Education for ongoing care []  - 0 Staff obtains Programmer, systems, Records, T Results / Process Orders est X- 1 10 Staff telephones HHA, Nursing Homes / Clarify orders / etc []  - 0 Routine Transfer to another Facility (non-emergent condition) []  - 0 Routine Hospital Admission (non-emergent condition) []  - 0 New Admissions / Biomedical engineer / Ordering NPWT Apligraf, etc. , []  - 0 Emergency Hospital Admission (emergent condition) []  - 0 Simple Discharge Coordination []  - 0 Complex (extensive) Discharge Coordination PROCESS - Special Needs []  - 0 Pediatric / Minor Patient Management []  - 0 Isolation Patient Management []  - 0 Hearing / Language / Visual special needs []  - 0 Assessment of Community assistance (transportation, D/C planning, etc.) []  - 0 Additional assistance / Altered mentation []  - 0 Support Surface(s)  Assessment (bed, cushion, seat, etc.) INTERVENTIONS - Wound Cleansing / Measurement X - Simple Wound Cleansing - one wound 1 5 []  - 0 Complex  Wound Cleansing - multiple wounds X- 1 5 Wound Imaging (photographs - any number of wounds) $RemoveBe'[]'YONfibtlr$  - 0 Wound Tracing (instead of photographs) X- 1 5 Simple Wound Measurement - one wound $RemoveB'[]'iFyLKCfq$  - 0 Complex Wound Measurement - multiple wounds INTERVENTIONS - Wound Dressings X - Small Wound Dressing one or multiple wounds 1 10 $Re'[]'WtI$  - 0 Medium Wound Dressing one or multiple wounds $RemoveBeforeD'[]'uHJnAvYyHMtLhn$  - 0 Large Wound Dressing one or multiple wounds $RemoveBeforeD'[]'nglARBrBPrwDtr$  - 0 Application of Medications - topical $RemoveB'[]'jKfqEIgc$  - 0 Application of Medications - injection INTERVENTIONS - Miscellaneous $RemoveBeforeD'[]'tjweUvFRWBCyfW$  - 0 External ear exam $Remove'[]'gLyPNIZ$  - 0 Specimen Collection (cultures, biopsies, blood, body fluids, etc.) $RemoveBefor'[]'TdaaWqLnenDf$  - 0 Specimen(s) / Culture(s) sent or taken to Lab for analysis $RemoveBefo'[]'rbdSPXMUCyT$  - 0 Patient Transfer (multiple staff / Civil Service fast streamer / Similar devices) $RemoveBeforeDE'[]'ceLzueouowGXXAh$  - 0 Simple Staple / Suture removal (25 or less) $Remove'[]'itWvEFI$  - 0 Complex Staple / Suture removal (26 or more) $Remove'[]'SZHHsSs$  - 0 Hypo / Hyperglycemic Management (close monitor of Blood Glucose) $RemoveBefore'[]'GkDdhfAtyoMYQ$  - 0 Ankle / Brachial Index (ABI) - do not check if billed separately X- 1 5 Vital Signs Has the patient been seen at the hospital within the last three years: Yes Total Score: 80 Level Of Care: New/Established - Level 3 Electronic Signature(s) Signed: 05/12/2021 1:09:11 PM By: Dellie Catholic RN Entered By: Dellie Catholic on 05/12/2021 13:06:37 -------------------------------------------------------------------------------- Encounter Discharge Information Details Patient Name: Date of Service: Deanna Thompson [facility] 05/12/2021 11:00 [place] Record Number: [id number] Patient Account Number: [id number] Date of Birth/Sex: Treating RN: 1955/05/11 (66 y.o. [name] Primary Care Christon Parada: Thersa Salt Other Clinician: Referring Norwood Quezada: Treating Hawraa Stambaugh/Extender: Melanee Left Weeks in Treatment: 3 Encounter Discharge Information Items Discharge Condition: Stable Ambulatory Status: Ambulatory Discharge Destination: Home Transportation: Private Auto Accompanied By: self Schedule Follow-up Appointment: Yes Clinical Summary of Care: Patient Declined Electronic Signature(s) Signed: 05/12/2021 1:09:11 PM By: Dellie Catholic RN Entered By: Dellie Catholic on 05/12/2021 13:08:38 -------------------------------------------------------------------------------- Lower Extremity Assessment Details Patient Name: Date of Service: Deanna Thompson 05/12/2021 11:00 [place] Record Number: [id number] Patient Account Number: [id number] Date of Birth/Sex: Treating RN: 07-16-1955 (66 y.o. [name] Primary Care Rosario Duey: Thersa Salt Other Clinician: Referring Nakeita Styles: Treating Jamarii Banks/Extender: Melanee Left Weeks in Treatment: 3 Edema Assessment Assessed: [Left: Yes] [Right: No] Edema: [Left: N] [Right: o] Calf Left: Right: Point of Measurement: 36 cm From Medial Instep 35.5 cm Ankle Left: Right: Point of Measurement: 11 cm From Medial Instep 20 cm Knee To Floor Left: Right: From Medial Instep 47 cm Electronic Signature(s) Signed: 05/12/2021 1:09:11 PM By: Dellie Catholic RN Entered By: Dellie Catholic on 05/12/2021 11:14:01 -------------------------------------------------------------------------------- Multi Wound Chart Details Patient Name: Date of Service: Deanna Thompson [facility] 05/12/2021 11:00 A M Medical Record Number: [id number] Patient Account Number: [id number] Date of Birth/Sex: Treating RN: 1955-11-14 (66 y.o. Sue Lush Primary Care Bushra Denman: Thersa Salt Other Clinician: Referring Abbegayle Denault: Treating Rhone Ozaki/Extender: Melanee Left Weeks in Treatment: 3 Vital Signs Height(in): 68 Pulse(bpm): 65 Weight(lbs): 260 Blood Pressure(mmHg): 113/78 Body Mass Index(BMI):  39.5 Temperature(F): 98.3 Respiratory Rate(breaths/min): 18 Photos: [N/A:N/A] Right, Lateral Lower Leg N/A N/A Wound Location: Trauma N/A N/A Wounding Event: Trauma, Other N/A N/A Primary Etiology: Anemia, Hypertension, Cirrhosis , N/A N/A Comorbid History: Seizure Disorder 04/04/2021 N/A N/A Date Acquired: 3 N/A N/A Weeks of Treatment: Open N/A N/A Wound Status: No N/A N/A Wound Recurrence: Yes N/A N/A Clustered Wound: 1 N/A N/A Clustered Quantity: 2.5x0.2x0.1 N/A N/A  Measurements L x W x D (cm) 0.393 N/A N/A A (cm) : rea 0.039 N/A N/A Volume (cm) : 94.90% N/A N/A % Reduction in Area: 94.90% N/A N/A % Reduction in Volume: Full Thickness Without Exposed N/A N/A Classification: Support Structures Medium N/A N/A Exudate Amount: Serosanguineous N/A N/A Exudate Type: red, brown N/A N/A Exudate Color: Distinct, outline attached N/A N/A Wound Margin: Large (67-100%) N/A N/A Granulation Amount: Pink N/A N/A Granulation Quality: Small (1-33%) N/A N/A Necrotic Amount: Fat Layer (Subcutaneous Tissue): Yes N/A N/A Exposed Structures: Fascia: No Tendon: No Muscle: No Joint: No Bone: No Large (67-100%) N/A N/A Epithelialization: Treatment Notes Electronic Signature(s) Signed: 05/12/2021 12:58:12 PM By: Kalman Shan DO Signed: 05/12/2021 5:31:57 PM By: Lorrin Jackson Entered By: Kalman Shan on 05/12/2021 12:54:49 -------------------------------------------------------------------------------- Multi-Disciplinary Care Plan Details Patient Name: Date of Service: Deanna Thompson [facility] 05/12/2021 11:00 A M Medical Record Number: [id number] Patient Account Number: [id number] Date of Birth/Sex: Treating RN: 09-25-1955 (66 y.o. [name] Primary Care Janisse Ghan: Thersa Salt Other Clinician: Referring Kasem Mozer: Treating Yandell Mcjunkins/Extender: Melanee Left Weeks in Treatment: 3 Active Inactive Nutrition Nursing Diagnoses: Potential  for alteratiion in Nutrition/Potential for imbalanced nutrition Goals: Patient/caregiver agrees to and verbalizes understanding of need to obtain nutritional consultation Date Initiated: 04/21/2021 Target Resolution Date: 06/03/2021 Goal Status: Active Interventions: Assess patient nutrition upon admission and as needed per policy Provide education on nutrition Treatment Activities: Patient referred to Primary Care Physician for further nutritional evaluation : 04/21/2021 Notes: Pain, Acute or Chronic Nursing Diagnoses: Pain, acute or chronic: actual or potential Potential alteration in comfort, pain Goals: Patient will verbalize adequate pain control and receive pain control interventions during procedures as needed Date Initiated: 04/21/2021 Target Resolution Date: 06/03/2021 Goal Status: Active Patient/caregiver will verbalize comfort level met Date Initiated: 04/21/2021 Target Resolution Date: 06/03/2021 Goal Status: Active Interventions: Encourage patient to take pain medications as prescribed Provide education on pain management Notes: Wound/Skin Impairment Nursing Diagnoses: Knowledge deficit related to ulceration/compromised skin integrity Goals: Patient/caregiver will verbalize understanding of skin care regimen Date Initiated: 04/21/2021 Target Resolution Date: 06/03/2021 Goal Status: Active Interventions: Assess patient/caregiver ability to perform ulcer/skin care regimen upon admission and as needed Assess ulceration(s) every visit Provide education on ulcer and skin care Treatment Activities: Skin care regimen initiated : 04/21/2021 Topical wound management initiated : 04/21/2021 Notes: Electronic Signature(s) Signed: 05/12/2021 1:09:11 PM By: Dellie Catholic RN Entered By: Dellie Catholic on 05/12/2021 11:33:22 -------------------------------------------------------------------------------- Pain Assessment Details Patient Name: Date of Service: Deanna Thompson  05/12/2021 11:00 [place] Record Number: [id number] Patient Account Number: [id number] Date of Birth/Sex: Treating RN: 03/25/1956 (66 y.o. [name] Primary Care Javonnie Illescas: Thersa Salt Other Clinician: Referring Retal Tonkinson: Treating Johann Gascoigne/Extender: Melanee Left Weeks in Treatment: 3 Active Problems Location of Pain Severity and Description of Pain Patient Has Paino No Site Locations Pain Management and Medication Current Pain Management: Electronic Signature(s) Signed: 05/12/2021 1:09:11 PM By: Dellie Catholic RN Entered By: Dellie Catholic on 05/12/2021 11:02:56 -------------------------------------------------------------------------------- Patient/Caregiver Education Details Patient Name: Date of Service: Deanna Thompson 2/13/2023andnbsp11:00 A M Medical Record Number: [id number] Patient Account Number: [id number] Date of Birth/Gender: Treating RN: 07/23/1955 (66 y.o. [name] Primary Care Physician: Thersa Salt Other Clinician: Referring Physician: Treating Physician/Extender: Melanee Left Weeks in Treatment: 3 Education Assessment Education Provided To: Patient Education Topics Provided Pain: Methods: Explain/Verbal Responses: Return demonstration correctly Electronic Signature(s) Signed: 05/12/2021 1:09:11 PM By: Dellie Catholic RN Entered By: Dellie Catholic on 05/12/2021  11:35:47 -------------------------------------------------------------------------------- Wound Assessment Details Patient Name: Date of Service: Deanna Thompson 05/12/2021 11:00 A M Medical Record Number: [id number] Patient Account Number: [id number] Date of Birth/Sex: Treating RN: 04-20-1955 (66 y.o. [name] Primary Care Terea Neubauer: Thersa Salt Other Clinician: Referring Pricsilla Lindvall: Treating Tishawna Larouche/Extender: Melanee Left Weeks in Treatment: 3 Wound Status Wound Number: 1 Primary Etiology: Trauma,  Other Wound Location: Right, Lateral Lower Leg Wound Status: Open Wounding Event: Trauma Comorbid History: Anemia, Hypertension, Cirrhosis , Seizure Disorder Date Acquired: 04/04/2021 Weeks Of Treatment: 3 Clustered Wound: Yes Photos Wound Measurements Length: (cm) 2.5 Width: (cm) 0.2 Depth: (cm) 0.1 Clustered Quantity: 1 Area: (cm) 0. Volume: (cm) 0. % Reduction in Area: 94.9% % Reduction in Volume: 94.9% Epithelialization: Large (67-100%) Tunneling: No 393 Undermining: No 039 Wound Description Classification: Full Thickness Without Exposed Support Structures Wound Margin: Distinct, outline attached Exudate Amount: Medium Exudate Type: Serosanguineous Exudate Color: red, brown Wound Bed Granulation Amount: Large (67-100%) Granulation Quality: Pink Necrotic Amount: Small (1-33%) Necrotic Quality: Adherent Slough Foul Odor After Cleansing: No Slough/Fibrino Yes Exposed Structure Fascia Exposed: No Fat Layer (Subcutaneous Tissue) Exposed: Yes Tendon Exposed: No Muscle Exposed: No Joint Exposed: No Bone Exposed: No Treatment Notes Wound #1 (Lower Leg) Wound Laterality: Right, Lateral Cleanser Normal Saline Discharge Instruction: Cleanse the wound with Normal Saline prior to applying a clean dressing using gauze sponges, not tissue or cotton balls. Soap and Water Discharge Instruction: May shower and wash wound with dial antibacterial soap and water prior to dressing change. Peri-Wound Care Skin Prep Discharge Instruction: Use skin prep as directed Topical Vaseline White Petroleum Jelly, 1 (oz) Primary Dressing Secondary Dressing Bordered Gauze, 6x6 (in/in) Discharge Instruction: Apply over primary dressing as directed. Secured With Compression Wrap Compression Stockings Environmental education officer) Signed: 05/12/2021 1:09:11 PM By: Dellie Catholic RN Entered By: Dellie Catholic on 05/12/2021  11:23:29 -------------------------------------------------------------------------------- Vitals Details Patient Name: Date of Service: Deanna Thompson [facility] 05/12/2021 11:00 A M Medical Record Number: [id number] Patient Account Number: [id number] Date of Birth/Sex: Treating RN: [birth year] (66 y.o. [name] Primary Care Jlyn Bracamonte: Thersa Salt Other Clinician: Referring Annora Guderian: Treating Zakaria Fromer/Extender: Melanee Left Weeks in Treatment: 3 Vital Signs Time Taken: 11:01 Temperature (F): 98.3 Height (in): 68 Pulse (bpm): 89 Weight (lbs): 260 Respiratory Rate (breaths/min): 18 Body Mass Index (BMI): 39.5 Blood Pressure (mmHg): 113/78 Reference Range: 80 - 120 mg / dl Electronic Signature(s) Signed: 05/12/2021 1:09:11 PM By: Dellie Catholic RN Entered By: Dellie Catholic on 05/12/2021 11:02:39

## 2021-05-13 ENCOUNTER — Other Ambulatory Visit: Payer: Self-pay | Admitting: Family Medicine

## 2021-05-13 DIAGNOSIS — I10 Essential (primary) hypertension: Secondary | ICD-10-CM

## 2021-05-19 ENCOUNTER — Encounter (HOSPITAL_BASED_OUTPATIENT_CLINIC_OR_DEPARTMENT_OTHER): Payer: Medicare HMO | Admitting: Internal Medicine

## 2021-05-19 ENCOUNTER — Other Ambulatory Visit: Payer: Self-pay

## 2021-05-19 DIAGNOSIS — K7460 Unspecified cirrhosis of liver: Secondary | ICD-10-CM | POA: Diagnosis not present

## 2021-05-19 DIAGNOSIS — T798XXA Other early complications of trauma, initial encounter: Secondary | ICD-10-CM | POA: Diagnosis not present

## 2021-05-19 DIAGNOSIS — L98422 Non-pressure chronic ulcer of back with fat layer exposed: Secondary | ICD-10-CM | POA: Diagnosis not present

## 2021-05-19 DIAGNOSIS — L97812 Non-pressure chronic ulcer of other part of right lower leg with fat layer exposed: Secondary | ICD-10-CM
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
## 2021-05-19 NOTE — Progress Notes (Signed)
IRVIN, LIZAMA ([id number]) Visit Report for 05/19/2021 Arrival Information Details Patient Name: Date of Service: Deanna Thompson 05/19/2021 11:00 [place] Record Number: [id number] Patient Account Number: [id number] Date of Birth/Sex: Treating RN: Feb 06, 1956 (66 y.o. Tonita Phoenix, Lauren Primary Care Raisa Ditto: Thersa Salt Other Clinician: Referring Paelyn Smick: Treating Naftali Carchi/Extender: Melanee Left Weeks in Treatment: 4 Visit Information History Since Last Visit Added or deleted any medications: No Patient Arrived: Ambulatory Any new allergies or adverse reactions: No Arrival Time: 11:18 Had a fall or experienced change in No Accompanied By: self activities of daily living that may affect Transfer Assistance: None risk of falls: Patient Identification Verified: Yes Signs or symptoms of abuse/neglect since last visito No Secondary Verification Process Completed: Yes Hospitalized since last visit: No Patient Requires Transmission-Based Precautions: No Implantable device outside of the clinic excluding No Patient Has Alerts: Yes cellular tissue based products placed in the center Patient Alerts: 04/17/21 ABI R 1.10 since last visit: 04/17/21 TBI R 0.62 Has Dressing in Place as Prescribed: Yes Pain Present Now: No Electronic Signature(s) Signed: 05/19/2021 5:21:26 PM By: Rhae Hammock RN Entered By: Rhae Hammock on 05/19/2021 11:18:34 -------------------------------------------------------------------------------- Clinic Level of Care Assessment Details Patient Name: Date of Service: Deanna Thompson 05/19/2021 11:00 [place] Record Number: [id number] Patient Account Number: [id number] Date of Birth/Sex: Treating RN: 07-Jun-1955 (66 y.o. Tonita Phoenix, Lauren Primary Care Sheala Dosh: Thersa Salt Other Clinician: Referring Cortasia Screws: Treating Burech Mcfarland/Extender: Melanee Left Weeks in Treatment: 4 Clinic Level of Care Assessment  Items TOOL 4 Quantity Score X- 1 0 Use when only an EandM is performed on FOLLOW-UP visit ASSESSMENTS - Nursing Assessment / Reassessment X- 1 10 Reassessment of Co-morbidities (includes updates in patient status) X- 1 5 Reassessment of Adherence to Treatment Plan ASSESSMENTS - Wound and Skin A ssessment / Reassessment X - Simple Wound Assessment / Reassessment - one wound 1 5 []  - 0 Complex Wound Assessment / Reassessment - multiple wounds []  - 0 Dermatologic / Skin Assessment (not related to wound area) ASSESSMENTS - Focused Assessment X- 1 5 Circumferential Edema Measurements - multi extremities []  - 0 Nutritional Assessment / Counseling / Intervention []  - 0 Lower Extremity Assessment (monofilament, tuning fork, pulses) []  - 0 Peripheral Arterial Disease Assessment (using hand held doppler) ASSESSMENTS - Ostomy and/or Continence Assessment and Care []  - 0 Incontinence Assessment and Management []  - 0 Ostomy Care Assessment and Management (repouching, etc.) PROCESS - Coordination of Care X - Simple Patient / Family Education for ongoing care 1 15 []  - 0 Complex (extensive) Patient / Family Education for ongoing care X- 1 10 Staff obtains Programmer, systems, Records, T Results / Process Orders est []  - 0 Staff telephones HHA, Nursing Homes / Clarify orders / etc []  - 0 Routine Transfer to another Facility (non-emergent condition) []  - 0 Routine Hospital Admission (non-emergent condition) []  - 0 New Admissions / Biomedical engineer / Ordering NPWT Apligraf, etc. , []  - 0 Emergency Hospital Admission (emergent condition) X- 1 10 Simple Discharge Coordination []  - 0 Complex (extensive) Discharge Coordination PROCESS - Special Needs []  - 0 Pediatric / Minor Patient Management []  - 0 Isolation Patient Management []  - 0 Hearing / Language / Visual special needs []  - 0 Assessment of Community assistance (transportation, D/C planning, etc.) []  - 0 Additional  assistance / Altered mentation []  - 0 Support Surface(s) Assessment (bed, cushion, seat, etc.) INTERVENTIONS - Wound Cleansing / Measurement X - Simple Wound Cleansing - one wound 1  5 []  - 0 Complex Wound Cleansing - multiple wounds X- 1 5 Wound Imaging (photographs - any number of wounds) []  - 0 Wound Tracing (instead of photographs) X- 1 5 Simple Wound Measurement - one wound []  - 0 Complex Wound Measurement - multiple wounds INTERVENTIONS - Wound Dressings []  - 0 Small Wound Dressing one or multiple wounds []  - 0 Medium Wound Dressing one or multiple wounds []  - 0 Large Wound Dressing one or multiple wounds []  - 0 Application of Medications - topical []  - 0 Application of Medications - injection INTERVENTIONS - Miscellaneous []  - 0 External ear exam []  - 0 Specimen Collection (cultures, biopsies, blood, body fluids, etc.) []  - 0 Specimen(s) / Culture(s) sent or taken to Lab for analysis []  - 0 Patient Transfer (multiple staff / Civil Service fast streamer / Similar devices) []  - 0 Simple Staple / Suture removal (25 or less) []  - 0 Complex Staple / Suture removal (26 or more) []  - 0 Hypo / Hyperglycemic Management (close monitor of Blood Glucose) []  - 0 Ankle / Brachial Index (ABI) - do not check if billed separately X- 1 5 Vital Signs Has the patient been seen at the hospital within the last three years: Yes Total Score: 80 Level Of Care: New/Established - Level 3 Electronic Signature(s) Signed: 05/19/2021 5:21:26 PM By: Rhae Hammock RN Entered By: Rhae Hammock on 05/19/2021 11:28:18 -------------------------------------------------------------------------------- Encounter Discharge Information Details Patient Name: Date of Service: Deanna Thompson [facility] 05/19/2021 11:00 [place] Record Number: [id number] Patient Account Number: [id number] Date of Birth/Sex: Treating RN: 1955-06-01 (66 y.o. Tonita Phoenix, Lauren Primary Care Iceis Knab: Thersa Salt Other  Clinician: Referring Clark Cuff: Treating Shailee Foots/Extender: Melanee Left Weeks in Treatment: 4 Encounter Discharge Information Items Discharge Condition: Stable Ambulatory Status: Ambulatory Discharge Destination: Home Transportation: Private Auto Accompanied By: self Schedule Follow-up Appointment: Yes Clinical Summary of Care: Patient Declined Electronic Signature(s) Signed: 05/19/2021 5:21:26 PM By: Rhae Hammock RN Entered By: Rhae Hammock on 05/19/2021 11:30:08 -------------------------------------------------------------------------------- Lower Extremity Assessment Details Patient Name: Date of Service: Deanna Thompson 05/19/2021 11:00 [place] Record Number: [id number] Patient Account Number: [id number] Date of Birth/Sex: Treating RN: 1955/12/10 (66 y.o. Tonita Phoenix, Lauren Primary Care Nanette Wirsing: Thersa Salt Other Clinician: Referring Kura Bethards: Treating Clariece Roesler/Extender: Melanee Left Weeks in Treatment: 4 Edema Assessment Assessed: [Left: No] [Right: No] Edema: [Left: N] [Right: o] Calf Left: Right: Point of Measurement: 36 cm From Medial Instep 35.5 cm Ankle Left: Right: Point of Measurement: 11 cm From Medial Instep 20 cm Electronic Signature(s) Signed: 05/19/2021 5:21:26 PM By: Rhae Hammock RN Entered By: Rhae Hammock on 05/19/2021 11:20:07 -------------------------------------------------------------------------------- Multi Wound Chart Details Patient Name: Date of Service: Deanna Thompson 05/19/2021 11:00 [place] Record Number: [id number] Patient Account Number: [id number] Date of Birth/Sex: Treating RN: 05-08-1955 (66 y.o. F) Primary Care Ichael Pullara: Thersa Salt Other Clinician: Referring Sally-Ann Cutbirth: Treating Raihana Balderrama/Extender: Melanee Left Weeks in Treatment: 4 Vital Signs Height(in): 68 Pulse(bpm): 18 Weight(lbs): 260 Blood Pressure(mmHg): 112/74 Body Mass Index(BMI):  39.5 Temperature(F): 97.7 Respiratory Rate(breaths/min): 17 Photos: [N/A:N/A] Right, Lateral Lower Leg N/A N/A Wound Location: Trauma N/A N/A Wounding Event: Trauma, Other N/A N/A Primary Etiology: Anemia, Hypertension, Cirrhosis , N/A N/A Comorbid History: Seizure Disorder 04/04/2021 N/A N/A Date Acquired: 4 N/A N/A Weeks of Treatment: Open N/A N/A Wound Status: No N/A N/A Wound Recurrence: Yes N/A N/A Clustered Wound: 1 N/A N/A Clustered Quantity: 0x0x0 N/A N/A Measurements L x W x D (cm) 0  N/A N/A A (cm) : rea 0 N/A N/A Volume (cm) : 100.00% N/A N/A % Reduction in Area: 100.00% N/A N/A % Reduction in Volume: Full Thickness Without Exposed N/A N/A Classification: Support Structures Medium N/A N/A Exudate Amount: Serosanguineous N/A N/A Exudate Type: red, brown N/A N/A Exudate Color: Distinct, outline attached N/A N/A Wound Margin: Large (67-100%) N/A N/A Granulation Amount: Pink N/A N/A Granulation Quality: Small (1-33%) N/A N/A Necrotic Amount: Fat Layer (Subcutaneous Tissue): Yes N/A N/A Exposed Structures: Fascia: No Tendon: No Muscle: No Joint: No Bone: No Large (67-100%) N/A N/A Epithelialization: Treatment Notes Wound #1 (Lower Leg) Wound Laterality: Right, Lateral Cleanser Peri-Wound Care Topical Primary Dressing Secondary Dressing Secured With Compression Wrap Compression Stockings Add-Ons Electronic Signature(s) Signed: 05/19/2021 11:45:01 AM By: Kalman Shan DO Entered By: Kalman Shan on 05/19/2021 11:31:37 -------------------------------------------------------------------------------- Multi-Disciplinary Care Plan Details Patient Name: Date of Service: Deanna Thompson [facility] 05/19/2021 11:00 [place] Medical Record Number: [id number] Patient Account Number: [id number] Date of Birth/Sex: Treating RN: 1955/05/17 (66 y.o. Tonita Phoenix, Lauren Primary Care Soleia Badolato: Thersa Salt Other Clinician: Referring  Grainger Mccarley: Treating Laretta Pyatt/Extender: Melanee Left Weeks in Treatment: 4 Active Inactive Electronic Signature(s) Signed: 05/19/2021 5:21:26 PM By: Rhae Hammock RN Entered By: Rhae Hammock on 05/19/2021 11:24:18 -------------------------------------------------------------------------------- Pain Assessment Details Patient Name: Date of Service: Deanna Thompson 05/19/2021 11:00 [place] Record Number: [id number] Patient Account Number: [id number] Date of Birth/Sex: Treating RN: 1955-10-25 (66 y.o. Tonita Phoenix, Lauren Primary Care Adanya Sosinski: Thersa Salt Other Clinician: Referring Azeez Dunker: Treating Detrick Dani/Extender: Melanee Left Weeks in Treatment: 4 Active Problems Location of Pain Severity and Description of Pain Patient Has Paino No Site Locations Pain Management and Medication Current Pain Management: Electronic Signature(s) Signed: 05/19/2021 5:21:26 PM By: Rhae Hammock RN Entered By: Rhae Hammock on 05/19/2021 11:19:08 -------------------------------------------------------------------------------- Patient/Caregiver Education Details Patient Name: Date of Service: Deanna Thompson 2/20/2023andnbsp11:00 Deanna Thompson Record Number: [id number] Patient Account Number: [id number] Date of Birth/Gender: Treating RN: [birth year] (66 y.o. Benjaman Lobe Primary Care Physician: Thersa Salt Other Clinician: Referring Physician: Treating Physician/Extender: Melanee Left Weeks in Treatment: 4 Education Assessment Education Provided To: Patient Education Topics Provided Wound/Skin Impairment: Methods: Explain/Verbal Responses: State content correctly Electronic Signature(s) Signed: 05/19/2021 5:21:26 PM By: Rhae Hammock RN Entered By: Rhae Hammock on 05/19/2021 11:26:51 -------------------------------------------------------------------------------- Wound Assessment Details Patient  Name: Date of Service: Deanna Thompson 05/19/2021 11:00 [place] Record Number: [id number] Patient Account Number: [id number] Date of Birth/Sex: Treating RN: [birth year] (66 y.o. Deanna Thompson, Tammi Klippel Primary Care Deane Wattenbarger: Thersa Salt Other Clinician: Referring Havanna Groner: Treating Kamela Blansett/Extender: Melanee Left Weeks in Treatment: 4 Wound Status Wound Number: 1 Primary Etiology: Trauma, Other Wound Location: Right, Lateral Lower Leg Wound Status: Open Wounding Event: Trauma Comorbid History: Anemia, Hypertension, Cirrhosis , Seizure Disorder Date Acquired: 04/04/2021 Weeks Of Treatment: 4 Clustered Wound: Yes Photos Wound Measurements Length: (cm) Width: (cm) Depth: (cm) Clustered Quantity: Area: (cm) Volume: (cm) 0 % Reduction in Area: 100% 0 % Reduction in Volume: 100% 0 Epithelialization: Large (67-100%) 1 0 0 Wound Description Classification: Full Thickness Without Exposed Support Structures Wound Margin: Distinct, outline attached Exudate Amount: Medium Exudate Type: Serosanguineous Exudate Color: red, brown Foul Odor After Cleansing: No Slough/Fibrino Yes Wound Bed Granulation Amount: Large (67-100%) Exposed Structure Granulation Quality: Pink Fascia Exposed: No Necrotic Amount: Small (1-33%) Fat Layer (Subcutaneous Tissue) Exposed: Yes Necrotic Quality: Adherent Slough Tendon Exposed: No Muscle Exposed: No Joint Exposed: No Bone Exposed: No  Electronic Signature(s) Signed: 05/19/2021 5:33:59 PM By: Deon Pilling RN, BSN Entered By: Deon Pilling on 05/19/2021 11:19:56 -------------------------------------------------------------------------------- Vitals Details Patient Name: Date of Service: Deanna Thompson 05/19/2021 11:00 [place] Record Number: [id number] Patient Account Number: [id number] Date of Birth/Sex: Treating RN: [birth year] (66 y.o. Tonita Phoenix, Lauren Primary Care Markiyah Gahm: Thersa Salt Other  Clinician: Referring Dajiah Kooi: Treating Elowen Debruyn/Extender: Melanee Left Weeks in Treatment: 4 Vital Signs Time Taken: 11:18 Temperature (F): 97.7 Height (in): 68 Pulse (bpm): 74 Weight (lbs): 260 Respiratory Rate (breaths/min): 17 Body Mass Index (BMI): 39.5 Blood Pressure (mmHg): 112/74 Reference Range: 80 - 120 mg / dl Electronic Signature(s) Signed: 05/19/2021 5:21:26 PM By: Rhae Hammock RN Entered By: Rhae Hammock on 05/19/2021 11:18:49

## 2021-05-26 ENCOUNTER — Other Ambulatory Visit: Payer: Self-pay

## 2021-05-26 ENCOUNTER — Inpatient Hospital Stay (HOSPITAL_COMMUNITY): Payer: Medicare HMO | Attending: Hematology

## 2021-05-26 DIAGNOSIS — D500 Iron deficiency anemia secondary to blood loss (chronic): Secondary | ICD-10-CM | POA: Insufficient documentation

## 2021-05-26 DIAGNOSIS — K922 Gastrointestinal hemorrhage, unspecified: Secondary | ICD-10-CM | POA: Insufficient documentation

## 2021-05-26 LAB — CBC WITH DIFFERENTIAL/PLATELET
Abs Immature Granulocytes: 0.02 10*3/uL (ref 0.00–0.07)
Basophils Absolute: 0 10*3/uL (ref 0.0–0.1)
Basophils Relative: 1 %
Eosinophils Absolute: 0.1 10*3/uL (ref 0.0–0.5)
Eosinophils Relative: 2 %
HCT: 38.7 % (ref 36.0–46.0)
Hemoglobin: 12.6 g/dL (ref 12.0–15.0)
Immature Granulocytes: 0 %
Lymphocytes Relative: 4 %
Lymphs Abs: 0.2 10*3/uL — ABNORMAL LOW (ref 0.7–4.0)
MCH: 29.4 pg (ref 26.0–34.0)
MCHC: 32.6 g/dL (ref 30.0–36.0)
MCV: 90.4 fL (ref 80.0–100.0)
Monocytes Absolute: 0.5 10*3/uL (ref 0.1–1.0)
Monocytes Relative: 10 %
Neutro Abs: 4.4 10*3/uL (ref 1.7–7.7)
Neutrophils Relative %: 83 %
Platelets: 275 10*3/uL (ref 150–400)
RBC: 4.28 MIL/uL (ref 3.87–5.11)
RDW: 16.8 % — ABNORMAL HIGH (ref 11.5–15.5)
WBC: 5.3 10*3/uL (ref 4.0–10.5)
nRBC: 0 % (ref 0.0–0.2)

## 2021-05-26 LAB — IRON AND TIBC
Iron: 59 ug/dL (ref 28–170)
Saturation Ratios: 13 % (ref 10.4–31.8)
TIBC: 472 ug/dL — ABNORMAL HIGH (ref 250–450)
UIBC: 413 ug/dL

## 2021-05-26 LAB — FERRITIN: Ferritin: 20 ng/mL (ref 11–307)

## 2021-05-28 ENCOUNTER — Ambulatory Visit (INDEPENDENT_AMBULATORY_CARE_PROVIDER_SITE_OTHER): Payer: Medicare HMO | Admitting: Family Medicine

## 2021-05-28 ENCOUNTER — Other Ambulatory Visit: Payer: Self-pay

## 2021-05-28 VITALS — BP 118/64 | HR 84 | Temp 98.4°F | Wt 253.6 lb

## 2021-05-28 DIAGNOSIS — R051 Acute cough: Secondary | ICD-10-CM | POA: Diagnosis not present

## 2021-05-28 MED ORDER — BENZONATATE 200 MG PO CAPS: 200.0000 mg | ORAL_CAPSULE | Freq: Three times a day (TID) | ORAL | 0 refills | Status: DC | PRN

## 2021-05-28 MED ORDER — AZITHROMYCIN 250 MG PO TABS: ORAL_TABLET | ORAL | 0 refills | Status: DC

## 2021-05-28 NOTE — Progress Notes (Signed)
? ?Subjective:  ?Patient ID: Deanna Thompson, female    DOB: 1956/02/12  Age: 66 y.o. MRN: [id number] ? ?CC: ?Chief Complaint  ?Patient presents with  ? Cough  ?  Tested negative for Covid last night.  ? Nasal Congestion  ?  Not feeling 100%.  ? ? ?HPI: ? ?66 year old female presents with cough. ? ?3-day history of cough.  Has improved with use of [place] DM.  Reports some congestion.  No fever.  No shortness of breath.  She is unsure whether this is viral or whether this is developing bronchitis.  Patient has a history of recurrent bronchitis.  No other respiratory symptoms.  No other complaints. ? ?Patient Active Problem List  ? Diagnosis Date Noted  ? Acute cough 05/28/2021  ? Bipolar disorder ([place]) 01/13/2021  ? Hyperlipidemia 01/13/2021  ? Decreased cardiac ejection fraction 01/13/2021  ? IBS (irritable bowel syndrome)   ? History of stroke 08/01/2017  ? Essential hypertension 07/31/2017  ? GERD (gastroesophageal reflux disease) 07/31/2017  ? Brain aneurysm 07/31/2017  ? CKD (chronic kidney disease), stage III ([place]) 07/31/2017  ? LBBB (left bundle branch block) 07/31/2017  ? Psoriatic arthritis ([employer]) 04/15/2015  ? Cirrhosis of liver ([place]) 09/19/2014  ? Iron deficiency anemia due to chronic blood loss   ? Insomnia 03/17/2014  ? ? ?Social Hx   ?Social History  ? ?Socioeconomic History  ? Marital status: Married  ?  Spouse name: Kris Mouton  ? Number of children: 0  ? Years of education: Not on file  ? Highest education level: Not on file  ?Occupational History  ?  Employer: RETIRED  ?Tobacco Use  ? Smoking status: Former  ?  Packs/day: 2.00  ?  Years: 12.00  ?  Pack years: 24.00  ?  Types: Cigarettes  ?  Quit date: 03/31/1995  ?  Years since quitting: 26.1  ? Smokeless tobacco: Never  ? Tobacco comments:  ?  quit in 1997  ?Vaping Use  ? Vaping Use: Never used  ?Substance and Sexual Activity  ? Alcohol use: No  ?  Comment: Bottle of wine a day for years, but quit in 07/2011  ? Drug use: No  ? Sexual activity: Not  Currently  ?  Birth control/protection: Post-menopausal  ?Other Topics Concern  ? Not on file  ?Social History Narrative  ? Lives  ? Caffeine use:   ? Quit drinking alcohol 3 YEARS ago. No drugs, no IVDU.   ? ?Social Determinants of Health  ? ?Financial Resource Strain: Not on file  ?Food Insecurity: Not on file  ?Transportation Needs: Not on file  ?Physical Activity: Not on file  ?Stress: Not on file  ?Social Connections: Not on file  ? ? ?Review of Systems ?Per HPI ? ?Objective:  ?BP 118/64   Pulse 84   Temp 98.4 ?F (36.9 ?C) (Oral)   Wt 253 lb 9.6 oz (115 kg)   SpO2 99%   BMI 38.56 kg/m?  ? ?BP/Weight 05/28/2021 05/08/2021 04/24/2021  ?Systolic BP 270 623 762  ?Diastolic BP 64 79 79  ?Wt. (Lbs) 253.6 255 255  ?BMI 38.56 38.77 38.77  ? ? ?Physical Exam ?Vitals and nursing note reviewed.  ?Constitutional:   ?   General: She is not in acute distress. ?   Appearance: Normal appearance. She is obese.  ?HENT:  ?   Head: Normocephalic and atraumatic.  ?   Mouth/Throat:  ?   Pharynx: Oropharynx is clear.  ?Cardiovascular:  ?   Rate and Rhythm:  Normal rate and regular rhythm.  ?Pulmonary:  ?   Effort: Pulmonary effort is normal.  ?   Breath sounds: Normal breath sounds. No wheezing, rhonchi or rales.  ?Neurological:  ?   Mental Status: She is alert.  ? ? ?Lab Results  ?Component Value Date  ? WBC 5.3 05/26/2021  ? HGB 12.6 05/26/2021  ? HCT 38.7 05/26/2021  ? PLT 275 05/26/2021  ? GLUCOSE 97 01/14/2021  ? CHOL 160 01/14/2021  ? TRIG 126 01/14/2021  ? HDL 79 01/14/2021  ? [place] 59 01/14/2021  ? ALT 24 01/14/2021  ? AST 30 01/14/2021  ? NA 139 01/14/2021  ? K 4.4 01/14/2021  ? CL 96 01/14/2021  ? CREATININE 1.24 (H) 01/14/2021  ? BUN 14 01/14/2021  ? CO2 24 01/14/2021  ? TSH 8.256 (H) 08/25/2011  ? INR 1.2 03/14/2019  ? HGBA1C 5.8 (H) 08/01/2017  ? ? ? ?Assessment & Plan:  ? ?Problem List Items Addressed This Visit   ? ?  ? Other  ? Acute cough - Primary  ?  Likely viral.  Treating with Tessalon Perles.  If fails to  improve or worsens, she can start the antibiotic that was provided (wait and see).  ?  ?  ? ? ?Meds ordered this encounter  ?Medications  ? azithromycin (ZITHROMAX) 250 MG tablet  ?  Sig: 2 tablets on day 1, then 1 tablet daily on days 2-5.  ?  Dispense:  6 tablet  ?  Refill:  0  ? benzonatate (TESSALON) 200 MG capsule  ?  Sig: Take 1 capsule (200 mg total) by mouth 3 (three) times daily as needed for cough.  ?  Dispense:  30 capsule  ?  Refill:  0  ? ?Thersa Salt DO ?[place] ? ?

## 2021-05-28 NOTE — Progress Notes (Signed)
? ?Virtual Visit via Telephone Note ?[place] ? ?I connected with [place]  on 05/29/21 at  11:08 AM by telephone and verified that I am speaking with the correct person using two identifiers. ? ?Location: ?Patient: Home ?Provider: [place] ?  ?I discussed the limitations, risks, security and privacy concerns of performing an evaluation and management service by telephone and the availability of in person appointments. I also discussed with the patient that there may be a patient responsible charge related to this service. The patient expressed understanding and agreed to proceed. ? ? ?REASON FOR VISIT:  ?Follow-up for iron deficiency anemia ? ?CURRENT THERAPY: Intermittent IV iron infusions ? ?INTERVAL HISTORY:  ?Deanna Thompson 66 y.o. female returns for routine follow-up of her iron deficiency anemia.  She was last seen by Tarri Abernethy PA-C on 01/23/2021. ? ?At today's visit, she reports feeling somewhat poorly due to current upper respiratory versus allergy symptoms.  She was seen yesterday for her PCP due to her cough, sinus drainage, and sore throat. ? ?Regarding her iron deficiency anemia, she did notice improved symptoms after her IV iron in December 2022, with resolution of her fatigue, pica, and dyspnea on exertion.  However, over the past month and a half she has had progressive fatigue that has returned.  She denies any pica, restless legs, headaches, chest pain, dyspnea on exertion, lightheadedness, or syncope. ?She has a history of bleeding AVMs, but has not had any recent hematemesis, hematochezia, or melena.   ? ?She has poor energy and low appetite. She endorses that she is maintaining a stable weight. ? ?  ?OBSERVATIONS/OBJECTIVE: ?Review of Systems  ?Constitutional:  Positive for malaise/fatigue. Negative for chills, diaphoresis, fever and weight loss.  ?HENT:  Positive for congestion and sore throat.   ?Respiratory:  Positive for cough. Negative for  shortness of breath.   ?Cardiovascular:  Negative for chest pain and palpitations.  ?Gastrointestinal:  Positive for diarrhea. Negative for abdominal pain, blood in stool, melena, nausea and vomiting.  ?Neurological:  Positive for dizziness (Positional vertigo). Negative for headaches.   ? ?PHYSICAL EXAM (per limitations of virtual telephone visit): The patient is alert and oriented x 3, exhibiting adequate mentation, good mood, and ability to speak in full sentences and execute sound judgement. ? ? ?ASSESSMENT & PLAN: ?1.  Iron deficiency anemia due to chronic blood loss ?- Due to chronic GI blood loss. 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  Also showed polypoid lesions in the duodenum, single duodenum AVM. ?- Her colonoscopy on 01/17/2013 showed rectal varices, polyps and hemorrhoids. ?- She had a flex sigmoidoscopy with Dr. Oneida Alar on 01/21/2016 showing rectal bleeding due to proctitis and possibly internal hemorrhoids. ?- Patient was hospitalized in December 2020 for an AVM bleed where her hemoglobin dropped to 7.7. ?- EGD done on 03/16/2019 showed GI bleed due to gastric AVMs.  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  Injected.  Treated with bipolar cautery.  Clips were placed. ?- She has been transfused RBCs in the past ?- She has been referred to gastroenterology, and previously followed with Dr. Oneida Alar ?- She last received IV Venofer x3 on 02/07/2021 through 02/28/2021 ?- No bright red blood per rectum or melena ?- Symptomatic with significant progressive fatigue ?- Most recent labs (05/26/2021): Hgb 12.6/MCV 90.4, ferritin 20, iron saturation 13% with TIBC 472 ?- PLAN: Recommend IV iron supplementation with IV Venofer 300  MG x 3 ?--  Repeat anemia panel and RTC (phone visit) in 3 months.  (We will not premedicate - although patient has multiple medication allergies listed, she has previously tolerated Venofer well without  premedication.) ?  ?2.  Other history ?- Her other pertinent medical history includes CKD stage IIIa,  liver cirrhosis, history of cerebral aneurysm with stroke, hyperlipidemia, and hypertension  ? ? ?FOLLOW UP INSTRUCTIONS: ?Venofer 300 mg x 3 ?Labs and RTC in 3 months ? ?  ?I discussed the assessment and treatment plan with the patient. The patient was provided an opportunity to ask questions and all were answered. The patient agreed with the plan and demonstrated an understanding of the instructions. ?  ?The patient was advised to call back or seek an in-person evaluation if the symptoms worsen or if the condition fails to improve as anticipated. ? ?I provided 17 minutes of non-face-to-face time during this encounter. ? ? ?Harriett Rush, PA-C ?05/29/2021 11:19 AM ?

## 2021-05-28 NOTE — Assessment & Plan Note (Signed)
Likely viral.  Treating with Tessalon Perles.  If fails to improve or worsens, she can start the antibiotic that was provided (wait and see).  ?

## 2021-05-28 NOTE — Patient Instructions (Signed)
Medication as directed. ? ?If you fail to improve or worsen, start the antibiotic. ? ?Take care ? ? ?Dr. Lacinda Axon  ?

## 2021-05-29 ENCOUNTER — Inpatient Hospital Stay (HOSPITAL_COMMUNITY): Payer: Medicare HMO | Attending: Physician Assistant | Admitting: Physician Assistant

## 2021-05-29 DIAGNOSIS — K922 Gastrointestinal hemorrhage, unspecified: Secondary | ICD-10-CM | POA: Insufficient documentation

## 2021-05-29 DIAGNOSIS — N1831 Chronic kidney disease, stage 3a: Secondary | ICD-10-CM | POA: Insufficient documentation

## 2021-05-29 DIAGNOSIS — E785 Hyperlipidemia, unspecified: Secondary | ICD-10-CM | POA: Insufficient documentation

## 2021-05-29 DIAGNOSIS — K7460 Unspecified cirrhosis of liver: Secondary | ICD-10-CM | POA: Insufficient documentation

## 2021-05-29 DIAGNOSIS — R197 Diarrhea, unspecified: Secondary | ICD-10-CM | POA: Insufficient documentation

## 2021-05-29 DIAGNOSIS — D500 Iron deficiency anemia secondary to blood loss (chronic): Secondary | ICD-10-CM | POA: Diagnosis not present

## 2021-05-29 DIAGNOSIS — R42 Dizziness and giddiness: Secondary | ICD-10-CM | POA: Insufficient documentation

## 2021-05-29 DIAGNOSIS — R5383 Other fatigue: Secondary | ICD-10-CM | POA: Insufficient documentation

## 2021-05-29 DIAGNOSIS — I10 Essential (primary) hypertension: Secondary | ICD-10-CM | POA: Insufficient documentation

## 2021-05-29 DIAGNOSIS — Z79899 Other long term (current) drug therapy: Secondary | ICD-10-CM | POA: Insufficient documentation

## 2021-05-29 DIAGNOSIS — J029 Acute pharyngitis, unspecified: Secondary | ICD-10-CM | POA: Insufficient documentation

## 2021-05-29 DIAGNOSIS — R0981 Nasal congestion: Secondary | ICD-10-CM | POA: Insufficient documentation

## 2021-05-29 DIAGNOSIS — Z8673 Personal history of transient ischemic attack (TIA), and cerebral infarction without residual deficits: Secondary | ICD-10-CM | POA: Insufficient documentation

## 2021-05-29 DIAGNOSIS — Z8679 Personal history of other diseases of the circulatory system: Secondary | ICD-10-CM | POA: Insufficient documentation

## 2021-06-03 ENCOUNTER — Encounter: Payer: Self-pay | Admitting: Adult Health

## 2021-06-03 ENCOUNTER — Other Ambulatory Visit: Payer: Self-pay

## 2021-06-03 ENCOUNTER — Ambulatory Visit: Payer: Medicare HMO | Admitting: Adult Health

## 2021-06-03 VITALS — BP 136/74 | HR 82 | Ht 68.0 in | Wt 251.2 lb

## 2021-06-03 DIAGNOSIS — R300 Dysuria: Secondary | ICD-10-CM | POA: Diagnosis not present

## 2021-06-03 DIAGNOSIS — R399 Unspecified symptoms and signs involving the genitourinary system: Secondary | ICD-10-CM | POA: Diagnosis not present

## 2021-06-03 DIAGNOSIS — R319 Hematuria, unspecified: Secondary | ICD-10-CM | POA: Insufficient documentation

## 2021-06-03 LAB — POCT URINALYSIS DIPSTICK OB
Glucose, UA: NEGATIVE
Ketones, UA: NEGATIVE
Nitrite, UA: NEGATIVE

## 2021-06-03 MED ORDER — SULFAMETHOXAZOLE-TRIMETHOPRIM 800-160 MG PO TABS: 1.0000 | ORAL_TABLET | Freq: Two times a day (BID) | ORAL | 0 refills | Status: DC

## 2021-06-03 MED ORDER — PHENAZOPYRIDINE HCL 200 MG PO TABS: 200.0000 mg | ORAL_TABLET | Freq: Three times a day (TID) | ORAL | 0 refills | Status: DC | PRN

## 2021-06-03 NOTE — Progress Notes (Signed)
?  Subjective:  ?  ? Patient ID: Deanna Thompson, female   DOB: 29-Sep-1955, 66 y.o.   MRN: [id number] ? ?HPI ?Ginger is a 66 year old white female, married, PM, worked in for pain with urination that started last night. ?PCP is Dr Lacinda Axon ?Lab Results  ?Component Value Date  ? DIAGPAP  12/06/2017  ?  NEGATIVE FOR INTRAEPITHELIAL LESIONS OR MALIGNANCY.  ? HPV NOT DETECTED 12/06/2017  ?  ? ?Review of Systems ?Has pain with urination that started last night ?Has cough, had cold and saw Dr Lacinda Axon recently ?Reviewed past medical,surgical, social and family history. Reviewed medications and allergies.  ?   ?Objective:  ? Physical Exam ?BP 136/74 (BP Location: Right Arm, Patient Position: Sitting, Cuff Size: Normal)   Pulse 82   Ht '5\' 8"'$  (1.727 m)   Wt 251 lb 3.2 oz (113.9 kg)   BMI 38.19 kg/m?  urine dipstick is large blood, mod leuks and trace protein ?Skin warm and dry. Lungs: clear to ausculation bilaterally. Cardiovascular: regular rate and rhythm.  ?  NO CVAT, no tenderness over bladder ? Upstream - 06/03/21 1338   ? ?  ? Pregnancy Intention Screening  ? Does the patient want to become pregnant in the next year? N/A   ? Does the patient's partner want to become pregnant in the next year? N/A   ? Would the patient like to discuss contraceptive options today? N/A   ?  ? Contraception Wrap Up  ? Current Method No Method - Other Reason   postmenopausal  ? End Method No Method - Other Reason   ? Contraception Counseling Provided No   ? ?  ?  ? ?  ?  ?Assessment:  ?   ? 1. Symptoms of urinary tract infection ?Will check urine for UA C&S to rule out UTI ?- POC Urinalysis Dipstick OB ?- Urinalysis, Routine w reflex microscopic ?- Urine Culture ? ?2. Burning with urination ?Will rx septra ds and Pyridium ?Meds ordered this encounter  ?Medications  ? sulfamethoxazole-trimethoprim (BACTRIM DS) 800-160 MG tablet  ?  Sig: Take 1 tablet by mouth 2 (two) times daily. Take 1 bid  ?  Dispense:  14 tablet  ?  Refill:  0  ?  Order  Specific Question:   Supervising Provider  ?  Answer:   Tania Ade H [[id number]]  ? phenazopyridine (PYRIDIUM) 200 MG tablet  ?  Sig: Take 1 tablet (200 mg total) by mouth 3 (three) times daily as needed for pain.  ?  Dispense:  10 tablet  ?  Refill:  0  ?  Order Specific Question:   Supervising Provider  ?  Answer:   Tania Ade H [[id number]]  ? Push water ?- Urinalysis, Routine w reflex microscopic ?- Urine Culture ? ?3. Hematuria, unspecified type ?- Urinalysis, Routine w reflex microscopic ?- Urine Culture  ?   ?Plan:  ?   ?Follow up prn  ?Will back in for pap when feeling better  ?   ?

## 2021-06-04 LAB — URINALYSIS, ROUTINE W REFLEX MICROSCOPIC
Bilirubin, UA: NEGATIVE
Glucose, UA: NEGATIVE
Ketones, UA: NEGATIVE
Nitrite, UA: NEGATIVE
Specific Gravity, UA: 1.02 (ref 1.005–1.030)
Urobilinogen, Ur: 0.2 mg/dL (ref 0.2–1.0)
pH, UA: 6 (ref 5.0–7.5)

## 2021-06-04 LAB — MICROSCOPIC EXAMINATION
Bacteria, UA: NONE SEEN
Casts: NONE SEEN /lpf
Epithelial Cells (non renal): NONE SEEN /hpf (ref 0–10)
RBC, Urine: NONE SEEN /hpf (ref 0–2)
WBC, UA: >30 /hpf — AB (ref 0–5)

## 2021-06-06 ENCOUNTER — Inpatient Hospital Stay (HOSPITAL_COMMUNITY): Payer: Medicare HMO

## 2021-06-06 ENCOUNTER — Other Ambulatory Visit: Payer: Self-pay

## 2021-06-06 ENCOUNTER — Encounter (HOSPITAL_COMMUNITY): Payer: Self-pay

## 2021-06-06 VITALS — BP 143/65 | HR 78 | Temp 97.5°F | Resp 18

## 2021-06-06 DIAGNOSIS — Z79899 Other long term (current) drug therapy: Secondary | ICD-10-CM | POA: Diagnosis not present

## 2021-06-06 DIAGNOSIS — R0981 Nasal congestion: Secondary | ICD-10-CM | POA: Diagnosis not present

## 2021-06-06 DIAGNOSIS — D500 Iron deficiency anemia secondary to blood loss (chronic): Secondary | ICD-10-CM

## 2021-06-06 DIAGNOSIS — I10 Essential (primary) hypertension: Secondary | ICD-10-CM | POA: Diagnosis not present

## 2021-06-06 DIAGNOSIS — R42 Dizziness and giddiness: Secondary | ICD-10-CM | POA: Diagnosis not present

## 2021-06-06 DIAGNOSIS — Z8679 Personal history of other diseases of the circulatory system: Secondary | ICD-10-CM | POA: Diagnosis not present

## 2021-06-06 DIAGNOSIS — K7460 Unspecified cirrhosis of liver: Secondary | ICD-10-CM | POA: Diagnosis not present

## 2021-06-06 DIAGNOSIS — Z8673 Personal history of transient ischemic attack (TIA), and cerebral infarction without residual deficits: Secondary | ICD-10-CM | POA: Diagnosis not present

## 2021-06-06 DIAGNOSIS — E785 Hyperlipidemia, unspecified: Secondary | ICD-10-CM | POA: Diagnosis not present

## 2021-06-06 DIAGNOSIS — R197 Diarrhea, unspecified: Secondary | ICD-10-CM | POA: Diagnosis not present

## 2021-06-06 DIAGNOSIS — R5383 Other fatigue: Secondary | ICD-10-CM | POA: Diagnosis not present

## 2021-06-06 DIAGNOSIS — K922 Gastrointestinal hemorrhage, unspecified: Secondary | ICD-10-CM | POA: Diagnosis not present

## 2021-06-06 DIAGNOSIS — N1831 Chronic kidney disease, stage 3a: Secondary | ICD-10-CM | POA: Diagnosis not present

## 2021-06-06 DIAGNOSIS — J029 Acute pharyngitis, unspecified: Secondary | ICD-10-CM | POA: Diagnosis not present

## 2021-06-06 LAB — URINE CULTURE

## 2021-06-06 MED ORDER — ACETAMINOPHEN 325 MG PO TABS
650.0000 mg | ORAL_TABLET | Freq: Once | ORAL | Status: AC
  Administered 2021-06-06: 650 mg via ORAL
  Filled 2021-06-06: qty 2

## 2021-06-06 MED ORDER — LORATADINE 10 MG PO TABS
10.0000 mg | ORAL_TABLET | Freq: Once | ORAL | Status: AC
  Administered 2021-06-06: 10 mg via ORAL
  Filled 2021-06-06: qty 1

## 2021-06-06 MED ORDER — SODIUM CHLORIDE 0.9 % IV SOLN: Freq: Once | INTRAVENOUS | Status: AC

## 2021-06-06 MED ORDER — SODIUM CHLORIDE 0.9 % IV SOLN
300.0000 mg | Freq: Once | INTRAVENOUS | Status: AC
  Administered 2021-06-06: 300 mg via INTRAVENOUS
  Filled 2021-06-06: qty 300

## 2021-06-06 NOTE — Progress Notes (Signed)
Iron infusion given per orders. Patient tolerated it well without problems. Vitals stable and discharged home from clinic ambulatory. Follow up as scheduled.  

## 2021-06-06 NOTE — Patient Instructions (Signed)
[facility]  Discharge Instructions: ?Thank you for choosing [facility] to provide your oncology and hematology care.  ?If you have a lab appointment with the Cancer Center, please come in thru the Main Entrance and check in at the main information desk. ? ? ? ?We strive to give you quality time with your provider. You may need to reschedule your appointment if you arrive late (15 or more minutes).  Arriving late affects you and other patients whose appointments are after yours.  Also, if you miss three or more appointments without notifying the office, you may be dismissed from the clinic at the provider?s discretion.    ?  ?For prescription refill requests, have your pharmacy contact our office and allow 72 hours for refills to be completed.   ? ?  ?To help prevent nausea and vomiting after your treatment, we encourage you to take your nausea medication as directed. ? ?BELOW ARE SYMPTOMS THAT SHOULD BE REPORTED IMMEDIATELY: ?*FEVER GREATER THAN 100.4 F (38 ?C) OR HIGHER ?*CHILLS OR SWEATING ?*NAUSEA AND VOMITING THAT IS NOT CONTROLLED WITH YOUR NAUSEA MEDICATION ?*UNUSUAL SHORTNESS OF BREATH ?*UNUSUAL BRUISING OR BLEEDING ?*URINARY PROBLEMS (pain or burning when urinating, or frequent urination) ?*BOWEL PROBLEMS (unusual diarrhea, constipation, pain near the anus) ?TENDERNESS IN MOUTH AND THROAT WITH OR WITHOUT PRESENCE OF ULCERS (sore throat, sores in mouth, or a toothache) ?UNUSUAL RASH, SWELLING OR PAIN  ?UNUSUAL VAGINAL DISCHARGE OR ITCHING  ? ?Items with * indicate a potential emergency and should be followed up as soon as possible or go to the Emergency Department if any problems should occur. ? ?Please show the CHEMOTHERAPY ALERT CARD or IMMUNOTHERAPY ALERT CARD at check-in to the Emergency Department and triage nurse. ? ?Should you have questions after your visit or need to cancel or reschedule your appointment, please contact [facility] [phone]  and follow  the prompts.  Office hours are 8:00 a.m. to 4:30 p.m. Monday - Friday. Please note that voicemails left after 4:00 p.m. may not be returned until the following business day.  We are closed weekends and major holidays. You have access to a nurse at all times for urgent questions. Please call the main number to the clinic [phone] and follow the prompts. ? ?For any non-urgent questions, you may also contact your provider using MyChart. We now offer e-Visits for anyone 18 and older to request care online for non-urgent symptoms. For details visit [email or web address]. ?  ?Also download the MyChart app! Go to the app store, search "MyChart", open the app, select [facility], and log in with your MyChart username and password. ? ?Due to Covid, a mask is required upon entering the hospital/clinic. If you do not have a mask, one will be given to you upon arrival. For doctor visits, patients may have 1 support person aged 18 or older with them. For treatment visits, patients cannot have anyone with them due to current Covid guidelines and our immunocompromised population.  ?

## 2021-06-11 ENCOUNTER — Other Ambulatory Visit: Payer: Self-pay | Admitting: Gastroenterology

## 2021-06-11 NOTE — Telephone Encounter (Signed)
Refilled but needs office visit.  °

## 2021-06-11 NOTE — Telephone Encounter (Signed)
Last office visit 02/08/20 ?

## 2021-06-13 ENCOUNTER — Ambulatory Visit (HOSPITAL_COMMUNITY): Payer: Medicare HMO

## 2021-06-16 ENCOUNTER — Telehealth: Payer: Self-pay | Admitting: Gastroenterology

## 2021-06-16 ENCOUNTER — Telehealth: Payer: Self-pay

## 2021-06-16 NOTE — Telephone Encounter (Signed)
Hi Dr. Ardis Hughs, ?  ?D.O.D ?  ?We received a referral for patient to be evaluated for Alcoholic cirrhosis of liver without ascites. Is a current patient of [facility], states her doctor is no longer there. Requesting a transfer of care. Her records are available on epic and ready for you to review. Please advise on scheduling.  ?  ?Thank you.  ?

## 2021-06-16 NOTE — Telephone Encounter (Addendum)
ERROR

## 2021-06-17 NOTE — Telephone Encounter (Signed)
Called patient to advise of information below, left voicemail. ?

## 2021-06-19 ENCOUNTER — Telehealth: Payer: Self-pay | Admitting: Family Medicine

## 2021-06-19 NOTE — Telephone Encounter (Signed)
?  Left message for patient to call back and schedule Medicare Annual Wellness Visit (AWV) in office.  ? ?If unable to come into the office for AWV,  please offer to do virtually or by telephone. ? ?No hx of AWV eligible for AWVI per [facility] as of  11/28/2020 ? ?Please schedule at anytime with [facility] Advisor.     ? ?45 minute appointment  ? ?Any questions, please call me at [phone]   ?

## 2021-06-20 ENCOUNTER — Other Ambulatory Visit: Payer: Self-pay

## 2021-06-20 ENCOUNTER — Inpatient Hospital Stay (HOSPITAL_COMMUNITY): Payer: Medicare HMO

## 2021-06-20 VITALS — BP 119/73 | HR 79 | Temp 98.0°F | Resp 18

## 2021-06-20 DIAGNOSIS — D500 Iron deficiency anemia secondary to blood loss (chronic): Secondary | ICD-10-CM

## 2021-06-20 MED ORDER — SODIUM CHLORIDE 0.9 % IV SOLN
300.0000 mg | Freq: Once | INTRAVENOUS | Status: AC
  Administered 2021-06-20: 300 mg via INTRAVENOUS
  Filled 2021-06-20: qty 300

## 2021-06-20 MED ORDER — LORATADINE 10 MG PO TABS
10.0000 mg | ORAL_TABLET | Freq: Once | ORAL | Status: AC
  Administered 2021-06-20: 10 mg via ORAL
  Filled 2021-06-20: qty 1

## 2021-06-20 MED ORDER — ACETAMINOPHEN 325 MG PO TABS
650.0000 mg | ORAL_TABLET | Freq: Once | ORAL | Status: AC
  Administered 2021-06-20: 650 mg via ORAL
  Filled 2021-06-20: qty 2

## 2021-06-20 MED ORDER — SODIUM CHLORIDE 0.9 % IV SOLN: Freq: Once | INTRAVENOUS | Status: AC

## 2021-06-20 NOTE — Progress Notes (Signed)
Patient presents today for Venofer infusion per providers order.  Vital signs WNL.  Patient has no new complaints at this time. ? ?Peripheral IV started and blood return noted pre and post infusion. ? ?Venofer given today per MD orders.  Tolerated infusion without adverse affects.  Vital signs stable.  No complaints at this time.  Discharge from clinic ambulatory in stable condition.  Alert and oriented X 3.  Follow up with [facility] as scheduled.  ?

## 2021-06-20 NOTE — Patient Instructions (Signed)
[facility]  Discharge Instructions: Thank you for choosing [facility] to provide your oncology and hematology care.  If you have a lab appointment with the Cancer Center, please come in thru the Main Entrance and check in at the main information desk.  Wear comfortable clothing and clothing appropriate for easy access to any Portacath or PICC line.   We strive to give you quality time with your provider. You may need to reschedule your appointment if you arrive late (15 or more minutes).  Arriving late affects you and other patients whose appointments are after yours.  Also, if you miss three or more appointments without notifying the office, you may be dismissed from the clinic at the provider's discretion.      For prescription refill requests, have your pharmacy contact our office and allow 72 hours for refills to be completed.    Today you received the following chemotherapy and/or immunotherapy agents Venofer      To help prevent nausea and vomiting after your treatment, we encourage you to take your nausea medication as directed.  BELOW ARE SYMPTOMS THAT SHOULD BE REPORTED IMMEDIATELY: *FEVER GREATER THAN 100.4 F (38 C) OR HIGHER *CHILLS OR SWEATING *NAUSEA AND VOMITING THAT IS NOT CONTROLLED WITH YOUR NAUSEA MEDICATION *UNUSUAL SHORTNESS OF BREATH *UNUSUAL BRUISING OR BLEEDING *URINARY PROBLEMS (pain or burning when urinating, or frequent urination) *BOWEL PROBLEMS (unusual diarrhea, constipation, pain near the anus) TENDERNESS IN MOUTH AND THROAT WITH OR WITHOUT PRESENCE OF ULCERS (sore throat, sores in mouth, or a toothache) UNUSUAL RASH, SWELLING OR PAIN  UNUSUAL VAGINAL DISCHARGE OR ITCHING   Items with * indicate a potential emergency and should be followed up as soon as possible or go to the Emergency Department if any problems should occur.  Please show the CHEMOTHERAPY ALERT CARD or IMMUNOTHERAPY ALERT CARD at check-in to the Emergency  Department and triage nurse.  Should you have questions after your visit or need to cancel or reschedule your appointment, please contact [facility] [phone]  and follow the prompts.  Office hours are 8:00 a.m. to 4:30 p.m. Monday - Friday. Please note that voicemails left after 4:00 p.m. may not be returned until the following business day.  We are closed weekends and major holidays. You have access to a nurse at all times for urgent questions. Please call the main number to the clinic [phone] and follow the prompts.  For any non-urgent questions, you may also contact your provider using MyChart. We now offer e-Visits for anyone 18 and older to request care online for non-urgent symptoms. For details visit [email or web address].   Also download the MyChart app! Go to the app store, search "MyChart", open the app, select [facility], and log in with your MyChart username and password.  Due to Covid, a mask is required upon entering the hospital/clinic. If you do not have a mask, one will be given to you upon arrival. For doctor visits, patients may have 1 support person aged 18 or older with them. For treatment visits, patients cannot have anyone with them due to current Covid guidelines and our immunocompromised population.  

## 2021-06-24 ENCOUNTER — Other Ambulatory Visit: Payer: Self-pay | Admitting: Gastroenterology

## 2021-06-24 NOTE — Telephone Encounter (Signed)
Last ov 02/08/20 ?

## 2021-06-27 ENCOUNTER — Ambulatory Visit (HOSPITAL_COMMUNITY): Payer: Medicare HMO

## 2021-07-08 ENCOUNTER — Inpatient Hospital Stay (HOSPITAL_COMMUNITY): Payer: Medicare HMO | Attending: Physician Assistant

## 2021-07-08 VITALS — BP 129/68 | HR 71 | Temp 98.1°F | Resp 18 | Wt 243.2 lb

## 2021-07-08 DIAGNOSIS — D500 Iron deficiency anemia secondary to blood loss (chronic): Secondary | ICD-10-CM | POA: Diagnosis present

## 2021-07-08 DIAGNOSIS — Z79899 Other long term (current) drug therapy: Secondary | ICD-10-CM | POA: Diagnosis not present

## 2021-07-08 DIAGNOSIS — K922 Gastrointestinal hemorrhage, unspecified: Secondary | ICD-10-CM | POA: Diagnosis not present

## 2021-07-08 MED ORDER — SODIUM CHLORIDE 0.9 % IV SOLN
300.0000 mg | Freq: Once | INTRAVENOUS | Status: AC
  Administered 2021-07-08: 300 mg via INTRAVENOUS
  Filled 2021-07-08: qty 300

## 2021-07-08 MED ORDER — LORATADINE 10 MG PO TABS
10.0000 mg | ORAL_TABLET | Freq: Once | ORAL | Status: AC
  Administered 2021-07-08: 10 mg via ORAL
  Filled 2021-07-08: qty 1

## 2021-07-08 MED ORDER — ACETAMINOPHEN 325 MG PO TABS
650.0000 mg | ORAL_TABLET | Freq: Once | ORAL | Status: AC
  Administered 2021-07-08: 650 mg via ORAL
  Filled 2021-07-08: qty 2

## 2021-07-08 MED ORDER — SODIUM CHLORIDE 0.9 % IV SOLN: Freq: Once | INTRAVENOUS | Status: AC

## 2021-07-08 NOTE — Progress Notes (Signed)
Pt presents today for Venofer IV iron infusion per provider's order. Vital signs stable and pt voiced no new complaints at this time.  Peripheral IV started with good blood return pre and post infusion.  Venofer 300 mg  given today per MD orders. Tolerated infusion without adverse affects. Vital signs stable. No complaints at this time. Discharged from clinic ambulatory in stable condition. Alert and oriented x 3. F/U with [facility] as scheduled.   

## 2021-07-08 NOTE — Patient Instructions (Signed)
[facility]  Discharge Instructions: Thank you for choosing [facility] to provide your oncology and hematology care.  If you have a lab appointment with the Cancer Center, please come in thru the Main Entrance and check in at the main information desk.  Wear comfortable clothing and clothing appropriate for easy access to any Portacath or PICC line.   We strive to give you quality time with your provider. You may need to reschedule your appointment if you arrive late (15 or more minutes).  Arriving late affects you and other patients whose appointments are after yours.  Also, if you miss three or more appointments without notifying the office, you may be dismissed from the clinic at the provider's discretion.      For prescription refill requests, have your pharmacy contact our office and allow 72 hours for refills to be completed.    Today you received Venofer IV iron.   BELOW ARE SYMPTOMS THAT SHOULD BE REPORTED IMMEDIATELY: *FEVER GREATER THAN 100.4 F (38 C) OR HIGHER *CHILLS OR SWEATING *NAUSEA AND VOMITING THAT IS NOT CONTROLLED WITH YOUR NAUSEA MEDICATION *UNUSUAL SHORTNESS OF BREATH *UNUSUAL BRUISING OR BLEEDING *URINARY PROBLEMS (pain or burning when urinating, or frequent urination) *BOWEL PROBLEMS (unusual diarrhea, constipation, pain near the anus) TENDERNESS IN MOUTH AND THROAT WITH OR WITHOUT PRESENCE OF ULCERS (sore throat, sores in mouth, or a toothache) UNUSUAL RASH, SWELLING OR PAIN  UNUSUAL VAGINAL DISCHARGE OR ITCHING   Items with * indicate a potential emergency and should be followed up as soon as possible or go to the Emergency Department if any problems should occur.  Please show the CHEMOTHERAPY ALERT CARD or IMMUNOTHERAPY ALERT CARD at check-in to the Emergency Department and triage nurse.  Should you have questions after your visit or need to cancel or reschedule your appointment, please contact [facility] [phone]   and follow the prompts.  Office hours are 8:00 a.m. to 4:30 p.m. Monday - Friday. Please note that voicemails left after 4:00 p.m. may not be returned until the following business day.  We are closed weekends and major holidays. You have access to a nurse at all times for urgent questions. Please call the main number to the clinic [phone] and follow the prompts.  For any non-urgent questions, you may also contact your provider using MyChart. We now offer e-Visits for anyone 18 and older to request care online for non-urgent symptoms. For details visit [email or web address].   Also download the MyChart app! Go to the app store, search "MyChart", open the app, select [facility], and log in with your MyChart username and password.  Due to Covid, a mask is required upon entering the hospital/clinic. If you do not have a mask, one will be given to you upon arrival. For doctor visits, patients may have 1 support person aged 18 or older with them. For treatment visits, patients cannot have anyone with them due to current Covid guidelines and our immunocompromised population.  

## 2021-07-16 ENCOUNTER — Ambulatory Visit: Payer: Medicare HMO | Admitting: Gastroenterology

## 2021-07-30 ENCOUNTER — Encounter: Payer: Self-pay | Admitting: Gastroenterology

## 2021-07-30 ENCOUNTER — Ambulatory Visit: Payer: Medicare HMO | Admitting: Gastroenterology

## 2021-07-30 ENCOUNTER — Other Ambulatory Visit (INDEPENDENT_AMBULATORY_CARE_PROVIDER_SITE_OTHER): Payer: Medicare HMO

## 2021-07-30 VITALS — BP 110/66 | HR 90 | Ht 68.0 in | Wt 242.0 lb

## 2021-07-30 DIAGNOSIS — K7030 Alcoholic cirrhosis of liver without ascites: Secondary | ICD-10-CM | POA: Diagnosis not present

## 2021-07-30 LAB — COMPREHENSIVE METABOLIC PANEL
ALT: 25 U/L (ref 0–35)
AST: 26 U/L (ref 0–37)
Albumin: 4.8 g/dL (ref 3.5–5.2)
Alkaline Phosphatase: 108 U/L (ref 39–117)
BUN: 16 mg/dL (ref 6–23)
CO2: 23 mEq/L (ref 19–32)
Calcium: 10 mg/dL (ref 8.4–10.5)
Chloride: 103 mEq/L (ref 96–112)
Creatinine, Ser: 1.4 mg/dL — ABNORMAL HIGH (ref 0.40–1.20)
GFR: 39.76 mL/min — ABNORMAL LOW (ref >60.00)
Glucose, Bld: 100 mg/dL — ABNORMAL HIGH (ref 70–99)
Potassium: 3.7 mEq/L (ref 3.5–5.1)
Sodium: 138 mEq/L (ref 135–145)
Total Bilirubin: 0.6 mg/dL (ref 0.2–1.2)
Total Protein: 8.2 g/dL (ref 6.0–8.3)

## 2021-07-30 LAB — CBC
HCT: 42.1 % (ref 36.0–46.0)
Hemoglobin: 13.9 g/dL (ref 12.0–15.0)
MCHC: 33 g/dL (ref 30.0–36.0)
MCV: 87.9 fl (ref 78.0–100.0)
Platelets: 235 10*3/uL (ref 150.0–400.0)
RBC: 4.79 Mil/uL (ref 3.87–5.11)
RDW: 18.8 % — ABNORMAL HIGH (ref 11.5–15.5)
WBC: 5.8 10*3/uL (ref 4.0–10.5)

## 2021-07-30 LAB — PROTIME-INR
INR: 1.1 ratio — ABNORMAL HIGH (ref 0.8–1.0)
Prothrombin Time: 12.3 s (ref 9.6–13.1)

## 2021-07-30 NOTE — Patient Instructions (Signed)
If you are age 65 or younger, your body mass index should be between 19-25. Your Body mass index is 36.8 kg/m?Marland Kitchen If this is out of the aformentioned range listed, please consider follow up with your Primary Care Provider.  ?________________________________________________________ ? ?The [facility] providers would like to encourage you to use [facility] to communicate with providers for non-urgent requests or questions.  Due to long hold times on the telephone, sending your provider a message by [facility] may be a faster and more efficient way to get a response.  Please allow 48 business hours for a response.  Please remember that this is for non-urgent requests.  ?_______________________________________________________ ? ?Your provider has requested that you go to the basement level for lab work before leaving today. Press "B" on the elevator. The lab is located at the first door on the left as you exit the elevator. ? ?You have been scheduled for an abdominal ultrasound at [facility] (1st floor of hospital) on 08-05-21 at 8:00am. Please arrive 15 minutes prior to your appointment for registration. Make certain not to have anything to eat or drink 6 hours prior to your appointment. Should you need to reschedule your appointment, please contact radiology at [phone]. This test typically takes about 30 minutes to perform. ? ?You will need a follow up appointment in 6 months (November 2023).  We will contact you to get this scheduled.  ? ?You will need a recall endoscopy in June 2025.  We will contact you to get this scheduled. ? ?You will need a recall colonoscopy in June 2028.  We will contact you to get this scheduled. ? ?Due to recent changes in healthcare laws, you may see the results of your imaging and laboratory studies on MyChart before your provider has had a chance to review them.  We understand that in some cases there may be results that are confusing or concerning to you. Not all laboratory results  come back in the same time frame and the provider may be waiting for multiple results in order to interpret others.  Please give [place] 48 hours in order for your provider to thoroughly review all the results before contacting the office for clarification of your results.  ? ?Thank you for entrusting me with your care and choosing [facility]. ? ?Dr Ardis Hughs ? ?

## 2021-07-30 NOTE — Progress Notes (Signed)
? ?HPI: ?This is a very pleasant 66 year old woman who was referred to me by Coral Spikes, DO  to evaluate history of cirrhosis.   ? ? ?She wanted to switch care from [facility] gastroenterology here for her chronic cirrhosis care. ? ?Below is cut and pasted note from [facility] practitioner 01/2020 which was her last office visit there: ?----------------------------------------------------------------------------------------------------------------------------- ?"GERD, IBS constipation type, alcoholic cirrhosis without ascites.  Previously IBS not ideally controlled despite Linzess and stool softeners and recommended magnesium citrate tablets 1 on day 1 and if no bowel movement then 2 pills on day 2 and hold on day 3.  History of cirrhosis generally well compensated.   ?  ?Right upper quadrant ultrasound up-to-date 09/18/2019 with noted cirrhosis, no liver masses, otherwise normal.   ?  ?Most recent lab evaluations stable with meld score 10, child Pugh class A. ?  ?CT 03/16/2019 with normal spleen size no mention of gastric or esophageal varices. ?  ?EGD up-to-date 03/16/2019 completed for melena and acute posthemorrhagic anemia with normal esophagus noted and gastric AVMs as culprit for likely GI bleed status post bipolar cautery and MRI conditional clip placement.  Next due for screening in 2023." ?----------------------------------------------------------------------------------------------------------------------------------- ? ?Colonoscopy, 07/2016 Dr. Carlyon Prows fields indication "history of polyps" findings "redundant left colon" no polyps or cancers.  Dr. Eden Lathe recommended that she consider repeat colonoscopy at 5 or 10-year interval.  Colonoscopy 12/2012 Dr. Carlyon Prows fields found 2 subcentimeter polyps, 1 of those was adenomatous. ? ? ?Work-up for chronic liver disease: I reviewed labs in epic for the past 10 years and can find no serologic work-up for chronic liver disease ?Liver imaging: [place] 02/2020  cirrhosis without mass lesions ?MELD: She has not had INR in several years and so this cannot be reliably calculated. ?Varices screening: EGD 05/2020 Dr. Abbey Chatters: Done for dysphagia found a "nodule" at the GE junction which was normal proximal gastric mucosa, biopsies and to my review it just looks like a gastric fold by his EGD pictures.  No signs of portal hypertension next screening 05/2018 ? ? ?Blood work 04/2021 showed CBC was completely normal.,  Ferritin 10, TIBC 472. ? ? ?We had a very nice discussion about her chronic liver disease.  About 10 years ago it sounds like she was quite acutely ill from alcoholic hepatitis, cirrhosis.  She stopped drinking, her last alcoholic beverage was 07/1759 and she has recovered greatly since then. ? ?She has no troubles with encephalopathy, edema, overt GI bleeding. ? ?She wanted to establish care here because she does not like Dr. Abbey Chatters at [facility] ? ? ?Review of systems: ?Pertinent positive and negative review of systems were noted in the above HPI section. All other review negative. ? ? ?Past Medical History:  ?Diagnosis Date  ? Abdominal hernia   ? repaired  ? Abscess of pulp of tooth 12/12/2014  ? left side  ? Anemia   ? Anxiety   ? Arthritis   ? back  and down flank  right side more than left  ? B12 deficiency 06/12/2015  ? Bipolar disorder ([place])   ? Brain aneurysm   ? METAL COIL PREVENTS MRI IMAGING and CLIP  ? Cirrhosis ([place])   ? Presumably alcoholic cirrhosis  ? Complication of anesthesia   ? Depression   ? ETOH abuse   ? Quit in 07/2011   ? Fatigue   ? due to Sjogren's disease  ? Flat foot (pes planus) (acquired), left foot   ? Folate deficiency 06/30/2016  ?  GERD (gastroesophageal reflux disease)   ? HTN (hypertension)   ? no longer medicated, dated on 04/25/12  ? IBS (irritable bowel syndrome)   ? Iron deficiency anemia due to chronic blood loss   ? Myofascial pain   ? Panic attack   ? PONV (postoperative nausea and vomiting)   ? Psoriatic arthritis ([place])   ? Right  inguinal hernia 08/16/2012  ? Seizures ([place]) 1997  ? 1 seizure from coil insertion but no more seizures and no meds for seizures  ? Sjogren's disease ([place])   ? Stroke [facility]) 2019  ? [place] HEAD 02/03/2007  ? Qualifier: Diagnosis of  By: Aline Brochure MD, Dorothyann Peng    ? TIA (transient ischemic attack)   ? 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([place])   ? Vertigo   ? ? ?Past Surgical History:  ?Procedure Laterality Date  ? AGILE CAPSULE N/A 06/21/2014  ? Procedure: AGILE CAPSULE;  Surgeon: Danie Binder, MD;  [facility];  Service: Endoscopy;  Laterality: N/A;  0700  ? BIOPSY  10/13/2011  ? SLF: mild gastritis/duodenal polypoid lesion   ? BIOPSY  06/04/2020  ? Procedure: BIOPSY;  Surgeon: Eloise Harman, DO;  [facility];  Service: Endoscopy;;  ? [place]  ? CHOLECYSTECTOMY N/A 10/28/2018  ? Procedure: LAPAROSCOPIC CHOLECYSTECTOMY WITH INTRAOPERATIVE CHOLANGIOGRAM;  Surgeon: Virl Cagey, MD;  Location: [facility];  Service: General;  Laterality: N/A;  ? COLONOSCOPY  10/13/2011  ? WNU:UVOZDGUY hemorrhoids/varices rectal/lesion in ascending colon(HYPERPLASTIC POLYP)  ? COLONOSCOPY N/A 01/17/2013  ? SLF: 1. Internal hemorrhoids 2. Ascending colon polyps 3.  Rectal varices.   ? COLONOSCOPY N/A 08/10/2016  ? Dr. Oneida Alar: redundant left colon. external and internal hemorhroids. 5-10 year surveillance  ? ESOPHAGOGASTRODUODENOSCOPY N/A 06/29/2014  ? SLF: 1. anemia due to polypoid lesions in teh antrum, and duodenal polyps 2. single duodenum AVM  ? ESOPHAGOGASTRODUODENOSCOPY (EGD) WITH PROPOFOL N/A 03/07/2019  ? Procedure: ESOPHAGOGASTRODUODENOSCOPY (EGD) WITH PROPOFOL;  Surgeon: Danie Binder, MD;  [facility];  Service: Endoscopy;  Laterality: N/A;  1:00pm  ? ESOPHAGOGASTRODUODENOSCOPY (EGD) WITH PROPOFOL N/A 03/16/2019  ? Procedure: ESOPHAGOGASTRODUODENOSCOPY (EGD) WITH PROPOFOL;  Surgeon: Danie Binder, MD;  [facility];  Service: Endoscopy;   Laterality: N/A;  ? ESOPHAGOGASTRODUODENOSCOPY (EGD) WITH PROPOFOL N/A 06/04/2020  ? Procedure: ESOPHAGOGASTRODUODENOSCOPY (EGD) WITH PROPOFOL;  Surgeon: Eloise Harman, DO;  [facility];  Service: Endoscopy;  Laterality: N/A;  am  ? FLEXIBLE SIGMOIDOSCOPY N/A 01/21/2016  ? Procedure: FLEXIBLE SIGMOIDOSCOPY;  Surgeon: Danie Binder, MD;  [facility];  Service: Endoscopy;  Laterality: N/A;  245  ? GIVENS CAPSULE STUDY N/A 06/29/2014  ? Procedure: GIVENS CAPSULE STUDY;  Surgeon: Danie Binder, MD;  [facility];  Service: Endoscopy;  Laterality: N/A;  ? GIVENS CAPSULE STUDY N/A 07/13/2014  ? Procedure: GIVENS CAPSULE STUDY;  Surgeon: Danie Binder, MD;  [facility];  Service: Endoscopy;  Laterality: N/A;  700  ? HEMORRHOID BANDING N/A 01/21/2016  ? Procedure: HEMORRHOID BANDING;  Surgeon: Danie Binder, MD;  [facility];  Service: Endoscopy;  Laterality: N/A;  ? HEMOSTASIS CLIP PLACEMENT  03/16/2019  ? Procedure: HEMOSTASIS CLIP PLACEMENT;  Surgeon: Danie Binder, MD;  [facility];  Service: Endoscopy;;  x 4  ? HEMOSTASIS CONTROL  03/16/2019  ? Procedure: HEMOSTASIS CONTROL;  Surgeon: Danie Binder, MD;  [facility];  Service: Endoscopy;;  injection with epinephine 0.'1mg'$ /ml via scope and bipolar cautery with gold probe  ? INGUINAL HERNIA REPAIR Right 08/28/2013  ? Procedure: RIGHT NGUINAL HERNIORRHAPHY WITH MESH;  Surgeon: Jamesetta So, MD;  Location: [facility];  Service: General;  Laterality: Right;  ? INSERTION OF MESH N/A 06/09/2013  ? Procedure: INSERTION OF MESH;  Surgeon: Jamesetta So, MD;  Location: [facility];  Service: General;  Laterality: N/A;  ? INSERTION OF MESH Right 08/28/2013  ? Procedure: INSERTION OF MESH;  Surgeon: Jamesetta So, MD;  Location: [facility];  Service: General;  Laterality: Right;  ? PARACENTESIS  Feb 2014  ? 10.7 liters  ? TOOTH EXTRACTION Left 12/12/14  ? UMBILICAL HERNIA REPAIR N/A 06/09/2013  ? Procedure:  UMBILICAL HERNIORRHAPHY WITH MESH;  Surgeon: Jamesetta So, MD;  Location: [facility];  Service: General;  Laterality: N/A;  ? ? ?Current Outpatient Medications  ?Medication Instructions  ? aspirin EC 81 mg, [place]

## 2021-08-05 ENCOUNTER — Ambulatory Visit (HOSPITAL_COMMUNITY)
Admission: RE | Admit: 2021-08-05 | Discharge: 2021-08-05 | Disposition: A | Discharge status: Home / Self Care | Payer: Medicare HMO | Source: Ambulatory Visit | Attending: Gastroenterology | Admitting: Gastroenterology

## 2021-08-05 DIAGNOSIS — K7030 Alcoholic cirrhosis of liver without ascites: Secondary | ICD-10-CM | POA: Insufficient documentation

## 2021-08-05 LAB — ANA: Anti Nuclear Antibody (ANA): POSITIVE — AB

## 2021-08-05 LAB — ANTI-SMOOTH MUSCLE ANTIBODY, IGG: Actin (Smooth Muscle) Antibody (IGG): <20 U (ref <20)

## 2021-08-05 LAB — HEPATITIS B SURFACE ANTIGEN: Hepatitis B Surface Ag: NONREACTIVE

## 2021-08-05 LAB — AFP TUMOR MARKER: AFP-Tumor Marker: 6 ng/mL

## 2021-08-05 LAB — HEPATITIS C ANTIBODY
Hepatitis C Ab: NONREACTIVE
SIGNAL TO CUT-OFF: 0.1 (ref <1.00)

## 2021-08-05 LAB — MITOCHONDRIAL ANTIBODIES: Mitochondrial M2 Ab, IgG: <=20 U (ref <=20.0)

## 2021-08-05 LAB — HEPATITIS A ANTIBODY, TOTAL: Hepatitis A AB,Total: NONREACTIVE

## 2021-08-05 LAB — ANTI-NUCLEAR AB-TITER (ANA TITER): ANA Titer 1: 1:40 {titer} — ABNORMAL HIGH

## 2021-08-05 LAB — HEPATITIS B SURFACE ANTIBODY,QUALITATIVE: Hep B S Ab: NONREACTIVE

## 2021-08-28 ENCOUNTER — Other Ambulatory Visit (INDEPENDENT_AMBULATORY_CARE_PROVIDER_SITE_OTHER): Payer: Medicare HMO | Admitting: *Deleted

## 2021-08-28 ENCOUNTER — Other Ambulatory Visit: Payer: Self-pay | Admitting: Adult Health

## 2021-08-28 DIAGNOSIS — R300 Dysuria: Secondary | ICD-10-CM

## 2021-08-28 LAB — POCT URINALYSIS DIPSTICK OB
Glucose, UA: NEGATIVE
Nitrite, UA: NEGATIVE
POC,PROTEIN,UA: NEGATIVE

## 2021-08-28 MED ORDER — SULFAMETHOXAZOLE-TRIMETHOPRIM 800-160 MG PO TABS: 1.0000 | ORAL_TABLET | Freq: Two times a day (BID) | ORAL | 0 refills | Status: DC

## 2021-08-28 MED ORDER — PHENAZOPYRIDINE HCL 200 MG PO TABS: 200.0000 mg | ORAL_TABLET | Freq: Three times a day (TID) | ORAL | 0 refills | Status: DC | PRN

## 2021-08-28 NOTE — Progress Notes (Signed)
Will rx septra ds and pyridium, for UTI symptoms, urine sent for culture

## 2021-08-28 NOTE — Progress Notes (Signed)
   NURSE VISIT- UTI SYMPTOMS   SUBJECTIVE:  Deanna Thompson is a 66 y.o. G0P0000 female here for UTI symptoms. She is a GYN patient. She reports dysuria.  OBJECTIVE:  There were no vitals taken for this visit.  Appears well, in no apparent distress  Results for orders placed or performed in visit on 08/28/21 (from the past 24 hour(s))  POC Urinalysis Dipstick OB   Collection Time: 08/28/21  3:09 PM  Result Value Ref Range   Color, UA     Clarity, UA     Glucose, UA Negative Negative   Bilirubin, UA     Ketones, UA small    Spec Grav, UA     Blood, UA mod    pH, UA     POC,PROTEIN,UA Negative Negative, Trace, Small (1+), Moderate (2+), Large (3+), 4+   Urobilinogen, UA     Nitrite, UA neg    Leukocytes, UA Small (1+) (A) Negative   Appearance     Odor     *Note: Due to a large number of results and/or encounters for the requested time period, some results have not been displayed. A complete set of results can be found in Results Review.    ASSESSMENT: GYN patient with UTI symptoms and negative nitrites  PLAN: Discussed with Derrek Monaco, AGNP   Rx sent by provider today: Yes Urine culture sent Call or return to clinic prn if these symptoms worsen or fail to improve as anticipated. Follow-up: as needed   Janece Canterbury  08/28/2021 3:12 PM

## 2021-08-29 LAB — URINALYSIS
Bilirubin, UA: NEGATIVE
Glucose, UA: NEGATIVE
Nitrite, UA: NEGATIVE
Specific Gravity, UA: 1.025 (ref 1.005–1.030)
Urobilinogen, Ur: 1 mg/dL (ref 0.2–1.0)
pH, UA: 5 (ref 5.0–7.5)

## 2021-09-01 LAB — URINE CULTURE

## 2021-09-05 ENCOUNTER — Inpatient Hospital Stay (HOSPITAL_COMMUNITY): Payer: Medicare HMO | Attending: Physician Assistant

## 2021-09-05 DIAGNOSIS — E785 Hyperlipidemia, unspecified: Secondary | ICD-10-CM | POA: Diagnosis not present

## 2021-09-05 DIAGNOSIS — Z79899 Other long term (current) drug therapy: Secondary | ICD-10-CM | POA: Insufficient documentation

## 2021-09-05 DIAGNOSIS — K6289 Other specified diseases of anus and rectum: Secondary | ICD-10-CM | POA: Diagnosis not present

## 2021-09-05 DIAGNOSIS — N1831 Chronic kidney disease, stage 3a: Secondary | ICD-10-CM | POA: Insufficient documentation

## 2021-09-05 DIAGNOSIS — D500 Iron deficiency anemia secondary to blood loss (chronic): Secondary | ICD-10-CM | POA: Diagnosis present

## 2021-09-05 DIAGNOSIS — K31811 Angiodysplasia of stomach and duodenum with bleeding: Secondary | ICD-10-CM | POA: Diagnosis not present

## 2021-09-05 DIAGNOSIS — I129 Hypertensive chronic kidney disease with stage 1 through stage 4 chronic kidney disease, or unspecified chronic kidney disease: Secondary | ICD-10-CM | POA: Insufficient documentation

## 2021-09-05 DIAGNOSIS — Z8679 Personal history of other diseases of the circulatory system: Secondary | ICD-10-CM | POA: Insufficient documentation

## 2021-09-05 DIAGNOSIS — R5383 Other fatigue: Secondary | ICD-10-CM | POA: Insufficient documentation

## 2021-09-05 DIAGNOSIS — Z8673 Personal history of transient ischemic attack (TIA), and cerebral infarction without residual deficits: Secondary | ICD-10-CM | POA: Diagnosis not present

## 2021-09-05 LAB — CBC WITH DIFFERENTIAL/PLATELET
Abs Immature Granulocytes: 0.02 10*3/uL (ref 0.00–0.07)
Basophils Absolute: 0 10*3/uL (ref 0.0–0.1)
Basophils Relative: 1 %
Eosinophils Absolute: 0.1 10*3/uL (ref 0.0–0.5)
Eosinophils Relative: 2 %
HCT: 41 % (ref 36.0–46.0)
Hemoglobin: 13.2 g/dL (ref 12.0–15.0)
Immature Granulocytes: 0 %
Lymphocytes Relative: 14 %
Lymphs Abs: 0.9 10*3/uL (ref 0.7–4.0)
MCH: 29.4 pg (ref 26.0–34.0)
MCHC: 32.2 g/dL (ref 30.0–36.0)
MCV: 91.3 fL (ref 80.0–100.0)
Monocytes Absolute: 0.6 10*3/uL (ref 0.1–1.0)
Monocytes Relative: 9 %
Neutro Abs: 4.9 10*3/uL (ref 1.7–7.7)
Neutrophils Relative %: 74 %
Platelets: 258 10*3/uL (ref 150–400)
RBC: 4.49 MIL/uL (ref 3.87–5.11)
RDW: 17 % — ABNORMAL HIGH (ref 11.5–15.5)
WBC: 6.5 10*3/uL (ref 4.0–10.5)
nRBC: 0 % (ref 0.0–0.2)

## 2021-09-05 LAB — IRON AND TIBC
Iron: 113 ug/dL (ref 28–170)
Saturation Ratios: 27 % (ref 10.4–31.8)
TIBC: 418 ug/dL (ref 250–450)
UIBC: 305 ug/dL

## 2021-09-05 LAB — FERRITIN: Ferritin: 172 ng/mL (ref 11–307)

## 2021-09-11 ENCOUNTER — Other Ambulatory Visit: Payer: Self-pay | Admitting: Family Medicine

## 2021-09-11 DIAGNOSIS — I10 Essential (primary) hypertension: Secondary | ICD-10-CM

## 2021-09-12 ENCOUNTER — Inpatient Hospital Stay (HOSPITAL_COMMUNITY): Payer: Medicare HMO | Admitting: Physician Assistant

## 2021-09-12 ENCOUNTER — Encounter (HOSPITAL_COMMUNITY): Payer: Self-pay

## 2021-09-13 ENCOUNTER — Other Ambulatory Visit: Payer: Self-pay | Admitting: Family Medicine

## 2021-09-13 DIAGNOSIS — Z8673 Personal history of transient ischemic attack (TIA), and cerebral infarction without residual deficits: Secondary | ICD-10-CM

## 2021-09-14 NOTE — Progress Notes (Unsigned)
Virtual Visit via Telephone Note [facility]  I connected with Deanna Thompson  on 09/15/2021 at 1:50 PM by telephone and verified that I am speaking with the correct person using two identifiers.  Location: Patient: Home Provider: [facility]   I discussed the limitations, risks, security and privacy concerns of performing an evaluation and management service by telephone and the availability of in person appointments. I also discussed with the patient that there may be a patient responsible charge related to this service. The patient expressed understanding and agreed to proceed.  REASON FOR VISIT:  Follow-up for iron deficiency anemia   CURRENT THERAPY: Intermittent IV iron infusions   INTERVAL HISTORY:  Deanna Thompson 66 y.o. female returns for routine follow-up of her iron deficiency anemia.  She was last evaluated by Tarri Abernethy PA-C via telemedicine visit on 05/29/2021.   She received IV Venofer 300 mg x 3 doses from 06/06/2021 through 07/08/2021.  She reports that she had some improved energy after IV iron, but does still have some mild persistent fatigue with energy about 70%.  She denies any pica, restless legs, headaches, chest pain, dyspnea on exertion, lightheadedness, or syncope.  She has a history of bleeding AVMs, but has not had any recent hematemesis, hematochezia, or melena.  She has 70% energy and 100% appetite. She endorses that she is maintaining a stable weight.    OBSERVATIONS/OBJECTIVE: Review of Systems  Constitutional:  Positive for malaise/fatigue. Negative for chills, diaphoresis, fever and weight loss.  Respiratory:  Negative for cough and shortness of breath.   Cardiovascular:  Negative for chest pain and palpitations.  Gastrointestinal:  Positive for diarrhea (IBS) and nausea. Negative for abdominal pain, blood in stool, melena and vomiting.  Genitourinary:        Recent UTI  Neurological:  Positive for dizziness  (occasional). Negative for headaches.  Psychiatric/Behavioral:  Positive for depression. The patient is nervous/anxious.      PHYSICAL EXAM (per limitations of virtual telephone visit): The patient is alert and oriented x 3, exhibiting adequate mentation, good mood, and ability to speak in full sentences and execute sound judgement.   ASSESSMENT & PLAN: 1.  Iron deficiency anemia due to chronic blood loss - Due to chronic GI blood loss. 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  Also showed polypoid lesions in the duodenum, single duodenum AVM. - Her colonoscopy on 01/17/2013 showed rectal varices, polyps and hemorrhoids. - She had a flex sigmoidoscopy with Dr. Oneida Alar on 01/21/2016 showing rectal bleeding due to proctitis and possibly internal hemorrhoids. - Patient was hospitalized in December 2020 for an AVM bleed where her hemoglobin dropped to 7.7. 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  Injected.  Treated with bipolar cautery.  Clips were placed. - She has been transfused RBCs in the past - She has been referred to gastroenterology, and previously followed with Dr. Oneida Alar - She last received IV Venofer x3 on 06/06/2021 through 07/08/2021 - No bright red blood per rectum or melena   - Symptomatic with persistent mild fatigue - Most recent labs (09/05/2021): Hgb 13.2/MCV 91.3, ferritin 172, iron saturation 27 % with TIBC 418 - PLAN: No indication for IV iron supplementation at this time. --  Repeat anemia panel and RTC (PHONE visit) in 3 months.  (We will not premedicate - although patient has  multiple medication allergies listed, she has previously tolerated Venofer well without premedication.) - We will plan on annual in person visit, next due around October/November 2023   2.  Other history - Her other pertinent medical history includes CKD stage  IIIa,  liver cirrhosis, history of cerebral aneurysm with stroke, hyperlipidemia, and hypertension   FOLLOW UP INSTRUCTIONS: Labs in 3 months (CBC, iron/TIBC, ferritin) Phone visit after labs    I discussed the assessment and treatment plan with the patient. The patient was provided an opportunity to ask questions and all were answered. The patient agreed with the plan and demonstrated an understanding of the instructions.   The patient was advised to call back or seek an in-person evaluation if the symptoms worsen or if the condition fails to improve as anticipated.  I provided 14 minutes of non-face-to-face time during this encounter.   Harriett Rush, PA-C 09/15/2021 2:09 PM

## 2021-09-15 ENCOUNTER — Inpatient Hospital Stay (HOSPITAL_BASED_OUTPATIENT_CLINIC_OR_DEPARTMENT_OTHER): Payer: Medicare HMO | Admitting: Physician Assistant

## 2021-09-15 DIAGNOSIS — D500 Iron deficiency anemia secondary to blood loss (chronic): Secondary | ICD-10-CM | POA: Diagnosis not present

## 2021-09-27 ENCOUNTER — Other Ambulatory Visit: Payer: Self-pay | Admitting: Gastroenterology

## 2021-10-06 ENCOUNTER — Other Ambulatory Visit: Payer: Self-pay | Admitting: Gastroenterology

## 2021-10-06 ENCOUNTER — Other Ambulatory Visit: Payer: Self-pay | Admitting: Family Medicine

## 2021-10-06 NOTE — Telephone Encounter (Signed)
Nurses 1.  Please verify with patient that she is still taking this medication twice per day 2.  If we are to refill this then she will need to do a follow-up office visit with Dr. Lacinda Axon within the next 2 to 3 weeks 3.  Previously this prescription was filled by gastroenterology. I am fine with doing a 30-day supply with a follow-up with Dr. Lacinda Axon within the next 3 to 4 weeks Her other option is to get it through gastroenterology

## 2021-10-07 ENCOUNTER — Ambulatory Visit
Admission: EM | Admit: 2021-10-07 | Discharge: 2021-10-07 | Disposition: A | Discharge status: Home / Self Care | Payer: Medicare HMO

## 2021-10-07 DIAGNOSIS — J069 Acute upper respiratory infection, unspecified: Secondary | ICD-10-CM

## 2021-10-07 MED ORDER — BENZONATATE 100 MG PO CAPS: 100.0000 mg | ORAL_CAPSULE | Freq: Three times a day (TID) | ORAL | 0 refills | Status: DC | PRN

## 2021-10-07 NOTE — Telephone Encounter (Signed)
Left message for patient to return the call for additional details and recommendations.   

## 2021-10-07 NOTE — Discharge Instructions (Addendum)
Your symptoms and exam findings are most consistent with a viral upper respiratory infection. These usually run their course in about 10 days.  If your symptoms last longer than 10 days without improvement, please follow up with your primary care provider.  If your symptoms, worsen, please go to the Emergency Room.    Some things that can make you feel better are: - Increased rest - Increasing fluid with water/sugar free electrolytes - Acetaminophen as needed for fever/pain.  - Salt water gargling, chloraseptic spray and throat lozenges - OTC guaifenesin (Mucinex).  - Saline sinus flushes or a neti pot.  - Humidifying the air. -Cough suppressant-Tessalon Perles every 8 hours as needed

## 2021-10-07 NOTE — ED Provider Notes (Signed)
[facility]    CSN: [id number] Arrival date & time: 10/07/21  1245      History   Chief Complaint Chief Complaint  Patient presents with   Nasal Congestion         HPI [place] is a 66 y.o. female.   Patient presents with 3 days of cough, nasal congestion and headache that started last night.  She does have some postnasal drainage, sore throat. She denies fevers, body aches, chills, shortness of breath/wheezing, chest pain or tightness, chest congestion.  Also denies ear pain or pressure, ear drainage, abdominal pain, nausea/vomiting, diarrhea, decreased appetite, new rash, and fatigue.  She has taken over-the-counter medications which have helped minimally.  Patient denies antibiotic use in the past month; also denies any history of chronic lung disease or smoking history.    Past Medical History:  Diagnosis Date   Abdominal hernia    repaired   Abscess of pulp of tooth 12/12/2014   left side   Anemia    Anxiety    Arthritis    back  and down flank  right side more than left   B12 deficiency 06/12/2015   Bipolar disorder ([place])    Brain aneurysm    METAL COIL PREVENTS MRI IMAGING and CLIP   Cirrhosis ([place])    Presumably alcoholic cirrhosis   Complication of anesthesia    Depression    ETOH abuse    Quit in 07/2011    Fatigue    due to Sjogren's disease   Flat foot (pes planus) (acquired), left foot    Folate deficiency 06/30/2016   GERD (gastroesophageal reflux disease)    HTN (hypertension)    no longer medicated, dated on 04/25/12   IBS (irritable bowel syndrome)    Iron deficiency anemia due to chronic blood loss    Myofascial pain    Panic attack    PONV (postoperative nausea and vomiting)    Psoriatic arthritis ([place])    Right inguinal hernia 08/16/2012   Seizures ([place]) 1997   1 seizure from coil insertion but no more seizures and no meds for seizures   Sjogren's disease ([place])    Stroke ([place]) 2019   [place]  02/03/2007   Qualifier: Diagnosis of  By: Aline Brochure MD, Dorothyann Peng     TIA (transient ischemic attack)    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([place])    Vertigo     Patient Active Problem List   Diagnosis Date Noted   Hematuria 06/03/2021   Burning with urination 06/03/2021   Symptoms of urinary tract infection 06/03/2021   Acute cough 05/28/2021   Bipolar disorder ([place]) 01/13/2021   Hyperlipidemia 01/13/2021   Decreased cardiac ejection fraction 01/13/2021   IBS (irritable bowel syndrome)    History of stroke 08/01/2017   Essential hypertension 07/31/2017   GERD (gastroesophageal reflux disease) 07/31/2017   Brain aneurysm 07/31/2017   CKD (chronic kidney disease), stage III ([place]) 07/31/2017   LBBB (left bundle branch block) 07/31/2017   Psoriatic arthritis ([place]) 04/15/2015   Cirrhosis of liver ([place]) 09/19/2014   Iron deficiency anemia due to chronic blood loss    Insomnia 03/17/2014    Past Surgical History:  Procedure Laterality Date   AGILE CAPSULE N/A 06/21/2014   Procedure: AGILE CAPSULE;  Surgeon: Danie Binder, MD;  [facility];  Service: Endoscopy;  Laterality: N/A;  0700   BIOPSY  10/13/2011   SLF: mild gastritis/duodenal polypoid lesion    BIOPSY  06/04/2020  Procedure: BIOPSY;  Surgeon: Eloise Harman, DO;  [facility];  Service: Endoscopy;;   CEREBRAL ANEURYSM REPAIR  1997   CHOLECYSTECTOMY N/A 10/28/2018   Procedure: LAPAROSCOPIC CHOLECYSTECTOMY WITH INTRAOPERATIVE CHOLANGIOGRAM;  Surgeon: Virl Cagey, MD;  Location: [facility];  Service: General;  Laterality: N/A;   COLONOSCOPY  10/13/2011   DVV:OHYWVPXT hemorrhoids/varices rectal/lesion in ascending colon(HYPERPLASTIC POLYP)   COLONOSCOPY N/A 01/17/2013   SLF: 1. Internal hemorrhoids 2. Ascending colon polyps 3.  Rectal varices.    COLONOSCOPY N/A 08/10/2016   Dr. Oneida Alar: redundant left colon. external and internal hemorhroids. 5-10 year surveillance   ESOPHAGOGASTRODUODENOSCOPY  N/A 06/29/2014   SLF: 1. anemia due to polypoid lesions in teh antrum, and duodenal polyps 2. single duodenum AVM   ESOPHAGOGASTRODUODENOSCOPY (EGD) WITH PROPOFOL N/A 03/07/2019   Procedure: ESOPHAGOGASTRODUODENOSCOPY (EGD) WITH PROPOFOL;  Surgeon: Danie Binder, MD;  [facility];  Service: Endoscopy;  Laterality: N/A;  1:00pm   ESOPHAGOGASTRODUODENOSCOPY (EGD) WITH PROPOFOL N/A 03/16/2019   Procedure: ESOPHAGOGASTRODUODENOSCOPY (EGD) WITH PROPOFOL;  Surgeon: Danie Binder, MD;  [facility];  Service: Endoscopy;  Laterality: N/A;   ESOPHAGOGASTRODUODENOSCOPY (EGD) WITH PROPOFOL N/A 06/04/2020   Procedure: ESOPHAGOGASTRODUODENOSCOPY (EGD) WITH PROPOFOL;  Surgeon: Eloise Harman, DO;  [facility];  Service: Endoscopy;  Laterality: N/A;  am   FLEXIBLE SIGMOIDOSCOPY N/A 01/21/2016   Procedure: FLEXIBLE SIGMOIDOSCOPY;  Surgeon: Danie Binder, MD;  [facility];  Service: Endoscopy;  Laterality: N/A;  [place] N/A 06/29/2014   Procedure: GIVENS CAPSULE STUDY;  Surgeon: Danie Binder, MD;  [facility];  Service: Endoscopy;  Laterality: N/A;   GIVENS CAPSULE STUDY N/A 07/13/2014   Procedure: GIVENS CAPSULE STUDY;  Surgeon: Danie Binder, MD;  [facility];  Service: Endoscopy;  Laterality: N/A;  [place] N/A 01/21/2016   Procedure: HEMORRHOID BANDING;  Surgeon: Danie Binder, MD;  [facility];  Service: Endoscopy;  Laterality: N/A;   HEMOSTASIS CLIP PLACEMENT  03/16/2019   Procedure: HEMOSTASIS CLIP PLACEMENT;  Surgeon: Danie Binder, MD;  [facility];  Service: Endoscopy;;  x 4   HEMOSTASIS CONTROL  03/16/2019   Procedure: HEMOSTASIS CONTROL;  Surgeon: Danie Binder, MD;  [facility];  Service: Endoscopy;;  injection with epinephine 0.'1mg'$ /ml via scope and bipolar cautery with gold probe   INGUINAL HERNIA REPAIR Right 08/28/2013   Procedure: RIGHT [place];  Surgeon: Jamesetta So, MD;  Location: [facility];  Service: General;  Laterality: Right;   INSERTION OF MESH N/A 06/09/2013   Procedure: INSERTION OF MESH;  Surgeon: Jamesetta So, MD;  Location[facility];  Service: General;  Laterality: N/A;   INSERTION OF MESH Right 08/28/2013   Procedure: INSERTION OF MESH;  Surgeon: Jamesetta So, MD;  Location: [facility];  Service: General;  Laterality: Right;   PARACENTESIS  Feb 2014   10.7 liters   TOOTH EXTRACTION Left 0/62/69   UMBILICAL HERNIA REPAIR N/A 06/09/2013   Procedure: UMBILICAL HERNIORRHAPHY WITH MESH;  Surgeon: Jamesetta So, MD;  Location: [facility];  Service: General;  Laterality: N/A;    OB History     Gravida  0   Para  0   Term  0   Preterm  0   AB  0   Living  0      SAB  0  IAB  0   Ectopic  0   Multiple  0   Live Births  0            Home Medications    Prior to Admission medications   Medication Sig Start Date End Date Taking? Authorizing Provider  acetaminophen (TYLENOL) 500 MG tablet Take 500 mg by mouth every 6 (six) hours as needed.   Yes [provider]  benzonatate (TESSALON) 100 MG capsule Take 1 capsule (100 mg total) by mouth 3 (three) times daily as needed for cough. Do not take with alcohol or while driving or operating heavy machinery 10/07/21  Yes Eulogio Bear, NP  aspirin EC 81 MG tablet Take 81 mg by mouth daily. Swallow whole.    [provider]  atorvastatin (LIPITOR) 40 MG tablet TAKE ONE TABLET BY MOUTH ONCE DAILY. 09/15/21   Cook, Barnie Del, DO  bumetanide (BUMEX) 1 MG tablet TAKE (1) TABLET BY MOUTH TWICE DAILY. 06/24/21   Annitta Needs, NP  buPROPion (WELLBUTRIN XL) 300 MG 24 hr tablet Take 300 mg by mouth daily. 04/28/21   [provider]  Camphor-Menthol-Methyl Sal (SALONPAS) 3.04-04-08 % PTCH Place 1 patch onto the skin daily as needed (pain.).    [provider]  cholecalciferol (VITAMIN D3) 25 MCG (1000 UT) tablet Take 2  tablets (2,000 Units total) by mouth daily. Patient taking differently: Take 1,000 Units by mouth every morning. 12/14/18   Fields, Marga Melnick, MD  DULoxetine (CYMBALTA) 60 MG capsule 1 capsule    [provider]  famotidine (PEPCID) 20 MG tablet Take 1 tablet (20 mg total) by mouth 2 (two) times daily before a meal. 10/17/19   Jodi Mourning, Tivis Ringer, PA-C  fluticasone (FLONASE) 50 MCG/ACT nasal spray Place 1 spray into both nostrils See admin instructions. Instill 1 spray into each nostril every morning, may also use later in the day as needed for sinus congestion    [provider]  ketorolac (ACULAR) 0.4 % SOLN Place 1 drop into the right eye 4 (four) times daily. 09/08/21   [provider]  lamoTRIgine (LAMICTAL) 200 MG tablet Take 200 mg by mouth daily. 02/06/21   [provider]  LINZESS 145 MCG CAPS capsule TAKE (1) CAPSULE BY MOUTH EVERY DAY BEFORE BREAKFAST. 09/29/21   Annitta Needs, NP  loratadine (CLARITIN) 10 MG tablet Take 10 mg by mouth daily as needed.    [provider]  losartan (COZAAR) 25 MG tablet TAKE (1) TABLET BY MOUTH ONCE DAILY. 05/13/21   Coral Spikes, DO  phenazopyridine (PYRIDIUM) 200 MG tablet Take 1 tablet (200 mg total) by mouth 3 (three) times daily as needed for pain. 08/28/21   Estill Dooms, NP  Polyvinyl Alcohol-Povidone (REFRESH OP) Place 1 drop into both eyes 3 (three) times daily as needed (dry eyes).    [provider]  prednisoLONE acetate (PRED FORTE) 1 % ophthalmic suspension Place into the left eye. 09/08/21   [provider]  Simethicone (GAS-X PO) Take 1 tablet by mouth 2 (two) times daily as needed (bloating/flatulence).    [provider]  sodium chloride (OCEAN) 0.65 % nasal spray Place 1 spray into the nose 2 (two) times daily as needed for congestion.    [provider]  spironolactone (ALDACTONE) 100 MG tablet TAKE 1/2 TABLET BY MOUTH TWICE DAILY. 09/11/21   Coral Spikes, DO   Tofacitinib Citrate ER (XELJANZ XR) 11 MG TB24 Take 11 mg by mouth  daily.     [provider]  XIIDRA 5 % SOLN Apply 1 drop to eye 2 (two) times daily. 09/08/21   [provider]    Family History Family History  Problem Relation Age of Onset   Cirrhosis Mother        nash   COPD Father    Heart disease Father    Asthma Other    Heart disease Other    Colon cancer Neg Hx    Inflammatory bowel disease Neg Hx    Stomach cancer Neg Hx    Esophageal cancer Neg Hx     Social History Social History   Tobacco Use   Smoking status: Former    Packs/day: 2.00    Years: 12.00    Total pack years: 24.00    Types: Cigarettes    Quit date: 03/31/1995    Years since quitting: 26.5   Smokeless tobacco: Never   Tobacco comments:    quit in 1997  Vaping Use   Vaping Use: Never used  Substance Use Topics   Alcohol use: No    Comment: Bottle of wine a day for years, but quit in 07/2011   Drug use: No     Allergies   Omeprazole, Orencia [abatacept], Other, Phenytoin, Keflex [cephalexin], Lidocaine viscous hcl, Meat extract, and Promethazine hcl   Review of Systems Review of Systems Per HPI  Physical Exam Triage Vital Signs ED Triage Vitals [10/07/21 1300]  Enc Vitals Group     BP 119/75     Pulse Rate 85     Resp 18     Temp 98 F (36.7 C)     Temp Source Oral     SpO2 96 %     Weight      Height      Head Circumference      Peak Flow      Pain Score 3     Pain Loc      Pain Edu?      Excl. in [place]?    No data found.  Updated Vital Signs BP 119/75 (BP Location: Right Arm)   Pulse 85   Temp 98 F (36.7 C) (Oral)   Resp 18   SpO2 96%   Visual Acuity Right Eye Distance:   Left Eye Distance:   Bilateral Distance:    Right Eye Near:   Left Eye Near:    Bilateral Near:     Physical Exam Vitals and nursing note reviewed.  Constitutional:      General: She is not in acute distress.    Appearance: Normal appearance. She is not ill-appearing  or toxic-appearing.  HENT:     Head: Normocephalic and atraumatic.     Right Ear: Tympanic membrane, ear canal and external ear normal.     Left Ear: Tympanic membrane, ear canal and external ear normal.     Nose: Rhinorrhea present. No congestion.     Right Sinus: No maxillary sinus tenderness or frontal sinus tenderness.     Left Sinus: No maxillary sinus tenderness or frontal sinus tenderness.     Mouth/Throat:     Mouth: Mucous membranes are moist.     Pharynx: Oropharynx is clear. Posterior oropharyngeal erythema present. No oropharyngeal exudate.  Eyes:     General: No scleral icterus.    Extraocular Movements: Extraocular movements intact.  Cardiovascular:     Rate and Rhythm: Normal rate and regular rhythm.  Pulmonary:     Effort:  Pulmonary effort is normal. No respiratory distress.     Breath sounds: Normal breath sounds. No wheezing, rhonchi or rales.  Abdominal:     General: Abdomen is flat. There is no distension.     Palpations: Abdomen is soft.  Musculoskeletal:     Cervical back: Normal range of motion and neck supple.  Lymphadenopathy:     Cervical: No cervical adenopathy.  Skin:    General: Skin is warm and dry.     Capillary Refill: Capillary refill takes less than 2 seconds.     Coloration: Skin is not jaundiced or pale.     Findings: No erythema or rash.  Neurological:     Mental Status: She is alert and oriented to person, place, and time.     Motor: No weakness.  Psychiatric:        Mood and Affect: Mood normal.        Behavior: Behavior normal. Behavior is cooperative.      UC Treatments / Results  Labs (all labs ordered are listed, but only abnormal results are displayed) Labs Reviewed - No data to display  EKG   Radiology No results found.  Procedures Procedures (including critical care time)  Medications Ordered in UC Medications - No data to display  Initial Impression / Assessment and Plan / UC Course  I have reviewed the triage  vital signs and the nursing notes.  Pertinent labs & imaging results that were available during my care of the patient were reviewed by me and considered in my medical decision making (see chart for details).    Patient is a very pleasant, well-appearing 66 year old female presenting with viral upper respiratory symptoms today.  Reassured patient that symptoms and exam findings are most consistent with a viral upper respiratory infection and explained lack of efficacy of antibiotics against viruses.  Discussed expected course and features suggestive of secondary bacterial infection.  Continue supportive care. Increase fluid intake with water or electrolyte solution like pedialyte. Encouraged acetaminophen as needed for fever/pain. Encouraged salt water gargling, chloraseptic spray and throat lozenges. Encouraged OTC guaifenesin. Encouraged saline sinus flushes and/or neti with humidified air.  Start [employer] every 8 hours as needed for cough.  Seek care if symptoms persist or worsen more than 10 days without improvement.  The patient was given the opportunity to ask questions.  All questions answered to their satisfaction.  The patient is in agreement to this plan.    Final Clinical Impressions(s) / UC Diagnoses   Final diagnoses:  Viral URI with cough     Discharge Instructions      Your symptoms and exam findings are most consistent with a viral upper respiratory infection. These usually run their course in about 10 days.  If your symptoms last longer than 10 days without improvement, please follow up with your primary care provider.  If your symptoms, worsen, please go to the Emergency Room.    Some things that can make you feel better are: - Increased rest - Increasing fluid with water/sugar free electrolytes - Acetaminophen as needed for fever/pain.  - Salt water gargling, chloraseptic spray and throat lozenges - OTC guaifenesin (Mucinex).  - Saline sinus flushes or a neti pot.   - Humidifying the air. -Cough suppressant-Tessalon Perles every 8 hours as needed     ED Prescriptions     Medication Sig Dispense Auth. Provider   benzonatate (TESSALON) 100 MG capsule Take 1 capsule (100 mg total) by mouth 3 (three) times daily  as needed for cough. Do not take with alcohol or while driving or operating heavy machinery 21 capsule Eulogio Bear, NP      PDMP not reviewed this encounter.   Eulogio Bear, NP 10/07/21 1351

## 2021-10-07 NOTE — ED Triage Notes (Signed)
Pt reports headache and nasal congestion since last night; cough x 3 days. Acetaminophen and Claritin gives relief.   Negative COVID test today.

## 2021-10-10 ENCOUNTER — Telehealth: Payer: Medicare HMO | Admitting: Physician Assistant

## 2021-10-10 DIAGNOSIS — B9689 Other specified bacterial agents as the cause of diseases classified elsewhere: Secondary | ICD-10-CM | POA: Diagnosis not present

## 2021-10-10 DIAGNOSIS — J019 Acute sinusitis, unspecified: Secondary | ICD-10-CM | POA: Diagnosis not present

## 2021-10-10 MED ORDER — AMOXICILLIN-POT CLAVULANATE 875-125 MG PO TABS: 1.0000 | ORAL_TABLET | Freq: Two times a day (BID) | ORAL | 0 refills | Status: DC

## 2021-10-10 NOTE — Progress Notes (Signed)
I have spent 5 minutes in review of e-visit questionnaire, review and updating patient chart, medical decision making and response to patient.   Terryl Molinelli Cody Brittlyn Cloe, PA-C    

## 2021-10-10 NOTE — Progress Notes (Signed)

## 2021-10-13 ENCOUNTER — Encounter: Payer: Self-pay | Admitting: *Deleted

## 2021-10-22 ENCOUNTER — Other Ambulatory Visit: Payer: Self-pay | Admitting: Family Medicine

## 2021-10-22 ENCOUNTER — Other Ambulatory Visit: Payer: Self-pay | Admitting: Gastroenterology

## 2021-10-22 DIAGNOSIS — I10 Essential (primary) hypertension: Secondary | ICD-10-CM

## 2021-10-22 MED ORDER — BUMETANIDE 1 MG PO TABS: ORAL_TABLET | ORAL | 0 refills | Status: DC

## 2021-11-10 ENCOUNTER — Ambulatory Visit (INDEPENDENT_AMBULATORY_CARE_PROVIDER_SITE_OTHER): Payer: Medicare HMO | Admitting: Family Medicine

## 2021-11-10 VITALS — BP 125/75 | HR 76 | Temp 96.8°F | Ht 68.0 in | Wt 238.0 lb

## 2021-11-10 DIAGNOSIS — K432 Incisional hernia without obstruction or gangrene: Secondary | ICD-10-CM | POA: Insufficient documentation

## 2021-11-10 DIAGNOSIS — E785 Hyperlipidemia, unspecified: Secondary | ICD-10-CM

## 2021-11-10 DIAGNOSIS — R931 Abnormal findings on diagnostic imaging of heart and coronary circulation: Secondary | ICD-10-CM

## 2021-11-10 DIAGNOSIS — R42 Dizziness and giddiness: Secondary | ICD-10-CM | POA: Insufficient documentation

## 2021-11-10 DIAGNOSIS — I10 Essential (primary) hypertension: Secondary | ICD-10-CM | POA: Diagnosis not present

## 2021-11-10 MED ORDER — LOSARTAN POTASSIUM 25 MG PO TABS: 25.0000 mg | ORAL_TABLET | Freq: Every day | ORAL | 1 refills | Status: DC

## 2021-11-10 NOTE — Assessment & Plan Note (Signed)
Given low EF. Adding back Losartan. Labs in 7-10 days.

## 2021-11-10 NOTE — Assessment & Plan Note (Signed)
Referring to vestibular rehab.

## 2021-11-10 NOTE — Assessment & Plan Note (Signed)
Referring to General surgery.

## 2021-11-10 NOTE — Progress Notes (Signed)
Subjective:  Patient ID: Deanna Thompson, female    DOB: May 25, 1955  Age: 66 y.o. MRN: [id number]  CC: Chief Complaint  Patient presents with   Dizziness    3 weeks ago , referral request   discuss medication    Bumex and losartan   Mass    In upper abdominal area when coughing    HPI:  66 year old female with a PMH of HTN, history of brain aneurysm, cirrhosis, GERD, CKD, psoriatic arthritis, bipolar disorder, history of CVA, hyperlipidemia, history of anemia presents for follow up.  Patient follows with GI regarding cirrhosis. She is currently on Bumex and spironolactone.   Hx of decreased EF on Echo. No symptoms of CHF at this time or in the recent past. Patient has not been on Losartan (ran out). She wants to know if we should restart.  BP is well controlled.   Patient reports that she is having intermittent issues with BPPV. She has had this in the past and would like referral for vestibular rehab.  Also, she has recently noted a bulge/fullness in the upper abdomen particularly when she coughs.    Patient Active Problem List   Diagnosis Date Noted   Vertigo 11/10/2021   Incisional hernia, without obstruction or gangrene 11/10/2021   Bipolar disorder (HCC) 01/13/2021   Hyperlipidemia 01/13/2021   Decreased cardiac ejection fraction 01/13/2021   IBS (irritable bowel syndrome)    History of stroke 08/01/2017   Essential hypertension 07/31/2017   GERD (gastroesophageal reflux disease) 07/31/2017   Brain aneurysm 07/31/2017   CKD (chronic kidney disease), stage III (HCC) 07/31/2017   LBBB (left bundle branch block) 07/31/2017   Psoriatic arthritis (HCC) 04/15/2015   Cirrhosis of liver (HCC) 09/19/2014   Iron deficiency anemia due to chronic blood loss    Insomnia 03/17/2014    Social Hx   Social History   Socioeconomic History   Marital status: Married    Spouse name: Transport planner   Number of children: 0   Years of education: Not on file   Highest education level:  Not on file  Occupational History    Employer: RETIRED  Tobacco Use   Smoking status: Former    Packs/day: 2.00    Years: 12.00    Total pack years: 24.00    Types: Cigarettes    Quit date: 03/31/1995    Years since quitting: 26.6   Smokeless tobacco: Never   Tobacco comments:    quit in 1997  Vaping Use   Vaping Use: Never used  Substance and Sexual Activity   Alcohol use: No    Comment: Bottle of wine a day for years, but quit in 07/2011   Drug use: No   Sexual activity: Not Currently    Birth control/protection: Post-menopausal  Other Topics Concern   Not on file  Social History Narrative   Lives   Caffeine use:    Quit drinking alcohol 3 YEARS ago. No drugs, no IVDU.    Social Determinants of Health   Financial Resource Strain: Not on file  Food Insecurity: Not on file  Transportation Needs: Not on file  Physical Activity: Not on file  Stress: Not on file  Social Connections: Not on file    Review of Systems Per HPI  Objective:  BP 125/75   Pulse 76   Temp (!) 96.8 F (36 C)   Ht 5\' 8"  (1.727 m)   Wt 238 lb (108 kg)   SpO2 96%   BMI 36.19  kg/m      11/10/2021   10:54 AM 10/07/2021    1:00 PM 07/30/2021   10:30 AM  BP/Weight  Systolic BP 161 096 045  Diastolic BP 75 75 66  Wt. (Lbs) 238  242  BMI 36.19 kg/m2  36.8 kg/m2    Physical Exam Vitals and nursing note reviewed.  Constitutional:      Appearance: Normal appearance. She is obese.  HENT:     Head: Normocephalic and atraumatic.  Cardiovascular:     Rate and Rhythm: Normal rate and regular rhythm.  Pulmonary:     Effort: Pulmonary effort is normal.     Breath sounds: Normal breath sounds. No wheezing, rhonchi or rales.  Abdominal:     General: There is no distension.     Palpations: Abdomen is soft.     Comments: Epigastric region - incisional hernia noted.   Neurological:     Mental Status: She is alert.  Psychiatric:        Mood and Affect: Mood normal.        Behavior: Behavior  normal.     Lab Results  Component Value Date   WBC 6.5 09/05/2021   HGB 13.2 09/05/2021   HCT 41.0 09/05/2021   PLT 258 09/05/2021   GLUCOSE 100 (H) 07/30/2021   CHOL 160 01/14/2021   TRIG 126 01/14/2021   HDL 79 01/14/2021   LDLCALC 59 01/14/2021   ALT 25 07/30/2021   AST 26 07/30/2021   NA 138 07/30/2021   K 3.7 07/30/2021   CL 103 07/30/2021   CREATININE 1.40 (H) 07/30/2021   BUN 16 07/30/2021   CO2 23 07/30/2021   TSH 8.256 (H) 08/25/2011   INR 1.1 (H) 07/30/2021   HGBA1C 5.8 (H) 08/01/2017     Assessment & Plan:   Problem List Items Addressed This Visit       Cardiovascular and Mediastinum   Essential hypertension - Primary    Given low EF. Adding back Losartan. Labs in 7-10 days.       Relevant Medications   losartan (COZAAR) 25 MG tablet   Other Relevant Orders   CMP14+EGFR     Other   Decreased cardiac ejection fraction    Referring to cardiology.       Relevant Orders   Ambulatory referral to Cardiology   Hyperlipidemia   Relevant Medications   losartan (COZAAR) 25 MG tablet   Other Relevant Orders   Lipid panel   Incisional hernia, without obstruction or gangrene    Referring to General surgery.      Relevant Orders   Ambulatory referral to General Surgery   Vertigo    Referring to vestibular rehab.       Relevant Orders   Ambulatory referral to Physical Therapy    Meds ordered this encounter  Medications   losartan (COZAAR) 25 MG tablet    Sig: Take 1 tablet (25 mg total) by mouth daily.    Dispense:  90 tablet    Refill:  1    Follow-up:  Return in about 6 months (around 05/13/2022).  [place]

## 2021-11-10 NOTE — Assessment & Plan Note (Signed)
Referring to cardiology.

## 2021-11-10 NOTE — Patient Instructions (Signed)
Referrals placed.   Restart losartan.  Labs in 7-10 days.  Follow up in 6 months.

## 2021-11-21 NOTE — Progress Notes (Signed)
Cardiology Office Note:    Date:  11/28/2021   ID:  [name], DOB [birth year], MRN [id number]  PCP:  Coral Spikes, [place] [facility] Cardiologist:  Javeah Loeza [place] MD Click to update primary MD,subspecialty MD or APP then REFRESH:1}    Referring MD: Coral Spikes, DO   Chief Complaint  Patient presents with   New Patient (Initial Visit)   Congestive Heart Failure    History of Present Illness:    Deanna Thompson is a 66 y.o. female seen at the request of Dr Lacinda Axon for evaluation of CHF and LBBB. She has a history of HTN, cirrhosis, cerebral aneurysm s/p clip, bipolar disorder and connective tissue disorder. Also has a history of CHF and LBBB. Seen by Dr Doylene Canard in the past and last seen by Dr Carlyle Dolly in August 2019. Prior Echo in 2019 showed AV sclerosis and trivial MR with EF 45-50%. Myoview in 2019 showed a lot of gut intake. No significant perfusion abnormality. Volume overload mainly due to cirrhosis in the past and on aldactone. Recent Abd [place] in May showed cirrhosis without significant ascites. Prior CT scans show aortic and coronary calcification. She did have multiple paracenteses in 2014.   She states she is doing well. Denies any SOB or chest pain. No swelling. States at one point her aldactone was stopped and she gained 13 lbs in one week. She has lost 35 lbs in the past year with diet. Her only complaint is that she feels tired a lot.   Past Medical History:  Diagnosis Date   Abdominal hernia    repaired   Abscess of pulp of tooth 12/12/2014   left side   Anemia    Anxiety    Arthritis    back  and down flank  right side more than left   B12 deficiency 06/12/2015   Bipolar disorder ([place])    Brain aneurysm    METAL COIL PREVENTS MRI IMAGING and CLIP   Cirrhosis ([place])    Presumably alcoholic cirrhosis   Complication of anesthesia    Depression    ETOH abuse    Quit in 07/2011    Fatigue    due to Sjogren's disease   Flat foot (pes  planus) (acquired), left foot    Folate deficiency 06/30/2016   GERD (gastroesophageal reflux disease)    HTN (hypertension)    no longer medicated, dated on 04/25/12   IBS (irritable bowel syndrome)    Iron deficiency anemia due to chronic blood loss    Myofascial pain    Panic attack    PONV (postoperative nausea and vomiting)    Psoriatic arthritis ([place])    Right inguinal hernia 08/16/2012   Seizures ([place]) 1997   1 seizure from coil insertion but no more seizures and no meds for seizures   Sjogren's disease ([place])    Stroke ([place]) 2019   [place] 02/03/2007   Qualifier: Diagnosis of  By: Aline Brochure MD, Dorothyann Peng     TIA (transient ischemic attack)    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([place])    Vertigo     Past Surgical History:  Procedure Laterality Date   AGILE CAPSULE N/A 06/21/2014   Procedure: AGILE CAPSULE;  Surgeon: Danie Binder, MD;  [facility];  Service: Endoscopy;  Laterality: N/A;  0700   BIOPSY  10/13/2011   SLF: mild gastritis/duodenal polypoid lesion    BIOPSY  06/04/2020   Procedure: BIOPSY;  Surgeon: Hurshel Keys  K, DO;  Location[facility];  Service: Endoscopy;;   CEREBRAL ANEURYSM REPAIR  1997   CHOLECYSTECTOMY N/A 10/28/2018   Procedure: LAPAROSCOPIC CHOLECYSTECTOMY WITH INTRAOPERATIVE CHOLANGIOGRAM;  Surgeon: Virl Cagey, MD;  Location: [facility];  Service: General;  Laterality: N/A;   COLONOSCOPY  10/13/2011   FXT:[id number] hemorrhoids/varices rectal/lesion in ascending colon(HYPERPLASTIC POLYP)   COLONOSCOPY N/A 01/17/2013   SLF: 1. Internal hemorrhoids 2. Ascending colon polyps 3.  Rectal varices.    COLONOSCOPY N/A 08/10/2016   Dr. Oneida Alar: redundant left colon. external and internal hemorhroids. 5-10 year surveillance   ESOPHAGOGASTRODUODENOSCOPY N/A 06/29/2014   SLF: 1. anemia due to polypoid lesions in teh antrum, and duodenal polyps 2. single duodenum AVM   ESOPHAGOGASTRODUODENOSCOPY (EGD) WITH PROPOFOL N/A  03/07/2019   Procedure: ESOPHAGOGASTRODUODENOSCOPY (EGD) WITH PROPOFOL;  Surgeon: Danie Binder, MD;  [facility];  Service: Endoscopy;  Laterality: N/A;  1:00pm   ESOPHAGOGASTRODUODENOSCOPY (EGD) WITH PROPOFOL N/A 03/16/2019   Procedure: ESOPHAGOGASTRODUODENOSCOPY (EGD) WITH PROPOFOL;  Surgeon: Danie Binder, MD;  [facility];  Service: Endoscopy;  Laterality: N/A;   ESOPHAGOGASTRODUODENOSCOPY (EGD) WITH PROPOFOL N/A 06/04/2020   Procedure: ESOPHAGOGASTRODUODENOSCOPY (EGD) WITH PROPOFOL;  Surgeon: Eloise Harman, DO;  [facility];  Service: Endoscopy;  Laterality: N/A;  am   FLEXIBLE SIGMOIDOSCOPY N/A 01/21/2016   Procedure: FLEXIBLE SIGMOIDOSCOPY;  Surgeon: Danie Binder, MD;  [facility];  Service: Endoscopy;  Laterality: N/A;  [place] N/A 06/29/2014   Procedure: GIVENS CAPSULE STUDY;  Surgeon: Danie Binder, MD;  [facility];  Service: Endoscopy;  Laterality: N/A;   GIVENS CAPSULE STUDY N/A 07/13/2014   Procedure: GIVENS CAPSULE STUDY;  Surgeon: Danie Binder, MD;  [facility];  Service: Endoscopy;  Laterality: N/A;  [place] N/A 01/21/2016   Procedure: HEMORRHOID BANDING;  Surgeon: Danie Binder, MD;  [facility];  Service: Endoscopy;  Laterality: N/A;   HEMOSTASIS CLIP PLACEMENT  03/16/2019   Procedure: HEMOSTASIS CLIP PLACEMENT;  Surgeon: Danie Binder, MD;  [facility];  Service: Endoscopy;;  x 4   HEMOSTASIS CONTROL  03/16/2019   Procedure: HEMOSTASIS CONTROL;  Surgeon: Danie Binder, MD;  [facility];  Service: Endoscopy;;  injection with epinephine 0.'1mg'$ /ml via scope and bipolar cautery with gold probe   INGUINAL HERNIA REPAIR Right 08/28/2013   Procedure: RIGHT [place];  Surgeon: Jamesetta So, MD;  Location: [facility];  Service: General;  Laterality: Right;   INSERTION OF MESH N/A 06/09/2013   Procedure: INSERTION  OF MESH;  Surgeon: Jamesetta So, MD;  Location: [facility];  Service: General;  Laterality: N/A;   INSERTION OF MESH Right 08/28/2013   Procedure: INSERTION OF MESH;  Surgeon: Jamesetta So, MD;  Location: [facility];  Service: General;  Laterality: Right;   PARACENTESIS  Feb 2014   10.7 liters   TOOTH EXTRACTION Left 06/26/90   UMBILICAL HERNIA REPAIR N/A 06/09/2013   Procedure: UMBILICAL HERNIORRHAPHY WITH MESH;  Surgeon: Jamesetta So, MD;  Location: [facility];  Service: General;  Laterality: N/A;    Current Medications: Current Meds  Medication Sig   acetaminophen (TYLENOL) 500 MG tablet Take 500 mg by mouth every 6 (six) hours as needed.   aspirin EC 81 MG tablet Take 81 mg by mouth daily. Swallow whole.   atorvastatin (LIPITOR) 40 MG tablet TAKE ONE  TABLET BY MOUTH ONCE DAILY.   benzonatate (TESSALON) 100 MG capsule Take 1 capsule (100 mg total) by mouth 3 (three) times daily as needed for cough. Do not take with alcohol or while driving or operating heavy machinery   bumetanide (BUMEX) 1 MG tablet TAKE (1) TABLET BY MOUTH TWICE DAILY.   buPROPion (WELLBUTRIN XL) 300 MG 24 hr tablet Take 300 mg by mouth daily.   Camphor-Menthol-Methyl Sal (SALONPAS) 3.04-04-08 % PTCH Place 1 patch onto the skin daily as needed (pain.).   cholecalciferol (VITAMIN D3) 25 MCG (1000 UT) tablet Take 2 tablets (2,000 Units total) by mouth daily. (Patient taking differently: Take 1,000 Units by mouth every morning.)   DULoxetine (CYMBALTA) 60 MG capsule 1 capsule   famotidine (PEPCID) 20 MG tablet Take 1 tablet (20 mg total) by mouth 2 (two) times daily before a meal.   fluticasone (FLONASE) 50 MCG/ACT nasal spray Place 1 spray into both nostrils See admin instructions. Instill 1 spray into each nostril every morning, may also use later in the day as needed for sinus congestion   lamoTRIgine (LAMICTAL) 200 MG tablet Take 200 mg by mouth daily.   LINZESS 145 MCG CAPS capsule TAKE (1) CAPSULE BY MOUTH EVERY DAY BEFORE  BREAKFAST.   loratadine (CLARITIN) 10 MG tablet Take 10 mg by mouth daily as needed.   losartan (COZAAR) 25 MG tablet Take 1 tablet (25 mg total) by mouth daily.   Polyvinyl Alcohol-Povidone (REFRESH OP) Place 1 drop into both eyes 3 (three) times daily as needed (dry eyes).   Simethicone (GAS-X PO) Take 1 tablet by mouth 2 (two) times daily as needed (bloating/flatulence).   sodium chloride (OCEAN) 0.65 % nasal spray Place 1 spray into the nose 2 (two) times daily as needed for congestion.   spironolactone (ALDACTONE) 100 MG tablet TAKE 1/2 TABLET BY MOUTH TWICE DAILY.   Tofacitinib Citrate ER (XELJANZ XR) 11 MG TB24 Take 11 mg by mouth daily.    XIIDRA 5 % SOLN Apply 1 drop to eye 2 (two) times daily.     Allergies:   Omeprazole, Orencia [abatacept], Other, Phenytoin, Keflex [cephalexin], Lidocaine viscous hcl, Meat extract, and Promethazine hcl   Social History   Socioeconomic History   Marital status: Married    Spouse name: Management consultant   Number of children: 0   Years of education: Not on file   Highest education level: Not on file  Occupational History    Employer: RETIRED  Tobacco Use   Smoking status: Former    Packs/day: 2.00    Years: 12.00    Total pack years: 24.00    Types: Cigarettes    Quit date: 03/31/1995    Years since quitting: 26.6   Smokeless tobacco: Never   Tobacco comments:    quit in 1997  Vaping Use   Vaping Use: Never used  Substance and Sexual Activity   Alcohol use: No    Comment: Bottle of wine a day for years, but quit in 07/2011   Drug use: No   Sexual activity: Not Currently    Birth control/protection: Post-menopausal  Other Topics Concern   Not on file  Social History Narrative   Lives   Caffeine use:    Quit drinking alcohol 3 YEARS ago. No drugs, no IVDU.    Social Determinants of Health   Financial Resource Strain: Not on file  Food Insecurity: Not on file  Transportation Needs: Not on file  Physical Activity: Not on file  Stress:  Not on file  Social Connections: Not on file     Family History: The patient's family history includes Asthma in an other family member; COPD in her father; Cirrhosis in her mother; Heart disease in her father and another family member. There is no history of Colon cancer, Inflammatory bowel disease, Stomach cancer, or Esophageal cancer.  ROS:   Please see the history of present illness.     All other systems reviewed and are negative.  EKGs/Labs/Other Studies Reviewed:    The following studies were reviewed today:   Myoview 12/03/17:  Narrative & Impression  The left ventricular ejection fraction is mildly decreased (45-54%). Very severe gut radiotracer uptake in the resting and post-injection images, most significant in the post-injection images. Overall perfusion imaging is nondiagnostic. Low LVEF is suggested however looks normal by visual evaluation. Consider correlating with echo     Echo 03/28/21: IMPRESSIONS     1. Left ventricular ejection fraction, by estimation, is 45 to 50%. The  left ventricle has mildly decreased function. The left ventricle  demonstrates global hypokinesis. The left ventricular internal cavity size  was mildly dilated. Left ventricular  diastolic parameters were normal.   2. Right ventricular systolic function is normal. The right ventricular  size is normal.   3. Left atrial size was moderately dilated.   4. The mitral valve is abnormal. Trivial mitral valve regurgitation. No  evidence of mitral stenosis. Moderate mitral annular calcification.   5. The aortic valve is tricuspid. There is moderate calcification of the  aortic valve. There is moderate thickening of the aortic valve. Aortic  valve regurgitation is not visualized. Aortic valve sclerosis is present,  with no evidence of aortic valve  stenosis.   6. The inferior vena cava is normal in size with greater than 50%  respiratory variability, suggesting right atrial pressure of 3 mmHg.    EKG:  EKG is ordered today.  The ekg ordered today demonstrates NSR rate 85, [email or web address].I have personally reviewed and interpreted this study.   Recent Labs: 07/30/2021: ALT 25; BUN 16; Creatinine, Ser 1.40; Potassium 3.7; Sodium 138 09/05/2021: Hemoglobin 13.2; Platelets 258  Recent Lipid Panel    Component Value Date/Time   CHOL 160 01/14/2021 1155   TRIG 126 01/14/2021 1155   HDL 79 01/14/2021 1155   CHOLHDL 2.0 01/14/2021 1155   CHOLHDL 4.0 08/01/2017 0500   VLDL 20 08/01/2017 0500   LDLCALC 59 01/14/2021 1155     Risk Assessment/Calculations:                Physical Exam:    VS:  BP 116/62 (BP Location: Left Arm, Patient Position: Sitting, Cuff Size: Large)   Pulse 85   Ht '5\' 8"'$  (1.727 m)   Wt 235 lb (106.6 kg)   BMI 35.73 kg/m     Wt Readings from Last 3 Encounters:  11/28/21 235 lb (106.6 kg)  11/10/21 238 lb (108 kg)  07/30/21 242 lb (109.8 kg)     GEN:  Well nourished, well developed in no acute distress HEENT: Normal NECK: No JVD; No carotid bruits LYMPHATICS: No lymphadenopathy CARDIAC: RRR, no murmurs, rubs, gallops RESPIRATORY:  Clear to auscultation without rales, wheezing or rhonchi  ABDOMEN: Soft, non-tender, non-distended MUSCULOSKELETAL:  No edema; No deformity  SKIN: Warm and dry NEUROLOGIC:  Alert and oriented x 3 PSYCHIATRIC:  Normal affect   ASSESSMENT:    1. Chronic combined systolic and diastolic CHF (congestive heart failure) ([place])   2. Alcoholic cirrhosis of  liver without ascites ([place])   3. LBBB (left bundle branch block)   4. Coronary artery calcification   5. Hyperlipidemia, unspecified hyperlipidemia type    PLAN:    In order of problems listed above:  Chronic combined systolic/diastolic CHF. I have personally reviewed her prior Echos. She does have mild  LV dysfunction primarily related to dyssynergy with her LBBB. She is class 1. Volume status looks good. She is on losartan. Continue current therapy. I suspect in the past her  edema/ascites was primarily due to cirrhosis and not CHF. She has done very well on combination of aldactone and Bumex Cirrhosis of the liver. Ascites in the past. Controlled with diuretic therapy. Sodium restriction Coronary artery calcification. No angina. No perfusion abnormality on prior Myoview study. Continue ASA 81 mg daily. Continue statin HLD excellent control on statin.  LBBB chronic.       Follow up with me in one year     Medication Adjustments/Labs and Tests Ordered: Current medicines are reviewed at length with the patient today.  Concerns regarding medicines are outlined above.  No orders of the defined types were placed in this encounter.  No orders of the defined types were placed in this encounter.   There are no Patient Instructions on file for this visit.   Signed, Aleyda Gindlesperger [place], MD  11/28/2021 2:01 PM    [place]

## 2021-11-25 ENCOUNTER — Other Ambulatory Visit: Payer: Self-pay | Admitting: Family Medicine

## 2021-11-25 DIAGNOSIS — I10 Essential (primary) hypertension: Secondary | ICD-10-CM

## 2021-11-28 ENCOUNTER — Other Ambulatory Visit: Payer: Self-pay | Admitting: Family Medicine

## 2021-11-28 ENCOUNTER — Ambulatory Visit: Payer: Medicare HMO | Attending: Cardiology | Admitting: Cardiology

## 2021-11-28 ENCOUNTER — Encounter: Payer: Self-pay | Admitting: Cardiology

## 2021-11-28 VITALS — BP 116/62 | HR 85 | Ht 68.0 in | Wt 235.0 lb

## 2021-11-28 DIAGNOSIS — I447 Left bundle-branch block, unspecified: Secondary | ICD-10-CM

## 2021-11-28 DIAGNOSIS — I2584 Coronary atherosclerosis due to calcified coronary lesion: Secondary | ICD-10-CM

## 2021-11-28 DIAGNOSIS — I5042 Chronic combined systolic (congestive) and diastolic (congestive) heart failure: Secondary | ICD-10-CM | POA: Diagnosis not present

## 2021-11-28 DIAGNOSIS — K7030 Alcoholic cirrhosis of liver without ascites: Secondary | ICD-10-CM

## 2021-11-28 DIAGNOSIS — I2510 Atherosclerotic heart disease of native coronary artery without angina pectoris: Secondary | ICD-10-CM

## 2021-11-28 DIAGNOSIS — E785 Hyperlipidemia, unspecified: Secondary | ICD-10-CM

## 2021-11-28 DIAGNOSIS — I10 Essential (primary) hypertension: Secondary | ICD-10-CM

## 2021-12-04 ENCOUNTER — Ambulatory Visit: Payer: Medicare HMO | Admitting: General Surgery

## 2021-12-04 ENCOUNTER — Encounter: Payer: Self-pay | Admitting: General Surgery

## 2021-12-04 ENCOUNTER — Ambulatory Visit (HOSPITAL_COMMUNITY): Payer: Medicare HMO

## 2021-12-04 VITALS — BP 127/78 | HR 78 | Temp 98.6°F | Resp 14 | Ht 68.0 in | Wt 240.0 lb

## 2021-12-04 DIAGNOSIS — K432 Incisional hernia without obstruction or gangrene: Secondary | ICD-10-CM

## 2021-12-05 NOTE — Progress Notes (Signed)
[facility]  Patient had to leave for emergency. Did not get to see.  Will reschedule. No charge for visit.   Curlene Labrum, MD [facility] [address] [place], [place] [phone] (office)

## 2021-12-15 ENCOUNTER — Encounter: Payer: Self-pay | Admitting: Family Medicine

## 2021-12-16 ENCOUNTER — Inpatient Hospital Stay: Payer: Medicare HMO | Attending: Hematology

## 2021-12-16 DIAGNOSIS — I129 Hypertensive chronic kidney disease with stage 1 through stage 4 chronic kidney disease, or unspecified chronic kidney disease: Secondary | ICD-10-CM | POA: Insufficient documentation

## 2021-12-16 DIAGNOSIS — K31811 Angiodysplasia of stomach and duodenum with bleeding: Secondary | ICD-10-CM | POA: Insufficient documentation

## 2021-12-16 DIAGNOSIS — N1831 Chronic kidney disease, stage 3a: Secondary | ICD-10-CM | POA: Insufficient documentation

## 2021-12-16 DIAGNOSIS — E785 Hyperlipidemia, unspecified: Secondary | ICD-10-CM | POA: Insufficient documentation

## 2021-12-16 DIAGNOSIS — Z8673 Personal history of transient ischemic attack (TIA), and cerebral infarction without residual deficits: Secondary | ICD-10-CM | POA: Insufficient documentation

## 2021-12-16 DIAGNOSIS — D500 Iron deficiency anemia secondary to blood loss (chronic): Secondary | ICD-10-CM | POA: Insufficient documentation

## 2021-12-16 LAB — CBC WITH DIFFERENTIAL/PLATELET
Abs Immature Granulocytes: 0.02 10*3/uL (ref 0.00–0.07)
Basophils Absolute: 0 10*3/uL (ref 0.0–0.1)
Basophils Relative: 1 %
Eosinophils Absolute: 0.3 10*3/uL (ref 0.0–0.5)
Eosinophils Relative: 4 %
HCT: 38.3 % (ref 36.0–46.0)
Hemoglobin: 12.6 g/dL (ref 12.0–15.0)
Immature Granulocytes: 0 %
Lymphocytes Relative: 12 %
Lymphs Abs: 0.7 10*3/uL (ref 0.7–4.0)
MCH: 30.1 pg (ref 26.0–34.0)
MCHC: 32.9 g/dL (ref 30.0–36.0)
MCV: 91.4 fL (ref 80.0–100.0)
Monocytes Absolute: 0.6 10*3/uL (ref 0.1–1.0)
Monocytes Relative: 10 %
Neutro Abs: 4.6 10*3/uL (ref 1.7–7.7)
Neutrophils Relative %: 73 %
Platelets: 332 10*3/uL (ref 150–400)
RBC: 4.19 MIL/uL (ref 3.87–5.11)
RDW: 15.1 % (ref 11.5–15.5)
WBC: 6.3 10*3/uL (ref 4.0–10.5)
nRBC: 0 % (ref 0.0–0.2)

## 2021-12-16 LAB — FERRITIN: Ferritin: 26 ng/mL (ref 11–307)

## 2021-12-16 LAB — IRON AND TIBC
Iron: 94 ug/dL (ref 28–170)
Saturation Ratios: 21 % (ref 10.4–31.8)
TIBC: 455 ug/dL — ABNORMAL HIGH (ref 250–450)
UIBC: 361 ug/dL

## 2021-12-17 NOTE — Progress Notes (Unsigned)
Virtual Visit via Telephone Note [facility]  I connected with Deanna Thompson  on 12/18/2021 at 3:45 PM by telephone and verified that I am speaking with the correct person using two identifiers.  Location: Patient: Home Provider: [facility]   I discussed the limitations, risks, security and privacy concerns of performing an evaluation and management service by telephone and the availability of in person appointments. I also discussed with the patient that there may be a patient responsible charge related to this service. The patient expressed understanding and agreed to proceed.  REASON FOR VISIT:  Follow-up for iron deficiency anemia   CURRENT THERAPY: Intermittent IV iron infusions   INTERVAL HISTORY:  Deanna Thompson 66 y.o. female returns for routine follow-up of her iron deficiency anemia.  She was last evaluated by Tarri Abernethy PA-C via telemedicine visit on 09/15/2021.   Her last IV iron was with Venofer 300 mg x 3 doses from 06/06/2021 through 07/08/2021.  She has noticed some recurrent fatigue over the past few months, states that she is wearing out easier.  She denies any pica, restless legs, headaches, chest pain, dyspnea on exertion, lightheadedness, or syncope. She has a history of bleeding AVMs, but has not had any recent hematemesis, hematochezia, or melena.  She has 75% energy and 100% appetite. She endorses that she is maintaining a stable weight.    OBSERVATIONS/OBJECTIVE: Review of Systems  Constitutional:  Positive for malaise/fatigue. Negative for chills, diaphoresis, fever and weight loss.  Respiratory:  Negative for cough and shortness of breath.   Cardiovascular:  Negative for chest pain and palpitations.  Gastrointestinal:  Negative for abdominal pain, blood in stool, melena, nausea and vomiting.  Neurological:  Negative for dizziness and headaches.     PHYSICAL EXAM (per limitations of virtual telephone visit): The patient is  alert and oriented x 3, exhibiting adequate mentation, good mood, and ability to speak in full sentences and execute sound judgement.   ASSESSMENT & PLAN: 1.  Iron deficiency anemia due to chronic blood loss - Due to chronic GI blood loss. - Patient was hospitalized in December 2020 for an AVM bleed where her hemoglobin dropped to 7.7. 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  Injected.  Treated with bipolar cautery.  Clips were placed. - She has been transfused RBCs in the past - She has been referred to gastroenterology, and previously followed with Dr. Oneida Alar - She last received IV Venofer x3 on 06/06/2021 through 07/08/2021 - No bright red blood per rectum or melena  - Symptomatic with recurrent fatigue - Most recent labs (12/16/2021): Hgb 12.6/MCV 91.4.  Ferritin 26, iron saturation 21% with elevated TIBC 455 - PLAN: Recommend additional IV iron with Venofer 300 mg x 3. (We will not premedicate - although patient has multiple medication allergies listed, she has previously tolerated Venofer well without premedication.) --  Repeat anemia panel and RTC (annual OFFICE visit) in 3 months.    2.  Other history - Her other pertinent medical history includes CKD stage IIIa,  liver cirrhosis, history of cerebral aneurysm with stroke, hyperlipidemia, and hypertension    FOLLOW UP INSTRUCTIONS: Venofer 300 mg x 3 Same-day labs and office visit in 3 months    I discussed the assessment and treatment plan with the patient. The patient was provided an opportunity to ask questions and all were answered. The patient agreed with the plan and demonstrated  an understanding of the instructions.   The patient was advised to call back or seek an in-person evaluation if the symptoms worsen or if the condition fails to improve as anticipated.  I provided 16 minutes of non-face-to-face time during this encounter.   Harriett Rush, PA-C 12/18/21 4:01 PM

## 2021-12-18 ENCOUNTER — Encounter: Payer: Self-pay | Admitting: Physician Assistant

## 2021-12-18 ENCOUNTER — Inpatient Hospital Stay (HOSPITAL_BASED_OUTPATIENT_CLINIC_OR_DEPARTMENT_OTHER): Payer: Medicare HMO | Admitting: Physician Assistant

## 2021-12-18 VITALS — Wt 231.0 lb

## 2021-12-18 DIAGNOSIS — D500 Iron deficiency anemia secondary to blood loss (chronic): Secondary | ICD-10-CM

## 2021-12-29 ENCOUNTER — Telehealth: Payer: Self-pay

## 2021-12-29 NOTE — Telephone Encounter (Signed)
-----   Message from [place] sent at 07/31/2021 10:08 AM EDT ----- Regarding: November 2023 Pt to see DJ dx cirrhosis

## 2021-12-29 NOTE — Telephone Encounter (Signed)
Left message for pt to return call to office to schedule follow up. Will continue efforts.

## 2021-12-30 NOTE — Telephone Encounter (Signed)
Patient aware of appointment date and time on 02-02-22 at 11:10 am with Dr Lorenso Courier.

## 2022-01-01 ENCOUNTER — Other Ambulatory Visit: Payer: Self-pay | Admitting: Family Medicine

## 2022-01-01 ENCOUNTER — Ambulatory Visit: Payer: Medicare HMO

## 2022-01-01 DIAGNOSIS — I10 Essential (primary) hypertension: Secondary | ICD-10-CM

## 2022-01-02 ENCOUNTER — Inpatient Hospital Stay: Payer: Medicare HMO | Attending: Hematology

## 2022-01-02 VITALS — BP 116/70 | HR 63 | Temp 97.0°F | Resp 18

## 2022-01-02 DIAGNOSIS — Z79899 Other long term (current) drug therapy: Secondary | ICD-10-CM | POA: Diagnosis not present

## 2022-01-02 DIAGNOSIS — D500 Iron deficiency anemia secondary to blood loss (chronic): Secondary | ICD-10-CM | POA: Insufficient documentation

## 2022-01-02 DIAGNOSIS — K31811 Angiodysplasia of stomach and duodenum with bleeding: Secondary | ICD-10-CM | POA: Diagnosis present

## 2022-01-02 MED ORDER — ACETAMINOPHEN 325 MG PO TABS
650.0000 mg | ORAL_TABLET | Freq: Once | ORAL | Status: AC
  Administered 2022-01-02: 650 mg via ORAL
  Filled 2022-01-02: qty 2

## 2022-01-02 MED ORDER — SODIUM CHLORIDE 0.9 % IV SOLN: Freq: Once | INTRAVENOUS | Status: AC

## 2022-01-02 MED ORDER — LORATADINE 10 MG PO TABS
10.0000 mg | ORAL_TABLET | Freq: Once | ORAL | Status: AC
  Administered 2022-01-02: 10 mg via ORAL
  Filled 2022-01-02: qty 1

## 2022-01-02 MED ORDER — SODIUM CHLORIDE 0.9 % IV SOLN
300.0000 mg | Freq: Once | INTRAVENOUS | Status: AC
  Administered 2022-01-02: 300 mg via INTRAVENOUS
  Filled 2022-01-02: qty 300

## 2022-01-02 NOTE — Progress Notes (Unsigned)
Pt presents today for Venofer IV iron infusion per provider's order. Vital signs stable and pt voiced no new complaints at this time.  Peripheral IV started with good blood return

## 2022-01-08 ENCOUNTER — Ambulatory Visit (HOSPITAL_COMMUNITY): Payer: Medicare HMO | Admitting: Physical Therapy

## 2022-01-09 ENCOUNTER — Inpatient Hospital Stay: Payer: Medicare HMO

## 2022-01-09 VITALS — BP 129/73 | HR 73 | Temp 97.6°F | Resp 20

## 2022-01-09 DIAGNOSIS — D500 Iron deficiency anemia secondary to blood loss (chronic): Secondary | ICD-10-CM

## 2022-01-09 MED ORDER — ACETAMINOPHEN 325 MG PO TABS: 650.0000 mg | ORAL_TABLET | Freq: Once | ORAL | Status: DC

## 2022-01-09 MED ORDER — SODIUM CHLORIDE 0.9 % IV SOLN: Freq: Once | INTRAVENOUS | Status: AC

## 2022-01-09 MED ORDER — LORATADINE 10 MG PO TABS: 10.0000 mg | ORAL_TABLET | Freq: Once | ORAL | Status: DC

## 2022-01-09 MED ORDER — SODIUM CHLORIDE 0.9 % IV SOLN
300.0000 mg | Freq: Once | INTRAVENOUS | Status: AC
  Administered 2022-01-09: 300 mg via INTRAVENOUS
  Filled 2022-01-09: qty 300

## 2022-01-09 NOTE — Patient Instructions (Signed)
[facility]  Discharge Instructions: Thank you for choosing [facility] to provide your oncology and hematology care.  If you have a lab appointment with the Cancer Center, please come in thru the Main Entrance and check in at the main information desk.  Wear comfortable clothing and clothing appropriate for easy access to any Portacath or PICC line.   We strive to give you quality time with your provider. You may need to reschedule your appointment if you arrive late (15 or more minutes).  Arriving late affects you and other patients whose appointments are after yours.  Also, if you miss three or more appointments without notifying the office, you may be dismissed from the clinic at the provider's discretion.      For prescription refill requests, have your pharmacy contact our office and allow 72 hours for refills to be completed.    Today you received the following chemotherapy and/or immunotherapy agents Venofer      To help prevent nausea and vomiting after your treatment, we encourage you to take your nausea medication as directed.  BELOW ARE SYMPTOMS THAT SHOULD BE REPORTED IMMEDIATELY: *FEVER GREATER THAN 100.4 F (38 C) OR HIGHER *CHILLS OR SWEATING *NAUSEA AND VOMITING THAT IS NOT CONTROLLED WITH YOUR NAUSEA MEDICATION *UNUSUAL SHORTNESS OF BREATH *UNUSUAL BRUISING OR BLEEDING *URINARY PROBLEMS (pain or burning when urinating, or frequent urination) *BOWEL PROBLEMS (unusual diarrhea, constipation, pain near the anus) TENDERNESS IN MOUTH AND THROAT WITH OR WITHOUT PRESENCE OF ULCERS (sore throat, sores in mouth, or a toothache) UNUSUAL RASH, SWELLING OR PAIN  UNUSUAL VAGINAL DISCHARGE OR ITCHING   Items with * indicate a potential emergency and should be followed up as soon as possible or go to the Emergency Department if any problems should occur.  Please show the CHEMOTHERAPY ALERT CARD or IMMUNOTHERAPY ALERT CARD at check-in to the Emergency  Department and triage nurse.  Should you have questions after your visit or need to cancel or reschedule your appointment, please contact [facility] [phone]  and follow the prompts.  Office hours are 8:00 a.m. to 4:30 p.m. Monday - Friday. Please note that voicemails left after 4:00 p.m. may not be returned until the following business day.  We are closed weekends and major holidays. You have access to a nurse at all times for urgent questions. Please call the main number to the clinic [phone] and follow the prompts.  For any non-urgent questions, you may also contact your provider using MyChart. We now offer e-Visits for anyone 18 and older to request care online for non-urgent symptoms. For details visit [email or web address].   Also download the MyChart app! Go to the app store, search "MyChart", open the app, select [place], and log in with your MyChart username and password.  Masks are optional in the cancer centers. If you would like for your care team to wear a mask while they are taking care of you, please let them know. You may have one support person who is at least 65 years old accompany you for your appointments.  

## 2022-01-09 NOTE — Progress Notes (Signed)
Patient presents today for Venofer infusion per providers order.  Vital signs WNL.  Patient has no new complaints at this time.  Patient took premedications at home.  Peripheral IV started and blood return noted pre and post infusion.  Venofer given today per MD orders.  Stable during infusion without adverse affects.  Vital signs stable.  No complaints at this time.  Discharge from clinic ambulatory in stable condition.  Alert and oriented X 3.  Follow up with [facility] as scheduled.

## 2022-01-10 ENCOUNTER — Other Ambulatory Visit: Payer: Self-pay | Admitting: Family Medicine

## 2022-01-10 DIAGNOSIS — Z8673 Personal history of transient ischemic attack (TIA), and cerebral infarction without residual deficits: Secondary | ICD-10-CM

## 2022-01-16 ENCOUNTER — Inpatient Hospital Stay: Payer: Medicare HMO

## 2022-01-27 ENCOUNTER — Inpatient Hospital Stay: Payer: Medicare HMO

## 2022-01-27 VITALS — BP 127/74 | HR 66 | Temp 98.3°F | Resp 18

## 2022-01-27 DIAGNOSIS — D500 Iron deficiency anemia secondary to blood loss (chronic): Secondary | ICD-10-CM

## 2022-01-27 MED ORDER — SODIUM CHLORIDE 0.9 % IV SOLN: Freq: Once | INTRAVENOUS | Status: AC

## 2022-01-27 MED ORDER — SODIUM CHLORIDE 0.9 % IV SOLN
300.0000 mg | Freq: Once | INTRAVENOUS | Status: AC
  Administered 2022-01-27: 300 mg via INTRAVENOUS
  Filled 2022-01-27: qty 300

## 2022-01-27 NOTE — Progress Notes (Signed)
Patient took her pre-meds at home.   Venofer infusion given per orders. Patient tolerated it well without problems. Vitals stable and discharged home from clinic ambulatory. Follow up as scheduled.

## 2022-01-27 NOTE — Patient Instructions (Signed)
[facility]  Discharge Instructions: Thank you for choosing [facility] to provide your oncology and hematology care.  If you have a lab appointment with the [facility], please come in thru the Main Entrance and check in at the main information desk.    We strive to give you quality time with your provider. You may need to reschedule your appointment if you arrive late (15 or more minutes).  Arriving late affects you and other patients whose appointments are after yours.  Also, if you miss three or more appointments without notifying the office, you may be dismissed from the clinic at the provider's discretion.      For prescription refill requests, have your pharmacy contact our office and allow 72 hours for refills to be completed.    Today you received venofer '300mg'$    To help prevent nausea and vomiting after your treatment, we encourage you to take your nausea medication as directed.  BELOW ARE SYMPTOMS THAT SHOULD BE REPORTED IMMEDIATELY: *FEVER GREATER THAN 100.4 F (38 C) OR HIGHER *CHILLS OR SWEATING *NAUSEA AND VOMITING THAT IS NOT CONTROLLED WITH YOUR NAUSEA MEDICATION *UNUSUAL SHORTNESS OF BREATH *UNUSUAL BRUISING OR BLEEDING *URINARY PROBLEMS (pain or burning when urinating, or frequent urination) *BOWEL PROBLEMS (unusual diarrhea, constipation, pain near the anus) TENDERNESS IN MOUTH AND THROAT WITH OR WITHOUT PRESENCE OF ULCERS (sore throat, sores in mouth, or a toothache) UNUSUAL RASH, SWELLING OR PAIN  UNUSUAL VAGINAL DISCHARGE OR ITCHING   Items with * indicate a potential emergency and should be followed up as soon as possible or go to the Emergency Department if any problems should occur.  Please show the CHEMOTHERAPY ALERT CARD or IMMUNOTHERAPY ALERT CARD at check-in to the Emergency Department and triage nurse.  Should you have questions after your visit or need to cancel or reschedule your appointment, please contact  [facility] [phone]  and follow the prompts.  Office hours are 8:00 a.m. to 4:30 p.m. Monday - Friday. Please note that voicemails left after 4:00 p.m. may not be returned until the following business day.  We are closed weekends and major holidays. You have access to a nurse at all times for urgent questions. Please call the main number to the clinic [phone] and follow the prompts.  For any non-urgent questions, you may also contact your provider using MyChart. We now offer e-Visits for anyone 30 and older to request care online for non-urgent symptoms. For details visit [email or web address].   Also download the MyChart app! Go to the app store, search "MyChart", open the app, select [facility], and log in with your MyChart username and password.  Masks are optional in the cancer centers. If you would like for your care team to wear a mask while they are taking care of you, please let them know. You may have one support person who is at least 65 years old accompany you for your appointments.

## 2022-02-02 ENCOUNTER — Ambulatory Visit (INDEPENDENT_AMBULATORY_CARE_PROVIDER_SITE_OTHER): Payer: Medicare HMO | Admitting: Internal Medicine

## 2022-02-02 ENCOUNTER — Encounter: Payer: Self-pay | Admitting: Internal Medicine

## 2022-02-02 VITALS — BP 140/82 | HR 75 | Ht 68.0 in | Wt 232.6 lb

## 2022-02-02 DIAGNOSIS — K7030 Alcoholic cirrhosis of liver without ascites: Secondary | ICD-10-CM | POA: Diagnosis not present

## 2022-02-02 DIAGNOSIS — R5383 Other fatigue: Secondary | ICD-10-CM

## 2022-02-02 NOTE — Progress Notes (Signed)
HPI: This is a very pleasant 66 year old woman presents for follow up of EtOH cirrhosis  Interval History: She has felt really tired/fatigued for the last 1-2 weeks. She had some iron infusions recently that have not helped significantly. Denies blood in stools, confusion, or edema. Denies jaundice or scleral icterus. Has not had alcohol for years. Her bowel habits are typically irregular. She has constipation alternating with diarrhea but recently discovered that if she cuts out dairy, her stools become more regular. She has lost a substantial amount of weight deliberately over time.   Wt Readings from Last 3 Encounters:  02/02/22 232 lb 9.6 oz (105.5 kg)  12/18/21 231 lb (104.8 kg)  12/04/21 240 lb (108.9 kg)   Current Outpatient Medications  Medication Instructions   acetaminophen (TYLENOL) 500 mg, Oral, Every 6 hours PRN   aspirin EC 81 mg, Oral, Daily, Swallow whole.   atorvastatin (LIPITOR) 40 MG tablet TAKE ONE TABLET BY MOUTH ONCE DAILY.   benzonatate (TESSALON) 100 mg, Oral, 3 times daily PRN, Do not take with alcohol or while driving or operating heavy machinery   bumetanide (BUMEX) 1 MG tablet TAKE (1) TABLET BY MOUTH TWICE DAILY.   buPROPion (WELLBUTRIN XL) 300 mg, Oral, Daily   Camphor-Menthol-Methyl Sal (SALONPAS) 3.04-04-08 % PTCH 1 patch, Transdermal, Daily PRN   cholecalciferol (VITAMIN D3) 2,000 Units, Oral, Daily   DULoxetine (CYMBALTA) 60 MG capsule 1 capsule   famotidine (PEPCID) 20 mg, Oral, 2 times daily before meals   fluticasone (FLONASE) 50 MCG/ACT nasal spray 1 spray, Each Nare, See admin instructions, Instill 1 spray into each nostril every morning, may also use later in the day as needed for sinus congestion    lamoTRIgine (LAMICTAL) 200 mg, Oral, Daily   LINZESS 145 MCG CAPS capsule TAKE (1) CAPSULE BY MOUTH EVERY DAY BEFORE BREAKFAST.   loratadine (CLARITIN) 10 mg, Daily PRN   losartan (COZAAR) 25 mg, Oral, Daily   Polyvinyl Alcohol-Povidone (REFRESH OP) 1  drop, Both Eyes, 3 times daily PRN   Simethicone (GAS-X PO) 1 tablet, Oral, 2 times daily PRN   sodium chloride (OCEAN) 0.65 % nasal spray 1 spray, Nasal, 2 times daily PRN   spironolactone (ALDACTONE) 100 MG tablet TAKE 1/2 TABLET BY MOUTH TWICE DAILY.   tobramycin (TOBREX) 0.3 % ophthalmic solution SMARTSIG:In Eye(s)   Tofacitinib Citrate ER (XELJANZ XR) 11 MG TB24 1 tablet Orally Once a day for 30 days   XIIDRA 5 % SOLN 1 drop, Ophthalmic, 2 times daily    Physical Exam: BP (!) 140/82   Pulse 75   Ht '5\' 8"'$  (1.727 m)   Wt 232 lb 9.6 oz (105.5 kg)   SpO2 98%   BMI 35.37 kg/m  Constitutional: generally well-appearing Psychiatric: alert and oriented x3 Eyes: extraocular movements intact Mouth: oral pharynx moist, no lesions Neck: supple no lymphadenopathy Cardiovascular: heart regular rate and rhythm Lungs: clear to auscultation bilaterally Abdomen: soft, nontender, nondistended, no obvious ascites, no peritoneal signs, normal bowel sounds Extremities: no lower extremity edema bilaterally Skin: no lesions on visible extremities  Labs 07/2021: INR with mildly elevated INR of 1.1. ANA positive at 1:40 titer. AMA negative. AFP negative. Hep A antibody NR. Hep B surface antibody NR. Hep C antibody NR.   Labs 11/2021: CBC with nml Hb. Ferritin 26. CMP with mildly elevated AST of 41 and elevated Cr of 2.17  RUQ U/S 08/05/21: IMPRESSION: Evidence of hepatic cirrhosis with no suspicious mass identified.  EGD 06/04/20: - Nodule found in  the esophagus. Biopsied. - Gastritis. - Normal duodenal bulb, first portion of the duodenum and second portion of the duodenum. Path: A. GASTROESOPHAGEAL JUNCTION NODULE, BIOPSY:  - Unremarkable gastric cardia mucosa.  - No intestinal metaplasia, dysplasia or carcinoma.   Colonoscopy 08/10/16: - Redundant LEFT colon. - The examination was otherwise normal. - External and internal hemorrhoids.  Assessment and plan: 66 y.o. female with history of EtOH  cirrhosis, Sjogren's syndrome, CKD, IBS, bipolar disorder, CVA presents for follow up of EtOH cirrhosis. She appears to be well-compensated at this time. Her recent CMP only showed a very mildly elevated AST. Will plan to update her [place] screening by performing RUQ U/S. Will also check Vit D and Vitamin B12 levels since patient describes some fatigue.  - Continue low sodium diet and diuretics - Check INR, Vit D, Vitamin B12 - RUQ U/S in [facility]) for [facility] screening - Next EGD due in 05/2023 for variceal screening - Next colonoscopy due in 07/2026 for colon cancer screening - RTC 6 months  Christia Reading, MD [facility] Gastroenterology 02/02/2022, 12:36 PM  I spent 36 minutes of time, including in depth chart review, independent review of results as outlined above, communicating results with the patient directly, face-to-face time with the patient, coordinating care, and ordering studies and medications as appropriate, and documentation.

## 2022-02-02 NOTE — Patient Instructions (Signed)
_______________________________________________________  If you are age 65 or older, your body mass index should be between 23-30. Your Body mass index is 35.37 kg/m. If this is out of the aforementioned range listed, please consider follow up with your Primary Care Provider.  If you are age 22 or younger, your body mass index should be between 19-25. Your Body mass index is 35.37 kg/m. If this is out of the aformentioned range listed, please consider follow up with your Primary Care Provider.   ________________________________________________________  The [facility] providers would like to encourage you to use [facility] to communicate with providers for non-urgent requests or questions.  Due to long hold times on the telephone, sending your provider a message by [facility] may be a faster and more efficient way to get a response.  Please allow 48 business hours for a response.  Please remember that this is for non-urgent requests.  ____________________________________________________You have been scheduled for an abdominal ultrasound at [facility] Radiology (1st floor of hospital) on 02/09/22 at 7:30 am. Please arrive 15 minutes prior to your appointment for registration. Make certain not to have anything to eat or drink 6 hours prior to your appointment. Should you need to reschedule your appointment, please contact radiology at [phone]. This test typically takes about 30 minutes to perform.   Your provider has requested that you go to the basement level for lab work before leaving today. Press "B" on the elevator. The lab is located at the first door on the left as you exit the elevator.  Due to recent changes in healthcare laws, you may see the results of your imaging and laboratory studies on MyChart before your provider has had a chance to review them.  We understand that in some cases there may be results that are confusing or concerning to you. Not all laboratory results come back in the same  time frame and the provider may be waiting for multiple results in order to interpret others.  Please give [place] 48 hours in order for your provider to thoroughly review all the results before contacting the office for clarification of your results.     You will be due for follow up in May 2024 we will contact you to schedule this appointment   Thank you for entrusting me with your care and choosing [facility].  Dr Lorenso Courier

## 2022-02-09 ENCOUNTER — Encounter: Payer: Self-pay | Admitting: Internal Medicine

## 2022-02-09 ENCOUNTER — Ambulatory Visit (HOSPITAL_COMMUNITY)
Admission: RE | Admit: 2022-02-09 | Discharge: 2022-02-09 | Disposition: A | Discharge status: Home / Self Care | Payer: Medicare HMO | Source: Ambulatory Visit | Attending: Internal Medicine | Admitting: Internal Medicine

## 2022-02-09 DIAGNOSIS — K7030 Alcoholic cirrhosis of liver without ascites: Secondary | ICD-10-CM | POA: Diagnosis present

## 2022-02-10 ENCOUNTER — Other Ambulatory Visit: Payer: Self-pay

## 2022-02-10 DIAGNOSIS — K7030 Alcoholic cirrhosis of liver without ascites: Secondary | ICD-10-CM

## 2022-02-11 ENCOUNTER — Other Ambulatory Visit: Payer: Self-pay | Admitting: Family Medicine

## 2022-02-11 DIAGNOSIS — I10 Essential (primary) hypertension: Secondary | ICD-10-CM

## 2022-02-16 ENCOUNTER — Other Ambulatory Visit: Payer: Self-pay | Admitting: Family Medicine

## 2022-02-18 ENCOUNTER — Ambulatory Visit (HOSPITAL_COMMUNITY): Payer: Medicare HMO | Admitting: Physical Therapy

## 2022-02-25 ENCOUNTER — Ambulatory Visit (HOSPITAL_COMMUNITY): Payer: Medicare HMO | Attending: Family Medicine | Admitting: Physical Therapy

## 2022-02-25 DIAGNOSIS — R42 Dizziness and giddiness: Secondary | ICD-10-CM | POA: Diagnosis not present

## 2022-02-25 NOTE — Therapy (Signed)
OUTPATIENT PHYSICAL THERAPY VESTIBULAR EVALUATION     Patient Name: Deanna Thompson MRN: [id number] DOB:1955/10/24, 67 y.o., female Today's Date: 02/25/2022  END OF SESSION:  PT End of Session - 02/25/22 1602     Visit Number 1    Number of Visits 2    Date for PT Re-Evaluation 03/18/22    Authorization Type [employer] Medicare    PT Start Time 8588    PT Stop Time 1600    PT Time Calculation (min) 45 min    Activity Tolerance Patient tolerated treatment well    Behavior During Therapy WFL for tasks assessed/performed             Past Medical History:  Diagnosis Date   Abdominal hernia    repaired   Abscess of pulp of tooth 12/12/2014   left side   Anemia    Anxiety    Arthritis    back  and down flank  right side more than left   B12 deficiency 06/12/2015   Bipolar disorder ([place])    Brain aneurysm    METAL COIL PREVENTS MRI IMAGING and CLIP   Cirrhosis ([place])    Presumably alcoholic cirrhosis   Complication of anesthesia    Depression    ETOH abuse    Quit in 07/2011    Fatigue    due to Sjogren's disease   Flat foot (pes planus) (acquired), left foot    Folate deficiency 06/30/2016   GERD (gastroesophageal reflux disease)    HTN (hypertension)    no longer medicated, dated on 04/25/12   IBS (irritable bowel syndrome)    Iron deficiency anemia due to chronic blood loss    Myofascial pain    Panic attack    PONV (postoperative nausea and vomiting)    Psoriatic arthritis ([place])    Right inguinal hernia 08/16/2012   Seizures ([place]) 1997   1 seizure from coil insertion but no more seizures and no meds for seizures   Sjogren's disease ([place])    Stroke ([place]) 2019   [place] 02/03/2007   Qualifier: Diagnosis of  By: Aline Brochure MD, Dorothyann Peng     TIA (transient ischemic attack)    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([place])    Vertigo    Past Surgical History:  Procedure Laterality Date   AGILE CAPSULE N/A 06/21/2014   Procedure: AGILE CAPSULE;   Surgeon: Danie Binder, MD;  Location: [facility] END[facility];  Service: Endoscopy;  Laterality: N/A;  0700   BIOPSY  10/13/2011   SLF: mild gastritis/duodenal polypoid lesion    BIOPSY  06/04/2020   Procedure: BIOPSY;  Surgeon: Eloise Harman, DO;  Location: [facility] ENDO SUITE;  Service: Endoscopy;;   CEREBRAL ANEURYSM REPAIR  1997   CHOLECYSTECTOMY N/A 10/28/2018   Procedure: LAPAROSCOPIC CHOLECYSTECTOMY WITH INTRAOPERATIVE CHOLANGIOGRAM;  Surgeon: Virl Cagey, MD;  Location: [facility] ORS;  Service: General;  Laterality: N/A;   COLONOSCOPY  10/13/2011   FOY:DXAJOINO hemorrhoids/varices rectal/lesion in ascending colon(HYPERPLASTIC POLYP)   COLONOSCOPY N/A 01/17/2013   SLF: 1. Internal hemorrhoids 2. Ascending colon polyps 3.  Rectal varices.    COLONOSCOPY N/A 08/10/2016   Dr. Oneida Alar: redundant left colon. external and internal hemorhroids. 5-10 year surveillance   ESOPHAGOGASTRODUODENOSCOPY N/A 06/29/2014   SLF: 1. anemia due to polypoid lesions in teh antrum, and duodenal polyps 2. single duodenum AVM   ESOPHAGOGASTRODUODENOSCOPY (EGD) WITH PROPOFOL N/A 03/07/2019   Procedure: ESOPHAGOGASTRODUODENOSCOPY (EGD) WITH PROPOFOL;  Surgeon: Danie Binder, MD;  Location: [facility];  Service: Endoscopy;  Laterality: N/A;  1:00pm   ESOPHAGOGASTRODUODENOSCOPY (EGD) WITH PROPOFOL N/A 03/16/2019   Procedure: ESOPHAGOGASTRODUODENOSCOPY (EGD) WITH PROPOFOL;  Surgeon: Danie Binder, MD;  [facility];  Service: Endoscopy;  Laterality: N/A;   ESOPHAGOGASTRODUODENOSCOPY (EGD) WITH PROPOFOL N/A 06/04/2020   Procedure: ESOPHAGOGASTRODUODENOSCOPY (EGD) WITH PROPOFOL;  Surgeon: Eloise Harman, DO;  [facility];  Service: Endoscopy;  Laterality: N/A;  am   FLEXIBLE SIGMOIDOSCOPY N/A 01/21/2016   Procedure: FLEXIBLE SIGMOIDOSCOPY;  Surgeon: Danie Binder, MD;  [facility];  Service: Endoscopy;  Laterality: N/A;  [place] N/A 06/29/2014   Procedure: GIVENS CAPSULE  STUDY;  Surgeon: Danie Binder, MD;  Location[facility];  Service: Endoscopy;  Laterality: N/A;   GIVENS CAPSULE STUDY N/A 07/13/2014   Procedure: GIVENS CAPSULE STUDY;  Surgeon: Danie Binder, MD;  [facility];  Service: Endoscopy;  Laterality: N/A;  [place] N/A 01/21/2016   Procedure: HEMORRHOID BANDING;  Surgeon: Danie Binder, MD;  [facility];  Service: Endoscopy;  Laterality: N/A;   HEMOSTASIS CLIP PLACEMENT  03/16/2019   Procedure: HEMOSTASIS CLIP PLACEMENT;  Surgeon: Danie Binder, MD;  [facility];  Service: Endoscopy;;  x 4   HEMOSTASIS CONTROL  03/16/2019   Procedure: HEMOSTASIS CONTROL;  Surgeon: Danie Binder, MD;  Location: [facility];  Service: Endoscopy;;  injection with epinephine 0.'1mg'$ /ml via scope and bipolar cautery with gold probe   INGUINAL HERNIA REPAIR Right 08/28/2013   Procedure: RIGHT [place];  Surgeon: Jamesetta So, MD;  Location: [facility] ORS;  Service: General;  Laterality: Right;   INSERTION OF MESH N/A 06/09/2013   Procedure: INSERTION OF MESH;  Surgeon: Jamesetta So, MD;  Location: [facility] ORS;  Service: General;  Laterality: N/A;   INSERTION OF MESH Right 08/28/2013   Procedure: INSERTION OF MESH;  Surgeon: Jamesetta So, MD;  Location: [facility] ORS;  Service: General;  Laterality: Right;   PARACENTESIS  Feb 2014   10.7 liters   TOOTH EXTRACTION Left 1/82/99   UMBILICAL HERNIA REPAIR N/A 06/09/2013   Procedure: UMBILICAL HERNIORRHAPHY WITH MESH;  Surgeon: Jamesetta So, MD;  Location: [facility] ORS;  Service: General;  Laterality: N/A;   Patient Active Problem List   Diagnosis Date Noted   Vertigo 11/10/2021   Incisional hernia, without obstruction or gangrene 11/10/2021   Bipolar disorder ([place]) 01/13/2021   Hyperlipidemia 01/13/2021   Decreased cardiac ejection fraction 01/13/2021   IBS (irritable bowel syndrome)    History of stroke 08/01/2017   Essential hypertension 07/31/2017    GERD (gastroesophageal reflux disease) 07/31/2017   Brain aneurysm 07/31/2017   CKD (chronic kidney disease), stage III ([place]) 07/31/2017   LBBB (left bundle branch block) 07/31/2017   Psoriatic arthritis ([place]) 04/15/2015   Cirrhosis of liver ([place]) 09/19/2014   Iron deficiency anemia due to chronic blood loss    Insomnia 03/17/2014    PCP: Coral Spikes, DO REFERRING PROVIDER: Coral Spikes, DO  REFERRING DIAG: R42 (ICD-10-CM) - Vertigo  THERAPY DIAG:  Dizziness and giddiness  ONSET DATE: 2-3 years ago   Rationale for Evaluation and Treatment: Rehabilitation  SUBJECTIVE:   SUBJECTIVE STATEMENT: Patient presents to therapy with complaint on vertigo. 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 She has been to ENT. She was given self eppley maneuver which she has been  doing with some success. She is doing much better recently and has not had any major attacks lately. She would like more information on these vertigo bouts and have a place to go when she has these attacks.  Pt accompanied by: self  PERTINENT HISTORY: CVA 2019, psoriatic arthritis  PAIN:  Are you having pain? No  PRECAUTIONS: None  WEIGHT BEARING RESTRICTIONS: No  FALLS: Has patient fallen in last 6 months? Yes. Number of falls 1  LIVING ENVIRONMENT: Lives with: lives with their spouse Lives in: House/apartment Stairs: Yes: External: 5 steps; on right going up Has following equipment at home: None  PLOF: Independent  PATIENT GOALS: "Make sure I don't have a serious attack"  OBJECTIVE:   DIAGNOSTIC FINDINGS: NA  COGNITION: Overall cognitive status: Within functional limits for tasks assessed   Cervical ROM:   WFL but notes pain at end range   PATIENT SURVEYS:  FOTO 62%   VESTIBULAR ASSESSMENT:  SYMPTOM BEHAVIOR:  Subjective history: See above  Non-Vestibular symptoms: headaches and nausea/vomiting  Type of dizziness: Spinning/Vertigo  Frequency: varies    Duration: varies   Aggravating factors:  varies, looking up   Relieving factors:  being still, fixating gaze, habituation exercise    Progression of symptoms: better  OCULOMOTOR EXAM:  Ocular Alignment: normal  Ocular ROM: No Limitations  Smooth Pursuits: intact     MOTION SENSITIVITY:  Motion Sensitivity Quotient Intensity: 0 = none, 1 = Lightheaded, 2 = Mild, 3 = Moderate, 4 = Severe, 5 = Vomiting  Intensity  1. Sitting to supine 0  2. Supine to L side 0  3. Supine to R side 0  4. Supine to sitting 2  5. L Hallpike-Dix 0  6. Up from L    7. R Hallpike-Dix 2  8. Up from R    9. Sitting, head tipped to L knee   10. Head up from L knee   11. Sitting, head tipped to R knee   12. Head up from R knee   13. Sitting head turns x5 0  14.Sitting head nods x5 2  15. In stance, 180 turn to L    16. In stance, 180 turn to R     VESTIBULAR TREATMENT:                                                                                                   DATE:  02/25/22 Eval   Canalith Repositioning:  Epley Right: Comment: 5 sec nystagmus  and Epley Left: Comment: no symptom   PATIENT EDUCATION: Education details: on eval findings, POC and HEP  Person educated: Patient Education method: Explanation Education comprehension: verbalized understanding  HOME EXERCISE PROGRAM: Access Code: [email or web address] URL: [email or web address] Date: 02/25/2022 Prepared by: Josue Hector  Exercises - Brandt-Daroff Vestibular Exercise  - 1 x daily - 7 x weekly - 1 sets - 5 reps - Seated Head Nod  - 1 x daily - 7 x weekly - 2 sets - 10 reps  GOALS: SHORT TERM GOALS: Target date: 03/18/2022  Patient will be independent with initial HEP and self-management  strategies to improve functional outcomes Baseline:  Goal status: INITIAL   ASSESSMENT:  CLINICAL IMPRESSION: Patient is a 66 y.o. female who presents to physical therapy with complaint of vertigo. Patient demonstrates  increased vestibular symptoms with provocative testing which is negatively impacting patient ability to perform ADLs and functional mobility tasks. Patient will benefit from skilled physical therapy services to address these deficits to improve level of function with ADLs, functional mobility tasks, and reduce risk for falls.    OBJECTIVE IMPAIRMENTS: Abnormal gait and dizziness.   ACTIVITY LIMITATIONS: transfers and locomotion level  PARTICIPATION LIMITATIONS: cleaning, shopping, community activity, and yard work  PERSONAL FACTORS: Past/current experiences are also affecting patient's functional outcome.   REHAB POTENTIAL: Good  CLINICAL DECISION MAKING: Stable/uncomplicated  EVALUATION COMPLEXITY: Low   PLAN:  PT FREQUENCY: 1x/week  PT DURATION: 3 weeks  PLANNED INTERVENTIONS: Therapeutic exercises, Therapeutic activity, Neuromuscular re-education, Balance training, Gait training, Patient/Family education, Self Care, Joint mobilization, Vestibular training, and Canalith repositioning Therapeutic exercises, Therapeutic activity, Neuromuscular re-education, Balance training, Gait training, Patient/Family education, Joint manipulation, Joint mobilization, Stair training, Aquatic Therapy, Dry Needling, Electrical stimulation, Spinal manipulation, Spinal mobilization, Cryotherapy, Moist heat, scar mobilization, Taping, Traction, Ultrasound, Biofeedback, Ionotophoresis '4mg'$ /ml Dexamethasone, and Manual therapy.  PLAN FOR NEXT SESSION: follow up in 2-3 weeks. Recheck Eppley, if no symptom exacerbation plan to DC   4:04 PM, 02/25/22 Josue Hector PT DPT  Physical Therapist with [facility]  [phone]

## 2022-02-27 ENCOUNTER — Other Ambulatory Visit: Payer: Self-pay | Admitting: Internal Medicine

## 2022-02-27 ENCOUNTER — Other Ambulatory Visit: Payer: Self-pay | Admitting: Gastroenterology

## 2022-02-27 NOTE — Telephone Encounter (Signed)
Pt has not been seen since 2021. Pt needs ov. Made pharmacy aware

## 2022-03-10 ENCOUNTER — Telehealth: Payer: Self-pay | Admitting: Family Medicine

## 2022-03-10 NOTE — Telephone Encounter (Signed)
  Left message for patient to call back and schedule Medicare Annual Wellness Visit (AWV) to be completed by video or phone.  No hx of AWV eligible for AWVI per [facility] as of 11/28/2020  Please schedule at anytime with [place]   45  Minutes appointment   Any questions, please call me at [phone]

## 2022-03-16 ENCOUNTER — Ambulatory Visit (INDEPENDENT_AMBULATORY_CARE_PROVIDER_SITE_OTHER): Payer: Medicare HMO | Admitting: Family Medicine

## 2022-03-16 ENCOUNTER — Encounter: Payer: Self-pay | Admitting: Family Medicine

## 2022-03-16 VITALS — BP 136/80 | HR 85 | Temp 98.3°F | Wt 230.8 lb

## 2022-03-16 DIAGNOSIS — R059 Cough, unspecified: Secondary | ICD-10-CM | POA: Insufficient documentation

## 2022-03-16 DIAGNOSIS — I5043 Acute on chronic combined systolic (congestive) and diastolic (congestive) heart failure: Secondary | ICD-10-CM | POA: Insufficient documentation

## 2022-03-16 DIAGNOSIS — K432 Incisional hernia without obstruction or gangrene: Secondary | ICD-10-CM | POA: Diagnosis not present

## 2022-03-16 DIAGNOSIS — R051 Acute cough: Secondary | ICD-10-CM | POA: Diagnosis not present

## 2022-03-16 MED ORDER — BENZONATATE 200 MG PO CAPS: 200.0000 mg | ORAL_CAPSULE | Freq: Three times a day (TID) | ORAL | 3 refills | Status: DC | PRN

## 2022-03-16 NOTE — Progress Notes (Unsigned)
Deanna Thompson, [place] [address]   CLINIC:  Medical Oncology/Hematology  PCP:  Coral Spikes, DO [place] [address] [phone]   REASON FOR VISIT:  Follow-up for iron deficiency anemia   CURRENT THERAPY: Intermittent IV iron infusions   INTERVAL HISTORY:  [name] 66 y.o. female returns for routine follow-up of her iron deficiency anemia.  She was last evaluated by Tarri Abernethy PA-C via telemedicine visit on 12/18/2021.   Her last IV iron was with Venofer 300 mg x 3 doses from 01/02/2022 through 01/27/2022. *** She has noticed some recurrent fatigue over the past few months, states that she is wearing out easier. *** She denies any pica, restless legs, headaches, chest pain, dyspnea on exertion, lightheadedness, or syncope. *** She has a history of bleeding AVMs, but has not had any recent hematemesis, hematochezia, or melena. *** She has ***% energy and ***% appetite. She endorses that she is maintaining a stable weight.   REVIEW OF SYSTEMS: *** Review of Systems - Oncology    PAST MEDICAL/SURGICAL HISTORY:  Past Medical History:  Diagnosis Date   Abdominal hernia    repaired   Abscess of pulp of tooth 12/12/2014   left side   Anemia    Anxiety    Arthritis    back  and down flank  right side more than left   B12 deficiency 06/12/2015   Bipolar disorder ([place])    Brain aneurysm    METAL COIL PREVENTS MRI IMAGING and CLIP   Cirrhosis ([place])    Presumably alcoholic cirrhosis   Complication of anesthesia    Depression    ETOH abuse    Quit in 07/2011    Fatigue    due to Sjogren's disease   Flat foot (pes planus) (acquired), left foot    Folate deficiency 06/30/2016   GERD (gastroesophageal reflux disease)    HTN (hypertension)    no longer medicated, dated on 04/25/12   IBS (irritable bowel syndrome)    Iron deficiency anemia due to chronic blood loss    Myofascial pain    Panic attack    PONV  (postoperative nausea and vomiting)    Psoriatic arthritis ([place])    Right inguinal hernia 08/16/2012   Seizures ([place]) 1997   1 seizure from coil insertion but no more seizures and no meds for seizures   Sjogren's disease ([place])    Stroke ([place]) 2019   [place] 02/03/2007   Qualifier: Diagnosis of  By: Aline Brochure MD, Dorothyann Peng     TIA (transient ischemic attack)    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([place])    Vertigo    Past Surgical History:  Procedure Laterality Date   AGILE CAPSULE N/A 06/21/2014   Procedure: AGILE CAPSULE;  Surgeon: Danie Binder, MD;  [facility];  Service: Endoscopy;  Laterality: N/A;  0700   BIOPSY  10/13/2011   SLF: mild gastritis/duodenal polypoid lesion    BIOPSY  06/04/2020   Procedure: BIOPSY;  Surgeon: Eloise Harman, DO;  Location: [facility];  Service: Endoscopy;;   CEREBRAL ANEURYSM REPAIR  1997   CHOLECYSTECTOMY N/A 10/28/2018   Procedure: LAPAROSCOPIC CHOLECYSTECTOMY WITH INTRAOPERATIVE CHOLANGIOGRAM;  Surgeon: Virl Cagey, MD;  Location: [facility];  Service: General;  Laterality: N/A;   COLONOSCOPY  10/13/2011   DEY:CXKGYJEH hemorrhoids/varices rectal/lesion in ascending colon(HYPERPLASTIC POLYP)   COLONOSCOPY N/A 01/17/2013   SLF: 1. Internal hemorrhoids 2. Ascending colon  polyps 3.  Rectal varices.    COLONOSCOPY N/A 08/10/2016   Dr. Oneida Alar: redundant left colon. external and internal hemorhroids. 5-10 year surveillance   ESOPHAGOGASTRODUODENOSCOPY N/A 06/29/2014   SLF: 1. anemia due to polypoid lesions in teh antrum, and duodenal polyps 2. single duodenum AVM   ESOPHAGOGASTRODUODENOSCOPY (EGD) WITH PROPOFOL N/A 03/07/2019   Procedure: ESOPHAGOGASTRODUODENOSCOPY (EGD) WITH PROPOFOL;  Surgeon: Danie Binder, MD;  [facility];  Service: Endoscopy;  Laterality: N/A;  1:00pm   ESOPHAGOGASTRODUODENOSCOPY (EGD) WITH PROPOFOL N/A 03/16/2019   Procedure: ESOPHAGOGASTRODUODENOSCOPY (EGD) WITH PROPOFOL;  Surgeon:  Danie Binder, MD;  [facility];  Service: Endoscopy;  Laterality: N/A;   ESOPHAGOGASTRODUODENOSCOPY (EGD) WITH PROPOFOL N/A 06/04/2020   Procedure: ESOPHAGOGASTRODUODENOSCOPY (EGD) WITH PROPOFOL;  Surgeon: Eloise Harman, DO;  [facility];  Service: Endoscopy;  Laterality: N/A;  am   FLEXIBLE SIGMOIDOSCOPY N/A 01/21/2016   Procedure: FLEXIBLE SIGMOIDOSCOPY;  Surgeon: Danie Binder, MD;  [facility];  Service: Endoscopy;  Laterality: N/A;  [place] N/A 06/29/2014   Procedure: GIVENS CAPSULE STUDY;  Surgeon: Danie Binder, MD;  [facility];  Service: Endoscopy;  Laterality: N/A;   GIVENS CAPSULE STUDY N/A 07/13/2014   Procedure: GIVENS CAPSULE STUDY;  Surgeon: Danie Binder, MD;  [facility];  Service: Endoscopy;  Laterality: N/A;  [place] N/A 01/21/2016   Procedure: HEMORRHOID BANDING;  Surgeon: Danie Binder, MD;  [facility];  Service: Endoscopy;  Laterality: N/A;   HEMOSTASIS CLIP PLACEMENT  03/16/2019   Procedure: HEMOSTASIS CLIP PLACEMENT;  Surgeon: Danie Binder, MD;  [facility];  Service: Endoscopy;;  x 4   HEMOSTASIS CONTROL  03/16/2019   Procedure: HEMOSTASIS CONTROL;  Surgeon: Danie Binder, MD;  [facility];  Service: Endoscopy;;  injection with epinephine 0.'1mg'$ /ml via scope and bipolar cautery with gold probe   INGUINAL HERNIA REPAIR Right 08/28/2013   Procedure: RIGHT [place];  Surgeon: Jamesetta So, MD;  Location: [facility];  Service: General;  Laterality: Right;   INSERTION OF MESH N/A 06/09/2013   Procedure: INSERTION OF MESH;  Surgeon: Jamesetta So, MD;  Location: [facility];  Service: General;  Laterality: N/A;   INSERTION OF MESH Right 08/28/2013   Procedure: INSERTION OF MESH;  Surgeon: Jamesetta So, MD;  Location: [facility];  Service: General;  Laterality: Right;   PARACENTESIS  Feb 2014   10.7 liters   TOOTH EXTRACTION  Left 5/74/73   UMBILICAL HERNIA REPAIR N/A 06/09/2013   Procedure: UMBILICAL HERNIORRHAPHY WITH MESH;  Surgeon: Jamesetta So, MD;  Location: [facility];  Service: General;  Laterality: N/A;     SOCIAL HISTORY:  Social History   Socioeconomic History   Marital status: Married    Spouse name: Management consultant   Number of children: 0   Years of education: Not on file   Highest education level: Not on file  Occupational History    Employer: RETIRED  Tobacco Use   Smoking status: Former    Packs/day: 2.00    Years: 12.00    Total pack years: 24.00    Types: Cigarettes    Quit date: 03/31/1995    Years since quitting: 26.9   Smokeless tobacco: Never   Tobacco comments:    quit in 1997  Vaping Use   Vaping Use: Never used  Substance and Sexual Activity  Alcohol use: No    Comment: Bottle of wine a day for years, but quit in 07/2011   Drug use: No   Sexual activity: Not Currently    Birth control/protection: Post-menopausal  Other Topics Concern   Not on file  Social History Narrative   Lives   Caffeine use:    Quit drinking alcohol 3 YEARS ago. No drugs, no IVDU.    Social Determinants of Health   Financial Resource Strain: Not on file  Food Insecurity: Not on file  Transportation Needs: Not on file  Physical Activity: Not on file  Stress: Not on file  Social Connections: Not on file  Intimate Partner Violence: Not on file    FAMILY HISTORY:  Family History  Problem Relation Age of Onset   Cirrhosis Mother        nash   COPD Father    Heart disease Father    Asthma Other    Heart disease Other    Colon cancer Neg Hx    Inflammatory bowel disease Neg Hx    Stomach cancer Neg Hx    Esophageal cancer Neg Hx     CURRENT MEDICATIONS:  Outpatient Encounter Medications as of 03/17/2022  Medication Sig   acetaminophen (TYLENOL) 500 MG tablet Take 500 mg by mouth every 6 (six) hours as needed.   aspirin EC 81 MG tablet Take 81 mg by mouth daily. Swallow whole.    atorvastatin (LIPITOR) 40 MG tablet TAKE ONE TABLET BY MOUTH ONCE DAILY.   benzonatate (TESSALON) 200 MG capsule Take 1 capsule (200 mg total) by mouth 3 (three) times daily as needed for cough.   bumetanide (BUMEX) 1 MG tablet TAKE (1) TABLET BY MOUTH TWICE DAILY.   Camphor-Menthol-Methyl Sal (SALONPAS) 3.04-04-08 % PTCH Place 1 patch onto the skin daily as needed (pain.).   cholecalciferol (VITAMIN D3) 25 MCG (1000 UT) tablet Take 2 tablets (2,000 Units total) by mouth daily. (Patient taking differently: Take 1,000 Units by mouth every morning.)   DULoxetine (CYMBALTA) 60 MG capsule 1 capsule   famotidine (PEPCID) 20 MG tablet Take 1 tablet (20 mg total) by mouth 2 (two) times daily before a meal.   fluticasone (FLONASE) 50 MCG/ACT nasal spray Place 1 spray into both nostrils See admin instructions. Instill 1 spray into each nostril every morning, may also use later in the day as needed for sinus congestion   lamoTRIgine (LAMICTAL) 200 MG tablet Take 200 mg by mouth daily.   LINZESS 145 MCG CAPS capsule TAKE (1) CAPSULE BY MOUTH EVERY DAY BEFORE BREAKFAST.   Polyvinyl Alcohol-Povidone (REFRESH OP) Place 1 drop into both eyes 3 (three) times daily as needed (dry eyes).   Simethicone (GAS-X PO) Take 1 tablet by mouth 2 (two) times daily as needed (bloating/flatulence).   sodium chloride (OCEAN) 0.65 % nasal spray Place 1 spray into the nose 2 (two) times daily as needed for congestion.   spironolactone (ALDACTONE) 100 MG tablet TAKE 1/2 TABLET BY MOUTH TWICE DAILY.   tobramycin (TOBREX) 0.3 % ophthalmic solution SMARTSIG:In Eye(s)   Tofacitinib Citrate ER (XELJANZ XR) 11 MG TB24 1 tablet Orally Once a day for 30 days   XIIDRA 5 % SOLN Apply 1 drop to eye 2 (two) times daily.   No facility-administered encounter medications on file as of 03/17/2022.    ALLERGIES:  Allergies  Allergen Reactions   Omeprazole Shortness Of Breath   Orencia [Abatacept] Anaphylaxis, Hives and Rash   Other Shortness  Of Breath, Rash  and Other (See Comments)    Red Meat Causes shortness of breath, rash and flu like symptoms.    Phenytoin Rash    Dilantin  Other reaction(s): rash   Keflex [Cephalexin] Other (See Comments)    Severe yeast infection   Lidocaine Viscous Hcl     NAUSEA/PROLONGED NUMBNESS   Meat Extract     Other reaction(s): nauses, chills   Promethazine Hcl Other (See Comments)    Not in right state of mind, HALLUCINATION Other reaction(s): mental confusion     PHYSICAL EXAM:  ECOG PERFORMANCE STATUS: {CHL ONC ECOG HR:[id number]} *** There were no vitals filed for this visit. There were no vitals filed for this visit. Physical Exam   LABORATORY DATA:  I have reviewed the labs as listed.  CBC    Component Value Date/Time   WBC 6.3 12/16/2021 1030   RBC 4.19 12/16/2021 1030   HGB 12.6 12/16/2021 1030   HGB 11.1 01/14/2021 1155   HCT 38.3 12/16/2021 1030   HCT 33.5 (L) 01/14/2021 1155   HCT 41 07/30/2011 0743   PLT 332 12/16/2021 1030   PLT 425 01/14/2021 1155   MCV 91.4 12/16/2021 1030   MCV 82 01/14/2021 1155   MCV 104.3 07/30/2011 0743   MCH 30.1 12/16/2021 1030   MCHC 32.9 12/16/2021 1030   RDW 15.1 12/16/2021 1030   RDW 15.5 (H) 01/14/2021 1155   LYMPHSABS 0.7 12/16/2021 1030   LYMPHSABS 1.3 03/27/2019 0854   MONOABS 0.6 12/16/2021 1030   EOSABS 0.3 12/16/2021 1030   EOSABS 0.2 03/27/2019 0854   BASOSABS 0.0 12/16/2021 1030   BASOSABS 0.1 03/27/2019 0854      Latest Ref Rng & Units 07/30/2021   11:23 AM 01/14/2021   11:55 AM 12/29/2019   11:13 AM  CMP  Glucose 70 - 99 mg/dL 100  97  139   BUN 6 - 23 mg/dL '16  14  19   '$ Creatinine 0.40 - 1.20 mg/dL 1.40  1.24  1.21   Sodium 135 - 145 mEq/L 138  139  138   Potassium 3.5 - 5.1 mEq/L 3.7  4.4  3.9   Chloride 96 - 112 mEq/L 103  96  104   CO2 19 - 32 mEq/L '23  24  24   '$ Calcium 8.4 - 10.5 mg/dL 10.0  9.7  9.7   Total Protein 6.0 - 8.3 g/dL 8.2  7.1  7.8   Total Bilirubin 0.2 - 1.2 mg/dL 0.6  0.4  0.7    Alkaline Phos 39 - 117 U/L 108  90  85   AST 0 - 37 U/L '26  30  26   '$ ALT 0 - 35 U/L '25  24  25     '$ DIAGNOSTIC IMAGING:  I have independently reviewed the relevant imaging and discussed with the patient.  ASSESSMENT & PLAN: 1.  Iron deficiency anemia due to chronic blood loss - Due to chronic GI blood loss. - Patient was hospitalized in December 2020 for an AVM bleed where her hemoglobin dropped to 7.7. 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  Injected.  Treated with bipolar cautery.  Clips were placed. - She has been transfused RBCs in the past - She has been referred to gastroenterology, and previously followed with Dr. Oneida Alar - She last received IV Venofer x3 on 01/02/2022 through 01/27/2022 - No bright red blood per rectum or melena*** - Symptomatic with  recurrent fatigue*** - Most recent labs (03/17/2022):*** - PLAN: Recommend additional IV iron with Venofer 300 mg x 3. (We will not premedicate - although patient has multiple medication allergies listed, she has previously tolerated Venofer well without premedication.)*** --  Repeat anemia panel and RTC with PHONE visit in 3 months.    2.  Other history - Her other pertinent medical history includes CKD stage IIIa,  liver cirrhosis, history of cerebral aneurysm with stroke, hyperlipidemia, and hypertension    PLAN SUMMARY: >> *** >> *** >> ***   All questions were answered. The patient knows to call the clinic with any problems, questions or concerns.  Medical decision making: ***  Time spent on visit: I spent *** minutes counseling the patient face to face. The total time spent in the appointment was *** minutes and more than 50% was on counseling.   Harriett Rush, PA-C  ***

## 2022-03-16 NOTE — Assessment & Plan Note (Signed)
Acute cough.  Lungs clear.  Tessalon Perles as prescribed.

## 2022-03-16 NOTE — Patient Instructions (Signed)
Tessalon Perles as prescribed.  I have placed a referral to [facility].  Follow up early next regarding your preventative health items.  Take care  Dr. Lacinda Axon

## 2022-03-16 NOTE — Progress Notes (Signed)
Subjective:  Patient ID: Deanna Thompson, female    DOB: [birth year]  Age: 66 y.o. MRN: [id number]  CC: Chief Complaint  Patient presents with   Cough   Hernia    HPI:  66 year old female with an extensive past medical history presents for evaluation of the above.  Patient reports that her husband is undergoing chemotherapy.  She has had a cough for the past 2 weeks.  Cough is productive.  She is concerned given the fact that her husband is undergoing chemotherapy.  She has had no fever.  She is otherwise feeling well.  Patient also reports that she was unable to see surgeon regarding her incisional hernia.  She would like to discuss seeing another Psychologist, sport and exercise.  She states that she feels like it is getting bigger.   Patient Active Problem List   Diagnosis Date Noted   Acute on chronic combined systolic and diastolic CHF, NYHA class 1 ([place]) 03/16/2022   Cough 03/16/2022   Incisional hernia, without obstruction or gangrene 11/10/2021   Bipolar disorder ([place]) 01/13/2021   Hyperlipidemia 01/13/2021   IBS (irritable bowel syndrome)    History of stroke 08/01/2017   Essential hypertension 07/31/2017   GERD (gastroesophageal reflux disease) 07/31/2017   Brain aneurysm 07/31/2017   CKD (chronic kidney disease), stage III ([place]) 07/31/2017   LBBB (left bundle branch block) 07/31/2017   Psoriatic arthritis ([place]) 04/15/2015   Cirrhosis of liver ([place]) 09/19/2014   Iron deficiency anemia due to chronic blood loss    Insomnia 03/17/2014    Social Hx   Social History   Socioeconomic History   Marital status: Married    Spouse name: Management consultant   Number of children: 0   Years of education: Not on file   Highest education level: Not on file  Occupational History    Employer: RETIRED  Tobacco Use   Smoking status: Former    Packs/day: 2.00    Years: 12.00    Total pack years: 24.00    Types: Cigarettes    Quit date: 03/31/1995    Years since quitting: 26.9   Smokeless tobacco: Never    Tobacco comments:    quit in 1997  Vaping Use   Vaping Use: Never used  Substance and Sexual Activity   Alcohol use: No    Comment: Bottle of wine a day for years, but quit in 07/2011   Drug use: No   Sexual activity: Not Currently    Birth control/protection: Post-menopausal  Other Topics Concern   Not on file  Social History Narrative   Lives   Caffeine use:    Quit drinking alcohol 3 YEARS ago. No drugs, no IVDU.    Social Determinants of Health   Financial Resource Strain: Not on file  Food Insecurity: Not on file  Transportation Needs: Not on file  Physical Activity: Not on file  Stress: Not on file  Social Connections: Not on file    Review of Systems Per HPI  Objective:  BP 136/80   Pulse 85   Temp 98.3 F (36.8 C) (Oral)   Wt 230 lb 12.8 oz (104.7 kg)   SpO2 98%   BMI 35.09 kg/m      03/16/2022    1:23 PM 02/02/2022   11:16 AM 01/27/2022    3:49 PM  BP/Weight  Systolic BP 694 854 627  Diastolic BP 80 82 74  Wt. (Lbs) 230.8 232.6   BMI 35.09 kg/m2 35.37 kg/m2     Physical Exam Constitutional:  General: She is not in acute distress.    Appearance: Normal appearance. She is obese.  HENT:     Head: Normocephalic and atraumatic.  Cardiovascular:     Rate and Rhythm: Normal rate and regular rhythm.  Pulmonary:     Effort: Pulmonary effort is normal.     Breath sounds: Normal breath sounds. No wheezing, rhonchi or rales.  Abdominal:     General: There is no distension.     Palpations: Abdomen is soft.     Tenderness: There is no abdominal tenderness.     Comments: Upper abdomen -prior incision noted.  With increase in intra-abdominal pressure by cough or crunching the abdomen, hernia is appreciated.  Neurological:     Mental Status: She is alert.     Lab Results  Component Value Date   WBC 6.3 12/16/2021   HGB 12.6 12/16/2021   HCT 38.3 12/16/2021   PLT 332 12/16/2021   GLUCOSE 100 (H) 07/30/2021   CHOL 160 01/14/2021   TRIG 126  01/14/2021   HDL 79 01/14/2021   LDLCALC 59 01/14/2021   ALT 25 07/30/2021   AST 26 07/30/2021   NA 138 07/30/2021   K 3.7 07/30/2021   CL 103 07/30/2021   CREATININE 1.40 (H) 07/30/2021   BUN 16 07/30/2021   CO2 23 07/30/2021   TSH 8.256 (H) 08/25/2011   INR 1.1 (H) 07/30/2021   HGBA1C 5.8 (H) 08/01/2017     Assessment & Plan:   Problem List Items Addressed This Visit       Other   Cough    Acute cough.  Lungs clear.  Tessalon Perles as prescribed.      Incisional hernia, without obstruction or gangrene - Primary    Referral placed to [facility], Dr. Rosendo Gros.      Relevant Orders   Ambulatory referral to General Surgery    Meds ordered this encounter  Medications   benzonatate (TESSALON) 200 MG capsule    Sig: Take 1 capsule (200 mg total) by mouth 3 (three) times daily as needed for cough.    Dispense:  30 capsule    Refill:  3    Follow-up:  Return in about 3 months (around 06/15/2022).  [place]

## 2022-03-16 NOTE — Assessment & Plan Note (Signed)
Referral placed to [facility], Dr. Rosendo Gros.

## 2022-03-17 ENCOUNTER — Inpatient Hospital Stay: Payer: Medicare HMO | Attending: Hematology

## 2022-03-17 ENCOUNTER — Other Ambulatory Visit: Payer: Self-pay

## 2022-03-17 ENCOUNTER — Inpatient Hospital Stay (HOSPITAL_BASED_OUTPATIENT_CLINIC_OR_DEPARTMENT_OTHER): Payer: Medicare HMO | Admitting: Physician Assistant

## 2022-03-17 VITALS — BP 119/80 | HR 83 | Temp 97.7°F | Resp 18 | Ht 68.0 in | Wt 228.8 lb

## 2022-03-17 DIAGNOSIS — Z881 Allergy status to other antibiotic agents status: Secondary | ICD-10-CM | POA: Diagnosis not present

## 2022-03-17 DIAGNOSIS — Z8249 Family history of ischemic heart disease and other diseases of the circulatory system: Secondary | ICD-10-CM | POA: Insufficient documentation

## 2022-03-17 DIAGNOSIS — K7030 Alcoholic cirrhosis of liver without ascites: Secondary | ICD-10-CM | POA: Insufficient documentation

## 2022-03-17 DIAGNOSIS — Z8379 Family history of other diseases of the digestive system: Secondary | ICD-10-CM | POA: Insufficient documentation

## 2022-03-17 DIAGNOSIS — D500 Iron deficiency anemia secondary to blood loss (chronic): Secondary | ICD-10-CM

## 2022-03-17 DIAGNOSIS — Z825 Family history of asthma and other chronic lower respiratory diseases: Secondary | ICD-10-CM | POA: Insufficient documentation

## 2022-03-17 DIAGNOSIS — R059 Cough, unspecified: Secondary | ICD-10-CM | POA: Insufficient documentation

## 2022-03-17 DIAGNOSIS — Z8673 Personal history of transient ischemic attack (TIA), and cerebral infarction without residual deficits: Secondary | ICD-10-CM | POA: Insufficient documentation

## 2022-03-17 DIAGNOSIS — Z9049 Acquired absence of other specified parts of digestive tract: Secondary | ICD-10-CM | POA: Diagnosis not present

## 2022-03-17 DIAGNOSIS — Z79899 Other long term (current) drug therapy: Secondary | ICD-10-CM | POA: Insufficient documentation

## 2022-03-17 DIAGNOSIS — K5900 Constipation, unspecified: Secondary | ICD-10-CM | POA: Insufficient documentation

## 2022-03-17 DIAGNOSIS — Z8719 Personal history of other diseases of the digestive system: Secondary | ICD-10-CM | POA: Insufficient documentation

## 2022-03-17 DIAGNOSIS — I10 Essential (primary) hypertension: Secondary | ICD-10-CM | POA: Insufficient documentation

## 2022-03-17 DIAGNOSIS — F32A Depression, unspecified: Secondary | ICD-10-CM | POA: Diagnosis not present

## 2022-03-17 DIAGNOSIS — K31811 Angiodysplasia of stomach and duodenum with bleeding: Secondary | ICD-10-CM | POA: Insufficient documentation

## 2022-03-17 DIAGNOSIS — R197 Diarrhea, unspecified: Secondary | ICD-10-CM | POA: Diagnosis not present

## 2022-03-17 DIAGNOSIS — R5383 Other fatigue: Secondary | ICD-10-CM | POA: Diagnosis not present

## 2022-03-17 LAB — CBC WITH DIFFERENTIAL/PLATELET
Abs Immature Granulocytes: 0.03 10*3/uL (ref 0.00–0.07)
Basophils Absolute: 0 10*3/uL (ref 0.0–0.1)
Basophils Relative: 1 %
Eosinophils Absolute: 0.3 10*3/uL (ref 0.0–0.5)
Eosinophils Relative: 5 %
HCT: 41.5 % (ref 36.0–46.0)
Hemoglobin: 13.4 g/dL (ref 12.0–15.0)
Immature Granulocytes: 1 %
Lymphocytes Relative: 13 %
Lymphs Abs: 0.8 10*3/uL (ref 0.7–4.0)
MCH: 30.5 pg (ref 26.0–34.0)
MCHC: 32.3 g/dL (ref 30.0–36.0)
MCV: 94.3 fL (ref 80.0–100.0)
Monocytes Absolute: 0.5 10*3/uL (ref 0.1–1.0)
Monocytes Relative: 9 %
Neutro Abs: 4.5 10*3/uL (ref 1.7–7.7)
Neutrophils Relative %: 71 %
Platelets: 269 10*3/uL (ref 150–400)
RBC: 4.4 MIL/uL (ref 3.87–5.11)
RDW: 15.6 % — ABNORMAL HIGH (ref 11.5–15.5)
WBC: 6.2 10*3/uL (ref 4.0–10.5)
nRBC: 0 % (ref 0.0–0.2)

## 2022-03-17 LAB — IRON AND TIBC
Iron: 65 ug/dL (ref 28–170)
Saturation Ratios: 15 % (ref 10.4–31.8)
TIBC: 434 ug/dL (ref 250–450)
UIBC: 369 ug/dL

## 2022-03-17 LAB — FERRITIN: Ferritin: 58 ng/mL (ref 11–307)

## 2022-03-17 NOTE — Patient Instructions (Signed)
[place] at [place] **   You were seen today by Tarri Abernethy PA-C for your iron deficiency anemia.   Your blood levels look great, you are not anemic at this time.  However, your iron levels are lower than they should be. You may have some small amounts of ongoing blood loss from your stomach and intestines. Your low iron levels may also be causing your current symptoms of fatigue. Will schedule you for IV iron x 3 doses.  LABS: Return in 3 months for repeat labs  FOLLOW-UP APPOINTMENT: Phone visit in 3 months, after labs  ** Thank you for trusting me with your healthcare!  I strive to provide all of my patients with quality care at each visit.  If you receive a survey for this visit, I would be so grateful to you for taking the time to provide feedback.  Thank you in advance!  ~ Keland Peyton                   Dr. Derek Jack   &   Tarri Abernethy, PA-C   - - - - - - - - - - - - - - - - - -    Thank you for choosing [facility] at [facility] to provide your oncology and hematology care.  To afford each patient quality time with our provider, please arrive at least 15 minutes before your scheduled appointment time.   If you have a lab appointment with the [facility] please come in thru the Main Entrance and check in at the main information desk.  You need to re-schedule your appointment should you arrive 10 or more minutes late.  We strive to give you quality time with our providers, and arriving late affects you and other patients whose appointments are after yours.  Also, if you no show three or more times for appointments you may be dismissed from the clinic at the providers discretion.     Again, thank you for choosing [facility].  Our hope is that these requests will decrease the amount of time that you wait before being seen by our physicians.        _____________________________________________________________  Should you have questions after your visit to [facility], please contact our office at [phone] and follow the prompts.  Our office hours are 8:00 a.m. and 4:30 p.m. Monday - Friday.  Please note that voicemails left after 4:00 p.m. may not be returned until the following business day.  We are closed weekends and major holidays.  You do have access to a nurse 24-7, just call the main number to the clinic [phone] and do not press any options, hold on the line and a nurse will answer the phone.    For prescription refill requests, have your pharmacy contact our office and allow 72 hours.

## 2022-03-18 ENCOUNTER — Ambulatory Visit (HOSPITAL_COMMUNITY): Payer: Medicare HMO | Admitting: Physical Therapy

## 2022-03-24 ENCOUNTER — Other Ambulatory Visit: Payer: Self-pay | Admitting: Family Medicine

## 2022-03-31 ENCOUNTER — Other Ambulatory Visit (HOSPITAL_COMMUNITY): Payer: Self-pay | Admitting: Addiction Medicine

## 2022-03-31 ENCOUNTER — Other Ambulatory Visit (HOSPITAL_COMMUNITY): Payer: Self-pay | Admitting: Obstetrics & Gynecology

## 2022-03-31 DIAGNOSIS — Z1231 Encounter for screening mammogram for malignant neoplasm of breast: Secondary | ICD-10-CM

## 2022-04-15 ENCOUNTER — Ambulatory Visit (HOSPITAL_COMMUNITY)
Admission: RE | Admit: 2022-04-15 | Discharge: 2022-04-15 | Disposition: A | Discharge status: Home / Self Care | Payer: Medicare HMO | Source: Ambulatory Visit | Attending: Obstetrics & Gynecology | Admitting: Obstetrics & Gynecology

## 2022-04-15 DIAGNOSIS — Z1231 Encounter for screening mammogram for malignant neoplasm of breast: Secondary | ICD-10-CM | POA: Diagnosis not present

## 2022-04-17 ENCOUNTER — Inpatient Hospital Stay: Payer: Medicare HMO | Attending: Hematology

## 2022-04-17 VITALS — BP 104/64 | HR 70 | Temp 98.0°F | Resp 18

## 2022-04-17 DIAGNOSIS — Z79899 Other long term (current) drug therapy: Secondary | ICD-10-CM | POA: Diagnosis not present

## 2022-04-17 DIAGNOSIS — D500 Iron deficiency anemia secondary to blood loss (chronic): Secondary | ICD-10-CM | POA: Insufficient documentation

## 2022-04-17 DIAGNOSIS — K31811 Angiodysplasia of stomach and duodenum with bleeding: Secondary | ICD-10-CM | POA: Insufficient documentation

## 2022-04-17 MED ORDER — CETIRIZINE HCL 10 MG PO TABS
10.0000 mg | ORAL_TABLET | Freq: Once | ORAL | Status: AC
  Administered 2022-04-17: 10 mg via ORAL
  Filled 2022-04-17: qty 1

## 2022-04-17 MED ORDER — SODIUM CHLORIDE 0.9 % IV SOLN
300.0000 mg | Freq: Once | INTRAVENOUS | Status: AC
  Administered 2022-04-17: 300 mg via INTRAVENOUS
  Filled 2022-04-17: qty 300

## 2022-04-17 MED ORDER — ACETAMINOPHEN 325 MG PO TABS
650.0000 mg | ORAL_TABLET | Freq: Once | ORAL | Status: AC
  Administered 2022-04-17: 650 mg via ORAL
  Filled 2022-04-17: qty 2

## 2022-04-17 MED ORDER — SODIUM CHLORIDE 0.9 % IV SOLN: Freq: Once | INTRAVENOUS | Status: AC

## 2022-04-17 NOTE — Patient Instructions (Signed)
[facility] [place]  Discharge Instructions: Thank you for choosing [facility] to provide your oncology and hematology care.  If you have a lab appointment with the Cancer Center, please come in thru the Main Entrance and check in at the main information desk.  Wear comfortable clothing and clothing appropriate for easy access to any Portacath or PICC line.   We strive to give you quality time with your provider. You may need to reschedule your appointment if you arrive late (15 or more minutes).  Arriving late affects you and other patients whose appointments are after yours.  Also, if you miss three or more appointments without notifying the office, you may be dismissed from the clinic at the provider's discretion.      For prescription refill requests, have your pharmacy contact our office and allow 72 hours for refills to be completed.    Today you received Feraheme IV iron.     BELOW ARE SYMPTOMS THAT SHOULD BE REPORTED IMMEDIATELY: *FEVER GREATER THAN 100.4 F (38 C) OR HIGHER *CHILLS OR SWEATING *NAUSEA AND VOMITING THAT IS NOT CONTROLLED WITH YOUR NAUSEA MEDICATION *UNUSUAL SHORTNESS OF BREATH *UNUSUAL BRUISING OR BLEEDING *URINARY PROBLEMS (pain or burning when urinating, or frequent urination) *BOWEL PROBLEMS (unusual diarrhea, constipation, pain near the anus) TENDERNESS IN MOUTH AND THROAT WITH OR WITHOUT PRESENCE OF ULCERS (sore throat, sores in mouth, or a toothache) UNUSUAL RASH, SWELLING OR PAIN  UNUSUAL VAGINAL DISCHARGE OR ITCHING   Items with * indicate a potential emergency and should be followed up as soon as possible or go to the Emergency Department if any problems should occur.  Please show the CHEMOTHERAPY ALERT CARD or IMMUNOTHERAPY ALERT CARD at check-in to the Emergency Department and triage nurse.  Should you have questions after your visit or need to cancel or reschedule your appointment, please contact [facility]  [place] [phone]  and follow the prompts.  Office hours are 8:00 a.m. to 4:30 p.m. Monday - Friday. Please note that voicemails left after 4:00 p.m. may not be returned until the following business day.  We are closed weekends and major holidays. You have access to a nurse at all times for urgent questions. Please call the main number to the clinic [phone] and follow the prompts.  For any non-urgent questions, you may also contact your provider using MyChart. We now offer e-Visits for anyone 18 and older to request care online for non-urgent symptoms. For details visit [email or web address].   Also download the MyChart app! Go to the app store, search "MyChart", open the app, select , and log in with your MyChart username and password.   

## 2022-04-17 NOTE — Progress Notes (Signed)
Pt presents today for Venofer IV iron infusion per provider's order. Vital signs stable and pt voiced no new complaints at this time.  Peripheral IV started with good blood return pre and post infusion.  Venofer 300 mg  given today per MD orders. Tolerated infusion without adverse affects. Vital signs stable. No complaints at this time. Discharged from clinic ambulatory in stable condition. Alert and oriented x 3. F/U with [facility] as scheduled.   

## 2022-04-17 NOTE — Progress Notes (Signed)
Chaplain engaged in an initial visit with Deanna Thompson.  Chaplain was able to learn about Deanna Thompson's life and healthcare journey.    Chaplain built rapport and offered support, presence, and community.     04/17/22 1100  Spiritual Encounters  Type of Visit Initial  Care provided to: Patient

## 2022-04-24 ENCOUNTER — Inpatient Hospital Stay: Payer: Medicare HMO

## 2022-04-24 VITALS — BP 125/66 | HR 73 | Temp 98.6°F | Resp 16

## 2022-04-24 DIAGNOSIS — D500 Iron deficiency anemia secondary to blood loss (chronic): Secondary | ICD-10-CM

## 2022-04-24 MED ORDER — SODIUM CHLORIDE 0.9 % IV SOLN
300.0000 mg | Freq: Once | INTRAVENOUS | Status: AC
  Administered 2022-04-24: 300 mg via INTRAVENOUS
  Filled 2022-04-24: qty 300

## 2022-04-24 MED ORDER — SODIUM CHLORIDE 0.9 % IV SOLN: Freq: Once | INTRAVENOUS | Status: AC

## 2022-04-24 NOTE — Patient Instructions (Signed)
[facility]  Discharge Instructions: Thank you for choosing [facility] to provide your oncology and hematology care.  If you have a lab appointment with the Cancer Center, please come in thru the Main Entrance and check in at the main information desk.  Wear comfortable clothing and clothing appropriate for easy access to any Portacath or PICC line.   We strive to give you quality time with your provider. You may need to reschedule your appointment if you arrive late (15 or more minutes).  Arriving late affects you and other patients whose appointments are after yours.  Also, if you miss three or more appointments without notifying the office, you may be dismissed from the clinic at the provider's discretion.      For prescription refill requests, have your pharmacy contact our office and allow 72 hours for refills to be completed.    Today you received the following Venofer, return as scheduled.   To help prevent nausea and vomiting after your treatment, we encourage you to take your nausea medication as directed.  BELOW ARE SYMPTOMS THAT SHOULD BE REPORTED IMMEDIATELY: *FEVER GREATER THAN 100.4 F (38 C) OR HIGHER *CHILLS OR SWEATING *NAUSEA AND VOMITING THAT IS NOT CONTROLLED WITH YOUR NAUSEA MEDICATION *UNUSUAL SHORTNESS OF BREATH *UNUSUAL BRUISING OR BLEEDING *URINARY PROBLEMS (pain or burning when urinating, or frequent urination) *BOWEL PROBLEMS (unusual diarrhea, constipation, pain near the anus) TENDERNESS IN MOUTH AND THROAT WITH OR WITHOUT PRESENCE OF ULCERS (sore throat, sores in mouth, or a toothache) UNUSUAL RASH, SWELLING OR PAIN  UNUSUAL VAGINAL DISCHARGE OR ITCHING   Items with * indicate a potential emergency and should be followed up as soon as possible or go to the Emergency Department if any problems should occur.  Please show the CHEMOTHERAPY ALERT CARD or IMMUNOTHERAPY ALERT CARD at check-in to the Emergency Department and triage  nurse.  Should you have questions after your visit or need to cancel or reschedule your appointment, please contact [facility] [phone]  and follow the prompts.  Office hours are 8:00 a.m. to 4:30 p.m. Monday - Friday. Please note that voicemails left after 4:00 p.m. may not be returned until the following business day.  We are closed weekends and major holidays. You have access to a nurse at all times for urgent questions. Please call the main number to the clinic [phone] and follow the prompts.  For any non-urgent questions, you may also contact your provider using MyChart. We now offer e-Visits for anyone 18 and older to request care online for non-urgent symptoms. For details visit [email or web address].   Also download the MyChart app! Go to the app store, search "MyChart", open the app, select [place], and log in with your MyChart username and password.   

## 2022-04-24 NOTE — Progress Notes (Signed)
Patient presents today for iron, patient reports taking pre-meds at home. Patient tolerated iron infusion with no complaints voiced. Peripheral IV site clean and dry with good blood return noted before and after infusion. Band aid applied. VSS with discharge and left in satisfactory condition with no s/s of distress noted.

## 2022-04-25 ENCOUNTER — Other Ambulatory Visit: Payer: Self-pay | Admitting: Family Medicine

## 2022-04-25 DIAGNOSIS — Z8673 Personal history of transient ischemic attack (TIA), and cerebral infarction without residual deficits: Secondary | ICD-10-CM

## 2022-04-25 DIAGNOSIS — I10 Essential (primary) hypertension: Secondary | ICD-10-CM

## 2022-05-01 ENCOUNTER — Inpatient Hospital Stay: Payer: Medicare HMO | Attending: Hematology

## 2022-05-01 VITALS — BP 106/66 | HR 70 | Temp 98.1°F | Resp 18

## 2022-05-01 DIAGNOSIS — Z79899 Other long term (current) drug therapy: Secondary | ICD-10-CM | POA: Insufficient documentation

## 2022-05-01 DIAGNOSIS — D500 Iron deficiency anemia secondary to blood loss (chronic): Secondary | ICD-10-CM | POA: Diagnosis present

## 2022-05-01 DIAGNOSIS — K31811 Angiodysplasia of stomach and duodenum with bleeding: Secondary | ICD-10-CM | POA: Insufficient documentation

## 2022-05-01 MED ORDER — SODIUM CHLORIDE 0.9 % IV SOLN
300.0000 mg | Freq: Once | INTRAVENOUS | Status: AC
  Administered 2022-05-01: 300 mg via INTRAVENOUS
  Filled 2022-05-01: qty 300

## 2022-05-01 MED ORDER — SODIUM CHLORIDE 0.9 % IV SOLN: Freq: Once | INTRAVENOUS | Status: AC

## 2022-05-01 NOTE — Patient Instructions (Signed)
[facility]  Discharge Instructions: Thank you for choosing [facility] to provide your oncology and hematology care.  If you have a lab appointment with the Cancer Center, please come in thru the Main Entrance and check in at the main information desk.  Wear comfortable clothing and clothing appropriate for easy access to any Portacath or PICC line.   We strive to give you quality time with your provider. You may need to reschedule your appointment if you arrive late (15 or more minutes).  Arriving late affects you and other patients whose appointments are after yours.  Also, if you miss three or more appointments without notifying the office, you may be dismissed from the clinic at the provider's discretion.      For prescription refill requests, have your pharmacy contact our office and allow 72 hours for refills to be completed.    Today you received Venofer IV iron infusion.     BELOW ARE SYMPTOMS THAT SHOULD BE REPORTED IMMEDIATELY: *FEVER GREATER THAN 100.4 F (38 C) OR HIGHER *CHILLS OR SWEATING *NAUSEA AND VOMITING THAT IS NOT CONTROLLED WITH YOUR NAUSEA MEDICATION *UNUSUAL SHORTNESS OF BREATH *UNUSUAL BRUISING OR BLEEDING *URINARY PROBLEMS (pain or burning when urinating, or frequent urination) *BOWEL PROBLEMS (unusual diarrhea, constipation, pain near the anus) TENDERNESS IN MOUTH AND THROAT WITH OR WITHOUT PRESENCE OF ULCERS (sore throat, sores in mouth, or a toothache) UNUSUAL RASH, SWELLING OR PAIN  UNUSUAL VAGINAL DISCHARGE OR ITCHING   Items with * indicate a potential emergency and should be followed up as soon as possible or go to the Emergency Department if any problems should occur.  Please show the CHEMOTHERAPY ALERT CARD or IMMUNOTHERAPY ALERT CARD at check-in to the Emergency Department and triage nurse.  Should you have questions after your visit or need to cancel or reschedule your appointment, please contact [facility] [phone]  and follow the prompts.  Office hours are 8:00 a.m. to 4:30 p.m. Monday - Friday. Please note that voicemails left after 4:00 p.m. may not be returned until the following business day.  We are closed weekends and major holidays. You have access to a nurse at all times for urgent questions. Please call the main number to the clinic [phone] and follow the prompts.  For any non-urgent questions, you may also contact your provider using MyChart. We now offer e-Visits for anyone 18 and older to request care online for non-urgent symptoms. For details visit [email or web address].   Also download the MyChart app! Go to the app store, search "MyChart", open the app, select [place], and log in with your MyChart username and password.   

## 2022-05-01 NOTE — Progress Notes (Signed)
Pt presents today for Venofer IV iron infusion per provider's order. Vital signs stable and pt voiced no new complaints at this time.   Pt took pre-meds at home prior to arrival. Peripheral IV started with good blood return pre and post infusion.  Venofer 300 mg  given today per MD orders. Tolerated infusion without adverse affects. Vital signs stable. No complaints at this time. Discharged from clinic ambulatory in stable condition. Alert and oriented x 3. F/U with [facility] as scheduled.

## 2022-05-12 ENCOUNTER — Ambulatory Visit: Payer: Medicare HMO | Admitting: Family Medicine

## 2022-05-26 ENCOUNTER — Encounter: Payer: Self-pay | Admitting: Internal Medicine

## 2022-05-28 ENCOUNTER — Encounter: Payer: Self-pay | Admitting: Radiology

## 2022-06-01 ENCOUNTER — Encounter: Payer: Self-pay | Admitting: Internal Medicine

## 2022-06-16 ENCOUNTER — Encounter: Payer: Self-pay | Admitting: Family Medicine

## 2022-06-16 ENCOUNTER — Ambulatory Visit (INDEPENDENT_AMBULATORY_CARE_PROVIDER_SITE_OTHER): Payer: Medicare HMO | Admitting: Family Medicine

## 2022-06-16 VITALS — BP 117/78 | Temp 98.6°F | Wt 238.0 lb

## 2022-06-16 DIAGNOSIS — Z0001 Encounter for general adult medical examination with abnormal findings: Secondary | ICD-10-CM | POA: Diagnosis not present

## 2022-06-16 DIAGNOSIS — Z0000 Encounter for general adult medical examination without abnormal findings: Secondary | ICD-10-CM

## 2022-06-16 DIAGNOSIS — D500 Iron deficiency anemia secondary to blood loss (chronic): Secondary | ICD-10-CM

## 2022-06-16 DIAGNOSIS — Z23 Encounter for immunization: Secondary | ICD-10-CM | POA: Diagnosis not present

## 2022-06-16 DIAGNOSIS — I509 Heart failure, unspecified: Secondary | ICD-10-CM | POA: Insufficient documentation

## 2022-06-16 DIAGNOSIS — I5042 Chronic combined systolic (congestive) and diastolic (congestive) heart failure: Secondary | ICD-10-CM | POA: Diagnosis not present

## 2022-06-16 DIAGNOSIS — Z780 Asymptomatic menopausal state: Secondary | ICD-10-CM | POA: Diagnosis not present

## 2022-06-16 NOTE — Patient Instructions (Signed)
Get pap smear sometime this year.  Shingles vaccine and Tdap at the pharmacy.  Follow up in 6 months.  Take care  Dr. Lacinda Axon

## 2022-06-16 NOTE — Progress Notes (Signed)
Subjective:    Deanna Thompson is a 67 y.o. female who presents for a Welcome to Medicare exam.   Patient is doing well.  No chest pain or shortness of breath.  She is currently at her baseline.  She has no complaints or concerns at this time.  Review of Systems Per HPI        Objective:    Today's Vitals   06/16/22 1354  BP: 117/78  Temp: 98.6 F (37 C)  SpO2: 98%  Weight: 238 lb (108 kg)  Body mass index is 36.19 kg/m.  Medications Outpatient Encounter Medications as of 06/16/2022  Medication Sig   acetaminophen (TYLENOL) 500 MG tablet Take 500 mg by mouth every 6 (six) hours as needed.   aspirin EC 81 MG tablet Take 81 mg by mouth daily. Swallow whole.   atorvastatin (LIPITOR) 40 MG tablet TAKE ONE TABLET BY MOUTH ONCE DAILY.   benzonatate (TESSALON) 200 MG capsule Take 1 capsule (200 mg total) by mouth 3 (three) times daily as needed for cough.   bumetanide (BUMEX) 1 MG tablet TAKE (1) TABLET BY MOUTH TWICE DAILY.   buPROPion (WELLBUTRIN XL) 300 MG 24 hr tablet Take 300 mg by mouth daily.   Camphor-Menthol-Methyl Sal (SALONPAS) 3.04-04-08 % PTCH Place 1 patch onto the skin daily as needed (pain.).   cholecalciferol (VITAMIN D3) 25 MCG (1000 UT) tablet Take 2 tablets (2,000 Units total) by mouth daily. (Patient taking differently: Take 1,000 Units by mouth every morning.)   DULoxetine (CYMBALTA) 60 MG capsule 1 capsule   famotidine (PEPCID) 20 MG tablet Take 1 tablet (20 mg total) by mouth 2 (two) times daily before a meal.   fluticasone (FLONASE) 50 MCG/ACT nasal spray Place 1 spray into both nostrils See admin instructions. Instill 1 spray into each nostril every morning, may also use later in the day as needed for sinus congestion   lamoTRIgine (LAMICTAL) 200 MG tablet Take 200 mg by mouth daily.   LINZESS 145 MCG CAPS capsule TAKE (1) CAPSULE BY MOUTH EVERY DAY BEFORE BREAKFAST.   loratadine (CLARITIN) 10 MG tablet 1 tablet Orally Once a day   Polyvinyl  Alcohol-Povidone (REFRESH OP) Place 1 drop into both eyes 3 (three) times daily as needed (dry eyes).   Simethicone (GAS-X PO) Take 1 tablet by mouth 2 (two) times daily as needed (bloating/flatulence).   sodium chloride (OCEAN) 0.65 % nasal spray Place 1 spray into the nose 2 (two) times daily as needed for congestion.   spironolactone (ALDACTONE) 100 MG tablet TAKE 1/2 TABLET BY MOUTH TWICE DAILY.   Tofacitinib Citrate ER (XELJANZ XR) 11 MG TB24 1 tablet Orally Once a day for 30 days   XIIDRA 5 % SOLN Apply 1 drop to eye 2 (two) times daily.   [DISCONTINUED] tobramycin (TOBREX) 0.3 % ophthalmic solution SMARTSIG:In Eye(s)   No facility-administered encounter medications on file as of 06/16/2022.     History: Past Medical History:  Diagnosis Date   Abdominal hernia    repaired   Abscess of pulp of tooth 12/12/2014   left side   Anemia    Anxiety    Arthritis    back  and down flank  right side more than left   B12 deficiency 06/12/2015   Brain aneurysm    METAL COIL PREVENTS MRI IMAGING and CLIP   Cirrhosis ([place])    Presumably alcoholic cirrhosis   Complication of anesthesia    Depression    ETOH abuse  Quit in 07/2011    Fatigue    due to Sjogren's disease   Flat foot (pes planus) (acquired), left foot    Folate deficiency 06/30/2016   GERD (gastroesophageal reflux disease)    HTN (hypertension)    no longer medicated, dated on 04/25/12   IBS (irritable bowel syndrome)    Iron deficiency anemia due to chronic blood loss    Myofascial pain    Panic attack    PONV (postoperative nausea and vomiting)    Psoriatic arthritis ([place])    Right inguinal hernia 08/16/2012   Seizures ([place]) 1997   1 seizure from coil insertion but no more seizures and no meds for seizures   Sjogren's disease ([place])    soft Bipolar disorder ([place])    Stroke ([place]) 2019   [place] HEAD 02/03/2007   Qualifier: Diagnosis of  By: Aline Brochure MD, Dorothyann Peng     TIA (transient ischemic attack)     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([place])    Vertigo    Past Surgical History:  Procedure Laterality Date   AGILE CAPSULE N/A 06/21/2014   Procedure: AGILE CAPSULE;  Surgeon: Danie Binder, MD;  [facility];  Service: Endoscopy;  Laterality: N/A;  0700   BIOPSY  10/13/2011   SLF: mild gastritis/duodenal polypoid lesion    BIOPSY  06/04/2020   Procedure: BIOPSY;  Surgeon: Eloise Harman, DO;  Location: [facility];  Service: Endoscopy;;   CEREBRAL ANEURYSM REPAIR  1997   CHOLECYSTECTOMY N/A 10/28/2018   Procedure: LAPAROSCOPIC CHOLECYSTECTOMY WITH INTRAOPERATIVE CHOLANGIOGRAM;  Surgeon: Virl Cagey, MD;  Location: [facility];  Service: General;  Laterality: N/A;   COLONOSCOPY  10/13/2011   [id number] hemorrhoids/varices rectal/lesion in ascending colon(HYPERPLASTIC POLYP)   COLONOSCOPY N/A 01/17/2013   SLF: 1. Internal hemorrhoids 2. Ascending colon polyps 3.  Rectal varices.    COLONOSCOPY N/A 08/10/2016   Dr. Oneida Alar: redundant left colon. external and internal hemorhroids. 5-10 year surveillance   ESOPHAGOGASTRODUODENOSCOPY N/A 06/29/2014   SLF: 1. anemia due to polypoid lesions in teh antrum, and duodenal polyps 2. single duodenum AVM   ESOPHAGOGASTRODUODENOSCOPY (EGD) WITH PROPOFOL N/A 03/07/2019   Procedure: ESOPHAGOGASTRODUODENOSCOPY (EGD) WITH PROPOFOL;  Surgeon: Danie Binder, MD;  [facility];  Service: Endoscopy;  Laterality: N/A;  1:00pm   ESOPHAGOGASTRODUODENOSCOPY (EGD) WITH PROPOFOL N/A 03/16/2019   Procedure: ESOPHAGOGASTRODUODENOSCOPY (EGD) WITH PROPOFOL;  Surgeon: Danie Binder, MD;  [facility];  Service: Endoscopy;  Laterality: N/A;   ESOPHAGOGASTRODUODENOSCOPY (EGD) WITH PROPOFOL N/A 06/04/2020   Procedure: ESOPHAGOGASTRODUODENOSCOPY (EGD) WITH PROPOFOL;  Surgeon: Eloise Harman, DO;  [facility];  Service: Endoscopy;  Laterality: N/A;  am   FLEXIBLE SIGMOIDOSCOPY N/A 01/21/2016   Procedure: FLEXIBLE  SIGMOIDOSCOPY;  Surgeon: Danie Binder, MD;  [facility];  Service: Endoscopy;  Laterality: N/A;  [place] N/A 06/29/2014   Procedure: GIVENS CAPSULE STUDY;  Surgeon: Danie Binder, MD;  [facility];  Service: Endoscopy;  Laterality: N/A;   GIVENS CAPSULE STUDY N/A 07/13/2014   Procedure: GIVENS CAPSULE STUDY;  Surgeon: Danie Binder, MD;  [facility];  Service: Endoscopy;  Laterality: N/A;  [place] N/A 01/21/2016   Procedure: HEMORRHOID BANDING;  Surgeon: Danie Binder, MD;  [facility];  Service: Endoscopy;  Laterality: N/A;   HEMOSTASIS CLIP PLACEMENT  03/16/2019   Procedure: HEMOSTASIS CLIP PLACEMENT;  Surgeon: Danie Binder,  MD;  [facility];  Service: Endoscopy;;  x 4   HEMOSTASIS CONTROL  03/16/2019   Procedure: HEMOSTASIS CONTROL;  Surgeon: Danie Binder, MD;  [facility];  Service: Endoscopy;;  injection with epinephine 0.1mg /ml via scope and bipolar cautery with gold probe   INGUINAL HERNIA REPAIR Right 08/28/2013   Procedure: RIGHT [place];  Surgeon: Jamesetta So, MD;  Location: [facility];  Service: General;  Laterality: Right;   INSERTION OF MESH N/A 06/09/2013   Procedure: INSERTION OF MESH;  Surgeon: Jamesetta So, MD;  Location: [facility];  Service: General;  Laterality: N/A;   INSERTION OF MESH Right 08/28/2013   Procedure: INSERTION OF MESH;  Surgeon: Jamesetta So, MD;  Location: [facility];  Service: General;  Laterality: Right;   PARACENTESIS  Feb 2014   10.7 liters   TOOTH EXTRACTION Left 0000000   UMBILICAL HERNIA REPAIR N/A 06/09/2013   Procedure: UMBILICAL HERNIORRHAPHY WITH MESH;  Surgeon: Jamesetta So, MD;  Location: [facility];  Service: General;  Laterality: N/A;    Family History  Problem Relation Age of Onset   Cirrhosis Mother        nash   COPD Father    Heart disease Father    Asthma Other    Heart disease Other    Colon cancer Neg  Hx    Inflammatory bowel disease Neg Hx    Stomach cancer Neg Hx    Esophageal cancer Neg Hx    Social History   Occupational History    Employer: RETIRED  Tobacco Use   Smoking status: Former    Packs/day: 2.00    Years: 12.00    Additional pack years: 0.00    Total pack years: 24.00    Types: Cigarettes    Quit date: 03/31/1995    Years since quitting: 27.2   Smokeless tobacco: Never   Tobacco comments:    quit in 1997  Vaping Use   Vaping Use: Never used  Substance and Sexual Activity   Alcohol use: No    Comment: Bottle of wine a day for years, but quit in 07/2011   Drug use: No   Sexual activity: Not Currently    Birth control/protection: Post-menopausal    Tobacco Counseling Counseling given: Not Answered Tobacco comments: quit in 1997   Immunizations and Health Maintenance Immunization History  Administered Date(s) Administered   Influenza,inj,Quad PF,6+ Mos 12/29/2016, 01/13/2021   Influenza-Unspecified 12/06/2015, 02/15/2018, 02/25/2018   Moderna Sars-Covid-2 Vaccination 02/14/2020   PFIZER Comirnaty(Gray Top)Covid-19 Tri-Sucrose Vaccine 07/13/2019, 08/03/2019   PNEUMOCOCCAL CONJUGATE-20 06/16/2022   Zoster Recombinat (Shingrix) 02/26/2019   Health Maintenance Due  Topic Date Due   DTaP/Tdap/Td (1 - Tdap) Never done   Zoster Vaccines- Shingrix (2 of 2) 04/23/2019   PAP SMEAR-Modifier  12/06/2020   DEXA SCAN  02/02/2022    Activities of Daily Living Patient has no limitations regarding activities of daily living.  She is fully independent.  Not a fall risk.  Physical Exam   Gen.: Well-appearing  female in no acute distress. HENT: Normocephalic atraumatic. Normal TMs bilaterally. Oropharynx clear.  Eyes: No scleral icterus. Normal conjunctiva. Neck: Supple. No thyromegaly or adenopathy. Heart: Regular rate and rhythm. No murmurs appreciated. No lower extremity edema. Lungs: Clear auscultation bilaterally. No rales, rhonchi, or wheezing. Abdomen:  Soft, nontender, nondistended. No palpable organomegaly. No rebound or guarding. MSK: Normal range of motion. Neuro: No focal deficits. Psych: Normal mood and affect.  Assessment:    This is a routine wellness examination for this patient.  Vision/Hearing screen No vision or hearing deficits.  No concerns today.  Dietary issues and exercise activities discussed:  Patient is to avoid alcohol.  She is to stay active and eat a healthy well-balanced diet.  Depression Screen    06/16/2022    2:06 PM 03/16/2022    1:28 PM 11/10/2021   11:00 AM 01/13/2021   10:26 AM  PHQ 2/9 Scores  PHQ - 2 Score 2 2 0 0  PHQ- 9 Score 7 11 0      Fall Risk    03/16/2022    1:28 PM  Fall Risk   Falls in the past year? 1  Number falls in past yr: 0  Injury with Fall? 0    Cognitive Function: Normal cognitive function.        Patient Care Team: Coral Spikes, DO as PCP - General (Family Medicine) Harl Bowie, Alphonse Guild, MD as PCP - Cardiology (Cardiology) Hadassah Pais (Internal Medicine) Eloise Harman, DO as Consulting Physician (Internal Medicine) [place], Peter M, MD as Consulting Physician (Cardiology)     Plan:    I have personally reviewed and noted the following in the patient's chart:   Medical and social history Use of alcohol, tobacco or illicit drugs  Current medications and supplements Functional ability and status Nutritional status Physical activity Advanced directives List of other physicians Hospitalizations, surgeries, and ER visits in previous 12 months Vitals Screenings to include cognitive, depression, and falls Referrals and appointments  In addition, I have reviewed and discussed with patient certain preventive protocols, quality metrics, and best practice recommendations. A written personalized care plan for preventive services as well as general preventive health recommendations were provided to patient.  Patient was advised that she can get her  tetanus at the pharmacy.  She is due for a second dose of shingles vaccine.  She is amenable to getting this at her pharmacy.  DEXA scan ordered.  Pneumococcal vaccine given today.  Patient will follow-up with OB/GYN regarding Pap smear.  She is overdue.  Coral Spikes, DO 06/16/2022

## 2022-06-17 ENCOUNTER — Encounter: Payer: Self-pay | Admitting: Family Medicine

## 2022-06-22 ENCOUNTER — Ambulatory Visit (HOSPITAL_COMMUNITY)
Admission: RE | Admit: 2022-06-22 | Discharge: 2022-06-22 | Disposition: A | Discharge status: Home / Self Care | Payer: Medicare HMO | Source: Ambulatory Visit | Attending: Family Medicine | Admitting: Family Medicine

## 2022-06-22 DIAGNOSIS — Z780 Asymptomatic menopausal state: Secondary | ICD-10-CM | POA: Insufficient documentation

## 2022-06-22 NOTE — Addendum Note (Signed)
Addended by: Coral Spikes on: 06/22/2022 11:35 AM   Modules accepted: Level of Service

## 2022-07-01 ENCOUNTER — Other Ambulatory Visit: Payer: Medicare HMO

## 2022-07-01 ENCOUNTER — Inpatient Hospital Stay: Payer: Medicare HMO | Attending: Hematology

## 2022-07-01 DIAGNOSIS — R5383 Other fatigue: Secondary | ICD-10-CM | POA: Diagnosis not present

## 2022-07-01 DIAGNOSIS — Z8673 Personal history of transient ischemic attack (TIA), and cerebral infarction without residual deficits: Secondary | ICD-10-CM | POA: Insufficient documentation

## 2022-07-01 DIAGNOSIS — Z8679 Personal history of other diseases of the circulatory system: Secondary | ICD-10-CM | POA: Insufficient documentation

## 2022-07-01 DIAGNOSIS — R42 Dizziness and giddiness: Secondary | ICD-10-CM | POA: Insufficient documentation

## 2022-07-01 DIAGNOSIS — Z79899 Other long term (current) drug therapy: Secondary | ICD-10-CM | POA: Insufficient documentation

## 2022-07-01 DIAGNOSIS — D500 Iron deficiency anemia secondary to blood loss (chronic): Secondary | ICD-10-CM | POA: Diagnosis not present

## 2022-07-01 DIAGNOSIS — N1831 Chronic kidney disease, stage 3a: Secondary | ICD-10-CM | POA: Diagnosis not present

## 2022-07-01 DIAGNOSIS — I129 Hypertensive chronic kidney disease with stage 1 through stage 4 chronic kidney disease, or unspecified chronic kidney disease: Secondary | ICD-10-CM | POA: Insufficient documentation

## 2022-07-01 DIAGNOSIS — K31811 Angiodysplasia of stomach and duodenum with bleeding: Secondary | ICD-10-CM | POA: Diagnosis present

## 2022-07-01 DIAGNOSIS — E785 Hyperlipidemia, unspecified: Secondary | ICD-10-CM | POA: Diagnosis not present

## 2022-07-01 LAB — FERRITIN: Ferritin: 60 ng/mL (ref 11–307)

## 2022-07-01 LAB — CBC WITH DIFFERENTIAL/PLATELET
Abs Immature Granulocytes: 0.01 10*3/uL (ref 0.00–0.07)
Basophils Absolute: 0 10*3/uL (ref 0.0–0.1)
Basophils Relative: 1 %
Eosinophils Absolute: 0.1 10*3/uL (ref 0.0–0.5)
Eosinophils Relative: 3 %
HCT: 38 % (ref 36.0–46.0)
Hemoglobin: 12.6 g/dL (ref 12.0–15.0)
Immature Granulocytes: 0 %
Lymphocytes Relative: 19 %
Lymphs Abs: 1 10*3/uL (ref 0.7–4.0)
MCH: 31.7 pg (ref 26.0–34.0)
MCHC: 33.2 g/dL (ref 30.0–36.0)
MCV: 95.5 fL (ref 80.0–100.0)
Monocytes Absolute: 0.5 10*3/uL (ref 0.1–1.0)
Monocytes Relative: 10 %
Neutro Abs: 3.5 10*3/uL (ref 1.7–7.7)
Neutrophils Relative %: 67 %
Platelets: 257 10*3/uL (ref 150–400)
RBC: 3.98 MIL/uL (ref 3.87–5.11)
RDW: 14.8 % (ref 11.5–15.5)
WBC: 5.2 10*3/uL (ref 4.0–10.5)
nRBC: 0 % (ref 0.0–0.2)

## 2022-07-01 LAB — IRON AND TIBC
Iron: 79 ug/dL (ref 28–170)
Saturation Ratios: 20 % (ref 10.4–31.8)
TIBC: 397 ug/dL (ref 250–450)
UIBC: 318 ug/dL

## 2022-07-06 NOTE — Progress Notes (Unsigned)
VIRTUAL VISIT via TELEPHONE NOTE [facility]   I connected with [place]  on 07/07/2022 at 10:00 AM by telephone and verified that I am speaking with the correct person using two identifiers.  Location: Patient: Home Provider: [facility]   I discussed the limitations, risks, security and privacy concerns of performing an evaluation and management service by telephone and the availability of in person appointments. I also discussed with the patient that there may be a patient responsible charge related to this service. The patient expressed understanding and agreed to proceed.  REASON FOR VISIT: Iron deficiency anemia (bleeding AVMs)  CURRENT THERAPY: Intermittent IV iron  INTERVAL HISTORY:  [name] is contacted today for follow-up of her iron deficiency anemia.Marland Kitchen  She was last seen by Rojelio Brenner PA-C on 03/17/2022. At today's visit, she reports feeling fairly well.  Her last IV iron was with Venofer 300 mg x 3 doses from 04/17/2022 through 05/01/2022.  She initially had some improved energy, but has had some increased fatigue over the past few weeks, currently reports baseline fatigue with energy about 75%.  She denies any pica, restless legs, headaches, chest pain, dyspnea on exertion, lightheadedness, or syncope.  She has a history of bleeding AVMs, but has not had any recent hematemesis, hematochezia, or melena. She has 75% energy and 100% appetite. She endorses that she is maintaining a stable weight.   REVIEW OF SYSTEMS:   Review of Systems  Constitutional:  Negative for chills, diaphoresis, fever, malaise/fatigue and weight loss.  Respiratory:  Negative for cough and shortness of breath.   Cardiovascular:  Negative for chest pain and palpitations.  Gastrointestinal:  Negative for abdominal pain, blood in stool, melena, nausea and vomiting.  Neurological:  Positive for dizziness (vertigo). Negative for headaches.   Psychiatric/Behavioral:  Positive for depression. The patient is nervous/anxious.      PHYSICAL EXAM: (per limitations of virtual telephone visit)  The patient is alert and oriented x 3, exhibiting adequate mentation, good mood, and ability to speak in full sentences and execute sound judgement.  ASSESSMENT & PLAN:  1.  Iron deficiency anemia due to chronic blood loss - Due to chronic GI blood loss. - Patient was hospitalized in December 2020 for an AVM bleed where her hemoglobin dropped to 7.7. 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  Injected.  Treated with bipolar cautery.  Clips were placed. - EGD (06/04/2020): Esophageal nodule, gastritis.  No varices. - She has been transfused RBCs in the past - She last received IV Venofer 900 mg in January/February 2024 - No bright red blood per rectum or melena - Reports baseline fatigue - Most recent labs (07/01/2022): Hgb 14.8/MCV 95.5, ferritin 60, iron saturation 20% - PLAN: We will hold off on IV iron for the time being, the patient will call if she has any worsening fatigue prior to her follow-up visit.   --  Repeat CBC/iron panel and RTC with PHONE visit in 3 months.    2.  Other history - Her other pertinent medical history includes CKD stage IIIa,  liver cirrhosis, history of cerebral aneurysm with stroke, hyperlipidemia, and hypertension   PLAN SUMMARY: >> Labs in 3 months = CBC/D, ferritin, iron/TIBC >> FOLLOW-up visit after labs     I discussed the assessment and treatment plan with the patient. The patient was provided an opportunity to ask questions and all were  answered. The patient agreed with the plan and demonstrated an understanding of the instructions.   The patient was advised to call back or seek an in-person evaluation if the symptoms worsen or if the condition fails to improve as anticipated.  I provided 15 minutes of non-face-to-face time during  this encounter.  Carnella Guadalajara, PA-C 07/07/2022 10:19 AM

## 2022-07-07 ENCOUNTER — Other Ambulatory Visit: Payer: Self-pay

## 2022-07-07 ENCOUNTER — Telehealth: Payer: Medicare HMO | Admitting: Physician Assistant

## 2022-07-07 ENCOUNTER — Inpatient Hospital Stay (HOSPITAL_BASED_OUTPATIENT_CLINIC_OR_DEPARTMENT_OTHER): Payer: Medicare HMO | Admitting: Physician Assistant

## 2022-07-07 DIAGNOSIS — D500 Iron deficiency anemia secondary to blood loss (chronic): Secondary | ICD-10-CM

## 2022-07-21 ENCOUNTER — Other Ambulatory Visit (HOSPITAL_COMMUNITY)
Admission: RE | Admit: 2022-07-21 | Discharge: 2022-07-21 | Disposition: A | Discharge status: Home / Self Care | Payer: Medicare HMO | Source: Ambulatory Visit | Attending: Obstetrics & Gynecology | Admitting: Obstetrics & Gynecology

## 2022-07-21 ENCOUNTER — Encounter: Payer: Self-pay | Admitting: Obstetrics & Gynecology

## 2022-07-21 ENCOUNTER — Ambulatory Visit (INDEPENDENT_AMBULATORY_CARE_PROVIDER_SITE_OTHER): Payer: Medicare HMO | Admitting: Obstetrics & Gynecology

## 2022-07-21 VITALS — BP 119/74 | HR 84 | Ht 68.0 in | Wt 242.0 lb

## 2022-07-21 DIAGNOSIS — Z1211 Encounter for screening for malignant neoplasm of colon: Secondary | ICD-10-CM | POA: Diagnosis not present

## 2022-07-21 DIAGNOSIS — Z1212 Encounter for screening for malignant neoplasm of rectum: Secondary | ICD-10-CM | POA: Diagnosis not present

## 2022-07-21 DIAGNOSIS — Z1151 Encounter for screening for human papillomavirus (HPV): Secondary | ICD-10-CM | POA: Diagnosis not present

## 2022-07-21 DIAGNOSIS — Z01419 Encounter for gynecological examination (general) (routine) without abnormal findings: Secondary | ICD-10-CM

## 2022-07-21 NOTE — Addendum Note (Signed)
Addended by: Annamarie Dawley on: 07/21/2022 12:22 PM   Modules accepted: Orders

## 2022-07-21 NOTE — Progress Notes (Signed)
Subjective:     Deanna Thompson is a 67 y.o. female here for a routine exam.  No LMP recorded. Patient is postmenopausal. G0P0000 Birth Control Method:  menopausal Menstrual Calendar(currently): amenorrhea  Current complaints: none.   Current acute medical issues:  alcoholism-->recovered, bipolar   Recent Gynecologic History No LMP recorded. Patient is postmenopausal. Last Pap: 2019,  normal Last mammogram: 2024,    Past Medical History:  Diagnosis Date   Abdominal hernia    repaired   Abscess of pulp of tooth 12/12/2014   left side   Anemia    Anxiety    Arthritis    back  and down flank  right side more than left   B12 deficiency 06/12/2015   Brain aneurysm    METAL COIL PREVENTS MRI IMAGING and CLIP   Cirrhosis    Presumably alcoholic cirrhosis   Complication of anesthesia    Depression    ETOH abuse    Quit in 07/2011    Fatigue    due to Sjogren's disease   Flat foot (pes planus) (acquired), left foot    Folate deficiency 06/30/2016   GERD (gastroesophageal reflux disease)    HTN (hypertension)    no longer medicated, dated on 04/25/12   IBS (irritable bowel syndrome)    Iron deficiency anemia due to chronic blood loss    Myofascial pain    Panic attack    PONV (postoperative nausea and vomiting)    Psoriatic arthritis    Right inguinal hernia 08/16/2012   Seizures 1997   1 seizure from coil insertion but no more seizures and no meds for seizures   Sjogren's disease    soft Bipolar disorder (HCC)    Stroke 2019   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   Qualifier: Diagnosis of  By: Romeo Apple MD, Duffy Rhody     TIA (transient ischemic attack)    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Vertigo     Past Surgical History:  Procedure Laterality Date   AGILE CAPSULE N/A 06/21/2014   Procedure: AGILE CAPSULE;  Surgeon: West Bali, MD;  [facility];  Service: Endoscopy;  Laterality: N/A;  0700   BIOPSY  10/13/2011   SLF: mild gastritis/duodenal  polypoid lesion    BIOPSY  06/04/2020   Procedure: BIOPSY;  Surgeon: Lanelle Bal, DO;  [facility];  Service: Endoscopy;;   CEREBRAL ANEURYSM REPAIR  1997   CHOLECYSTECTOMY N/A 10/28/2018   Procedure: LAPAROSCOPIC CHOLECYSTECTOMY WITH INTRAOPERATIVE CHOLANGIOGRAM;  Surgeon: Lucretia Roers, MD;  Location: [facility];  Service: General;  Laterality: N/A;   COLONOSCOPY  10/13/2011   [id number] hemorrhoids/varices rectal/lesion in ascending colon(HYPERPLASTIC POLYP)   COLONOSCOPY N/A 01/17/2013   SLF: 1. Internal hemorrhoids 2. Ascending colon polyps 3.  Rectal varices.    COLONOSCOPY N/A 08/10/2016   Dr. Darrick Penna: redundant left colon. external and internal hemorhroids. 5-10 year surveillance   ESOPHAGOGASTRODUODENOSCOPY N/A 06/29/2014   SLF: 1. anemia due to polypoid lesions in teh antrum, and duodenal polyps 2. single duodenum AVM   ESOPHAGOGASTRODUODENOSCOPY (EGD) WITH PROPOFOL N/A 03/07/2019   Procedure: ESOPHAGOGASTRODUODENOSCOPY (EGD) WITH PROPOFOL;  Surgeon: West Bali, MD;  [facility];  Service: Endoscopy;  Laterality: N/A;  1:00pm   ESOPHAGOGASTRODUODENOSCOPY (EGD) WITH PROPOFOL N/A 03/16/2019   Procedure: ESOPHAGOGASTRODUODENOSCOPY (EGD) WITH PROPOFOL;  Surgeon: West Bali, MD;  [facility];  Service: Endoscopy;  Laterality: N/A;   ESOPHAGOGASTRODUODENOSCOPY (EGD) WITH PROPOFOL N/A 06/04/2020   Procedure: ESOPHAGOGASTRODUODENOSCOPY (EGD)  WITH PROPOFOL;  Surgeon: Lanelle Bal, DO;  [facility];  Service: Endoscopy;  Laterality: N/A;  am   FLEXIBLE SIGMOIDOSCOPY N/A 01/21/2016   Procedure: FLEXIBLE SIGMOIDOSCOPY;  Surgeon: West Bali, MD;  [facility];  Service: Endoscopy;  Laterality: N/A;  245   GIVENS CAPSULE STUDY N/A 06/29/2014   Procedure: GIVENS CAPSULE STUDY;  Surgeon: West Bali, MD;  [facility];  Service: Endoscopy;  Laterality: N/A;   GIVENS CAPSULE STUDY N/A 07/13/2014   Procedure:  GIVENS CAPSULE STUDY;  Surgeon: West Bali, MD;  [facility];  Service: Endoscopy;  Laterality: N/A;  700   HEMORRHOID BANDING N/A 01/21/2016   Procedure: HEMORRHOID BANDING;  Surgeon: West Bali, MD;  [facility];  Service: Endoscopy;  Laterality: N/A;   HEMOSTASIS CLIP PLACEMENT  03/16/2019   Procedure: HEMOSTASIS CLIP PLACEMENT;  Surgeon: West Bali, MD;  [facility];  Service: Endoscopy;;  x 4   HEMOSTASIS CONTROL  03/16/2019   Procedure: HEMOSTASIS CONTROL;  Surgeon: West Bali, MD;  [facility];  Service: Endoscopy;;  injection with epinephine 0.1mg /ml via scope and bipolar cautery with gold probe   INGUINAL HERNIA REPAIR Right 08/28/2013   Procedure: RIGHT NGUINAL HERNIORRHAPHY WITH MESH;  Surgeon: Dalia Heading, MD;  [facility];  Service: General;  Laterality: Right;   INSERTION OF MESH N/A 06/09/2013   Procedure: INSERTION OF MESH;  Surgeon: Dalia Heading, MD;  [facility];  Service: General;  Laterality: N/A;   INSERTION OF MESH Right 08/28/2013   Procedure: INSERTION OF MESH;  Surgeon: Dalia Heading, MD;  [facility];  Service: General;  Laterality: Right;   PARACENTESIS  Feb 2014   10.7 liters   TOOTH EXTRACTION Left 12/12/14   UMBILICAL HERNIA REPAIR N/A 06/09/2013   Procedure: UMBILICAL HERNIORRHAPHY WITH MESH;  Surgeon: Dalia Heading, MD;  Location: [facility];  Service: General;  Laterality: N/A;    OB History     Gravida  0   Para  0   Term  0   Preterm  0   AB  0   Living  0      SAB  0   IAB  0   Ectopic  0   Multiple  0   Live Births  0           Social History   Socioeconomic History   Marital status: Married    Spouse name: Transport planner   Number of children: 0   Years of education: Not on file   Highest education level: Not on file  Occupational History    Employer: RETIRED  Tobacco Use   Smoking status: Former    Packs/day: 2.00    Years: 12.00    Additional  pack years: 0.00    Total pack years: 24.00    Types: Cigarettes    Quit date: 03/31/1995    Years since quitting: 27.3   Smokeless tobacco: Never   Tobacco comments:    quit in 1997  Vaping Use   Vaping Use: Never used  Substance and Sexual Activity   Alcohol use: No    Comment: Bottle of wine a day for years, but quit in 07/2011   Drug use: No   Sexual activity: Not Currently    Birth control/protection: Post-menopausal  Other Topics Concern   Not on file  Social History Narrative  Lives   Caffeine use:    Quit drinking alcohol 3 YEARS ago. No drugs, no IVDU.    Social Determinants of Health   Financial Resource Strain: Low Risk  (07/21/2022)   Overall Financial Resource Strain (CARDIA)    Difficulty of Paying Living Expenses: Not hard at all  Food Insecurity: No Food Insecurity (07/21/2022)   Hunger Vital Sign    Worried About Running Out of Food in the Last Year: Never true    Ran Out of Food in the Last Year: Never true  Transportation Needs: No Transportation Needs (07/21/2022)   PRAPARE - Administrator, Civil Service (Medical): No    Lack of Transportation (Non-Medical): No  Physical Activity: Inactive (07/21/2022)   Exercise Vital Sign    Days of Exercise per Week: 0 days    Minutes of Exercise per Session: 0 min  Stress: Stress Concern Present (07/21/2022)   [employer] of Occupational Health - Occupational Stress Questionnaire    Feeling of Stress : Rather much  Social Connections: Moderately Isolated (07/21/2022)   Social Connection and Isolation Panel [NHANES]    Frequency of Communication with Friends and Family: Once a week    Frequency of Social Gatherings with Friends and Family: Twice a week    Attends Religious Services: Never    Database administrator or Organizations: No    Attends Engineer, structural: Never    Marital Status: Married    Family History  Problem Relation Age of Onset   Cirrhosis Mother        nash   COPD  Father    Heart disease Father    Asthma Other    Heart disease Other    Colon cancer Neg Hx    Inflammatory bowel disease Neg Hx    Stomach cancer Neg Hx    Esophageal cancer Neg Hx      Current Outpatient Medications:    acetaminophen (TYLENOL) 500 MG tablet, Take 500 mg by mouth every 6 (six) hours as needed., Disp: , Rfl:    aspirin EC 81 MG tablet, Take 81 mg by mouth daily. Swallow whole., Disp: , Rfl:    atorvastatin (LIPITOR) 40 MG tablet, TAKE ONE TABLET BY MOUTH ONCE DAILY., Disp: 90 tablet, Rfl: 0   benzonatate (TESSALON) 200 MG capsule, Take 1 capsule (200 mg total) by mouth 3 (three) times daily as needed for cough., Disp: 30 capsule, Rfl: 3   bumetanide (BUMEX) 1 MG tablet, TAKE (1) TABLET BY MOUTH TWICE DAILY., Disp: 60 tablet, Rfl: 6   buPROPion (WELLBUTRIN XL) 300 MG 24 hr tablet, Take 300 mg by mouth daily., Disp: , Rfl:    Camphor-Menthol-Methyl Sal (SALONPAS) 3.04-04-08 % PTCH, Place 1 patch onto the skin daily as needed (pain.)., Disp: , Rfl:    DULoxetine (CYMBALTA) 60 MG capsule, 1 capsule, Disp: , Rfl:    famotidine (PEPCID) 20 MG tablet, Take 1 tablet (20 mg total) by mouth 2 (two) times daily before a meal., Disp: 60 tablet, Rfl: 5   fluticasone (FLONASE) 50 MCG/ACT nasal spray, Place 1 spray into both nostrils See admin instructions. Instill 1 spray into each nostril every morning, may also use later in the day as needed for sinus congestion, Disp: , Rfl:    lamoTRIgine (LAMICTAL) 200 MG tablet, Take 200 mg by mouth daily., Disp: , Rfl:    LINZESS 145 MCG CAPS capsule, TAKE (1) CAPSULE BY MOUTH EVERY DAY BEFORE BREAKFAST., Disp: 90 capsule, Rfl:  0   loratadine (CLARITIN) 10 MG tablet, 1 tablet Orally Once a day, Disp: , Rfl:    Polyvinyl Alcohol-Povidone (REFRESH OP), Place 1 drop into both eyes 3 (three) times daily as needed (dry eyes)., Disp: , Rfl:    Simethicone (GAS-X PO), Take 1 tablet by mouth 2 (two) times daily as needed (bloating/flatulence)., Disp: ,  Rfl:    sodium chloride (OCEAN) 0.65 % nasal spray, Place 1 spray into the nose 2 (two) times daily as needed for congestion., Disp: , Rfl:    spironolactone (ALDACTONE) 100 MG tablet, TAKE 1/2 TABLET BY MOUTH TWICE DAILY., Disp: 30 tablet, Rfl: 0   Tofacitinib Citrate ER (XELJANZ XR) 11 MG TB24, 1 tablet Orally Once a day for 30 days, Disp: , Rfl:    XIIDRA 5 % SOLN, Apply 1 drop to eye 2 (two) times daily., Disp: , Rfl:    cholecalciferol (VITAMIN D3) 25 MCG (1000 UT) tablet, Take 2 tablets (2,000 Units total) by mouth daily. (Patient not taking: Reported on 07/21/2022), Disp: 30 tablet, Rfl: 11  Review of Systems  Review of Systems  Constitutional: Negative for fever, chills, weight loss, malaise/fatigue and diaphoresis.  HENT: Negative for hearing loss, ear pain, nosebleeds, congestion, sore throat, neck pain, tinnitus and ear discharge.   Eyes: Negative for blurred vision, double vision, photophobia, pain, discharge and redness.  Respiratory: Negative for cough, hemoptysis, sputum production, shortness of breath, wheezing and stridor.   Cardiovascular: Negative for chest pain, palpitations, orthopnea, claudication, leg swelling and PND.  Gastrointestinal: negative for abdominal pain. Negative for heartburn, nausea, vomiting, diarrhea, constipation, blood in stool and melena.  Genitourinary: Negative for dysuria, urgency, frequency, hematuria and flank pain.  Musculoskeletal: Negative for myalgias, back pain, joint pain and falls.  Skin: Negative for itching and rash.  Neurological: Negative for dizziness, tingling, tremors, sensory change, speech change, focal weakness, seizures, loss of consciousness, weakness and headaches.  Endo/Heme/Allergies: Negative for environmental allergies and polydipsia. Does not bruise/bleed easily.  Psychiatric/Behavioral: Negative for depression, suicidal ideas, hallucinations, memory loss and substance abuse. The patient is not nervous/anxious and does not  have insomnia.        Objective:  Blood pressure 119/74, pulse 84, height  (1.727 m), weight 242 lb (109.8 kg).   Physical Exam  Vitals reviewed. Constitutional: She is oriented to person, place, and time. She appears well-developed and well-nourished.  HENT:  Head: Normocephalic and atraumatic.        Right Ear: External ear normal.  Left Ear: External ear normal.  Nose: Nose normal.  Mouth/Throat: Oropharynx is clear and moist.  Eyes: Conjunctivae and EOM are normal. Pupils are equal, round, and reactive to light. Right eye exhibits no discharge. Left eye exhibits no discharge. No scleral icterus.  Neck: Normal range of motion. Neck supple. No tracheal deviation present. No thyromegaly present.  Cardiovascular: Normal rate, regular rhythm, normal heart sounds and intact distal pulses.  Exam reveals no gallop and no friction rub.   No murmur heard. Respiratory: Effort normal and breath sounds normal. No respiratory distress. She has no wheezes. She has no rales. She exhibits no tenderness.  GI: Soft. Bowel sounds are normal. She exhibits no distension and no mass. There is no tenderness. There is no rebound and no guarding.  Genitourinary:  Breasts no masses skin changes or nipple changes bilaterally      Vulva is normal without lesions Vagina is pink moist without discharge Cervix normal in appearance and pap is done Uterus is  normal size shape and contour Adnexa is negative with normal sized ovaries  {Rectal    hemoccult negative, normal tone, no masses  Musculoskeletal: Normal range of motion. She exhibits no edema and no tenderness.  Neurological: She is alert and oriented to person, place, and time. She has normal reflexes. She displays normal reflexes. No cranial nerve deficit. She exhibits normal muscle tone. Coordination normal.  Skin: Skin is warm and dry. No rash noted. No erythema. No pallor.  Psychiatric: She has a normal mood and affect. Her behavior is normal.  Judgment and thought content normal.       Medications Ordered at today's visit: No orders of the defined types were placed in this encounter.   Other orders placed at today's visit: No orders of the defined types were placed in this encounter.     Assessment:    Normal Gyn exam.    Plan:    No longer needs cervical cytology after this visit pt may desire in future every 5 years or so Cont yearly mammogram     Return in about 5 years (around 07/21/2027) for yearly.

## 2022-07-27 LAB — CYTOLOGY - PAP
Comment: NEGATIVE
Diagnosis: UNDETERMINED — AB
High risk HPV: NEGATIVE

## 2022-07-28 ENCOUNTER — Encounter: Payer: Self-pay | Admitting: Obstetrics & Gynecology

## 2022-08-03 ENCOUNTER — Other Ambulatory Visit: Payer: Self-pay | Admitting: Family Medicine

## 2022-08-03 DIAGNOSIS — I10 Essential (primary) hypertension: Secondary | ICD-10-CM

## 2022-08-12 ENCOUNTER — Telehealth: Payer: Self-pay | Admitting: Internal Medicine

## 2022-08-12 ENCOUNTER — Other Ambulatory Visit: Payer: Self-pay | Admitting: Family Medicine

## 2022-08-12 DIAGNOSIS — Z8673 Personal history of transient ischemic attack (TIA), and cerebral infarction without residual deficits: Secondary | ICD-10-CM

## 2022-08-12 NOTE — Telephone Encounter (Signed)
PT is calling to have her Linzess refilled. It needs to be sent to  [facility] in [place]

## 2022-08-13 ENCOUNTER — Other Ambulatory Visit: Payer: Self-pay

## 2022-08-13 MED ORDER — LINACLOTIDE 145 MCG PO CAPS: ORAL_CAPSULE | ORAL | 0 refills | Status: DC

## 2022-08-13 NOTE — Telephone Encounter (Signed)
Patient is calling to follow up on refill, would like it sent to the pharmacy below.

## 2022-08-20 ENCOUNTER — Other Ambulatory Visit (INDEPENDENT_AMBULATORY_CARE_PROVIDER_SITE_OTHER): Payer: Medicare HMO

## 2022-08-20 ENCOUNTER — Ambulatory Visit: Payer: Medicare HMO | Admitting: Internal Medicine

## 2022-08-20 VITALS — BP 130/80 | HR 84 | Ht 68.0 in | Wt 238.0 lb

## 2022-08-20 DIAGNOSIS — K7030 Alcoholic cirrhosis of liver without ascites: Secondary | ICD-10-CM

## 2022-08-20 DIAGNOSIS — K5900 Constipation, unspecified: Secondary | ICD-10-CM | POA: Diagnosis not present

## 2022-08-20 LAB — COMPREHENSIVE METABOLIC PANEL
ALT: 22 U/L (ref 0–35)
AST: 25 U/L (ref 0–37)
Albumin: 4.6 g/dL (ref 3.5–5.2)
Alkaline Phosphatase: 97 U/L (ref 39–117)
BUN: 16 mg/dL (ref 6–23)
CO2: 31 mEq/L (ref 19–32)
Calcium: 10.1 mg/dL (ref 8.4–10.5)
Chloride: 101 mEq/L (ref 96–112)
Creatinine, Ser: 1.42 mg/dL — ABNORMAL HIGH (ref 0.40–1.20)
GFR: 38.8 mL/min — ABNORMAL LOW (ref >60.00)
Glucose, Bld: 84 mg/dL (ref 70–99)
Potassium: 4.1 mEq/L (ref 3.5–5.1)
Sodium: 140 mEq/L (ref 135–145)
Total Bilirubin: 0.5 mg/dL (ref 0.2–1.2)
Total Protein: 7.9 g/dL (ref 6.0–8.3)

## 2022-08-20 LAB — PROTIME-INR
INR: 1.2 ratio — ABNORMAL HIGH (ref 0.8–1.0)
Prothrombin Time: 12.5 s (ref 9.6–13.1)

## 2022-08-20 MED ORDER — LINACLOTIDE 145 MCG PO CAPS: 145.0000 ug | ORAL_CAPSULE | Freq: Every day | ORAL | 1 refills | Status: DC

## 2022-08-20 NOTE — Patient Instructions (Signed)
We have sent the following medications to your pharmacy for you to pick up at your convenience: Linzess  If your blood pressure at your visit was 140/90 or greater, please contact your primary care physician to follow up on this.  _______________________________________________________  If you are age 66 or older, your body mass index should be between 23-30. Your Body mass index is 36.19 kg/m. If this is out of the aforementioned range listed, please consider follow up with your Primary Care Provider.  If you are age 76 or younger, your body mass index should be between 19-25. Your Body mass index is 36.19 kg/m. If this is out of the aformentioned range listed, please consider follow up with your Primary Care Provider.   ________________________________________________________  The  GI providers would like to encourage you to use [facility] to communicate with providers for non-urgent requests or questions.  Due to long hold times on the telephone, sending your provider a message by [facility] may be a faster and more efficient way to get a response.  Please allow 48 business hours for a response.  Please remember that this is for non-urgent requests.  _______________________________________________________  Your provider has requested that you go to the basement level for lab work before leaving today. Press "B" on the elevator. The lab is located at the first door on the left as you exit the elevator.   You have been scheduled for an abdominal ultrasound at [facility] (1st floor of hospital) on Thursday 09/03/22 at 11:30 am. Please arrive 30 minutes prior to your appointment for registration. Make certain not to have anything to eat or drink 6 hours prior to your appointment. Should you need to reschedule your appointment, please contact radiology at [phone]. This test typically takes about 30 minutes to perform.   Please follow up in 6 months.  Thank you for entrusting me  with your care and for choosing [facility], Dr. Eulah Pont

## 2022-08-20 NOTE — Progress Notes (Signed)
GI Clinic Follow Up  HPI: This is a very pleasant 67 year old woman with history of EtOH cirrhosis, Sjogren's syndrome, bipolar disorder, CVA, seizures, psoriatic arthritis, IBS, and GERD who presents for follow up of EtOH cirrhosis  Interval History: She still has issues with constipation. She does take her Linzess daily, which seems to be very effective. Endorses 1 BM once per day on average. Denies ab pain, N&V, swelling in the legs and abdomen, confusion, or blood in the stools. She is trying to follow a low sodium diet. Denies alcohol use. She has deliberately been losing weight.   Wt Readings from Last 3 Encounters:  08/20/22 238 lb (108 kg)  07/21/22 242 lb (109.8 kg)  06/16/22 238 lb (108 kg)   Current Outpatient Medications  Medication Instructions   acetaminophen (TYLENOL) 500 mg, Oral, Every 6 hours PRN   aspirin EC 81 mg, Oral, Daily, Swallow whole.   atorvastatin (LIPITOR) 40 MG tablet TAKE ONE TABLET BY MOUTH ONCE DAILY.   benzonatate (TESSALON) 200 mg, Oral, 3 times daily PRN   bumetanide (BUMEX) 1 MG tablet TAKE (1) TABLET BY MOUTH TWICE DAILY.   buPROPion (WELLBUTRIN XL) 300 mg, Oral, Daily   Camphor-Menthol-Methyl Sal (SALONPAS) 3.04-04-08 % PTCH 1 patch, Transdermal, Daily PRN   cholecalciferol (VITAMIN D3) 2,000 Units, Oral, Daily   DULoxetine (CYMBALTA) 60 MG capsule 1 capsule   famotidine (PEPCID) 20 mg, Oral, 2 times daily before meals   fluticasone (FLONASE) 50 MCG/ACT nasal spray 1 spray, Each Nare, See admin instructions, Instill 1 spray into each nostril every morning, may also use later in the day as needed for sinus congestion    lamoTRIgine (LAMICTAL) 200 mg, Oral, Daily   linaclotide (LINZESS) 145 MCG CAPS capsule TAKE (1) CAPSULE BY MOUTH EVERY DAY BEFORE BREAKFAST.   loratadine (CLARITIN) 10 MG tablet 1 tablet Orally Once a day   Polyvinyl Alcohol-Povidone (REFRESH OP) 1 drop, Both Eyes, 3 times daily PRN   Simethicone (GAS-X PO) 1 tablet, Oral, 2 times  daily PRN   sodium chloride (OCEAN) 0.65 % nasal spray 1 spray, Nasal, 2 times daily PRN   spironolactone (ALDACTONE) 100 MG tablet TAKE 1/2 TABLET BY MOUTH TWICE DAILY.   Tofacitinib Citrate ER (XELJANZ XR) 11 MG TB24 1 tablet Orally Once a day for 30 days   XIIDRA 5 % SOLN 1 drop, Ophthalmic, 2 times daily    Physical Exam: BP 130/80   Pulse 84   Ht 5\' 8"  (1.727 m)   Wt 238 lb (108 kg)   BMI 36.19 kg/m  Constitutional: generally well-appearing Psychiatric: alert and oriented x3 Eyes: extraocular movements intact Mouth: oral pharynx moist, no lesions Neck: supple no lymphadenopathy Cardiovascular: heart regular rate and rhythm Lungs: clear to auscultation bilaterally Abdomen: soft, nontender, nondistended, no obvious ascites, no peritoneal signs, normal bowel sounds Extremities: no lower extremity edema bilaterally Skin: no lesions on visible extremities  Labs 07/2021: INR with mildly elevated INR of 1.1. ANA positive at 1:40 titer. AMA negative. AFP negative. Hep A antibody NR. Hep B surface antibody NR. Hep C antibody NR.   Labs 11/2021: CBC with nml Hb. Ferritin 26. CMP with mildly elevated AST of 41 and elevated Cr of 2.17  RUQ U/S 08/05/21: IMPRESSION: Evidence of hepatic cirrhosis with no suspicious mass identified.  RUQ U/S 02/09/22:  IMPRESSION: Cirrhotic morphology of the liver. No focal lesion.  EGD 06/04/20: - Nodule found in the esophagus. Biopsied. - Gastritis. - Normal duodenal bulb, first portion of the duodenum  and second portion of the duodenum. Path: A. GASTROESOPHAGEAL JUNCTION NODULE, BIOPSY:  - Unremarkable gastric cardia mucosa.  - No intestinal metaplasia, dysplasia or carcinoma.   Colonoscopy 08/10/16: - Redundant LEFT colon. - The examination was otherwise normal. - External and internal hemorrhoids.  Assessment and plan: EtOH cirrhosis Constipation Patient presents for follow up of EtOH cirrhosis. Overall she appears to be doing well and is  adherent to a low sodium diet as well as diuretics. Will recheck her labs today and get her set up for an abdominal ultrasound for [facility] screening. Her constipation seems to be well controlled on Linzess so will continue this - Continue low sodium diet and diuretics - Check CMP and PT/INR - Continue Linzess. Refill today - RUQ U/S in  [facility]) for [facility] screening - Next EGD due in 05/2023 for variceal screening - Next colonoscopy due in 07/2026 for colon cancer screening - RTC 6 months  Eulah Pont, MD [facility] 08/20/2022, 10:35 AM  I spent 32 minutes of time, including in depth chart review, independent review of results as outlined above, communicating results with the patient directly, face-to-face time with the patient, coordinating care, and ordering studies and medications as appropriate, and documentation.

## 2022-09-03 ENCOUNTER — Ambulatory Visit (HOSPITAL_COMMUNITY)
Admission: RE | Admit: 2022-09-03 | Discharge: 2022-09-03 | Disposition: A | Discharge status: Home / Self Care | Payer: Medicare HMO | Source: Ambulatory Visit | Attending: Internal Medicine | Admitting: Internal Medicine

## 2022-09-03 DIAGNOSIS — K5900 Constipation, unspecified: Secondary | ICD-10-CM | POA: Diagnosis present

## 2022-09-03 DIAGNOSIS — K7030 Alcoholic cirrhosis of liver without ascites: Secondary | ICD-10-CM | POA: Insufficient documentation

## 2022-09-10 LAB — LAB REPORT - SCANNED: EGFR: 49

## 2022-09-30 ENCOUNTER — Inpatient Hospital Stay: Payer: Medicare HMO | Attending: Hematology

## 2022-09-30 DIAGNOSIS — M2550 Pain in unspecified joint: Secondary | ICD-10-CM | POA: Insufficient documentation

## 2022-09-30 DIAGNOSIS — E669 Obesity, unspecified: Secondary | ICD-10-CM | POA: Insufficient documentation

## 2022-09-30 DIAGNOSIS — I129 Hypertensive chronic kidney disease with stage 1 through stage 4 chronic kidney disease, or unspecified chronic kidney disease: Secondary | ICD-10-CM | POA: Insufficient documentation

## 2022-09-30 DIAGNOSIS — Z8679 Personal history of other diseases of the circulatory system: Secondary | ICD-10-CM | POA: Insufficient documentation

## 2022-09-30 DIAGNOSIS — N1831 Chronic kidney disease, stage 3a: Secondary | ICD-10-CM | POA: Insufficient documentation

## 2022-09-30 DIAGNOSIS — K7030 Alcoholic cirrhosis of liver without ascites: Secondary | ICD-10-CM | POA: Insufficient documentation

## 2022-09-30 DIAGNOSIS — Z8719 Personal history of other diseases of the digestive system: Secondary | ICD-10-CM | POA: Insufficient documentation

## 2022-09-30 DIAGNOSIS — Z8379 Family history of other diseases of the digestive system: Secondary | ICD-10-CM | POA: Insufficient documentation

## 2022-09-30 DIAGNOSIS — K31811 Angiodysplasia of stomach and duodenum with bleeding: Secondary | ICD-10-CM | POA: Insufficient documentation

## 2022-09-30 DIAGNOSIS — Z87891 Personal history of nicotine dependence: Secondary | ICD-10-CM | POA: Insufficient documentation

## 2022-09-30 DIAGNOSIS — R269 Unspecified abnormalities of gait and mobility: Secondary | ICD-10-CM | POA: Insufficient documentation

## 2022-09-30 DIAGNOSIS — D500 Iron deficiency anemia secondary to blood loss (chronic): Secondary | ICD-10-CM | POA: Insufficient documentation

## 2022-09-30 DIAGNOSIS — R5383 Other fatigue: Secondary | ICD-10-CM | POA: Insufficient documentation

## 2022-09-30 DIAGNOSIS — Z825 Family history of asthma and other chronic lower respiratory diseases: Secondary | ICD-10-CM | POA: Insufficient documentation

## 2022-09-30 DIAGNOSIS — Z8249 Family history of ischemic heart disease and other diseases of the circulatory system: Secondary | ICD-10-CM | POA: Insufficient documentation

## 2022-09-30 DIAGNOSIS — R0602 Shortness of breath: Secondary | ICD-10-CM | POA: Insufficient documentation

## 2022-09-30 DIAGNOSIS — R0609 Other forms of dyspnea: Secondary | ICD-10-CM | POA: Insufficient documentation

## 2022-09-30 DIAGNOSIS — E785 Hyperlipidemia, unspecified: Secondary | ICD-10-CM | POA: Insufficient documentation

## 2022-09-30 DIAGNOSIS — Z8673 Personal history of transient ischemic attack (TIA), and cerebral infarction without residual deficits: Secondary | ICD-10-CM | POA: Insufficient documentation

## 2022-09-30 DIAGNOSIS — K5900 Constipation, unspecified: Secondary | ICD-10-CM | POA: Insufficient documentation

## 2022-09-30 DIAGNOSIS — Z9049 Acquired absence of other specified parts of digestive tract: Secondary | ICD-10-CM | POA: Insufficient documentation

## 2022-09-30 DIAGNOSIS — Z79899 Other long term (current) drug therapy: Secondary | ICD-10-CM | POA: Insufficient documentation

## 2022-09-30 DIAGNOSIS — Z881 Allergy status to other antibiotic agents status: Secondary | ICD-10-CM | POA: Insufficient documentation

## 2022-10-06 ENCOUNTER — Inpatient Hospital Stay: Payer: Medicare HMO

## 2022-10-06 DIAGNOSIS — Z8249 Family history of ischemic heart disease and other diseases of the circulatory system: Secondary | ICD-10-CM | POA: Diagnosis not present

## 2022-10-06 DIAGNOSIS — Z87891 Personal history of nicotine dependence: Secondary | ICD-10-CM | POA: Diagnosis not present

## 2022-10-06 DIAGNOSIS — R0602 Shortness of breath: Secondary | ICD-10-CM | POA: Diagnosis not present

## 2022-10-06 DIAGNOSIS — Z8719 Personal history of other diseases of the digestive system: Secondary | ICD-10-CM | POA: Diagnosis not present

## 2022-10-06 DIAGNOSIS — K5900 Constipation, unspecified: Secondary | ICD-10-CM | POA: Diagnosis not present

## 2022-10-06 DIAGNOSIS — R5383 Other fatigue: Secondary | ICD-10-CM | POA: Diagnosis not present

## 2022-10-06 DIAGNOSIS — D500 Iron deficiency anemia secondary to blood loss (chronic): Secondary | ICD-10-CM

## 2022-10-06 DIAGNOSIS — Z881 Allergy status to other antibiotic agents status: Secondary | ICD-10-CM | POA: Diagnosis not present

## 2022-10-06 DIAGNOSIS — E669 Obesity, unspecified: Secondary | ICD-10-CM | POA: Diagnosis not present

## 2022-10-06 DIAGNOSIS — Z825 Family history of asthma and other chronic lower respiratory diseases: Secondary | ICD-10-CM | POA: Diagnosis not present

## 2022-10-06 DIAGNOSIS — Z8679 Personal history of other diseases of the circulatory system: Secondary | ICD-10-CM | POA: Diagnosis not present

## 2022-10-06 DIAGNOSIS — Z8379 Family history of other diseases of the digestive system: Secondary | ICD-10-CM | POA: Diagnosis not present

## 2022-10-06 DIAGNOSIS — Z8673 Personal history of transient ischemic attack (TIA), and cerebral infarction without residual deficits: Secondary | ICD-10-CM | POA: Diagnosis not present

## 2022-10-06 DIAGNOSIS — N1831 Chronic kidney disease, stage 3a: Secondary | ICD-10-CM | POA: Diagnosis not present

## 2022-10-06 DIAGNOSIS — I129 Hypertensive chronic kidney disease with stage 1 through stage 4 chronic kidney disease, or unspecified chronic kidney disease: Secondary | ICD-10-CM | POA: Diagnosis not present

## 2022-10-06 DIAGNOSIS — K31811 Angiodysplasia of stomach and duodenum with bleeding: Secondary | ICD-10-CM | POA: Diagnosis present

## 2022-10-06 DIAGNOSIS — Z9049 Acquired absence of other specified parts of digestive tract: Secondary | ICD-10-CM | POA: Diagnosis not present

## 2022-10-06 DIAGNOSIS — K7030 Alcoholic cirrhosis of liver without ascites: Secondary | ICD-10-CM | POA: Diagnosis not present

## 2022-10-06 DIAGNOSIS — M2550 Pain in unspecified joint: Secondary | ICD-10-CM | POA: Diagnosis not present

## 2022-10-06 DIAGNOSIS — R269 Unspecified abnormalities of gait and mobility: Secondary | ICD-10-CM | POA: Diagnosis not present

## 2022-10-06 DIAGNOSIS — E785 Hyperlipidemia, unspecified: Secondary | ICD-10-CM | POA: Diagnosis not present

## 2022-10-06 DIAGNOSIS — R0609 Other forms of dyspnea: Secondary | ICD-10-CM | POA: Diagnosis not present

## 2022-10-06 DIAGNOSIS — Z79899 Other long term (current) drug therapy: Secondary | ICD-10-CM | POA: Diagnosis not present

## 2022-10-06 LAB — CBC WITH DIFFERENTIAL/PLATELET
Abs Immature Granulocytes: 0.03 10*3/uL (ref 0.00–0.07)
Basophils Absolute: 0.1 10*3/uL (ref 0.0–0.1)
Basophils Relative: 1 %
Eosinophils Absolute: 0.2 10*3/uL (ref 0.0–0.5)
Eosinophils Relative: 3 %
HCT: 41.3 % (ref 36.0–46.0)
Hemoglobin: 13.6 g/dL (ref 12.0–15.0)
Immature Granulocytes: 0 %
Lymphocytes Relative: 16 %
Lymphs Abs: 1.2 10*3/uL (ref 0.7–4.0)
MCH: 30.8 pg (ref 26.0–34.0)
MCHC: 32.9 g/dL (ref 30.0–36.0)
MCV: 93.7 fL (ref 80.0–100.0)
Monocytes Absolute: 0.8 10*3/uL (ref 0.1–1.0)
Monocytes Relative: 10 %
Neutro Abs: 5.3 10*3/uL (ref 1.7–7.7)
Neutrophils Relative %: 70 %
Platelets: 334 10*3/uL (ref 150–400)
RBC: 4.41 MIL/uL (ref 3.87–5.11)
RDW: 14.6 % (ref 11.5–15.5)
WBC: 7.6 10*3/uL (ref 4.0–10.5)
nRBC: 0 % (ref 0.0–0.2)

## 2022-10-06 LAB — IRON AND TIBC
Iron: 93 ug/dL (ref 28–170)
Saturation Ratios: 21 % (ref 10.4–31.8)
TIBC: 455 ug/dL — ABNORMAL HIGH (ref 250–450)
UIBC: 362 ug/dL

## 2022-10-06 LAB — FERRITIN: Ferritin: 20 ng/mL (ref 11–307)

## 2022-10-07 ENCOUNTER — Inpatient Hospital Stay: Payer: Medicare HMO | Admitting: Physician Assistant

## 2022-10-13 NOTE — Progress Notes (Signed)
[facility] [address], [place] [address]   CLINIC:  Medical Oncology/Hematology  PCP:  Tommie Sams, DO [address] [place] [address] [phone]   REASON FOR VISIT: Iron deficiency anemia (bleeding AVMs)   CURRENT THERAPY: Intermittent IV iron  INTERVAL HISTORY:   Deanna Thompson 67 y.o. female returns for routine follow-up of her iron deficiency anemia.Marland Kitchen  She was last evaluated via telemedicine visit by Rojelio Brenner PA-C on 07/07/2022.  At today's visit, she reports feeling fairly well.  *** *** She currently reports baseline fatigue with energy about 75***%. *** She denies any pica, restless legs, headaches, chest pain, dyspnea on exertion, lightheadedness, or syncope.  ***She has a history of bleeding AVMs, but has not had any recent hematemesis, hematochezia, or melena. She has ***% energy and ***% appetite. She endorses that she is maintaining a stable weight. ***deliberately losing weight???   ASSESSMENT & PLAN:  1.   Iron deficiency anemia due to chronic blood loss - Due to chronic GI blood loss. - Patient was hospitalized in December 2020 for an AVM bleed where her hemoglobin dropped to 7.7. 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  Injected.  Treated with bipolar cautery.  Clips were placed. - EGD (06/04/2020): Esophageal nodule, gastritis.  No varices. - She has been transfused RBCs in the past - She last received IV Venofer 900 mg in January/February 2024 - No bright red blood per rectum or melena*** - Reports baseline fatigue*** - Most recent labs (10/06/2022): Hgb 13.6/MCV 93.7, ferritin 20, iron saturation 21% with elevated TIBC 455 - PLAN: Recommend IV iron with Venofer 300 mg x 3 *** --  Repeat CBC/iron panel and RTC with PHONE visit in 3 months.    2.  Other history - Her other pertinent medical history includes CKD stage IIIa,  liver cirrhosis,  history of cerebral aneurysm with stroke, hyperlipidemia, and hypertension  - PLAN: ***  PLAN SUMMARY: >> IV Venofer 300 mg x 3 >> Labs in 3 months = CBC/D, ferritin, iron/TIBC >> PHONE visit after labs ***    REVIEW OF SYSTEMS: ***  Review of Systems - Oncology   PHYSICAL EXAM:  ECOG PERFORMANCE STATUS: {CHL ONC ECOG XB:[id number]} *** There were no vitals filed for this visit. There were no vitals filed for this visit. Physical Exam  PAST MEDICAL/SURGICAL HISTORY:  Past Medical History:  Diagnosis Date   Abdominal hernia    repaired   Abscess of pulp of tooth 12/12/2014   left side   Anemia    Anxiety    Arthritis    back  and down flank  right side more than left   B12 deficiency 06/12/2015   Brain aneurysm    METAL COIL PREVENTS MRI IMAGING and CLIP   Cirrhosis (HCC)    Presumably alcoholic cirrhosis   Complication of anesthesia    Depression    ETOH abuse    Quit in 07/2011    Fatigue    due to Sjogren's disease   Flat foot (pes planus) (acquired), left foot    Folate deficiency 06/30/2016   GERD (gastroesophageal reflux disease)    HTN (hypertension)    no longer medicated, dated on 04/25/12   IBS (irritable bowel syndrome)    Iron deficiency anemia due to chronic blood loss    Myofascial pain    Panic attack    PONV (postoperative  nausea and vomiting)    Psoriatic arthritis (HCC)    Right inguinal hernia 08/16/2012   Seizures (HCC) 1997   1 seizure from coil insertion but no more seizures and no meds for seizures   Sjogren's disease (HCC)    soft Bipolar disorder (HCC)    Stroke (HCC) 2019   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   Qualifier: Diagnosis of  By: Romeo Apple MD, Duffy Rhody     TIA (transient ischemic attack)    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(HCC)    Vertigo    Past Surgical History:  Procedure Laterality Date   AGILE CAPSULE N/A 06/21/2014   Procedure: AGILE CAPSULE;  Surgeon: West Bali, MD;  [facility];   Service: Endoscopy;  Laterality: N/A;  0700   BIOPSY  10/13/2011   SLF: mild gastritis/duodenal polypoid lesion    BIOPSY  06/04/2020   Procedure: BIOPSY;  Surgeon: Lanelle Bal, DO;  [facility];  Service: Endoscopy;;   CEREBRAL ANEURYSM REPAIR  1997   CHOLECYSTECTOMY N/A 10/28/2018   Procedure: LAPAROSCOPIC CHOLECYSTECTOMY WITH INTRAOPERATIVE CHOLANGIOGRAM;  Surgeon: Lucretia Roers, MD;  Location: [facility];  Service: General;  Laterality: N/A;   COLONOSCOPY  10/13/2011   QMV:HQIONGEX hemorrhoids/varices rectal/lesion in ascending colon(HYPERPLASTIC POLYP)   COLONOSCOPY N/A 01/17/2013   SLF: 1. Internal hemorrhoids 2. Ascending colon polyps 3.  Rectal varices.    COLONOSCOPY N/A 08/10/2016   Dr. Darrick Penna: redundant left colon. external and internal hemorhroids. 5-10 year surveillance   ESOPHAGOGASTRODUODENOSCOPY N/A 06/29/2014   SLF: 1. anemia due to polypoid lesions in teh antrum, and duodenal polyps 2. single duodenum AVM   ESOPHAGOGASTRODUODENOSCOPY (EGD) WITH PROPOFOL N/A 03/07/2019   Procedure: ESOPHAGOGASTRODUODENOSCOPY (EGD) WITH PROPOFOL;  Surgeon: West Bali, MD;  [facility];  Service: Endoscopy;  Laterality: N/A;  1:00pm   ESOPHAGOGASTRODUODENOSCOPY (EGD) WITH PROPOFOL N/A 03/16/2019   Procedure: ESOPHAGOGASTRODUODENOSCOPY (EGD) WITH PROPOFOL;  Surgeon: West Bali, MD;  [facility];  Service: Endoscopy;  Laterality: N/A;   ESOPHAGOGASTRODUODENOSCOPY (EGD) WITH PROPOFOL N/A 06/04/2020   Procedure: ESOPHAGOGASTRODUODENOSCOPY (EGD) WITH PROPOFOL;  Surgeon: Lanelle Bal, DO;  [facility];  Service: Endoscopy;  Laterality: N/A;  am   FLEXIBLE SIGMOIDOSCOPY N/A 01/21/2016   Procedure: FLEXIBLE SIGMOIDOSCOPY;  Surgeon: West Bali, MD;  [facility];  Service: Endoscopy;  Laterality: N/A;  245   GIVENS CAPSULE STUDY N/A 06/29/2014   Procedure: GIVENS CAPSULE STUDY;  Surgeon: West Bali, MD;  [facility];  Service: Endoscopy;  Laterality: N/A;   GIVENS CAPSULE STUDY N/A 07/13/2014   Procedure: GIVENS CAPSULE STUDY;  Surgeon: West Bali, MD;  [facility];  Service: Endoscopy;  Laterality: N/A;  700   HEMORRHOID BANDING N/A 01/21/2016   Procedure: HEMORRHOID BANDING;  Surgeon: West Bali, MD;  [facility];  Service: Endoscopy;  Laterality: N/A;   HEMOSTASIS CLIP PLACEMENT  03/16/2019   Procedure: HEMOSTASIS CLIP PLACEMENT;  Surgeon: West Bali, MD;  [facility];  Service: Endoscopy;;  x 4   HEMOSTASIS CONTROL  03/16/2019   Procedure: HEMOSTASIS CONTROL;  Surgeon: West Bali, MD;  [facility];  Service: Endoscopy;;  injection with epinephine 0.1mg /ml via scope and bipolar cautery with gold probe   INGUINAL HERNIA REPAIR Right 08/28/2013   Procedure: RIGHT NGUINAL HERNIORRHAPHY WITH MESH;  Surgeon: Dalia Heading, MD;  Location: [facility];  Service: General;  Laterality: Right;  INSERTION OF MESH N/A 06/09/2013   Procedure: INSERTION OF MESH;  Surgeon: Dalia Heading, MD;  [facility]: [facility];  Service: General;  Laterality: N/A;   INSERTION OF MESH Right 08/28/2013   Procedure: INSERTION OF MESH;  Surgeon: Dalia Heading, MD;  [facility];  Service: General;  Laterality: Right;   PARACENTESIS  Feb 2014   10.7 liters   TOOTH EXTRACTION Left 12/12/14   UMBILICAL HERNIA REPAIR N/A 06/09/2013   Procedure: UMBILICAL HERNIORRHAPHY WITH MESH;  Surgeon: Dalia Heading, MD;  Location: [facility];  Service: General;  Laterality: N/A;    SOCIAL HISTORY:  Social History   Socioeconomic History   Marital status: Married    Spouse name: Transport planner   Number of children: 0   Years of education: Not on file   Highest education level: Not on file  Occupational History    Employer: RETIRED  Tobacco Use   Smoking status: Former    Current packs/day: 0.00    Average packs/day: 2.0 packs/day for 12.0 years (24.0 ttl pk-yrs)    Types: Cigarettes     Start date: 03/31/1983    Quit date: 03/31/1995    Years since quitting: 27.5   Smokeless tobacco: Never   Tobacco comments:    quit in 1997  Vaping Use   Vaping status: Never Used  Substance and Sexual Activity   Alcohol use: No    Comment: Bottle of wine a day for years, but quit in 07/2011   Drug use: No   Sexual activity: Not Currently    Birth control/protection: Post-menopausal  Other Topics Concern   Not on file  Social History Narrative   Lives   Caffeine use:    Quit drinking alcohol 3 YEARS ago. No drugs, no IVDU.    Social Determinants of Health   Financial Resource Strain: Low Risk  (07/21/2022)   Overall Financial Resource Strain (CARDIA)    Difficulty of Paying Living Expenses: Not hard at all  Food Insecurity: No Food Insecurity (07/21/2022)   Hunger Vital Sign    Worried About Running Out of Food in the Last Year: Never true    Ran Out of Food in the Last Year: Never true  Transportation Needs: No Transportation Needs (07/21/2022)   PRAPARE - Administrator, Civil Service (Medical): No    Lack of Transportation (Non-Medical): No  Physical Activity: Inactive (07/21/2022)   Exercise Vital Sign    Days of Exercise per Week: 0 days    Minutes of Exercise per Session: 0 min  Stress: Stress Concern Present (07/21/2022)   [employer] of Occupational Health - Occupational Stress Questionnaire    Feeling of Stress : Rather much  Social Connections: Moderately Isolated (07/21/2022)   Social Connection and Isolation Panel [NHANES]    Frequency of Communication with Friends and Family: Once a week    Frequency of Social Gatherings with Friends and Family: Twice a week    Attends Religious Services: Never    Database administrator or Organizations: No    Attends Banker Meetings: Never    Marital Status: Married  Catering manager Violence: Not At Risk (07/21/2022)   Humiliation, Afraid, Rape, and Kick questionnaire    Fear of Current or  Ex-Partner: No    Emotionally Abused: No    Physically Abused: No    Sexually Abused: No    FAMILY HISTORY:  Family History  Problem Relation Age of Onset   Cirrhosis Mother  nash   COPD Father    Heart disease Father    Asthma Other    Heart disease Other    Colon cancer Neg Hx    Inflammatory bowel disease Neg Hx    Stomach cancer Neg Hx    Esophageal cancer Neg Hx     CURRENT MEDICATIONS:  Outpatient Encounter Medications as of 10/14/2022  Medication Sig   acetaminophen (TYLENOL) 500 MG tablet Take 500 mg by mouth every 6 (six) hours as needed.   aspirin EC 81 MG tablet Take 81 mg by mouth daily. Swallow whole.   atorvastatin (LIPITOR) 40 MG tablet TAKE ONE TABLET BY MOUTH ONCE DAILY.   benzonatate (TESSALON) 200 MG capsule Take 1 capsule (200 mg total) by mouth 3 (three) times daily as needed for cough.   bumetanide (BUMEX) 1 MG tablet TAKE (1) TABLET BY MOUTH TWICE DAILY.   buPROPion (WELLBUTRIN XL) 300 MG 24 hr tablet Take 300 mg by mouth daily.   Camphor-Menthol-Methyl Sal (SALONPAS) 3.04-04-08 % PTCH Place 1 patch onto the skin daily as needed (pain.).   cholecalciferol (VITAMIN D3) 25 MCG (1000 UT) tablet Take 2 tablets (2,000 Units total) by mouth daily. (Patient not taking: Reported on 07/21/2022)   DULoxetine (CYMBALTA) 60 MG capsule 1 capsule   famotidine (PEPCID) 20 MG tablet Take 1 tablet (20 mg total) by mouth 2 (two) times daily before a meal.   fluticasone (FLONASE) 50 MCG/ACT nasal spray Place 1 spray into both nostrils See admin instructions. Instill 1 spray into each nostril every morning, may also use later in the day as needed for sinus congestion   lamoTRIgine (LAMICTAL) 200 MG tablet Take 200 mg by mouth daily.   linaclotide (LINZESS) 145 MCG CAPS capsule Take 1 capsule (145 mcg total) by mouth daily. Take 30 minutes before a meal   loratadine (CLARITIN) 10 MG tablet 1 tablet Orally Once a day   Polyvinyl Alcohol-Povidone (REFRESH OP) Place 1 drop into  both eyes 3 (three) times daily as needed (dry eyes).   Simethicone (GAS-X PO) Take 1 tablet by mouth 2 (two) times daily as needed (bloating/flatulence).   sodium chloride (OCEAN) 0.65 % nasal spray Place 1 spray into the nose 2 (two) times daily as needed for congestion.   spironolactone (ALDACTONE) 100 MG tablet TAKE 1/2 TABLET BY MOUTH TWICE DAILY.   Tofacitinib Citrate ER (XELJANZ XR) 11 MG TB24 1 tablet Orally Once a day for 30 days   XIIDRA 5 % SOLN Apply 1 drop to eye 2 (two) times daily.   No facility-administered encounter medications on file as of 10/14/2022.    ALLERGIES:  Allergies  Allergen Reactions   Omeprazole Shortness Of Breath   Orencia [Abatacept] Anaphylaxis, Hives and Rash   Other Shortness Of Breath, Rash and Other (See Comments)    Red Meat Causes shortness of breath, rash and flu like symptoms.    Phenytoin Rash    Dilantin  Other reaction(s): rash   Keflex [Cephalexin] Other (See Comments)    Severe yeast infection   Lidocaine Viscous Hcl     NAUSEA/PROLONGED NUMBNESS   Meat Extract     Other reaction(s): nauses, chills   Promethazine Hcl Other (See Comments)    Not in right state of mind, HALLUCINATION Other reaction(s): mental confusion    LABORATORY DATA:  I have reviewed the labs as listed.  CBC    Component Value Date/Time   WBC 7.6 10/06/2022 1354   RBC 4.41 10/06/2022 1354  HGB 13.6 10/06/2022 1354   HGB 11.1 01/14/2021 1155   HCT 41.3 10/06/2022 1354   HCT 33.5 (L) 01/14/2021 1155   HCT 41 07/30/2011 0743   PLT 334 10/06/2022 1354   PLT 425 01/14/2021 1155   MCV 93.7 10/06/2022 1354   MCV 82 01/14/2021 1155   MCV 104.3 07/30/2011 0743   MCH 30.8 10/06/2022 1354   MCHC 32.9 10/06/2022 1354   RDW 14.6 10/06/2022 1354   RDW 15.5 (H) 01/14/2021 1155   LYMPHSABS 1.2 10/06/2022 1354   LYMPHSABS 1.3 03/27/2019 0854   MONOABS 0.8 10/06/2022 1354   EOSABS 0.2 10/06/2022 1354   EOSABS 0.2 03/27/2019 0854   BASOSABS 0.1 10/06/2022 1354    BASOSABS 0.1 03/27/2019 0854      Latest Ref Rng & Units 08/20/2022   11:21 AM 07/30/2021   11:23 AM 01/14/2021   11:55 AM  CMP  Glucose 70 - 99 mg/dL 84  161  97   BUN 6 - 23 mg/dL 16  16  14    Creatinine 0.40 - 1.20 mg/dL 0.96  0.45  4.09   Sodium 135 - 145 mEq/L 140  138  139   Potassium 3.5 - 5.1 mEq/L 4.1  3.7  4.4   Chloride 96 - 112 mEq/L 101  103  96   CO2 19 - 32 mEq/L 31  23  24    Calcium 8.4 - 10.5 mg/dL 81.1  91.4  9.7   Total Protein 6.0 - 8.3 g/dL 7.9  8.2  7.1   Total Bilirubin 0.2 - 1.2 mg/dL 0.5  0.6  0.4   Alkaline Phos 39 - 117 U/L 97  108  90   AST 0 - 37 U/L 25  26  30    ALT 0 - 35 U/L 22  25  24      DIAGNOSTIC IMAGING:  I have independently reviewed the relevant imaging and discussed with the patient.   WRAP UP:  All questions were answered. The patient knows to call the clinic with any problems, questions or concerns.  Medical decision making: ***  Time spent on visit: I spent *** minutes counseling the patient face to face. The total time spent in the appointment was *** minutes and more than 50% was on counseling.  Carnella Guadalajara, PA-C  ***

## 2022-10-14 ENCOUNTER — Inpatient Hospital Stay (HOSPITAL_BASED_OUTPATIENT_CLINIC_OR_DEPARTMENT_OTHER): Payer: Medicare HMO | Admitting: Physician Assistant

## 2022-10-14 VITALS — BP 143/82 | HR 80 | Temp 97.7°F | Resp 18 | Wt 236.1 lb

## 2022-10-14 DIAGNOSIS — D500 Iron deficiency anemia secondary to blood loss (chronic): Secondary | ICD-10-CM | POA: Diagnosis not present

## 2022-10-14 NOTE — Patient Instructions (Signed)
 [place] [facility] [place] **   You were seen today by Tarri Abernethy PA-C for your iron deficiency anemia.   Your blood levels look great, you are not anemic at this time.  However, your iron levels are lower than they should be. You may have some small amounts of ongoing blood loss from your stomach and intestines. Your low iron levels may also be causing your current symptoms of fatigue. Will schedule you for IV iron x 3 doses.  LABS: Return in 3 months for repeat labs  FOLLOW-UP APPOINTMENT: Phone visit in 3 months, after labs  ** Thank you for trusting me with your healthcare!  I strive to provide all of my patients with quality care at each visit.  If you receive a survey for this visit, I would be so grateful to you for taking the time to provide feedback.  Thank you in advance!  ~ Rebekah                   Dr. Derek Jack   &   Tarri Abernethy, PA-C   - - - - - - - - - - - - - - - - - -    Thank you for choosing [facility] to provide your oncology and hematology care.  To afford each patient quality time with our provider, please arrive at least 15 minutes before your scheduled appointment time.   If you have a lab appointment with the [facility] please come in thru the Main Entrance and check in at the main information desk.  You need to re-schedule your appointment should you arrive 10 or more minutes late.  We strive to give you quality time with our providers, and arriving late affects you and other patients whose appointments are after yours.  Also, if you no show three or more times for appointments you may be dismissed from the clinic at the providers discretion.     Again, thank you for choosing [facility].  Our hope is that these requests will decrease the amount of time that you wait before being seen by our physicians.        _____________________________________________________________  Should you have questions after your visit to [facility], please contact our office at [phone] and follow the prompts.  Our office hours are 8:00 a.m. and 4:30 p.m. Monday - Friday.  Please note that voicemails left after 4:00 p.m. may not be returned until the following business day.  We are closed weekends and major holidays.  You do have access to a nurse 24-7, just call the main number to the clinic [phone] and do not press any options, hold on the line and a nurse will answer the phone.    For prescription refill requests, have your pharmacy contact our office and allow 72 hours.

## 2022-10-16 ENCOUNTER — Inpatient Hospital Stay: Payer: Medicare HMO

## 2022-10-16 VITALS — BP 112/72 | HR 71 | Temp 98.5°F | Resp 18

## 2022-10-16 DIAGNOSIS — D500 Iron deficiency anemia secondary to blood loss (chronic): Secondary | ICD-10-CM | POA: Diagnosis not present

## 2022-10-16 MED ORDER — SODIUM CHLORIDE 0.9 % IV SOLN
300.0000 mg | Freq: Once | INTRAVENOUS | Status: AC
  Administered 2022-10-16: 300 mg via INTRAVENOUS
  Filled 2022-10-16: qty 300

## 2022-10-16 MED ORDER — SODIUM CHLORIDE 0.9 % IV SOLN: Freq: Once | INTRAVENOUS | Status: AC

## 2022-10-16 MED ORDER — CETIRIZINE HCL 10 MG PO TABS
10.0000 mg | ORAL_TABLET | Freq: Once | ORAL | Status: AC
  Administered 2022-10-16: 10 mg via ORAL
  Filled 2022-10-16: qty 1

## 2022-10-16 NOTE — Patient Instructions (Signed)
[facility]  Discharge Instructions: Thank you for choosing [facility] to provide your oncology and hematology care.  If you have a lab appointment with the Cancer Center - please note that after April 8th, 2024, all labs will be drawn in the cancer center.  You do not have to check in or register with the main entrance as you have in the past but will complete your check-in in the cancer center.  Wear comfortable clothing and clothing appropriate for easy access to any Portacath or PICC line.   We strive to give you quality time with your provider. You may need to reschedule your appointment if you arrive late (15 or more minutes).  Arriving late affects you and other patients whose appointments are after yours.  Also, if you miss three or more appointments without notifying the office, you may be dismissed from the clinic at the provider's discretion.      For prescription refill requests, have your pharmacy contact our office and allow 72 hours for refills to be completed.    Today you received the Venofer IV iron infusion.     BELOW ARE SYMPTOMS THAT SHOULD BE REPORTED IMMEDIATELY: *FEVER GREATER THAN 100.4 F (38 C) OR HIGHER *CHILLS OR SWEATING *NAUSEA AND VOMITING THAT IS NOT CONTROLLED WITH YOUR NAUSEA MEDICATION *UNUSUAL SHORTNESS OF BREATH *UNUSUAL BRUISING OR BLEEDING *URINARY PROBLEMS (pain or burning when urinating, or frequent urination) *BOWEL PROBLEMS (unusual diarrhea, constipation, pain near the anus) TENDERNESS IN MOUTH AND THROAT WITH OR WITHOUT PRESENCE OF ULCERS (sore throat, sores in mouth, or a toothache) UNUSUAL RASH, SWELLING OR PAIN  UNUSUAL VAGINAL DISCHARGE OR ITCHING   Items with * indicate a potential emergency and should be followed up as soon as possible or go to the Emergency Department if any problems should occur.  Please show the CHEMOTHERAPY ALERT CARD or IMMUNOTHERAPY ALERT CARD at check-in to the Emergency  Department and triage nurse.  Should you have questions after your visit or need to cancel or reschedule your appointment, please contact [facility] [phone]  and follow the prompts.  Office hours are 8:00 a.m. to 4:30 p.m. Monday - Friday. Please note that voicemails left after 4:00 p.m. may not be returned until the following business day.  We are closed weekends and major holidays. You have access to a nurse at all times for urgent questions. Please call the main number to the clinic [phone] and follow the prompts.  For any non-urgent questions, you may also contact your provider using MyChart. We now offer e-Visits for anyone 27 and older to request care online for non-urgent symptoms. For details visit [email or web address].   Also download the MyChart app! Go to the app store, search "MyChart", open the app, select [place], and log in with your MyChart username and password.

## 2022-10-16 NOTE — Progress Notes (Signed)
Patient presents today for iron infusion.  Patient is in satisfactory condition with no new complaints voiced.  Vital signs are stable.  We will proceed with infusion per provider orders.   Peripheral IV started with good blood return pre and post infusion.  Treatment given today per MD orders. Tolerated infusion without adverse affects. Vital signs stable. No complaints at this time. Discharged from clinic ambulatory in stable condition. Alert and oriented x 3. F/U with [facility] as scheduled.

## 2022-10-20 ENCOUNTER — Ambulatory Visit: Payer: Medicare HMO | Admitting: Family Medicine

## 2022-10-23 ENCOUNTER — Inpatient Hospital Stay: Payer: Medicare HMO

## 2022-10-29 MED FILL — Iron Sucrose Inj 20 MG/ML (Fe Equiv): INTRAVENOUS | Qty: 15 | Status: AC

## 2022-10-30 ENCOUNTER — Inpatient Hospital Stay: Payer: Medicare HMO | Attending: Hematology

## 2022-10-30 VITALS — BP 136/74 | HR 71 | Temp 98.3°F | Resp 17

## 2022-10-30 DIAGNOSIS — K31811 Angiodysplasia of stomach and duodenum with bleeding: Secondary | ICD-10-CM | POA: Insufficient documentation

## 2022-10-30 DIAGNOSIS — Z79899 Other long term (current) drug therapy: Secondary | ICD-10-CM | POA: Diagnosis not present

## 2022-10-30 DIAGNOSIS — D500 Iron deficiency anemia secondary to blood loss (chronic): Secondary | ICD-10-CM | POA: Insufficient documentation

## 2022-10-30 MED ORDER — SODIUM CHLORIDE 0.9 % IV SOLN
300.0000 mg | Freq: Once | INTRAVENOUS | Status: AC
  Administered 2022-10-30: 300 mg via INTRAVENOUS
  Filled 2022-10-30: qty 300

## 2022-10-30 MED ORDER — SODIUM CHLORIDE 0.9 % IV SOLN: Freq: Once | INTRAVENOUS | Status: AC

## 2022-10-30 NOTE — Progress Notes (Signed)
Patient presents today for iron infusion.  Patient is in satisfactory condition with no new complaints voiced.  Vital signs are stable.  IV placed in left AC.  IV flushed well with good blood return noted.  Tylenol and Zyrtec taken at 0900 at home prior to visit.  We will proceed with infusion per provider orders.    Patient tolerated infusion well with no complaints voiced.  Patient left ambulatory in stable condition.  Vital signs stable at discharge.  Follow up as scheduled.

## 2022-10-30 NOTE — Patient Instructions (Signed)
[facility]  Discharge Instructions: Thank you for choosing [facility] to provide your oncology and hematology care.  If you have a lab appointment with the Cancer Center - please note that after April 8th, 2024, all labs will be drawn in the cancer center.  You do not have to check in or register with the main entrance as you have in the past but will complete your check-in in the cancer center.  Wear comfortable clothing and clothing appropriate for easy access to any Portacath or PICC line.   We strive to give you quality time with your provider. You may need to reschedule your appointment if you arrive late (15 or more minutes).  Arriving late affects you and other patients whose appointments are after yours.  Also, if you miss three or more appointments without notifying the office, you may be dismissed from the clinic at the provider's discretion.      For prescription refill requests, have your pharmacy contact our office and allow 72 hours for refills to be completed.    Today you received the following:  Venofer.  Iron Sucrose Injection What is this medication? IRON SUCROSE (EYE ern SOO krose) treats low levels of iron (iron deficiency anemia) in people with kidney disease. Iron is a mineral that plays an important role in making red blood cells, which carry oxygen from your lungs to the rest of your body. This medicine may be used for other purposes; ask your health care provider or pharmacist if you have questions. COMMON BRAND NAME(S): Venofer What should I tell my care team before I take this medication? They need to know if you have any of these conditions: Anemia not caused by low iron levels Heart disease High levels of iron in the blood Kidney disease Liver disease An unusual or allergic reaction to iron, other medications, foods, dyes, or preservatives Pregnant or trying to get pregnant Breastfeeding How should I use this  medication? This medication is for infusion into a vein. It is given in a hospital or clinic setting. Talk to your care team about the use of this medication in children. While this medication may be prescribed for children as young as 2 years for selected conditions, precautions do apply. Overdosage: If you think you have taken too much of this medicine contact a poison control center or emergency room at once. NOTE: This medicine is only for you. Do not share this medicine with others. What if I miss a dose? Keep appointments for follow-up doses. It is important not to miss your dose. Call your care team if you are unable to keep an appointment. What may interact with this medication? Do not take this medication with any of the following: Deferoxamine Dimercaprol Other iron products This medication may also interact with the following: Chloramphenicol Deferasirox This list may not describe all possible interactions. Give your health care provider a list of all the medicines, herbs, non-prescription drugs, or dietary supplements you use. Also tell them if you smoke, drink alcohol, or use illegal drugs. Some items may interact with your medicine. What should I watch for while using this medication? Visit your care team regularly. Tell your care team if your symptoms do not start to get better or if they get worse. You may need blood work done while you are taking this medication. You may need to follow a special diet. Talk to your care team. Foods that contain iron include: whole grains/cereals, dried fruits, beans,  or peas, leafy green vegetables, and organ meats (liver, kidney). What side effects may I notice from receiving this medication? Side effects that you should report to your care team as soon as possible: Allergic reactions--skin rash, itching, hives, swelling of the face, lips, tongue, or throat Low blood pressure--dizziness, feeling faint or lightheaded, blurry vision Shortness of  breath Side effects that usually do not require medical attention (report to your care team if they continue or are bothersome): Flushing Headache Joint pain Muscle pain Nausea Pain, redness, or irritation at injection site This list may not describe all possible side effects. Call your doctor for medical advice about side effects. You may report side effects to FDA at 1-800-FDA-1088. Where should I keep my medication? This medication is given in a hospital or clinic. It will not be stored at home. NOTE: This sheet is a summary. It may not cover all possible information. If you have questions about this medicine, talk to your doctor, pharmacist, or health care provider.  2024 Elsevier/Gold Standard (2022-08-21 00:00:00)     To help prevent nausea and vomiting after your treatment, we encourage you to take your nausea medication as directed.  BELOW ARE SYMPTOMS THAT SHOULD BE REPORTED IMMEDIATELY: *FEVER GREATER THAN 100.4 F (38 C) OR HIGHER *CHILLS OR SWEATING *NAUSEA AND VOMITING THAT IS NOT CONTROLLED WITH YOUR NAUSEA MEDICATION *UNUSUAL SHORTNESS OF BREATH *UNUSUAL BRUISING OR BLEEDING *URINARY PROBLEMS (pain or burning when urinating, or frequent urination) *BOWEL PROBLEMS (unusual diarrhea, constipation, pain near the anus) TENDERNESS IN MOUTH AND THROAT WITH OR WITHOUT PRESENCE OF ULCERS (sore throat, sores in mouth, or a toothache) UNUSUAL RASH, SWELLING OR PAIN  UNUSUAL VAGINAL DISCHARGE OR ITCHING   Items with * indicate a potential emergency and should be followed up as soon as possible or go to the Emergency Department if any problems should occur.  Please show the CHEMOTHERAPY ALERT CARD or IMMUNOTHERAPY ALERT CARD at check-in to the Emergency Department and triage nurse.  Should you have questions after your visit or need to cancel or reschedule your appointment, please contact [facility] [phone]  and follow the prompts.  Office hours are  8:00 a.m. to 4:30 p.m. Monday - Friday. Please note that voicemails left after 4:00 p.m. may not be returned until the following business day.  We are closed weekends and major holidays. You have access to a nurse at all times for urgent questions. Please call the main number to the clinic [phone] and follow the prompts.  For any non-urgent questions, you may also contact your provider using MyChart. We now offer e-Visits for anyone 55 and older to request care online for non-urgent symptoms. For details visit [email or web address].   Also download the MyChart app! Go to the app store, search "MyChart", open the app, select [facility], and log in with your MyChart username and password.

## 2022-11-11 ENCOUNTER — Encounter: Payer: Self-pay | Admitting: Internal Medicine

## 2022-11-13 ENCOUNTER — Inpatient Hospital Stay: Payer: Medicare HMO

## 2022-11-13 VITALS — BP 130/70 | HR 74 | Temp 97.8°F | Resp 18

## 2022-11-13 DIAGNOSIS — D500 Iron deficiency anemia secondary to blood loss (chronic): Secondary | ICD-10-CM

## 2022-11-13 MED ORDER — SODIUM CHLORIDE 0.9 % IV SOLN: Freq: Once | INTRAVENOUS | Status: AC

## 2022-11-13 MED ORDER — SODIUM CHLORIDE 0.9 % IV SOLN
300.0000 mg | Freq: Once | INTRAVENOUS | Status: AC
  Administered 2022-11-13: 300 mg via INTRAVENOUS
  Filled 2022-11-13: qty 300

## 2022-11-13 NOTE — Progress Notes (Signed)
Patient tolerated IV iron infusion well, without complication. Discharged in stable condition, ambulatory to lobby.

## 2022-11-18 ENCOUNTER — Other Ambulatory Visit: Payer: Self-pay | Admitting: Family Medicine

## 2022-12-04 ENCOUNTER — Encounter: Payer: Self-pay | Admitting: Physician Assistant

## 2022-12-04 ENCOUNTER — Ambulatory Visit: Payer: Medicare HMO | Attending: Physician Assistant | Admitting: Physician Assistant

## 2022-12-04 VITALS — BP 124/82 | HR 79 | Ht 68.0 in | Wt 234.0 lb

## 2022-12-04 DIAGNOSIS — I5022 Chronic systolic (congestive) heart failure: Secondary | ICD-10-CM | POA: Diagnosis not present

## 2022-12-04 DIAGNOSIS — I2510 Atherosclerotic heart disease of native coronary artery without angina pectoris: Secondary | ICD-10-CM

## 2022-12-04 DIAGNOSIS — I447 Left bundle-branch block, unspecified: Secondary | ICD-10-CM

## 2022-12-04 DIAGNOSIS — I671 Cerebral aneurysm, nonruptured: Secondary | ICD-10-CM

## 2022-12-04 DIAGNOSIS — I10 Essential (primary) hypertension: Secondary | ICD-10-CM | POA: Diagnosis not present

## 2022-12-04 DIAGNOSIS — E785 Hyperlipidemia, unspecified: Secondary | ICD-10-CM

## 2022-12-04 DIAGNOSIS — I2584 Coronary atherosclerosis due to calcified coronary lesion: Secondary | ICD-10-CM

## 2022-12-04 NOTE — Progress Notes (Addendum)
Cardiology Office Note    Date:  12/04/2022  ID:  Deanna Thompson, DOB [birth year], MRN [id number] PCP:  Tommie Sams, DO  Cardiologist:  Dina Rich, MD then Dr. Peter Swaziland at last visit Electrophysiologist:  None   Chief Complaint: f/u CHF  History of Present Illness: .    Deanna Thompson is a 67 y.o. 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  She was remotely followed by Dr. Algie Coffer. She established with Dr. Wyline Mood in 2019. Prior echo 07/2017 showed EF 45-50%, diffuse HK, G1DD. She had had a stroke in 07/2017 with brain CTA showing prior basilar apex aneurysm, also a 3mm untreated L MCA aneurysm, short-segment severe distal left V2 stenosis, atherosclerotic carotid disease without high grade stenosis. She was started on ASA and statin. She was advised to f/u with neurology and Dr. Corliss Skains. F/u nuc for her cardiomyopathy in 11/2017 was compromised by gut uptake, managed medically at the time. Repeat echo 2022 showed continued LVEF 45-50% with global HK, aortic sclerosis without stenosis, no AI. Prior CT scans also mentioned aortic and coronary calcification. ABIs 03/2021 were normal. She then saw Dr. Swaziland in 11/2021 for follow-up. He felt she had mild LV dysfunction primarily related to dyssynergy with her LBBB. He reviewed the nuc and felt there was normal perfusion on prior Myoview study. She was continued with medical therapy. Her prior volume issus were felt more related to cirrhosis than HF. She did have multiple paracenteses in 2014. Med management was limited by orthostasis in  the past. She reports losartan previously had to be stopped due to profoundly low BP.  She returns for follow-up overall doing well from cardiac standpoint. She notes chronic unchanged fatigue which she feels may be multifactorial. She denies any chest pain or dyspnea. She has re-introduced walking recently. She has not had any issues with edema recently. She does report h/o chronic episodic dizziness related to BPPV. She follows with Dr. Leonides Schanz for GI. Her cirrhosis has stabilized and she has done well since she quit drinking remotely.  Labwork independently reviewed: 09/2022 Hgb 13.6, plt 334 Scan 08/2022 Hgb 13.4, plt OK, CR 1.23, GFR 49, K 4.2, Na 142, LFTs ok 2022 LDL 59  ROS: .    Please see the history of present illness.  All other systems are reviewed and otherwise negative.  Studies Reviewed: Marland Kitchen    EKG:  EKG is ordered today, personally reviewed, demonstrating NSR 79bpm, LBBBB, nonspecific TW changes. Axis has shifted right solely based on avF appearance. Otherwise not acutely changed.  CV Studies: Cardiac studies reviewed are outlined and summarized above. Otherwise please see EMR for full report.   Current Reported Medications:.    Current Meds  Medication Sig   acetaminophen (TYLENOL) 500 MG tablet Take 500 mg by mouth every 6 (six) hours as needed.   aspirin EC 81 MG tablet Take 81 mg by mouth daily. Swallow whole.   atorvastatin (LIPITOR) 40 MG tablet TAKE ONE TABLET BY MOUTH ONCE DAILY.   benzonatate (TESSALON) 200 MG capsule Take 1 capsule (200 mg total) by mouth 3 (three) times daily as needed for cough.   bumetanide (BUMEX) 1 MG tablet  TAKE (1) TABLET BY MOUTH TWICE DAILY.   buPROPion (WELLBUTRIN XL) 300 MG 24 hr tablet Take 300 mg by mouth daily.   Camphor-Menthol-Methyl Sal (SALONPAS) 3.04-04-08 % PTCH Place 1 patch onto the skin daily as needed (pain.).   cholecalciferol (VITAMIN D3) 25 MCG (1000 UT) tablet Take 2 tablets (2,000 Units total) by mouth daily.    DULoxetine (CYMBALTA) 60 MG capsule 1 capsule   famotidine (PEPCID) 20 MG tablet Take 1 tablet (20 mg total) by mouth 2 (two) times daily before a meal.   fluticasone (FLONASE) 50 MCG/ACT nasal spray Place 1 spray into both nostrils See admin instructions. Instill 1 spray into each nostril every morning, may also use later in the day as needed for sinus congestion   lamoTRIgine (LAMICTAL) 200 MG tablet Take 400 mg by mouth daily.   linaclotide (LINZESS) 145 MCG CAPS capsule Take 1 capsule (145 mcg total) by mouth daily. Take 30 minutes before a meal   loratadine (CLARITIN) 10 MG tablet 1 tablet Orally Once a day   Polyvinyl Alcohol-Povidone (REFRESH OP) Place 1 drop into both eyes 3 (three) times daily as needed (dry eyes).   Simethicone (GAS-X PO) Take 1 tablet by mouth 2 (two) times daily as needed (bloating/flatulence).   sodium chloride (OCEAN) 0.65 % nasal spray Place 1 spray into the nose 2 (two) times daily as needed for congestion.   spironolactone (ALDACTONE) 100 MG tablet TAKE 1/2 TABLET BY MOUTH TWICE DAILY.   Tofacitinib Citrate ER (XELJANZ XR) 11 MG TB24 1 tablet Orally Once a day for 30 days   XIIDRA 5 % SOLN Apply 1 drop to eye 2 (two) times daily.    Physical Exam:    VS:  BP 124/82 (BP Location: Right Arm, Patient Position: Sitting, Cuff Size: Normal)   Pulse 79   Ht 5\' 8"  (1.727 m)   Wt 234 lb (106.1 kg)   SpO2 97%   BMI 35.58 kg/m    Wt Readings from Last 3 Encounters:  12/04/22 234 lb (106.1 kg)  10/14/22 236 lb 1.8 oz (107.1 kg)  08/20/22 238 lb (108 kg)    GEN: Well nourished, well developed in no acute distress NECK: No JVD; No carotid bruits CARDIAC: RRR, no murmurs, rubs, gallops RESPIRATORY:  Clear to auscultation without rales, wheezing or rhonchi  ABDOMEN: Soft, non-tender, non-distended EXTREMITIES:  No edema; No acute deformity   Asessement and Plan:.    1. Chronic HFmrEF - euvolemic on exam. Etiology of cardiomyopathy has not been elucidated,  possibly related to LBBB. Nuclear stress test 2019 confounded by gut uptake. This was managed as a low risk scan by last OV. She is not describing any accelerating anginal symptoms so continued medical therapy is appropriate if EF is stable. Will update echocardiogram. She does note chronic fatigue that could be multifactorial. We'll get TSH with labs when she returns for echo. I asked her to keep a log of her BP 3x/week so we can check in with her how this is running when echo results. We discussed possible addition of SGLT2i. She does not wish to add anything new today but is open to the idea of titration of medical therapy if echo shows any progression. She is not on losartan as she had previously experienced hypotension with this, but will follow up echo to help guide additional (cautious) medication recommendations. Will plan 1 year follow-up, sooner if echo abnormal.  2. LBBB - chronic finding for patient. Need to keep an eye on  LVEF (getting updated echo) in case her cardiomyopathy is related to LBBB. No bradycardia noted.  3. Coronary calcification, carotid artery atherosclerosis - this has been managed medically in the absence of accelerating symptoms. Continue ASA, atorvastatin. Will get updated CMET/lipid profile when she returns for echo. Await echo as above.  4. HTN - BP controlled on present regimen to include Bumex and spironolactone.  5. H/o cerebral aneurysm - I also asked about the follow-up for her cerebral aneurysm. She said that she recalls being told that if the residual one was <93mm it did not need treatment but she has not had any surveillance recently. I encouraged her to review with Dr. Corliss Skains. If she does not have updated cerebral vascular testing by their team between now and next visit, would obtain f/u surveillance carotid doppler at next visit given mild atherosclerosis seen on prior CT. No carotid bruits or interim stroke symptoms.     Disposition: F/u with me per  patient request in 1 year, sooner if echo abnormal.   Signed, Laurann Montana, PA-C

## 2022-12-04 NOTE — Patient Instructions (Addendum)
Medication Instructions:  Your physician recommends that you continue on your current medications as directed. Please refer to the Current Medication list given to you today.  Check Blood Pressure 3 Times Weekly and keep log.   *If you need a refill on your cardiac medications before your next appointment, please call your pharmacy*   Lab Work: Your physician recommends that you return for lab work in: Fasting   If you have labs (blood work) drawn today and your tests are completely normal, you will receive your results only by: MyChart Message (if you have MyChart) OR A paper copy in the mail If you have any lab test that is abnormal or we need to change your treatment, we will call you to review the results.   Testing/Procedures: Your physician has requested that you have an echocardiogram. Echocardiography is a painless test that uses sound waves to create images of your heart. It provides your doctor with information about the size and shape of your heart and how well your heart's chambers and valves are working. This procedure takes approximately one hour. There are no restrictions for this procedure. Please do NOT wear cologne, perfume, aftershave, or lotions (deodorant is allowed). Please arrive 15 minutes prior to your appointment time.    Follow-Up: At [facility], you and your health needs are our priority.  As part of our continuing mission to provide you with exceptional heart care, we have created designated Provider Care Teams.  These Care Teams include your primary Cardiologist (physician) and Advanced Practice Providers (APPs -  Physician Assistants and Nurse Practitioners) who all work together to provide you with the care you need, when you need it.  We recommend signing up for the patient portal called "MyChart".  Sign up information is provided on this After Visit Summary.  MyChart is used to connect with patients for Virtual Visits (Telemedicine).  Patients  are able to view lab/test results, encounter notes, upcoming appointments, etc.  Non-urgent messages can be sent to your provider as well.   To learn more about what you can do with MyChart, go to [email or web address].    Your next appointment:   1 year(s)  Provider:   Ronie Spies, PA      Other Instructions Thank you for choosing [facility]!

## 2022-12-08 ENCOUNTER — Encounter: Payer: Self-pay | Admitting: Emergency Medicine

## 2022-12-08 ENCOUNTER — Telehealth: Payer: Medicare HMO | Admitting: Physician Assistant

## 2022-12-08 ENCOUNTER — Ambulatory Visit
Admission: EM | Admit: 2022-12-08 | Discharge: 2022-12-08 | Disposition: A | Discharge status: Home / Self Care | Payer: Medicare HMO | Attending: Nurse Practitioner | Admitting: Nurse Practitioner

## 2022-12-08 DIAGNOSIS — R070 Pain in throat: Secondary | ICD-10-CM | POA: Diagnosis present

## 2022-12-08 DIAGNOSIS — J029 Acute pharyngitis, unspecified: Secondary | ICD-10-CM | POA: Diagnosis not present

## 2022-12-08 LAB — POCT RAPID STREP A (OFFICE): Rapid Strep A Screen: NEGATIVE

## 2022-12-08 MED ORDER — NYSTATIN 100000 UNIT/ML MT SUSP: 5.0000 mL | Freq: Three times a day (TID) | OROMUCOSAL | 0 refills | Status: AC | PRN

## 2022-12-08 NOTE — Progress Notes (Signed)
?  E-Visit for Sore Throat ? ?We are sorry that you are not feeling well.  Here is how we plan to help! ? ?Your symptoms indicate a likely viral infection (Pharyngitis).   Pharyngitis is inflammation in the back of the throat which can cause a sore throat, scratchiness and sometimes difficulty swallowing.   Pharyngitis is typically caused by a respiratory virus and will just run its course.  Please keep in mind that your symptoms could last up to 10 days.  For throat pain, we recommend over the counter oral pain relief medications such as acetaminophen or aspirin, or anti-inflammatory medications such as ibuprofen or naproxen sodium.  Topical treatments such as oral throat lozenges or sprays may be used as needed.  Avoid close contact with loved ones, especially the very young and elderly.  Remember to wash your hands thoroughly throughout the day as this is the number one way to prevent the spread of infection and wipe down door knobs and counters with disinfectant. ? ?After careful review of your answers, I would not recommend an antibiotic for your condition.  Antibiotics should not be used to treat conditions that we suspect are caused by viruses like the virus that causes the common cold or flu. However, some people can have Strep with atypical symptoms. You may need formal testing in clinic or office to confirm if your symptoms continue or worsen. ? ?Providers prescribe antibiotics to treat infections caused by bacteria. Antibiotics are very powerful in treating bacterial infections when they are used properly.  To maintain their effectiveness, they should be used only when necessary.  Overuse of antibiotics has resulted in the development of super bugs that are resistant to treatment!   ? ?Home Care: ?Only take medications as instructed by your medical team. ?Do not drink alcohol while taking these medications. ?A steam or ultrasonic humidifier can help congestion.  You can place a towel over your head and  breathe in the steam from hot water coming from a faucet. ?Avoid close contacts especially the very young and the elderly. ?Cover your mouth when you cough or sneeze. ?Always remember to wash your hands. ? ?Get Help Right Away If: ?You develop worsening fever or throat pain. ?You develop a severe head ache or visual changes. ?Your symptoms persist after you have completed your treatment plan. ? ?Make sure you ?Understand these instructions. ?Will watch your condition. ?Will get help right away if you are not doing well or get worse. ? ? ?Thank you for choosing an e-visit. ? ?Your e-visit answers were reviewed by a board certified advanced clinical practitioner to complete your personal care plan. Depending upon the condition, your plan could have included both over the counter or prescription medications. ? ?Please review your pharmacy choice. Make sure the pharmacy is open so you can pick up prescription now. If there is a problem, you may contact your provider through MyChart messaging and have the prescription routed to another pharmacy.  Your safety is important to us. If you have drug allergies check your prescription carefully.  ? ?For the next 24 hours you can use MyChart to ask questions about today's visit, request a non-urgent call back, or ask for a work or school excuse. ?You will get an email in the next two days asking about your experience. I hope that your e-visit has been valuable and will speed your recovery. ? ?

## 2022-12-08 NOTE — Progress Notes (Signed)
I have spent 5 minutes in review of e-visit questionnaire, review and updating patient chart, medical decision making and response to patient.   William Cody Martin, PA-C    

## 2022-12-08 NOTE — Discharge Instructions (Signed)
The rapid strep test was negative.  A throat culture is pending.  You will be contacted if the pending test results are positive.  You will also have access to your results via MyChart. Take medication as prescribed. May take over-the-counter Tylenol as needed for pain, fever, or general discomfort. Warm salt water gargles 3-4 times daily as needed for throat pain or discomfort. May use over-the-counter Chloraseptic throat spray as needed for throat pain or discomfort. Increase fluids and allow for plenty of rest. If symptoms do not improve over the next 5 to 7 days, or worsen before that time, please follow-up in this clinic or with your primary care physician for further evaluation. Follow-up as needed.

## 2022-12-08 NOTE — ED Provider Notes (Signed)
[facility]    CSN: [id number] Arrival date & time: 12/08/22  1532      History   Chief Complaint No chief complaint on file.   HPI Deanna Thompson is a 67 y.o. female.   The history is provided by the patient.   Patient presents for complaints of soreness on the left side of her mouth/throat has been present for the past 3 days.  Patient denies fever, chills, nasal congestion, runny nose, cough, abdominal pain, nausea, vomiting, diarrhea, or rash.  Patient reports before her symptoms started, she has been dealing with a viral upper respiratory infection.  Patient was concerned because the pain interrupted her sleep last evening.  She has not taken any medication for her symptoms.  Past Medical History:  Diagnosis Date   Abdominal hernia    repaired   Abscess of pulp of tooth 12/12/2014   left side   Anemia    Anxiety    Arthritis    back  and down flank  right side more than left   B12 deficiency 06/12/2015   Brain aneurysm    METAL COIL PREVENTS MRI IMAGING and CLIP   Cirrhosis (HCC)    Presumably alcoholic cirrhosis   Complication of anesthesia    Depression    ETOH abuse    Quit in 07/2011    Fatigue    due to Sjogren's disease   Flat foot (pes planus) (acquired), left foot    Folate deficiency 06/30/2016   GERD (gastroesophageal reflux disease)    HTN (hypertension)    no longer medicated, dated on 04/25/12   IBS (irritable bowel syndrome)    Iron deficiency anemia due to chronic blood loss    Myofascial pain    Panic attack    PONV (postoperative nausea and vomiting)    Psoriatic arthritis (HCC)    Right inguinal hernia 08/16/2012   Seizures (HCC) 1997   1 seizure from coil insertion but no more seizures and no meds for seizures   Sjogren's disease (HCC)    soft Bipolar disorder (HCC)    Stroke (HCC) 2019   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   Qualifier: Diagnosis of  By: Romeo Apple MD, Duffy Rhody     TIA (transient ischemic attack)     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(HCC)    Vertigo     Patient Active Problem List   Diagnosis Date Noted   Chronic CHF (congestive heart failure) (HCC) 06/16/2022   Incisional hernia, without obstruction or gangrene 11/10/2021   Bipolar disorder (HCC) 01/13/2021   Hyperlipidemia 01/13/2021   IBS (irritable bowel syndrome)    History of stroke 08/01/2017   Essential hypertension 07/31/2017   GERD (gastroesophageal reflux disease) 07/31/2017   Brain aneurysm 07/31/2017   CKD (chronic kidney disease), stage III (HCC) 07/31/2017   LBBB (left bundle branch block) 07/31/2017   Psoriatic arthritis (HCC) 04/15/2015   Cirrhosis of liver (HCC) 09/19/2014   Iron deficiency anemia due to chronic blood loss    Insomnia 03/17/2014    Past Surgical History:  Procedure Laterality Date   AGILE CAPSULE N/A 06/21/2014   Procedure: AGILE CAPSULE;  Surgeon: West Bali, MD;  [facility];  Service: Endoscopy;  Laterality: N/A;  0700   BIOPSY  10/13/2011   SLF: mild gastritis/duodenal polypoid lesion    BIOPSY  06/04/2020   Procedure: BIOPSY;  Surgeon: Lanelle Bal, DO;  [facility];  Service: Endoscopy;;   CEREBRAL ANEURYSM REPAIR  1997  CHOLECYSTECTOMY N/A 10/28/2018   Procedure: LAPAROSCOPIC CHOLECYSTECTOMY WITH INTRAOPERATIVE CHOLANGIOGRAM;  Surgeon: Lucretia Roers, MD;  Location: [facility];  Service: General;  Laterality: N/A;   COLONOSCOPY  10/13/2011   WUJ:WJXBJYNW hemorrhoids/varices rectal/lesion in ascending colon(HYPERPLASTIC POLYP)   COLONOSCOPY N/A 01/17/2013   SLF: 1. Internal hemorrhoids 2. Ascending colon polyps 3.  Rectal varices.    COLONOSCOPY N/A 08/10/2016   Dr. Darrick Penna: redundant left colon. external and internal hemorhroids. 5-10 year surveillance   ESOPHAGOGASTRODUODENOSCOPY N/A 06/29/2014   SLF: 1. anemia due to polypoid lesions in teh antrum, and duodenal polyps 2. single duodenum AVM   ESOPHAGOGASTRODUODENOSCOPY (EGD) WITH PROPOFOL N/A  03/07/2019   Procedure: ESOPHAGOGASTRODUODENOSCOPY (EGD) WITH PROPOFOL;  Surgeon: West Bali, MD;  [facility];  Service: Endoscopy;  Laterality: N/A;  1:00pm   ESOPHAGOGASTRODUODENOSCOPY (EGD) WITH PROPOFOL N/A 03/16/2019   Procedure: ESOPHAGOGASTRODUODENOSCOPY (EGD) WITH PROPOFOL;  Surgeon: West Bali, MD;  [facility];  Service: Endoscopy;  Laterality: N/A;   ESOPHAGOGASTRODUODENOSCOPY (EGD) WITH PROPOFOL N/A 06/04/2020   Procedure: ESOPHAGOGASTRODUODENOSCOPY (EGD) WITH PROPOFOL;  Surgeon: Lanelle Bal, DO;  [facility];  Service: Endoscopy;  Laterality: N/A;  am   FLEXIBLE SIGMOIDOSCOPY N/A 01/21/2016   Procedure: FLEXIBLE SIGMOIDOSCOPY;  Surgeon: West Bali, MD;  [facility];  Service: Endoscopy;  Laterality: N/A;  245   GIVENS CAPSULE STUDY N/A 06/29/2014   Procedure: GIVENS CAPSULE STUDY;  Surgeon: West Bali, MD;  [facility];  Service: Endoscopy;  Laterality: N/A;   GIVENS CAPSULE STUDY N/A 07/13/2014   Procedure: GIVENS CAPSULE STUDY;  Surgeon: West Bali, MD;  [facility];  Service: Endoscopy;  Laterality: N/A;  700   HEMORRHOID BANDING N/A 01/21/2016   Procedure: HEMORRHOID BANDING;  Surgeon: West Bali, MD;  [facility];  Service: Endoscopy;  Laterality: N/A;   HEMOSTASIS CLIP PLACEMENT  03/16/2019   Procedure: HEMOSTASIS CLIP PLACEMENT;  Surgeon: West Bali, MD;  [facility];  Service: Endoscopy;;  x 4   HEMOSTASIS CONTROL  03/16/2019   Procedure: HEMOSTASIS CONTROL;  Surgeon: West Bali, MD;  [facility];  Service: Endoscopy;;  injection with epinephine 0.1mg /ml via scope and bipolar cautery with gold probe   INGUINAL HERNIA REPAIR Right 08/28/2013   Procedure: RIGHT NGUINAL HERNIORRHAPHY WITH MESH;  Surgeon: Dalia Heading, MD;  [facility];  Service: General;  Laterality: Right;   INSERTION OF MESH N/A 06/09/2013   Procedure: INSERTION  OF MESH;  Surgeon: Dalia Heading, MD;  Location: [facility];  Service: General;  Laterality: N/A;   INSERTION OF MESH Right 08/28/2013   Procedure: INSERTION OF MESH;  Surgeon: Dalia Heading, MD;  Location: [facility];  Service: General;  Laterality: Right;   PARACENTESIS  Feb 2014   10.7 liters   TOOTH EXTRACTION Left 12/12/14   UMBILICAL HERNIA REPAIR N/A 06/09/2013   Procedure: UMBILICAL HERNIORRHAPHY WITH MESH;  Surgeon: Dalia Heading, MD;  Location: [facility];  Service: General;  Laterality: N/A;    OB History     Gravida  0   Para  0   Term  0   Preterm  0   AB  0   Living  0      SAB  0   IAB  0   Ectopic  0   Multiple  0   Live Births  0  Home Medications    Prior to Admission medications   Medication Sig Start Date End Date Taking? Authorizing Provider  magic mouthwash (nystatin, hydrocortisone, diphenhydrAMINE) suspension Swish and swallow 5 mLs 3 (three) times daily as needed for up to 7 days for mouth pain. 12/08/22 12/15/22 Yes Jacorie Ernsberger-Warren, Sadie Haber, NP  acetaminophen (TYLENOL) 500 MG tablet Take 500 mg by mouth every 6 (six) hours as needed.    [provider]  aspirin EC 81 MG tablet Take 81 mg by mouth daily. Swallow whole.    [provider]  atorvastatin (LIPITOR) 40 MG tablet TAKE ONE TABLET BY MOUTH ONCE DAILY. 08/14/22   Tommie Sams, DO  benzonatate (TESSALON) 200 MG capsule Take 1 capsule (200 mg total) by mouth 3 (three) times daily as needed for cough. 03/16/22   Cook, Verdis Frederickson, DO  bumetanide (BUMEX) 1 MG tablet TAKE (1) TABLET BY MOUTH TWICE DAILY. 11/20/22   Tommie Sams, DO  buPROPion (WELLBUTRIN XL) 300 MG 24 hr tablet Take 300 mg by mouth daily. 04/25/22   [provider]  Camphor-Menthol-Methyl Sal (SALONPAS) 3.04-04-08 % PTCH Place 1 patch onto the skin daily as needed (pain.).    [provider]  cholecalciferol (VITAMIN D3) 25 MCG (1000 UT) tablet Take 2 tablets (2,000 Units total) by mouth daily.  12/14/18   Fields, Darleene Cleaver, MD  DULoxetine (CYMBALTA) 60 MG capsule 1 capsule    [provider]  famotidine (PEPCID) 20 MG tablet Take 1 tablet (20 mg total) by mouth 2 (two) times daily before a meal. 10/17/19   Clearance Coots, Gwendalyn Ege, PA-C  fluticasone (FLONASE) 50 MCG/ACT nasal spray Place 1 spray into both nostrils See admin instructions. Instill 1 spray into each nostril every morning, may also use later in the day as needed for sinus congestion    [provider]  lamoTRIgine (LAMICTAL) 200 MG tablet Take 400 mg by mouth daily. 02/06/21   [provider]  linaclotide Karlene Einstein) 145 MCG CAPS capsule Take 1 capsule (145 mcg total) by mouth daily. Take 30 minutes before a meal 08/20/22   Imogene Burn, MD  loratadine (CLARITIN) 10 MG tablet 1 tablet Orally Once a day    [provider]  Polyvinyl Alcohol-Povidone (REFRESH OP) Place 1 drop into both eyes 3 (three) times daily as needed (dry eyes).    [provider]  Simethicone (GAS-X PO) Take 1 tablet by mouth 2 (two) times daily as needed (bloating/flatulence).    [provider]  sodium chloride (OCEAN) 0.65 % nasal spray Place 1 spray into the nose 2 (two) times daily as needed for congestion.    [provider]  spironolactone (ALDACTONE) 100 MG tablet TAKE 1/2 TABLET BY MOUTH TWICE DAILY. 08/03/22   Tommie Sams, DO  Tofacitinib Citrate ER (XELJANZ XR) 11 MG TB24 1 tablet Orally Once a day for 30 days    [provider]  XIIDRA 5 % SOLN Apply 1 drop to eye 2 (two) times daily. 09/08/21   [provider]    Family History Family History  Problem Relation Age of Onset   Cirrhosis Mother        nash   COPD Father    Heart disease Father    Asthma Other    Heart disease Other    Colon cancer Neg Hx    Inflammatory bowel disease Neg Hx    Stomach cancer Neg Hx    Esophageal cancer Neg Hx  Social History Social History   Tobacco Use   Smoking status:  Former    Current packs/day: 0.00    Average packs/day: 2.0 packs/day for 12.0 years (24.0 ttl pk-yrs)    Types: Cigarettes    Start date: 03/31/1983    Quit date: 03/31/1995    Years since quitting: 27.7   Smokeless tobacco: Never   Tobacco comments:    quit in 1997  Vaping Use   Vaping status: Never Used  Substance Use Topics   Alcohol use: No    Comment: Bottle of wine a day for years, but quit in 07/2011   Drug use: No     Allergies   Omeprazole, Orencia [abatacept], Other, Phenytoin, Keflex [cephalexin], Lidocaine viscous hcl, Meat extract, and Promethazine hcl   Review of Systems Review of Systems Per HPI  Physical Exam Triage Vital Signs ED Triage Vitals  Encounter Vitals Group     BP 12/08/22 1732 101/67     Systolic BP Percentile --      Diastolic BP Percentile --      Pulse Rate 12/08/22 1732 84     Resp 12/08/22 1732 18     Temp 12/08/22 1732 98.6 F (37 C)     Temp Source 12/08/22 1732 Oral     SpO2 12/08/22 1732 96 %     Weight --      Height --      Head Circumference --      Peak Flow --      Pain Score 12/08/22 1733 3     Pain Loc --      Pain Education --      Exclude from Growth Chart --    No data found.  Updated Vital Signs BP 101/67 (BP Location: Right Arm)   Pulse 84   Temp 98.6 F (37 C) (Oral)   Resp 18   SpO2 96%   Visual Acuity Right Eye Distance:   Left Eye Distance:   Bilateral Distance:    Right Eye Near:   Left Eye Near:    Bilateral Near:     Physical Exam Vitals and nursing note reviewed.  Constitutional:      General: She is not in acute distress.    Appearance: Normal appearance.  HENT:     Head: Normocephalic.     Right Ear: Tympanic membrane, ear canal and external ear normal.     Left Ear: Tympanic membrane, ear canal and external ear normal.     Nose: Nose normal.     Right Turbinates: Enlarged and swollen.     Left Turbinates: Enlarged and swollen.     Right Sinus: No maxillary sinus tenderness or  frontal sinus tenderness.     Left Sinus: No maxillary sinus tenderness or frontal sinus tenderness.     Mouth/Throat:     Lips: Pink.     Mouth: Mucous membranes are moist.     Pharynx: Uvula midline. Pharyngeal swelling and posterior oropharyngeal erythema present.     Tonsils: 1+ on the right. 1+ on the left.  Eyes:     Extraocular Movements: Extraocular movements intact.     Pupils: Pupils are equal, round, and reactive to light.  Pulmonary:     Effort: Pulmonary effort is normal.  Skin:    General: Skin is warm and dry.  Neurological:     General: No focal deficit present.     Mental Status: She is alert and oriented to person, place, and time.  Psychiatric:        Mood and Affect: Mood normal.        Behavior: Behavior normal.      UC Treatments / Results  Labs (all labs ordered are listed, but only abnormal results are displayed) Labs Reviewed  CULTURE, GROUP A STREP [facility])  POCT RAPID STREP A (OFFICE)    EKG   Radiology No results found.  Procedures Procedures (including critical care time)  Medications Ordered in UC Medications - No data to display  Initial Impression / Assessment and Plan / UC Course  I have reviewed the triage vital signs and the nursing notes.  Pertinent labs & imaging results that were available during my care of the patient were reviewed by me and considered in my medical decision making (see chart for details).  The patient is well-appearing, she is in no acute distress, vital signs are stable.  Rapid strep test was negative.  Throat culture is pending.  Will treat patient's sore throat with Magic mouthwash with nystatin, hydrocortisone, and diphenhydramine for patient to swish and swallow while symptoms persist.  Supportive care recommendations were provided and discussed with the patient to include warm salt water gargles, over-the-counter analgesics, and a soft diet.  Patient was advised if symptoms are not improved over the next 5  to 7 days, or if they worsen, to follow-up in this clinic or with her PCP for further evaluation.  Patient is in agreement with this plan of care and verbalizes understanding.  All questions were answered.  Patient stable for discharge.   Final Clinical Impressions(s) / UC Diagnoses   Final diagnoses:  Throat pain     Discharge Instructions      The rapid strep test was negative.  A throat culture is pending.  You will be contacted if the pending test results are positive.  You will also have access to your results via MyChart. Take medication as prescribed. May take over-the-counter Tylenol as needed for pain, fever, or general discomfort. Warm salt water gargles 3-4 times daily as needed for throat pain or discomfort. May use over-the-counter Chloraseptic throat spray as needed for throat pain or discomfort. Increase fluids and allow for plenty of rest. If symptoms do not improve over the next 5 to 7 days, or worsen before that time, please follow-up in this clinic or with your primary care physician for further evaluation. Follow-up as needed.     ED Prescriptions     Medication Sig Dispense Auth. Provider   magic mouthwash (nystatin, hydrocortisone, diphenhydrAMINE) suspension Swish and swallow 5 mLs 3 (three) times daily as needed for up to 7 days for mouth pain. 220 mL Demarlo Riojas-Warren, Sadie Haber, NP      PDMP not reviewed this encounter.   Abran Cantor, NP 12/08/22 1825

## 2022-12-08 NOTE — ED Triage Notes (Signed)
Pain in front of left ear when swallowing since yesterday.

## 2022-12-11 LAB — CULTURE, GROUP A STREP (THRC)

## 2022-12-25 ENCOUNTER — Other Ambulatory Visit: Payer: Self-pay | Admitting: Family Medicine

## 2023-01-01 LAB — LAB REPORT - SCANNED: EGFR: 40

## 2023-01-06 ENCOUNTER — Other Ambulatory Visit: Payer: Self-pay | Admitting: Family Medicine

## 2023-01-06 DIAGNOSIS — I10 Essential (primary) hypertension: Secondary | ICD-10-CM

## 2023-01-12 ENCOUNTER — Other Ambulatory Visit (HOSPITAL_COMMUNITY): Payer: Medicare HMO

## 2023-01-13 ENCOUNTER — Inpatient Hospital Stay: Payer: Medicare HMO | Attending: Hematology

## 2023-01-13 DIAGNOSIS — I509 Heart failure, unspecified: Secondary | ICD-10-CM | POA: Insufficient documentation

## 2023-01-13 DIAGNOSIS — R5383 Other fatigue: Secondary | ICD-10-CM | POA: Diagnosis not present

## 2023-01-13 DIAGNOSIS — D500 Iron deficiency anemia secondary to blood loss (chronic): Secondary | ICD-10-CM | POA: Diagnosis present

## 2023-01-13 DIAGNOSIS — E785 Hyperlipidemia, unspecified: Secondary | ICD-10-CM | POA: Diagnosis not present

## 2023-01-13 DIAGNOSIS — Z8679 Personal history of other diseases of the circulatory system: Secondary | ICD-10-CM | POA: Diagnosis not present

## 2023-01-13 DIAGNOSIS — R0602 Shortness of breath: Secondary | ICD-10-CM | POA: Insufficient documentation

## 2023-01-13 DIAGNOSIS — Z8673 Personal history of transient ischemic attack (TIA), and cerebral infarction without residual deficits: Secondary | ICD-10-CM | POA: Insufficient documentation

## 2023-01-13 DIAGNOSIS — Z79899 Other long term (current) drug therapy: Secondary | ICD-10-CM | POA: Diagnosis not present

## 2023-01-13 DIAGNOSIS — K5900 Constipation, unspecified: Secondary | ICD-10-CM | POA: Insufficient documentation

## 2023-01-13 DIAGNOSIS — N1831 Chronic kidney disease, stage 3a: Secondary | ICD-10-CM | POA: Insufficient documentation

## 2023-01-13 DIAGNOSIS — K31811 Angiodysplasia of stomach and duodenum with bleeding: Secondary | ICD-10-CM | POA: Insufficient documentation

## 2023-01-13 DIAGNOSIS — R0609 Other forms of dyspnea: Secondary | ICD-10-CM | POA: Insufficient documentation

## 2023-01-13 DIAGNOSIS — I130 Hypertensive heart and chronic kidney disease with heart failure and stage 1 through stage 4 chronic kidney disease, or unspecified chronic kidney disease: Secondary | ICD-10-CM | POA: Diagnosis not present

## 2023-01-13 LAB — CBC WITH DIFFERENTIAL/PLATELET
Abs Immature Granulocytes: 0.02 10*3/uL (ref 0.00–0.07)
Basophils Absolute: 0.1 10*3/uL (ref 0.0–0.1)
Basophils Relative: 1 %
Eosinophils Absolute: 0.2 10*3/uL (ref 0.0–0.5)
Eosinophils Relative: 2 %
HCT: 43.7 % (ref 36.0–46.0)
Hemoglobin: 14.1 g/dL (ref 12.0–15.0)
Immature Granulocytes: 0 %
Lymphocytes Relative: 13 %
Lymphs Abs: 1 10*3/uL (ref 0.7–4.0)
MCH: 30.9 pg (ref 26.0–34.0)
MCHC: 32.3 g/dL (ref 30.0–36.0)
MCV: 95.6 fL (ref 80.0–100.0)
Monocytes Absolute: 0.7 10*3/uL (ref 0.1–1.0)
Monocytes Relative: 9 %
Neutro Abs: 5.6 10*3/uL (ref 1.7–7.7)
Neutrophils Relative %: 75 %
Platelets: 303 10*3/uL (ref 150–400)
RBC: 4.57 MIL/uL (ref 3.87–5.11)
RDW: 14.2 % (ref 11.5–15.5)
WBC: 7.5 10*3/uL (ref 4.0–10.5)
nRBC: 0 % (ref 0.0–0.2)

## 2023-01-13 LAB — FERRITIN: Ferritin: 85 ng/mL (ref 11–307)

## 2023-01-13 LAB — IRON AND TIBC
Iron: 107 ug/dL (ref 28–170)
Saturation Ratios: 25 % (ref 10.4–31.8)
TIBC: 427 ug/dL (ref 250–450)
UIBC: 320 ug/dL

## 2023-01-14 ENCOUNTER — Ambulatory Visit (HOSPITAL_COMMUNITY)
Admission: RE | Admit: 2023-01-14 | Discharge: 2023-01-14 | Disposition: A | Discharge status: Home / Self Care | Payer: Medicare HMO | Source: Ambulatory Visit | Attending: Physician Assistant | Admitting: Physician Assistant

## 2023-01-14 DIAGNOSIS — I5022 Chronic systolic (congestive) heart failure: Secondary | ICD-10-CM | POA: Diagnosis present

## 2023-01-14 LAB — ECHOCARDIOGRAM COMPLETE
AR max vel: 1.51 cm2
AV Area VTI: 1.55 cm2
AV Area mean vel: 1.76 cm2
AV Mean grad: 5 mm[Hg]
AV Peak grad: 10.1 mm[Hg]
Ao pk vel: 1.59 m/s
Area-P 1/2: 2.28 cm2
Calc EF: 44.5 %
S' Lateral: 4.2 cm
Single Plane A2C EF: 45.2 %
Single Plane A4C EF: 47.7 %

## 2023-01-14 NOTE — Progress Notes (Signed)
*  PRELIMINARY RESULTS* Echocardiogram 2D Echocardiogram has been performed.  Deanna Thompson 01/14/2023, 9:32 AM

## 2023-01-18 ENCOUNTER — Telehealth: Payer: Self-pay | Admitting: Cardiology

## 2023-01-18 NOTE — Telephone Encounter (Signed)
Dunn, Tacey Ruiz, PA-C  [facility] Triage Please call patient and let her know I reviewed her echo result with Dr. Sw[place] given the further decline in pump function (I had sent a preliminary mychart msg to her about this). Dr. Swaziland agrees that she would benefit from a heart catheterization (right and left). Since it has been greater than 1 month since last visit, recommend office visit to discuss. I would also recommend that she touch base with Dr. Fatima Sanger office regarding her previous brain aneurysm - had previous clipping then in 2019 her CT scan showed 3mm untreated L MCA brain aneurysm. I had recommended at recent visit with her in September to follow up with them to discuss updated evaluation, recommend she schedule appt with them ASAP to review whether this would need updated testing as this may impact the treatments explored with heart cath.   I spoke with patient and she is agreeable to 02/02/23 apt in the [place] office. She states she will call Dr.Deveshwars office today.

## 2023-01-18 NOTE — Telephone Encounter (Signed)
Patient wants a call back to discuss test results.

## 2023-01-24 NOTE — Progress Notes (Unsigned)
VIRTUAL VISIT via TELEPHONE NOTE [facility]   I connected with [place]  on 01/25/23 at  8:34 AM by telephone and verified that I am speaking with the correct person using two identifiers.  Location: Patient: Home Provider: [facility]   I discussed the limitations, risks, security and privacy concerns of performing an evaluation and management service by telephone and the availability of in person appointments. I also discussed with the patient that there may be a patient responsible charge related to this service. The patient expressed understanding and agreed to proceed.  REASON FOR VISIT: Iron deficiency anemia (bleeding AVMs)   CURRENT THERAPY: Intermittent IV iron  INTERVAL HISTORY:  [name] is contacted today for follow-up of her iron deficiency anemia.Marland Kitchen  She was last seen by Rojelio Brenner PA-C on 10/14/2022.   At today's visit, she reports feeling fairly well.  Since her last visit, she has been diagnosed with systolic CHF.  She reports mild fatigue with energy that waxes and wanes.  She also reports mild dyspnea on exertion.  She denies any pica, restless legs, headaches, chest pain, lightheadedness, or syncope. She has a history of bleeding AVMs, but has not had any recent hematemesis, hematochezia, or melena. She has 65-75% energy and 100% appetite. She endorses that she is maintaining a stable weight.   REVIEW OF SYSTEMS:   Review of Systems  Constitutional:  Positive for malaise/fatigue. Negative for chills, diaphoresis, fever and weight loss.  Respiratory:  Positive for shortness of breath (with exertion). Negative for cough.   Cardiovascular:  Negative for chest pain and palpitations.  Gastrointestinal:  Positive for constipation (IBS-C). Negative for abdominal pain, blood in stool, melena, nausea and vomiting.  Neurological:  Positive for dizziness (vertigo). Negative for headaches.     PHYSICAL EXAM: (per limitations  of virtual telephone visit)  The patient is alert and oriented x 3, exhibiting adequate mentation, good mood, and ability to speak in full sentences and execute sound judgement.  ASSESSMENT & PLAN:  1.  Iron deficiency anemia due to chronic blood loss - Due to chronic GI blood loss. - Patient was hospitalized in December 2020 for an AVM bleed where her hemoglobin dropped to 7.7. 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  Injected.  Treated with bipolar cautery.  Clips were placed. - EGD (06/04/2020): Esophageal nodule, gastritis.  No varices. - She has been transfused RBCs in the past - Most recent IV iron with Venofer 300 mg x 3 in July/August 2024 - No bright red blood per rectum or melena - Most recent labs (01/13/2023): Hgb 14.1/MCV 95.6, ferritin 85, iron saturation 25% with marginal TIBC 427 - PLAN: We will hold off on any IV iron at this time, since she is asymptomatic and ferritin levels are adequate.  Will recheck labs in 3 months, but she will contact us sooner if she has worsening fatigue and feels that she would benefit from IV iron.   2.  Other history - Her other pertinent medical history includes CKD stage IIIa,  liver cirrhosis, history of cerebral aneurysm with stroke, hyperlipidemia, and hypertension      PLAN SUMMARY: >> Labs in 3 months = CBC/D, ferritin, iron/TIBC >> PHONE visit after labs     I discussed the assessment and treatment plan with the patient. The patient was provided an opportunity to ask questions and all were answered. The patient  agreed with the plan and demonstrated an understanding of the instructions.   The patient was advised to call back or seek an in-person evaluation if the symptoms worsen or if the condition fails to improve as anticipated.  I provided 22 minutes of non-face-to-face time during this encounter.  Carnella Guadalajara, PA-C 01/25/23 9:21 AM

## 2023-01-25 ENCOUNTER — Inpatient Hospital Stay: Payer: Medicare HMO | Admitting: Physician Assistant

## 2023-01-25 ENCOUNTER — Encounter: Payer: Self-pay | Admitting: Physician Assistant

## 2023-01-25 DIAGNOSIS — D500 Iron deficiency anemia secondary to blood loss (chronic): Secondary | ICD-10-CM | POA: Diagnosis not present

## 2023-02-02 ENCOUNTER — Ambulatory Visit: Payer: Medicare HMO | Admitting: Physician Assistant

## 2023-02-05 ENCOUNTER — Encounter: Payer: Self-pay | Admitting: Internal Medicine

## 2023-02-09 ENCOUNTER — Encounter: Payer: Self-pay | Admitting: Nurse Practitioner

## 2023-02-09 ENCOUNTER — Ambulatory Visit: Payer: Medicare HMO | Attending: Nurse Practitioner | Admitting: Nurse Practitioner

## 2023-02-09 VITALS — BP 128/80 | HR 79 | Ht 68.0 in | Wt 237.2 lb

## 2023-02-09 DIAGNOSIS — I10 Essential (primary) hypertension: Secondary | ICD-10-CM

## 2023-02-09 DIAGNOSIS — I671 Cerebral aneurysm, nonruptured: Secondary | ICD-10-CM

## 2023-02-09 DIAGNOSIS — I5020 Unspecified systolic (congestive) heart failure: Secondary | ICD-10-CM

## 2023-02-09 DIAGNOSIS — I2510 Atherosclerotic heart disease of native coronary artery without angina pectoris: Secondary | ICD-10-CM | POA: Diagnosis not present

## 2023-02-09 DIAGNOSIS — R0609 Other forms of dyspnea: Secondary | ICD-10-CM | POA: Diagnosis not present

## 2023-02-09 DIAGNOSIS — I6523 Occlusion and stenosis of bilateral carotid arteries: Secondary | ICD-10-CM

## 2023-02-09 NOTE — Patient Instructions (Addendum)
Medication Instructions:  Your physician recommends that you continue on your current medications as directed. Please refer to the Current Medication list given to you today.  Labwork: None   Testing/Procedures: Your physician has requested that you have a carotid duplex. This test is an ultrasound of the carotid arteries in your neck. It looks at blood flow through these arteries that supply the brain with blood. Allow one hour for this exam. There are no restrictions or special instructions.  Follow-Up: Your physician recommends that you schedule a follow-up appointment in: 1 Month   Any Other Special Instructions Will Be Listed Below (If Applicable).  If you need a refill on your cardiac medications before your next appointment, please call your pharmacy.

## 2023-02-09 NOTE — Progress Notes (Unsigned)
Cardiology Office Note:  .   Date:  02/09/2023 ID:  [name], DOB 01-25-1956, MRN [id number] PCP: Tommie Sams DO  [facility] Providers Cardiologist:  Dina Rich, MD    History of Present Illness: .   Egan Zamor is a 67 y.o. 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  Last seen by Ronie Spies, PA-C on December 04, 2022 for follow-up.  Was found to be overall doing well from a cardiac perspective.  She noted chronic unchanged fatigue, felt to be multifactorial.  Denying chest pain or dyspnea.  Has started walking again at the time.  Noted chronic history of episodic dizziness related to vertigo.  Updated echo revealed EF worsening at 30 to 35%, was 45 to 50%.  Dr. Swaziland was consulted about whether to pursue cath for worsening LV function.  Dr. Swaziland agreed that she would benefit from heart catheterization, right and left.  Recommended for patient to follow-up in office to discuss.  Recommended that patient touch base with Dr. Corliss Skains office regarding her previous brain aneurysm.  Today she presents for office follow-up. She states she is doing well. Denies any chest pain. Says she forgot to follow-up with Dr. Fatima Sanger office regarding her cerebral aneurysm. Says she remembers the last measurement was 3 mm. She notes shortness of breath "only when thinking about it." States she does have shortness of breath with walking, but says this is not all the time. Denies any  palpitations, syncope, presyncope, dizziness, orthopnea, PND, swelling or significant weight changes, acute bleeding, or  claudication.  ROS: Negative. See HPI.  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   Studies Reviewed: Marland Kitchen    EKG: EKG Interpretation Date/Time:  Tuesday February 09 2023 16:12:31 EST Ventricular Rate:  71 PR Interval:  244 QRS Duration:  168 QT Interval:  452 QTC Calculation: 491 R Axis:   70  Text Interpretation: Sinus rhythm with 1st degree A-V block Left bundle branch block When compared with ECG of 04-Dec-2022 13:25, PR interval has increased QRS axis Shifted left T wave inversion less evident in Inferior leads Confirmed by Sharlene Dory [id number]) on 02/09/2023 4:21:40 PM   Echo 12/2022: 1. Left ventricular ejection fraction, by estimation, is 30 to 35%. The  left ventricle has moderately decreased function. The left ventricle  demonstrates regional wall motion abnormalities (see scoring  diagram/findings for description). Left ventricular   diastolic parameters are consistent with Grade I diastolic dysfunction  (impaired relaxation).   2. Right ventricular systolic function is normal. The right ventricular  size is normal. There is normal pulmonary artery systolic pressure.   3. The mitral valve is normal in structure. Trivial mitral valve  regurgitation. No evidence of mitral stenosis.   4. The tricuspid valve is abnormal.   5. The aortic valve has an indeterminant number of cusps. There is mild  calcification of the aortic valve. There is mild thickening of the aortic  valve. Aortic valve regurgitation is not visualized. No aortic stenosis is  present.   6. The inferior vena cava is normal in size with greater than 50%  respiratory variability, suggesting right atrial pressure of 3 mmHg.  Lexiscan 11/2017: The  left ventricular ejection fraction is mildly decreased (45-54%). Very severe gut radiotracer uptake in the resting and post-injection images, most significant in the post-injection images. Overall perfusion imaging is nondiagnostic. Low  LVEF is suggested however looks normal by visual evaluation. Consider correlating with echo  Physical Exam:   VS:  BP 128/80   Pulse 79   Ht 5\' 8"  (1.727 m)   Wt 237 lb 3.2 oz (107.6 kg)   SpO2 98%   BMI 36.07 kg/m    Wt Readings from Last 3 Encounters:  02/09/23 237 lb 3.2 oz (107.6 kg)  12/04/22 234 lb (106.1 kg)  10/14/22 236 lb 1.8 oz (107.1 kg)    GEN: Obese, 67 y.o. female in no acute distress NECK: No JVD; No carotid bruits CARDIAC: S1/S2, RRR, no murmurs, rubs, gallops RESPIRATORY:  Clear to auscultation without rales, wheezing or rhonchi  ABDOMEN: Soft, non-tender, non-distended EXTREMITIES:  No edema; No deformity   ASSESSMENT AND PLAN: .    HFmrEF -> HFrEF, Cardiomyopathy Stage C, NYHA class I-II symptoms. Echo 12/2022 revealed EF reduced to 30-35% from 45-50%, unclear etiology. Ronie Spies, PA-C previously discussed case with Dr. Swaziland regarding ischemic evaluation (right and left cardiac cath) to further evaluate reduction in EF. Dr. Swaziland agreed with right and left cardiac catheterization. Discussed Echo results and recommended cardiac catheterization with patient. Discussed risks vs benefits of right and left cardiac catheterization with patient and she verbalized understanding and is agreeable to proceed, however patient has not been evaluated by Dr. Fatima Sanger office as previously advised by Ronie Spies, PA-C that I am also in agreement with before proceeding with cardiac catheterization. Will reach out to his office as outlined below. Euvolemic and well compensated on exam. Continue Bumex and spironolactone. Was unable to tolerate Losartan as this caused hypotension in the past and says she has had a history of UTI's, discussed SGLT2i and came to shared medical decision to hold off starting SGLT2i d/t her history of UTI's in the past. No medication changes at this time. Low sodium diet, fluid restriction <2L, and daily weights encouraged. Educated to contact our office for  weight gain of 2 lbs overnight or 5 lbs in one week. Care and ED precautions discussed.  2. Coronary artery calcification, DOE Denies any chest pain, but admits to occasional dyspnea on exertion with walking, not all the time per her report. Recommended evaluation by Dr. Fatima Sanger office as mentioned above prior to cardiac catheterization as previously recommended by Ronie Spies, PA-C. Will reach out to Dr. Fatima Sanger office as mentioned below. Continue current medication regimen. Heart healthy diet encouraged. Care and ED precautions discussed.  4. Carotid artery atherosclerosis/stenosis She has not been evaluated by Dr. Fatima Sanger team as previously recommended by Ronie Spies, PA-C. Will reach out to his office as mentioned below. Dayna previously recommended that if she does not have updated cerebral vascular testing by their team between last visit and next upcoming visit to obtain f/u surveillance carotid doppler, mild atherosclerosis was seen on prior CT scan. Will arrange carotid doppler. Denies any concerning symptoms. Continue current medication regimen.   5. 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 Fatima Sanger team as previously recommended by Ronie Spies, PA-C. Will reach out to his office. Patient states her previous measurement was 3 mm. Denies any concerning signs or symptoms. She will require evaluation by Dr. Corliss Skains prior to setting up cardiac catheterization as mentioned above. Arranging carotid doppler as mentioned  above. Care and ED precautions discussed.   Dispo: Follow-up with me/APP in 1 month or sooner if anything changes.   Signed, Sharlene Dory, NP

## 2023-02-11 ENCOUNTER — Other Ambulatory Visit (HOSPITAL_COMMUNITY): Payer: Self-pay | Admitting: Interventional Radiology

## 2023-02-11 DIAGNOSIS — I671 Cerebral aneurysm, nonruptured: Secondary | ICD-10-CM

## 2023-02-16 ENCOUNTER — Telehealth: Payer: Self-pay | Admitting: Nurse Practitioner

## 2023-02-16 ENCOUNTER — Ambulatory Visit (HOSPITAL_COMMUNITY)
Admission: RE | Admit: 2023-02-16 | Discharge: 2023-02-16 | Disposition: A | Discharge status: Home / Self Care | Payer: Medicare HMO | Source: Ambulatory Visit | Attending: Interventional Radiology | Admitting: Interventional Radiology

## 2023-02-16 DIAGNOSIS — I671 Cerebral aneurysm, nonruptured: Secondary | ICD-10-CM

## 2023-02-16 NOTE — Telephone Encounter (Signed)
Patient states her appointment is with Dr.Devenswar today at 1PM

## 2023-02-16 NOTE — Telephone Encounter (Signed)
-----   Message from Sharlene Dory sent at 02/15/2023  4:55 PM EST ----- Please call and ask Ms. [facility] if she has heard back from Dr. Fatima Sanger office. Lynden Ang has called and left a voicemail with his office. She needs to be evaluated prior to setting up her cath and needs thorough evaluation from them.   Thanks!   Best, Sharlene Dory, NP

## 2023-02-17 HISTORY — PX: IR RADIOLOGIST EVAL & MGMT: IMG5224

## 2023-02-22 ENCOUNTER — Ambulatory Visit: Payer: Medicare HMO | Attending: Nurse Practitioner

## 2023-02-22 DIAGNOSIS — I2510 Atherosclerotic heart disease of native coronary artery without angina pectoris: Secondary | ICD-10-CM | POA: Diagnosis not present

## 2023-02-23 ENCOUNTER — Telehealth (HOSPITAL_COMMUNITY): Payer: Self-pay

## 2023-02-23 NOTE — Telephone Encounter (Signed)
Called to schedule cta, no answer, left vm. AB

## 2023-03-01 ENCOUNTER — Telehealth (HOSPITAL_COMMUNITY): Payer: Self-pay

## 2023-03-01 ENCOUNTER — Other Ambulatory Visit (HOSPITAL_COMMUNITY): Payer: Self-pay | Admitting: Interventional Radiology

## 2023-03-01 DIAGNOSIS — I671 Cerebral aneurysm, nonruptured: Secondary | ICD-10-CM

## 2023-03-01 NOTE — Telephone Encounter (Signed)
Called to schedule cta, no answer, left vm. AB 

## 2023-03-03 NOTE — Telephone Encounter (Signed)
Based on October labs kidney function would still be ok for contrast used for CT scans or heart caths  Dominga Ferry MD

## 2023-03-11 ENCOUNTER — Encounter: Payer: Self-pay | Admitting: Nurse Practitioner

## 2023-03-11 ENCOUNTER — Ambulatory Visit: Payer: Medicare HMO | Attending: Nurse Practitioner | Admitting: Nurse Practitioner

## 2023-03-11 VITALS — BP 142/77 | HR 78 | Ht 68.0 in | Wt 232.0 lb

## 2023-03-11 DIAGNOSIS — M25512 Pain in left shoulder: Secondary | ICD-10-CM

## 2023-03-11 DIAGNOSIS — I2510 Atherosclerotic heart disease of native coronary artery without angina pectoris: Secondary | ICD-10-CM | POA: Diagnosis not present

## 2023-03-11 DIAGNOSIS — I6523 Occlusion and stenosis of bilateral carotid arteries: Secondary | ICD-10-CM

## 2023-03-11 DIAGNOSIS — I671 Cerebral aneurysm, nonruptured: Secondary | ICD-10-CM

## 2023-03-11 DIAGNOSIS — I5020 Unspecified systolic (congestive) heart failure: Secondary | ICD-10-CM | POA: Diagnosis not present

## 2023-03-11 NOTE — Patient Instructions (Signed)
Medication Instructions:  Your physician recommends that you continue on your current medications as directed. Please refer to the Current Medication list given to you today.  Labwork: None   Testing/Procedures: None   Follow-Up: Your physician recommends that you schedule a follow-up appointment in: 4-6 weeks telephone   Any Other Special Instructions Will Be Listed Below (If Applicable).  If you need a refill on your cardiac medications before your next appointment, please call your pharmacy.

## 2023-03-11 NOTE — Progress Notes (Addendum)
Virtual Visit via Telephone Note   Because of [place] Sawtelle's co-morbid illnesses, she is at least at moderate risk for complications without adequate follow up.  This format is felt to be most appropriate for this patient at this time.  The patient did not have access to video technology/had technical difficulties with video requiring transitioning to audio format only (telephone).  All issues noted in this document were discussed and addressed.  No physical exam could be performed with this format.  Please refer to the patient's chart for her consent to telehealth for [facility].    Date:  03/11/2023  ID:  Deanna Thompson, DOB 04-23-1955, MRN [id number] The patient was identified using 2 identifiers. Patient Location: Home Provider Location: Office/Clinic PCP:  Tommie Sams, DO [facility] Providers Cardiologist:  Dina Rich, MD     Evaluation Performed:  Follow-Up Visit  Chief Complaint:  Here for follow-up.   History of Present Illness:    Deanna Thompson is a 67 y.o. 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  Last seen by Ronie Spies, PA-C on December 04, 2022 for follow-up.  Was found to be overall doing well from a cardiac perspective.  She noted chronic unchanged fatigue, felt to be multifactorial.  Denying chest pain or dyspnea.  Has started walking again at the time.  Noted chronic history of episodic dizziness related to vertigo.  Updated echo revealed EF worsening at 30 to 35%, was 45 to 50%.  Dr. Swaziland was consulted about whether to pursue cath for worsening LV function.  Dr.  Swaziland agreed that she would benefit from heart catheterization, right and left.  Recommended for patient to follow-up in office to discuss.  Recommended that patient touch base with Dr. Corliss Skains office regarding her previous brain aneurysm.  I saw her on February 09, 2023. Was doing well. Denied any chest pain. Stated she forgot to follow-up with Dr. Fatima Sanger office regarding her cerebral aneurysm. Stated the last measurement was 3 mm. She noted shortness of breath "only when thinking about it." Stated she does have shortness of breath with walking, was not all the time. Denied any  palpitations, syncope, presyncope, dizziness, orthopnea, PND, swelling or significant weight changes, acute bleeding, or claudication.  Seen by Dr. Corliss Skains on February 16, 2023. He arranged a CT angiogram of head in neck for further evaluation.   Today she presents for follow-up. Pt says overall she is doing well. Admits to fatigue and a "weirdness" along her left shoulder, says she has a sensation of "pins and needs" along tip of left shoulder. Denies any injury to left shoulder. Denies any chest pain, shortness of breath, palpitations, syncope, presyncope, dizziness, orthopnea, PND, swelling or significant weight changes, acute bleeding, or claudication.  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   ROS:   Please see the history of present illness.    All other systems reviewed and are negative.   Prior CV studies:   The following studies were reviewed today:  Echo 12/2022: 1. Left ventricular ejection fraction, by estimation, is 30 to 35%. The left ventricle has moderately decreased function. The left ventricle demonstrates regional wall motion abnormalities (see  scoring diagram/findings for description). Left ventricular   diastolic parameters are consistent with Grade I diastolic dysfunction (impaired relaxation).   2. Right ventricular systolic function is normal. The right  ventricular size is normal. There is normal pulmonary artery systolic pressure.   3. The mitral valve is normal in structure. Trivial mitral valve  regurgitation. No evidence of mitral stenosis.   4. The tricuspid valve is abnormal.   5. The aortic valve has an indeterminant number of cusps. There is mild calcification of the aortic valve. There is mild thickening of the aortic valve. Aortic valve regurgitation is not visualized. No aortic stenosis is present.   6. The inferior vena cava is normal in size with greater than 50% respiratory variability, suggesting right atrial pressure of 3 mmHg.  Lexiscan 11/2017: The left ventricular ejection fraction is mildly decreased (45-54%). Very severe gut radiotracer uptake in the resting and post-injection images, most significant in the post-injection images. Overall perfusion imaging is nondiagnostic. Low LVEF is suggested however looks normal by visual evaluation. Consider correlating with echo  Labs/Other Tests and Data Reviewed:    EKG:  EKG is not ordered today.      Objective:    Vital Signs:  BP (!) 142/77   Pulse 78   Ht 5\' 8"  (1.727 m)   Wt 232 lb (105.2 kg)   BMI 35.28 kg/m    Due to nature of today's visit, a physical exam was unable to be performed.  ASSESSMENT & PLAN:    HFmrEF -> HFrEF, Cardiomyopathy Stage C, NYHA class I-II symptoms. Echo 12/2022 revealed EF reduced to 30-35% from 45-50%, unclear etiology. Ronie Spies, PA-C previously discussed case with Dr. Swaz[place] regarding ischemic evaluation (right and left cardiac cath) to further evaluate reduction in EF. Dr. [place] agreed with right and left cardiac catheterization. Discussed risks vs benefits of right and left cardiac catheterization with patient and she verbalized understanding and is agreeable to proceed, however patient has not completed CT angiogram of head and neck arranged by Dr. Corliss Skains. Will reach out to his office. Euvolemic and well compensated on exam.  Continue Bumex and spironolactone. Was unable to tolerate Losartan as this caused hypotension in the past and says she has had a history of UTI's, discussed SGLT2i and came to shared medical decision to hold off starting SGLT2i d/t her history of UTI's in the past. No medication changes at this time. Low sodium diet, fluid restriction <2L, and daily weights encouraged. Educated to contact our office for weight gain of 2 lbs overnight or 5 lbs in one week. Care and ED precautions discussed.  2. Coronary artery calcification Denies any chest pain. Recommended to complete evaluation by Dr. Fatima Sanger office as mentioned above prior to cardiac catheterization as previously recommended by Ronie Spies, PA-C. Will reach out to Dr. Fatima Sanger office as mentioned below. Continue current medication regimen. Heart healthy diet encouraged. Care and ED precautions discussed.  4. Carotid artery atherosclerosis/stenosis She has not completed evaluation by Dr. Fatima Sanger team. Will reach out to his office as mentioned below.  Recent carotid Doppler revealed mild bilateral ICA stenosis at 1 to 39%. Denies any concerning symptoms. Continue current medication regimen.   5. 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 Fatima Sanger team, CT angiogram has been arranged but not completed. Will reach out to his office. Patient states her previous measurement was 3 mm. Denies any concerning signs or symptoms. She will require evaluation by Dr.  Deveshwar prior to setting up cardiac catheterization as mentioned above. Care and ED precautions discussed.    6. Left shoulder pain CT angiogram of head and neck arranged by Dr. Corliss Skains, will get this completed and reach out to office. Continue to follow with PCP for further evaluation. Care and ED precautions discussed.    Time:   Today, I have spent 20 minutes with the patient with telehealth technology  discussing the above problems.     Medication Adjustments/Labs and Tests Ordered: Current medicines are reviewed at length with the patient today.  Concerns regarding medicines are outlined above.   Tests Ordered: No orders of the defined types were placed in this encounter.   Medication Changes: No orders of the defined types were placed in this encounter.   Follow Up:  by telephone in 4-6 weeks.   Signed, Sharlene Dory, NP

## 2023-03-12 ENCOUNTER — Encounter: Payer: Self-pay | Admitting: Nurse Practitioner

## 2023-03-12 ENCOUNTER — Encounter (HOSPITAL_COMMUNITY): Payer: Self-pay

## 2023-03-12 ENCOUNTER — Telehealth: Payer: Self-pay | Admitting: Nurse Practitioner

## 2023-03-12 NOTE — Telephone Encounter (Signed)
-----   Message from Sharlene Dory sent at 03/12/2023  1:53 PM EST ----- Yes, she needs to call but I know there is difficulty with contacting his office.  If we can expedite this for her and contact office to get this done ASAP that would be great.  Thanks!   Best,  Sharlene Dory, NP ----- Message ----- From: Sharen Hones Sent: 03/12/2023   1:44 PM EST To: Megan Salon; Sharlene Dory, NP  Order has already been placed by Julieanne Cotton, MD this just needs to be schedule should his office do this? ----- Message ----- From: Sharlene Dory, NP Sent: 03/12/2023   1:23 PM EST To: Megan Salon; Sharen Hones  Hello Tori and Donalda Ewings this patient yesterday. She is supposed to have a CT angiogram of head and neck done prior to next office visit so she can proceed with an eventual heart cath. Can we please expedite this to be done ASAP? I would like to have this done prior to when I next see her for f/u.   Thanks!   Best,  Sharlene Dory, NP

## 2023-03-12 NOTE — Telephone Encounter (Signed)
Spoke with patient on this matter and Deveshwar, Simonne Maffucci, MD office and we got this patient scheduled for her CT Angio of head and neck before next OV with [place]

## 2023-03-19 ENCOUNTER — Ambulatory Visit (HOSPITAL_COMMUNITY)
Admission: RE | Admit: 2023-03-19 | Discharge: 2023-03-19 | Disposition: A | Discharge status: Home / Self Care | Payer: Medicare HMO | Source: Ambulatory Visit | Attending: Interventional Radiology | Admitting: Interventional Radiology

## 2023-03-19 DIAGNOSIS — I671 Cerebral aneurysm, nonruptured: Secondary | ICD-10-CM | POA: Insufficient documentation

## 2023-03-19 MED ORDER — IOHEXOL 350 MG/ML SOLN
75.0000 mL | Freq: Once | INTRAVENOUS | Status: AC | PRN
  Administered 2023-03-19: 75 mL via INTRAVENOUS

## 2023-04-07 ENCOUNTER — Telehealth (HOSPITAL_COMMUNITY): Payer: Self-pay

## 2023-04-07 NOTE — Telephone Encounter (Signed)
Pt agreed to f/u in 6 months with a cta head and neck. AB

## 2023-04-07 NOTE — Telephone Encounter (Signed)
Called pt regarding recent imaging, no answer, left vm. AB  

## 2023-04-12 ENCOUNTER — Telehealth: Payer: Self-pay | Admitting: *Deleted

## 2023-04-12 ENCOUNTER — Ambulatory Visit: Payer: Medicare HMO | Attending: Nurse Practitioner | Admitting: Nurse Practitioner

## 2023-04-12 ENCOUNTER — Encounter: Payer: Self-pay | Admitting: Nurse Practitioner

## 2023-04-12 VITALS — BP 127/64 | HR 74 | Ht 68.0 in | Wt 229.0 lb

## 2023-04-12 DIAGNOSIS — I429 Cardiomyopathy, unspecified: Secondary | ICD-10-CM

## 2023-04-12 DIAGNOSIS — I2510 Atherosclerotic heart disease of native coronary artery without angina pectoris: Secondary | ICD-10-CM

## 2023-04-12 DIAGNOSIS — R42 Dizziness and giddiness: Secondary | ICD-10-CM

## 2023-04-12 DIAGNOSIS — Z01812 Encounter for preprocedural laboratory examination: Secondary | ICD-10-CM

## 2023-04-12 DIAGNOSIS — I6523 Occlusion and stenosis of bilateral carotid arteries: Secondary | ICD-10-CM

## 2023-04-12 DIAGNOSIS — R2689 Other abnormalities of gait and mobility: Secondary | ICD-10-CM

## 2023-04-12 DIAGNOSIS — I6502 Occlusion and stenosis of left vertebral artery: Secondary | ICD-10-CM

## 2023-04-12 DIAGNOSIS — I5020 Unspecified systolic (congestive) heart failure: Secondary | ICD-10-CM

## 2023-04-12 DIAGNOSIS — I671 Cerebral aneurysm, nonruptured: Secondary | ICD-10-CM

## 2023-04-12 NOTE — Patient Instructions (Addendum)
 Medication Instructions:  Your physician recommends that you continue on your current medications as directed. Please refer to the Current Medication list given to you today.  Labwork: In 2-3 weeks at [facility]   Testing/Procedures: None   Follow-Up: Your physician recommends that you schedule a follow-up appointment in: 3-4 weeks   Any Other Special Instructions Will Be Listed Below (If Applicable).  If you need a refill on your cardiac medications before your next appointment, please call your pharmacy.

## 2023-04-12 NOTE — Telephone Encounter (Signed)
 The patient verbally consented for a telehealth phone visit with [facility] and understands that his/her insurance company will be billed for the encounter.

## 2023-04-12 NOTE — Progress Notes (Signed)
 Virtual Visit via Telephone Note   Because of Deanna  Thompson's co-morbid illnesses, she is at least at moderate risk for complications without adequate follow up.  This format is felt to be most appropriate for this patient at this time.  The patient did not have access to video technology/had technical difficulties with video requiring transitioning to audio format only (telephone).  All issues noted in this document were discussed and addressed.  No physical exam could be performed with this format.  Please refer to the patient's chart for her consent to telehealth for [facility].    Date:  04/12/2023  ID:  Deanna  Aaliah, Thompson 08/06/1955, MRN [id number] The patient was identified using 2 identifiers. Patient Location: Home Provider Location: Office/Clinic PCP:  Cook, Jayce G, DO [facility] Providers Cardiologist:  Alvan Carrier, MD     Evaluation Performed:  Follow-Up Visit  Chief Complaint:  Here for follow-up.   History of Present Illness:    Deanna  Thompson is a 68 y.o. 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  Last seen by Dayna Dunn, PA-C on December 04, 2022 for follow-up.  Was found to be overall doing well from a cardiac perspective.  She noted chronic unchanged fatigue, felt to be multifactorial.  Denying chest pain or dyspnea.  Has started walking again at the time.  Noted chronic history of episodic dizziness related to vertigo.  Updated echo revealed EF worsening at 30 to 35%, was 45 to 50%.  Dr. Jordan was consulted about whether to pursue cath for worsening LV  function.  Dr. Jordan agreed that she would benefit from heart catheterization, right and left.  Recommended for patient to follow-up in office to discuss.  Recommended that patient touch base with Dr. Dolphus office regarding her previous brain aneurysm.  I saw her on February 09, 2023. Was doing well. Denied any chest pain. Stated she forgot to follow-up with Dr. Jammie office regarding her cerebral aneurysm. Stated the last measurement was 3 mm. She noted shortness of breath only when thinking about it. Stated she does have shortness of breath with walking, was not all the time. Denied any  palpitations, syncope, presyncope, dizziness, orthopnea, PND, swelling or significant weight changes, acute bleeding, or claudication.  Seen by Dr. Dolphus on February 16, 2023. He arranged a CT angiogram of head in neck for further evaluation. See results below.   Today she presents for telehealth follow-up. Pt says overall she is doing well. Denies any chest pain, shortness of breath, palpitations, syncope, presyncope, dizziness, orthopnea, PND, swelling or significant weight changes, acute bleeding, or claudication.  SH: Experience in social services, received Master's degree at [employer].   ROS:   Please see the history of present illness.    All other systems reviewed and are negative.   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   Treated basilar tip aneurysm. No visible recurrent/residual aneurysm, although streak artifact limits assessment.   No large vessel occlusion.   Moderate distal left V2 vertebral artery stenosis.   Aortic Atherosclerosis (ICD10-I70.0).  Carotid duplex  01/2023: Summary:  Right Carotid: Velocities in the right ICA are consistent with a 1-39% stenosis. Non-hemodynamically significant plaque <50% noted in the CCA. The ECA appears <50%  stenosed.   Left Carotid: Velocities in the left ICA are consistent with a 1-39% stenosis. Non-hemodynamically significant plaque <50% noted in the CCA. The ECA appears <50% stenosed.   Vertebrals:  Bilateral vertebral arteries demonstrate antegrade flow. Subclavians: Normal flow hemodynamics were seen in bilateral subclavian arteries.   *See table(s) above for measurements and observations.  Echo 12/2022: 1. Left ventricular ejection fraction, by estimation, is 30 to 35%. The left ventricle has moderately decreased function. The left ventricle demonstrates regional wall motion abnormalities (see scoring diagram/findings for description). Left ventricular   diastolic parameters are consistent with Grade I diastolic dysfunction (impaired relaxation).   2. Right ventricular systolic function is normal. The right ventricular size is normal. There is normal pulmonary artery systolic pressure.   3. The mitral valve is normal in structure. Trivial mitral valve  regurgitation. No evidence of mitral stenosis.   4. The tricuspid valve is abnormal.   5. The aortic valve has an indeterminant number of cusps. There is mild calcification of the aortic valve. There is mild thickening of the aortic valve. Aortic valve regurgitation is not visualized. No aortic stenosis is present.   6. The inferior vena cava is normal in size with greater than 50% respiratory variability, suggesting right atrial pressure of 3 mmHg.  Lexiscan  11/2017: The left ventricular ejection fraction is mildly decreased (45-54%). Very severe gut radiotracer uptake in the resting and post-injection images, most significant in the post-injection images. Overall perfusion imaging is nondiagnostic. Low LVEF is suggested however looks normal by visual evaluation. Consider correlating with echo  Labs/Other Tests and Data Reviewed:    EKG:  EKG is not ordered today.      Objective:    Vital Signs:  BP 127/64   Pulse 74   Ht 5' 8 (1.727  m)   Wt 229 lb (103.9 kg)   SpO2 100%   BMI 34.82 kg/m    Due to nature of today's visit, a physical exam was unable to be performed.  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 Echo 12/2022 revealed EF reduced to 30-35% from 45-50%, unclear etiology. Raphael Bring, PA-C previously discussed case with Dr. Jordan regarding ischemic evaluation (right and left cardiac cath) to further evaluate reduction in EF. Dr. Jordan agreed with right and left cardiac catheterization. Discussed risks vs benefits of right and left cardiac catheterization with patient and she verbalized understanding and is agreeable to proceed. Case also discussed via secure chat with attending cardiologist, who agrees to proceed. Euvolemic and well compensated on exam. Continue Bumex  and spironolactone . Was unable to tolerate Losartan  as this caused hypotension in the past and says she has had a history of UTI's, previously discussed SGLT2i and came to shared medical decision to hold off starting SGLT2i d/t her history of UTI's in the past. No medication changes at this time. Low sodium diet, fluid restriction <2L, and daily weights encouraged. Educated to contact our office for weight gain of 2 lbs overnight or 5 lbs in one week. Care and ED precautions discussed. Will obtain CBC and BMET for her pre-procedure labs. Will also arrange visit to have EKG performed pre-procedure. Orders are under signed and held.  Informed Consent   Shared Decision Making/Informed Consent The risks [stroke (1 in 1000),  death (1 in 1000), kidney failure [usually temporary] (1 in 500), bleeding (1 in 200), allergic reaction [possibly serious] (1 in 200)], benefits (diagnostic support and management of coronary artery disease) and alternatives of a cardiac catheterization were discussed in detail with Ms. [facility] and she is willing to proceed.    2. Coronary artery calcification Denies  any chest pain. Proceeding with right and left heart cath as mentioned above. Continue current medication regimen. Heart healthy diet encouraged. Care and ED precautions discussed.  4. Carotid artery atherosclerosis/stenosis, vertebral artery stenosis Recent carotid Doppler revealed mild bilateral ICA stenosis at 1 to 39%. Recent CT angio of head and neck revealed moderate distal left V2 vertebral artery stenosis. Denies any concerning symptoms. Continue current medication regimen.   5. 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 Denies any concerning signs or symptoms. Care and ED precautions discussed.    6. Vertigo, poor balance Longstanding history per patient's report, denies any recent concerning symptoms. Continue to follow with PCP, discussed she may benefit from PT. Care and ED precautions discussed.   Time:   Today, I have spent 13 minutes with the patient with telehealth technology discussing the above problems.    Medication Adjustments/Labs and Tests Ordered: Current medicines are reviewed at length with the patient today.  Concerns regarding medicines are outlined above.   Tests Ordered: Orders Placed This Encounter  Procedures   CBC   Basic Metabolic Panel (BMET)    Medication Changes: No orders of the defined types were placed in this encounter.   Follow Up: in person in 3-4 weeks post cardiac catheterization with Dr. Alvan or APP.   Signed, Almarie Crate, NP

## 2023-04-13 ENCOUNTER — Encounter: Payer: Self-pay | Admitting: Nurse Practitioner

## 2023-04-13 ENCOUNTER — Telehealth: Payer: Self-pay | Admitting: Nurse Practitioner

## 2023-04-13 LAB — LAB REPORT - SCANNED: EGFR: 38

## 2023-04-13 NOTE — Telephone Encounter (Signed)
 Checking percert on the following   L & R cath 04/22/23 at 9 AM arrival at 7 am with Dr.Ganji .

## 2023-04-15 ENCOUNTER — Other Ambulatory Visit (HOSPITAL_COMMUNITY)
Admission: RE | Admit: 2023-04-15 | Discharge: 2023-04-15 | Disposition: A | Discharge status: Home / Self Care | Payer: Medicare HMO | Source: Ambulatory Visit | Attending: Nurse Practitioner | Admitting: Nurse Practitioner

## 2023-04-15 ENCOUNTER — Ambulatory Visit: Payer: Medicare HMO | Attending: Internal Medicine | Admitting: *Deleted

## 2023-04-15 DIAGNOSIS — Z01818 Encounter for other preprocedural examination: Secondary | ICD-10-CM | POA: Diagnosis present

## 2023-04-15 DIAGNOSIS — Z01812 Encounter for preprocedural laboratory examination: Secondary | ICD-10-CM | POA: Diagnosis present

## 2023-04-15 LAB — CBC
HCT: 39.3 % (ref 36.0–46.0)
Hemoglobin: 13.3 g/dL (ref 12.0–15.0)
MCH: 32 pg (ref 26.0–34.0)
MCHC: 33.8 g/dL (ref 30.0–36.0)
MCV: 94.7 fL (ref 80.0–100.0)
Platelets: 274 10*3/uL (ref 150–400)
RBC: 4.15 MIL/uL (ref 3.87–5.11)
RDW: 14.3 % (ref 11.5–15.5)
WBC: 5.7 10*3/uL (ref 4.0–10.5)
nRBC: 0 % (ref 0.0–0.2)

## 2023-04-15 LAB — BASIC METABOLIC PANEL
Anion gap: 8 (ref 5–15)
BUN: 23 mg/dL (ref 8–23)
CO2: 25 mmol/L (ref 22–32)
Calcium: 9.3 mg/dL (ref 8.9–10.3)
Chloride: 104 mmol/L (ref 98–111)
Creatinine, Ser: 1.28 mg/dL — ABNORMAL HIGH (ref 0.44–1.00)
GFR, Estimated: 46 mL/min — ABNORMAL LOW (ref >60)
Glucose, Bld: 110 mg/dL — ABNORMAL HIGH (ref 70–99)
Potassium: 3.8 mmol/L (ref 3.5–5.1)
Sodium: 137 mmol/L (ref 135–145)

## 2023-04-15 NOTE — Progress Notes (Signed)
Pt in office for EKG and labs for upcoming heart cath.

## 2023-04-16 ENCOUNTER — Encounter: Payer: Self-pay | Admitting: Family Medicine

## 2023-04-16 ENCOUNTER — Ambulatory Visit: Payer: Medicare HMO

## 2023-04-16 ENCOUNTER — Ambulatory Visit (INDEPENDENT_AMBULATORY_CARE_PROVIDER_SITE_OTHER): Payer: Medicare HMO | Admitting: Family Medicine

## 2023-04-16 VITALS — BP 110/78 | HR 86 | Temp 97.2°F | Ht 68.0 in | Wt 232.0 lb

## 2023-04-16 DIAGNOSIS — N1831 Chronic kidney disease, stage 3a: Secondary | ICD-10-CM | POA: Diagnosis not present

## 2023-04-16 DIAGNOSIS — I5042 Chronic combined systolic (congestive) and diastolic (congestive) heart failure: Secondary | ICD-10-CM | POA: Diagnosis not present

## 2023-04-16 DIAGNOSIS — R7989 Other specified abnormal findings of blood chemistry: Secondary | ICD-10-CM

## 2023-04-16 LAB — POCT URINALYSIS DIP (CLINITEK)
Bilirubin, UA: NEGATIVE
Blood, UA: NEGATIVE
Glucose, UA: NEGATIVE mg/dL
Ketones, POC UA: NEGATIVE mg/dL
Nitrite, UA: NEGATIVE
POC PROTEIN,UA: NEGATIVE
Spec Grav, UA: 1.01 (ref 1.010–1.025)
Urobilinogen, UA: 0.2 U/dL
pH, UA: 6 (ref 5.0–8.0)

## 2023-04-16 MED ORDER — BUMETANIDE 1 MG PO TABS: ORAL_TABLET | ORAL | 1 refills | Status: DC

## 2023-04-16 NOTE — Patient Instructions (Signed)
Creatinine stable.  Hope things get better!  Take care  Dr. Adriana Simas

## 2023-04-17 NOTE — Progress Notes (Signed)
EKG reviewed and stable without acute ischemic changes. She may proceed to upcoming right and left heart cath.

## 2023-04-18 NOTE — Assessment & Plan Note (Signed)
Stable per most recent labs. Reassurance provided.

## 2023-04-18 NOTE — Assessment & Plan Note (Signed)
Upcoming cath. Bumex refilled.

## 2023-04-18 NOTE — Progress Notes (Signed)
Subjective:  Patient ID: Deanna Thompson, female    DOB: 1956/01/30  Age: 68 y.o. MRN: [id number]  CC:   Chief Complaint  Patient presents with   kidney concerns    Referred by Rheumatologist due to recent labs. Elevated creatinine.     HPI:  68 year old female presents for evaluation of the above.  Patient has known CKD-3. Was advised to see me after recent labs from Rheumatology revealed a creatinine of 1.52. Office visit with Rheumatology was on 1/14. Has had preop labs for upcoming Catherization on 1/16. On 1/16 Creatinine was at baseline.  Patient is overall feeling well. Anxious about upcoming procedure.  Patient Active Problem List   Diagnosis Date Noted   Chronic CHF (congestive heart failure) (HCC) 06/16/2022   Incisional hernia, without obstruction or gangrene 11/10/2021   Bipolar disorder (HCC) 01/13/2021   Hyperlipidemia 01/13/2021   IBS (irritable bowel syndrome)    History of stroke 08/01/2017   Essential hypertension 07/31/2017   GERD (gastroesophageal reflux disease) 07/31/2017   Brain aneurysm 07/31/2017   CKD (chronic kidney disease), stage III (HCC) 07/31/2017   LBBB (left bundle branch block) 07/31/2017   Psoriatic arthritis (HCC) 04/15/2015   Cirrhosis of liver (HCC) 09/19/2014   Iron deficiency anemia due to chronic blood loss    Insomnia 03/17/2014    Social Hx   Social History   Socioeconomic History   Marital status: Married    Spouse name: Transport planner   Number of children: 0   Years of education: Not on file   Highest education level: Master's degree (e.g., MA, MS, MEng, MEd, MSW, MBA)  Occupational History    Employer: RETIRED  Tobacco Use   Smoking status: Former    Current packs/day: 0.00    Average packs/day: 2.0 packs/day for 12.0 years (24.0 ttl pk-yrs)    Types: Cigarettes    Start date: 03/31/1983    Quit date: 03/31/1995    Years since quitting: 28.0   Smokeless tobacco: Never   Tobacco comments:    quit in 1997  Vaping Use    Vaping status: Never Used  Substance and Sexual Activity   Alcohol use: No    Comment: Bottle of wine a day for years, but quit in 07/2011   Drug use: No   Sexual activity: Not Currently    Birth control/protection: Post-menopausal  Other Topics Concern   Not on file  Social History Narrative   Lives   Caffeine use:    Quit drinking alcohol 3 YEARS ago. No drugs, no IVDU.    Social Drivers of Corporate investment banker Strain: Low Risk  (04/15/2023)   Overall Financial Resource Strain (CARDIA)    Difficulty of Paying Living Expenses: Not hard at all  Food Insecurity: No Food Insecurity (04/15/2023)   Hunger Vital Sign    Worried About Running Out of Food in the Last Year: Never true    Ran Out of Food in the Last Year: Never true  Transportation Needs: No Transportation Needs (07/21/2022)   PRAPARE - Administrator, Civil Service (Medical): No    Lack of Transportation (Non-Medical): No  Physical Activity: Insufficiently Active (04/15/2023)   Exercise Vital Sign    Days of Exercise per Week: 3 days    Minutes of Exercise per Session: 20 min  Stress: Stress Concern Present (04/15/2023)   [employer] of Occupational Health - Occupational Stress Questionnaire    Feeling of Stress : Very much  Social  Connections: Moderately Isolated (04/15/2023)   Social Connection and Isolation Panel [NHANES]    Frequency of Communication with Friends and Family: More than three times a week    Frequency of Social Gatherings with Friends and Family: Twice a week    Attends Religious Services: Never    Database administrator or Organizations: No    Attends Banker Meetings: Never    Marital Status: Married    Review of Systems  Cardiovascular:  Negative for chest pain.    Objective:  BP 110/78   Pulse 86   Temp (!) 97.2 F (36.2 C)   Ht 5\' 8"  (1.727 m)   Wt 232 lb (105.2 kg)   SpO2 97%   BMI 35.28 kg/m      04/16/2023   11:22 AM 04/12/2023    3:41 PM  03/11/2023    3:54 PM  BP/Weight  Systolic BP 110 127 142  Diastolic BP 78 64 77  Wt. (Lbs) 232 229 232  BMI 35.28 kg/m2 34.82 kg/m2 35.28 kg/m2    Physical Exam Constitutional:      General: She is not in acute distress.    Appearance: Normal appearance.  Cardiovascular:     Rate and Rhythm: Normal rate and regular rhythm.  Pulmonary:     Effort: Pulmonary effort is normal.     Breath sounds: Normal breath sounds.  Neurological:     Mental Status: She is alert.  Psychiatric:        Mood and Affect: Mood normal.        Behavior: Behavior normal.     Lab Results  Component Value Date   WBC 5.7 04/15/2023   HGB 13.3 04/15/2023   HCT 39.3 04/15/2023   PLT 274 04/15/2023   GLUCOSE 110 (H) 04/15/2023   CHOL 160 01/14/2021   TRIG 126 01/14/2021   HDL 79 01/14/2021   LDLCALC 59 01/14/2021   ALT 22 08/20/2022   AST 25 08/20/2022   NA 137 04/15/2023   K 3.8 04/15/2023   CL 104 04/15/2023   CREATININE 1.28 (H) 04/15/2023   BUN 23 04/15/2023   CO2 25 04/15/2023   TSH 8.256 (H) 08/25/2011   INR 1.2 (H) 08/20/2022   HGBA1C 5.8 (H) 08/01/2017     Assessment & Plan:   Problem List Items Addressed This Visit       Cardiovascular and Mediastinum   Chronic CHF (congestive heart failure) (HCC)   Upcoming cath. Bumex refilled.      Relevant Medications   bumetanide (BUMEX) 1 MG tablet     Genitourinary   CKD (chronic kidney disease), stage III (HCC) - Primary   Stable per most recent labs. Reassurance provided.      Relevant Orders   POCT URINALYSIS DIP (CLINITEK) (Completed)    Meds ordered this encounter  Medications   bumetanide (BUMEX) 1 MG tablet    Sig: TAKE (1) TABLET BY MOUTH TWICE DAILY.    Dispense:  180 tablet    Refill:  1    Mischa Brittingham DO [facility]

## 2023-04-22 ENCOUNTER — Ambulatory Visit (HOSPITAL_COMMUNITY)
Admission: RE | Admit: 2023-04-22 | Discharge: 2023-04-22 | Disposition: A | Discharge status: Home / Self Care | Payer: Medicare HMO | Attending: Cardiology | Admitting: Cardiology

## 2023-04-22 ENCOUNTER — Other Ambulatory Visit: Payer: Self-pay

## 2023-04-22 ENCOUNTER — Encounter (HOSPITAL_COMMUNITY)
Admission: RE | Disposition: A | Discharge status: Home / Self Care | Payer: Self-pay | Source: Home / Self Care | Attending: Cardiology

## 2023-04-22 DIAGNOSIS — I428 Other cardiomyopathies: Secondary | ICD-10-CM | POA: Diagnosis present

## 2023-04-22 DIAGNOSIS — I352 Nonrheumatic aortic (valve) stenosis with insufficiency: Secondary | ICD-10-CM | POA: Insufficient documentation

## 2023-04-22 DIAGNOSIS — Z8679 Personal history of other diseases of the circulatory system: Secondary | ICD-10-CM | POA: Diagnosis not present

## 2023-04-22 DIAGNOSIS — N1831 Chronic kidney disease, stage 3a: Secondary | ICD-10-CM | POA: Insufficient documentation

## 2023-04-22 DIAGNOSIS — R0609 Other forms of dyspnea: Secondary | ICD-10-CM | POA: Diagnosis not present

## 2023-04-22 DIAGNOSIS — I447 Left bundle-branch block, unspecified: Secondary | ICD-10-CM | POA: Insufficient documentation

## 2023-04-22 DIAGNOSIS — I6523 Occlusion and stenosis of bilateral carotid arteries: Secondary | ICD-10-CM

## 2023-04-22 DIAGNOSIS — I5020 Unspecified systolic (congestive) heart failure: Secondary | ICD-10-CM | POA: Diagnosis not present

## 2023-04-22 DIAGNOSIS — F319 Bipolar disorder, unspecified: Secondary | ICD-10-CM | POA: Insufficient documentation

## 2023-04-22 DIAGNOSIS — I130 Hypertensive heart and chronic kidney disease with heart failure and stage 1 through stage 4 chronic kidney disease, or unspecified chronic kidney disease: Secondary | ICD-10-CM | POA: Insufficient documentation

## 2023-04-22 DIAGNOSIS — I429 Cardiomyopathy, unspecified: Secondary | ICD-10-CM

## 2023-04-22 DIAGNOSIS — F419 Anxiety disorder, unspecified: Secondary | ICD-10-CM | POA: Diagnosis not present

## 2023-04-22 DIAGNOSIS — I5042 Chronic combined systolic (congestive) and diastolic (congestive) heart failure: Secondary | ICD-10-CM | POA: Insufficient documentation

## 2023-04-22 DIAGNOSIS — I2510 Atherosclerotic heart disease of native coronary artery without angina pectoris: Secondary | ICD-10-CM

## 2023-04-22 HISTORY — PX: RIGHT/LEFT HEART CATH AND CORONARY ANGIOGRAPHY: CATH118266

## 2023-04-22 LAB — POCT I-STAT EG7
Acid-Base Excess: 2 mmol/L (ref 0.0–2.0)
Acid-Base Excess: 2 mmol/L (ref 0.0–2.0)
Bicarbonate: 25.9 mmol/L (ref 20.0–28.0)
Bicarbonate: 25.9 mmol/L (ref 20.0–28.0)
Calcium, Ion: 1.16 mmol/L (ref 1.15–1.40)
Calcium, Ion: 1.17 mmol/L (ref 1.15–1.40)
HCT: 39 % (ref 36.0–46.0)
HCT: 40 % (ref 36.0–46.0)
Hemoglobin: 13.3 g/dL (ref 12.0–15.0)
Hemoglobin: 13.6 g/dL (ref 12.0–15.0)
O2 Saturation: 56 %
O2 Saturation: 61 %
Potassium: 3.8 mmol/L (ref 3.5–5.1)
Potassium: 3.8 mmol/L (ref 3.5–5.1)
Sodium: 143 mmol/L (ref 135–145)
Sodium: 143 mmol/L (ref 135–145)
TCO2: 27 mmol/L (ref 22–32)
TCO2: 27 mmol/L (ref 22–32)
pCO2, Ven: 36.3 mm[Hg] — ABNORMAL LOW (ref 44–60)
pCO2, Ven: 36.6 mm[Hg] — ABNORMAL LOW (ref 44–60)
pH, Ven: 7.458 — ABNORMAL HIGH (ref 7.25–7.43)
pH, Ven: 7.462 — ABNORMAL HIGH (ref 7.25–7.43)
pO2, Ven: 28 mm[Hg] — CL (ref 32–45)
pO2, Ven: 30 mm[Hg] — CL (ref 32–45)

## 2023-04-22 SURGERY — RIGHT/LEFT HEART CATH AND CORONARY ANGIOGRAPHY
Anesthesia: LOCAL

## 2023-04-22 MED ORDER — LIDOCAINE HCL (PF) 1 % IJ SOLN
INTRAMUSCULAR | Status: DC | PRN
  Administered 2023-04-22 (×2): 2 mL

## 2023-04-22 MED ORDER — HEPARIN SODIUM (PORCINE) 1000 UNIT/ML IJ SOLN
INTRAMUSCULAR | Status: DC | PRN
  Administered 2023-04-22: 5000 [IU] via INTRAVENOUS

## 2023-04-22 MED ORDER — ASPIRIN 81 MG PO CHEW: 81.0000 mg | CHEWABLE_TABLET | ORAL | Status: DC

## 2023-04-22 MED ORDER — MIDAZOLAM HCL 2 MG/2ML IJ SOLN
INTRAMUSCULAR | Status: AC
  Filled 2023-04-22: qty 2

## 2023-04-22 MED ORDER — VERAPAMIL HCL 2.5 MG/ML IV SOLN
INTRAVENOUS | Status: DC | PRN
  Administered 2023-04-22: 10 mL via INTRA_ARTERIAL

## 2023-04-22 MED ORDER — FENTANYL CITRATE (PF) 100 MCG/2ML IJ SOLN
INTRAMUSCULAR | Status: AC
  Filled 2023-04-22: qty 2

## 2023-04-22 MED ORDER — IOHEXOL 350 MG/ML SOLN
INTRAVENOUS | Status: DC | PRN
  Administered 2023-04-22: 45 mL

## 2023-04-22 MED ORDER — HEPARIN (PORCINE) IN NACL 1000-0.9 UT/500ML-% IV SOLN
INTRAVENOUS | Status: DC | PRN
  Administered 2023-04-22: 1000 mL

## 2023-04-22 MED ORDER — VERAPAMIL HCL 2.5 MG/ML IV SOLN
INTRAVENOUS | Status: AC
Start: 2023-04-22 — End: ?
  Filled 2023-04-22: qty 2

## 2023-04-22 MED ORDER — HEPARIN SODIUM (PORCINE) 1000 UNIT/ML IJ SOLN
INTRAMUSCULAR | Status: AC
Start: 2023-04-22 — End: ?
  Filled 2023-04-22: qty 10

## 2023-04-22 MED ORDER — SODIUM CHLORIDE 0.9 % IV SOLN: INTRAVENOUS | Status: DC

## 2023-04-22 MED ORDER — MIDAZOLAM HCL 2 MG/2ML IJ SOLN
INTRAMUSCULAR | Status: DC | PRN
  Administered 2023-04-22: 2 mg via INTRAVENOUS

## 2023-04-22 MED ORDER — SODIUM CHLORIDE 0.9 % IV SOLN: 250.0000 mL | INTRAVENOUS | Status: DC | PRN

## 2023-04-22 MED ORDER — LIDOCAINE HCL (PF) 1 % IJ SOLN
INTRAMUSCULAR | Status: AC
Start: 2023-04-22 — End: ?
  Filled 2023-04-22: qty 30

## 2023-04-22 MED ORDER — ACETAMINOPHEN 325 MG PO TABS
650.0000 mg | ORAL_TABLET | ORAL | Status: DC | PRN
  Administered 2023-04-22: 350 mg via ORAL
  Filled 2023-04-22: qty 2

## 2023-04-22 MED ORDER — SODIUM CHLORIDE 0.9% FLUSH: 3.0000 mL | INTRAVENOUS | Status: DC | PRN

## 2023-04-22 MED ORDER — FENTANYL CITRATE (PF) 100 MCG/2ML IJ SOLN
INTRAMUSCULAR | Status: DC | PRN
  Administered 2023-04-22: 50 ug via INTRAVENOUS

## 2023-04-22 MED ORDER — ASPIRIN 81 MG PO CHEW
81.0000 mg | CHEWABLE_TABLET | ORAL | Status: AC
  Administered 2023-04-22: 81 mg via ORAL
  Filled 2023-04-22: qty 1

## 2023-04-22 MED ORDER — ONDANSETRON HCL 4 MG/2ML IJ SOLN: 4.0000 mg | Freq: Four times a day (QID) | INTRAMUSCULAR | Status: DC | PRN

## 2023-04-22 SURGICAL SUPPLY — 9 items
CATH BALLN WEDGE 5F 110CM (CATHETERS) IMPLANT
CATH INFINITI AMBI 5FR TG (CATHETERS) IMPLANT
DEVICE RAD TR BAND REGULAR (VASCULAR PRODUCTS) IMPLANT
GLIDESHEATH SLEND A-KIT 6F 22G (SHEATH) IMPLANT
GUIDEWIRE INQWIRE 1.5J.035X260 (WIRE) IMPLANT
INQWIRE 1.5J .035X260CM (WIRE) ×1
PACK CARDIAC CATHETERIZATION (CUSTOM PROCEDURE TRAY) ×2 IMPLANT
SET ATX-X65L (MISCELLANEOUS) IMPLANT
SHEATH GLIDE SLENDER 4/5FR (SHEATH) IMPLANT

## 2023-04-22 NOTE — Progress Notes (Signed)
TR band removed at 1245, gauze dressing applied. Right radial level 0, clean, dry, and intact. Patient walked to the bathroom without difficulties.

## 2023-04-22 NOTE — Interval H&P Note (Signed)
History and Physical Interval Note:  04/22/2023 9:28 AM  [facility]  has presented today for surgery, with the diagnosis of cardiomyopathy.  The various methods of treatment have been discussed with the patient and family. After consideration of risks, benefits and other options for treatment, the patient has consented to  Procedure(s): RIGHT/LEFT HEART CATH AND CORONARY ANGIOGRAPHY (N/A) and possible coronary angioplasty as a surgical intervention.  The patient's history has been reviewed, patient examined, no change in status, stable for surgery.  I have reviewed the patient's chart and labs.  Questions were answered to the patient's satisfaction.    Cath Lab Visit (complete for each Cath Lab visit)  Clinical Evaluation Leading to the Procedure:   ACS: No.  Non-ACS:    Anginal Classification: CCS II  Anti-ischemic medical therapy: No Therapy  Non-Invasive Test Results: No non-invasive testing performed  Prior CABG: No previous CABG  Patient is aware that only if she has major vessel disease or proximal vessel disease that would explain her decreased LVEF we will proceed with coronary  angioplasty, otherwise medical therapy.  Husband present and all questions answered.   Yates Decamp

## 2023-04-22 NOTE — Progress Notes (Signed)
Patient Name: Deanna Thompson Date of Encounter: 04/22/2023 [facility] Cardiologist: Dina Rich, MD  04/22/2023 .admit Length of stay: 0  Interval Summary  .    Deanna Thompson is a 68 y.o. 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  She has mild chronic dyspnea on exertion.  She had undergone outpatient echocardiogram in September 2024 revealing decreased LVEF from 45% to 30 to 35% and entire anteroapical segment being hypokinetic.  Hence with a suspicion of proximal LAD stenosis, major vessel coronary artery disease, she was scheduled for cardiac catheterization today  I met the patient and her husband at the bedside, reviewed her chart, discussed at entirety the procedure and explained risks and benefits.  Patient is aware and is willing to proceed.  Physical Exam    Vitals:   04/22/23 0824  BP: 133/78  Pulse: 76  Resp: 18  Temp: 98.7 F (37.1 C)  TempSrc: Oral  SpO2: 97%  Weight: 103.4 kg  Height: 5\' 8"  (1.727 m)   Physical Exam Constitutional:      Appearance: She is obese.  Neck:     Vascular: No carotid bruit or JVD.  Cardiovascular:     Rate and Rhythm: Normal rate and regular rhythm.     Heart sounds: Normal heart sounds. No murmur heard.    No gallop.  Pulmonary:     Effort: Pulmonary effort is normal.     Breath sounds: Normal breath sounds.  Abdominal:     General: Bowel sounds are normal.     Palpations: Abdomen is soft.  Musculoskeletal:     Right lower leg: No edema.     Left lower leg: No edema.       04/22/2023    8:24 AM 04/16/2023   11:22 AM 04/12/2023     3:41 PM  Last 3 Weights  Weight (lbs) 228 lb 232 lb 229 lb  Weight (kg) 103.42 kg 105.235 kg 103.874 kg      Labs   Lab Results  Component Value Date   NA 137 04/15/2023   K 3.8 04/15/2023   CO2 25 04/15/2023   GLUCOSE 110 (H) 04/15/2023   BUN 23 04/15/2023   CREATININE 1.28 (H) 04/15/2023   CALCIUM 9.3 04/15/2023   GFR 38.80 (L) 08/20/2022   EGFR 38.0 04/13/2023   GFRNONAA 46 (L) 04/15/2023       Latest Ref Rng & Units 04/15/2023    4:00 PM 08/20/2022   11:21 AM 07/30/2021   11:23 AM  BMP  Glucose 70 - 99 mg/dL 811  84  914   BUN 8 - 23 mg/dL 23  16  16    Creatinine 0.44 - 1.00 mg/dL 7.82  9.56  2.13   Sodium 135 - 145 mmol/L 137  140  138   Potassium 3.5 - 5.1 mmol/L 3.8  4.1  3.7   Chloride 98 - 111 mmol/L 104  101  103   CO2 22 - 32 mmol/L 25  31  23    Calcium 8.9 - 10.3 mg/dL 9.3  08.6  57.8        Latest Ref Rng & Units 04/15/2023    4:00 PM  01/13/2023   10:50 AM 10/06/2022    1:54 PM  CBC  WBC 4.0 - 10.5 K/uL 5.7  7.5  7.6   Hemoglobin 12.0 - 15.0 g/dL 78.2  95.6  21.3   Hematocrit 36.0 - 46.0 % 39.3  43.7  41.3   Platelets 150 - 400 K/uL 274  303  334     Lab Results  Component Value Date   CHOL 160 01/14/2021   HDL 79 01/14/2021   LDLCALC 59 01/14/2021   TRIG 126 01/14/2021   CHOLHDL 2.0 01/14/2021    Lab Results  Component Value Date   TSH 8.256 (H) 08/25/2011    Lab Results  Component Value Date   HGBA1C 5.8 (H) 08/01/2017   Tele/EKG/Cardiac studies    ECHOCARDIOGRAM COMPLETE 01/14/2023 1. Left ventricular ejection fraction, by estimation, is 30 to 35%. The left ventricle has moderately decreased function. The left ventricle demonstrates regional wall motion abnormalities: The entire septum is akinetic. The entire anterior wall and apex are hypokinetic.Marland Kitchen Left ventricular  diastolic parameters are consistent with Grade I diastolic dysfunction (impaired relaxation).  2. Right ventricular systolic function is normal. The right ventricular  size is normal. There is normal pulmonary artery systolic pressure.  3. The mitral valve is normal in structure. Trivial mitral valve regurgitation. No evidence of mitral stenosis.  4. The tricuspid valve is abnormal.  5. The aortic valve has an indeterminant number of cusps. There is mild calcification of the aortic valve. There is mild thickening of the aortic valve. Aortic valve regurgitation is not visualized. No aortic stenosis is present.  6. The inferior vena cava is normal in size with greater than 50% respiratory variability, suggesting right atrial pressure of 3 mmHg.  Current Meds:    Current Meds  Medication Sig  . acetaminophen (TYLENOL) 500 MG tablet Take 500 mg by mouth every 6 (six) hours as needed for moderate pain (pain score 4-6).  Marland Kitchen aspirin EC 81 MG tablet Take 81 mg by mouth 3 (three) times a week. Swallow whole.  Marland Kitchen atorvastatin (LIPITOR) 40 MG tablet TAKE ONE TABLET BY MOUTH ONCE DAILY.  . bumetanide (BUMEX) 1 MG tablet TAKE (1) TABLET BY MOUTH TWICE DAILY.  Marland Kitchen buPROPion (WELLBUTRIN XL) 300 MG 24 hr tablet Take 300 mg by mouth daily.  . cholecalciferol (VITAMIN D3) 25 MCG (1000 UT) tablet Take 2 tablets (2,000 Units total) by mouth daily.  . DULoxetine (CYMBALTA) 60 MG capsule Take 60 mg by mouth daily.  . famotidine (PEPCID) 20 MG tablet Take 1 tablet (20 mg total) by mouth 2 (two) times daily before a meal.  . fluticasone (FLONASE) 50 MCG/ACT nasal spray Place 1 spray into both nostrils See admin instructions. Instill 1 spray into each nostril every morning, may also use later in the day as needed for sinus congestion  . lamoTRIgine (LAMICTAL) 200 MG tablet Take 400 mg by mouth daily.  Marland Kitchen linaclotide (LINZESS) 145 MCG CAPS capsule Take 1 capsule (145 mcg total) by mouth daily. Take 30 minutes before a meal  . loratadine (CLARITIN) 10 MG tablet Take 10 mg by mouth daily as needed for allergies.  . Polyvinyl Alcohol-Povidone (REFRESH OP) Place 1 drop into both eyes 3 (three)  times daily as needed (dry eyes).  . Simethicone (GAS-X PO) Take 1 tablet by mouth 2 (two) times daily as needed (bloating/flatulence).  . sodium chloride (OCEAN) 0.65 % nasal spray Place 1 spray into the nose 2 (two) times daily as needed for congestion.  Marland Kitchen  spironolactone (ALDACTONE) 100 MG tablet TAKE 1/2 TABLET BY MOUTH TWICE DAILY.  Marland Kitchen Tofacitinib Citrate ER (XELJANZ XR) 11 MG TB24 Take 1 tablet by mouth daily.  Marland Kitchen XIIDRA 5 % SOLN Apply 1 drop to eye 2 (two) times daily.  . [DISCONTINUED] bumetanide (BUMEX) 1 MG tablet TAKE (1) TABLET BY MOUTH TWICE DAILY.     Current Facility-Administered Medications:  .  0.9 %  sodium chloride infusion, , Intravenous, Continuous, Sharlene Dory, NP  Assessment & Plan .     1.  Chronic systolic and diastolic heart failure 2.  Dyspnea on exertion class I-II   For questions or updates, please contact [facility] Please consult [email or web address] for contact info under        Signed, Yates Decamp, MD, [facility] 04/22/2023, 9:20 AM [facility] [address] [place], [place] [address] Phone: [phone]. Fax:  [phone]  Cell: [phone]

## 2023-04-22 NOTE — H&P (View-Only) (Signed)
Patient Name: Deanna Thompson Date of Encounter: 04/22/2023 [facility] Cardiologist: Dina Rich, MD  04/22/2023 .admit Length of stay: 0  Interval Summary  .    [place] Deanna Thompson is a 68 y.o. 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  She has mild chronic dyspnea on exertion.  She had undergone outpatient echocardiogram in September 2024 revealing decreased LVEF from 45% to 30 to 35% and entire anteroapical segment being hypokinetic.  Hence with a suspicion of proximal LAD stenosis, major vessel coronary artery disease, she was scheduled for cardiac catheterization today  I met the patient and her husband at the bedside, reviewed her chart, discussed at entirety the procedure and explained risks and benefits.  Patient is aware and is willing to proceed.  Physical Exam    Vitals:   04/22/23 0824  BP: 133/78  Pulse: 76  Resp: 18  Temp: 98.7 F (37.1 C)  TempSrc: Oral  SpO2: 97%  Weight: 103.4 kg  Height: 5\' 8"  (1.727 m)   Physical Exam Constitutional:      Appearance: She is obese.  Neck:     Vascular: No carotid bruit or JVD.  Cardiovascular:     Rate and Rhythm: Normal rate and regular rhythm.     Heart sounds: Normal heart sounds. No murmur heard.    No gallop.  Pulmonary:     Effort: Pulmonary effort is normal.     Breath sounds: Normal breath sounds.  Abdominal:     General: Bowel sounds are normal.     Palpations: Abdomen is soft.  Musculoskeletal:     Right lower leg: No edema.     Left lower leg: No edema.       04/22/2023    8:24 AM 04/16/2023   11:22 AM 04/12/2023     3:41 PM  Last 3 Weights  Weight (lbs) 228 lb 232 lb 229 lb  Weight (kg) 103.42 kg 105.235 kg 103.874 kg      Labs   Lab Results  Component Value Date   NA 137 04/15/2023   K 3.8 04/15/2023   CO2 25 04/15/2023   GLUCOSE 110 (H) 04/15/2023   BUN 23 04/15/2023   CREATININE 1.28 (H) 04/15/2023   CALCIUM 9.3 04/15/2023   GFR 38.80 (L) 08/20/2022   EGFR 38.0 04/13/2023   GFRNONAA 46 (L) 04/15/2023       Latest Ref Rng & Units 04/15/2023    4:00 PM 08/20/2022   11:21 AM 07/30/2021   11:23 AM  BMP  Glucose 70 - 99 mg/dL 811  84  914   BUN 8 - 23 mg/dL 23  16  16    Creatinine 0.44 - 1.00 mg/dL 7.82  9.56  2.13   Sodium 135 - 145 mmol/L 137  140  138   Potassium 3.5 - 5.1 mmol/L 3.8  4.1  3.7   Chloride 98 - 111 mmol/L 104  101  103   CO2 22 - 32 mmol/L 25  31  23    Calcium 8.9 - 10.3 mg/dL 9.3  08.6  57.8        Latest Ref Rng & Units 04/15/2023    4:00 PM  01/13/2023   10:50 AM 10/06/2022    1:54 PM  CBC  WBC 4.0 - 10.5 K/uL 5.7  7.5  7.6   Hemoglobin 12.0 - 15.0 g/dL 78.2  95.6  21.3   Hematocrit 36.0 - 46.0 % 39.3  43.7  41.3   Platelets 150 - 400 K/uL 274  303  334     Lab Results  Component Value Date   CHOL 160 01/14/2021   HDL 79 01/14/2021   LDLCALC 59 01/14/2021   TRIG 126 01/14/2021   CHOLHDL 2.0 01/14/2021    Lab Results  Component Value Date   TSH 8.256 (H) 08/25/2011    Lab Results  Component Value Date   HGBA1C 5.8 (H) 08/01/2017   Tele/EKG/Cardiac studies    ECHOCARDIOGRAM COMPLETE 01/14/2023 1. Left ventricular ejection fraction, by estimation, is 30 to 35%. The left ventricle has moderately decreased function. The left ventricle demonstrates regional wall motion abnormalities: The entire septum is akinetic. The entire anterior wall and apex are hypokinetic.Marland Kitchen Left ventricular  diastolic parameters are consistent with Grade I diastolic dysfunction (impaired relaxation).  2. Right ventricular systolic function is normal. The right ventricular  size is normal. There is normal pulmonary artery systolic pressure.  3. The mitral valve is normal in structure. Trivial mitral valve regurgitation. No evidence of mitral stenosis.  4. The tricuspid valve is abnormal.  5. The aortic valve has an indeterminant number of cusps. There is mild calcification of the aortic valve. There is mild thickening of the aortic valve. Aortic valve regurgitation is not visualized. No aortic stenosis is present.  6. The inferior vena cava is normal in size with greater than 50% respiratory variability, suggesting right atrial pressure of 3 mmHg.  Current Meds:    Current Meds  Medication Sig  . acetaminophen (TYLENOL) 500 MG tablet Take 500 mg by mouth every 6 (six) hours as needed for moderate pain (pain score 4-6).  Marland Kitchen aspirin EC 81 MG tablet Take 81 mg by mouth 3 (three) times a week. Swallow whole.  Marland Kitchen atorvastatin (LIPITOR) 40 MG tablet TAKE ONE TABLET BY MOUTH ONCE DAILY.  . bumetanide (BUMEX) 1 MG tablet TAKE (1) TABLET BY MOUTH TWICE DAILY.  Marland Kitchen buPROPion (WELLBUTRIN XL) 300 MG 24 hr tablet Take 300 mg by mouth daily.  . cholecalciferol (VITAMIN D3) 25 MCG (1000 UT) tablet Take 2 tablets (2,000 Units total) by mouth daily.  . DULoxetine (CYMBALTA) 60 MG capsule Take 60 mg by mouth daily.  . famotidine (PEPCID) 20 MG tablet Take 1 tablet (20 mg total) by mouth 2 (two) times daily before a meal.  . fluticasone (FLONASE) 50 MCG/ACT nasal spray Place 1 spray into both nostrils See admin instructions. Instill 1 spray into each nostril every morning, may also use later in the day as needed for sinus congestion  . lamoTRIgine (LAMICTAL) 200 MG tablet Take 400 mg by mouth daily.  Marland Kitchen linaclotide (LINZESS) 145 MCG CAPS capsule Take 1 capsule (145 mcg total) by mouth daily. Take 30 minutes before a meal  . loratadine (CLARITIN) 10 MG tablet Take 10 mg by mouth daily as needed for allergies.  . Polyvinyl Alcohol-Povidone (REFRESH OP) Place 1 drop into both eyes 3 (three)  times daily as needed (dry eyes).  . Simethicone (GAS-X PO) Take 1 tablet by mouth 2 (two) times daily as needed (bloating/flatulence).  . sodium chloride (OCEAN) 0.65 % nasal spray Place 1 spray into the nose 2 (two) times daily as needed for congestion.  Marland Kitchen  spironolactone (ALDACTONE) 100 MG tablet TAKE 1/2 TABLET BY MOUTH TWICE DAILY.  Marland Kitchen Tofacitinib Citrate ER (XELJANZ XR) 11 MG TB24 Take 1 tablet by mouth daily.  Marland Kitchen XIIDRA 5 % SOLN Apply 1 drop to eye 2 (two) times daily.  . [DISCONTINUED] bumetanide (BUMEX) 1 MG tablet TAKE (1) TABLET BY MOUTH TWICE DAILY.     Current Facility-Administered Medications:  .  0.9 %  sodium chloride infusion, , Intravenous, Continuous, Sharlene Dory, NP  Assessment & Plan .     1.  Chronic systolic and diastolic heart failure 2.  Dyspnea on exertion class I-II   For questions or updates, please contact [facility] Please consult [email or web address] for contact info under        Signed, Yates Decamp, MD, [facility] 04/22/2023, 9:20 AM [facility] [address] [place], [place] [address] Phone: [phone]. Fax:  [phone]  Cell: [phone]

## 2023-04-22 NOTE — Discharge Instructions (Signed)

## 2023-04-23 ENCOUNTER — Encounter (HOSPITAL_COMMUNITY): Payer: Self-pay | Admitting: Cardiology

## 2023-04-28 ENCOUNTER — Inpatient Hospital Stay: Payer: Medicare HMO | Attending: Hematology

## 2023-04-28 DIAGNOSIS — D500 Iron deficiency anemia secondary to blood loss (chronic): Secondary | ICD-10-CM | POA: Diagnosis present

## 2023-04-28 DIAGNOSIS — K31811 Angiodysplasia of stomach and duodenum with bleeding: Secondary | ICD-10-CM | POA: Insufficient documentation

## 2023-04-28 DIAGNOSIS — Z79899 Other long term (current) drug therapy: Secondary | ICD-10-CM | POA: Diagnosis not present

## 2023-04-28 LAB — CBC WITH DIFFERENTIAL/PLATELET
Abs Immature Granulocytes: 0.01 10*3/uL (ref 0.00–0.07)
Basophils Absolute: 0 10*3/uL (ref 0.0–0.1)
Basophils Relative: 1 %
Eosinophils Absolute: 0.2 10*3/uL (ref 0.0–0.5)
Eosinophils Relative: 3 %
HCT: 41.2 % (ref 36.0–46.0)
Hemoglobin: 13.6 g/dL (ref 12.0–15.0)
Immature Granulocytes: 0 %
Lymphocytes Relative: 19 %
Lymphs Abs: 1.1 10*3/uL (ref 0.7–4.0)
MCH: 31.1 pg (ref 26.0–34.0)
MCHC: 33 g/dL (ref 30.0–36.0)
MCV: 94.3 fL (ref 80.0–100.0)
Monocytes Absolute: 0.6 10*3/uL (ref 0.1–1.0)
Monocytes Relative: 9 %
Neutro Abs: 4.2 10*3/uL (ref 1.7–7.7)
Neutrophils Relative %: 68 %
Platelets: 287 10*3/uL (ref 150–400)
RBC: 4.37 MIL/uL (ref 3.87–5.11)
RDW: 14.4 % (ref 11.5–15.5)
WBC: 6.1 10*3/uL (ref 4.0–10.5)
nRBC: 0 % (ref 0.0–0.2)

## 2023-04-28 LAB — IRON AND TIBC
Iron: 67 ug/dL (ref 28–170)
Saturation Ratios: 17 % (ref 10.4–31.8)
TIBC: 397 ug/dL (ref 250–450)
UIBC: 330 ug/dL

## 2023-04-28 LAB — FERRITIN: Ferritin: 32 ng/mL (ref 11–307)

## 2023-05-04 NOTE — Progress Notes (Signed)
 VIRTUAL VISIT via TELEPHONE NOTE [facility]   I connected with Deanna Thompson  on 05/05/23 at  8:15 AM by telephone and verified that I am speaking with the correct person using two identifiers.  Location: Patient: Home Provider: [facility]   I discussed the limitations, risks, security and privacy concerns of performing an evaluation and management service by telephone and the availability of in person appointments. I also discussed with the patient that there may be a patient responsible charge related to this service. The patient expressed understanding and agreed to proceed.  REASON FOR VISIT: Iron  deficiency anemia (bleeding AVMs)   CURRENT THERAPY: Intermittent IV iron   INTERVAL HISTORY:  Deanna Thompson is contacted today for follow-up of her iron  deficiency anemia.SABRA  She was last seen evaluated via telemedicine visit by Pleasant Barefoot PA-C on 01/25/2023.     At today's visit, she reports feeling fairly well, apart from an exacerbation of her psoriatic arthritis.  She does report some increased fatigue. She also reports mild dyspnea on exertion.  She denies any pica, restless legs, headaches, chest pain, lightheadedness, or syncope.  She has a history of bleeding AVMs, but has not had any recent hematemesis, hematochezia, or melena. She has 50% energy and 100% appetite. She endorses that she is maintaining a stable weight.   REVIEW OF SYSTEMS:   Review of Systems  Constitutional:  Positive for malaise/fatigue. Negative for chills, diaphoresis, fever and weight loss.  Respiratory:  Positive for shortness of breath (with exertion). Negative for cough.   Cardiovascular:  Negative for chest pain and palpitations.  Gastrointestinal:  Positive for constipation (IBS-C). Negative for abdominal pain, blood in stool, melena, nausea and vomiting.  Neurological:  Positive for dizziness (vertigo). Negative for headaches.  Psychiatric/Behavioral:  The  patient is nervous/anxious (at times).      PHYSICAL EXAM: (per limitations of virtual telephone visit)  The patient is alert and oriented x 3, exhibiting adequate mentation, good mood, and ability to speak in full sentences and execute sound judgement.  ASSESSMENT & PLAN:  1.  Iron  deficiency anemia due to chronic blood loss - Due to chronic GI blood loss. - Patient was hospitalized in December 2020 for an AVM bleed where her hemoglobin dropped to 7.7. 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  Injected.  Treated with bipolar cautery.  Clips were placed. - EGD (06/04/2020): Esophageal nodule, gastritis.  No varices. - She has been transfused RBCs in the past - Most recent IV iron  with Venofer  300 mg x 3 in July/August 2024 - No bright red blood per rectum or melena - Most recent labs (04/28/2023): Hgb 13.6/MCV 94.3, ferritin 32, iron  saturation 17% - PLAN: IV Venofer  400 mg x 2 - Labs and RTC in 6 months   2.  Other history - Her other pertinent medical history includes CKD stage IIIa,  liver cirrhosis, history of cerebral aneurysm with stroke, hyperlipidemia, and hypertension      PLAN SUMMARY: >> IV Venofer  400 mg x 2 >> Labs in 6 months = CBC/D, ferritin, iron /TIBC >> OFFICE visit in 6 months (same day as labs okay, if patient prefers)  ** Last office visit 10/14/2022     I discussed the assessment and treatment plan with the patient. The patient was provided an opportunity to ask questions and all were answered. The patient agreed with the plan and demonstrated an understanding  of the instructions.   The patient was advised to call back or seek an in-person evaluation if the symptoms worsen or if the condition fails to improve as anticipated.  I provided 18 minutes of non-face-to-face time during this encounter, including >10 minutes of medical discussion.  Pleasant CHRISTELLA Barefoot, PA-C 05/05/23 8:37 AM

## 2023-05-05 ENCOUNTER — Inpatient Hospital Stay: Payer: Medicare HMO | Attending: Hematology | Admitting: Physician Assistant

## 2023-05-05 ENCOUNTER — Encounter (INDEPENDENT_AMBULATORY_CARE_PROVIDER_SITE_OTHER): Payer: Self-pay | Admitting: *Deleted

## 2023-05-05 DIAGNOSIS — N1831 Chronic kidney disease, stage 3a: Secondary | ICD-10-CM | POA: Diagnosis not present

## 2023-05-05 DIAGNOSIS — R42 Dizziness and giddiness: Secondary | ICD-10-CM | POA: Insufficient documentation

## 2023-05-05 DIAGNOSIS — L4050 Arthropathic psoriasis, unspecified: Secondary | ICD-10-CM | POA: Insufficient documentation

## 2023-05-05 DIAGNOSIS — R5383 Other fatigue: Secondary | ICD-10-CM | POA: Insufficient documentation

## 2023-05-05 DIAGNOSIS — D500 Iron deficiency anemia secondary to blood loss (chronic): Secondary | ICD-10-CM | POA: Insufficient documentation

## 2023-05-05 DIAGNOSIS — Z8673 Personal history of transient ischemic attack (TIA), and cerebral infarction without residual deficits: Secondary | ICD-10-CM | POA: Insufficient documentation

## 2023-05-05 DIAGNOSIS — I129 Hypertensive chronic kidney disease with stage 1 through stage 4 chronic kidney disease, or unspecified chronic kidney disease: Secondary | ICD-10-CM | POA: Insufficient documentation

## 2023-05-05 DIAGNOSIS — Z79899 Other long term (current) drug therapy: Secondary | ICD-10-CM | POA: Insufficient documentation

## 2023-05-05 DIAGNOSIS — R0609 Other forms of dyspnea: Secondary | ICD-10-CM | POA: Insufficient documentation

## 2023-05-05 DIAGNOSIS — Z8679 Personal history of other diseases of the circulatory system: Secondary | ICD-10-CM | POA: Insufficient documentation

## 2023-05-05 DIAGNOSIS — K31811 Angiodysplasia of stomach and duodenum with bleeding: Secondary | ICD-10-CM | POA: Insufficient documentation

## 2023-05-05 DIAGNOSIS — R0602 Shortness of breath: Secondary | ICD-10-CM | POA: Insufficient documentation

## 2023-05-05 DIAGNOSIS — K5900 Constipation, unspecified: Secondary | ICD-10-CM | POA: Insufficient documentation

## 2023-05-05 DIAGNOSIS — E785 Hyperlipidemia, unspecified: Secondary | ICD-10-CM | POA: Insufficient documentation

## 2023-05-11 ENCOUNTER — Ambulatory Visit: Payer: Medicare HMO | Admitting: Nurse Practitioner

## 2023-05-11 NOTE — Progress Notes (Deleted)
 Virtual Visit via Telephone Note   Because of [place] Rogus's co-morbid illnesses, she is at least at moderate risk for complications without adequate follow up.  This format is felt to be most appropriate for this patient at this time.  The patient did not have access to video technology/had technical difficulties with video requiring transitioning to audio format only (telephone).  All issues noted in this document were discussed and addressed.  No physical exam could be performed with this format.  Please refer to the patient's chart for her consent to telehealth for [facility].    Date:  04/12/2023  ID:  Deanna Thompson, DOB March 20, 1956, MRN [id number] The patient was identified using 2 identifiers. Patient Location: Home Provider Location: Office/Clinic PCP:  Tommie Sams, DO [facility] Providers Cardiologist:  Dina Rich, MD     Evaluation Performed:  Follow-Up Visit  Chief Complaint:  Here for follow-up.   History of Present Illness:    Deanna Thompson is a 68 y.o. 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  Last seen by Ronie Spies, PA-C on December 04, 2022 for follow-up.  Was found to be overall doing well from a cardiac perspective.  She noted chronic unchanged fatigue, felt to be multifactorial.  Denying chest pain or dyspnea.  Has started walking again at the time.  Noted chronic history of episodic dizziness related to vertigo.  Updated echo revealed EF worsening at 30 to 35%, was 45 to 50%.  Dr. Swaziland was consulted about whether to pursue cath for worsening LV  function.  Dr. Swaziland agreed that she would benefit from heart catheterization, right and left.  Recommended for patient to follow-up in office to discuss.  Recommended that patient touch base with Dr. Corliss Skains office regarding her previous brain aneurysm.  I saw her on February 09, 2023. Was doing well. Denied any chest pain. Stated she forgot to follow-up with Dr. Fatima Sanger office regarding her cerebral aneurysm. Stated the last measurement was 3 mm. She noted shortness of breath "only when thinking about it." Stated she does have shortness of breath with walking, was not all the time. Denied any  palpitations, syncope, presyncope, dizziness, orthopnea, PND, swelling or significant weight changes, acute bleeding, or claudication.  Seen by Dr. Corliss Skains on February 16, 2023. He arranged a CT angiogram of head in neck for further evaluation. See results below.   Today she presents for telehealth follow-up. Pt says overall she is doing well. Denies any chest pain, shortness of breath, palpitations, syncope, presyncope, dizziness, orthopnea, PND, swelling or significant weight changes, acute bleeding, or claudication.  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   ROS:   Please see the history of present illness.    All other systems reviewed and are negative.   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   Treated basilar tip aneurysm. No visible recurrent/residual aneurysm, although streak artifact limits assessment.   No large vessel occlusion.   Moderate distal left V2 vertebral artery stenosis.   Aortic Atherosclerosis (ICD10-I70.0).  Carotid duplex  01/2023: Summary:  Right Carotid: Velocities in the right ICA are consistent with a 1-39% stenosis. Non-hemodynamically significant plaque <50% noted in the CCA. The ECA appears <50%  stenosed.   Left Carotid: Velocities in the left ICA are consistent with a 1-39% stenosis. Non-hemodynamically significant plaque <50% noted in the CCA. The ECA appears <50% stenosed.   Vertebrals:  Bilateral vertebral arteries demonstrate antegrade flow. Subclavians: Normal flow hemodynamics were seen in bilateral subclavian arteries.   *See table(s) above for measurements and observations.  Echo 12/2022: 1. Left ventricular ejection fraction, by estimation, is 30 to 35%. The left ventricle has moderately decreased function. The left ventricle demonstrates regional wall motion abnormalities (see scoring diagram/findings for description). Left ventricular   diastolic parameters are consistent with Grade I diastolic dysfunction (impaired relaxation).   2. Right ventricular systolic function is normal. The right ventricular size is normal. There is normal pulmonary artery systolic pressure.   3. The mitral valve is normal in structure. Trivial mitral valve  regurgitation. No evidence of mitral stenosis.   4. The tricuspid valve is abnormal.   5. The aortic valve has an indeterminant number of cusps. There is mild calcification of the aortic valve. There is mild thickening of the aortic valve. Aortic valve regurgitation is not visualized. No aortic stenosis is present.   6. The inferior vena cava is normal in size with greater than 50% respiratory variability, suggesting right atrial pressure of 3 mmHg.  Lexiscan 11/2017: The left ventricular ejection fraction is mildly decreased (45-54%). Very severe gut radiotracer uptake in the resting and post-injection images, most significant in the post-injection images. Overall perfusion imaging is nondiagnostic. Low LVEF is suggested however looks normal by visual evaluation. Consider correlating with echo  Labs/Other Tests and Data Reviewed:    EKG:  EKG is not ordered today.      Objective:    Vital Signs:  There were no vitals taken for this  visit.   Due to nature of today's visit, a physical exam was unable to be performed.  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 Echo 12/2022 revealed EF reduced to 30-35% from 45-50%, unclear etiology. Ronie Spies, PA-C previously discussed case with Dr. Swaziland regarding ischemic evaluation (right and left cardiac cath) to further evaluate reduction in EF. Dr. Swaziland agreed with right and left cardiac catheterization. Discussed risks vs benefits of right and left cardiac catheterization with patient and she verbalized understanding and is agreeable to proceed. Case also discussed via secure chat with attending cardiologist, who agrees to proceed. Euvolemic and well compensated on exam. Continue Bumex and spironolactone. Was unable to tolerate Losartan as this caused hypotension in the past and says she has had a history of UTI's, previously discussed SGLT2i and came to shared medical decision to hold off starting SGLT2i d/t her history of UTI's in the past. No medication changes at this time. Low sodium diet, fluid restriction <2L, and daily weights encouraged. Educated to contact our office for weight gain of 2 lbs overnight or 5 lbs in one week. Care and ED precautions discussed. Will obtain CBC and BMET for her pre-procedure labs. Will also arrange visit to have EKG performed pre-procedure. Orders are under signed and held.  Informed Consent   Shared Decision Making/Informed Consent The risks [stroke (1 in 1000), death (1 in 1000), kidney failure [usually temporary] (1 in 500), bleeding (1 in 200), allergic reaction [possibly serious] (1 in 200)],  benefits (diagnostic support and management of coronary artery disease) and alternatives of a cardiac catheterization were discussed in detail with Ms. [facility] and she is willing to proceed.    2. Coronary artery calcification Denies any chest pain. Proceeding with right and left heart  cath as mentioned above. Continue current medication regimen. Heart healthy diet encouraged. Care and ED precautions discussed.  4. Carotid artery atherosclerosis/stenosis, vertebral artery stenosis Recent carotid Doppler revealed mild bilateral ICA stenosis at 1 to 39%. Recent CT angio of head and neck revealed moderate distal left V2 vertebral artery stenosis. Denies any concerning symptoms. Continue current medication regimen.   5. 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 Denies any concerning signs or symptoms. Care and ED precautions discussed.    6. Vertigo, poor balance Longstanding history per patient's report, denies any recent concerning symptoms. Continue to follow with PCP, discussed she may benefit from PT. Care and ED precautions discussed.   Time:   Today, I have spent 13 minutes with the patient with telehealth technology discussing the above problems.    Medication Adjustments/Labs and Tests Ordered: Current medicines are reviewed at length with the patient today.  Concerns regarding medicines are outlined above.   Tests Ordered: No orders of the defined types were placed in this encounter.   Medication Changes: No orders of the defined types were placed in this encounter.   Follow Up: in person in 3-4 weeks post cardiac catheterization with Dr. Wyline Mood or APP.   Signed, Sharlene Dory, NP

## 2023-05-12 ENCOUNTER — Inpatient Hospital Stay: Payer: Medicare HMO

## 2023-05-12 VITALS — BP 114/70 | HR 67 | Temp 96.8°F | Resp 20

## 2023-05-12 DIAGNOSIS — Z8673 Personal history of transient ischemic attack (TIA), and cerebral infarction without residual deficits: Secondary | ICD-10-CM | POA: Diagnosis not present

## 2023-05-12 DIAGNOSIS — R0602 Shortness of breath: Secondary | ICD-10-CM | POA: Diagnosis not present

## 2023-05-12 DIAGNOSIS — L4050 Arthropathic psoriasis, unspecified: Secondary | ICD-10-CM | POA: Diagnosis not present

## 2023-05-12 DIAGNOSIS — I129 Hypertensive chronic kidney disease with stage 1 through stage 4 chronic kidney disease, or unspecified chronic kidney disease: Secondary | ICD-10-CM | POA: Diagnosis not present

## 2023-05-12 DIAGNOSIS — R5383 Other fatigue: Secondary | ICD-10-CM | POA: Diagnosis not present

## 2023-05-12 DIAGNOSIS — D500 Iron deficiency anemia secondary to blood loss (chronic): Secondary | ICD-10-CM

## 2023-05-12 DIAGNOSIS — E785 Hyperlipidemia, unspecified: Secondary | ICD-10-CM | POA: Diagnosis not present

## 2023-05-12 DIAGNOSIS — K5900 Constipation, unspecified: Secondary | ICD-10-CM | POA: Diagnosis not present

## 2023-05-12 DIAGNOSIS — R0609 Other forms of dyspnea: Secondary | ICD-10-CM | POA: Diagnosis not present

## 2023-05-12 DIAGNOSIS — R42 Dizziness and giddiness: Secondary | ICD-10-CM | POA: Diagnosis not present

## 2023-05-12 DIAGNOSIS — K31811 Angiodysplasia of stomach and duodenum with bleeding: Secondary | ICD-10-CM | POA: Diagnosis present

## 2023-05-12 DIAGNOSIS — Z8679 Personal history of other diseases of the circulatory system: Secondary | ICD-10-CM | POA: Diagnosis not present

## 2023-05-12 DIAGNOSIS — Z79899 Other long term (current) drug therapy: Secondary | ICD-10-CM | POA: Diagnosis not present

## 2023-05-12 DIAGNOSIS — N1831 Chronic kidney disease, stage 3a: Secondary | ICD-10-CM | POA: Diagnosis not present

## 2023-05-12 MED ORDER — CETIRIZINE HCL 10 MG PO TABS: 10.0000 mg | ORAL_TABLET | Freq: Once | ORAL | Status: DC

## 2023-05-12 MED ORDER — ACETAMINOPHEN 325 MG PO TABS
650.0000 mg | ORAL_TABLET | Freq: Once | ORAL | Status: DC
Start: 2023-05-12 — End: 2023-05-12

## 2023-05-12 MED ORDER — SODIUM CHLORIDE 0.9 % IV SOLN: INTRAVENOUS | Status: DC

## 2023-05-12 MED ORDER — IRON SUCROSE 20 MG/ML IV SOLN
400.0000 mg | Freq: Once | INTRAVENOUS | Status: AC
  Administered 2023-05-12: 400 mg via INTRAVENOUS
  Filled 2023-05-12: qty 400

## 2023-05-12 NOTE — Patient Instructions (Signed)

## 2023-05-12 NOTE — Progress Notes (Signed)
Patient tolerated iron infusion with no complaints voiced.  Peripheral IV site clean and dry with good blood return noted before and after infusion.  Band aid applied.  Pt observed for 30 minutes post venofer infusion without any complications. VSS with discharge and left in satisfactory condition with no s/s of distress noted. All follow ups as scheduled.   Deanna Thompson [employer]

## 2023-05-13 ENCOUNTER — Other Ambulatory Visit (INDEPENDENT_AMBULATORY_CARE_PROVIDER_SITE_OTHER): Payer: Medicare HMO

## 2023-05-13 ENCOUNTER — Ambulatory Visit: Payer: Medicare HMO | Admitting: Nurse Practitioner

## 2023-05-13 ENCOUNTER — Encounter: Payer: Self-pay | Admitting: Nurse Practitioner

## 2023-05-13 VITALS — BP 118/62 | HR 72 | Ht 68.0 in | Wt 230.0 lb

## 2023-05-13 DIAGNOSIS — K7030 Alcoholic cirrhosis of liver without ascites: Secondary | ICD-10-CM | POA: Diagnosis not present

## 2023-05-13 DIAGNOSIS — M069 Rheumatoid arthritis, unspecified: Secondary | ICD-10-CM

## 2023-05-13 DIAGNOSIS — K5909 Other constipation: Secondary | ICD-10-CM | POA: Diagnosis not present

## 2023-05-13 DIAGNOSIS — F1091 Alcohol use, unspecified, in remission: Secondary | ICD-10-CM | POA: Diagnosis not present

## 2023-05-13 DIAGNOSIS — K219 Gastro-esophageal reflux disease without esophagitis: Secondary | ICD-10-CM

## 2023-05-13 LAB — COMPREHENSIVE METABOLIC PANEL
ALT: 25 U/L (ref 0–35)
AST: 28 U/L (ref 0–37)
Albumin: 4.9 g/dL (ref 3.5–5.2)
Alkaline Phosphatase: 97 U/L (ref 39–117)
BUN: 16 mg/dL (ref 6–23)
CO2: 27 meq/L (ref 19–32)
Calcium: 10.4 mg/dL (ref 8.4–10.5)
Chloride: 101 meq/L (ref 96–112)
Creatinine, Ser: 1.46 mg/dL — ABNORMAL HIGH (ref 0.40–1.20)
GFR: 37.34 mL/min — ABNORMAL LOW (ref >60.00)
Glucose, Bld: 121 mg/dL — ABNORMAL HIGH (ref 70–99)
Potassium: 4.2 meq/L (ref 3.5–5.1)
Sodium: 140 meq/L (ref 135–145)
Total Bilirubin: 0.6 mg/dL (ref 0.2–1.2)
Total Protein: 7.9 g/dL (ref 6.0–8.3)

## 2023-05-13 LAB — PROTIME-INR
INR: 1.2 {ratio} — ABNORMAL HIGH (ref 0.8–1.0)
Prothrombin Time: 12.5 s (ref 9.6–13.1)

## 2023-05-13 NOTE — Progress Notes (Signed)
I agree with the assessment and plan as outlined by Ms. Guenther.

## 2023-05-13 NOTE — Progress Notes (Signed)
ASSESSMENT    Brief Narrative:  68 y.o.  female known to Dr. Leonides Schanz with a past medical history not limited to IBS with chronic constipation, GERD, EtOH cirrhosis, Sjogren's syndrome, bipolar disorder, CVA, seizures, psoriatic arthritis, CKD 3a,  . See PMH for any additional medical & surgical history   Etoh cirrhosis without evidence for portal hypertension, compensated.  Remains abstinent from Etoh.  HCC screening:  Will update AFP. No liver lesions on [place] June 2024 .  Varices screening::   No portal gastropathy / varices on EGD March 2022.  Ascites:  No obvious ascites. On aldactone 50 mg BID  and also bumex. Creatinine stable at 1.28 last month.   Encephalopathy:   None  Chronic constipation Takes Linzess  Chronic GERD Manages well with Pepcid 20 mg BID  RA / Sjorgren's On Xeljanz  PLAN   --Due for 3 year surveillance EGD. The risks and benefits of EGD with possible biopsies were discussed with the patient who agrees to proceed. I will ask Anesthesia to review case. Patient's EF was 30-35% on Oct 2024 echo. However, R/L cardiac cath last month showed EF appearing to be around 45 %.  --AFP, CMET, INR. No need to repeat CBC as it was normal on 04/28/23. --Next annual RUQ for [facility] screening likely needed in June 2025 and can be scheduled at time of her 6 months follow up.  --Continue daily Linzess  --Continue Pepcid 20 mg BID   HPI   Chief complaint : Follow up on cirrhosis.   Deanna Thompson was last seen for routine follow-up Aug 20, 2022 by Dr. Leonides Schanz, please refer to that note for details.  She is here today for 79-month follow-up.  Deanna Thompson feels okay, she has no GI/liver complaints.  She is compliant with diuretics.  She has remained abstinent from alcohol.  GERD symptoms controlled with Pepcid 20 mg twice daily.  She has chronic constipation which she manages with linaclotide.  No blood in stools or black stool . echocardiogram in October showed a reduction in cardiac function with  a EF down to 30 to 35%.  For further evaluation of this change patient underwent a right and left heart cath late January.  EF appeared to be around 45%.    GI History / Pertinent GI Studies   **All endoscopic studies may not be included here    EGD 06/04/20: - Nodule found in the esophagus. Biopsied. - Gastritis. - Normal duodenal bulb, first portion of the duodenum and second portion of the duodenum. Path: A. GASTROESOPHAGEAL JUNCTION NODULE, BIOPSY:  - Unremarkable gastric cardia mucosa.  - No intestinal metaplasia, dysplasia or carcinoma.    Colonoscopy 08/10/16: - Redundant LEFT colon. - The examination was otherwise normal. - External and internal hemorrhoids      Latest Ref Rng & Units 08/20/2022   11:21 AM 07/30/2021   11:23 AM 01/14/2021   11:55 AM  Hepatic Function  Total Protein 6.0 - 8.3 g/dL 7.9  8.2  7.1   Albumin 3.5 - 5.2 g/dL 4.6  4.8  4.6   AST 0 - 37 U/L 25  26  30    ALT 0 - 35 U/L 22  25  24    Alk Phosphatase 39 - 117 U/L 97  108  90   Total Bilirubin 0.2 - 1.2 mg/dL 0.5  0.6  0.4        Latest Ref Rng & Units 04/28/2023    2:31 PM 04/22/2023   10:07 AM 04/15/2023  4:00 PM  CBC  WBC 4.0 - 10.5 K/uL 6.1   5.7   Hemoglobin 12.0 - 15.0 g/dL 95.6  21.3    08.6  57.8   Hematocrit 36.0 - 46.0 % 41.2  40.0    39.0  39.3   Platelets 150 - 400 K/uL 287   274      Past Medical History:  Diagnosis Date   Abdominal hernia    repaired   Abscess of pulp of tooth 12/12/2014   left side   Anemia    Anxiety    Arthritis    back  and down flank  right side more than left   B12 deficiency 06/12/2015   Brain aneurysm    METAL COIL PREVENTS MRI IMAGING and CLIP   Cirrhosis (HCC)    Presumably alcoholic cirrhosis   Complication of anesthesia    Depression    ETOH abuse    Quit in 07/2011    Fatigue    due to Sjogren's disease   Flat foot (pes planus) (acquired), left foot    Folate deficiency 06/30/2016   GERD (gastroesophageal reflux disease)    HTN  (hypertension)    no longer medicated, dated on 04/25/12   IBS (irritable bowel syndrome)    Iron deficiency anemia due to chronic blood loss    Myofascial pain    Panic attack    PONV (postoperative nausea and vomiting)    Psoriatic arthritis (HCC)    Right inguinal hernia 08/16/2012   Seizures (HCC) 1997   1 seizure from coil insertion but no more seizures and no meds for seizures   Sjogren's disease (HCC)    soft Bipolar disorder (HCC)    Stroke (HCC) 2019   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   Qualifier: Diagnosis of  By: Romeo Apple MD, Duffy Rhody     TIA (transient ischemic attack)    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(HCC)    Vertigo     Past Surgical History:  Procedure Laterality Date   AGILE CAPSULE N/A 06/21/2014   Procedure: AGILE CAPSULE;  Surgeon: West Bali, MD;  [facility];  Service: Endoscopy;  Laterality: N/A;  0700   BIOPSY  10/13/2011   SLF: mild gastritis/duodenal polypoid lesion    BIOPSY  06/04/2020   Procedure: BIOPSY;  Surgeon: Lanelle Bal, DO;  [facility]: [facility];  Service: Endoscopy;;   CEREBRAL ANEURYSM REPAIR  1997   CHOLECYSTECTOMY N/A 10/28/2018   Procedure: LAPAROSCOPIC CHOLECYSTECTOMY WITH INTRAOPERATIVE CHOLANGIOGRAM;  Surgeon: Lucretia Roers, MD;  Location: [facility];  Service: General;  Laterality: N/A;   COLONOSCOPY  10/13/2011   ION:[id number] hemorrhoids/varices rectal/lesion in ascending colon(HYPERPLASTIC POLYP)   COLONOSCOPY N/A 01/17/2013   SLF: 1. Internal hemorrhoids 2. Ascending colon polyps 3.  Rectal varices.    COLONOSCOPY N/A 08/10/2016   Dr. Darrick Penna: redundant left colon. external and internal hemorhroids. 5-10 year surveillance   ESOPHAGOGASTRODUODENOSCOPY N/A 06/29/2014   SLF: 1. anemia due to polypoid lesions in teh antrum, and duodenal polyps 2. single duodenum AVM   ESOPHAGOGASTRODUODENOSCOPY (EGD) WITH PROPOFOL N/A 03/07/2019   Procedure: ESOPHAGOGASTRODUODENOSCOPY (EGD) WITH PROPOFOL;  Surgeon:  West Bali, MD;  [facility];  Service: Endoscopy;  Laterality: N/A;  1:00pm   ESOPHAGOGASTRODUODENOSCOPY (EGD) WITH PROPOFOL N/A 03/16/2019   Procedure: ESOPHAGOGASTRODUODENOSCOPY (EGD) WITH PROPOFOL;  Surgeon: West Bali, MD;  [facility];  Service: Endoscopy;  Laterality: N/A;   ESOPHAGOGASTRODUODENOSCOPY (EGD) WITH PROPOFOL N/A 06/04/2020   Procedure: ESOPHAGOGASTRODUODENOSCOPY (  EGD) WITH PROPOFOL;  Surgeon: Lanelle Bal, DO;  [facility];  Service: Endoscopy;  Laterality: N/A;  am   FLEXIBLE SIGMOIDOSCOPY N/A 01/21/2016   Procedure: FLEXIBLE SIGMOIDOSCOPY;  Surgeon: West Bali, MD;  [facility];  Service: Endoscopy;  Laterality: N/A;  245   GIVENS CAPSULE STUDY N/A 06/29/2014   Procedure: GIVENS CAPSULE STUDY;  Surgeon: West Bali, MD;  [facility];  Service: Endoscopy;  Laterality: N/A;   GIVENS CAPSULE STUDY N/A 07/13/2014   Procedure: GIVENS CAPSULE STUDY;  Surgeon: West Bali, MD;  [facility];  Service: Endoscopy;  Laterality: N/A;  700   HEMORRHOID BANDING N/A 01/21/2016   Procedure: HEMORRHOID BANDING;  Surgeon: West Bali, MD;  [facility];  Service: Endoscopy;  Laterality: N/A;   HEMOSTASIS CLIP PLACEMENT  03/16/2019   Procedure: HEMOSTASIS CLIP PLACEMENT;  Surgeon: West Bali, MD;  [facility];  Service: Endoscopy;;  x 4   HEMOSTASIS CONTROL  03/16/2019   Procedure: HEMOSTASIS CONTROL;  Surgeon: West Bali, MD;  [facility];  Service: Endoscopy;;  injection with epinephine 0.1mg /ml via scope and bipolar cautery with gold probe   INGUINAL HERNIA REPAIR Right 08/28/2013   Procedure: RIGHT NGUINAL HERNIORRHAPHY WITH MESH;  Surgeon: Dalia Heading, MD;  Location: [facility];  Service: General;  Laterality: Right;   INSERTION OF MESH N/A 06/09/2013   Procedure: INSERTION OF MESH;  Surgeon: Dalia Heading, MD;  Location: [facility];  Service: General;   Laterality: N/A;   INSERTION OF MESH Right 08/28/2013   Procedure: INSERTION OF MESH;  Surgeon: Dalia Heading, MD;  Location: [facility];  Service: General;  Laterality: Right;   IR RADIOLOGIST EVAL & MGMT  02/17/2023   PARACENTESIS  Feb 2014   10.7 liters   RIGHT/LEFT HEART CATH AND CORONARY ANGIOGRAPHY N/A 04/22/2023   Procedure: RIGHT/LEFT HEART CATH AND CORONARY ANGIOGRAPHY;  Surgeon: Yates Decamp, MD;  [facility];  Service: Cardiovascular;  Laterality: N/A;   TOOTH EXTRACTION Left 12/12/14   UMBILICAL HERNIA REPAIR N/A 06/09/2013   Procedure: UMBILICAL HERNIORRHAPHY WITH MESH;  Surgeon: Dalia Heading, MD;  Location: [facility];  Service: General;  Laterality: N/A;    Family History  Problem Relation Age of Onset   Cirrhosis Mother        nash   COPD Father    Heart disease Father    Asthma Other    Heart disease Other    Colon cancer Neg Hx    Inflammatory bowel disease Neg Hx    Stomach cancer Neg Hx    Esophageal cancer Neg Hx     Current Medications, Allergies, Family History and Social History were reviewed in [employer] medical record.     Current Outpatient Medications  Medication Sig Dispense Refill   acetaminophen (TYLENOL) 500 MG tablet Take 500 mg by mouth every 6 (six) hours as needed for moderate pain (pain score 4-6).     atorvastatin (LIPITOR) 40 MG tablet TAKE ONE TABLET BY MOUTH ONCE DAILY. 90 tablet 1   bumetanide (BUMEX) 1 MG tablet TAKE (1) TABLET BY MOUTH TWICE DAILY. 180 tablet 1   buPROPion (WELLBUTRIN XL) 300 MG 24 hr tablet Take 300 mg by mouth daily.     Camphor-Menthol-Methyl Sal (SALONPAS) 3.04-04-08 % PTCH Place 1 patch onto the skin daily as needed (pain.).     cholecalciferol (VITAMIN D3) 25  MCG (1000 UT) tablet Take 2 tablets (2,000 Units total) by mouth daily. 30 tablet 11   DULoxetine (CYMBALTA) 60 MG capsule Take 60 mg by mouth daily.     famotidine (PEPCID) 20 MG tablet Take 1 tablet (20 mg total) by mouth 2 (two) times  daily before a meal. 60 tablet 5   fluticasone (FLONASE) 50 MCG/ACT nasal spray Place 1 spray into both nostrils See admin instructions. Instill 1 spray into each nostril every morning, may also use later in the day as needed for sinus congestion     lamoTRIgine (LAMICTAL) 200 MG tablet Take 400 mg by mouth daily.     linaclotide (LINZESS) 145 MCG CAPS capsule Take 1 capsule (145 mcg total) by mouth daily. Take 30 minutes before a meal 90 capsule 1   loratadine (CLARITIN) 10 MG tablet Take 10 mg by mouth daily as needed for allergies.     Polyvinyl Alcohol-Povidone (REFRESH OP) Place 1 drop into both eyes 3 (three) times daily as needed (dry eyes).     Simethicone (GAS-X PO) Take 1 tablet by mouth 2 (two) times daily as needed (bloating/flatulence).     sodium chloride (OCEAN) 0.65 % nasal spray Place 1 spray into the nose 2 (two) times daily as needed for congestion.     spironolactone (ALDACTONE) 100 MG tablet TAKE 1/2 TABLET BY MOUTH TWICE DAILY. 30 tablet 4   Tofacitinib Citrate ER (XELJANZ XR) 11 MG TB24 Take 1 tablet by mouth daily.     XIIDRA 5 % SOLN Apply 1 drop to eye 2 (two) times daily.     No current facility-administered medications for this visit.    Review of Systems: No chest pain. No shortness of breath. No urinary complaints.    Physical Exam  Filed Weights   05/13/23 1039  Weight: 230 lb (104.3 kg)   Wt Readings from Last 3 Encounters:  05/13/23 230 lb (104.3 kg)  04/22/23 228 lb (103.4 kg)  04/16/23 232 lb (105.2 kg)    BP 118/62   Pulse 72   Ht 5\' 8"  (1.727 m)   Wt 230 lb (104.3 kg)   BMI 34.97 kg/m  Constitutional:  Pleasant, generally well appearing female in no acute distress. Psychiatric: Normal mood and affect. Behavior is normal. EENT: Pupils normal.  Conjunctivae are normal. No scleral icterus. Neck supple.  Cardiovascular: Normal rate, regular rhythm.  Pulmonary/chest: Effort normal and breath sounds normal. No wheezing, rales or  rhonchi. Abdominal: Soft, nondistended, nontender. Bowel sounds active throughout. There are no masses palpable. No hepatomegaly. Neurological: Alert and oriented to person place and time.    Willette Cluster, NP  05/13/2023, 10:58 AM  Cc:  Tommie Sams, DO

## 2023-05-13 NOTE — Patient Instructions (Signed)
You have been scheduled for an endoscopy. Please follow written instructions given to you at your visit today.  If you use inhalers (even only as needed), please bring them with you on the day of your procedure.  If you take any of the following medications, they will need to be adjusted prior to your procedure:   DO NOT TAKE 7 DAYS PRIOR TO TEST- Trulicity (dulaglutide) Ozempic, Wegovy (semaglutide) Mounjaro (tirzepatide) Bydureon Bcise (exanatide extended release)  DO NOT TAKE 1 DAY PRIOR TO YOUR TEST Rybelsus (semaglutide) Adlyxin (lixisenatide) Victoza (liraglutide) Byetta (exanatide) ___________________________________________________________________________   Your provider has requested that you go to the basement level for lab work before leaving today. Press "B" on the elevator. The lab is located at the first door on the left as you exit the elevator.  Due to recent changes in healthcare laws, you may see the results of your imaging and laboratory studies on MyChart before your provider has had a chance to review them.  We understand that in some cases there may be results that are confusing or concerning to you. Not all laboratory results come back in the same time frame and the provider may be waiting for multiple results in order to interpret others.  Please give [place] 48 hours in order for your provider to thoroughly review all the results before contacting the office for clarification of your results.   I appreciate the opportunity to care for you. Willette Cluster, NP

## 2023-05-14 ENCOUNTER — Other Ambulatory Visit: Payer: Self-pay | Admitting: *Deleted

## 2023-05-14 DIAGNOSIS — R7989 Other specified abnormal findings of blood chemistry: Secondary | ICD-10-CM

## 2023-05-17 LAB — AFP TUMOR MARKER: AFP-Tumor Marker: 5.8 ng/mL

## 2023-05-19 ENCOUNTER — Inpatient Hospital Stay: Payer: Medicare HMO

## 2023-05-21 ENCOUNTER — Inpatient Hospital Stay: Payer: Medicare HMO

## 2023-05-21 VITALS — BP 129/76 | HR 67 | Temp 98.4°F | Resp 18

## 2023-05-21 DIAGNOSIS — D500 Iron deficiency anemia secondary to blood loss (chronic): Secondary | ICD-10-CM | POA: Diagnosis not present

## 2023-05-21 MED ORDER — SODIUM CHLORIDE 0.9 % IV SOLN
INTRAVENOUS | Status: DC
Start: 2023-05-21 — End: 2023-05-21

## 2023-05-21 MED ORDER — SODIUM CHLORIDE 0.9 % IV SOLN
400.0000 mg | Freq: Once | INTRAVENOUS | Status: AC
  Administered 2023-05-21: 400 mg via INTRAVENOUS
  Filled 2023-05-21: qty 400

## 2023-05-21 NOTE — Patient Instructions (Signed)
 [facility] [place] - A DEPT OF [facility] [place]  Discharge Instructions: Thank you for choosing [facility] to provide your oncology and hematology care.  If you have a lab appointment with the Cancer Center - please note that after April 8th, 2024, all labs will be drawn in the cancer center.  You do not have to check in or register with the main entrance as you have in the past but will complete your check-in in the cancer center.  Wear comfortable clothing and clothing appropriate for easy access to any Portacath or PICC line.   We strive to give you quality time with your provider. You may need to reschedule your appointment if you arrive late (15 or more minutes).  Arriving late affects you and other patients whose appointments are after yours.  Also, if you miss three or more appointments without notifying the office, you may be dismissed from the clinic at the provider's discretion.      For prescription refill requests, have your pharmacy contact our office and allow 72 hours for refills to be completed.    Today you received Venofer IV iron infusion.     BELOW ARE SYMPTOMS THAT SHOULD BE REPORTED IMMEDIATELY: *FEVER GREATER THAN 100.4 F (38 C) OR HIGHER *CHILLS OR SWEATING *NAUSEA AND VOMITING THAT IS NOT CONTROLLED WITH YOUR NAUSEA MEDICATION *UNUSUAL SHORTNESS OF BREATH *UNUSUAL BRUISING OR BLEEDING *URINARY PROBLEMS (pain or burning when urinating, or frequent urination) *BOWEL PROBLEMS (unusual diarrhea, constipation, pain near the anus) TENDERNESS IN MOUTH AND THROAT WITH OR WITHOUT PRESENCE OF ULCERS (sore throat, sores in mouth, or a toothache) UNUSUAL RASH, SWELLING OR PAIN  UNUSUAL VAGINAL DISCHARGE OR ITCHING   Items with * indicate a potential emergency and should be followed up as soon as possible or go to the Emergency Department if any problems should occur.  Please show the CHEMOTHERAPY ALERT CARD or IMMUNOTHERAPY ALERT CARD at  check-in to the Emergency Department and triage nurse.  Should you have questions after your visit or need to cancel or reschedule your appointment, please contact [facility]  - A DEPT OF [facility] [phone]  and follow the prompts.  Office hours are 8:00 a.m. to 4:30 p.m. Monday - Friday. Please note that voicemails left after 4:00 p.m. may not be returned until the following business day.  We are closed weekends and major holidays. You have access to a nurse at all times for urgent questions. Please call the main number to the clinic [phone] and follow the prompts.  For any non-urgent questions, you may also contact your provider using MyChart. We now offer e-Visits for anyone 13 and older to request care online for non-urgent symptoms. For details visit [email or web address].   Also download the MyChart app! Go to the app store, search "MyChart", open the app, select [place], and log in with your MyChart username and password.

## 2023-05-21 NOTE — Progress Notes (Signed)
Patient took her pre-medications this am

## 2023-05-21 NOTE — Progress Notes (Signed)
 Patient presents today for iron infusion.  Patient is in satisfactory condition with no new complaints voiced.  Vital signs are stable.  We will proceed with infusion per provider orders.    Peripheral IV started with good blood return pre and post infusion.  Venofer 400 mg given today per MD orders. Tolerated infusion without adverse affects. Vital signs stable. No complaints at this time. Discharged from clinic ambulatory in stable condition. Alert and oriented x 3. F/U with [facility] as scheduled.

## 2023-06-06 ENCOUNTER — Other Ambulatory Visit: Payer: Self-pay | Admitting: Family Medicine

## 2023-06-06 DIAGNOSIS — Z8673 Personal history of transient ischemic attack (TIA), and cerebral infarction without residual deficits: Secondary | ICD-10-CM

## 2023-06-07 ENCOUNTER — Encounter: Payer: Self-pay | Admitting: Internal Medicine

## 2023-06-19 ENCOUNTER — Other Ambulatory Visit: Payer: Self-pay | Admitting: Internal Medicine

## 2023-06-20 ENCOUNTER — Encounter: Payer: Self-pay | Admitting: Certified Registered Nurse Anesthetist

## 2023-06-23 ENCOUNTER — Ambulatory Visit: Payer: Medicare HMO | Admitting: Internal Medicine

## 2023-06-23 ENCOUNTER — Encounter: Payer: Self-pay | Admitting: Internal Medicine

## 2023-06-23 VITALS — BP 130/66 | HR 66 | Temp 97.9°F | Resp 10 | Ht 68.0 in | Wt 230.0 lb

## 2023-06-23 DIAGNOSIS — K2980 Duodenitis without bleeding: Secondary | ICD-10-CM

## 2023-06-23 DIAGNOSIS — K208 Other esophagitis without bleeding: Secondary | ICD-10-CM | POA: Diagnosis not present

## 2023-06-23 DIAGNOSIS — K2289 Other specified disease of esophagus: Secondary | ICD-10-CM

## 2023-06-23 DIAGNOSIS — F1091 Alcohol use, unspecified, in remission: Secondary | ICD-10-CM

## 2023-06-23 DIAGNOSIS — K317 Polyp of stomach and duodenum: Secondary | ICD-10-CM | POA: Diagnosis not present

## 2023-06-23 DIAGNOSIS — K31819 Angiodysplasia of stomach and duodenum without bleeding: Secondary | ICD-10-CM

## 2023-06-23 DIAGNOSIS — K209 Esophagitis, unspecified without bleeding: Secondary | ICD-10-CM

## 2023-06-23 DIAGNOSIS — K7030 Alcoholic cirrhosis of liver without ascites: Secondary | ICD-10-CM

## 2023-06-23 MED ORDER — SODIUM CHLORIDE 0.9 % IV SOLN: 500.0000 mL | Freq: Once | INTRAVENOUS | Status: DC

## 2023-06-23 NOTE — Progress Notes (Signed)
 GASTROENTEROLOGY PROCEDURE H&P NOTE   Primary Care Physician: Tommie Sams, DO    Reason for Procedure:   EtOH cirrhosis, variceal screening  Plan:    EGD  Patient is appropriate for endoscopic procedure(s) in the ambulatory ([facility]) setting.  The nature of the procedure, as well as the risks, benefits, and alternatives were carefully and thoroughly reviewed with the patient. Ample time for discussion and questions allowed. The patient understood, was satisfied, and agreed to proceed.     HPI: Deanna Thompson is a 68 y.o. female who presents for EGD for evaluation of EtOH cirrhosis,  variceal screening.  Patient was most recently seen in the Gastroenterology Clinic on 05/13/23.  No interval change in medical history since that appointment. Please refer to that note for full details regarding GI history and clinical presentation.   Past Medical History:  Diagnosis Date   Abdominal hernia    repaired   Abscess of pulp of tooth 12/12/2014   left side   Anemia    Anxiety    Arthritis    back  and down flank  right side more than left   B12 deficiency 06/12/2015   Brain aneurysm    METAL COIL PREVENTS MRI IMAGING and CLIP   Cirrhosis (HCC)    Presumably alcoholic cirrhosis   Complication of anesthesia    Depression    ETOH abuse    Quit in 07/2011    Fatigue    due to Sjogren's disease   Flat foot (pes planus) (acquired), left foot    Folate deficiency 06/30/2016   GERD (gastroesophageal reflux disease)    HTN (hypertension)    no longer medicated, dated on 04/25/12   IBS (irritable bowel syndrome)    Iron deficiency anemia due to chronic blood loss    Myofascial pain    Panic attack    PONV (postoperative nausea and vomiting)    Psoriatic arthritis (HCC)    Right inguinal hernia 08/16/2012   Seizures (HCC) 1997   1 seizure from coil insertion but no more seizures and no meds for seizures   Sjogren's disease (HCC)    soft Bipolar disorder (HCC)    Stroke (HCC)  2019   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   Qualifier: Diagnosis of  By: Romeo Apple MD, Duffy Rhody     TIA (transient ischemic attack)    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(HCC)    Vertigo     Past Surgical History:  Procedure Laterality Date   AGILE CAPSULE N/A 06/21/2014   Procedure: AGILE CAPSULE;  Surgeon: West Bali, MD;  [facility];  Service: Endoscopy;  Laterality: N/A;  0700   BIOPSY  10/13/2011   SLF: mild gastritis/duodenal polypoid lesion    BIOPSY  06/04/2020   Procedure: BIOPSY;  Surgeon: Lanelle Bal, DO;  [facility];  Service: Endoscopy;;   CEREBRAL ANEURYSM REPAIR  1997   CHOLECYSTECTOMY N/A 10/28/2018   Procedure: LAPAROSCOPIC CHOLECYSTECTOMY WITH INTRAOPERATIVE CHOLANGIOGRAM;  Surgeon: Lucretia Roers, MD;  Location: [facility];  Service: General;  Laterality: N/A;   COLONOSCOPY  10/13/2011   [id number] hemorrhoids/varices rectal/lesion in ascending colon(HYPERPLASTIC POLYP)   COLONOSCOPY N/A 01/17/2013   SLF: 1. Internal hemorrhoids 2. Ascending colon polyps 3.  Rectal varices.    COLONOSCOPY N/A 08/10/2016   Dr. Darrick Penna: redundant left colon. external and internal hemorhroids. 5-10 year surveillance   ESOPHAGOGASTRODUODENOSCOPY N/A 06/29/2014   SLF: 1. anemia due to polypoid lesions in teh antrum, and duodenal polyps 2.  single duodenum AVM   ESOPHAGOGASTRODUODENOSCOPY (EGD) WITH PROPOFOL N/A 03/07/2019   Procedure: ESOPHAGOGASTRODUODENOSCOPY (EGD) WITH PROPOFOL;  Surgeon: West Bali, MD;  [facility];  Service: Endoscopy;  Laterality: N/A;  1:00pm   ESOPHAGOGASTRODUODENOSCOPY (EGD) WITH PROPOFOL N/A 03/16/2019   Procedure: ESOPHAGOGASTRODUODENOSCOPY (EGD) WITH PROPOFOL;  Surgeon: West Bali, MD;  [facility];  Service: Endoscopy;  Laterality: N/A;   ESOPHAGOGASTRODUODENOSCOPY (EGD) WITH PROPOFOL N/A 06/04/2020   Procedure: ESOPHAGOGASTRODUODENOSCOPY (EGD) WITH PROPOFOL;  Surgeon: Lanelle Bal, DO;   [facility];  Service: Endoscopy;  Laterality: N/A;  am   FLEXIBLE SIGMOIDOSCOPY N/A 01/21/2016   Procedure: FLEXIBLE SIGMOIDOSCOPY;  Surgeon: West Bali, MD;  [facility];  Service: Endoscopy;  Laterality: N/A;  245   GIVENS CAPSULE STUDY N/A 06/29/2014   Procedure: GIVENS CAPSULE STUDY;  Surgeon: West Bali, MD;  [facility];  Service: Endoscopy;  Laterality: N/A;   GIVENS CAPSULE STUDY N/A 07/13/2014   Procedure: GIVENS CAPSULE STUDY;  Surgeon: West Bali, MD;  [facility];  Service: Endoscopy;  Laterality: N/A;  700   HEMORRHOID BANDING N/A 01/21/2016   Procedure: HEMORRHOID BANDING;  Surgeon: West Bali, MD;  [facility];  Service: Endoscopy;  Laterality: N/A;   HEMOSTASIS CLIP PLACEMENT  03/16/2019   Procedure: HEMOSTASIS CLIP PLACEMENT;  Surgeon: West Bali, MD;  [facility];  Service: Endoscopy;;  x 4   HEMOSTASIS CONTROL  03/16/2019   Procedure: HEMOSTASIS CONTROL;  Surgeon: West Bali, MD;  [facility];  Service: Endoscopy;;  injection with epinephine 0.1mg /ml via scope and bipolar cautery with gold probe   INGUINAL HERNIA REPAIR Right 08/28/2013   Procedure: RIGHT NGUINAL HERNIORRHAPHY WITH MESH;  Surgeon: Dalia Heading, MD;  Location: [facility];  Service: General;  Laterality: Right;   INSERTION OF MESH N/A 06/09/2013   Procedure: INSERTION OF MESH;  Surgeon: Dalia Heading, MD;  Location: [facility];  Service: General;  Laterality: N/A;   INSERTION OF MESH Right 08/28/2013   Procedure: INSERTION OF MESH;  Surgeon: Dalia Heading, MD;  Location: [facility];  Service: General;  Laterality: Right;   IR RADIOLOGIST EVAL & MGMT  02/17/2023   PARACENTESIS  Feb 2014   10.7 liters   RIGHT/LEFT HEART CATH AND CORONARY ANGIOGRAPHY N/A 04/22/2023   Procedure: RIGHT/LEFT HEART CATH AND CORONARY ANGIOGRAPHY;  Surgeon: Yates Decamp, MD;  [facility];  Service: Cardiovascular;  Laterality:  N/A;   TOOTH EXTRACTION Left 12/12/14   UMBILICAL HERNIA REPAIR N/A 06/09/2013   Procedure: UMBILICAL HERNIORRHAPHY WITH MESH;  Surgeon: Dalia Heading, MD;  Location: [facility];  Service: General;  Laterality: N/A;    Prior to Admission medications   Medication Sig Start Date End Date Taking? Authorizing Provider  acetaminophen (TYLENOL) 500 MG tablet Take 500 mg by mouth every 6 (six) hours as needed for moderate pain (pain score 4-6).    [provider]  atorvastatin (LIPITOR) 40 MG tablet TAKE ONE TABLET BY MOUTH ONCE DAILY. 06/09/23   Cook, Verdis Frederickson, DO  bumetanide (BUMEX) 1 MG tablet TAKE (1) TABLET BY MOUTH TWICE DAILY. 04/16/23   Tommie Sams, DO  buPROPion (WELLBUTRIN XL) 300 MG 24 hr tablet Take 300 mg by mouth daily. 04/25/22   [provider]  Camphor-Menthol-Methyl Sal (SALONPAS) 3.04-04-08 % PTCH Place 1 patch onto the skin daily as needed (pain.).  [provider]  chlorhexidine (PERIDEX) 0.12 % solution  05/18/23   [provider]  cholecalciferol (VITAMIN D3) 25 MCG (1000 UT) tablet Take 2 tablets (2,000 Units total) by mouth daily. 12/14/18   Fields, Darleene Cleaver, MD  DULoxetine (CYMBALTA) 60 MG capsule Take 60 mg by mouth daily.    [provider]  famotidine (PEPCID) 20 MG tablet Take 1 tablet (20 mg total) by mouth 2 (two) times daily before a meal. 10/17/19   Clearance Coots, Gwendalyn Ege, PA-C  fluticasone (FLONASE) 50 MCG/ACT nasal spray Place 1 spray into both nostrils See admin instructions. Instill 1 spray into each nostril every morning, may also use later in the day as needed for sinus congestion    [provider]  HYDROcodone-acetaminophen (NORCO/VICODIN) 5-325 MG tablet Take 1 tablet by mouth every 6 (six) hours as needed. 05/18/23   [provider]  lamoTRIgine (LAMICTAL) 200 MG tablet Take 400 mg by mouth daily. 02/06/21   [provider]  LINZESS 145 MCG CAPS capsule TAKE (1) CAPSULE BY MOUTH EVERY DAY BEFORE BREAKFAST.  06/20/23   Imogene Burn, MD  loratadine (CLARITIN) 10 MG tablet Take 10 mg by mouth daily as needed for allergies.    [provider]  Polyvinyl Alcohol-Povidone (REFRESH OP) Place 1 drop into both eyes 3 (three) times daily as needed (dry eyes).    [provider]  Simethicone (GAS-X PO) Take 1 tablet by mouth 2 (two) times daily as needed (bloating/flatulence).    [provider]  sodium chloride (OCEAN) 0.65 % nasal spray Place 1 spray into the nose 2 (two) times daily as needed for congestion.    [provider]  spironolactone (ALDACTONE) 100 MG tablet TAKE 1/2 TABLET BY MOUTH TWICE DAILY. 01/06/23   Tommie Sams, DO  Tofacitinib Citrate ER (XELJANZ XR) 11 MG TB24 Take 1 tablet by mouth daily.    [provider]  XIIDRA 5 % SOLN Apply 1 drop to eye 2 (two) times daily. 09/08/21   [provider]    Current Outpatient Medications  Medication Sig Dispense Refill   acetaminophen (TYLENOL) 500 MG tablet Take 500 mg by mouth every 6 (six) hours as needed for moderate pain (pain score 4-6).     atorvastatin (LIPITOR) 40 MG tablet TAKE ONE TABLET BY MOUTH ONCE DAILY. 90 tablet 1   bumetanide (BUMEX) 1 MG tablet TAKE (1) TABLET BY MOUTH TWICE DAILY. 180 tablet 1   buPROPion (WELLBUTRIN XL) 300 MG 24 hr tablet Take 300 mg by mouth daily.     Camphor-Menthol-Methyl Sal (SALONPAS) 3.04-04-08 % PTCH Place 1 patch onto the skin daily as needed (pain.).     chlorhexidine (PERIDEX) 0.12 % solution      cholecalciferol (VITAMIN D3) 25 MCG (1000 UT) tablet Take 2 tablets (2,000 Units total) by mouth daily. 30 tablet 11   DULoxetine (CYMBALTA) 60 MG capsule Take 60 mg by mouth daily.     famotidine (PEPCID) 20 MG tablet Take 1 tablet (20 mg total) by mouth 2 (two) times daily before a meal. 60 tablet 5   fluticasone (FLONASE) 50 MCG/ACT nasal spray Place 1 spray into both nostrils See admin instructions. Instill 1 spray into each nostril every morning, may  also use later in the day as needed for sinus congestion     HYDROcodone-acetaminophen (NORCO/VICODIN) 5-325 MG tablet Take 1 tablet by mouth every 6 (six) hours as needed.     lamoTRIgine (LAMICTAL) 200 MG tablet Take  400 mg by mouth daily.     LINZESS 145 MCG CAPS capsule TAKE (1) CAPSULE BY MOUTH EVERY DAY BEFORE BREAKFAST. 90 capsule 1   loratadine (CLARITIN) 10 MG tablet Take 10 mg by mouth daily as needed for allergies.     Polyvinyl Alcohol-Povidone (REFRESH OP) Place 1 drop into both eyes 3 (three) times daily as needed (dry eyes).     Simethicone (GAS-X PO) Take 1 tablet by mouth 2 (two) times daily as needed (bloating/flatulence).     sodium chloride (OCEAN) 0.65 % nasal spray Place 1 spray into the nose 2 (two) times daily as needed for congestion.     spironolactone (ALDACTONE) 100 MG tablet TAKE 1/2 TABLET BY MOUTH TWICE DAILY. 30 tablet 4   Tofacitinib Citrate ER (XELJANZ XR) 11 MG TB24 Take 1 tablet by mouth daily.     XIIDRA 5 % SOLN Apply 1 drop to eye 2 (two) times daily.     Current Facility-Administered Medications  Medication Dose Route Frequency Provider Last Rate Last Admin   0.9 %  sodium chloride infusion  500 mL Intravenous Once Imogene Burn, MD        Allergies as of 06/23/2023 - Review Complete 06/20/2023  Allergen Reaction Noted   Omeprazole Shortness Of Breath 08/19/2011   Orencia [abatacept] Anaphylaxis, Hives, and Rash 10/25/2017   Other Shortness Of Breath, Rash, and Other (See Comments) 01/09/2013   Phenytoin Rash 06/30/2021   Keflex [cephalexin] Other (See Comments)    Lidocaine viscous hcl  03/16/2019   Promethazine hcl Other (See Comments) 04/08/2012    Family History  Problem Relation Age of Onset   Cirrhosis Mother        nash   COPD Father    Heart disease Father    Asthma Other    Heart disease Other    Colon cancer Neg Hx    Inflammatory bowel disease Neg Hx    Stomach cancer Neg Hx    Esophageal cancer Neg Hx     Social History    Socioeconomic History   Marital status: Married    Spouse name: Transport planner   Number of children: 0   Years of education: Not on file   Highest education level: Master's degree (e.g., MA, MS, MEng, MEd, MSW, MBA)  Occupational History    Employer: RETIRED  Tobacco Use   Smoking status: Former    Current packs/day: 0.00    Average packs/day: 2.0 packs/day for 12.0 years (24.0 ttl pk-yrs)    Types: Cigarettes    Start date: 03/31/1983    Quit date: 03/31/1995    Years since quitting: 28.2   Smokeless tobacco: Never   Tobacco comments:    quit in 1997  Vaping Use   Vaping status: Never Used  Substance and Sexual Activity   Alcohol use: No    Comment: Bottle of wine a day for years, but quit in 07/2011   Drug use: No   Sexual activity: Not Currently    Birth control/protection: Post-menopausal  Other Topics Concern   Not on file  Social History Narrative   Lives   Caffeine use:    Quit drinking alcohol 3 YEARS ago. No drugs, no IVDU.    Social Drivers of Corporate investment banker Strain: Low Risk  (04/15/2023)   Overall Financial Resource Strain (CARDIA)    Difficulty of Paying Living Expenses: Not hard at all  Food Insecurity: No Food Insecurity (04/15/2023)   Hunger Vital Sign  Worried About Programme researcher, broadcasting/film/video in the Last Year: Never true    Ran Out of Food in the Last Year: Never true  Transportation Needs: No Transportation Needs (07/21/2022)   PRAPARE - Administrator, Civil Service (Medical): No    Lack of Transportation (Non-Medical): No  Physical Activity: Insufficiently Active (04/15/2023)   Exercise Vital Sign    Days of Exercise per Week: 3 days    Minutes of Exercise per Session: 20 min  Stress: Stress Concern Present (04/15/2023)   [employer] of Occupational Health - Occupational Stress Questionnaire    Feeling of Stress : Very much  Social Connections: Moderately Isolated (04/15/2023)   Social Connection and Isolation Panel [NHANES]     Frequency of Communication with Friends and Family: More than three times a week    Frequency of Social Gatherings with Friends and Family: Twice a week    Attends Religious Services: Never    Database administrator or Organizations: No    Attends Banker Meetings: Never    Marital Status: Married  Catering manager Violence: Not At Risk (07/21/2022)   Humiliation, Afraid, Rape, and Kick questionnaire    Fear of Current or Ex-Partner: No    Emotionally Abused: No    Physically Abused: No    Sexually Abused: No    Physical Exam: Vital signs in last 24 hours: BP 125/71   Pulse 78   Temp 97.9 F (36.6 C) (Temporal)   Ht 5\' 8"  (1.727 m)   Wt 230 lb (104.3 kg)   SpO2 98%   BMI 34.97 kg/m  GEN: NAD EYE: Sclerae anicteric ENT: MMM CV: Non-tachycardic Pulm: No increased WOB GI: Soft NEURO:  Alert & Oriented   Eulah Pont, MD [facility]   06/23/2023 9:44 AM

## 2023-06-23 NOTE — Progress Notes (Signed)
 Report given to PACU, vss

## 2023-06-23 NOTE — Op Note (Signed)
 [facility] Patient Name: [place] Procedure Date: 06/23/2023 11:26 AM MRN: [id number] Endoscopist: Particia Lather , , [id number] Age: 68 Referring MD:  Date of Birth: 1955-12-23 Gender: Female Account #: [id number] Procedure:                Upper GI endoscopy Indications:              Cirrhosis rule out esophageal varices Medicines:                Monitored Anesthesia Care Procedure:                Pre-Anesthesia Assessment:                           - Prior to the procedure, a History and Physical                            was performed, and patient medications and                            allergies were reviewed. The patient's tolerance of                            previous anesthesia was also reviewed. The risks                            and benefits of the procedure and the sedation                            options and risks were discussed with the patient.                            All questions were answered, and informed consent                            was obtained. Prior Anticoagulants: The patient has                            taken no anticoagulant or antiplatelet agents. ASA                            Grade Assessment: III - A patient with severe                            systemic disease. After reviewing the risks and                            benefits, the patient was deemed in satisfactory                            condition to undergo the procedure.                           After obtaining informed consent, the endoscope was  passed under direct vision. Throughout the                            procedure, the patient's blood pressure, pulse, and                            oxygen saturations were monitored continuously. The                            Olympus Scope SN [id number] was introduced through the                            mouth, and advanced to the second part of duodenum.                             The upper GI endoscopy was accomplished without                            difficulty. The patient tolerated the procedure                            well. Scope In: Scope Out: Findings:                 Salmon-colored mucosa was present. The maximum                            longitudinal extent of these esophageal mucosal                            changes was 1 cm in length. Mucosa was biopsied                            with a cold forceps for histology. One specimen                            bottle was sent to pathology.                           A single medium angioectasia with no bleeding was                            found in the gastric body.                           A single 3 mm sessile polyp with no bleeding was                            found in the duodenal bulb. The polyp was removed                            with a cold biopsy forceps. Resection and retrieval  were complete. Complications:            No immediate complications. Estimated Blood Loss:     Estimated blood loss was minimal. Impression:               - Salmon-colored mucosa. Biopsied.                           - A single non-bleeding angioectasia in the stomach.                           - A single duodenal polyp. Resected and retrieved. Recommendation:           - Discharge patient to home (with escort).                           - Await pathology results.                           - RTC in 6 months for follow up of cirrhosis.                           - The findings and recommendations were discussed                            with the patient. Dr Particia Lather "Alan Ripper" Leonides Schanz,  06/23/2023 12:01:04 PM

## 2023-06-23 NOTE — Patient Instructions (Addendum)
-   Discharge patient to home (with escort). - Await pathology results. - RTC in 6 months for follow up of cirrhosis. - The findings and recommendations were discussed with the patient.  YOU HAD AN ENDOSCOPIC PROCEDURE TODAY AT THE [facility]:   Refer to the procedure report that was given to you for any specific questions about what was found during the examination.  If the procedure report does not answer your questions, please call your gastroenterologist to clarify.  If you requested that your care partner not be given the details of your procedure findings, then the procedure report has been included in a sealed envelope for you to review at your convenience later.  YOU SHOULD EXPECT: Some feelings of bloating in the abdomen. Passage of more gas than usual.  Walking can help get rid of the air that was put into your GI tract during the procedure and reduce the bloating. If you had a lower endoscopy (such as a colonoscopy or flexible sigmoidoscopy) you may notice spotting of blood in your stool or on the toilet paper. If you underwent a bowel prep for your procedure, you may not have a normal bowel movement for a few days.  Please Note:  You might notice some irritation and congestion in your nose or some drainage.  This is from the oxygen used during your procedure.  There is no need for concern and it should clear up in a day or so.  SYMPTOMS TO REPORT IMMEDIATELY: Following upper endoscopy (EGD)  Vomiting of blood or coffee ground material  New chest pain or pain under the shoulder blades  Painful or persistently difficult swallowing  New shortness of breath  Fever of 100F or higher  Black, tarry-looking stools  For urgent or emergent issues, a gastroenterologist can be reached at any hour by calling [phone]. Do not use MyChart messaging for urgent concerns.    DIET:  We do recommend a small meal at first, but then you may proceed to your regular diet.  Drink plenty  of fluids but you should avoid alcoholic beverages for 24 hours.  ACTIVITY:  You should plan to take it easy for the rest of today and you should NOT DRIVE or use heavy machinery until tomorrow (because of the sedation medicines used during the test).    FOLLOW UP: Our staff will call the number listed on your records the next business day following your procedure.  We will call around 7:15- 8:00 am to check on you and address any questions or concerns that you may have regarding the information given to you following your procedure. If we do not reach you, we will leave a message.     If any biopsies were taken you will be contacted by phone or by letter within the next 1-3 weeks.  Please call us at [phone] if you have not heard about the biopsies in 3 weeks.    SIGNATURES/CONFIDENTIALITY: You and/or your care partner have signed paperwork which will be entered into your electronic medical record.  These signatures attest to the fact that that the information above on your After Visit Summary has been reviewed and is understood.  Full responsibility of the confidentiality of this discharge information lies with you and/or your care-partner.

## 2023-06-24 ENCOUNTER — Telehealth: Payer: Self-pay

## 2023-06-24 NOTE — Telephone Encounter (Signed)
 LMOM

## 2023-06-25 ENCOUNTER — Encounter: Payer: Self-pay | Admitting: Internal Medicine

## 2023-06-25 LAB — SURGICAL PATHOLOGY

## 2023-06-29 ENCOUNTER — Encounter: Payer: Self-pay | Admitting: Nurse Practitioner

## 2023-06-29 ENCOUNTER — Ambulatory Visit: Payer: Medicare HMO | Attending: Nurse Practitioner | Admitting: Nurse Practitioner

## 2023-06-29 VITALS — BP 120/70 | HR 74 | Ht 68.0 in | Wt 239.0 lb

## 2023-06-29 DIAGNOSIS — I2510 Atherosclerotic heart disease of native coronary artery without angina pectoris: Secondary | ICD-10-CM | POA: Diagnosis not present

## 2023-06-29 DIAGNOSIS — I5020 Unspecified systolic (congestive) heart failure: Secondary | ICD-10-CM | POA: Diagnosis not present

## 2023-06-29 DIAGNOSIS — I6523 Occlusion and stenosis of bilateral carotid arteries: Secondary | ICD-10-CM

## 2023-06-29 DIAGNOSIS — E669 Obesity, unspecified: Secondary | ICD-10-CM

## 2023-06-29 DIAGNOSIS — I428 Other cardiomyopathies: Secondary | ICD-10-CM

## 2023-06-29 DIAGNOSIS — N1831 Chronic kidney disease, stage 3a: Secondary | ICD-10-CM

## 2023-06-29 DIAGNOSIS — I671 Cerebral aneurysm, nonruptured: Secondary | ICD-10-CM

## 2023-06-29 DIAGNOSIS — I5042 Chronic combined systolic (congestive) and diastolic (congestive) heart failure: Secondary | ICD-10-CM

## 2023-06-29 DIAGNOSIS — N1832 Chronic kidney disease, stage 3b: Secondary | ICD-10-CM

## 2023-06-29 MED ORDER — DAPAGLIFLOZIN PROPANEDIOL 10 MG PO TABS: 10.0000 mg | ORAL_TABLET | Freq: Every day | ORAL | 5 refills | Status: DC

## 2023-06-29 NOTE — Patient Instructions (Addendum)
 Medication Instructions:  Your physician has recommended you make the following change in your medication:  Please Start Taking Farxiga 10 Mg daily   Labwork: In 2 weeks at [facility]   Testing/Procedures: None   Follow-Up: Your physician recommends that you schedule a follow-up appointment in: 2-3 months   Any Other Special Instructions Will Be Listed Below (If Applicable).  If you need a refill on your cardiac medications before your next appointment, please call your pharmacy.

## 2023-06-29 NOTE — Progress Notes (Unsigned)
 Cardiology Office Note:  .   Date:  06/29/2023 ID:  [name], DOB 12-Nov-1955, MRN [id number] PCP: Tommie Sams DO  [facility] Providers Cardiologist:  Dina Rich, MD    History of Present Illness: .   Deanna Thompson is a 68 y.o. 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  Last seen by Ronie Spies, PA-C on December 04, 2022 for follow-up.  Was found to be overall doing well from a cardiac perspective.  She noted chronic unchanged fatigue, felt to be multifactorial.  Denying chest pain or dyspnea.  Has started walking again at the time.  Noted chronic history of episodic dizziness related to vertigo.  Updated echo revealed EF worsening at 30 to 35%, was 45 to 50%.  Dr. [place] was consulted about whether to pursue cath for worsening LV function.  Dr. [place] agreed that she would benefit from heart catheterization, right and left.  Recommended for patient to follow-up in office to discuss.  Recommended that patient touch base with Dr. Corliss Skains office regarding her previous brain aneurysm.  Seen by Dr. Corliss Skains on February 16, 2023. He arranged a CT angiogram of head in neck for further evaluation. See results below.   Underwent right and left heart catheterization on April 22, 2023. See results below.   Today she presents for follow-up post cardiac cath. Pt says overall she is doing well. Does admit to some weight gain d/t dietary indiscretions. Denies any chest pain, shortness of breath, palpitations, syncope, presyncope, dizziness, orthopnea, PND, swelling or significant weight changes, acute bleeding, or  claudication.  ROS: Negative. See HPI.  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   Studies Reviewed: Marland Kitchen    EKG: EKG is not ordered today.   Right/left heart cath 03/2023:  Right & Left Heart Catheterization 04/22/23: Hemodynamic data: Normal right heart pressure, QP/T was 1.00.  CO 4.67/CI 2.16 by Fick. Normal EDP at 13 mmHg.  No pressure gradient across the aortic valve.   Angiographic data: LV: Mildly dilated.  Global hypokinesis.  EF appears to be around 45%.  No significant mitral regurgitation.   Normal coronary arteries, codominant circulation. LM is large LAD: Gives origin to large D1, moderate-sized D2 and D3. LCx: Codominant with RCA.  Large OM 2 and OM 4.  PDA is moderate-sized. RI: Large vessel.  Traverses to the apex. RCA: Codominant with CX.  Small to moderate-sized PDA and PL branch.         Impression and recommendations: Findings consistent with nonischemic cardiomyopathy with normal coronary arteries.  Normal right heart catheterization with preserved cardiac output and index.  Carotid duplex 01/2023: Summary:  Right Carotid: Velocities in the right ICA are consistent with a 1-39%  stenosis. Non-hemodynamically significant plaque <50% noted in the  CCA. The ECA appears <50% stenosed.   Left Carotid: Velocities in the left ICA are consistent with a 1-39%  stenosis. Non-hemodynamically significant plaque <50% noted in the  CCA. The ECA appears <50% stenosed.   Vertebrals:  Bilateral vertebral arteries demonstrate antegrade flow.  Subclavians: Normal flow hemodynamics were seen in bilateral subclavian arteries.   Echo 12/2022: 1. Left ventricular ejection fraction, by estimation, is 30 to  35%. The  left ventricle has moderately decreased function. The left ventricle  demonstrates regional wall motion abnormalities (see scoring  diagram/findings for description). Left ventricular   diastolic parameters are consistent with Grade I  diastolic dysfunction  (impaired relaxation).   2. Right ventricular systolic function is normal. The right ventricular  size is normal. There is normal pulmonary artery systolic pressure.   3. The mitral valve is normal in structure. Trivial mitral valve  regurgitation. No evidence of mitral stenosis.   4. The tricuspid valve is abnormal.   5. The aortic valve has an indeterminant number of cusps. There is mild  calcification of the aortic valve. There is mild thickening of the aortic  valve. Aortic valve regurgitation is not visualized. No aortic stenosis is  present.   6. The inferior vena cava is normal in size with greater than 50%  respiratory variability, suggesting right atrial pressure of 3 mmHg.  Lexiscan 11/2017: The left ventricular ejection fraction is mildly decreased (45-54%). Very severe gut radiotracer uptake in the resting and post-injection images, most significant in the post-injection images. Overall perfusion imaging is nondiagnostic. Low LVEF is suggested however looks normal by visual evaluation. Consider correlating with echo  Physical Exam:   VS:  BP 120/70 (Patient Position: Sitting, Cuff Size: Large)   Pulse 74   Ht 5\' 8"  (1.727 m)   Wt 239 lb (108.4 kg)   SpO2 98%   BMI 36.34 kg/m    Wt Readings from Last 3 Encounters:  06/29/23 239 lb (108.4 kg)  06/23/23 230 lb (104.3 kg)  05/13/23 230 lb (104.3 kg)    GEN: Obese, 68 y.o. female in no acute distress NECK: No JVD; No carotid bruits CARDIAC: S1/S2, RRR, no murmurs, rubs, gallops RESPIRATORY:  Clear to auscultation without rales, wheezing or rhonchi  ABDOMEN: Soft, non-tender, non-distended EXTREMITIES:  No edema; No deformity   ASSESSMENT AND PLAN: .    HFmrEF, NICM Stage C, NYHA class I-II symptoms. NICM. Recent right and left heart cath revealed normal coronary arteries. EF approximately 45%. Weight gain appears to be d/t dietary indiscretions, no signs/ symptoms of volume up. Will begin Farxiga  10 mg daily. Continue Bumex and spironolactone. Was unable to tolerate Losartan. Low sodium diet, fluid restriction <2L, and daily weights encouraged. Educated to contact our office for weight gain of 2 lbs overnight or 5 lbs in one week. Care and ED precautions discussed. Will repeat BMET and CBC.   2. Coronary artery calcification Denies any chest pain. Most recent heart cath revealed normal coronary arteries.  No complications post cath. Continue current medication regimen. Heart healthy diet encouraged. Care and ED precautions discussed. Obtaining labs as mentioned above.   4. Carotid artery stenosis Carotid duplex 01/2023 revealed 1-39% bilateral ICA stenosis. Denies any concerning symptoms. Continue current medication regimen.   5. 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 Denies any concerning signs or symptoms. Care and ED precautions discussed.    6. Obesity Weight loss via diet and exercise encouraged. Discussed the impact being overweight would have on cardiovascular risk.  7. CKD stage 3b Most recent labs around her baseline.  Starting Farxiga 10 mg daily and will repeat BMET per protocol.  Avoid nephrotoxic agents.  Continue follow-up PCP.  Dispo: Follow-up with me/APP in 2-3 months or sooner if anything changes.  Signed, Sharlene Dory, NP

## 2023-07-06 ENCOUNTER — Other Ambulatory Visit: Payer: Self-pay | Admitting: Family Medicine

## 2023-07-06 DIAGNOSIS — I10 Essential (primary) hypertension: Secondary | ICD-10-CM

## 2023-07-24 LAB — CBC
Hematocrit: 43 % (ref 34.0–46.6)
Hemoglobin: 14.2 g/dL (ref 11.1–15.9)
MCH: 31 pg (ref 26.6–33.0)
MCHC: 33 g/dL (ref 31.5–35.7)
MCV: 94 fL (ref 79–97)
Platelets: 308 10*3/uL (ref 150–450)
RBC: 4.58 x10E6/uL (ref 3.77–5.28)
RDW: 13.4 % (ref 11.7–15.4)
WBC: 7.1 10*3/uL (ref 3.4–10.8)

## 2023-07-24 LAB — BASIC METABOLIC PANEL WITH GFR
BUN/Creatinine Ratio: 9 — ABNORMAL LOW (ref 12–28)
BUN: 13 mg/dL (ref 8–27)
CO2: 24 mmol/L (ref 20–29)
Calcium: 10.2 mg/dL (ref 8.7–10.3)
Chloride: 103 mmol/L (ref 96–106)
Creatinine, Ser: 1.52 mg/dL — ABNORMAL HIGH (ref 0.57–1.00)
Glucose: 86 mg/dL (ref 70–99)
Potassium: 3.9 mmol/L (ref 3.5–5.2)
Sodium: 143 mmol/L (ref 134–144)
eGFR: 38 mL/min/{1.73_m2} — ABNORMAL LOW (ref >59)

## 2023-09-06 ENCOUNTER — Other Ambulatory Visit (HOSPITAL_COMMUNITY): Payer: Self-pay | Admitting: Interventional Radiology

## 2023-09-06 DIAGNOSIS — I671 Cerebral aneurysm, nonruptured: Secondary | ICD-10-CM

## 2023-09-23 ENCOUNTER — Ambulatory Visit (HOSPITAL_COMMUNITY)
Admission: RE | Admit: 2023-09-23 | Discharge: 2023-09-23 | Disposition: A | Discharge status: Home / Self Care | Source: Ambulatory Visit | Attending: Interventional Radiology | Admitting: Interventional Radiology

## 2023-09-23 DIAGNOSIS — I671 Cerebral aneurysm, nonruptured: Secondary | ICD-10-CM | POA: Insufficient documentation

## 2023-09-23 LAB — POCT I-STAT CREATININE: Creatinine, Ser: 1.5 mg/dL — ABNORMAL HIGH (ref 0.44–1.00)

## 2023-09-23 MED ORDER — IOHEXOL 350 MG/ML SOLN
75.0000 mL | Freq: Once | INTRAVENOUS | Status: AC | PRN
  Administered 2023-09-23: 75 mL via INTRAVENOUS

## 2023-09-23 MED ORDER — SODIUM CHLORIDE (PF) 0.9 % IJ SOLN
INTRAMUSCULAR | Status: AC
  Filled 2023-09-23: qty 50

## 2023-09-24 ENCOUNTER — Encounter (HOSPITAL_COMMUNITY): Payer: Self-pay | Admitting: Interventional Radiology

## 2023-09-26 ENCOUNTER — Ambulatory Visit
Admission: EM | Admit: 2023-09-26 | Discharge: 2023-09-26 | Disposition: A | Discharge status: Home / Self Care | Attending: Family Medicine | Admitting: Family Medicine

## 2023-09-26 DIAGNOSIS — N390 Urinary tract infection, site not specified: Secondary | ICD-10-CM | POA: Insufficient documentation

## 2023-09-26 DIAGNOSIS — R300 Dysuria: Secondary | ICD-10-CM | POA: Insufficient documentation

## 2023-09-26 LAB — POCT URINALYSIS DIP (MANUAL ENTRY)
Bilirubin, UA: NEGATIVE
Glucose, UA: >=1000 mg/dL — AB
Ketones, POC UA: NEGATIVE mg/dL
Nitrite, UA: NEGATIVE
Protein Ur, POC: NEGATIVE mg/dL
Spec Grav, UA: 1.02 (ref 1.010–1.025)
Urobilinogen, UA: 1 U/dL
pH, UA: 6 (ref 5.0–8.0)

## 2023-09-26 MED ORDER — PHENAZOPYRIDINE HCL 200 MG PO TABS: 200.0000 mg | ORAL_TABLET | Freq: Three times a day (TID) | ORAL | 0 refills | Status: DC

## 2023-09-26 MED ORDER — FLUCONAZOLE 150 MG PO TABS
150.0000 mg | ORAL_TABLET | Freq: Once | ORAL | 0 refills | Status: AC
Start: 2023-09-26 — End: 2023-09-26

## 2023-09-26 MED ORDER — SULFAMETHOXAZOLE-TRIMETHOPRIM 800-160 MG PO TABS: 1.0000 | ORAL_TABLET | Freq: Two times a day (BID) | ORAL | 0 refills | Status: AC

## 2023-09-26 NOTE — ED Triage Notes (Signed)
 Pt reports possible bladder infection burning with urination urinary frequency, urinary urgency, vaginal irritation.

## 2023-09-26 NOTE — Discharge Instructions (Addendum)
 We will cover for both a yeast infection and UTI while we wait for the urine culture to give us  more information about what is causing your symptoms.  Drink plenty of water , and follow-up for significantly worsening symptoms.  We will let you know if you need to make any changes to your medications.

## 2023-09-26 NOTE — ED Provider Notes (Signed)
 [facility]    CSN: [id number] Arrival date & time: 09/26/23  1459      History   Chief Complaint No chief complaint on file.   HPI Deanna  Thompson is a 68 y.o. female.   Patient presenting today with 1 day history of dysuria, urinary frequency, urgency, vaginal irritation.  Denies fever, chills, hematuria, flank pain, nausea, vomiting, abdominal pain.  So far not trying anything over-the-counter for symptoms.  History of urinary tract infections that have felt similar.    Past Medical History:  Diagnosis Date   Abdominal hernia    repaired   Abscess of pulp of tooth 12/12/2014   left side   Anemia    Anxiety    Arthritis    back  and down flank  right side more than left   B12 deficiency 06/12/2015   Brain aneurysm    METAL COIL PREVENTS MRI IMAGING and CLIP   Cirrhosis (HCC)    Presumably alcoholic cirrhosis   Cirrhosis (HCC)    Complication of anesthesia    Depression    ETOH abuse    Quit in 07/2011    Fatigue    due to Sjogren's disease   Flat foot (pes planus) (acquired), left foot    Folate deficiency 06/30/2016   GERD (gastroesophageal reflux disease)    HTN (hypertension)    no longer medicated, dated on 04/25/12   IBS (irritable bowel syndrome)    Iron  deficiency anemia due to chronic blood loss    Myofascial pain    Panic attack    PONV (postoperative nausea and vomiting)    Psoriatic arthritis (HCC)    Right inguinal hernia 08/16/2012   Seizures (HCC) 1997   1 seizure from coil insertion but no more seizures and no meds for seizures   Sjogren's disease (HCC)    soft Bipolar disorder (HCC)    Stroke (HCC) 2019   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   Qualifier: Diagnosis of  By: Margrette MD, [place]     TIA (transient ischemic attack)    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(HCC)    Vertigo     Patient Active Problem List   Diagnosis Date Noted   Chronic CHF (congestive heart failure) (HCC) 06/16/2022   Incisional hernia,  without obstruction or gangrene 11/10/2021   Bipolar disorder (HCC) 01/13/2021   Hyperlipidemia 01/13/2021   IBS (irritable bowel syndrome)    History of stroke 08/01/2017   Essential hypertension 07/31/2017   GERD (gastroesophageal reflux disease) 07/31/2017   Brain aneurysm 07/31/2017   CKD (chronic kidney disease), stage III (HCC) 07/31/2017   LBBB (left bundle branch block) 07/31/2017   Psoriatic arthritis (HCC) 04/15/2015   Cirrhosis of liver (HCC) 09/19/2014   Iron  deficiency anemia due to chronic blood loss    Insomnia 03/17/2014    Past Surgical History:  Procedure Laterality Date   AGILE CAPSULE N/A 06/21/2014   Procedure: AGILE CAPSULE;  Surgeon: Margo LITTIE Haddock, MD;  Location: [facility];  Service: Endoscopy;  Laterality: N/A;  0700   BIOPSY  10/13/2011   SLF: mild gastritis/duodenal polypoid lesion    BIOPSY  06/04/2020   Procedure: BIOPSY;  Surgeon: Cindie Carlin POUR, DO;  Location: [facility];  Service: Endoscopy;;   CEREBRAL ANEURYSM REPAIR  1997   CHOLECYSTECTOMY N/A 10/28/2018   Procedure: LAPAROSCOPIC CHOLECYSTECTOMY WITH INTRAOPERATIVE CHOLANGIOGRAM;  Surgeon: Kallie Manuelita BROCKS, MD;  Location: [facility];  Service: General;  Laterality: N/A;   COLONOSCOPY  10/13/2011   DOQ:[id number]  hemorrhoids/varices rectal/lesion in ascending colon(HYPERPLASTIC POLYP)   COLONOSCOPY N/A 01/17/2013   SLF: 1. Internal hemorrhoids 2. Ascending colon polyps 3.  Rectal varices.    COLONOSCOPY N/A 08/10/2016   Dr. Harvey: redundant left colon. external and internal hemorhroids. 5-10 year surveillance   ESOPHAGOGASTRODUODENOSCOPY N/A 06/29/2014   SLF: 1. anemia due to polypoid lesions in teh antrum, and duodenal polyps 2. single duodenum AVM   ESOPHAGOGASTRODUODENOSCOPY (EGD) WITH PROPOFOL  N/A 03/07/2019   Procedure: ESOPHAGOGASTRODUODENOSCOPY (EGD) WITH PROPOFOL ;  Surgeon: Harvey Margo CROME, MD;  [facility]: [facility];  Service: Endoscopy;  Laterality: N/A;  1:00pm    ESOPHAGOGASTRODUODENOSCOPY (EGD) WITH PROPOFOL  N/A 03/16/2019   Procedure: ESOPHAGOGASTRODUODENOSCOPY (EGD) WITH PROPOFOL ;  Surgeon: Harvey Margo CROME, MD;  [facility]: [facility];  Service: Endoscopy;  Laterality: N/A;   ESOPHAGOGASTRODUODENOSCOPY (EGD) WITH PROPOFOL  N/A 06/04/2020   Procedure: ESOPHAGOGASTRODUODENOSCOPY (EGD) WITH PROPOFOL ;  Surgeon: Cindie Carlin POUR, DO;  [facility]: [facility];  Service: Endoscopy;  Laterality: N/A;  am   FLEXIBLE SIGMOIDOSCOPY N/A 01/21/2016   Procedure: FLEXIBLE SIGMOIDOSCOPY;  Surgeon: Margo CROME Harvey, MD;  [facility]: [facility];  Service: Endoscopy;  Laterality: N/A;  245   GIVENS CAPSULE STUDY N/A 06/29/2014   Procedure: GIVENS CAPSULE STUDY;  Surgeon: Margo CROME Harvey, MD;  [facility]: [facility];  Service: Endoscopy;  Laterality: N/A;   GIVENS CAPSULE STUDY N/A 07/13/2014   Procedure: GIVENS CAPSULE STUDY;  Surgeon: Margo CROME Harvey, MD;  [facility]: [facility];  Service: Endoscopy;  Laterality: N/A;  700   HEMORRHOID BANDING N/A 01/21/2016   Procedure: HEMORRHOID BANDING;  Surgeon: Margo CROME Harvey, MD;  [facility]: [facility];  Service: Endoscopy;  Laterality: N/A;   HEMOSTASIS CLIP PLACEMENT  03/16/2019   Procedure: HEMOSTASIS CLIP PLACEMENT;  Surgeon: Harvey Margo CROME, MD;  [facility]: [facility];  Service: Endoscopy;;  x 4   HEMOSTASIS CONTROL  03/16/2019   Procedure: HEMOSTASIS CONTROL;  Surgeon: Harvey Margo CROME, MD;  [facility]: [facility];  Service: Endoscopy;;  injection with epinephine 0.1mg /ml via scope and bipolar cautery with gold probe   INGUINAL HERNIA REPAIR Right 08/28/2013   Procedure: RIGHT NGUINAL HERNIORRHAPHY WITH MESH;  Surgeon: Oneil DELENA Budge, MD;  Location: [facility];  Service: General;  Laterality: Right;   INSERTION OF MESH N/A 06/09/2013   Procedure: INSERTION OF MESH;  Surgeon: Oneil DELENA Budge, MD;  Location: [facility];  Service: General;  Laterality: N/A;   INSERTION OF MESH Right 08/28/2013   Procedure: INSERTION OF MESH;  Surgeon: Oneil DELENA Budge, MD;  Location: [facility];  Service: General;  Laterality: Right;   IR RADIOLOGIST EVAL & MGMT  02/17/2023   PARACENTESIS  Feb 2014   10.7 liters   RIGHT/LEFT HEART CATH AND CORONARY ANGIOGRAPHY N/A 04/22/2023   Procedure: RIGHT/LEFT HEART CATH AND CORONARY ANGIOGRAPHY;  Surgeon: Ladona Heinz, MD;  [facility]: [facility];  Service: Cardiovascular;  Laterality: N/A;   TOOTH EXTRACTION Left 12/12/14   UMBILICAL HERNIA REPAIR N/A 06/09/2013   Procedure: UMBILICAL HERNIORRHAPHY WITH MESH;  Surgeon: Oneil DELENA Budge, MD;  Location: [facility];  Service: General;  Laterality: N/A;    OB History     Gravida  0   Para  0   Term  0   Preterm  0   AB  0   Living  0      SAB  0   IAB  0   Ectopic  0   Multiple  0  Live Births  0            Home Medications    Prior to Admission medications   Medication Sig Start Date End Date Taking? Authorizing Provider  fluconazole (DIFLUCAN) 150 MG tablet Take 1 tablet (150 mg total) by mouth once for 1 dose. 09/26/23 09/26/23 Yes Stuart Vernell Norris, PA-C  phenazopyridine  (PYRIDIUM ) 200 MG tablet Take 1 tablet (200 mg total) by mouth 3 (three) times daily. 09/26/23  Yes Stuart Vernell Norris, PA-C  sulfamethoxazole -trimethoprim  (BACTRIM  DS) 800-160 MG tablet Take 1 tablet by mouth 2 (two) times daily for 3 days. 09/26/23 09/29/23 Yes Stuart Vernell Norris, PA-C  acetaminophen  (TYLENOL ) 500 MG tablet Take 500 mg by mouth every 6 (six) hours as needed for moderate pain (pain score 4-6).    [provider]  atorvastatin  (LIPITOR ) 40 MG tablet TAKE ONE TABLET BY MOUTH ONCE DAILY. 06/09/23   Cook, Jayce G, DO  bumetanide  (BUMEX ) 1 MG tablet TAKE (1) TABLET BY MOUTH TWICE DAILY. 04/16/23   Cook, Jayce G, DO  buPROPion  (WELLBUTRIN  XL) 300 MG 24 hr tablet Take 300 mg by mouth daily. 04/25/22   [provider]  Camphor-Menthol -Methyl Sal (SALONPAS) 3.04-04-08 % PTCH Place 1 patch onto the skin daily as needed (pain.). Patient not  taking: Reported on 06/23/2023    [provider]  cholecalciferol  (VITAMIN D3) 25 MCG (1000 UT) tablet Take 2 tablets (2,000 Units total) by mouth daily. 12/14/18   Fields, Margo CROME, MD  dapagliflozin  propanediol (FARXIGA ) 10 MG TABS tablet Take 1 tablet (10 mg total) by mouth daily. 06/29/23   Miriam Norris, NP  DULoxetine (CYMBALTA) 60 MG capsule Take 60 mg by mouth daily.    [provider]  famotidine  (PEPCID ) 20 MG tablet Take 1 tablet (20 mg total) by mouth 2 (two) times daily before a meal. 10/17/19   Rudy Josette RAMAN, PA-C  fluticasone  (FLONASE ) 50 MCG/ACT nasal spray Place 1 spray into both nostrils See admin instructions. Instill 1 spray into each nostril every morning, may also use later in the day as needed for sinus congestion    [provider]  HYDROcodone -acetaminophen  (NORCO/VICODIN) 5-325 MG tablet Take 1 tablet by mouth every 6 (six) hours as needed. Patient not taking: Reported on 06/29/2023 05/18/23   [provider]  lamoTRIgine (LAMICTAL) 200 MG tablet Take 400 mg by mouth daily. 02/06/21   [provider]  LINZESS  145 MCG CAPS capsule TAKE (1) CAPSULE BY MOUTH EVERY DAY BEFORE BREAKFAST. 06/20/23   Federico Rosario BROCKS, MD  loratadine  (CLARITIN ) 10 MG tablet Take 10 mg by mouth daily as needed for allergies.    [provider]  Polyvinyl Alcohol -Povidone (REFRESH OP) Place 1 drop into both eyes 3 (three) times daily as needed (dry eyes).    [provider]  Simethicone  (GAS-X PO) Take 1 tablet by mouth 2 (two) times daily as needed (bloating/flatulence).    [provider]  sodium chloride  (OCEAN) 0.65 % nasal spray Place 1 spray into the nose 2 (two) times daily as needed for congestion.    [provider]  spironolactone  (ALDACTONE ) 100 MG tablet TAKE 1/2 TABLET BY MOUTH TWICE DAILY. 07/06/23   Cook, Jayce G, DO  Tofacitinib  Citrate ER (XELJANZ  XR) 11 MG TB24 Take 1 tablet by mouth daily.    [provider]  XIIDRA  5 % SOLN Apply 1 drop to eye 2 (two) times daily. 09/08/21   [provider]    Family History  Family History  Problem Relation Age of Onset   Cirrhosis Mother        nash   COPD Father    Heart disease Father    Asthma Other    Heart disease Other    Colon cancer Neg Hx    Inflammatory bowel disease Neg Hx    Stomach cancer Neg Hx    Esophageal cancer Neg Hx     Social History Social History   Tobacco Use   Smoking status: Former    Current packs/day: 0.00    Average packs/day: 2.0 packs/day for 12.0 years (24.0 ttl pk-yrs)    Types: Cigarettes    Start date: 03/31/1983    Quit date: 03/31/1995    Years since quitting: 28.5   Smokeless tobacco: Never   Tobacco comments:    quit in 1997  Vaping Use   Vaping status: Never Used  Substance Use Topics   Alcohol  use: No    Comment: Bottle of wine a day for years, but quit in 07/2011   Drug use: No     Allergies   Keflex [cephalexin], Omeprazole, Orencia [abatacept], Other, Phenytoin, Lidocaine  viscous hcl, and Promethazine  hcl   Review of Systems Review of Systems Per HPI  Physical Exam Triage Vital Signs ED Triage Vitals  Encounter Vitals Group     BP 09/26/23 1517 127/80     Girls Systolic BP Percentile --      Girls Diastolic BP Percentile --      Boys Systolic BP Percentile --      Boys Diastolic BP Percentile --      Pulse Rate 09/26/23 1517 71     Resp 09/26/23 1517 20     Temp 09/26/23 1517 98.3 F (36.8 C)     Temp Source 09/26/23 1517 Oral     SpO2 09/26/23 1517 95 %     Weight --      Height --      Head Circumference --      Peak Flow --      Pain Score 09/26/23 1520 0     Pain Loc --      Pain Education --      Exclude from Growth Chart --    No data found.  Updated Vital Signs BP 127/80 (BP Location: Right Arm)   Pulse 71   Temp 98.3 F (36.8 C) (Oral)   Resp 20   SpO2 95%   Visual Acuity Right Eye Distance:   Left Eye Distance:   Bilateral  Distance:    Right Eye Near:   Left Eye Near:    Bilateral Near:     Physical Exam Vitals and nursing note reviewed.  Constitutional:      Appearance: Normal appearance. She is not ill-appearing.  HENT:     Head: Atraumatic.   Eyes:     Extraocular Movements: Extraocular movements intact.     Conjunctiva/sclera: Conjunctivae normal.    Cardiovascular:     Rate and Rhythm: Normal rate.  Pulmonary:     Effort: Pulmonary effort is normal.  Abdominal:     General: Abdomen is flat. There is no distension.     Palpations: Abdomen is soft.     Tenderness: There is no abdominal tenderness. There is no right CVA tenderness, left CVA tenderness or guarding.   Musculoskeletal:        General: Normal range of motion.     Cervical back: Normal range of motion and neck supple.  Skin:    General: Skin is warm and dry.   Neurological:     Mental Status: She is alert and oriented to person, place, and time.   Psychiatric:        Mood and Affect: Mood normal.        Thought Content: Thought content normal.        Judgment: Judgment normal.      UC Treatments / Results  Labs (all labs ordered are listed, but only abnormal results are displayed) Labs Reviewed  POCT URINALYSIS DIP (MANUAL ENTRY) - Abnormal; Notable for the following components:      Result Value   Glucose, UA >=1,000 (*)    Blood, UA large (*)    Leukocytes, UA Trace (*)    All other components within normal limits  URINE CULTURE    EKG   Radiology No results found.  Procedures Procedures (including critical care time)  Medications Ordered in UC Medications - No data to display  Initial Impression / Assessment and Plan / UC Course  I have reviewed the triage vital signs and the nursing notes.  Pertinent labs & imaging results that were available during my care of the patient were reviewed by me and considered in my medical decision making (see chart for details).     Vitals and exam reassuring  today, urinalysis with trace leuks, large blood and greater than 1000 glucose which is likely secondary to her SGLT2 inhibitor.  Not altogether convincing for a true urinary tract infection, possibly yeast vaginitis but given her history of UTIs and significant symptoms will cover for UTI with Bactrim  as well as Diflucan in case yeast while awaiting urine culture.  Adjust if needed based on results.  Final Clinical Impressions(s) / UC Diagnoses   Final diagnoses:  Acute lower UTI  Dysuria     Discharge Instructions      We will cover for both a yeast infection and UTI while we wait for the urine culture to give us  more information about what is causing your symptoms.  Drink plenty of water , and follow-up for significantly worsening symptoms.  We will let you know if you need to make any changes to your medications.    ED Prescriptions     Medication Sig Dispense Auth. Provider   fluconazole (DIFLUCAN) 150 MG tablet Take 1 tablet (150 mg total) by mouth once for 1 dose. 1 tablet Stuart Vernell Norris, PA-C   sulfamethoxazole -trimethoprim  (BACTRIM  DS) 800-160 MG tablet Take 1 tablet by mouth 2 (two) times daily for 3 days. 6 tablet Stuart Vernell Norris, PA-C   phenazopyridine  (PYRIDIUM ) 200 MG tablet Take 1 tablet (200 mg total) by mouth 3 (three) times daily. 6 tablet Stuart Vernell Norris, [place]      PDMP not reviewed this encounter.   Stuart Vernell Norris, PA-C 09/26/23 1558

## 2023-09-28 ENCOUNTER — Ambulatory Visit (HOSPITAL_COMMUNITY): Payer: Self-pay

## 2023-09-28 LAB — URINE CULTURE: Culture: 40000 — AB

## 2023-10-05 ENCOUNTER — Telehealth (HOSPITAL_COMMUNITY): Payer: Self-pay

## 2023-10-05 NOTE — Telephone Encounter (Signed)
 Pt is aware that Dr. Dolphus is leaving and agreed to f/u with PCP regarding next imaging. AB

## 2023-10-07 ENCOUNTER — Ambulatory Visit: Admitting: Nurse Practitioner

## 2023-10-21 ENCOUNTER — Inpatient Hospital Stay: Attending: Hematology

## 2023-10-21 ENCOUNTER — Other Ambulatory Visit: Payer: Self-pay | Admitting: Family Medicine

## 2023-10-21 DIAGNOSIS — D500 Iron deficiency anemia secondary to blood loss (chronic): Secondary | ICD-10-CM | POA: Insufficient documentation

## 2023-10-21 DIAGNOSIS — K31811 Angiodysplasia of stomach and duodenum with bleeding: Secondary | ICD-10-CM | POA: Insufficient documentation

## 2023-10-21 DIAGNOSIS — Z79899 Other long term (current) drug therapy: Secondary | ICD-10-CM | POA: Insufficient documentation

## 2023-10-21 LAB — FERRITIN: Ferritin: 23 ng/mL (ref 11–307)

## 2023-10-21 LAB — CBC WITH DIFFERENTIAL/PLATELET
Abs Immature Granulocytes: 0.02 K/uL (ref 0.00–0.07)
Basophils Absolute: 0.1 K/uL (ref 0.0–0.1)
Basophils Relative: 1 %
Eosinophils Absolute: 0.1 K/uL (ref 0.0–0.5)
Eosinophils Relative: 2 %
HCT: 45.3 % (ref 36.0–46.0)
Hemoglobin: 14.9 g/dL (ref 12.0–15.0)
Immature Granulocytes: 0 %
Lymphocytes Relative: 22 %
Lymphs Abs: 1.3 K/uL (ref 0.7–4.0)
MCH: 31.2 pg (ref 26.0–34.0)
MCHC: 32.9 g/dL (ref 30.0–36.0)
MCV: 95 fL (ref 80.0–100.0)
Monocytes Absolute: 0.6 K/uL (ref 0.1–1.0)
Monocytes Relative: 9 %
Neutro Abs: 3.8 K/uL (ref 1.7–7.7)
Neutrophils Relative %: 66 %
Platelets: 279 K/uL (ref 150–400)
RBC: 4.77 MIL/uL (ref 3.87–5.11)
RDW: 14.6 % (ref 11.5–15.5)
WBC: 5.9 K/uL (ref 4.0–10.5)
nRBC: 0 % (ref 0.0–0.2)

## 2023-10-21 LAB — IRON AND TIBC
Iron: 96 ug/dL (ref 28–170)
Saturation Ratios: 25 % (ref 10.4–31.8)
TIBC: 392 ug/dL (ref 250–450)
UIBC: 296 ug/dL

## 2023-11-01 NOTE — Progress Notes (Unsigned)
 [facility] [address], [place] [address]   CLINIC:  Medical Oncology/Hematology  PCP:  Deanna Jacqulyn MATSU, DO [address] [place] [address] [phone]   REASON FOR VISIT: Iron  deficiency anemia (bleeding AVMs)   CURRENT THERAPY: Intermittent IV iron   INTERVAL HISTORY:  [name] is seen in clinic today for follow-up of her iron  deficiency anemia.  She was last seen evaluated via telemedicine visit by Pleasant Barefoot PA-C on 05/05/2023.    At today's visit, she reports feeling fairly well.   She does report some increased fatigue and is starting to chew ice again. She also reports mild dyspnea on exertion.   She denies any restless legs, headaches, chest pain, lightheadedness, or syncope.   She has a history of bleeding AVMs, but has not had any recent hematemesis, hematochezia, or melena. She has 25% energy and 100% appetite. She endorses that she is maintaining a stable weight.   ASSESSMENT & PLAN:  1.  Iron  deficiency anemia due to chronic blood loss - Due to chronic GI blood loss. - Patient was hospitalized in December 2020 for an AVM bleed where her hemoglobin dropped to 7.7. 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  Injected.  Treated with bipolar cautery.  Clips were placed. - EGD (06/23/2023): Salmon-colored mucosa (biopsied, squamocolumnar junction with chronic inflammation and no intestinal metaplasia, dysplasia, or malignancy identified).  Single medium angioectasia without bleeding in gastric body.  Duodenal polyp (peptic duodenitis, polypoid.  No dysplasia or malignancy.). - She has been transfused RBCs in the past - Most recent IV iron  with Venofer  400 mg x 02 May 2023 - No bright red blood per rectum or melena - Most recent labs (10/21/2023): Hgb 14.9/MCV 95.0, ferritin 23, iron  saturation 25% - PLAN: IV Venofer  400 mg x 2 - Labs and RTC in 6  months with PHONE visit   2.  Other history - Her other pertinent medical history includes CKD stage IIIa,  liver cirrhosis, history of cerebral aneurysm with stroke, hyperlipidemia, and hypertension     PLAN SUMMARY: >> IV Venofer  400 mg x 2 >> Labs in 6 months = CBC/D, ferritin, iron /TIBC >> PHONE visit in 6 months   ** Last office visit 11/02/23      REVIEW OF SYSTEMS:   Review of Systems  Constitutional:  Positive for fatigue. Negative for appetite change, chills, diaphoresis, fever and unexpected weight change.  HENT:   Negative for lump/mass and nosebleeds.   Eyes:  Negative for eye problems.  Respiratory:  Positive for cough. Negative for hemoptysis and shortness of breath.   Cardiovascular:  Negative for chest pain, leg swelling and palpitations.  Gastrointestinal:  Positive for constipation and diarrhea. Negative for abdominal pain, blood in stool, nausea and vomiting.  Genitourinary:  Negative for hematuria.   Skin: Negative.   Neurological:  Positive for numbness. Negative for dizziness, headaches and light-headedness.  Hematological:  Does not bruise/bleed easily.     PHYSICAL EXAM:  ECOG PERFORMANCE STATUS: 1 - Symptomatic but completely ambulatory  Vitals:   11/02/23 1305  BP: 125/75  Pulse: 79  Resp: 18  Temp: 98.5 F (36.9 C)  SpO2: 96%   Filed Weights   11/02/23 1305  Weight: 229 lb (103.9 kg)   Physical Exam Constitutional:      Appearance: Normal appearance. She is obese.  Cardiovascular:     Heart sounds:  Normal heart sounds.  Pulmonary:     Breath sounds: Normal breath sounds.  Neurological:     General: No focal deficit present.     Mental Status: Mental status is at baseline.  Psychiatric:        Behavior: Behavior normal. Behavior is cooperative.     PAST MEDICAL/SURGICAL HISTORY:  Past Medical History:  Diagnosis Date   Abdominal hernia    repaired   Abscess of pulp of tooth 12/12/2014   left side   Anemia    Anxiety     Arthritis    back  and down flank  right side more than left   B12 deficiency 06/12/2015   Brain aneurysm    METAL COIL PREVENTS MRI IMAGING and CLIP   Cirrhosis (HCC)    Presumably alcoholic cirrhosis   Cirrhosis (HCC)    Complication of anesthesia    Depression    ETOH abuse    Quit in 07/2011    Fatigue    due to Sjogren's disease   Flat foot (pes planus) (acquired), left foot    Folate deficiency 06/30/2016   GERD (gastroesophageal reflux disease)    HTN (hypertension)    no longer medicated, dated on 04/25/12   IBS (irritable bowel syndrome)    Iron  deficiency anemia due to chronic blood loss    Myofascial pain    Panic attack    PONV (postoperative nausea and vomiting)    Psoriatic arthritis (HCC)    Right inguinal hernia 08/16/2012   Seizures (HCC) 1997   1 seizure from coil insertion but no more seizures and no meds for seizures   Sjogren's disease (HCC)    soft Bipolar disorder (HCC)    Stroke (HCC) 2019   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   Qualifier: Diagnosis of  By: Margrette MD, [place]     TIA (transient ischemic attack)    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(HCC)    Vertigo    Past Surgical History:  Procedure Laterality Date   AGILE CAPSULE N/A 06/21/2014   Procedure: AGILE CAPSULE;  Surgeon: Margo LITTIE Haddock, MD;  [facility];  Service: Endoscopy;  Laterality: N/A;  0700   BIOPSY  10/13/2011   SLF: mild gastritis/duodenal polypoid lesion    BIOPSY  06/04/2020   Procedure: BIOPSY;  Surgeon: Cindie Carlin POUR, DO;  [facility];  Service: Endoscopy;;   CEREBRAL ANEURYSM REPAIR  1997   CHOLECYSTECTOMY N/A 10/28/2018   Procedure: LAPAROSCOPIC CHOLECYSTECTOMY WITH INTRAOPERATIVE CHOLANGIOGRAM;  Surgeon: Kallie Manuelita BROCKS, MD;  Location: [facility];  Service: General;  Laterality: N/A;   COLONOSCOPY  10/13/2011   DOQ:pwuzmwjo hemorrhoids/varices rectal/lesion in ascending colon(HYPERPLASTIC POLYP)   COLONOSCOPY N/A 01/17/2013   SLF: 1.  Internal hemorrhoids 2. Ascending colon polyps 3.  Rectal varices.    COLONOSCOPY N/A 08/10/2016   Dr. Haddock: redundant left colon. external and internal hemorhroids. 5-10 year surveillance   ESOPHAGOGASTRODUODENOSCOPY N/A 06/29/2014   SLF: 1. anemia due to polypoid lesions in teh antrum, and duodenal polyps 2. single duodenum AVM   ESOPHAGOGASTRODUODENOSCOPY (EGD) WITH PROPOFOL  N/A 03/07/2019   Procedure: ESOPHAGOGASTRODUODENOSCOPY (EGD) WITH PROPOFOL ;  Surgeon: Haddock Margo LITTIE, MD;  [facility];  Service: Endoscopy;  Laterality: N/A;  1:00pm   ESOPHAGOGASTRODUODENOSCOPY (EGD) WITH PROPOFOL  N/A 03/16/2019   Procedure: ESOPHAGOGASTRODUODENOSCOPY (EGD) WITH PROPOFOL ;  Surgeon: Haddock Margo LITTIE, MD;  [facility];  Service: Endoscopy;  Laterality: N/A;   ESOPHAGOGASTRODUODENOSCOPY (EGD) WITH PROPOFOL  N/A 06/04/2020   Procedure:  ESOPHAGOGASTRODUODENOSCOPY (EGD) WITH PROPOFOL ;  Surgeon: Cindie Carlin POUR, DO;  [facility];  Service: Endoscopy;  Laterality: N/A;  am   FLEXIBLE SIGMOIDOSCOPY N/A 01/21/2016   Procedure: FLEXIBLE SIGMOIDOSCOPY;  Surgeon: Margo LITTIE Haddock, MD;  [facility];  Service: Endoscopy;  Laterality: N/A;  245   GIVENS CAPSULE STUDY N/A 06/29/2014   Procedure: GIVENS CAPSULE STUDY;  Surgeon: Margo LITTIE Haddock, MD;  [facility];  Service: Endoscopy;  Laterality: N/A;   GIVENS CAPSULE STUDY N/A 07/13/2014   Procedure: GIVENS CAPSULE STUDY;  Surgeon: Margo LITTIE Haddock, MD;  [facility];  Service: Endoscopy;  Laterality: N/A;  700   HEMORRHOID BANDING N/A 01/21/2016   Procedure: HEMORRHOID BANDING;  Surgeon: Margo LITTIE Haddock, MD;  [facility];  Service: Endoscopy;  Laterality: N/A;   HEMOSTASIS CLIP PLACEMENT  03/16/2019   Procedure: HEMOSTASIS CLIP PLACEMENT;  Surgeon: Haddock Margo LITTIE, MD;  [facility];  Service: Endoscopy;;  x 4   HEMOSTASIS CONTROL  03/16/2019   Procedure: HEMOSTASIS CONTROL;  Surgeon:  Haddock Margo LITTIE, MD;  [facility];  Service: Endoscopy;;  injection with epinephine 0.1mg /ml via scope and bipolar cautery with gold probe   INGUINAL HERNIA REPAIR Right 08/28/2013   Procedure: RIGHT NGUINAL HERNIORRHAPHY WITH MESH;  Surgeon: Oneil DELENA Budge, MD;  Location: [facility];  Service: General;  Laterality: Right;   INSERTION OF MESH N/A 06/09/2013   Procedure: INSERTION OF MESH;  Surgeon: Oneil DELENA Budge, MD;  [facility]: [facility];  Service: General;  Laterality: N/A;   INSERTION OF MESH Right 08/28/2013   Procedure: INSERTION OF MESH;  Surgeon: Oneil DELENA Budge, MD;  [facility]: [facility];  Service: General;  Laterality: Right;   IR RADIOLOGIST EVAL & MGMT  02/17/2023   PARACENTESIS  Feb 2014   10.7 liters   RIGHT/LEFT HEART CATH AND CORONARY ANGIOGRAPHY N/A 04/22/2023   Procedure: RIGHT/LEFT HEART CATH AND CORONARY ANGIOGRAPHY;  Surgeon: Ladona Heinz, MD;  [facility];  Service: Cardiovascular;  Laterality: N/A;   TOOTH EXTRACTION Left 12/12/14   UMBILICAL HERNIA REPAIR N/A 06/09/2013   Procedure: UMBILICAL HERNIORRHAPHY WITH MESH;  Surgeon: Oneil DELENA Budge, MD;  Location: [facility];  Service: General;  Laterality: N/A;    SOCIAL HISTORY:  Social History   Socioeconomic History   Marital status: Married    Spouse name: Transport planner   Number of children: 0   Years of education: Not on file   Highest education level: Master's degree (e.g., MA, MS, MEng, MEd, MSW, MBA)  Occupational History    Employer: RETIRED  Tobacco Use   Smoking status: Former    Current packs/day: 0.00    Average packs/day: 2.0 packs/day for 12.0 years (24.0 ttl pk-yrs)    Types: Cigarettes    Start date: 03/31/1983    Quit date: 03/31/1995    Years since quitting: 28.6   Smokeless tobacco: Never   Tobacco comments:    quit in 1997  Vaping Use   Vaping status: Never Used  Substance and Sexual Activity   Alcohol  use: No    Comment: Bottle of wine a day for years, but quit in 07/2011   Drug use: No    Sexual activity: Not Currently    Birth control/protection: Post-menopausal  Other Topics Concern   Not on file  Social History Narrative   Lives   Caffeine use:    Quit drinking alcohol  3 YEARS ago. No drugs,  no IVDU.    Social Drivers of Corporate investment banker Strain: Low Risk  (11/01/2023)   Overall Financial Resource Strain (CARDIA)    Difficulty of Paying Living Expenses: Not hard at all  Food Insecurity: No Food Insecurity (11/01/2023)   Hunger Vital Sign    Worried About Running Out of Food in the Last Year: Never true    Ran Out of Food in the Last Year: Never true  Transportation Needs: No Transportation Needs (11/01/2023)   PRAPARE - Administrator, Civil Service (Medical): No    Lack of Transportation (Non-Medical): No  Physical Activity: Insufficiently Active (11/01/2023)   Exercise Vital Sign    Days of Exercise per Week: 1 day    Minutes of Exercise per Session: 20 min  Stress: Stress Concern Present (11/01/2023)   [employer] of Occupational Health - Occupational Stress Questionnaire    Feeling of Stress: To some extent  Social Connections: Moderately Isolated (11/01/2023)   Social Connection and Isolation Panel    Frequency of Communication with Friends and Family: Three times a week    Frequency of Social Gatherings with Friends and Family: Twice a week    Attends Religious Services: Never    Database administrator or Organizations: No    Attends Engineer, structural: Not on file    Marital Status: Married  Catering manager Violence: Not At Risk (07/21/2022)   Humiliation, Afraid, Rape, and Kick questionnaire    Fear of Current or Ex-Partner: No    Emotionally Abused: No    Physically Abused: No    Sexually Abused: No    FAMILY HISTORY:  Family History  Problem Relation Age of Onset   Cirrhosis Mother        nash   COPD Father    Heart disease Father    Asthma Other    Heart disease Other    Colon cancer Neg Hx    Inflammatory  bowel disease Neg Hx    Stomach cancer Neg Hx    Esophageal cancer Neg Hx     CURRENT MEDICATIONS:  Outpatient Encounter Medications as of 11/02/2023  Medication Sig   acetaminophen  (TYLENOL ) 500 MG tablet Take 500 mg by mouth every 6 (six) hours as needed for moderate pain (pain score 4-6).   atorvastatin  (LIPITOR ) 40 MG tablet TAKE ONE TABLET BY MOUTH ONCE DAILY.   bumetanide  (BUMEX ) 1 MG tablet TAKE (1) TABLET BY MOUTH TWICE DAILY.   buPROPion  (WELLBUTRIN  XL) 300 MG 24 hr tablet Take 300 mg by mouth daily.   Camphor-Menthol -Methyl Sal (SALONPAS) 3.04-04-08 % PTCH Place 1 patch onto the skin daily as needed (pain.).   cholecalciferol  (VITAMIN D3) 25 MCG (1000 UT) tablet Take 2 tablets (2,000 Units total) by mouth daily.   dapagliflozin  propanediol (FARXIGA ) 10 MG TABS tablet Take 1 tablet (10 mg total) by mouth daily.   DULoxetine (CYMBALTA) 60 MG capsule Take 60 mg by mouth daily.   famotidine  (PEPCID ) 20 MG tablet Take 1 tablet (20 mg total) by mouth 2 (two) times daily before a meal.   fluticasone  (FLONASE ) 50 MCG/ACT nasal spray Place 1 spray into both nostrils See admin instructions. Instill 1 spray into each nostril every morning, may also use later in the day as needed for sinus congestion   HYDROcodone -acetaminophen  (NORCO/VICODIN) 5-325 MG tablet Take 1 tablet by mouth every 6 (six) hours as needed.   lamoTRIgine (LAMICTAL) 200 MG tablet Take 400 mg by mouth daily.  LINZESS  145 MCG CAPS capsule TAKE (1) CAPSULE BY MOUTH EVERY DAY BEFORE BREAKFAST.   loratadine  (CLARITIN ) 10 MG tablet Take 10 mg by mouth daily as needed for allergies.   phenazopyridine  (PYRIDIUM ) 200 MG tablet Take 1 tablet (200 mg total) by mouth 3 (three) times daily.   Polyvinyl Alcohol -Povidone (REFRESH OP) Place 1 drop into both eyes 3 (three) times daily as needed (dry eyes).   Simethicone  (GAS-X PO) Take 1 tablet by mouth 2 (two) times daily as needed (bloating/flatulence).   sodium chloride  (OCEAN) 0.65 %  nasal spray Place 1 spray into the nose 2 (two) times daily as needed for congestion.   spironolactone  (ALDACTONE ) 100 MG tablet TAKE 1/2 TABLET BY MOUTH TWICE DAILY.   Tofacitinib  Citrate ER (XELJANZ  XR) 11 MG TB24 Take 1 tablet by mouth daily.   XIIDRA  5 % SOLN Apply 1 drop to eye 2 (two) times daily.   No facility-administered encounter medications on file as of 11/02/2023.    ALLERGIES:  Allergies  Allergen Reactions   Keflex [Cephalexin] Other (See Comments)    Severe yeast infection   Omeprazole Shortness Of Breath   Orencia [Abatacept] Anaphylaxis, Hives and Rash   Other Shortness Of Breath, Rash and Other (See Comments)    Red Meat Causes shortness of breath, rash and flu like symptoms.   Alpha Gal   Phenytoin Rash    Dilantin   Lidocaine  Viscous Hcl Nausea And Vomiting    NAUSEA/PROLONGED NUMBNESS   Promethazine  Hcl Other (See Comments)    Not in right state of mind, HALLUCINATION Other reaction(s): mental confusion    LABORATORY DATA:  I have reviewed the labs as listed.  CBC    Component Value Date/Time   WBC 5.9 10/21/2023 1427   RBC 4.77 10/21/2023 1427   HGB 14.9 10/21/2023 1427   HGB 14.2 07/23/2023 1048   HCT 45.3 10/21/2023 1427   HCT 43.0 07/23/2023 1048   HCT 41 07/30/2011 0743   PLT 279 10/21/2023 1427   PLT 308 07/23/2023 1048   MCV 95.0 10/21/2023 1427   MCV 94 07/23/2023 1048   MCV 104.3 07/30/2011 0743   MCH 31.2 10/21/2023 1427   MCHC 32.9 10/21/2023 1427   RDW 14.6 10/21/2023 1427   RDW 13.4 07/23/2023 1048   LYMPHSABS 1.3 10/21/2023 1427   LYMPHSABS 1.3 03/27/2019 0854   MONOABS 0.6 10/21/2023 1427   EOSABS 0.1 10/21/2023 1427   EOSABS 0.2 03/27/2019 0854   BASOSABS 0.1 10/21/2023 1427   BASOSABS 0.1 03/27/2019 0854      Latest Ref Rng & Units 09/23/2023   12:30 PM 07/23/2023   10:48 AM 05/13/2023   11:52 AM  CMP  Glucose 70 - 99 mg/dL  86  878   BUN 8 - 27 mg/dL  13  16   Creatinine 9.55 - 1.00 mg/dL 8.49  8.47  8.53   Sodium  134 - 144 mmol/L  143  140   Potassium 3.5 - 5.2 mmol/L  3.9  4.2   Chloride 96 - 106 mmol/L  103  101   CO2 20 - 29 mmol/L  24  27   Calcium  8.7 - 10.3 mg/dL  89.7  89.5   Total Protein 6.0 - 8.3 g/dL   7.9   Total Bilirubin 0.2 - 1.2 mg/dL   0.6   Alkaline Phos 39 - 117 U/L   97   AST 0 - 37 U/L   28   ALT 0 - 35 U/L  25     DIAGNOSTIC IMAGING:  I have independently reviewed the relevant imaging and discussed with the patient.   WRAP UP:  All questions were answered. The patient knows to call the clinic with any problems, questions or concerns.  Medical decision making: Moderate  Time spent on visit: I spent 20 minutes counseling the patient face to face. The total time spent in the appointment was 30 minutes and more than 50% was on counseling.  Pleasant CHRISTELLA Barefoot, PA-C  11/02/23 1:36 PM

## 2023-11-02 ENCOUNTER — Inpatient Hospital Stay: Payer: Medicare HMO

## 2023-11-02 ENCOUNTER — Inpatient Hospital Stay: Payer: Medicare HMO | Attending: Hematology | Admitting: Physician Assistant

## 2023-11-02 DIAGNOSIS — Z860102 Personal history of hyperplastic colon polyps: Secondary | ICD-10-CM | POA: Insufficient documentation

## 2023-11-02 DIAGNOSIS — N1831 Chronic kidney disease, stage 3a: Secondary | ICD-10-CM | POA: Diagnosis not present

## 2023-11-02 DIAGNOSIS — Z9049 Acquired absence of other specified parts of digestive tract: Secondary | ICD-10-CM | POA: Insufficient documentation

## 2023-11-02 DIAGNOSIS — D500 Iron deficiency anemia secondary to blood loss (chronic): Secondary | ICD-10-CM | POA: Diagnosis present

## 2023-11-02 DIAGNOSIS — K7030 Alcoholic cirrhosis of liver without ascites: Secondary | ICD-10-CM | POA: Insufficient documentation

## 2023-11-02 DIAGNOSIS — K31811 Angiodysplasia of stomach and duodenum with bleeding: Secondary | ICD-10-CM | POA: Insufficient documentation

## 2023-11-02 DIAGNOSIS — L4050 Arthropathic psoriasis, unspecified: Secondary | ICD-10-CM | POA: Insufficient documentation

## 2023-11-02 DIAGNOSIS — Z8719 Personal history of other diseases of the digestive system: Secondary | ICD-10-CM | POA: Insufficient documentation

## 2023-11-02 DIAGNOSIS — E785 Hyperlipidemia, unspecified: Secondary | ICD-10-CM | POA: Insufficient documentation

## 2023-11-02 DIAGNOSIS — R5383 Other fatigue: Secondary | ICD-10-CM | POA: Insufficient documentation

## 2023-11-02 DIAGNOSIS — I129 Hypertensive chronic kidney disease with stage 1 through stage 4 chronic kidney disease, or unspecified chronic kidney disease: Secondary | ICD-10-CM | POA: Insufficient documentation

## 2023-11-02 DIAGNOSIS — F319 Bipolar disorder, unspecified: Secondary | ICD-10-CM | POA: Diagnosis not present

## 2023-11-02 DIAGNOSIS — Z8673 Personal history of transient ischemic attack (TIA), and cerebral infarction without residual deficits: Secondary | ICD-10-CM | POA: Diagnosis not present

## 2023-11-02 DIAGNOSIS — R200 Anesthesia of skin: Secondary | ICD-10-CM | POA: Diagnosis not present

## 2023-11-02 DIAGNOSIS — K2980 Duodenitis without bleeding: Secondary | ICD-10-CM | POA: Diagnosis not present

## 2023-11-02 DIAGNOSIS — R0609 Other forms of dyspnea: Secondary | ICD-10-CM | POA: Insufficient documentation

## 2023-11-02 DIAGNOSIS — Z8379 Family history of other diseases of the digestive system: Secondary | ICD-10-CM | POA: Insufficient documentation

## 2023-11-02 DIAGNOSIS — R270 Ataxia, unspecified: Secondary | ICD-10-CM | POA: Diagnosis not present

## 2023-11-02 DIAGNOSIS — Z79899 Other long term (current) drug therapy: Secondary | ICD-10-CM | POA: Diagnosis not present

## 2023-11-02 DIAGNOSIS — K317 Polyp of stomach and duodenum: Secondary | ICD-10-CM | POA: Insufficient documentation

## 2023-11-02 DIAGNOSIS — E669 Obesity, unspecified: Secondary | ICD-10-CM | POA: Insufficient documentation

## 2023-11-02 DIAGNOSIS — Z881 Allergy status to other antibiotic agents status: Secondary | ICD-10-CM | POA: Insufficient documentation

## 2023-11-02 DIAGNOSIS — Z87891 Personal history of nicotine dependence: Secondary | ICD-10-CM | POA: Diagnosis not present

## 2023-11-02 DIAGNOSIS — Z8679 Personal history of other diseases of the circulatory system: Secondary | ICD-10-CM | POA: Diagnosis not present

## 2023-11-02 DIAGNOSIS — M3500 Sicca syndrome, unspecified: Secondary | ICD-10-CM | POA: Insufficient documentation

## 2023-11-02 DIAGNOSIS — Z825 Family history of asthma and other chronic lower respiratory diseases: Secondary | ICD-10-CM | POA: Insufficient documentation

## 2023-11-02 DIAGNOSIS — R059 Cough, unspecified: Secondary | ICD-10-CM | POA: Insufficient documentation

## 2023-11-02 DIAGNOSIS — Z8249 Family history of ischemic heart disease and other diseases of the circulatory system: Secondary | ICD-10-CM | POA: Insufficient documentation

## 2023-11-02 NOTE — Patient Instructions (Signed)
 [facility] at [facility] **VISIT SUMMARY & IMPORTANT INSTRUCTIONS **   You were seen today by Pleasant Barefoot PA-C for your iron  deficiency anemia.   Your blood levels look great, you are not anemic at this time.  However, your iron  levels are lower than they should be. You may have some small amounts of ongoing blood loss from your stomach and intestines. Will schedule you for IV iron  x 2 doses.  LABS: Return in 6 months for repeat labs  FOLLOW-UP APPOINTMENT: Phone visit in 3 months, after labs  ** Thank you for trusting me with your healthcare!  I strive to provide all of my patients with quality care at each visit.  If you receive a survey for this visit, I would be so grateful to you for taking the time to provide feedback.  Thank you in advance!  ~ Sharae Zappulla                   Dr. Alean Stands   &   Pleasant Barefoot, PA-C   - - - - - - - - - - - - - - - - - -    Thank you for choosing [facility] at [facility] to provide your oncology and hematology care.  To afford each patient quality time with our provider, please arrive at least 15 minutes before your scheduled appointment time.   If you have a lab appointment with the Cancer Center please come in thru the Main Entrance and check in at the main information desk.  You need to re-schedule your appointment should you arrive 10 or more minutes late.  We strive to give you quality time with our providers, and arriving late affects you and other patients whose appointments are after yours.  Also, if you no show three or more times for appointments you may be dismissed from the clinic at the providers discretion.     Again, thank you for choosing [facility].  Our hope is that these requests will decrease the amount of time that you wait before being seen by our physicians.       _____________________________________________________________  Should you have questions  after your visit to [facility], please contact our office at [phone] and follow the prompts.  Our office hours are 8:00 a.m. and 4:30 p.m. Monday - Friday.  Please note that voicemails left after 4:00 p.m. may not be returned until the following business day.  We are closed weekends and major holidays.  You do have access to a nurse 24-7, just call the main number to the clinic [phone] and do not press any options, hold on the line and a nurse will answer the phone.    For prescription refill requests, have your pharmacy contact our office and allow 72 hours.

## 2023-11-04 ENCOUNTER — Encounter: Payer: Self-pay | Admitting: Family Medicine

## 2023-11-04 ENCOUNTER — Ambulatory Visit: Admitting: Family Medicine

## 2023-11-04 VITALS — BP 118/77 | HR 83 | Temp 97.3°F | Ht 68.0 in | Wt 226.0 lb

## 2023-11-04 DIAGNOSIS — R270 Ataxia, unspecified: Secondary | ICD-10-CM | POA: Diagnosis not present

## 2023-11-04 DIAGNOSIS — K432 Incisional hernia without obstruction or gangrene: Secondary | ICD-10-CM | POA: Diagnosis not present

## 2023-11-04 NOTE — Progress Notes (Signed)
 Subjective:  Patient ID: Deanna  Thompson, female    DOB: [birth year]  Age: 67 y.o. MRN: [id number]  CC:   Chief Complaint  Patient presents with   Incisional Hernia    Increasing in size     Fall    Concern about recent falls. No injuries.     HPI:  68 year old female presents for evaluation of the above.  Patient reports that she is concerned that her hernia is worsening.  She has a known incisional hernia.  Seems to be larger in size.  She wants me to examine it today.    Patient also reports ongoing issues with gait disturbance/ataxia.  She states that she will often be walking and then stumbles.  She states that this is very odd.  She is not having any other neurological symptoms that she is aware of.  She states that this happens nearly daily.  She has had a prior stroke and has had prior aneurysm with aneurysm coiling.  Patient states that she cannot have an MRI due to this.   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   Chronic CHF (congestive heart failure) (HCC) 06/16/2022   Incisional hernia, without obstruction or gangrene 11/10/2021   Bipolar disorder (HCC) 01/13/2021   Hyperlipidemia 01/13/2021   IBS (irritable bowel syndrome)    History of stroke 08/01/2017   Essential hypertension 07/31/2017   GERD (gastroesophageal reflux disease) 07/31/2017   Brain aneurysm 07/31/2017   CKD (chronic kidney disease), stage III (HCC) 07/31/2017   LBBB (left bundle branch block) 07/31/2017   Psoriatic arthritis (HCC) 04/15/2015   Cirrhosis of liver (HCC) 09/19/2014   Iron  deficiency anemia due to chronic blood loss    Insomnia 03/17/2014    Social Hx   Social History   Socioeconomic History   Marital status: Married    Spouse name: Transport planner   Number of children: 0   Years of education: Not on file   Highest education level: Master's degree (e.g., MA, MS, MEng, MEd, MSW, MBA)  Occupational History    Employer: RETIRED  Tobacco Use    Smoking status: Former    Current packs/day: 0.00    Average packs/day: 2.0 packs/day for 12.0 years (24.0 ttl pk-yrs)    Types: Cigarettes    Start date: 03/31/1983    Quit date: 03/31/1995    Years since quitting: 28.6   Smokeless tobacco: Never   Tobacco comments:    quit in 1997  Vaping Use   Vaping status: Never Used  Substance and Sexual Activity   Alcohol  use: No    Comment: Bottle of wine a day for years, but quit in 07/2011   Drug use: No   Sexual activity: Not Currently    Birth control/protection: Post-menopausal  Other Topics Concern   Not on file  Social History Narrative   Lives   Caffeine use:    Quit drinking alcohol  3 YEARS ago. No drugs, no IVDU.    Social Drivers of Corporate investment banker Strain: Low Risk  (11/01/2023)   Overall Financial Resource Strain (CARDIA)    Difficulty of Paying Living Expenses: Not hard at all  Food Insecurity: No Food Insecurity (11/01/2023)   Hunger Vital Sign    Worried About Running Out of Food in the Last Year: Never true    Ran Out of Food in the Last Year: Never true  Transportation Needs: No Transportation Needs (11/01/2023)   PRAPARE - Transportation  Lack of Transportation (Medical): No    Lack of Transportation (Non-Medical): No  Physical Activity: Insufficiently Active (11/01/2023)   Exercise Vital Sign    Days of Exercise per Week: 1 day    Minutes of Exercise per Session: 20 min  Stress: Stress Concern Present (11/01/2023)   [employer] of Occupational Health - Occupational Stress Questionnaire    Feeling of Stress: To some extent  Social Connections: Moderately Isolated (11/01/2023)   Social Connection and Isolation Panel    Frequency of Communication with Friends and Family: Three times a week    Frequency of Social Gatherings with Friends and Family: Twice a week    Attends Religious Services: Never    Diplomatic Services operational officer: No    Attends Engineer, structural: Not on file     Marital Status: Married    Review of Systems Per HPI  Objective:  BP 118/77   Pulse 83   Temp (!) 97.3 F (36.3 C)   Ht 5' 8 (1.727 m)   Wt 226 lb (102.5 kg)   SpO2 97%   BMI 34.36 kg/m      11/04/2023   11:08 AM 11/02/2023    1:05 PM 09/26/2023    3:17 PM  BP/Weight  Systolic BP 118 125 127  Diastolic BP 77 75 80  Wt. (Lbs) 226 229   BMI 34.36 kg/m2 34.82 kg/m2     Physical Exam Vitals and nursing note reviewed.  Constitutional:      General: She is not in acute distress.    Appearance: Normal appearance.  HENT:     Head: Normocephalic and atraumatic.  Eyes:     General:        Right eye: No discharge.        Left eye: No discharge.     Conjunctiva/sclera: Conjunctivae normal.  Pulmonary:     Effort: Pulmonary effort is normal. No respiratory distress.  Abdominal:     Palpations: Abdomen is soft.     Tenderness: There is no abdominal tenderness.     Comments: Small hernia at incision of the upper abdomen.  Neurological:     Mental Status: She is alert. Mental status is at baseline.  Psychiatric:        Mood and Affect: Mood normal.        Behavior: Behavior normal.     Lab Results  Component Value Date   WBC 5.9 10/21/2023   HGB 14.9 10/21/2023   HCT 45.3 10/21/2023   PLT 279 10/21/2023   GLUCOSE 86 07/23/2023   CHOL 160 01/14/2021   TRIG 126 01/14/2021   HDL 79 01/14/2021   LDLCALC 59 01/14/2021   ALT 25 05/13/2023   AST 28 05/13/2023   NA 143 07/23/2023   K 3.9 07/23/2023   CL 103 07/23/2023   CREATININE 1.50 (H) 09/23/2023   BUN 13 07/23/2023   CO2 24 07/23/2023   TSH 8.256 (H) 08/25/2011   INR 1.2 (H) 05/13/2023   HGBA1C 5.8 (H) 08/01/2017     Assessment & Plan:  Incisional hernia, without obstruction or gangrene Assessment & Plan: Appears stable.  Advised watchful waiting/supportive care.   Ataxia Assessment & Plan: Patient endorsing ataxia.  This has been ongoing and is now occurring daily.  She has had prior stroke and has  had prior aneurysms with coiling.  Recommended CT for further evaluation.  Orders: -     CT HEAD WO CONTRAST ( )    Follow-up: Pending  results  Jacqulyn Ahle DO [facility]

## 2023-11-04 NOTE — Patient Instructions (Signed)
 We are arranging CT.  Keep an eye on the Hernia.  We will call with results

## 2023-11-04 NOTE — Assessment & Plan Note (Signed)
 Patient endorsing ataxia.  This has been ongoing and is now occurring daily.  She has had prior stroke and has had prior aneurysms with coiling.  Recommended CT for further evaluation.

## 2023-11-04 NOTE — Assessment & Plan Note (Signed)
 Appears stable.  Advised watchful waiting/supportive care.

## 2023-11-05 ENCOUNTER — Ambulatory Visit: Payer: Self-pay | Admitting: Family Medicine

## 2023-11-05 ENCOUNTER — Inpatient Hospital Stay

## 2023-11-05 ENCOUNTER — Ambulatory Visit (HOSPITAL_COMMUNITY)
Admission: RE | Admit: 2023-11-05 | Discharge: 2023-11-05 | Disposition: A | Discharge status: Home / Self Care | Source: Ambulatory Visit | Attending: Family Medicine | Admitting: Family Medicine

## 2023-11-05 VITALS — BP 120/76 | HR 65 | Temp 97.5°F | Resp 18

## 2023-11-05 DIAGNOSIS — D500 Iron deficiency anemia secondary to blood loss (chronic): Secondary | ICD-10-CM | POA: Diagnosis not present

## 2023-11-05 DIAGNOSIS — R270 Ataxia, unspecified: Secondary | ICD-10-CM | POA: Insufficient documentation

## 2023-11-05 MED ORDER — SODIUM CHLORIDE 0.9 % IV SOLN
400.0000 mg | Freq: Once | INTRAVENOUS | Status: AC
  Administered 2023-11-05: 400 mg via INTRAVENOUS
  Filled 2023-11-05: qty 400

## 2023-11-05 MED ORDER — SODIUM CHLORIDE 0.9 % IV SOLN: INTRAVENOUS | Status: DC

## 2023-11-05 NOTE — Progress Notes (Signed)
Venofer given today per MD orders. Tolerated infusion without adverse affects. Vital signs stable. No complaints at this time. Discharged from clinic ambulatory in stable condition. Alert and oriented x 3. F/U with [facility] as scheduled.

## 2023-11-05 NOTE — Patient Instructions (Signed)
 [facility] [place] - A DEPT OF . [facility]  Discharge Instructions: Thank you for choosing [facility] to provide your oncology and hematology care.  If you have a lab appointment with the Cancer Center - please note that after April 8th, 2024, all labs will be drawn in the cancer center.  You do not have to check in or register with the main entrance as you have in the past but will complete your check-in in the cancer center.  Wear comfortable clothing and clothing appropriate for easy access to any Portacath or PICC line.   We strive to give you quality time with your provider. You may need to reschedule your appointment if you arrive late (15 or more minutes).  Arriving late affects you and other patients whose appointments are after yours.  Also, if you miss three or more appointments without notifying the office, you may be dismissed from the clinic at the provider's discretion.      For prescription refill requests, have your pharmacy contact our office and allow 72 hours for refills to be completed.    Today you received the following chemotherapy and/or immunotherapy agents Venofer  400mg .Iron  Sucrose Injection What is this medication? IRON  SUCROSE (EYE ern SOO krose) treats low levels of iron  (iron  deficiency anemia) in people with kidney disease. Iron  is a mineral that plays an important role in making red blood cells, which carry oxygen from your lungs to the rest of your body. This medicine may be used for other purposes; ask your health care provider or pharmacist if you have questions. COMMON BRAND NAME(S): Venofer  What should I tell my care team before I take this medication? They need to know if you have any of these conditions: Anemia not caused by low iron  levels Heart disease High levels of iron  in the blood Kidney disease Liver disease An unusual or allergic reaction to iron , other medications, foods, dyes, or preservatives Pregnant  or trying to get pregnant Breastfeeding How should I use this medication? This medication is infused into a vein. It is given by your care team in a hospital or clinic setting. Talk to your care team about the use of this medication in children. While it may be prescribed for children as young as 2 years for selected conditions, precautions do apply. Overdosage: If you think you have taken too much of this medicine contact a poison control center or emergency room at once. NOTE: This medicine is only for you. Do not share this medicine with others. What if I miss a dose? Keep appointments for follow-up doses. It is important not to miss your dose. Call your care team if you are unable to keep an appointment. What may interact with this medication? Do not take this medication with any of the following: Deferoxamine Dimercaprol Other iron  products This medication may also interact with the following: Chloramphenicol Deferasirox This list may not describe all possible interactions. Give your health care provider a list of all the medicines, herbs, non-prescription drugs, or dietary supplements you use. Also tell them if you smoke, drink alcohol , or use illegal drugs. Some items may interact with your medicine. What should I watch for while using this medication? Visit your care team for regular checks on your progress. Tell your care team if your symptoms do not start to get better or if they get worse. You may need blood work done while you are taking this medication. You may need to eat  more foods that contain iron . Talk to your care team. Foods that contain iron  include whole grains or cereals, dried fruits, beans, peas, leafy green vegetables, and organ meats (liver, kidney). What side effects may I notice from receiving this medication? Side effects that you should report to your care team as soon as possible: Allergic reactions--skin rash, itching, hives, swelling of the face, lips,  tongue, or throat Low blood pressure--dizziness, feeling faint or lightheaded, blurry vision Shortness of breath Side effects that usually do not require medical attention (report to your care team if they continue or are bothersome): Flushing Headache Joint pain Muscle pain Nausea Pain, redness, or irritation at injection site This list may not describe all possible side effects. Call your doctor for medical advice about side effects. You may report side effects to FDA at 1-800-FDA-1088. Where should I keep my medication? This medication is given in a hospital or clinic. It will not be stored at home. NOTE: This sheet is a summary. It may not cover all possible information. If you have questions about this medicine, talk to your doctor, pharmacist, or health care provider.  2024 Elsevier/Gold Standard (2022-11-04 00:00:00)      To help prevent nausea and vomiting after your treatment, we encourage you to take your nausea medication as directed.  BELOW ARE SYMPTOMS THAT SHOULD BE REPORTED IMMEDIATELY: *FEVER GREATER THAN 100.4 F (38 C) OR HIGHER *CHILLS OR SWEATING *NAUSEA AND VOMITING THAT IS NOT CONTROLLED WITH YOUR NAUSEA MEDICATION *UNUSUAL SHORTNESS OF BREATH *UNUSUAL BRUISING OR BLEEDING *URINARY PROBLEMS (pain or burning when urinating, or frequent urination) *BOWEL PROBLEMS (unusual diarrhea, constipation, pain near the anus) TENDERNESS IN MOUTH AND THROAT WITH OR WITHOUT PRESENCE OF ULCERS (sore throat, sores in mouth, or a toothache) UNUSUAL RASH, SWELLING OR PAIN  UNUSUAL VAGINAL DISCHARGE OR ITCHING   Items with * indicate a potential emergency and should be followed up as soon as possible or go to the Emergency Department if any problems should occur.  Please show the CHEMOTHERAPY ALERT CARD or IMMUNOTHERAPY ALERT CARD at check-in to the Emergency Department and triage nurse.  Should you have questions after your visit or need to cancel or reschedule your appointment,  please contact [facility] [place] - A DEPT OF [facility] [phone]  and follow the prompts.  Office hours are 8:00 a.m. to 4:30 p.m. Monday - Friday. Please note that voicemails left after 4:00 p.m. may not be returned until the following business day.  We are closed weekends and major holidays. You have access to a nurse at all times for urgent questions. Please call the main number to the clinic [phone] and follow the prompts.  For any non-urgent questions, you may also contact your provider using MyChart. We now offer e-Visits for anyone 30 and older to request care online for non-urgent symptoms. For details visit [email or web address].   Also download the MyChart app! Go to the app store, search "MyChart", open the app, select [facility], and log in with your MyChart username and password.

## 2023-11-12 ENCOUNTER — Inpatient Hospital Stay

## 2023-11-12 VITALS — BP 110/68 | HR 71 | Temp 97.4°F | Resp 18

## 2023-11-12 DIAGNOSIS — D500 Iron deficiency anemia secondary to blood loss (chronic): Secondary | ICD-10-CM | POA: Diagnosis not present

## 2023-11-12 MED ORDER — SODIUM CHLORIDE 0.9 % IV SOLN: INTRAVENOUS | Status: DC

## 2023-11-12 MED ORDER — SODIUM CHLORIDE 0.9 % IV SOLN
400.0000 mg | Freq: Once | INTRAVENOUS | Status: AC
  Administered 2023-11-12: 400 mg via INTRAVENOUS
  Filled 2023-11-12: qty 400

## 2023-11-12 NOTE — Progress Notes (Signed)
Patient reports taking premedications at home. Patient tolerated iron infusion with no complaints voiced. Peripheral IV site clean and dry with good blood return noted before and after infusion. Band aid applied. VSS with discharge and left in satisfactory condition with no s/s of distress noted.

## 2023-11-12 NOTE — Patient Instructions (Signed)
 [facility] [place] - A DEPT OF [facility]  Discharge Instructions: Thank you for choosing [facility] to provide your oncology and hematology care.  If you have a lab appointment with the Cancer Center - please note that after April 8th, 2024, all labs will be drawn in the cancer center.  You do not have to check in or register with the main entrance as you have in the past but will complete your check-in in the cancer center.  Wear comfortable clothing and clothing appropriate for easy access to any Portacath or PICC line.   We strive to give you quality time with your provider. You may need to reschedule your appointment if you arrive late (15 or more minutes).  Arriving late affects you and other patients whose appointments are after yours.  Also, if you miss three or more appointments without notifying the office, you may be dismissed from the clinic at the provider's discretion.      For prescription refill requests, have your pharmacy contact our office and allow 72 hours for refills to be completed.    Today you received the following Venofer , return as scheduled.   To help prevent nausea and vomiting after your treatment, we encourage you to take your nausea medication as directed.  BELOW ARE SYMPTOMS THAT SHOULD BE REPORTED IMMEDIATELY: *FEVER GREATER THAN 100.4 F (38 C) OR HIGHER *CHILLS OR SWEATING *NAUSEA AND VOMITING THAT IS NOT CONTROLLED WITH YOUR NAUSEA MEDICATION *UNUSUAL SHORTNESS OF BREATH *UNUSUAL BRUISING OR BLEEDING *URINARY PROBLEMS (pain or burning when urinating, or frequent urination) *BOWEL PROBLEMS (unusual diarrhea, constipation, pain near the anus) TENDERNESS IN MOUTH AND THROAT WITH OR WITHOUT PRESENCE OF ULCERS (sore throat, sores in mouth, or a toothache) UNUSUAL RASH, SWELLING OR PAIN  UNUSUAL VAGINAL DISCHARGE OR ITCHING   Items with * indicate a potential emergency and should be followed up as soon as possible or  go to the Emergency Department if any problems should occur.  Please show the CHEMOTHERAPY ALERT CARD or IMMUNOTHERAPY ALERT CARD at check-in to the Emergency Department and triage nurse.  Should you have questions after your visit or need to cancel or reschedule your appointment, please contact [facility] [place] - A DEPT OF [facility] [phone]  and follow the prompts.  Office hours are 8:00 a.m. to 4:30 p.m. Monday - Friday. Please note that voicemails left after 4:00 p.m. may not be returned until the following business day.  We are closed weekends and major holidays. You have access to a nurse at all times for urgent questions. Please call the main number to the clinic [phone] and follow the prompts.  For any non-urgent questions, you may also contact your provider using MyChart. We now offer e-Visits for anyone 59 and older to request care online for non-urgent symptoms. For details visit [email or web address].   Also download the MyChart app! Go to the app store, search MyChart, open the app, select [place], and log in with your MyChart username and password.

## 2023-11-19 ENCOUNTER — Encounter: Payer: Self-pay | Admitting: Radiology

## 2023-11-22 ENCOUNTER — Ambulatory Visit (INDEPENDENT_AMBULATORY_CARE_PROVIDER_SITE_OTHER)

## 2023-11-22 ENCOUNTER — Ambulatory Visit: Admitting: Podiatry

## 2023-11-22 DIAGNOSIS — M722 Plantar fascial fibromatosis: Secondary | ICD-10-CM | POA: Diagnosis not present

## 2023-11-22 DIAGNOSIS — M2142 Flat foot [pes planus] (acquired), left foot: Secondary | ICD-10-CM | POA: Diagnosis not present

## 2023-11-22 DIAGNOSIS — M19072 Primary osteoarthritis, left ankle and foot: Secondary | ICD-10-CM | POA: Diagnosis not present

## 2023-11-22 DIAGNOSIS — M7750 Other enthesopathy of unspecified foot: Secondary | ICD-10-CM | POA: Diagnosis not present

## 2023-11-22 MED ORDER — TRIAMCINOLONE ACETONIDE 40 MG/ML IJ SUSP
20.0000 mg | Freq: Once | INTRAMUSCULAR | Status: AC
  Administered 2023-11-22: 20 mg

## 2023-11-22 MED ORDER — TRIAMCINOLONE ACETONIDE 40 MG/ML IJ SUSP: 20.0000 mg | Freq: Once | INTRAMUSCULAR | Status: AC

## 2023-11-22 MED ORDER — DEXAMETHASONE SODIUM PHOSPHATE 120 MG/30ML IJ SOLN
2.0000 mg | Freq: Once | INTRAMUSCULAR | Status: AC
  Administered 2023-11-22: 2 mg via INTRA_ARTICULAR

## 2023-11-22 NOTE — Progress Notes (Signed)
 Subjective:  Patient ID: Deanna  Thompson, female    DOB: 18-May-1955,  MRN: [id number] HPI Chief Complaint  Patient presents with   Flat Foot    Bilateral flat foot - had it for many years - left is painful in the ankle   Plantar Fasciitis    Heel pain right  - pain is the worst as she takes a step and pushes off the front of her foot    68 y.o. female presents with the above complaint.   ROS: Denies fever chills nausea mobic muscle aches pains calf pain back pain chest pain shortness of breath.  Past Medical History:  Diagnosis Date   Abdominal hernia    repaired   Abscess of pulp of tooth 12/12/2014   left side   Anemia    Anxiety    Arthritis    back  and down flank  right side more than left   B12 deficiency 06/12/2015   Brain aneurysm    METAL COIL PREVENTS MRI IMAGING and CLIP   Cirrhosis (HCC)    Presumably alcoholic cirrhosis   Cirrhosis (HCC)    Complication of anesthesia    Depression    ETOH abuse    Quit in 07/2011    Fatigue    due to Sjogren's disease   Flat foot (pes planus) (acquired), left foot    Folate deficiency 06/30/2016   GERD (gastroesophageal reflux disease)    HTN (hypertension)    no longer medicated, dated on 04/25/12   IBS (irritable bowel syndrome)    Iron  deficiency anemia due to chronic blood loss    Myofascial pain    Panic attack    PONV (postoperative nausea and vomiting)    Psoriatic arthritis (HCC)    Right inguinal hernia 08/16/2012   Seizures (HCC) 1997   1 seizure from coil insertion but no more seizures and no meds for seizures   Sjogren's disease (HCC)    soft Bipolar disorder (HCC)    Stroke (HCC) 2019   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   Qualifier: Diagnosis of  By: Margrette MD, [place]     TIA (transient ischemic attack)    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(HCC)    Vertigo    Past Surgical History:  Procedure Laterality Date   AGILE CAPSULE N/A 06/21/2014   Procedure: AGILE CAPSULE;  Surgeon: Margo LITTIE Haddock, MD;  Location: [facility];  Service: Endoscopy;  Laterality: N/A;  0700   BIOPSY  10/13/2011   SLF: mild gastritis/duodenal polypoid lesion    BIOPSY  06/04/2020   Procedure: BIOPSY;  Surgeon: Cindie Carlin POUR, DO;  Location: [facility];  Service: Endoscopy;;   CEREBRAL ANEURYSM REPAIR  1997   CHOLECYSTECTOMY N/A 10/28/2018   Procedure: LAPAROSCOPIC CHOLECYSTECTOMY WITH INTRAOPERATIVE CHOLANGIOGRAM;  Surgeon: Kallie Manuelita BROCKS, MD;  Location: [facility];  Service: General;  Laterality: N/A;   COLONOSCOPY  10/13/2011   DOQ:[id number] hemorrhoids/varices rectal/lesion in ascending colon(HYPERPLASTIC POLYP)   COLONOSCOPY N/A 01/17/2013   SLF: 1. Internal hemorrhoids 2. Ascending colon polyps 3.  Rectal varices.    COLONOSCOPY N/A 08/10/2016   Dr. Haddock: redundant left colon. external and internal hemorhroids. 5-10 year surveillance   ESOPHAGOGASTRODUODENOSCOPY N/A 06/29/2014   SLF: 1. anemia due to polypoid lesions in teh antrum, and duodenal polyps 2. single duodenum AVM   ESOPHAGOGASTRODUODENOSCOPY (EGD) WITH PROPOFOL  N/A 03/07/2019   Procedure: ESOPHAGOGASTRODUODENOSCOPY (EGD) WITH PROPOFOL ;  Surgeon: Haddock Margo LITTIE, MD;  [facility]: [facility];  Service: Endoscopy;  Laterality:  N/A;  1:00pm   ESOPHAGOGASTRODUODENOSCOPY (EGD) WITH PROPOFOL  N/A 03/16/2019   Procedure: ESOPHAGOGASTRODUODENOSCOPY (EGD) WITH PROPOFOL ;  Surgeon: Harvey Margo CROME, MD;  [facility]: [facility];  Service: Endoscopy;  Laterality: N/A;   ESOPHAGOGASTRODUODENOSCOPY (EGD) WITH PROPOFOL  N/A 06/04/2020   Procedure: ESOPHAGOGASTRODUODENOSCOPY (EGD) WITH PROPOFOL ;  Surgeon: Cindie Carlin POUR, DO;  [facility];  Service: Endoscopy;  Laterality: N/A;  am   FLEXIBLE SIGMOIDOSCOPY N/A 01/21/2016   Procedure: FLEXIBLE SIGMOIDOSCOPY;  Surgeon: Margo CROME Harvey, MD;  [facility]: [facility];  Service: Endoscopy;  Laterality: N/A;  245   GIVENS CAPSULE STUDY N/A 06/29/2014   Procedure: GIVENS CAPSULE STUDY;  Surgeon: Margo CROME Harvey, MD;  [facility]: [facility];  Service: Endoscopy;  Laterality: N/A;   GIVENS CAPSULE STUDY N/A 07/13/2014   Procedure: GIVENS CAPSULE STUDY;  Surgeon: Margo CROME Harvey, MD;  [facility]: [facility];  Service: Endoscopy;  Laterality: N/A;  700   HEMORRHOID BANDING N/A 01/21/2016   Procedure: HEMORRHOID BANDING;  Surgeon: Margo CROME Harvey, MD;  [facility]: [facility];  Service: Endoscopy;  Laterality: N/A;   HEMOSTASIS CLIP PLACEMENT  03/16/2019   Procedure: HEMOSTASIS CLIP PLACEMENT;  Surgeon: Harvey Margo CROME, MD;  [facility];  Service: Endoscopy;;  x 4   HEMOSTASIS CONTROL  03/16/2019   Procedure: HEMOSTASIS CONTROL;  Surgeon: Harvey Margo CROME, MD;  [facility]: [facility];  Service: Endoscopy;;  injection with epinephine 0.1mg /ml via scope and bipolar cautery with gold probe   INGUINAL HERNIA REPAIR Right 08/28/2013   Procedure: RIGHT NGUINAL HERNIORRHAPHY WITH MESH;  Surgeon: Oneil DELENA Budge, MD;  Location: [facility];  Service: General;  Laterality: Right;   INSERTION OF MESH N/A 06/09/2013   Procedure: INSERTION OF MESH;  Surgeon: Oneil DELENA Budge, MD;  Location: [facility];  Service: General;  Laterality: N/A;   INSERTION OF MESH Right 08/28/2013   Procedure: INSERTION OF MESH;  Surgeon: Oneil DELENA Budge, MD;  Location: [facility];  Service: General;  Laterality: Right;   IR RADIOLOGIST EVAL & MGMT  02/17/2023   PARACENTESIS  Feb 2014   10.7 liters   RIGHT/LEFT HEART CATH AND CORONARY ANGIOGRAPHY N/A 04/22/2023   Procedure: RIGHT/LEFT HEART CATH AND CORONARY ANGIOGRAPHY;  Surgeon: Ladona Heinz, MD;  [facility];  Service: Cardiovascular;  Laterality: N/A;   TOOTH EXTRACTION Left 12/12/14   UMBILICAL HERNIA REPAIR N/A 06/09/2013   Procedure: UMBILICAL HERNIORRHAPHY WITH MESH;  Surgeon: Oneil DELENA Budge, MD;  Location: [facility];  Service: General;  Laterality: N/A;    Current Outpatient Medications:    acetaminophen  (TYLENOL ) 500 MG tablet, Take 500 mg by mouth every 6 (six) hours  as needed for moderate pain (pain score 4-6)., Disp: , Rfl:    atorvastatin  (LIPITOR ) 40 MG tablet, TAKE ONE TABLET BY MOUTH ONCE DAILY., Disp: 90 tablet, Rfl: 1   bumetanide  (BUMEX ) 1 MG tablet, TAKE (1) TABLET BY MOUTH TWICE DAILY., Disp: 180 tablet, Rfl: 1   buPROPion  (WELLBUTRIN  XL) 300 MG 24 hr tablet, Take 300 mg by mouth daily., Disp: , Rfl:    Camphor-Menthol -Methyl Sal (SALONPAS) 3.04-04-08 % PTCH, Place 1 patch onto the skin daily as needed (pain.)., Disp: , Rfl:    cholecalciferol  (VITAMIN D3) 25 MCG (1000 UT) tablet, Take 2 tablets (2,000 Units total) by mouth daily., Disp: 30 tablet, Rfl: 11   dapagliflozin  propanediol (FARXIGA ) 10 MG TABS tablet, Take 1 tablet (10 mg total) by mouth daily., Disp: 30 tablet, Rfl:  5   DULoxetine (CYMBALTA) 60 MG capsule, Take 60 mg by mouth daily., Disp: , Rfl:    famotidine  (PEPCID ) 20 MG tablet, Take 1 tablet (20 mg total) by mouth 2 (two) times daily before a meal., Disp: 60 tablet, Rfl: 5   fluticasone  (FLONASE ) 50 MCG/ACT nasal spray, Place 1 spray into both nostrils See admin instructions. Instill 1 spray into each nostril every morning, may also use later in the day as needed for sinus congestion, Disp: , Rfl:    HYDROcodone -acetaminophen  (NORCO/VICODIN) 5-325 MG tablet, Take 1 tablet by mouth every 6 (six) hours as needed., Disp: , Rfl:    lamoTRIgine (LAMICTAL) 200 MG tablet, Take 400 mg by mouth daily., Disp: , Rfl:    LINZESS  145 MCG CAPS capsule, TAKE (1) CAPSULE BY MOUTH EVERY DAY BEFORE BREAKFAST., Disp: 90 capsule, Rfl: 1   loratadine  (CLARITIN ) 10 MG tablet, Take 10 mg by mouth daily as needed for allergies., Disp: , Rfl:    phenazopyridine  (PYRIDIUM ) 200 MG tablet, Take 1 tablet (200 mg total) by mouth 3 (three) times daily., Disp: 6 tablet, Rfl: 0   Polyvinyl Alcohol -Povidone (REFRESH OP), Place 1 drop into both eyes 3 (three) times daily as needed (dry eyes)., Disp: , Rfl:    Simethicone  (GAS-X PO), Take 1 tablet by mouth 2 (two) times  daily as needed (bloating/flatulence)., Disp: , Rfl:    sodium chloride  (OCEAN) 0.65 % nasal spray, Place 1 spray into the nose 2 (two) times daily as needed for congestion., Disp: , Rfl:    spironolactone  (ALDACTONE ) 100 MG tablet, TAKE 1/2 TABLET BY MOUTH TWICE DAILY., Disp: 30 tablet, Rfl: 4   Tofacitinib  Citrate ER (XELJANZ  XR) 11 MG TB24, Take 1 tablet by mouth daily., Disp: , Rfl:    XIIDRA  5 % SOLN, Apply 1 drop to eye 2 (two) times daily., Disp: , Rfl:   Current Facility-Administered Medications:    triamcinolone  acetonide (KENALOG -40) injection 20 mg, 20 mg, Other, Once,   Allergies  Allergen Reactions   Keflex [Cephalexin] Other (See Comments)    Severe yeast infection   Omeprazole Shortness Of Breath   Orencia [Abatacept] Anaphylaxis, Hives and Rash   Other Shortness Of Breath, Rash and Other (See Comments)    Red Meat Causes shortness of breath, rash and flu like symptoms.   Alpha Gal   Phenytoin Rash    Dilantin   Lidocaine  Viscous Hcl Nausea And Vomiting    NAUSEA/PROLONGED NUMBNESS   Promethazine  Hcl Other (See Comments)    Not in right state of mind, HALLUCINATION Other reaction(s): mental confusion   Review of Systems Objective:  There were no vitals filed for this visit.  General: Well developed, nourished, in no acute distress, alert and oriented x3   Dermatological: Skin is warm, dry and supple bilateral. Nails x 10 are well maintained; remaining integument appears unremarkable at this time. There are no open sores, no preulcerative lesions, no rash or signs of infection present.  Vascular: Dorsalis Pedis artery and Posterior Tibial artery pedal pulses are 2/4 bilateral with immedate capillary fill time. Pedal hair growth present. No varicosities and no lower extremity edema present bilateral.   Neruologic: Grossly intact via light touch bilateral. Vibratory intact via tuning fork bilateral. Protective threshold with Semmes Wienstein monofilament intact to all  pedal sites bilateral. Patellar and Achilles deep tendon reflexes 2+ bilateral. No Babinski or clonus noted bilateral.   Musculoskeletal: No gross boney pedal deformities bilateral. No pain, crepitus, or limitation noted with foot  and ankle range of motion bilateral. Muscular strength 5/5 in all groups tested bilateral.  Gait: Unassisted, Nonantalgic.    Radiographs:  Radiographs taken today demonstrate an osseously mature individual with severe flatfoot deformity and severe osteopenia.  Significant psoriatic arthritic changes distal tibia ankle and phalanges.  Significant osteoarthritic arthritic changes to the midfoot talonavicular joint right and near complete dislocation of talonavicular joint left severe pes planus bilateral.  She has some soft tissue swelling on the posterior aspect of the right heel but no increase in size of the Achilles.  Assessment & Plan:   Assessment: Insertional Achilles tendinitis with bursitis right.  Severe flatfoot deformity bilateral.  Subtalar joint capsulitis left foot.  Osteoarthritis and psoriatic arthritis bilateral foot.  Plan: I injected the subtalar joint today left after sterile Betadine  skin prep 20 mg Kenalog  5 mg Marcaine .  I injected dexamethasone  2 mg to the posterior aspect of the Achilles making sure not to inject into the Achilles but rather into the bursa.  There is just lateral to midline.  Discussed appropriate shoe gear and we will follow-up with her in about 3 months.     Bernadette Armijo T. [place], [place]

## 2023-11-23 ENCOUNTER — Ambulatory Visit: Admitting: Nurse Practitioner

## 2023-11-24 ENCOUNTER — Other Ambulatory Visit: Payer: Self-pay | Admitting: Nurse Practitioner

## 2023-11-25 MED ORDER — DAPAGLIFLOZIN PROPANEDIOL 10 MG PO TABS: 10.0000 mg | ORAL_TABLET | Freq: Every day | ORAL | 2 refills | Status: DC

## 2023-12-10 ENCOUNTER — Ambulatory Visit: Payer: Self-pay

## 2023-12-10 ENCOUNTER — Ambulatory Visit
Admission: EM | Admit: 2023-12-10 | Discharge: 2023-12-10 | Disposition: A | Discharge status: Home / Self Care | Attending: Nurse Practitioner | Admitting: Nurse Practitioner

## 2023-12-10 DIAGNOSIS — R399 Unspecified symptoms and signs involving the genitourinary system: Secondary | ICD-10-CM | POA: Diagnosis present

## 2023-12-10 LAB — POCT URINE DIPSTICK
Glucose, UA: 500 mg/dL — AB
Ketones, POC UA: NEGATIVE mg/dL
Nitrite, UA: NEGATIVE
POC PROTEIN,UA: 30 — AB
Spec Grav, UA: >=1.03 — AB (ref 1.010–1.025)
Urobilinogen, UA: 0.2 U/dL
pH, UA: 6 (ref 5.0–8.0)

## 2023-12-10 MED ORDER — PHENAZOPYRIDINE HCL 100 MG PO TABS
100.0000 mg | ORAL_TABLET | Freq: Three times a day (TID) | ORAL | 0 refills | Status: DC | PRN
Start: 2023-12-10 — End: 2024-02-19

## 2023-12-10 MED ORDER — CIPROFLOXACIN HCL 500 MG PO TABS: 500.0000 mg | ORAL_TABLET | Freq: Two times a day (BID) | ORAL | 0 refills | Status: AC

## 2023-12-10 NOTE — ED Notes (Signed)
 Pt attempting to provide urine sample for culture but has been unsuccessful. Provider aware.

## 2023-12-10 NOTE — ED Triage Notes (Signed)
 Patient presenting with burning urination, groin pain/burning, cold sweats, urinary urgency, and cloudy urine onset this morning and getting worse. Denies any current fever.   Patient states she started Farxiga  a few months ago and this would be her second UTI since.

## 2023-12-10 NOTE — Telephone Encounter (Signed)
 FYI Only or Action Required?: FYI only for provider.  Patient was last seen in primary care on 11/04/2023 by Cook, Jayce G, DO.  Called Nurse Triage reporting Dysuria and Urinary Frequency.  Symptoms began today.  Interventions attempted: Rest, hydration, or home remedies.  Symptoms are: gradually worsening.  Triage Disposition: See HCP Within 4 Hours (Or PCP Triage)  Patient/caregiver understands and will follow disposition?: Yes     Copied from CRM #[id number]. Topic: Clinical - Red Word Triage >> Dec 10, 2023 11:43 AM Tinnie BROCKS wrote: Red Word that prompted transfer to Nurse Triage: UTI symptoms Reason for Disposition  [1] SEVERE pain with urination (e.g., excruciating) AND [2] not improved after 2 hours of pain medicine  Answer Assessment - Initial Assessment Questions 1. SEVERITY: How bad is the pain?  (e.g., Scale 1-10; mild, moderate, or severe)     7-8/10 2. FREQUENCY: How many times have you had painful urination today?      3x 3. PATTERN: Is pain present every time you urinate or just sometimes?      Every time pretty painful 4. ONSET: When did the painful urination start?      Started this morning 5. FEVER: Do you have a fever? If Yes, ask: What is your temperature, how was it measured, and when did it start?     Not checked but having cold sweat and chills 6. PAST UTI: Have you had a urine infection before? If Yes, ask: When was the last time? and What happened that time?      Get a lot 7. CAUSE: What do you think is causing the painful urination?  (e.g., UTI, scratch, Herpes sore)     UTI 8. OTHER SYMPTOMS: Do you have any other symptoms? (e.g., blood in urine, flank pain, genital sores, urgency, vaginal discharge)     Not seeing discoloration with urination,  Feeling the urge but less of a stream Still getting a weak stream, had full stream this morning Burning started 3 hours ago Confirms getting more than a couple droplets each  time   Advised exam in next 4 hours, no PCP or region availability, pt states she will go to [facility]. Advised call back if any worsening, advised ED if feeling full bladder but no urine or only couple drops for over 4 hours.  Protocols used: Urination Pain - Female-A-[facility]

## 2023-12-10 NOTE — ED Provider Notes (Addendum)
 [facility]    CSN: [id number] Arrival date & time: 12/10/23  1210      History   Chief Complaint Chief Complaint  Patient presents with   Dysuria    HPI Deanna  Thompson is a 68 y.o. female.   The history is provided by the patient.   Patient presents for complaints of chills, burning with urination, urinary urgency, cloudy urine, and suprapubic pressure that started this morning.  Patient denies fever, abdominal pain, nausea, vomiting, hematuria, decreased urine stream, flank pain, low back pain, or vaginal symptoms.  Patient states her last UTI was approximately 2 to 3 months ago, states that she has been taking Farxiga , she has had the increased frequency of UTIs.  Patient denies prior history of recurrent UTIs.  Past Medical History:  Diagnosis Date   Abdominal hernia    repaired   Abscess of pulp of tooth 12/12/2014   left side   Anemia    Anxiety    Arthritis    back  and down flank  right side more than left   B12 deficiency 06/12/2015   Brain aneurysm    METAL COIL PREVENTS MRI IMAGING and CLIP   Cirrhosis (HCC)    Presumably alcoholic cirrhosis   Cirrhosis (HCC)    Complication of anesthesia    Depression    ETOH abuse    Quit in 07/2011    Fatigue    due to Sjogren's disease   Flat foot (pes planus) (acquired), left foot    Folate deficiency 06/30/2016   GERD (gastroesophageal reflux disease)    HTN (hypertension)    no longer medicated, dated on 04/25/12   IBS (irritable bowel syndrome)    Iron  deficiency anemia due to chronic blood loss    Myofascial pain    Panic attack    PONV (postoperative nausea and vomiting)    Psoriatic arthritis (HCC)    Right inguinal hernia 08/16/2012   Seizures (HCC) 1997   1 seizure from coil insertion but no more seizures and no meds for seizures   Sjogren's disease (HCC)    soft Bipolar disorder (HCC)    Stroke (HCC) 2019   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   Qualifier: Diagnosis of  By: Margrette MD,  [place]     TIA (transient ischemic attack)    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(HCC)    Vertigo     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   Chronic CHF (congestive heart failure) (HCC) 06/16/2022   Incisional hernia, without obstruction or gangrene 11/10/2021   Bipolar disorder (HCC) 01/13/2021   Hyperlipidemia 01/13/2021   IBS (irritable bowel syndrome)    History of stroke 08/01/2017   Essential hypertension 07/31/2017   GERD (gastroesophageal reflux disease) 07/31/2017   Brain aneurysm 07/31/2017   CKD (chronic kidney disease), stage III (HCC) 07/31/2017   LBBB (left bundle branch block) 07/31/2017   Psoriatic arthritis (HCC) 04/15/2015   Cirrhosis of liver (HCC) 09/19/2014   Iron  deficiency anemia due to chronic blood loss    Insomnia 03/17/2014    Past Surgical History:  Procedure Laterality Date   AGILE CAPSULE N/A 06/21/2014   Procedure: AGILE CAPSULE;  Surgeon: Margo LITTIE Haddock, MD;  [facility];  Service: Endoscopy;  Laterality: N/A;  0700   BIOPSY  10/13/2011   SLF: mild gastritis/duodenal polypoid lesion    BIOPSY  06/04/2020   Procedure: BIOPSY;  Surgeon: Cindie Carlin POUR, DO;  [facility];  Service: Endoscopy;;   CEREBRAL ANEURYSM REPAIR  1997   CHOLECYSTECTOMY N/A 10/28/2018   Procedure: LAPAROSCOPIC CHOLECYSTECTOMY WITH INTRAOPERATIVE CHOLANGIOGRAM;  Surgeon: Kallie Manuelita BROCKS, MD;  Location: [facility];  Service: General;  Laterality: N/A;   COLONOSCOPY  10/13/2011   DOQ:pwuzmwjo hemorrhoids/varices rectal/lesion in ascending colon(HYPERPLASTIC POLYP)   COLONOSCOPY N/A 01/17/2013   SLF: 1. Internal hemorrhoids 2. Ascending colon polyps 3.  Rectal varices.    COLONOSCOPY N/A 08/10/2016   Dr. Harvey: redundant left colon. external and internal hemorhroids. 5-10 year surveillance   ESOPHAGOGASTRODUODENOSCOPY N/A 06/29/2014   SLF: 1. anemia due to polypoid lesions in teh antrum, and duodenal polyps 2.  single duodenum AVM   ESOPHAGOGASTRODUODENOSCOPY (EGD) WITH PROPOFOL  N/A 03/07/2019   Procedure: ESOPHAGOGASTRODUODENOSCOPY (EGD) WITH PROPOFOL ;  Surgeon: Harvey Margo CROME, MD;  [facility];  Service: Endoscopy;  Laterality: N/A;  1:00pm   ESOPHAGOGASTRODUODENOSCOPY (EGD) WITH PROPOFOL  N/A 03/16/2019   Procedure: ESOPHAGOGASTRODUODENOSCOPY (EGD) WITH PROPOFOL ;  Surgeon: Harvey Margo CROME, MD;  [facility];  Service: Endoscopy;  Laterality: N/A;   ESOPHAGOGASTRODUODENOSCOPY (EGD) WITH PROPOFOL  N/A 06/04/2020   Procedure: ESOPHAGOGASTRODUODENOSCOPY (EGD) WITH PROPOFOL ;  Surgeon: Cindie Carlin POUR, DO;  [facility];  Service: Endoscopy;  Laterality: N/A;  am   FLEXIBLE SIGMOIDOSCOPY N/A 01/21/2016   Procedure: FLEXIBLE SIGMOIDOSCOPY;  Surgeon: Margo CROME Harvey, MD;  [facility];  Service: Endoscopy;  Laterality: N/A;  245   GIVENS CAPSULE STUDY N/A 06/29/2014   Procedure: GIVENS CAPSULE STUDY;  Surgeon: Margo CROME Harvey, MD;  [facility];  Service: Endoscopy;  Laterality: N/A;   GIVENS CAPSULE STUDY N/A 07/13/2014   Procedure: GIVENS CAPSULE STUDY;  Surgeon: Margo CROME Harvey, MD;  [facility];  Service: Endoscopy;  Laterality: N/A;  700   HEMORRHOID BANDING N/A 01/21/2016   Procedure: HEMORRHOID BANDING;  Surgeon: Margo CROME Harvey, MD;  [facility];  Service: Endoscopy;  Laterality: N/A;   HEMOSTASIS CLIP PLACEMENT  03/16/2019   Procedure: HEMOSTASIS CLIP PLACEMENT;  Surgeon: Harvey Margo CROME, MD;  [facility];  Service: Endoscopy;;  x 4   HEMOSTASIS CONTROL  03/16/2019   Procedure: HEMOSTASIS CONTROL;  Surgeon: Harvey Margo CROME, MD;  [facility];  Service: Endoscopy;;  injection with epinephine 0.1mg /ml via scope and bipolar cautery with gold probe   INGUINAL HERNIA REPAIR Right 08/28/2013   Procedure: RIGHT NGUINAL HERNIORRHAPHY WITH MESH;  Surgeon: Oneil DELENA Budge, MD;  Location: [facility];  Service: General;   Laterality: Right;   INSERTION OF MESH N/A 06/09/2013   Procedure: INSERTION OF MESH;  Surgeon: Oneil DELENA Budge, MD;  Location: [facility];  Service: General;  Laterality: N/A;   INSERTION OF MESH Right 08/28/2013   Procedure: INSERTION OF MESH;  Surgeon: Oneil DELENA Budge, MD;  Location: [facility];  Service: General;  Laterality: Right;   IR RADIOLOGIST EVAL & MGMT  02/17/2023   PARACENTESIS  Feb 2014   10.7 liters   RIGHT/LEFT HEART CATH AND CORONARY ANGIOGRAPHY N/A 04/22/2023   Procedure: RIGHT/LEFT HEART CATH AND CORONARY ANGIOGRAPHY;  Surgeon: Ladona Heinz, MD;  [facility];  Service: Cardiovascular;  Laterality: N/A;   TOOTH EXTRACTION Left 12/12/14   UMBILICAL HERNIA REPAIR N/A 06/09/2013   Procedure: UMBILICAL HERNIORRHAPHY WITH MESH;  Surgeon: Oneil DELENA Budge, MD;  Location: [facility];  Service: General;  Laterality: N/A;    OB History     Gravida  0  Para  0   Term  0   Preterm  0   AB  0   Living  0      SAB  0   IAB  0   Ectopic  0   Multiple  0   Live Births  0            Home Medications    Prior to Admission medications   Medication Sig Start Date End Date Taking? Authorizing Provider  acetaminophen  (TYLENOL ) 500 MG tablet Take 500 mg by mouth every 6 (six) hours as needed for moderate pain (pain score 4-6).   Yes [provider]  atorvastatin  (LIPITOR ) 40 MG tablet TAKE ONE TABLET BY MOUTH ONCE DAILY. 06/09/23  Yes Cook, Jayce G, DO  bumetanide  (BUMEX ) 1 MG tablet TAKE (1) TABLET BY MOUTH TWICE DAILY. 10/21/23  Yes Cook, Jayce G, DO  buPROPion  (WELLBUTRIN  XL) 300 MG 24 hr tablet Take 300 mg by mouth daily. 04/25/22  Yes [provider]  Camphor-Menthol -Methyl Sal (SALONPAS) 3.04-04-08 % PTCH Place 1 patch onto the skin daily as needed (pain.).   Yes [provider]  cholecalciferol  (VITAMIN D3) 25 MCG (1000 UT) tablet Take 2 tablets (2,000 Units total) by mouth daily. 12/14/18  Yes Fields, Margo CROME, MD  ciprofloxacin  (CIPRO ) 500  MG tablet Take 1 tablet (500 mg total) by mouth 2 (two) times daily for 7 days. 12/10/23 12/17/23 Yes Leath-Warren, Etta PARAS, NP  dapagliflozin  propanediol (FARXIGA ) 10 MG TABS tablet Take 1 tablet (10 mg total) by mouth daily. 11/25/23  Yes BranchDorn FALCON, MD  DULoxetine (CYMBALTA) 60 MG capsule Take 60 mg by mouth daily.   Yes [provider]  famotidine  (PEPCID ) 20 MG tablet Take 1 tablet (20 mg total) by mouth 2 (two) times daily before a meal. 10/17/19  Yes Rudy, Kristen S, PA-C  fluticasone  (FLONASE ) 50 MCG/ACT nasal spray Place 1 spray into both nostrils See admin instructions. Instill 1 spray into each nostril every morning, may also use later in the day as needed for sinus congestion   Yes [provider]  lamoTRIgine (LAMICTAL) 200 MG tablet Take 400 mg by mouth daily. 02/06/21  Yes [provider]  LINZESS  145 MCG CAPS capsule TAKE (1) CAPSULE BY MOUTH EVERY DAY BEFORE BREAKFAST. 06/20/23  Yes Federico Rosario BROCKS, MD  loratadine  (CLARITIN ) 10 MG tablet Take 10 mg by mouth daily as needed for allergies.   Yes [provider]  phenazopyridine  (PYRIDIUM ) 200 MG tablet Take 1 tablet (200 mg total) by mouth 3 (three) times daily. 09/26/23  Yes Stuart Vernell Norris, PA-C  Polyvinyl Alcohol -Povidone (REFRESH OP) Place 1 drop into both eyes 3 (three) times daily as needed (dry eyes).   Yes [provider]  Simethicone  (GAS-X PO) Take 1 tablet by mouth 2 (two) times daily as needed (bloating/flatulence).   Yes [provider]  sodium chloride  (OCEAN) 0.65 % nasal spray Place 1 spray into the nose 2 (two) times daily as needed for congestion.   Yes [provider]  spironolactone  (ALDACTONE ) 100 MG tablet TAKE 1/2 TABLET BY MOUTH TWICE DAILY. 07/06/23  Yes Cook, Jayce G, DO  Tofacitinib  Citrate ER (XELJANZ  XR) 11 MG TB24 Take 1 tablet by mouth daily.   Yes [provider]  XIIDRA  5 % SOLN Apply 1 drop to eye 2 (two) times daily.  09/08/21  Yes [provider]  HYDROcodone -acetaminophen  (NORCO/VICODIN) 5-325 MG tablet Take 1 tablet by mouth every 6 (six)  hours as needed. 05/18/23   [provider]    Family History Family History  Problem Relation Age of Onset   Cirrhosis Mother        nash   COPD Father    Heart disease Father    Asthma Other    Heart disease Other    Colon cancer Neg Hx    Inflammatory bowel disease Neg Hx    Stomach cancer Neg Hx    Esophageal cancer Neg Hx     Social History Social History   Tobacco Use   Smoking status: Former    Current packs/day: 0.00    Average packs/day: 2.0 packs/day for 12.0 years (24.0 ttl pk-yrs)    Types: Cigarettes    Start date: 03/31/1983    Quit date: 03/31/1995    Years since quitting: 28.7   Smokeless tobacco: Never   Tobacco comments:    quit in 1997  Vaping Use   Vaping status: Never Used  Substance Use Topics   Alcohol  use: No    Comment: Bottle of wine a day for years, but quit in 07/2011   Drug use: No     Allergies   Keflex [cephalexin], Omeprazole, Orencia [abatacept], Other, Phenytoin, Lidocaine  viscous hcl, and Promethazine  hcl   Review of Systems Review of Systems Per HPI  Physical Exam Triage Vital Signs ED Triage Vitals  Encounter Vitals Group     BP 12/10/23 1353 125/80     Girls Systolic BP Percentile --      Girls Diastolic BP Percentile --      Boys Systolic BP Percentile --      Boys Diastolic BP Percentile --      Pulse Rate 12/10/23 1353 68     Resp 12/10/23 1353 18     Temp 12/10/23 1353 97.7 F (36.5 C)     Temp Source 12/10/23 1353 Oral     SpO2 12/10/23 1353 98 %     Weight 12/10/23 1353 225 lb (102.1 kg)     Height 12/10/23 1353 5' 8 (1.727 m)     Head Circumference --      Peak Flow --      Pain Score 12/10/23 1352 10     Pain Loc --      Pain Education --      Exclude from Growth Chart --    No data found.  Updated Vital Signs BP 125/80 (BP Location: Left Arm)   Pulse 68    Temp 97.7 F (36.5 C) (Oral)   Resp 18   Ht 5' 8 (1.727 m)   Wt 225 lb (102.1 kg)   SpO2 98%   BMI 34.21 kg/m   Visual Acuity Right Eye Distance:   Left Eye Distance:   Bilateral Distance:    Right Eye Near:   Left Eye Near:    Bilateral Near:     Physical Exam Vitals and nursing note reviewed.  HENT:     Head: Normocephalic.  Eyes:     Extraocular Movements: Extraocular movements intact.     Pupils: Pupils are equal, round, and reactive to light.  Cardiovascular:     Rate and Rhythm: Regular rhythm.     Pulses: Normal pulses.     Heart sounds: Normal heart sounds.  Pulmonary:     Effort: Pulmonary effort is normal. No respiratory distress.     Breath sounds: Normal breath sounds. No stridor. No wheezing, rhonchi or rales.  Abdominal:  General: Bowel sounds are normal.     Palpations: Abdomen is soft.     Tenderness: There is abdominal tenderness in the suprapubic area.  Musculoskeletal:     Cervical back: Normal range of motion.  Skin:    General: Skin is warm and dry.  Neurological:     General: No focal deficit present.     Mental Status: She is alert and oriented to person, place, and time.  Psychiatric:        Mood and Affect: Mood normal.        Behavior: Behavior normal.      UC Treatments / Results  Labs (all labs ordered are listed, but only abnormal results are displayed) Labs Reviewed  POCT URINE DIPSTICK - Abnormal; Notable for the following components:      Result Value   Clarity, UA cloudy (*)    Glucose, UA =500 (*)    Bilirubin, UA small (*)    Spec Grav, UA >=1.030 (*)    Blood, UA large (*)    POC PROTEIN,UA =30 (*)    Leukocytes, UA Trace (*)    All other components within normal limits    EKG   Radiology No results found.  Procedures Procedures (including critical care time)  Medications Ordered in UC Medications - No data to display  Initial Impression / Assessment and Plan / UC Course  I have reviewed the triage  vital signs and the nursing notes.  Pertinent labs & imaging results that were available during my care of the patient were reviewed by me and considered in my medical decision making (see chart for details).  Urinalysis is positive for protein, blood, and trace leukocytes, suggestive of a UTI.  Urine culture is pending.  In the interim, we will treat empirically for acute cystitis with Cipro  500 mg. Pyridium  100mg  for dysuria.  Per review of chart patient with underlying history of kidney disease, last GFR was around 38, she also has an allergy to Keflex.  Supportive care recommendations were provided and discussed with the patient to include fluids, over-the-counter analgesics, developing a toileting schedule, and avoiding caffeine.  Patient was advised that if the culture result is negative and she is continue to experience symptoms, recommend follow-up with her PCP for further evaluation.  Patient was in agreement with this plan of care and verbalizes understanding.  All questions were answered.  Patient stable for discharge.   Final Clinical Impressions(s) / UC Diagnoses   Final diagnoses:  UTI symptoms     Discharge Instructions      A urine culture has been ordered.  You will be contacted when the results of the urine culture are received.  If the results of your urine culture are negative and you are continue to experience symptoms, recommend follow-up with your primary care physician for further evaluation. Take medication as prescribed. Increase fluids and allow for plenty of rest.  Try to drink at least 8-10 8 ounce glasses of water  while symptoms persist. You may take over-the-counter Tylenol  as needed for pain or discomfort. Develop a toileting schedule that will allow you to urinate at least every 2 hours. Avoid caffeine such as tea, soda, or coffee while symptoms persist. Follow-up as needed.       ED Prescriptions     Medication Sig Dispense Auth. Provider    ciprofloxacin  (CIPRO ) 500 MG tablet Take 1 tablet (500 mg total) by mouth 2 (two) times daily for 7 days. 14 tablet Leath-Warren, Etta PARAS,  NP      PDMP not reviewed this encounter.   Gilmer Etta PARAS, NP 12/10/23 1428    Leath-Warren, Etta PARAS, NP 12/10/23 [phone]

## 2023-12-10 NOTE — Discharge Instructions (Signed)
 A urine culture has been ordered.  You will be contacted when the results of the urine culture are received.  If the results of your urine culture are negative and you are continue to experience symptoms, recommend follow-up with your primary care physician for further evaluation. Take medication as prescribed. Increase fluids and allow for plenty of rest.  Try to drink at least 8-10 8 ounce glasses of water  while symptoms persist. You may take over-the-counter Tylenol  as needed for pain or discomfort. Develop a toileting schedule that will allow you to urinate at least every 2 hours. Avoid caffeine such as tea, soda, or coffee while symptoms persist. Follow-up as needed.

## 2023-12-11 LAB — URINE CULTURE: Culture: NO GROWTH

## 2023-12-24 ENCOUNTER — Other Ambulatory Visit: Payer: Self-pay | Admitting: Family Medicine

## 2023-12-24 DIAGNOSIS — Z8673 Personal history of transient ischemic attack (TIA), and cerebral infarction without residual deficits: Secondary | ICD-10-CM

## 2024-01-14 ENCOUNTER — Other Ambulatory Visit: Payer: Self-pay | Admitting: Family Medicine

## 2024-01-14 DIAGNOSIS — I10 Essential (primary) hypertension: Secondary | ICD-10-CM

## 2024-01-18 ENCOUNTER — Ambulatory Visit: Attending: Nurse Practitioner | Admitting: Nurse Practitioner

## 2024-01-18 ENCOUNTER — Encounter: Payer: Self-pay | Admitting: Nurse Practitioner

## 2024-01-18 VITALS — BP 126/78 | HR 79 | Ht 68.0 in | Wt 228.0 lb

## 2024-01-18 DIAGNOSIS — I428 Other cardiomyopathies: Secondary | ICD-10-CM | POA: Diagnosis not present

## 2024-01-18 DIAGNOSIS — I671 Cerebral aneurysm, nonruptured: Secondary | ICD-10-CM

## 2024-01-18 DIAGNOSIS — I5020 Unspecified systolic (congestive) heart failure: Secondary | ICD-10-CM

## 2024-01-18 DIAGNOSIS — E669 Obesity, unspecified: Secondary | ICD-10-CM

## 2024-01-18 DIAGNOSIS — I2510 Atherosclerotic heart disease of native coronary artery without angina pectoris: Secondary | ICD-10-CM | POA: Diagnosis not present

## 2024-01-18 DIAGNOSIS — N1832 Chronic kidney disease, stage 3b: Secondary | ICD-10-CM

## 2024-01-18 DIAGNOSIS — I6523 Occlusion and stenosis of bilateral carotid arteries: Secondary | ICD-10-CM | POA: Diagnosis not present

## 2024-01-18 NOTE — Progress Notes (Unsigned)
 Cardiology Office Note:  .   Date:  06/29/2023 ID:  Deanna Thompson, DOB [birth year], MRN [id number] PCP: Bluford Jacqulyn MATSU, DO   HeartCare Providers Cardiologist:  Alvan Carrier, MD    History of Present Illness: .   Deanna  Thompson is a 68 y.o. 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  Last seen by Dayna Dunn, PA-C on December 04, 2022 for follow-up.  Was found to be overall doing well from a cardiac perspective.  She noted chronic unchanged fatigue, felt to be multifactorial.  Denying chest pain or dyspnea.  Has started walking again at the time.  Noted chronic history of episodic dizziness related to vertigo.  Updated echo revealed EF worsening at 30 to 35%, was 45 to 50%.  Dr. [place] was consulted about whether to pursue cath for worsening LV function.  Dr. [place] agreed that she would benefit from heart catheterization, right and left.  Recommended for patient to follow-up in office to discuss.  Recommended that patient touch base with Dr. Dolphus office regarding her previous brain aneurysm.  Seen by Dr. Dolphus on February 16, 2023. He arranged a CT angiogram of head in neck for further evaluation. See results below.   Underwent right and left heart catheterization on April 22, 2023. See results below.   Today she presents for follow-up post cardiac cath. Pt says overall she is doing well. Does admit to some weight gain d/t dietary indiscretions. Denies any chest pain, shortness of breath, palpitations, syncope, presyncope, dizziness, orthopnea, PND, swelling or significant weight changes, acute bleeding, or  claudication.  ROS: Negative. See HPI.  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   Studies Reviewed: SABRA    EKG: EKG is not ordered today.   Right/left heart cath 03/2023:  Right & Left Heart Catheterization 04/22/23: Hemodynamic data: Normal right heart pressure, QP/T was 1.00.  CO 4.67/CI 2.16 by Fick. Normal EDP at 13 mmHg.  No pressure gradient across the aortic valve.   Angiographic data: LV: Mildly dilated.  Global hypokinesis.  EF appears to be around 45%.  No significant mitral regurgitation.   Normal coronary arteries, codominant circulation. LM is large LAD: Gives origin to large D1, moderate-sized D2 and D3. LCx: Codominant with RCA.  Large OM 2 and OM 4.  PDA is moderate-sized. RI: Large vessel.  Traverses to the apex. RCA: Codominant with CX.  Small to moderate-sized PDA and PL branch.         Impression and recommendations: Findings consistent with nonischemic cardiomyopathy with normal coronary arteries.  Normal right heart catheterization with preserved cardiac output and index.  Carotid duplex 01/2023: Summary:  Right Carotid: Velocities in the right ICA are consistent with a 1-39%  stenosis. Non-hemodynamically significant plaque <50% noted in the  CCA. The ECA appears <50% stenosed.   Left Carotid: Velocities in the left ICA are consistent with a 1-39%  stenosis. Non-hemodynamically significant plaque <50% noted in the  CCA. The ECA appears <50% stenosed.   Vertebrals:  Bilateral vertebral arteries demonstrate antegrade flow.  Subclavians: Normal flow hemodynamics were seen in bilateral subclavian arteries.   Echo 12/2022: 1. Left ventricular ejection fraction, by estimation, is 30 to  35%. The  left ventricle has moderately decreased function. The left ventricle  demonstrates regional wall motion abnormalities (see scoring  diagram/findings for description). Left ventricular   diastolic parameters are consistent with Grade I  diastolic dysfunction  (impaired relaxation).   2. Right ventricular systolic function is normal. The right ventricular  size is normal. There is normal pulmonary artery systolic pressure.   3. The mitral valve is normal in structure. Trivial mitral valve  regurgitation. No evidence of mitral stenosis.   4. The tricuspid valve is abnormal.   5. The aortic valve has an indeterminant number of cusps. There is mild  calcification of the aortic valve. There is mild thickening of the aortic  valve. Aortic valve regurgitation is not visualized. No aortic stenosis is  present.   6. The inferior vena cava is normal in size with greater than 50%  respiratory variability, suggesting right atrial pressure of 3 mmHg.  Lexiscan  11/2017: The left ventricular ejection fraction is mildly decreased (45-54%). Very severe gut radiotracer uptake in the resting and post-injection images, most significant in the post-injection images. Overall perfusion imaging is nondiagnostic. Low LVEF is suggested however looks normal by visual evaluation. Consider correlating with echo  Physical Exam:   VS:  Ht 5' 8 (1.727 m)   Wt 228 lb (103.4 kg)   SpO2 98%   BMI 34.67 kg/m    Wt Readings from Last 3 Encounters:  01/18/24 228 lb (103.4 kg)  12/10/23 225 lb (102.1 kg)  11/04/23 226 lb (102.5 kg)    GEN: Obese, 68 y.o. female in no acute distress NECK: No JVD; No carotid bruits CARDIAC: S1/S2, RRR, no murmurs, rubs, gallops RESPIRATORY:  Clear to auscultation without rales, wheezing or rhonchi  ABDOMEN: Soft, non-tender, non-distended EXTREMITIES:  No edema; No deformity   ASSESSMENT AND PLAN: .    HFmrEF, NICM Stage C, NYHA class I-II symptoms. NICM. Recent right and left heart cath revealed normal coronary arteries. EF approximately 45%. Weight gain appears to be d/t dietary indiscretions, no signs/ symptoms of volume up. Will begin Farxiga  10 mg daily. Continue Bumex  and spironolactone . Was unable to tolerate  Losartan . Low sodium diet, fluid restriction <2L, and daily weights encouraged. Educated to contact our office for weight gain of 2 lbs overnight or 5 lbs in one week. Care and ED precautions discussed. Will repeat BMET and CBC.   2. Coronary artery calcification Denies any chest pain. Most recent heart cath revealed normal coronary arteries.  No complications post cath. Continue current medication regimen. Heart healthy diet encouraged. Care and ED precautions discussed. Obtaining labs as mentioned above.   4. Carotid artery stenosis Carotid duplex 01/2023 revealed 1-39% bilateral ICA stenosis. Denies any concerning symptoms. Continue current medication regimen.   5. 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 Denies any concerning signs or symptoms. Care and ED precautions discussed.    6. Obesity Weight loss via diet and exercise encouraged. Discussed the impact being overweight would have on cardiovascular Thompson.  7. CKD stage 3b Most recent labs around her baseline.  Starting Farxiga  10 mg daily and will repeat BMET per protocol.  Avoid nephrotoxic agents.  Continue follow-up PCP.  Dispo: Follow-up with me/[facility] in 2-3 months or sooner if anything changes.   Signed, Almarie Crate, NP

## 2024-01-18 NOTE — Patient Instructions (Addendum)
 Medication Instructions:  Your physician recommends that you continue on your current medications as directed. Please refer to the Current Medication list given to you today.  Labwork: None   Testing/Procedures: Your physician has requested that you have an echocardiogram. Echocardiography is a painless test that uses sound waves to create images of your heart. It provides your doctor with information about the size and shape of your heart and how well your heart's chambers and valves are working. This procedure takes approximately one hour. There are no restrictions for this procedure. Please do NOT wear cologne, perfume, aftershave, or lotions (deodorant is allowed). Please arrive 15 minutes prior to your appointment time.  Please note: We ask at that you not bring children with you during ultrasound (echo/ vascular) testing. Due to room size and safety concerns, children are not allowed in the ultrasound rooms during exams. Our front office staff cannot provide observation of children in our lobby area while testing is being conducted. An adult accompanying a patient to their appointment will only be allowed in the ultrasound room at the discretion of the ultrasound technician under special circumstances. We apologize for any inconvenience.  Follow-Up: Your physician recommends that you schedule a follow-up appointment in: 6 Months with Dr.Branch   Any Other Special Instructions Will Be Listed Below (If Applicable).  If you need a refill on your cardiac medications before your next appointment, please call your pharmacy.

## 2024-01-31 ENCOUNTER — Encounter: Payer: Self-pay | Admitting: Radiology

## 2024-02-02 ENCOUNTER — Ambulatory Visit
Admission: EM | Admit: 2024-02-02 | Discharge: 2024-02-02 | Disposition: A | Discharge status: Home / Self Care | Attending: Family Medicine | Admitting: Family Medicine

## 2024-02-02 ENCOUNTER — Ambulatory Visit: Attending: Nurse Practitioner

## 2024-02-02 DIAGNOSIS — L03012 Cellulitis of left finger: Secondary | ICD-10-CM

## 2024-02-02 DIAGNOSIS — I5020 Unspecified systolic (congestive) heart failure: Secondary | ICD-10-CM | POA: Diagnosis not present

## 2024-02-02 LAB — ECHOCARDIOGRAM COMPLETE
AR max vel: 1.22 cm2
AV Peak grad: 9 mmHg
Ao pk vel: 1.5 m/s
Area-P 1/2: 3.7 cm2
S' Lateral: 4.9 cm
Single Plane A4C EF: 35 %

## 2024-02-02 MED ORDER — AMOXICILLIN 500 MG PO CAPS: 500.0000 mg | ORAL_CAPSULE | Freq: Two times a day (BID) | ORAL | 0 refills | Status: DC

## 2024-02-02 NOTE — ED Provider Notes (Signed)
 [facility]    CSN: [id number] Arrival date & time: 02/02/24  1343      History   Chief Complaint No chief complaint on file.   HPI Deanna  Thompson is a 68 y.o. female.   Patient presenting today with 3-day history of progressively worsening left thumb pain under the fingernail.  Denies redness, swelling, fever, chills, obvious bleeding or drainage, loss of range of motion.  States she was cleaning the area below the thumbnail with a toothpick and thinks she stepped too far into the flesh.  So far not try anything over-the-counter for symptoms.    Past Medical History:  Diagnosis Date   Abdominal hernia    repaired   Abscess of pulp of tooth 12/12/2014   left side   Anemia    Anxiety    Arthritis    back  and down flank  right side more than left   B12 deficiency 06/12/2015   Brain aneurysm    METAL COIL PREVENTS MRI IMAGING and CLIP   Cirrhosis (HCC)    Presumably alcoholic cirrhosis   Cirrhosis (HCC)    Complication of anesthesia    Depression    ETOH abuse    Quit in 07/2011    Fatigue    due to Sjogren's disease   Flat foot (pes planus) (acquired), left foot    Folate deficiency 06/30/2016   GERD (gastroesophageal reflux disease)    HTN (hypertension)    no longer medicated, dated on 04/25/12   IBS (irritable bowel syndrome)    Iron  deficiency anemia due to chronic blood loss    Myofascial pain    Panic attack    PONV (postoperative nausea and vomiting)    Psoriatic arthritis (HCC)    Right inguinal hernia 08/16/2012   Seizures (HCC) 1997   1 seizure from coil insertion but no more seizures and no meds for seizures   Sjogren's disease    soft Bipolar disorder (HCC)    Stroke (HCC) 2019   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   Qualifier: Diagnosis of  By: Margrette MD, [place]     TIA (transient ischemic attack)    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Vertigo     Patient Active Problem List   Diagnosis Date Noted   Ataxia  11/04/2023   Chronic CHF (congestive heart failure) (HCC) 06/16/2022   Incisional hernia, without obstruction or gangrene 11/10/2021   Bipolar disorder (HCC) 01/13/2021   Hyperlipidemia 01/13/2021   IBS (irritable bowel syndrome)    History of stroke 08/01/2017   Essential hypertension 07/31/2017   GERD (gastroesophageal reflux disease) 07/31/2017   Brain aneurysm 07/31/2017   CKD (chronic kidney disease), stage III (HCC) 07/31/2017   LBBB (left bundle branch block) 07/31/2017   Psoriatic arthritis (HCC) 04/15/2015   Cirrhosis of liver (HCC) 09/19/2014   Iron  deficiency anemia due to chronic blood loss    Insomnia 03/17/2014    Past Surgical History:  Procedure Laterality Date   AGILE CAPSULE N/A 06/21/2014   Procedure: AGILE CAPSULE;  Surgeon: Margo LITTIE Haddock, MD;  Location: [facility];  Service: Endoscopy;  Laterality: N/A;  0700   BIOPSY  10/13/2011   SLF: mild gastritis/duodenal polypoid lesion    BIOPSY  06/04/2020   Procedure: BIOPSY;  Surgeon: Cindie Carlin POUR, DO;  Location: [facility];  Service: Endoscopy;;   CEREBRAL ANEURYSM REPAIR  1997   CHOLECYSTECTOMY N/A 10/28/2018   Procedure: LAPAROSCOPIC CHOLECYSTECTOMY WITH INTRAOPERATIVE CHOLANGIOGRAM;  Surgeon: Kallie Manuelita BROCKS,  MD;  Location: [facility];  Service: General;  Laterality: N/A;   COLONOSCOPY  10/13/2011   DOQ:pwuzmwjo hemorrhoids/varices rectal/lesion in ascending colon(HYPERPLASTIC POLYP)   COLONOSCOPY N/A 01/17/2013   SLF: 1. Internal hemorrhoids 2. Ascending colon polyps 3.  Rectal varices.    COLONOSCOPY N/A 08/10/2016   Dr. Harvey: redundant left colon. external and internal hemorhroids. 5-10 year surveillance   ESOPHAGOGASTRODUODENOSCOPY N/A 06/29/2014   SLF: 1. anemia due to polypoid lesions in teh antrum, and duodenal polyps 2. single duodenum AVM   ESOPHAGOGASTRODUODENOSCOPY (EGD) WITH PROPOFOL  N/A 03/07/2019   Procedure: ESOPHAGOGASTRODUODENOSCOPY (EGD) WITH PROPOFOL ;  Surgeon: Harvey Margo CROME, MD;   [facility]: [facility];  Service: Endoscopy;  Laterality: N/A;  1:00pm   ESOPHAGOGASTRODUODENOSCOPY (EGD) WITH PROPOFOL  N/A 03/16/2019   Procedure: ESOPHAGOGASTRODUODENOSCOPY (EGD) WITH PROPOFOL ;  Surgeon: Harvey Margo CROME, MD;  [facility]: [facility];  Service: Endoscopy;  Laterality: N/A;   ESOPHAGOGASTRODUODENOSCOPY (EGD) WITH PROPOFOL  N/A 06/04/2020   Procedure: ESOPHAGOGASTRODUODENOSCOPY (EGD) WITH PROPOFOL ;  Surgeon: Cindie Carlin POUR, DO;  [facility]: [facility];  Service: Endoscopy;  Laterality: N/A;  am   FLEXIBLE SIGMOIDOSCOPY N/A 01/21/2016   Procedure: FLEXIBLE SIGMOIDOSCOPY;  Surgeon: Margo CROME Harvey, MD;  [facility]: [facility];  Service: Endoscopy;  Laterality: N/A;  245   GIVENS CAPSULE STUDY N/A 06/29/2014   Procedure: GIVENS CAPSULE STUDY;  Surgeon: Margo CROME Harvey, MD;  [facility]: [facility];  Service: Endoscopy;  Laterality: N/A;   GIVENS CAPSULE STUDY N/A 07/13/2014   Procedure: GIVENS CAPSULE STUDY;  Surgeon: Margo CROME Harvey, MD;  [facility]: [facility];  Service: Endoscopy;  Laterality: N/A;  700   HEMORRHOID BANDING N/A 01/21/2016   Procedure: HEMORRHOID BANDING;  Surgeon: Margo CROME Harvey, MD;  [facility]: [facility];  Service: Endoscopy;  Laterality: N/A;   HEMOSTASIS CLIP PLACEMENT  03/16/2019   Procedure: HEMOSTASIS CLIP PLACEMENT;  Surgeon: Harvey Margo CROME, MD;  [facility]: [facility];  Service: Endoscopy;;  x 4   HEMOSTASIS CONTROL  03/16/2019   Procedure: HEMOSTASIS CONTROL;  Surgeon: Harvey Margo CROME, MD;  [facility]: [facility];  Service: Endoscopy;;  injection with epinephine 0.1mg /ml via scope and bipolar cautery with gold probe   INGUINAL HERNIA REPAIR Right 08/28/2013   Procedure: RIGHT NGUINAL HERNIORRHAPHY WITH MESH;  Surgeon: Oneil DELENA Budge, MD;  Location: [facility];  Service: General;  Laterality: Right;   INSERTION OF MESH N/A 06/09/2013   Procedure: INSERTION OF MESH;  Surgeon: Oneil DELENA Budge, MD;  Location: [facility];  Service: General;  Laterality: N/A;    INSERTION OF MESH Right 08/28/2013   Procedure: INSERTION OF MESH;  Surgeon: Oneil DELENA Budge, MD;  Location: [facility];  Service: General;  Laterality: Right;   IR RADIOLOGIST EVAL & MGMT  02/17/2023   PARACENTESIS  Feb 2014   10.7 liters   RIGHT/LEFT HEART CATH AND CORONARY ANGIOGRAPHY N/A 04/22/2023   Procedure: RIGHT/LEFT HEART CATH AND CORONARY ANGIOGRAPHY;  Surgeon: Ladona Heinz, MD;  Location: [facility];  Service: Cardiovascular;  Laterality: N/A;   TOOTH EXTRACTION Left 12/12/14   UMBILICAL HERNIA REPAIR N/A 06/09/2013   Procedure: UMBILICAL HERNIORRHAPHY WITH MESH;  Surgeon: Oneil DELENA Budge, MD;  Location: [facility];  Service: General;  Laterality: N/A;    OB History     Gravida  0   Para  0   Term  0   Preterm  0   AB  0   Living  0  SAB  0   IAB  0   Ectopic  0   Multiple  0   Live Births  0            Home Medications    Prior to Admission medications   Medication Sig Start Date End Date Taking? Authorizing Provider  amoxicillin  (AMOXIL ) 500 MG capsule Take 1 capsule (500 mg total) by mouth 2 (two) times daily. 02/02/24  Yes Stuart Vernell Norris, PA-C  acetaminophen  (TYLENOL ) 500 MG tablet Take 500 mg by mouth every 6 (six) hours as needed for moderate pain (pain score 4-6).    [provider]  atorvastatin  (LIPITOR ) 40 MG tablet TAKE ONE TABLET BY MOUTH ONCE DAILY. 12/24/23   Cook, Jayce G, DO  bumetanide  (BUMEX ) 1 MG tablet TAKE (1) TABLET BY MOUTH TWICE DAILY. 10/21/23   Cook, Jayce G, DO  buPROPion  (WELLBUTRIN  XL) 300 MG 24 hr tablet Take 300 mg by mouth daily. 04/25/22   [provider]  Camphor-Menthol -Methyl Sal (SALONPAS) 3.04-04-08 % PTCH Place 1 patch onto the skin daily as needed (pain.).    [provider]  cholecalciferol  (VITAMIN D3) 25 MCG (1000 UT) tablet Take 2 tablets (2,000 Units total) by mouth daily. 12/14/18   Fields, Sandi L, MD  DULoxetine (CYMBALTA) 60 MG capsule Take 60 mg by mouth daily.    [provider]  famotidine  (PEPCID ) 20 MG tablet Take 1 tablet (20 mg total) by mouth 2 (two) times daily before a meal. 10/17/19   Rudy Josette RAMAN, PA-C  fluticasone  (FLONASE ) 50 MCG/ACT nasal spray Place 1 spray into both nostrils See admin instructions. Instill 1 spray into each nostril every morning, may also use later in the day as needed for sinus congestion    [provider]  HYDROcodone -acetaminophen  (NORCO/VICODIN) 5-325 MG tablet Take 1 tablet by mouth every 6 (six) hours as needed. 05/18/23   [provider]  lamoTRIgine (LAMICTAL) 200 MG tablet Take 400 mg by mouth daily. 02/06/21   [provider]  LINZESS  145 MCG CAPS capsule TAKE (1) CAPSULE BY MOUTH EVERY DAY BEFORE BREAKFAST. 06/20/23   Federico Rosario BROCKS, MD  loratadine  (CLARITIN ) 10 MG tablet Take 10 mg by mouth daily as needed for allergies.    [provider]  phenazopyridine  (PYRIDIUM ) 100 MG tablet Take 1 tablet (100 mg total) by mouth 3 (three) times daily as needed for pain. 12/10/23   Leath-Warren, Etta PARAS, NP  Polyvinyl Alcohol -Povidone (REFRESH OP) Place 1 drop into both eyes 3 (three) times daily as needed (dry eyes).    [provider]  Simethicone  (GAS-X PO) Take 1 tablet by mouth 2 (two) times daily as needed (bloating/flatulence).    [provider]  sodium chloride  (OCEAN) 0.65 % nasal spray Place 1 spray into the nose 2 (two) times daily as needed for congestion.    [provider]  spironolactone  (ALDACTONE ) 100 MG tablet TAKE 1/2 TABLET BY MOUTH TWICE DAILY. 01/17/24   Cook, Jayce G, DO  Tofacitinib  Citrate ER (XELJANZ  XR) 11 MG TB24 Take 1 tablet by mouth daily.    [provider]  XIIDRA  5 % SOLN Apply 1 drop to eye 2 (two) times daily. 09/08/21   [provider]    Family History Family History  Problem Relation Age of Onset   Cirrhosis Mother        nash   COPD Father    Heart disease Father    Asthma Other  Heart disease  Other    Colon cancer Neg Hx    Inflammatory bowel disease Neg Hx    Stomach cancer Neg Hx    Esophageal cancer Neg Hx     Social History Social History   Tobacco Use   Smoking status: Former    Current packs/day: 0.00    Average packs/day: 2.0 packs/day for 12.0 years (24.0 ttl pk-yrs)    Types: Cigarettes    Start date: 03/31/1983    Quit date: 03/31/1995    Years since quitting: 28.8   Smokeless tobacco: Never   Tobacco comments:    quit in 1997  Vaping Use   Vaping status: Never Used  Substance Use Topics   Alcohol  use: No    Comment: Bottle of wine a day for years, but quit in 07/2011   Drug use: No     Allergies   Keflex [cephalexin], Omeprazole, Orencia [abatacept], Other, Phenytoin, Lidocaine  viscous hcl, Promethazine  hcl, and Milk-related compounds   Review of Systems Review of Systems Per HPI  Physical Exam Triage Vital Signs ED Triage Vitals  Encounter Vitals Group     BP 02/02/24 1419 107/65     Girls Systolic BP Percentile --      Girls Diastolic BP Percentile --      Boys Systolic BP Percentile --      Boys Diastolic BP Percentile --      Pulse Rate 02/02/24 1419 90     Resp 02/02/24 1419 20     Temp 02/02/24 1419 98.6 F (37 C)     Temp Source 02/02/24 1419 Oral     SpO2 02/02/24 1419 93 %     Weight --      Height --      Head Circumference --      Peak Flow --      Pain Score 02/02/24 1422 8     Pain Loc --      Pain Education --      Exclude from Growth Chart --    No data found.  Updated Vital Signs BP 107/65 (BP Location: Right Arm)   Pulse 90   Temp 98.6 F (37 C) (Oral)   Resp 20   SpO2 93%   Visual Acuity Right Eye Distance:   Left Eye Distance:   Bilateral Distance:    Right Eye Near:   Left Eye Near:    Bilateral Near:     Physical Exam Vitals and nursing note reviewed.  Constitutional:      Appearance: Normal appearance. She is not ill-appearing.  HENT:     Head: Atraumatic.  Eyes:     Extraocular Movements:  Extraocular movements intact.     Conjunctiva/sclera: Conjunctivae normal.  Cardiovascular:     Rate and Rhythm: Normal rate.  Pulmonary:     Effort: Pulmonary effort is normal.  Musculoskeletal:        General: Tenderness present. Normal range of motion.     Cervical back: Normal range of motion and neck supple.  Skin:    General: Skin is warm.     Comments: Crusting, tenderness to palpation to the nailbed of the left thumb distally  Neurological:     Mental Status: She is alert and oriented to person, place, and time.     Comments: Left upper extremity neurovascularly intact  Psychiatric:        Mood and Affect: Mood normal.        Thought Content: Thought content normal.  Judgment: Judgment normal.      UC Treatments / Results  Labs (all labs ordered are listed, but only abnormal results are displayed) Labs Reviewed - No data to display  EKG   Radiology No results found.  Procedures Procedures (including critical care time)  Medications Ordered in UC Medications - No data to display  Initial Impression / Assessment and Plan / UC Course  I have reviewed the triage vital signs and the nursing notes.  Pertinent labs & imaging results that were available during my care of the patient were reviewed by me and considered in my medical decision making (see chart for details).     Treat with Amoxil , Epsom salt soaks, Neosporin, bandaging.  Declines tetanus shot today though does not know when her last one was.  Return for worsening or unresolving symptoms.  Final Clinical Impressions(s) / UC Diagnoses   Final diagnoses:  Subungual infection of finger of left hand     Discharge Instructions      In addition to taking the course of antibiotics prescribed, you may do warm Epsom salt soaks throughout the day, wash hands frequently, avoid picking at or poking the area with anything and may apply Neosporin and a bandage to keep the area covered until healed.   Over-the-counter pain relievers as needed.    ED Prescriptions     Medication Sig Dispense Auth. Provider   amoxicillin  (AMOXIL ) 500 MG capsule Take 1 capsule (500 mg total) by mouth 2 (two) times daily. 14 capsule Stuart Vernell Norris, [place]      PDMP not reviewed this encounter.   Stuart Vernell Norris, [place] 02/02/24 1455

## 2024-02-02 NOTE — Discharge Instructions (Signed)
 In addition to taking the course of antibiotics prescribed, you may do warm Epsom salt soaks throughout the day, wash hands frequently, avoid picking at or poking the area with anything and may apply Neosporin and a bandage to keep the area covered until healed.  Over-the-counter pain relievers as needed.

## 2024-02-02 NOTE — ED Triage Notes (Signed)
 Pt reports left thumb pain under the nail, it is really tender, states she had used a toothpick to clean under her nails, and went too far in pain started x 3 days later and now the under of the finger is sore as well.

## 2024-02-03 ENCOUNTER — Telehealth: Payer: Self-pay | Admitting: Nurse Practitioner

## 2024-02-03 NOTE — Telephone Encounter (Signed)
 Pt calling to f/u on echo. Echo showed ejection fraction 20-25% in left ventricle. She is concerned about this and would like a callback to discuss. Please advise.

## 2024-02-04 ENCOUNTER — Telehealth: Payer: Self-pay | Admitting: Nurse Practitioner

## 2024-02-04 NOTE — Telephone Encounter (Signed)
 Called and s/w patient regarding her recent Echo results (okay per DPR). EF dropped to 20-25%. Recently stopped Farxiga  d/t a UTI. I recommended we bring her back in for sooner office visit to consider other options to adjust her GDMT and possibly consider HF clinic referral. She was agreeable to a sooner office visit. Pt denies any other concerns at this time. She verbalized understanding and was appreciative of my call.    Deanna Crate, NP

## 2024-02-08 ENCOUNTER — Ambulatory Visit: Attending: Nurse Practitioner | Admitting: Nurse Practitioner

## 2024-02-08 ENCOUNTER — Encounter: Payer: Self-pay | Admitting: Nurse Practitioner

## 2024-02-08 VITALS — BP 128/83 | HR 86 | Ht 68.0 in | Wt 229.0 lb

## 2024-02-08 DIAGNOSIS — I6523 Occlusion and stenosis of bilateral carotid arteries: Secondary | ICD-10-CM

## 2024-02-08 DIAGNOSIS — I2510 Atherosclerotic heart disease of native coronary artery without angina pectoris: Secondary | ICD-10-CM

## 2024-02-08 DIAGNOSIS — I5020 Unspecified systolic (congestive) heart failure: Secondary | ICD-10-CM

## 2024-02-08 DIAGNOSIS — N1832 Chronic kidney disease, stage 3b: Secondary | ICD-10-CM

## 2024-02-08 DIAGNOSIS — E669 Obesity, unspecified: Secondary | ICD-10-CM

## 2024-02-08 DIAGNOSIS — I428 Other cardiomyopathies: Secondary | ICD-10-CM | POA: Diagnosis not present

## 2024-02-08 DIAGNOSIS — I671 Cerebral aneurysm, nonruptured: Secondary | ICD-10-CM

## 2024-02-08 DIAGNOSIS — R053 Chronic cough: Secondary | ICD-10-CM

## 2024-02-08 MED ORDER — CARVEDILOL 3.125 MG PO TABS: 3.1250 mg | ORAL_TABLET | Freq: Two times a day (BID) | ORAL | 3 refills | Status: AC

## 2024-02-08 NOTE — Patient Instructions (Addendum)
 Medication Instructions:   START Coreg 3.125 mg twice a day   Labwork: None today  Testing/Procedures: None today  Follow-Up: 6 weeks E.Peck,NP  Any Other Special Instructions Will Be Listed Below (If Applicable).  If you need a refill on your cardiac medications before your next appointment, please call your pharmacy.

## 2024-02-08 NOTE — Progress Notes (Unsigned)
 Cardiology Office Note:  .   Date:  02/08/2024 ID:  Deanna  Thompson, DOB 1956/03/26, MRN [id number] PCP: Bluford Jacqulyn MATSU, DO  [facility] Providers Cardiologist:  Alvan Carrier, MD    History of Present Illness: .   Deanna  Thompson is a 68 y.o. female with a PMH of HFmrEF, coronary calcification, HTN, LBBB, mild aortic stenosis with mild aortic insufficiency, prior cerebral aneurysm, s/p clipping, suspected brainstem stroke in 2019, history of EtOH abuse, anemia with past history of GI bleed, prior gastric AVMs, depression, anxiety, bipolar disorder, Sjogren's syndrome, IBS, alpha gal, mild milk allergy, psoriatic arthritis, CKD stage IIIa, and vertigo, who presents today for follow-up.  Last seen by Dayna Dunn, PA-C on December 04, 2022 for follow-up.  Was found to be overall doing well from a cardiac perspective.  She noted chronic unchanged fatigue, felt to be multifactorial.  Denying chest pain or dyspnea.  Has started walking again at the time.  Noted chronic history of episodic dizziness related to vertigo.  Updated echo revealed EF worsening at 30 to 35%, was 45 to 50%.  Dr. Jordan was consulted about whether to pursue cath for worsening LV function.  Dr. Jordan agreed that she would benefit from heart catheterization, right and left.  Recommended for patient to follow-up in office to discuss.  Recommended that patient touch base with Dr. Dolphus office regarding her previous brain aneurysm.  Seen by Dr. Dolphus on February 16, 2023. He arranged a CT angiogram of head in neck for further evaluation. See results below.   Underwent right and left heart catheterization on April 22, 2023. See results below.   06/29/2023 - Today she presents for follow-up post cardiac cath. Pt says overall she is doing well. Does admit to some weight gain d/t dietary indiscretions. Denies any chest pain, shortness of breath, palpitations, syncope, presyncope, dizziness, orthopnea, PND, swelling or  significant weight changes, acute bleeding, or claudication.  01/18/2024 -here for follow-up and tells me that she had to quit Farxiga  as she had 2 UTIs after starting this medication.  Has been having issues with IBS and recent significant diarrhea. Otherwise doing well from a cardiac standpoint. Denies any chest pain, shortness of breath, palpitations, syncope, presyncope, dizziness, orthopnea, PND, swelling or significant weight changes, acute bleeding, or claudication.  Echo obtained on 02/02/2024 revealed LVEF 20-25%, grade 1 DD, no significant valvular abnormalities.   She is here for follow-up based on these results. She is overall doing well. Does wonder what caused her EF to drop low. Denies any chest pain, shortness of breath, palpitations, syncope, presyncope, dizziness, orthopnea, PND, swelling or significant weight changes, acute bleeding, or claudication. Does admit to chronic cough.   ROS: Negative. See HPI.  SH: Experience in social services, received Master's degree at [employer]. Loves to go thrifting and enjoys crafting.   Studies Reviewed: SABRA    EKG: EKG is not ordered today.       Right/left heart cath 03/2023:  Right & Left Heart Catheterization 04/22/23: Hemodynamic data: Normal right heart pressure, QP/T was 1.00.  CO 4.67/CI 2.16 by Fick. Normal EDP at 13 mmHg.  No pressure gradient across the aortic valve.   Angiographic data: LV: Mildly dilated.  Global hypokinesis.  EF appears to be around 45%.  No significant mitral regurgitation.   Normal coronary arteries, codominant circulation. LM is large LAD: Gives origin to large D1, moderate-sized D2 and D3. LCx: Codominant with RCA.  Large OM 2 and OM 4.  PDA is moderate-sized.  RI: Large vessel.  Traverses to the apex. RCA: Codominant with CX.  Small to moderate-sized PDA and PL branch.         Impression and recommendations: Findings consistent with nonischemic cardiomyopathy with normal coronary arteries.  Normal  right heart catheterization with preserved cardiac output and index.  Carotid duplex 01/2023: Summary:  Right Carotid: Velocities in the right ICA are consistent with a 1-39%  stenosis. Non-hemodynamically significant plaque <50% noted in the  CCA. The ECA appears <50% stenosed.   Left Carotid: Velocities in the left ICA are consistent with a 1-39%  stenosis. Non-hemodynamically significant plaque <50% noted in the  CCA. The ECA appears <50% stenosed.   Vertebrals:  Bilateral vertebral arteries demonstrate antegrade flow.  Subclavians: Normal flow hemodynamics were seen in bilateral subclavian arteries.   Echo 12/2022: 1. Left ventricular ejection fraction, by estimation, is 30 to 35%. The  left ventricle has moderately decreased function. The left ventricle  demonstrates regional wall motion abnormalities (see scoring  diagram/findings for description). Left ventricular   diastolic parameters are consistent with Grade I diastolic dysfunction  (impaired relaxation).   2. Right ventricular systolic function is normal. The right ventricular  size is normal. There is normal pulmonary artery systolic pressure.   3. The mitral valve is normal in structure. Trivial mitral valve  regurgitation. No evidence of mitral stenosis.   4. The tricuspid valve is abnormal.   5. The aortic valve has an indeterminant number of cusps. There is mild  calcification of the aortic valve. There is mild thickening of the aortic  valve. Aortic valve regurgitation is not visualized. No aortic stenosis is  present.   6. The inferior vena cava is normal in size with greater than 50%  respiratory variability, suggesting right atrial pressure of 3 mmHg.  Lexiscan  11/2017: The left ventricular ejection fraction is mildly decreased (45-54%). Very severe gut radiotracer uptake in the resting and post-injection images, most significant in the post-injection images. Overall perfusion imaging is nondiagnostic. Low  LVEF is suggested however looks normal by visual evaluation. Consider correlating with echo  Physical Exam:   VS:  BP 128/83 (BP Location: Left Arm, Cuff Size: Large)   Pulse 86   Ht 5' 8 (1.727 m)   Wt 229 lb (103.9 kg)   SpO2 95%   BMI 34.82 kg/m    Wt Readings from Last 3 Encounters:  02/08/24 229 lb (103.9 kg)  01/18/24 228 lb (103.4 kg)  12/10/23 225 lb (102.1 kg)    GEN: Obese, 67 y.o. female in no acute distress NECK: No JVD; No carotid bruits CARDIAC: S1/S2, RRR, no murmurs, rubs, gallops RESPIRATORY:  Clear to auscultation without rales, wheezing or rhonchi  ABDOMEN: Soft, non-tender, non-distended EXTREMITIES:  No edema; No deformity   ASSESSMENT AND PLAN: .    HFrEF, NICM Stage C, NYHA class I-II symptoms. NICM. Recent right and left heart cath revealed normal coronary arteries. EF approximately 45%,however recent updated echo showed LVEF reduced to 20 to 25%, more than likely from stopping Farxiga . Weight stable.  Will begin carvedilol 3.125 mg twice daily.  Continue Bumex  and spironolactone . Was unable to tolerate Losartan . Low sodium diet, fluid restriction <2L, and daily weights encouraged. Educated to contact our office for weight gain of 2 lbs overnight or 5 lbs in one week. Care and ED precautions discussed.  Plan to adjust GDMT over time and then update echocardiogram after GDMT is maxed titrated.  2. Coronary artery calcification Denies any chest pain. Most  recent heart cath revealed normal coronary arteries.  Continue current medication regimen. Heart healthy diet encouraged. Care and ED precautions discussed.   3. Carotid artery stenosis Carotid duplex 01/2023 revealed 1-39% bilateral ICA stenosis. Denies any concerning symptoms. Continue current medication regimen.   5. 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 Her most recent workup by IR showed stable 3 mm aneurysm arising  from left MCA bifurcation. Denies any concerning signs or symptoms. Care and ED precautions discussed.    6. Obesity Weight loss via diet and exercise encouraged. Discussed the impact being overweight would have on cardiovascular risk.  7. CKD stage 3b Most recent labs around her baseline. Could not tolerate Farxiga  - see above. Avoid nephrotoxic agents.  Continue follow-up PCP.  8. Chronic cough Etiology unclear. Recommended to follow with PCP.  Dispo: Follow-up with MD/APP in 6 weeks or sooner if anything changes.   Signed, Almarie Crate, NP

## 2024-02-15 ENCOUNTER — Ambulatory Visit: Payer: Self-pay | Admitting: Nurse Practitioner

## 2024-02-19 ENCOUNTER — Ambulatory Visit
Admission: EM | Admit: 2024-02-19 | Discharge: 2024-02-19 | Disposition: A | Discharge status: Home / Self Care | Attending: Nurse Practitioner | Admitting: Nurse Practitioner

## 2024-02-19 DIAGNOSIS — R300 Dysuria: Secondary | ICD-10-CM

## 2024-02-19 LAB — POCT URINE DIPSTICK
Bilirubin, UA: NEGATIVE
Glucose, UA: NEGATIVE mg/dL
Ketones, POC UA: NEGATIVE mg/dL
Nitrite, UA: NEGATIVE
POC PROTEIN,UA: NEGATIVE
Spec Grav, UA: >=1.03 — AB (ref 1.010–1.025)
Urobilinogen, UA: 0.2 U/dL
pH, UA: 6 (ref 5.0–8.0)

## 2024-02-19 MED ORDER — PHENAZOPYRIDINE HCL 100 MG PO TABS: 100.0000 mg | ORAL_TABLET | Freq: Three times a day (TID) | ORAL | 0 refills | Status: DC | PRN

## 2024-02-19 NOTE — ED Triage Notes (Signed)
 Pt reports she has frequent urination, burning, sweating, and low back pain since this morning

## 2024-02-19 NOTE — Discharge Instructions (Signed)
 A urine culture is pending.  You will be contacted when the results of the urine culture are received.  You will also have access to the results via MyChart. Take medication as prescribed. You may take over-the-counter Tylenol  as needed for pain, fever, or general discomfort. Increase your fluid intake.  Try to drink at least 8-10 8 ounce glasses of water  daily. Develop a toileting schedule that will allow you to urinate at least every 2 hours. Avoid caffeine such as tea, soda, or coffee while symptoms persist. If the result of your urine culture is negative and you continue to experience symptoms, recommend follow-up with your primary care physician for further evaluation and to discuss possible referral to urology. Follow-up as needed.

## 2024-02-19 NOTE — ED Provider Notes (Signed)
 [facility]    CSN: [id number] Arrival date & time: 02/19/24  1120      History   Chief Complaint No chief complaint on file.   HPI Deanna  Thompson is a 68 y.o. female.   The history is provided by the patient.   Patient presents for complaints of pain with urination, and urinary frequency that started this morning.  Patient also endorses an episode of sweating and low back pain.  She denies fever, chills, abdominal pain, hematuria, flank pain, decreased urine stream, or vaginal symptoms.  Patient states that she was treated for UTI approximately 10 days ago.  Per review of the chart, patient was advised she was treated for an infection of her finger.  Patient reports that she has had recurrent urinary tract infections since starting the Farxiga .  States that she has been off the Farxiga  for the past few months.  Past Medical History:  Diagnosis Date   Abdominal hernia    repaired   Abscess of pulp of tooth 12/12/2014   left side   Anemia    Anxiety    Arthritis    back  and down flank  right side more than left   B12 deficiency 06/12/2015   Brain aneurysm    METAL COIL PREVENTS MRI IMAGING and CLIP   Cirrhosis (HCC)    Presumably alcoholic cirrhosis   Cirrhosis (HCC)    Complication of anesthesia    Depression    ETOH abuse    Quit in 07/2011    Fatigue    due to Sjogren's disease   Flat foot (pes planus) (acquired), left foot    Folate deficiency 06/30/2016   GERD (gastroesophageal reflux disease)    HTN (hypertension)    no longer medicated, dated on 04/25/12   IBS (irritable bowel syndrome)    Iron  deficiency anemia due to chronic blood loss    Myofascial pain    Panic attack    PONV (postoperative nausea and vomiting)    Psoriatic arthritis (HCC)    Right inguinal hernia 08/16/2012   Seizures (HCC) 1997   1 seizure from coil insertion but no more seizures and no meds for seizures   Sjogren's disease    soft Bipolar disorder (HCC)    Stroke  (HCC) 2019   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   Qualifier: Diagnosis of  By: Margrette MD, Taft     TIA (transient ischemic attack)    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Vertigo     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   Chronic CHF (congestive heart failure) (HCC) 06/16/2022   Incisional hernia, without obstruction or gangrene 11/10/2021   Bipolar disorder (HCC) 01/13/2021   Hyperlipidemia 01/13/2021   IBS (irritable bowel syndrome)    History of stroke 08/01/2017   Essential hypertension 07/31/2017   GERD (gastroesophageal reflux disease) 07/31/2017   Brain aneurysm 07/31/2017   CKD (chronic kidney disease), stage III (HCC) 07/31/2017   LBBB (left bundle branch block) 07/31/2017   Psoriatic arthritis (HCC) 04/15/2015   Cirrhosis of liver (HCC) 09/19/2014   Iron  deficiency anemia due to chronic blood loss    Insomnia 03/17/2014    Past Surgical History:  Procedure Laterality Date   AGILE CAPSULE N/A 06/21/2014   Procedure: AGILE CAPSULE;  Surgeon: Margo LITTIE Haddock, MD;  [facility]: [facility];  Service: Endoscopy;  Laterality: N/A;  0700   BIOPSY  10/13/2011   SLF: mild gastritis/duodenal polypoid lesion  BIOPSY  06/04/2020   Procedure: BIOPSY;  Surgeon: Cindie Carlin POUR, DO;  Location: [facility];  Service: Endoscopy;;   CEREBRAL ANEURYSM REPAIR  1997   CHOLECYSTECTOMY N/A 10/28/2018   Procedure: LAPAROSCOPIC CHOLECYSTECTOMY WITH INTRAOPERATIVE CHOLANGIOGRAM;  Surgeon: Kallie Manuelita BROCKS, MD;  Location: [facility];  Service: General;  Laterality: N/A;   COLONOSCOPY  10/13/2011   DOQ:pwuzmwjo hemorrhoids/varices rectal/lesion in ascending colon(HYPERPLASTIC POLYP)   COLONOSCOPY N/A 01/17/2013   SLF: 1. Internal hemorrhoids 2. Ascending colon polyps 3.  Rectal varices.    COLONOSCOPY N/A 08/10/2016   Dr. Harvey: redundant left colon. external and internal hemorhroids. 5-10 year surveillance   ESOPHAGOGASTRODUODENOSCOPY N/A  06/29/2014   SLF: 1. anemia due to polypoid lesions in teh antrum, and duodenal polyps 2. single duodenum AVM   ESOPHAGOGASTRODUODENOSCOPY (EGD) WITH PROPOFOL  N/A 03/07/2019   Procedure: ESOPHAGOGASTRODUODENOSCOPY (EGD) WITH PROPOFOL ;  Surgeon: Harvey Margo CROME, MD;  [facility];  Service: Endoscopy;  Laterality: N/A;  1:00pm   ESOPHAGOGASTRODUODENOSCOPY (EGD) WITH PROPOFOL  N/A 03/16/2019   Procedure: ESOPHAGOGASTRODUODENOSCOPY (EGD) WITH PROPOFOL ;  Surgeon: Harvey Margo CROME, MD;  [facility];  Service: Endoscopy;  Laterality: N/A;   ESOPHAGOGASTRODUODENOSCOPY (EGD) WITH PROPOFOL  N/A 06/04/2020   Procedure: ESOPHAGOGASTRODUODENOSCOPY (EGD) WITH PROPOFOL ;  Surgeon: Cindie Carlin POUR, DO;  [facility];  Service: Endoscopy;  Laterality: N/A;  am   FLEXIBLE SIGMOIDOSCOPY N/A 01/21/2016   Procedure: FLEXIBLE SIGMOIDOSCOPY;  Surgeon: Margo CROME Harvey, MD;  [facility];  Service: Endoscopy;  Laterality: N/A;  245   GIVENS CAPSULE STUDY N/A 06/29/2014   Procedure: GIVENS CAPSULE STUDY;  Surgeon: Margo CROME Harvey, MD;  [facility];  Service: Endoscopy;  Laterality: N/A;   GIVENS CAPSULE STUDY N/A 07/13/2014   Procedure: GIVENS CAPSULE STUDY;  Surgeon: Margo CROME Harvey, MD;  [facility];  Service: Endoscopy;  Laterality: N/A;  700   HEMORRHOID BANDING N/A 01/21/2016   Procedure: HEMORRHOID BANDING;  Surgeon: Margo CROME Harvey, MD;  [facility];  Service: Endoscopy;  Laterality: N/A;   HEMOSTASIS CLIP PLACEMENT  03/16/2019   Procedure: HEMOSTASIS CLIP PLACEMENT;  Surgeon: Harvey Margo CROME, MD;  [facility];  Service: Endoscopy;;  x 4   HEMOSTASIS CONTROL  03/16/2019   Procedure: HEMOSTASIS CONTROL;  Surgeon: Harvey Margo CROME, MD;  [facility];  Service: Endoscopy;;  injection with epinephine 0.1mg /ml via scope and bipolar cautery with gold probe   INGUINAL HERNIA REPAIR Right 08/28/2013   Procedure: RIGHT NGUINAL  HERNIORRHAPHY WITH MESH;  Surgeon: Oneil DELENA Budge, MD;  Location: [facility];  Service: General;  Laterality: Right;   INSERTION OF MESH N/A 06/09/2013   Procedure: INSERTION OF MESH;  Surgeon: Oneil DELENA Budge, MD;  Location: [facility];  Service: General;  Laterality: N/A;   INSERTION OF MESH Right 08/28/2013   Procedure: INSERTION OF MESH;  Surgeon: Oneil DELENA Budge, MD;  Location: [facility];  Service: General;  Laterality: Right;   IR RADIOLOGIST EVAL & MGMT  02/17/2023   PARACENTESIS  Feb 2014   10.7 liters   RIGHT/LEFT HEART CATH AND CORONARY ANGIOGRAPHY N/A 04/22/2023   Procedure: RIGHT/LEFT HEART CATH AND CORONARY ANGIOGRAPHY;  Surgeon: Ladona Heinz, MD;  [facility];  Service: Cardiovascular;  Laterality: N/A;   TOOTH EXTRACTION Left 12/12/14   UMBILICAL HERNIA REPAIR N/A 06/09/2013   Procedure: UMBILICAL HERNIORRHAPHY WITH MESH;  Surgeon: Oneil DELENA Budge, MD;  Location: [facility];  Service:  General;  Laterality: N/A;    OB History     Gravida  0   Para  0   Term  0   Preterm  0   AB  0   Living  0      SAB  0   IAB  0   Ectopic  0   Multiple  0   Live Births  0            Home Medications    Prior to Admission medications   Medication Sig Start Date End Date Taking? Authorizing Provider  phenazopyridine  (PYRIDIUM ) 100 MG tablet Take 1 tablet (100 mg total) by mouth 3 (three) times daily as needed for pain. 02/19/24  Yes Leath-Warren, Etta PARAS, NP  acetaminophen  (TYLENOL ) 500 MG tablet Take 500 mg by mouth every 6 (six) hours as needed for moderate pain (pain score 4-6).    [provider]  amoxicillin  (AMOXIL ) 500 MG capsule Take 1 capsule (500 mg total) by mouth 2 (two) times daily. 02/02/24   Stuart Vernell Norris, PA-C  atorvastatin  (LIPITOR ) 40 MG tablet TAKE ONE TABLET BY MOUTH ONCE DAILY. 12/24/23   Cook, Jayce G, DO  bumetanide  (BUMEX ) 1 MG tablet TAKE (1) TABLET BY MOUTH TWICE DAILY. 10/21/23   Cook, Jayce G, DO  buPROPion  (WELLBUTRIN  XL) 300  MG 24 hr tablet Take 300 mg by mouth daily. 04/25/22   [provider]  Camphor-Menthol -Methyl Sal (SALONPAS) 3.04-04-08 % PTCH Place 1 patch onto the skin daily as needed (pain.).    [provider]  carvedilol  (COREG ) 3.125 MG tablet Take 1 tablet (3.125 mg total) by mouth 2 (two) times daily. 02/08/24 05/08/24  Alvan Dorn FALCON, MD  cholecalciferol  (VITAMIN D3) 25 MCG (1000 UT) tablet Take 2 tablets (2,000 Units total) by mouth daily. 12/14/18   Fields, Sandi L, MD  DULoxetine (CYMBALTA) 60 MG capsule Take 60 mg by mouth daily.    [provider]  famotidine  (PEPCID ) 20 MG tablet Take 1 tablet (20 mg total) by mouth 2 (two) times daily before a meal. 10/17/19   Rudy Josette RAMAN, PA-C  fluticasone  (FLONASE ) 50 MCG/ACT nasal spray Place 1 spray into both nostrils See admin instructions. Instill 1 spray into each nostril every morning, may also use later in the day as needed for sinus congestion    [provider]  HYDROcodone -acetaminophen  (NORCO/VICODIN) 5-325 MG tablet Take 1 tablet by mouth every 6 (six) hours as needed. 05/18/23   [provider]  lamoTRIgine (LAMICTAL) 200 MG tablet Take 400 mg by mouth daily. 02/06/21   [provider]  LINZESS  145 MCG CAPS capsule TAKE (1) CAPSULE BY MOUTH EVERY DAY BEFORE BREAKFAST. 06/20/23   Federico Rosario BROCKS, MD  loratadine  (CLARITIN ) 10 MG tablet Take 10 mg by mouth daily as needed for allergies.    [provider]  Polyvinyl Alcohol -Povidone (REFRESH OP) Place 1 drop into both eyes 3 (three) times daily as needed (dry eyes).    [provider]  Simethicone  (GAS-X PO) Take 1 tablet by mouth 2 (two) times daily as needed (bloating/flatulence).    [provider]  sodium chloride  (OCEAN) 0.65 % nasal spray Place 1 spray into the nose 2 (two) times daily as needed for congestion.    [provider]  spironolactone  (ALDACTONE ) 100 MG tablet TAKE 1/2 TABLET BY MOUTH TWICE DAILY.  01/17/24   Cook, Jayce G, DO  Tofacitinib  Citrate ER (XELJANZ  XR) 11 MG TB24 Take 1 tablet  by mouth daily.    [provider]  XIIDRA  5 % SOLN Apply 1 drop to eye 2 (two) times daily. 09/08/21   [provider]    Family History Family History  Problem Relation Age of Onset   Cirrhosis Mother        nash   COPD Father    Heart disease Father    Asthma Other    Heart disease Other    Colon cancer Neg Hx    Inflammatory bowel disease Neg Hx    Stomach cancer Neg Hx    Esophageal cancer Neg Hx     Social History Social History   Tobacco Use   Smoking status: Former    Current packs/day: 0.00    Average packs/day: 2.0 packs/day for 12.0 years (24.0 ttl pk-yrs)    Types: Cigarettes    Start date: 03/31/1983    Quit date: 03/31/1995    Years since quitting: 28.9   Smokeless tobacco: Never   Tobacco comments:    quit in 1997  Vaping Use   Vaping status: Never Used  Substance Use Topics   Alcohol  use: No    Comment: Bottle of wine a day for years, but quit in 07/2011   Drug use: No     Allergies   Keflex [cephalexin], Omeprazole, Orencia [abatacept], Other, Phenytoin, Lidocaine  viscous hcl, Promethazine  hcl, Farxiga  [dapagliflozin ], and Milk-related compounds   Review of Systems Review of Systems Per HPI  Physical Exam Triage Vital Signs ED Triage Vitals  Encounter Vitals Group     BP 02/19/24 1136 112/71     Girls Systolic BP Percentile --      Girls Diastolic BP Percentile --      Boys Systolic BP Percentile --      Boys Diastolic BP Percentile --      Pulse Rate 02/19/24 1136 75     Resp 02/19/24 1136 16     Temp 02/19/24 1136 98 F (36.7 C)     Temp Source 02/19/24 1136 Oral     SpO2 02/19/24 1136 95 %     Weight --      Height --      Head Circumference --      Peak Flow --      Pain Score 02/19/24 1138 5     Pain Loc --      Pain Education --      Exclude from Growth Chart --    No data found.  Updated Vital Signs BP 112/71    Pulse 75   Temp 98 F (36.7 C) (Oral)   Resp 16   SpO2 95%   Visual Acuity Right Eye Distance:   Left Eye Distance:   Bilateral Distance:    Right Eye Near:   Left Eye Near:    Bilateral Near:     Physical Exam Vitals and nursing note reviewed.  Constitutional:      General: She is not in acute distress.    Appearance: Normal appearance.  HENT:     Head: Normocephalic.  Eyes:     Extraocular Movements: Extraocular movements intact.     Pupils: Pupils are equal, round, and reactive to light.  Cardiovascular:     Rate and Rhythm: Normal rate and regular rhythm.     Pulses: Normal pulses.     Heart sounds: Normal heart sounds.  Pulmonary:     Effort: Pulmonary effort is normal. No respiratory distress.     Breath sounds:  Normal breath sounds. No stridor. No wheezing, rhonchi or rales.  Abdominal:     General: Bowel sounds are normal.     Palpations: Abdomen is soft.     Tenderness: There is no abdominal tenderness. There is no right CVA tenderness or left CVA tenderness.  Musculoskeletal:     Cervical back: Normal range of motion.  Skin:    General: Skin is warm and dry.  Neurological:     General: No focal deficit present.     Mental Status: She is alert and oriented to person, place, and time.  Psychiatric:        Mood and Affect: Mood normal.        Behavior: Behavior normal.      UC Treatments / Results  Labs (all labs ordered are listed, but only abnormal results are displayed) Labs Reviewed  POCT URINE DIPSTICK - Abnormal; Notable for the following components:      Result Value   Clarity, UA cloudy (*)    Spec Grav, UA >=1.030 (*)    Blood, UA trace-intact (*)    Leukocytes, UA Small (1+) (*)    All other components within normal limits  URINE CULTURE    EKG   Radiology No results found.  Procedures Procedures (including critical care time)  Medications Ordered in UC Medications - No data to display  Initial Impression / Assessment and  Plan / UC Course  I have reviewed the triage vital signs and the nursing notes.  Pertinent labs & imaging results that were available during my care of the patient were reviewed by me and considered in my medical decision making (see chart for details).  Urinalysis is negative for nitrites or protein, does show small leukocytes.  Urine culture is pending.  Per review of the patient's last urine culture performed in September, the culture result was negative.  Patient was advised that since she was recently on an antibiotic, it is recommended to receive the culture result to prevent overuse of antibiotics unless necessary.  In the interim, we will treat patient with Pyridium  100 mg.  Supportive care recommendations were provided and discussed with the patient to include fluids, developing a toileting schedule, and avoiding caffeine.  Patient was given indications regarding follow-up.  Patient was advised if the culture result is negative and she continues to experience symptoms, it is recommended that she follow-up with her PCP to discuss further evaluation or referral to urology.  Patient was in agreement with this plan of care and verbalizes understanding.  All questions were answered.  Patient stable for discharge.  Final Clinical Impressions(s) / UC Diagnoses   Final diagnoses:  Dysuria     Discharge Instructions      A urine culture is pending.  You will be contacted when the results of the urine culture are received.  You will also have access to the results via MyChart. Take medication as prescribed. You may take over-the-counter Tylenol  as needed for pain, fever, or general discomfort. Increase your fluid intake.  Try to drink at least 8-10 8 ounce glasses of water  daily. Develop a toileting schedule that will allow you to urinate at least every 2 hours. Avoid caffeine such as tea, soda, or coffee while symptoms persist. If the result of your urine culture is negative and you continue  to experience symptoms, recommend follow-up with your primary care physician for further evaluation and to discuss possible referral to urology. Follow-up as needed.      ED  Prescriptions     Medication Sig Dispense Auth. Provider   phenazopyridine  (PYRIDIUM ) 100 MG tablet Take 1 tablet (100 mg total) by mouth 3 (three) times daily as needed for pain. 10 tablet Leath-Warren, Etta PARAS, NP      PDMP not reviewed this encounter.   Gilmer Etta PARAS, NP 02/19/24 [phone]

## 2024-02-21 ENCOUNTER — Ambulatory Visit (HOSPITAL_COMMUNITY): Payer: Self-pay

## 2024-02-21 ENCOUNTER — Ambulatory Visit: Admitting: Nurse Practitioner

## 2024-02-21 ENCOUNTER — Ambulatory Visit: Payer: Self-pay

## 2024-02-21 VITALS — BP 120/62 | HR 66 | Temp 98.7°F | Ht 68.0 in | Wt 233.0 lb

## 2024-02-21 DIAGNOSIS — R300 Dysuria: Secondary | ICD-10-CM

## 2024-02-21 DIAGNOSIS — N3000 Acute cystitis without hematuria: Secondary | ICD-10-CM

## 2024-02-21 LAB — URINE CULTURE: Culture: 50000 — AB

## 2024-02-21 MED ORDER — NITROFURANTOIN MONOHYD MACRO 100 MG PO CAPS: 100.0000 mg | ORAL_CAPSULE | Freq: Two times a day (BID) | ORAL | 0 refills | Status: AC

## 2024-02-21 NOTE — Progress Notes (Unsigned)
    Acute Office Visit  Subjective:     Patient ID: Deanna Thompson, female    DOB: 03-Oct-1955, 67 y.o.   MRN: [id number]  Chief Complaint  Patient presents with   Dysuria    Pt c/o urinary frequency and painful urination.     Dysuria  Associated symptoms include flank pain, frequency and urgency. Pertinent negatives include no chills, hematuria, nausea or vomiting.   Patient is in today for complaints of burning with urination and increased urine frequency since Saturday. Was seen in urgent care on 02/19/24 for symptoms, and was given pyridium  since patient was on antibiotics earlier this month. Urinalysis and culture was done at urgent care. Symptoms have not improved since taking Pyridium . No new sexual partners, or abnormal vaginal discharge. Endorses right flank pain, and sweating at times.   Review of Systems  Constitutional:  Positive for diaphoresis. Negative for chills, fever and malaise/fatigue.  Cardiovascular:  Negative for chest pain.  Gastrointestinal:  Negative for nausea and vomiting.  Genitourinary:  Positive for dysuria, flank pain, frequency and urgency. Negative for hematuria.      Objective:    Today's Vitals   02/21/24 1435  BP: 120/62  Pulse: 66  Temp: 98.7 F (37.1 C)  SpO2: 95%  Weight: 233 lb (105.7 kg)  Height: 5' 8 (1.727 m)   Body mass index is 35.43 kg/m.   Physical Exam Vitals and nursing note reviewed.  Constitutional:      General: She is not in acute distress.    Appearance: Normal appearance. She is not ill-appearing.  Cardiovascular:     Rate and Rhythm: Normal rate and regular rhythm.     Heart sounds: Normal heart sounds, S1 normal and S2 normal. No murmur heard. Pulmonary:     Effort: Pulmonary effort is normal. No respiratory distress.     Breath sounds: Normal breath sounds. No wheezing.  Abdominal:     Tenderness: There is abdominal tenderness in the right lower quadrant and suprapubic area. There is right CVA tenderness.  There is no left CVA tenderness.  Neurological:     Mental Status: She is alert.  Psychiatric:        Mood and Affect: Mood normal.        Behavior: Behavior normal.        Thought Content: Thought content normal.        Judgment: Judgment normal.   POCT Urine dipstick on 02/19/2024 showed small leukocytes 1+ and trace blood. Negative for nitrates.   Urine Culture from 02/19/2024: 50,000 COLONIES/mL ESCHERICHIA COLI  Confirmed Extended Spectrum Beta-Lactamase Producer (ESBL).  In bloodstream infections from ESBL organisms, carbapenems are preferred over piperacillin/tazobactam. They are shown to have a lower risk of mortality.     Assessment & Plan:  1. Dysuria -See below.  2. Acute cystitis without hematuria (Primary) - nitrofurantoin , macrocrystal-monohydrate, (MACROBID ) 100 MG capsule; Take 1 capsule (100 mg total) by mouth 2 (two) times daily for 7 days.  Dispense: 14 capsule; Refill: 0  -Prescribed Macrobid  per sensitivity results.  -Provided education to patient about preventing UTI's such as wiping front to back, and urinating after sexual activity.  -Gave warning signs, and recommended that if patient experiences fever, persistent vomiting, or back pain that she will need to be evaluated immediately.   Return if symptoms worsen or fail to improve.   Damien KATHEE Pringle, FNP

## 2024-02-21 NOTE — Telephone Encounter (Signed)
 FYI Only or Action Required?: FYI only for provider: appointment scheduled on today.  Patient was last seen in primary care on 11/04/2023 by Cook, Jayce G, DO.  Called Nurse Triage reporting Urinary Frequency.  Symptoms began several weeks ago.  Interventions attempted: Prescription medications: pyridium .  Symptoms are: gradually worsening.  Triage Disposition: See HCP Within 4 Hours (Or PCP Triage)  Patient/caregiver understands and will follow disposition?: Yes, will follow disposition  Copied from CRM #[id number]. Topic: Clinical - Red Word Triage >> Feb 21, 2024  9:19 AM Deanna Thompson wrote: [facility] that prompted transfer to Nurse Triage: Pt calling, reports she has a UTI, was seen in Urgent Care this past Saturday.  Symptoms include Frequent urination, with painful and burning with urination.  Pt requesting an appt with provider for evaluation. Reason for Disposition  [1] SEVERE pain with urination (e.g., excruciating) AND [2] not improved after 2 hours of pain medicine  Answer Assessment - Initial Assessment Questions 1. SEVERITY: How bad is the pain?  (e.g., Scale 1-10; mild, moderate, or severe)     8 2. FREQUENCY: How many times have you had painful urination today?      Every time 3. PATTERN: Is pain present every time you urinate or just sometimes?      Urge to urinate all the time, states that very painful when she does go 4. ONSET: When did the painful urination start?      About sept  5. FEVER: Do you have a fever? If Yes, ask: What is your temperature, how was it measured, and when did it start?     denies 6. PAST UTI: Have you had a urine infection before? If Yes, ask: When was the last time? and What happened that time?      Has been dealing with this since sept 7. CAUSE: What do you think is causing the painful urination?  (e.g., UTI, scratch, Herpes sore)     Unsure, has been told it was a UTI 8. OTHER SYMPTOMS: Do you have any other symptoms?  (e.g., blood in urine, flank pain, genital sores, urgency, vaginal discharge)     denies  Protocols used: Urination Pain - Female-A-AH

## 2024-02-21 NOTE — Telephone Encounter (Signed)
 Appointment scheduled.

## 2024-02-22 ENCOUNTER — Encounter: Payer: Self-pay | Admitting: Nurse Practitioner

## 2024-02-26 ENCOUNTER — Other Ambulatory Visit: Payer: Self-pay | Admitting: Internal Medicine

## 2024-02-28 ENCOUNTER — Ambulatory Visit: Admitting: Podiatry

## 2024-03-06 ENCOUNTER — Ambulatory Visit: Payer: Self-pay

## 2024-03-06 ENCOUNTER — Ambulatory Visit: Admitting: Family Medicine

## 2024-03-06 NOTE — Telephone Encounter (Signed)
 FYI Only or Action Required?: FYI only for provider: appointment scheduled on 03/06/2024  3:50 PM.  Patient was last seen in primary care on 02/21/2024 by Mauro Elveria BROCKS, NP.  Called Nurse Triage reporting Urinary Symptoms.  Symptoms began yesterday.  Interventions attempted: Rest, hydration, or home remedies.  Symptoms are: unchanged.  Triage Disposition: See Physician Within 24 Hours  Patient/caregiver understands and will follow disposition?: Yes  Copied from CRM #[id number]. Topic: Clinical - Red Word Triage >> Mar 06, 2024 10:04 AM Nessti S wrote: [facility] that prompted transfer to Nurse Triage: uti came back after completing meds Reason for Disposition  Urinating more frequently than usual (i.e., frequency) OR new-onset of the feeling of an urgent need to urinate (i.e., urgency)  Answer Assessment - Initial Assessment Questions Patient reports concerns that her UTI has gone away or has come back. Reports having burning and pain with urination yesterday. Reports taking a Pyridium  at the time. States she was having difficulty urinating. Took her diuretics and was able to urinates. Scheduled patient to see PCP today at 3:50 PM  1. SYMPTOM: What's the main symptom you're concerned about? (e.g., frequency, incontinence)     Patient reports yesterday having issues with urination. Patient reports she took her diuretics and then was able to urinate. Patient reports her urine might be slightly cloudy. Reports she took Pyridium  when she took her diuretics. Reports she tried to urinate felt like a bladder infection-burning, pain. Reports symptoms did go away. Patient states she is paranoid that her UTI didn't go away.  2. ONSET: When did the  urinary symptoms  start?     yesterday 3. PAIN: Is there any pain? If Yes, ask: How bad is it? (Scale: 1-10; mild, moderate, severe)     no 4. CAUSE: What do you think is causing the symptoms?     Patient is concerned that her UTI didn't go  away or has come back.  5. OTHER SYMPTOMS: Do you have any other symptoms? (e.g., blood in urine, fever, flank pain, pain with urination)     Slight tingling in pelvic region.  Protocols used: Urinary Symptoms-A-AH

## 2024-03-13 ENCOUNTER — Ambulatory Visit: Admitting: Podiatry

## 2024-03-14 ENCOUNTER — Ambulatory Visit
Admission: EM | Admit: 2024-03-14 | Discharge: 2024-03-14 | Disposition: A | Discharge status: Home / Self Care | Source: Home / Self Care

## 2024-03-14 ENCOUNTER — Encounter: Payer: Self-pay | Admitting: Emergency Medicine

## 2024-03-14 DIAGNOSIS — R399 Unspecified symptoms and signs involving the genitourinary system: Secondary | ICD-10-CM

## 2024-03-14 DIAGNOSIS — Z87440 Personal history of urinary (tract) infections: Secondary | ICD-10-CM

## 2024-03-14 LAB — POCT URINE DIPSTICK
Bilirubin, UA: NEGATIVE
Glucose, UA: NEGATIVE mg/dL
Nitrite, UA: NEGATIVE
POC PROTEIN,UA: 30 — AB
Spec Grav, UA: 1.02 (ref 1.010–1.025)
Urobilinogen, UA: 0.2 U/dL
pH, UA: 5.5 (ref 5.0–8.0)

## 2024-03-14 MED ORDER — PHENAZOPYRIDINE HCL 100 MG PO TABS: 100.0000 mg | ORAL_TABLET | Freq: Three times a day (TID) | ORAL | 0 refills | Status: AC | PRN

## 2024-03-14 MED ORDER — NITROFURANTOIN MONOHYD MACRO 100 MG PO CAPS: 100.0000 mg | ORAL_CAPSULE | Freq: Two times a day (BID) | ORAL | 0 refills | Status: AC

## 2024-03-14 NOTE — ED Triage Notes (Signed)
 Pain on urination and urgency that started this morning.  C/o some lower back pain.  C/o chills.  States took a pyridium  while waiting in the waiting room

## 2024-03-14 NOTE — Discharge Instructions (Signed)
 A urine culture has been ordered.  You will be contacted when the results of the urine culture are received.  If the results of your urine culture are negative and you are continue to experience symptoms, recommend follow-up with your primary care physician for further evaluation. Take medication as prescribed. Increase fluids and allow for plenty of rest.  Try to drink at least 8-10 8 ounce glasses of water  while symptoms persist. You may take over-the-counter Tylenol  as needed for pain or discomfort. Develop a toileting schedule that will allow you to urinate at least every 2 hours. Avoid caffeine such as tea, soda, or coffee while symptoms persist. Please discuss with your primary care physician whether or not referral to urology is indicated. Follow-up as needed.

## 2024-03-14 NOTE — ED Provider Notes (Signed)
 [facility]    CSN: [id number] Arrival date & time: 03/14/24  1209      History   Chief Complaint No chief complaint on file.   HPI Devri  Deanna Thompson is a 68 y.o. female.   The history is provided by the patient.   Patient presents for complaints of pain with urination, urinary urgency, frequency, chills, and low back pain.  Patient states symptoms started this morning upon wakening.  She denies fever, abdominal pain, flank pain, hematuria, decreased urine stream, or vaginal symptoms.  Patient reports that she was treated for UTI last month.  States that she was treated with Macrobid .  Patient reports underlying history of chronic kidney disease.  Also states that she has not been on Farxiga  for almost 2 months.  Past Medical History:  Diagnosis Date   Abdominal hernia    repaired   Abscess of pulp of tooth 12/12/2014   left side   Anemia    Anxiety    Arthritis    back  and down flank  right side more than left   B12 deficiency 06/12/2015   Brain aneurysm    METAL COIL PREVENTS MRI IMAGING and CLIP   Cirrhosis (HCC)    Presumably alcoholic cirrhosis   Cirrhosis (HCC)    Complication of anesthesia    Depression    ETOH abuse    Quit in 07/2011    Fatigue    due to Sjogren's disease   Flat foot (pes planus) (acquired), left foot    Folate deficiency 06/30/2016   GERD (gastroesophageal reflux disease)    HTN (hypertension)    no longer medicated, dated on 04/25/12   IBS (irritable bowel syndrome)    Iron  deficiency anemia due to chronic blood loss    Myofascial pain    Panic attack    PONV (postoperative nausea and vomiting)    Psoriatic arthritis (HCC)    Right inguinal hernia 08/16/2012   Seizures (HCC) 1997   1 seizure from coil insertion but no more seizures and no meds for seizures   Sjogren's disease    soft Bipolar disorder (HCC)    Stroke (HCC) 2019   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   Qualifier: Diagnosis of  By: Margrette MD, T[place]      TIA (transient ischemic attack)    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Vertigo     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   Chronic CHF (congestive heart failure) (HCC) 06/16/2022   Incisional hernia, without obstruction or gangrene 11/10/2021   Bipolar disorder (HCC) 01/13/2021   Hyperlipidemia 01/13/2021   IBS (irritable bowel syndrome)    History of stroke 08/01/2017   Essential hypertension 07/31/2017   GERD (gastroesophageal reflux disease) 07/31/2017   Brain aneurysm 07/31/2017   CKD (chronic kidney disease), stage III (HCC) 07/31/2017   LBBB (left bundle branch block) 07/31/2017   Psoriatic arthritis (HCC) 04/15/2015   Cirrhosis of liver (HCC) 09/19/2014   Iron  deficiency anemia due to chronic blood loss    Insomnia 03/17/2014    Past Surgical History:  Procedure Laterality Date   AGILE CAPSULE N/A 06/21/2014   Procedure: AGILE CAPSULE;  Surgeon: Margo LITTIE Haddock, MD;  [facility]: [facility];  Service: Endoscopy;  Laterality: N/A;  0700   BIOPSY  10/13/2011   SLF: mild gastritis/duodenal polypoid lesion    BIOPSY  06/04/2020   Procedure: BIOPSY;  Surgeon: Cindie Carlin POUR, DO;  Location: [facility];  Service: Endoscopy;;   CEREBRAL ANEURYSM REPAIR  1997   CHOLECYSTECTOMY N/A 10/28/2018   Procedure: LAPAROSCOPIC CHOLECYSTECTOMY WITH INTRAOPERATIVE CHOLANGIOGRAM;  Surgeon: Kallie Manuelita BROCKS, MD;  Location: [facility];  Service: General;  Laterality: N/A;   COLONOSCOPY  10/13/2011   DOQ:[id number] hemorrhoids/varices rectal/lesion in ascending colon(HYPERPLASTIC POLYP)   COLONOSCOPY N/A 01/17/2013   SLF: 1. Internal hemorrhoids 2. Ascending colon polyps 3.  Rectal varices.    COLONOSCOPY N/A 08/10/2016   Dr. Harvey: redundant left colon. external and internal hemorhroids. 5-10 year surveillance   ESOPHAGOGASTRODUODENOSCOPY N/A 06/29/2014   SLF: 1. anemia due to polypoid lesions in teh antrum, and duodenal polyps 2. single duodenum AVM    ESOPHAGOGASTRODUODENOSCOPY (EGD) WITH PROPOFOL  N/A 03/07/2019   Procedure: ESOPHAGOGASTRODUODENOSCOPY (EGD) WITH PROPOFOL ;  Surgeon: Harvey Margo CROME, MD;  [facility]: [facility];  Service: Endoscopy;  Laterality: N/A;  1:00pm   ESOPHAGOGASTRODUODENOSCOPY (EGD) WITH PROPOFOL  N/A 03/16/2019   Procedure: ESOPHAGOGASTRODUODENOSCOPY (EGD) WITH PROPOFOL ;  Surgeon: Harvey Margo CROME, MD;  [facility]: [facility];  Service: Endoscopy;  Laterality: N/A;   ESOPHAGOGASTRODUODENOSCOPY (EGD) WITH PROPOFOL  N/A 06/04/2020   Procedure: ESOPHAGOGASTRODUODENOSCOPY (EGD) WITH PROPOFOL ;  Surgeon: Cindie Carlin POUR, DO;  [facility]: [facility];  Service: Endoscopy;  Laterality: N/A;  am   FLEXIBLE SIGMOIDOSCOPY N/A 01/21/2016   Procedure: FLEXIBLE SIGMOIDOSCOPY;  Surgeon: Margo CROME Harvey, MD;  [facility]: [facility];  Service: Endoscopy;  Laterality: N/A;  245   GIVENS CAPSULE STUDY N/A 06/29/2014   Procedure: GIVENS CAPSULE STUDY;  Surgeon: Margo CROME Harvey, MD;  [facility]: [facility];  Service: Endoscopy;  Laterality: N/A;   GIVENS CAPSULE STUDY N/A 07/13/2014   Procedure: GIVENS CAPSULE STUDY;  Surgeon: Margo CROME Harvey, MD;  [facility]: [facility];  Service: Endoscopy;  Laterality: N/A;  700   HEMORRHOID BANDING N/A 01/21/2016   Procedure: HEMORRHOID BANDING;  Surgeon: Margo CROME Harvey, MD;  [facility]: [facility];  Service: Endoscopy;  Laterality: N/A;   HEMOSTASIS CLIP PLACEMENT  03/16/2019   Procedure: HEMOSTASIS CLIP PLACEMENT;  Surgeon: Harvey Margo CROME, MD;  [facility]: [facility];  Service: Endoscopy;;  x 4   HEMOSTASIS CONTROL  03/16/2019   Procedure: HEMOSTASIS CONTROL;  Surgeon: Harvey Margo CROME, MD;  [facility]: [facility];  Service: Endoscopy;;  injection with epinephine 0.1mg /ml via scope and bipolar cautery with gold probe   INGUINAL HERNIA REPAIR Right 08/28/2013   Procedure: RIGHT NGUINAL HERNIORRHAPHY WITH MESH;  Surgeon: Oneil DELENA Budge, MD;  Location: [facility];  Service: General;  Laterality: Right;    INSERTION OF MESH N/A 06/09/2013   Procedure: INSERTION OF MESH;  Surgeon: Oneil DELENA Budge, MD;  Location: [facility];  Service: General;  Laterality: N/A;   INSERTION OF MESH Right 08/28/2013   Procedure: INSERTION OF MESH;  Surgeon: Oneil DELENA Budge, MD;  Location: [facility];  Service: General;  Laterality: Right;   IR RADIOLOGIST EVAL & MGMT  02/17/2023   PARACENTESIS  Feb 2014   10.7 liters   RIGHT/LEFT HEART CATH AND CORONARY ANGIOGRAPHY N/A 04/22/2023   Procedure: RIGHT/LEFT HEART CATH AND CORONARY ANGIOGRAPHY;  Surgeon: Ladona Heinz, MD;  [facility]: [facility];  Service: Cardiovascular;  Laterality: N/A;   TOOTH EXTRACTION Left 12/12/14   UMBILICAL HERNIA REPAIR N/A 06/09/2013   Procedure: UMBILICAL HERNIORRHAPHY WITH MESH;  Surgeon: Oneil DELENA Budge, MD;  Location: [facility];  Service: General;  Laterality: N/A;    OB History     Gravida  0   Para  0   Term  0   Preterm  0   AB  0   Living  0      SAB  0   IAB  0   Ectopic  0   Multiple  0   Live Births  0            Home Medications    Prior to Admission medications  Medication Sig Start Date End Date Taking? Authorizing Provider  nitrofurantoin , macrocrystal-monohydrate, (MACROBID ) 100 MG capsule Take 1 capsule (100 mg total) by mouth 2 (two) times daily for 7 days. 03/14/24 03/21/24 Yes Leath-Warren, Etta PARAS, NP  phenazopyridine  (PYRIDIUM ) 100 MG tablet Take 1 tablet (100 mg total) by mouth 3 (three) times daily as needed for pain. 03/14/24  Yes Leath-Warren, Etta PARAS, NP  acetaminophen  (TYLENOL ) 500 MG tablet Take 500 mg by mouth every 6 (six) hours as needed for moderate pain (pain score 4-6).    [provider]  atorvastatin  (LIPITOR ) 40 MG tablet TAKE ONE TABLET BY MOUTH ONCE DAILY. 12/24/23   Cook, Jayce G, DO  bumetanide  (BUMEX ) 1 MG tablet TAKE (1) TABLET BY MOUTH TWICE DAILY. 10/21/23   Cook, Jayce G, DO  buPROPion  (WELLBUTRIN  XL) 300 MG 24 hr tablet Take 300 mg by mouth daily. 04/25/22   [provider]  Camphor-Menthol -Methyl Sal (SALONPAS) 3.04-04-08 % PTCH Place 1 patch onto the skin daily as needed (pain.).    [provider]  carvedilol  (COREG ) 3.125 MG tablet Take 1 tablet (3.125 mg total) by mouth 2 (two) times daily. 02/08/24 05/08/24  Alvan Dorn FALCON, MD  DULoxetine (CYMBALTA) 60 MG capsule Take 60 mg by mouth daily.    [provider]  famotidine  (PEPCID ) 20 MG tablet Take 1 tablet (20 mg total) by mouth 2 (two) times daily before a meal. 10/17/19   Rudy Josette RAMAN, PA-C  fluticasone  (FLONASE ) 50 MCG/ACT nasal spray Place 1 spray into both nostrils See admin instructions. Instill 1 spray into each nostril every morning, may also use later in the day as needed for sinus congestion    [provider]  lamoTRIgine (LAMICTAL) 200 MG tablet Take 400 mg by mouth daily. 02/06/21   [provider]  LINZESS  145 MCG CAPS capsule TAKE (1) CAPSULE BY MOUTH EVERY DAY BEFORE BREAKFAST. 02/28/24   May, Deanna J, NP  loratadine  (CLARITIN ) 10 MG tablet Take 10 mg by mouth daily as needed for allergies.    [provider]  Polyvinyl Alcohol -Povidone (REFRESH OP) Place 1 drop into both eyes 3 (three) times daily as needed (dry eyes).    [provider]  Simethicone  (GAS-X PO) Take 1 tablet by mouth 2 (two) times daily as needed (bloating/flatulence).    [provider]  sodium chloride  (OCEAN) 0.65 % nasal spray Place 1 spray into the nose 2 (two) times daily as needed for congestion.    [provider]  spironolactone  (ALDACTONE ) 100 MG tablet TAKE 1/2 TABLET BY MOUTH TWICE DAILY. 01/17/24   Cook, Jayce G, DO  Tofacitinib  Citrate ER (XELJANZ  XR) 11 MG TB24 Take 1 tablet by mouth daily.    [provider]    Family History Family History  Problem Relation Age of Onset   Cirrhosis Mother        nash   COPD Father    Heart disease Father    Asthma Other    Heart disease Other    Colon cancer Neg Hx  Inflammatory bowel disease Neg Hx    Stomach cancer Neg Hx    Esophageal cancer Neg Hx     Social History Social History[1]   Allergies   Keflex [cephalexin], Omeprazole, Orencia [abatacept], Other, Phenytoin, Lidocaine  viscous hcl, Promethazine  hcl, Farxiga  [dapagliflozin ], and Milk-related compounds   Review of Systems Review of Systems Per HPI  Physical Exam Triage Vital Signs ED Triage Vitals  Encounter Vitals Group     BP 03/14/24 1347 136/77     Girls Systolic BP Percentile --      Girls Diastolic BP Percentile --      Boys Systolic BP Percentile --      Boys Diastolic BP Percentile --      Pulse Rate 03/14/24 1347 63     Resp 03/14/24 1347 18     Temp 03/14/24 1347 98.2 F (36.8 C)     Temp Source 03/14/24 1347 Oral     SpO2 03/14/24 1347 96 %     Weight --      Height --      Head Circumference --      Peak Flow --      Pain Score 03/14/24 1349 5     Pain Loc --      Pain Education --      Exclude from Growth Chart --    No data found.  Updated Vital Signs BP 136/77 (BP Location: Right Arm)   Pulse 63   Temp 98.2 F (36.8 C) (Oral)   Resp 18   SpO2 96%   Visual Acuity Right Eye Distance:   Left Eye Distance:   Bilateral Distance:    Right Eye Near:   Left Eye Near:    Bilateral Near:     Physical Exam Vitals and nursing note reviewed.  Constitutional:      General: She is not in acute distress.    Appearance: Normal appearance.  HENT:     Head: Normocephalic.  Eyes:     Extraocular Movements: Extraocular movements intact.     Pupils: Pupils are equal, round, and reactive to light.  Cardiovascular:     Rate and Rhythm: Normal rate and regular rhythm.     Pulses: Normal pulses.     Heart sounds: Normal heart sounds.  Pulmonary:     Effort: Pulmonary effort is normal. No respiratory distress.     Breath sounds: Normal breath sounds. No stridor. No wheezing, rhonchi or rales.  Abdominal:     General: Bowel sounds are normal.      Palpations: Abdomen is soft.     Tenderness: There is abdominal tenderness in the suprapubic area. There is no right CVA tenderness or left CVA tenderness.  Musculoskeletal:     Cervical back: Normal range of motion.  Skin:    General: Skin is warm and dry.  Neurological:     General: No focal deficit present.     Mental Status: She is alert and oriented to person, place, and time.  Psychiatric:        Mood and Affect: Mood normal.        Behavior: Behavior normal.      UC Treatments / Results  Labs (all labs ordered are listed, but only abnormal results are displayed) Labs Reviewed  POCT URINE DIPSTICK - Abnormal; Notable for the following components:      Result Value   Clarity, UA cloudy (*)    Ketones, POC UA trace (5) (*)    Blood, UA  large (*)    POC PROTEIN,UA =30 (*)    Leukocytes, UA Trace (*)    All other components within normal limits    EKG   Radiology No results found.  Procedures Procedures (including critical care time)  Medications Ordered in UC Medications - No data to display  Initial Impression / Assessment and Plan / UC Course  I have reviewed the triage vital signs and the nursing notes.  Pertinent labs & imaging results that were available during my care of the patient were reviewed by me and considered in my medical decision making (see chart for details).  Urinalysis is positive for leukocytes, protein, and blood, because suggestive of a urinary tract infection.  Urine culture is pending.  Reviewed the patient's last urine culture result, bacteria causing symptoms as E. coli; however, culture shows bacteria was resistant to Cipro  and Bactrim .  Patient was treated with Macrobid  last month.  Given the patient's allergies, underlying history of kidney disease, and previous culture results, we will treat with Macrobid  100 mg.  Symptomatic treatment provided with Pyridium  100 mg.  Supportive care recommendations were provided and discussed with the  patient to include fluids, rest, over-the-counter analgesics, developing a toileting schedule, and avoiding caffeine.  Patient was given indications regarding ER follow-up.  Patient also advised to discuss whether or not referral to urology is indicated with her PCP.  Patient was in agreement with this plan of care and verbalizes understanding.  All questions were answered.  Patient stable for discharge.   Final Clinical Impressions(s) / UC Diagnoses   Final diagnoses:  UTI symptoms     Discharge Instructions      A urine culture has been ordered.  You will be contacted when the results of the urine culture are received.  If the results of your urine culture are negative and you are continue to experience symptoms, recommend follow-up with your primary care physician for further evaluation. Take medication as prescribed. Increase fluids and allow for plenty of rest.  Try to drink at least 8-10 8 ounce glasses of water  while symptoms persist. You may take over-the-counter Tylenol  as needed for pain or discomfort. Develop a toileting schedule that will allow you to urinate at least every 2 hours. Avoid caffeine such as tea, soda, or coffee while symptoms persist. Please discuss with your primary care physician whether or not referral to urology is indicated. Follow-up as needed.     ED Prescriptions     Medication Sig Dispense Auth. Provider   nitrofurantoin , macrocrystal-monohydrate, (MACROBID ) 100 MG capsule Take 1 capsule (100 mg total) by mouth 2 (two) times daily for 7 days. 14 capsule Leath-Warren, Etta PARAS, NP   phenazopyridine  (PYRIDIUM ) 100 MG tablet Take 1 tablet (100 mg total) by mouth 3 (three) times daily as needed for pain. 10 tablet Leath-Warren, Etta PARAS, NP      PDMP not reviewed this encounter.    [1]  Social History Tobacco Use   Smoking status: Former    Current packs/day: 0.00    Average packs/day: 2.0 packs/day for 12.0 years (24.0 ttl pk-yrs)     Types: Cigarettes    Start date: 03/31/1983    Quit date: 03/31/1995    Years since quitting: 28.9   Smokeless tobacco: Never   Tobacco comments:    quit in 1997  Vaping Use   Vaping status: Never Used  Substance Use Topics   Alcohol  use: No    Comment: Bottle of wine a day for years,  but quit in 07/2011   Drug use: No     Leath-Warren, Etta PARAS, NP 03/14/24 1435

## 2024-03-15 ENCOUNTER — Ambulatory Visit: Admitting: Family Medicine

## 2024-03-16 LAB — URINE CULTURE: Culture: 40000 — AB

## 2024-03-28 ENCOUNTER — Ambulatory Visit
Admission: EM | Admit: 2024-03-28 | Discharge: 2024-03-28 | Disposition: A | Discharge status: Home / Self Care | Source: Home / Self Care

## 2024-03-28 ENCOUNTER — Encounter: Payer: Self-pay | Admitting: Emergency Medicine

## 2024-03-28 DIAGNOSIS — R399 Unspecified symptoms and signs involving the genitourinary system: Secondary | ICD-10-CM | POA: Diagnosis not present

## 2024-03-28 LAB — POCT URINE DIPSTICK
Bilirubin, UA: NEGATIVE
Glucose, UA: NEGATIVE mg/dL
Ketones, POC UA: NEGATIVE mg/dL
Nitrite, UA: NEGATIVE
POC PROTEIN,UA: NEGATIVE
Spec Grav, UA: <=1.005 — AB
Urobilinogen, UA: 0.2 U/dL
pH, UA: 7

## 2024-03-28 MED ORDER — SULFAMETHOXAZOLE-TRIMETHOPRIM 800-160 MG PO TABS: 1.0000 | ORAL_TABLET | Freq: Two times a day (BID) | ORAL | 0 refills | Status: AC

## 2024-03-28 NOTE — ED Triage Notes (Signed)
 Pt c/o lower abd pains, back pains and dysuria that started today. Also having constant urge to urinate.

## 2024-03-28 NOTE — ED Provider Notes (Signed)
 " [facility]    CSN: [id number] Arrival date & time: 03/28/24  1728      History   Chief Complaint Chief Complaint  Patient presents with   Dysuria    HPI Deanna  Thompson is a 68 y.o. female.   The history is provided by the patient.   Patient presents for complaints of pain with urination, low back pain, urgency, and suprapubic pain that started today.  Patient has a significant history of recurrent urinary tract infections, last UTI was approximately 2 weeks ago.  She denies fever, chills, abdominal pain, flank pain, decreased urine stream, hematuria, or vaginal symptoms.  Patient reports history of underlying chronic kidney disease.  Patient would also like a referral to urology.  Past Medical History:  Diagnosis Date   Abdominal hernia    repaired   Abscess of pulp of tooth 12/12/2014   left side   Anemia    Anxiety    Arthritis    back  and down flank  right side more than left   B12 deficiency 06/12/2015   Brain aneurysm    METAL COIL PREVENTS MRI IMAGING and CLIP   Cirrhosis (HCC)    Presumably alcoholic cirrhosis   Cirrhosis (HCC)    Complication of anesthesia    Depression    ETOH abuse    Quit in 07/2011    Fatigue    due to Sjogren's disease   Flat foot (pes planus) (acquired), left foot    Folate deficiency 06/30/2016   GERD (gastroesophageal reflux disease)    HTN (hypertension)    no longer medicated, dated on 04/25/12   IBS (irritable bowel syndrome)    Iron  deficiency anemia due to chronic blood loss    Myofascial pain    Panic attack    PONV (postoperative nausea and vomiting)    Psoriatic arthritis (HCC)    Right inguinal hernia 08/16/2012   Seizures (HCC) 1997   1 seizure from coil insertion but no more seizures and no meds for seizures   Sjogren's disease    soft Bipolar disorder (HCC)    Stroke (HCC) 2019   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   Qualifier: Diagnosis of  By: Margrette MD, T[place]     TIA (transient ischemic  attack)    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Vertigo     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   Chronic CHF (congestive heart failure) (HCC) 06/16/2022   Incisional hernia, without obstruction or gangrene 11/10/2021   Bipolar disorder (HCC) 01/13/2021   Hyperlipidemia 01/13/2021   IBS (irritable bowel syndrome)    History of stroke 08/01/2017   Essential hypertension 07/31/2017   GERD (gastroesophageal reflux disease) 07/31/2017   Brain aneurysm 07/31/2017   CKD (chronic kidney disease), stage III (HCC) 07/31/2017   LBBB (left bundle branch block) 07/31/2017   Psoriatic arthritis (HCC) 04/15/2015   Cirrhosis of liver (HCC) 09/19/2014   Iron  deficiency anemia due to chronic blood loss    Insomnia 03/17/2014    Past Surgical History:  Procedure Laterality Date   AGILE CAPSULE N/A 06/21/2014   Procedure: AGILE CAPSULE;  Surgeon: Margo LITTIE Haddock, MD;  [facility];  Service: Endoscopy;  Laterality: N/A;  0700   BIOPSY  10/13/2011   SLF: mild gastritis/duodenal polypoid lesion    BIOPSY  06/04/2020   Procedure: BIOPSY;  Surgeon: Cindie Carlin POUR, DO;  [facility]: [facility];  Service: Endoscopy;;   CEREBRAL  ANEURYSM REPAIR  1997   CHOLECYSTECTOMY N/A 10/28/2018   Procedure: LAPAROSCOPIC CHOLECYSTECTOMY WITH INTRAOPERATIVE CHOLANGIOGRAM;  Surgeon: Kallie Manuelita BROCKS, MD;  Location: [facility];  Service: General;  Laterality: N/A;   COLONOSCOPY  10/13/2011   DOQ:[id number] hemorrhoids/varices rectal/lesion in ascending colon(HYPERPLASTIC POLYP)   COLONOSCOPY N/A 01/17/2013   SLF: 1. Internal hemorrhoids 2. Ascending colon polyps 3.  Rectal varices.    COLONOSCOPY N/A 08/10/2016   Dr. Harvey: redundant left colon. external and internal hemorhroids. 5-10 year surveillance   ESOPHAGOGASTRODUODENOSCOPY N/A 06/29/2014   SLF: 1. anemia due to polypoid lesions in teh antrum, and duodenal polyps 2. single duodenum AVM    ESOPHAGOGASTRODUODENOSCOPY (EGD) WITH PROPOFOL  N/A 03/07/2019   Procedure: ESOPHAGOGASTRODUODENOSCOPY (EGD) WITH PROPOFOL ;  Surgeon: Harvey Margo CROME, MD;  [facility];  Service: Endoscopy;  Laterality: N/A;  1:00pm   ESOPHAGOGASTRODUODENOSCOPY (EGD) WITH PROPOFOL  N/A 03/16/2019   Procedure: ESOPHAGOGASTRODUODENOSCOPY (EGD) WITH PROPOFOL ;  Surgeon: Harvey Margo CROME, MD;  [facility];  Service: Endoscopy;  Laterality: N/A;   ESOPHAGOGASTRODUODENOSCOPY (EGD) WITH PROPOFOL  N/A 06/04/2020   Procedure: ESOPHAGOGASTRODUODENOSCOPY (EGD) WITH PROPOFOL ;  Surgeon: Cindie Carlin POUR, DO;  [facility];  Service: Endoscopy;  Laterality: N/A;  am   FLEXIBLE SIGMOIDOSCOPY N/A 01/21/2016   Procedure: FLEXIBLE SIGMOIDOSCOPY;  Surgeon: Margo CROME Harvey, MD;  [facility];  Service: Endoscopy;  Laterality: N/A;  245   GIVENS CAPSULE STUDY N/A 06/29/2014   Procedure: GIVENS CAPSULE STUDY;  Surgeon: Margo CROME Harvey, MD;  [facility];  Service: Endoscopy;  Laterality: N/A;   GIVENS CAPSULE STUDY N/A 07/13/2014   Procedure: GIVENS CAPSULE STUDY;  Surgeon: Margo CROME Harvey, MD;  [facility];  Service: Endoscopy;  Laterality: N/A;  700   HEMORRHOID BANDING N/A 01/21/2016   Procedure: HEMORRHOID BANDING;  Surgeon: Margo CROME Harvey, MD;  [facility];  Service: Endoscopy;  Laterality: N/A;   HEMOSTASIS CLIP PLACEMENT  03/16/2019   Procedure: HEMOSTASIS CLIP PLACEMENT;  Surgeon: Harvey Margo CROME, MD;  [facility];  Service: Endoscopy;;  x 4   HEMOSTASIS CONTROL  03/16/2019   Procedure: HEMOSTASIS CONTROL;  Surgeon: Harvey Margo CROME, MD;  [facility];  Service: Endoscopy;;  injection with epinephine 0.1mg /ml via scope and bipolar cautery with gold probe   INGUINAL HERNIA REPAIR Right 08/28/2013   Procedure: RIGHT NGUINAL HERNIORRHAPHY WITH MESH;  Surgeon: Oneil DELENA Budge, MD;  Location: [facility];  Service: General;  Laterality: Right;    INSERTION OF MESH N/A 06/09/2013   Procedure: INSERTION OF MESH;  Surgeon: Oneil DELENA Budge, MD;  Location: [facility];  Service: General;  Laterality: N/A;   INSERTION OF MESH Right 08/28/2013   Procedure: INSERTION OF MESH;  Surgeon: Oneil DELENA Budge, MD;  Location: [facility];  Service: General;  Laterality: Right;   IR RADIOLOGIST EVAL & MGMT  02/17/2023   PARACENTESIS  Feb 2014   10.7 liters   RIGHT/LEFT HEART CATH AND CORONARY ANGIOGRAPHY N/A 04/22/2023   Procedure: RIGHT/LEFT HEART CATH AND CORONARY ANGIOGRAPHY;  Surgeon: Ladona Heinz, MD;  [facility];  Service: Cardiovascular;  Laterality: N/A;   TOOTH EXTRACTION Left 12/12/14   UMBILICAL HERNIA REPAIR N/A 06/09/2013   Procedure: UMBILICAL HERNIORRHAPHY WITH MESH;  Surgeon: Oneil DELENA Budge, MD;  Location: [facility];  Service: General;  Laterality: N/A;    OB History     Gravida  0   Para  0   Term  0   Preterm  0   AB  0   Living  0      SAB  0   IAB  0   Ectopic  0   Multiple  0   Live Births  0            Home Medications    Prior to Admission medications  Medication Sig Start Date End Date Taking? Authorizing Provider  acetaminophen  (TYLENOL ) 500 MG tablet Take 500 mg by mouth every 6 (six) hours as needed for moderate pain (pain score 4-6).    [provider]  atorvastatin  (LIPITOR ) 40 MG tablet TAKE ONE TABLET BY MOUTH ONCE DAILY. 12/24/23   Cook, Jayce G, DO  bumetanide  (BUMEX ) 1 MG tablet TAKE (1) TABLET BY MOUTH TWICE DAILY. 10/21/23   Cook, Jayce G, DO  buPROPion  (WELLBUTRIN  XL) 300 MG 24 hr tablet Take 300 mg by mouth daily. 04/25/22   [provider]  Camphor-Menthol -Methyl Sal (SALONPAS) 3.04-04-08 % PTCH Place 1 patch onto the skin daily as needed (pain.).    [provider]  carvedilol  (COREG ) 3.125 MG tablet Take 1 tablet (3.125 mg total) by mouth 2 (two) times daily. 02/08/24 05/08/24  Alvan Dorn FALCON, MD  DULoxetine (CYMBALTA) 60 MG capsule Take 60 mg by mouth daily.     [provider]  famotidine  (PEPCID ) 20 MG tablet Take 1 tablet (20 mg total) by mouth 2 (two) times daily before a meal. 10/17/19   Rudy Josette RAMAN, PA-C  fluticasone  (FLONASE ) 50 MCG/ACT nasal spray Place 1 spray into both nostrils See admin instructions. Instill 1 spray into each nostril every morning, may also use later in the day as needed for sinus congestion    [provider]  lamoTRIgine (LAMICTAL) 200 MG tablet Take 400 mg by mouth daily. 02/06/21   [provider]  LINZESS  145 MCG CAPS capsule TAKE (1) CAPSULE BY MOUTH EVERY DAY BEFORE BREAKFAST. 02/28/24   May, Deanna J, NP  loratadine  (CLARITIN ) 10 MG tablet Take 10 mg by mouth daily as needed for allergies.    [provider]  phenazopyridine  (PYRIDIUM ) 100 MG tablet Take 1 tablet (100 mg total) by mouth 3 (three) times daily as needed for pain. 03/14/24   Leath-Warren, Etta PARAS, NP  Polyvinyl Alcohol -Povidone (REFRESH OP) Place 1 drop into both eyes 3 (three) times daily as needed (dry eyes).    [provider]  Simethicone  (GAS-X PO) Take 1 tablet by mouth 2 (two) times daily as needed (bloating/flatulence).    [provider]  sodium chloride  (OCEAN) 0.65 % nasal spray Place 1 spray into the nose 2 (two) times daily as needed for congestion.    [provider]  spironolactone  (ALDACTONE ) 100 MG tablet TAKE 1/2 TABLET BY MOUTH TWICE DAILY. 01/17/24   Cook, Jayce G, DO  Tofacitinib  Citrate ER (XELJANZ  XR) 11 MG TB24 Take 1 tablet by mouth daily.    [provider]    Family History Family History  Problem Relation Age of Onset   Cirrhosis Mother        nash   COPD Father    Heart disease Father    Asthma Other    Heart disease Other    Colon cancer Neg Hx    Inflammatory bowel disease Neg Hx    Stomach cancer Neg Hx    Esophageal cancer Neg Hx     Social History Social History[1]   Allergies   Keflex [cephalexin], Omeprazole,  Orencia  [abatacept], Other, Phenytoin, Lidocaine  viscous hcl, Promethazine  hcl, Farxiga  [dapagliflozin ], and Milk-related compounds   Review of Systems Review of Systems Per HPI  Physical Exam Triage Vital Signs ED Triage Vitals  Encounter Vitals Group     BP 03/28/24 1743 (!) 144/85     Girls Systolic BP Percentile --      Girls Diastolic BP Percentile --      Boys Systolic BP Percentile --      Boys Diastolic BP Percentile --      Pulse Rate 03/28/24 1743 65     Resp 03/28/24 1743 17     Temp 03/28/24 1743 98.2 F (36.8 C)     Temp Source 03/28/24 1743 Oral     SpO2 03/28/24 1743 98 %     Weight --      Height --      Head Circumference --      Peak Flow --      Pain Score 03/28/24 1742 8     Pain Loc --      Pain Education --      Exclude from Growth Chart --    No data found.  Updated Vital Signs BP (!) 144/85 (BP Location: Right Arm)   Pulse 65   Temp 98.2 F (36.8 C) (Oral)   Resp 17   SpO2 98%   Visual Acuity Right Eye Distance:   Left Eye Distance:   Bilateral Distance:    Right Eye Near:   Left Eye Near:    Bilateral Near:     Physical Exam Vitals and nursing note reviewed.  Constitutional:      General: She is not in acute distress.    Appearance: Normal appearance.  HENT:     Head: Normocephalic.  Eyes:     Extraocular Movements: Extraocular movements intact.     Pupils: Pupils are equal, round, and reactive to light.  Cardiovascular:     Rate and Rhythm: Normal rate and regular rhythm.     Pulses: Normal pulses.     Heart sounds: Normal heart sounds.  Pulmonary:     Effort: Pulmonary effort is normal. No respiratory distress.     Breath sounds: Normal breath sounds. No stridor. No wheezing, rhonchi or rales.  Abdominal:     General: Bowel sounds are normal.     Palpations: Abdomen is soft.     Tenderness: There is abdominal tenderness in the suprapubic area. There is no right CVA tenderness or left CVA tenderness.  Musculoskeletal:      Cervical back: Normal range of motion.  Skin:    General: Skin is warm and dry.  Neurological:     General: No focal deficit present.     Mental Status: She is alert and oriented to person, place, and time.  Psychiatric:        Mood and Affect: Mood normal.        Behavior: Behavior normal.      UC Treatments / Results  Labs (all labs ordered are listed, but only abnormal results are displayed) Labs Reviewed  POCT URINE DIPSTICK - Abnormal; Notable for the following components:      Result Value   Clarity, UA cloudy (*)    Spec Grav, UA <=1.005 (*)    Blood, UA trace-intact (*)    Leukocytes, UA Moderate (2+) (*)    All other components within normal limits  URINE CULTURE    EKG   Radiology No results found.  Procedures Procedures (including critical care time)  Medications Ordered in UC Medications - No data to display  Initial Impression / Assessment and Plan / UC Course  I have reviewed the triage vital signs and the nursing notes.  Pertinent labs & imaging results that were available during my care of the patient were reviewed by me and considered in my medical decision making (see chart for details).  Urinalysis is positive for moderate leukocytes and blood, consistent for possible urinary tract infection.  Urine culture is pending.  In the interim, we will treat with Bactrim  DS 800/160 mg (reviewed patient's most recent GFR dated 07/23/2023, GFR 38, no adjustments needed per up-to-date). Symptomatic treatment provided with Pyridium  100 mg. Supportive care recommendations were provided and discussed with the patient to include fluids, rest, over-the-counter analgesics, developing a toileting schedule, and avoiding caffeine. Patient was given indications regarding ER follow-up.  Referral was made to urology on the patient's may have for further evaluation of recurrent urinary tract infection.  Patient was in agreement with this plan of care and verbalizes understanding.  All questions were answered. Patient stable for discharge.    Final Clinical Impressions(s) / UC Diagnoses   Final diagnoses:  UTI symptoms   Discharge Instructions   None    ED Prescriptions   None    PDMP not reviewed this encounter.    [1]  Social History Tobacco Use   Smoking status: Former    Current packs/day: 0.00    Average packs/day: 2.0 packs/day for 12.0 years (24.0 ttl pk-yrs)    Types: Cigarettes    Start date: 03/31/1983    Quit date: 03/31/1995    Years since quitting: 29.0   Smokeless tobacco: Never   Tobacco comments:    quit in 1997  Vaping Use   Vaping status: Never Used  Substance Use Topics   Alcohol  use: No    Comment: Bottle of wine a day for years, but quit in 07/2011   Drug use: No     Gilmer Etta PARAS, NP 03/28/24 1808  "

## 2024-03-28 NOTE — Discharge Instructions (Addendum)
 A urine culture has been ordered.  You will be contacted when the results of the urine culture are received.  If the results of your urine culture are negative and you are continue to experience symptoms, recommend follow-up with your primary care physician for further evaluation. Take medication as prescribed. Increase fluids and allow for plenty of rest.  Try to drink at least 8-10 8 ounce glasses of water  while symptoms persist. You may take over-the-counter Tylenol  as needed for pain or discomfort. Develop a toileting schedule that will allow you to urinate at least every 2 hours. Avoid caffeine such as tea, soda, or coffee while symptoms persist. A referral for urology has been placed.  Please call the office to schedule an appointment. Follow-up as needed.

## 2024-03-31 ENCOUNTER — Ambulatory Visit (HOSPITAL_COMMUNITY): Payer: Self-pay

## 2024-03-31 LAB — URINE CULTURE: Culture: 40000 — AB

## 2024-03-31 NOTE — Progress Notes (Signed)
" ° ° °  Acute Office Visit  Subjective:     Patient ID: Deanna Thompson  [place], female    DOB: 1955/07/09, 69 y.o.   MRN: [id number]  Chief Complaint  Patient presents with   Dysuria    Pt c/o urinary frequency and painful urination.     Dysuria  Associated symptoms include flank pain, frequency and urgency. Pertinent negatives include no chills, hematuria, nausea or vomiting.   Patient is in today for complaints of burning with urination and increased urine frequency since Saturday. Was seen in urgent care on 02/19/24 for symptoms, and was given pyridium  since patient was on antibiotics earlier this month. Urinalysis and culture was done at urgent care. Symptoms have not improved since taking Pyridium . No new sexual partners, or abnormal vaginal discharge. Endorses right flank pain, and sweating at times.   Review of Systems  Constitutional:  Positive for diaphoresis. Negative for chills, fever and malaise/fatigue.  Cardiovascular:  Negative for chest pain.  Gastrointestinal:  Negative for nausea and vomiting.  Genitourinary:  Positive for dysuria, flank pain, frequency and urgency. Negative for hematuria.      Objective:    Today's Vitals   02/21/24 1435  BP: 120/62  Pulse: 66  Temp: 98.7 F (37.1 C)  SpO2: 95%  Weight: 233 lb (105.7 kg)  Height: 5' 8 (1.727 m)   Body mass index is 35.43 kg/m.   Physical Exam Vitals and nursing note reviewed.  Constitutional:      General: She is not in acute distress.    Appearance: Normal appearance. She is not ill-appearing.  Cardiovascular:     Rate and Rhythm: Normal rate and regular rhythm.     Heart sounds: Normal heart sounds, S1 normal and S2 normal. No murmur heard. Pulmonary:     Effort: Pulmonary effort is normal. No respiratory distress.     Breath sounds: Normal breath sounds. No wheezing.  Abdominal:     Tenderness: There is abdominal tenderness in the right lower quadrant and suprapubic area. There is right CVA tenderness.  There is no left CVA tenderness.  Neurological:     Mental Status: She is alert.  Psychiatric:        Mood and Affect: Mood normal.        Behavior: Behavior normal.        Thought Content: Thought content normal.        Judgment: Judgment normal.   POCT Urine dipstick on 02/19/2024 showed small leukocytes 1+ and trace blood. Negative for nitrates.   Urine Culture from 02/19/2024: 50,000 COLONIES/mL ESCHERICHIA COLI     Assessment & Plan:  1. Dysuria -See below.  2. Acute cystitis without hematuria (Primary) - nitrofurantoin , macrocrystal-monohydrate, (MACROBID ) 100 MG capsule; Take 1 capsule (100 mg total) by mouth 2 (two) times daily for 7 days.  Dispense: 14 capsule; Refill: 0  -Prescribed Macrobid  per sensitivity results.  -Provided education to patient about preventing UTI's such as wiping front to back, and urinating after sexual activity.  -Gave warning signs, and recommended that if patient experiences fever, persistent vomiting, or back pain that she will need to be evaluated immediately.  - Continue Pyridium  for 48 hours then DC.  - Increase clear fluid intake.   Return if symptoms worsen or fail to improve.   Damien KATHEE Pringle, FNP  Addendum:  Patient was seen at [facility] under the supervision of Elveria Quarry, NP on 02/28/2024. Originally entered in error.   Thanks, Damien Pringle, FNP-BC  "

## 2024-04-05 ENCOUNTER — Encounter: Payer: Self-pay | Admitting: Family Medicine

## 2024-04-05 ENCOUNTER — Ambulatory Visit: Admitting: Podiatry

## 2024-04-05 ENCOUNTER — Ambulatory Visit: Admitting: Family Medicine

## 2024-04-05 VITALS — BP 142/79 | HR 76 | Temp 97.1°F | Ht 68.0 in | Wt 228.8 lb

## 2024-04-05 DIAGNOSIS — I5042 Chronic combined systolic (congestive) and diastolic (congestive) heart failure: Secondary | ICD-10-CM

## 2024-04-05 DIAGNOSIS — L603 Nail dystrophy: Secondary | ICD-10-CM

## 2024-04-05 DIAGNOSIS — F319 Bipolar disorder, unspecified: Secondary | ICD-10-CM

## 2024-04-05 DIAGNOSIS — R42 Dizziness and giddiness: Secondary | ICD-10-CM

## 2024-04-05 DIAGNOSIS — I5020 Unspecified systolic (congestive) heart failure: Secondary | ICD-10-CM

## 2024-04-05 DIAGNOSIS — K7030 Alcoholic cirrhosis of liver without ascites: Secondary | ICD-10-CM | POA: Diagnosis not present

## 2024-04-05 DIAGNOSIS — M19072 Primary osteoarthritis, left ankle and foot: Secondary | ICD-10-CM | POA: Diagnosis not present

## 2024-04-05 DIAGNOSIS — N1832 Chronic kidney disease, stage 3b: Secondary | ICD-10-CM | POA: Diagnosis not present

## 2024-04-05 DIAGNOSIS — R296 Repeated falls: Secondary | ICD-10-CM | POA: Diagnosis not present

## 2024-04-05 DIAGNOSIS — N390 Urinary tract infection, site not specified: Secondary | ICD-10-CM | POA: Diagnosis not present

## 2024-04-05 DIAGNOSIS — B351 Tinea unguium: Secondary | ICD-10-CM | POA: Diagnosis not present

## 2024-04-05 MED ORDER — TRIAMCINOLONE ACETONIDE 40 MG/ML IJ SUSP
20.0000 mg | Freq: Once | INTRAMUSCULAR | Status: AC
  Administered 2024-04-05: 20 mg

## 2024-04-05 MED ORDER — ESTRADIOL 0.01 % VA CREA: TOPICAL_CREAM | VAGINAL | 12 refills | Status: AC

## 2024-04-05 NOTE — Assessment & Plan Note (Signed)
 Empiric Estrace .  Has appointment with urology.

## 2024-04-05 NOTE — Patient Instructions (Signed)
 Referral to PT placed.  Medication sent in.  Take care  Dr. Bluford

## 2024-04-05 NOTE — Progress Notes (Signed)
 She presents today for a follow-up of her last visit August 2025.  She had Achilles tendinitis and fasciitis which is completely resolved in the right foot at this point.  States that she felt great in her left foot for quite some time.  But has recently started to come back.  She is also concerned about onychomycosis to the hallux right.  Objective: Vital signs are stable alert and oriented x 3.  Pulses are palpable.  She has pain on palpation and on end range of motion of the subtalar joint and the sinus tarsi.  She has no pain on palpation of the Achilles tendon or this plantar fascial region of the right foot.  Assessment: Resolving plantar fasciitis Achilles tendinitis right.  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  Nail dystrophy hallux right  Plan: Injected the sinus tarsi once again today Kenalog  and local anesthetic discussed appropriate shoe gear stretching exercises ice therapy and shoe gear modifications.  Samples of the nail were taken today to be sent for pathologic evaluation follow-up with her in 1 months

## 2024-04-05 NOTE — Assessment & Plan Note (Addendum)
 She is currently euvolemic.  Follows closely with cardiology.  Last EF was severely reduced at 20-25%.

## 2024-04-05 NOTE — Assessment & Plan Note (Signed)
 Has been relatively stable.  Will continue to monitor.

## 2024-04-05 NOTE — Patient Instructions (Signed)
 Care for

## 2024-04-05 NOTE — Assessment & Plan Note (Signed)
 Stable.  Follows with GI.

## 2024-04-05 NOTE — Assessment & Plan Note (Signed)
Referring for PT.

## 2024-04-05 NOTE — Assessment & Plan Note (Signed)
 Stable currently.  Follows with [facility].

## 2024-04-05 NOTE — Progress Notes (Signed)
 "  Subjective:  Patient ID: Deanna  Thompson, female    DOB: [birth year]  Age: 69 y.o. MRN: [id number]  CC:   Chief Complaint  Patient presents with   Acute Visit    Falls more frequently - 4 falls in the past 3 months  will be walking and randomly stagger to once side, right knee pain with bruising and cuts. C/O possible toe fungue on right big toe. Patient wants to talk about bladder infections, stated she was took off Bernadine has that was causing the infections but has since had one on 11/24 - 12/16 - 12/30, finishes medicaiton and infection comes back within a week, appt with urology not until March.    HPI:  69 year old female presents for evaluation of the above.  Patient reports that she is having more difficulty with frequent falls.  Mostly seem to be mechanical fall but she does endorse some intermittent dizziness and disequilibrium.  Has had prior negative head CT.  Will discuss today.  Patient concerned about onychomycosis left great toe.  Also seems to affect the left thumb.  No pain.  Patient also reports recurrent UTI.  Has had 3 UTIs since November.  She has a referral to urology in place but her appointment is not until March.  She would like to discuss this with me today.   Patient Active Problem List   Diagnosis Date Noted   Recurrent falls 04/05/2024   Recurrent UTI 04/05/2024   Onychomycosis 04/05/2024   Chronic CHF (congestive heart failure) (HCC) 06/16/2022   Incisional hernia, without obstruction or gangrene 11/10/2021   Bipolar disorder (HCC) 01/13/2021   Hyperlipidemia 01/13/2021   IBS (irritable bowel syndrome)    History of stroke 08/01/2017   Essential hypertension 07/31/2017   GERD (gastroesophageal reflux disease) 07/31/2017   Brain aneurysm 07/31/2017   CKD stage 3b, GFR 30-44 ml/min (HCC) 07/31/2017   LBBB (left bundle branch block) 07/31/2017   Cirrhosis of liver (HCC) 09/19/2014   Iron  deficiency anemia due to chronic blood loss    Insomnia  03/17/2014    Social Hx   Social History   Socioeconomic History   Marital status: Married    Spouse name: Transport Planner   Number of children: 0   Years of education: Not on file   Highest education level: Master's degree (e.g., MA, MS, MEng, MEd, MSW, MBA)  Occupational History    Employer: RETIRED  Tobacco Use   Smoking status: Former    Current packs/day: 0.00    Average packs/day: 2.0 packs/day for 12.0 years (24.0 ttl pk-yrs)    Types: Cigarettes    Start date: 03/31/1983    Quit date: 03/31/1995    Years since quitting: 29.0   Smokeless tobacco: Never   Tobacco comments:    quit in 1997  Vaping Use   Vaping status: Never Used  Substance and Sexual Activity   Alcohol  use: No    Comment: Bottle of wine a day for years, but quit in 07/2011   Drug use: No   Sexual activity: Not Currently    Birth control/protection: Post-menopausal  Other Topics Concern   Not on file  Social History Narrative   Lives   Caffeine use:    Quit drinking alcohol  3 YEARS ago. No drugs, no IVDU.    Social Drivers of Health   Tobacco Use: Medium Risk (02/22/2024)   Patient History    Smoking Tobacco Use: Former    Smokeless Tobacco Use: Never    Passive Exposure: Not  on file  Financial Resource Strain: Low Risk (02/21/2024)   Overall Financial Resource Strain (CARDIA)    Difficulty of Paying Living Expenses: Not hard at all  Food Insecurity: No Food Insecurity (02/21/2024)   Epic    Worried About Programme Researcher, Broadcasting/film/video in the Last Year: Never true    Ran Out of Food in the Last Year: Never true  Transportation Needs: No Transportation Needs (02/21/2024)   Epic    Lack of Transportation (Medical): No    Lack of Transportation (Non-Medical): No  Physical Activity: Insufficiently Active (02/21/2024)   Exercise Vital Sign    Days of Exercise per Week: 2 days    Minutes of Exercise per Session: 20 min  Stress: Stress Concern Present (02/21/2024)   [employer] of Occupational Health -  Occupational Stress Questionnaire    Feeling of Stress: To some extent  Social Connections: Moderately Isolated (02/21/2024)   Social Connection and Isolation Panel    Frequency of Communication with Friends and Family: More than three times a week    Frequency of Social Gatherings with Friends and Family: Once a week    Attends Religious Services: Never    Database Administrator or Organizations: No    Attends Engineer, Structural: Not on file    Marital Status: Married  Depression (PHQ2-9): Medium Risk (02/21/2024)   Depression (PHQ2-9)    PHQ-2 Score: 8  Alcohol  Screen: Low Risk (07/21/2022)   Alcohol  Screen    Last Alcohol  Screening Score (AUDIT): 0  Housing: Low Risk (02/21/2024)   Epic    Unable to Pay for Housing in the Last Year: No    Number of Times Moved in the Last Year: 0    Homeless in the Last Year: No  Utilities: Not At Risk (07/21/2022)   AHC Utilities    Threatened with loss of utilities: No  Health Literacy: Not on file    Review of Systems Per HPI  Objective:  BP (!) 142/79 (BP Location: Left Arm)   Pulse 76   Temp (!) 97.1 F (36.2 C)   Ht 5' 8 (1.727 m)   Wt 228 lb 12.8 oz (103.8 kg)   SpO2 98%   BMI 34.79 kg/m      04/05/2024    8:50 AM 03/28/2024    5:43 PM 03/14/2024    1:47 PM  BP/Weight  Systolic BP 142 144 136  Diastolic BP 79 85 77  Wt. (Lbs) 228.8    BMI 34.79 kg/m2      Physical Exam Constitutional:      General: She is not in acute distress.    Appearance: Normal appearance. She is obese.  HENT:     Head: Normocephalic and atraumatic.  Cardiovascular:     Rate and Rhythm: Normal rate and regular rhythm.  Pulmonary:     Effort: Pulmonary effort is normal.     Breath sounds: Normal breath sounds.  Feet:     Comments: Onychomycosis noted to the right great toe. Skin:    Comments: Onychomycosis noted to the left thumb as well.  Neurological:     Mental Status: She is alert.  Psychiatric:        Mood and Affect:  Mood normal.        Behavior: Behavior normal.     Lab Results  Component Value Date   WBC 5.9 10/21/2023   HGB 14.9 10/21/2023   HCT 45.3 10/21/2023   PLT 279 10/21/2023   GLUCOSE  86 07/23/2023   CHOL 160 01/14/2021   TRIG 126 01/14/2021   HDL 79 01/14/2021   LDLCALC 59 01/14/2021   ALT 25 05/13/2023   AST 28 05/13/2023   NA 143 07/23/2023   K 3.9 07/23/2023   CL 103 07/23/2023   CREATININE 1.50 (H) 09/23/2023   BUN 13 07/23/2023   CO2 24 07/23/2023   TSH 8.256 (H) 08/25/2011   INR 1.2 (H) 05/13/2023   HGBA1C 5.8 (H) 08/01/2017     Assessment & Plan:  Recurrent falls Assessment & Plan: Referring for PT.  Orders: -     Ambulatory referral to Physical Therapy  Alcoholic cirrhosis of liver without ascites (HCC) Assessment & Plan: Stable.  Follows with GI.   HFrEF (heart failure with reduced ejection fraction) (HCC)  CKD stage 3b, GFR 30-44 ml/min (HCC) Assessment & Plan: Has been relatively stable.  Will continue to monitor.   Bipolar affective disorder, remission status unspecified (HCC) Assessment & Plan: Stable currently.  Follows with [facility].   Dysequilibrium -     Ambulatory referral to Physical Therapy  Chronic combined systolic and diastolic congestive heart failure [facility]) Assessment & Plan: She is currently euvolemic.  Follows closely with cardiology.  Last EF was severely reduced at 20-25%.   Recurrent UTI Assessment & Plan: Empiric Estrace .  Has appointment with urology.  Orders: -     Estradiol ; 0.5 to 1 g once daily for 2 weeks. Then uses 1 g one to three times per week  Dispense: 42.5 g; Refill: 12  Onychomycosis Assessment & Plan: Not pursuing treatment at this time given the fact that this is cosmetic and she has liver cirrhosis.     Follow-up:  Return in about 3 months (around 07/04/2024).  Jacqulyn Ahle DO [facility] "

## 2024-04-05 NOTE — Assessment & Plan Note (Signed)
 Not pursuing treatment at this time given the fact that this is cosmetic and she has liver cirrhosis.

## 2024-04-07 ENCOUNTER — Encounter: Payer: Self-pay | Admitting: Nurse Practitioner

## 2024-04-07 ENCOUNTER — Ambulatory Visit: Attending: Nurse Practitioner | Admitting: Nurse Practitioner

## 2024-04-07 VITALS — BP 122/70 | HR 74 | Ht 68.0 in | Wt 232.0 lb

## 2024-04-07 DIAGNOSIS — I5020 Unspecified systolic (congestive) heart failure: Secondary | ICD-10-CM

## 2024-04-07 DIAGNOSIS — E669 Obesity, unspecified: Secondary | ICD-10-CM | POA: Diagnosis not present

## 2024-04-07 DIAGNOSIS — I428 Other cardiomyopathies: Secondary | ICD-10-CM

## 2024-04-07 DIAGNOSIS — I6523 Occlusion and stenosis of bilateral carotid arteries: Secondary | ICD-10-CM | POA: Diagnosis not present

## 2024-04-07 DIAGNOSIS — I2510 Atherosclerotic heart disease of native coronary artery without angina pectoris: Secondary | ICD-10-CM

## 2024-04-07 DIAGNOSIS — N1832 Chronic kidney disease, stage 3b: Secondary | ICD-10-CM | POA: Diagnosis not present

## 2024-04-07 DIAGNOSIS — I671 Cerebral aneurysm, nonruptured: Secondary | ICD-10-CM

## 2024-04-07 NOTE — Patient Instructions (Addendum)
 Medication Instructions:   Continue all current medications.   Labwork:  none  Testing/Procedures:  Your physician has requested that you have a limited echocardiogram. Echocardiography is a painless test that uses sound waves to create images of your heart. It provides your doctor with information about the size and shape of your heart and how well your hearts chambers and valves are working. This procedure takes approximately one hour. There are no restrictions for this procedure. Please do NOT wear cologne, perfume, aftershave, or lotions (deodorant is allowed). Please arrive 15 minutes prior to your appointment time.  Please note: We ask at that you not bring children with you during ultrasound (echo/ vascular) testing. Due to room size and safety concerns, children are not allowed in the ultrasound rooms during exams. Our front office staff cannot provide observation of children in our lobby area while testing is being conducted. An adult accompanying a patient to their appointment will only be allowed in the ultrasound room at the discretion of the ultrasound technician under special circumstances. We apologize for any inconvenience.   Follow-Up:  6 months   Any Other Special Instructions Will Be Listed Below (If Applicable).   If you need a refill on your cardiac medications before your next appointment, please call your pharmacy.

## 2024-04-07 NOTE — Progress Notes (Unsigned)
 " Cardiology Office Note:  .   Date: 04/07/2024 ID:  Deanna  Thompson, DOB Sep 24, 1955, MRN [id number] PCP: Bluford Jacqulyn MATSU, DO  [facility] Providers Cardiologist:  Alvan Carrier, MD    History of Present Illness: .   Deanna  Thompson is a 69 y.o. female with a PMH of HFmrEF, coronary calcification, HTN, LBBB, mild aortic stenosis with mild aortic insufficiency, prior cerebral aneurysm, s/p clipping, suspected brainstem stroke in 2019, history of EtOH abuse, anemia with past history of GI bleed, prior gastric AVMs, depression, anxiety, bipolar disorder, Sjogren's syndrome, IBS, alpha gal, mild milk allergy, psoriatic arthritis, CKD stage IIIa, and vertigo, who presents today for follow-up.  Last seen by Dayna Dunn, PA-C on December 04, 2022 for follow-up.  Was found to be overall doing well from a cardiac perspective.  She noted chronic unchanged fatigue, felt to be multifactorial.  Denying chest pain or dyspnea.  Has started walking again at the time.  Noted chronic history of episodic dizziness related to vertigo.  Updated echo revealed EF worsening at 30 to 35%, was 45 to 50%.  Dr. Jordan was consulted about whether to pursue cath for worsening LV function.  Dr. Jordan agreed that she would benefit from heart catheterization, right and left.  Recommended for patient to follow-up in office to discuss.  Recommended that patient touch base with Dr. Dolphus office regarding her previous brain aneurysm.  Seen by Dr. Dolphus on February 16, 2023. He arranged a CT angiogram of head in neck for further evaluation. See results below.   Underwent right and left heart catheterization on April 22, 2023. See results below.   06/29/2023 - Today she presents for follow-up post cardiac cath. Pt says overall she is doing well. Does admit to some weight gain d/t dietary indiscretions. Denies any chest pain, shortness of breath, palpitations, syncope, presyncope, dizziness, orthopnea, PND, swelling or  significant weight changes, acute bleeding, or claudication.  01/18/2024 -here for follow-up and tells me that she had to quit Farxiga  as she had 2 UTIs after starting this medication.  Has been having issues with IBS and recent significant diarrhea. Otherwise doing well from a cardiac standpoint. Denies any chest pain, shortness of breath, palpitations, syncope, presyncope, dizziness, orthopnea, PND, swelling or significant weight changes, acute bleeding, or claudication.  Echo obtained on 02/02/2024 revealed LVEF 20-25%, grade 1 DD, no significant valvular abnormalities.   02/08/2024 - She is here for follow-up based on these results. She is overall doing well. Does wonder what caused her EF to drop low. Denies any chest pain, shortness of breath, palpitations, syncope, presyncope, dizziness, orthopnea, PND, swelling or significant weight changes, acute bleeding, or claudication. Does admit to chronic cough.   04/07/2024 -  She is here for follow-up. Doing well from a cardiac standpoint. Tolerating medications well. Has had more recent UTI symptoms per her report. Denies any chest pain, shortness of breath, palpitations, syncope, presyncope, dizziness, orthopnea, PND, swelling or significant weight changes, acute bleeding, or claudication.  ROS: Negative. See HPI.  SH: Experience in social services, received Master's degree at [employer]. Loves to go thrifting and enjoys crafting.   Studies Reviewed: SABRA    EKG: EKG is not ordered today.   Echo 01/2024:    1. Left ventricular ejection fraction, by estimation, is 20 to 25%. The  left ventricle has severely decreased function. Left ventricular  endocardial border not optimally defined to evaluate regional wall motion.  There is mild left ventricular  hypertrophy. Left ventricular diastolic parameters  are consistent with  Grade I diastolic dysfunction (impaired relaxation).   2. Right ventricular systolic function is normal. The right ventricular  size  is normal. There is normal pulmonary artery systolic pressure.   3. The mitral valve is normal in structure. No evidence of mitral valve  regurgitation. No evidence of mitral stenosis.   4. The aortic valve is tricuspid. Aortic valve regurgitation is not  visualized. No aortic stenosis is present.   5. The inferior vena cava is normal in size with greater than 50%  respiratory variability, suggesting right atrial pressure of 3 mmHg.   Comparison(s): A prior study was performed on 01/14/2023. EF 30-35%. with  RWMA. The entire septum is akinetic. The entire anterior wall an apex are  hyokinetic. Trivial MR and mild TR.   Right/left heart cath 03/2023:  Right & Left Heart Catheterization 04/22/23: Hemodynamic data: Normal right heart pressure, QP/T was 1.00.  CO 4.67/CI 2.16 by Fick. Normal EDP at 13 mmHg.  No pressure gradient across the aortic valve.   Angiographic data: LV: Mildly dilated.  Global hypokinesis.  EF appears to be around 45%.  No significant mitral regurgitation.   Normal coronary arteries, codominant circulation. LM is large LAD: Gives origin to large D1, moderate-sized D2 and D3. LCx: Codominant with RCA.  Large OM 2 and OM 4.  PDA is moderate-sized. RI: Large vessel.  Traverses to the apex. RCA: Codominant with CX.  Small to moderate-sized PDA and PL branch.         Impression and recommendations: Findings consistent with nonischemic cardiomyopathy with normal coronary arteries.  Normal right heart catheterization with preserved cardiac output and index.  Carotid duplex 01/2023: Summary:  Right Carotid: Velocities in the right ICA are consistent with a 1-39%  stenosis. Non-hemodynamically significant plaque <50% noted in the  CCA. The ECA appears <50% stenosed.   Left Carotid: Velocities in the left ICA are consistent with a 1-39%  stenosis. Non-hemodynamically significant plaque <50% noted in the  CCA. The ECA appears <50% stenosed.   Vertebrals:   Bilateral vertebral arteries demonstrate antegrade flow.  Subclavians: Normal flow hemodynamics were seen in bilateral subclavian arteries.   Echo 12/2022: 1. Left ventricular ejection fraction, by estimation, is 30 to 35%. The  left ventricle has moderately decreased function. The left ventricle  demonstrates regional wall motion abnormalities (see scoring  diagram/findings for description). Left ventricular   diastolic parameters are consistent with Grade I diastolic dysfunction  (impaired relaxation).   2. Right ventricular systolic function is normal. The right ventricular  size is normal. There is normal pulmonary artery systolic pressure.   3. The mitral valve is normal in structure. Trivial mitral valve  regurgitation. No evidence of mitral stenosis.   4. The tricuspid valve is abnormal.   5. The aortic valve has an indeterminant number of cusps. There is mild  calcification of the aortic valve. There is mild thickening of the aortic  valve. Aortic valve regurgitation is not visualized. No aortic stenosis is  present.   6. The inferior vena cava is normal in size with greater than 50%  respiratory variability, suggesting right atrial pressure of 3 mmHg.  Lexiscan  11/2017: The left ventricular ejection fraction is mildly decreased (45-54%). Very severe gut radiotracer uptake in the resting and post-injection images, most significant in the post-injection images. Overall perfusion imaging is nondiagnostic. Low LVEF is suggested however looks normal by visual evaluation. Consider correlating with echo  Physical Exam:   VS:  BP 122/70  Pulse 74   Ht 5' 8 (1.727 m)   Wt 232 lb (105.2 kg)   SpO2 97%   BMI 35.28 kg/m    Wt Readings from Last 3 Encounters:  04/07/24 232 lb (105.2 kg)  04/05/24 228 lb 12.8 oz (103.8 kg)  02/21/24 233 lb (105.7 kg)    GEN: Obese, 69 y.o. female in no acute distress NECK: No JVD; No carotid bruits CARDIAC: S1/S2, RRR, no murmurs, rubs,  gallops RESPIRATORY:  Clear to auscultation without rales, wheezing or rhonchi  ABDOMEN: Soft, non-tender, non-distended EXTREMITIES:  No edema; No deformity   ASSESSMENT AND PLAN: .    HFrEF, NICM Stage C, NYHA class I-II symptoms. NICM. Recent right and left heart cath revealed normal coronary arteries. EF approximately 45%,however recent updated echo showed LVEF reduced to 20 to 25%, more than likely from stopping Farxiga . Weight stable. Continue current GDMT. Was unable to tolerate Losartan . Low sodium diet, fluid restriction <2L, and daily weights encouraged. Educated to contact our office for weight gain of 2 lbs overnight or 5 lbs in one week. Care and ED precautions discussed. Will update limited Echo to evaluate her EF in March 2026.   2. Coronary artery calcification Denies any chest pain. Most recent heart cath revealed normal coronary arteries.  Continue current medication regimen. Heart healthy diet encouraged. Care and ED precautions discussed.   3. Carotid artery stenosis Carotid duplex 01/2023 revealed 1-39% bilateral ICA stenosis. Denies any concerning symptoms. Continue current medication regimen.   5. 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 Denies any concerning signs or symptoms. Care and ED precautions discussed.    6. Obesity Weight loss via diet and exercise encouraged. Discussed the impact being overweight would have on cardiovascular risk.  7. CKD stage 3b Most recent labs around her baseline. Could not tolerate Farxiga  - see above. Avoid nephrotoxic agents.  Continue follow-up PCP.   Dispo: Follow-up with MD/APP in 6 months or sooner if anything changes.   Signed, Almarie Crate, NP "

## 2024-04-17 ENCOUNTER — Other Ambulatory Visit: Payer: Self-pay | Admitting: Podiatry

## 2024-04-24 ENCOUNTER — Other Ambulatory Visit: Payer: Self-pay | Admitting: Family Medicine

## 2024-04-24 ENCOUNTER — Ambulatory Visit: Payer: Self-pay

## 2024-04-24 NOTE — Telephone Encounter (Signed)
 They scheduled for her in the morning since we are closed - she refused urgent care but wants it to be virtual if she can't get in

## 2024-04-24 NOTE — Telephone Encounter (Signed)
 Pt reports recent history of frequent UTIs. Just finished estradiol  in an attempt to combat UTIs, symptoms improved, then returned this morning (dysuria, urinary frequency, mild back pain). Pt took a leftover pyridium . Pt is requesting for either a virtual appt today or for a prescription for more pyridium  in the meantime. She was encouraged to seek care at [facility] if no appt can be made.    FYI Only or Action Required?: Action required by provider: clinical question for provider.  Patient was last seen in primary care on 04/05/2024 by Cook, Jayce G, DO.  Called Nurse Triage reporting Recurrent UTI.  Symptoms began today.  Interventions attempted: Prescription medications: pyridium .  Symptoms are: gradually worsening.  Triage Disposition: See HCP Within 4 Hours (Or PCP Triage)  Patient/caregiver understands and will follow disposition?: Yes    Reason for Triage: history of UTI had 4 in the last 3 months. saw dr. bluford - and have been on it for the past 2 weeks. and soon as switched oof UTI came back.   Frequency, burning while urinating. can't pee.   This monring woke up. stopped 2 days ago.  Reason for Disposition  [1] SEVERE pain (e.g., excruciating) AND [2] no improvement 2 hours after pain medications    Took a pyridium  before NT, waiting for it to help.  Answer Assessment - Initial Assessment Questions Recently took a course of estradiol  in an attempt to combat her frequent UTIs, reports it helped, but now that she has completed it she woke with UTI symptoms this AM.   1. MAIN SYMPTOM: What is the main symptom you are concerned about? (e.g., painful urination, urine frequency)     Urinary frequency and can't pee, burning. Took leftover pyridium  this AM. Lower back pain not horrible but it's there.   2. BETTER-SAME-WORSE: Are you getting better, staying the same, or getting worse compared to how you felt at your last visit to the doctor (most recent medical visit)?      Worse  3. PAIN: How bad is the pain?  (e.g., Scale 1-10; mild, moderate, or severe)     9/10 burning with urination  4. FEVER: Do you have a fever? If Yes, ask: What is it, how was it measured, and when did it start?     Denies  5. OTHER SYMPTOMS: Do you have any other symptoms? (e.g., blood in the urine, flank pain, vaginal discharge)     Back pain  6. DIAGNOSIS: When was the UTI diagnosed? By whom? Was it a kidney infection, bladder infection or both?     Reports she's had 4 UTIs in the last 5-6 weeks  7. ANTIBIOTIC: What antibiotic(s) are you taking? How many times per day?     No abx, just finished estradiol   Protocols used: Urinary Tract Infection on Antibiotic Follow-up Call - [facility]

## 2024-04-25 ENCOUNTER — Ambulatory Visit: Admitting: Family Medicine

## 2024-04-25 ENCOUNTER — Ambulatory Visit (INDEPENDENT_AMBULATORY_CARE_PROVIDER_SITE_OTHER): Admitting: Family Medicine

## 2024-04-25 ENCOUNTER — Encounter: Payer: Self-pay | Admitting: Family Medicine

## 2024-04-25 DIAGNOSIS — N390 Urinary tract infection, site not specified: Secondary | ICD-10-CM

## 2024-04-25 MED ORDER — SULFAMETHOXAZOLE-TRIMETHOPRIM 800-160 MG PO TABS: 1.0000 | ORAL_TABLET | Freq: Two times a day (BID) | ORAL | 0 refills | Status: AC

## 2024-04-25 MED ORDER — PHENAZOPYRIDINE HCL 200 MG PO TABS: 200.0000 mg | ORAL_TABLET | Freq: Three times a day (TID) | ORAL | 0 refills | Status: AC | PRN

## 2024-04-25 NOTE — Assessment & Plan Note (Signed)
 Will send urine for culture.  Empiric antibiotic therapy.

## 2024-04-25 NOTE — Progress Notes (Signed)
 "  Subjective:  Patient ID: Deanna  Thompson, female    DOB: May 20, 1955  Age: 69 y.o. MRN: [id number]  CC:   UTI  HPI:  69 year old female presents for evaluation of the above.  Patient has history of recurrent UTI.  She reports symptoms over the past couple of days.  She reports urinary frequency and dysuria.  Some low back pain.  No documented fever.  Has been using Pyridium .  Previous cultures have revealed mostly E. coli but she has also had Pseudomonas.   Patient Active Problem List   Diagnosis Date Noted   Recurrent falls 04/05/2024   Recurrent UTI 04/05/2024   Onychomycosis 04/05/2024   Chronic CHF (congestive heart failure) (HCC) 06/16/2022   Incisional hernia, without obstruction or gangrene 11/10/2021   Bipolar disorder (HCC) 01/13/2021   Hyperlipidemia 01/13/2021   IBS (irritable bowel syndrome)    History of stroke 08/01/2017   Essential hypertension 07/31/2017   GERD (gastroesophageal reflux disease) 07/31/2017   Brain aneurysm 07/31/2017   CKD stage 3b, GFR 30-44 ml/min (HCC) 07/31/2017   LBBB (left bundle branch block) 07/31/2017   Cirrhosis of liver (HCC) 09/19/2014   Iron  deficiency anemia due to chronic blood loss    Insomnia 03/17/2014    Social Hx   Social History   Socioeconomic History   Marital status: Married    Spouse name: Transport Planner   Number of children: 0   Years of education: Not on file   Highest education level: Master's degree (e.g., MA, MS, MEng, MEd, MSW, MBA)  Occupational History    Employer: RETIRED  Tobacco Use   Smoking status: Former    Current packs/day: 0.00    Average packs/day: 2.0 packs/day for 12.0 years (24.0 ttl pk-yrs)    Types: Cigarettes    Start date: 03/31/1983    Quit date: 03/31/1995    Years since quitting: 29.0   Smokeless tobacco: Never   Tobacco comments:    quit in 1997  Vaping Use   Vaping status: Never Used  Substance and Sexual Activity   Alcohol  use: No    Comment: Bottle of wine a day for years, but  quit in 07/2011   Drug use: No   Sexual activity: Not Currently    Birth control/protection: Post-menopausal  Other Topics Concern   Not on file  Social History Narrative   Lives   Caffeine use:    Quit drinking alcohol  3 YEARS ago. No drugs, no IVDU.    Social Drivers of Health   Tobacco Use: Medium Risk (04/25/2024)   Patient History    Smoking Tobacco Use: Former    Smokeless Tobacco Use: Never    Passive Exposure: Not on file  Financial Resource Strain: Low Risk (02/21/2024)   Overall Financial Resource Strain (CARDIA)    Difficulty of Paying Living Expenses: Not hard at all  Food Insecurity: No Food Insecurity (02/21/2024)   Epic    Worried About Programme Researcher, Broadcasting/film/video in the Last Year: Never true    Ran Out of Food in the Last Year: Never true  Transportation Needs: No Transportation Needs (02/21/2024)   Epic    Lack of Transportation (Medical): No    Lack of Transportation (Non-Medical): No  Physical Activity: Insufficiently Active (02/21/2024)   Exercise Vital Sign    Days of Exercise per Week: 2 days    Minutes of Exercise per Session: 20 min  Stress: Stress Concern Present (02/21/2024)   [employer] of Occupational Health - Occupational Stress Questionnaire  Feeling of Stress: To some extent  Social Connections: Moderately Isolated (02/21/2024)   Social Connection and Isolation Panel    Frequency of Communication with Friends and Family: More than three times a week    Frequency of Social Gatherings with Friends and Family: Once a week    Attends Religious Services: Never    Database Administrator or Organizations: No    Attends Engineer, Structural: Not on file    Marital Status: Married  Depression (PHQ2-9): Low Risk (04/25/2024)   Depression (PHQ2-9)    PHQ-2 Score: 0  Recent Concern: Depression (PHQ2-9) - Medium Risk (02/21/2024)   Depression (PHQ2-9)    PHQ-2 Score: 8  Alcohol  Screen: Low Risk (07/21/2022)   Alcohol  Screen    Last Alcohol   Screening Score (AUDIT): 0  Housing: Low Risk (02/21/2024)   Epic    Unable to Pay for Housing in the Last Year: No    Number of Times Moved in the Last Year: 0    Homeless in the Last Year: No  Utilities: Not At Risk (07/21/2022)   [facility] Utilities    Threatened with loss of utilities: No  Health Literacy: Not on file    Review of Systems Per HPI  Objective:  BP 104/70   Pulse 77   Temp 97.7 F (36.5 C)   Ht 5' 8 (1.727 m)   Wt 232 lb (105.2 kg)   SpO2 97%   BMI 35.28 kg/m      04/25/2024   10:12 AM 04/07/2024    3:48 PM 04/05/2024    8:50 AM  BP/Weight  Systolic BP 104 122 142  Diastolic BP 70 70 79  Wt. (Lbs) 232 232 228.8  BMI 35.28 kg/m2 35.28 kg/m2 34.79 kg/m2    Physical Exam Vitals and nursing note reviewed.  Constitutional:      General: She is not in acute distress.    Appearance: Normal appearance.  HENT:     Head: Normocephalic and atraumatic.  Cardiovascular:     Rate and Rhythm: Normal rate and regular rhythm.  Pulmonary:     Effort: Pulmonary effort is normal. No respiratory distress.  Neurological:     Mental Status: She is alert.  Psychiatric:        Mood and Affect: Mood normal.        Behavior: Behavior normal.     Lab Results  Component Value Date   WBC 5.9 10/21/2023   HGB 14.9 10/21/2023   HCT 45.3 10/21/2023   PLT 279 10/21/2023   GLUCOSE 86 07/23/2023   CHOL 160 01/14/2021   TRIG 126 01/14/2021   HDL 79 01/14/2021   LDLCALC 59 01/14/2021   ALT 25 05/13/2023   AST 28 05/13/2023   NA 143 07/23/2023   K 3.9 07/23/2023   CL 103 07/23/2023   CREATININE 1.50 (H) 09/23/2023   BUN 13 07/23/2023   CO2 24 07/23/2023   TSH 8.256 (H) 08/25/2011   INR 1.2 (H) 05/13/2023   HGBA1C 5.8 (H) 08/01/2017     Assessment & Plan:  Recurrent UTI Assessment & Plan: Will send urine for culture.  Empiric antibiotic therapy.  Orders: -     POCT URINALYSIS DIP (CLINITEK) -     Sulfamethoxazole -Trimethoprim ; Take 1 tablet by mouth 2 (two)  times daily.  Dispense: 14 tablet; Refill: 0 -     Phenazopyridine  HCl; Take 1 tablet (200 mg total) by mouth 3 (three) times daily as needed for pain.  Dispense: 6  tablet; Refill: 0 -     Urine Culture -     Ambulatory referral to Urology    Follow-up:  As previously scheduled  Kimbly Eanes DO [facility] "

## 2024-04-27 ENCOUNTER — Telehealth: Payer: Self-pay

## 2024-04-27 NOTE — Telephone Encounter (Signed)
 Urine has been processed and sent off to lab

## 2024-04-27 NOTE — Telephone Encounter (Signed)
 Patient dropped of urine placed in lab area

## 2024-04-29 ENCOUNTER — Ambulatory Visit: Payer: Self-pay | Admitting: Family Medicine

## 2024-04-29 LAB — URINE CULTURE

## 2024-05-02 ENCOUNTER — Inpatient Hospital Stay

## 2024-05-02 DIAGNOSIS — D500 Iron deficiency anemia secondary to blood loss (chronic): Secondary | ICD-10-CM

## 2024-05-02 LAB — CBC WITH DIFFERENTIAL/PLATELET
Abs Immature Granulocytes: 0.04 10*3/uL (ref 0.00–0.07)
Basophils Absolute: 0.1 10*3/uL (ref 0.0–0.1)
Basophils Relative: 1 %
Eosinophils Absolute: 0.1 10*3/uL (ref 0.0–0.5)
Eosinophils Relative: 2 %
HCT: 43.7 % (ref 36.0–46.0)
Hemoglobin: 14.6 g/dL (ref 12.0–15.0)
Immature Granulocytes: 1 %
Lymphocytes Relative: 20 %
Lymphs Abs: 1.5 10*3/uL (ref 0.7–4.0)
MCH: 32.4 pg (ref 26.0–34.0)
MCHC: 33.4 g/dL (ref 30.0–36.0)
MCV: 97.1 fL (ref 80.0–100.0)
Monocytes Absolute: 0.7 10*3/uL (ref 0.1–1.0)
Monocytes Relative: 9 %
Neutro Abs: 5.1 10*3/uL (ref 1.7–7.7)
Neutrophils Relative %: 67 %
Platelets: 353 10*3/uL (ref 150–400)
RBC: 4.5 MIL/uL (ref 3.87–5.11)
RDW: 13.7 % (ref 11.5–15.5)
WBC: 7.4 10*3/uL (ref 4.0–10.5)
nRBC: 0 % (ref 0.0–0.2)

## 2024-05-02 LAB — IRON AND TIBC
Iron: 139 ug/dL (ref 28–170)
Saturation Ratios: 35 % — ABNORMAL HIGH (ref 10.4–31.8)
TIBC: 400 ug/dL (ref 250–450)
UIBC: 261 ug/dL

## 2024-05-02 LAB — FERRITIN: Ferritin: 162 ng/mL (ref 11–307)

## 2024-05-04 NOTE — Progress Notes (Unsigned)
 "  VIRTUAL VISIT via TELEPHONE NOTE [facility]   I connected with Sugar  Alyea  on *** at  *** by telephone and verified that I am speaking with the correct person using two identifiers.  Location: Patient: Home Provider: [facility]   I discussed the limitations, risks, security and privacy concerns of performing an evaluation and management service by telephone and the availability of in person appointments. I also discussed with the patient that there may be a patient responsible charge related to this service. The patient expressed understanding and agreed to proceed.  REASON FOR VISIT: Iron  deficiency anemia (bleeding AVMs)   CURRENT THERAPY: Intermittent IV iron   INTERVAL HISTORY:  [name] is seen in clinic today for follow-up of her iron  deficiency anemia.  She was last seen by Pleasant Barefoot PA-C on 11/02/2023.     At today's visit, she reports feeling fairly well.*** She has 25***% energy and 100***% appetite.  She endorses that she is maintaining a stable weight. ***  She does report some increased fatigue and is starting to chew ice again.*** She also reports mild dyspnea on exertion.***   She denies any restless legs, headaches, chest pain, lightheadedness, or syncope.  *** She has a history of bleeding AVMs, but has not had any recent hematemesis, hematochezia, or melena.***  ASSESSMENT & PLAN:  1.  Iron  deficiency anemia due to chronic blood loss - Due to chronic GI blood loss. - Patient was hospitalized in December 2020 for an AVM bleed where her hemoglobin dropped to 7.7. 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  Injected.  Treated with bipolar cautery.  Clips were placed. - EGD (06/23/2023): Salmon-colored mucosa (biopsied, squamocolumnar junction with chronic inflammation and no intestinal metaplasia, dysplasia, or malignancy identified).  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  Duodenal polyp (peptic duodenitis, polypoid.  No dysplasia or malignancy.). - She has been transfused RBCs in the past - Most recent IV iron  with Venofer  400 mg x 30 October 2023 - No bright red blood per rectum or melena*** - Most recent labs (05/02/2024): Hgb 14.6/MCV 97.1, ferritin 162, iron  saturation 35% - PLAN: No indication for IV iron  at this time. - Labs and RTC in 6 months with office visit, but patient encouraged to return sooner if she has any symptoms of recurrent IDA prior to her next visit.   2.  Other history - Her other pertinent medical history includes CKD stage IIIa,  liver cirrhosis, history of cerebral aneurysm with stroke, hyperlipidemia, and hypertension     PLAN SUMMARY: >> Labs in 6 months = CBC/D, ferritin, iron /TIBC >> OFFICE visit in 6 months   ** Last office visit 11/02/23      REVIEW OF SYSTEMS: ***  ROS   PHYSICAL EXAM: (per limitations of virtual telephone visit)  The patient is alert and oriented x 3, exhibiting adequate mentation, good mood, and ability to speak in full sentences and execute sound judgement.***  WRAP UP: ***  I discussed the assessment and treatment plan with the patient. The patient was provided an opportunity to ask questions and all were answered. The patient agreed with the plan and demonstrated an understanding of the instructions.   The patient was advised to call back or seek an in-person evaluation if the symptoms worsen or if the condition fails to improve as anticipated.  I provided *** minutes of non-face-to-face  time during this encounter.  Pleasant CHRISTELLA Barefoot, PA-C *** "

## 2024-05-04 NOTE — Therapy (Signed)
 " OUTPATIENT PHYSICAL THERAPY LOWER EXTREMITY EVALUATION   Patient Name: Deanna Thompson MRN: [id number] DOB:23-Jun-1955, 69 y.o., female Today's Date: 05/05/2024  END OF SESSION:  PT End of Session - 05/05/24 1110     Visit Number 1    Number of Visits 12    Date for Recertification  06/02/24    Authorization Type [employer]    Authorization Time Period no auth needed    PT Start Time 1110    PT Stop Time 1150    PT Time Calculation (min) 40 min    Equipment Utilized During Treatment Gait belt    Activity Tolerance Patient tolerated treatment well    Behavior During Therapy WFL for tasks assessed/performed          Past Medical History:  Diagnosis Date   Abdominal hernia    repaired   Abscess of pulp of tooth 12/12/2014   left side   Anemia    Anxiety    Arthritis    back  and down flank  right side more than left   B12 deficiency 06/12/2015   Brain aneurysm    METAL COIL PREVENTS MRI IMAGING and CLIP   Cirrhosis (HCC)    Presumably alcoholic cirrhosis   Cirrhosis (HCC)    Complication of anesthesia    Depression    ETOH abuse    Quit in 07/2011    Fatigue    due to Sjogren's disease   Flat foot (pes planus) (acquired), left foot    Folate deficiency 06/30/2016   GERD (gastroesophageal reflux disease)    HTN (hypertension)    no longer medicated, dated on 04/25/12   IBS (irritable bowel syndrome)    Iron  deficiency anemia due to chronic blood loss    Myofascial pain    Panic attack    PONV (postoperative nausea and vomiting)    Psoriatic arthritis (HCC)    Right inguinal hernia 08/16/2012   Seizures (HCC) 1997   1 seizure from coil insertion but no more seizures and no meds for seizures   Sjogren's disease    soft Bipolar disorder (HCC)    Stroke (HCC) 2019   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   Qualifier: Diagnosis of  By: Margrette MD, [place]     TIA (transient ischemic attack)    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Vertigo    Past  Surgical History:  Procedure Laterality Date   AGILE CAPSULE N/A 06/21/2014   Procedure: AGILE CAPSULE;  Surgeon: Margo LITTIE Haddock, MD;  [facility]: [facility];  Service: Endoscopy;  Laterality: N/A;  0700   BIOPSY  10/13/2011   SLF: mild gastritis/duodenal polypoid lesion    BIOPSY  06/04/2020   Procedure: BIOPSY;  Surgeon: Cindie Carlin POUR, DO;  [facility]: [facility];  Service: Endoscopy;;   CEREBRAL ANEURYSM REPAIR  1997   CHOLECYSTECTOMY N/A 10/28/2018   Procedure: LAPAROSCOPIC CHOLECYSTECTOMY WITH INTRAOPERATIVE CHOLANGIOGRAM;  Surgeon: Kallie Manuelita BROCKS, MD;  Location: [facility];  Service: General;  Laterality: N/A;   COLONOSCOPY  10/13/2011   DOQ:[id number] hemorrhoids/varices rectal/lesion in ascending colon(HYPERPLASTIC POLYP)   COLONOSCOPY N/A 01/17/2013   SLF: 1. Internal hemorrhoids 2. Ascending colon polyps 3.  Rectal varices.    COLONOSCOPY N/A 08/10/2016   Dr. Haddock: redundant left colon. external and internal hemorhroids. 5-10 year surveillance   ESOPHAGOGASTRODUODENOSCOPY N/A 06/29/2014   SLF: 1. anemia due to polypoid lesions in teh antrum, and duodenal polyps 2. single duodenum AVM   ESOPHAGOGASTRODUODENOSCOPY (EGD) WITH PROPOFOL  N/A  03/07/2019   Procedure: ESOPHAGOGASTRODUODENOSCOPY (EGD) WITH PROPOFOL ;  Surgeon: Harvey Margo CROME, MD;  [facility];  Service: Endoscopy;  Laterality: N/A;  1:00pm   ESOPHAGOGASTRODUODENOSCOPY (EGD) WITH PROPOFOL  N/A 03/16/2019   Procedure: ESOPHAGOGASTRODUODENOSCOPY (EGD) WITH PROPOFOL ;  Surgeon: Harvey Margo CROME, MD;  [facility];  Service: Endoscopy;  Laterality: N/A;   ESOPHAGOGASTRODUODENOSCOPY (EGD) WITH PROPOFOL  N/A 06/04/2020   Procedure: ESOPHAGOGASTRODUODENOSCOPY (EGD) WITH PROPOFOL ;  Surgeon: Cindie Carlin POUR, DO;  [facility];  Service: Endoscopy;  Laterality: N/A;  am   FLEXIBLE SIGMOIDOSCOPY N/A 01/21/2016   Procedure: FLEXIBLE SIGMOIDOSCOPY;  Surgeon: Margo CROME Harvey, MD;  [facility];   Service: Endoscopy;  Laterality: N/A;  245   GIVENS CAPSULE STUDY N/A 06/29/2014   Procedure: GIVENS CAPSULE STUDY;  Surgeon: Margo CROME Harvey, MD;  [facility];  Service: Endoscopy;  Laterality: N/A;   GIVENS CAPSULE STUDY N/A 07/13/2014   Procedure: GIVENS CAPSULE STUDY;  Surgeon: Margo CROME Harvey, MD;  [facility];  Service: Endoscopy;  Laterality: N/A;  700   HEMORRHOID BANDING N/A 01/21/2016   Procedure: HEMORRHOID BANDING;  Surgeon: Margo CROME Harvey, MD;  [facility];  Service: Endoscopy;  Laterality: N/A;   HEMOSTASIS CLIP PLACEMENT  03/16/2019   Procedure: HEMOSTASIS CLIP PLACEMENT;  Surgeon: Harvey Margo CROME, MD;  [facility];  Service: Endoscopy;;  x 4   HEMOSTASIS CONTROL  03/16/2019   Procedure: HEMOSTASIS CONTROL;  Surgeon: Harvey Margo CROME, MD;  [facility];  Service: Endoscopy;;  injection with epinephine 0.1mg /ml via scope and bipolar cautery with gold probe   INGUINAL HERNIA REPAIR Right 08/28/2013   Procedure: RIGHT NGUINAL HERNIORRHAPHY WITH MESH;  Surgeon: Oneil DELENA Budge, MD;  Location: [facility];  Service: General;  Laterality: Right;   INSERTION OF MESH N/A 06/09/2013   Procedure: INSERTION OF MESH;  Surgeon: Oneil DELENA Budge, MD;  Location: [facility];  Service: General;  Laterality: N/A;   INSERTION OF MESH Right 08/28/2013   Procedure: INSERTION OF MESH;  Surgeon: Oneil DELENA Budge, MD;  Location: [facility];  Service: General;  Laterality: Right;   IR RADIOLOGIST EVAL & MGMT  02/17/2023   PARACENTESIS  Feb 2014   10.7 liters   RIGHT/LEFT HEART CATH AND CORONARY ANGIOGRAPHY N/A 04/22/2023   Procedure: RIGHT/LEFT HEART CATH AND CORONARY ANGIOGRAPHY;  Surgeon: Ladona Heinz, MD;  [facility];  Service: Cardiovascular;  Laterality: N/A;   TOOTH EXTRACTION Left 12/12/14   UMBILICAL HERNIA REPAIR N/A 06/09/2013   Procedure: UMBILICAL HERNIORRHAPHY WITH MESH;  Surgeon: Oneil DELENA Budge, MD;  Location: [facility];  Service: General;   Laterality: N/A;   Patient Active Problem List   Diagnosis Date Noted   Recurrent falls 04/05/2024   Recurrent UTI 04/05/2024   Onychomycosis 04/05/2024   Chronic CHF (congestive heart failure) (HCC) 06/16/2022   Incisional hernia, without obstruction or gangrene 11/10/2021   Bipolar disorder (HCC) 01/13/2021   Hyperlipidemia 01/13/2021   IBS (irritable bowel syndrome)    History of stroke 08/01/2017   Essential hypertension 07/31/2017   GERD (gastroesophageal reflux disease) 07/31/2017   Brain aneurysm 07/31/2017   CKD stage 3b, GFR 30-44 ml/min (HCC) 07/31/2017   LBBB (left bundle branch block) 07/31/2017   Cirrhosis of liver (HCC) 09/19/2014   Iron  deficiency anemia due to chronic blood loss    Insomnia 03/17/2014    PCP: Cook, Jayce G, DO  REFERRING PROVIDER:Cook, Jayce G, DO  REFERRING DIAG: R29.6 (ICD-10-CM) - Recurrent falls R42 (ICD-10-CM) - Dysequilibrium  THERAPY DIAG:  Repeated falls - Plan: PT plan of care cert/re-cert  Muscle weakness (generalized) - Plan: PT plan of care cert/re-cert  Difficulty in walking, not elsewhere classified  Abnormality of gait due to impairment of balance  Rationale for Evaluation and Treatment: Rehabilitation  ONSET DATE: past year  SUBJECTIVE:   SUBJECTIVE STATEMENT: Has had several falls but does not feel dizzy; has had a few tripping incidents; feels awkward; sometimes listing to the side; denies neuropathy; history of some depth perception impairment from old mini stroke; history of vertigo; feels weak in her legs  PERTINENT HISTORY: History of vertigo; no episodes in a couple of years Psoriatic arthritis Anxiety TIA's Cirrhosis Bilateral pes planus   PAIN:  Are you having pain? Yes: NPRS scale: 2-8/10 Pain location: shoulders, hands and left foot Pain description: aching Aggravating factors: driving Relieving factors:    PRECAUTIONS: Fall  RED FLAGS: None   WEIGHT BEARING RESTRICTIONS: No  FALLS:   Has patient fallen in last 6 months? Yes. Number of falls 4   OCCUPATION: retired; does therapist, nutritional as a side job  PLOF: Independent  PATIENT GOALS: not to fall anymore; less awkward  NEXT MD VISIT: PRN  OBJECTIVE:  Note: Objective measures were completed at Evaluation unless otherwise noted.  DIAGNOSTIC FINDINGS:   PATIENT SURVEYS:  ABC scale: The Activities-Specific Balance Confidence (ABC) Scale 0% 10 20 30  40 50 60 70 80 90 100% No confidence<->completely confident  How confident are you that you will not lose your balance or become unsteady when you . . .   Date tested 05/05/2024                                                  Total: #/16 1070 / 1600 = 66.9 %     COGNITION: Overall cognitive status: Within functional limits for tasks assessed     SENSATION: Appears WFL  EDEMA:  Some swelling in left foot  MUSCLE LENGTH: POSTURE: rounded shoulders and forward head  PALPATION:   LOWER EXTREMITY ROM:  Active ROM Right eval Left eval  Hip flexion    Hip extension    Hip abduction    Hip adduction    Hip internal rotation    Hip external rotation    Knee flexion    Knee extension    Ankle dorsiflexion    Ankle plantarflexion    Ankle inversion    Ankle eversion     (Blank rows = not tested)  LOWER EXTREMITY MMT:  MMT Right eval Left eval  Hip flexion 4 4+  Hip extension    Hip abduction    Hip adduction    Hip internal rotation    Hip external rotation    Knee flexion    Knee extension 5 5  Ankle dorsiflexion 4+ 4+  Ankle plantarflexion    Ankle inversion    Ankle eversion     (Blank rows = not tested)   FUNCTIONAL TESTS:  SLS unable either side 5 times sit to stand: 22.87 sec hands on thighs Dynamic Gait Index: Dynamic Gait Index  Mark the lowest level that applies.  DGI 1. Gait level surface (2) Mild Impairment: Walks 20', uses assistive devices, slower speed, mild gait deviations. 2. Change in gait speed (2)  Mild Impairment: Is able to change speed but demonstrates mild gait deviations, or not gait deviations but unable to achieve a significant change in velocity, or uses an assistive device. 3. Gait with horizontal head turns (2) Mild Impairment: Performs head turns smoothly with slight change in gait velocity, i.e., minor disruption to smooth gait path or uses walking aid. 4. Gait with vertical head turns (2) Mild Impairment: Performs head turns smoothly with slight change in gait velocity, i.e., minor disruption to smooth gait path or uses walking aid. 5. Gait and pivot turn (2) Mild Impairment: Pivot turns safely in > 3 seconds and stops with no loss of balance. 6. Step over obstacle (1) Moderate Impairment: Is able to step over box but must stop, then step over. May require verbal cueing. 7. Step around obstacles (2) Mild Impairment: Is able to step around both cones, but must slow down and adjust steps to clear cones. 8. Stairs (1) Moderate Impairment: Two feet to a stair, must use rail.  TOTAL SCORE: 14 / 24  Score Interpretation: Score of <19 indicates high risk of falls.  Minimally Clinically Important Difference (MCID):  =DGI scores of<21/24 = 1.80 points DGI scores of >21/24 = 0.60 points   Duson T, Inbar-Borovsky N, Brozgol M, Giladi N, [place] JM. The Dynamic Gait Index in healthy older adults: the role of stair climbing, fear of falling and gender. Gait Posture. 2009 Feb;29(2):237-41. doi: 10.1016/j.gaitpost.2008.08.013. Epub 2008 Oct 8. PMID: 81154560; PMCID: EFR7290501.  Pardasaney, MYRTIS LOIS Bonus, GEANNIE POUR., et al. (2012). Sensitivity to change and responsiveness of four balance measures for community-dwelling older adults. Physical therapy 92(3): 388-397.   GAIT: Distance walked: 100 ft in clinic Assistive device utilized: None Level of assistance: Modified independence and SBA Comments: right foot externally rotates; increased trunk lateral sway                                                                                                                                 TREATMENT DATE: 05/05/2024    PATIENT EDUCATION:  Education details: Patient educated on exam findings, POC, scope of PT, HEP, and what to expect next visit. Person educated: Patient Education method: Explanation, Demonstration, and Handouts Education comprehension: verbalized understanding, returned demonstration, verbal cues required, and tactile cues required  HOME EXERCISE PROGRAM: Access Code: [email or web address] URL: [email or web address] Date: 05/05/2024 Prepared by: [facility]  Exercises - Sit to Stand  - 2 x daily - 7 x weekly - 1 sets - 10 reps - standing single leg balance at the counter (try to not hold on)  - 2 x daily - 7 x weekly - 1 sets - 10 reps  ASSESSMENT:  CLINICAL IMPRESSION: Patient is a 69 y.o. female who was seen today for physical therapy evaluation and treatment for R29.6 (ICD-10-CM) - Recurrent falls R42 (ICD-10-CM) - Dysequilibrium. Patient demonstrates decreased strength, balance deficits and gait abnormalities which are negatively impacting patient ability to perform ADLs  and functional mobility tasks. Patient will benefit from skilled physical therapy services to address these deficits to improve level of function with ADLs, functional mobility tasks, and reduce risk for falls.    OBJECTIVE IMPAIRMENTS: Abnormal gait, decreased activity tolerance, decreased balance, difficulty walking, decreased strength, impaired perceived functional ability, and pain.   ACTIVITY LIMITATIONS: bending, standing, squatting, stairs, and locomotion level  PARTICIPATION LIMITATIONS: meal prep, cleaning, laundry, shopping, and community activity  REHAB POTENTIAL: Excellent  CLINICAL DECISION MAKING: Evolving/moderate complexity  EVALUATION COMPLEXITY: Moderate   GOALS: Goals reviewed with patient? No  SHORT TERM GOALS: Target date: 05/26/2024 patient will be  independent with initial HEP and compliant with HEP 3-4 times a week   Baseline: Goal status: INITIAL  2.  Patient will report 50% improvement overall  Baseline:  Goal status: INITIAL   LONG TERM GOALS: Target date: 06/16/2024  Patient will be independent in self management strategies to improve quality of life and functional outcomes.  Baseline:  Goal status: INITIAL  2.  Patient will report 70% improvement overall  Baseline:  Goal status: INITIAL  3.  Patient will improve 5 times sit to stand score to 15 sec or less to demonstrate improved functional mobility and increased leg strength.    Baseline: 22.87 sec with hands on thighs Goal status: INITIAL  4.  Patient will improve DGI score to 17/24 or better to demonstrate decreased fall risk with functional activities Baseline: 14/34 Goal status: INITIAL  5.  Patient will able to stand SLS on each leg x 5 to demonstrate improved functional balance  Baseline: unable Goal status: INITIAL  6.  Patient will remain free of falls Baseline:  Goal status: INITIAL   PLAN:  PT FREQUENCY: 2x/week  PT DURATION: 6 weeks  PLANNED INTERVENTIONS: [id number]- PT Re-evaluation, 97110-Therapeutic exercises, 97530- Therapeutic activity, 97112- Neuromuscular re-education, 97535- Self Care, [id number]- Manual therapy, [id number]- Gait training, [phone]- Orthotic Fit/training, [phone]- Canalith repositioning, [id number]- Aquatic Therapy, [phone]- Splinting, [phone]- Wound care (first 20 sq cm), 97598- Wound care (each additional 20 sq cm)Patient/Family education, Balance training, Stair training, Taping, Dry Needling, Joint mobilization, Joint manipulation, Spinal manipulation, Spinal mobilization, Scar mobilization, and DME instructions.   PLAN FOR NEXT SESSION: Review HEP and goals; gait and balance training; lower extremity strengthening; please check hip extension and abduction MMT next visit  11:53 AM, 05/05/24 Belvin Gauss Small Meridith Romick MPT [place] [facility] [place] [phone] Ph:[phone]   "

## 2024-05-05 ENCOUNTER — Other Ambulatory Visit: Payer: Self-pay

## 2024-05-05 ENCOUNTER — Ambulatory Visit (HOSPITAL_COMMUNITY)

## 2024-05-05 DIAGNOSIS — R262 Difficulty in walking, not elsewhere classified: Secondary | ICD-10-CM

## 2024-05-05 DIAGNOSIS — M6281 Muscle weakness (generalized): Secondary | ICD-10-CM

## 2024-05-05 DIAGNOSIS — R296 Repeated falls: Secondary | ICD-10-CM

## 2024-05-05 DIAGNOSIS — R2689 Other abnormalities of gait and mobility: Secondary | ICD-10-CM

## 2024-05-08 ENCOUNTER — Inpatient Hospital Stay: Admitting: Physician Assistant

## 2024-05-08 ENCOUNTER — Ambulatory Visit: Admitting: Podiatry

## 2024-05-09 ENCOUNTER — Ambulatory Visit (HOSPITAL_COMMUNITY)

## 2024-05-09 ENCOUNTER — Inpatient Hospital Stay: Admitting: Physician Assistant

## 2024-05-10 ENCOUNTER — Ambulatory Visit (HOSPITAL_COMMUNITY)

## 2024-05-11 ENCOUNTER — Ambulatory Visit (HOSPITAL_COMMUNITY)

## 2024-05-16 ENCOUNTER — Ambulatory Visit (HOSPITAL_COMMUNITY)

## 2024-05-18 ENCOUNTER — Ambulatory Visit (HOSPITAL_COMMUNITY)

## 2024-05-22 ENCOUNTER — Ambulatory Visit (HOSPITAL_COMMUNITY)

## 2024-05-25 ENCOUNTER — Ambulatory Visit (HOSPITAL_COMMUNITY)

## 2024-05-26 ENCOUNTER — Ambulatory Visit (HOSPITAL_COMMUNITY)

## 2024-05-29 ENCOUNTER — Ambulatory Visit

## 2024-05-30 ENCOUNTER — Ambulatory Visit (HOSPITAL_COMMUNITY)

## 2024-06-01 ENCOUNTER — Ambulatory Visit (HOSPITAL_COMMUNITY)

## 2024-06-05 ENCOUNTER — Ambulatory Visit (HOSPITAL_COMMUNITY)

## 2024-06-08 ENCOUNTER — Ambulatory Visit (HOSPITAL_COMMUNITY)

## 2024-06-12 ENCOUNTER — Ambulatory Visit (HOSPITAL_COMMUNITY)

## 2024-06-19 ENCOUNTER — Ambulatory Visit: Admitting: Urology

## 2024-07-04 ENCOUNTER — Ambulatory Visit: Admitting: Family Medicine
